# Patient Record
Sex: Male | Born: 1945 | Race: White | Hispanic: No | Marital: Married | Smoking: Former smoker
Health system: Southern US, Community
[De-identification: ages and names within clinical notes are randomized; demographics above are authoritative.]

## PROBLEM LIST (undated history)

## (undated) DIAGNOSIS — E119 Type 2 diabetes mellitus without complications: Secondary | ICD-10-CM

## (undated) DIAGNOSIS — I5022 Chronic systolic (congestive) heart failure: Secondary | ICD-10-CM

## (undated) DIAGNOSIS — Z95810 Presence of automatic (implantable) cardiac defibrillator: Secondary | ICD-10-CM

## (undated) DIAGNOSIS — Z789 Other specified health status: Secondary | ICD-10-CM

## (undated) DIAGNOSIS — K759 Inflammatory liver disease, unspecified: Secondary | ICD-10-CM

## (undated) DIAGNOSIS — J189 Pneumonia, unspecified organism: Secondary | ICD-10-CM

## (undated) DIAGNOSIS — R011 Cardiac murmur, unspecified: Secondary | ICD-10-CM

## (undated) DIAGNOSIS — H40009 Preglaucoma, unspecified, unspecified eye: Secondary | ICD-10-CM

## (undated) DIAGNOSIS — I10 Essential (primary) hypertension: Secondary | ICD-10-CM

## (undated) DIAGNOSIS — I509 Heart failure, unspecified: Secondary | ICD-10-CM

## (undated) DIAGNOSIS — I739 Peripheral vascular disease, unspecified: Secondary | ICD-10-CM

## (undated) DIAGNOSIS — M1990 Unspecified osteoarthritis, unspecified site: Secondary | ICD-10-CM

## (undated) DIAGNOSIS — I2510 Atherosclerotic heart disease of native coronary artery without angina pectoris: Secondary | ICD-10-CM

## (undated) DIAGNOSIS — C679 Malignant neoplasm of bladder, unspecified: Secondary | ICD-10-CM

---

## 2006-05-23 ENCOUNTER — Ambulatory Visit (HOSPITAL_COMMUNITY): Admission: RE | Admit: 2006-05-23 | Discharge: 2006-05-23 | Payer: Self-pay | Admitting: Gastroenterology

## 2010-07-26 ENCOUNTER — Other Ambulatory Visit: Payer: Self-pay

## 2010-07-26 ENCOUNTER — Other Ambulatory Visit (INDEPENDENT_AMBULATORY_CARE_PROVIDER_SITE_OTHER): Payer: Federal, State, Local not specified - PPO

## 2010-07-26 DIAGNOSIS — R0989 Other specified symptoms and signs involving the circulatory and respiratory systems: Secondary | ICD-10-CM

## 2010-07-26 HISTORY — PX: OTHER SURGICAL HISTORY: SHX169

## 2010-07-29 NOTE — Procedures (Unsigned)
CAROTID DUPLEX EXAM  INDICATION:  Left carotid bruit.  HISTORY: Diabetes:  Yes. Cardiac:  No. Hypertension:  Yes. Smoking:  Yes. Previous Surgery:  No. CV History:  Currently asymptomatic. Amaurosis Fugax No, Paresthesias No, Hemiparesis No. Other:  Hyperlipidemia.                                      RIGHT             LEFT Brachial systolic pressure:         115               112 Brachial Doppler waveforms:         Normal            Normal Vertebral direction of flow:        Antegrade         Antegrade DUPLEX VELOCITIES (cm/sec) CCA peak systolic                   87                91 ECA peak systolic                   257               281 ICA peak systolic                   92                87 ICA end diastolic                   30                32 PLAQUE MORPHOLOGY:                  Calcific          Calcific PLAQUE AMOUNT:                      Minimal           Minimal PLAQUE LOCATION:                    Bifurcation       ICA, ECA  IMPRESSION: 1. Bilateral internal carotid artery velocities suggest 1% to 39%     stenosis. 2. Bilateral external carotid artery stenosis. 3. Bilateral antegrade vertebral arteries.  ___________________________________________ Quita Skye Hart [place], M.D.  EM/MEDQ  D:  07/26/2010  T:  07/26/2010  Job:  [id number]

## 2011-05-16 ENCOUNTER — Other Ambulatory Visit: Payer: Self-pay | Admitting: Urology

## 2011-05-24 ENCOUNTER — Encounter (HOSPITAL_BASED_OUTPATIENT_CLINIC_OR_DEPARTMENT_OTHER): Payer: Self-pay | Admitting: *Deleted

## 2011-05-24 NOTE — Progress Notes (Signed)
NPO AFTER MN. ARRIVES AT 0945. NEEDS ISTAT AND EKG.  

## 2011-05-28 NOTE — H&P (Addendum)
History of Present Illness        New pt for gross hematuria. He first passed blood clots about a year ago. It usually clears in a few days. He had another episode of bleeding with clots and had right back pain in Nov 2012. He had an episode three days ago of gross hematuria with clots which cleared today. It lasted longer this last time. He has no pain today and is voiding well. No GU meds or surgery. No GU history. He smokes 1 ppd for about 40 yrs. No chemo or XRT exposure. No dye or solvent. Retired from [employer].  He voids with a good stream. He has no frequency or urgency. No dysuria.   UA today 21 - 50 rbc's per hpf, no infection   Nov 2012 PCP labs: PSA 0.28  C-Scope about 2 yrs ago.   Current Meds 1. Aspirin TABS; Therapy: (Recorded:15Jan2013) to 2. Lipitor 40 MG Oral Tablet; Therapy: (Recorded:15Jan2013) to 3. Lisinopril 10 MG Oral Tablet; Therapy: (Recorded:15Jan2013) to 4. Pioglitazone HCl TABS; Therapy: (Recorded:15Jan2013) to  Allergies Medication  1. No Known Drug Allergies  Review of Systems Genitourinary, constitutional, skin, eye, otolaryngeal, hematologic/lymphatic, cardiovascular, pulmonary, endocrine, musculoskeletal, gastrointestinal, neurological and psychiatric system(s) were reviewed and pertinent findings if present are noted.  ENT: sinus problems.    Vitals Vital Signs [Data Includes: Last 1 Day]  15Jan2013 12:36PM  BMI Calculated: 25.03 BSA Calculated: 2.15 Height: 6 ft 2 in Weight: 195 lb  Blood Pressure: 132 / 71 Temperature: 97 F Heart Rate: 76  Physical Exam Constitutional: Well nourished and well developed . No acute distress.  ENT:. The ears and nose are normal in appearance.  Neck: The appearance of the neck is normal and no neck mass is present.  Pulmonary: No respiratory distress and normal respiratory rhythm and effort.  Cardiovascular: Heart rate and rhythm are normal . No peripheral edema.  Abdomen: The abdomen is soft and nontender. No  masses are palpated. No CVA tenderness. No hernias are palpable. No hepatosplenomegaly noted.  Rectal: Rectal exam demonstrates normal sphincter tone, no tenderness and no masses. The prostate has no nodularity and is not tender. The left seminal vesicle is nonpalpable. The right seminal vesicle is nonpalpable. The perineum is normal on inspection.  Genitourinary: Examination of the penis demonstrates no discharge, no masses, no lesions and a normal meatus. The scrotum is without lesions. The right epididymis is palpably normal and non-tender. The left epididymis is palpably normal and non-tender. The right testis is non-tender and without masses. The left testis is non-tender and without masses.  Lymphatics: The femoral and inguinal nodes are not enlarged or tender.  Skin: Normal skin turgor, no visible rash and no visible skin lesions.  Neuro/Psych:. Mood and affect are appropriate.    Results/Data Urine [Data Includes: Last 1 Day]   15Jan2013  COLOR AMBER   APPEARANCE CLOUDY   SPECIFIC GRAVITY 1.010   pH 6.5   GLUCOSE NEG mg/dL  BILIRUBIN NEG   KETONE NEG mg/dL  BLOOD LARGE   PROTEIN 30 mg/dL  UROBILINOGEN 0.2 mg/dL  NITRITE NEG   LEUKOCYTE ESTERASE NEG   SQUAMOUS EPITHELIAL/HPF NONE SEEN   WBC 0-3 WBC/hpf  RBC 21-50 RBC/hpf  BACTERIA RARE   CRYSTALS NONE SEEN   CASTS NONE SEEN    Old records or history reviewed: 9 pages.    Procedure  Procedure: Cystoscopy  Indication: Hematuria.  Informed Consent: from the patient . Specific risks including, but not limited to bleeding, infection,  pain, allergic reaction etc. were explained.  Prep: The patient was prepped with betadine.  Antibiotic prophylaxis: Ciprofloxacin.  Procedure Note:  Urethral meatus:. No abnormalities.  Anterior urethra: No abnormalities.  Prostatic urethra: No abnormalities . The lateral and median prostatic lobes were enlarged.  Bladder: Visulization was clear. The ureteral orifices were in the normal  anatomic position bilaterally and had clear efflux of urine.  A papillary tumor was seen in the bladder measuring approximately 2 cm in size. This tumor was located on the left side, near the trigone of the bladder. Another papillary tumor was seen in the bladder measuring approximately 1 cm in size. This tumor was located on the left side, on the posterior aspect of the bladder. The patient tolerated the procedure well.  Complications: None.    Assessment Assessed  1. Health Maintenance V70.0 2. Gross Hematuria 599.71 3. 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 AU CT-HEMATURIA PROTOCOL  Requested for: 15Jan2013 2. COMPREHENSIVE METABOLIC PANEL  Done: 15Jan2013 3. Cysto  Done: 15Jan2013 4. Follow-up Schedule Surgery Office  Follow-up  Requested for: 15Jan2013 Health Maintenance (V70.0)  5. UA With REFLEX  Done: 15Jan2013 12:25PM  Discussion/Summary  Discussed cysto findings with patient and drew him a picture of the tumors/bladder. We discussed the nature, risks, benefits and alternatives to TURBT with mitomycin-C instillation. We also discussed the likelihood of achieving the goals of the procedure and potential problems that might occur during the procedure or recuperation. All questions answered. The patient elects to proceed as above. We also discussed the need for repeat TURBT at 6 weeks post-op if needed for staging.  H&P update: Pt seen and examined today. No change in H&P. CMP normal. Discussed CT - negative upper tracts and no mets, but tumor hovers over the left ureteral orifice (UO). I discussed again with the patient the nature, potential benefits, risks and alternatives to TURBT with post-op mitomycin-C, including side effects of the proposed treatment, the likelihood of the patient achieving the goals of the procedure, and any potential problems that might occur during the procedure or recuperation. We discussed I may have to resect the left UO and  there could be scar tissue formation and renal obstruction among other risks. We discussed he may need repeat TURBT/BBx for staging in the future. We discussed he may need to go home with foley. All questions answered. Patient elects to proceed as above.  I should add we also discussed he may need a left ureteral stent which would need to be removed in the future. Also, we discussed the nature of bladder cancer to recur and progress to higher stage.

## 2011-05-29 ENCOUNTER — Ambulatory Visit (HOSPITAL_BASED_OUTPATIENT_CLINIC_OR_DEPARTMENT_OTHER): Payer: Medicare Other | Admitting: Anesthesiology

## 2011-05-29 ENCOUNTER — Other Ambulatory Visit: Payer: Self-pay

## 2011-05-29 ENCOUNTER — Other Ambulatory Visit: Payer: Self-pay | Admitting: Urology

## 2011-05-29 ENCOUNTER — Encounter (HOSPITAL_BASED_OUTPATIENT_CLINIC_OR_DEPARTMENT_OTHER): Payer: Self-pay | Admitting: *Deleted

## 2011-05-29 ENCOUNTER — Ambulatory Visit (HOSPITAL_BASED_OUTPATIENT_CLINIC_OR_DEPARTMENT_OTHER)
Admission: RE | Admit: 2011-05-29 | Discharge: 2011-05-29 | Disposition: A | Discharge status: Home / Self Care | Payer: Medicare Other | Source: Ambulatory Visit | Attending: Urology | Admitting: Urology

## 2011-05-29 ENCOUNTER — Encounter (HOSPITAL_BASED_OUTPATIENT_CLINIC_OR_DEPARTMENT_OTHER)
Admission: RE | Disposition: A | Discharge status: Home / Self Care | Payer: Self-pay | Source: Ambulatory Visit | Attending: Urology

## 2011-05-29 ENCOUNTER — Encounter (HOSPITAL_BASED_OUTPATIENT_CLINIC_OR_DEPARTMENT_OTHER): Payer: Self-pay | Admitting: Anesthesiology

## 2011-05-29 DIAGNOSIS — Z79899 Other long term (current) drug therapy: Secondary | ICD-10-CM | POA: Insufficient documentation

## 2011-05-29 DIAGNOSIS — C679 Malignant neoplasm of bladder, unspecified: Secondary | ICD-10-CM | POA: Insufficient documentation

## 2011-05-29 DIAGNOSIS — R310 Gross hematuria: Secondary | ICD-10-CM | POA: Insufficient documentation

## 2011-05-29 DIAGNOSIS — Z7982 Long term (current) use of aspirin: Secondary | ICD-10-CM | POA: Insufficient documentation

## 2011-05-29 DIAGNOSIS — M549 Dorsalgia, unspecified: Secondary | ICD-10-CM | POA: Insufficient documentation

## 2011-05-29 HISTORY — DX: Preglaucoma, unspecified, unspecified eye: H40.009

## 2011-05-29 HISTORY — PX: TRANSURETHRAL RESECTION OF BLADDER TUMOR: SHX2575

## 2011-05-29 LAB — GLUCOSE, CAPILLARY: Glucose-Capillary: 80 mg/dL (ref 70–99)

## 2011-05-29 LAB — POCT I-STAT 4, (NA,K, GLUC, HGB,HCT): Sodium: 141 mEq/L (ref 135–145)

## 2011-05-29 SURGERY — TURBT (TRANSURETHRAL RESECTION OF BLADDER TUMOR)
Anesthesia: General | Site: Bladder | Wound class: Clean Contaminated

## 2011-05-29 MED ORDER — FENTANYL CITRATE 0.05 MG/ML IJ SOLN: 25.0000 ug | INTRAMUSCULAR | Status: DC | PRN

## 2011-05-29 MED ORDER — LACTATED RINGERS IV SOLN: INTRAVENOUS | Status: DC

## 2011-05-29 MED ORDER — PROMETHAZINE HCL 25 MG/ML IJ SOLN: 6.2500 mg | INTRAMUSCULAR | Status: DC | PRN

## 2011-05-29 MED ORDER — PHENAZOPYRIDINE HCL 200 MG PO TABS
200.0000 mg | ORAL_TABLET | Freq: Once | ORAL | Status: AC
  Administered 2011-05-29: 200 mg via ORAL

## 2011-05-29 MED ORDER — LACTATED RINGERS IV SOLN
INTRAVENOUS | Status: DC
  Administered 2011-05-29 (×2): via INTRAVENOUS

## 2011-05-29 MED ORDER — STERILE WATER FOR IRRIGATION IR SOLN
Status: DC | PRN
  Administered 2011-05-29: 3000 mL

## 2011-05-29 MED ORDER — PHENAZOPYRIDINE HCL 200 MG PO TABS: 200.0000 mg | ORAL_TABLET | Freq: Three times a day (TID) | ORAL | Status: AC | PRN

## 2011-05-29 MED ORDER — ONDANSETRON HCL 4 MG/2ML IJ SOLN
INTRAMUSCULAR | Status: DC | PRN
  Administered 2011-05-29: 4 mg via INTRAVENOUS

## 2011-05-29 MED ORDER — INDIGOTINDISULFONATE SODIUM 8 MG/ML IJ SOLN
INTRAMUSCULAR | Status: DC | PRN
  Administered 2011-05-29: 5 mL via INTRAVENOUS

## 2011-05-29 MED ORDER — SODIUM CHLORIDE 0.9 % IR SOLN
Status: DC | PRN
  Administered 2011-05-29: 6000 mL

## 2011-05-29 MED ORDER — MIDAZOLAM HCL 5 MG/5ML IJ SOLN
INTRAMUSCULAR | Status: DC | PRN
  Administered 2011-05-29: 2 mg via INTRAVENOUS

## 2011-05-29 MED ORDER — CIPROFLOXACIN HCL 500 MG PO TABS: 500.0000 mg | ORAL_TABLET | Freq: Two times a day (BID) | ORAL | Status: AC

## 2011-05-29 MED ORDER — MITOMYCIN CHEMO FOR BLADDER INSTILLATION 40 MG
40.0000 mg | Freq: Once | INTRAVENOUS | Status: AC
  Administered 2011-05-29: 40 mg via INTRAVESICAL
  Filled 2011-05-29: qty 40

## 2011-05-29 MED ORDER — FENTANYL CITRATE 0.05 MG/ML IJ SOLN
INTRAMUSCULAR | Status: DC | PRN
  Administered 2011-05-29 (×3): 50 ug via INTRAVENOUS

## 2011-05-29 MED ORDER — CEFAZOLIN SODIUM 1-5 GM-% IV SOLN
1.0000 g | INTRAVENOUS | Status: AC
  Administered 2011-05-29: 2 g via INTRAVENOUS

## 2011-05-29 MED ORDER — OXYCODONE-ACETAMINOPHEN 5-325 MG PO TABS: 1.0000 | ORAL_TABLET | ORAL | Status: AC | PRN

## 2011-05-29 MED ORDER — KETOROLAC TROMETHAMINE 30 MG/ML IJ SOLN
INTRAMUSCULAR | Status: DC | PRN
  Administered 2011-05-29: 30 mg via INTRAVENOUS

## 2011-05-29 MED ORDER — ASPIRIN 81 MG PO TABS: 160.0000 mg | ORAL_TABLET | Freq: Every day | ORAL | Status: DC

## 2011-05-29 MED ORDER — BELLADONNA ALKALOIDS-OPIUM 16.2-60 MG RE SUPP
RECTAL | Status: DC | PRN
  Administered 2011-05-29: 1 via RECTAL

## 2011-05-29 MED ORDER — LIDOCAINE HCL (CARDIAC) 20 MG/ML IV SOLN
INTRAVENOUS | Status: DC | PRN
  Administered 2011-05-29: 80 mg via INTRAVENOUS

## 2011-05-29 MED ORDER — PROPOFOL 10 MG/ML IV EMUL
INTRAVENOUS | Status: DC | PRN
  Administered 2011-05-29: 200 mg via INTRAVENOUS

## 2011-05-29 SURGICAL SUPPLY — 36 items
BAG DRAIN URO-CYSTO SKYTR STRL (DRAIN) ×2 IMPLANT
BAG DRN ANRFLXCHMBR STRAP LEK (BAG)
BAG DRN UROCATH (DRAIN) ×1
BAG URINE DRAINAGE (UROLOGICAL SUPPLIES) ×1 IMPLANT
BAG URINE LEG 19OZ MD ST LTX (BAG) IMPLANT
CANISTER SUCT LVC 12 LTR MEDI- (MISCELLANEOUS) IMPLANT
CATH FOLEY 2WAY SLVR  5CC 18FR (CATHETERS) ×1
CATH FOLEY 2WAY SLVR  5CC 20FR (CATHETERS)
CATH FOLEY 2WAY SLVR  5CC 22FR (CATHETERS)
CATH FOLEY 2WAY SLVR 5CC 18FR (CATHETERS) IMPLANT
CATH FOLEY 2WAY SLVR 5CC 20FR (CATHETERS) IMPLANT
CATH FOLEY 2WAY SLVR 5CC 22FR (CATHETERS) IMPLANT
CLOTH BEACON ORANGE TIMEOUT ST (SAFETY) ×2 IMPLANT
DRAPE CAMERA CLOSED 9X96 (DRAPES) ×2 IMPLANT
DRESSING TELFA 8X3 (GAUZE/BANDAGES/DRESSINGS) IMPLANT
ELECT BUTTON BIOP 24F 90D PLAS (MISCELLANEOUS) IMPLANT
ELECT LOOP HF 26F 30D .35MM (CUTTING LOOP) IMPLANT
ELECT LOOP MED HF 24F 12D CBL (CLIP) ×1 IMPLANT
ELECT REM PT RETURN 9FT ADLT (ELECTROSURGICAL) ×2
ELECTRODE REM PT RTRN 9FT ADLT (ELECTROSURGICAL) ×1 IMPLANT
EVACUATOR MICROVAS BLADDER (UROLOGICAL SUPPLIES) IMPLANT
GLOVE BIO SURGEON STRL SZ7.5 (GLOVE) ×2 IMPLANT
GLOVE BIOGEL PI IND STRL 6.5 (GLOVE) IMPLANT
GLOVE BIOGEL PI IND STRL 7.5 (GLOVE) IMPLANT
GLOVE BIOGEL PI INDICATOR 6.5 (GLOVE) ×1
GLOVE BIOGEL PI INDICATOR 7.5 (GLOVE) ×1
GLOVE ECLIPSE 6.5 STRL STRAW (GLOVE) ×1 IMPLANT
GLOVE SKINSENSE NS SZ7.5 (GLOVE) ×1
GLOVE SKINSENSE STRL SZ7.5 (GLOVE) IMPLANT
GOWN STRL REIN XL XLG (GOWN DISPOSABLE) ×2 IMPLANT
HOLDER FOLEY CATH W/STRAP (MISCELLANEOUS) IMPLANT
KIT ASPIRATION TUBING (SET/KITS/TRAYS/PACK) IMPLANT
LOOP CUTTING 24FR OLYMPUS (CUTTING LOOP) IMPLANT
PACK CYSTOSCOPY (CUSTOM PROCEDURE TRAY) ×2 IMPLANT
PLUG CATH AND CAP STER (CATHETERS) ×1 IMPLANT
WATER STERILE IRR 3000ML UROMA (IV SOLUTION) ×1 IMPLANT

## 2011-05-29 NOTE — Transfer of Care (Signed)
Immediate Anesthesia Transfer of Care Note  Patient: [name] [name]  Procedure(s) Performed:  TRANSURETHRAL RESECTION OF BLADDER TUMOR (TURBT)  Patient Location: PACU  Anesthesia Type: General  Level of Consciousness: sedated and responds to stimulation  Airway & Oxygen Therapy: Patient Spontanous Breathing and Patient connected to nasal cannula oxygen  Post-op Assessment: Report given to PACU RN  Post vital signs: Reviewed and stable  Complications: No apparent anesthesia complications

## 2011-05-29 NOTE — Anesthesia Procedure Notes (Signed)
Procedure Name: LMA Insertion Date/Time: 05/29/2011 11:40 AM Performed by: Maris Berger Pre-anesthesia Checklist: Patient identified, Emergency Drugs available, Suction available and Patient being monitored Patient Re-evaluated:Patient Re-evaluated prior to inductionOxygen Delivery Method: Circle System Utilized Preoxygenation: Pre-oxygenation with 100% oxygen Intubation Type: IV induction Ventilation: Mask ventilation without difficulty LMA: LMA with gastric port inserted LMA Size: 5.0 Number of attempts: 1 Placement Confirmation: positive ETCO2 Tube secured with: Tape Dental Injury: Teeth and Oropharynx as per pre-operative assessment

## 2011-05-29 NOTE — Anesthesia Preprocedure Evaluation (Addendum)
Anesthesia Evaluation  Patient identified by MRN, date of birth, ID band Patient awake    Reviewed: Allergy & Precautions, H&P , NPO status , Patient's Chart, lab work & pertinent test results  Airway Mallampati: II TM Distance: >3 FB Neck ROM: full    Dental No notable dental hx. (+) Teeth Intact and Dental Advisory Given   Pulmonary neg pulmonary ROS,  clear to auscultation  Pulmonary exam normal       Cardiovascular Exercise Tolerance: Good hypertension, On Medications neg cardio ROS regular Normal    Neuro/Psych Negative Neurological ROS  Negative Psych ROS   GI/Hepatic negative GI ROS, Neg liver ROS,   Endo/Other  Negative Endocrine ROSDiabetes mellitus-, Well Controlled, Type 2, Oral Hypoglycemic Agents  Renal/GU negative Renal ROS  Genitourinary negative   Musculoskeletal   Abdominal   Peds  Hematology negative hematology ROS (+)   Anesthesia Other Findings   Reproductive/Obstetrics negative OB ROS                          Anesthesia Physical Anesthesia Plan  ASA: III  Anesthesia Plan: General   Post-op Pain Management:    Induction: Intravenous  Airway Management Planned: Oral ETT  Additional Equipment:   Intra-op Plan:   Post-operative Plan: Extubation in OR  Informed Consent: I have reviewed the patients History and Physical, chart, labs and discussed the procedure including the risks, benefits and alternatives for the proposed anesthesia with the patient or authorized representative who has indicated his/her understanding and acceptance.   Dental Advisory Given  Plan Discussed with: CRNA and Surgeon  Anesthesia Plan Comments:        Anesthesia Quick Evaluation  

## 2011-05-29 NOTE — Brief Op Note (Signed)
05/29/2011  12:34 PM  PATIENT:  [name] [name]  66 y.o. male  PRE-OPERATIVE DIAGNOSIS:  bladder neoplasm  POST-OPERATIVE DIAGNOSIS:  bladder neoplasm  PROCEDURE:  Procedure(s): TRANSURETHRAL RESECTION OF BLADDER TUMOR (TURBT) - medium with post-op mitomycin-C instillation in PACU  SURGEON:  Surgeon(s): Antony Haste, MD   ANESTHESIA:   general  Findings - exam under anesthesia - normal, penis, testes and prostate without mass or nodule. No mass on bimanual exam.  Cysto - tumors along the left trigone and left floor of bladder: cluster of papillary tumor posterior (about 4 flat, early tumors), a larger papillary along lateral trigone, a largest papillary straddling the left UO.   EBL:  Minimal  BLOOD ADMINISTERED:none  DRAINS: 18 Fr foley   SPECIMEN: 1) Left posterior (left bladder floor) - cold cup x 2 and fulguration, 2) left lateral papillary tumor along trigone, 3) Left medial tumor - largest - straddled left UO  DISPOSITION OF SPECIMEN:  PATHOLOGY  COUNTS:  YES  TOURNIQUET:  * No tourniquets in log *  DICTATION: [id number]  PLAN OF CARE: Discharge to home after PACU  PATIENT DISPOSITION:  PACU - hemodynamically stable.   Delay start of Pharmacological VTE agent (>24hrs) due to surgical blood loss or risk of bleeding:  {YES/NO/NOT APPLICABLE:20182

## 2011-05-29 NOTE — Anesthesia Postprocedure Evaluation (Signed)
  Anesthesia Post-op Note  Patient: [name] [name]  Procedure(s) Performed:  TRANSURETHRAL RESECTION OF BLADDER TUMOR (TURBT)  Patient Location: PACU  Anesthesia Type: General  Level of Consciousness: awake and alert   Airway and Oxygen Therapy: Patient Spontanous Breathing  Post-op Pain: mild  Post-op Assessment: Post-op Vital signs reviewed, Patient's Cardiovascular Status Stable, Respiratory Function Stable, Patent Airway and No signs of Nausea or vomiting  Post-op Vital Signs: stable  Complications: No apparent anesthesia complications

## 2011-05-30 ENCOUNTER — Encounter (HOSPITAL_BASED_OUTPATIENT_CLINIC_OR_DEPARTMENT_OTHER): Payer: Self-pay | Admitting: Urology

## 2011-05-30 NOTE — Op Note (Signed)
NAME:  [name] [name], [name] [name] NO.:  [id number]  MEDICAL RECORD NO.:  [id number]  LOCATION:                               FACILITY:  [facility]  PHYSICIAN:  Jerilee Field, MD   DATE OF BIRTH:  11-01-1945  DATE OF PROCEDURE: DATE OF DISCHARGE:                              OPERATIVE REPORT   PREOPERATIVE DIAGNOSIS:  Bladder neoplasm.  POSTOPERATIVE DIAGNOSIS:  Bladder neoplasm.  PROCEDURE:  Exam under anesthesia with cystoscopy and transurethral resection of bladder tumor medium and postop mitomycin-c installation in PACU.  SURGEON:  Jerilee Field, M.D.  TYPE OF ANESTHESIA:  General.  INDICATION FOR PROCEDURE:  Mr. Ploch is a 66 year old, who presented with gross hematuria.  Cystoscopy in the office revealed papillary tumors among the left trigone and left side of the bladder.  CT scan revealed the large tumor straddled the left ureteral orifice.  He had a negative CT otherwise and a normal complete metabolic panel.  I discussed the findings with the patient.  We discussed the nature risks, benefits of transurethral resection of bladder tumor, and postop mitomycin-c instillation.  We also discussed possible resection of the left ureteral orifice and possible scar tissue formation with left kidney obstruction among other risks.  All questions were answered and the patient elects to proceed.  FINDINGS:  On exam under anesthesia, the patient had a normal penis, testicles without masses, and on rectal exam, the prostate was normal without masses or nodules in approximately 20 g.  He also had no abdominal masses on bimanual exam or palpable bladder masses.  On cystoscopy, utilizing a 12-degree and 70-degree lens, the bladder was examined in its entirety, and there were a cluster of tumors situated along the left trigone and left floor of the bladder.  There were a group of approximately for what appeared to be early flat papillary tumors along the left bladder  floor.  A larger more lateral papillary tumor along the left trigone and a more medial and the largest papillary tumor, which straddled the left ureteral orifice.  Despite looking with the scope and the loop the left ureteral orifice could never be located and the largest tumor was right on top of it.  There were no other tumors in the bladder.  The urethra was normal apart from a fairly tight fossa navicularis that would only accommodate 22-French and it was easily dilated to 28-French to allow passage of the 26-French resectoscope.  Remainder of the urethra and prostate were normal.  The bladder did have some mild trabeculation.  There were no stones or foreign bodies.  DESCRIPTION OF PROCEDURE:  After consent was obtained, the patient was brought to the operating room.  A time-out was performed to confirm the patient and the procedure.  After adequate anesthesia, he was placed in lithotomy position.  Preop antibiotics and SCDs were in place.  An exam under anesthesia was performed.  He was then prepped and draped in the usual fashion after a B and O suppository was inserted.  The 22-French cystoscope was used to carefully examine the bladder with previous findings.  Using the cystoscope, the flat papillary tumors on the left bladder floor were biopsied x2 with  cold cup biopsy forceps and sent to Pathology.  The bladder was then drained.  The scope was removed.  The 26-French resectoscope was then placed using the loose biopsy sites were fulgurated.  Next, the tumor along the left trigone that was more lateral was resected and sent separately.  Next, the largest tumor was resected and again inspection in front of and behind the tumor and confirm the ureteral orifice could not be located.  The tumor was resected and prior to coagulating for hemostasis, indigo was given. There was excellent efflux of blue from the left ureteral orifice, and once it was located, adequate hemostasis was  obtained with the coag. The area around the ureteral orifice had excellent hemostasis and was not coagulated.  The ureteral orifice was in the center of the resection site.  The patient's bladder was drained.  Again, excellent blue reflux was seen from the left and the right ureteral orifice.  There was excellent hemostasis.  The scope was then removed and an 18-French 2-way Foley was placed, left-to-gravity drainage, draining clear blue urine. The patient was then awakened and taken to the recovery room in stable condition.  ESTIMATED BLOOD LOSS:  Minimal.  COMPLICATIONS:  None.  DRAINS:  18-French Foley.  SPECIMENS: 1. Left posterior bladder biopsies (left bladder floor) - cold cup x2     and fulguration. 2. Left lateral papillary tumor along the trigone. 3. Left medial tumor, largest tumor was straddled the left ureteral     orifice.  DISPOSITION:  Specimens to Pathology.  The patient's disposition, the patient was taken to the recovery room in stable condition, and mitomycin will be instilled in the recovery room.          ______________________________ Jerilee Field, MD     ME/MEDQ  D:  05/29/2011  T:  05/30/2011  Job:  [id number]

## 2012-07-25 ENCOUNTER — Other Ambulatory Visit: Payer: Self-pay | Admitting: Urology

## 2012-07-25 MED ORDER — MITOMYCIN CHEMO FOR BLADDER INSTILLATION 40 MG: 40.0000 mg | Freq: Once | INTRAVENOUS | Status: DC

## 2012-07-28 ENCOUNTER — Encounter (HOSPITAL_BASED_OUTPATIENT_CLINIC_OR_DEPARTMENT_OTHER): Payer: Self-pay | Admitting: *Deleted

## 2012-07-28 NOTE — Progress Notes (Signed)
NPO AFTER MN. ARRIVES AT 0730. NEEDS ISTAT AND EKG.  

## 2012-07-31 NOTE — H&P (Signed)
History of Present Illness       F/u bladder cancer. Started with gross hematuria Nov 2012. No prior GU meds or surgery. He smokes 1 ppd for about 40 yrs. No chemo or XRT exposure. No dye or solvent. Retired from [employer]. Nov 2012 PCP labs: PSA 0.28  Bladder cancer prior dx and treatment  --Jan 2013 TURBT, post-op [facility] - HG Ta multifocal on left with muscle in one specimen and negative. Left UO resected. Renal US post-op normal - no hydro. CT Hematuria Jan 2013 with negative upper tracts.  --Apr 2013 completed induction BCG x 6 --Jun 2013 cystoscopy normal  -Sept 2013 cystoscopy normal -Sept 2013 completed BCG maintenance -Dec 2013 - normal cystoscopy, normal DRE   Interval Hx He returns for cystoscopy. He is due for BCG maintenance. He needs upper tract imaging.       Past Medical History Problems  1. History of  Diabetes Mellitus 250.00 2. History of  Glaucoma 365.9 3. History of  Hepatitis 573.3 4. History of  Hypercholesterolemia 272.0 5. History of  Hypertension 401.9  Surgical History Problems  1. History of  Cystoscopy With Biopsy 2. History of  Cystoscopy With Fulguration Medium Lesion (2-5cm) 3. History of  No Surgical Problems  Current Meds 1. Aspirin TABS; Therapy: (Recorded:15Jan2013) to 2. Atorvastatin Calcium 20 MG Oral Tablet; Therapy: 18Jun2013 to 3. Lisinopril 20 MG Oral Tablet; Therapy: 30Jan2013 to 4. Pioglitazone HCl TABS; Therapy: (Recorded:15Jan2013) to 5. Pioglitazone HCl-Metformin HCl 15-500 MG Oral Tablet; Therapy: 30Jan2013 to  Allergies Medication  1. No Known Drug Allergies  Family History Problems  1. Family history of  Death In The Family Father 2. Family history of  Family Health Status - Mother's Age age 3 3. Family history of  Family Health Status Number Of Children 2 sons 4. Paternal history of  Nonfunctioning Kidney  Social History Problems  1. Caffeine Use 8-10 per day 2. Current Smoker 305.1 smokes 1ppd for 34yrs 3. Marital  History - Currently Married 4. Retired From Work Denied  5. History of  Alcohol Use  Review of Systems Constitutional, cardiovascular and pulmonary system(s) were reviewed and pertinent findings if present are noted.  Constitutional: recent weight gain.    Vitals Vital Signs [Data Includes: Last 1 Day]  25Mar2014 03:24PM  Blood Pressure: 138 / 80 Temperature: 98.2 F Heart Rate: 69  Physical Exam Constitutional: Well nourished and well developed . No acute distress.  Pulmonary: No respiratory distress and normal respiratory rhythm and effort.  Cardiovascular: Heart rate and rhythm are normal . No peripheral edema.  Neuro/Psych:. Mood and affect are appropriate.    Results/Data Urine [Data Includes: Last 1 Day]   25Mar2014  COLOR YELLOW   APPEARANCE CLEAR   SPECIFIC GRAVITY 1.010   pH 6.0   GLUCOSE NEG mg/dL  BILIRUBIN NEG   KETONE NEG mg/dL  BLOOD NEG   PROTEIN NEG mg/dL  UROBILINOGEN 0.2 mg/dL  NITRITE NEG   LEUKOCYTE ESTERASE NEG    Procedure  Procedure: Cystoscopy   Indication: History of Urothelial Carcinoma.  Informed Consent: Risks, benefits, and potential adverse events were discussed and informed consent was obtained from the patient.  Prep: The patient was prepped with betadine.  Procedure Note:  Urethral meatus:. No abnormalities.  Anterior urethra: No abnormalities.  Prostatic urethra: No abnormalities.  Bladder: Visulization was clear. The ureteral orifices were in the normal anatomic position bilaterally and had clear efflux of urine. A papillary tumor was seen in the bladder. This tumor was  located on the right side, at the base of the bladder. The patient tolerated the procedure well.  Complications: None.    Assessment Assessed  1. Malignant Neoplasm Of The Bladder 188.9   Small papillary recurrence   Plan Malignant Neoplasm Of The Bladder (188.9)  1. AU CT-HEMATURIA PROTOCOL  Requested for: 25Mar2014 2. BUN & CREATININE  Requested for:  25Mar2014 3. Follow-up Schedule Surgery Office  Follow-up  Requested for: 25Mar2014  Discussion/Summary     Discussed cysto, bladder bx and [facility] instillation - nature, R/B/A. All questions answered. He elects to proceed. Will also send a BUN/Cr to assess bladder and upper tracts.      Signatures Electronically signed by : Jerilee Field, M.D.; Jul 22 2012  4:03PM

## 2012-08-01 ENCOUNTER — Encounter (HOSPITAL_BASED_OUTPATIENT_CLINIC_OR_DEPARTMENT_OTHER)
Admission: RE | Disposition: A | Discharge status: Home / Self Care | Payer: Self-pay | Source: Ambulatory Visit | Attending: Urology

## 2012-08-01 ENCOUNTER — Ambulatory Visit (HOSPITAL_BASED_OUTPATIENT_CLINIC_OR_DEPARTMENT_OTHER)
Admission: RE | Admit: 2012-08-01 | Discharge: 2012-08-01 | Disposition: A | Discharge status: Home / Self Care | Payer: Medicare Other | Source: Ambulatory Visit | Attending: Urology | Admitting: Urology

## 2012-08-01 ENCOUNTER — Encounter (HOSPITAL_BASED_OUTPATIENT_CLINIC_OR_DEPARTMENT_OTHER): Payer: Self-pay | Admitting: Anesthesiology

## 2012-08-01 ENCOUNTER — Ambulatory Visit (HOSPITAL_BASED_OUTPATIENT_CLINIC_OR_DEPARTMENT_OTHER): Payer: Medicare Other | Admitting: Anesthesiology

## 2012-08-01 ENCOUNTER — Other Ambulatory Visit: Payer: Self-pay

## 2012-08-01 DIAGNOSIS — F17200 Nicotine dependence, unspecified, uncomplicated: Secondary | ICD-10-CM | POA: Insufficient documentation

## 2012-08-01 DIAGNOSIS — E7800 Pure hypercholesterolemia, unspecified: Secondary | ICD-10-CM | POA: Insufficient documentation

## 2012-08-01 DIAGNOSIS — E119 Type 2 diabetes mellitus without complications: Secondary | ICD-10-CM | POA: Insufficient documentation

## 2012-08-01 DIAGNOSIS — Z79899 Other long term (current) drug therapy: Secondary | ICD-10-CM | POA: Insufficient documentation

## 2012-08-01 DIAGNOSIS — N400 Enlarged prostate without lower urinary tract symptoms: Secondary | ICD-10-CM | POA: Insufficient documentation

## 2012-08-01 DIAGNOSIS — I10 Essential (primary) hypertension: Secondary | ICD-10-CM | POA: Insufficient documentation

## 2012-08-01 DIAGNOSIS — Z7982 Long term (current) use of aspirin: Secondary | ICD-10-CM | POA: Insufficient documentation

## 2012-08-01 DIAGNOSIS — C679 Malignant neoplasm of bladder, unspecified: Secondary | ICD-10-CM | POA: Insufficient documentation

## 2012-08-01 HISTORY — PX: FULGURATION OF BLADDER TUMOR: SHX6261

## 2012-08-01 HISTORY — DX: Malignant neoplasm of bladder, unspecified: C67.9

## 2012-08-01 HISTORY — PX: CYSTOSCOPY WITH BIOPSY: SHX5122

## 2012-08-01 LAB — POCT I-STAT 4, (NA,K, GLUC, HGB,HCT)
Hemoglobin: 15.3 g/dL (ref 13.0–17.0)
Sodium: 143 mEq/L (ref 135–145)

## 2012-08-01 SURGERY — CYSTOSCOPY, WITH BIOPSY
Anesthesia: General | Site: Bladder | Wound class: Clean Contaminated

## 2012-08-01 MED ORDER — OXYCODONE HCL 5 MG PO TABS
5.0000 mg | ORAL_TABLET | Freq: Once | ORAL | Status: DC | PRN
  Filled 2012-08-01: qty 1

## 2012-08-01 MED ORDER — MEPERIDINE HCL 25 MG/ML IJ SOLN
6.2500 mg | INTRAMUSCULAR | Status: DC | PRN
  Filled 2012-08-01: qty 1

## 2012-08-01 MED ORDER — ONDANSETRON HCL 4 MG/2ML IJ SOLN
INTRAMUSCULAR | Status: DC | PRN
  Administered 2012-08-01: 4 mg via INTRAVENOUS

## 2012-08-01 MED ORDER — HYDROMORPHONE HCL PF 1 MG/ML IJ SOLN
0.2500 mg | INTRAMUSCULAR | Status: DC | PRN
  Filled 2012-08-01: qty 1

## 2012-08-01 MED ORDER — CEFAZOLIN SODIUM-DEXTROSE 2-3 GM-% IV SOLR
2.0000 g | INTRAVENOUS | Status: AC
  Administered 2012-08-01: 2 g via INTRAVENOUS
  Filled 2012-08-01: qty 50

## 2012-08-01 MED ORDER — CEFAZOLIN SODIUM 1-5 GM-% IV SOLN
1.0000 g | INTRAVENOUS | Status: DC
  Filled 2012-08-01: qty 50

## 2012-08-01 MED ORDER — MIDAZOLAM HCL 5 MG/5ML IJ SOLN
INTRAMUSCULAR | Status: DC | PRN
  Administered 2012-08-01: 2 mg via INTRAVENOUS

## 2012-08-01 MED ORDER — ACETAMINOPHEN 10 MG/ML IV SOLN
1000.0000 mg | Freq: Once | INTRAVENOUS | Status: DC | PRN
  Filled 2012-08-01: qty 100

## 2012-08-01 MED ORDER — LIDOCAINE HCL (CARDIAC) 20 MG/ML IV SOLN
INTRAVENOUS | Status: DC | PRN
  Administered 2012-08-01: 100 mg via INTRAVENOUS

## 2012-08-01 MED ORDER — PHENAZOPYRIDINE HCL 200 MG PO TABS: 200.0000 mg | ORAL_TABLET | Freq: Three times a day (TID) | ORAL | Status: DC | PRN

## 2012-08-01 MED ORDER — PROPOFOL 10 MG/ML IV BOLUS
INTRAVENOUS | Status: DC | PRN
  Administered 2012-08-01: 200 mg via INTRAVENOUS

## 2012-08-01 MED ORDER — DEXAMETHASONE SODIUM PHOSPHATE 4 MG/ML IJ SOLN
INTRAMUSCULAR | Status: DC | PRN
  Administered 2012-08-01: 8 mg via INTRAVENOUS

## 2012-08-01 MED ORDER — PROMETHAZINE HCL 25 MG/ML IJ SOLN
6.2500 mg | INTRAMUSCULAR | Status: DC | PRN
  Filled 2012-08-01: qty 1

## 2012-08-01 MED ORDER — ACETAMINOPHEN 10 MG/ML IV SOLN
INTRAVENOUS | Status: DC | PRN
  Administered 2012-08-01: 1000 mg via INTRAVENOUS

## 2012-08-01 MED ORDER — FENTANYL CITRATE 0.05 MG/ML IJ SOLN
INTRAMUSCULAR | Status: DC | PRN
  Administered 2012-08-01: 50 ug via INTRAVENOUS
  Administered 2012-08-01 (×2): 25 ug via INTRAVENOUS

## 2012-08-01 MED ORDER — LACTATED RINGERS IV SOLN
INTRAVENOUS | Status: DC
  Administered 2012-08-01 (×2): via INTRAVENOUS
  Filled 2012-08-01: qty 1000

## 2012-08-01 MED ORDER — STERILE WATER FOR IRRIGATION IR SOLN
Status: DC | PRN
  Administered 2012-08-01: 3000 mL

## 2012-08-01 MED ORDER — OXYCODONE HCL 5 MG/5ML PO SOLN
5.0000 mg | Freq: Once | ORAL | Status: DC | PRN
  Filled 2012-08-01: qty 5

## 2012-08-01 SURGICAL SUPPLY — 35 items
BAG DRAIN URO-CYSTO SKYTR STRL (DRAIN) ×3 IMPLANT
BAG DRN ANRFLXCHMBR STRAP LEK (BAG)
BAG DRN UROCATH (DRAIN) ×2
BAG URINE DRAINAGE (UROLOGICAL SUPPLIES) ×3 IMPLANT
BAG URINE LEG 19OZ MD ST LTX (BAG) IMPLANT
BLADE SURG 15 STRL LF DISP TIS (BLADE) IMPLANT
BLADE SURG 15 STRL SS (BLADE)
CANISTER SUCT LVC 12 LTR MEDI- (MISCELLANEOUS) ×1 IMPLANT
CATH FOLEY 3WAY 30CC 22F (CATHETERS) ×3 IMPLANT
CATH HEMA 3WAY 30CC 24FR COUDE (CATHETERS) IMPLANT
CATH HEMA 3WAY 30CC 24FR RND (CATHETERS) IMPLANT
CLOTH BEACON ORANGE TIMEOUT ST (SAFETY) ×3 IMPLANT
DRAPE CAMERA CLOSED 9X96 (DRAPES) ×3 IMPLANT
ELECT BUTTON BIOP 24F 90D PLAS (MISCELLANEOUS) IMPLANT
ELECT LOOP HF 26F 30D .35MM (CUTTING LOOP) IMPLANT
ELECT REM PT RETURN 9FT ADLT (ELECTROSURGICAL) ×3
ELECTRODE REM PT RTRN 9FT ADLT (ELECTROSURGICAL) ×2 IMPLANT
EVACUATOR MICROVAS BLADDER (UROLOGICAL SUPPLIES) IMPLANT
GLOVE BIO SURGEON STRL SZ 6.5 (GLOVE) ×1 IMPLANT
GLOVE BIO SURGEON STRL SZ7.5 (GLOVE) ×5 IMPLANT
GLOVE BIOGEL M STER SZ 6 (GLOVE) ×1 IMPLANT
GOWN STRL NON-REIN LRG LVL3 (GOWN DISPOSABLE) ×1 IMPLANT
GOWN STRL REIN XL XLG (GOWN DISPOSABLE) ×4 IMPLANT
HOLDER FOLEY CATH W/STRAP (MISCELLANEOUS) IMPLANT
KIT ASPIRATION TUBING (SET/KITS/TRAYS/PACK) IMPLANT
KIT SUPRAPUBIC CATH (MISCELLANEOUS) IMPLANT
LOOP CUTTING 24FR OLYMPUS (CUTTING LOOP) IMPLANT
NEEDLE HYPO 22GX1.5 SAFETY (NEEDLE) IMPLANT
NS IRRIG 500ML POUR BTL (IV SOLUTION) IMPLANT
PACK CYSTOSCOPY (CUSTOM PROCEDURE TRAY) ×3 IMPLANT
PLUG CATH AND CAP STER (CATHETERS) IMPLANT
SET ASPIRATION TUBING (TUBING) IMPLANT
SUT ETHILON 3 0 PS 1 (SUTURE) IMPLANT
SYR 30ML LL (SYRINGE) ×3 IMPLANT
WATER STERILE IRR 3000ML UROMA (IV SOLUTION) ×3 IMPLANT

## 2012-08-01 NOTE — Op Note (Signed)
Preoperative diagnosis: Bladder cancer Postoperative diagnosis: Bladder cancer  Procedure: Exam under anesthesia Cystoscopy bladder biopsy and fulguration  Surgeon: Mena Goes  Anesthesia: Gen.  Findings: On exam under anesthesia the penis was circumcised and without lesion. Testicles were descended bilaterally without mass. On digital rectal exam the prostate had mild BPH but was smooth without hard area or nodule.  On cystoscopy the urethra was normal. The prostatic urethra had trilobar hypertrophy with an elevated bladder neck but otherwise normal. The trigone and ureteral orifice these were in their normal orthotopic position. The bladder and moderate trabeculation. A few centimeters superior and lateral to the right ureteral orifice there was a small superficial papillary tumor. Inferior to this there was just some subtle changes to the mucosa possible tumor. Otherwise the bladder mucosa was completely normal. There was good clear reflux from both ureteral orifice these.  Description of procedure: After consent was obtained patient was brought to the operating room. A timeout was performed to confirm the patient and procedure. Patient was prepped and draped in the usual sterile fashion. An exam under anesthesia was performed. Gloves were changed and new gown put on. Cystoscope was passed per urethra and the bladder examined with a 12 and 70 lens. The abnormal mucosa was biopsied inferior to the tumor. Next the actual tumor was biopsied. The areas were fulgurated.  the right ureteral orifice was not encountered and continued to have good clear reflux.  The bladder was drained and the scope removed.  The papillary tumor was small and I felt the risk of urinary frequency urgency and bladder scarring with mitomycin outweighed its benefit in this situation therefore I did not give it.  Patient was awakened taken to recovery room in stable condition.  Complications: None  Blood loss:  Minimal  Drains: None  Specimens: #1 right posterior inferior biopsy - abnormal mucosa #2 right posterior superior biopsy - bladder tumor Specimens to pathology  Disposition of patient: Stable to PACU.

## 2012-08-01 NOTE — Interval H&P Note (Signed)
History and Physical Interval Note:  08/01/2012 8:22 AM  [name] [name]  has presented today for surgery, with the diagnosis of Bladder Cancer  The various methods of treatment have been discussed with the patient and family. After consideration of risks, benefits and other options for treatment, the patient has consented to  Procedure(s): CYSTOSCOPY WITH BIOPSY BLADDER BIOPSY INSTILLATION OF MITOMYCIN C   (N/A) FULGURATION OF BLADDER TUMOR (N/A) as a surgical intervention .  The patient's history has been reviewed, patient examined, no change in status, stable for surgery.  I have reviewed the patient's chart and labs.  Questions were answered to the patient's satisfaction.  CT images reviewed and stable. Discussed again with patient importance of stopping smoking. Istat stable. Discussed may or may not use [facility] depending on size and depth of lesion and the R/B of MMC.   Antony Haste

## 2012-08-01 NOTE — Anesthesia Procedure Notes (Signed)
Procedure Name: LMA Insertion Performed by: Briant Sites Pre-anesthesia Checklist: Patient identified, Emergency Drugs available, Suction available and Patient being monitored Patient Re-evaluated:Patient Re-evaluated prior to inductionOxygen Delivery Method: Circle System Utilized Preoxygenation: Pre-oxygenation with 100% oxygen Intubation Type: IV induction Ventilation: Mask ventilation without difficulty LMA: LMA inserted LMA Size: 4.0 Number of attempts: 1 Airway Equipment and Method: bite block Placement Confirmation: positive ETCO2 Dental Injury: Teeth and Oropharynx as per pre-operative assessment

## 2012-08-01 NOTE — Anesthesia Preprocedure Evaluation (Addendum)
Anesthesia Evaluation  Patient identified by MRN, date of birth, ID band Patient awake    Reviewed: Allergy & Precautions, H&P , NPO status , Patient's Chart, lab work & pertinent test results  Airway Mallampati: II TM Distance: >3 FB Neck ROM: full    Dental no notable dental hx. (+) Teeth Intact and Dental Advisory Given   Pulmonary neg pulmonary ROS,  breath sounds clear to auscultation  Pulmonary exam normal       Cardiovascular Exercise Tolerance: Good hypertension, On Medications negative cardio ROS  Rhythm:regular Rate:Normal     Neuro/Psych negative neurological ROS  negative psych ROS   GI/Hepatic negative GI ROS, Neg liver ROS,   Endo/Other  diabetes, Type 2, Oral Hypoglycemic Agents  Renal/GU negative Renal ROS     Musculoskeletal   Abdominal   Peds  Hematology negative hematology ROS (+)   Anesthesia Other Findings   Reproductive/Obstetrics                           Anesthesia Physical  Anesthesia Plan  ASA: III  Anesthesia Plan: General   Post-op Pain Management:    Induction: Intravenous  Airway Management Planned: LMA  Additional Equipment:   Intra-op Plan:   Post-operative Plan: Extubation in OR  Informed Consent: I have reviewed the patients History and Physical, chart, labs and discussed the procedure including the risks, benefits and alternatives for the proposed anesthesia with the patient or authorized representative who has indicated his/her understanding and acceptance.   Dental Advisory Given  Plan Discussed with: CRNA  Anesthesia Plan Comments:        Anesthesia Quick Evaluation

## 2012-08-01 NOTE — Anesthesia Postprocedure Evaluation (Signed)
Anesthesia Post Note  Patient: [name] [name]  Procedure(s) Performed: Procedure(s) (LRB): CYSTOSCOPY WITH BIOPSY BLADDER BIOPSY    (N/A) FULGURATION OF BLADDER TUMOR (N/A)  Anesthesia type: General  Patient location: PACU  Post pain: Pain level controlled  Post assessment: Post-op Vital signs reviewed  Last Vitals: BP 141/56  Pulse 61  Temp(Src) 36.4 C (Oral)  Resp 11  Ht 6\' 2"  (1.88 m)  Wt 205 lb (92.987 kg)  BMI 26.31 kg/m2  SpO2 98%  Post vital signs: Reviewed  Level of consciousness: sedated  Complications: No apparent anesthesia complications

## 2012-08-01 NOTE — Transfer of Care (Signed)
Immediate Anesthesia Transfer of Care Note  Patient: [name] [name]  Procedure(s) Performed: Procedure(s) (LRB): CYSTOSCOPY WITH BIOPSY BLADDER BIOPSY    (N/A) FULGURATION OF BLADDER TUMOR (N/A)  Patient Location: PACU  Anesthesia Type: General  Level of Consciousness: drowsy  Airway & Oxygen Therapy: Patient Spontanous Breathing and Patient connected to face mask oxygen  Post-op Assessment: Report given to PACU RN and Post -op Vital signs reviewed and stable  Post vital signs: Reviewed and stable  Complications: No apparent anesthesia complications

## 2012-08-04 ENCOUNTER — Encounter (HOSPITAL_BASED_OUTPATIENT_CLINIC_OR_DEPARTMENT_OTHER): Payer: Self-pay | Admitting: Urology

## 2017-03-31 ENCOUNTER — Inpatient Hospital Stay (HOSPITAL_COMMUNITY): Payer: Medicare Other

## 2017-03-31 ENCOUNTER — Encounter (HOSPITAL_COMMUNITY)
Admission: EM | Disposition: A | Discharge status: Home Health | Payer: Self-pay | Source: Home / Self Care | Attending: Surgery

## 2017-03-31 ENCOUNTER — Emergency Department (HOSPITAL_COMMUNITY): Payer: Medicare Other

## 2017-03-31 ENCOUNTER — Encounter (HOSPITAL_COMMUNITY): Payer: Self-pay

## 2017-03-31 ENCOUNTER — Inpatient Hospital Stay (HOSPITAL_COMMUNITY)
Admission: EM | Admit: 2017-03-31 | Discharge: 2017-04-18 | DRG: 215 | Disposition: A | Discharge status: Home Health | Payer: Medicare Other | Attending: Surgery | Admitting: Surgery

## 2017-03-31 ENCOUNTER — Other Ambulatory Visit: Payer: Self-pay

## 2017-03-31 DIAGNOSIS — I272 Pulmonary hypertension, unspecified: Secondary | ICD-10-CM | POA: Diagnosis present

## 2017-03-31 DIAGNOSIS — J9622 Acute and chronic respiratory failure with hypercapnia: Secondary | ICD-10-CM

## 2017-03-31 DIAGNOSIS — I5021 Acute systolic (congestive) heart failure: Secondary | ICD-10-CM | POA: Diagnosis present

## 2017-03-31 DIAGNOSIS — Z7982 Long term (current) use of aspirin: Secondary | ICD-10-CM

## 2017-03-31 DIAGNOSIS — D72829 Elevated white blood cell count, unspecified: Secondary | ICD-10-CM | POA: Diagnosis not present

## 2017-03-31 DIAGNOSIS — J9601 Acute respiratory failure with hypoxia: Secondary | ICD-10-CM | POA: Diagnosis not present

## 2017-03-31 DIAGNOSIS — I5041 Acute combined systolic (congestive) and diastolic (congestive) heart failure: Secondary | ICD-10-CM

## 2017-03-31 DIAGNOSIS — I252 Old myocardial infarction: Secondary | ICD-10-CM

## 2017-03-31 DIAGNOSIS — E871 Hypo-osmolality and hyponatremia: Secondary | ICD-10-CM | POA: Diagnosis present

## 2017-03-31 DIAGNOSIS — N390 Urinary tract infection, site not specified: Secondary | ICD-10-CM | POA: Diagnosis not present

## 2017-03-31 DIAGNOSIS — I2109 ST elevation (STEMI) myocardial infarction involving other coronary artery of anterior wall: Secondary | ICD-10-CM | POA: Diagnosis present

## 2017-03-31 DIAGNOSIS — A419 Sepsis, unspecified organism: Secondary | ICD-10-CM | POA: Diagnosis not present

## 2017-03-31 DIAGNOSIS — R609 Edema, unspecified: Secondary | ICD-10-CM

## 2017-03-31 DIAGNOSIS — J90 Pleural effusion, not elsewhere classified: Secondary | ICD-10-CM

## 2017-03-31 DIAGNOSIS — I110 Hypertensive heart disease with heart failure: Secondary | ICD-10-CM | POA: Diagnosis present

## 2017-03-31 DIAGNOSIS — Z951 Presence of aortocoronary bypass graft: Secondary | ICD-10-CM

## 2017-03-31 DIAGNOSIS — R6521 Severe sepsis with septic shock: Secondary | ICD-10-CM | POA: Diagnosis not present

## 2017-03-31 DIAGNOSIS — I480 Paroxysmal atrial fibrillation: Secondary | ICD-10-CM | POA: Diagnosis not present

## 2017-03-31 DIAGNOSIS — I25110 Atherosclerotic heart disease of native coronary artery with unstable angina pectoris: Secondary | ICD-10-CM | POA: Diagnosis not present

## 2017-03-31 DIAGNOSIS — J441 Chronic obstructive pulmonary disease with (acute) exacerbation: Secondary | ICD-10-CM | POA: Diagnosis not present

## 2017-03-31 DIAGNOSIS — E119 Type 2 diabetes mellitus without complications: Secondary | ICD-10-CM | POA: Diagnosis present

## 2017-03-31 DIAGNOSIS — N179 Acute kidney failure, unspecified: Secondary | ICD-10-CM | POA: Diagnosis not present

## 2017-03-31 DIAGNOSIS — D696 Thrombocytopenia, unspecified: Secondary | ICD-10-CM | POA: Diagnosis not present

## 2017-03-31 DIAGNOSIS — J440 Chronic obstructive pulmonary disease with acute lower respiratory infection: Secondary | ICD-10-CM | POA: Diagnosis not present

## 2017-03-31 DIAGNOSIS — E876 Hypokalemia: Secondary | ICD-10-CM | POA: Diagnosis not present

## 2017-03-31 DIAGNOSIS — R740 Nonspecific elevation of levels of transaminase and lactic acid dehydrogenase [LDH]: Secondary | ICD-10-CM | POA: Diagnosis not present

## 2017-03-31 DIAGNOSIS — R0600 Dyspnea, unspecified: Secondary | ICD-10-CM

## 2017-03-31 DIAGNOSIS — F419 Anxiety disorder, unspecified: Secondary | ICD-10-CM | POA: Diagnosis not present

## 2017-03-31 DIAGNOSIS — B965 Pseudomonas (aeruginosa) (mallei) (pseudomallei) as the cause of diseases classified elsewhere: Secondary | ICD-10-CM | POA: Diagnosis not present

## 2017-03-31 DIAGNOSIS — Z8551 Personal history of malignant neoplasm of bladder: Secondary | ICD-10-CM

## 2017-03-31 DIAGNOSIS — Z09 Encounter for follow-up examination after completed treatment for conditions other than malignant neoplasm: Secondary | ICD-10-CM

## 2017-03-31 DIAGNOSIS — J383 Other diseases of vocal cords: Secondary | ICD-10-CM | POA: Diagnosis present

## 2017-03-31 DIAGNOSIS — Z01810 Encounter for preprocedural cardiovascular examination: Secondary | ICD-10-CM | POA: Diagnosis not present

## 2017-03-31 DIAGNOSIS — I440 Atrioventricular block, first degree: Secondary | ICD-10-CM | POA: Diagnosis not present

## 2017-03-31 DIAGNOSIS — I10 Essential (primary) hypertension: Secondary | ICD-10-CM | POA: Diagnosis present

## 2017-03-31 DIAGNOSIS — R570 Cardiogenic shock: Secondary | ICD-10-CM | POA: Diagnosis not present

## 2017-03-31 DIAGNOSIS — K219 Gastro-esophageal reflux disease without esophagitis: Secondary | ICD-10-CM | POA: Diagnosis present

## 2017-03-31 DIAGNOSIS — Z79899 Other long term (current) drug therapy: Secondary | ICD-10-CM

## 2017-03-31 DIAGNOSIS — J189 Pneumonia, unspecified organism: Secondary | ICD-10-CM | POA: Diagnosis not present

## 2017-03-31 DIAGNOSIS — Y95 Nosocomial condition: Secondary | ICD-10-CM | POA: Diagnosis not present

## 2017-03-31 DIAGNOSIS — I2510 Atherosclerotic heart disease of native coronary artery without angina pectoris: Secondary | ICD-10-CM | POA: Diagnosis present

## 2017-03-31 DIAGNOSIS — F17210 Nicotine dependence, cigarettes, uncomplicated: Secondary | ICD-10-CM | POA: Diagnosis present

## 2017-03-31 DIAGNOSIS — R062 Wheezing: Secondary | ICD-10-CM | POA: Diagnosis not present

## 2017-03-31 DIAGNOSIS — Z8249 Family history of ischemic heart disease and other diseases of the circulatory system: Secondary | ICD-10-CM

## 2017-03-31 DIAGNOSIS — Z452 Encounter for adjustment and management of vascular access device: Secondary | ICD-10-CM

## 2017-03-31 DIAGNOSIS — I361 Nonrheumatic tricuspid (valve) insufficiency: Secondary | ICD-10-CM | POA: Diagnosis not present

## 2017-03-31 DIAGNOSIS — R319 Hematuria, unspecified: Secondary | ICD-10-CM | POA: Diagnosis not present

## 2017-03-31 DIAGNOSIS — Z95811 Presence of heart assist device: Secondary | ICD-10-CM | POA: Diagnosis not present

## 2017-03-31 DIAGNOSIS — Z716 Tobacco abuse counseling: Secondary | ICD-10-CM

## 2017-03-31 DIAGNOSIS — R509 Fever, unspecified: Secondary | ICD-10-CM | POA: Diagnosis not present

## 2017-03-31 DIAGNOSIS — J9690 Respiratory failure, unspecified, unspecified whether with hypoxia or hypercapnia: Secondary | ICD-10-CM

## 2017-03-31 DIAGNOSIS — D62 Acute posthemorrhagic anemia: Secondary | ICD-10-CM | POA: Diagnosis not present

## 2017-03-31 DIAGNOSIS — Z833 Family history of diabetes mellitus: Secondary | ICD-10-CM

## 2017-03-31 DIAGNOSIS — I213 ST elevation (STEMI) myocardial infarction of unspecified site: Secondary | ICD-10-CM | POA: Diagnosis present

## 2017-03-31 DIAGNOSIS — I255 Ischemic cardiomyopathy: Secondary | ICD-10-CM | POA: Diagnosis present

## 2017-03-31 DIAGNOSIS — N433 Hydrocele, unspecified: Secondary | ICD-10-CM | POA: Diagnosis present

## 2017-03-31 HISTORY — PX: LEFT HEART CATH AND CORONARY ANGIOGRAPHY: CATH118249

## 2017-03-31 HISTORY — PX: VENTRICULAR ASSIST DEVICE INSERTION: CATH118273

## 2017-03-31 HISTORY — PX: RIGHT HEART CATH: CATH118263

## 2017-03-31 HISTORY — DX: Type 2 diabetes mellitus without complications: E11.9

## 2017-03-31 HISTORY — DX: Essential (primary) hypertension: I10

## 2017-03-31 LAB — COMPREHENSIVE METABOLIC PANEL
ALT: 27 U/L (ref 17–63)
AST: 58 U/L — ABNORMAL HIGH (ref 15–41)
Albumin: 3.2 g/dL — ABNORMAL LOW (ref 3.5–5.0)
Alkaline Phosphatase: 86 U/L (ref 38–126)
Anion gap: 9 (ref 5–15)
BILIRUBIN TOTAL: 1.3 mg/dL — AB (ref 0.3–1.2)
BUN: 13 mg/dL (ref 6–20)
CHLORIDE: 101 mmol/L (ref 101–111)
CO2: 21 mmol/L — ABNORMAL LOW (ref 22–32)
CREATININE: 1.24 mg/dL (ref 0.61–1.24)
Calcium: 8.4 mg/dL — ABNORMAL LOW (ref 8.9–10.3)
GFR calc non Af Amer: 57 mL/min — ABNORMAL LOW (ref >60)
Glucose, Bld: 184 mg/dL — ABNORMAL HIGH (ref 65–99)
Potassium: 3.6 mmol/L (ref 3.5–5.1)
Sodium: 131 mmol/L — ABNORMAL LOW (ref 135–145)
TOTAL PROTEIN: 6.1 g/dL — AB (ref 6.5–8.1)

## 2017-03-31 LAB — POCT I-STAT 3, VENOUS BLOOD GAS (G3P V)
ACID-BASE DEFICIT: 2 mmol/L (ref 0.0–2.0)
ACID-BASE DEFICIT: 5 mmol/L — AB (ref 0.0–2.0)
BICARBONATE: 22.3 mmol/L (ref 20.0–28.0)
Bicarbonate: 19.8 mmol/L — ABNORMAL LOW (ref 20.0–28.0)
O2 Saturation: 52 %
O2 Saturation: 54 %
PH VEN: 7.404 (ref 7.250–7.430)
PO2 VEN: 28 mmHg — AB (ref 32.0–45.0)
TCO2: 21 mmol/L — AB (ref 22–32)
TCO2: 23 mmol/L (ref 22–32)
pCO2, Ven: 34 mmHg — ABNORMAL LOW (ref 44.0–60.0)
pCO2, Ven: 35.6 mmHg — ABNORMAL LOW (ref 44.0–60.0)
pH, Ven: 7.372 (ref 7.250–7.430)
pO2, Ven: 28 mmHg — CL (ref 32.0–45.0)

## 2017-03-31 LAB — POCT ACTIVATED CLOTTING TIME
Activated Clotting Time: 186 seconds
Activated Clotting Time: 241 seconds
Activated Clotting Time: 257 seconds

## 2017-03-31 LAB — POCT I-STAT 3, ART BLOOD GAS (G3+)
ACID-BASE DEFICIT: 3 mmol/L — AB (ref 0.0–2.0)
ACID-BASE DEFICIT: 5 mmol/L — AB (ref 0.0–2.0)
Bicarbonate: 17.7 mmol/L — ABNORMAL LOW (ref 20.0–28.0)
Bicarbonate: 20.2 mmol/L (ref 20.0–28.0)
O2 Saturation: 89 %
O2 Saturation: 97 %
PH ART: 7.411 (ref 7.350–7.450)
PO2 ART: 86 mmHg (ref 83.0–108.0)
TCO2: 19 mmol/L — AB (ref 22–32)
TCO2: 21 mmol/L — ABNORMAL LOW (ref 22–32)
pCO2 arterial: 27.9 mmHg — ABNORMAL LOW (ref 32.0–48.0)
pCO2 arterial: 31 mmHg — ABNORMAL LOW (ref 32.0–48.0)
pH, Arterial: 7.423 (ref 7.350–7.450)
pO2, Arterial: 55 mmHg — ABNORMAL LOW (ref 83.0–108.0)

## 2017-03-31 LAB — CBC WITH DIFFERENTIAL/PLATELET
BASOS ABS: 0 10*3/uL (ref 0.0–0.1)
BASOS PCT: 0 %
EOS PCT: 0 %
Eosinophils Absolute: 0 10*3/uL (ref 0.0–0.7)
HCT: 42.2 % (ref 39.0–52.0)
Hemoglobin: 14.2 g/dL (ref 13.0–17.0)
Lymphocytes Relative: 7 %
Lymphs Abs: 1.7 10*3/uL (ref 0.7–4.0)
MCH: 30.2 pg (ref 26.0–34.0)
MCHC: 33.6 g/dL (ref 30.0–36.0)
MCV: 89.8 fL (ref 78.0–100.0)
MONO ABS: 2.9 10*3/uL — AB (ref 0.1–1.0)
Monocytes Relative: 12 %
Neutro Abs: 19.7 10*3/uL — ABNORMAL HIGH (ref 1.7–7.7)
Neutrophils Relative %: 81 %
Platelets: 226 10*3/uL (ref 150–400)
RBC: 4.7 MIL/uL (ref 4.22–5.81)
RDW: 13.7 % (ref 11.5–15.5)
WBC: 24.4 10*3/uL — ABNORMAL HIGH (ref 4.0–10.5)

## 2017-03-31 LAB — I-STAT TROPONIN, ED: Troponin i, poc: 5.67 ng/mL (ref 0.00–0.08)

## 2017-03-31 LAB — TROPONIN I: Troponin I: 4.2 ng/mL (ref <0.03)

## 2017-03-31 LAB — LIPID PANEL
CHOL/HDL RATIO: 4.4 ratio
CHOLESTEROL: 149 mg/dL (ref 0–200)
HDL: 34 mg/dL — ABNORMAL LOW (ref >40)
LDL Cholesterol: 92 mg/dL (ref 0–99)
Triglycerides: 117 mg/dL (ref <150)
VLDL: 23 mg/dL (ref 0–40)

## 2017-03-31 LAB — APTT: APTT: 31 s (ref 24–36)

## 2017-03-31 LAB — PROTIME-INR
INR: 1.13
Prothrombin Time: 14.4 seconds (ref 11.4–15.2)

## 2017-03-31 LAB — CARDIAC CATHETERIZATION: Cath EF Quantitative: 25 %

## 2017-03-31 SURGERY — LEFT HEART CATH AND CORONARY ANGIOGRAPHY
Anesthesia: LOCAL | Laterality: Right

## 2017-03-31 MED ORDER — SODIUM CHLORIDE 0.9% FLUSH: 10.0000 mL | INTRAVENOUS | Status: DC | PRN

## 2017-03-31 MED ORDER — ATORVASTATIN CALCIUM 20 MG PO TABS: 20.0000 mg | ORAL_TABLET | Freq: Every day | ORAL | Status: DC

## 2017-03-31 MED ORDER — LIDOCAINE HCL (PF) 1 % IJ SOLN
INTRAMUSCULAR | Status: AC
  Filled 2017-03-31: qty 30

## 2017-03-31 MED ORDER — PIOGLITAZONE HCL-METFORMIN HCL 15-500 MG PO TABS: 1.0000 | ORAL_TABLET | Freq: Every day | ORAL | Status: DC

## 2017-03-31 MED ORDER — ACETAMINOPHEN 325 MG PO TABS
650.0000 mg | ORAL_TABLET | ORAL | Status: DC | PRN
  Administered 2017-04-01 – 2017-04-05 (×14): 650 mg via ORAL
  Filled 2017-03-31 (×14): qty 2

## 2017-03-31 MED ORDER — NITROGLYCERIN 0.4 MG SL SUBL: 0.4000 mg | SUBLINGUAL_TABLET | SUBLINGUAL | Status: DC | PRN

## 2017-03-31 MED ORDER — IOPAMIDOL (ISOVUE-370) INJECTION 76%
INTRAVENOUS | Status: AC
  Filled 2017-03-31: qty 125

## 2017-03-31 MED ORDER — VERAPAMIL HCL 2.5 MG/ML IV SOLN
INTRAVENOUS | Status: DC | PRN
  Administered 2017-03-31: 10 mL via INTRA_ARTERIAL

## 2017-03-31 MED ORDER — HEPARIN SODIUM (PORCINE) 5000 UNIT/ML IJ SOLN: 4000.0000 [IU] | Freq: Once | INTRAMUSCULAR | Status: AC

## 2017-03-31 MED ORDER — ONDANSETRON HCL 4 MG/2ML IJ SOLN: 4.0000 mg | Freq: Four times a day (QID) | INTRAMUSCULAR | Status: DC | PRN

## 2017-03-31 MED ORDER — FENTANYL CITRATE (PF) 100 MCG/2ML IJ SOLN
INTRAMUSCULAR | Status: AC
  Filled 2017-03-31: qty 2

## 2017-03-31 MED ORDER — DEXTROSE 5 % IV SOLN
50000.0000 [IU] | INTRAVENOUS | Status: DC
  Administered 2017-03-31 – 2017-04-05 (×3): 50000 [IU]
  Filled 2017-03-31 (×4): qty 10

## 2017-03-31 MED ORDER — HEPARIN SODIUM (PORCINE) 1000 UNIT/ML IJ SOLN
INTRAMUSCULAR | Status: AC
  Filled 2017-03-31: qty 1

## 2017-03-31 MED ORDER — SODIUM CHLORIDE 0.9 % IV SOLN
250.0000 mL | INTRAVENOUS | Status: DC | PRN
  Administered 2017-04-01: 250 mL via INTRAVENOUS
  Administered 2017-04-02: 250 mL/h via INTRAVENOUS
  Administered 2017-04-02 – 2017-04-05 (×2): 250 mL via INTRAVENOUS

## 2017-03-31 MED ORDER — ASPIRIN 81 MG PO CHEW
324.0000 mg | CHEWABLE_TABLET | Freq: Once | ORAL | Status: AC
  Administered 2017-03-31: 324 mg via ORAL
  Filled 2017-03-31: qty 4

## 2017-03-31 MED ORDER — SODIUM CHLORIDE 0.9 % IV SOLN
INTRAVENOUS | Status: AC
  Administered 2017-03-31: 22:00:00 via INTRAVENOUS

## 2017-03-31 MED ORDER — HEPARIN (PORCINE) IN NACL 2-0.9 UNIT/ML-% IJ SOLN
INTRAMUSCULAR | Status: AC | PRN
  Administered 2017-03-31: 1000 mL

## 2017-03-31 MED ORDER — VERAPAMIL HCL 2.5 MG/ML IV SOLN
INTRAVENOUS | Status: AC
  Filled 2017-03-31: qty 2

## 2017-03-31 MED ORDER — INSULIN ASPART 100 UNIT/ML ~~LOC~~ SOLN
0.0000 [IU] | Freq: Three times a day (TID) | SUBCUTANEOUS | Status: DC
  Administered 2017-04-01: 4 [IU] via SUBCUTANEOUS
  Administered 2017-04-01 (×2): 3 [IU] via SUBCUTANEOUS
  Administered 2017-04-02: 4 [IU] via SUBCUTANEOUS
  Administered 2017-04-02 (×2): 3 [IU] via SUBCUTANEOUS
  Administered 2017-04-03: 4 [IU] via SUBCUTANEOUS
  Administered 2017-04-03 – 2017-04-05 (×6): 3 [IU] via SUBCUTANEOUS
  Administered 2017-04-05: 4 [IU] via SUBCUTANEOUS
  Administered 2017-04-06: 7 [IU] via SUBCUTANEOUS
  Administered 2017-04-06: 11 [IU] via SUBCUTANEOUS
  Administered 2017-04-06 – 2017-04-07 (×2): 7 [IU] via SUBCUTANEOUS
  Administered 2017-04-07: 11 [IU] via SUBCUTANEOUS
  Administered 2017-04-07: 7 [IU] via SUBCUTANEOUS

## 2017-03-31 MED ORDER — ASPIRIN EC 81 MG PO TBEC
81.0000 mg | DELAYED_RELEASE_TABLET | Freq: Every day | ORAL | Status: DC
  Administered 2017-04-01 – 2017-04-07 (×7): 81 mg via ORAL
  Filled 2017-03-31 (×8): qty 1

## 2017-03-31 MED ORDER — FUROSEMIDE 10 MG/ML IJ SOLN
INTRAMUSCULAR | Status: AC
  Filled 2017-03-31: qty 4

## 2017-03-31 MED ORDER — FENTANYL CITRATE (PF) 100 MCG/2ML IJ SOLN
INTRAMUSCULAR | Status: DC | PRN
  Administered 2017-03-31: 50 ug via INTRAVENOUS
  Administered 2017-03-31 (×2): 25 ug via INTRAVENOUS

## 2017-03-31 MED ORDER — MORPHINE SULFATE (PF) 4 MG/ML IV SOLN
2.0000 mg | INTRAVENOUS | Status: DC | PRN
  Administered 2017-04-01 – 2017-04-05 (×17): 2 mg via INTRAVENOUS
  Filled 2017-03-31 (×18): qty 1

## 2017-03-31 MED ORDER — NITROGLYCERIN 1 MG/10 ML FOR IR/CATH LAB
INTRA_ARTERIAL | Status: AC
  Filled 2017-03-31: qty 10

## 2017-03-31 MED ORDER — SODIUM CHLORIDE 0.9% FLUSH: 3.0000 mL | INTRAVENOUS | Status: DC | PRN

## 2017-03-31 MED ORDER — SODIUM CHLORIDE 0.9% FLUSH
10.0000 mL | Freq: Two times a day (BID) | INTRAVENOUS | Status: DC
  Administered 2017-03-31 – 2017-04-06 (×8): 10 mL
  Administered 2017-04-07: 20 mL

## 2017-03-31 MED ORDER — HEPARIN BOLUS VIA INFUSION
4000.0000 [IU] | Freq: Once | INTRAVENOUS | Status: DC
  Administered 2017-03-31: 4000 [IU] via INTRAVENOUS

## 2017-03-31 MED ORDER — HEPARIN SODIUM (PORCINE) 5000 UNIT/ML IJ SOLN: 60.0000 [IU]/kg | Freq: Once | INTRAMUSCULAR | Status: DC

## 2017-03-31 MED ORDER — SODIUM CHLORIDE 0.9 % IV SOLN
Freq: Once | INTRAVENOUS | Status: AC
  Administered 2017-03-31: 18:00:00 via INTRAVENOUS

## 2017-03-31 MED ORDER — LISINOPRIL 20 MG PO TABS: 20.0000 mg | ORAL_TABLET | ORAL | Status: DC

## 2017-03-31 MED ORDER — HEPARIN SODIUM (PORCINE) 1000 UNIT/ML IJ SOLN
INTRAMUSCULAR | Status: DC | PRN
  Administered 2017-03-31: 5000 [IU] via INTRAVENOUS

## 2017-03-31 MED ORDER — FUROSEMIDE 10 MG/ML IJ SOLN
INTRAMUSCULAR | Status: DC | PRN
  Administered 2017-03-31: 40 mg via INTRAVENOUS

## 2017-03-31 MED ORDER — HEPARIN (PORCINE) IN NACL 100-0.45 UNIT/ML-% IJ SOLN
INTRAMUSCULAR | Status: AC
  Administered 2017-03-31
  Filled 2017-03-31: qty 250

## 2017-03-31 MED ORDER — HEPARIN (PORCINE) IN NACL 2-0.9 UNIT/ML-% IJ SOLN
INTRAMUSCULAR | Status: AC
  Filled 2017-03-31: qty 1000

## 2017-03-31 MED ORDER — HEPARIN (PORCINE) IN NACL 100-0.45 UNIT/ML-% IJ SOLN: 12.0000 [IU]/kg/h | INTRAMUSCULAR | Status: DC

## 2017-03-31 MED ORDER — LIDOCAINE HCL (PF) 1 % IJ SOLN
INTRAMUSCULAR | Status: DC | PRN
  Administered 2017-03-31: 15 mL via INTRADERMAL
  Administered 2017-03-31: 2 mL via INTRADERMAL

## 2017-03-31 MED ORDER — HEPARIN SODIUM (PORCINE) 5000 UNIT/ML IJ SOLN: 5000.0000 [IU] | Freq: Three times a day (TID) | INTRAMUSCULAR | Status: DC

## 2017-03-31 MED ORDER — METOPROLOL TARTRATE 25 MG PO TABS
25.0000 mg | ORAL_TABLET | Freq: Two times a day (BID) | ORAL | Status: DC
  Administered 2017-04-01 (×2): 25 mg via ORAL
  Filled 2017-03-31 (×2): qty 1

## 2017-03-31 MED ORDER — SODIUM CHLORIDE 0.9% FLUSH
3.0000 mL | Freq: Two times a day (BID) | INTRAVENOUS | Status: DC
  Administered 2017-04-01 – 2017-04-06 (×10): 3 mL via INTRAVENOUS

## 2017-03-31 MED ORDER — MIDAZOLAM HCL 2 MG/2ML IJ SOLN
INTRAMUSCULAR | Status: DC | PRN
  Administered 2017-03-31: 1 mg via INTRAVENOUS

## 2017-03-31 MED ORDER — FUROSEMIDE 10 MG/ML IJ SOLN
4.0000 mg/h | INTRAVENOUS | Status: DC
  Administered 2017-04-01: 4 mg/h via INTRAVENOUS
  Filled 2017-03-31: qty 25

## 2017-03-31 MED ORDER — AMLODIPINE BESYLATE 10 MG PO TABS: 10.0000 mg | ORAL_TABLET | Freq: Every day | ORAL | Status: DC

## 2017-03-31 MED ORDER — MIDAZOLAM HCL 2 MG/2ML IJ SOLN
INTRAMUSCULAR | Status: AC
  Filled 2017-03-31: qty 2

## 2017-03-31 SURGICAL SUPPLY — 19 items
CATH INFINITI 5 FR JL3.5 (CATHETERS) ×1 IMPLANT
CATH INFINITI 5FR ANG PIGTAIL (CATHETERS) ×1 IMPLANT
CATH OPTITORQUE TIG 4.0 5F (CATHETERS) ×1 IMPLANT
CATH SWAN GANZ 7F STRAIGHT (CATHETERS) ×1 IMPLANT
DEVICE RAD COMP TR BAND LRG (VASCULAR PRODUCTS) ×1 IMPLANT
GLIDESHEATH SLEND A-KIT 6F 22G (SHEATH) ×1 IMPLANT
GUIDEWIRE INQWIRE 1.5J.035X260 (WIRE) IMPLANT
INQWIRE 1.5J .035X260CM (WIRE) ×6
KIT ENCORE 26 ADVANTAGE (KITS) ×4 IMPLANT
KIT HEART LEFT (KITS) ×3 IMPLANT
PACK CARDIAC CATHETERIZATION (CUSTOM PROCEDURE TRAY) ×3 IMPLANT
SET IMPELLA CP PUMP (CATHETERS) ×1 IMPLANT
SHEATH PINNACLE 5F 10CM (SHEATH) ×1 IMPLANT
SHEATH PINNACLE 7F 10CM (SHEATH) ×1 IMPLANT
SLEEVE REPOSITIONING LENGTH 30 (MISCELLANEOUS) ×1 IMPLANT
SYR MEDRAD MARK V 150ML (SYRINGE) ×3 IMPLANT
TRANSDUCER W/STOPCOCK (MISCELLANEOUS) ×3 IMPLANT
TUBING CIL FLEX 10 FLL-RA (TUBING) ×3 IMPLANT
WIRE EMERALD 3MM-J .035X150CM (WIRE) ×1 IMPLANT

## 2017-03-31 NOTE — Interval H&P Note (Signed)
History and Physical Interval Note:  03/31/2017 6:57 PM  [name] [name]  has presented today for surgery, with the diagnosis of STEMI  The various methods of treatment have been discussed with the patient and family. After consideration of risks, benefits and other options for treatment, the patient has consented to  Procedure(s): Coronary/Graft Acute MI Revascularization (N/A) LEFT HEART CATH AND CORONARY ANGIOGRAPHY (N/A) With Possible Produced Coronary Intervention as a surgical intervention .  The patient's history has been reviewed, patient examined, no change in status, stable for surgery.  I have reviewed the patient's chart and labs.  Questions were answered to the patient's satisfaction.    Cath Lab Visit (complete for each Cath Lab visit)  Clinical Evaluation Leading to the Procedure:   ACS: Yes.    Non-ACS:    Anginal Classification: CCS IV  Anti-ischemic medical therapy: No Therapy  Non-Invasive Test Results: No non-invasive testing performed  Prior CABG: No previous CABG   Glenetta Hew

## 2017-03-31 NOTE — ED Triage Notes (Signed)
Pt from home by [facility] for shortness of breath increased today. Pt has needed O2 since EMS has been with pt. Pt has received 5mg  neb treatment

## 2017-03-31 NOTE — Progress Notes (Signed)
  Echocardiogram 2D Echocardiogram has been performed.  [name] [name] 03/31/2017, 9:36 PM

## 2017-03-31 NOTE — ED Provider Notes (Signed)
[facility]PART[facility] Provider Note   CSN: [id number] Arrival date & time: 03/31/17  1759     History   Chief Complaint No chief complaint on file.   HPI [name] [name] is a 71 y.o. male.  HPI  71 y.o. male with a hx of HTN, DM, presents to the Emergency Department today due to shortness of breath and chest discomfort. Notes cough/congestion with URI symptoms that he has been treating with theraflu with minimal relief. Noted worsening symptoms today and notified EMS. Diminished breath sounds bilaterally with wheeze noted. O2 saturations low 80s on RA. No home O2 usage. Placed on NRB and given Neb treatments with improvement. Denies chest pain currently. Did not chest discomfort earlier today with radiation into left arm, but subsided. No hx ACS. Known hx bladder cancer several years ago, but is in remission. Does not use blood thinners. No other symptoms noted    Past Medical History:  Diagnosis Date  . Bladder cancer   . Borderline glaucoma   . Diabetes mellitus ORAL MED  . Hypertension     There are no active problems to display for this patient.   Past Surgical History:  Procedure Laterality Date  . CAROTID DUPLEX SCAN  07-26-2010   BILATERAL ICA  STENOSIS 1% - 39%  . CYSTOSCOPY WITH BIOPSY N/A 08/01/2012   Procedure: CYSTOSCOPY WITH BIOPSY BLADDER BIOPSY   ;  Surgeon: Fredricka Bonine, MD;  Location: [facility];  Service: Urology;  Laterality: N/A;  . FULGURATION OF BLADDER TUMOR N/A 08/01/2012   Procedure: FULGURATION OF BLADDER TUMOR;  Surgeon: Fredricka Bonine, MD;  Location: [facility];  Service: Urology;  Laterality: N/A;  . TRANSURETHRAL RESECTION OF BLADDER TUMOR  05/29/2011   Procedure: TRANSURETHRAL RESECTION OF BLADDER TUMOR (TURBT);  Surgeon: Fredricka Bonine, MD;  Location: [facility];  Service: Urology;  Laterality: N/A;       Home Medications    Prior to  Admission medications   Medication Sig Start Date End Date Taking? Authorizing Provider  aspirin 81 MG tablet Take 2 tablets (162 mg total) by mouth daily. 06/01/11   Festus Aloe, MD  atorvastatin (LIPITOR) 40 MG tablet Take 20 mg by mouth every morning.     [provider]  lisinopril (PRINIVIL,ZESTRIL) 20 MG tablet Take 20 mg by mouth every morning.    [provider]  phenazopyridine (PYRIDIUM) 200 MG tablet Take 1 tablet (200 mg total) by mouth 3 (three) times daily as needed for pain. 08/01/12   Festus Aloe, MD  pioglitazone-metformin (ACTOPLUS MET) 15-500 MG per tablet Take 1 tablet by mouth daily.     [provider]    Family History No family history on file.  Social History Social History   Tobacco Use  . Smoking status: Current Every Day Smoker    Packs/day: 1.00    Years: 40.00    Pack years: 40.00    Types: Cigarettes  . Smokeless tobacco: Never Used  Substance Use Topics  . Alcohol use: No  . Drug use: No     Allergies   Patient has no known allergies.   Review of Systems Review of Systems ROS reviewed and all are negative for acute change except as noted in the HPI.  Physical Exam Updated Vital Signs BP 117/78   Pulse (!) 116   Resp 15   SpO2 93%   Physical Exam  Constitutional: He is oriented to person,  place, and time. He appears well-developed and well-nourished. No distress.  HENT:  Head: Normocephalic and atraumatic.  Right Ear: Tympanic membrane, external ear and ear canal normal.  Left Ear: Tympanic membrane, external ear and ear canal normal.  Nose: Nose normal.  Mouth/Throat: Uvula is midline, oropharynx is clear and moist and mucous membranes are normal. No trismus in the jaw. No oropharyngeal exudate, posterior oropharyngeal erythema or tonsillar abscesses.  Eyes: EOM are normal. Pupils are equal, round, and reactive to light.  Neck: Normal range of motion. Neck supple. No tracheal deviation present.    Cardiovascular: Regular rhythm, S1 normal, S2 normal, normal heart sounds, intact distal pulses and normal pulses. Tachycardia present.  Pulmonary/Chest: Effort normal. No respiratory distress. He has decreased breath sounds in the right lower field. He has wheezes in the right upper field, the right lower field, the left upper field and the left lower field. He has no rhonchi. He has no rales.  Abdominal: Normal appearance and bowel sounds are normal. There is no tenderness.  Musculoskeletal: Normal range of motion.  Neurological: He is alert and oriented to person, place, and time.  Skin: Skin is warm and dry.  Psychiatric: He has a normal mood and affect. His speech is normal and behavior is normal. Thought content normal.  Nursing note and vitals reviewed.  ED Treatments / Results  Labs (all labs ordered are listed, but only abnormal results are displayed) Labs Reviewed  CBC WITH DIFFERENTIAL/PLATELET - Abnormal; Notable for the following components:      Result Value   WBC 24.4 (*)    Neutro Abs 19.7 (*)    Monocytes Absolute 2.9 (*)    All other components within normal limits  I-STAT TROPONIN, ED - Abnormal; Notable for the following components:   Troponin i, poc 5.67 (*)    All other components within normal limits  PROTIME-INR  APTT  COMPREHENSIVE METABOLIC PANEL  TROPONIN I  LIPID PANEL    EKG  EKG Interpretation  Date/Time:  Sunday March 31 2017 18:03:49 EST Ventricular Rate:  122 PR Interval:    QRS Duration: 101 QT Interval:  284 QTC Calculation: 405 R Axis:   -70 Text Interpretation:  Age not entered, assumed to be  71 years old for purpose of ECG interpretation Sinus tachycardia LAE, consider biatrial enlargement Left axis deviation Anteroseptal infarct, possibly acute Lateral leads are also involved ** ** ACUTE MI / STEMI ** ** Confirmed by Antony Blackbird [phone]) on 03/31/2017 6:11:44 PM      Radiology No results found.  Procedures Procedures  (including critical care time) CRITICAL CARE Performed by: Ozella Rocks   Total critical care time: 40 minutes  Critical care time was exclusive of separately billable procedures and treating other patients.  Critical care was necessary to treat or prevent imminent or life-threatening deterioration.  Critical care was time spent personally by me on the following activities: development of treatment plan with patient and/or surrogate as well as nursing, discussions with consultants, evaluation of patient's response to treatment, examination of patient, obtaining history from patient or surrogate, ordering and performing treatments and interventions, ordering and review of laboratory studies, ordering and review of radiographic studies, pulse oximetry and re-evaluation of patient's condition.   Medications Ordered in ED Medications  heparin injection 60 Units/kg (not administered)  heparin ADULT infusion 100 units/mL (25000 units/239m sodium chloride 0.45%) (not administered)  aspirin chewable tablet 324 mg (324 mg Oral Given 03/31/17 1819)  0.9 %  sodium chloride  infusion ( Intravenous New Bag/Given 03/31/17 1824)     Initial Impression / Assessment and Plan / ED Course  I have reviewed the triage vital signs and the nursing notes.  Pertinent labs & imaging results that were available during my care of the patient were reviewed by me and considered in my medical decision making (see chart for details).  Final Clinical Impressions(s) / ED Diagnoses  {I have reviewed and evaluated the relevant laboratory values. {I have reviewed and evaluated the relevant imaging studies. {I have interpreted the relevant EKG. {I have reviewed the relevant previous healthcare records.  {I obtained HPI from historian. {Patient discussed with supervising physician.  ED Course:  Assessment: Pt is a 71 y.o. male with a hx of HTN, DM, presents to the Emergency Department today due to shortness of breath and  chest discomfort. Notes cough/congestion with URI symptoms that he has been treating with theraflu with minimal relief. Noted worsening symptoms today and notified EMS. Diminished breath sounds bilaterally with wheeze noted. O2 saturations low 80s on RA. No home O2 usage. Placed on NRB and given Neb treatments with improvement. Denies chest pain currently. Did not chest discomfort earlier today with radiation into left arm, but subsided. No hx ACS. Known hx bladder cancer several years ago, but is in remission. Does not use blood thinners.  On exam, pt in NAD. Nontoxic/nonseptic appearing. VS with tachycardia. Normotensive. Afebrile. Lungs bilateral wheeze with diminished on right. Heart RRR. Abdomen nontender soft. EKG with ST elevations noted antero-lateral leads. STEMI activated. Given ASA. [facility]. Heparin started. Will go to Cath lab. Trop 5.67. Labs pending. Plan is to [place].   Disposition/Plan:  Admit Pt acknowledges and agrees with plan  Supervising Physician Tegeler, Gwenyth Allegra, *  Final diagnoses:  ST elevation myocardial infarction (STEMI), unspecified artery [facility])    ED Discharge Orders    None       Shary Decamp, PA-C 03/31/17 1848    Tegeler, Gwenyth Allegra, MD 04/02/17 1321

## 2017-03-31 NOTE — ED Notes (Signed)
Activated code stemi  

## 2017-03-31 NOTE — Procedures (Signed)
    Peripheral Arterial Line Insertion Procedure Note BRYDON SPAHR [id number] 1946/03/10  Procedure: Insertion of Peripheral Arterial LineCatheter Indications: Frequent blood sampling and Arterial Pressure monitoring  Procedure Details Consent: Consent as part of Cardiac Cath.  this was delayed until pt came to room in order to get Impella set up. Time Out: Verified patient identification, verified procedure, site/side was marked, verified correct patient position, special equipment/implants available, medications/allergies/relevent history reviewed, required imaging and test results available.  Performed  Maximum sterile technique was used including antiseptics, cap, gloves, gown, hand hygiene, mask and sterile towels. Skin prep: Chlorhexidine; local anesthetic administered A  single lumen Arrow Arterial Line catheter was placed in the Left Radial Artery using the Seldinger technique. - Direct Ultrasound Guidance was used.   The line was attached to the A-line pressure tubing, then sutured in place. The site was then re-cleansed & dressed in a sterile manner with antimicrobial disc  Evaluation Blood flow good Complications: No apparent complications Patient did tolerate procedure well.   Glenetta Hew 03/31/2017, 11:33 PM

## 2017-03-31 NOTE — H&P (Signed)
    BRIEF H&P  See Full note from On-Call Fellow.  PCP: No primary care provider on file.   71 y.o. male with a hx of HTN, DM, presents to the Emergency Department today due to shortness of breath and chest discomfort. Notes cough/congestion with URI symptoms that he has been treating with theraflu with minimal relief. Noted worsening symptoms today and notified EMS. Diminished breath sounds bilaterally with wheeze noted. O2 saturations low 80s on RA. No home O2 usage. Placed on NRB and given Neb treatments with improvement. Denies chest pain currently. Did not chest discomfort earlier today with radiation into left arm, but subsided.  He has she noted he started having chest discomfort on Thursday evening November 29 -and he has had intermittent chest discomfort since then.  Not to the same significance that the initial episode was.  Currently having 2-3/10 chest discomfort and his respiratory status is roughly 4-5/10.  EKG showed sinus tachycardia (122) with subtle ST elevations remaining in V1 and V2 but 344 mm ST elevations in V3 and with diffuse ST depressions.  There is also significant Q waves noted in anteroseptal leads suggestive that this is a potentially subacute MI. --> When this was seen by the ER physician, code STEMI was called.  Physical Exam  Constitutional: He is oriented to person, place, and time. He appears well-developed and well-nourished. He appears distressed.  HENT:  Head: Normocephalic and atraumatic.  Eyes: Conjunctivae and EOM are normal. Pupils are equal, round, and reactive to light.  Neck: No hepatojugular reflux and no JVD present. Carotid bruit is not present.  Cardiovascular: Regular rhythm, normal heart sounds and normal pulses. Tachycardia present. PMI is not displaced. Exam reveals no gallop and no friction rub.  No murmur heard. Pulmonary/Chest: He has wheezes.  Diffuse bilateral wheezing with diminished breath sounds in the bases.  Currently stable with  no increased work of breathing, but was notably dyspneic with ambulation.  Abdominal: Soft. Bowel sounds are normal. He exhibits no distension. There is no tenderness. There is no rebound.  Musculoskeletal: Normal range of motion. He exhibits no edema.  Neurological: He is alert and oriented to person, place, and time. No cranial nerve deficit.  Skin: Skin is warm and dry. No rash noted. No erythema.  Psychiatric: He has a normal mood and affect. His behavior is normal. Judgment and thought content normal.  Nursing note and vitals reviewed.  No results found for: CKTOTAL, CKMB, CKMBINDEX, TROPONINI - POC lab - 5.  Assessment plan: Patient presents with what looks like subacute presentation of an anterior ST elevation MI with Q waves already formed.  He is still having chest discomfort however and is dyspneic.  There is some subtle ST elevation in aVR which could be suggestive of left main disease.  Based on his somewhat unstable presentation we will proceed with emergent cardiac catheterization.    Full H&P to follow.  Glenetta Hew, MD

## 2017-03-31 NOTE — ED Notes (Signed)
Heparin 5000 given per Cardiology MD

## 2017-03-31 NOTE — H&P (Signed)
CARDIOLOGY HISTORY AND PHYSICAL     Date: 03/31/2017 Admitting Physician: No admitting provider for patient encounter.  Chief Complaint:  Dyspnea ____________________________________________________________________ History of Present Illness:  [name] [name] is a 71 y.o. old male with medical history noted below presents to the ED with acute complaints of dyspnea.  The patient states he has been feeling poorly since Friday however though this was secondary to the flu.  He has been having some chest pain some of which moves into this shoulders.  He again attributed this to his flu symptoms, however, today he has had progressive dyspnea and was unable to walk across the room of his house.  He has no cardiac history.  Currently without chest pain, however, he continues to have dyspnea but is better that earlier.  ECG with ST segment elevation in V1-V4 and reciprical changes in the inferior and lateral leads.  Interventional cardiology will be taking to the cath lab.    Review of Systems:   Review of Systems:  GEN: +fever, chills, nausea, vomiting, weight change  HEENT: no vision or hearing changes  PULM: +coughing, +SOB  CV: +chest pain, palpitations, PND, orthopnea  GI: no abdominal pain  GU: no dysuria  EXT: no swelling  SKIN: no rashes  NEURO: no numbness or tingling  HEME: no bleeding or bruising  GYN: none  --12 point review systems- otherwise negative.  All other systems reviewed and are negative  Past Medical History:  Diagnosis Date  . Bladder cancer ([place])   . Borderline glaucoma   . Diabetes mellitus type 2, controlled ([place]) ORAL MED  . Essential hypertension     Past Surgical History:  Procedure Laterality Date  . CAROTID DUPLEX SCAN  07-26-2010   BILATERAL ICA  STENOSIS 1% - 39%  . CYSTOSCOPY WITH BIOPSY N/A 08/01/2012   Procedure: CYSTOSCOPY WITH BIOPSY BLADDER BIOPSY   ;  Surgeon: Fredricka Bonine, MD;  Location: [facility];  Service:  Urology;  Laterality: N/A;  . FULGURATION OF BLADDER TUMOR N/A 08/01/2012   Procedure: FULGURATION OF BLADDER TUMOR;  Surgeon: Fredricka Bonine, MD;  Location: [facility];  Service: Urology;  Laterality: N/A;  . TRANSURETHRAL RESECTION OF BLADDER TUMOR  05/29/2011   Procedure: TRANSURETHRAL RESECTION OF BLADDER TUMOR (TURBT);  Surgeon: Fredricka Bonine, MD;  Location: [facility];  Service: Urology;  Laterality: N/A;    Social History   Socioeconomic History  . Marital status: Married    Spouse name: Not on file  . Number of children: Not on file  . Years of education: Not on file  . Highest education level: Not on file  Social Needs  . Financial resource strain: Not on file  . Food insecurity - worry: Not on file  . Food insecurity - inability: Not on file  . Transportation needs - medical: Not on file  . Transportation needs - non-medical: Not on file  Occupational History  . Not on file  Tobacco Use  . Smoking status: Current Every Day Smoker    Packs/day: 1.00    Years: 40.00    Pack years: 40.00    Types: Cigarettes  . Smokeless tobacco: Never Used  Substance and Sexual Activity  . Alcohol use: No  . Drug use: No  . Sexual activity: Not on file  Other Topics Concern  . Not on file  Social History Narrative  . Not on file    History reviewed. No pertinent family history.  Past Cardiovascular History:  - No documented h/o CAD - No documented h/o MI - No documented h/o CHF - No documented h/o PVD - No documented h/o AAA - No documented h/o valvular heart disease - No documented h/o CVA - No documented h/o Arrhythmias - No documented h/o A-fib  - No documented h/o congenital heart disease - No documented h/o CABG - No documented h/o PCI - No documented h/o cardiac devices (Pacer/ICD/CRT) - No documented h/o cardiac surgery       Most recent stress test:  None  Most recent echocardiography:  None  Most recent  left heart catheterization:  None  CABG:  Date/ Physician: None  Device history:  None  Prior to Admission medications   Medication Sig Start Date End Date Taking? Authorizing Provider  amLODipine (NORVASC) 10 MG tablet Take 10 mg by mouth daily. 03/27/17  Yes [provider]  aspirin 81 MG tablet Take 2 tablets (162 mg total) by mouth daily. Patient taking differently: Take 325 mg by mouth daily.  06/01/11  Yes Festus Aloe, MD  atorvastatin (LIPITOR) 20 MG tablet Take 20 mg by mouth every morning.    Yes [provider]  lisinopril (PRINIVIL,ZESTRIL) 20 MG tablet Take 20 mg by mouth every morning.   Yes [provider]  mometasone (NASONEX) 50 MCG/ACT nasal spray Place 1-2 sprays into the nose daily as needed for allergies. 12/31/16  Yes [provider]  Phenylephrine-Pheniramine-DM [facility] COLD & COUGH PO) Take by mouth 3 (three) times daily as needed (flu sx).   Yes [provider]  pioglitazone-metformin (ACTOPLUS MET) 15-500 MG per tablet Take 1 tablet by mouth daily.    Yes [provider]    No Known Allergies  Social History:   Social History   Tobacco Use  . Smoking status: Current Every Day Smoker    Packs/day: 1.00    Years: 40.00    Pack years: 40.00    Types: Cigarettes  . Smokeless tobacco: Never Used  Substance Use Topics  . Alcohol use: No  . Drug use: No    History reviewed. No pertinent family history.  Physical Examination: Blood pressure 117/78, pulse (!) 116, resp. rate 15, SpO2 93 %. General:  AAOX 4.  NAD.  NRD.  HENT: Normocephalic. Atraumatic.  No acute abnom. EYES: PERRL EOMI  Neck: Supple.  No JVD.  No bruits. Cardiovascular:  Nl S1. Nl S2. No S3. No S4. Nl PMI. No m/r/c.  Pulmonary/Chest: CTA B. Rales bilateral. Mild wheezing.  Abdomen: Soft, NT, no masses, no organomegaly. Neuro: CN intact, no motor/sensory deficit.  Ext: Warm. No edema.  SKIN- intact  No intake or output data in  the 24 hours ending 03/31/17 1909  Troponin (Point of Care Test) Recent Labs    03/31/17 1819  TROPIPOC 5.67*   ____________________________________________________________________ Assessment/Plan  STEMI   Assessment:  Patient with ST segment elevation in V1-V4 with reciprical changes.  Currently in the cath lab.  Further recommendations to follow pending results.   Plan  -  Metoprolol 25 mg BID  -  Lipitor 80  -  Lisinopril   -  ECHO mg BID  -  Further recs pending cath results  Diabetes   Assessment:  Patient with controlled glucose per the patient's wife.  On pioglitzone and metformin at home.  Will cover with supplemental insulin for now.   Plan  -  Supplemental insulin  -  Hypoglycemic protocol  -  Will add insulin coverage  as needed   Thank you for consulting cardiology.    Electronically signed by Lowella Dandy 03/31/2017 Baruch Merl, MD, PhD Cardiology

## 2017-03-31 NOTE — Progress Notes (Signed)
Orthopedic Tech Progress Note Patient Details:  [name] [name] 01/16/1946 [id number]  Ortho Devices Type of Ortho Device: Knee Immobilizer Ortho Device/Splint Location: RN requested. Ortho Device/Splint Interventions: Ordered Dropped off with RN before pt arrived on unit.  Karolee Stamps 03/31/2017, 9:02 PM

## 2017-04-01 ENCOUNTER — Encounter (HOSPITAL_COMMUNITY): Payer: Self-pay | Admitting: Cardiology

## 2017-04-01 ENCOUNTER — Inpatient Hospital Stay (HOSPITAL_COMMUNITY): Payer: Medicare Other

## 2017-04-01 DIAGNOSIS — I2510 Atherosclerotic heart disease of native coronary artery without angina pectoris: Secondary | ICD-10-CM

## 2017-04-01 LAB — POCT ACTIVATED CLOTTING TIME
ACTIVATED CLOTTING TIME: 164 s
ACTIVATED CLOTTING TIME: 169 s
ACTIVATED CLOTTING TIME: 180 s
ACTIVATED CLOTTING TIME: 186 s
ACTIVATED CLOTTING TIME: 235 s
Activated Clotting Time: 202 seconds
Activated Clotting Time: 142 seconds
Activated Clotting Time: 180 seconds
Activated Clotting Time: 175 seconds
Activated Clotting Time: 169 seconds
Activated Clotting Time: 164 seconds
Activated Clotting Time: 169 seconds
Activated Clotting Time: 169 seconds
Activated Clotting Time: 219 seconds
Activated Clotting Time: 169 seconds
Activated Clotting Time: 175 seconds
Activated Clotting Time: 175 seconds
Activated Clotting Time: 191 seconds

## 2017-04-01 LAB — URINALYSIS, ROUTINE W REFLEX MICROSCOPIC

## 2017-04-01 LAB — CBC WITH DIFFERENTIAL/PLATELET
BASOS ABS: 0 10*3/uL (ref 0.0–0.1)
Basophils Relative: 0 %
EOS PCT: 0 %
Eosinophils Absolute: 0 10*3/uL (ref 0.0–0.7)
HCT: 38.5 % — ABNORMAL LOW (ref 39.0–52.0)
Hemoglobin: 13.2 g/dL (ref 13.0–17.0)
LYMPHS PCT: 5 %
Lymphs Abs: 1.1 10*3/uL (ref 0.7–4.0)
MCH: 30.2 pg (ref 26.0–34.0)
MCHC: 34.3 g/dL (ref 30.0–36.0)
MCV: 88.1 fL (ref 78.0–100.0)
MONOS PCT: 14 %
Monocytes Absolute: 3.1 10*3/uL — ABNORMAL HIGH (ref 0.1–1.0)
Neutro Abs: 18.1 10*3/uL — ABNORMAL HIGH (ref 1.7–7.7)
Neutrophils Relative %: 81 %
Platelets: 226 10*3/uL (ref 150–400)
RBC: 4.37 MIL/uL (ref 4.22–5.81)
RDW: 13.7 % (ref 11.5–15.5)
WBC: 22.3 10*3/uL — AB (ref 4.0–10.5)

## 2017-04-01 LAB — BASIC METABOLIC PANEL
Anion gap: 9 (ref 5–15)
BUN: 16 mg/dL (ref 6–20)
CALCIUM: 8 mg/dL — AB (ref 8.9–10.3)
CO2: 20 mmol/L — AB (ref 22–32)
CREATININE: 1.27 mg/dL — AB (ref 0.61–1.24)
Chloride: 103 mmol/L (ref 101–111)
GFR, EST NON AFRICAN AMERICAN: 55 mL/min — AB (ref >60)
Glucose, Bld: 156 mg/dL — ABNORMAL HIGH (ref 65–99)
Potassium: 4.1 mmol/L (ref 3.5–5.1)
SODIUM: 132 mmol/L — AB (ref 135–145)

## 2017-04-01 LAB — LIPID PANEL
CHOLESTEROL: 129 mg/dL (ref 0–200)
HDL: 28 mg/dL — ABNORMAL LOW (ref >40)
LDL CALC: 82 mg/dL (ref 0–99)
Total CHOL/HDL Ratio: 4.6 RATIO
Triglycerides: 93 mg/dL (ref <150)
VLDL: 19 mg/dL (ref 0–40)

## 2017-04-01 LAB — EXPECTORATED SPUTUM ASSESSMENT W REFEX TO RESP CULTURE

## 2017-04-01 LAB — RESPIRATORY PANEL BY PCR
Adenovirus: NOT DETECTED
BORDETELLA PERTUSSIS-RVPCR: NOT DETECTED
CORONAVIRUS 229E-RVPPCR: NOT DETECTED
Chlamydophila pneumoniae: NOT DETECTED
Coronavirus HKU1: NOT DETECTED
Coronavirus NL63: NOT DETECTED
Coronavirus OC43: NOT DETECTED
INFLUENZA B-RVPPCR: NOT DETECTED
Influenza A: NOT DETECTED
METAPNEUMOVIRUS-RVPPCR: NOT DETECTED
MYCOPLASMA PNEUMONIAE-RVPPCR: NOT DETECTED
PARAINFLUENZA VIRUS 2-RVPPCR: NOT DETECTED
Parainfluenza Virus 1: NOT DETECTED
Parainfluenza Virus 3: NOT DETECTED
Parainfluenza Virus 4: NOT DETECTED
RESPIRATORY SYNCYTIAL VIRUS-RVPPCR: NOT DETECTED
Rhinovirus / Enterovirus: NOT DETECTED

## 2017-04-01 LAB — BLOOD GAS, ARTERIAL
Acid-base deficit: 1.8 mmol/L (ref 0.0–2.0)
BICARBONATE: 21.1 mmol/L (ref 20.0–28.0)
Drawn by: 39898
O2 CONTENT: 6 L/min
O2 Saturation: 94 %
PATIENT TEMPERATURE: 100.4
PH ART: 7.472 — AB (ref 7.350–7.450)
pCO2 arterial: 29.6 mmHg — ABNORMAL LOW (ref 32.0–48.0)
pO2, Arterial: 70.6 mmHg — ABNORMAL LOW (ref 83.0–108.0)

## 2017-04-01 LAB — ECHOCARDIOGRAM COMPLETE
AVLVOTPG: 1 mmHg
CHL CUP MV DEC (S): 215
EERAT: 11.77
EWDT: 215 ms
FS: 16 % — AB (ref 28–44)
Height: 74 in
LA vol: 36.1 mL
LAVOLA4C: 42.7 mL
LAVOLIN: 16.3 mL/m2
LDCA: 3.14 cm2
LV TDI E'LATERAL: 5.77
LV TDI E'MEDIAL: 6.2
LV e' LATERAL: 5.77 cm/s
LVEEAVG: 11.77
LVEEMED: 11.77
LVOT SV: 38 mL
LVOT VTI: 12 cm
LVOT peak vel: 53.2 cm/s
LVOTD: 20 mm
MV pk A vel: 102 m/s
MV pk E vel: 67.9 m/s
RV LATERAL S' VELOCITY: 15.9 cm/s
TAPSE: 22 mm
Weight: 3379.21 oz

## 2017-04-01 LAB — POCT I-STAT 4, (NA,K, GLUC, HGB,HCT)
GLUCOSE: 157 mg/dL — AB (ref 65–99)
HCT: 38 % — ABNORMAL LOW (ref 39.0–52.0)
HEMOGLOBIN: 12.9 g/dL — AB (ref 13.0–17.0)
POTASSIUM: 4.1 mmol/L (ref 3.5–5.1)
Sodium: 134 mmol/L — ABNORMAL LOW (ref 135–145)

## 2017-04-01 LAB — URINALYSIS, MICROSCOPIC (REFLEX)

## 2017-04-01 LAB — LACTATE DEHYDROGENASE
LDH: 1276 U/L — ABNORMAL HIGH (ref 98–192)
LDH: 1293 U/L — ABNORMAL HIGH (ref 98–192)

## 2017-04-01 LAB — GLUCOSE, CAPILLARY
GLUCOSE-CAPILLARY: 186 mg/dL — AB (ref 65–99)
Glucose-Capillary: 145 mg/dL — ABNORMAL HIGH (ref 65–99)
Glucose-Capillary: 146 mg/dL — ABNORMAL HIGH (ref 65–99)
Glucose-Capillary: 181 mg/dL — ABNORMAL HIGH (ref 65–99)

## 2017-04-01 LAB — LACTIC ACID, PLASMA
LACTIC ACID, VENOUS: 1.4 mmol/L (ref 0.5–1.9)
Lactic Acid, Venous: 1.6 mmol/L (ref 0.5–1.9)

## 2017-04-01 LAB — COOXEMETRY PANEL
CARBOXYHEMOGLOBIN: 1.1 % (ref 0.5–1.5)
Methemoglobin: 1.5 % (ref 0.0–1.5)
O2 SAT: 72.3 %
Total hemoglobin: 10.8 g/dL — ABNORMAL LOW (ref 12.0–16.0)

## 2017-04-01 LAB — MRSA PCR SCREENING: MRSA by PCR: NEGATIVE

## 2017-04-01 LAB — EXPECTORATED SPUTUM ASSESSMENT W GRAM STAIN, RFLX TO RESP C

## 2017-04-01 LAB — BRAIN NATRIURETIC PEPTIDE: B Natriuretic Peptide: 786.9 pg/mL — ABNORMAL HIGH (ref 0.0–100.0)

## 2017-04-01 LAB — MAGNESIUM: MAGNESIUM: 1.8 mg/dL (ref 1.7–2.4)

## 2017-04-01 LAB — PROCALCITONIN: PROCALCITONIN: 0.42 ng/mL

## 2017-04-01 LAB — TROPONIN I: TROPONIN I: 53.99 ng/mL — AB (ref <0.03)

## 2017-04-01 MED ORDER — HYDRALAZINE HCL 20 MG/ML IJ SOLN
10.0000 mg | INTRAMUSCULAR | Status: DC | PRN
  Administered 2017-04-01 – 2017-04-02 (×2): 10 mg via INTRAVENOUS
  Filled 2017-04-01 (×3): qty 1

## 2017-04-01 MED ORDER — PIPERACILLIN-TAZOBACTAM 3.375 G IVPB
3.3750 g | Freq: Three times a day (TID) | INTRAVENOUS | Status: DC
  Administered 2017-04-01 – 2017-04-06 (×17): 3.375 g via INTRAVENOUS
  Filled 2017-04-01 (×18): qty 50

## 2017-04-01 MED ORDER — ENALAPRIL MALEATE 2.5 MG PO TABS
2.5000 mg | ORAL_TABLET | Freq: Two times a day (BID) | ORAL | Status: DC
  Administered 2017-04-01 (×2): 2.5 mg via ORAL
  Filled 2017-04-01 (×4): qty 1

## 2017-04-01 MED ORDER — HEPARIN (PORCINE) IN NACL 100-0.45 UNIT/ML-% IJ SOLN
400.0000 [IU]/h | INTRAMUSCULAR | Status: DC
  Administered 2017-04-01: 200 [IU]/h via INTRAVENOUS

## 2017-04-01 MED ORDER — ATORVASTATIN CALCIUM 80 MG PO TABS
80.0000 mg | ORAL_TABLET | Freq: Every day | ORAL | Status: DC
  Administered 2017-04-01 – 2017-04-17 (×17): 80 mg via ORAL
  Filled 2017-04-01 (×17): qty 1

## 2017-04-01 MED ORDER — VANCOMYCIN HCL 10 G IV SOLR
1250.0000 mg | Freq: Two times a day (BID) | INTRAVENOUS | Status: DC
  Administered 2017-04-01 – 2017-04-10 (×19): 1250 mg via INTRAVENOUS
  Filled 2017-04-01 (×22): qty 1250

## 2017-04-01 MED ORDER — SODIUM CHLORIDE 0.9 % IV SOLN
INTRAVENOUS | Status: DC
  Administered 2017-04-01: 10 mL/h via INTRAVENOUS
  Administered 2017-04-03 – 2017-04-05 (×2): via INTRAVENOUS

## 2017-04-01 MED ORDER — MAGNESIUM SULFATE 2 GM/50ML IV SOLN
2.0000 g | Freq: Once | INTRAVENOUS | Status: AC
  Administered 2017-04-01: 2 g via INTRAVENOUS
  Filled 2017-04-01: qty 50

## 2017-04-01 MED ORDER — CARVEDILOL 3.125 MG PO TABS: 3.1250 mg | ORAL_TABLET | Freq: Two times a day (BID) | ORAL | Status: DC

## 2017-04-01 MED ORDER — CHLORHEXIDINE GLUCONATE CLOTH 2 % EX PADS
6.0000 | MEDICATED_PAD | Freq: Every day | CUTANEOUS | Status: DC
  Administered 2017-04-02: 6 via TOPICAL

## 2017-04-01 NOTE — Progress Notes (Signed)
Case discussed with Dr. Ellyn Hack.   71 y/o with anterior STEMI who developed cardiogenic shock. 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 Impella placed for stabilization of shock.   HF team will assume care.   Consult TCTS this am.   Glori Bickers, MD  5:40 AM

## 2017-04-01 NOTE — Progress Notes (Signed)
Pharmacy Antibiotic Note  [name] [name] is a 71 y.o. male admitted on 03/31/2017 with fever, flu-like symptoms.  Pharmacy has been consulted for vancomycin and zosyn dosing.  Respiratory virus panel pending.  Plan: 1. Vancomycin 1250 mg IV q 12 hrs.  Will check trough at steady state as indicated. 2. Zosyn 3.375g IV q 8 hrs (extended-interval infusion). 3. F/u renal function, cultures and clinical course.  Height: 6\' 2"  (188 cm) Weight: 211 lb 3.2 oz (95.8 kg) IBW/kg (Calculated) : 82.2  Temp (24hrs), Avg:101.5 F (38.6 C), Min:100.2 F (37.9 C), Max:102.7 F (39.3 C)  Recent Labs  Lab 03/31/17 1810 03/31/17 2341 04/01/17 0235  WBC 24.4*  --  22.3*  CREATININE 1.24  --  1.27*  LATICACIDVEN  --  1.4 1.6    Estimated Creatinine Clearance: 62 mL/min (A) (by C-G formula based on SCr of 1.27 mg/dL (H)).    No Known Allergies  Antimicrobials this admission:  12/3 Vanc > 12/3 Zosyn >   Dose adjustments this admission:   Microbiology results:  12/3 BCx x 2:  12/3 UCx:  12/2 MRSA PCR:   Thank you for allowing pharmacy to be a part of this patient's care.  [name], BCPS  Clinical Pharmacist Pager [phone]  04/01/2017 12:45 PM

## 2017-04-01 NOTE — Progress Notes (Signed)
[place] for heparin Indication: Impella while awaiting CABG consult  No Known Allergies  Patient Measurements: Height: '6\' 2"'$  (188 cm) Weight: 211 lb 3.2 oz (95.8 kg) IBW/kg (Calculated) : 82.2  Vital Signs: Temp: 101.7 F (38.7 C) (12/03 0800) Temp Source: Core (12/03 0700) BP: 134/93 (12/03 0800) Pulse Rate: 85 (12/03 0800)  Labs: Recent Labs    03/31/17 1810 03/31/17 2341 04/01/17 0235  HGB 14.2  --  13.2  12.9*  HCT 42.2  --  38.5*  38.0*  PLT 226  --  226  APTT 31  --   --   LABPROT 14.4  --   --   INR 1.13  --   --   CREATININE 1.24  --  1.27*  TROPONINI 4.20* 53.99*  --      Estimated Creatinine Clearance: 62 mL/min (A) (by C-G formula based on SCr of 1.27 mg/dL (H)).   Medical History: Past Medical History:  Diagnosis Date  . Bladder cancer ([place])   . Borderline glaucoma   . Diabetes mellitus type 2, controlled ([place]) ORAL MED  . Essential hypertension     Medications:  Medications Prior to Admission  Medication Sig Dispense Refill Last Dose  . amLODipine (NORVASC) 10 MG tablet Take 10 mg by mouth daily.   03/30/2017 at Unknown time  . aspirin 81 MG tablet Take 2 tablets (162 mg total) by mouth daily. (Patient taking differently: Take 325 mg by mouth daily. ) 30 tablet  03/28/2017 at Unknown time  . atorvastatin (LIPITOR) 20 MG tablet Take 20 mg by mouth every morning.    03/30/2017 at Unknown time  . lisinopril (PRINIVIL,ZESTRIL) 20 MG tablet Take 20 mg by mouth every morning.   03/30/2017 at Unknown time  . mometasone (NASONEX) 50 MCG/ACT nasal spray Place 1-2 sprays into the nose daily as needed for allergies.   Past Week at Unknown time  . Phenylephrine-Pheniramine-DM (THERAFLU COLD & COUGH PO) Take by mouth 3 (three) times daily as needed (flu sx).   03/31/2017 at Unknown time  . pioglitazone-metformin (ACTOPLUS MET) 15-500 MG per tablet Take 1 tablet by mouth daily.    03/30/2017 at Unknown time   Scheduled:   . aspirin EC  81 mg Oral Daily  . atorvastatin  80 mg Oral QHS  . insulin aspart  0-20 Units Subcutaneous TID WC  . metoprolol tartrate  25 mg Oral BID  . sodium chloride flush  10-40 mL Intracatheter Q12H  . sodium chloride flush  3 mL Intravenous Q12H   Infusions:  . sodium chloride    . sodium chloride 10 mL/hr (04/01/17 0245)  . impella catheter heparin 50 unit/mL in dextrose 5%    . heparin 800 Units/hr (04/01/17 0515)  . magnesium sulfate 1 - 4 g bolus IVPB 2 g (04/01/17 0912)    Assessment: 71yo male c/o CP and SOB, called code STEMI and taken urgently to cath lab where high-risk PCI was deferred and Impella was placed.  ACTs currently at goal (169) on heparin 800 units/hr systemic + 745 units/hr from Impella purge solution.  Urine is dark amber, likely hemolysis from Impella.  Hgb fairly stable so far.  Goal of Therapy:  Heparin level 0.3-0.7 units/ml  ACT 170-190 sec Monitor platelets by anticoagulation protocol: Yes   Plan:  Will continue monitoring ACT w/ RN and adjust heparin gtt accordingly.  Continue to monitor hemolysis.  [name], BCPS  Clinical Pharmacist Pager [phone]  04/01/2017 9:17 AM

## 2017-04-01 NOTE — Progress Notes (Signed)
cvp now 6/7,  250 cc bolus adm

## 2017-04-01 NOTE — Progress Notes (Signed)
ANTICOAGULATION CONSULT NOTE - Initial Consult  Pharmacy Consult for heparin Indication: Impella while awaiting CABG consult  No Known Allergies  Patient Measurements: Height: _0  (188 cm) Weight: 211 lb 3.2 oz (95.8 kg) IBW/kg (Calculated) : 82.2  Vital Signs: Temp: 100.2 F (37.9 C) (12/03 0400) Temp Source: Core (12/03 0400) BP: 111/83 (12/03 0400) Pulse Rate: 80 (12/03 0400)  Labs: Recent Labs    03/31/17 1810 03/31/17 2341 04/01/17 0235  HGB 14.2  --  13.2  12.9*  HCT 42.2  --  38.5*  38.0*  PLT 226  --  226  APTT 31  --   --   LABPROT 14.4  --   --   INR 1.13  --   --   CREATININE 1.24  --  1.27*  TROPONINI 4.20* 53.99*  --     Estimated Creatinine Clearance: 62 mL/min (A) (by C-G formula based on SCr of 1.27 mg/dL (H)).   Medical History: Past Medical History:  Diagnosis Date  . Bladder cancer ([place])   . Borderline glaucoma   . Diabetes mellitus type 2, controlled ([place]) ORAL MED  . Essential hypertension     Medications:  Medications Prior to Admission  Medication Sig Dispense Refill Last Dose  . amLODipine (NORVASC) 10 MG tablet Take 10 mg by mouth daily.   03/30/2017 at Unknown time  . aspirin 81 MG tablet Take 2 tablets (162 mg total) by mouth daily. (Patient taking differently: Take 325 mg by mouth daily. ) 30 tablet  03/28/2017 at Unknown time  . atorvastatin (LIPITOR) 20 MG tablet Take 20 mg by mouth every morning.    03/30/2017 at Unknown time  . lisinopril (PRINIVIL,ZESTRIL) 20 MG tablet Take 20 mg by mouth every morning.   03/30/2017 at Unknown time  . mometasone (NASONEX) 50 MCG/ACT nasal spray Place 1-2 sprays into the nose daily as needed for allergies.   Past Week at Unknown time  . Phenylephrine-Pheniramine-DM (THERAFLU COLD & COUGH PO) Take by mouth 3 (three) times daily as needed (flu sx).   03/31/2017 at Unknown time  . pioglitazone-metformin (ACTOPLUS MET) 15-500 MG per tablet Take 1 tablet by mouth daily.    03/30/2017 at Unknown time    Scheduled:  . aspirin EC  81 mg Oral Daily  . atorvastatin  20 mg Oral QHS  . insulin aspart  0-20 Units Subcutaneous TID WC  . metoprolol tartrate  25 mg Oral BID  . sodium chloride flush  10-40 mL Intracatheter Q12H  . sodium chloride flush  3 mL Intravenous Q12H   Infusions:  . sodium chloride    . sodium chloride 10 mL/hr (04/01/17 0245)  . furosemide (LASIX) infusion 4 mg/hr (04/01/17 0012)  . impella catheter heparin 50 unit/mL in dextrose 5%    . heparin 800 Units/hr (04/01/17 0515)    Assessment: 71yo male c/o CP and SOB, called code STEMI and taken urgently to cath lab where high-risk PCI was deferred and Impella was placed; pt was started on heparin purge and Dr Ellyn Hack gave heparin 4000 units bolus after sheath was removed 12/2 2333 with plan to start low-dose heparin (goal ACT 170-190 per conversation with Dr Ellyn Hack) until 6hr after sheath removal; Rx has been in close contact w/ RN re: ACTs and systemic heparin infusion was started when ACT was <190; heparin has since been increased from 200 units/hr > 300 units/hr > 500 units/hr > 600 units/hr > 800 units/hr in addition to the 355 units/hr in purge solution (  current total heparin 12 units/kg/hr, which is the appropriate starting dose for regular systemic heparin for ACS).  Goal of Therapy:  Heparin level 0.3-0.7 units/ml  ACT 170-190 sec Monitor platelets by anticoagulation protocol: Yes   Plan:  Will continue monitoring ACT w/ RN and adjust heparin gtt accordingly.  May check heparin levels and monitor CBC.  Wynona Neat, PharmD, BCPS  04/01/2017,5:16 AM

## 2017-04-01 NOTE — Progress Notes (Signed)
  Echocardiogram 2D Echocardiogram has been performed.  [name] [name] 04/01/2017, 11:09 AM

## 2017-04-01 NOTE — Consult Note (Signed)
Advanced Heart Failure Team Consult Note  Reason for Consultation: Cardiogenic shock -> Impella placed overnight 03/31/17 into 04/01/17  HPI:    [name] [name] is seen today for evaluation of cardiogenic shock at the request of Dr. Ellyn Hack.   [name] [name] is a 71 y.o. male with PMH of HTN, DM, and previous hx of bladder cancer.  Pt presented to [facility] 03/31/17 pm with acute dyspnea. Had felt bad since 03/29/17. Pt initially though he had the flu, but also began to have CP and progressive dyspnea. ECG with STEMI in V1-V4 with reciprocal changes inferiorly and laterally. Taken emergently to cath lab.  Cath showed severe native CAD with occluded RCA and extensive calcified disease in the LAD including 2 severe lesions that are not favorable for PCI. Diffuse CAD also noted in Cx that appears 70%.   Pertinent labs on admission include Creatinine 1.24, K 3.6. Troponin peak 53.99, BNP 787.   Impella device placed for support and TCTS consulted for CABG consideration.    Breathing improved. States he had only felt bad for the past ~ week. Prior to that was at his USOH, and had no DOE walking on flat ground, or doing any ADLs. No personal cardiac history prior to admit. States his sister had an MI in her 18s. Current 1 ppd smoker with 40 year pack history. Having pink/reddish blood.   Tmax 102. Remains on droplet precautions. Viral panel pending.   Echo pending. Coox 72.3%  Impella P8, Flow 3.4  Swan numbers PAP 28/24 (poor waveform) CVP 5-6 CO 5.9 CI 2.6  [facility] 03/31/17  Prox RCA lesion is 100% stenosed. Dist RCA lesion is 100% stenosed (retrograde filling from collaterals does not go beyond the distal vessel) -->faint collateral filling to the distal PL and PDA from septal perforators and a diffusely diseased AV groove circumflex  LAV Groove lesion is 90% stenosed.  Prox LAD-1 lesion is 95% stenosed. Prox LAD-2 lesion is 85% stenosed. Heavily calcified, tandem lesions in very  tortuous segment of the vessel.  Prox Cx to Mid Cx lesion is 70% stenosed. Heavily calcified  There is severe left ventricular systolic dysfunction. The left ventricular ejection fraction is less than 25% by visual estimate. RHC Procedural Findings: PAP/mean: 45/27/37 mmHg PCWP: 32-35 mmHg Elevated LV EDP consistent with volume overload.  LVP/EDP: 117/21/37 mmHg  AO sat 89% PA sat 52%. Cardiac output/index (Fick): 4.09, 1.86.   Review of Systems: [y] = yes, '[ ]'$  = no   General: Weight gain '[ ]'$ ; Weight loss '[ ]'$ ; Anorexia '[ ]'$ ; Fatigue [y]; Fever '[ ]'$ ; Chills '[ ]'$ ; Weakness '[ ]'$   Cardiac: Chest pain/pressure [y]; Resting SOB [y]; Exertional SOB [y]; Orthopnea '[ ]'$ ; Pedal Edema [y]; Palpitations '[ ]'$ ; Syncope '[ ]'$ ; Presyncope '[ ]'$ ; Paroxysmal nocturnal dyspnea'[ ]'$   Pulmonary: Cough [y]; Wheezing'[ ]'$ ; Hemoptysis'[ ]'$ ; Sputum '[ ]'$ ; Snoring '[ ]'$   GI: Vomiting'[ ]'$ ; Dysphagia'[ ]'$ ; Melena'[ ]'$ ; Hematochezia '[ ]'$ ; Heartburn'[ ]'$ ; Abdominal pain '[ ]'$ ; Constipation '[ ]'$ ; Diarrhea '[ ]'$ ; BRBPR '[ ]'$   GU: Hematuria'[ ]'$ ; Dysuria '[ ]'$ ; Nocturia'[ ]'$   Vascular: Pain in legs with walking '[ ]'$ ; Pain in feet with lying flat '[ ]'$ ; Non-healing sores '[ ]'$ ; Stroke '[ ]'$ ; TIA '[ ]'$ ; Slurred speech '[ ]'$ ;  Neuro: Headaches'[ ]'$ ; Vertigo'[ ]'$ ; Seizures'[ ]'$ ; Paresthesias'[ ]'$ ;Blurred vision '[ ]'$ ; Diplopia '[ ]'$ ; Vision changes '[ ]'$   Ortho/Skin: Arthritis [y]; Joint pain [y]; Muscle pain '[ ]'$ ; Joint swelling '[ ]'$ ; Back Pain '[ ]'$ ;  Rash '[ ]'$   Psych: Depression'[ ]'$ ; Anxiety'[ ]'$   Heme: Bleeding problems '[ ]'$ ; Clotting disorders '[ ]'$ ; Anemia '[ ]'$   Endocrine: Diabetes [y]; Thyroid dysfunction'[ ]'$   Home Medications Prior to Admission medications   Medication Sig Start Date End Date Taking? Authorizing Provider  amLODipine (NORVASC) 10 MG tablet Take 10 mg by mouth daily. 03/27/17  Yes [provider]  aspirin 81 MG tablet Take 2 tablets (162 mg total) by mouth daily. Patient taking differently: Take 325 mg by mouth daily.  06/01/11  Yes Festus Aloe, MD  atorvastatin  (LIPITOR) 20 MG tablet Take 20 mg by mouth every morning.    Yes [provider]  lisinopril (PRINIVIL,ZESTRIL) 20 MG tablet Take 20 mg by mouth every morning.   Yes [provider]  mometasone (NASONEX) 50 MCG/ACT nasal spray Place 1-2 sprays into the nose daily as needed for allergies. 12/31/16  Yes [provider]  Phenylephrine-Pheniramine-DM [facility] COLD & COUGH PO) Take by mouth 3 (three) times daily as needed (flu sx).   Yes [provider]  pioglitazone-metformin (ACTOPLUS MET) 15-500 MG per tablet Take 1 tablet by mouth daily.    Yes [provider]   Past Medical History: Past Medical History:  Diagnosis Date  . Bladder cancer ([place])   . Borderline glaucoma   . Diabetes mellitus type 2, controlled ([place]) ORAL MED  . Essential hypertension    Past Surgical History: Past Surgical History:  Procedure Laterality Date  . CAROTID DUPLEX SCAN  07-26-2010   BILATERAL ICA  STENOSIS 1% - 39%  . CYSTOSCOPY WITH BIOPSY N/A 08/01/2012   Procedure: CYSTOSCOPY WITH BIOPSY BLADDER BIOPSY   ;  Surgeon: Fredricka Bonine, MD;  Location: [facility];  Service: Urology;  Laterality: N/A;  . FULGURATION OF BLADDER TUMOR N/A 08/01/2012   Procedure: FULGURATION OF BLADDER TUMOR;  Surgeon: Fredricka Bonine, MD;  Location: [facility];  Service: Urology;  Laterality: N/A;  . LEFT HEART CATH AND CORONARY ANGIOGRAPHY N/A 03/31/2017   Procedure: LEFT HEART CATH AND CORONARY ANGIOGRAPHY;  Surgeon: Leonie Man, MD;  Location: [place] [facility];  Service: Cardiovascular;  Laterality: N/A;  . RIGHT HEART CATH Right 03/31/2017   Procedure: RIGHT HEART CATH;  Surgeon: Leonie Man, MD;  Location: [place] [facility];  Service: Cardiovascular;  Laterality: Right;  . TRANSURETHRAL RESECTION OF BLADDER TUMOR  05/29/2011   Procedure: TRANSURETHRAL RESECTION OF BLADDER TUMOR (TURBT);  Surgeon: Fredricka Bonine, MD;   Location: [facility];  Service: Urology;  Laterality: N/A;  . VENTRICULAR ASSIST DEVICE INSERTION Right 03/31/2017   Procedure: VENTRICULAR ASSIST DEVICE INSERTION;  Surgeon: Leonie Man, MD;  Location: [place] [facility];  Service: Cardiovascular;  Laterality: Right;   Family History: Family History  Problem Relation Age of Onset  . Heart attack Sister   . Heart disease Sister    Social History: Social History   Socioeconomic History  . Marital status: Married    Spouse name: None  . Number of children: None  . Years of education: None  . Highest education level: None  Social Needs  . Financial resource strain: None  . Food insecurity - worry: None  . Food insecurity - inability: None  . Transportation needs - medical: None  . Transportation needs - non-medical: None  Occupational History  . None  Tobacco Use  . Smoking status: Current Every Day Smoker    Packs/day: 1.00  Years: 40.00    Pack years: 40.00    Types: Cigarettes  . Smokeless tobacco: Never Used  Substance and Sexual Activity  . Alcohol use: No  . Drug use: No  . Sexual activity: None  Other Topics Concern  . None  Social History Narrative  . None   Allergies:  No Known Allergies  Objective:    Vital Signs:   Temp:  [100.2 F (37.9 C)-102 F (38.9 C)] 101.7 F (38.7 C) (12/03 0800) Pulse Rate:  [76-120] 85 (12/03 0800) Resp:  [15-25] 20 (12/03 0800) BP: (111-134)/(67-96) 134/93 (12/03 0800) SpO2:  [80 %-100 %] 94 % (12/03 0800) Arterial Line BP: (112-159)/(67-77) 150/75 (12/03 0800) FiO2 (%):  [50 %-100 %] 50 % (12/02 2230) Weight:  [211 lb 3.2 oz (95.8 kg)] 211 lb 3.2 oz (95.8 kg) (12/02 2145)   Weight change: Filed Weights   03/31/17 2145  Weight: 211 lb 3.2 oz (95.8 kg)   Intake/Output:   Intake/Output Summary (Last 24 hours) at 04/01/2017 0915 Last data filed at 04/01/2017 0800 Gross per 24 hour  Intake 856.12 ml  Output 1030 ml  Net -173.88 ml      Physical Exam    General:  Well appearing. NAD. Lying in bed.  Neck: supple. RIJ swan. JVP 5-6. Carotids 2+ bilat; no bruits. No lymphadenopathy or thyromegaly appreciated. Cor: PMI nondisplaced. Regular rate & rhythm. No rubs, gallops or murmurs. Lungs: Diminished, clear anteriorly.  Abdomen: soft, nontender, nondistended. No hepatosplenomegaly. No bruits or masses. Good bowel sounds. Extremities: no cyanosis, clubbing, or rash. Trace edema. Groin site stable.  Neuro: alert & orientedx3, cranial nerves grossly intact. moves all 4 extremities w/o difficulty. Affect pleasant  Telemetry   NSR 80s, Personally reviewed.   EKG    NSR this am. Personally reviewed.   Labs   Basic Metabolic Panel: Recent Labs  Lab 03/31/17 1810 04/01/17 0235  NA 131* 132*  134*  K 3.6 4.1  4.1  CL 101 103  CO2 21* 20*  GLUCOSE 184* 156*  157*  BUN 13 16  CREATININE 1.24 1.27*  CALCIUM 8.4* 8.0*  MG  --  1.8   Liver Function Tests: Recent Labs  Lab 03/31/17 1810  AST 58*  ALT 27  ALKPHOS 86  BILITOT 1.3*  PROT 6.1*  ALBUMIN 3.2*   No results for input(s): LIPASE, AMYLASE in the last 168 hours. No results for input(s): AMMONIA in the last 168 hours.  CBC: Recent Labs  Lab 03/31/17 1810 04/01/17 0235  WBC 24.4* 22.3*  NEUTROABS 19.7* 18.1*  HGB 14.2 13.2  12.9*  HCT 42.2 38.5*  38.0*  MCV 89.8 88.1  PLT 226 226   Cardiac Enzymes: Recent Labs  Lab 03/31/17 1810 03/31/17 2341  TROPONINI 4.20* 53.99*   BNP: BNP (last 3 results) Recent Labs    03/31/17 2341  BNP 786.9*   ProBNP (last 3 results) No results for input(s): PROBNP in the last 8760 hours.  CBG: Recent Labs  Lab 04/01/17 0806  GLUCAP 145*   Coagulation Studies: Recent Labs    03/31/17 1810  LABPROT 14.4  INR 1.13   Imaging   Dg Chest Port 1 View  Result Date: 03/31/2017 CLINICAL DATA:  71 year old male with central line placement. EXAM: PORTABLE CHEST 1 VIEW COMPARISON:  Chest  radiograph dated 03/31/2017 FINDINGS: An inferior approach Swan-Ganz with tip in the region of the right pulmonary artery. An Impella device is seen with the pigtail tip over the left ventricle. There  is atherosclerotic calcification of the thoracic aorta. There is diffuse interstitial prominence primarily involving the left lung with Kerley B-lines consistent with interstitial edema. There is no focal consolidation, pleural effusion, or pneumothorax. No acute osseous pathology. IMPRESSION: 1. Interval placement of an inferior approach Swan-Ganz catheter and an Impella device. 2. Increased interstitial prominence consistent with edema. Electronically Signed   By: Anner Crete M.D.   On: 03/31/2017 22:41   Dg Chest Portable 1 View  Result Date: 03/31/2017 CLINICAL DATA:  Chest pain. EXAM: PORTABLE CHEST 1 VIEW COMPARISON:  None. FINDINGS: Cardiomediastinal silhouette is normal. Mediastinal contours appear intact. Heavy calcific atherosclerotic disease of the aorta. There is no evidence of focal airspace consolidation, pleural effusion or pneumothorax. Osseous structures are without acute abnormality. Soft tissues are grossly normal. IMPRESSION: Calcific atherosclerotic disease of the aorta. No evidence of pulmonary edema or airspace consolidation. Electronically Signed   By: Fidela Salisbury M.D.   On: 03/31/2017 18:57    Medications:     Current Medications: . aspirin EC  81 mg Oral Daily  . atorvastatin  80 mg Oral QHS  . insulin aspart  0-20 Units Subcutaneous TID WC  . metoprolol tartrate  25 mg Oral BID  . sodium chloride flush  10-40 mL Intracatheter Q12H  . sodium chloride flush  3 mL Intravenous Q12H    Infusions: . sodium chloride    . sodium chloride 10 mL/hr (04/01/17 0245)  . impella catheter heparin 50 unit/mL in dextrose 5%    . heparin 800 Units/hr (04/01/17 0515)  . magnesium sulfate 1 - 4 g bolus IVPB 2 g (04/01/17 0912)    Patient Profile   71 y/o with anterior  STEMI who developed cardiogenic shock. 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 Impella placed for stabilization of shock.   Assessment/Plan   1. Cardiogenic shock - Impella placed overnight 12/2 into 12/3. Output improved.  - Impella P8, Flow 3.4. Check LDH with blood in urine.  - BP 130-150s Art line and cuff.   2. Acute systolic CHF in setting of STEM - Ischemic CMP. Will need CABG if good candidate.  - Stop lopressor.  No BB for now.  - Consider digoxin and spiro.  - Will add enalapril 2.5 mg BID (short acting) for now. May eventually be good candidate for Entresto.   3. Severe CAD - Consult TCTS for CABG consideration - Hold BB with shock.  4. DM2 - Continue SSI.   5. Hypomagnesemia - 1.8 this am. Will give 2 g.   6. ID - Tmax 102.  - BCx pending. NGTD - Will send UA and Cx and start ABX.  - Repeat CXR  7. Tobacco abuse - Smoked 1 ppd PTA.  - Will continue to encourage complete cessation.   8. HTN - Meds as above. Will treat in the setting of managing his HF.   9. Hx of bladder CA - Urologist has told him he may have intermittent hematuria, but UOP amber on arrival. So ? Hemolysis.  - Pt states he had multiple rounds of Bacillus Calmette-Guerin (BCG) therapy.  Medication concerns reviewed with patient and pharmacy team. Barriers identified: None at this time. Will re-address as meds adjusted.   Length of Stay: 1  Annamaria Helling  04/01/2017, 9:15 AM  Advanced Heart Failure Team Pager [phone] (M-F; 7a - 4p)  Please contact [facility] for night-coverage after hours (4p -7a ) and  weekends on [email or web address]  Patient seen and examined with the above-signed Advanced Practice Provider and/or Housestaff. I personally reviewed laboratory data, imaging studies and relevant notes. I independently examined the patient and formulated the important aspects of the plan. I have edited the note to reflect  any of my changes or salient points. I have personally discussed the plan with the patient and/or family.  71 y/o former [employer] with no h/o known CAD. Recently developed viral illness. Admitted last night with LAD infarct and cardiogenic shock.  Now has Impella in place. Denies CP or SOB. Temp now 40 degrees celcius. Flu and viral panels are negative. Cx drawn. Now on broad spectrum abx  On exam Cor RRR Lungs rhonchi Ab obese NT/ND Ext R femoral impella and swan R DP 2+  Swan numbers done personally. They are improved. Urine dark and suggestive of hemolysis. I repositioned Impella under echo guidance and weaned to P-5. Will continue supportive Care. Case d/w TCTS and possible CABG on Friday if fever and other issues sorted out. Continue heparin. Echo EF 20% (viewed personally).   CRITICAL CARE Performed by: Glori Bickers  Total critical care time: 35 minutes  Critical care time was exclusive of separately billable procedures and treating other patients.  Critical care was necessary to treat or prevent imminent or life-threatening deterioration.  Critical care was time spent personally by me (independent of midlevel providers or residents) on the following activities: development of treatment plan with patient and/or surrogate as well as nursing, discussions with consultants, evaluation of patient's response to treatment, examination of patient, obtaining history from patient or surrogate, ordering and performing treatments and interventions, ordering and review of laboratory studies, ordering and review of radiographic studies, pulse oximetry and re-evaluation of patient's condition.  Glori Bickers, MD  3:36 PM

## 2017-04-01 NOTE — Progress Notes (Signed)
Decrease Impella to P4 and ordered LDH in AM per Oda Kilts PA

## 2017-04-02 ENCOUNTER — Inpatient Hospital Stay (HOSPITAL_COMMUNITY): Payer: Medicare Other

## 2017-04-02 ENCOUNTER — Encounter (HOSPITAL_COMMUNITY): Payer: Medicare Other

## 2017-04-02 DIAGNOSIS — I213 ST elevation (STEMI) myocardial infarction of unspecified site: Secondary | ICD-10-CM

## 2017-04-02 DIAGNOSIS — A419 Sepsis, unspecified organism: Secondary | ICD-10-CM

## 2017-04-02 DIAGNOSIS — R509 Fever, unspecified: Secondary | ICD-10-CM

## 2017-04-02 DIAGNOSIS — R6521 Severe sepsis with septic shock: Secondary | ICD-10-CM

## 2017-04-02 DIAGNOSIS — D72829 Elevated white blood cell count, unspecified: Secondary | ICD-10-CM

## 2017-04-02 DIAGNOSIS — I25110 Atherosclerotic heart disease of native coronary artery with unstable angina pectoris: Secondary | ICD-10-CM

## 2017-04-02 LAB — BLOOD GAS, ARTERIAL
Acid-base deficit: 5.1 mmol/L — ABNORMAL HIGH (ref 0.0–2.0)
Bicarbonate: 18.3 mmol/L — ABNORMAL LOW (ref 20.0–28.0)
O2 CONTENT: 6 L/min
O2 SAT: 93 %
PATIENT TEMPERATURE: 98.6
pCO2 arterial: 26.9 mmHg — ABNORMAL LOW (ref 32.0–48.0)
pH, Arterial: 7.446 (ref 7.350–7.450)
pO2, Arterial: 64.4 mmHg — ABNORMAL LOW (ref 83.0–108.0)

## 2017-04-02 LAB — BASIC METABOLIC PANEL
ANION GAP: 13 (ref 5–15)
BUN: 21 mg/dL — ABNORMAL HIGH (ref 6–20)
CALCIUM: 7.7 mg/dL — AB (ref 8.9–10.3)
CO2: 16 mmol/L — AB (ref 22–32)
CREATININE: 1.27 mg/dL — AB (ref 0.61–1.24)
Chloride: 99 mmol/L — ABNORMAL LOW (ref 101–111)
GFR calc non Af Amer: 55 mL/min — ABNORMAL LOW (ref >60)
Glucose, Bld: 133 mg/dL — ABNORMAL HIGH (ref 65–99)
Potassium: 4.1 mmol/L (ref 3.5–5.1)
SODIUM: 128 mmol/L — AB (ref 135–145)

## 2017-04-02 LAB — CBC WITH DIFFERENTIAL/PLATELET
BASOS ABS: 0 10*3/uL (ref 0.0–0.1)
BASOS PCT: 0 %
EOS ABS: 0 10*3/uL (ref 0.0–0.7)
Eosinophils Relative: 0 %
HCT: 36.8 % — ABNORMAL LOW (ref 39.0–52.0)
HEMOGLOBIN: 12.6 g/dL — AB (ref 13.0–17.0)
LYMPHS PCT: 4 %
Lymphs Abs: 1.6 10*3/uL (ref 0.7–4.0)
MCH: 29.8 pg (ref 26.0–34.0)
MCHC: 34.2 g/dL (ref 30.0–36.0)
MCV: 87 fL (ref 78.0–100.0)
MONO ABS: 4.3 10*3/uL — AB (ref 0.1–1.0)
Monocytes Relative: 11 %
NEUTROS PCT: 85 %
Neutro Abs: 32.9 10*3/uL — ABNORMAL HIGH (ref 1.7–7.7)
PLATELETS: 200 10*3/uL (ref 150–400)
RBC: 4.23 MIL/uL (ref 4.22–5.81)
RDW: 13.8 % (ref 11.5–15.5)
WBC: 38.8 10*3/uL — AB (ref 4.0–10.5)

## 2017-04-02 LAB — LACTIC ACID, PLASMA: Lactic Acid, Venous: 1.4 mmol/L (ref 0.5–1.9)

## 2017-04-02 LAB — POCT ACTIVATED CLOTTING TIME
ACTIVATED CLOTTING TIME: 169 s
ACTIVATED CLOTTING TIME: 169 s
ACTIVATED CLOTTING TIME: 164 s
Activated Clotting Time: 169 seconds
Activated Clotting Time: 169 seconds
Activated Clotting Time: 180 seconds
Activated Clotting Time: 191 seconds

## 2017-04-02 LAB — HEMOGLOBIN A1C
Hgb A1c MFr Bld: 6.3 % — ABNORMAL HIGH (ref 4.8–5.6)
Mean Plasma Glucose: 134 mg/dL

## 2017-04-02 LAB — CALCIUM, IONIZED: Calcium, Ionized, Serum: 4.4 mg/dL — ABNORMAL LOW (ref 4.5–5.6)

## 2017-04-02 LAB — GLUCOSE, CAPILLARY
GLUCOSE-CAPILLARY: 129 mg/dL — AB (ref 65–99)
GLUCOSE-CAPILLARY: 124 mg/dL — AB (ref 65–99)
Glucose-Capillary: 168 mg/dL — ABNORMAL HIGH (ref 65–99)

## 2017-04-02 LAB — MAGNESIUM: MAGNESIUM: 2 mg/dL (ref 1.7–2.4)

## 2017-04-02 LAB — LACTATE DEHYDROGENASE: LDH: 1161 U/L — ABNORMAL HIGH (ref 98–192)

## 2017-04-02 LAB — CORTISOL: CORTISOL PLASMA: 34.8 ug/dL

## 2017-04-02 LAB — PROCALCITONIN: Procalcitonin: 6.43 ng/mL

## 2017-04-02 MED ORDER — CHLORHEXIDINE GLUCONATE 0.12 % MT SOLN
15.0000 mL | Freq: Two times a day (BID) | OROMUCOSAL | Status: DC
  Administered 2017-04-02 – 2017-04-03 (×3): 15 mL via OROMUCOSAL
  Filled 2017-04-02 (×2): qty 15

## 2017-04-02 MED ORDER — PHENYLEPHRINE HCL 10 MG/ML IJ SOLN
0.0000 ug/min | INTRAMUSCULAR | Status: DC
  Filled 2017-04-02: qty 1

## 2017-04-02 MED ORDER — HEPARIN (PORCINE) IN NACL 100-0.45 UNIT/ML-% IJ SOLN
200.0000 [IU]/h | INTRAMUSCULAR | Status: DC
  Administered 2017-04-02: 350 [IU]/h via INTRAVENOUS
  Filled 2017-04-02: qty 250

## 2017-04-02 MED ORDER — ORAL CARE MOUTH RINSE
15.0000 mL | Freq: Two times a day (BID) | OROMUCOSAL | Status: DC
  Administered 2017-04-02 – 2017-04-03 (×3): 15 mL via OROMUCOSAL

## 2017-04-02 MED ORDER — CHLORHEXIDINE GLUCONATE CLOTH 2 % EX PADS
6.0000 | MEDICATED_PAD | Freq: Every morning | CUTANEOUS | Status: DC
  Administered 2017-04-03 – 2017-04-06 (×4): 6 via TOPICAL

## 2017-04-02 MED ORDER — SODIUM CHLORIDE 0.9 % IV BOLUS (SEPSIS)
500.0000 mL | Freq: Once | INTRAVENOUS | Status: AC
  Administered 2017-04-02: 500 mL via INTRAVENOUS

## 2017-04-02 MED ORDER — NOREPINEPHRINE BITARTRATE 1 MG/ML IV SOLN
0.0000 ug/min | INTRAVENOUS | Status: DC
  Filled 2017-04-02: qty 4

## 2017-04-02 MED ORDER — HYDROCORTISONE NA SUCCINATE PF 100 MG IJ SOLR
50.0000 mg | Freq: Four times a day (QID) | INTRAMUSCULAR | Status: DC
  Administered 2017-04-02 – 2017-04-03 (×5): 50 mg via INTRAVENOUS
  Filled 2017-04-02 (×5): qty 2

## 2017-04-02 NOTE — Progress Notes (Signed)
cvp 7/8, 250cc NS Bolus adm

## 2017-04-02 NOTE — Progress Notes (Signed)
Advanced Heart Failure Rounding Note  Subjective:    Admitted 03/31/17 with CP -> STEMI -> Cath with severe 3Vd. 04/01/17 developed fever up to 104. Cultures drawn  Impella turned down to P4 04/01/17 evening with elevated LDH. LDH downtrending today but still > 1000.     Had very difficult night with continued fevers +/- rigors. Very uncomfortable and minimal relief with morphine.   Feeling fatigued.  Denies SOB. No CP. More comfortable this am after morphine overnight.   Impella p4, Flow 2.6, no alarms.  LDH 1293 -> 1176  CVP 9.   WBC 38.8. Tmax 104.2. PCT 6.43  Swan numbers CVP 10 CO 8.57 CI 3.86 PAP 24/17 (20) SVR 541  Objective:   Weight Range: 215 lb 13.3 oz (97.9 kg) Body mass index is 27.71 kg/m.   Vital Signs:   Temp:  [98.8 F (37.1 C)-104.2 F (40.1 C)] 100.2 F (37.9 C) (12/04 0700) Pulse Rate:  [78-111] 103 (12/04 0700) Resp:  [13-30] 15 (12/04 0700) BP: (102-146)/(53-94) 112/53 (12/04 0700) SpO2:  [89 %-100 %] 100 % (12/04 0700) Arterial Line BP: (114-213)/(53-107) 114/53 (12/04 0700) Weight:  [215 lb 13.3 oz (97.9 kg)] 215 lb 13.3 oz (97.9 kg) (12/04 0400)   Weight change: Filed Weights   03/31/17 2145 04/02/17 0400  Weight: 211 lb 3.2 oz (95.8 kg) 215 lb 13.3 oz (97.9 kg)   Intake/Output:   Intake/Output Summary (Last 24 hours) at 04/02/2017 0717 Last data filed at 04/02/2017 0700 Gross per 24 hour  Intake 2723.95 ml  Output 980 ml  Net 1743.95 ml    Physical Exam    General:  Critically ill appearing, though NAD.  HEENT: Flushed Neck: Supple. JVP 9-10 cm +. Carotids 2+ bilat; no bruits. No lymphadenopathy or thyromegaly appreciated. Cor: PMI nondisplaced. Regular rate & rhythm. No rubs, gallops or murmurs. + impella noises Lungs: Diminished, ? Expiratory wheeze L>R Abdomen: Soft, nontender, nondistended. No hepatosplenomegaly. No bruits or masses. Good bowel sounds. Extremities: No cyanosis, clubbing or rash. Warm. R groin site  stable.  Neuro: Alert & orientedx3, cranial nerves grossly intact. moves all 4 extremities w/o difficulty. Affect pleasant  Telemetry   NSR- ST trending up from 80s to 100s, Occasional PVCs and PACs, Personally reviewed.   EKG   No new tracing  Labs    CBC Recent Labs    04/01/17 0235 04/02/17 0321  WBC 22.3* 38.8*  NEUTROABS 18.1* 32.9*  HGB 13.2  12.9* 12.6*  HCT 38.5*  38.0* 36.8*  MCV 88.1 87.0  PLT 226 379   Basic Metabolic Panel Recent Labs    04/01/17 0235 04/02/17 0321  NA 132*  134* 128*  K 4.1  4.1 4.1  CL 103 99*  CO2 20* 16*  GLUCOSE 156*  157* 133*  BUN 16 21*  CREATININE 1.27* 1.27*  CALCIUM 8.0* 7.7*  MG 1.8 2.0   Liver Function Tests Recent Labs    03/31/17 1810  AST 58*  ALT 27  ALKPHOS 86  BILITOT 1.3*  PROT 6.1*  ALBUMIN 3.2*   No results for input(s): LIPASE, AMYLASE in the last 72 hours. Cardiac Enzymes Recent Labs    03/31/17 1810 03/31/17 2341  TROPONINI 4.20* 53.99*    BNP: BNP (last 3 results) Recent Labs    03/31/17 2341  BNP 786.9*    ProBNP (last 3 results) No results for input(s): PROBNP in the last 8760 hours.   D-Dimer No results for input(s): DDIMER in the last 72  hours. Hemoglobin A1C Recent Labs    03/31/17 2347  HGBA1C 6.3*   Fasting Lipid Panel Recent Labs    04/01/17 0235  CHOL 129  HDL 28*  LDLCALC 82  TRIG 93  CHOLHDL 4.6   Thyroid Function Tests No results for input(s): TSH, T4TOTAL, T3FREE, THYROIDAB in the last 72 hours.  Invalid input(s): FREET3  Other results:   Imaging    Dg Chest Port 1 View  Result Date: 04/01/2017 CLINICAL DATA:  Fever to 102 degrees, bladder cancer, type II diabetes mellitus, hypertension EXAM: PORTABLE CHEST 1 VIEW COMPARISON:  Portable exam 1302 hours compared to 03/31/2017 FINDINGS: Impella device in LEFT ventricle and aorta. Enlargement of cardiac silhouette with pulmonary vascular congestion. Asymmetric pulmonary interstitial infiltrates  LEFT greater than RIGHT favoring asymmetric edema, unchanged. Interval removal of Swan-Ganz catheter. Atherosclerotic calcification aorta. No pleural effusion or pneumothorax. IMPRESSION: Persistent probable asymmetric pulmonary edema. Electronically Signed   By: Lavonia Dana M.D.   On: 04/01/2017 13:21      Medications:     Scheduled Medications: . aspirin EC  81 mg Oral Daily  . atorvastatin  80 mg Oral QHS  . Chlorhexidine Gluconate Cloth  6 each Topical Q0600  . enalapril  2.5 mg Oral BID  . insulin aspart  0-20 Units Subcutaneous TID WC  . sodium chloride flush  10-40 mL Intracatheter Q12H  . sodium chloride flush  3 mL Intravenous Q12H     Infusions: . sodium chloride 250 mL/hr (04/02/17 0622)  . sodium chloride 20 mL/hr at 04/01/17 2000  . impella catheter heparin 50 unit/mL in dextrose 5%    . heparin 350 Units/hr (04/02/17 0600)  . piperacillin-tazobactam (ZOSYN)  IV 3.375 g (04/02/17 0520)  . vancomycin Stopped (04/02/17 0208)     PRN Medications:  sodium chloride, acetaminophen, hydrALAZINE, morphine injection, nitroGLYCERIN, ondansetron (ZOFRAN) IV, sodium chloride flush, sodium chloride flush   Patient Profile   71 y/o with anterior STEMI who developed cardiogenic shock. 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 Impella placed for stabilization of shock.   Assessment/Plan   1. Cardiogenic shock - Impella placed overnight 12/2 into 12/3. Output improved.  - Impella P4, Flow 2.6. Waveform OK.  - LDH 1293 -> 1161 - BP trending down. Levophed in place for as needed.   2. ID - Tmax 104. Remains on Vanc/Zosyn - CCM consulted.  - Add levophed as needed - UCx and BCx pending - Sputum culture sent from very large sample this am.  - Bicarb low. Will give IVF and follow fluid status closely.   3. Acute systolic CHF in setting of STEMI: Echo with EF 15-20%.  - Ischemic CMP. Will need CABG if good candidate.  Awaiting TCTS consult. - Output improved this am, but worsened clinically with marked WBC and fever. CCM consult pending.  - No BB for now.  - Consider digoxin and spiro.  - Stop enalapril for now.   4. Severe CAD - Awaiting TCTS consult for CABG consideration.  Denies CP. - Hold BB with shock.  5. DM2 - Continue SSI. No change.   6. Hypomagnesemia - Stable after supp.   7. Tobacco abuse - Smoked 1 ppd PTA.  - Will continue to encourage complete cessation. No change.   8. HTN - Meds as above. Will treat in the setting of managing his HF. No change.   9. Hx of bladder CA - Urologist has told him  he may have intermittent hematuria, but UOP amber on arrival. So ? Hemolysis.  - Pt states he had multiple rounds of Bacillus Calmette-Guerin (BCG) therapy. No change.  Patient is critically ill and remains in imminent danger of multiple organ failure.   Medication concerns reviewed with patient and pharmacy team. Barriers identified: None at this time.   Length of Stay: 2  Annamaria Helling  04/02/2017, 7:17 AM  Advanced Heart Failure Team Pager [phone] (M-F; 7a - 4p)  Please contact [facility] for night-coverage after hours (4p -7a ) and weekends on [email or web address]  Patient seen with PA, agree with the above note.  I made adjustments to reflect my thoughts.   On exam, no JVP.  Impella sounds on chest exam.  Pedal pulses dopplerable.  No edema.   He had a rough night with fever to 104 and rigors, WBCs 38.8 and PCT 6.4.  Better this morning.  No dyspnea or chest pain.  Today, CI is 3.9 from swan. CVP is 9.  SVR very low at 541.  Baseline cardiogenic shock post-MI, now suspect component of septic shock with low SVR.  Possible RUL PNA as source.  - BP not low, no need for pressors. - Awaiting culture data.  - Continue vancomycin/Zosyn.  - Seen by CCM, hydrocortisone begun.   He remains Impella at P4, flow 2.6 L/min.  CI 3.9 from [place] (good).  No inotropes needed.   Impella repositioned and output decreased yesterday with elevated LDH.  LDH lower today (may be high due to hemolysis from Impella but inflammatory response with septic shock may also play a role).  Follow LDH.   When he recovers from infection standpoint, he will need CABG.  TCTS to see.   CRITICAL CARE Performed by: Loralie Champagne  Total critical care time: 35 minutes  Critical care time was exclusive of separately billable procedures and treating other patients.  Critical care was necessary to treat or prevent imminent or life-threatening deterioration.  Critical care was time spent personally by me on the following activities: development of treatment plan with patient and/or surrogate as well as nursing, discussions with consultants, evaluation of patient's response to treatment, examination of patient, obtaining history from patient or surrogate, ordering and performing treatments and interventions, ordering and review of laboratory studies, ordering and review of radiographic studies, pulse oximetry and re-evaluation of patient's condition.  Loralie Champagne 04/02/2017 9:53 AM

## 2017-04-02 NOTE — Progress Notes (Deleted)
Pharmacy Antibiotic Note  [name] [name] is a 71 y.o. male admitted on 03/31/2017 with fever, flu-like symptoms.  Pharmacy has been consulted for vancomycin and zosyn dosing.  Respiratory virus panel pending.  Plan: 1. Vancomycin 1250 mg IV q 12 hrs.  Will check trough at steady state as indicated. 2. Zosyn 3.375g IV q 8 hrs (extended-interval infusion). 3. F/u renal function, cultures and clinical course.  Height: 6\' 2"  (188 cm) Weight: 215 lb 13.3 oz (97.9 kg)(with cooling blanket/water on bed) IBW/kg (Calculated) : 82.2  Temp (24hrs), Avg:101.4 F (38.6 C), Min:98.8 F (37.1 C), Max:104.2 F (40.1 C)  Recent Labs  Lab 03/31/17 1810 03/31/17 2341 04/01/17 0235 04/02/17 0321  WBC 24.4*  --  22.3* 38.8*  CREATININE 1.24  --  1.27* 1.27*  LATICACIDVEN  --  1.4 1.6  --     Estimated Creatinine Clearance: 62 mL/min (A) (by C-G formula based on SCr of 1.27 mg/dL (H)).    No Known Allergies  Antimicrobials this admission:  12/3 Vanc > 12/3 Zosyn >   Dose adjustments this admission:   Microbiology results:  12/3 BCx x 2:  12/3 UCx:  12/2 MRSA PCR:   Thank you for allowing pharmacy to be a part of this patient's care.  [name], BCPS  Clinical Pharmacist Pager [phone]  04/02/2017 7:47 AM

## 2017-04-02 NOTE — Progress Notes (Signed)
Patients temp is back up with cooling blanket on, CVP transducer was held up to phlebostatic level with just as much discrepancy as on the card,will use CVP of 8/9 for cardaic output numbers. Patient is shaking due to coldness and is completely miserable, Morphine given 30 minutes ago due to being so uncomfortable with cooling blanket, warm blankets have been applied despite fever trying to stop shivering, respiratory sputum sent for culture, patient coughed up thick thick tan secretions, earlier sputum was rejected. Pharmacy was contracted as well due to Heparins low  range dose was 400 and according to ACT, Heparin due to at 350, pharmacist to make scale changes for Heparin.

## 2017-04-02 NOTE — Progress Notes (Signed)
[facility] for heparin Indication: Impella while awaiting CABG consult  No Known Allergies  Patient Measurements: Height: 6\' 2"  (188 cm) Weight: 215 lb 13.3 oz (97.9 kg)(with cooling blanket/water on bed) IBW/kg (Calculated) : 82.2  Vital Signs: Temp: 100.2 F (37.9 C) (12/04 0700) Temp Source: Core (12/04 0600) BP: 112/53 (12/04 0700) Pulse Rate: 103 (12/04 0700)  Labs: Recent Labs    03/31/17 1810 03/31/17 2341 04/01/17 0235 04/02/17 0321  HGB 14.2  --  13.2  12.9* 12.6*  HCT 42.2  --  38.5*  38.0* 36.8*  PLT 226  --  226 200  APTT 31  --   --   --   LABPROT 14.4  --   --   --   INR 1.13  --   --   --   CREATININE 1.24  --  1.27* 1.27*  TROPONINI 4.20* 53.99*  --   --      Estimated Creatinine Clearance: 62 mL/min (A) (by C-G formula based on SCr of 1.27 mg/dL (H)).   Medications:   Scheduled:  . aspirin EC  81 mg Oral Daily  . atorvastatin  80 mg Oral QHS  . chlorhexidine  15 mL Mouth Rinse BID  . Chlorhexidine Gluconate Cloth  6 each Topical Q0600  . insulin aspart  0-20 Units Subcutaneous TID WC  . mouth rinse  15 mL Mouth Rinse q12n4p  . sodium chloride flush  10-40 mL Intracatheter Q12H  . sodium chloride flush  3 mL Intravenous Q12H   Infusions:  . sodium chloride 250 mL/hr (04/02/17 0622)  . sodium chloride 20 mL/hr at 04/01/17 2000  . impella catheter heparin 50 unit/mL in dextrose 5%    . heparin 350 Units/hr (04/02/17 0600)  . norepinephrine (LEVOPHED) Adult infusion    . piperacillin-tazobactam (ZOSYN)  IV 3.375 g (04/02/17 0520)  . vancomycin Stopped (04/02/17 7989)    Assessment: 71yo male c/o CP and SOB, called code STEMI and taken urgently to cath lab where high-risk PCI was deferred and Impella was placed.  ACTs currently at goal (169) on heparin 350 units/hr systemic + 780 units/hr from Impella purge solution.  12/3 -Urine is dark amber, likely hemolysis from Impella.  Today's urine more clear.   Hgb fairly stable so far.  Goal of Therapy:   ACT 160-180 sec Monitor platelets by anticoagulation protocol: Yes   Plan:  Will continue monitoring ACT w/ RN and adjust heparin gtt accordingly.  Continue to monitor hemolysis.  [name] [name], BCPS  Clinical Pharmacist Pager [phone]  04/02/2017 7:48 AM

## 2017-04-02 NOTE — Consult Note (Signed)
[facility] / CRITICAL CARE MEDICINE   Name: COUNCIL MUNGUIA MRN: [id number] DOB: Sep 08, 1945    ADMISSION DATE:  03/31/2017 CONSULTATION DATE:  04/02/2017  REFERRING MD: Oda Kilts, PA  CHIEF COMPLAINT: Sepsis  HISTORY OF PRESENT ILLNESS:   JORDAN PARDINI is a 71 y.o. male with PMH of HTN, DM, and previous hx of bladder cancer.  Pt presented to [facility] 03/31/17 pm with acute dyspnea. Had felt bad since 03/29/17. Pt initially though he had the flu, but also began to have CP and progressive dyspnea. ECG with STEMI in V1-V4 with reciprocal changes inferiorly and laterally. Troponin Peak 53.99, BNP 787. Taken emergently to cath lab.  Cath showed severe native CAD with occluded RCA and extensive calcified disease in the LAD including 2 severe lesions that are not favorable for PCI. Diffuse CAD also noted in Cx that appears 70%. Impella device placed for support and TCTS consulted for CABG consideration.  P4, Flow 2.4  Breathing initially  improved. States he had only felt bad for the past ~ week. Prior to that was at his USOH, and had no DOE walking on flat ground, or doing any ADLs. No personal cardiac history prior to admit. States his sister had an MI in her 40s. Current 1 ppd smoker with 40 year pack history. Pt was having  pink/reddish blood tinged sputum. He started spiking temps 04/01/2017 with T max of 104.2, SVR fell from 1100 to 541 overnight. He was initially diuresed for cardiogenic shock 12/3. There is concern for septic shock 12/4, and patient is receiving 500 cc  fluid bolus, BP is soft. PCCM was asked to see patient for assistance in management of suspected septic shock, ? Pulmonary source.   PAST MEDICAL HISTORY :  He  has a past medical history of Bladder cancer ([place]), Borderline glaucoma, Diabetes mellitus type 2, controlled ([place]) (ORAL MED), and Essential hypertension.  PAST SURGICAL HISTORY: He  has a past surgical history that includes CAROTID DUPLEX SCAN (07-26-2010);  Transurethral resection of bladder tumor (05/29/2011); Cystoscopy with biopsy (N/A, 08/01/2012); Fulguration of bladder tumor (N/A, 08/01/2012); LEFT HEART CATH AND CORONARY ANGIOGRAPHY (N/A, 03/31/2017); RIGHT HEART CATH (Right, 03/31/2017); and VENTRICULAR ASSIST DEVICE INSERTION (Right, 03/31/2017).  No Known Allergies  No current facility-administered medications on file prior to encounter.    Current Outpatient Medications on File Prior to Encounter  Medication Sig  . amLODipine (NORVASC) 10 MG tablet Take 10 mg by mouth daily.  [name] aspirin 81 MG tablet Take 2 tablets (162 mg total) by mouth daily. (Patient taking differently: Take 325 mg by mouth daily. )  . atorvastatin (LIPITOR) 20 MG tablet Take 20 mg by mouth every morning.   [name] lisinopril (PRINIVIL,ZESTRIL) 20 MG tablet Take 20 mg by mouth every morning.  . mometasone (NASONEX) 50 MCG/ACT nasal spray Place 1-2 sprays into the nose daily as needed for allergies.  . Phenylephrine-Pheniramine-DM (THERAFLU COLD & COUGH PO) Take by mouth 3 (three) times daily as needed (flu sx).  . pioglitazone-metformin (ACTOPLUS MET) 15-500 MG per tablet Take 1 tablet by mouth daily.     FAMILY HISTORY:  His indicated that the status of his sister is unknown.   SOCIAL HISTORY: He  reports that he has been smoking cigarettes.  He has a 40.00 pack-year smoking history. he has never used smokeless tobacco. He reports that he does not drink alcohol or use drugs.  REVIEW OF SYSTEMS:   Gen: + fever,+ chills, no weight change,+ fatigue,no  night sweats HEENT:  Denies blurred vision, double vision, hearing loss, tinnitus, sinus congestion, rhinorrhea, sore throat, neck stiffness, dysphagia PULM:  + shortness of breath, + cough, +sputum production, no hemoptysis, some wheezing CV: Denies chest pain, edema, orthopnea, paroxysmal nocturnal dyspnea, palpitations GI: Occasional left  abdominal pain, no nausea, vomiting, diarrhea, hematochezia, melena, constipation,  change in bowel habits, decreased appetite GU: Denies dysuria, hematuria, polyuria, oliguria, urethral discharge Endocrine: Denies hot or cold intolerance, polyuria, polyphagia or appetite change Derm: Denies rash, dry skin, scaling or peeling skin change Heme: Denies easy bruising, bleeding, bleeding gums Neuro: Denies headache, numbness, weakness, slurred speech, loss of memory or consciousness  SUBJECTIVE:  Awake and alert on 6 L [place], with Impala device  in place, speaking in full sentences, in NAD at present. Protecting airway at present. Able to answer questions appropriately.  VITAL SIGNS: BP 104/64   Pulse 100   Temp (!) 100.4 F (38 C)   Resp 15   Ht '6\' 2"'$  (1.88 m)   Wt 215 lb 13.3 oz (97.9 kg) Comment: with cooling blanket/water on bed  SpO2 97%   BMI 27.71 kg/m   HEMODYNAMICS: PAP: (24-49)/(14-27) 28/22 CVP:  [4 mmHg-12 mmHg] 10 mmHg PCWP:  [17 mmHg-23 mmHg] 17 mmHg CO:  [5.8 L/min-8.7 L/min] 8.6 L/min CI:  [2.6 L/min/m2-3.9 L/min/m2] 3.9 L/min/m2   INTAKE / OUTPUT: I/O last 3 completed shifts: In: 3543.2 [P.O.:820; I.V.:1145.6; Other:677.6; IV Piggyback:900] Out: 1885 [Urine:1885]  PHYSICAL EXAMINATION: General:  Awake and alert, supine in bed, Impala device/Swan with Cordis  per Right Groin Neuro:  Awake and alert, MAE x 4, Alert and oriented x 3, appropriate affect HEENT:  Normocephalic, atraumatic, PERRLA, No LAD, No JVD Cardiovascular: Assist device sounds>>S1, S2, RRR, No RMG, pulses palpable, no mottling noted Lungs: Diminished per bases, faint expiratory wheeze noted RUL Abdomen: Slightly distended, BS diminished, non-tender to palpation Musculoskeletal:  No obvious deformities, no cyanosis, clubbing, Right Groin site stable Skin:  Warm dry and intact, no rash or lesions noted  LABS:  BMET Recent Labs  Lab 03/31/17 1810 04/01/17 0235 04/02/17 0321  NA 131* 132*  134* 128*  K 3.6 4.1  4.1 4.1  CL 101 103 99*  CO2 21* 20* 16*  BUN 13 16 21*   CREATININE 1.24 1.27* 1.27*  GLUCOSE 184* 156*  157* 133*    Electrolytes Recent Labs  Lab 03/31/17 1810 04/01/17 0235 04/02/17 0321  CALCIUM 8.4* 8.0* 7.7*  MG  --  1.8 2.0    CBC Recent Labs  Lab 03/31/17 1810 04/01/17 0235 04/02/17 0321  WBC 24.4* 22.3* 38.8*  HGB 14.2 13.2  12.9* 12.6*  HCT 42.2 38.5*  38.0* 36.8*  PLT 226 226 200    Coag's Recent Labs  Lab 03/31/17 1810  APTT 31  INR 1.13    Sepsis Markers Recent Labs  Lab 03/31/17 2341 04/01/17 0235 04/01/17 1206 04/02/17 0321  LATICACIDVEN 1.4 1.6  --   --   PROCALCITON  --   --  0.42 6.43    ABG Recent Labs  Lab 03/31/17 2014 04/01/17 0415 04/02/17 0500  PHART 7.423 7.472* 7.446  PCO2ART 31.0* 29.6* 26.9*  PO2ART 86.0 70.6* 64.4*    Liver Enzymes Recent Labs  Lab 03/31/17 1810  AST 58*  ALT 27  ALKPHOS 86  BILITOT 1.3*  ALBUMIN 3.2*    Cardiac Enzymes Recent Labs  Lab 03/31/17 1810 03/31/17 2341  TROPONINI 4.20* 53.99*    Glucose Recent Labs  Lab 04/01/17 0806 04/01/17 1136 04/01/17  1617 04/01/17 2124 04/02/17 0743  GLUCAP 145* 186* 146* 181* 168*    Imaging Dg Chest Port 1 View  Result Date: 04/02/2017 CLINICAL DATA:  Respiratory difficulty, fever EXAM: PORTABLE CHEST 1 VIEW COMPARISON:  04/01/2017 FINDINGS: Swan-Ganz catheter tip remains in the right lower lobe pulmonary artery. Mild cardiomegaly and vascular congestion. Diffuse interstitial prominence throughout the lungs, left greater than right again noted. This is increased in the right upper lobe since prior study. This could reflect asymmetric edema or infection. IMPRESSION: Bilateral interstitial disease, left greater than right, worsening in the right upper lobe. This could reflect worsening edema or infection. Electronically Signed   By: Rolm Baptise M.D.   On: 04/02/2017 08:10   Dg Chest Port 1 View  Result Date: 04/01/2017 CLINICAL DATA:  Fever to 102 degrees, bladder cancer, type II diabetes  mellitus, hypertension EXAM: PORTABLE CHEST 1 VIEW COMPARISON:  Portable exam 1302 hours compared to 03/31/2017 FINDINGS: Impella device in LEFT ventricle and aorta. Enlargement of cardiac silhouette with pulmonary vascular congestion. Asymmetric pulmonary interstitial infiltrates LEFT greater than RIGHT favoring asymmetric edema, unchanged. Interval removal of Swan-Ganz catheter. Atherosclerotic calcification aorta. No pleural effusion or pneumothorax. IMPRESSION: Persistent probable asymmetric pulmonary edema. Electronically Signed   By: Lavonia Dana M.D.   On: 04/01/2017 13:21     STUDIES:  04/02/2017 CXR:  Bilateral interstitial disease, left greater than right, worsening in the right upper lobe. This could reflect worsening edema or Infection.  12/3 Echo: EF 15-20%, with diffuse hypokinesis, Grade I diastolic Dysfunction, Aortic Valve moderate regurg   [facility] 03/31/17  Prox RCA lesion is 100% stenosed. Dist RCA lesion is 100% stenosed (retrograde filling from collaterals does not go beyond the distal vessel) -->faint collateral filling to the distal PL and PDA from septal perforators and a diffusely diseased AV groove circumflex  LAV Groove lesion is 90% stenosed.  Prox LAD-1 lesion is 95% stenosed. Prox LAD-2 lesion is 85% stenosed. Heavily calcified, tandem lesions in very tortuous segment of the vessel.  Prox Cx to Mid Cx lesion is 70% stenosed. Heavily calcified  There is severe left ventricular systolic dysfunction. The left ventricular ejection fraction is less than 25% by visual estimate.   [facility] Procedural Findings: PAP/mean: 45/27/37 mmHg PCWP: 32-35 mmHg Elevated LV EDP consistent with volume overload.  LVP/EDP: 117/21/37 mmHg  AO sat 89% PA sat 52%. Cardiac output/index (Fick): 4.09, 1.86.   CULTURES: 12/2>> Viral Panel>> Negative 12/3>> Blood>> 12/3>> Urine>> 12/4>> Respiratory Culture>>   ANTIBIOTICS: 04/01/2017>> Vanc 04/01/2017>> Zosyn  SIGNIFICANT  EVENTS: 12/2/201: Admit with STEMI>> cardiogenic shock 12/2 Cath/ Impella Placement  12/3 : Temp spike to 104.2>> ? Sepsis, PCT 6, WBC 38, LA 1.4  LINES/TUBES: 12/2: Right Femoral Swan   DISCUSSION: Pt presents with STEMI after what patient thought was viral illness. Cath with severe 3Vd.Impella placed for cardiogenic shock. Developed fever of 104  on 12/3 and WBC spike to 38.8 on 12/4with concern for sepsis after fever spikes. SVR dropped from 11 on 12/3 to 5 on 12/4. No obvious source, but suspect pneumonia. Lactic acid 1.4, PCT: 6. LDH 1161. Cardiac Index 3.86. Currently protecting airway. No pressors or inotropes needed.CCM have been asked to assist with management of suspected sepsis.    ASSESSMENT / PLAN:  PULMONARY A: Acute Respiratory failure >> 6 L [place] Current Smoker 1 ppd PTA Interstitial Lung  Disease per CXR Currently protecting airway CXR suspicious for developing infiltrate RUL  P:   Titrate FIO2 / Oxygen for saturations >  94% CXR 12/5, and prn ABG prn Consider Albuterol prn  Pulmonary toilet as able IS Q 1 while awake Mobilize per cards, limited with LVAD R groin  at present ABX as above  Continuous reassessment of airway protection Smoking cessation at discharge Pulmonary Follow up at discharge PFT at discharge  CARDIOVASCULAR A:  SP STEMI Acute Systolic HF Severe CAD>> TCTS evaluating for CABG once  fever resolved Cardiogenic Shock with Impella Device Assist>> no pressors or  inotropes at present CVP 9 CO 8.57 CI 3.9 P: LVAD per cards Telemetry monitoring  Hemodynamic monitoring per cards Maintain MAP > 65 Levophed as needed BNP per Cards 12 Lead EKG prn Cortisol level pending   RENAL A:  Elevated Creatinine Bloody urine 12/3>> resolved 12/4 with LVAD adjustments  P:   Trend BMET Trend Mag Trend Phos Replete Electrolytes as needed Avoid Nephrotoxic drugs Maintain renal perfusion Strict I&O    GASTROINTESTINAL A:   Pt. With CO  of intermittent abdominal pain P:   SUP: Consider pepcid Q 24 Consider CT abdomen if no other source isolated   HEMATOLOGIC A:   Anemia Anticoagulation Hematuria 12/3>> resolved P:  Trend CBC Transfuse for HGB <7 DVT prophylaxis:Heparin Gtt Monitor for obvious source of bleeding Trend LDH   INFECTIOUS A:  Suspected Sepsis ? Lung source  T Max of 104.2 PCT 6.43 WBC 38.8 Co Ox:  P:   Trend fever/ WBC Curve Trend PCT/Lactate Cultures as above ABX as above Follow micro for results Narrow ABX therapy once sensitivities available Trend LDH  ENDOCRINE A:   DM II   P:   CBG Q 4 SSI  NEUROLOGIC A:   No Acute Issues Sedation not required P:   Neuro Checks per unit routine    FAMILY  - Updates: Wife and son updated at bedside 12/4   - Inter-disciplinary family meet or Palliative Care meeting due by:  12/9/ 2018   Magdalen Spatz, ACAGNP-BC Pulmonary and [place] Pager: [phone]  04/02/2017, 8:55 AM  Attending Note:  71 year old male with recent STEMI who had an impella device.  On day 3 the patient developed fever, leukocytosis and SVR deteriorated on PAC.  PCCM was consulted for sepsis and hypotension.  On exam, extremities are warm with clear lungs.  I reviewed CXR myself, PAC is in good position and mild pulmonary edema noted.  SVR decreased from 1100 to 541.  Will hold further IVF resuscitation given cardiac condition.  Continue Impella.  Will add levophed (avoid neo) given cardiac function if hypotensive.  Check cortisol level.  Stress dose steroids started.  Continue abx.  F/u on cultures.  PCCM will continue to follow.  The patient is critically ill with multiple organ systems failure and requires high complexity decision making for assessment and support, frequent evaluation and titration of therapies, application of advanced monitoring technologies and extensive interpretation of multiple databases.   Critical  Care Time devoted to patient care services described in this note is  45  Minutes. This time reflects time of care of this signee Dr Jennet Maduro. This critical care time does not reflect procedure time, or teaching time or supervisory time of PA/NP/Med student/Med Resident etc but could involve care discussion time.  Rush Farmer, M.D. [facility] Pulmonary/Critical Care Medicine. Pager: [phone]. After hours pager: [phone].

## 2017-04-02 NOTE — Consult Note (Signed)
[address]       [place] [address]             [phone]      Cardiothoracic Surgery Consultation  Reason for Consult: Severe multi-vessel coronary artery disease with severe LV dysfunction Referring Physician: Dr. Mont Dutton is an 71 y.o. male.  HPI:   The patient is a 71 year old gentleman with a family history of premature CAD, active heavy smoking, DM and HTN who reports being in his usual state of health until last Thursday when he started feeling poorly with chills, fever and thought he had the flu. He started having some chest pain around the same time that was intermittent over the weekend and then yesterday presented to the ED with shortness of breath and persistent chest discomfort. He ruled in for STEMI with subtle ST elevation in V1 and V2 and marked ST elevation in V3 with diffuse ST depression. He had Q waves in the anteroseptal leads. His initial troponin poc was 5.67 and then the next troponin was 54. Cardiac cath showed an occluded proximal RCA with faint filling of the PDA and PL by collaterals. The LAD had 95% and 85% proximal stenoses within a heavily calcified and tortuous segment. The LAD is a large vessel that wraps the apex. The proximal to mid LCX has 70% stenosis. The LVEF was 25% with an LVEDP of 37 and PA sat of 52%, CI 1.86. He had an Impella CP inserted for hemodynamic support. Since arriving in the ICU he has had fever to 104, leukocytosis to 38.8, procalcitonin of 6.43, drop in SVR and sepsis is suspected. The only positive culture so far is 20K pseudomonas aeruginosa in his urine.  Past Medical History:  Diagnosis Date  . Bladder cancer ([place])   . Borderline glaucoma   . Diabetes mellitus type 2, controlled ([place]) ORAL MED  . Essential hypertension     Past Surgical History:  Procedure Laterality Date  . CAROTID DUPLEX SCAN  07-26-2010   BILATERAL ICA  STENOSIS 1% - 39%  . CYSTOSCOPY WITH BIOPSY N/A 08/01/2012   Procedure: CYSTOSCOPY WITH BIOPSY BLADDER BIOPSY   ;  Surgeon: Fredricka Bonine, MD;  Location: [facility];  Service: Urology;  Laterality: N/A;  . FULGURATION OF BLADDER TUMOR N/A 08/01/2012   Procedure: FULGURATION OF BLADDER TUMOR;  Surgeon: Fredricka Bonine, MD;  Location: [facility];  Service: Urology;  Laterality: N/A;  . LEFT HEART CATH AND CORONARY ANGIOGRAPHY N/A 03/31/2017   Procedure: LEFT HEART CATH AND CORONARY ANGIOGRAPHY;  Surgeon: Leonie Man, MD;  Location: [place] [facility];  Service: Cardiovascular;  Laterality: N/A;  . RIGHT HEART CATH Right 03/31/2017   Procedure: RIGHT HEART CATH;  Surgeon: Leonie Man, MD;  Location: [place] [facility];  Service: Cardiovascular;  Laterality: Right;  . TRANSURETHRAL RESECTION OF BLADDER TUMOR  05/29/2011   Procedure: TRANSURETHRAL RESECTION OF BLADDER TUMOR (TURBT);  Surgeon: Fredricka Bonine, MD;  Location: [facility];  Service: Urology;  Laterality: N/A;  . VENTRICULAR ASSIST DEVICE INSERTION Right 03/31/2017   Procedure: VENTRICULAR ASSIST DEVICE INSERTION;  Surgeon: Leonie Man, MD;  Location: [place] [facility];  Service: Cardiovascular;  Laterality: Right;    Family History  Problem Relation Age of Onset  . Heart attack Sister   . Heart disease Sister     Social History:  reports that he has  been smoking cigarettes.  He has a 40.00 pack-year smoking history. he has never used smokeless tobacco. He reports that he does not drink alcohol or use drugs.  Allergies: No Known Allergies  Medications:  I have reviewed the patient's current medications. Prior to Admission:  Medications Prior to Admission  Medication Sig Dispense Refill Last Dose  . amLODipine (NORVASC) 10 MG tablet Take 10 mg by mouth daily.   03/30/2017 at Unknown time  . aspirin 81 MG tablet Take 2 tablets (162 mg total) by mouth daily. (Patient taking differently: Take 325 mg by mouth  daily. ) 30 tablet  03/28/2017 at Unknown time  . atorvastatin (LIPITOR) 20 MG tablet Take 20 mg by mouth every morning.    03/30/2017 at Unknown time  . lisinopril (PRINIVIL,ZESTRIL) 20 MG tablet Take 20 mg by mouth every morning.   03/30/2017 at Unknown time  . mometasone (NASONEX) 50 MCG/ACT nasal spray Place 1-2 sprays into the nose daily as needed for allergies.   Past Week at Unknown time  . Phenylephrine-Pheniramine-DM (THERAFLU COLD & COUGH PO) Take by mouth 3 (three) times daily as needed (flu sx).   03/31/2017 at Unknown time  . pioglitazone-metformin (ACTOPLUS MET) 15-500 MG per tablet Take 1 tablet by mouth daily.    03/30/2017 at Unknown time   Scheduled: . aspirin EC  81 mg Oral Daily  . atorvastatin  80 mg Oral QHS  . chlorhexidine  15 mL Mouth Rinse BID  . Chlorhexidine Gluconate Cloth  6 each Topical Q0600  . hydrocortisone sodium succinate  50 mg Intravenous Q6H  . insulin aspart  0-20 Units Subcutaneous TID WC  . mouth rinse  15 mL Mouth Rinse q12n4p  . sodium chloride flush  10-40 mL Intracatheter Q12H  . sodium chloride flush  3 mL Intravenous Q12H   Continuous: . sodium chloride 250 mL (04/02/17 0900)  . sodium chloride 10 mL/hr at 04/02/17 0900  . impella catheter heparin 50 unit/mL in dextrose 5%    . heparin 350 Units/hr (04/02/17 1303)  . norepinephrine (LEVOPHED) Adult infusion    . piperacillin-tazobactam (ZOSYN)  IV 3.375 g (04/02/17 1410)  . vancomycin Stopped (04/02/17 1257)   LZJ:QBHALP chloride, acetaminophen, hydrALAZINE, morphine injection, nitroGLYCERIN, ondansetron (ZOFRAN) IV, sodium chloride flush, sodium chloride flush  Results for orders placed or performed during the hospital encounter of 03/31/17 (from the past 48 hour(s))  CBC with Differential/Platelet     Status: Abnormal   Collection Time: 03/31/17  6:10 PM  Result Value Ref Range   WBC 24.4 (H) 4.0 - 10.5 K/uL   RBC 4.70 4.22 - 5.81 MIL/uL   Hemoglobin 14.2 13.0 - 17.0 g/dL   HCT 42.2  39.0 - 52.0 %   MCV 89.8 78.0 - 100.0 fL   MCH 30.2 26.0 - 34.0 pg   MCHC 33.6 30.0 - 36.0 g/dL   RDW 13.7 11.5 - 15.5 %   Platelets 226 150 - 400 K/uL   Neutrophils Relative % 81 %   Neutro Abs 19.7 (H) 1.7 - 7.7 K/uL   Lymphocytes Relative 7 %   Lymphs Abs 1.7 0.7 - 4.0 K/uL   Monocytes Relative 12 %   Monocytes Absolute 2.9 (H) 0.1 - 1.0 K/uL   Eosinophils Relative 0 %   Eosinophils Absolute 0.0 0.0 - 0.7 K/uL   Basophils Relative 0 %   Basophils Absolute 0.0 0.0 - 0.1 K/uL  Protime-INR     Status: None   Collection Time: 03/31/17  6:10  PM  Result Value Ref Range   Prothrombin Time 14.4 11.4 - 15.2 seconds   INR 1.13   APTT     Status: None   Collection Time: 03/31/17  6:10 PM  Result Value Ref Range   aPTT 31 24 - 36 seconds  Comprehensive metabolic panel     Status: Abnormal   Collection Time: 03/31/17  6:10 PM  Result Value Ref Range   Sodium 131 (L) 135 - 145 mmol/L   Potassium 3.6 3.5 - 5.1 mmol/L   Chloride 101 101 - 111 mmol/L   CO2 21 (L) 22 - 32 mmol/L   Glucose, Bld 184 (H) 65 - 99 mg/dL   BUN 13 6 - 20 mg/dL   Creatinine, Ser 1.24 0.61 - 1.24 mg/dL   Calcium 8.4 (L) 8.9 - 10.3 mg/dL   Total Protein 6.1 (L) 6.5 - 8.1 g/dL   Albumin 3.2 (L) 3.5 - 5.0 g/dL   AST 58 (H) 15 - 41 U/L   ALT 27 17 - 63 U/L   Alkaline Phosphatase 86 38 - 126 U/L   Total Bilirubin 1.3 (H) 0.3 - 1.2 mg/dL   GFR calc non Af Amer 57 (L) >60 mL/min   GFR calc Af Amer >60 >60 mL/min    Comment: (NOTE) The eGFR has been calculated using the CKD EPI equation. This calculation has not been validated in all clinical situations. eGFR's persistently <60 mL/min signify possible Chronic Kidney Disease.    Anion gap 9 5 - 15  Troponin I     Status: Abnormal   Collection Time: 03/31/17  6:10 PM  Result Value Ref Range   Troponin I 4.20 (HH) <0.03 ng/mL    Comment: CRITICAL RESULT CALLED TO, READ BACK BY AND VERIFIED WITH: ZILLICHGRN [id number] MCCAULEG   Lipid panel     Status:  Abnormal   Collection Time: 03/31/17  6:10 PM  Result Value Ref Range   Cholesterol 149 0 - 200 mg/dL   Triglycerides 117 <150 mg/dL   HDL 34 (L) >40 mg/dL   Total CHOL/HDL Ratio 4.4 RATIO   VLDL 23 0 - 40 mg/dL   LDL Cholesterol 92 0 - 99 mg/dL    Comment:        Total Cholesterol/HDL:CHD Risk Coronary Heart Disease Risk Table                     Men   Women  1/2 Average Risk   3.4   3.3  Average Risk       5.0   4.4  2 X Average Risk   9.6   7.1  3 X Average Risk  23.4   11.0        Use the calculated Patient Ratio above and the CHD Risk Table to determine the patient's CHD Risk.        ATP III CLASSIFICATION (LDL):  <100     mg/dL   Optimal  100-129  mg/dL   Near or Above                    Optimal  130-159  mg/dL   Borderline  160-189  mg/dL   High  >190     mg/dL   Very High   I-Stat Troponin, ED (not at [facility])     Status: Abnormal   Collection Time: 03/31/17  6:19 PM  Result Value Ref Range   Troponin i, poc 5.67 (HH) 0.00 - 0.08  ng/mL   Comment NOTIFIED PHYSICIAN    Comment 3            Comment: Due to the release kinetics of cTnI, a negative result within the first hours of the onset of symptoms does not rule out myocardial infarction with certainty. If myocardial infarction is still suspected, repeat the test at appropriate intervals.   POCT Activated clotting time     Status: None   Collection Time: 03/31/17  7:23 PM  Result Value Ref Range   Activated Clotting Time 186 seconds  POCT Activated clotting time     Status: None   Collection Time: 03/31/17  7:39 PM  Result Value Ref Range   Activated Clotting Time 257 seconds  I-STAT 3, arterial blood gas (G3+)     Status: Abnormal   Collection Time: 03/31/17  7:40 PM  Result Value Ref Range   pH, Arterial 7.411 7.350 - 7.450   pCO2 arterial 27.9 (L) 32.0 - 48.0 mmHg   pO2, Arterial 55.0 (L) 83.0 - 108.0 mmHg   Bicarbonate 17.7 (L) 20.0 - 28.0 mmol/L   TCO2 19 (L) 22 - 32 mmol/L   O2 Saturation 89.0 %    Acid-base deficit 5.0 (H) 0.0 - 2.0 mmol/L   Patient temperature HIDE    Sample type ARTERIAL   I-STAT 3, venous blood gas (G3P V)     Status: Abnormal   Collection Time: 03/31/17  7:42 PM  Result Value Ref Range   pH, Ven 7.372 7.250 - 7.430   pCO2, Ven 34.0 (L) 44.0 - 60.0 mmHg   pO2, Ven 28.0 (LL) 32.0 - 45.0 mmHg   Bicarbonate 19.8 (L) 20.0 - 28.0 mmol/L   TCO2 21 (L) 22 - 32 mmol/L   O2 Saturation 52.0 %   Acid-base deficit 5.0 (H) 0.0 - 2.0 mmol/L   Patient temperature HIDE    Sample type VENOUS    Comment NOTIFIED PHYSICIAN   POCT Activated clotting time     Status: None   Collection Time: 03/31/17  8:06 PM  Result Value Ref Range   Activated Clotting Time 241 seconds  I-STAT 3, arterial blood gas (G3+)     Status: Abnormal   Collection Time: 03/31/17  8:14 PM  Result Value Ref Range   pH, Arterial 7.423 7.350 - 7.450   pCO2 arterial 31.0 (L) 32.0 - 48.0 mmHg   pO2, Arterial 86.0 83.0 - 108.0 mmHg   Bicarbonate 20.2 20.0 - 28.0 mmol/L   TCO2 21 (L) 22 - 32 mmol/L   O2 Saturation 97.0 %   Acid-base deficit 3.0 (H) 0.0 - 2.0 mmol/L   Patient temperature HIDE    Sample type ARTERIAL   I-STAT 3, venous blood gas (G3P V)     Status: Abnormal   Collection Time: 03/31/17  8:26 PM  Result Value Ref Range   pH, Ven 7.404 7.250 - 7.430   pCO2, Ven 35.6 (L) 44.0 - 60.0 mmHg   pO2, Ven 28.0 (LL) 32.0 - 45.0 mmHg   Bicarbonate 22.3 20.0 - 28.0 mmol/L   TCO2 23 22 - 32 mmol/L   O2 Saturation 54.0 %   Acid-base deficit 2.0 0.0 - 2.0 mmol/L   Patient temperature HIDE    Sample type VENOUS    Comment NOTIFIED PHYSICIAN   POCT Activated clotting time     Status: None   Collection Time: 03/31/17  9:00 PM  Result Value Ref Range   Activated Clotting Time 202 seconds  MRSA PCR  Screening     Status: None   Collection Time: 03/31/17  9:45 PM  Result Value Ref Range   MRSA by PCR NEGATIVE NEGATIVE    Comment:        The GeneXpert MRSA Assay (FDA approved for NASAL  specimens only), is one component of a comprehensive MRSA colonization surveillance program. It is not intended to diagnose MRSA infection nor to guide or monitor treatment for MRSA infections.   POCT Activated clotting time     Status: None   Collection Time: 03/31/17 11:04 PM  Result Value Ref Range   Activated Clotting Time 142 seconds  Respiratory Panel by PCR     Status: None   Collection Time: 03/31/17 11:30 PM  Result Value Ref Range   Adenovirus NOT DETECTED NOT DETECTED   Coronavirus 229E NOT DETECTED NOT DETECTED   Coronavirus HKU1 NOT DETECTED NOT DETECTED   Coronavirus NL63 NOT DETECTED NOT DETECTED   Coronavirus OC43 NOT DETECTED NOT DETECTED   Metapneumovirus NOT DETECTED NOT DETECTED   Rhinovirus / Enterovirus NOT DETECTED NOT DETECTED   Influenza A NOT DETECTED NOT DETECTED   Influenza B NOT DETECTED NOT DETECTED   Parainfluenza Virus 1 NOT DETECTED NOT DETECTED   Parainfluenza Virus 2 NOT DETECTED NOT DETECTED   Parainfluenza Virus 3 NOT DETECTED NOT DETECTED   Parainfluenza Virus 4 NOT DETECTED NOT DETECTED   Respiratory Syncytial Virus NOT DETECTED NOT DETECTED   Bordetella pertussis NOT DETECTED NOT DETECTED   Chlamydophila pneumoniae NOT DETECTED NOT DETECTED   Mycoplasma pneumoniae NOT DETECTED NOT DETECTED  Brain natriuretic peptide     Status: Abnormal   Collection Time: 03/31/17 11:41 PM  Result Value Ref Range   B Natriuretic Peptide 786.9 (H) 0.0 - 100.0 pg/mL  Troponin I     Status: Abnormal   Collection Time: 03/31/17 11:41 PM  Result Value Ref Range   Troponin I 53.99 (HH) <0.03 ng/mL    Comment: CRITICAL RESULT CALLED TO, READ BACK BY AND VERIFIED WITH: ROBERTS,J RN 04/01/2017 0041 JORDANS   Lactic acid, plasma     Status: None   Collection Time: 03/31/17 11:41 PM  Result Value Ref Range   Lactic Acid, Venous 1.4 0.5 - 1.9 mmol/L  Culture, blood (routine x 2)     Status: None (Preliminary result)   Collection Time: 03/31/17 11:41 PM   Result Value Ref Range   Specimen Description BLOOD LEFT RADIAL A-LINE    Special Requests      BOTTLES DRAWN AEROBIC AND ANAEROBIC Blood Culture adequate volume   Culture NO GROWTH 1 DAY    Report Status PENDING   Hemoglobin A1c     Status: Abnormal   Collection Time: 03/31/17 11:47 PM  Result Value Ref Range   Hgb A1c MFr Bld 6.3 (H) 4.8 - 5.6 %    Comment: (NOTE)         Prediabetes: 5.7 - 6.4         Diabetes: >6.4         Glycemic control for adults with diabetes: <7.0    Mean Plasma Glucose 134 mg/dL    Comment: (NOTE) Performed At: [facility] [address] [place], [place] [id number] Rush Farmer MD [id number]   POCT Activated clotting time     Status: None   Collection Time: 04/01/17 12:23 AM  Result Value Ref Range   Activated Clotting Time 235 seconds  POCT Activated clotting time     Status: None   Collection  Time: 04/01/17  1:15 AM  Result Value Ref Range   Activated Clotting Time 180 seconds  POCT Activated clotting time     Status: None   Collection Time: 04/01/17  2:03 AM  Result Value Ref Range   Activated Clotting Time 175 seconds  Culture, blood (routine x 2)     Status: None (Preliminary result)   Collection Time: 04/01/17  2:10 AM  Result Value Ref Range   Specimen Description BLOOD LEFT HAND    Special Requests      BOTTLES DRAWN AEROBIC ONLY Blood Culture adequate volume   Culture NO GROWTH 1 DAY    Report Status PENDING   Basic metabolic panel     Status: Abnormal   Collection Time: 04/01/17  2:35 AM  Result Value Ref Range   Sodium 132 (L) 135 - 145 mmol/L   Potassium 4.1 3.5 - 5.1 mmol/L   Chloride 103 101 - 111 mmol/L   CO2 20 (L) 22 - 32 mmol/L   Glucose, Bld 156 (H) 65 - 99 mg/dL   BUN 16 6 - 20 mg/dL   Creatinine, Ser 1.27 (H) 0.61 - 1.24 mg/dL   Calcium 8.0 (L) 8.9 - 10.3 mg/dL   GFR calc non Af Amer 55 (L) >60 mL/min   GFR calc Af Amer >60 >60 mL/min    Comment: (NOTE) The eGFR has been calculated using the CKD  EPI equation. This calculation has not been validated in all clinical situations. eGFR's persistently <60 mL/min signify possible Chronic Kidney Disease.    Anion gap 9 5 - 15  Lipid panel     Status: Abnormal   Collection Time: 04/01/17  2:35 AM  Result Value Ref Range   Cholesterol 129 0 - 200 mg/dL   Triglycerides 93 <150 mg/dL   HDL 28 (L) >40 mg/dL   Total CHOL/HDL Ratio 4.6 RATIO   VLDL 19 0 - 40 mg/dL   LDL Cholesterol 82 0 - 99 mg/dL    Comment:        Total Cholesterol/HDL:CHD Risk Coronary Heart Disease Risk Table                     Men   Women  1/2 Average Risk   3.4   3.3  Average Risk       5.0   4.4  2 X Average Risk   9.6   7.1  3 X Average Risk  23.4   11.0        Use the calculated Patient Ratio above and the CHD Risk Table to determine the patient's CHD Risk.        ATP III CLASSIFICATION (LDL):  <100     mg/dL   Optimal  100-129  mg/dL   Near or Above                    Optimal  130-159  mg/dL   Borderline  160-189  mg/dL   High  >190     mg/dL   Very High   CBC WITH DIFFERENTIAL     Status: Abnormal   Collection Time: 04/01/17  2:35 AM  Result Value Ref Range   WBC 22.3 (H) 4.0 - 10.5 K/uL   RBC 4.37 4.22 - 5.81 MIL/uL   Hemoglobin 13.2 13.0 - 17.0 g/dL   HCT 38.5 (L) 39.0 - 52.0 %   MCV 88.1 78.0 - 100.0 fL   MCH 30.2 26.0 - 34.0 pg  MCHC 34.3 30.0 - 36.0 g/dL   RDW 13.7 11.5 - 15.5 %   Platelets 226 150 - 400 K/uL   Neutrophils Relative % 81 %   Lymphocytes Relative 5 %   Monocytes Relative 14 %   Eosinophils Relative 0 %   Basophils Relative 0 %   Neutro Abs 18.1 (H) 1.7 - 7.7 K/uL   Lymphs Abs 1.1 0.7 - 4.0 K/uL   Monocytes Absolute 3.1 (H) 0.1 - 1.0 K/uL   Eosinophils Absolute 0.0 0.0 - 0.7 K/uL   Basophils Absolute 0.0 0.0 - 0.1 K/uL   WBC Morphology MILD LEFT SHIFT (1-5% METAS, OCC MYELO, OCC BANDS)   Magnesium     Status: None   Collection Time: 04/01/17  2:35 AM  Result Value Ref Range   Magnesium 1.8 1.7 - 2.4 mg/dL   Lactic acid, plasma     Status: None   Collection Time: 04/01/17  2:35 AM  Result Value Ref Range   Lactic Acid, Venous 1.6 0.5 - 1.9 mmol/L  I-STAT 4, (NA,K, GLUC, HGB,HCT)     Status: Abnormal   Collection Time: 04/01/17  2:35 AM  Result Value Ref Range   Sodium 134 (L) 135 - 145 mmol/L   Potassium 4.1 3.5 - 5.1 mmol/L   Glucose, Bld 157 (H) 65 - 99 mg/dL   HCT 38.0 (L) 39.0 - 52.0 %   Hemoglobin 12.9 (L) 13.0 - 17.0 g/dL  POCT Activated clotting time     Status: None   Collection Time: 04/01/17  3:12 AM  Result Value Ref Range   Activated Clotting Time 164 seconds  POCT Activated clotting time     Status: None   Collection Time: 04/01/17  4:07 AM  Result Value Ref Range   Activated Clotting Time 169 seconds  Blood gas, arterial     Status: Abnormal   Collection Time: 04/01/17  4:15 AM  Result Value Ref Range   O2 Content 6.0 L/min   Delivery systems NASAL CANNULA    pH, Arterial 7.472 (H) 7.350 - 7.450   pCO2 arterial 29.6 (L) 32.0 - 48.0 mmHg   pO2, Arterial 70.6 (L) 83.0 - 108.0 mmHg   Bicarbonate 21.1 20.0 - 28.0 mmol/L   Acid-base deficit 1.8 0.0 - 2.0 mmol/L   O2 Saturation 94.0 %   Patient temperature 100.4    Collection site A-LINE    Drawn by [phone]    Sample type ARTERIAL DRAW   POCT Activated clotting time     Status: None   Collection Time: 04/01/17  5:01 AM  Result Value Ref Range   Activated Clotting Time 164 seconds  POCT Activated clotting time     Status: None   Collection Time: 04/01/17  6:10 AM  Result Value Ref Range   Activated Clotting Time 169 seconds  .Cooxemetry Panel (carboxy, met, total hgb, O2 sat)     Status: None   Collection Time: 04/01/17  6:23 AM  Result Value Ref Range   Total hemoglobin TEST WILL BE CREDITED 12.0 - 16.0 g/dL    Comment: CORRECTED ON 12/03 AT 4174: PREVIOUSLY REPORTED AS 13.4   O2 Saturation TEST WILL BE CREDITED %    Comment: CORRECTED ON 12/03 AT 0814: PREVIOUSLY REPORTED AS 94.1   Carboxyhemoglobin TEST WILL BE  CREDITED 0.5 - 1.5 %    Comment: CORRECTED ON 12/03 AT 4818: PREVIOUSLY REPORTED AS 1.0   Methemoglobin TEST WILL BE CREDITED 0.0 - 1.5 %    Comment: CORRECTED  ON 12/03 AT 0742: PREVIOUSLY REPORTED AS 1.4  .Cooxemetry Panel (carboxy, met, total hgb, O2 sat)     Status: Abnormal   Collection Time: 04/01/17  6:40 AM  Result Value Ref Range   Total hemoglobin 10.8 (L) 12.0 - 16.0 g/dL   O2 Saturation 72.3 %   Carboxyhemoglobin 1.1 0.5 - 1.5 %   Methemoglobin 1.5 0.0 - 1.5 %  POCT Activated clotting time     Status: None   Collection Time: 04/01/17  8:05 AM  Result Value Ref Range   Activated Clotting Time 169 seconds  Glucose, capillary     Status: Abnormal   Collection Time: 04/01/17  8:06 AM  Result Value Ref Range   Glucose-Capillary 145 (H) 65 - 99 mg/dL   Comment 1 Arterial Specimen   POCT Activated clotting time     Status: None   Collection Time: 04/01/17  9:12 AM  Result Value Ref Range   Activated Clotting Time 219 seconds  Urinalysis, Routine w reflex microscopic     Status: Abnormal   Collection Time: 04/01/17  9:27 AM  Result Value Ref Range   Color, Urine RED (A) YELLOW    Comment: BIOCHEMICALS MAY BE AFFECTED BY COLOR   APPearance CLOUDY (A) CLEAR   Specific Gravity, Urine  1.005 - 1.030    TEST NOT REPORTED DUE TO COLOR INTERFERENCE OF URINE PIGMENT   pH  5.0 - 8.0    TEST NOT REPORTED DUE TO COLOR INTERFERENCE OF URINE PIGMENT   Glucose, UA (A) NEGATIVE mg/dL    TEST NOT REPORTED DUE TO COLOR INTERFERENCE OF URINE PIGMENT   Hgb urine dipstick (A) NEGATIVE    TEST NOT REPORTED DUE TO COLOR INTERFERENCE OF URINE PIGMENT   Bilirubin Urine (A) NEGATIVE    TEST NOT REPORTED DUE TO COLOR INTERFERENCE OF URINE PIGMENT   Ketones, ur (A) NEGATIVE mg/dL    TEST NOT REPORTED DUE TO COLOR INTERFERENCE OF URINE PIGMENT   Protein, ur (A) NEGATIVE mg/dL    TEST NOT REPORTED DUE TO COLOR INTERFERENCE OF URINE PIGMENT   Nitrite (A) NEGATIVE    TEST NOT REPORTED DUE TO COLOR  INTERFERENCE OF URINE PIGMENT   Leukocytes, UA (A) NEGATIVE    TEST NOT REPORTED DUE TO COLOR INTERFERENCE OF URINE PIGMENT  Culture, Urine     Status: Abnormal (Preliminary result)   Collection Time: 04/01/17  9:27 AM  Result Value Ref Range   Specimen Description URINE, RANDOM    Special Requests NONE    Culture 20,000 COLONIES/mL PSEUDOMONAS AERUGINOSA (A)    Report Status PENDING   Urinalysis, Microscopic (reflex)     Status: Abnormal   Collection Time: 04/01/17  9:27 AM  Result Value Ref Range   RBC / HPF 6-30 0 - 5 RBC/hpf   WBC, UA TOO NUMEROUS TO COUNT 0 - 5 WBC/hpf   Bacteria, UA FEW (A) NONE SEEN   Squamous Epithelial / LPF 0-5 (A) NONE SEEN   Mucus PRESENT    Hyaline Casts, UA PRESENT    Amorphous Crystal PRESENT   POCT Activated clotting time     Status: None   Collection Time: 04/01/17 10:30 AM  Result Value Ref Range   Activated Clotting Time 169 seconds  Glucose, capillary     Status: Abnormal   Collection Time: 04/01/17 11:36 AM  Result Value Ref Range   Glucose-Capillary 186 (H) 65 - 99 mg/dL  POCT Activated clotting time     Status: None  Collection Time: 04/01/17 11:38 AM  Result Value Ref Range   Activated Clotting Time 169 seconds  Lactate dehydrogenase     Status: Abnormal   Collection Time: 04/01/17 12:06 PM  Result Value Ref Range   LDH 1,276 (H) 98 - 192 U/L  Procalcitonin - Baseline     Status: None   Collection Time: 04/01/17 12:06 PM  Result Value Ref Range   Procalcitonin 0.42 ng/mL    Comment:        Interpretation: PCT (Procalcitonin) <= 0.5 ng/mL: Systemic infection (sepsis) is not likely. Local bacterial infection is possible. (NOTE)       Sepsis PCT Algorithm           Lower Respiratory Tract                                      Infection PCT Algorithm    ----------------------------     ----------------------------         PCT < 0.25 ng/mL                PCT < 0.10 ng/mL         Strongly encourage             Strongly discourage    discontinuation of antibiotics    initiation of antibiotics    ----------------------------     -----------------------------       PCT 0.25 - 0.50 ng/mL            PCT 0.10 - 0.25 ng/mL               OR       >80% decrease in PCT            Discourage initiation of                                            antibiotics      Encourage discontinuation           of antibiotics    ----------------------------     -----------------------------         PCT >= 0.50 ng/mL              PCT 0.26 - 0.50 ng/mL               AND        <80% decrease in PCT             Encourage initiation of                                             antibiotics       Encourage continuation           of antibiotics    ----------------------------     -----------------------------        PCT >= 0.50 ng/mL                  PCT > 0.50 ng/mL               AND         increase in PCT  Strongly encourage                                      initiation of antibiotics    Strongly encourage escalation           of antibiotics                                     -----------------------------                                           PCT <= 0.25 ng/mL                                                 OR                                        > 80% decrease in PCT                                     Discontinue / Do not initiate                                             antibiotics   POCT Activated clotting time     Status: None   Collection Time: 04/01/17 12:39 PM  Result Value Ref Range   Activated Clotting Time 175 seconds  Culture, expectorated sputum-assessment     Status: None   Collection Time: 04/01/17  1:15 PM  Result Value Ref Range   Specimen Description EXPECTORATED SPUTUM    Special Requests NONE    Sputum evaluation      Sputum specimen not acceptable for testing.  Please recollect.   Gram Stain Report Called to,Read Back By and Verified With: T NICHOLS,RN AT [id number] 04/01/17 BY L  BENFIELD    Report Status 04/01/2017 FINAL   Glucose, capillary     Status: Abnormal   Collection Time: 04/01/17  4:17 PM  Result Value Ref Range   Glucose-Capillary 146 (H) 65 - 99 mg/dL   Comment 1 Arterial Specimen   POCT Activated clotting time     Status: None   Collection Time: 04/01/17  4:17 PM  Result Value Ref Range   Activated Clotting Time 175 seconds  Calcium, ionized     Status: Abnormal   Collection Time: 04/01/17  4:18 PM  Result Value Ref Range   Calcium, Ionized, Serum 4.4 (L) 4.5 - 5.6 mg/dL    Comment: (NOTE) Performed At: [facility] [place], [place] [id number] Rush Farmer MD YY:[id number]   Lactate dehydrogenase     Status: Abnormal   Collection Time: 04/01/17  5:15 PM  Result Value Ref Range   LDH 1,293 (H) 98 - 192 U/L  POCT Activated clotting time     Status:  None   Collection Time: 04/01/17  8:04 PM  Result Value Ref Range   Activated Clotting Time 186 seconds  Glucose, capillary     Status: Abnormal   Collection Time: 04/01/17  9:24 PM  Result Value Ref Range   Glucose-Capillary 181 (H) 65 - 99 mg/dL   Comment 1 Arterial Specimen   POCT Activated clotting time     Status: None   Collection Time: 04/01/17  9:29 PM  Result Value Ref Range   Activated Clotting Time 180 seconds  POCT Activated clotting time     Status: None   Collection Time: 04/01/17 10:30 PM  Result Value Ref Range   Activated Clotting Time 191 seconds  POCT Activated clotting time     Status: None   Collection Time: 04/02/17 12:14 AM  Result Value Ref Range   Activated Clotting Time 180 seconds  POCT Activated clotting time     Status: None   Collection Time: 04/02/17  2:05 AM  Result Value Ref Range   Activated Clotting Time 191 seconds  Culture, expectorated sputum-assessment     Status: None (Preliminary result)   Collection Time: 04/02/17  2:38 AM  Result Value Ref Range   Specimen Description EXPECTORATED SPUTUM    Special Requests NONE     Sputum evaluation      Sputum specimen not acceptable for testing.  Please recollect.   RESULT CALLED TO, READ BACK BY AND VERIFIED WITH: ARMARI FUSSELL RN [id number] 04/02/17 A BROWNING    Report Status PENDING   Basic metabolic panel     Status: Abnormal   Collection Time: 04/02/17  3:21 AM  Result Value Ref Range   Sodium 128 (L) 135 - 145 mmol/L   Potassium 4.1 3.5 - 5.1 mmol/L   Chloride 99 (L) 101 - 111 mmol/L   CO2 16 (L) 22 - 32 mmol/L   Glucose, Bld 133 (H) 65 - 99 mg/dL   BUN 21 (H) 6 - 20 mg/dL   Creatinine, Ser 1.27 (H) 0.61 - 1.24 mg/dL   Calcium 7.7 (L) 8.9 - 10.3 mg/dL   GFR calc non Af Amer 55 (L) >60 mL/min   GFR calc Af Amer >60 >60 mL/min    Comment: (NOTE) The eGFR has been calculated using the CKD EPI equation. This calculation has not been validated in all clinical situations. eGFR's persistently <60 mL/min signify possible Chronic Kidney Disease.    Anion gap 13 5 - 15  CBC WITH DIFFERENTIAL     Status: Abnormal   Collection Time: 04/02/17  3:21 AM  Result Value Ref Range   WBC 38.8 (H) 4.0 - 10.5 K/uL   RBC 4.23 4.22 - 5.81 MIL/uL   Hemoglobin 12.6 (L) 13.0 - 17.0 g/dL   HCT 36.8 (L) 39.0 - 52.0 %   MCV 87.0 78.0 - 100.0 fL   MCH 29.8 26.0 - 34.0 pg   MCHC 34.2 30.0 - 36.0 g/dL   RDW 13.8 11.5 - 15.5 %   Platelets 200 150 - 400 K/uL   Neutrophils Relative % 85 %   Lymphocytes Relative 4 %   Monocytes Relative 11 %   Eosinophils Relative 0 %   Basophils Relative 0 %   Neutro Abs 32.9 (H) 1.7 - 7.7 K/uL   Lymphs Abs 1.6 0.7 - 4.0 K/uL   Monocytes Absolute 4.3 (H) 0.1 - 1.0 K/uL   Eosinophils Absolute 0.0 0.0 - 0.7 K/uL   Basophils Absolute 0.0 0.0 - 0.1 K/uL  WBC Morphology MILD LEFT SHIFT (1-5% METAS, OCC MYELO, OCC BANDS)   Magnesium     Status: None   Collection Time: 04/02/17  3:21 AM  Result Value Ref Range   Magnesium 2.0 1.7 - 2.4 mg/dL  Lactate dehydrogenase     Status: Abnormal   Collection Time: 04/02/17  3:21 AM  Result Value Ref Range    LDH 1,161 (H) 98 - 192 U/L  Procalcitonin     Status: None   Collection Time: 04/02/17  3:21 AM  Result Value Ref Range   Procalcitonin 6.43 ng/mL    Comment:        Interpretation: PCT > 2 ng/mL: Systemic infection (sepsis) is likely, unless other causes are known. (NOTE)       Sepsis PCT Algorithm           Lower Respiratory Tract                                      Infection PCT Algorithm    ----------------------------     ----------------------------         PCT < 0.25 ng/mL                PCT < 0.10 ng/mL         Strongly encourage             Strongly discourage   discontinuation of antibiotics    initiation of antibiotics    ----------------------------     -----------------------------       PCT 0.25 - 0.50 ng/mL            PCT 0.10 - 0.25 ng/mL               OR       >80% decrease in PCT            Discourage initiation of                                            antibiotics      Encourage discontinuation           of antibiotics    ----------------------------     -----------------------------         PCT >= 0.50 ng/mL              PCT 0.26 - 0.50 ng/mL               AND       <80% decrease in PCT              Encourage initiation of                                             antibiotics       Encourage continuation           of antibiotics    ----------------------------     -----------------------------        PCT >= 0.50 ng/mL                  PCT > 0.50 ng/mL  AND         increase in PCT                  Strongly encourage                                      initiation of antibiotics    Strongly encourage escalation           of antibiotics                                     -----------------------------                                           PCT <= 0.25 ng/mL                                                 OR                                        > 80% decrease in PCT                                     Discontinue / Do not initiate                                              antibiotics   POCT Activated clotting time     Status: None   Collection Time: 04/02/17  3:42 AM  Result Value Ref Range   Activated Clotting Time 169 seconds  .Cooxemetry Panel (carboxy, met, total hgb, O2 sat)     Status: None   Collection Time: 04/02/17  3:46 AM  Result Value Ref Range   Total hemoglobin 12.9 12.0 - 16.0 g/dL   O2 Saturation 93.0 %   Carboxyhemoglobin 1.4 0.5 - 1.5 %   Methemoglobin 1.0 0.0 - 1.5 %  Blood gas, arterial     Status: Abnormal   Collection Time: 04/02/17  5:00 AM  Result Value Ref Range   O2 Content 6.0 L/min   pH, Arterial 7.446 7.350 - 7.450   pCO2 arterial 26.9 (L) 32.0 - 48.0 mmHg   pO2, Arterial 64.4 (L) 83.0 - 108.0 mmHg   Bicarbonate 18.3 (L) 20.0 - 28.0 mmol/L   Acid-base deficit 5.1 (H) 0.0 - 2.0 mmol/L   O2 Saturation 93.0 %   Patient temperature 98.6    Collection site A-LINE    Drawn by DRAWN BY RN    Sample type ARTERIAL DRAW   POCT Activated clotting time     Status: None   Collection Time: 04/02/17  6:15 AM  Result Value Ref Range   Activated Clotting Time 169 seconds  Glucose, capillary     Status: Abnormal   Collection Time: 04/02/17  7:43 AM  Result Value Ref Range   Glucose-Capillary 168 (H) 65 - 99 mg/dL   Comment 1 Arterial Specimen   Lactic acid, plasma     Status: None   Collection Time: 04/02/17  8:27 AM  Result Value Ref Range   Lactic Acid, Venous 1.4 0.5 - 1.9 mmol/L  Glucose, capillary     Status: Abnormal   Collection Time: 04/02/17 11:22 AM  Result Value Ref Range   Glucose-Capillary 129 (H) 65 - 99 mg/dL   Comment 1 Arterial Specimen   POCT Activated clotting time     Status: None   Collection Time: 04/02/17 11:23 AM  Result Value Ref Range   Activated Clotting Time 169 seconds    Dg Chest Port 1 View  Result Date: 04/02/2017 CLINICAL DATA:  Respiratory difficulty, fever EXAM: PORTABLE CHEST 1 VIEW COMPARISON:  04/01/2017 FINDINGS: Swan-Ganz catheter tip  remains in the right lower lobe pulmonary artery. Mild cardiomegaly and vascular congestion. Diffuse interstitial prominence throughout the lungs, left greater than right again noted. This is increased in the right upper lobe since prior study. This could reflect asymmetric edema or infection. IMPRESSION: Bilateral interstitial disease, left greater than right, worsening in the right upper lobe. This could reflect worsening edema or infection. Electronically Signed   By: Rolm Baptise M.D.   On: 04/02/2017 08:10   Dg Chest Port 1 View  Result Date: 04/01/2017 CLINICAL DATA:  Fever to 102 degrees, bladder cancer, type II diabetes mellitus, hypertension EXAM: PORTABLE CHEST 1 VIEW COMPARISON:  Portable exam 1302 hours compared to 03/31/2017 FINDINGS: Impella device in LEFT ventricle and aorta. Enlargement of cardiac silhouette with pulmonary vascular congestion. Asymmetric pulmonary interstitial infiltrates LEFT greater than RIGHT favoring asymmetric edema, unchanged. Interval removal of Swan-Ganz catheter. Atherosclerotic calcification aorta. No pleural effusion or pneumothorax. IMPRESSION: Persistent probable asymmetric pulmonary edema. Electronically Signed   By: Lavonia Dana M.D.   On: 04/01/2017 13:21   Dg Chest Port 1 View  Result Date: 03/31/2017 CLINICAL DATA:  72 year old male with central line placement. EXAM: PORTABLE CHEST 1 VIEW COMPARISON:  Chest radiograph dated 03/31/2017 FINDINGS: An inferior approach Swan-Ganz with tip in the region of the right pulmonary artery. An Impella device is seen with the pigtail tip over the left ventricle. There is atherosclerotic calcification of the thoracic aorta. There is diffuse interstitial prominence primarily involving the left lung with Kerley B-lines consistent with interstitial edema. There is no focal consolidation, pleural effusion, or pneumothorax. No acute osseous pathology. IMPRESSION: 1. Interval placement of an inferior approach Swan-Ganz catheter  and an Impella device. 2. Increased interstitial prominence consistent with edema. Electronically Signed   By: Anner Crete M.D.   On: 03/31/2017 22:41   Dg Chest Portable 1 View  Result Date: 03/31/2017 CLINICAL DATA:  Chest pain. EXAM: PORTABLE CHEST 1 VIEW COMPARISON:  None. FINDINGS: Cardiomediastinal silhouette is normal. Mediastinal contours appear intact. Heavy calcific atherosclerotic disease of the aorta. There is no evidence of focal airspace consolidation, pleural effusion or pneumothorax. Osseous structures are without acute abnormality. Soft tissues are grossly normal. IMPRESSION: Calcific atherosclerotic disease of the aorta. No evidence of pulmonary edema or airspace consolidation. Electronically Signed   By: Fidela Salisbury M.D.   On: 03/31/2017 18:57    Review of Systems  Constitutional: Positive for chills, diaphoresis, fever and malaise/fatigue.  HENT: Negative.   Eyes: Negative.   Respiratory: Positive for cough, sputum production and shortness of breath. Negative for hemoptysis.   Cardiovascular: Positive for chest pain and orthopnea.  Gastrointestinal: Positive for abdominal pain. Negative for constipation, diarrhea, nausea and vomiting.  Genitourinary: Negative.   Musculoskeletal: Negative.   Skin: Negative.   Neurological: Negative.   Endo/Heme/Allergies: Negative.   Psychiatric/Behavioral: Negative.    Blood pressure 124/70, pulse 97, temperature 100.2 F (37.9 C), resp. rate 20, height _0  (1.88 m), weight 97.9 kg (215 lb 13.3 oz), SpO2 94 %. Physical Exam  Constitutional: He is oriented to person, place, and time. He appears well-developed and well-nourished. No distress.  HENT:  Head: Normocephalic and atraumatic.  Mouth/Throat: Oropharynx is clear and moist.  Eyes: Conjunctivae and EOM are normal. Pupils are equal, round, and reactive to light.  Neck: Normal range of motion. Neck supple. No JVD present. No thyromegaly present.  Cardiovascular:  Normal rate, regular rhythm, normal heart sounds and intact distal pulses.  No murmur heard. Respiratory: Effort normal. No respiratory distress. He has no wheezes. He has rales.  GI: Soft. Bowel sounds are normal. He exhibits no distension and no mass. There is no tenderness.  Musculoskeletal: Normal range of motion. He exhibits no edema or tenderness.  Lymphadenopathy:    He has no cervical adenopathy.  Neurological: He is alert and oriented to person, place, and time.  Skin: Skin is warm and dry.  Psychiatric: He has a normal mood and affect.   Physicians   Panel Physicians Referring Physician Case Authorizing Physician  Leonie Man, MD (Primary)    Procedures   LEFT HEART CATH AND CORONARY ANGIOGRAPHY  RIGHT HEART CATH  VENTRICULAR ASSIST DEVICE INSERTION  Conclusion     Prox RCA lesion is 100% stenosed. Dist RCA lesion is 100% stenosed (retrograde filling from collaterals does not go beyond the distal vessel) -->faint collateral filling to the distal PL and PDA from septal perforators and a diffusely diseased AV groove circumflex  LAV Groove lesion is 90% stenosed.  Prox LAD-1 lesion is 95% stenosed. Prox LAD-2 lesion is 85% stenosed. Heavily calcified, tandem lesions in very tortuous segment of the vessel.  Prox Cx to Mid Cx lesion is 70% stenosed. Heavily calcified  There is severe left ventricular systolic dysfunction. The left ventricular ejection fraction is less than 25% by visual estimate.  LV end diastolic pressure is severely elevated -prior to Impella insertion  Hemodynamic findings consistent with moderate pulmonary hypertension.  Successful Impella insertion with 3.5-3.6 LPM flow's   The patient has severe native coronary disease with an occluded RCA and extensive calcified disease in the LAD including 2 severe lesions that are not favorable for PCI based on the tortuosity, heavily calcified segment before and after the lesions.  Likely not to get a good  outcome without atherectomy which is dangerous given the tortuosity. There is also diffuse calcified disease in the circumflex that appears to be at least 70%.  The patient has severe ischemic cardiomyopathy and presented in acute hypoxic respiratory failure with acute systolic and diastolic heart failure now stabilized on nasal cannula oxygen with Impella in place.  Relatively hemodynamically stable.  Bedside limited stat echocardiogram was performed in the Cath Lab to ensure adequate Impella placement.  Assistance with Impella device representative was appreciated.  Plan: After discussion with Dr. Haroldine Laws and reviewing the images, we both agreed that attempted PCI on a Sunday evening in this critically ill patient is potentially dangerous.  Admit to ICU/CCU with standard post Impella care and monitoring.  Diuresis Via Lasix drip.  Okay to insert Foley catheter  Cycle cardiac enzymes.  Plan will be to "tune  up" with diuresis and Impella and contact CT surgery in the morning to consider CABG.  We will hold amlodipine and ACE inhibitor, but continue low-dose beta-blocker given his sinus tachycardia until his blood pressure stabilized.    Glenetta Hew, M.D., M.S. Interventional Cardiologist   Pager # [phone] Phone # [phone] [address] [place], [place] [address]    Indications   Acute ST elevation myocardial infarction (STEMI) of anterior wall (HCC) [I21.09 (ICD-10-CM)]  Acute respiratory failure with hypoxemia (HCC) [J96.01 (ICD-10-CM)]  Acute combined systolic and diastolic heart failure (HCC) [I50.41 (ICD-10-CM)]  Cardiogenic shock (HCC) [R57.0 (ICD-10-CM)]  Procedural Details/Technique   Technical Details PCP: No primary care provider on file. CARDIOLOGIST: New  SAMYAK SACKMANN is a 71 y.o. old male with medical history noted below presents to the ED with acute complaints of dyspnea. The patient states he has been feeling poorly since  Friday however though this was secondary to the flu. He has been having some chest pain some of which moves into this shoulders. He again attributed this to his flu symptoms, however, today he has had progressive dyspnea and was unable to walk across the room of his house. He has no cardiac history. Currently without chest pain, however, he continues to have dyspnea but is better that earlier. ECG with ST segment elevation in V1-V4 and reciprical changes in the inferior and lateral leads. These findings are concerning for subacute anterior MI with Q waves, however he is in sinus tachycardia with acute hypoxic respiratory failure likely related to CHF. He was borderline hemodynamically stable from a blood pressure standpoint but tachycardic in the 110s. I felt it prudent to take him emergently to the catheterization lab for angiography and possible PCI, with the thought that he may require hemodynamic support.  Time Out: Verified patient identification, verified procedure, site/side was marked, verified correct patient position, special equipment/implants available, medications/allergies/relevent history reviewed, required imaging and test results available. Performed. Consent Signed.   Access:  * RIGHT Radial Artery: 6 Fr sheath -- Seldinger technique using Angiocath Micropuncture Kit * 10 mL radial cocktail IA; 5000 Units IV Heparin * RIGHT Common Femoral Artery: 5 Fr Sheath - fluoroscopically guided modified Seldinger Technique - for Impella (see Impella Segment) * RIGHT Common Femoral Vein: 7 Fr Sheath - Seldinger Technique - for Swan (See RHC section)  Left Heart Catheterization: 5 Fr Catheters advanced or exchanged over a J-wire under direct fluoroscopic guidance into the ascending aorta; TIG 4.0 catheter advanced first.  * Left Coronary Artery Cineangiography: TIG 4.0 Catheter  * Right Coronary Artery Cineangiography: TIG 4.0 Catheter  * LV Hemodynamics (LV Gram): TIG 4.0 Catheter  Initially  angiography revealed severe multivessel disease. His EDP was 58mHg. The LAD lesions did not appear to be favorable for a PTCA approach and therefore I decided to stabilize the patient with an Impala support device given his elevated LVEDP, tachycardia, and borderline blood pressures. He was given IV Lasix, and right femoral access was used for Impella insertion and Right Heart Catheterization.  - After completion of angiography, the catheter was removed completely out of the body over wire without complication.  Radial sheath will be removed in the CCU with TR Band placed for hemostasis.   MEDICATIONS * SQ Lidocaine 338m* Radial Cocktail: 3 mg Verapamil in 10 mL NS * Isovue Contrast: 185 mL where I mean I done * Heparin: 5000 units * IV furosemide 40 mg x 1   Estimated blood loss <50 mL.  During this procedure the patient was administered the following to achieve and maintain moderate conscious sedation: Versed 1 mg, Fentanyl 75 mcg, while the patient's heart rate, blood pressure, and oxygen saturation were continuously monitored. The period of conscious sedation was 102 minutes, of which I was present face-to-face 100% of this time.  Complications   Complications documented before study signed (03/31/2017 10:13 PM EST)    No complications were associated with this study.  Documented by Leonie Man, MD - 03/31/2017 9:49 PM EST    Coronary Findings   Diagnostic  Dominance: Right  Left Main  Vessel is large.  Left Anterior Descending  There is mild diffuse disease throughout the vessel. The vessel is severely calcified. The vessel is severely tortuous. From proximal to distal. Most notable in the area of the significant lesions.  Prox LAD-1 lesion 95% stenosed  Prox LAD-1 lesion is 95% stenosed.  Prox LAD-2 lesion 85% stenosed  Prox LAD-2 lesion is 85% stenosed. The lesion is located proximal to the major branch, irregular, thrombotic and ulcerative.  First Diagonal Branch   Vessel is small in size.  Second Diagonal Branch  Vessel is moderate in size.  Left Circumflex  Vessel is large.  Prox Cx to Mid Cx lesion 70% stenosed  Prox Cx to Mid Cx lesion is 70% stenosed. The lesion is eccentric. The lesion is calcified.  First Obtuse Marginal Branch  Vessel is small in size.  Second Obtuse Marginal Branch  Third Obtuse Marginal Branch  Vessel is large in size.  Left Atrioventricular Groove Continuation  Vessel is small in size.  LAV Groove lesion 90% stenosed  LAV Groove lesion is 90% stenosed. The lesion is segmental and eccentric.  Right Coronary Artery  Vessel is moderate in size.  Prox RCA lesion 100% stenosed  Prox RCA lesion is 100% stenosed. The lesion is located proximal to the major branch. Potentially CTO. Cannot be sure  Dist RCA lesion 100% stenosed  Dist RCA lesion is 100% stenosed.  Right Posterior Descending Artery  Vessel is moderate in size. There is moderate disease in the vessel.  Collaterals  RPDA filled by collaterals from 1st Sept.    Right Posterior Atrioventricular Branch  There is moderate disease in the vessel.  Collaterals  Post Atrio filled by collaterals from LAV Groove.    Intervention   No interventions have been documented.  Right Heart   Right Heart Pressures Hemodynamic findings consistent with moderate pulmonary hypertension. PAP/mean: 45/27/37 mmHg PCWP: 32-35 mmHg Elevated LV EDP consistent with volume overload. LVP/EDP: 117/21/37 mmHg AO sat 89%. PA sat 52%. Cardiac output/index (Fick): 4.09, 1.86.  Right Atrium Right atrial pressure is elevated. RAP mean: Not recorded. Was measured at 14-16 mmHg  Access:  * RIGHT Common Femoral Vein: 7 Fr Sheath - Seldinger Technique.  Right Heart Catheterization: 7 Fr Gordy Councilman catheter advanced under fluoroscopy with balloon inflated to the RA, RV, then PCWP-PA for hemodynamic measurement.  * Simultaneous FA & PA blood gases checked for SaO2% to calculate FICK CO/CI  *  Catheter cover advanced and secured on sheath. -Inserted to 75 cm  Right Ventricle RVP/EDP: 44/14/15 mmHg  Impella/IABP   Hemodynamic Support An Impella CP was inserted for hemodynamic support in the setting of cardiogenic shock. Access site: right femoral artery.  Access:  * Common Femoral Artery: 5 Fr Sheath - fluoroscopically guided modified Seldinger Technique   -Brief femoral angiogram was performed showing adequate insertion site, then the sheath was exchanged with sequential dilation  to 33 [place] long Impella sheath. -After ACT was ensured to be greater than 250 Sec, Impella was inserted under fluoroscopic guidance into the left ventricle over the standard Impella wire. -Position was evaluated both fluoroscopically and using echocardiography -The insertion sheath was then removed with peel-away technique and the holding sheath inserted. The catheter cover was then locked in place on the [place]. -Flow level on P-8 = 3.5-3.6  Wall Motion      The basilar anterior and basilar inferior segments are normal. The mid anterior segment is hypokinetic. The mid inferior, apical anterior and apical inferior segments are akinetic.           Left Heart   Left Ventricle The left ventricle is mildly dilated. There is severe left ventricular systolic dysfunction. LV end diastolic pressure is severely elevated. Calculated EF is 25%. The left ventricular ejection fraction is less than 25% by visual estimate. There are LV function abnormalities due to segmental dysfunction and global hypokinesis. There is mild to moderate mitral regurgitation. Estimated EF 20-25%  Coronary Diagrams   Diagnostic Diagram              *[facility]*                         [address], [place] [address]                            [phone]  ------------------------------------------------------------------- Transthoracic  Echocardiography  (Report amended )  Patient:    Tracey, Stewart MR #:       [id number] Study Date: 04/01/2017 Gender:     M Age:        32 Height:     188 cm Weight:     95.8 kg BSA:        2.25 m^2 Pt. Status: Room:       2H15C   ADMITTING    Glenetta Hew, MD  ATTENDING    Glenetta Hew, MD  SONOGRAPHER  Johny Chess, RDCS, CCT  PERFORMING   [facility], Inpatient  ORDERING     Shirley Friar  cc:  ------------------------------------------------------------------- LV EF: 15% -   20%  ------------------------------------------------------------------- Indications:      CAD of native vessels 414.01.  ------------------------------------------------------------------- History:   Risk factors:  Hypertension. Diabetes mellitus.  ------------------------------------------------------------------- Study Conclusions  - Left ventricle: The cavity size was normal. Systolic function was   severely reduced. The estimated ejection fraction was in the   range of 15% to 20%. Diffuse hypokinesis worse in the   inferolateral, mid-apical inferior, mid-apical inferoseptal,   anteroseptal, and apical myocardium. Doppler parameters are   consistent with abnormal left ventricular relaxation (grade 1   diastolic dysfunction). Doppler parameters are consistent with   indeterminate ventricular filling pressure. - Aortic valve: Transvalvular velocity was within the normal range.   There was no stenosis. There was moderate regurgitation. - Mitral valve: Transvalvular velocity was within the normal range.   There was no evidence for stenosis. There was no regurgitation. - Right ventricle: The cavity size was normal. Wall thickness was   normal. Systolic function was normal. - Right atrium: The  atrium was normal in size. - Atrial septum: No defect or patent foramen ovale was identified   by color flow Doppler. - Tricuspid valve: There was no  regurgitation.  ------------------------------------------------------------------- Study data:  Comparison was made to the study of 03/31/2017.  Study status:  Routine.  Procedure:  Transthoracic echocardiography. Image quality was adequate.  Study completion:  There were no complications.          Transthoracic echocardiography.  M-mode, complete 2D, spectral Doppler, and color Doppler.  Birthdate: Patient birthdate: 01-01-1946.  Age:  Patient is 71 yr old.  Sex: Gender: male.    BMI: 27.1 kg/m^2.  Patient status:  Inpatient. Study date:  Study date: 04/01/2017. Study time: 10:20 AM. Location:  ICU/CCU  -------------------------------------------------------------------  ------------------------------------------------------------------- Left ventricle:  The cavity size was normal. Systolic function was severely reduced. The estimated ejection fraction was in the range of 15% to 20%. Diffuse hypokinesis worse in the inferolateral, mid-apical inferior, mid-apical inferoseptal, anteroseptal, and apical myocardium. Doppler parameters are consistent with abnormal left ventricular relaxation (grade 1 diastolic dysfunction). Doppler parameters are consistent with indeterminate ventricular filling pressure.  ------------------------------------------------------------------- Aortic valve:  Impella device in place across the aortic valve. Trileaflet; normal thickness, mildly calcified leaflets. Mobility was not restricted.  Doppler:  Transvalvular velocity was within the normal range. There was no stenosis. There was moderate regurgitation.  ------------------------------------------------------------------- Aorta:  Aortic root: The aortic root was normal in size.  ------------------------------------------------------------------- Mitral valve:   Structurally normal valve.   Mobility was not restricted.  Doppler:  Transvalvular velocity was within the normal range. There was no  evidence for stenosis. There was no regurgitation.  ------------------------------------------------------------------- Left atrium:  The atrium was normal in size.  ------------------------------------------------------------------- Atrial septum:  No defect or patent foramen ovale was identified by color flow Doppler.  ------------------------------------------------------------------- Right ventricle:  The cavity size was normal. Wall thickness was normal. Systolic function was normal.  ------------------------------------------------------------------- Pulmonic valve:    Structurally normal valve.   Cusp separation was normal.  Doppler:  Transvalvular velocity was within the normal range. There was no evidence for stenosis. There was no regurgitation.  ------------------------------------------------------------------- Tricuspid valve:   Structurally normal valve.    Doppler: Transvalvular velocity was within the normal range. There was no regurgitation.  ------------------------------------------------------------------- Pulmonary artery:   The main pulmonary artery was normal-sized. Systolic pressure could not be accurately estimated.  ------------------------------------------------------------------- Right atrium:  The atrium was normal in size.  ------------------------------------------------------------------- Pericardium:  There was no pericardial effusion.  ------------------------------------------------------------------- Systemic veins: Inferior vena cava: The vessel was normal in size. The respirophasic diameter changes were in the normal range (= 50%), consistent with normal central venous pressure.  ------------------------------------------------------------------- Measurements   Left ventricle                      Value        Reference  LV ID, ED, PLAX chordal             45    mm     43 - 52  LV ID, ES, PLAX chordal             38    mm      23 - 38  LV fx shortening, PLAX chordal (L)  16    %      >=29  Stroke volume, 2D                   38    ml     ----------  Stroke volume/bsa, 2D               17    ml/m^2 ----------  LV ejection fraction, 1-p A4C       23    %      ----------  LV e&', lateral                      5.77  cm/s   ----------  LV E/e&', lateral                    11.77        ----------  LV e&', medial                       6.2   cm/s   ----------  LV E/e&', medial                     10.95        ----------  LV e&', average                      5.99  cm/s   ----------  LV E/e&', average                    11.35        ----------    LVOT                                Value        Reference  LVOT ID, S                          20    mm     ----------  LVOT area                           3.14  cm^2   ----------  LVOT peak velocity, S               53.2  cm/s   ----------  LVOT mean velocity, S               35.7  cm/s   ----------  LVOT VTI, S                         12    cm     ----------  LVOT peak gradient, S               1     mm Hg  ----------    Aorta                               Value        Reference  Aortic root ID, ED                  36    mm     ----------    Left atrium                         Value        Reference  LA volume, S  36.1  ml     ----------  LA volume/bsa, S                    16    ml/m^2 ----------  LA volume, ES, 1-p A4C              42.7  ml     ----------  LA volume/bsa, ES, 1-p A4C          19    ml/m^2 ----------  LA volume, ES, 1-p A2C              27.1  ml     ----------  LA volume/bsa, ES, 1-p A2C          12    ml/m^2 ----------    Mitral valve                        Value        Reference  Mitral E-wave peak velocity         67.9  cm/s   ----------  Mitral A-wave peak velocity         102   cm/s   ----------  Mitral deceleration time            215   ms     150 - 230  Mitral E/A ratio, peak              0.7          ----------     Right atrium                        Value        Reference  RA ID, S-I, ES, A4C                 41.2  mm     34 - 49  RA area, ES, A4C                    12.4  cm^2   8.3 - 19.5  RA volume, ES, A/L                  31.1  ml     ----------  RA volume/bsa, ES, A/L              13.8  ml/m^2 ----------    Systemic veins                      Value        Reference  Estimated CVP                       3     mm Hg  ----------    Right ventricle                     Value        Reference  TAPSE                               22    mm     ----------  RV s&', lateral, S                   15.9  cm/s   ----------  Legend: (L)  and  (H)  mark values outside specified reference range.  ------------------------------------------------------------------- Neena Rhymes, MD 2018-12-03T13:20:54 Assessment/Plan:  This 71 year old gentleman has severe mult-vessel CAD and severe LV systolic dysfunction with cardiogenic and probably septic shock presenting with acute/subacute STEMI. The time course sounds like he had some infection that started and then has an MI related to the stress of his coronary ischemia and MI. He has an Impella inserted for hemodynamic support with low CI and high LVEDP. I agree that his CAD is most amenable to CABG. He had had persistent fever as high as 104 yesterday with rising leukocytosis to 38K with low SVR and I don't think he is an operative candidate until this sepsis issue is resolved. It is not clear where this is coming from and his only positive culture so far is 20K pseudomonas in his urine. He could have fever and leukocytosis related to an MI but I would not expect it to this degree. CCM has been consulted. He had an echo yesterday that showed moderate AI probably due to the Impella but no evidence of vegetation. I would treat his sepsis and when he improves we could perform CABG but I would not do it unless his fever resolved and leukocytosis improves. I  discussed the results of his cath and echo with him and his wife and my recommendation. All of their questions have been answered.   I spent 60 minutes performing this consultation and > 50% of this time was spent face to face counseling and coordinating the care of this patient's severe multivessel CAD and cardiogenic/septic shock.  [name] 04/02/2017

## 2017-04-03 ENCOUNTER — Encounter (HOSPITAL_COMMUNITY): Payer: Medicare Other

## 2017-04-03 ENCOUNTER — Inpatient Hospital Stay (HOSPITAL_COMMUNITY): Payer: Medicare Other

## 2017-04-03 LAB — BLOOD GAS, ARTERIAL
Acid-base deficit: 3.3 mmol/L — ABNORMAL HIGH (ref 0.0–2.0)
Bicarbonate: 20.1 mmol/L (ref 20.0–28.0)
O2 CONTENT: 6 L/min
O2 SAT: 98 %
PATIENT TEMPERATURE: 98.6
pCO2 arterial: 29.7 mmHg — ABNORMAL LOW (ref 32.0–48.0)
pH, Arterial: 7.445 (ref 7.350–7.450)
pO2, Arterial: 108 mmHg (ref 83.0–108.0)

## 2017-04-03 LAB — CBC WITH DIFFERENTIAL/PLATELET
Band Neutrophils: 0 %
Basophils Absolute: 0 10*3/uL (ref 0.0–0.1)
Basophils Relative: 0 %
Blasts: 0 %
Eosinophils Absolute: 0 10*3/uL (ref 0.0–0.7)
Eosinophils Relative: 0 %
HCT: 31.1 % — ABNORMAL LOW (ref 39.0–52.0)
Hemoglobin: 10.4 g/dL — ABNORMAL LOW (ref 13.0–17.0)
LYMPHS ABS: 2.8 10*3/uL (ref 0.7–4.0)
LYMPHS PCT: 8 %
MCH: 29.3 pg (ref 26.0–34.0)
MCHC: 33.4 g/dL (ref 30.0–36.0)
MCV: 87.6 fL (ref 78.0–100.0)
MONO ABS: 2.1 10*3/uL — AB (ref 0.1–1.0)
Metamyelocytes Relative: 0 %
Monocytes Relative: 6 %
Myelocytes: 0 %
NEUTROS PCT: 86 %
NRBC: 0 /100{WBCs}
Neutro Abs: 30.4 10*3/uL — ABNORMAL HIGH (ref 1.7–7.7)
OTHER: 0 %
PLATELETS: 175 10*3/uL (ref 150–400)
PROMYELOCYTES ABS: 0 %
RBC: 3.55 MIL/uL — ABNORMAL LOW (ref 4.22–5.81)
RDW: 14.3 % (ref 11.5–15.5)
WBC: 35.3 10*3/uL — ABNORMAL HIGH (ref 4.0–10.5)

## 2017-04-03 LAB — GLUCOSE, CAPILLARY
GLUCOSE-CAPILLARY: 194 mg/dL — AB (ref 65–99)
GLUCOSE-CAPILLARY: 103 mg/dL — AB (ref 65–99)
Glucose-Capillary: 148 mg/dL — ABNORMAL HIGH (ref 65–99)
Glucose-Capillary: 145 mg/dL — ABNORMAL HIGH (ref 65–99)
Glucose-Capillary: 145 mg/dL — ABNORMAL HIGH (ref 65–99)

## 2017-04-03 LAB — BASIC METABOLIC PANEL
Anion gap: 11 (ref 5–15)
BUN: 22 mg/dL — ABNORMAL HIGH (ref 6–20)
CHLORIDE: 99 mmol/L — AB (ref 101–111)
CO2: 19 mmol/L — ABNORMAL LOW (ref 22–32)
Calcium: 7.5 mg/dL — ABNORMAL LOW (ref 8.9–10.3)
Creatinine, Ser: 1.05 mg/dL (ref 0.61–1.24)
GFR calc non Af Amer: >60 mL/min (ref >60)
Glucose, Bld: 123 mg/dL — ABNORMAL HIGH (ref 65–99)
POTASSIUM: 3.5 mmol/L (ref 3.5–5.1)
SODIUM: 129 mmol/L — AB (ref 135–145)

## 2017-04-03 LAB — LACTATE DEHYDROGENASE: LDH: 904 U/L — ABNORMAL HIGH (ref 98–192)

## 2017-04-03 LAB — POCT ACTIVATED CLOTTING TIME
ACTIVATED CLOTTING TIME: 164 s
ACTIVATED CLOTTING TIME: 164 s
ACTIVATED CLOTTING TIME: 169 s
ACTIVATED CLOTTING TIME: 180 s
Activated Clotting Time: 164 seconds
Activated Clotting Time: 169 seconds
Activated Clotting Time: 175 seconds

## 2017-04-03 LAB — COOXEMETRY PANEL
CARBOXYHEMOGLOBIN: 1.4 % (ref 0.5–1.5)
Carboxyhemoglobin: 0.7 % (ref 0.5–1.5)
METHEMOGLOBIN: 1.5 % (ref 0.0–1.5)
Methemoglobin: 1.3 % (ref 0.0–1.5)
O2 Saturation: 64.4 %
O2 Saturation: 64.2 %
TOTAL HEMOGLOBIN: 12.2 g/dL (ref 12.0–16.0)
Total hemoglobin: 10 g/dL — ABNORMAL LOW (ref 12.0–16.0)

## 2017-04-03 LAB — URINE CULTURE: Culture: 20000 — AB

## 2017-04-03 LAB — HEMOGLOBIN FREE, PLASMA
Hgb, Plasma: 140.4 mg/dL — ABNORMAL HIGH (ref 0.0–4.9)
Hgb, Plasma: 14.4 mg/dL — ABNORMAL HIGH (ref 0.0–4.9)

## 2017-04-03 LAB — EXPECTORATED SPUTUM ASSESSMENT W REFEX TO RESP CULTURE

## 2017-04-03 LAB — MAGNESIUM: MAGNESIUM: 2.2 mg/dL (ref 1.7–2.4)

## 2017-04-03 LAB — PROCALCITONIN: PROCALCITONIN: 5.15 ng/mL

## 2017-04-03 LAB — CALCIUM, IONIZED: CALCIUM, IONIZED, SERUM: 4.4 mg/dL — AB (ref 4.5–5.6)

## 2017-04-03 LAB — EXPECTORATED SPUTUM ASSESSMENT W GRAM STAIN, RFLX TO RESP C

## 2017-04-03 LAB — VANCOMYCIN, TROUGH: Vancomycin Tr: 16 ug/mL (ref 15–20)

## 2017-04-03 MED ORDER — OXYCODONE HCL 5 MG PO TABS
5.0000 mg | ORAL_TABLET | ORAL | Status: DC | PRN
  Administered 2017-04-03 – 2017-04-06 (×7): 5 mg via ORAL
  Filled 2017-04-03 (×7): qty 1

## 2017-04-03 MED ORDER — SPIRONOLACTONE 12.5 MG HALF TABLET
12.5000 mg | ORAL_TABLET | Freq: Every day | ORAL | Status: DC
  Administered 2017-04-03: 12.5 mg via ORAL
  Filled 2017-04-03 (×2): qty 1

## 2017-04-03 MED ORDER — ORAL CARE MOUTH RINSE
15.0000 mL | Freq: Two times a day (BID) | OROMUCOSAL | Status: DC
  Administered 2017-04-03 – 2017-04-07 (×7): 15 mL via OROMUCOSAL

## 2017-04-03 MED ORDER — DIGOXIN 125 MCG PO TABS
0.1250 mg | ORAL_TABLET | Freq: Every day | ORAL | Status: DC
  Administered 2017-04-03 – 2017-04-07 (×5): 0.125 mg via ORAL
  Filled 2017-04-03 (×5): qty 1

## 2017-04-03 MED ORDER — FUROSEMIDE 10 MG/ML IJ SOLN
40.0000 mg | Freq: Once | INTRAMUSCULAR | Status: AC
  Administered 2017-04-03: 40 mg via INTRAVENOUS
  Filled 2017-04-03: qty 4

## 2017-04-03 MED ORDER — POTASSIUM CHLORIDE CRYS ER 20 MEQ PO TBCR
20.0000 meq | EXTENDED_RELEASE_TABLET | Freq: Once | ORAL | Status: AC
Start: 2017-04-03 — End: 2017-04-03
  Administered 2017-04-03: 20 meq via ORAL
  Filled 2017-04-03: qty 1

## 2017-04-03 MED ORDER — POTASSIUM CHLORIDE CRYS ER 20 MEQ PO TBCR
40.0000 meq | EXTENDED_RELEASE_TABLET | Freq: Once | ORAL | Status: AC
  Administered 2017-04-03: 40 meq via ORAL
  Filled 2017-04-03: qty 2

## 2017-04-03 MED ORDER — ENALAPRIL MALEATE 2.5 MG PO TABS
2.5000 mg | ORAL_TABLET | Freq: Two times a day (BID) | ORAL | Status: DC
  Administered 2017-04-03 – 2017-04-05 (×6): 2.5 mg via ORAL
  Filled 2017-04-03 (×7): qty 1

## 2017-04-03 NOTE — Significant Event (Signed)
Foley catheter removed this afternoon after the effects of lasix. Placed pouch to collect urine for when patient voids, to protect lines on groin against urine contamination.   Rasul Decola

## 2017-04-03 NOTE — Progress Notes (Signed)
Advanced Heart Failure Rounding Note  Subjective:    Admitted 03/31/17 with CP -> STEMI -> Cath with severe 3Vd. 04/01/17 developed fever up to 104. Cultures drawn  Impella turned down to P4 04/01/17 evening with elevated LDH. LDH trending down since. PCCM consulted 04/02/17   Resp panel 04/01/17 negative UCx 04/01/17 with Psuedomonas aeruginosa BCx 04/01/17 NGTD Sputum Cx 04/02/17 with "too many epithelial cells to run" per RN  Feeling somewhat better this am. Still coughing and using pulmonary toilet for production. Fever improved. Malaise slowly improving. Not on any pressor support. Denies CP.   Impella p4, Flow 2.5, no alarms.  LDH 1293 -> 1176 -> 904  CVP 9-10 cm  WBC 35.3. Tmax 101.8. PCT 5.15. Lactic Acid 1.4 on 04/02/17.  CXR this am with official read pending. Personally reviewed. Edema with ? URL infiltrate.   Swan numbers CVP 10 CO 8.57 CI 3.86 PAP 24/17 (20) SVR 541  Objective:   Weight Range: 211 lb 13.8 oz (96.1 kg) Body mass index is 27.2 kg/m.   Vital Signs:   Temp:  [99.5 F (37.5 C)-101.8 F (38.8 C)] 99.5 F (37.5 C) (12/05 0700) Pulse Rate:  [90-110] 106 (12/05 0700) Resp:  [13-25] 18 (12/05 0700) BP: (73-137)/(42-95) 115/72 (12/05 0700) SpO2:  [93 %-100 %] 93 % (12/05 0700) Arterial Line BP: (110-178)/(53-80) 158/69 (12/05 0700) Weight:  [211 lb 13.8 oz (96.1 kg)] 211 lb 13.8 oz (96.1 kg) (12/05 0400) Last BM Date: 03/01/17 Weight change: Filed Weights   03/31/17 2145 04/02/17 0400 04/03/17 0400  Weight: 211 lb 3.2 oz (95.8 kg) 215 lb 13.3 oz (97.9 kg) 211 lb 13.8 oz (96.1 kg)   Intake/Output:   Intake/Output Summary (Last 24 hours) at 04/03/2017 0736 Last data filed at 04/03/2017 0700 Gross per 24 hour  Intake 2083.8 ml  Output 1145 ml  Net 938.8 ml    Physical Exam    General: Critically ill appearing.  HEENT: Normal Neck: Supple. JVP 9-10 cm. Carotids 2+ bilat; no bruits. No thyromegaly or nodule noted. Cor: PMI nondisplaced.  RRR, No M/G/R noted Lungs: Diminished throughout. + crackles Abdomen: Soft, non-tender, non-distended, no HSM. No bruits or masses. +BS  Extremities: No cyanosis, clubbing, or rash. R groin site stable with Impella and swan. R DP 1= Neuro: Alert & orientedx3, cranial nerves grossly intact. moves all 4 extremities w/o difficulty. Affect flat but appropriate.   Telemetry   NSR -> ST 90-100s, Personally reviewed.   EKG   No new tracing.   Labs    CBC Recent Labs    04/02/17 0321 04/03/17 0358  WBC 38.8* 35.3*  NEUTROABS 32.9* 30.4*  HGB 12.6* 10.4*  HCT 36.8* 31.1*  MCV 87.0 87.6  PLT 200 546   Basic Metabolic Panel Recent Labs    04/02/17 0321 04/03/17 0358  NA 128* 129*  K 4.1 3.5  CL 99* 99*  CO2 16* 19*  GLUCOSE 133* 123*  BUN 21* 22*  CREATININE 1.27* 1.05  CALCIUM 7.7* 7.5*  MG 2.0 2.2   Liver Function Tests Recent Labs    03/31/17 1810  AST 58*  ALT 27  ALKPHOS 86  BILITOT 1.3*  PROT 6.1*  ALBUMIN 3.2*   No results for input(s): LIPASE, AMYLASE in the last 72 hours. Cardiac Enzymes Recent Labs    03/31/17 1810 03/31/17 2341  TROPONINI 4.20* 53.99*    BNP: BNP (last 3 results) Recent Labs    03/31/17 2341  BNP 786.9*  ProBNP (last 3 results) No results for input(s): PROBNP in the last 8760 hours.   D-Dimer No results for input(s): DDIMER in the last 72 hours. Hemoglobin A1C Recent Labs    03/31/17 2347  HGBA1C 6.3*   Fasting Lipid Panel Recent Labs    04/01/17 0235  CHOL 129  HDL 28*  LDLCALC 82  TRIG 93  CHOLHDL 4.6   Thyroid Function Tests No results for input(s): TSH, T4TOTAL, T3FREE, THYROIDAB in the last 72 hours.  Invalid input(s): FREET3  Other results:   Imaging    Dg Chest Port 1 View  Result Date: 04/02/2017 CLINICAL DATA:  Respiratory difficulty, fever EXAM: PORTABLE CHEST 1 VIEW COMPARISON:  04/01/2017 FINDINGS: Swan-Ganz catheter tip remains in the right lower lobe pulmonary artery. Mild  cardiomegaly and vascular congestion. Diffuse interstitial prominence throughout the lungs, left greater than right again noted. This is increased in the right upper lobe since prior study. This could reflect asymmetric edema or infection. IMPRESSION: Bilateral interstitial disease, left greater than right, worsening in the right upper lobe. This could reflect worsening edema or infection. Electronically Signed   By: Rolm Baptise M.D.   On: 04/02/2017 08:10     Medications:     Scheduled Medications: . aspirin EC  81 mg Oral Daily  . atorvastatin  80 mg Oral QHS  . chlorhexidine  15 mL Mouth Rinse BID  . Chlorhexidine Gluconate Cloth  6 each Topical q morning - 10a  . hydrocortisone sodium succinate  50 mg Intravenous Q6H  . insulin aspart  0-20 Units Subcutaneous TID WC  . mouth rinse  15 mL Mouth Rinse q12n4p  . sodium chloride flush  10-40 mL Intracatheter Q12H  . sodium chloride flush  3 mL Intravenous Q12H    Infusions: . sodium chloride 250 mL (04/03/17 0700)  . sodium chloride 10 mL/hr at 04/03/17 0700  . impella catheter heparin 50 unit/mL in dextrose 5%    . heparin 350 Units/hr (04/03/17 0700)  . norepinephrine (LEVOPHED) Adult infusion Stopped (04/02/17 0845)  . piperacillin-tazobactam (ZOSYN)  IV 3.375 g (04/03/17 0600)  . vancomycin Stopped (04/03/17 0130)    PRN Medications: sodium chloride, acetaminophen, hydrALAZINE, morphine injection, nitroGLYCERIN, ondansetron (ZOFRAN) IV, sodium chloride flush, sodium chloride flush   Patient Profile   71 y/o with anterior STEMI who developed cardiogenic shock. 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 Impella placed for stabilization of shock.   Assessment/Plan   1. Cardiogenic shock - Impella placed overnight 12/2 into 12/3. Output improved.  - Impella P4, Flow 2.5. Waveform OK.  - LDH 1293 -> 1161 -> 904 - BP labile.    2. ID -> Sepsis unclear source.  - Tmax  101.8. Remains on Vanc/Zosyn. Afebrile this am.  - CCM consulted.  - levophed as needed - UCx with pseudomonas.  - BCx NGTD.  - Sputum culture sent but "unfit for testing" with too many epithelial cells per lab.  - Bicarb normalized this am after gentle IVF yesterday.   3. Acute systolic CHF in setting of STEMI: Echo with EF 15-20%.  - Ischemic CMP. Will need CABG once recovered from sepsis.  - Volume status trending up. Give IV lasix 40 mg this am and follow.  - No BB for now.  - Start digoxin 0.125 mg daily - Start spiro 12.5 mg daily.  - BP very labile. Restart enalapril 2.5 mg BID  4. Severe CAD - Dr  Bartle has seen. Plan CABG pending resolution of fever and leukocytosis.  - No CP at this time.  - Hold BB with shock.  5. DM2 - Continue SSI. No change.   6. Hypomagnesemia - Stable today.   7. Tobacco abuse - Smoked 1 ppd PTA.  - Will continue to encourage complete cessation. No change.   8. HTN - Meds as above. Will treat in the setting of managing his HF. No change.   9. Hx of bladder CA - Urologist has told him he may have intermittent hematuria, but UOP amber on arrival. So ? Hemolysis.  - Pt states he had multiple rounds of Bacillus Calmette-Guerin (BCG) therapy. No change.   10. Hypokalemia - K 3.5 this am. Supp ordered - Consider spiro now that pressures trending up.   Patient is critically ill and remains in imminent danger of multiple organ failure.    Medication concerns reviewed with patient and pharmacy team. Barriers identified: None at this time.   Length of Stay: 3  Annamaria Helling  04/03/2017, 7:36 AM  Advanced Heart Failure Team Pager [phone] (M-F; 7a - 4p)  Please contact [facility] for night-coverage after hours (4p -7a ) and weekends on [email or web address]  Agree.  Remains on Impella at P-4. Flows and waveforms look good. Output good on swan. Fever curve improving now off cooling blanket. Volume status elevated.   LDH  dropping slowly. Creatinine stable. R DP pulse is palpable but not as strong as previous. Foot is warm. PCT up.   On exam More alert. Mildly dyspneic Cor RRR + impella hum Lungs +crackles Ab soft NT Ext  R femoral Impella and swan R DP 1+  Neuro intact  He is improving slowly but still very tenuous. Infectious issues seem to be resolving. Continue broad spectrum abx. Follow culture data. Will wean Impella to P-3.Diurese today.  I discussed case with Dr. Cyndia Bent at bedside. If infectious issues continue to improve consider CABG Friday afternoon. Otherwise early next week. If have to wait until next week may consider removing Impella prior to surgery. Watch R foot closely.   CRITICAL CARE Performed by: Glori Bickers  Total critical care time: 35 minutes  Critical care time was exclusive of separately billable procedures and treating other patients.  Critical care was necessary to treat or prevent imminent or life-threatening deterioration.  Critical care was time spent personally by me (independent of midlevel providers or residents) on the following activities: development of treatment plan with patient and/or surrogate as well as nursing, discussions with consultants, evaluation of patient's response to treatment, examination of patient, obtaining history from patient or surrogate, ordering and performing treatments and interventions, ordering and review of laboratory studies, ordering and review of radiographic studies, pulse oximetry and re-evaluation of patient's condition.  Glori Bickers, MD  9:46 AM

## 2017-04-03 NOTE — Progress Notes (Signed)
Pharmacy Antibiotic Note  [name] [name] is a 71 y.o. male admitted on 03/31/2017 with fever, flu-like symptoms.  Pharmacy has been consulted for vancomycin and zosyn dosing.  Respiratory virus panel negative.  Likely RUL PNA.  Fever lower today, Tm 100.2.  WBC still elevated but trending down.  Vancomycin trough level today = 16, at goal of 15-20.  20K/ml pseudomonas in urine.  Pan-sensitive, should be adequately covered by Zosyn.  Plan: 1. Continue vancomycin 1250 mg IV q 12 hrs.  Can likely d/c soon if cultures remain negative. 2. Zosyn 3.375g IV q 8 hrs (extended-interval infusion). 3. F/u renal function, cultures and clinical course.  Height: 6\' 2"  (188 cm) Weight: 211 lb 13.8 oz (96.1 kg) IBW/kg (Calculated) : 82.2  Temp (24hrs), Avg:100.3 F (37.9 C), Min:99.5 F (37.5 C), Max:101.8 F (38.8 C)  Recent Labs  Lab 03/31/17 1810 03/31/17 2341 04/01/17 0235 04/02/17 0321 04/02/17 0827 04/03/17 0358 04/03/17 1157  WBC 24.4*  --  22.3* 38.8*  --  35.3*  --   CREATININE 1.24  --  1.27* 1.27*  --  1.05  --   LATICACIDVEN  --  1.4 1.6  --  1.4  --   --   VANCOTROUGH  --   --   --   --   --   --  16    Estimated Creatinine Clearance: 75 mL/min (by C-G formula based on SCr of 1.05 mg/dL).    No Known Allergies  Antimicrobials this admission:  12/3 Vanc > 12/3 Zosyn >   Dose adjustments this admission:  12/5 VT = 16 on 1250 q 12 hrs - con't same  Microbiology results:  12/4 Resp Cx: 12/3 BCx x 2:  12/3 UCx: 20,000/ml pseudomonas - pan-S 12/2 MRSA PCR:  12/2 Resp virus panel - neg Thank you for allowing pharmacy to be a part of this patient's care.  [name], BCPS  Clinical Pharmacist Pager [phone]  04/03/2017 1:13 PM

## 2017-04-03 NOTE — Consult Note (Addendum)
[facility] / CRITICAL CARE MEDICINE   Name: [name] [name] MRN: [id number] DOB: [birth year]    ADMISSION DATE:  03/31/2017 CONSULTATION DATE:  04/02/2017  REFERRING MD: Oda Kilts, PA  CHIEF COMPLAINT: Sepsis  HISTORY OF PRESENT ILLNESS:   [name] [name] is a 71 y.o. male with PMH of HTN, DM, and previous hx of bladder cancer.  Pt presented to [facility] 03/31/17 pm with acute dyspnea. Had felt bad since 03/29/17. Pt initially though he had the flu, but also began to have CP and progressive dyspnea. ECG with STEMI in V1-V4 with reciprocal changes inferiorly and laterally. Troponin Peak 53.99, BNP 787. Taken emergently to cath lab.  Cath showed severe native CAD with occluded RCA and extensive calcified disease in the LAD including 2 severe lesions that are not favorable for PCI. Diffuse CAD also noted in Cx that appears 70%. Impella device placed for support and TCTS consulted for CABG consideration.  P4, Flow 2.4  Breathing initially  improved. States he had only felt bad for the past ~ week. Prior to that was at his USOH, and had no DOE walking on flat ground, or doing any ADLs. No personal cardiac history prior to admit. States his sister had an MI in her 21s. Current 1 ppd smoker with 40 year pack history. Pt was having  pink/reddish blood tinged sputum. He started spiking temps 04/01/2017 with T max of 104.2, SVR fell from 1100 to 541 overnight. He was initially diuresed for cardiogenic shock 12/3. There is concern for septic shock 12/4, and patient is receiving 500 cc  fluid bolus, BP is soft. PCCM was asked to see patient for assistance in management of suspected septic shock, ? Pulmonary source.  SUBJECTIVE:  impella remains Fever 101.3 overnight Not on pressors  VITAL SIGNS: BP 130/77   Pulse 96   Temp 99.9 F (37.7 C) (Core (Comment))   Resp 18   Ht 6\' 2"  (1.88 m)   Wt 96.1 kg (211 lb 13.8 oz)   SpO2 90%   BMI 27.20 kg/m   HEMODYNAMICS: PAP: (26-44)/(14-31) 34/21 CVP:  [4  mmHg-14 mmHg] 7 mmHg PCWP:  [16 mmHg-21 mmHg] 16 mmHg CO:  [7.7 L/min-8.7 L/min] 8.7 L/min CI:  [3.5 L/min/m2-3.9 L/min/m2] 3.9 L/min/m2   INTAKE / OUTPUT: I/O last 3 completed shifts: In: 3250.3 [P.O.:460; I.V.:1226.1; Other:564.2; IV Piggyback:1000] Out: 1510 [Urine:1510]  PHYSICAL EXAMINATION: General: awake, alert Neuro: nonfocal exam, perrl HEENT: jvd  PULM: ronchi CV:  s1 s2 rrt impella GI: soft, BS  No r/g, not tender Extremities: no rash, mild edema   LABS:  BMET Recent Labs  Lab 04/01/17 0235 04/02/17 0321 04/03/17 0358  NA 132*  134* 128* 129*  K 4.1  4.1 4.1 3.5  CL 103 99* 99*  CO2 20* 16* 19*  BUN 16 21* 22*  CREATININE 1.27* 1.27* 1.05  GLUCOSE 156*  157* 133* 123*    Electrolytes Recent Labs  Lab 04/01/17 0235 04/02/17 0321 04/03/17 0358  CALCIUM 8.0* 7.7* 7.5*  MG 1.8 2.0 2.2    CBC Recent Labs  Lab 04/01/17 0235 04/02/17 0321 04/03/17 0358  WBC 22.3* 38.8* 35.3*  HGB 13.2  12.9* 12.6* 10.4*  HCT 38.5*  38.0* 36.8* 31.1*  PLT 226 200 175    Coag's Recent Labs  Lab 03/31/17 1810  APTT 31  INR 1.13    Sepsis Markers Recent Labs  Lab 03/31/17 2341 04/01/17 0235 04/01/17 1206 04/02/17 0321 04/02/17 0827 04/03/17 0358  LATICACIDVEN 1.4 1.6  --   --  1.4  --   PROCALCITON  --   --  0.42 6.43  --  5.15    ABG Recent Labs  Lab 04/01/17 0415 04/02/17 0500 04/03/17 0410  PHART 7.472* 7.446 7.445  PCO2ART 29.6* 26.9* 29.7*  PO2ART 70.6* 64.4* 108    Liver Enzymes Recent Labs  Lab 03/31/17 1810  AST 58*  ALT 27  ALKPHOS 86  BILITOT 1.3*  ALBUMIN 3.2*    Cardiac Enzymes Recent Labs  Lab 03/31/17 1810 03/31/17 2341  TROPONINI 4.20* 53.99*    Glucose Recent Labs  Lab 04/01/17 1617 04/01/17 2124 04/02/17 0743 04/02/17 1122 04/02/17 2114 04/03/17 0806  GLUCAP 146* 181* 168* 129* 124* 194*    Imaging Dg Chest Port 1 View  Result Date: 04/03/2017 CLINICAL DATA:  Respiratory failure EXAM:  PORTABLE CHEST 1 VIEW COMPARISON:  04/02/2017 FINDINGS: Cardiac shadow is mildly enlarged but stable. And Impella catheter is again identified and stable. Swan-Ganz catheter from a femoral approach is also noted. Diffuse interstitial changes are again identified throughout the left lung and the right upper lobe. These are stable in appearance from the prior study. No new focal abnormality is seen. No bony abnormality is noted. IMPRESSION: Stable interstitial changes bilaterally. Tubes and lines as described stable in appearance. Electronically Signed   By: Inez Catalina M.D.   On: 04/03/2017 09:13     STUDIES:  04/02/2017 CXR:  Bilateral interstitial disease, left greater than right, worsening in the right upper lobe. This could reflect worsening edema or Infection.  12/3 Echo: EF 15-20%, with diffuse hypokinesis, Grade I diastolic Dysfunction, Aortic Valve moderate regurg   [facility] 03/31/17  Prox RCA lesion is 100% stenosed. Dist RCA lesion is 100% stenosed (retrograde filling from collaterals does not go beyond the distal vessel) -->faint collateral filling to the distal PL and PDA from septal perforators and a diffusely diseased AV groove circumflex  LAV Groove lesion is 90% stenosed.  Prox LAD-1 lesion is 95% stenosed. Prox LAD-2 lesion is 85% stenosed. Heavily calcified, tandem lesions in very tortuous segment of the vessel.  Prox Cx to Mid Cx lesion is 70% stenosed. Heavily calcified  There is severe left ventricular systolic dysfunction. The left ventricular ejection fraction is less than 25% by visual estimate.   RHC Procedural Findings: PAP/mean: 45/27/37 mmHg PCWP: 32-35 mmHg Elevated LV EDP consistent with volume overload.  LVP/EDP: 117/21/37 mmHg  AO sat 89% PA sat 52%. Cardiac output/index (Fick): 4.09, 1.86.   CULTURES: 12/2>> Viral Panel>> Negative 12/3>> Blood>> 12/3>> Urine>>pseudomonas 12/4>> Respiratory Culture>>   ANTIBIOTICS: 04/01/2017>> Vanc 04/01/2017>>  Zosyn  SIGNIFICANT EVENTS: 12/2/201: Admit with STEMI>> cardiogenic shock 12/2 Cath/ Impella Placement  12/3 : Temp spike to 104.2>> ? Sepsis, PCT 6, WBC 38, LA 1.4  LINES/TUBES: 12/2: Right Femoral Swan   DISCUSSION: Pt presents with STEMI after what patient thought was viral illness. Cath with severe 3Vd.Impella placed for cardiogenic shock. Developed fever of 104  on 12/3 and WBC spike to 38.8 on 12/4with concern for sepsis after fever spikes. SVR dropped from 11 on 12/3 to 5 on 12/4. No obvious source, but suspect pneumonia. Lactic acid 1.4, PCT: 6. LDH 1161. Cardiac Index 3.86. Currently protecting airway. No pressors or inotropes needed.CCM have been asked to assist with management of suspected sepsis.   ASSESSMENT / PLAN:  PULMONARY A: Acute Respiratory failure >> 6 L [place] Current Smoker 1 ppd PTA Interstitial Lung  Disease per CXR c/w edema RUL infiltrate concerning - PNA Currently protecting airway  P:   pcxr follow up for RUL I think the rul is PNA a contributor to initial big temp Requires lasix to neg balance for pulm edema evident  CARDIOVASCULAR A:  SP STEMI Acute Systolic HF Severe CAD>> TCTS evaluating for CABG once  fever resolved Cardiogenic Shock with Impella Device Assist>> no pressors or  inotropes at present CVP 9 CO 8.57 CI 3.9 P: Levophed is off impella needs per chf Reviewed cvts note, treating infection, cabg in future Cortisol adequate, dc roids  RENAL A:  Elevated Creatinine Bloody urine 12/3>> resolved 12/4 with LVAD adjustments UTI, see ID hyponatremia P:   Lasix bmet  kvo Follow Na trend  GASTROINTESTINAL A:   Pt. No pain reported P:   SUP: Consider pepcid Q 24  HEMATOLOGIC A:   Anemia Anticoagulation Hematuria 12/3>> resolved impella P:  Trend CBC Transfuse for HGB <7 DVT prophylaxis:Heparin Gtt Monitor for obvious source of bleeding Trend LDH- down   INFECTIOUS A:  Suspected Sepsis ? Lung source aND has UTI  pseudomonas t max overall down leukocytosis P:   Pct down Have 2 sources that are infected, know Urine source, zosyn covering But would not change to cipro until we have longer observation lung organims Also keep vanc for now Would change foley or dc this especially as dec 2 placed, infection urine dec 3 and with pseudo will be hard to resolve this without foley removed Follow wbc Repeat sputum  ENDOCRINE A:   DM II   P:   CBG Q 4 SSI  NEUROLOGIC A:   No Acute Issues Sedation not required P:   Mobility when able   FAMILY  - Updates: Wife and son updated at bedside 12/5 by me  - Inter-disciplinary family meet or Palliative Care meeting due by:  12/9/ 2018  Lavon Paganini. Titus Mould, MD, [place] Pgr: [place] [facility]

## 2017-04-03 NOTE — Progress Notes (Signed)
ACTs have been therapeutic all shift; no change in heparin gtt rate per protocol. Will continue to monitor pt.  Sherlie Ban, RN

## 2017-04-03 NOTE — Progress Notes (Signed)
Pt and family requesting no more cooling blanket this evening; temperature elevated - given PRN tylenol and Zosyn currently infusing. Will continue to closely monitor.  Sherlie Ban, RN

## 2017-04-03 NOTE — Plan of Care (Signed)
  Elimination: Will not experience complications related to urinary retention 04/03/2017 2029 - Completed/Met by Sherlie Ban, RN Note Pt has voided post foley removal.

## 2017-04-03 NOTE — Progress Notes (Signed)
[place] for heparin Indication: Impella while awaiting CABG  No Known Allergies  Patient Measurements: Height: 6\' 2"  (188 cm) Weight: 211 lb 13.8 oz (96.1 kg) IBW/kg (Calculated) : 82.2  Vital Signs: Temp: 99.5 F (37.5 C) (12/05 0700) Temp Source: Core (Comment) (12/05 0700) BP: 115/72 (12/05 0700) Pulse Rate: 106 (12/05 0700)  Labs: Recent Labs    03/31/17 1810 03/31/17 2341 04/01/17 0235 04/02/17 0321 04/03/17 0358  HGB 14.2  --  13.2  12.9* 12.6* 10.4*  HCT 42.2  --  38.5*  38.0* 36.8* 31.1*  PLT 226  --  226 200 175  APTT 31  --   --   --   --   LABPROT 14.4  --   --   --   --   INR 1.13  --   --   --   --   CREATININE 1.24  --  1.27* 1.27* 1.05  TROPONINI 4.20* 53.99*  --   --   --      Estimated Creatinine Clearance: 75 mL/min (by C-G formula based on SCr of 1.05 mg/dL).   Medications:  Infusions:  . sodium chloride 250 mL (04/03/17 0700)  . sodium chloride 10 mL/hr at 04/03/17 0700  . impella catheter heparin 50 unit/mL in dextrose 5%    . heparin 350 Units/hr (04/03/17 0700)  . norepinephrine (LEVOPHED) Adult infusion Stopped (04/02/17 0845)  . piperacillin-tazobactam (ZOSYN)  IV 3.375 g (04/03/17 0600)  . vancomycin Stopped (04/03/17 0130)    Assessment: 71yo male c/o CP and SOB, called code STEMI and taken urgently to cath lab where high-risk PCI was deferred and Impella was placed.  ACTs currently at goal (164) on heparin 350 units/hr systemic + 765 units/hr from Impella purge solution.  Today's urine more clear.  Hgb trending down, no overt bleeding noted.  Goal of Therapy:   ACT 160-180 sec Monitor platelets by anticoagulation protocol: Yes   Plan:  Will continue monitoring ACT w/ RN and adjust heparin gtt accordingly.  Continue to monitor hemolysis.  [name], BCPS  Clinical Pharmacist Pager [phone]  04/03/2017 8:02 AM

## 2017-04-04 ENCOUNTER — Inpatient Hospital Stay (HOSPITAL_COMMUNITY): Payer: Medicare Other

## 2017-04-04 DIAGNOSIS — I213 ST elevation (STEMI) myocardial infarction of unspecified site: Secondary | ICD-10-CM

## 2017-04-04 DIAGNOSIS — Z01810 Encounter for preprocedural cardiovascular examination: Secondary | ICD-10-CM

## 2017-04-04 DIAGNOSIS — J9601 Acute respiratory failure with hypoxia: Secondary | ICD-10-CM

## 2017-04-04 LAB — PULMONARY FUNCTION TEST
FEF 25-75 Pre: 0.6 L/sec
FEF2575-%PRED-PRE: 21 %
FEV1-%PRED-PRE: 36 %
FEV1-Pre: 1.37 L
FEV1FVC-%PRED-PRE: 81 %
FEV6-%PRED-PRE: 45 %
FEV6-PRE: 2.17 L
FEV6FVC-%Pred-Pre: 99 %
FVC-%PRED-PRE: 45 %
FVC-PRE: 2.3 L
Pre FEV1/FVC ratio: 60 %
Pre FEV6/FVC Ratio: 95 %

## 2017-04-04 LAB — COOXEMETRY PANEL
Carboxyhemoglobin: 1.2 % (ref 0.5–1.5)
Carboxyhemoglobin: 0.8 % (ref 0.5–1.5)
METHEMOGLOBIN: 1.1 % (ref 0.0–1.5)
Methemoglobin: 0.9 % (ref 0.0–1.5)
O2 SAT: 58.6 %
O2 Saturation: 62.3 %
TOTAL HEMOGLOBIN: 14.8 g/dL (ref 12.0–16.0)
Total hemoglobin: 11.5 g/dL — ABNORMAL LOW (ref 12.0–16.0)

## 2017-04-04 LAB — BLOOD GAS, ARTERIAL
ACID-BASE DEFICIT: 1.6 mmol/L (ref 0.0–2.0)
Bicarbonate: 21.5 mmol/L (ref 20.0–28.0)
O2 Content: 3 L/min
O2 Saturation: 91.5 %
PCO2 ART: 29.5 mmHg — AB (ref 32.0–48.0)
PH ART: 7.476 — AB (ref 7.350–7.450)
Patient temperature: 98.6
pO2, Arterial: 59.2 mmHg — ABNORMAL LOW (ref 83.0–108.0)

## 2017-04-04 LAB — GLUCOSE, CAPILLARY
GLUCOSE-CAPILLARY: 118 mg/dL — AB (ref 65–99)
GLUCOSE-CAPILLARY: 139 mg/dL — AB (ref 65–99)
Glucose-Capillary: 128 mg/dL — ABNORMAL HIGH (ref 65–99)
Glucose-Capillary: 111 mg/dL — ABNORMAL HIGH (ref 65–99)

## 2017-04-04 LAB — CALCIUM, IONIZED: CALCIUM, IONIZED, SERUM: 4.3 mg/dL — AB (ref 4.5–5.6)

## 2017-04-04 LAB — CBC WITH DIFFERENTIAL/PLATELET
Basophils Absolute: 0 10*3/uL (ref 0.0–0.1)
Basophils Relative: 0 %
EOS PCT: 0 %
Eosinophils Absolute: 0 10*3/uL (ref 0.0–0.7)
HEMATOCRIT: 31.1 % — AB (ref 39.0–52.0)
Hemoglobin: 10.4 g/dL — ABNORMAL LOW (ref 13.0–17.0)
LYMPHS PCT: 6 %
Lymphs Abs: 2 10*3/uL (ref 0.7–4.0)
MCH: 28.9 pg (ref 26.0–34.0)
MCHC: 33.4 g/dL (ref 30.0–36.0)
MCV: 86.4 fL (ref 78.0–100.0)
MONO ABS: 3.7 10*3/uL — AB (ref 0.1–1.0)
MONOS PCT: 11 %
NEUTROS PCT: 83 %
Neutro Abs: 27.5 10*3/uL — ABNORMAL HIGH (ref 1.7–7.7)
Platelets: 183 10*3/uL (ref 150–400)
RBC: 3.6 MIL/uL — AB (ref 4.22–5.81)
RDW: 14 % (ref 11.5–15.5)
WBC: 33.2 10*3/uL — AB (ref 4.0–10.5)

## 2017-04-04 LAB — POCT ACTIVATED CLOTTING TIME
ACTIVATED CLOTTING TIME: 164 s
ACTIVATED CLOTTING TIME: 169 s
ACTIVATED CLOTTING TIME: 169 s
ACTIVATED CLOTTING TIME: 169 s
Activated Clotting Time: 169 seconds
Activated Clotting Time: 169 seconds
Activated Clotting Time: 147 seconds
Activated Clotting Time: 191 seconds

## 2017-04-04 LAB — EXPECTORATED SPUTUM ASSESSMENT W GRAM STAIN, RFLX TO RESP C

## 2017-04-04 LAB — BASIC METABOLIC PANEL
ANION GAP: 11 (ref 5–15)
BUN: 22 mg/dL — AB (ref 6–20)
CO2: 20 mmol/L — AB (ref 22–32)
Calcium: 7.5 mg/dL — ABNORMAL LOW (ref 8.9–10.3)
Chloride: 97 mmol/L — ABNORMAL LOW (ref 101–111)
Creatinine, Ser: 1.16 mg/dL (ref 0.61–1.24)
GFR calc Af Amer: >60 mL/min (ref >60)
GFR calc non Af Amer: >60 mL/min (ref >60)
GLUCOSE: 98 mg/dL (ref 65–99)
POTASSIUM: 3.4 mmol/L — AB (ref 3.5–5.1)
Sodium: 128 mmol/L — ABNORMAL LOW (ref 135–145)

## 2017-04-04 LAB — MAGNESIUM: Magnesium: 2.1 mg/dL (ref 1.7–2.4)

## 2017-04-04 LAB — LACTATE DEHYDROGENASE: LDH: 768 U/L — AB (ref 98–192)

## 2017-04-04 MED ORDER — POTASSIUM CHLORIDE CRYS ER 20 MEQ PO TBCR
40.0000 meq | EXTENDED_RELEASE_TABLET | Freq: Once | ORAL | Status: AC
  Administered 2017-04-04: 40 meq via ORAL
  Filled 2017-04-04: qty 2

## 2017-04-04 MED ORDER — SPIRONOLACTONE 25 MG PO TABS
12.5000 mg | ORAL_TABLET | Freq: Every day | ORAL | Status: DC
  Administered 2017-04-04 – 2017-04-07 (×4): 12.5 mg via ORAL
  Filled 2017-04-04 (×2): qty 0.5
  Filled 2017-04-04: qty 1
  Filled 2017-04-04 (×2): qty 0.5
  Filled 2017-04-04: qty 1
  Filled 2017-04-04: qty 0.5
  Filled 2017-04-04 (×2): qty 1

## 2017-04-04 NOTE — Care Management Note (Addendum)
Case Management Note  Patient Details  Name: [name] MRN: [id number] Date of Birth: 1945/10/31  Subjective/Objective:   Presents with STEMI,  From home with wife, who can asst 24/7,  cath with 3 vessel dz, impella placed,  developed fever 104 and wbc spike to 38.8, with suspected pna and susppected sepsis.    12/12 [place], BSN - POD 2 CABG, conts on epi, milrinone and levophed, cxr looks wet per MD note will start lasix, wbc elevated conts on zosyn.                Action/Plan: NCM will follow for dc needs.   Expected Discharge Date:                  Expected Discharge Plan:     In-House Referral:     Discharge planning Services  CM Consult  Post Acute Care Choice:    Choice offered to:     DME Arranged:    DME Agency:     HH Arranged:    HH Agency:     Status of Service:  In process, will continue to follow  If discussed at Long Length of Stay Meetings, dates discussed:    Additional Comments:  Zenon Mayo, RN 04/04/2017, 4:00 PM

## 2017-04-04 NOTE — Progress Notes (Signed)
[facility] / CRITICAL CARE MEDICINE   Name: [name] [name] MRN: [id number] DOB: 08-19-1945    ADMISSION DATE:  03/31/2017 CONSULTATION DATE:  04/02/2017  REFERRING MD: Oda Kilts, PA  CHIEF COMPLAINT: Sepsis  HISTORY OF PRESENT ILLNESS:   [name] [name] is a 71 y.o. male with PMH of HTN, DM, and previous hx of bladder cancer.  Pt presented to [facility] 03/31/17 pm with acute dyspnea. Had felt bad since 03/29/17. Pt initially though he had the flu, but also began to have CP and progressive dyspnea. ECG with STEMI in V1-V4 with reciprocal changes inferiorly and laterally. Troponin Peak 53.99, BNP 787. Taken emergently to cath lab.  Cath showed severe native CAD with occluded RCA and extensive calcified disease in the LAD including 2 severe lesions that are not favorable for PCI. Diffuse CAD also noted in Cx that appears 70%. Impella device placed for support and TCTS consulted for CABG consideration.  P4, Flow 2.4  Breathing initially  improved. States he had only felt bad for the past ~ week. Prior to that was at his USOH, and had no DOE walking on flat ground, or doing any ADLs. No personal cardiac history prior to admit. States his sister had an MI in her 46s. Current 1 ppd smoker with 40 year pack history. Pt was having  pink/reddish blood tinged sputum. He started spiking temps 04/01/2017 with T max of 104.2, SVR fell from 1100 to 541 overnight. He was initially diuresed for cardiogenic shock 12/3. There is concern for septic shock 12/4, and patient is receiving 500 cc  fluid bolus, BP is soft. PCCM was asked to see patient for assistance in management of suspected septic shock, ? Pulmonary source.  SUBJECTIVE:  Remains febrile to 101.3.  Impella remains, may take out today. Off pressors Per RN has scrotal pain and swelling, right scrotum firm  VITAL SIGNS: BP 114/73   Pulse 93   Temp 99.3 F (37.4 C) (Core (Comment))   Resp 16   Ht 6\' 2"  (1.88 m)   Wt 99.8 kg (220 lb 0.3 oz)   SpO2  93%   BMI 28.25 kg/m   HEMODYNAMICS: PAP: (29-48)/(15-31) 36/21 CVP:  [4 mmHg-16 mmHg] 11 mmHg PCWP:  [13 mmHg-16 mmHg] 13 mmHg CO:  [7.2 L/min-9.3 L/min] 8.2 L/min CI:  [3.2 L/min/m2-4.2 L/min/m2] 3.7 L/min/m2   INTAKE / OUTPUT: I/O last 3 completed shifts: In: 2866.9 [P.O.:480; I.V.:846; Other:553.4; IV Piggyback:987.5] Out: 3095 [Urine:3095]  PHYSICAL EXAMINATION: General: awake, alert, appropriate, no acute distress Neuro: nonfocal exam, perrl HEENT: Mucous membranes moist, mild Jvd  PULM: Respirations even and nonlabored on nasal cannula, coarse ronchi CV:  s1 s2 rrt impella GI: soft, BS  No r/g, not tender, right scrotum firm, mildly tender Extremities: no rash, mild edema, right groin impella site clean and dry, no swelling, no hematoma   LABS:  BMET Recent Labs  Lab 04/02/17 0321 04/03/17 0358 04/04/17 0400  NA 128* 129* 128*  K 4.1 3.5 3.4*  CL 99* 99* 97*  CO2 16* 19* 20*  BUN 21* 22* 22*  CREATININE 1.27* 1.05 1.16  GLUCOSE 133* 123* 98    Electrolytes Recent Labs  Lab 04/02/17 0321 04/03/17 0358 04/04/17 0400  CALCIUM 7.7* 7.5* 7.5*  MG 2.0 2.2 2.1    CBC Recent Labs  Lab 04/02/17 0321 04/03/17 0358 04/04/17 0400  WBC 38.8* 35.3* 33.2*  HGB 12.6* 10.4* 10.4*  HCT 36.8* 31.1* 31.1*  PLT 200 175 183    Coag's Recent  Labs  Lab 03/31/17 1810  APTT 31  INR 1.13    Sepsis Markers Recent Labs  Lab 03/31/17 2341 04/01/17 0235 04/01/17 1206 04/02/17 0321 04/02/17 0827 04/03/17 0358  LATICACIDVEN 1.4 1.6  --   --  1.4  --   PROCALCITON  --   --  0.42 6.43  --  5.15    ABG Recent Labs  Lab 04/02/17 0500 04/03/17 0410 04/04/17 0414  PHART 7.446 7.445 7.476*  PCO2ART 26.9* 29.7* 29.5*  PO2ART 64.4* 108 59.2*    Liver Enzymes Recent Labs  Lab 03/31/17 1810  AST 58*  ALT 27  ALKPHOS 86  BILITOT 1.3*  ALBUMIN 3.2*    Cardiac Enzymes Recent Labs  Lab 03/31/17 1810 03/31/17 2341  TROPONINI 4.20* 53.99*     Glucose Recent Labs  Lab 04/02/17 2114 04/03/17 0806 04/03/17 1208 04/03/17 1606 04/03/17 2136 04/04/17 0825  GLUCAP 124* 194* 145* 145* 103* 128*    Imaging Dg Chest Port 1 View  Result Date: 04/04/2017 CLINICAL DATA:  Fever.  Pneumonia.  Impella. EXAM: PORTABLE CHEST 1 VIEW COMPARISON:  04/03/2017 and previous FINDINGS: Chronic cardiomegaly. Impella appears unchanged. Venous catheter inserted from a femoral approach has its tip in the right middle or lower lobe pulmonary artery. Mild interstitial edema is similar to yesterday. No worsening or new finding. IMPRESSION: No change. Mild interstitial edema. Impella and pulmonary arterial catheter appear unchanged. Electronically Signed   By: Nelson Chimes M.D.   On: 04/04/2017 08:55     STUDIES:  04/02/2017 CXR:  Bilateral interstitial disease, left greater than right, worsening in the right upper lobe. This could reflect worsening edema or Infection.  12/3 Echo: EF 15-20%, with diffuse hypokinesis, Grade I diastolic Dysfunction, Aortic Valve moderate regurg   [facility] 03/31/17  Prox RCA lesion is 100% stenosed. Dist RCA lesion is 100% stenosed (retrograde filling from collaterals does not go beyond the distal vessel) -->faint collateral filling to the distal PL and PDA from septal perforators and a diffusely diseased AV groove circumflex  LAV Groove lesion is 90% stenosed.  Prox LAD-1 lesion is 95% stenosed. Prox LAD-2 lesion is 85% stenosed. Heavily calcified, tandem lesions in very tortuous segment of the vessel.  Prox Cx to Mid Cx lesion is 70% stenosed. Heavily calcified  There is severe left ventricular systolic dysfunction. The left ventricular ejection fraction is less than 25% by visual estimate.   RHC Procedural Findings: PAP/mean: 45/27/37 mmHg PCWP: 32-35 mmHg Elevated LV EDP consistent with volume overload.  LVP/EDP: 117/21/37 mmHg  AO sat 89% PA sat 52%. Cardiac output/index (Fick): 4.09,  1.86.   CULTURES: 12/2>> Viral Panel>> Negative 12/3>> Blood>> 12/3>> Urine>>pseudomonas>> pan sens  12/4>> Respiratory Culture>> 12/5 sputum >>>    ANTIBIOTICS: 04/01/2017>> Vanc 04/01/2017>> Zosyn  SIGNIFICANT EVENTS: 12/2/201: Admit with STEMI>> cardiogenic shock 12/2 Cath/ Impella Placement  12/3 : Temp spike to 104.2>> ? Sepsis, PCT 6, WBC 38, LA 1.4  LINES/TUBES: 12/2: Right Femoral Swan   DISCUSSION: Pt presents with STEMI after what patient thought was viral illness. Cath with severe 3Vd.Impella placed for cardiogenic shock. Developed fever of 104  on 12/3 and WBC spike to 38.8 on 12/4with concern for sepsis after fever spikes. SVR dropped from 11 on 12/3 to 5 on 12/4. No obvious source, but suspect pneumonia. Lactic acid 1.4, PCT: 6. LDH 1161. Cardiac Index 3.86. Currently protecting airway. No pressors or inotropes needed.CCM have been asked to assist with management of suspected sepsis.   ASSESSMENT / PLAN:  PULMONARY A: Acute Respiratory failure >> 6 L [place] Current Smoker 1 ppd PTA Interstitial Lung  Disease per CXR c/w edema RUL infiltrate concerning - PNA Currently protecting airway  P:   Continue antibiotics as above Follow-up chest x-ray Pulmonary hygiene Follow sputum culture Diuresis per cards as renal function and BP will allow   CARDIOVASCULAR A:  SP STEMI Acute Systolic HF Severe CAD>> TCTS evaluating for CABG once  fever resolved Cardiogenic Shock with Impella Device Assist>> no pressors or  inotropes at present CVP 9 CO 8.57 CI 3.9 P: CHF team managing Off stress steroids Off pressors   RENAL A:  Elevated Creatinine Bloody urine 12/3>> resolved 12/4 with LVAD adjustments UTI, see ID hyponatremia P:   Diuresis as above Follow-up chemistry Will order scrotal ultrasound See ID    GASTROINTESTINAL A:   No active issue P:   SUP: Consider pepcid Q 24  HEMATOLOGIC A:   Anemia Anticoagulation Hematuria 12/3>>  resolved impella P:  Trend CBC Transfuse for HGB <7 DVT prophylaxis:Heparin Gtt Monitor for obvious source of bleeding Trend LDH- down   INFECTIOUS A:  Suspected Sepsis ? Lung source aND has UTI pseudomonas t max overall down leukocytosis P:   Follow culture data Scrotal ultrasound as above Sputum culture resent 12/5 Keep Foley out as able Continue Vanco and Zosyn as above for now Narrow as able Trend WBC, trend pro-calcitonin   ENDOCRINE A:   DM II   P:   CBG Q 4 SSI  NEUROLOGIC A:   No Acute Issues Sedation not required P:   Mobilize when able   FAMILY  - Updates: Patient and wife updated at bedside 12/6  - Inter-disciplinary family meet or Palliative Care meeting due by:  12/9/ 2018    Nickolas Madrid, NP 04/04/2017  9:02 AM Pager: [phone] or [phone]  Attending Note:  71 year old male with CHF and cardiogenic shock that developed sepsis and required pressors.  Patient has improved some and stress dose steroids were discontinued.  He is awake and interactive on exam with bibasilar crackles.  I reviewed CXR myself, edema noted.  Discussed with PCCM-NP.  Will continue pressor support.  Continue abx for now.  F/U on cultures.  Diureses as renal function allows.  Continue heparin for now.  PCCM will follow.  The patient is critically ill with multiple organ systems failure and requires high complexity decision making for assessment and support, frequent evaluation and titration of therapies, application of advanced monitoring technologies and extensive interpretation of multiple databases.   Critical Care Time devoted to patient care services described in this note is  35  Minutes. This time reflects time of care of this signee Dr Jennet Maduro. This critical care time does not reflect procedure time, or teaching time or supervisory time of PA/NP/Med student/Med Resident etc but could involve care discussion time.  Rush Farmer, M.D. [facility]  Pulmonary/Critical Care Medicine. Pager: [phone]. After hours pager: [phone].

## 2017-04-04 NOTE — Progress Notes (Signed)
Pre-op Cardiac Surgery  Carotid Findings:  Findings suggest 1-39% internal carotid artery stenosis bilaterally. Bilateral external carotid arteries exhibit elevated velocities suggestive of >50% stenosis.  Upper Extremity Right Left  Brachial Pressures 112-Patent Patent-Unable to obtain pressure due to line location.  Radial Waveforms Patent Patent  Ulnar Waveforms Patent Patent  Palmar Arch (Allen's Test) Signal decreases <50% with both radial and ulnar compression. Unable to evaluate due to line location.   Note:patient has Impella device in place.   Lower  Extremity Right Left  Dorsalis Pedis 52-patent 88-patent  Anterior Tibial    Posterior Tibial 72-patent 87-patent  Ankle/Brachial Indices 0.64 0.79    Findings:   The right ABI is suggestive of moderate arterial insufficiency at rest, the left ABI is suggestive of moderate, borderline mild, arterial insufficiency at rest.  04/04/2017 1:52 PM Maudry Mayhew, BS, RVT, RDCS, RDMS

## 2017-04-04 NOTE — Progress Notes (Signed)
ANTICOAGULATION CONSULT NOTE - Follow Up Consult  Pharmacy Consult for heparin Indication: Impella while awaiting CABG  No Known Allergies  Patient Measurements: Height: 6\' 2"  (188 cm) Weight: 220 lb 0.3 oz (99.8 kg) IBW/kg (Calculated) : 82.2  Vital Signs: Temp: 99.9 F (37.7 C) (12/06 1300) Temp Source: Core (12/06 1300) BP: 112/63 (12/06 1300) Pulse Rate: 94 (12/06 1300)  Labs: Recent Labs    04/02/17 0321 04/03/17 0358 04/04/17 0400  HGB 12.6* 10.4* 10.4*  HCT 36.8* 31.1* 31.1*  PLT 200 175 183  CREATININE 1.27* 1.05 1.16     Estimated Creatinine Clearance: 73.7 mL/min (by C-G formula based on SCr of 1.16 mg/dL).   Assessment: 71yo male c/o CP and SOB, called code STEMI and taken urgently to cath lab where high-risk PCI was deferred and Impella was placed.  ACTs currently at goal (160s) on heparin 350 units/hr systemic + 775 units/hr from Impella purge solution.  Urine clearing up.  LDH trending down. CBC stable. No overt bleeding noted.  Noted plan to possibly pull the Impella later today. Will discuss with Dr. Haroldine Laws if heparin will need to be restarted for his multivessel CAD  Goal of Therapy:   ACT 160-180 sec Monitor platelets by anticoagulation protocol: Yes   Plan:  1) Will continue to monitor ACTs with RN and adjust heparin accordingly 2) Follow up plans to pull impella +/- restart heparin  Nena Jordan, Pharm D, BCPS   04/04/2017 1:36 PM

## 2017-04-04 NOTE — Progress Notes (Signed)
Heparin stopped at 1515 per order from Dr. Haroldine Laws. Decision made to keep Impella in overnight. Heparin restarted per order from Dr. Haroldine Laws at previous rate. Heparin restarted at this time.

## 2017-04-04 NOTE — Progress Notes (Addendum)
Advanced Heart Failure Rounding Note  Subjective:    Admitted 03/31/17 with CP -> STEMI -> Cath with severe 3Vd. 04/01/17 developed fever up to 104. Cultures drawn  Impella turned down to P4 04/01/17 evening with elevated LDH. LDH trending down since. PCCM consulted 04/02/17   Resp panel 04/01/17 negative UCx 04/01/17 with Psuedomonas aeruginosa BCx 04/01/17 NGTD Sputum Cx 04/02/17 with "rare gram + cocci" in sputum.   Feeling somewhat better this am.  Still very fatigued, and malaise much worse when fever spikes. Denies SOB or CP this am.   Impella p3, Flow 2.3, no alarms. LDH 1293 -> 1176 -> 904 -> 768  CVP 8  WBC 33.2. Tmax 101.3. PCT 5.15 and 04/03/17. Lactic Acid 1.4 on 04/02/17.  CXR 04/03/17 bilateral interstitial changes. ? RUL infiltrate on personal review.    Swan numbers CVP 10 CO 8.57 CI 3.86 PAP 24/17 (20) SVR 541  Objective:   Weight Range: 220 lb 0.3 oz (99.8 kg) Body mass index is 28.25 kg/m.   Vital Signs:   Temp:  [99.3 F (37.4 C)-101.3 F (38.5 C)] 99.3 F (37.4 C) (12/06 0700) Pulse Rate:  [87-112] 93 (12/06 0700) Resp:  [13-24] 16 (12/06 0700) BP: (92-140)/(48-102) 114/73 (12/06 0700) SpO2:  [89 %-96 %] 93 % (12/06 0700) Arterial Line BP: (99-187)/(49-77) 134/54 (12/06 0700) Weight:  [220 lb 0.3 oz (99.8 kg)] 220 lb 0.3 oz (99.8 kg) (12/06 0400) Last BM Date: 03/01/17 Weight change: Filed Weights   04/02/17 0400 04/03/17 0400 04/04/17 0400  Weight: 215 lb 13.3 oz (97.9 kg) 211 lb 13.8 oz (96.1 kg) 220 lb 0.3 oz (99.8 kg)   Intake/Output:   Intake/Output Summary (Last 24 hours) at 04/04/2017 0735 Last data filed at 04/04/2017 0700 Gross per 24 hour  Intake 2051.3 ml  Output 2485 ml  Net -433.7 ml    Physical Exam    General: Critically ill appearing.  HEENT: Normal Neck: Supple. JVP 8-9 cm. Carotids 2+ bilat; no bruits. No thyromegaly or nodule noted. Cor: PMI nondisplaced. RRR, No M/G/R noted Lungs: Diminished throughout, +  crackles. Abdomen: Soft, non-tender, non-distended, no HSM. No bruits or masses. +BS  Extremities: No cyanosis, clubbing, or rash. R groin site stable with Impella and swan. R DP faint to 1+. Warm to the touch this am.  Neuro: Alert & orientedx3, cranial nerves grossly intact. moves all 4 extremities w/o difficulty. Affect flat but appropriate.   Telemetry   NSR -> ST 90-100s, personally reviewed.   EKG   No new tracing.   Labs    CBC Recent Labs    04/03/17 0358 04/04/17 0400  WBC 35.3* 33.2*  NEUTROABS 30.4* 27.5*  HGB 10.4* 10.4*  HCT 31.1* 31.1*  MCV 87.6 86.4  PLT 175 657   Basic Metabolic Panel Recent Labs    04/03/17 0358 04/04/17 0400  NA 129* 128*  K 3.5 3.4*  CL 99* 97*  CO2 19* 20*  GLUCOSE 123* 98  BUN 22* 22*  CREATININE 1.05 1.16  CALCIUM 7.5* 7.5*  MG 2.2 2.1   Liver Function Tests No results for input(s): AST, ALT, ALKPHOS, BILITOT, PROT, ALBUMIN in the last 72 hours. No results for input(s): LIPASE, AMYLASE in the last 72 hours. Cardiac Enzymes No results for input(s): CKTOTAL, CKMB, CKMBINDEX, TROPONINI in the last 72 hours.  BNP: BNP (last 3 results) Recent Labs    03/31/17 2341  BNP 786.9*    ProBNP (last 3 results) No results for input(s): PROBNP  in the last 8760 hours.   D-Dimer No results for input(s): DDIMER in the last 72 hours. Hemoglobin A1C No results for input(s): HGBA1C in the last 72 hours. Fasting Lipid Panel No results for input(s): CHOL, HDL, LDLCALC, TRIG, CHOLHDL, LDLDIRECT in the last 72 hours. Thyroid Function Tests No results for input(s): TSH, T4TOTAL, T3FREE, THYROIDAB in the last 72 hours.  Invalid input(s): FREET3  Other results:   Imaging    No results found.   Medications:     Scheduled Medications: . aspirin EC  81 mg Oral Daily  . atorvastatin  80 mg Oral QHS  . Chlorhexidine Gluconate Cloth  6 each Topical q morning - 10a  . digoxin  0.125 mg Oral Daily  . enalapril  2.5 mg Oral  BID  . insulin aspart  0-20 Units Subcutaneous TID WC  . mouth rinse  15 mL Mouth Rinse BID  . sodium chloride flush  10-40 mL Intracatheter Q12H  . sodium chloride flush  3 mL Intravenous Q12H  . spironolactone  12.5 mg Oral Daily    Infusions: . sodium chloride 250 mL (04/04/17 0700)  . sodium chloride 10 mL/hr at 04/04/17 0700  . impella catheter heparin 50 unit/mL in dextrose 5%    . heparin 350 Units/hr (04/04/17 0700)  . norepinephrine (LEVOPHED) Adult infusion Stopped (04/02/17 0845)  . piperacillin-tazobactam (ZOSYN)  IV 3.375 g (04/04/17 0600)  . vancomycin Stopped (04/04/17 0130)    PRN Medications: sodium chloride, acetaminophen, hydrALAZINE, morphine injection, nitroGLYCERIN, ondansetron (ZOFRAN) IV, oxyCODONE, sodium chloride flush, sodium chloride flush   Patient Profile   71 y/o with anterior STEMI who developed cardiogenic shock. 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 Impella placed for stabilization of shock.   Assessment/Plan   1. Cardiogenic shock - Impella placed overnight 12/2 into 12/3. Output improved.  - Impella P3, Flow 2.3. Waveform OK. Some concerns over his RLE circulation. Discussing timeline for removal. - LDH 1293 -> 1161 -> 904 -> 768 - BP labile.  2. ID -> Sepsis unclear source.  - Tmax 101.3. Remains on Vanc/Zosyn.  In 99-100 range this am.  - CCM following.  - levophed as needed - UCx with pseudomonas.  - BCx NGDT.  - Sputum culture 04/03/17 with rare gram+ cocci.   3. Acute systolic CHF in setting of STEMI: Echo with EF 15-20%.  - Ischemic CMP. Will need CABG once recovered from sepsis.  - Volume status mildly elevated. Received IV lasix 40 mg BID yesterday with 2.4 L UOP. CVP 7-8 this am.  - No BB for now.  - Continue digoxin 0.125 mg daily - Continue spiro 12.5 mg daily.  - Continue enalapril 2.5 mg BID  4. Severe CAD - Dr Cyndia Bent has seen. Plan CABG pending resolution of  fever and leukocytosis.  - Denies CP at this time.  - Hold BB with shock.  5. DM2 - Continue SSI. No change.   6. Hypomagnesemia - Stable today.   7. Tobacco abuse - Smoked 1 ppd PTA.  - Will continue to encourage complete cessation. No change.   8. HTN - Meds as above. Will treat in the setting of managing his HF. No change  9. Hx of bladder CA - Urologist has told him he may have intermittent hematuria, but UOP amber on arrival. So ? Hemolysis.  - Pt states he had multiple rounds of Bacillus Calmette-Guerin (BCG) therapy. No change.   10.  Hypokalemia - K 3.4 this am. Supp ordered.  - Continue spiro.   Patient is critically ill and remains in imminent danger of multiple organ failure.    Medication concerns reviewed with patient and pharmacy team. Barriers identified: None at this time.   Length of Stay: 4  Annamaria Helling  04/04/2017, 7:35 AM  Advanced Heart Failure Team Pager [phone] (M-F; 7a - 4p)  Please contact [facility] for night-coverage after hours (4p -7a ) and weekends on [email or web address]  Agree with above.   He remains critically ill. Persistently febrile. On Impella. Swan numbers as above. Mild volume overload. Good cardiac output. CXR still with mild pulmonary edema (Personally reviewed). Remains on broad spectrum abx. WBC 33k.   On exam Weak appearing Cor RRR + impella hum Lungs diffuse rhonchi Ab soft NT/ND Ext trace edema. R groin impella + swan. R foot warm DP 1+  Critically ill with mixture of septic and cardiogenic shock. Remains febrile but improving. Continue broad spectrum abx. Doubt he will be ready for CABG tomorrow. Most likely next week. Given hemodynamics probably can get Impella out today my only reluctance is increasing risk of cardiac ischemia. Will seehow he progresses throughout the day.   Impella repositioned personally under echo guidance at bedside. Flow 2.1L Good waveforms.   CRITICAL CARE Performed by:  Glori Bickers  Total critical care time: 45 minutes  Critical care time was exclusive of separately billable procedures and treating other patients.  Critical care was necessary to treat or prevent imminent or life-threatening deterioration.  Critical care was time spent personally by me (independent of midlevel providers or residents) on the following activities: development of treatment plan with patient and/or surrogate as well as nursing, discussions with consultants, evaluation of patient's response to treatment, examination of patient, obtaining history from patient or surrogate, ordering and performing treatments and interventions, ordering and review of laboratory studies, ordering and review of radiographic studies, pulse oximetry and re-evaluation of patient's condition.    Glori Bickers, MD  11:56 AM

## 2017-04-04 NOTE — Progress Notes (Signed)
ACTs have been therapeutic all shift; no change in heparin gtt rate per protocol. Will continue to monitor pt.  Sherlie Ban, RN

## 2017-04-05 ENCOUNTER — Inpatient Hospital Stay (HOSPITAL_COMMUNITY): Payer: Medicare Other

## 2017-04-05 DIAGNOSIS — R062 Wheezing: Secondary | ICD-10-CM

## 2017-04-05 DIAGNOSIS — J441 Chronic obstructive pulmonary disease with (acute) exacerbation: Secondary | ICD-10-CM

## 2017-04-05 DIAGNOSIS — Z95811 Presence of heart assist device: Secondary | ICD-10-CM

## 2017-04-05 DIAGNOSIS — F419 Anxiety disorder, unspecified: Secondary | ICD-10-CM

## 2017-04-05 LAB — BASIC METABOLIC PANEL
ANION GAP: 11 (ref 5–15)
BUN: 17 mg/dL (ref 6–20)
CALCIUM: 7.6 mg/dL — AB (ref 8.9–10.3)
CO2: 21 mmol/L — AB (ref 22–32)
CREATININE: 1.11 mg/dL (ref 0.61–1.24)
Chloride: 97 mmol/L — ABNORMAL LOW (ref 101–111)
GFR calc Af Amer: >60 mL/min (ref >60)
GFR calc non Af Amer: >60 mL/min (ref >60)
GLUCOSE: 125 mg/dL — AB (ref 65–99)
Potassium: 3.6 mmol/L (ref 3.5–5.1)
Sodium: 129 mmol/L — ABNORMAL LOW (ref 135–145)

## 2017-04-05 LAB — POCT ACTIVATED CLOTTING TIME
ACTIVATED CLOTTING TIME: 142 s
ACTIVATED CLOTTING TIME: 169 s
ACTIVATED CLOTTING TIME: 158 s
Activated Clotting Time: 164 seconds
Activated Clotting Time: 164 seconds
Activated Clotting Time: 164 seconds
Activated Clotting Time: 153 seconds
Activated Clotting Time: 153 seconds
Activated Clotting Time: 164 seconds

## 2017-04-05 LAB — CBC WITH DIFFERENTIAL/PLATELET
BASOS PCT: 0 %
Basophils Absolute: 0 10*3/uL (ref 0.0–0.1)
EOS PCT: 1 %
Eosinophils Absolute: 0.3 10*3/uL (ref 0.0–0.7)
HEMATOCRIT: 31.6 % — AB (ref 39.0–52.0)
Hemoglobin: 10.5 g/dL — ABNORMAL LOW (ref 13.0–17.0)
LYMPHS ABS: 1.7 10*3/uL (ref 0.7–4.0)
Lymphocytes Relative: 6 %
MCH: 29.2 pg (ref 26.0–34.0)
MCHC: 33.2 g/dL (ref 30.0–36.0)
MCV: 88 fL (ref 78.0–100.0)
MONOS PCT: 9 %
Monocytes Absolute: 2.6 10*3/uL — ABNORMAL HIGH (ref 0.1–1.0)
NEUTROS PCT: 84 %
Neutro Abs: 24 10*3/uL — ABNORMAL HIGH (ref 1.7–7.7)
Platelets: 199 10*3/uL (ref 150–400)
RBC: 3.59 MIL/uL — AB (ref 4.22–5.81)
RDW: 14.4 % (ref 11.5–15.5)
WBC: 28.6 10*3/uL — AB (ref 4.0–10.5)

## 2017-04-05 LAB — COOXEMETRY PANEL
CARBOXYHEMOGLOBIN: 0.7 % (ref 0.5–1.5)
Methemoglobin: 1.2 % (ref 0.0–1.5)
O2 SAT: 62.7 %
Total hemoglobin: 12.8 g/dL (ref 12.0–16.0)

## 2017-04-05 LAB — BLOOD GAS, ARTERIAL
Acid-base deficit: 2.4 mmol/L — ABNORMAL HIGH (ref 0.0–2.0)
BICARBONATE: 21 mmol/L (ref 20.0–28.0)
O2 Content: 6 L/min
O2 Saturation: 93.4 %
PCO2 ART: 31 mmHg — AB (ref 32.0–48.0)
PH ART: 7.445 (ref 7.350–7.450)
Patient temperature: 98.6
pO2, Arterial: 66.8 mmHg — ABNORMAL LOW (ref 83.0–108.0)

## 2017-04-05 LAB — LACTATE DEHYDROGENASE: LDH: 749 U/L — AB (ref 98–192)

## 2017-04-05 LAB — GLUCOSE, CAPILLARY
GLUCOSE-CAPILLARY: 132 mg/dL — AB (ref 65–99)
GLUCOSE-CAPILLARY: 194 mg/dL — AB (ref 65–99)
Glucose-Capillary: 123 mg/dL — ABNORMAL HIGH (ref 65–99)

## 2017-04-05 LAB — CALCIUM, IONIZED: Calcium, Ionized, Serum: 4.4 mg/dL — ABNORMAL LOW (ref 4.5–5.6)

## 2017-04-05 LAB — HEMOGLOBIN FREE, PLASMA
HGB PLASMA: 3.1 mg/dL (ref 0.0–4.9)
Hgb, Plasma: 0.9 mg/dL (ref 0.0–4.9)

## 2017-04-05 LAB — MAGNESIUM: Magnesium: 2.2 mg/dL (ref 1.7–2.4)

## 2017-04-05 MED ORDER — FUROSEMIDE 10 MG/ML IJ SOLN
80.0000 mg | Freq: Once | INTRAMUSCULAR | Status: AC
  Administered 2017-04-05: 80 mg via INTRAVENOUS

## 2017-04-05 MED ORDER — FENTANYL CITRATE (PF) 100 MCG/2ML IJ SOLN
25.0000 ug | Freq: Once | INTRAMUSCULAR | Status: AC
  Administered 2017-04-05: 25 ug via INTRAVENOUS

## 2017-04-05 MED ORDER — HYDRALAZINE HCL 20 MG/ML IJ SOLN
10.0000 mg | Freq: Once | INTRAMUSCULAR | Status: AC
  Administered 2017-04-05: 10 mg via INTRAVENOUS

## 2017-04-05 MED ORDER — FENTANYL CITRATE (PF) 100 MCG/2ML IJ SOLN
INTRAMUSCULAR | Status: AC
  Filled 2017-04-05: qty 2

## 2017-04-05 MED ORDER — HEPARIN SODIUM (PORCINE) 5000 UNIT/ML IJ SOLN
5000.0000 [IU] | Freq: Three times a day (TID) | INTRAMUSCULAR | Status: AC
  Administered 2017-04-06 – 2017-04-07 (×6): 5000 [IU] via SUBCUTANEOUS
  Filled 2017-04-05 (×6): qty 1

## 2017-04-05 MED ORDER — SALINE SPRAY 0.65 % NA SOLN
1.0000 | NASAL | Status: DC | PRN
Start: 2017-04-05 — End: 2017-04-08
  Filled 2017-04-05: qty 44

## 2017-04-05 MED ORDER — IPRATROPIUM-ALBUTEROL 0.5-2.5 (3) MG/3ML IN SOLN
3.0000 mL | Freq: Four times a day (QID) | RESPIRATORY_TRACT | Status: DC | PRN
  Administered 2017-04-05: 3 mL via RESPIRATORY_TRACT
  Filled 2017-04-05: qty 3

## 2017-04-05 MED ORDER — FUROSEMIDE 10 MG/ML IJ SOLN
INTRAMUSCULAR | Status: AC
  Filled 2017-04-05: qty 8

## 2017-04-05 MED ORDER — METHYLPREDNISOLONE SODIUM SUCC 40 MG IJ SOLR
40.0000 mg | Freq: Two times a day (BID) | INTRAMUSCULAR | Status: DC
  Administered 2017-04-05 – 2017-04-07 (×6): 40 mg via INTRAVENOUS
  Filled 2017-04-05 (×6): qty 1

## 2017-04-05 MED ORDER — ARFORMOTEROL TARTRATE 15 MCG/2ML IN NEBU
15.0000 ug | INHALATION_SOLUTION | Freq: Two times a day (BID) | RESPIRATORY_TRACT | Status: DC
  Administered 2017-04-05 – 2017-04-07 (×6): 15 ug via RESPIRATORY_TRACT
  Filled 2017-04-05 (×6): qty 2

## 2017-04-05 MED ORDER — BUDESONIDE 0.5 MG/2ML IN SUSP
0.5000 mg | Freq: Two times a day (BID) | RESPIRATORY_TRACT | Status: DC
  Administered 2017-04-05 – 2017-04-07 (×6): 0.5 mg via RESPIRATORY_TRACT
  Filled 2017-04-05 (×6): qty 2

## 2017-04-05 MED ORDER — PANTOPRAZOLE SODIUM 40 MG PO PACK
40.0000 mg | PACK | Freq: Every day | ORAL | Status: DC
  Administered 2017-04-05: 40 mg via ORAL
  Filled 2017-04-05 (×2): qty 20

## 2017-04-05 MED ORDER — OXYMETAZOLINE HCL 0.05 % NA SOLN
1.0000 | Freq: Two times a day (BID) | NASAL | Status: DC | PRN
  Administered 2017-04-05: 1 via NASAL
  Filled 2017-04-05: qty 15

## 2017-04-05 MED ORDER — FUROSEMIDE 10 MG/ML IJ SOLN
INTRAMUSCULAR | Status: AC
  Filled 2017-04-05: qty 4

## 2017-04-05 MED ORDER — LORAZEPAM 2 MG/ML IJ SOLN
0.5000 mg | INTRAMUSCULAR | Status: DC | PRN
  Administered 2017-04-05 – 2017-04-06 (×2): 0.5 mg via INTRAVENOUS
  Filled 2017-04-05 (×2): qty 1

## 2017-04-05 MED ORDER — FUROSEMIDE 10 MG/ML IJ SOLN
40.0000 mg | Freq: Once | INTRAMUSCULAR | Status: AC
  Administered 2017-04-05: 40 mg via INTRAVENOUS

## 2017-04-05 NOTE — Progress Notes (Signed)
Patient complaining of shortness of breath. Upon listening to lung sounds, patient sounds tight on left side. Oxygen increased from 4L to 6L for patient comfort. Cardiac Output 9.98. Hochrein, MD paged and notified. No new orders given at this time.

## 2017-04-05 NOTE — Progress Notes (Signed)
Tech talked to RN about getting the patient a bath and the nurse stated it would be best if we wait until some of the machines would be moved out to make it easier. Nurse unaware when the machine will be taken out. Tech will check again at a later time.

## 2017-04-05 NOTE — Progress Notes (Signed)
  Heparin held for 1 hour. ACT < 160.   Impella pump and sheath pulled. Manual pressure held personally for 52 mins. Good hemostasis.   Fem stop applied. R DP pulse confirmed with Doppler.   Will need 6 hour bedrest.  Glori Bickers, MD  7:54 PM

## 2017-04-05 NOTE — Progress Notes (Signed)
ANTICOAGULATION CONSULT NOTE - Follow Up Consult  Pharmacy Consult for heparin Indication: Impella while awaiting CABG  No Known Allergies  Patient Measurements: Height: 6\' 2"  (188 cm) Weight: 223 lb 12.3 oz (101.5 kg) IBW/kg (Calculated) : 82.2  Vital Signs: Temp: 99.5 F (37.5 C) (12/07 1100) Temp Source: Core (12/07 0400) BP: 127/69 (12/07 1100) Pulse Rate: 104 (12/07 1100)  Labs: Recent Labs    04/03/17 0358 04/04/17 0400 04/05/17 0352  HGB 10.4* 10.4* 10.5*  HCT 31.1* 31.1* 31.6*  PLT 175 183 199  CREATININE 1.05 1.16 1.11     Estimated Creatinine Clearance: 77.6 mL/min (by C-G formula based on SCr of 1.11 mg/dL).   Assessment: 71yo male c/o CP and SOB, called code STEMI and taken urgently to cath lab where high-risk PCI was deferred and Impella was placed.  ACTs currently at goal (160s) on heparin 350 units/hr systemic + 755 units/hr from Impella purge solution.  Urine clearing up.  LDH trending down. CBC stable. No overt bleeding noted.  Plan was to pull Impella yesterday but postponed until today. Will discuss with Dr. Haroldine Laws if heparin will need to be restarted for his multivessel CAD  Goal of Therapy:   ACT 160-180 sec Monitor platelets by anticoagulation protocol: Yes   Plan:  1) Will continue to monitor ACTs with RN and adjust heparin accordingly 2) Follow up plans to pull impella +/- restart heparin  Nena Jordan, Pharm D, BCPS   04/05/2017 11:59 AM

## 2017-04-05 NOTE — Progress Notes (Signed)
Advanced Heart Failure Rounding Note  Subjective:    Admitted 03/31/17 with CP -> STEMI -> Cath with severe 3Vd. 04/01/17 developed fever up to 104. Cultures drawn  Impella turned down to P4 04/01/17 evening with elevated LDH. LDH trending down since. PCCM consulted 04/02/17   Resp panel 04/01/17 negative UCx 04/01/17 with Psuedomonas aeruginosa BCx 04/01/17 NGTD Sputum Cx 04/02/17 with "rare gram + cocci" in sputum.   Feeling a little more SOB this am. Expiratory wheezing noted. Worried that his breathing will "get worse" when we take out impella.   Impella p2, Flow 2.0, no alarms LDH 1293 -> 1176 -> 904 -> 768 -> 749  CVP 9-10 this am. Got 40 mg IV lasix several hours ago. 1.7 L UOP yesterday.  WBC 28.6. Tmax 101.5. Low grade temp in 99-100.4 this am.   Scrotal US with swelling 04/04/17 - Possible acute R epididymitis.  - Moderate R hydrocele - Diffuse scrotal edema  CXR 04/03/17 bilateral interstitial changes. ? RUL infiltrate on personal review.    Swan numbers Done personally by Dr. Haroldine Laws at bedside.  CVP 12 CO 8.06 CI 3.63 PAP 45/26 (34)  Objective:   Weight Range: 223 lb 12.3 oz (101.5 kg) Body mass index is 28.73 kg/m.   Vital Signs:   Temp:  [98.5 F (36.9 C)-101.5 F (38.6 C)] 98.5 F (36.9 C) (12/07 0731) Pulse Rate:  [80-108] 102 (12/07 0731) Resp:  [13-26] 19 (12/07 0731) BP: (106-133)/(63-78) 123/71 (12/07 0731) SpO2:  [90 %-95 %] 95 % (12/07 0700) Arterial Line BP: (134-181)/(58-75) 156/68 (12/07 0700) Weight:  [223 lb 12.3 oz (101.5 kg)] 223 lb 12.3 oz (101.5 kg) (12/07 0500) Last BM Date: 03/31/17 Weight change: Filed Weights   04/03/17 0400 04/04/17 0400 04/05/17 0500  Weight: 211 lb 13.8 oz (96.1 kg) 220 lb 0.3 oz (99.8 kg) 223 lb 12.3 oz (101.5 kg)   Intake/Output:   Intake/Output Summary (Last 24 hours) at 04/05/2017 0814 Last data filed at 04/05/2017 0700 Gross per 24 hour  Intake 2083.35 ml  Output 1725 ml  Net 358.35 ml     Physical Exam    General:Ill appearing. NAD.  HEENT: Normal Neck: Supple. JVP 9-10 cm. Carotids 2+ bilat; no bruits. No thyromegaly or nodule noted. Cor: PMI nondisplaced. RRR, No M/G/R noted Lungs: Diminished throughout, Mild basilar crackles. Expiratory wheeze R>L Abdomen: Soft, non-tender, non-distended, no HSM. No bruits or masses. +BS  Extremities: No cyanosis, clubbing, or rash. R groin site stable with Impella and swan. R DP faint to 1+. Warm. Neuro: Alert & orientedx3, cranial nerves grossly intact. moves all 4 extremities w/o difficulty. Affect flat but appropriate.   Telemetry   NSR - ST 90-100s, Personally reviewed.   EKG   No new tracing.   Labs    CBC Recent Labs    04/04/17 0400 04/05/17 0352  WBC 33.2* 28.6*  NEUTROABS 27.5* 24.0*  HGB 10.4* 10.5*  HCT 31.1* 31.6*  MCV 86.4 88.0  PLT 183 268   Basic Metabolic Panel Recent Labs    04/04/17 0400 04/05/17 0352  NA 128* 129*  K 3.4* 3.6  CL 97* 97*  CO2 20* 21*  GLUCOSE 98 125*  BUN 22* 17  CREATININE 1.16 1.11  CALCIUM 7.5* 7.6*  MG 2.1 2.2   Liver Function Tests No results for input(s): AST, ALT, ALKPHOS, BILITOT, PROT, ALBUMIN in the last 72 hours. No results for input(s): LIPASE, AMYLASE in the last 72 hours. Cardiac Enzymes No results for  input(s): CKTOTAL, CKMB, CKMBINDEX, TROPONINI in the last 72 hours.  BNP: BNP (last 3 results) Recent Labs    03/31/17 2341  BNP 786.9*    ProBNP (last 3 results) No results for input(s): PROBNP in the last 8760 hours.   D-Dimer No results for input(s): DDIMER in the last 72 hours. Hemoglobin A1C No results for input(s): HGBA1C in the last 72 hours. Fasting Lipid Panel No results for input(s): CHOL, HDL, LDLCALC, TRIG, CHOLHDL, LDLDIRECT in the last 72 hours. Thyroid Function Tests No results for input(s): TSH, T4TOTAL, T3FREE, THYROIDAB in the last 72 hours.  Invalid input(s): FREET3  Other results:   Imaging    US  Scrotum  Result Date: 04/05/2017 CLINICAL DATA:  Initial evaluation for acute swelling for 4 days. EXAM: SCROTAL ULTRASOUND DOPPLER ULTRASOUND OF THE TESTICLES TECHNIQUE: Complete ultrasound examination of the testicles, epididymis, and other scrotal structures was performed. Color and spectral Doppler ultrasound were also utilized to evaluate blood flow to the testicles. COMPARISON:  Prior CT from 11/23/2014. FINDINGS: Right testicle Measurements: 4.0 x 2.7 x 2.7 cm. 4 mm complex cystic lesion at the level of the rete testes, likely focal tubular ectasia of or possibly a small spermatocele. No associated vascularity or other concerning features. This is of doubtful significance. No other mass lesion. No microlithiasis. Left testicle Measurements: 4.3 x 3.5 x 3.3 cm. No mass or microlithiasis visualized. Right epididymis: Right epididymis is somewhat enlarged and hypervascular as compared to the left, which may reflect sequelae of acute epididymitis. Left epididymis:  Normal in size and appearance. Hydrocele:  Moderate right-sided hydrocele. Varicocele:  Small right-sided varicocele. Diffuse scrotal edema noted. Pulsed Doppler interrogation of both testes demonstrates normal low resistance arterial and venous waveforms bilaterally. IMPRESSION: 1. Enlarged and hypervascular right epididymis, suggesting possible acute epididymitis. 2. Moderate right-sided hydrocele, likely reactive. 3. Diffuse scrotal edema. 4. Small right-sided varicocele. 5. No evidence for testicular torsion. Electronically Signed   By: Jeannine Boga M.D.   On: 04/05/2017 01:27   Dg Chest Port 1 View  Result Date: 04/04/2017 CLINICAL DATA:  Fever.  Pneumonia.  Impella. EXAM: PORTABLE CHEST 1 VIEW COMPARISON:  04/03/2017 and previous FINDINGS: Chronic cardiomegaly. Impella appears unchanged. Venous catheter inserted from a femoral approach has its tip in the right middle or lower lobe pulmonary artery. Mild interstitial edema is similar  to yesterday. No worsening or new finding. IMPRESSION: No change. Mild interstitial edema. Impella and pulmonary arterial catheter appear unchanged. Electronically Signed   By: Nelson Chimes M.D.   On: 04/04/2017 08:55   [place] Scrotom Doppler  Result Date: 04/05/2017 CLINICAL DATA:  Initial evaluation for acute swelling for 4 days. EXAM: SCROTAL ULTRASOUND DOPPLER ULTRASOUND OF THE TESTICLES TECHNIQUE: Complete ultrasound examination of the testicles, epididymis, and other scrotal structures was performed. Color and spectral Doppler ultrasound were also utilized to evaluate blood flow to the testicles. COMPARISON:  Prior CT from 11/23/2014. FINDINGS: Right testicle Measurements: 4.0 x 2.7 x 2.7 cm. 4 mm complex cystic lesion at the level of the rete testes, likely focal tubular ectasia of or possibly a small spermatocele. No associated vascularity or other concerning features. This is of doubtful significance. No other mass lesion. No microlithiasis. Left testicle Measurements: 4.3 x 3.5 x 3.3 cm. No mass or microlithiasis visualized. Right epididymis: Right epididymis is somewhat enlarged and hypervascular as compared to the left, which may reflect sequelae of acute epididymitis. Left epididymis:  Normal in size and appearance. Hydrocele:  Moderate right-sided hydrocele. Varicocele:  Small right-sided varicocele. Diffuse scrotal edema noted. Pulsed Doppler interrogation of both testes demonstrates normal low resistance arterial and venous waveforms bilaterally. IMPRESSION: 1. Enlarged and hypervascular right epididymis, suggesting possible acute epididymitis. 2. Moderate right-sided hydrocele, likely reactive. 3. Diffuse scrotal edema. 4. Small right-sided varicocele. 5. No evidence for testicular torsion. Electronically Signed   By: Jeannine Boga M.D.   On: 04/05/2017 01:27     Medications:     Scheduled Medications: . aspirin EC  81 mg Oral Daily  . atorvastatin  80 mg Oral QHS  . Chlorhexidine  Gluconate Cloth  6 each Topical q morning - 10a  . digoxin  0.125 mg Oral Daily  . enalapril  2.5 mg Oral BID  . insulin aspart  0-20 Units Subcutaneous TID WC  . mouth rinse  15 mL Mouth Rinse BID  . sodium chloride flush  10-40 mL Intracatheter Q12H  . sodium chloride flush  3 mL Intravenous Q12H  . spironolactone  12.5 mg Oral Daily    Infusions: . sodium chloride 250 mL (04/05/17 0428)  . sodium chloride 10 mL/hr at 04/05/17 0428  . impella catheter heparin 50 unit/mL in dextrose 5%    . heparin 350 Units/hr (04/04/17 1953)  . norepinephrine (LEVOPHED) Adult infusion Stopped (04/02/17 0845)  . piperacillin-tazobactam (ZOSYN)  IV 3.375 g (04/05/17 0512)  . vancomycin Stopped (04/05/17 0128)    PRN Medications: sodium chloride, acetaminophen, hydrALAZINE, morphine injection, nitroGLYCERIN, ondansetron (ZOFRAN) IV, oxyCODONE, sodium chloride flush, sodium chloride flush   Patient Profile   71 y/o with anterior STEMI who developed cardiogenic shock. 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 Impella placed for stabilization of shock.   Assessment/Plan   1. Cardiogenic shock - Impella placed overnight 12/2 into 12/3. Output improved.  - Impella P2, Flow 2.0. Waveform OK. Had planned on removing 04/04/17 but postponed. Likely will remove today.  - LDH 1293 -> 1161 -> 904 -> 768 - BP stable.   2. ID -> Sepsis unclear source.  - Tmax 101.5. Remains on Vanc/Zosyn. In 99-100 range this am.  - CCM following. Appreciate their support.  - levophed as needed - UCx with pseudomonas.  - BCx NGDT.  - Sputum culture 04/03/17 with rare gram+ cocci.  - With expiratory wheeze will add duonebs prn. Likely component of COPD. Further per CCM  3. Acute systolic CHF in setting of STEMI: Echo with EF 15-20%.  - Ischemic CMP. Will need CABG once recovered from sepsis. Likely early week of 04/08/17. - Volume status mildly elevated. Received  IV lasix this am. Follow response.  - Leaving CVP in 7-9 range with impella in place.  - No BB for now.  - Continue digoxin 0.125 mg daily - Continue spiro 12.5 mg daily.  - Continue enalapril 2.5 mg BID  4. Severe CAD - Dr Cyndia Bent has seen. Plan CABG pending resolution of fever and leukocytosis. Likely early next week.  - No CP at this time.  - Hold BB with shock.  5. DM2 - Continue SSI. No change.   6. Hypomagnesemia - Stable today.    7. Tobacco abuse - Smoked 1 ppd PTA for > 50 years.  - Will continue to encourage complete cessation. No change.   8. HTN - Meds as above. Will treat in the setting of managing his HF. No change.   9. Hx of bladder CA - Urologist has told him he may have intermittent hematuria,  but UOP amber on arrival. So ? Hemolysis.  - Pt states he had multiple rounds of Bacillus Calmette-Guerin (BCG) therapy. No change.   10. Hypokalemia - K 3.6 this am.   - Continue spiro.   11. Scrotal swelling - [place] 04/04/17 with R hydrocele and ? Epididymitis. - Continue to follow fluid status.  On broad spectrum ABX already.   Patient is critically ill and remains in imminent danger of multiple organ failure.    Medication concerns reviewed with patient and pharmacy team. Barriers identified: None at this time.   Length of Stay: [place], [place]  04/05/2017, 8:14 AM  Advanced Heart Failure Team Pager [phone] (M-F; 7a - 4p)  Please contact [facility] for night-coverage after hours (4p -7a ) and weekends on [email or web address]  Agree with above.  Remains on Impella at P-2 flow 2.0. He is more SOB today. Wheezing on exam. Luiz Blare numbers (done personally). Cardiac output good but filling pressures are high. Fevers are improving but still febrile at times. Getting nebs and steroids from CCM. No CP. Having hard time lying flat. No RLE pain.   On exam JVP up Cor tachy reg Impella hum Lungs diffuse wheezing worse on R Ab soft NT/ND Ext warm RF  impella and swan site ok. R DP trace-1+ Neuro: alert & oriented x 3, cranial nerves grossly intact. moves all 4 extremities w/o difficulty. Affect pleasant  Remains very tenuous. Respiratory status worse today. Impella parameters ok on P-2. Swan numbers with good output but elevated filling pressures. Will diurese today and try to pull Impella later today. CCM treating for COPD flare. (D/w them at bedside). Plan CABG possibly Monday if fevers improved.   CRITICAL CARE Performed by: Glori Bickers  Total critical care time: 35 minutes  Critical care time was exclusive of separately billable procedures and treating other patients.  Critical care was necessary to treat or prevent imminent or life-threatening deterioration.  Critical care was time spent personally by me (independent of midlevel providers or residents) on the following activities: development of treatment plan with patient and/or surrogate as well as nursing, discussions with consultants, evaluation of patient's response to treatment, examination of patient, obtaining history from patient or surrogate, ordering and performing treatments and interventions, ordering and review of laboratory studies, ordering and review of radiographic studies, pulse oximetry and re-evaluation of patient's condition.  Glori Bickers, MD  6:06 PM

## 2017-04-05 NOTE — Progress Notes (Signed)
Hochrein, MD paged again due to patient continued complaint of shortness of breath. Hochrein, MD came to bedside to assess patient. Verbal order to give patient 40 of Lasix was given. Will initiate orders and continue to monitor patient.

## 2017-04-05 NOTE — Progress Notes (Addendum)
PULMONARY / CRITICAL CARE MEDICINE   Name: [name] [name] MRN: [id number] DOB: January 05, 1946    ADMISSION DATE:  03/31/2017 CONSULTATION DATE:  04/02/2017  REFERRING MD: Oda Kilts, PA  CHIEF COMPLAINT: Sepsis  HISTORY OF PRESENT ILLNESS:   71 y/o male with CAD.  He was admitted on 12/2 with dyspnea, STEMI now has cardiogenic shock.  Smoked 1 ppd for 55 years.  PCCM consulted for fever, sepsis on 12/4, possible HCAP.   SUBJECTIVE:  Feels more short of breath today Wheezing overnight Albuterol-ipratropium helps  VITAL SIGNS: BP 127/80   Pulse 100   Temp 99.9 F (37.7 C)   Resp 18   Ht 6\' 2"  (1.88 m)   Wt 223 lb 12.3 oz (101.5 kg)   SpO2 96%   BMI 28.73 kg/m   HEMODYNAMICS: PAP: (34-44)/(14-29) 40/23 CVP:  [4 mmHg-12 mmHg] 9 mmHg PCWP:  [9 mmHg-13 mmHg] 9 mmHg CO:  [7.1 L/min-9.9 L/min] 7.1 L/min CI:  [3.2 L/min/m2-4.5 L/min/m2] 3.2 L/min/m2   INTAKE / OUTPUT: I/O last 3 completed shifts: In: 2940.4 [P.O.:600; I.V.:796.4; YBOFB:510; IV Piggyback:1000] Out: 2400 [Urine:2400]  PHYSICAL EXAMINATION:  General:  Resting comfortably in bed HENT: NCAT OP clear PULM: Wheezing, poor air movement, some upper airway wheeze, normal effort CV: RRR, no mgr GI: BS+, soft, nontender MSK: normal bulk and tone, impella in place R leg Neuro: awake, alert, no distress, MAEW   LABS:  BMET Recent Labs  Lab 04/03/17 0358 04/04/17 0400 04/05/17 0352  NA 129* 128* 129*  K 3.5 3.4* 3.6  CL 99* 97* 97*  CO2 19* 20* 21*  BUN 22* 22* 17  CREATININE 1.05 1.16 1.11  GLUCOSE 123* 98 125*    Electrolytes Recent Labs  Lab 04/03/17 0358 04/04/17 0400 04/05/17 0352  CALCIUM 7.5* 7.5* 7.6*  MG 2.2 2.1 2.2    CBC Recent Labs  Lab 04/03/17 0358 04/04/17 0400 04/05/17 0352  WBC 35.3* 33.2* 28.6*  HGB 10.4* 10.4* 10.5*  HCT 31.1* 31.1* 31.6*  PLT 175 183 199    Coag's Recent Labs  Lab 03/31/17 1810  APTT 31  INR 1.13    Sepsis Markers Recent Labs  Lab  03/31/17 2341 04/01/17 0235 04/01/17 1206 04/02/17 0321 04/02/17 0827 04/03/17 0358  LATICACIDVEN 1.4 1.6  --   --  1.4  --   PROCALCITON  --   --  0.42 6.43  --  5.15    ABG Recent Labs  Lab 04/03/17 0410 04/04/17 0414 04/05/17 0428  PHART 7.445 7.476* 7.445  PCO2ART 29.7* 29.5* 31.0*  PO2ART 108 59.2* 66.8*    Liver Enzymes Recent Labs  Lab 03/31/17 1810  AST 58*  ALT 27  ALKPHOS 86  BILITOT 1.3*  ALBUMIN 3.2*    Cardiac Enzymes Recent Labs  Lab 03/31/17 1810 03/31/17 2341  TROPONINI 4.20* 53.99*    Glucose Recent Labs  Lab 04/03/17 2136 04/04/17 0825 04/04/17 1159 04/04/17 1555 04/04/17 2155 04/05/17 0729  GLUCAP 103* 128* 118* 139* 111* 123*    Imaging US Scrotum  Result Date: 04/05/2017 CLINICAL DATA:  Initial evaluation for acute swelling for 4 days. EXAM: SCROTAL ULTRASOUND DOPPLER ULTRASOUND OF THE TESTICLES TECHNIQUE: Complete ultrasound examination of the testicles, epididymis, and other scrotal structures was performed. Color and spectral Doppler ultrasound were also utilized to evaluate blood flow to the testicles. COMPARISON:  Prior CT from 11/23/2014. FINDINGS: Right testicle Measurements: 4.0 x 2.7 x 2.7 cm. 4 mm complex cystic lesion at the level of the rete testes,  likely focal tubular ectasia of or possibly a small spermatocele. No associated vascularity or other concerning features. This is of doubtful significance. No other mass lesion. No microlithiasis. Left testicle Measurements: 4.3 x 3.5 x 3.3 cm. No mass or microlithiasis visualized. Right epididymis: Right epididymis is somewhat enlarged and hypervascular as compared to the left, which may reflect sequelae of acute epididymitis. Left epididymis:  Normal in size and appearance. Hydrocele:  Moderate right-sided hydrocele. Varicocele:  Small right-sided varicocele. Diffuse scrotal edema noted. Pulsed Doppler interrogation of both testes demonstrates normal low resistance arterial and  venous waveforms bilaterally. IMPRESSION: 1. Enlarged and hypervascular right epididymis, suggesting possible acute epididymitis. 2. Moderate right-sided hydrocele, likely reactive. 3. Diffuse scrotal edema. 4. Small right-sided varicocele. 5. No evidence for testicular torsion. Electronically Signed   By: Jeannine Boga M.D.   On: 04/05/2017 01:27   Dg Chest Port 1 View  Result Date: 04/04/2017 CLINICAL DATA:  Fever.  Pneumonia.  Impella. EXAM: PORTABLE CHEST 1 VIEW COMPARISON:  04/03/2017 and previous FINDINGS: Chronic cardiomegaly. Impella appears unchanged. Venous catheter inserted from a femoral approach has its tip in the right middle or lower lobe pulmonary artery. Mild interstitial edema is similar to yesterday. No worsening or new finding. IMPRESSION: No change. Mild interstitial edema. Impella and pulmonary arterial catheter appear unchanged. Electronically Signed   By: Nelson Chimes M.D.   On: 04/04/2017 08:55   [place] Scrotom Doppler  Result Date: 04/05/2017 CLINICAL DATA:  Initial evaluation for acute swelling for 4 days. EXAM: SCROTAL ULTRASOUND DOPPLER ULTRASOUND OF THE TESTICLES TECHNIQUE: Complete ultrasound examination of the testicles, epididymis, and other scrotal structures was performed. Color and spectral Doppler ultrasound were also utilized to evaluate blood flow to the testicles. COMPARISON:  Prior CT from 11/23/2014. FINDINGS: Right testicle Measurements: 4.0 x 2.7 x 2.7 cm. 4 mm complex cystic lesion at the level of the rete testes, likely focal tubular ectasia of or possibly a small spermatocele. No associated vascularity or other concerning features. This is of doubtful significance. No other mass lesion. No microlithiasis. Left testicle Measurements: 4.3 x 3.5 x 3.3 cm. No mass or microlithiasis visualized. Right epididymis: Right epididymis is somewhat enlarged and hypervascular as compared to the left, which may reflect sequelae of acute epididymitis. Left epididymis:   Normal in size and appearance. Hydrocele:  Moderate right-sided hydrocele. Varicocele:  Small right-sided varicocele. Diffuse scrotal edema noted. Pulsed Doppler interrogation of both testes demonstrates normal low resistance arterial and venous waveforms bilaterally. IMPRESSION: 1. Enlarged and hypervascular right epididymis, suggesting possible acute epididymitis. 2. Moderate right-sided hydrocele, likely reactive. 3. Diffuse scrotal edema. 4. Small right-sided varicocele. 5. No evidence for testicular torsion. Electronically Signed   By: Jeannine Boga M.D.   On: 04/05/2017 01:27     STUDIES:  04/02/2017 CXR:  Bilateral interstitial disease, left greater than right, worsening in the right upper lobe. This could reflect worsening edema or Infection.  12/3 Echo: EF 15-20%, with diffuse hypokinesis, Grade I diastolic Dysfunction, Aortic Valve moderate regurg   [facility] 03/31/17  Prox RCA lesion is 100% stenosed. Dist RCA lesion is 100% stenosed (retrograde filling from collaterals does not go beyond the distal vessel) -->faint collateral filling to the distal PL and PDA from septal perforators and a diffusely diseased AV groove circumflex  LAV Groove lesion is 90% stenosed.  Prox LAD-1 lesion is 95% stenosed. Prox LAD-2 lesion is 85% stenosed. Heavily calcified, tandem lesions in very tortuous segment of the vessel.  Prox Cx to Mid Cx lesion  is 70% stenosed. Heavily calcified  There is severe left ventricular systolic dysfunction. The left ventricular ejection fraction is less than 25% by visual estimate.   RHC Procedural Findings: PAP/mean: 45/27/37 mmHg PCWP: 32-35 mmHg Elevated LV EDP consistent with volume overload.  LVP/EDP: 117/21/37 mmHg  AO sat 89% PA sat 52%. Cardiac output/index (Fick): 4.09, 1.86. 12/6 Scrotal ultrasound> possible R epididymitis 12/4 bedside spirometry> severe airflow obstruction  CULTURES: 12/2>> Viral Panel>> Negative 12/3>> Blood>> 12/3>>  Urine>>pseudomonas>> pan sens  12/4>> Respiratory Culture>> 12/5 sputum >>> mod GPC, many different species seen on gram stain   ANTIBIOTICS: 04/01/2017 Vanc >> 04/01/2017 Zosyn >>  SIGNIFICANT EVENTS: 12/2/201: Admit with STEMI>> cardiogenic shock 12/2 Cath/ Impella Placement  12/3 : Temp spike to 104.2>> ? Sepsis, PCT 6, WBC 38, LA 1.4  LINES/TUBES: 12/2: Right Femoral Swan   DISCUSSION: Pt presents with STEMI after what patient thought was viral illness. Cath with severe 3Vd.Impella placed for cardiogenic shock. Developed fever of 104  on 12/3 and WBC spike to 38.8 on 12/4with concern for sepsis after fever spikes. SVR dropped from 11 on 12/3 to 5 on 12/4. No obvious source, but suspect pneumonia. Lactic acid 1.4, PCT: 6. LDH 1161. Cardiac Index 3.86. Currently protecting airway. No pressors or inotropes needed.CCM have been asked to assist with management of suspected sepsis.   ASSESSMENT / PLAN:  PULMONARY A: Acute Respiratory failure with hypoxemia >> 6 L [place] Severe COPD by spirometry (full PFT will be needed at somepoint) Current Smoker 1 ppd PTA RUL infiltrate> HCAP COPD exacerbation Vocal cord dysfunction Acute pulmonary edema P:   Add solumedrol Add brovana Add pulmicort CXR now Lasix now Treat GERD, post nasal drip Counsel to quit smoking   CARDIOVASCULAR A:  S/P STEMI Acute Systolic HF Severe CAD>> TCTS evaluating for CABG once  fever resolved Cardiogenic Shock with Impella Device Assist>> no pressors or  inotropes at present  P: Tele Care per TCTS/Cardiology   RENAL A:  Hyponatremia P:   Monitor BMET and UOP Replace electrolytes as needed   GASTROINTESTINAL A:   Likely GERD P:   Start PPI Cardiac diet  HEMATOLOGIC A:   Anemia with out bleeding Impella P:  Monitor for bleeding Heparin per Cardiology   INFECTIOUS A:  HCAP UTI Epidydimitis P:   Continue vanc/zosyn for now F/u cultures   ENDOCRINE A:   DM II   P:    Monitor glucose  NEUROLOGIC A:   Anxiety P:   Add ativan prn   FAMILY  - Updates: Patient and wife updated on 12/6  - Inter-disciplinary family meet or Palliative Care meeting due by:  12/9/ 2018  My cc time 35 minutes  Roselie Awkward, MD [place] P[facility]M Pager: [phone] Cell: [phone] After 3pm or if no response, call [phone]

## 2017-04-06 ENCOUNTER — Inpatient Hospital Stay (HOSPITAL_COMMUNITY): Payer: Medicare Other

## 2017-04-06 LAB — CULTURE, BLOOD (ROUTINE X 2)
Culture: NO GROWTH
Culture: NO GROWTH
SPECIAL REQUESTS: ADEQUATE
Special Requests: ADEQUATE

## 2017-04-06 LAB — CBC WITH DIFFERENTIAL/PLATELET
BASOS ABS: 0 10*3/uL (ref 0.0–0.1)
Basophils Relative: 0 %
EOS ABS: 0 10*3/uL (ref 0.0–0.7)
Eosinophils Relative: 0 %
HCT: 31.4 % — ABNORMAL LOW (ref 39.0–52.0)
Hemoglobin: 10.7 g/dL — ABNORMAL LOW (ref 13.0–17.0)
LYMPHS ABS: 1.1 10*3/uL (ref 0.7–4.0)
Lymphocytes Relative: 4 %
MCH: 29 pg (ref 26.0–34.0)
MCHC: 34.1 g/dL (ref 30.0–36.0)
MCV: 85.1 fL (ref 78.0–100.0)
MONO ABS: 0.8 10*3/uL (ref 0.1–1.0)
Monocytes Relative: 3 %
NEUTROS ABS: 25 10*3/uL — AB (ref 1.7–7.7)
Neutrophils Relative %: 93 %
PLATELETS: 231 10*3/uL (ref 150–400)
RBC: 3.69 MIL/uL — ABNORMAL LOW (ref 4.22–5.81)
RDW: 14.1 % (ref 11.5–15.5)
WBC: 26.9 10*3/uL — ABNORMAL HIGH (ref 4.0–10.5)

## 2017-04-06 LAB — LACTATE DEHYDROGENASE: LDH: 695 U/L — ABNORMAL HIGH (ref 98–192)

## 2017-04-06 LAB — POCT I-STAT 3, ART BLOOD GAS (G3+)
Acid-base deficit: 1 mmol/L (ref 0.0–2.0)
Bicarbonate: 22 mmol/L (ref 20.0–28.0)
O2 Saturation: 94 %
PCO2 ART: 31.7 mmHg — AB (ref 32.0–48.0)
Patient temperature: 36.6
TCO2: 23 mmol/L (ref 22–32)
pH, Arterial: 7.448 (ref 7.350–7.450)
pO2, Arterial: 66 mmHg — ABNORMAL LOW (ref 83.0–108.0)

## 2017-04-06 LAB — CULTURE, RESPIRATORY W GRAM STAIN: Culture: NORMAL

## 2017-04-06 LAB — GLUCOSE, CAPILLARY
GLUCOSE-CAPILLARY: 231 mg/dL — AB (ref 65–99)
Glucose-Capillary: 202 mg/dL — ABNORMAL HIGH (ref 65–99)
Glucose-Capillary: 270 mg/dL — ABNORMAL HIGH (ref 65–99)
Glucose-Capillary: 247 mg/dL — ABNORMAL HIGH (ref 65–99)

## 2017-04-06 LAB — BASIC METABOLIC PANEL
Anion gap: 12 (ref 5–15)
BUN: 21 mg/dL — ABNORMAL HIGH (ref 6–20)
CALCIUM: 7.8 mg/dL — AB (ref 8.9–10.3)
CHLORIDE: 95 mmol/L — AB (ref 101–111)
CO2: 23 mmol/L (ref 22–32)
CREATININE: 1.13 mg/dL (ref 0.61–1.24)
GFR calc Af Amer: >60 mL/min (ref >60)
GFR calc non Af Amer: >60 mL/min (ref >60)
GLUCOSE: 204 mg/dL — AB (ref 65–99)
Potassium: 3.8 mmol/L (ref 3.5–5.1)
Sodium: 130 mmol/L — ABNORMAL LOW (ref 135–145)

## 2017-04-06 LAB — CALCIUM, IONIZED: CALCIUM, IONIZED, SERUM: 4.2 mg/dL — AB (ref 4.5–5.6)

## 2017-04-06 LAB — COOXEMETRY PANEL
Carboxyhemoglobin: 1.1 % (ref 0.5–1.5)
METHEMOGLOBIN: 1 % (ref 0.0–1.5)
O2 Saturation: 81.8 %
Total hemoglobin: 10.8 g/dL — ABNORMAL LOW (ref 12.0–16.0)

## 2017-04-06 LAB — CULTURE, RESPIRATORY: CULTURE: NORMAL

## 2017-04-06 LAB — MAGNESIUM: MAGNESIUM: 2.5 mg/dL — AB (ref 1.7–2.4)

## 2017-04-06 MED ORDER — PANTOPRAZOLE SODIUM 40 MG PO TBEC
40.0000 mg | DELAYED_RELEASE_TABLET | Freq: Every day | ORAL | Status: DC
  Administered 2017-04-06 – 2017-04-07 (×2): 40 mg via ORAL
  Filled 2017-04-06 (×2): qty 1

## 2017-04-06 MED ORDER — CHLORHEXIDINE GLUCONATE CLOTH 2 % EX PADS
6.0000 | MEDICATED_PAD | Freq: Every day | CUTANEOUS | Status: DC
  Administered 2017-04-06 – 2017-04-07 (×2): 6 via TOPICAL

## 2017-04-06 MED ORDER — PIPERACILLIN-TAZOBACTAM 3.375 G IVPB
3.3750 g | Freq: Three times a day (TID) | INTRAVENOUS | Status: DC
  Administered 2017-04-07 – 2017-04-16 (×27): 3.375 g via INTRAVENOUS
  Filled 2017-04-06 (×28): qty 50

## 2017-04-06 MED ORDER — SODIUM CHLORIDE 0.9% FLUSH: 10.0000 mL | INTRAVENOUS | Status: DC | PRN

## 2017-04-06 MED ORDER — SODIUM CHLORIDE 0.9% FLUSH
10.0000 mL | Freq: Two times a day (BID) | INTRAVENOUS | Status: DC
  Administered 2017-04-06: 10 mL

## 2017-04-06 MED ORDER — POTASSIUM CHLORIDE CRYS ER 20 MEQ PO TBCR
40.0000 meq | EXTENDED_RELEASE_TABLET | Freq: Once | ORAL | Status: AC
  Administered 2017-04-06: 40 meq via ORAL
  Filled 2017-04-06: qty 2

## 2017-04-06 MED ORDER — FUROSEMIDE 10 MG/ML IJ SOLN
80.0000 mg | Freq: Two times a day (BID) | INTRAMUSCULAR | Status: DC
  Administered 2017-04-06 – 2017-04-07 (×4): 80 mg via INTRAVENOUS
  Filled 2017-04-06 (×4): qty 8

## 2017-04-06 MED ORDER — ENALAPRIL MALEATE 5 MG PO TABS
5.0000 mg | ORAL_TABLET | Freq: Two times a day (BID) | ORAL | Status: DC
  Administered 2017-04-06 – 2017-04-07 (×4): 5 mg via ORAL
  Filled 2017-04-06 (×6): qty 1

## 2017-04-06 NOTE — Progress Notes (Signed)
Pharmacy Antibiotic Note  [name] [name] is a 71 y.o. male admitted on 03/31/2017 with fever, flu-like symptoms.  Pharmacy has been consulted for vancomycin and zosyn dosing.  Respiratory virus panel negative.  Likely RUL PNA.  Afebrile today.  WBC still elevated but trending down slowly.  20K/ml pseudomonas in urine.  Other cultures remain negative.  Pan-sensitive, should be adequately treated by Zosyn.  Plan: 1. Continue vancomycin 1250 mg IV q 12 hrs.  Can likely d/c soon? 2. Zosyn 3.375g IV q 8 hrs (extended-interval infusion). 3. F/u renal function, cultures and clinical course.  Height: 6\' 2"  (188 cm) Weight: 209 lb 14.1 oz (95.2 kg) IBW/kg (Calculated) : 82.2  Temp (24hrs), Avg:98.6 F (37 C), Min:97.7 F (36.5 C), Max:99.7 F (37.6 C)  Recent Labs  Lab 03/31/17 2341 04/01/17 0235 04/02/17 0321 04/02/17 0827 04/03/17 0358 04/03/17 1157 04/04/17 0400 04/05/17 0352 04/06/17 0323  WBC  --  22.3* 38.8*  --  35.3*  --  33.2* 28.6* 26.9*  CREATININE  --  1.27* 1.27*  --  1.05  --  1.16 1.11 1.13  LATICACIDVEN 1.4 1.6  --  1.4  --   --   --   --   --   VANCOTROUGH  --   --   --   --   --  16  --   --   --     Estimated Creatinine Clearance: 69.7 mL/min (by C-G formula based on SCr of 1.13 mg/dL).    No Known Allergies  Antimicrobials this admission:  12/3 Vanc > 12/3 Zosyn >   Dose adjustments this admission:  12/5 VT = 16 on 1250 q 12 hrs - con't same  Microbiology results:  12/4 Resp Cx: reincubated 12/3 BCx x 2: ngtd 12/3 UCx: 20,000/ml pseudomonas - pan-S 12/2 MRSA PCR: negative 12/2 Resp virus panel - neg Thank you for allowing pharmacy to be a part of this patient's care.  [name], BCPS  Clinical Pharmacist Pager [phone]  04/06/2017 9:42 AM

## 2017-04-06 NOTE — Progress Notes (Signed)
Peripherally Inserted Central Catheter/Midline Placement  The IV Nurse has discussed with the patient and/or persons authorized to consent for the patient, the purpose of this procedure and the potential benefits and risks involved with this procedure.  The benefits include less needle sticks, lab draws from the catheter, and the patient may be discharged home with the catheter. Risks include, but not limited to, infection, bleeding, blood clot (thrombus formation), and puncture of an artery; nerve damage and irregular heartbeat and possibility to perform a PICC exchange if needed/ordered by physician.  Alternatives to this procedure were also discussed.  Bard Power PICC patient education guide, fact sheet on infection prevention and patient information card has been provided to patient /or left at bedside.    PICC/Midline Placement Documentation  PICC Triple Lumen 04/06/17 PICC Right Brachial 41 cm 0 cm (Active)  Indication for Insertion or Continuance of Line Chronic illness with exacerbations (CF, Sickle Cell, etc.);Prolonged intravenous therapies;Vasoactive infusions 04/06/2017  1:46 PM  Exposed Catheter (cm) 0 cm 04/06/2017  1:46 PM  Site Assessment Clean;Dry;Intact 04/06/2017  1:46 PM  Lumen #1 Status Flushed;Saline locked;Blood return noted 04/06/2017  1:46 PM  Lumen #2 Status Flushed;Saline locked;Blood return noted 04/06/2017  1:46 PM  Lumen #3 Status Flushed;Saline locked;Blood return noted 04/06/2017  1:46 PM  Dressing Type Transparent 04/06/2017  1:46 PM  Dressing Status Clean;Dry;Intact;Antimicrobial disc in place 04/06/2017  1:46 PM  Line Care Connections checked and tightened 04/06/2017  1:46 PM  Line Adjustment (NICU/IV Team Only) No 04/06/2017  1:46 PM  Dressing Intervention New dressing 04/06/2017  1:46 PM  Dressing Change Due 04/13/17 04/06/2017  1:46 PM       Rolena Infante 04/06/2017, 1:47 PM

## 2017-04-06 NOTE — Progress Notes (Signed)
PULMONARY / CRITICAL CARE MEDICINE   Name: [name] [name] MRN: [id number] DOB: 1945/12/18    ADMISSION DATE:  03/31/2017 CONSULTATION DATE:  04/02/2017  REFERRING MD: Oda Kilts, PA  CHIEF COMPLAINT: Sepsis  HISTORY OF PRESENT ILLNESS:   71 y/o male with CAD.  He was admitted on 12/2 with dyspnea, STEMI now has cardiogenic shock.  Smoked 1 ppd for 55 years.  PCCM consulted for fever, sepsis on 12/4, possible HCAP.   SUBJECTIVE:  No acute distress reports breathing better.    VITAL SIGNS: BP 122/71   Pulse 100   Temp 98.2 F (36.8 C)   Resp 13   Ht 6\' 2"  (1.88 m)   Wt 209 lb 14.1 oz (95.2 kg)   SpO2 96%   BMI 26.95 kg/m   HEMODYNAMICS: PAP: (36-66)/(19-49) 39/23 CVP:  [2 mmHg-13 mmHg] 6 mmHg PCWP:  [29 mmHg] 29 mmHg CO:  [6.6 L/min] 6.6 L/min CI:  [3 L/min/m2] 3 L/min/m2   INTAKE / OUTPUT: I/O last 3 completed shifts: In: 2763.8 [P.O.:360; I.V.:798; Other:345.8; IV Piggyback:1260] Out: 6250 [Urine:6250]  PHYSICAL EXAMINATION:  General: Well-nourished well-developed male no acute distress on room air HEENT: No JVD appreciated PSY: Normal effect Neuro: Intact CV: s1s2 rrr, no m/r/g PULM: even/non-labored, lungs bilaterally decreased breath sounds in the bases SW:NIOE, non-tender, bsx4 active  Extremities: warm/dry, 1+ edema  Skin: no rashes or lesions    LABS:  BMET Recent Labs  Lab 04/04/17 0400 04/05/17 0352 04/06/17 0323  NA 128* 129* 130*  K 3.4* 3.6 3.8  CL 97* 97* 95*  CO2 20* 21* 23  BUN 22* 17 21*  CREATININE 1.16 1.11 1.13  GLUCOSE 98 125* 204*    Electrolytes Recent Labs  Lab 04/04/17 0400 04/05/17 0352 04/06/17 0323  CALCIUM 7.5* 7.6* 7.8*  MG 2.1 2.2 2.5*    CBC Recent Labs  Lab 04/04/17 0400 04/05/17 0352 04/06/17 0323  WBC 33.2* 28.6* 26.9*  HGB 10.4* 10.5* 10.7*  HCT 31.1* 31.6* 31.4*  PLT 183 199 231    Coag's Recent Labs  Lab 03/31/17 1810  APTT 31  INR 1.13    Sepsis Markers Recent Labs  Lab  03/31/17 2341 04/01/17 0235 04/01/17 1206 04/02/17 0321 04/02/17 0827 04/03/17 0358  LATICACIDVEN 1.4 1.6  --   --  1.4  --   PROCALCITON  --   --  0.42 6.43  --  5.15    ABG Recent Labs  Lab 04/04/17 0414 04/05/17 0428 04/06/17 0355  PHART 7.476* 7.445 7.448  PCO2ART 29.5* 31.0* 31.7*  PO2ART 59.2* 66.8* 66.0*    Liver Enzymes Recent Labs  Lab 03/31/17 1810  AST 58*  ALT 27  ALKPHOS 86  BILITOT 1.3*  ALBUMIN 3.2*    Cardiac Enzymes Recent Labs  Lab 03/31/17 1810 03/31/17 2341  TROPONINI 4.20* 53.99*    Glucose Recent Labs  Lab 04/04/17 1555 04/04/17 2155 04/05/17 0729 04/05/17 1208 04/05/17 1613 04/06/17 0807  GLUCAP 139* 111* 123* 132* 194* 202*    Imaging No results found.   STUDIES:  04/02/2017 CXR:  Bilateral interstitial disease, left greater than right, worsening in the right upper lobe. This could reflect worsening edema or Infection.  12/3 Echo: EF 15-20%, with diffuse hypokinesis, Grade I diastolic Dysfunction, Aortic Valve moderate regurg   [facility] 03/31/17  Prox RCA lesion is 100% stenosed. Dist RCA lesion is 100% stenosed (retrograde filling from collaterals does not go beyond the distal vessel) -->faint collateral filling to the distal PL and  PDA from septal perforators and a diffusely diseased AV groove circumflex  LAV Groove lesion is 90% stenosed.  Prox LAD-1 lesion is 95% stenosed. Prox LAD-2 lesion is 85% stenosed. Heavily calcified, tandem lesions in very tortuous segment of the vessel.  Prox Cx to Mid Cx lesion is 70% stenosed. Heavily calcified  There is severe left ventricular systolic dysfunction. The left ventricular ejection fraction is less than 25% by visual estimate.   RHC Procedural Findings: PAP/mean: 45/27/37 mmHg PCWP: 32-35 mmHg Elevated LV EDP consistent with volume overload.  LVP/EDP: 117/21/37 mmHg  AO sat 89% PA sat 52%. Cardiac output/index (Fick): 4.09, 1.86. 12/6 Scrotal ultrasound> possible R  epididymitis 12/4 bedside spirometry> severe airflow obstruction  CULTURES: 12/2>> Viral Panel>> Negative 12/3>> Blood>> 12/3>> Urine>>pseudomonas>> pan sens  12/4>> Respiratory Culture>> 12/5 sputum >>> mod GPC, many different species seen on gram stain   ANTIBIOTICS: 04/01/2017 Vanc >> 04/01/2017 Zosyn >>  SIGNIFICANT EVENTS: 12/2/201: Admit with STEMI>> cardiogenic shock 12/2 Cath/ Impella Placement  12/3 : Temp spike to 104.2>> ? Sepsis, PCT 6, WBC 38, LA 1.4  LINES/TUBES: 12/2: Right Femoral Swan >>  DISCUSSION: Pt presents with STEMI after what patient thought was viral illness. Cath with severe 3Vd.Impella placed for cardiogenic shock. Developed fever of 104  on 12/3 and WBC spike to 38.8 on 12/4with concern for sepsis after fever spikes. SVR dropped from 11 on 12/3 to 5 on 12/4. No obvious source, but suspect pneumonia. Lactic acid 1.4, PCT: 6. LDH 1161. Cardiac Index 3.86. Currently protecting airway. No pressors or inotropes needed.CCM have been asked to assist with management of suspected sepsis. 04/06/2017 awake alert eating.  No acute distress on room air  ASSESSMENT / PLAN:  PULMONARY A: Acute Respiratory failure with hypoxemia >> 6 L [place] Severe COPD by spirometry (full PFT will be needed at somepoint) Current Smoker 1 ppd PTA RUL infiltrate> HCAP COPD exacerbation Vocal cord dysfunction Acute pulmonary edema P:   Added solumedrol on 04/05/2017 Added brovana Added pulmicort CXR repeat Lasix now Treat GERD, post nasal drip Counsel to quit smoking   CARDIOVASCULAR A:  S/P STEMI Acute Systolic HF Severe CAD>> TCTS evaluating for CABG once  fever resolved Cardiogenic Shock with Impella Device Assist>> no pressors or  inotropes at present  P: Tele Care per TCTS/Cardiology   RENAL Lab Results  Component Value Date   CREATININE 1.13 04/06/2017   CREATININE 1.11 04/05/2017   CREATININE 1.16 04/04/2017   Recent Labs  Lab 04/04/17 0400  04/05/17 0352 04/06/17 0323  NA 128* 129* 130*   Recent Labs  Lab 04/04/17 0400 04/05/17 0352 04/06/17 0323  K 3.4* 3.6 3.8     A:  Hyponatremia slowly resolving P:   Monitor BMET and UOP Replace electrolytes as needed   GASTROINTESTINAL A:   Likely GERD P:   Start PPI Cardiac diet  HEMATOLOGIC Recent Labs    04/05/17 0352 04/06/17 0323  HGB 10.5* 10.7*   Lab Results  Component Value Date   INR 1.13 03/31/2017    A:   Anemia with out bleeding Impella P:  Monitor for bleeding Heparin per Cardiology   INFECTIOUS A:  HCAP UTI with Pseudomonas that is pansensitive Epidydimitis P:   Continue vanc/zosyn for now.  Consider narrowing antibiotics.  04/06/2017 F/u cultures   ENDOCRINE CBG (last 3)  Recent Labs    04/05/17 1208 04/05/17 1613 04/06/17 0807  GLUCAP 132* 194* 202*    A:   DM II   P:  Monitor glucose Sliding scale insulin Wean steroids as tolerated  NEUROLOGIC A:   Anxiety but better controlled 1218 P:   Add ativan prn   FAMILY  - Updates: Patient and wife updated on 12/6  - Inter-disciplinary family meet or Palliative Care meeting due by:  12/9/ 2018  My cc time 62 minutes  Richardson Landry Minor ACNP [facility] PCCM Pager [phone] till 1 pm If no answer page [phone] 04/06/2017, 10:08 AM  Attending Note:  71 year old male with STEMI and cardiogenic shock that developed septic shock and required pressors.  Abx were started and patient improved.  Now off pressors.  On exam, lungs with bibasilar crackles.  I reviewed CXR myself, pulmonary edema noted.  Discussed with PCCM-NP.  Respiratory failure:   COPD:  - BD as ordered  Cardiogenic shock:  - Impella  - D/C pressors  Hypoxemia:  - Titrate O2 for sat of 88-92%  Pulmonary edema:  - Diureses as able  UTI: pseudomonas  - Vanc/zosyn - finish an 8 day course  - F/u on cultures  DM:  - SSI  - CBG  PCCM will sign off, please call back if needed.  Patient  seen and examined, agree with above note.  I dictated the care and orders written for this patient under my direction.  Rush Farmer, [place]

## 2017-04-06 NOTE — Progress Notes (Signed)
Advanced Heart Failure Rounding Note  Subjective:    Admitted 03/31/17 with CP -> STEMI -> Cath with severe 3Vd. 04/01/17 developed fever up to 104. Cultures drawn  Resp panel 04/01/17 negative UCx 04/01/17 with Psuedomonas aeruginosa BCx 04/01/17 NGTD Sputum Cx 04/02/17 with "rare gram + cocci" in sputum.   Impella removed 12/7  Diuresed well. Weight down 14 pounds over night. Breathing better. Wants to get up.     Swan numbers Done personally CVP 9 PA 43/25 (34) PCWP 25 CO 6.3 CI 2.9  FEV1 1.37 (36%) FVC 2.30 (45%)   Objective:   Weight Range: 95.2 kg (209 lb 14.1 oz) Body mass index is 26.95 kg/m.   Vital Signs:   Temp:  [97.7 F (36.5 C)-99.7 F (37.6 C)] 98.2 F (36.8 C) (12/08 0850) Pulse Rate:  [92-113] 100 (12/08 0904) Resp:  [13-24] 13 (12/08 0904) BP: (94-180)/(49-81) 122/71 (12/08 0904) SpO2:  [86 %-100 %] 96 % (12/08 0904) Arterial Line BP: (115-167)/(45-73) 148/62 (12/08 0850) FiO2 (%):  [100 %] 100 % (12/07 2022) Weight:  [95.2 kg (209 lb 14.1 oz)] 95.2 kg (209 lb 14.1 oz) (12/08 0500) Last BM Date: 04/05/17 Weight change: Filed Weights   04/04/17 0400 04/05/17 0500 04/06/17 0500  Weight: 99.8 kg (220 lb 0.3 oz) 101.5 kg (223 lb 12.3 oz) 95.2 kg (209 lb 14.1 oz)   Intake/Output:   Intake/Output Summary (Last 24 hours) at 04/06/2017 0908 Last data filed at 04/06/2017 0800 Gross per 24 hour  Intake 1540.15 ml  Output 5025 ml  Net -3484.85 ml    Physical Exam    General:  Lying in bed.  No resp difficulty HEENT: normal Neck: supple. no JVD. Carotids 2+ bilat; no bruits. No lymphadenopathy or thryomegaly appreciated. Cor: PMI nondisplaced. Regular rate & rhythm. No rubs, gallops or murmurs. Lungs: diffuse rhonchi  Abdomen: soft, nontender, nondistended. No hepatosplenomegaly. No bruits or masses. Good bowel sounds. R impella removal site ok. RFV swan  R DP 1+ Extremities: no cyanosis, clubbing, rash, edema Neuro: alert & orientedx3,  cranial nerves grossly intact. moves all 4 extremities w/o difficulty. Affect pleasant   Telemetry   NSR - ST 90-100s,Personally reviewed   EKG   No new tracing.   Labs    CBC Recent Labs    04/05/17 0352 04/06/17 0323  WBC 28.6* 26.9*  NEUTROABS 24.0* 25.0*  HGB 10.5* 10.7*  HCT 31.6* 31.4*  MCV 88.0 85.1  PLT 199 644   Basic Metabolic Panel Recent Labs    04/05/17 0352 04/06/17 0323  NA 129* 130*  K 3.6 3.8  CL 97* 95*  CO2 21* 23  GLUCOSE 125* 204*  BUN 17 21*  CREATININE 1.11 1.13  CALCIUM 7.6* 7.8*  MG 2.2 2.5*   Liver Function Tests No results for input(s): AST, ALT, ALKPHOS, BILITOT, PROT, ALBUMIN in the last 72 hours. No results for input(s): LIPASE, AMYLASE in the last 72 hours. Cardiac Enzymes No results for input(s): CKTOTAL, CKMB, CKMBINDEX, TROPONINI in the last 72 hours.  BNP: BNP (last 3 results) Recent Labs    03/31/17 2341  BNP 786.9*    ProBNP (last 3 results) No results for input(s): PROBNP in the last 8760 hours.   D-Dimer No results for input(s): DDIMER in the last 72 hours. Hemoglobin A1C No results for input(s): HGBA1C in the last 72 hours. Fasting Lipid Panel No results for input(s): CHOL, HDL, LDLCALC, TRIG, CHOLHDL, LDLDIRECT in the last 72 hours. Thyroid Function Tests No  results for input(s): TSH, T4TOTAL, T3FREE, THYROIDAB in the last 72 hours.  Invalid input(s): FREET3  Other results:   Imaging    Dg Chest Port 1 View  Result Date: 04/05/2017 CLINICAL DATA:  Acute on chronic respiratory failure with hypercapnia EXAM: PORTABLE CHEST 1 VIEW COMPARISON:  04/04/2017 FINDINGS: Progression of bilateral pulmonary edema. Minimal pleural effusion bilaterally. Impella device in the left ventricle unchanged. Swan-Ganz catheter right lower lobe pulmonary artery unchanged. Mild bibasilar atelectasis unchanged IMPRESSION: Interval progression of bilateral pulmonary edema. Electronically Signed   By: Franchot Gallo M.D.    On: 04/05/2017 10:08     Medications:     Scheduled Medications: . arformoterol  15 mcg Nebulization BID  . aspirin EC  81 mg Oral Daily  . atorvastatin  80 mg Oral QHS  . budesonide (PULMICORT) nebulizer solution  0.5 mg Nebulization BID  . Chlorhexidine Gluconate Cloth  6 each Topical q morning - 10a  . digoxin  0.125 mg Oral Daily  . enalapril  2.5 mg Oral BID  . heparin injection (subcutaneous)  5,000 Units Subcutaneous Q8H  . insulin aspart  0-20 Units Subcutaneous TID WC  . mouth rinse  15 mL Mouth Rinse BID  . methylPREDNISolone (SOLU-MEDROL) injection  40 mg Intravenous Q12H  . pantoprazole sodium  40 mg Oral Daily  . sodium chloride flush  10-40 mL Intracatheter Q12H  . sodium chloride flush  3 mL Intravenous Q12H  . spironolactone  12.5 mg Oral Daily    Infusions: . sodium chloride 250 mL (04/06/17 0500)  . sodium chloride 10 mL/hr at 04/06/17 0500  . norepinephrine (LEVOPHED) Adult infusion Stopped (04/02/17 0845)  . piperacillin-tazobactam (ZOSYN)  IV 3.375 g (04/06/17 1610)  . vancomycin Stopped (04/06/17 0219)    PRN Medications: sodium chloride, acetaminophen, hydrALAZINE, ipratropium-albuterol, LORazepam, morphine injection, nitroGLYCERIN, ondansetron (ZOFRAN) IV, oxyCODONE, oxymetazoline, sodium chloride, sodium chloride flush, sodium chloride flush   Patient Profile   71 y/o with anterior STEMI who developed cardiogenic shock. 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 Impella placed for stabilization of shock.   Assessment/Plan   1. Cardiogenic shock - Impella placed overnight 12/2 into 12/3. Output improved.  - Impella removed 12/7 - Hemodynamics and BP stable. Large discrepancy in BP with art line vs BP cuff - Continue diuresis  - Continue dig, spiro and enalapril.  - Will titrate enalapril.  - Pull swan. Place PICC - Scheduled for CABG with Dr. Cyndia Bent on Monday if he is ready  2. ID ->  Sepsis unclear source.  - Fevers improving Tmax 99.  - UCx with pseudomonas.  - BCx NGTD - Sputum culture negative - CCM treating for AECOPD  3. Acute systolic CHF in setting of STEMI: Echo with EF 15-20%.  - Ischemic CMP.  - See above for plan - Diurese. Titrate HF meds. Remove swan. CABG potentially Monday  4. Severe CAD - Dr Cyndia Bent has seen. Plan CABG pending resolution of fever and leukocytosis. Likely Monday - No CP at this time. Off heparin  - Hold BB with shock. - Continue ASA and statin   5. DM2 - Continue SSI. No change.   6. Hypomagnesemia - Stable today.    7. Tobacco abuse - Smoked 1 ppd PTA for > 50 years.  - Will continue to encourage complete cessation. No change.   8. HTN - Meds as above. Will treat in the setting of managing his HF. No change.  9. Hx of bladder CA - Pt states he had multiple rounds of Bacillus Calmette-Guerin (BCG) therapy. No change.   10. Hypokalemia - supp as needed.   11. Scrotal swelling - [place] 04/04/17 with R hydrocele and ? Epididymitis. - Continue to follow fluid status.  On broad spectrum ABX already.    CRITICAL CARE Performed by: Glori Bickers  Total critical care time: 35 minutes  Critical care time was exclusive of separately billable procedures and treating other patients.  Critical care was necessary to treat or prevent imminent or life-threatening deterioration.  Critical care was time spent personally by me (independent of midlevel providers or residents) on the following activities: development of treatment plan with patient and/or surrogate as well as nursing, discussions with consultants, evaluation of patient's response to treatment, examination of patient, obtaining history from patient or surrogate, ordering and performing treatments and interventions, ordering and review of laboratory studies, ordering and review of radiographic studies, pulse oximetry and re-evaluation of patient's  condition.    Length of Stay: [place], MD  04/06/2017, 9:08 AM  Advanced Heart Failure Team Pager [phone] (M-F; 7a - 4p)  Please contact [facility] for night-coverage after hours (4p -7a ) and weekends on [email or web address]

## 2017-04-07 ENCOUNTER — Inpatient Hospital Stay (HOSPITAL_COMMUNITY): Payer: Medicare Other

## 2017-04-07 DIAGNOSIS — I2510 Atherosclerotic heart disease of native coronary artery without angina pectoris: Secondary | ICD-10-CM

## 2017-04-07 LAB — BASIC METABOLIC PANEL
Anion gap: 9 (ref 5–15)
BUN: 30 mg/dL — AB (ref 6–20)
CO2: 24 mmol/L (ref 22–32)
Calcium: 7.8 mg/dL — ABNORMAL LOW (ref 8.9–10.3)
Chloride: 96 mmol/L — ABNORMAL LOW (ref 101–111)
Creatinine, Ser: 1.11 mg/dL (ref 0.61–1.24)
GFR calc non Af Amer: >60 mL/min (ref >60)
Glucose, Bld: 218 mg/dL — ABNORMAL HIGH (ref 65–99)
Potassium: 4 mmol/L (ref 3.5–5.1)
SODIUM: 129 mmol/L — AB (ref 135–145)

## 2017-04-07 LAB — CBC WITH DIFFERENTIAL/PLATELET
BASOS PCT: 0 %
Basophils Absolute: 0 10*3/uL (ref 0.0–0.1)
EOS PCT: 0 %
Eosinophils Absolute: 0 10*3/uL (ref 0.0–0.7)
HCT: 30.7 % — ABNORMAL LOW (ref 39.0–52.0)
Hemoglobin: 10.2 g/dL — ABNORMAL LOW (ref 13.0–17.0)
Lymphocytes Relative: 6 %
Lymphs Abs: 1.5 10*3/uL (ref 0.7–4.0)
MCH: 28.7 pg (ref 26.0–34.0)
MCHC: 33.2 g/dL (ref 30.0–36.0)
MCV: 86.5 fL (ref 78.0–100.0)
MONO ABS: 1 10*3/uL (ref 0.1–1.0)
MONOS PCT: 4 %
NEUTROS PCT: 90 %
Neutro Abs: 22.6 10*3/uL — ABNORMAL HIGH (ref 1.7–7.7)
PLATELETS: 310 10*3/uL (ref 150–400)
RBC: 3.55 MIL/uL — AB (ref 4.22–5.81)
RDW: 14.2 % (ref 11.5–15.5)
WBC: 25.1 10*3/uL — ABNORMAL HIGH (ref 4.0–10.5)

## 2017-04-07 LAB — BLOOD GAS, ARTERIAL
ACID-BASE EXCESS: 0.6 mmol/L (ref 0.0–2.0)
Bicarbonate: 24.1 mmol/L (ref 20.0–28.0)
O2 Content: 4.5 L/min
O2 SAT: 92.8 %
PCO2 ART: 34.8 mmHg (ref 32.0–48.0)
PH ART: 7.455 — AB (ref 7.350–7.450)
PO2 ART: 66 mmHg — AB (ref 83.0–108.0)
Patient temperature: 98.6

## 2017-04-07 LAB — GLUCOSE, CAPILLARY
Glucose-Capillary: 212 mg/dL — ABNORMAL HIGH (ref 65–99)
Glucose-Capillary: 209 mg/dL — ABNORMAL HIGH (ref 65–99)
Glucose-Capillary: 253 mg/dL — ABNORMAL HIGH (ref 65–99)

## 2017-04-07 LAB — PHOSPHORUS: PHOSPHORUS: 3.3 mg/dL (ref 2.5–4.6)

## 2017-04-07 LAB — LACTATE DEHYDROGENASE: LDH: 683 U/L — AB (ref 98–192)

## 2017-04-07 LAB — MAGNESIUM: MAGNESIUM: 2.4 mg/dL (ref 1.7–2.4)

## 2017-04-07 LAB — CALCIUM, IONIZED: CALCIUM, IONIZED, SERUM: 4.6 mg/dL (ref 4.5–5.6)

## 2017-04-07 MED ORDER — NITROGLYCERIN IN D5W 200-5 MCG/ML-% IV SOLN
2.0000 ug/min | INTRAVENOUS | Status: AC
  Administered 2017-04-08: 16.6 ug/min via INTRAVENOUS
  Filled 2017-04-07: qty 250

## 2017-04-07 MED ORDER — TRANEXAMIC ACID (OHS) PUMP PRIME SOLUTION
2.0000 mg/kg | INTRAVENOUS | Status: DC
  Filled 2017-04-07: qty 2.03

## 2017-04-07 MED ORDER — TEMAZEPAM 15 MG PO CAPS: 15.0000 mg | ORAL_CAPSULE | Freq: Once | ORAL | Status: DC | PRN

## 2017-04-07 MED ORDER — SODIUM CHLORIDE 0.9 % IV SOLN
1.5000 mg/kg/h | INTRAVENOUS | Status: AC
  Administered 2017-04-08: 1.5 mg/kg/h via INTRAVENOUS
  Filled 2017-04-07: qty 25

## 2017-04-07 MED ORDER — CHLORHEXIDINE GLUCONATE 0.12 % MT SOLN
15.0000 mL | Freq: Once | OROMUCOSAL | Status: AC
  Administered 2017-04-08: 15 mL via OROMUCOSAL
  Filled 2017-04-07: qty 15

## 2017-04-07 MED ORDER — PLASMA-LYTE 148 IV SOLN
INTRAVENOUS | Status: AC
  Administered 2017-04-08: 500 mL
  Filled 2017-04-07: qty 2.5

## 2017-04-07 MED ORDER — EPINEPHRINE PF 1 MG/ML IJ SOLN
0.0000 ug/min | INTRAVENOUS | Status: AC
  Administered 2017-04-08: 2 ug/min via INTRAVENOUS
  Filled 2017-04-07: qty 4

## 2017-04-07 MED ORDER — PHENYLEPHRINE HCL 10 MG/ML IJ SOLN
30.0000 ug/min | INTRAMUSCULAR | Status: AC
  Administered 2017-04-08: 15 ug/min via INTRAVENOUS
  Filled 2017-04-07: qty 2

## 2017-04-07 MED ORDER — TRANEXAMIC ACID (OHS) BOLUS VIA INFUSION
15.0000 mg/kg | INTRAVENOUS | Status: AC
  Administered 2017-04-08: 1521 mg via INTRAVENOUS
  Filled 2017-04-07: qty 1521

## 2017-04-07 MED ORDER — MILRINONE LACTATE IN DEXTROSE 20-5 MG/100ML-% IV SOLN
0.1250 ug/kg/min | INTRAVENOUS | Status: AC
  Administered 2017-04-08: .3 ug/kg/min via INTRAVENOUS
  Filled 2017-04-07: qty 100

## 2017-04-07 MED ORDER — MAGNESIUM SULFATE 50 % IJ SOLN
40.0000 meq | INTRAMUSCULAR | Status: DC
  Filled 2017-04-07: qty 9.85

## 2017-04-07 MED ORDER — CHLORHEXIDINE GLUCONATE CLOTH 2 % EX PADS
6.0000 | MEDICATED_PAD | Freq: Once | CUTANEOUS | Status: AC
  Administered 2017-04-08: 6 via TOPICAL

## 2017-04-07 MED ORDER — POTASSIUM CHLORIDE 2 MEQ/ML IV SOLN
80.0000 meq | INTRAVENOUS | Status: DC
  Filled 2017-04-07: qty 40

## 2017-04-07 MED ORDER — DEXTROSE 5 % IV SOLN
1.5000 g | INTRAVENOUS | Status: AC
  Administered 2017-04-08: 1.5 g via INTRAVENOUS
  Administered 2017-04-08: .75 g via INTRAVENOUS
  Filled 2017-04-07: qty 1.5

## 2017-04-07 MED ORDER — BISACODYL 5 MG PO TBEC
5.0000 mg | DELAYED_RELEASE_TABLET | Freq: Once | ORAL | Status: AC
  Administered 2017-04-07: 5 mg via ORAL
  Filled 2017-04-07: qty 1

## 2017-04-07 MED ORDER — DIAZEPAM 5 MG PO TABS
5.0000 mg | ORAL_TABLET | Freq: Once | ORAL | Status: AC
  Administered 2017-04-08: 5 mg via ORAL
  Filled 2017-04-07: qty 1

## 2017-04-07 MED ORDER — HEPARIN SODIUM (PORCINE) 1000 UNIT/ML IJ SOLN
INTRAMUSCULAR | Status: DC
  Filled 2017-04-07: qty 30

## 2017-04-07 MED ORDER — SODIUM CHLORIDE 0.9 % IV SOLN
INTRAVENOUS | Status: AC
  Administered 2017-04-08: 3.3 [IU]/h via INTRAVENOUS
  Filled 2017-04-07: qty 1

## 2017-04-07 MED ORDER — METOPROLOL TARTRATE 12.5 MG HALF TABLET
12.5000 mg | ORAL_TABLET | Freq: Once | ORAL | Status: AC
  Administered 2017-04-08: 12.5 mg via ORAL
  Filled 2017-04-07: qty 1

## 2017-04-07 MED ORDER — CEFUROXIME SODIUM 750 MG IJ SOLR
750.0000 mg | INTRAMUSCULAR | Status: DC
  Filled 2017-04-07: qty 750

## 2017-04-07 MED ORDER — VANCOMYCIN HCL 10 G IV SOLR
1500.0000 mg | INTRAVENOUS | Status: DC
  Administered 2017-04-08: 1500 mg via INTRAVENOUS
  Filled 2017-04-07: qty 1500

## 2017-04-07 MED ORDER — DEXMEDETOMIDINE HCL IN NACL 400 MCG/100ML IV SOLN
0.1000 ug/kg/h | INTRAVENOUS | Status: AC
  Administered 2017-04-08: 0.7 ug/kg/h via INTRAVENOUS
  Filled 2017-04-07: qty 100

## 2017-04-07 MED ORDER — DOPAMINE-DEXTROSE 3.2-5 MG/ML-% IV SOLN
0.0000 ug/kg/min | INTRAVENOUS | Status: DC
  Filled 2017-04-07: qty 250

## 2017-04-07 NOTE — Progress Notes (Signed)
7 Days Post-Op Procedure(s) (LRB): LEFT HEART CATH AND CORONARY ANGIOGRAPHY (N/A) RIGHT HEART CATH (Right) VENTRICULAR ASSIST DEVICE INSERTION (Right) Subjective: Some cough and shortness of breath but feels better overall. No chest pain  Objective: Vital signs in last 24 hours: Temp:  [98.1 F (36.7 C)-98.9 F (37.2 C)] 98.1 F (36.7 C) (12/09 1140) Pulse Rate:  [89-107] 95 (12/09 1200) Cardiac Rhythm: Sinus tachycardia (12/08 2000) Resp:  [13-23] 16 (12/09 1200) BP: (106-138)/(58-93) 112/64 (12/09 1200) SpO2:  [87 %-97 %] 91 % (12/09 1200) Arterial Line BP: (127-184)/(50-82) 145/66 (12/09 1200) Weight:  [101.4 kg (223 lb 8.7 oz)] 101.4 kg (223 lb 8.7 oz) (12/09 0444)  Hemodynamic parameters for last 24 hours: PAP: (44-45)/(23-27) 45/27 CVP:  [7 mmHg] 7 mmHg  Intake/Output from previous day: 12/08 0701 - 12/09 0700 In: 1400 [P.O.:240; I.V.:510; IV Piggyback:650] Out: 0998 [Urine:1775] Intake/Output this shift: Total I/O In: 20 [I.V.:20] Out: -   General appearance: alert and cooperative Neurologic: intact Heart: regular rate and rhythm, S1, S2 normal, no murmur, click, rub or gallop Lungs: clear to auscultation bilaterally Abdomen: soft, non-tender; bowel sounds normal; no masses,  no organomegaly Extremities: edema mild  Lab Results: Recent Labs    04/06/17 0323 04/07/17 0500  WBC 26.9* 25.1*  HGB 10.7* 10.2*  HCT 31.4* 30.7*  PLT 231 310   BMET:  Recent Labs    04/06/17 0323 04/07/17 0500  NA 130* 129*  K 3.8 4.0  CL 95* 96*  CO2 23 24  GLUCOSE 204* 218*  BUN 21* 30*  CREATININE 1.13 1.11  CALCIUM 7.8* 7.8*    PT/INR: No results for input(s): LABPROT, INR in the last 72 hours. ABG    Component Value Date/Time   PHART 7.455 (H) 04/07/2017 0450   HCO3 24.1 04/07/2017 0450   TCO2 23 04/06/2017 0355   ACIDBASEDEF 1.0 04/06/2017 0355   O2SAT 92.8 04/07/2017 0450   CBG (last 3)  Recent Labs    04/06/17 2110 04/07/17 0751 04/07/17 1139   GLUCAP 247* 212* 209*    Assessment/Plan: S/P Procedure(s) (LRB): LEFT HEART CATH AND CORONARY ANGIOGRAPHY (N/A) RIGHT HEART CATH (Right) VENTRICULAR ASSIST DEVICE INSERTION (Right)  Severe 3-vessel CAD and severe LV dysfunction, EF 15-20%, after massive MI with peak troponin of 54. Initial presentation with sepsis of unclear etiology, I suspect viral, with high fever and leukocytosis.  Heavy smoker with severe COPD by current PFT's  He seems improved overall. He has been afebrile for the past 48 hrs and leukocytosis has been improving. He remains on vanc and Zosyn. Hemodynamics have been stable without the Impella. I think he is improved enough to have CABG. Ideally it would be great to wait longer for his lungs to completely clear up but I am concerned about the tight coronary stenoses and the risk of further occlusion and death. I discussed the operative procedure with the patient and his including alternatives, benefits and risks; including but not limited to bleeding, blood transfusion, infection, stroke, myocardial infarction, graft failure, heart block requiring a permanent pacemaker, possible need for a temporary or permanent LVAD, prolonged mechanical ventilation, organ dysfunction, and death.  [name] [name] understands and agrees to proceed.  We will schedule surgery for tomorrow.   LOS: 7 days    [name] 04/07/2017

## 2017-04-07 NOTE — Anesthesia Preprocedure Evaluation (Addendum)
Anesthesia Evaluation  Patient identified by MRN, date of birth, ID band Patient awake    Reviewed: Allergy & Precautions, NPO status , Patient's Chart, lab work & pertinent test results  Airway Mallampati: I  TM Distance: >3 FB Neck ROM: Full    Dental   Pulmonary neg pulmonary ROS, Current Smoker,    Pulmonary exam normal        Cardiovascular hypertension, Pt. on medications + CAD and + Past MI  Normal cardiovascular exam  ECHO 04/01/17: Study Conclusions  - Left ventricle: The cavity size was normal. Systolic function was severely reduced. The estimated ejection fraction was in the range of 15% to 20%. Diffuse hypokinesis worse in the inferolateral, mid-apical inferior, mid-apical inferoseptal, anteroseptal, and apical myocardium. Doppler parameters are consistent with abnormal left ventricular relaxation (grade 1 diastolic dysfunction). Doppler parameters are consistent with   indeterminate ventricular filling pressure. - Aortic valve: Transvalvular velocity was within the normal range. There was no stenosis. There was moderate regurgitation. - Mitral valve: Transvalvular velocity was within the normal range. There was no evidence for stenosis. There was no regurgitation. - Right ventricle: The cavity size was normal. Wall thickness was normal. Systolic function was normal. - Right atrium: The atrium was normal in size. - Atrial septum: No defect or patent foramen ovale was identified by color flow Doppler. - Tricuspid valve: There was no regurgitation.   Neuro/Psych negative neurological ROS     GI/Hepatic negative GI ROS, Neg liver ROS,   Endo/Other  diabetes, Type 2, Oral Hypoglycemic Agents  Renal/GU negative Renal ROS   Bladder cancer    Musculoskeletal negative musculoskeletal ROS (+)   Abdominal   Peds  Hematology  (+) Blood dyscrasia, anemia ,   Anesthesia Other Findings Day of surgery medications  reviewed with the patient.  Reproductive/Obstetrics                            Anesthesia Physical Anesthesia Plan  ASA: IV  Anesthesia Plan: General   Post-op Pain Management:    Induction: Intravenous  PONV Risk Score and Plan: 1 and Treatment may vary due to age or medical condition and Midazolam  Airway Management Planned: Oral ETT  Additional Equipment: Arterial line, CVP, PA Cath, TEE and Ultrasound Guidance Line Placement  Intra-op Plan:   Post-operative Plan: Post-operative intubation/ventilation  Informed Consent: I have reviewed the patients History and Physical, chart, labs and discussed the procedure including the risks, benefits and alternatives for the proposed anesthesia with the patient or authorized representative who has indicated his/her understanding and acceptance.     Plan Discussed with: CRNA and Surgeon  Anesthesia Plan Comments:        Anesthesia Quick Evaluation

## 2017-04-07 NOTE — Progress Notes (Signed)
Advanced Heart Failure Rounding Note  Subjective:    Admitted 03/31/17 with CP -> STEMI -> Cath with severe 3Vd. 04/01/17 developed fever up to 104. Cultures drawn  Resp panel 04/01/17 negative UCx 04/01/17 with Psuedomonas aeruginosa BCx 04/01/17 NGTD Sputum Cx 04/02/17 with "rare gram + cocci" in sputum.   Impella removed 12/7 Swan out 12/8   Sat up in chair last night. Feeling better. Still with some SOB. No CP. Afebrile   CXR still with edema. WBC coming down slowly.    PFTs FEV1 1.37 (36%) FVC 2.30 (45%)   Objective:   Weight Range: 101.4 kg (223 lb 8.7 oz) Body mass index is 28.7 kg/m.   Vital Signs:   Temp:  [98.1 F (36.7 C)-98.9 F (37.2 C)] 98.1 F (36.7 C) (12/09 0752) Pulse Rate:  [89-107] 99 (12/09 0821) Resp:  [13-23] 15 (12/09 0821) BP: (106-138)/(58-93) 112/69 (12/09 0821) SpO2:  [90 %-97 %] 93 % (12/09 0821) Arterial Line BP: (121-168)/(50-82) 121/53 (12/09 0600) Weight:  [101.4 kg (223 lb 8.7 oz)] 101.4 kg (223 lb 8.7 oz) (12/09 0444) Last BM Date: 04/06/17 Weight change: Filed Weights   04/05/17 0500 04/06/17 0500 04/07/17 0444  Weight: 101.5 kg (223 lb 12.3 oz) 95.2 kg (209 lb 14.1 oz) 101.4 kg (223 lb 8.7 oz)   Intake/Output:   Intake/Output Summary (Last 24 hours) at 04/07/2017 1051 Last data filed at 04/07/2017 0946 Gross per 24 hour  Intake 1285 ml  Output 1775 ml  Net -490 ml    Physical Exam    General: Lying in bed No resp difficulty HEENT: normal Neck: supple. JVP 8-9 Carotids 2+ bilat; no bruits. No lymphadenopathy or thryomegaly appreciated. Cor: PMI nondisplaced. Regular rate & rhythm. No rubs, gallops or murmurs. Lungs: decreased BS throughout mild EE wheeze Abdomen: soft, nontender, nondistended. No hepatosplenomegaly. No bruits or masses. Good bowel sounds. Extremities: no cyanosis, clubbing, rash, edema R groin site ok. RUE PICC Neuro: alert & orientedx3, cranial nerves grossly intact. moves all 4 extremities w/o  difficulty. Affect pleasant   Telemetry   NSR 90-100 Personally reviewed   Labs    CBC Recent Labs    04/06/17 0323 04/07/17 0500  WBC 26.9* 25.1*  NEUTROABS 25.0* 22.6*  HGB 10.7* 10.2*  HCT 31.4* 30.7*  MCV 85.1 86.5  PLT 231 382   Basic Metabolic Panel Recent Labs    04/06/17 0323 04/07/17 0500  NA 130* 129*  K 3.8 4.0  CL 95* 96*  CO2 23 24  GLUCOSE 204* 218*  BUN 21* 30*  CREATININE 1.13 1.11  CALCIUM 7.8* 7.8*  MG 2.5* 2.4  PHOS  --  3.3   Liver Function Tests No results for input(s): AST, ALT, ALKPHOS, BILITOT, PROT, ALBUMIN in the last 72 hours. No results for input(s): LIPASE, AMYLASE in the last 72 hours. Cardiac Enzymes No results for input(s): CKTOTAL, CKMB, CKMBINDEX, TROPONINI in the last 72 hours.  BNP: BNP (last 3 results) Recent Labs    03/31/17 2341  BNP 786.9*    ProBNP (last 3 results) No results for input(s): PROBNP in the last 8760 hours.   D-Dimer No results for input(s): DDIMER in the last 72 hours. Hemoglobin A1C No results for input(s): HGBA1C in the last 72 hours. Fasting Lipid Panel No results for input(s): CHOL, HDL, LDLCALC, TRIG, CHOLHDL, LDLDIRECT in the last 72 hours. Thyroid Function Tests No results for input(s): TSH, T4TOTAL, T3FREE, THYROIDAB in the last 72 hours.  Invalid input(s): FREET3  Other results:   Imaging    Dg Chest Port 1 View  Result Date: 04/07/2017 CLINICAL DATA:  Followup respiratory failure. EXAM: PORTABLE CHEST 1 VIEW COMPARISON:  04/06/2017 FINDINGS: Right arm PICC tip is in the SVC 3 cm above the right atrium. Persistent edema pattern without evidence of worsening edema. Some worsening of left lower lobe atelectasis. IMPRESSION: Persistent edema pattern.  Worsening of left lower lobe atelectasis. Electronically Signed   By: Nelson Chimes M.D.   On: 04/07/2017 07:02     Medications:     Scheduled Medications: . arformoterol  15 mcg Nebulization BID  . aspirin EC  81 mg Oral Daily   . atorvastatin  80 mg Oral QHS  . budesonide (PULMICORT) nebulizer solution  0.5 mg Nebulization BID  . Chlorhexidine Gluconate Cloth  6 each Topical q morning - 10a  . Chlorhexidine Gluconate Cloth  6 each Topical Daily  . digoxin  0.125 mg Oral Daily  . enalapril  5 mg Oral BID  . furosemide  80 mg Intravenous BID  . heparin injection (subcutaneous)  5,000 Units Subcutaneous Q8H  . insulin aspart  0-20 Units Subcutaneous TID WC  . mouth rinse  15 mL Mouth Rinse BID  . methylPREDNISolone (SOLU-MEDROL) injection  40 mg Intravenous Q12H  . pantoprazole  40 mg Oral Daily  . sodium chloride flush  10-40 mL Intracatheter Q12H  . sodium chloride flush  3 mL Intravenous Q12H  . spironolactone  12.5 mg Oral Daily    Infusions: . sodium chloride 250 mL (04/06/17 0500)  . sodium chloride 10 mL/hr at 04/06/17 0500  . norepinephrine (LEVOPHED) Adult infusion Stopped (04/02/17 0845)  . piperacillin-tazobactam (ZOSYN)  IV 3.375 g (04/07/17 0944)  . vancomycin Stopped (04/07/17 0241)    PRN Medications: sodium chloride, acetaminophen, hydrALAZINE, ipratropium-albuterol, LORazepam, morphine injection, nitroGLYCERIN, ondansetron (ZOFRAN) IV, oxyCODONE, oxymetazoline, sodium chloride, sodium chloride flush, sodium chloride flush   Patient Profile   71 y/o with anterior STEMI who developed cardiogenic shock. 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 Impella placed for stabilization of shock.   Assessment/Plan   1. Cardiogenic shock - Impella removed 12/7. Swan out 12/8  - Hemodynamics and BP improved Large discrepancy in BP with art line vs BP cuff  - Continue dig, spiro and enalapril.  - Will continue IV lasix today - No bblocker yet - Scheduled for CABG with Dr. Cyndia Bent likely tomorrow  2. ID -> Sepsis unclear source.  - Remains afebrile  - UCx with pseudomonas.  - BCx NGTD - Sputum culture negative - CCM treating for AECOPD -  Will drop steroids back to prednisone 40 daily  3. Acute systolic CHF in setting of STEMI: Echo with EF 15-20%.  - Ischemic CMP.  - See above for plan - Continue diuresis.Ambulate gently. Probable CABG tomorrow am  4. Severe CAD - Dr Cyndia Bent has seen. Plan CABG likely tomorrow. Discussed at bedside - No CP at this time. Off heparin  - Hold BB with shock. - Continue ASA and statin   5. DM2 - Continue SSI. No change.   6. Hypomagnesemia - Stable today.    7. Tobacco abuse - Smoked 1 ppd PTA for > 50 years.  - Will continue to encourage complete cessation. No change.   8. HTN - Meds as above. Will treat in the setting of managing his HF. No change.   9. Hx of bladder CA - Pt states  he had multiple rounds of Bacillus Calmette-Guerin (BCG) therapy. No change.   10. Hypokalemia - supp as needed.   11. Scrotal swelling - [place] 04/04/17 with R hydrocele and ? Epididymitis. - Continue to follow fluid status.  On broad spectrum ABX already.     Length of Stay: [place], MD  04/07/2017, 10:51 AM  Advanced Heart Failure Team Pager [phone] (M-F; 7a - 4p)  Please contact [facility] for night-coverage after hours (4p -7a ) and weekends on [email or web address]

## 2017-04-08 ENCOUNTER — Inpatient Hospital Stay (HOSPITAL_COMMUNITY)
Admission: EM | Disposition: A | Discharge status: Home Health | Payer: Self-pay | Source: Home / Self Care | Attending: Surgery

## 2017-04-08 ENCOUNTER — Inpatient Hospital Stay (HOSPITAL_COMMUNITY): Payer: Medicare Other

## 2017-04-08 ENCOUNTER — Encounter (HOSPITAL_COMMUNITY): Payer: Self-pay | Admitting: Certified Registered Nurse Anesthetist

## 2017-04-08 ENCOUNTER — Inpatient Hospital Stay (HOSPITAL_COMMUNITY): Payer: Medicare Other | Admitting: Certified Registered Nurse Anesthetist

## 2017-04-08 DIAGNOSIS — Z951 Presence of aortocoronary bypass graft: Secondary | ICD-10-CM

## 2017-04-08 HISTORY — PX: TEE WITHOUT CARDIOVERSION: SHX5443

## 2017-04-08 HISTORY — PX: CORONARY ARTERY BYPASS GRAFT: SHX141

## 2017-04-08 LAB — POCT I-STAT, CHEM 8
BUN: 29 mg/dL — AB (ref 6–20)
BUN: 28 mg/dL — AB (ref 6–20)
BUN: 31 mg/dL — AB (ref 6–20)
BUN: 31 mg/dL — AB (ref 6–20)
BUN: 32 mg/dL — AB (ref 6–20)
BUN: 30 mg/dL — AB (ref 6–20)
CALCIUM ION: 1.11 mmol/L — AB (ref 1.15–1.40)
CALCIUM ION: 0.99 mmol/L — AB (ref 1.15–1.40)
CALCIUM ION: 1.03 mmol/L — AB (ref 1.15–1.40)
CHLORIDE: 93 mmol/L — AB (ref 101–111)
CHLORIDE: 95 mmol/L — AB (ref 101–111)
CHLORIDE: 95 mmol/L — AB (ref 101–111)
CHLORIDE: 100 mmol/L — AB (ref 101–111)
CREATININE: 1 mg/dL (ref 0.61–1.24)
CREATININE: 1.1 mg/dL (ref 0.61–1.24)
CREATININE: 1.2 mg/dL (ref 0.61–1.24)
Calcium, Ion: 0.96 mmol/L — ABNORMAL LOW (ref 1.15–1.40)
Calcium, Ion: 1.12 mmol/L — ABNORMAL LOW (ref 1.15–1.40)
Calcium, Ion: 1.08 mmol/L — ABNORMAL LOW (ref 1.15–1.40)
Chloride: 93 mmol/L — ABNORMAL LOW (ref 101–111)
Chloride: 95 mmol/L — ABNORMAL LOW (ref 101–111)
Creatinine, Ser: 1.1 mg/dL (ref 0.61–1.24)
Creatinine, Ser: 0.9 mg/dL (ref 0.61–1.24)
Creatinine, Ser: 1.2 mg/dL (ref 0.61–1.24)
GLUCOSE: 195 mg/dL — AB (ref 65–99)
GLUCOSE: 128 mg/dL — AB (ref 65–99)
Glucose, Bld: 226 mg/dL — ABNORMAL HIGH (ref 65–99)
Glucose, Bld: 185 mg/dL — ABNORMAL HIGH (ref 65–99)
Glucose, Bld: 207 mg/dL — ABNORMAL HIGH (ref 65–99)
Glucose, Bld: 229 mg/dL — ABNORMAL HIGH (ref 65–99)
HCT: 30 % — ABNORMAL LOW (ref 39.0–52.0)
HCT: 25 % — ABNORMAL LOW (ref 39.0–52.0)
HCT: 27 % — ABNORMAL LOW (ref 39.0–52.0)
HEMATOCRIT: 31 % — AB (ref 39.0–52.0)
HEMATOCRIT: 26 % — AB (ref 39.0–52.0)
HEMATOCRIT: 29 % — AB (ref 39.0–52.0)
HEMOGLOBIN: 10.5 g/dL — AB (ref 13.0–17.0)
Hemoglobin: 10.2 g/dL — ABNORMAL LOW (ref 13.0–17.0)
Hemoglobin: 8.8 g/dL — ABNORMAL LOW (ref 13.0–17.0)
Hemoglobin: 8.5 g/dL — ABNORMAL LOW (ref 13.0–17.0)
Hemoglobin: 9.9 g/dL — ABNORMAL LOW (ref 13.0–17.0)
Hemoglobin: 9.2 g/dL — ABNORMAL LOW (ref 13.0–17.0)
POTASSIUM: 4.1 mmol/L (ref 3.5–5.1)
POTASSIUM: 3.7 mmol/L (ref 3.5–5.1)
Potassium: 3.8 mmol/L (ref 3.5–5.1)
Potassium: 4 mmol/L (ref 3.5–5.1)
Potassium: 5 mmol/L (ref 3.5–5.1)
Potassium: 4.3 mmol/L (ref 3.5–5.1)
SODIUM: 134 mmol/L — AB (ref 135–145)
SODIUM: 133 mmol/L — AB (ref 135–145)
SODIUM: 135 mmol/L (ref 135–145)
Sodium: 133 mmol/L — ABNORMAL LOW (ref 135–145)
Sodium: 134 mmol/L — ABNORMAL LOW (ref 135–145)
Sodium: 135 mmol/L (ref 135–145)
TCO2: 29 mmol/L (ref 22–32)
TCO2: 29 mmol/L (ref 22–32)
TCO2: 29 mmol/L (ref 22–32)
TCO2: 30 mmol/L (ref 22–32)
TCO2: 26 mmol/L (ref 22–32)
TCO2: 24 mmol/L (ref 22–32)

## 2017-04-08 LAB — POCT I-STAT 3, ART BLOOD GAS (G3+)
ACID-BASE DEFICIT: 2 mmol/L (ref 0.0–2.0)
ACID-BASE DEFICIT: 1 mmol/L (ref 0.0–2.0)
ACID-BASE EXCESS: 2 mmol/L (ref 0.0–2.0)
ACID-BASE EXCESS: 1 mmol/L (ref 0.0–2.0)
Acid-Base Excess: 3 mmol/L — ABNORMAL HIGH (ref 0.0–2.0)
Acid-base deficit: 2 mmol/L (ref 0.0–2.0)
BICARBONATE: 25.4 mmol/L (ref 20.0–28.0)
BICARBONATE: 29.5 mmol/L — AB (ref 20.0–28.0)
BICARBONATE: 23.2 mmol/L (ref 20.0–28.0)
Bicarbonate: 26.5 mmol/L (ref 20.0–28.0)
Bicarbonate: 23.9 mmol/L (ref 20.0–28.0)
Bicarbonate: 23.4 mmol/L (ref 20.0–28.0)
O2 SAT: 100 %
O2 SAT: 99 %
O2 SAT: 97 %
O2 SAT: 98 %
O2 Saturation: 95 %
O2 Saturation: 96 %
PCO2 ART: 54.2 mmHg — AB (ref 32.0–48.0)
PCO2 ART: 37.5 mmHg (ref 32.0–48.0)
PH ART: 7.465 — AB (ref 7.350–7.450)
PH ART: 7.332 — AB (ref 7.350–7.450)
PH ART: 7.398 (ref 7.350–7.450)
PO2 ART: 407 mmHg — AB (ref 83.0–108.0)
PO2 ART: 160 mmHg — AB (ref 83.0–108.0)
PO2 ART: 95 mmHg (ref 83.0–108.0)
PO2 ART: 101 mmHg (ref 83.0–108.0)
Patient temperature: 36.5
TCO2: 26 mmol/L (ref 22–32)
TCO2: 31 mmol/L (ref 22–32)
TCO2: 28 mmol/L (ref 22–32)
TCO2: 25 mmol/L (ref 22–32)
TCO2: 25 mmol/L (ref 22–32)
TCO2: 24 mmol/L (ref 22–32)
pCO2 arterial: 35.1 mmHg (ref 32.0–48.0)
pCO2 arterial: 48.2 mmHg — ABNORMAL HIGH (ref 32.0–48.0)
pCO2 arterial: 44.6 mmHg (ref 32.0–48.0)
pCO2 arterial: 37.7 mmHg (ref 32.0–48.0)
pH, Arterial: 7.344 — ABNORMAL LOW (ref 7.350–7.450)
pH, Arterial: 7.348 — ABNORMAL LOW (ref 7.350–7.450)
pH, Arterial: 7.398 (ref 7.350–7.450)
pO2, Arterial: 71 mmHg — ABNORMAL LOW (ref 83.0–108.0)
pO2, Arterial: 78 mmHg — ABNORMAL LOW (ref 83.0–108.0)

## 2017-04-08 LAB — GLUCOSE, CAPILLARY
GLUCOSE-CAPILLARY: 293 mg/dL — AB (ref 65–99)
GLUCOSE-CAPILLARY: 188 mg/dL — AB (ref 65–99)
GLUCOSE-CAPILLARY: 144 mg/dL — AB (ref 65–99)
GLUCOSE-CAPILLARY: 114 mg/dL — AB (ref 65–99)
GLUCOSE-CAPILLARY: 105 mg/dL — AB (ref 65–99)
Glucose-Capillary: 195 mg/dL — ABNORMAL HIGH (ref 65–99)
Glucose-Capillary: 166 mg/dL — ABNORMAL HIGH (ref 65–99)
Glucose-Capillary: 135 mg/dL — ABNORMAL HIGH (ref 65–99)
Glucose-Capillary: 120 mg/dL — ABNORMAL HIGH (ref 65–99)
Glucose-Capillary: 117 mg/dL — ABNORMAL HIGH (ref 65–99)

## 2017-04-08 LAB — CBC
HCT: 28.6 % — ABNORMAL LOW (ref 39.0–52.0)
HEMATOCRIT: 25 % — AB (ref 39.0–52.0)
Hemoglobin: 9.4 g/dL — ABNORMAL LOW (ref 13.0–17.0)
Hemoglobin: 8.6 g/dL — ABNORMAL LOW (ref 13.0–17.0)
MCH: 29.2 pg (ref 26.0–34.0)
MCH: 30.3 pg (ref 26.0–34.0)
MCHC: 32.9 g/dL (ref 30.0–36.0)
MCHC: 34.4 g/dL (ref 30.0–36.0)
MCV: 88.8 fL (ref 78.0–100.0)
MCV: 88 fL (ref 78.0–100.0)
PLATELETS: 389 10*3/uL (ref 150–400)
Platelets: 396 10*3/uL (ref 150–400)
RBC: 3.22 MIL/uL — ABNORMAL LOW (ref 4.22–5.81)
RBC: 2.84 MIL/uL — ABNORMAL LOW (ref 4.22–5.81)
RDW: 14.4 % (ref 11.5–15.5)
RDW: 14.3 % (ref 11.5–15.5)
WBC: 54.2 10*3/uL — AB (ref 4.0–10.5)
WBC: 49 10*3/uL — AB (ref 4.0–10.5)

## 2017-04-08 LAB — MAGNESIUM
MAGNESIUM: 2.2 mg/dL (ref 1.7–2.4)
MAGNESIUM: 3.4 mg/dL — AB (ref 1.7–2.4)

## 2017-04-08 LAB — CBC WITH DIFFERENTIAL/PLATELET
BASOS ABS: 0 10*3/uL (ref 0.0–0.1)
Basophils Relative: 0 %
EOS ABS: 0 10*3/uL (ref 0.0–0.7)
Eosinophils Relative: 0 %
HCT: 30.9 % — ABNORMAL LOW (ref 39.0–52.0)
Hemoglobin: 10.8 g/dL — ABNORMAL LOW (ref 13.0–17.0)
LYMPHS PCT: 7 %
Lymphs Abs: 1.7 10*3/uL (ref 0.7–4.0)
MCH: 30.5 pg (ref 26.0–34.0)
MCHC: 35 g/dL (ref 30.0–36.0)
MCV: 87.3 fL (ref 78.0–100.0)
Monocytes Absolute: 1.7 10*3/uL — ABNORMAL HIGH (ref 0.1–1.0)
Monocytes Relative: 7 %
NEUTROS PCT: 86 %
Neutro Abs: 20.6 10*3/uL — ABNORMAL HIGH (ref 1.7–7.7)
PLATELETS: 407 10*3/uL — AB (ref 150–400)
RBC: 3.54 MIL/uL — AB (ref 4.22–5.81)
RDW: 14.3 % (ref 11.5–15.5)
WBC: 24 10*3/uL — AB (ref 4.0–10.5)

## 2017-04-08 LAB — BASIC METABOLIC PANEL
Anion gap: 11 (ref 5–15)
BUN: 32 mg/dL — AB (ref 6–20)
CALCIUM: 7.8 mg/dL — AB (ref 8.9–10.3)
CO2: 24 mmol/L (ref 22–32)
CREATININE: 1.29 mg/dL — AB (ref 0.61–1.24)
Chloride: 95 mmol/L — ABNORMAL LOW (ref 101–111)
GFR calc non Af Amer: 54 mL/min — ABNORMAL LOW (ref >60)
Glucose, Bld: 240 mg/dL — ABNORMAL HIGH (ref 65–99)
Potassium: 3.8 mmol/L (ref 3.5–5.1)
SODIUM: 130 mmol/L — AB (ref 135–145)

## 2017-04-08 LAB — POCT I-STAT 4, (NA,K, GLUC, HGB,HCT)
GLUCOSE: 202 mg/dL — AB (ref 65–99)
HEMATOCRIT: 29 % — AB (ref 39.0–52.0)
Hemoglobin: 9.9 g/dL — ABNORMAL LOW (ref 13.0–17.0)
POTASSIUM: 3.8 mmol/L (ref 3.5–5.1)
Sodium: 136 mmol/L (ref 135–145)

## 2017-04-08 LAB — CREATININE, SERUM
CREATININE: 1.31 mg/dL — AB (ref 0.61–1.24)
GFR calc Af Amer: >60 mL/min (ref >60)
GFR, EST NON AFRICAN AMERICAN: 53 mL/min — AB (ref >60)

## 2017-04-08 LAB — PLATELET COUNT: PLATELETS: 350 10*3/uL (ref 150–400)

## 2017-04-08 LAB — HEMOGLOBIN AND HEMATOCRIT, BLOOD
HEMATOCRIT: 24.2 % — AB (ref 39.0–52.0)
HEMOGLOBIN: 8.1 g/dL — AB (ref 13.0–17.0)

## 2017-04-08 LAB — LACTATE DEHYDROGENASE: LDH: 621 U/L — ABNORMAL HIGH (ref 98–192)

## 2017-04-08 LAB — ABO/RH: ABO/RH(D): A POS

## 2017-04-08 LAB — APTT: APTT: 53 s — AB (ref 24–36)

## 2017-04-08 LAB — PREPARE RBC (CROSSMATCH)

## 2017-04-08 LAB — SURGICAL PCR SCREEN
MRSA, PCR: NEGATIVE
STAPHYLOCOCCUS AUREUS: POSITIVE — AB

## 2017-04-08 LAB — PROTIME-INR
INR: 1.35
Prothrombin Time: 16.5 seconds — ABNORMAL HIGH (ref 11.4–15.2)

## 2017-04-08 SURGERY — CORONARY ARTERY BYPASS GRAFTING (CABG)
Anesthesia: General | Site: Chest

## 2017-04-08 MED ORDER — TRAMADOL HCL 50 MG PO TABS
50.0000 mg | ORAL_TABLET | ORAL | Status: DC | PRN
  Administered 2017-04-08 – 2017-04-09 (×2): 100 mg via ORAL
  Filled 2017-04-08 (×2): qty 2

## 2017-04-08 MED ORDER — CHLORHEXIDINE GLUCONATE CLOTH 2 % EX PADS
6.0000 | MEDICATED_PAD | Freq: Every day | CUTANEOUS | Status: DC
  Administered 2017-04-09 – 2017-04-16 (×8): 6 via TOPICAL

## 2017-04-08 MED ORDER — SODIUM CHLORIDE 0.9% FLUSH
10.0000 mL | Freq: Two times a day (BID) | INTRAVENOUS | Status: DC
  Administered 2017-04-08: 10 mL
  Administered 2017-04-09: 20 mL
  Administered 2017-04-09 – 2017-04-10 (×3): 10 mL
  Administered 2017-04-12: 20 mL
  Administered 2017-04-12: 10 mL

## 2017-04-08 MED ORDER — SODIUM CHLORIDE 0.9 % IV SOLN
0.0000 ug/kg/h | INTRAVENOUS | Status: DC
  Administered 2017-04-08: 0.4 ug/kg/h via INTRAVENOUS

## 2017-04-08 MED ORDER — ONDANSETRON HCL 4 MG/2ML IJ SOLN: 4.0000 mg | Freq: Four times a day (QID) | INTRAMUSCULAR | Status: DC | PRN

## 2017-04-08 MED ORDER — MIDAZOLAM HCL 5 MG/5ML IJ SOLN
INTRAMUSCULAR | Status: DC | PRN
Start: 2017-04-08 — End: 2017-04-08
  Administered 2017-04-08 (×2): 2 mg via INTRAVENOUS
  Administered 2017-04-08 (×2): 1 mg via INTRAVENOUS
  Administered 2017-04-08 (×2): 2 mg via INTRAVENOUS

## 2017-04-08 MED ORDER — FENTANYL CITRATE (PF) 250 MCG/5ML IJ SOLN
INTRAMUSCULAR | Status: AC
  Filled 2017-04-08: qty 5

## 2017-04-08 MED ORDER — THROMBIN (RECOMBINANT) 5000 UNITS EX SOLR
CUTANEOUS | Status: AC
  Filled 2017-04-08: qty 15000

## 2017-04-08 MED ORDER — LACTATED RINGERS IV SOLN
INTRAVENOUS | Status: DC | PRN
  Administered 2017-04-08: 07:00:00 via INTRAVENOUS

## 2017-04-08 MED ORDER — HEPARIN SODIUM (PORCINE) 1000 UNIT/ML IJ SOLN
INTRAMUSCULAR | Status: AC
  Filled 2017-04-08: qty 1

## 2017-04-08 MED ORDER — DOCUSATE SODIUM 100 MG PO CAPS
200.0000 mg | ORAL_CAPSULE | Freq: Every day | ORAL | Status: DC
  Administered 2017-04-09 – 2017-04-11 (×3): 200 mg via ORAL
  Filled 2017-04-08 (×2): qty 2

## 2017-04-08 MED ORDER — BISACODYL 5 MG PO TBEC
10.0000 mg | DELAYED_RELEASE_TABLET | Freq: Every day | ORAL | Status: DC
  Administered 2017-04-09 – 2017-04-11 (×3): 10 mg via ORAL
  Filled 2017-04-08 (×2): qty 2

## 2017-04-08 MED ORDER — PROPOFOL 10 MG/ML IV BOLUS
INTRAVENOUS | Status: AC
  Filled 2017-04-08: qty 20

## 2017-04-08 MED ORDER — MORPHINE SULFATE (PF) 4 MG/ML IV SOLN: 1.0000 mg | INTRAVENOUS | Status: DC | PRN

## 2017-04-08 MED ORDER — ACETAMINOPHEN 500 MG PO TABS
1000.0000 mg | ORAL_TABLET | Freq: Four times a day (QID) | ORAL | Status: AC
  Administered 2017-04-09 – 2017-04-13 (×20): 1000 mg via ORAL
  Filled 2017-04-08 (×20): qty 2

## 2017-04-08 MED ORDER — OXYCODONE HCL 5 MG PO TABS
5.0000 mg | ORAL_TABLET | ORAL | Status: DC | PRN
  Administered 2017-04-08 – 2017-04-09 (×5): 10 mg via ORAL
  Administered 2017-04-10 – 2017-04-15 (×2): 5 mg via ORAL
  Administered 2017-04-15: 10 mg via ORAL
  Filled 2017-04-08: qty 2
  Filled 2017-04-08: qty 1
  Filled 2017-04-08 (×5): qty 2

## 2017-04-08 MED ORDER — ROCURONIUM BROMIDE 10 MG/ML (PF) SYRINGE
PREFILLED_SYRINGE | INTRAVENOUS | Status: AC
  Filled 2017-04-08: qty 5

## 2017-04-08 MED ORDER — METOPROLOL TARTRATE 25 MG/10 ML ORAL SUSPENSION: 12.5000 mg | Freq: Two times a day (BID) | ORAL | Status: DC

## 2017-04-08 MED ORDER — NOREPINEPHRINE BITARTRATE 1 MG/ML IV SOLN
0.0000 ug/min | INTRAVENOUS | Status: DC
  Administered 2017-04-08: 9 ug/min via INTRAVENOUS
  Administered 2017-04-08 – 2017-04-09 (×2): 3 ug/min via INTRAVENOUS
  Filled 2017-04-08 (×5): qty 4

## 2017-04-08 MED ORDER — SODIUM CHLORIDE 0.9 % IV SOLN: 250.0000 mL | INTRAVENOUS | Status: DC

## 2017-04-08 MED ORDER — LACTATED RINGERS IV SOLN: 500.0000 mL | Freq: Once | INTRAVENOUS | Status: DC | PRN

## 2017-04-08 MED ORDER — CHLORHEXIDINE GLUCONATE 0.12% ORAL RINSE (MEDLINE KIT)
15.0000 mL | Freq: Two times a day (BID) | OROMUCOSAL | Status: DC
  Administered 2017-04-08: 15 mL via OROMUCOSAL

## 2017-04-08 MED ORDER — SODIUM CHLORIDE 0.45 % IV SOLN
INTRAVENOUS | Status: DC | PRN
  Administered 2017-04-08: 14:00:00 via INTRAVENOUS

## 2017-04-08 MED ORDER — MUPIROCIN 2 % EX OINT: 1.0000 "application " | TOPICAL_OINTMENT | Freq: Two times a day (BID) | CUTANEOUS | Status: DC

## 2017-04-08 MED ORDER — ACETAMINOPHEN 160 MG/5ML PO SOLN: 650.0000 mg | Freq: Once | ORAL | Status: AC

## 2017-04-08 MED ORDER — EPINEPHRINE PF 1 MG/ML IJ SOLN
0.0000 ug/min | INTRAVENOUS | Status: DC
  Filled 2017-04-08 (×2): qty 4

## 2017-04-08 MED ORDER — PANTOPRAZOLE SODIUM 40 MG PO TBEC
40.0000 mg | DELAYED_RELEASE_TABLET | Freq: Every day | ORAL | Status: DC
  Administered 2017-04-10 – 2017-04-18 (×9): 40 mg via ORAL
  Filled 2017-04-08 (×9): qty 1

## 2017-04-08 MED ORDER — POTASSIUM CHLORIDE 10 MEQ/50ML IV SOLN
10.0000 meq | INTRAVENOUS | Status: AC
Start: 2017-04-08 — End: 2017-04-09
  Administered 2017-04-08 (×3): 10 meq via INTRAVENOUS
  Filled 2017-04-08 (×3): qty 50

## 2017-04-08 MED ORDER — SODIUM CHLORIDE 0.9 % IV SOLN
INTRAVENOUS | Status: DC
  Administered 2017-04-08: 7.5 [IU]/h via INTRAVENOUS
  Filled 2017-04-08: qty 1

## 2017-04-08 MED ORDER — CHLORHEXIDINE GLUCONATE CLOTH 2 % EX PADS: 6.0000 | MEDICATED_PAD | Freq: Every day | CUTANEOUS | Status: DC

## 2017-04-08 MED ORDER — MILRINONE LACTATE IN DEXTROSE 20-5 MG/100ML-% IV SOLN
0.3000 ug/kg/min | INTRAVENOUS | Status: DC
  Administered 2017-04-08 – 2017-04-09 (×2): 0.3 ug/kg/min via INTRAVENOUS
  Administered 2017-04-09: 0.175 ug/kg/min via INTRAVENOUS
  Filled 2017-04-08 (×3): qty 100

## 2017-04-08 MED ORDER — PROTAMINE SULFATE 10 MG/ML IV SOLN
INTRAVENOUS | Status: DC | PRN
  Administered 2017-04-08: 300 mg via INTRAVENOUS

## 2017-04-08 MED ORDER — 0.9 % SODIUM CHLORIDE (POUR BTL) OPTIME
TOPICAL | Status: DC | PRN
  Administered 2017-04-08: 6000 mL

## 2017-04-08 MED ORDER — NITROGLYCERIN 0.2 MG/ML ON CALL CATH LAB
INTRAVENOUS | Status: DC | PRN
  Administered 2017-04-08 (×3): 20 ug via INTRAVENOUS

## 2017-04-08 MED ORDER — NOREPINEPHRINE BITARTRATE 1 MG/ML IV SOLN
0.0000 ug/min | INTRAVENOUS | Status: DC
  Filled 2017-04-08: qty 4

## 2017-04-08 MED ORDER — ARTIFICIAL TEARS OPHTHALMIC OINT
TOPICAL_OINTMENT | OPHTHALMIC | Status: DC | PRN
  Administered 2017-04-08: 1 via OPHTHALMIC

## 2017-04-08 MED ORDER — ROCURONIUM BROMIDE 10 MG/ML (PF) SYRINGE
PREFILLED_SYRINGE | INTRAVENOUS | Status: DC | PRN
  Administered 2017-04-08 (×3): 50 mg via INTRAVENOUS

## 2017-04-08 MED ORDER — SODIUM CHLORIDE 0.9 % IV SOLN: INTRAVENOUS | Status: DC

## 2017-04-08 MED ORDER — MAGNESIUM SULFATE 4 GM/100ML IV SOLN
4.0000 g | Freq: Once | INTRAVENOUS | Status: AC
  Administered 2017-04-08: 4 g via INTRAVENOUS
  Filled 2017-04-08: qty 100

## 2017-04-08 MED ORDER — MORPHINE SULFATE (PF) 2 MG/ML IV SOLN: 2.0000 mg | INTRAVENOUS | Status: DC | PRN

## 2017-04-08 MED ORDER — LACTATED RINGERS IV SOLN: INTRAVENOUS | Status: DC

## 2017-04-08 MED ORDER — HEMOSTATIC AGENTS (NO CHARGE) OPTIME
TOPICAL | Status: DC | PRN
  Administered 2017-04-08: 1 via TOPICAL

## 2017-04-08 MED ORDER — MIDAZOLAM HCL 10 MG/2ML IJ SOLN
INTRAMUSCULAR | Status: AC
  Filled 2017-04-08: qty 2

## 2017-04-08 MED ORDER — ALBUMIN HUMAN 5 % IV SOLN
250.0000 mL | INTRAVENOUS | Status: DC | PRN
  Administered 2017-04-08 (×2): 250 mL via INTRAVENOUS

## 2017-04-08 MED ORDER — CHLORHEXIDINE GLUCONATE 0.12 % MT SOLN
15.0000 mL | OROMUCOSAL | Status: AC
  Administered 2017-04-08: 15 mL via OROMUCOSAL

## 2017-04-08 MED ORDER — MORPHINE SULFATE (PF) 4 MG/ML IV SOLN
2.0000 mg | INTRAVENOUS | Status: DC | PRN
  Administered 2017-04-08 (×2): 2 mg via INTRAVENOUS
  Administered 2017-04-09 – 2017-04-11 (×7): 4 mg via INTRAVENOUS
  Filled 2017-04-08 (×8): qty 1

## 2017-04-08 MED ORDER — SODIUM CHLORIDE 0.9% FLUSH: 3.0000 mL | INTRAVENOUS | Status: DC | PRN

## 2017-04-08 MED ORDER — THROMBIN (RECOMBINANT) 5000 UNITS EX SOLR
CUTANEOUS | Status: DC | PRN
  Administered 2017-04-08 (×3): 5000 [IU] via TOPICAL

## 2017-04-08 MED ORDER — MIDAZOLAM HCL 2 MG/2ML IJ SOLN: 2.0000 mg | INTRAMUSCULAR | Status: DC | PRN

## 2017-04-08 MED ORDER — SODIUM CHLORIDE 0.9% FLUSH
3.0000 mL | Freq: Two times a day (BID) | INTRAVENOUS | Status: DC
  Administered 2017-04-09 – 2017-04-10 (×3): 3 mL via INTRAVENOUS

## 2017-04-08 MED ORDER — ASPIRIN 81 MG PO CHEW
324.0000 mg | CHEWABLE_TABLET | Freq: Every day | ORAL | Status: DC
  Administered 2017-04-09: 324 mg

## 2017-04-08 MED ORDER — SODIUM CHLORIDE 0.9 % IJ SOLN
OROMUCOSAL | Status: DC | PRN
  Administered 2017-04-08 (×3): via TOPICAL

## 2017-04-08 MED ORDER — NITROGLYCERIN IN D5W 200-5 MCG/ML-% IV SOLN: 0.0000 ug/min | INTRAVENOUS | Status: DC

## 2017-04-08 MED ORDER — ETOMIDATE 2 MG/ML IV SOLN
INTRAVENOUS | Status: AC
  Filled 2017-04-08: qty 10

## 2017-04-08 MED ORDER — SODIUM CHLORIDE 0.9 % IV SOLN
INTRAVENOUS | Status: DC | PRN
  Administered 2017-04-08: 12:00:00 via INTRAVENOUS

## 2017-04-08 MED ORDER — FENTANYL CITRATE (PF) 250 MCG/5ML IJ SOLN
INTRAMUSCULAR | Status: AC
  Filled 2017-04-08: qty 25

## 2017-04-08 MED ORDER — INSULIN REGULAR BOLUS VIA INFUSION
0.0000 [IU] | Freq: Three times a day (TID) | INTRAVENOUS | Status: DC
  Filled 2017-04-08: qty 10

## 2017-04-08 MED ORDER — SODIUM CHLORIDE 0.9 % IV SOLN
0.0000 ug/min | INTRAVENOUS | Status: DC
  Administered 2017-04-08: 70 ug/min via INTRAVENOUS
  Administered 2017-04-08 – 2017-04-09 (×2): 80 ug/min via INTRAVENOUS
  Administered 2017-04-09: 60 ug/min via INTRAVENOUS
  Filled 2017-04-08 (×6): qty 2

## 2017-04-08 MED ORDER — POTASSIUM CHLORIDE 10 MEQ/50ML IV SOLN
10.0000 meq | INTRAVENOUS | Status: AC
  Administered 2017-04-08 (×3): 10 meq via INTRAVENOUS

## 2017-04-08 MED ORDER — MORPHINE SULFATE (PF) 2 MG/ML IV SOLN: 1.0000 mg | INTRAVENOUS | Status: DC | PRN

## 2017-04-08 MED ORDER — ACETAMINOPHEN 650 MG RE SUPP
650.0000 mg | Freq: Once | RECTAL | Status: AC
  Administered 2017-04-08: 650 mg via RECTAL

## 2017-04-08 MED ORDER — FAMOTIDINE IN NACL 20-0.9 MG/50ML-% IV SOLN
20.0000 mg | Freq: Two times a day (BID) | INTRAVENOUS | Status: DC
  Administered 2017-04-08: 20 mg via INTRAVENOUS
  Filled 2017-04-08: qty 50

## 2017-04-08 MED ORDER — FENTANYL CITRATE (PF) 250 MCG/5ML IJ SOLN
INTRAMUSCULAR | Status: DC | PRN
  Administered 2017-04-08: 100 ug via INTRAVENOUS
  Administered 2017-04-08: 200 ug via INTRAVENOUS
  Administered 2017-04-08 (×2): 100 ug via INTRAVENOUS
  Administered 2017-04-08 (×3): 50 ug via INTRAVENOUS
  Administered 2017-04-08: 1000 ug via INTRAVENOUS
  Administered 2017-04-08: 100 ug via INTRAVENOUS

## 2017-04-08 MED ORDER — BISACODYL 10 MG RE SUPP: 10.0000 mg | Freq: Every day | RECTAL | Status: DC

## 2017-04-08 MED ORDER — ASPIRIN EC 325 MG PO TBEC
325.0000 mg | DELAYED_RELEASE_TABLET | Freq: Every day | ORAL | Status: DC
  Administered 2017-04-10 – 2017-04-14 (×5): 325 mg via ORAL
  Filled 2017-04-08 (×6): qty 1

## 2017-04-08 MED ORDER — PROPOFOL 10 MG/ML IV BOLUS
INTRAVENOUS | Status: DC | PRN
  Administered 2017-04-08: 10 mg via INTRAVENOUS

## 2017-04-08 MED ORDER — ACETAMINOPHEN 160 MG/5ML PO SOLN: 1000.0000 mg | Freq: Four times a day (QID) | ORAL | Status: AC

## 2017-04-08 MED ORDER — HEPARIN SODIUM (PORCINE) 1000 UNIT/ML IJ SOLN
INTRAMUSCULAR | Status: DC | PRN
  Administered 2017-04-08: 33000 [IU] via INTRAVENOUS

## 2017-04-08 MED ORDER — METOPROLOL TARTRATE 12.5 MG HALF TABLET: 12.5000 mg | ORAL_TABLET | Freq: Two times a day (BID) | ORAL | Status: DC

## 2017-04-08 MED ORDER — METOPROLOL TARTRATE 5 MG/5ML IV SOLN: 2.5000 mg | INTRAVENOUS | Status: DC | PRN

## 2017-04-08 MED ORDER — ORAL CARE MOUTH RINSE
15.0000 mL | Freq: Four times a day (QID) | OROMUCOSAL | Status: DC
  Administered 2017-04-09: 15 mL via OROMUCOSAL

## 2017-04-08 SURGICAL SUPPLY — 108 items
BAG DECANTER FOR FLEXI CONT (MISCELLANEOUS) ×4 IMPLANT
BANDAGE ACE 4X5 VEL STRL LF (GAUZE/BANDAGES/DRESSINGS) ×6 IMPLANT
BANDAGE ACE 6X5 VEL STRL LF (GAUZE/BANDAGES/DRESSINGS) ×6 IMPLANT
BANDAGE ELASTIC 4 VELCRO ST LF (GAUZE/BANDAGES/DRESSINGS) ×4 IMPLANT
BANDAGE ELASTIC 6 VELCRO ST LF (GAUZE/BANDAGES/DRESSINGS) ×4 IMPLANT
BASKET HEART  (ORDER IN 25'S) (MISCELLANEOUS) ×1
BASKET HEART (ORDER IN 25'S) (MISCELLANEOUS) ×1
BASKET HEART (ORDER IN 25S) (MISCELLANEOUS) ×2 IMPLANT
BLADE NDL 3 SS STRL (BLADE) IMPLANT
BLADE NEEDLE 3 SS STRL (BLADE) ×3 IMPLANT
BLADE NEEDLE 3MM SS STRL (BLADE) ×1
BLADE STERNUM SYSTEM 6 (BLADE) ×4 IMPLANT
BNDG GAUZE ELAST 4 BULKY (GAUZE/BANDAGES/DRESSINGS) ×6 IMPLANT
CANISTER SUCT 3000ML PPV (MISCELLANEOUS) ×4 IMPLANT
CATH ROBINSON RED A/P 18FR (CATHETERS) ×8 IMPLANT
CATH THORACIC 28FR (CATHETERS) ×6 IMPLANT
CATH THORACIC 36FR (CATHETERS) ×4 IMPLANT
CATH THORACIC 36FR RT ANG (CATHETERS) ×4 IMPLANT
CLIP VESOCCLUDE MED 24/CT (CLIP) IMPLANT
CLIP VESOCCLUDE SM WIDE 24/CT (CLIP) ×2 IMPLANT
CRADLE DONUT ADULT HEAD (MISCELLANEOUS) ×4 IMPLANT
DRAPE CARDIOVASCULAR INCISE (DRAPES) ×4
DRAPE SLUSH/WARMER DISC (DRAPES) ×4 IMPLANT
DRAPE SRG 135X102X78XABS (DRAPES) ×2 IMPLANT
DRSG COVADERM 4X14 (GAUZE/BANDAGES/DRESSINGS) ×4 IMPLANT
ELECT CAUTERY BLADE 6.4 (BLADE) ×4 IMPLANT
ELECT REM PT RETURN 9FT ADLT (ELECTROSURGICAL) ×8
ELECTRODE REM PT RTRN 9FT ADLT (ELECTROSURGICAL) ×4 IMPLANT
FELT TEFLON 1X6 (MISCELLANEOUS) ×6 IMPLANT
GAUZE SPONGE 4X4 12PLY STRL (GAUZE/BANDAGES/DRESSINGS) ×8 IMPLANT
GAUZE SPONGE 4X4 12PLY STRL LF (GAUZE/BANDAGES/DRESSINGS) ×6 IMPLANT
GLOVE BIO SURGEON STRL SZ 6 (GLOVE) IMPLANT
GLOVE BIO SURGEON STRL SZ 6.5 (GLOVE) ×2 IMPLANT
GLOVE BIO SURGEON STRL SZ7 (GLOVE) IMPLANT
GLOVE BIO SURGEON STRL SZ7.5 (GLOVE) ×4 IMPLANT
GLOVE BIO SURGEONS STRL SZ 6.5 (GLOVE) ×2
GLOVE BIOGEL PI IND STRL 6 (GLOVE) IMPLANT
GLOVE BIOGEL PI IND STRL 6.5 (GLOVE) IMPLANT
GLOVE BIOGEL PI IND STRL 7.0 (GLOVE) IMPLANT
GLOVE BIOGEL PI INDICATOR 6 (GLOVE) ×8
GLOVE BIOGEL PI INDICATOR 6.5 (GLOVE) ×2
GLOVE BIOGEL PI INDICATOR 7.0 (GLOVE)
GLOVE EUDERMIC 7 POWDERFREE (GLOVE) ×8 IMPLANT
GLOVE ORTHO TXT STRL SZ7.5 (GLOVE) IMPLANT
GOWN STRL REUS W/ TWL LRG LVL3 (GOWN DISPOSABLE) ×8 IMPLANT
GOWN STRL REUS W/ TWL XL LVL3 (GOWN DISPOSABLE) ×2 IMPLANT
GOWN STRL REUS W/TWL LRG LVL3 (GOWN DISPOSABLE) ×24
GOWN STRL REUS W/TWL XL LVL3 (GOWN DISPOSABLE) ×4
HEMOSTAT POWDER SURGIFOAM 1G (HEMOSTASIS) ×12 IMPLANT
HEMOSTAT SURGICEL 2X14 (HEMOSTASIS) ×4 IMPLANT
INSERT FOGARTY 61MM (MISCELLANEOUS) IMPLANT
INSERT FOGARTY XLG (MISCELLANEOUS) ×2 IMPLANT
KIT BASIN OR (CUSTOM PROCEDURE TRAY) ×4 IMPLANT
KIT CATH CPB BARTLE (MISCELLANEOUS) ×4 IMPLANT
KIT ROOM TURNOVER OR (KITS) ×4 IMPLANT
KIT SUCTION CATH 14FR (SUCTIONS) ×4 IMPLANT
KIT VASOVIEW HEMOPRO VH 3000 (KITS) ×4 IMPLANT
NS IRRIG 1000ML POUR BTL (IV SOLUTION) ×22 IMPLANT
PACK E OPEN HEART (SUTURE) ×4 IMPLANT
PACK OPEN HEART (CUSTOM PROCEDURE TRAY) ×4 IMPLANT
PAD ARMBOARD 7.5X6 YLW CONV (MISCELLANEOUS) ×8 IMPLANT
PAD CARDIAC INSULATION (MISCELLANEOUS) ×2 IMPLANT
PAD ELECT DEFIB RADIOL ZOLL (MISCELLANEOUS) ×4 IMPLANT
PENCIL BUTTON HOLSTER BLD 10FT (ELECTRODE) ×4 IMPLANT
PUNCH AORTIC ROTATE 4.0MM (MISCELLANEOUS) IMPLANT
PUNCH AORTIC ROTATE 4.5MM 8IN (MISCELLANEOUS) ×4 IMPLANT
PUNCH AORTIC ROTATE 5MM 8IN (MISCELLANEOUS) IMPLANT
SET CARDIOPLEGIA MPS 5001102 (MISCELLANEOUS) ×2 IMPLANT
SPONGE INTESTINAL PEANUT (DISPOSABLE) IMPLANT
SPONGE LAP 18X18 X RAY DECT (DISPOSABLE) ×2 IMPLANT
SPONGE LAP 4X18 X RAY DECT (DISPOSABLE) ×4 IMPLANT
SUT BONE WAX W31G (SUTURE) ×4 IMPLANT
SUT MNCRL AB 4-0 PS2 18 (SUTURE) IMPLANT
SUT PROLENE 3 0 SH DA (SUTURE) IMPLANT
SUT PROLENE 3 0 SH1 36 (SUTURE) ×4 IMPLANT
SUT PROLENE 4 0 RB 1 (SUTURE)
SUT PROLENE 4 0 SH DA (SUTURE) IMPLANT
SUT PROLENE 4-0 RB1 .5 CRCL 36 (SUTURE) IMPLANT
SUT PROLENE 5 0 C 1 36 (SUTURE) IMPLANT
SUT PROLENE 6 0 C 1 30 (SUTURE) ×4 IMPLANT
SUT PROLENE 7 0 BV 1 (SUTURE) IMPLANT
SUT PROLENE 7 0 BV1 MDA (SUTURE) ×4 IMPLANT
SUT PROLENE 8 0 BV175 6 (SUTURE) IMPLANT
SUT SILK  1 MH (SUTURE) ×2
SUT SILK 1 MH (SUTURE) IMPLANT
SUT SILK 2 0 SH CR/8 (SUTURE) ×2 IMPLANT
SUT STEEL STERNAL CCS#1 18IN (SUTURE) IMPLANT
SUT STEEL SZ 6 DBL 3X14 BALL (SUTURE) ×6 IMPLANT
SUT VIC AB 1 CTX 36 (SUTURE) ×8
SUT VIC AB 1 CTX36XBRD ANBCTR (SUTURE) ×4 IMPLANT
SUT VIC AB 2-0 CT1 27 (SUTURE) ×8
SUT VIC AB 2-0 CT1 TAPERPNT 27 (SUTURE) IMPLANT
SUT VIC AB 2-0 CTX 27 (SUTURE) IMPLANT
SUT VIC AB 3-0 SH 27 (SUTURE)
SUT VIC AB 3-0 SH 27X BRD (SUTURE) IMPLANT
SUT VIC AB 3-0 X1 27 (SUTURE) ×4 IMPLANT
SUT VICRYL 4-0 PS2 18IN ABS (SUTURE) IMPLANT
SYSTEM SAHARA CHEST DRAIN ATS (WOUND CARE) ×6 IMPLANT
TAPE CLOTH SURG 4X10 WHT LF (GAUZE/BANDAGES/DRESSINGS) ×6 IMPLANT
TAPE PAPER 2X10 WHT MICROPORE (GAUZE/BANDAGES/DRESSINGS) ×2 IMPLANT
TOWEL GREEN STERILE (TOWEL DISPOSABLE) ×10 IMPLANT
TOWEL GREEN STERILE FF (TOWEL DISPOSABLE) ×6 IMPLANT
TOWEL OR 17X24 6PK STRL BLUE (TOWEL DISPOSABLE) ×2 IMPLANT
TOWEL OR 17X26 10 PK STRL BLUE (TOWEL DISPOSABLE) ×2 IMPLANT
TRAY FOLEY SILVER 16FR TEMP (SET/KITS/TRAYS/PACK) ×4 IMPLANT
TUBING INSUFFLATION (TUBING) ×4 IMPLANT
UNDERPAD 30X30 (UNDERPADS AND DIAPERS) ×4 IMPLANT
WATER STERILE IRR 1000ML POUR (IV SOLUTION) ×8 IMPLANT

## 2017-04-08 NOTE — Anesthesia Procedure Notes (Signed)
Procedure Name: Intubation Date/Time: 04/08/2017 7:48 AM Performed by: Candis Shine, CRNA Pre-anesthesia Checklist: Patient identified, Emergency Drugs available, Suction available and Patient being monitored Patient Re-evaluated:Patient Re-evaluated prior to induction Oxygen Delivery Method: Circle System Utilized Preoxygenation: Pre-oxygenation with 100% oxygen Induction Type: IV induction Ventilation: Mask ventilation without difficulty and Oral airway inserted - appropriate to patient size Laryngoscope Size: Mac and 4 Grade View: Grade I Tube type: Subglottic suction tube Tube size: 8.0 mm Number of attempts: 1 Airway Equipment and Method: Stylet and Oral airway Placement Confirmation: ETT inserted through vocal cords under direct vision,  positive ETCO2 and breath sounds checked- equal and bilateral Secured at: 22 cm Tube secured with: Tape Dental Injury: Teeth and Oropharynx as per pre-operative assessment

## 2017-04-08 NOTE — Anesthesia Procedure Notes (Signed)
Procedures

## 2017-04-08 NOTE — Progress Notes (Signed)
Critical WBC called to Tamera Punt RN on 2H.

## 2017-04-08 NOTE — Op Note (Signed)
CARDIOVASCULAR SURGERY OPERATIVE NOTE  04/08/2017  Surgeon:  [name], MD  First Assistant: Jadene Pierini,  PA-C   Preoperative Diagnosis:  Severe multi-vessel coronary artery disease   Postoperative Diagnosis:  Same   Procedure:  1. Median Sternotomy 2. Extracorporeal circulation 3.   Coronary artery bypass grafting x 2   Left internal mammary graft to the LAD  SVG to OM   4.   Endoscopic vein harvest from both legs   Anesthesia:  General Endotracheal   Clinical History/Surgical Indication:  The patient is a 71 year old gentleman with a family history of premature CAD, active heavy smoking, DM and HTN who reports being in his usual state of health until last Thursday when he started feeling poorly with chills, fever and thought he had the flu. He started having some chest pain around the same time that was intermittent over the weekend and then yesterday presented to the ED with shortness of breath and persistent chest discomfort. He ruled in for STEMI with subtle ST elevation in V1 and V2 and marked ST elevation in V3 with diffuse ST depression. He had Q waves in the anteroseptal leads. His initial troponin poc was 5.67 and then the next troponin was 54. Cardiac cath showed an occluded proximal RCA with faint filling of the PDA and PL by collaterals. The LAD had 95% and 85% proximal stenoses within a heavily calcified and tortuous segment. The LAD is a large vessel that wraps the apex. The proximal to mid LCX has 70% stenosis. The LVEF was 25% with an LVEDP of 37 and PA sat of 52%, CI 1.86. He had an Impella CP inserted for hemodynamic support. After arriving in the ICU he had fever to 104, leukocytosis to 38.8, procalcitonin of 6.43, drop in SVR and sepsis was suspected. The only positive culture was 20K pseudomonas aeruginosa in his urine. He had some edema on CXR. It was felt that  possibly this all started with a viral infection that led to MI and heart failure in the setting of severe coronary artery disease. Over the past week his fever has resolved and WBC count has started decreasing. His Impella was removed and he has remained hemodynamically stable with some shortness of breath. It was felt that he was now stable enough for CABG. I discussed the operative procedure with the patient and his wife including alternatives, benefits and risks; including but not limited to bleeding, blood transfusion, infection, stroke, myocardial infarction, graft failure, heart block requiring a permanent pacemaker, organ dysfunction, and death.  [name] understands and agrees to proceed.      Preparation:  The patient was seen in the preoperative holding area and the correct patient, correct operation were confirmed with the patient after reviewing the medical record and catheterization. The consent was signed by me. Preoperative antibiotics were given. A pulmonary arterial line and radial arterial line were placed by the anesthesia team. The patient was taken back to the operating room and positioned supine on the operating room table. After being placed under general endotracheal anesthesia by the anesthesia team a foley catheter was placed. The neck, chest, abdomen, and both legs were prepped with betadine soap and solution and draped in the usual sterile manner. A surgical time-out was taken and the correct patient and operative procedure were confirmed with the nursing and anesthesia staff.  TEE performed by Dr. Lillia Abed:  This showed an LVEF of 25% with mild MR. RV function was normal. There was  no AI.  Cardiopulmonary Bypass:  A median sternotomy was performed. The pericardium was opened in the midline. Right ventricular function appeared normal. The ascending aorta was of normal size and had no palpable plaque. There were no contraindications to aortic cannulation or  cross-clamping. The patient was fully systemically heparinized and the ACT was maintained > 400 sec. The proximal aortic arch was cannulated with a 20 F aortic cannula for arterial inflow. Venous cannulation was performed via the right atrial appendage using a two-staged venous cannula. An antegrade cardioplegia/vent cannula was inserted into the mid-ascending aorta. Aortic occlusion was performed with a single cross-clamp. Systemic cooling to 32 degrees Centigrade and topical cooling of the heart with iced saline were used. Hyperkalemic antegrade cold blood cardioplegia was used to induce diastolic arrest and was then given at about 20 minute intervals throughout the period of arrest to maintain myocardial temperature at or below 10 degrees centigrade. A temperature probe was inserted into the interventricular septum and an insulating pad was placed in the pericardium.   Left internal mammary harvest:  The left side of the sternum was retracted using the Rultract retractor. The left internal mammary artery was harvested as a pedicle graft. All side branches were clipped. It was a medium-sized vessel of good quality with excellent blood flow. It was ligated distally and divided. It was sprayed with topical papaverine solution to prevent vasospasm.   Endoscopic vein harvest:  The right greater saphenous vein was harvested endoscopically through a 2 cm incision medial to the right knee. It was harvested from the upper thigh to below the knee. It was a small-sized vein of fair quality and not felt to be ideal. Therefore a second segment of saphenous vein was harvested from the left thigh and this vein was of medium size and good quality. The side branches were all ligated with 4-0 silk ties.    Coronary arteries:  The coronary arteries were examined. There was evidence of old anterior MI with scar present and thinning of the anterior wall.   LAD:  Severely and diffusely diseased with calcific plaque  that extended to the apex. There was only one focal area in the mid portion of the vessel that was suitable to open.  LCX:  Large OM with mild distal disease. The LCX terminated as a small PL branch.  RCA:  Occluded and not seen on cath and is a small non-dominant vessel that only gave off two small acute marginal branches.   Grafts:  1. LIMA to the LAD: 2.5 mm. It was sewn end to side using 8-0 prolene continuous suture. 2. SVG to OM:  2.0 mm. It was sewn end to side using 7-0 prolene continuous suture.   The proximal vein graft anastomosis was performed to the mid-ascending aorta using continuous 6-0 prolene suture. A graft marker was placed around the proximal anastomosis.   Completion:  The patient was rewarmed to 37 degrees Centigrade. The clamp was removed from the LIMA pedicle and there was rapid warming of the septum and return of ventricular fibrillation. The crossclamp was removed with a time of 50 minutes. There was spontaneous return of sinus rhythm. The distal and proximal anastomoses were checked for hemostasis. The position of the grafts was satisfactory. Two temporary epicardial pacing wires were placed on the right atrium and two on the right ventricle. The patient was weaned from CPB without difficulty on milrinone 0.3 and epi 3 mcg.  CPB time was 75 minutes. Cardiac output was 5 LPM.  TEE showed improved LV function. Heparin was fully reversed with protamine and the aortic and venous cannulas removed. Hemostasis was achieved. Mediastinal and left pleural drainage tubes were placed. The sternum was closed with double #6 stainless steel wires. The fascia was closed with continuous # 1 vicryl suture. The subcutaneous tissue was closed with 2-0 vicryl continuous suture. The skin was closed with 3-0 vicryl subcuticular suture. All sponge, needle, and instrument counts were reported correct at the end of the case. Dry sterile dressings were placed over the incisions and around the chest  tubes which were connected to pleurevac suction. The patient was then transported to the surgical intensive care unit in critical but stable condition.

## 2017-04-08 NOTE — Anesthesia Procedure Notes (Signed)
Central Venous Catheter Insertion Performed by: Catalina Gravel, MD, anesthesiologist Start/End12/01/2017 6:55 AM, 04/08/2017 7:00 AM Patient location: Pre-op. Preanesthetic checklist: patient identified, IV checked, site marked, risks and benefits discussed, surgical consent, monitors and equipment checked, pre-op evaluation, timeout performed and anesthesia consent Hand hygiene performed  and maximum sterile barriers used  Total catheter length 55. PA cath was placed.Swan type:thermodilution PA Cath depth:100 Procedure performed without using ultrasound guided technique. Attempts: 1 Patient tolerated the procedure well with no immediate complications.

## 2017-04-08 NOTE — Procedures (Signed)
Extubation Procedure Note  Patient Details:   Name: [name] [name] DOB: 04/05/1946 MRN: [id number]   Airway Documentation:     Evaluation  O2 sats: stable throughout Complications: No apparent complications Patient did tolerate procedure well. Bilateral Breath Sounds: Clear, Diminished   Yes   Positive cuff leak noted, NIF - 26, VC 1300 mL  Pt placed on [place] 4 L with humidity, no stridor noted, Pt able to reach 750 using incentive spirometer.  [place] Amber Mitch Arquette 04/08/2017, 6:12 PM

## 2017-04-08 NOTE — Progress Notes (Signed)
Advanced Heart Failure Rounding Note  Subjective:    Underwent CABG x 2 today with LIMA to LAD and SVG to OM.   Tolerated well. Now extubated. Awake but just received pain meds.   On Milrinone 0.3, NE 9, Epi 3 and Neo 80.   SBP 105  PAP 28/13 (20)  CO 5.2 L/min  Objective:   Weight Range: 95.2 kg (209 lb 14.1 oz) Body mass index is 26.95 kg/m.   Vital Signs:   Temp:  [96.4 F (35.8 C)-98.1 F (36.7 C)] 98.1 F (36.7 C) (12/10 1915) Pulse Rate:  [56-110] 110 (12/10 1915) Resp:  [12-27] 24 (12/10 1915) BP: (91-127)/(42-74) 108/56 (12/10 1900) SpO2:  [75 %-100 %] 91 % (12/10 1915) Arterial Line BP: (88-168)/(31-65) 118/51 (12/10 1915) FiO2 (%):  [40 %-50 %] 40 % (12/10 1647) Weight:  [95.2 kg (209 lb 14.1 oz)] 95.2 kg (209 lb 14.1 oz) (12/10 1300) Last BM Date: 04/06/17 Weight change: Filed Weights   04/07/17 0444 04/08/17 0600 04/08/17 1300  Weight: 101.4 kg (223 lb 8.7 oz) 95.2 kg (209 lb 14.1 oz) 95.2 kg (209 lb 14.1 oz)   Intake/Output:   Intake/Output Summary (Last 24 hours) at 04/08/2017 2039 Last data filed at 04/08/2017 1900 Gross per 24 hour  Intake 4194.67 ml  Output 2645 ml  Net 1549.67 ml    Physical Exam    General:  Lying in bed. Awake No resp difficulty HEENT: normal Neck: supple. RIJ swan  Carotids 2+ bilat; no bruits. No lymphadenopathy or thryomegaly appreciated. Cor: Sternal dressing intact  Tachy regular CTs in place  Lungs: coarse Abdomen: soft, nontender, + distended. No hepatosplenomegaly. No bruits or masses. Quiet Extremities: no cyanosis, clubbing, rash, wrapped  Neuro: awake and arousable moves all 4s without problem   Telemetry   ST 100-110 Personally reviewed   Labs    CBC Recent Labs    04/07/17 0500 04/08/17 0245  04/08/17 1242  04/08/17 1830 04/08/17 1847  WBC 25.1* 24.0*  --  54.2*  --  49.0*  --   NEUTROABS 22.6* 20.6*  --   --   --   --   --   HGB 10.2* 10.8*   < > 9.4*   < > 8.6* 9.2*  HCT 30.7* 30.9*    < > 28.6*   < > 25.0* 27.0*  MCV 86.5 87.3  --  88.8  --  88.0  --   PLT 310 407*   < > 396  --  389  --    < > = values in this interval not displayed.   Basic Metabolic Panel Recent Labs    04/07/17 0500 04/08/17 0245  04/08/17 1130 04/08/17 1250 04/08/17 1830 04/08/17 1847  NA 129* 130*   < > 134* 136  --  135  K 4.0 3.8   < > 4.3 3.8  --  3.7  CL 96* 95*   < > 95*  --   --  100*  CO2 24 24  --   --   --   --   --   GLUCOSE 218* 240*   < > 229* 202*  --  128*  BUN 30* 32*   < > 32*  --   --  30*  CREATININE 1.11 1.29*   < > 1.10  --  1.31* 1.20  CALCIUM 7.8* 7.8*  --   --   --   --   --   MG 2.4 2.2  --   --   --  3.4*  --   PHOS 3.3  --   --   --   --   --   --    < > = values in this interval not displayed.   Liver Function Tests No results for input(s): AST, ALT, ALKPHOS, BILITOT, PROT, ALBUMIN in the last 72 hours. No results for input(s): LIPASE, AMYLASE in the last 72 hours. Cardiac Enzymes No results for input(s): CKTOTAL, CKMB, CKMBINDEX, TROPONINI in the last 72 hours.  BNP: BNP (last 3 results) Recent Labs    03/31/17 2341  BNP 786.9*    ProBNP (last 3 results) No results for input(s): PROBNP in the last 8760 hours.   D-Dimer No results for input(s): DDIMER in the last 72 hours. Hemoglobin A1C No results for input(s): HGBA1C in the last 72 hours. Fasting Lipid Panel No results for input(s): CHOL, HDL, LDLCALC, TRIG, CHOLHDL, LDLDIRECT in the last 72 hours. Thyroid Function Tests No results for input(s): TSH, T4TOTAL, T3FREE, THYROIDAB in the last 72 hours.  Invalid input(s): FREET3  Other results:   Imaging    Dg Chest Port 1 View  Result Date: 04/08/2017 CLINICAL DATA:  Status post intubation EXAM: PORTABLE CHEST 1 VIEW COMPARISON:  04/07/2017 FINDINGS: Endotracheal tube with the tip 4.4 cm above the carina. Nasogastric tube coursing below the diaphragm. Right jugular Swan-Ganz catheter with its tip projecting over the right main  pulmonary artery. Right-sided PICC line with the tip projecting over the cavoatrial junction. Bilateral chest tubes. No pneumothorax. No pleural effusion or pneumothorax. Mild bilateral interstitial prominence. Stable cardiomediastinal silhouette. Interval CABG. No acute osseous abnormality. IMPRESSION: 1. Support lines and tubing in satisfactory position. 2. Bilateral chest tubes without a pneumothorax. 3. Interval CABG. Electronically Signed   By: Kathreen Devoid   On: 04/08/2017 13:08     Medications:     Scheduled Medications: . [START ON 04/09/2017] acetaminophen  1,000 mg Oral Q6H   Or  . [START ON 04/09/2017] acetaminophen (TYLENOL) oral liquid 160 mg/5 mL  1,000 mg Per Tube Q6H  . [START ON 04/09/2017] aspirin EC  325 mg Oral Daily   Or  . [START ON 04/09/2017] aspirin  324 mg Per Tube Daily  . atorvastatin  80 mg Oral QHS  . [START ON 04/09/2017] bisacodyl  10 mg Oral Daily   Or  . [START ON 04/09/2017] bisacodyl  10 mg Rectal Daily  . chlorhexidine gluconate (MEDLINE KIT)  15 mL Mouth Rinse BID  . Chlorhexidine Gluconate Cloth  6 each Topical Daily  . [START ON 04/09/2017] docusate sodium  200 mg Oral Daily  . insulin regular  0-10 Units Intravenous TID WC  . [START ON 04/09/2017] mouth rinse  15 mL Mouth Rinse QID  . [START ON 04/10/2017] pantoprazole  40 mg Oral Daily  . sodium chloride flush  10-40 mL Intracatheter Q12H  . [START ON 04/09/2017] sodium chloride flush  3 mL Intravenous Q12H    Infusions: . sodium chloride 20 mL/hr at 04/08/17 1900  . [START ON 04/09/2017] sodium chloride    . sodium chloride 10 mL/hr at 04/08/17 1900  . albumin human    . dexmedetomidine (PRECEDEX) IV infusion Stopped (04/08/17 1706)  . EPINEPHrine 4 mg in dextrose 5% 250 mL infusion (16 mcg/mL) 3 mcg/min (04/08/17 1900)  . famotidine (PEPCID) IV Stopped (04/08/17 1330)  . insulin (NOVOLIN-R) infusion 6 Units/hr (04/08/17 1957)  . lactated ringers    . lactated ringers    . lactated  ringers 20 mL/hr at  04/08/17 1900  . milrinone 0.3 mcg/kg/min (04/08/17 2019)  . nitroGLYCERIN Stopped (04/08/17 1333)  . norepinephrine (LEVOPHED) Adult infusion 9 mcg/min (04/08/17 1900)  . phenylephrine (NEO-SYNEPHRINE) Adult infusion 80 mcg/min (04/08/17 1900)  . piperacillin-tazobactam (ZOSYN)  IV 3.375 g (04/08/17 1649)  . potassium chloride    . vancomycin Stopped (04/08/17 0245)    PRN Medications: sodium chloride, albumin human, lactated ringers, midazolam, morphine injection, ondansetron (ZOFRAN) IV, oxyCODONE, [START ON 04/09/2017] sodium chloride flush, traMADol   Patient Profile   71 y/o with anterior STEMI who developed cardiogenic shock. 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 Impella placed for stabilization of shock.   S/p CABG with LIMA to LAD and SVG to OM on 04/08/17  Assessment/Plan   1. Cardiogenic shock - Impella removed 12/7. Swan out 12/8  - s/p CABG 12/10 - wean drips as tolerated - Swan numbers look good  2. Severe CAD - s/p CABG 12/10. Stable post-op  - Continue ASA and statin   3. Acute systolic CHF in setting of STEMI: Echo with EF 15-20%.  - Ischemic CMP.  - s/p CABG - Wean pressors as tolerated - Start diuresis in am  4. ID -> Sepsis unclear source.  - UCx with pseudomonas.  - BCx NGTD - Sputum culture negative - CCM treating for AECOPD - Wean steroids   5. DM2 - Continue SSI. No change.   6. Hypomagnesemia - Stable today.    7. Tobacco abuse - Smoked 1 ppd PTA for > 50 years.  - Will continue to encourage complete cessation. No change.   8. Hx of bladder CA - Pt states he had multiple rounds of Bacillus Calmette-Guerin (BCG) therapy. No change.   0. Scrotal swelling - [place] 04/04/17 with R hydrocele and ? Epididymitis. - Continue to follow fluid status.  On broad spectrum ABX already.     CRITICAL CARE Performed by: Glori Bickers  Total critical care time: 35  minutes  Critical care time was exclusive of separately billable procedures and treating other patients.  Critical care was necessary to treat or prevent imminent or life-threatening deterioration.  Critical care was time spent personally by me (independent of midlevel providers or residents) on the following activities: development of treatment plan with patient and/or surrogate as well as nursing, discussions with consultants, evaluation of patient's response to treatment, examination of patient, obtaining history from patient or surrogate, ordering and performing treatments and interventions, ordering and review of laboratory studies, ordering and review of radiographic studies, pulse oximetry and re-evaluation of patient's condition.   Length of Stay: [place], MD  04/08/2017, 8:39 PM  Advanced Heart Failure Team Pager [phone] (M-F; 7a - 4p)  Please contact [facility] for night-coverage after hours (4p -7a ) and weekends on [email or web address]

## 2017-04-08 NOTE — Anesthesia Procedure Notes (Signed)
Central Venous Catheter Insertion Performed by: Catalina Gravel, MD, anesthesiologist Start/End12/01/2017 6:45 AM, 04/08/2017 6:55 AM Patient location: Pre-op. Preanesthetic checklist: patient identified, IV checked, site marked, risks and benefits discussed, surgical consent, monitors and equipment checked, pre-op evaluation, timeout performed and anesthesia consent Position: Trendelenburg Lidocaine 1% used for infiltration and patient sedated Hand hygiene performed , maximum sterile barriers used  and Seldinger technique used Catheter size: 9 Fr Total catheter length 10. Central line was placed.MAC introducer Swan type:thermodilution PA Cath depth:50 Procedure performed using ultrasound guided technique. Ultrasound Notes:anatomy identified, needle tip was noted to be adjacent to the nerve/plexus identified, no ultrasound evidence of intravascular and/or intraneural injection and image(s) printed for medical record Attempts: 1 Following insertion, line sutured and dressing applied. Post procedure assessment: blood return through all ports, free fluid flow and no air  Patient tolerated the procedure well with no immediate complications.

## 2017-04-08 NOTE — Transfer of Care (Signed)
Immediate Anesthesia Transfer of Care Note  Patient: [name] [name]  Procedure(s) Performed: CORONARY ARTERY BYPASS GRAFTING (CABG) TIMES TWO USING LEFT INTERNAL MAMMARY ARTERY AND LEFT SAPHENOUS LEG VEIN HARVESTED ENDOSCOPICALLY.  LEG VEIN ALSO HARVESTED FROM THE RIGHT LEG (N/A Chest) TRANSESOPHAGEAL ECHOCARDIOGRAM (TEE) (N/A )  Patient Location: ICU  Anesthesia Type:General  Level of Consciousness: sedated and Patient remains intubated per anesthesia plan  Airway & Oxygen Therapy: Patient remains intubated per anesthesia plan and Patient placed on Ventilator (see vital sign flow sheet for setting)  Post-op Assessment: Report given to RN and Post -op Vital signs reviewed and stable  Post vital signs: Reviewed and stable  Last Vitals:  Vitals:   04/08/17 0500 04/08/17 0600  BP: 121/74 112/68  Pulse: 85   Resp: 14 (!) 23  Temp:    SpO2: 95%     Last Pain:  Vitals:   04/08/17 0400  TempSrc: Oral  PainSc:       Patients Stated Pain Goal: 2 (12[birth year] 1856)  Complications: No apparent anesthesia complications

## 2017-04-08 NOTE — Progress Notes (Signed)
  Echocardiogram Echocardiogram Transesophageal has been performed.  [name] [name] R 04/08/2017, 11:02 AM

## 2017-04-08 NOTE — OR Nursing (Signed)
1121 first call to sicu, 1150 second call to sicu

## 2017-04-09 ENCOUNTER — Inpatient Hospital Stay (HOSPITAL_COMMUNITY): Payer: Medicare Other

## 2017-04-09 ENCOUNTER — Encounter (HOSPITAL_COMMUNITY): Payer: Self-pay | Admitting: Surgery

## 2017-04-09 LAB — GLUCOSE, CAPILLARY
GLUCOSE-CAPILLARY: 114 mg/dL — AB (ref 65–99)
GLUCOSE-CAPILLARY: 121 mg/dL — AB (ref 65–99)
GLUCOSE-CAPILLARY: 122 mg/dL — AB (ref 65–99)
GLUCOSE-CAPILLARY: 235 mg/dL — AB (ref 65–99)
GLUCOSE-CAPILLARY: 258 mg/dL — AB (ref 65–99)
GLUCOSE-CAPILLARY: 94 mg/dL (ref 65–99)
Glucose-Capillary: 125 mg/dL — ABNORMAL HIGH (ref 65–99)
Glucose-Capillary: 112 mg/dL — ABNORMAL HIGH (ref 65–99)
Glucose-Capillary: 115 mg/dL — ABNORMAL HIGH (ref 65–99)
Glucose-Capillary: 115 mg/dL — ABNORMAL HIGH (ref 65–99)
Glucose-Capillary: 112 mg/dL — ABNORMAL HIGH (ref 65–99)
Glucose-Capillary: 121 mg/dL — ABNORMAL HIGH (ref 65–99)
Glucose-Capillary: 136 mg/dL — ABNORMAL HIGH (ref 65–99)

## 2017-04-09 LAB — HEMOGLOBIN FREE, PLASMA
HGB PLASMA: 0.2 mg/dL (ref 0.0–4.9)
Hgb, Plasma: 15.3 mg/dL — ABNORMAL HIGH (ref 0.0–4.9)
Hgb, Plasma: 3.1 mg/dL (ref 0.0–4.9)

## 2017-04-09 LAB — POCT I-STAT, CHEM 8
BUN: 38 mg/dL — ABNORMAL HIGH (ref 6–20)
CALCIUM ION: 0.99 mmol/L — AB (ref 1.15–1.40)
Chloride: 95 mmol/L — ABNORMAL LOW (ref 101–111)
Creatinine, Ser: 1.6 mg/dL — ABNORMAL HIGH (ref 0.61–1.24)
Glucose, Bld: 269 mg/dL — ABNORMAL HIGH (ref 65–99)
HEMATOCRIT: 25 % — AB (ref 39.0–52.0)
HEMOGLOBIN: 8.5 g/dL — AB (ref 13.0–17.0)
Potassium: 4.8 mmol/L (ref 3.5–5.1)
SODIUM: 129 mmol/L — AB (ref 135–145)
TCO2: 22 mmol/L (ref 22–32)

## 2017-04-09 LAB — CBC
HCT: 24.4 % — ABNORMAL LOW (ref 39.0–52.0)
HCT: 23.5 % — ABNORMAL LOW (ref 39.0–52.0)
HCT: 24.9 % — ABNORMAL LOW (ref 39.0–52.0)
HEMOGLOBIN: 8.2 g/dL — AB (ref 13.0–17.0)
HEMOGLOBIN: 7.9 g/dL — AB (ref 13.0–17.0)
HEMOGLOBIN: 8.6 g/dL — AB (ref 13.0–17.0)
MCH: 29.1 pg (ref 26.0–34.0)
MCH: 29.3 pg (ref 26.0–34.0)
MCH: 29.7 pg (ref 26.0–34.0)
MCHC: 33.6 g/dL (ref 30.0–36.0)
MCHC: 33.6 g/dL (ref 30.0–36.0)
MCHC: 34.5 g/dL (ref 30.0–36.0)
MCV: 86.5 fL (ref 78.0–100.0)
MCV: 87 fL (ref 78.0–100.0)
MCV: 85.9 fL (ref 78.0–100.0)
PLATELETS: 323 10*3/uL (ref 150–400)
Platelets: 386 10*3/uL (ref 150–400)
Platelets: 403 10*3/uL — ABNORMAL HIGH (ref 150–400)
RBC: 2.82 MIL/uL — AB (ref 4.22–5.81)
RBC: 2.7 MIL/uL — AB (ref 4.22–5.81)
RBC: 2.9 MIL/uL — AB (ref 4.22–5.81)
RDW: 14 % (ref 11.5–15.5)
RDW: 14.3 % (ref 11.5–15.5)
RDW: 14.1 % (ref 11.5–15.5)
WBC: 41 10*3/uL — ABNORMAL HIGH (ref 4.0–10.5)
WBC: 44.8 10*3/uL — ABNORMAL HIGH (ref 4.0–10.5)
WBC: 44.1 10*3/uL — AB (ref 4.0–10.5)

## 2017-04-09 LAB — BASIC METABOLIC PANEL
ANION GAP: 8 (ref 5–15)
BUN: 34 mg/dL — ABNORMAL HIGH (ref 6–20)
CO2: 22 mmol/L (ref 22–32)
Calcium: 7 mg/dL — ABNORMAL LOW (ref 8.9–10.3)
Chloride: 101 mmol/L (ref 101–111)
Creatinine, Ser: 1.34 mg/dL — ABNORMAL HIGH (ref 0.61–1.24)
GFR calc non Af Amer: 52 mL/min — ABNORMAL LOW (ref >60)
GFR, EST AFRICAN AMERICAN: 60 mL/min — AB (ref >60)
Glucose, Bld: 114 mg/dL — ABNORMAL HIGH (ref 65–99)
POTASSIUM: 4.3 mmol/L (ref 3.5–5.1)
SODIUM: 131 mmol/L — AB (ref 135–145)

## 2017-04-09 LAB — CREATININE, SERUM
CREATININE: 1.49 mg/dL — AB (ref 0.61–1.24)
CREATININE: 1.73 mg/dL — AB (ref 0.61–1.24)
GFR calc Af Amer: 53 mL/min — ABNORMAL LOW (ref >60)
GFR calc non Af Amer: 45 mL/min — ABNORMAL LOW (ref >60)
GFR, EST AFRICAN AMERICAN: 44 mL/min — AB (ref >60)
GFR, EST NON AFRICAN AMERICAN: 38 mL/min — AB (ref >60)

## 2017-04-09 LAB — CALCIUM, IONIZED
Calcium, Ionized, Serum: 4.3 mg/dL — ABNORMAL LOW (ref 4.5–5.6)
Calcium, Ionized, Serum: 4.3 mg/dL — ABNORMAL LOW (ref 4.5–5.6)

## 2017-04-09 LAB — MAGNESIUM
MAGNESIUM: 3.1 mg/dL — AB (ref 1.7–2.4)
Magnesium: 2.9 mg/dL — ABNORMAL HIGH (ref 1.7–2.4)

## 2017-04-09 LAB — PREPARE RBC (CROSSMATCH)

## 2017-04-09 MED ORDER — INSULIN ASPART 100 UNIT/ML ~~LOC~~ SOLN
0.0000 [IU] | SUBCUTANEOUS | Status: DC
  Administered 2017-04-09: 2 [IU] via SUBCUTANEOUS
  Administered 2017-04-09: 12 [IU] via SUBCUTANEOUS
  Administered 2017-04-09: 8 [IU] via SUBCUTANEOUS
  Administered 2017-04-09 – 2017-04-10 (×5): 2 [IU] via SUBCUTANEOUS
  Administered 2017-04-10: 4 [IU] via SUBCUTANEOUS
  Administered 2017-04-10 – 2017-04-11 (×2): 2 [IU] via SUBCUTANEOUS

## 2017-04-09 MED ORDER — SODIUM CHLORIDE 0.9 % IV SOLN
Freq: Once | INTRAVENOUS | Status: AC
  Administered 2017-04-09: 10:00:00 via INTRAVENOUS

## 2017-04-09 MED ORDER — INSULIN DETEMIR 100 UNIT/ML ~~LOC~~ SOLN
20.0000 [IU] | Freq: Every day | SUBCUTANEOUS | Status: DC
  Administered 2017-04-10 – 2017-04-17 (×8): 20 [IU] via SUBCUTANEOUS
  Filled 2017-04-09 (×9): qty 0.2

## 2017-04-09 MED ORDER — ORAL CARE MOUTH RINSE
15.0000 mL | Freq: Two times a day (BID) | OROMUCOSAL | Status: DC
  Administered 2017-04-09 – 2017-04-18 (×12): 15 mL via OROMUCOSAL

## 2017-04-09 MED ORDER — AMIODARONE HCL IN DEXTROSE 360-4.14 MG/200ML-% IV SOLN
INTRAVENOUS | Status: AC
  Administered 2017-04-09: 60 mg/h via INTRAVENOUS
  Filled 2017-04-09: qty 200

## 2017-04-09 MED ORDER — AMIODARONE HCL IN DEXTROSE 360-4.14 MG/200ML-% IV SOLN
30.0000 mg/h | INTRAVENOUS | Status: DC
  Administered 2017-04-09 – 2017-04-13 (×8): 30 mg/h via INTRAVENOUS
  Filled 2017-04-09 (×10): qty 200

## 2017-04-09 MED ORDER — ENOXAPARIN SODIUM 40 MG/0.4ML ~~LOC~~ SOLN
40.0000 mg | Freq: Every day | SUBCUTANEOUS | Status: DC
  Administered 2017-04-09 – 2017-04-14 (×6): 40 mg via SUBCUTANEOUS
  Filled 2017-04-09 (×6): qty 0.4

## 2017-04-09 MED ORDER — AMIODARONE HCL IN DEXTROSE 360-4.14 MG/200ML-% IV SOLN
60.0000 mg/h | INTRAVENOUS | Status: AC
  Administered 2017-04-09: 60 mg/h via INTRAVENOUS
  Filled 2017-04-09: qty 200

## 2017-04-09 MED ORDER — INSULIN DETEMIR 100 UNIT/ML ~~LOC~~ SOLN
30.0000 [IU] | Freq: Once | SUBCUTANEOUS | Status: AC
  Administered 2017-04-09: 30 [IU] via SUBCUTANEOUS
  Filled 2017-04-09 (×2): qty 0.3

## 2017-04-09 MED ORDER — AMIODARONE LOAD VIA INFUSION
150.0000 mg | Freq: Once | INTRAVENOUS | Status: AC
  Administered 2017-04-09: 150 mg via INTRAVENOUS
  Filled 2017-04-09: qty 83.34

## 2017-04-09 NOTE — Progress Notes (Signed)
Advanced Heart Failure Rounding Note  Subjective:    POD #1 CABG with LIMA to LAD and SVG to OM (12/10)  On epi 3, NE 3, milrinone 0.3  Feels ok. Chest sore. Urine output sluggish.   SBP 110  PAP 27/14   CO 5.8 L/min  Objective:   Weight Range: 96.4 kg (212 lb 8.4 oz) Body mass index is 27.29 kg/m.   Vital Signs:   Temp:  [96.4 F (35.8 C)-98.4 F (36.9 C)] 97.9 F (36.6 C) (12/11 0500) Pulse Rate:  [97-119] 112 (12/11 0500) Resp:  [12-29] 15 (12/11 0700) BP: (88-119)/(42-58) 101/49 (12/11 0700) SpO2:  [89 %-100 %] 90 % (12/11 0500) Arterial Line BP: (55-140)/(31-67) 77/47 (12/11 0700) FiO2 (%):  [40 %-50 %] 40 % (12/10 1647) Weight:  [95.2 kg (209 lb 14.1 oz)-96.4 kg (212 lb 8.4 oz)] 96.4 kg (212 lb 8.4 oz) (12/11 0500) Last BM Date: 04/06/17 Weight change: Filed Weights   04/08/17 0600 04/08/17 1300 04/09/17 0500  Weight: 95.2 kg (209 lb 14.1 oz) 95.2 kg (209 lb 14.1 oz) 96.4 kg (212 lb 8.4 oz)   Intake/Output:   Intake/Output Summary (Last 24 hours) at 04/09/2017 0757 Last data filed at 04/09/2017 0700 Gross per 24 hour  Intake 6662.23 ml  Output 2238 ml  Net 4424.23 ml    Physical Exam    General:  Lying in bed. No resp difficulty HEENT: normal Neck: supple. no JVD. Carotids 2+ bilat; no bruits. No lymphadenopathy or thryomegaly appreciated. Cor: Sternal dressing intact PMI nondisplaced. Regular rate & rhythm. No rubs, gallops or murmurs. Lungs: clear with decreased BS Abdomen: soft, nontender, mildly distended. No hepatosplenomegaly. No bruits or masses. Hypoactive bowel sounds. Extremities: no cyanosis, clubbing, rash, mild edema  SVG sites ok  Neuro: alert & orientedx3, cranial nerves grossly intact. moves all 4 extremities w/o difficulty. Affect pleasant   Telemetry   ST 100-115 Personally reviewed   Labs    CBC Recent Labs    04/07/17 0500 04/08/17 0245  04/08/17 1830 04/08/17 1847 04/09/17 0230  WBC 25.1* 24.0*   < > 49.0*  --   41.0*  NEUTROABS 22.6* 20.6*  --   --   --   --   HGB 10.2* 10.8*   < > 8.6* 9.2* 8.2*  HCT 30.7* 30.9*   < > 25.0* 27.0* 24.4*  MCV 86.5 87.3   < > 88.0  --  86.5  PLT 310 407*   < > 389  --  386   < > = values in this interval not displayed.   Basic Metabolic Panel Recent Labs    04/07/17 0500 04/08/17 0245  04/08/17 1830 04/08/17 1847 04/09/17 0230  NA 129* 130*   < >  --  135 131*  K 4.0 3.8   < >  --  3.7 4.3  CL 96* 95*   < >  --  100* 101  CO2 24 24  --   --   --  22  GLUCOSE 218* 240*   < >  --  128* 114*  BUN 30* 32*   < >  --  30* 34*  CREATININE 1.11 1.29*   < > 1.31* 1.20 1.34*  CALCIUM 7.8* 7.8*  --   --   --  7.0*  MG 2.4 2.2  --  3.4*  --  3.1*  PHOS 3.3  --   --   --   --   --    < > =  values in this interval not displayed.   Liver Function Tests No results for input(s): AST, ALT, ALKPHOS, BILITOT, PROT, ALBUMIN in the last 72 hours. No results for input(s): LIPASE, AMYLASE in the last 72 hours. Cardiac Enzymes No results for input(s): CKTOTAL, CKMB, CKMBINDEX, TROPONINI in the last 72 hours.  BNP: BNP (last 3 results) Recent Labs    03/31/17 2341  BNP 786.9*    ProBNP (last 3 results) No results for input(s): PROBNP in the last 8760 hours.   D-Dimer No results for input(s): DDIMER in the last 72 hours. Hemoglobin A1C No results for input(s): HGBA1C in the last 72 hours. Fasting Lipid Panel No results for input(s): CHOL, HDL, LDLCALC, TRIG, CHOLHDL, LDLDIRECT in the last 72 hours. Thyroid Function Tests No results for input(s): TSH, T4TOTAL, T3FREE, THYROIDAB in the last 72 hours.  Invalid input(s): FREET3  Other results:   Imaging    Dg Chest Port 1 View  Result Date: 04/09/2017 CLINICAL DATA:  Status post extubation EXAM: PORTABLE CHEST 1 VIEW COMPARISON:  April 08, 2017 FINDINGS: Endotracheal tube has been removed. Swan-Ganz catheter tip is in the proximal right main pulmonary artery. There is a chest tube on each side as well as  a mediastinal drain. Nasogastric tube is been removed. Temporary pacemaker wires are attached to the right heart. No pneumothorax. Interstitium remains diffusely prominent without change. There is atelectasis in the bases. No new opacity. There is cardiomegaly with pulmonary vascularity within normal limits. There is aortic atherosclerosis. No adenopathy. No evident bone lesions. IMPRESSION: Tube and catheter positions as described without pneumothorax. Diffuse interstitial prominence remains stable with stable bibasilar atelectasis. No new opacity. Stable cardiac prominence. There is aortic atherosclerosis. Aortic Atherosclerosis (ICD10-I70.0). Electronically Signed   By: Lowella Grip III M.D.   On: 04/09/2017 07:26   Dg Chest Port 1 View  Result Date: 04/08/2017 CLINICAL DATA:  Status post intubation EXAM: PORTABLE CHEST 1 VIEW COMPARISON:  04/07/2017 FINDINGS: Endotracheal tube with the tip 4.4 cm above the carina. Nasogastric tube coursing below the diaphragm. Right jugular Swan-Ganz catheter with its tip projecting over the right main pulmonary artery. Right-sided PICC line with the tip projecting over the cavoatrial junction. Bilateral chest tubes. No pneumothorax. No pleural effusion or pneumothorax. Mild bilateral interstitial prominence. Stable cardiomediastinal silhouette. Interval CABG. No acute osseous abnormality. IMPRESSION: 1. Support lines and tubing in satisfactory position. 2. Bilateral chest tubes without a pneumothorax. 3. Interval CABG. Electronically Signed   By: Kathreen Devoid   On: 04/08/2017 13:08     Medications:     Scheduled Medications: . acetaminophen  1,000 mg Oral Q6H   Or  . acetaminophen (TYLENOL) oral liquid 160 mg/5 mL  1,000 mg Per Tube Q6H  . aspirin EC  325 mg Oral Daily   Or  . aspirin  324 mg Per Tube Daily  . atorvastatin  80 mg Oral QHS  . bisacodyl  10 mg Oral Daily   Or  . bisacodyl  10 mg Rectal Daily  . Chlorhexidine Gluconate Cloth  6 each  Topical Daily  . docusate sodium  200 mg Oral Daily  . insulin regular  0-10 Units Intravenous TID WC  . mouth rinse  15 mL Mouth Rinse BID  . [START ON 04/10/2017] pantoprazole  40 mg Oral Daily  . sodium chloride flush  10-40 mL Intracatheter Q12H  . sodium chloride flush  3 mL Intravenous Q12H    Infusions: . sodium chloride 20 mL/hr at 04/08/17 2000  .  sodium chloride    . sodium chloride 10 mL/hr at 04/08/17 2000  . albumin human    . dexmedetomidine (PRECEDEX) IV infusion Stopped (04/08/17 1706)  . EPINEPHrine 4 mg in dextrose 5% 250 mL infusion (16 mcg/mL) 3 mcg/min (04/08/17 2000)  . insulin (NOVOLIN-R) infusion 4.2 Units/hr (04/09/17 0658)  . lactated ringers    . lactated ringers    . lactated ringers 20 mL/hr at 04/08/17 2000  . milrinone 0.3 mcg/kg/min (04/08/17 2019)  . nitroGLYCERIN Stopped (04/08/17 1333)  . norepinephrine (LEVOPHED) Adult infusion 3 mcg/min (04/09/17 3419)  . phenylephrine (NEO-SYNEPHRINE) Adult infusion 70 mcg/min (04/09/17 0234)  . piperacillin-tazobactam (ZOSYN)  IV 3.375 g (04/09/17 0224)  . vancomycin Stopped (04/09/17 0230)    PRN Medications: sodium chloride, albumin human, lactated ringers, midazolam, morphine injection, ondansetron (ZOFRAN) IV, oxyCODONE, sodium chloride flush, traMADol   Patient Profile   71 y/o with anterior STEMI who developed cardiogenic shock. 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 Impella placed for stabilization of shock.   S/p CABG with LIMA to LAD and SVG to OM on 04/08/17  Assessment/Plan   1. Cardiogenic shock - Impella removed 12/7. Swan out 12/8  - s/p CABG 12/10 - stable POD #1. CO stable on swan. Wean drips as tolerated starting with neo  2. Severe CAD - s/p CABG 12/10. Stable post-op. No ischemic symptoms  - Continue ASA and statin. Add b-blocker soon (when off drips)  3. Acute systolic CHF in setting of STEMI: Echo with EF 15-20%.  -  Ischemic CMP.  - s/p CABG - Wean pressors as tolerated. Start with neo  - Diurese gently as tolerated   4. ID -> Sepsis unclear source.  - UCx with pseudomonas.  - BCx NGTD - Sputum culture negative - CCM treating for AECOPD - Off steroids   5. DM2 - Continue SSI. No change.   6. Hypomagnesemia - Stable today.    7. Tobacco abuse - Smoked 1 ppd PTA for > 50 years.  - Understands need for smoking cessation   8. Hx of bladder CA - Pt states he had multiple rounds of Bacillus Calmette-Guerin (BCG) therapy. No change.   9. Scrotal swelling - [place] 04/04/17 with R hydrocele and ? Epididymitis. - Continue to follow fluid status.  On broad spectrum ABX already.     CRITICAL CARE Performed by: Glori Bickers  Total critical care time: 35 minutes  Critical care time was exclusive of separately billable procedures and treating other patients.  Critical care was necessary to treat or prevent imminent or life-threatening deterioration.  Critical care was time spent personally by me (independent of midlevel providers or residents) on the following activities: development of treatment plan with patient and/or surrogate as well as nursing, discussions with consultants, evaluation of patient's response to treatment, examination of patient, obtaining history from patient or surrogate, ordering and performing treatments and interventions, ordering and review of laboratory studies, ordering and review of radiographic studies, pulse oximetry and re-evaluation of patient's condition.    Length of Stay: [place], MD  04/09/2017, 7:57 AM  Advanced Heart Failure Team Pager [phone] (M-F; 7a - 4p)  Please contact [facility] for night-coverage after hours (4p -7a ) and weekends on [email or web address]

## 2017-04-09 NOTE — Anesthesia Postprocedure Evaluation (Signed)
Anesthesia Post Note  Patient: [name] [name]  Procedure(s) Performed: CORONARY ARTERY BYPASS GRAFTING (CABG) TIMES TWO USING LEFT INTERNAL MAMMARY ARTERY AND LEFT SAPHENOUS LEG VEIN HARVESTED ENDOSCOPICALLY.  LEG VEIN ALSO HARVESTED FROM THE RIGHT LEG (N/A Chest) TRANSESOPHAGEAL ECHOCARDIOGRAM (TEE) (N/A )     Patient location during evaluation: SICU Anesthesia Type: General Level of consciousness: sedated Pain management: pain level controlled Vital Signs Assessment: post-procedure vital signs reviewed and stable Respiratory status: patient remains intubated per anesthesia plan Cardiovascular status: stable Postop Assessment: no apparent nausea or vomiting Anesthetic complications: no    Last Vitals:  Vitals:   04/09/17 0653 04/09/17 0700  BP:  (!) 101/49  Pulse:    Resp: (!) 22 15  Temp:    SpO2:      Last Pain:  Vitals:   04/09/17 0653  TempSrc:   PainSc: 8                  [name] [name]

## 2017-04-09 NOTE — Progress Notes (Signed)
[facility] BRIEF SICU PROGRESS NOTE  1 Day Post-Op  S/P Procedure(s) (LRB): CORONARY ARTERY BYPASS GRAFTING (CABG) TIMES TWO USING LEFT INTERNAL MAMMARY ARTERY AND LEFT SAPHENOUS LEG VEIN HARVESTED ENDOSCOPICALLY.  LEG VEIN ALSO HARVESTED FROM THE RIGHT LEG (N/A) TRANSESOPHAGEAL ECHOCARDIOGRAM (TEE) (N/A)   Stable day NSR w/ stable hemodynamics, Neo off and Epi weaned to 1.0 mcg/min but still on levophed 5 and milrinone 0.3 Breathing comfortably w/ O2 sats 91-93% UOP 20-40 mL/hr Labs okay w/ Hgb up 8.6  Plan: Continue current plan  Rexene Alberts, MD 04/09/2017 8:44 PM

## 2017-04-09 NOTE — Progress Notes (Signed)
Pt went into Fast A. Fib. Rate of 175. VVI pacing turned back on. Dr. Cyndia Bent informed and Iv amiodarone started. EKG done

## 2017-04-09 NOTE — Progress Notes (Signed)
Pharmacy Antibiotic Note  [name] [name] is a 71 y.o. male admitted on 03/31/2017 with fever, flu-like symptoms.  Pharmacy had been consulted for vancomycin and zosyn dosing.  Respiratory virus panel negative.  Likely RUL PNA.  Afebrile post op.  WBC trended down pre op then elevated 54>40 post op.  20K/ml pseudomonas in urine.  Other cultures remain negative.   Last vancomycin trough 16 at goal (15-20), Cr 1.5 slight trend up Spoke to [facility] given elevated WBC and previous infection continue current abx for now - de-escalate/ stop when improved.  Plan: 1. Continue vancomycin 1250 mg IV q 12 hrs. 2. Zosyn 3.375g IV q 8 hrs (extended-interval infusion). 3. F/u renal function, cultures and clinical course.  Height: 6\' 2"  (188 cm) Weight: 212 lb 8.4 oz (96.4 kg) IBW/kg (Calculated) : 82.2  Temp (24hrs), Avg:97.8 F (36.6 C), Min:97.2 F (36.2 C), Max:98.4 F (36.9 C)  Recent Labs  Lab 04/03/17 1157  04/08/17 0245  04/08/17 1130 04/08/17 1242 04/08/17 1830 04/08/17 1847 04/09/17 0230 04/09/17 1020  WBC  --    < > 24.0*  --   --  54.2* 49.0*  --  41.0* 44.8*  CREATININE  --    < > 1.29*   < > 1.10  --  1.31* 1.20 1.34* 1.49*  VANCOTROUGH 16  --   --   --   --   --   --   --   --   --    < > = values in this interval not displayed.    Estimated Creatinine Clearance: 52.9 mL/min (A) (by C-G formula based on SCr of 1.49 mg/dL (H)).    No Known Allergies  Antimicrobials this admission:  12/3 Vanc > 12/3 Zosyn >   Dose adjustments this admission:  12/5 VT = 16 on 1250 q 12 hrs - con't same  Microbiology results:  12/4 Resp Cx: reincubated 12/3 BCx x 2: ngtd 12/3 UCx: 20,000/ml pseudomonas - pan-S 12/2 MRSA PCR: negative 12/2 Resp virus panel - neg Thank you for allowing pharmacy to be a part of this patient's care.  Bonnita Nasuti Pharm.D. CPP, BCPS Clinical Pharmacist [phone] 04/09/2017 3:05 PM

## 2017-04-09 NOTE — Progress Notes (Signed)
Pt. Went into AFib RVR in the 150s at 2200.  Dr. Cyndia Bent paged and ordered an Amio bolus as well as a fluid bolus.  Pt. Had similar episode earlier today and was started on IV Amio per protocol.  Pt. Blood pressure dropped to a MAP of 55 and O2 sats dropped as well.  Levo was titrated up and patient was placed on a non rebreather.   Amio bolus given and 500 mL LR given as well.  Pt. Is now ST and pressures have a MAP of 65.  Will continue to monitor closely.

## 2017-04-09 NOTE — Progress Notes (Signed)
1 Day Post-Op Procedure(s) (LRB): CORONARY ARTERY BYPASS GRAFTING (CABG) TIMES TWO USING LEFT INTERNAL MAMMARY ARTERY AND LEFT SAPHENOUS LEG VEIN HARVESTED ENDOSCOPICALLY.  LEG VEIN ALSO HARVESTED FROM THE RIGHT LEG (N/A) TRANSESOPHAGEAL ECHOCARDIOGRAM (TEE) (N/A) Subjective: Sore  Objective: Vital signs in last 24 hours: Temp:  [96.4 F (35.8 C)-98.4 F (36.9 C)] 97.9 F (36.6 C) (12/11 0500) Pulse Rate:  [97-119] 112 (12/11 0500) Cardiac Rhythm: Sinus tachycardia (12/11 0400) Resp:  [12-29] 15 (12/11 0700) BP: (88-119)/(42-58) 101/49 (12/11 0700) SpO2:  [89 %-100 %] 90 % (12/11 0500) Arterial Line BP: (55-140)/(31-67) 77/47 (12/11 0700) FiO2 (%):  [40 %-50 %] 40 % (12/10 1647) Weight:  [95.2 kg (209 lb 14.1 oz)-96.4 kg (212 lb 8.4 oz)] 96.4 kg (212 lb 8.4 oz) (12/11 0500)  Hemodynamic parameters for last 24 hours: PAP: (21-33)/(7-20) 27/13 CO:  [4.2 L/min-6.3 L/min] 5.9 L/min CI:  [1.9 L/min/m2-2.9 L/min/m2] 2.7 L/min/m2  Intake/Output from previous day: 12/10 0701 - 12/11 0700 In: 1914.7 [P.O.:720; I.V.:4500.2; Blood:192; IV Piggyback:1250] Out: 2238 [Urine:1290; Blood:450; Chest Tube:498] Intake/Output this shift: No intake/output data recorded.  General appearance: alert and cooperative Neurologic: intact Heart: regular rate and rhythm, S1, S2 normal, no murmur, click, rub or gallop Lungs: clear to auscultation bilaterally Extremities: edema moderate Wound: dressings dry  Lab Results: Recent Labs    04/08/17 1830 04/08/17 1847 04/09/17 0230  WBC 49.0*  --  41.0*  HGB 8.6* 9.2* 8.2*  HCT 25.0* 27.0* 24.4*  PLT 389  --  386   BMET:  Recent Labs    04/08/17 0245  04/08/17 1847 04/09/17 0230  NA 130*   < > 135 131*  K 3.8   < > 3.7 4.3  CL 95*   < > 100* 101  CO2 24  --   --  22  GLUCOSE 240*   < > 128* 114*  BUN 32*   < > 30* 34*  CREATININE 1.29*   < > 1.20 1.34*  CALCIUM 7.8*  --   --  7.0*   < > = values in this interval not displayed.     PT/INR:  Recent Labs    04/08/17 1242  LABPROT 16.5*  INR 1.35   ABG    Component Value Date/Time   PHART 7.398 04/08/2017 1843   HCO3 23.2 04/08/2017 1843   TCO2 24 04/08/2017 1847   ACIDBASEDEF 2.0 04/08/2017 1843   O2SAT 96.0 04/08/2017 1843   CBG (last 3)  Recent Labs    04/09/17 0608 04/09/17 0657 04/09/17 0804  GLUCAP 115* 112* 121*   CLINICAL DATA:  Status post extubation  EXAM: PORTABLE CHEST 1 VIEW  COMPARISON:  April 08, 2017  FINDINGS: Endotracheal tube has been removed. Swan-Ganz catheter tip is in the proximal right main pulmonary artery. There is a chest tube on each side as well as a mediastinal drain. Nasogastric tube is been removed. Temporary pacemaker wires are attached to the right heart. No pneumothorax.  Interstitium remains diffusely prominent without change. There is atelectasis in the bases. No new opacity. There is cardiomegaly with pulmonary vascularity within normal limits. There is aortic atherosclerosis. No adenopathy. No evident bone lesions.  IMPRESSION: Tube and catheter positions as described without pneumothorax. Diffuse interstitial prominence remains stable with stable bibasilar atelectasis. No new opacity. Stable cardiac prominence. There is aortic atherosclerosis.  Aortic Atherosclerosis (ICD10-I70.0).   Electronically Signed   By: Lowella Grip III M.D.   On: 04/09/2017 07:26   Assessment/Plan: S/P Procedure(s) (LRB): CORONARY  ARTERY BYPASS GRAFTING (CABG) TIMES TWO USING LEFT INTERNAL MAMMARY ARTERY AND LEFT SAPHENOUS LEG VEIN HARVESTED ENDOSCOPICALLY.  LEG VEIN ALSO HARVESTED FROM THE RIGHT LEG (N/A) TRANSESOPHAGEAL ECHOCARDIOGRAM (TEE) (N/A)  POD 1 He has been hemodynamically stable but requiring levophed and neo to maintain BP. CI 2.8 on epi and milrinone 0.3. Will wean epi and levophed as tolerated.  Expected acute postop blood loss anemia: Will transfuse a unit of PRBC's this am since he is  requiring pressors and PA pressures fairly low.  DC MT's and continue PT's  Volume excess: will have to wait until off pressors to diurese.  Leukocytosis: present preop and decreased to 24 but back up to 54K immediately postop. This must be a stress response. He did receive some steroids last week. Will continue vanc and Zosyn for now. I suspect he had a viral illness on admission with high fever and leukocytosis.   LOS: 9 days    [name] 04/09/2017

## 2017-04-10 ENCOUNTER — Inpatient Hospital Stay (HOSPITAL_COMMUNITY): Payer: Medicare Other

## 2017-04-10 LAB — CBC
HCT: 23.6 % — ABNORMAL LOW (ref 39.0–52.0)
HEMOGLOBIN: 8.2 g/dL — AB (ref 13.0–17.0)
MCH: 29.6 pg (ref 26.0–34.0)
MCHC: 34.7 g/dL (ref 30.0–36.0)
MCV: 85.2 fL (ref 78.0–100.0)
Platelets: 347 10*3/uL (ref 150–400)
RBC: 2.77 MIL/uL — AB (ref 4.22–5.81)
RDW: 14.1 % (ref 11.5–15.5)
WBC: 51.1 10*3/uL (ref 4.0–10.5)

## 2017-04-10 LAB — GLUCOSE, CAPILLARY
GLUCOSE-CAPILLARY: 129 mg/dL — AB (ref 65–99)
GLUCOSE-CAPILLARY: 135 mg/dL — AB (ref 65–99)
GLUCOSE-CAPILLARY: 155 mg/dL — AB (ref 65–99)
GLUCOSE-CAPILLARY: 166 mg/dL — AB (ref 65–99)
Glucose-Capillary: 146 mg/dL — ABNORMAL HIGH (ref 65–99)
Glucose-Capillary: 155 mg/dL — ABNORMAL HIGH (ref 65–99)

## 2017-04-10 LAB — BASIC METABOLIC PANEL
ANION GAP: 9 (ref 5–15)
BUN: 43 mg/dL — ABNORMAL HIGH (ref 6–20)
CHLORIDE: 96 mmol/L — AB (ref 101–111)
CO2: 21 mmol/L — AB (ref 22–32)
CREATININE: 1.73 mg/dL — AB (ref 0.61–1.24)
Calcium: 6.9 mg/dL — ABNORMAL LOW (ref 8.9–10.3)
GFR calc non Af Amer: 38 mL/min — ABNORMAL LOW (ref >60)
GFR, EST AFRICAN AMERICAN: 44 mL/min — AB (ref >60)
Glucose, Bld: 148 mg/dL — ABNORMAL HIGH (ref 65–99)
Potassium: 4.2 mmol/L (ref 3.5–5.1)
Sodium: 126 mmol/L — ABNORMAL LOW (ref 135–145)

## 2017-04-10 LAB — SAVE SMEAR

## 2017-04-10 MED ORDER — AMIODARONE LOAD VIA INFUSION
150.0000 mg | Freq: Once | INTRAVENOUS | Status: DC
  Filled 2017-04-10: qty 83.34

## 2017-04-10 MED ORDER — AMIODARONE IV BOLUS ONLY 150 MG/100ML: 150.0000 mg | Freq: Once | INTRAVENOUS | Status: DC

## 2017-04-10 MED ORDER — MILRINONE LACTATE IN DEXTROSE 20-5 MG/100ML-% IV SOLN
0.1250 ug/kg/min | INTRAVENOUS | Status: DC
  Administered 2017-04-10 – 2017-04-12 (×2): 0.125 ug/kg/min via INTRAVENOUS
  Filled 2017-04-10 (×2): qty 100

## 2017-04-10 MED ORDER — POTASSIUM CHLORIDE CRYS ER 20 MEQ PO TBCR
20.0000 meq | EXTENDED_RELEASE_TABLET | Freq: Every day | ORAL | Status: DC
  Administered 2017-04-10: 20 meq via ORAL
  Filled 2017-04-10 (×2): qty 1

## 2017-04-10 MED ORDER — NOREPINEPHRINE BITARTRATE 1 MG/ML IV SOLN
0.0000 ug/min | INTRAVENOUS | Status: DC
  Administered 2017-04-10: 7 ug/min via INTRAVENOUS
  Filled 2017-04-10: qty 16

## 2017-04-10 MED ORDER — FUROSEMIDE 10 MG/ML IJ SOLN
80.0000 mg | Freq: Two times a day (BID) | INTRAMUSCULAR | Status: DC
  Administered 2017-04-10 – 2017-04-11 (×3): 80 mg via INTRAVENOUS
  Filled 2017-04-10 (×3): qty 8

## 2017-04-10 MED ORDER — TRAMADOL HCL 50 MG PO TABS
50.0000 mg | ORAL_TABLET | Freq: Four times a day (QID) | ORAL | Status: DC | PRN
  Administered 2017-04-10 – 2017-04-17 (×7): 50 mg via ORAL
  Filled 2017-04-10 (×8): qty 1

## 2017-04-10 NOTE — Progress Notes (Deleted)
CRITICAL VALUE ALERT  Critical Value:  Troponin 0.69 Date & Time Notied: 04/10/17 2250  Provider Notified: MD Gilford Raid  MD reported that elevated troponin likely due to recent cardiac surgery; no orders received.

## 2017-04-10 NOTE — Progress Notes (Signed)
Advanced Heart Failure Rounding Note  Subjective:    POD #2 CABG with LIMA to LAD and SVG to OM (12/10)  Remains on Epi 1. Milrinone 0.25 and levophed 9  WBC 51k.   Feels sluggish. On high-flow O2. Mildly SOB but better than yesterday. Creatinine stable at 1.7   Objective:   Weight Range: 105.6 kg (232 lb 12.9 oz) Body mass index is 29.89 kg/m.   Vital Signs:   Temp:  [97 F (36.1 C)-98.2 F (36.8 C)] 97 F (36.1 C) (12/12 0600) Pulse Rate:  [59-155] 97 (12/12 0700) Resp:  [8-25] 9 (12/12 0700) BP: (75-133)/(40-84) 112/55 (12/12 0700) SpO2:  [79 %-95 %] 95 % (12/12 0700) Arterial Line BP: (83-148)/(33-57) 128/41 (12/12 0700) Weight:  [105.6 kg (232 lb 12.9 oz)] 105.6 kg (232 lb 12.9 oz) (12/12 0500) Last BM Date: 04/06/17 Weight change: Filed Weights   04/08/17 1300 04/09/17 0500 04/10/17 0500  Weight: 95.2 kg (209 lb 14.1 oz) 96.4 kg (212 lb 8.4 oz) 105.6 kg (232 lb 12.9 oz)   Intake/Output:   Intake/Output Summary (Last 24 hours) at 04/10/2017 0906 Last data filed at 04/10/2017 0700 Gross per 24 hour  Intake 4736.12 ml  Output 945 ml  Net 3791.12 ml    Physical Exam    General:  Lying in bed. Fatigued appearing  No resp difficulty HEENT: normal Neck: supple. RIJ introducer. JVP elevated . Carotids 2+ bilat; no bruits. No lymphadenopathy or thryomegaly appreciated. Cor: PMI nondisplaced. Regular rate & rhythm. No rubs, gallops or murmurs. Lungs: coarse. Decreased throughout  Abdomen: soft, nontender, nondistended. No hepatosplenomegaly. No bruits or masses. Good bowel sounds. Extremities: no cyanosis, clubbing, rash, 2+ edema  RUE PICC Neuro: alert & orientedx3, cranial nerves grossly intact. moves all 4 extremities w/o difficulty. Affect pleasant   Telemetry   NSR 90-100. Personally reviewed  Labs    CBC Recent Labs    04/08/17 0245  04/09/17 1723 04/09/17 1730 04/10/17 0353  WBC 24.0*   < > 44.1*  --  51.1*  NEUTROABS 20.6*  --   --   --    --   HGB 10.8*   < > 8.6* 8.5* 8.2*  HCT 30.9*   < > 24.9* 25.0* 23.6*  MCV 87.3   < > 85.9  --  85.2  PLT 407*   < > 323  --  347   < > = values in this interval not displayed.   Basic Metabolic Panel Recent Labs    04/09/17 0230  04/09/17 1723 04/09/17 1730 04/10/17 0353  NA 131*  --   --  129* 126*  K 4.3  --   --  4.8 4.2  CL 101  --   --  95* 96*  CO2 22  --   --   --  21*  GLUCOSE 114*  --   --  269* 148*  BUN 34*  --   --  38* 43*  CREATININE 1.34*   < > 1.73* 1.60* 1.73*  CALCIUM 7.0*  --   --   --  6.9*  MG 3.1*  --  2.9*  --   --    < > = values in this interval not displayed.   Liver Function Tests No results for input(s): AST, ALT, ALKPHOS, BILITOT, PROT, ALBUMIN in the last 72 hours. No results for input(s): LIPASE, AMYLASE in the last 72 hours. Cardiac Enzymes No results for input(s): CKTOTAL, CKMB, CKMBINDEX, TROPONINI in the last 72  hours.  BNP: BNP (last 3 results) Recent Labs    03/31/17 2341  BNP 786.9*    ProBNP (last 3 results) No results for input(s): PROBNP in the last 8760 hours.   D-Dimer No results for input(s): DDIMER in the last 72 hours. Hemoglobin A1C No results for input(s): HGBA1C in the last 72 hours. Fasting Lipid Panel No results for input(s): CHOL, HDL, LDLCALC, TRIG, CHOLHDL, LDLDIRECT in the last 72 hours. Thyroid Function Tests No results for input(s): TSH, T4TOTAL, T3FREE, THYROIDAB in the last 72 hours.  Invalid input(s): FREET3  Other results:   Imaging    Dg Chest Port 1 View  Result Date: 04/10/2017 CLINICAL DATA:  71 year old male postoperative day 2 status post CABG. EXAM: PORTABLE CHEST 1 VIEW COMPARISON:  04/09/2017 and earlier. FINDINGS: AP view of the chest 0605 hours. Stable right IJ Swan-Ganz catheter, tip at the right main pulmonary artery level. Two bilateral chest tubes remain in place. A mediastinal tube and left basilar chest or mediastinal tube are no longer identified. Stable right PICC line.  Stable cardiac size and mediastinal contours. Visualized tracheal air column is within normal limits. No pneumothorax. Bilateral upper lobe predominant increased interstitial markings are stable from preoperative exams. No pleural effusion or confluent pulmonary opacity identified. IMPRESSION: 1. Bilateral chest tubes remain in place. No pneumothorax. Stable right IJ approach Swan-Ganz catheter. 2. Suspected chronic pulmonary interstitial changes. No acute cardiopulmonary abnormality identified. Electronically Signed   By: Genevie Ann M.D.   On: 04/10/2017 07:57     Medications:     Scheduled Medications: . acetaminophen  1,000 mg Oral Q6H   Or  . acetaminophen (TYLENOL) oral liquid 160 mg/5 mL  1,000 mg Per Tube Q6H  . amiodarone  150 mg Intravenous Once  . aspirin EC  325 mg Oral Daily   Or  . aspirin  324 mg Per Tube Daily  . atorvastatin  80 mg Oral QHS  . bisacodyl  10 mg Oral Daily   Or  . bisacodyl  10 mg Rectal Daily  . Chlorhexidine Gluconate Cloth  6 each Topical Daily  . docusate sodium  200 mg Oral Daily  . enoxaparin (LOVENOX) injection  40 mg Subcutaneous QHS  . insulin aspart  0-24 Units Subcutaneous Q4H  . insulin detemir  20 Units Subcutaneous Daily  . mouth rinse  15 mL Mouth Rinse BID  . pantoprazole  40 mg Oral Daily  . sodium chloride flush  10-40 mL Intracatheter Q12H  . sodium chloride flush  3 mL Intravenous Q12H    Infusions: . sodium chloride 20 mL/hr at 04/09/17 1900  . sodium chloride    . sodium chloride Stopped (04/09/17 2100)  . amiodarone 30 mg/hr (04/10/17 0100)  . EPINEPHrine 4 mg in dextrose 5% 250 mL infusion (16 mcg/mL) 1.013 mcg/min (04/09/17 1900)  . lactated ringers    . lactated ringers 20 mL/hr at 04/09/17 1900  . milrinone    . nitroGLYCERIN Stopped (04/08/17 1333)  . norepinephrine (LEVOPHED) Adult infusion    . piperacillin-tazobactam (ZOSYN)  IV Stopped (04/10/17 0451)  . vancomycin Stopped (04/10/17 0057)    PRN  Medications: sodium chloride, morphine injection, ondansetron (ZOFRAN) IV, oxyCODONE, sodium chloride flush, traMADol   Patient Profile   71 y/o with anterior STEMI who developed cardiogenic shock. 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 Impella placed for stabilization of shock.   S/p CABG with LIMA  to LAD and SVG to OM on 04/08/17  Assessment/Plan   1. Cardiogenic shock - Impella removed 12/7. Swan out 12/8  - s/p CABG 12/10 - POD#2. Tenuous but stable. Remains on triple pressors. Wean epi as tolerated.   2. Severe CAD - s/p CABG 12/10. Stable post-op. No ischemic symptoms  - Continue ASA and statin. Add b-blocker when off drips   3. Acute systolic CHF in setting of STEMI: Echo with EF 15-20%.  - Ischemic CMP.  - s/p CABG - Wean pressors as tolerated. - CXR looks wet. Will add lasix 80 IV bid  4. ID -> Sepsis unclear source.  - UCx with pseudomonas.  - BCx NGTD - Sputum culture negative - CCM treating for AECOPD - WBC remains very high but no fevers. I have d/w Dr. Beryle Beams who will look at slide - Continue [email or web address] an expand as needed  5. Acute hypoxic respiratory failure - CXR with diffuse interstitial changes +/- edema (Personally reviewed) - Will diurese.  - Continue abx - ? Component of SIRS - Review smear with Dr. Darnell Level.  - Consider chest CT  6. Hyponatremia - Free H2O restrict. Can consider dose of tolvaptan if worsening.   7. Tobacco abuse - Smoked 1 ppd PTA for > 50 years.  - Understands need for smoking cessation   8. Hx of bladder CA - Pt states he had multiple rounds of Bacillus Calmette-Guerin (BCG) therapy. No change.   9. Scrotal swelling - [place] 04/04/17 with R hydrocele and ? Epididymitis. - Continue to follow fluid status.  On broad spectrum ABX already.     CRITICAL CARE Performed by: Glori Bickers  Total critical care time: 35 minutes  Critical care time was exclusive of  separately billable procedures and treating other patients.  Critical care was necessary to treat or prevent imminent or life-threatening deterioration.  Critical care was time spent personally by me (independent of midlevel providers or residents) on the following activities: development of treatment plan with patient and/or surrogate as well as nursing, discussions with consultants, evaluation of patient's response to treatment, examination of patient, obtaining history from patient or surrogate, ordering and performing treatments and interventions, ordering and review of laboratory studies, ordering and review of radiographic studies, pulse oximetry and re-evaluation of patient's condition.     Length of Stay: [place], MD  04/10/2017, 9:06 AM  Advanced Heart Failure Team Pager [phone] (M-F; [place])  Please contact [facility] for night-coverage after hours (4p -7a ) and weekends on [email or web address]

## 2017-04-10 NOTE — Progress Notes (Signed)
2 Days Post-Op Procedure(s) (LRB): CORONARY ARTERY BYPASS GRAFTING (CABG) TIMES TWO USING LEFT INTERNAL MAMMARY ARTERY AND LEFT SAPHENOUS LEG VEIN HARVESTED ENDOSCOPICALLY.  LEG VEIN ALSO HARVESTED FROM THE RIGHT LEG (N/A) TRANSESOPHAGEAL ECHOCARDIOGRAM (TEE) (N/A) Subjective: No specific complaints. Pain well-controlled. Still has cough.  Oxygen turned up overnight to 10 L HFNC for decreased sats. 94% this am.  Had recurrent atrial fib with RVR overnight and given a bolus of amio with return to sinus.   Objective: Vital signs in last 24 hours: Temp:  [97 F (36.1 C)-98.2 F (36.8 C)] 97 F (36.1 C) (12/12 0600) Pulse Rate:  [59-155] 97 (12/12 0700) Cardiac Rhythm: Normal sinus rhythm (12/12 0400) Resp:  [8-25] 9 (12/12 0700) BP: (75-133)/(40-84) 112/55 (12/12 0700) SpO2:  [79 %-95 %] 95 % (12/12 0700) Arterial Line BP: (76-148)/(33-57) 128/41 (12/12 0700) Weight:  [105.6 kg (232 lb 12.9 oz)] 105.6 kg (232 lb 12.9 oz) (12/12 0500)  Hemodynamic parameters for last 24 hours: PAP: (17-54)/(7-21) 23/9 CO:  [6.3 L/min-7.5 L/min] 6.3 L/min CI:  [2.8 L/min/m2-3.4 L/min/m2] 2.8 L/min/m2  Intake/Output from previous day: 12/11 0701 - 12/12 0700 In: 5003.5 [P.O.:600; I.V.:3381.5; Blood:322; IV [id number]:678] Out: 9381 [Urine:725; Chest Tube:380] Intake/Output this shift: No intake/output data recorded.  General appearance: alert and cooperative Neurologic: intact Heart: regular rate and rhythm, S1, S2 normal, no murmur, click, rub or gallop Lungs: clear to auscultation bilaterally Abdomen: soft, non-tender; bowel sounds normal; no masses,  no organomegaly Extremities: edema moderate Wound: incisions ok  Lab Results: Recent Labs    04/09/17 1723 04/09/17 1730 04/10/17 0353  WBC 44.1*  --  51.1*  HGB 8.6* 8.5* 8.2*  HCT 24.9* 25.0* 23.6*  PLT 323  --  347   BMET:  Recent Labs    04/09/17 0230  04/09/17 1730 04/10/17 0353  NA 131*  --  129* 126*  K 4.3  --  4.8 4.2   CL 101  --  95* 96*  CO2 22  --   --  21*  GLUCOSE 114*  --  269* 148*  BUN 34*  --  38* 43*  CREATININE 1.34*   < > 1.60* 1.73*  CALCIUM 7.0*  --   --  6.9*   < > = values in this interval not displayed.    PT/INR:  Recent Labs    04/08/17 1242  LABPROT 16.5*  INR 1.35   ABG    Component Value Date/Time   PHART 7.398 04/08/2017 1843   HCO3 23.2 04/08/2017 1843   TCO2 22 04/09/2017 1730   ACIDBASEDEF 2.0 04/08/2017 1843   O2SAT 96.0 04/08/2017 1843   CBG (last 3)  Recent Labs    04/09/17 1657 04/09/17 1959 04/10/17 0408  GLUCAP 258* 235* 146*   CXR: stable interstitial edema  Assessment/Plan: S/P Procedure(s) (LRB): CORONARY ARTERY BYPASS GRAFTING (CABG) TIMES TWO USING LEFT INTERNAL MAMMARY ARTERY AND LEFT SAPHENOUS LEG VEIN HARVESTED ENDOSCOPICALLY.  LEG VEIN ALSO HARVESTED FROM THE RIGHT LEG (N/A) TRANSESOPHAGEAL ECHOCARDIOGRAM (TEE) (N/A)  POD 2  He is hemodynamically stable but still requiring levophed to maintain BP. CI is 2.8 on Milrinone 0.175 and epi 1 mcg. Will continue low dose milrinone and epi today to maximize CO with AKI postop. Wean levophed as tolerated.  DC swan so he can get OOB to chair.  AKI: creat was normal on admission and preop but has gone up to 1.73. UO acceptable. His PA pressures are normal and since still requiring levophed I have avoided diuresis  so far. Expect creat to start coming back down in the next day or so.   Leukocytosis: still rising. Unclear if this is sepsis, preop steroids, inflammatory. He is not having any fever. Only positive culture was urine. Will DC foley. Continue vanc and Zosyn for now although concerned about effect of vanc on kidneys.  Chest tube output decreasing but will leave in for another day. He had over 1L of effusion on each side at the time of surgery.   DM: glucose under adequate control.   LOS: 10 days    [name] 04/10/2017

## 2017-04-10 NOTE — Progress Notes (Signed)
Patient ID: [name] [name], male   DOB: [birth year], 71 y.o.   MRN: [id number] [facility] Evening Rounds  Remains hemodynamically vasodilated requiring levophed 6 mcg. Epi is off. Milrinone at 0.125  Received lasix 80 mg twice today. Diuresed some. Repeat labs in am.  sats 95%

## 2017-04-10 NOTE — Progress Notes (Signed)
MD on call notified about A. Line not reading accurately. RN and RT tried repositioning, and redressing line. Pressure bag also changed. A. Line still positional, also hard to pull back blood.   No orders from MD to remove a. Line at this time while patient is still on levo.   RN will continue to monitor.  Dominiqua Cooner E Reola Mosher, [place]

## 2017-04-11 ENCOUNTER — Inpatient Hospital Stay (HOSPITAL_COMMUNITY): Payer: Medicare Other

## 2017-04-11 LAB — TYPE AND SCREEN
ABO/RH(D): A POS
Antibody Screen: NEGATIVE
UNIT DIVISION: 0
UNIT DIVISION: 0
Unit division: 0
Unit division: 0
Unit division: 0
Unit division: 0

## 2017-04-11 LAB — BPAM RBC
BLOOD PRODUCT EXPIRATION DATE: 201812272359
BLOOD PRODUCT EXPIRATION DATE: 201812272359
BLOOD PRODUCT EXPIRATION DATE: 201812272359
Blood Product Expiration Date: 201812262359
Blood Product Expiration Date: 201812262359
Blood Product Expiration Date: 201812272359
ISSUE DATE / TIME: 201812062205
ISSUE DATE / TIME: 201812101702
ISSUE DATE / TIME: 201812101702
ISSUE DATE / TIME: 201812111041
UNIT TYPE AND RH: 6200
UNIT TYPE AND RH: 6200
UNIT TYPE AND RH: 6200
UNIT TYPE AND RH: 6200
Unit Type and Rh: 6200
Unit Type and Rh: 6200

## 2017-04-11 LAB — COOXEMETRY PANEL
CARBOXYHEMOGLOBIN: 1.3 % (ref 0.5–1.5)
METHEMOGLOBIN: 1.3 % (ref 0.0–1.5)
O2 SAT: 55.1 %
TOTAL HEMOGLOBIN: 7.7 g/dL — AB (ref 12.0–16.0)

## 2017-04-11 LAB — BASIC METABOLIC PANEL
ANION GAP: 10 (ref 5–15)
BUN: 42 mg/dL — AB (ref 6–20)
CO2: 23 mmol/L (ref 22–32)
Calcium: 7.1 mg/dL — ABNORMAL LOW (ref 8.9–10.3)
Chloride: 94 mmol/L — ABNORMAL LOW (ref 101–111)
Creatinine, Ser: 1.68 mg/dL — ABNORMAL HIGH (ref 0.61–1.24)
GFR, EST AFRICAN AMERICAN: 46 mL/min — AB (ref >60)
GFR, EST NON AFRICAN AMERICAN: 39 mL/min — AB (ref >60)
Glucose, Bld: 155 mg/dL — ABNORMAL HIGH (ref 65–99)
POTASSIUM: 3.8 mmol/L (ref 3.5–5.1)
SODIUM: 127 mmol/L — AB (ref 135–145)

## 2017-04-11 LAB — GLUCOSE, CAPILLARY
GLUCOSE-CAPILLARY: 114 mg/dL — AB (ref 65–99)
GLUCOSE-CAPILLARY: 118 mg/dL — AB (ref 65–99)
Glucose-Capillary: 93 mg/dL (ref 65–99)
Glucose-Capillary: 135 mg/dL — ABNORMAL HIGH (ref 65–99)
Glucose-Capillary: 110 mg/dL — ABNORMAL HIGH (ref 65–99)

## 2017-04-11 LAB — CBC
HCT: 22.8 % — ABNORMAL LOW (ref 39.0–52.0)
Hemoglobin: 8 g/dL — ABNORMAL LOW (ref 13.0–17.0)
MCH: 30 pg (ref 26.0–34.0)
MCHC: 35.1 g/dL (ref 30.0–36.0)
MCV: 85.4 fL (ref 78.0–100.0)
PLATELETS: 317 10*3/uL (ref 150–400)
RBC: 2.67 MIL/uL — AB (ref 4.22–5.81)
RDW: 14.4 % (ref 11.5–15.5)
WBC: 34.5 10*3/uL — AB (ref 4.0–10.5)

## 2017-04-11 LAB — VANCOMYCIN, TROUGH: VANCOMYCIN TR: 40 ug/mL — AB (ref 15–20)

## 2017-04-11 LAB — PREPARE RBC (CROSSMATCH)

## 2017-04-11 MED ORDER — AMIODARONE IV BOLUS ONLY 150 MG/100ML
150.0000 mg | Freq: Once | INTRAVENOUS | Status: AC
  Administered 2017-04-11: 150 mg via INTRAVENOUS

## 2017-04-11 MED ORDER — AMIODARONE IV BOLUS ONLY 150 MG/100ML: 150.0000 mg | Freq: Once | INTRAVENOUS | Status: DC

## 2017-04-11 MED ORDER — SODIUM CHLORIDE 0.9 % IV SOLN
Freq: Once | INTRAVENOUS | Status: AC
  Administered 2017-04-11: 10 mL/h via INTRAVENOUS

## 2017-04-11 MED ORDER — AMIODARONE LOAD VIA INFUSION
150.0000 mg | Freq: Once | INTRAVENOUS | Status: AC
  Administered 2017-04-11: 150 mg via INTRAVENOUS
  Filled 2017-04-11: qty 83.34

## 2017-04-11 MED ORDER — FUROSEMIDE 10 MG/ML IJ SOLN
10.0000 mg/h | INTRAVENOUS | Status: DC
  Administered 2017-04-11 – 2017-04-14 (×4): 10 mg/h via INTRAVENOUS
  Filled 2017-04-11 (×3): qty 25
  Filled 2017-04-11: qty 21
  Filled 2017-04-11 (×2): qty 25

## 2017-04-11 MED ORDER — POTASSIUM CHLORIDE CRYS ER 20 MEQ PO TBCR
20.0000 meq | EXTENDED_RELEASE_TABLET | Freq: Three times a day (TID) | ORAL | Status: DC
  Administered 2017-04-11 (×3): 20 meq via ORAL
  Filled 2017-04-11 (×3): qty 1

## 2017-04-11 MED ORDER — METOLAZONE 2.5 MG PO TABS: 2.5000 mg | ORAL_TABLET | Freq: Once | ORAL | Status: AC

## 2017-04-11 MED ORDER — METOLAZONE 5 MG PO TABS
5.0000 mg | ORAL_TABLET | Freq: Once | ORAL | Status: AC
  Administered 2017-04-11: 5 mg via ORAL
  Filled 2017-04-11: qty 1

## 2017-04-11 NOTE — Progress Notes (Signed)
Pharmacy Antibiotic Note  [name] [name] is a 71 y.o. male admitted on 03/31/2017 with fever, flu-like symptoms and he was started on vancomycin and zosyn.  Respiratory virus panel negative.  Likely RUL PNA. S/P CABG 12/10.  Afebrile post op.  WBC trended down pre op then elevated post op so antibiotics continued.  20K/ml pseudomonas in urine.  Other cultures remain negative.    Vancomycin trough drawn today is elevated at 40 (goal 15-20). SCr had been trending up 1.34>1.49>1.73 but is actually improving today.   Plan: 1) Hold vancomycin for now, recheck level tomorrow morning 2) Continue zosyn 3.375g IV q8 (4 hour infusion) 3) Follow up LOT - per TCTS to continue for now  Height: 6\' 2"  (188 cm) Weight: 232 lb 5.8 oz (105.4 kg) IBW/kg (Calculated) : 82.2  Temp (24hrs), Avg:98 F (36.7 C), Min:97.5 F (36.4 C), Max:98.3 F (36.8 C)  Recent Labs  Lab 04/09/17 0230 04/09/17 1020 04/09/17 1723 04/09/17 1730 04/10/17 0353 04/11/17 0415 04/11/17 1230  WBC 41.0* 44.8* 44.1*  --  51.1* 34.5*  --   CREATININE 1.34* 1.49* 1.73* 1.60* 1.73* 1.68*  --   VANCOTROUGH  --   --   --   --   --   --  40*    Estimated Creatinine Clearance: 52.2 mL/min (A) (by C-G formula based on SCr of 1.68 mg/dL (H)).    No Known Allergies  Antimicrobials this admission:  12/3 Vancomycin > 12/3 Zosyn >   Dose adjustments this admission:  12/5 VT = 16 on 1250 q 12 hrs - con't same 12/13 VT = 40 on 1250 q 12 hrs - hold  Microbiology results:  12/5 Resp Cx: normal flora 12/4 Resp Cx: normal flora 12/3 BCx x 2: neg 12/3 UCx: 20,000/ml pseudomonas - pan-S 12/2 MRSA PCR: negative 12/2 Resp virus panel - negative   Thank you for allowing pharmacy to be a part of this patient's care.  Nena Jordan Pharm.D, BCPS 04/11/2017 2:32 PM

## 2017-04-11 NOTE — Progress Notes (Signed)
3 Days Post-Op Procedure(s) (LRB): CORONARY ARTERY BYPASS GRAFTING (CABG) TIMES TWO USING LEFT INTERNAL MAMMARY ARTERY AND LEFT SAPHENOUS LEG VEIN HARVESTED ENDOSCOPICALLY.  LEG VEIN ALSO HARVESTED FROM THE RIGHT LEG (N/A) TRANSESOPHAGEAL ECHOCARDIOGRAM (TEE) (N/A) Subjective: No complaints  Some recurrent atrial fib with RVR overnight and got another bolus of amio.  Co-ox 55.1 this am on milrinone 0.125 and levophed 11mcg.  Objective:  Vital signs in last 24 hours: Temp:  [97.5 F (36.4 C)-98.3 F (36.8 C)] 98.3 F (36.8 C) (12/13 0300) Pulse Rate:  [96-132] 131 (12/13 0500) Cardiac Rhythm: Normal sinus rhythm (12/13 0400) Resp:  [8-22] 15 (12/13 0615) BP: (88-121)/(51-71) 92/55 (12/13 0615) SpO2:  [85 %-98 %] 94 % (12/13 0500) Arterial Line BP: (61-125)/(44-114) 64/56 (12/13 0500) Weight:  [105.4 kg (232 lb 5.8 oz)] 105.4 kg (232 lb 5.8 oz) (12/13 0500)  Hemodynamic parameters for last 24 hours: PAP: (26)/(12) 26/12 CVP:  [13 mmHg] 13 mmHg  Intake/Output from previous day: 12/12 0701 - 12/13 0700 In: 1442.7 [P.O.:170; I.V.:872.7; IV Piggyback:400] Out: 1980 [Urine:1850; Chest Tube:130] Intake/Output this shift: No intake/output data recorded.  General appearance: alert and cooperative Neurologic: intact Heart: regular rate and rhythm, S1, S2 normal, no murmur, click, rub or gallop Lungs: clear to auscultation bilaterally Extremities: edema moderate Wound: incisions ok  Lab Results: Recent Labs    04/10/17 0353 04/11/17 0415  WBC 51.1* 34.5*  HGB 8.2* 8.0*  HCT 23.6* 22.8*  PLT 347 317   BMET:  Recent Labs    04/10/17 0353 04/11/17 0415  NA 126* 127*  K 4.2 3.8  CL 96* 94*  CO2 21* 23  GLUCOSE 148* 155*  BUN 43* 42*  CREATININE 1.73* 1.68*  CALCIUM 6.9* 7.1*    PT/INR:  Recent Labs    04/08/17 1242  LABPROT 16.5*  INR 1.35   ABG    Component Value Date/Time   PHART 7.398 04/08/2017 1843   HCO3 23.2 04/08/2017 1843   TCO2 22 04/09/2017  1730   ACIDBASEDEF 2.0 04/08/2017 1843   O2SAT 55.1 04/11/2017 0423   CBG (last 3)  Recent Labs    04/10/17 2336 04/11/17 0357 04/11/17 0735  GLUCAP 129* 93 114*   CXR: stable. Lungs fairly clear  Assessment/Plan: S/P Procedure(s) (LRB): CORONARY ARTERY BYPASS GRAFTING (CABG) TIMES TWO USING LEFT INTERNAL MAMMARY ARTERY AND LEFT SAPHENOUS LEG VEIN HARVESTED ENDOSCOPICALLY.  LEG VEIN ALSO HARVESTED FROM THE RIGHT LEG (N/A) TRANSESOPHAGEAL ECHOCARDIOGRAM (TEE) (N/A)  POD 3  He is hemodynamically stable but still requiring low dose levophed. Co-ox 55.1 and Hgb 8.0 so will transfuse a unit of cells. Continue Milrinone at current level. Wean levophed as tolerated.  AKI: creat slightly improved. Continue diuresis and replace K+.  Leukocytosis: improving. Continue antibiotics for now.  DC chest tubes  Mobilize. Will consult PT.   LOS: 11 days    [name] 04/11/2017

## 2017-04-11 NOTE — Progress Notes (Signed)
Advanced Heart Failure Rounding Note  Subjective:    POD #3 CABG with LIMA to LAD and SVG to OM (12/10)  Off epi. On milrinone 0.125 mcg + 2 mcg norepi. Yesterday diuresed with IV lasix. Had some AF overnight and treated with IV amio. Now back in NSR.   Co-ox 55% Appears more SOB   WBC coming down 51>34  Complaining of fatigue.    Objective:   Weight Range: 232 lb 5.8 oz (105.4 kg) Body mass index is 29.83 kg/m.   Vital Signs:   Temp:  [97.9 F (36.6 C)-98.3 F (36.8 C)] 97.9 F (36.6 C) (12/13 0724) Pulse Rate:  [95-132] 103 (12/13 0845) Resp:  [8-22] 18 (12/13 0845) BP: (88-121)/(47-93) 117/93 (12/13 0845) SpO2:  [90 %-100 %] 98 % (12/13 0845) Arterial Line BP: (61-117)/(54-114) 64/56 (12/13 0500) Weight:  [232 lb 5.8 oz (105.4 kg)] 232 lb 5.8 oz (105.4 kg) (12/13 0500) Last BM Date: 04/06/17 Weight change: Filed Weights   04/09/17 0500 04/10/17 0500 04/11/17 0500  Weight: 212 lb 8.4 oz (96.4 kg) 232 lb 12.9 oz (105.6 kg) 232 lb 5.8 oz (105.4 kg)   Intake/Output:   Intake/Output Summary (Last 24 hours) at 04/11/2017 0921 Last data filed at 04/11/2017 0800 Gross per 24 hour  Intake 1273.12 ml  Output 1755 ml  Net -481.88 ml    Physical Exam    General:  Pale Mildly dyspneic HEENT: normal Neck: supple. JVP to jaw.  . Carotids 2+ bilat; no bruits. No lymphadenopathy or thryomegaly appreciated. Cor: PMI nondisplaced. Sternal dressing ok. +CTs Regular rate & rhythm. No rubs, gallops or murmurs. Lungs: coarse on 4 liters.  Abdomen: soft, nontender, nondistended. No hepatosplenomegaly. No bruits or masses. Good bowel sounds. Extremities: no cyanosis, clubbing, rash, R and LLE 2-3+ edema. RUE PICC  Neuro: alert & orientedx3, cranial nerves grossly intact. moves all 4 extremities w/o difficulty. Affect pleasant   Telemetry   NSR 90-100. Personally reviewed  Labs    CBC Recent Labs    04/10/17 0353 04/11/17 0415  WBC 51.1* 34.5*  HGB 8.2* 8.0*    HCT 23.6* 22.8*  MCV 85.2 85.4  PLT 347 622   Basic Metabolic Panel Recent Labs    04/09/17 0230  04/09/17 1723  04/10/17 0353 04/11/17 0415  NA 131*  --   --    < > 126* 127*  K 4.3  --   --    < > 4.2 3.8  CL 101  --   --    < > 96* 94*  CO2 22  --   --   --  21* 23  GLUCOSE 114*  --   --    < > 148* 155*  BUN 34*  --   --    < > 43* 42*  CREATININE 1.34*   < > 1.73*   < > 1.73* 1.68*  CALCIUM 7.0*  --   --   --  6.9* 7.1*  MG 3.1*  --  2.9*  --   --   --    < > = values in this interval not displayed.   Liver Function Tests No results for input(s): AST, ALT, ALKPHOS, BILITOT, PROT, ALBUMIN in the last 72 hours. No results for input(s): LIPASE, AMYLASE in the last 72 hours. Cardiac Enzymes No results for input(s): CKTOTAL, CKMB, CKMBINDEX, TROPONINI in the last 72 hours.  BNP: BNP (last 3 results) Recent Labs    03/31/17 2341  BNP 786.9*  ProBNP (last 3 results) No results for input(s): PROBNP in the last 8760 hours.   D-Dimer No results for input(s): DDIMER in the last 72 hours. Hemoglobin A1C No results for input(s): HGBA1C in the last 72 hours. Fasting Lipid Panel No results for input(s): CHOL, HDL, LDLCALC, TRIG, CHOLHDL, LDLDIRECT in the last 72 hours. Thyroid Function Tests No results for input(s): TSH, T4TOTAL, T3FREE, THYROIDAB in the last 72 hours.  Invalid input(s): FREET3  Other results:   Imaging    Dg Chest Port 1 View  Result Date: 04/11/2017 CLINICAL DATA:  Shortness of breath.  Left chest tube.  Prior CABG. EXAM: PORTABLE CHEST 1 VIEW COMPARISON:  04/10/2017 . FINDINGS: Interim removal Swan-Ganz catheter. Right PICC line and bilateral chest tubes in stable position. A miniscule right apical pneumothorax cannot be excluded on today's exam. Prior CABG. Stable cardiomegaly. No pulmonary venous congestion. Low lung volumes with basilar atelectasis. Interstitial prominence unchanged. No focal alveolar infiltrate. Tiny bilateral pleural  effusions. IMPRESSION: 1. Interim removal Swan-Ganz catheter. Right PICC line and bilateral chest tubes in stable position. Miniscule right apical pneumothorax cannot be excluded. 2.  Prior CABG.  Stable cardiomegaly. 3. Mild interstitial prominence noted bilaterally is stable possibly chronic . No pulmonary alveolar infiltrate. Tiny bilateral pleural effusions. Critical Value/emergent results were called by telephone at the time of interpretation on 04/11/2017 at 8:02 am to nurse Audelia Acton , who verbally acknowledged these results. Electronically Signed   By: Marcello Moores  Register   On: 04/11/2017 08:04     Medications:     Scheduled Medications: . acetaminophen  1,000 mg Oral Q6H   Or  . acetaminophen (TYLENOL) oral liquid 160 mg/5 mL  1,000 mg Per Tube Q6H  . amiodarone  150 mg Intravenous Once  . aspirin EC  325 mg Oral Daily   Or  . aspirin  324 mg Per Tube Daily  . atorvastatin  80 mg Oral QHS  . bisacodyl  10 mg Oral Daily   Or  . bisacodyl  10 mg Rectal Daily  . Chlorhexidine Gluconate Cloth  6 each Topical Daily  . docusate sodium  200 mg Oral Daily  . enoxaparin (LOVENOX) injection  40 mg Subcutaneous QHS  . furosemide  80 mg Intravenous BID  . insulin aspart  0-24 Units Subcutaneous Q4H  . insulin detemir  20 Units Subcutaneous Daily  . mouth rinse  15 mL Mouth Rinse BID  . pantoprazole  40 mg Oral Daily  . potassium chloride  20 mEq Oral TID  . sodium chloride flush  10-40 mL Intracatheter Q12H  . sodium chloride flush  3 mL Intravenous Q12H    Infusions: . sodium chloride Stopped (04/10/17 0800)  . sodium chloride    . sodium chloride Stopped (04/09/17 2100)  . sodium chloride    . amiodarone 30 mg/hr (04/11/17 0100)  . lactated ringers    . lactated ringers 10 mL/hr at 04/11/17 0600  . milrinone 0.125 mcg/kg/min (04/11/17 0600)  . norepinephrine (LEVOPHED) Adult infusion 2 mcg/min (04/11/17 0830)  . piperacillin-tazobactam (ZOSYN)  IV Stopped (04/11/17 0604)  .  vancomycin Stopped (04/11/17 0132)    PRN Medications: sodium chloride, morphine injection, ondansetron (ZOFRAN) IV, oxyCODONE, sodium chloride flush, traMADol   Patient Profile   71 y/o with anterior STEMI who developed cardiogenic shock. 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 Impella placed for stabilization of shock.   S/p CABG with LIMA to LAD and  SVG to OM on 04/08/17  Assessment/Plan   1. Cardiogenic shock - Impella removed 12/7. Swan out 12/8  - s/p CABG 12/10 - POD#3 Remain on milrinone 0.125 mcg + norepi at 2 mcg. Todays Co-ox is 55%. Wean norepi as tolerated.   2. Severe CAD - s/p CABG 12/10. Stable post-op. No ischemic symptoms  - Continue ASA and statin. Add b-blocker when off drips   3. Acute systolic CHF in setting of STEMI: Echo with EF 15-20%.  - Ischemic CMP.  - s/p CABG - Wean pressors as tolerated. - Continue lasix 80 IV bid and 5 mg metolazone x1 . Weight up >15 pounds.  - BMET in am.   4. ID -> Sepsis unclear source.  - UCx with pseudomonas.  - BCx NGTD  - Sputum culture negative - CCM treating for AECOPD - WBC trending down.  - Continue vanc and zosyn.  5. Acute hypoxic respiratory failure - CXR with diffuse interstitial changes +/- edema (Personally reviewed) - Will diurese.  - Continue abx - ? Component of SIRS - Review smear with Dr. Darnell Level.  - Consider chest CT  6. Hyponatremia - Free H2O restrict. Can consider dose of tolvaptan if worsening.  -Sodium 127  7. Tobacco abuse - Smoked 1 ppd PTA for > 50 years.  - Understands need for smoking cessation   8. Hx of bladder CA - Pt states he had multiple rounds of Bacillus Calmette-Guerin (BCG) therapy. No change.   9. Scrotal swelling - [place] 04/04/17 with R hydrocele and ? Epididymitis. - On broad spectrum ABX already.    Length of Stay: [place], NP  04/11/2017, 9:21 AM  Advanced Heart Failure Team Pager [phone] (M-F;  7a - 4p)  Please contact [facility] for night-coverage after hours (4p -7a ) and weekends on [email or web address]  Patient seen and examined with Darrick Grinder, NP. We discussed all aspects of the encounter. I agree with the assessment and plan as stated above.   Remains tenuous. Co-ox marginal on low-dose NE and milrinone. Will continue. Respiratory status seems a bit worse today in setting of volume overload. Will start lasix gtt. Had recurrent PAF last night now back in NSR with IV amio. Will continue for now. WBC improving now. Continue to mobilize. Will need SNF or CIR. Would consult CIR.   Glori Bickers, MD  2:46 PM

## 2017-04-11 NOTE — Evaluation (Signed)
Physical Therapy Evaluation Patient Details Name: [name] [name] MRN: [id number] DOB: [birth year] Today's Date: 04/11/2017   History of Present Illness  pt is a 71 y/o male with pmh significant for HTN, DM, Bladder CA, but no cardiac hx, presents to the ED with acute complaints of dyspnea, thought to be due to flu symptoms, but found to have STEMI by ECG, ARF with hypoxia on 6L and acute systolic HF.   S/P Cath.  S/P CABGx2 on 12/11.  Clinical Impression  Pt admitted with/for the above complications.  Pt s/p CABG.  At this time he needs at least min assist form basic mobility to move following sternal precautions.  Pt currently limited functionally due to the problems listed below.  (see problems list.)  Pt will benefit from PT to maximize function and safety to be able to get home safely with available assist.     Follow Up Recommendations Home health PT;Supervision/Assistance - 24 hour    Equipment Recommendations  3in1 (PT);Rolling walker with 5" wheels(tbd)    Recommendations for Other Services       Precautions / Restrictions Precautions Precautions: Fall Precaution Comments: Watch HR--Labile on eval, sternal precautions Restrictions Weight Bearing Restrictions: Yes      Mobility  Bed Mobility Overal bed mobility: Needs Assistance Bed Mobility: Supine to Sit;Sit to Supine     Supine to sit: Min assist Sit to supine: Min assist   General bed mobility comments: cued for and assisted to build momentum so as not to use UE's.  needed minimal truncal assist to come up to sit and LE assist to get legs into bed.  Transfers Overall transfer level: Needs assistance   Transfers: Sit to/from Stand Sit to Stand: Min assist         General transfer comment: cues for sternal precaution  Ambulation/Gait Ambulation/Gait assistance: Min assist Ambulation Distance (Feet): 15 Feet(to from the bathroom with EVA walker) Assistive device: (EVA walker) Gait Pattern/deviations:  Step-through pattern   Gait velocity interpretation: Below normal speed for age/gender General Gait Details: mildly staggered gait with wide BOS.  pt fatigue in a short distance  Financial trader Rankin (Stroke Patients Only)       Balance Overall balance assessment: Needs assistance Sitting-balance support: No upper extremity supported Sitting balance-Leahy Scale: Good     Standing balance support: Bilateral upper extremity supported Standing balance-Leahy Scale: Poor Standing balance comment: reliant on external support                             Pertinent Vitals/Pain Pain Assessment: Faces Faces Pain Scale: Hurts even more Pain Location: sternal Pain Descriptors / Indicators: Aching Pain Intervention(s): Monitored during session    Home Living Family/patient expects to be discharged to:: Private residence Living Arrangements: Spouse/significant other Available Help at Discharge: Family;Available 24 hours/day Type of Home: House Home Access: Stairs to enter   [employer] of Steps: 1 Home Layout: Two level;Bed/bath upstairs Home Equipment: None      Prior Function Level of Independence: Independent               Hand Dominance        Extremity/Trunk Assessment   Upper Extremity Assessment Upper Extremity Assessment: Overall WFL for tasks assessed    Lower Extremity Assessment Lower Extremity Assessment: Overall WFL for tasks assessed;Generalized weakness  Communication   Communication: No difficulties  Cognition Arousal/Alertness: Awake/alert Behavior During Therapy: WFL for tasks assessed/performed Overall Cognitive Status: Within Functional Limits for tasks assessed                                        General Comments General comments (skin integrity, edema, etc.): HR mildly labile throughout between low 100's and 130.  Pt mildly dyspneic     Exercises     Assessment/Plan    PT Assessment Patient needs continued PT services  PT Problem List Decreased strength;Decreased activity tolerance;Decreased balance;Decreased mobility;Cardiopulmonary status limiting activity;Decreased knowledge of use of DME;Pain       PT Treatment Interventions DME instruction;Gait training;Stair training;Functional mobility training;Therapeutic activities;Patient/family education    PT Goals (Current goals can be found in the Care Plan section)  Acute Rehab PT Goals Patient Stated Goal: independent PT Goal Formulation: With patient Time For Goal Achievement: 04/25/17 Potential to Achieve Goals: Good    Frequency Min 3X/week   Barriers to discharge        Co-evaluation               AM-PAC PT "6 Clicks" Daily Activity  Outcome Measure Difficulty turning over in bed (including adjusting bedclothes, sheets and blankets)?: Unable Difficulty moving from lying on back to sitting on the side of the bed? : Unable Difficulty sitting down on and standing up from a chair with arms (e.g., wheelchair, bedside commode, etc,.)?: Unable Help needed moving to and from a bed to chair (including a wheelchair)?: A Little Help needed walking in hospital room?: A Little Help needed climbing 3-5 steps with a railing? : A Lot 6 Click Score: 11    End of Session Equipment Utilized During Treatment: Oxygen Activity Tolerance: Patient tolerated treatment well Patient left: in bed;with call bell/phone within reach;with bed alarm set Nurse Communication: Mobility status PT Visit Diagnosis: Unsteadiness on feet (R26.81);Difficulty in walking, not elsewhere classified (R26.2);Pain Pain - Right/Left: (midline) Pain - part of body: (sternL)    Time: 3546-5681 PT Time Calculation (min) (ACUTE ONLY): 38 min   Charges:   PT Evaluation $PT Eval Moderate Complexity: 1 Mod PT Treatments $Gait Training: 8-22 mins $Therapeutic Activity: 8-22 mins   PT G  Codes:        April 28, 2017  Donnella Sham, PT [phone]  (pager)  [name] 2017/04/28, 3:35 PM

## 2017-04-11 NOTE — Progress Notes (Signed)
TCTS BRIEF SICU PROGRESS NOTE  3 Days Post-Op  S/P Procedure(s) (LRB): CORONARY ARTERY BYPASS GRAFTING (CABG) TIMES TWO USING LEFT INTERNAL MAMMARY ARTERY AND LEFT SAPHENOUS LEG VEIN HARVESTED ENDOSCOPICALLY.  LEG VEIN ALSO HARVESTED FROM THE RIGHT LEG (N/A) TRANSESOPHAGEAL ECHOCARDIOGRAM (TEE) (N/A)   Afib w/ variable but stable HR, BP stable Breathing comfortably Diuresing well  Plan: Continue current plan  Rexene Alberts, MD 04/11/2017 7:58 PM

## 2017-04-11 NOTE — Progress Notes (Signed)
Patient went into a.fib rvr HR in the 120's-150's sustained. Dr. Cyndia Bent made aware. Verbal order for amio bolus received.

## 2017-04-12 ENCOUNTER — Inpatient Hospital Stay (HOSPITAL_COMMUNITY): Payer: Medicare Other

## 2017-04-12 LAB — TYPE AND SCREEN
ABO/RH(D): A POS
ANTIBODY SCREEN: NEGATIVE
Unit division: 0

## 2017-04-12 LAB — BASIC METABOLIC PANEL
Anion gap: 11 (ref 5–15)
BUN: 43 mg/dL — AB (ref 6–20)
CO2: 23 mmol/L (ref 22–32)
CREATININE: 1.84 mg/dL — AB (ref 0.61–1.24)
Calcium: 7.1 mg/dL — ABNORMAL LOW (ref 8.9–10.3)
Chloride: 93 mmol/L — ABNORMAL LOW (ref 101–111)
GFR calc Af Amer: 41 mL/min — ABNORMAL LOW (ref >60)
GFR, EST NON AFRICAN AMERICAN: 35 mL/min — AB (ref >60)
GLUCOSE: 140 mg/dL — AB (ref 65–99)
Potassium: 3.4 mmol/L — ABNORMAL LOW (ref 3.5–5.1)
SODIUM: 127 mmol/L — AB (ref 135–145)

## 2017-04-12 LAB — GLUCOSE, CAPILLARY
GLUCOSE-CAPILLARY: 81 mg/dL (ref 65–99)
GLUCOSE-CAPILLARY: 160 mg/dL — AB (ref 65–99)
GLUCOSE-CAPILLARY: 137 mg/dL — AB (ref 65–99)
Glucose-Capillary: 133 mg/dL — ABNORMAL HIGH (ref 65–99)
Glucose-Capillary: 84 mg/dL (ref 65–99)
Glucose-Capillary: 88 mg/dL (ref 65–99)

## 2017-04-12 LAB — CBC
HCT: 25.7 % — ABNORMAL LOW (ref 39.0–52.0)
Hemoglobin: 8.8 g/dL — ABNORMAL LOW (ref 13.0–17.0)
MCH: 28.9 pg (ref 26.0–34.0)
MCHC: 34.2 g/dL (ref 30.0–36.0)
MCV: 84.3 fL (ref 78.0–100.0)
PLATELETS: 338 10*3/uL (ref 150–400)
RBC: 3.05 MIL/uL — AB (ref 4.22–5.81)
RDW: 14.8 % (ref 11.5–15.5)
WBC: 23.8 10*3/uL — ABNORMAL HIGH (ref 4.0–10.5)

## 2017-04-12 LAB — COOXEMETRY PANEL
CARBOXYHEMOGLOBIN: 1.6 % — AB (ref 0.5–1.5)
Methemoglobin: 1.4 % (ref 0.0–1.5)
O2 SAT: 70.2 %
Total hemoglobin: 8.2 g/dL — ABNORMAL LOW (ref 12.0–16.0)

## 2017-04-12 LAB — BPAM RBC
BLOOD PRODUCT EXPIRATION DATE: 201812182359
ISSUE DATE / TIME: 201812131030
UNIT TYPE AND RH: 5100

## 2017-04-12 LAB — POCT ACTIVATED CLOTTING TIME: Activated Clotting Time: 158 seconds

## 2017-04-12 LAB — VANCOMYCIN, RANDOM: VANCOMYCIN RM: 31

## 2017-04-12 MED ORDER — POTASSIUM CHLORIDE CRYS ER 20 MEQ PO TBCR: 20.0000 meq | EXTENDED_RELEASE_TABLET | ORAL | Status: DC | PRN

## 2017-04-12 MED ORDER — INSULIN ASPART 100 UNIT/ML ~~LOC~~ SOLN
0.0000 [IU] | Freq: Three times a day (TID) | SUBCUTANEOUS | Status: DC
  Administered 2017-04-12 – 2017-04-14 (×8): 2 [IU] via SUBCUTANEOUS
  Administered 2017-04-15: 4 [IU] via SUBCUTANEOUS
  Administered 2017-04-16 (×3): 2 [IU] via SUBCUTANEOUS

## 2017-04-12 MED ORDER — SODIUM CHLORIDE 0.9% FLUSH: 10.0000 mL | INTRAVENOUS | Status: DC | PRN

## 2017-04-12 MED ORDER — SPIRONOLACTONE 12.5 MG HALF TABLET
12.5000 mg | ORAL_TABLET | Freq: Every day | ORAL | Status: DC
  Administered 2017-04-12 – 2017-04-14 (×3): 12.5 mg via ORAL
  Filled 2017-04-12 (×3): qty 1

## 2017-04-12 MED ORDER — POTASSIUM CHLORIDE CRYS ER 20 MEQ PO TBCR
30.0000 meq | EXTENDED_RELEASE_TABLET | Freq: Three times a day (TID) | ORAL | Status: DC
  Administered 2017-04-12 – 2017-04-13 (×4): 30 meq via ORAL
  Filled 2017-04-12 (×4): qty 1

## 2017-04-12 MED ORDER — AMIODARONE IV BOLUS ONLY 150 MG/100ML: 150.0000 mg | Freq: Once | INTRAVENOUS | Status: DC

## 2017-04-12 MED ORDER — AMIODARONE LOAD VIA INFUSION
150.0000 mg | Freq: Once | INTRAVENOUS | Status: AC
  Administered 2017-04-12: 150 mg via INTRAVENOUS
  Filled 2017-04-12: qty 83.34

## 2017-04-12 MED FILL — Potassium Chloride Inj 2 mEq/ML: INTRAVENOUS | Qty: 40 | Status: AC

## 2017-04-12 MED FILL — Heparin Sodium (Porcine) Inj 1000 Unit/ML: INTRAMUSCULAR | Qty: 30 | Status: AC

## 2017-04-12 MED FILL — Magnesium Sulfate Inj 50%: INTRAMUSCULAR | Qty: 10 | Status: AC

## 2017-04-12 NOTE — Progress Notes (Signed)
Pharmacy Antibiotic Note  [name] [name] is a 71 y.o. male admitted on 03/31/2017 with fever, flu-like symptoms and he was started on vancomycin and zosyn.    Respiratory virus panel negative. Likely RUL PNA. S/P CABG 12/10.  Afebrile post op.  WBC trended down pre op then elevated post op so antibiotics continued. 20K/ml pseudomonas in urine.  Other cultures remain negative.    Vancomycin trough drawn this morning is trended down slightly but is still elevated at 31 (goal 15-20). SCr continues to trend up to 1.84 today. Good uop overnight.  Plan: 1) Continue to hold vancomycin for now, recheck random level tomorrow morning to follow trend and redose when down within goal. 2) Continue zosyn 3.375g IV q8 (4 hour infusion) 3) Follow up LOT - per TCTS to continue for now  Height: 6\' 2"  (188 cm) Weight: 212 lb 15.4 oz (96.6 kg) IBW/kg (Calculated) : 82.2  Temp (24hrs), Avg:97.9 F (36.6 C), Min:97.5 F (36.4 C), Max:98.7 F (37.1 C)  Recent Labs  Lab 04/09/17 1020 04/09/17 1723 04/09/17 1730 04/10/17 0353 04/11/17 0415 04/11/17 1230 04/12/17 0402  WBC 44.8* 44.1*  --  51.1* 34.5*  --  23.8*  CREATININE 1.49* 1.73* 1.60* 1.73* 1.68*  --  1.84*  VANCOTROUGH  --   --   --   --   --  40*  --   VANCORANDOM  --   --   --   --   --   --  31    Estimated Creatinine Clearance: 42.8 mL/min (A) (by C-G formula based on SCr of 1.84 mg/dL (H)).    No Known Allergies  Antimicrobials this admission:  12/3 Vancomycin > 12/3 Zosyn >   Dose adjustments this admission:  12/5 VT = 16 on 1250 q 12 hrs - con't same 12/13 VT = 40 on 1250 q 12 hrs - hold 12/14 Random vanc = 31 - hold and recheck in am  Microbiology results:  12/5 Resp Cx: normal flora 12/4 Resp Cx: normal flora 12/3 BCx x 2: neg 12/3 UCx: 20,000/ml pseudomonas - pan-S 12/2 MRSA PCR: negative 12/2 Resp virus panel - negative   Thank you for allowing pharmacy to be a part of this patient's care.  Erin Hearing PharmD.,  BCPS Clinical Pharmacist Pager [phone] 04/12/2017 8:50 AM

## 2017-04-12 NOTE — Progress Notes (Addendum)
4 Days Post-Op Procedure(s) (LRB): CORONARY ARTERY BYPASS GRAFTING (CABG) TIMES TWO USING LEFT INTERNAL MAMMARY ARTERY AND LEFT SAPHENOUS LEG VEIN HARVESTED ENDOSCOPICALLY.  LEG VEIN ALSO HARVESTED FROM THE RIGHT LEG (N/A) TRANSESOPHAGEAL ECHOCARDIOGRAM (TEE) (N/A) Subjective: No specific complaints  Back in atrial fib this am on IV amio.   Objective: Vital signs in last 24 hours: Temp:  [97.5 F (36.4 C)-98.7 F (37.1 C)] 97.9 F (36.6 C) (12/14 0725) Pulse Rate:  [45-154] 115 (12/14 0915) Cardiac Rhythm: Atrial fibrillation (12/14 0000) Resp:  [8-27] 17 (12/14 0900) BP: (76-124)/(44-82) 100/82 (12/14 0730) SpO2:  [74 %-100 %] 94 % (12/14 0915) Weight:  [96.6 kg (212 lb 15.4 oz)] 96.6 kg (212 lb 15.4 oz) (12/14 0500)  Hemodynamic parameters for last 24 hours:    Intake/Output from previous day: 12/13 0701 - 12/14 0700 In: 888 [I.V.:518; Blood:270; IV Piggyback:100] Out: 2610 [Urine:2610] Intake/Output this shift: No intake/output data recorded.  General appearance: alert and cooperative Neurologic: intact Heart: irregular rate and rhythm, S1, S2 normal, no murmur, click, rub or gallop Lungs: clear to auscultation bilaterally Extremities:  moderate edema in lower extremies to thigh. Wound: chest incision healing well. leg incisions are weeping serous fluid.  Lab Results: Recent Labs    04/11/17 0415 04/12/17 0402  WBC 34.5* 23.8*  HGB 8.0* 8.8*  HCT 22.8* 25.7*  PLT 317 338   BMET:  Recent Labs    04/11/17 0415 04/12/17 0402  NA 127* 127*  K 3.8 3.4*  CL 94* 93*  CO2 23 23  GLUCOSE 155* 140*  BUN 42* 43*  CREATININE 1.68* 1.84*  CALCIUM 7.1* 7.1*    PT/INR: No results for input(s): LABPROT, INR in the last 72 hours. ABG    Component Value Date/Time   PHART 7.398 04/08/2017 1843   HCO3 23.2 04/08/2017 1843   TCO2 22 04/09/2017 1730   ACIDBASEDEF 2.0 04/08/2017 1843   O2SAT 70.2 04/12/2017 0425   CBG (last 3)  Recent Labs    04/12/17 0030  04/12/17 0403 04/12/17 0730  GLUCAP 84 81 88    Assessment/Plan: S/P Procedure(s) (LRB): CORONARY ARTERY BYPASS GRAFTING (CABG) TIMES TWO USING LEFT INTERNAL MAMMARY ARTERY AND LEFT SAPHENOUS LEG VEIN HARVESTED ENDOSCOPICALLY.  LEG VEIN ALSO HARVESTED FROM THE RIGHT LEG (N/A) TRANSESOPHAGEAL ECHOCARDIOGRAM (TEE) (N/A)  POD 4  He is hemodynamically stable on milrinone 0.125. Co-ox 70.2. Will DC milrinone.  AKI: creat slightly higher with diuresis and vanc trough was 40 yesterday.  He was started on lasix drip yesterday by Dr. Haroldine Laws.  Continue diuresis and replace K+. Stop vancomycin. Follow up on renal function tomorrow.   Leukocytosis: improving. Continue Zosyn and stop vanc. Vanc will be around for a few more days anyway.   Postop atrial fibrillation: Recurrent on IV amio. Will give a bolus this am. He will likely require anticoagulation at some point but would like to get pacing wires out first unless Coumadin is used.  Mobilize. Will consult PT.  DM: glucose under good control  LOS: 12 days    [name] 04/12/2017

## 2017-04-12 NOTE — Progress Notes (Addendum)
Advanced Heart Failure Rounding Note  Subjective:    POD #4 s/p CABG with LIMA to LAD and SVG to OM on 12/10  Off norepi. sBP 90s-110s. Remains on milrinone 0.125 mcg.   Lasix gtt overnight. Cr 1.8, uptrending.   Co-ox 70% today.    WBC 34--> 23.8. Remains on vanc + zosyn   Some AF overnight. On amio.   No complaints this AM. Able to ambulate with PT yesterday.    Objective:   Weight Range: 212 lb 15.4 oz (96.6 kg) Body mass index is 27.34 kg/m.   Vital Signs:   Temp:  [97.5 F (36.4 C)-98.7 F (37.1 C)] 97.9 F (36.6 C) (12/14 0725) Pulse Rate:  [45-154] 115 (12/14 0915) Resp:  [8-27] 17 (12/14 0900) BP: (76-124)/(44-82) 100/82 (12/14 0730) SpO2:  [74 %-100 %] 94 % (12/14 0915) Weight:  [212 lb 15.4 oz (96.6 kg)] 212 lb 15.4 oz (96.6 kg) (12/14 0500) Last BM Date: 04/11/17 Weight change: Filed Weights   04/10/17 0500 04/11/17 0500 04/12/17 0500  Weight: 232 lb 12.9 oz (105.6 kg) 232 lb 5.8 oz (105.4 kg) 212 lb 15.4 oz (96.6 kg)   Intake/Output:   Intake/Output Summary (Last 24 hours) at 04/12/2017 0930 Last data filed at 04/12/2017 0600 Gross per 24 hour  Intake 887.97 ml  Output 2610 ml  Net -1722.03 ml    Physical Exam  CVP 5-6  General: appears ill but much improved than yesterday. Sitting up in chair.  HEENT: normal  Neck: supple, no JVD. Carotids 2+ bilat; no bruits. No LAD or thyromegaly noted.  Cor: PMI nondisplaced. Irregular rate. No rubs, gallops or murmur. Sternal wound healing well.  Lungs: coarse on 2L  Abdomen: soft, NTND. No HSM. No bruits or masses. Good bowel sounds.  Extremities: 1+ pitting edema bilaterally. No cyanosis, clubbing or rash  Neuro: alert & orientedx3, cranial nerves grossly intact. moves all 4 extremities w/o difficulty. Affect pleasant   Telemetry   Episodes of Afib overnight. Personally reviewed.   Labs    CBC Recent Labs    04/11/17 0415 04/12/17 0402  WBC 34.5* 23.8*  HGB 8.0* 8.8*  HCT 22.8* 25.7*   MCV 85.4 84.3  PLT 317 350   Basic Metabolic Panel Recent Labs    04/09/17 1723  04/11/17 0415 04/12/17 0402  NA  --    < > 127* 127*  K  --    < > 3.8 3.4*  CL  --    < > 94* 93*  CO2  --    < > 23 23  GLUCOSE  --    < > 155* 140*  BUN  --    < > 42* 43*  CREATININE 1.73*   < > 1.68* 1.84*  CALCIUM  --    < > 7.1* 7.1*  MG 2.9*  --   --   --    < > = values in this interval not displayed.   Liver Function Tests No results for input(s): AST, ALT, ALKPHOS, BILITOT, PROT, ALBUMIN in the last 72 hours. No results for input(s): LIPASE, AMYLASE in the last 72 hours. Cardiac Enzymes No results for input(s): CKTOTAL, CKMB, CKMBINDEX, TROPONINI in the last 72 hours.  BNP: BNP (last 3 results) Recent Labs    03/31/17 2341  BNP 786.9*    ProBNP (last 3 results) No results for input(s): PROBNP in the last 8760 hours.   D-Dimer No results for input(s): DDIMER in the last 72 hours.  Hemoglobin A1C No results for input(s): HGBA1C in the last 72 hours. Fasting Lipid Panel No results for input(s): CHOL, HDL, LDLCALC, TRIG, CHOLHDL, LDLDIRECT in the last 72 hours. Thyroid Function Tests No results for input(s): TSH, T4TOTAL, T3FREE, THYROIDAB in the last 72 hours.  Invalid input(s): FREET3  Other results:   Imaging    Dg Chest Port 1 View  Result Date: 04/12/2017 CLINICAL DATA:  CABG. EXAM: PORTABLE CHEST 1 VIEW COMPARISON:  04/11/2017.  03/31/2017. FINDINGS: Interim removal of bilateral chest tubes. Right PICC line stable position. No pneumothorax. Prior CABG. Stable cardiomegaly. Slight increase interstitial prominence noted in the upper lobes. This could be secondary to mild interstitial edema and/or pneumonitis. Improved aeration lung bases with mild residual left base atelectasis. Density in the right upper lobe at site of prior chest tube consist with mild atelectasis . Small bilateral pleural effusions. IMPRESSION: 1. Interim removal of bilateral chest tubes. No  pneumothorax . right PICC line stable position. 2. Prior CABG.  Stable cardiomegaly. 3. Slight increase interstitial prominence in the upper lobes, possibly related to interstitial edema and/or pneumonitis. Improved aeration in the lung bases with mild residual left base atelectasis. Electronically Signed   By: Marcello Moores  Register   On: 04/12/2017 07:22     Medications:     Scheduled Medications: . acetaminophen  1,000 mg Oral Q6H   Or  . acetaminophen (TYLENOL) oral liquid 160 mg/5 mL  1,000 mg Per Tube Q6H  . amiodarone  150 mg Intravenous Once  . aspirin EC  325 mg Oral Daily   Or  . aspirin  324 mg Per Tube Daily  . atorvastatin  80 mg Oral QHS  . bisacodyl  10 mg Oral Daily   Or  . bisacodyl  10 mg Rectal Daily  . Chlorhexidine Gluconate Cloth  6 each Topical Daily  . docusate sodium  200 mg Oral Daily  . enoxaparin (LOVENOX) injection  40 mg Subcutaneous QHS  . insulin aspart  0-24 Units Subcutaneous Q4H  . insulin detemir  20 Units Subcutaneous Daily  . mouth rinse  15 mL Mouth Rinse BID  . pantoprazole  40 mg Oral Daily  . potassium chloride  20 mEq Oral TID  . sodium chloride flush  10-40 mL Intracatheter Q12H  . sodium chloride flush  3 mL Intravenous Q12H    Infusions: . sodium chloride Stopped (04/10/17 0800)  . sodium chloride    . sodium chloride Stopped (04/09/17 2100)  . amiodarone 30 mg/hr (04/12/17 0009)  . furosemide (LASIX) infusion 10 mg/hr (04/11/17 1522)  . lactated ringers    . lactated ringers 10 mL/hr at 04/11/17 0600  . milrinone 0.125 mcg/kg/min (04/12/17 0549)  . norepinephrine (LEVOPHED) Adult infusion Stopped (04/11/17 1100)  . piperacillin-tazobactam (ZOSYN)  IV 3.375 g (04/12/17 0232)  . vancomycin Stopped (04/11/17 0132)    PRN Medications: sodium chloride, morphine injection, ondansetron (ZOFRAN) IV, oxyCODONE, sodium chloride flush, traMADol   Patient Profile   71 y/o with anterior STEMI who developed cardiogenic shock. Cath with  severe 3v CAD with long high-grade lesion in LAD (unfavorable for PCI) and CTO of RCA. Impella placed for stabilization of shock.   S/p CABG with LIMA to LAD and SVG to OM on 04/08/17  Assessment/Plan   1. Cardiogenic shock - Impella removed 12/7. Swan out 12/8  - POD #4 s/p CABG 12/10 - Off norepi. Remains on milrinone 0.125 mcg.  Co-ox is 70%.   2. Severe CAD - s/p CABG 12/10. Stable  post-op. No ischemic symptoms  - Continue ASA and statin. Add b-blocker when off drips   3. Acute systolic CHF in setting of STEMI: Echo with EF 15-20%.  - Ischemic CMP.  - s/p CABG - Wean pressors as tolerated. - On Lasix gtt   4. ID -> Sepsis unclear source.  - UCx with pseudomonas.  - BCx NGTD  - Sputum culture negative - CCM treating for AECOPD - WBC trending down.  - Continue vanc and zosyn   5. Acute hypoxic respiratory failure - CXR with diffuse interstitial changes +/- edema, unchanged from yesterday (personally reviewed) - Diuresis as above  - Continue abx - ? Component of SIRS - Consider chest CT  6. Hyponatremia: NA 127 today  - Free H2O restrict. Can consider dose of tolvaptan if worsening.   7. Tobacco abuse - Smoked 1 ppd PTA for > 50 years.  - Understands need for smoking cessation   8. Hx of bladder CA - Pt states he had multiple rounds of Bacillus Calmette-Guerin (BCG) therapy. No change.   9. Scrotal swelling - [place] 04/04/17 with R hydrocele and ? Epididymitis. - On broad spectrum ABX already.    Length of Stay: 12  Welford Roche, MD  04/12/2017, 9:30 AM  Advanced Heart Failure Team Pager [phone] (M-F; [place])  Please contact [facility] for night-coverage after hours (4p -7a ) and weekends on [email or web address]  Welford Roche, MD  9:30 AM  Patient seen and examined with the above-signed Advanced Practice Provider and/or Housestaff. I personally reviewed laboratory data, imaging studies and relevant notes. I independently examined the patient  and formulated the important aspects of the plan. I have edited the note to reflect any of my changes or salient points. I have personally discussed the plan with the patient and/or family.  Was back in AF last night. Now back in NSR on amio. Continues to diurese well. Weight nearing baseline. Creatinine up slightly. Will continue lasix gtt one more day. Co-ox 70% on milrinone 0.125.   Hopefull can stop milrinone and lasix tomorrow. Ad low dose spiro. BP too soft for other agents at this time.   WBC falling. Vanc stopped. Remains on zosyn. CXR still with prominent interstitial infiltrates (Personally reviewed). Would like to stop amio as soon as we can. Can start Eliquis once pacing wires out.   Continue to mobilize. Overall improving steadily. Will consult CIR to get their input on his disposition.   Glori Bickers, MD  4:58 PM

## 2017-04-13 LAB — BASIC METABOLIC PANEL
Anion gap: 14 (ref 5–15)
BUN: 40 mg/dL — ABNORMAL HIGH (ref 6–20)
CO2: 24 mmol/L (ref 22–32)
Calcium: 7.1 mg/dL — ABNORMAL LOW (ref 8.9–10.3)
Chloride: 89 mmol/L — ABNORMAL LOW (ref 101–111)
Creatinine, Ser: 1.95 mg/dL — ABNORMAL HIGH (ref 0.61–1.24)
GFR calc Af Amer: 38 mL/min — ABNORMAL LOW (ref >60)
GFR calc non Af Amer: 33 mL/min — ABNORMAL LOW (ref >60)
Glucose, Bld: 168 mg/dL — ABNORMAL HIGH (ref 65–99)
Potassium: 3.3 mmol/L — ABNORMAL LOW (ref 3.5–5.1)
Sodium: 127 mmol/L — ABNORMAL LOW (ref 135–145)

## 2017-04-13 LAB — GLUCOSE, CAPILLARY
GLUCOSE-CAPILLARY: 157 mg/dL — AB (ref 65–99)
Glucose-Capillary: 123 mg/dL — ABNORMAL HIGH (ref 65–99)
Glucose-Capillary: 137 mg/dL — ABNORMAL HIGH (ref 65–99)
Glucose-Capillary: 119 mg/dL — ABNORMAL HIGH (ref 65–99)

## 2017-04-13 LAB — COOXEMETRY PANEL
Carboxyhemoglobin: 1.2 % (ref 0.5–1.5)
METHEMOGLOBIN: 1.3 % (ref 0.0–1.5)
O2 Saturation: 53.5 %
TOTAL HEMOGLOBIN: 9.9 g/dL — AB (ref 12.0–16.0)

## 2017-04-13 LAB — CBC
HCT: 26.8 % — ABNORMAL LOW (ref 39.0–52.0)
Hemoglobin: 9.2 g/dL — ABNORMAL LOW (ref 13.0–17.0)
MCH: 29 pg (ref 26.0–34.0)
MCHC: 34.3 g/dL (ref 30.0–36.0)
MCV: 84.5 fL (ref 78.0–100.0)
Platelets: 355 10*3/uL (ref 150–400)
RBC: 3.17 MIL/uL — ABNORMAL LOW (ref 4.22–5.81)
RDW: 14.7 % (ref 11.5–15.5)
WBC: 21.3 10*3/uL — ABNORMAL HIGH (ref 4.0–10.5)

## 2017-04-13 LAB — MAGNESIUM: Magnesium: 2 mg/dL (ref 1.7–2.4)

## 2017-04-13 MED ORDER — DIGOXIN 125 MCG PO TABS
0.1250 mg | ORAL_TABLET | Freq: Every day | ORAL | Status: DC
  Administered 2017-04-13 – 2017-04-18 (×6): 0.125 mg via ORAL
  Filled 2017-04-13 (×6): qty 1

## 2017-04-13 MED ORDER — POTASSIUM CHLORIDE CRYS ER 20 MEQ PO TBCR
40.0000 meq | EXTENDED_RELEASE_TABLET | Freq: Three times a day (TID) | ORAL | Status: DC
  Administered 2017-04-13 – 2017-04-14 (×5): 40 meq via ORAL
  Filled 2017-04-13 (×5): qty 2

## 2017-04-13 MED ORDER — AMIODARONE HCL 200 MG PO TABS
400.0000 mg | ORAL_TABLET | Freq: Two times a day (BID) | ORAL | Status: DC
  Administered 2017-04-13 – 2017-04-18 (×11): 400 mg via ORAL
  Filled 2017-04-13 (×11): qty 2

## 2017-04-13 NOTE — Progress Notes (Signed)
[facility]  CONSULT SERVICE NOTE   Chart reviewed. Pt is at min assist level already with PT. By the time he's medically ready for inpatient rehab, he will likely be too high level from a functional standpoint. At this time I recommend [facility] PT when medically appropriate to for discharge home. We will follow at a distance in case things should change.   Thanks  Meredith Staggers, MD, [facility] 04/13/2017

## 2017-04-13 NOTE — Plan of Care (Signed)
Pt progressively increasing and tolerating activity. Able ambulate 370 feet without difficulty.  Pt hemodynamics stable on Amiodarone drip, converted to PO. Pt diuresing well on ordered Aldactone.  Pt with improved mood and outlook, less withdrawn and interactive.

## 2017-04-13 NOTE — Progress Notes (Addendum)
[facility] DAILY ICU PROGRESS NOTE                   [address]            [place], [address]          [phone]   5 Days Post-Op Procedure(s) (LRB): CORONARY ARTERY BYPASS GRAFTING (CABG) TIMES TWO USING LEFT INTERNAL MAMMARY ARTERY AND LEFT SAPHENOUS LEG VEIN HARVESTED ENDOSCOPICALLY.  LEG VEIN ALSO HARVESTED FROM THE RIGHT LEG (N/A) TRANSESOPHAGEAL ECHOCARDIOGRAM (TEE) (N/A)  Total Length of Stay:  LOS: 13 days   Subjective: Patient with loose stools  Objective: Vital signs in last 24 hours: Temp:  [97.8 F (36.6 C)-99.1 F (37.3 C)] 98.3 F (36.8 C) (12/15 1602) Pulse Rate:  [79-90] 79 (12/15 1700) Cardiac Rhythm: Normal sinus rhythm;Heart block (12/15 1600) Resp:  [10-20] 18 (12/15 1800) BP: (104-125)/(51-79) 114/61 (12/15 1700) SpO2:  [91 %-100 %] 97 % (12/15 1700) Weight:  [218 lb 4.1 oz (99 kg)] 218 lb 4.1 oz (99 kg) (12/15 0700)  Filed Weights   04/11/17 0500 04/12/17 0500 04/13/17 0700  Weight: 232 lb 5.8 oz (105.4 kg) 212 lb 15.4 oz (96.6 kg) 218 lb 4.1 oz (99 kg)    Weight change: 5 lb 4.7 oz (2.4 kg)   Hemodynamic parameters for last 24 hours: CVP:  [12 mmHg-16 mmHg] 12 mmHg  Intake/Output from previous day: 12/14 0701 - 12/15 0700 In: 2145.1 [P.O.:1200; I.V.:845.1; IV Piggyback:100] Out: 3570 [Urine:3125; Stool:1]  Intake/Output this shift: No intake/output data recorded.  Current Meds: Scheduled Meds: . amiodarone  400 mg Oral BID  . aspirin EC  325 mg Oral Daily   Or  . aspirin  324 mg Per Tube Daily  . atorvastatin  80 mg Oral QHS  . bisacodyl  10 mg Oral Daily   Or  . bisacodyl  10 mg Rectal Daily  . Chlorhexidine Gluconate Cloth  6 each Topical Daily  . digoxin  0.125 mg Oral Daily  . docusate sodium  200 mg Oral Daily  . enoxaparin (LOVENOX) injection  40 mg Subcutaneous QHS  . insulin aspart  0-24 Units Subcutaneous TID WC  . insulin detemir  20 Units Subcutaneous Daily  . mouth rinse  15 mL Mouth Rinse BID  .  pantoprazole  40 mg Oral Daily  . potassium chloride  40 mEq Oral TID  . spironolactone  12.5 mg Oral Daily   Continuous Infusions: . furosemide (LASIX) infusion 10 mg/hr (04/13/17 1645)  . piperacillin-tazobactam (ZOSYN)  IV 3.375 g (04/13/17 1835)   PRN Meds:.morphine injection, ondansetron (ZOFRAN) IV, oxyCODONE, sodium chloride flush, traMADol  General appearance: alert, cooperative and no distress Neurologic: intact Heart: RRR Lungs: Slightly diminished at bases Abdomen: Soft, non tender, bowel sounds present Extremities: Mild LE edema Wound: Sternal wound is clean and dry;bilater LE dressings clean and dry  Lab Results: CBC: Recent Labs    04/12/17 0402 04/13/17 0328  WBC 23.8* 21.3*  HGB 8.8* 9.2*  HCT 25.7* 26.8*  PLT 338 355   BMET:  Recent Labs    04/12/17 0402 04/13/17 0328  NA 127* 127*  K 3.4* 3.3*  CL 93* 89*  CO2 23 24  GLUCOSE 140* 168*  BUN 43* 40*  CREATININE 1.84* 1.95*  CALCIUM 7.1* 7.1*    CMET: Lab Results  Component Value Date   WBC 21.3 (H) 04/13/2017   HGB 9.2 (L) 04/13/2017   HCT 26.8 (L) 04/13/2017   PLT 355 04/13/2017  GLUCOSE 168 (H) 04/13/2017   CHOL 129 04/01/2017   TRIG 93 04/01/2017   HDL 28 (L) 04/01/2017   LDLCALC 82 04/01/2017   ALT 27 03/31/2017   AST 58 (H) 03/31/2017   NA 127 (L) 04/13/2017   K 3.3 (L) 04/13/2017   CL 89 (L) 04/13/2017   CREATININE 1.95 (H) 04/13/2017   BUN 40 (H) 04/13/2017   CO2 24 04/13/2017   INR 1.35 04/08/2017   HGBA1C 6.3 (H) 03/31/2017    PT/INR: No results for input(s): LABPROT, INR in the last 72 hours. Radiology: No results found.   Assessment/Plan: S/P Procedure(s) (LRB): CORONARY ARTERY BYPASS GRAFTING (CABG) TIMES TWO USING LEFT INTERNAL MAMMARY ARTERY AND LEFT SAPHENOUS LEG VEIN HARVESTED ENDOSCOPICALLY.  LEG VEIN ALSO HARVESTED FROM THE RIGHT LEG (N/A) TRANSESOPHAGEAL ECHOCARDIOGRAM (TEE) (N/A)  1. CV-s/p cardiogenic shock, ischemic cardiomyopathy. Previous PAF.  Maintaining SR, first degree heart block.  Milrinone stopped 04/12/2017. Co ox decreased to 53.5 earlier today. On Amiodarone 400 mg bid, Spironolactone 12.5 mg daily, Digoxin 0.125 mg daily. 2. Pulmonary-On 2 liters of oxygen via [place]. Encourage incentive spirometer and flutter valve. 3. ABL anemia-H and H increased to 9.2 and 26.8 4. Acute systolic heart failure-on Lasix drip. Heart failure following 5. ID-on Zosyn for possible PNA. Remains afebrile and WBC slightly decreased to 21,300. 6. Mild thrombocytopenia-platelets 127,000 7. AKI-creatinine slightly increased to 1.95. 8. Potassium supplemented 9. CBGs 123/157/137. Hope to restart Actoplus Met if creatinine normalizes. Continue Insulin for now. Pre op HGA1C 6.3. 10. Stool softeners have been held but will officially stop.  Donielle Liston Alba PA-C 04/13/2017 7:47 PM   patient examined and medical record reviewed,agree with above note. Tharon Aquas Trigt III 04/14/2017

## 2017-04-13 NOTE — Progress Notes (Addendum)
Patient ID: [name] [name], male   DOB: 01-06-1946, 71 y.o.   MRN: [id number]     Advanced Heart Failure Rounding Note  Subjective:    POD #4 s/p CABG with LIMA to LAD and SVG to OM on 12/10  Milrinone off yesterday, no co-ox today.  CVP not set up.   He continues on Lasix gtt with good diuresis though creatinine up to 1.97.  Weights to not appear accurate.    WBC 34 -> 23.8 -> 21. Remains on vanco/Zosyn (not getting vanco today with high trough).    He is in NSR on amiodarone gtt.    Walked in hall today.     Objective:   Weight Range: 218 lb 4.1 oz (99 kg) Body mass index is 28.02 kg/m.   Vital Signs:   Temp:  [97.8 F (36.6 C)-99.1 F (37.3 C)] 98.3 F (36.8 C) (12/15 0749) Pulse Rate:  [80-108] 82 (12/15 0800) Resp:  [10-20] 13 (12/15 0800) BP: (104-142)/(52-110) 119/56 (12/15 0800) SpO2:  [91 %-100 %] 93 % (12/15 0800) Weight:  [218 lb 4.1 oz (99 kg)] 218 lb 4.1 oz (99 kg) (12/15 0700) Last BM Date: 04/12/17 Weight change: Filed Weights   04/11/17 0500 04/12/17 0500 04/13/17 0700  Weight: 232 lb 5.8 oz (105.4 kg) 212 lb 15.4 oz (96.6 kg) 218 lb 4.1 oz (99 kg)   Intake/Output:   Intake/Output Summary (Last 24 hours) at 04/13/2017 1102 Last data filed at 04/13/2017 1026 Gross per 24 hour  Intake 1914 ml  Output 3426 ml  Net -1512 ml    Physical Exam   General: NAD Neck: JVP 14+ cm, no thyromegaly or thyroid nodule.  Lungs: Mild crackles at bases bilaterally. CV: Nondisplaced PMI.  Heart regular S1/S2, no S3/S4, no murmur.  2+ edema to knees bilaterally.   Abdomen: Soft, nontender, no hepatosplenomegaly, no distention.  Skin: Intact without lesions or rashes.  Neurologic: Alert and oriented x 3.  Psych: Normal affect. Extremities: No clubbing or cyanosis.  HEENT: Normal.    Telemetry   NSR. Personally reviewed.   Labs    CBC Recent Labs    04/12/17 0402 04/13/17 0328  WBC 23.8* 21.3*  HGB 8.8* 9.2*  HCT 25.7* 26.8*  MCV 84.3 84.5  PLT  338 102   Basic Metabolic Panel Recent Labs    04/12/17 0402 04/13/17 0328  NA 127* 127*  K 3.4* 3.3*  CL 93* 89*  CO2 23 24  GLUCOSE 140* 168*  BUN 43* 40*  CREATININE 1.84* 1.95*  CALCIUM 7.1* 7.1*  MG  --  2.0   Liver Function Tests No results for input(s): AST, ALT, ALKPHOS, BILITOT, PROT, ALBUMIN in the last 72 hours. No results for input(s): LIPASE, AMYLASE in the last 72 hours. Cardiac Enzymes No results for input(s): CKTOTAL, CKMB, CKMBINDEX, TROPONINI in the last 72 hours.  BNP: BNP (last 3 results) Recent Labs    03/31/17 2341  BNP 786.9*    ProBNP (last 3 results) No results for input(s): PROBNP in the last 8760 hours.   D-Dimer No results for input(s): DDIMER in the last 72 hours. Hemoglobin A1C No results for input(s): HGBA1C in the last 72 hours. Fasting Lipid Panel No results for input(s): CHOL, HDL, LDLCALC, TRIG, CHOLHDL, LDLDIRECT in the last 72 hours. Thyroid Function Tests No results for input(s): TSH, T4TOTAL, T3FREE, THYROIDAB in the last 72 hours.  Invalid input(s): FREET3  Other results:   Imaging    No results found.  Medications:     Scheduled Medications: . acetaminophen  1,000 mg Oral Q6H   Or  . acetaminophen (TYLENOL) oral liquid 160 mg/5 mL  1,000 mg Per Tube Q6H  . aspirin EC  325 mg Oral Daily   Or  . aspirin  324 mg Per Tube Daily  . atorvastatin  80 mg Oral QHS  . bisacodyl  10 mg Oral Daily   Or  . bisacodyl  10 mg Rectal Daily  . Chlorhexidine Gluconate Cloth  6 each Topical Daily  . digoxin  0.125 mg Oral Daily  . docusate sodium  200 mg Oral Daily  . enoxaparin (LOVENOX) injection  40 mg Subcutaneous QHS  . insulin aspart  0-24 Units Subcutaneous TID WC  . insulin detemir  20 Units Subcutaneous Daily  . mouth rinse  15 mL Mouth Rinse BID  . pantoprazole  40 mg Oral Daily  . potassium chloride  30 mEq Oral TID  . spironolactone  12.5 mg Oral Daily    Infusions: . amiodarone 30 mg/hr (04/13/17  0952)  . furosemide (LASIX) infusion 10 mg/hr (04/13/17 0700)  . piperacillin-tazobactam (ZOSYN)  IV 3.375 g (04/13/17 0956)    PRN Medications: morphine injection, ondansetron (ZOFRAN) IV, oxyCODONE, sodium chloride flush, traMADol   Patient Profile   71 y/o with anterior STEMI who developed cardiogenic shock. 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 Impella placed for stabilization of shock.   S/p CABG with LIMA to LAD and SVG to OM on 04/08/17  Assessment/Plan   1. Cardiogenic shock/acute systolic CHF: Ischemic cardiomyopathy, EF with EF 15-20%.  He is now off milrinone.  No co-ox or CVP this morning but he appears volume overloaded still on exam with JVD and peripheral edema.  Creatinine up mildly and BUN down mildly.  - Set up CVP to follow.  - Will send co-ox, stay off milrinone for now.  - Continue Lasix gtt 10 mg/hr for today with volume overload, good UOP yesterday.  - Add digoxin 0.125.  - Continue spironolactone 12.5 daily.  2. CAD: s/p CABG 12/10. Stable post-op. No ischemic symptoms  - Continue ASA and statin.  3.  ID -> Sepsis unclear source. UCx with pseudomonas. BCx NGTD. Sputum culture negative. CCM treating for AECOPD. WBC trending down.  - Continue vanc and zosyn  4. Hyponatremia: NA 127 today, stable.  - Free H2O restrict. Can consider dose of tolvaptan if worsening.  5. Tobacco abuse: Smoked 1 ppd PTA for > 50 years.  - Understands need for smoking cessation  6. Hx of bladder CA: Pt states he had multiple rounds of Bacillus Calmette-Guerin (BCG) therapy. No change.  7. Scrotal swelling: [place] 04/04/17 with R hydrocele and ? Epididymitis. - On broad spectrum ABX already.  8. AKI: Creatinine fairly stable today.  Watch closely with diuresis.  9. Atrial fibrillation: Paroxysmal, currently NSR on amiodarone.   - Will transition to po amiodarone now that off milrinone.  Would probably continue x 1 month post-op.    - When pacing wires are out, plan for Eliquis.   Length of Stay: 39  Loralie Champagne, MD  04/13/2017, 11:02 AM  Advanced Heart Failure Team Pager [phone] (M-F; 7a - 4p)  Please contact [facility] for night-coverage after hours (4p -7a ) and weekends on [email or web address]  Loralie Champagne, MD  11:02 AM 04/13/2017

## 2017-04-14 ENCOUNTER — Inpatient Hospital Stay (HOSPITAL_COMMUNITY): Payer: Medicare Other

## 2017-04-14 LAB — GLUCOSE, CAPILLARY
GLUCOSE-CAPILLARY: 141 mg/dL — AB (ref 65–99)
Glucose-Capillary: 141 mg/dL — ABNORMAL HIGH (ref 65–99)
Glucose-Capillary: 122 mg/dL — ABNORMAL HIGH (ref 65–99)
Glucose-Capillary: 151 mg/dL — ABNORMAL HIGH (ref 65–99)

## 2017-04-14 LAB — BASIC METABOLIC PANEL
ANION GAP: 11 (ref 5–15)
BUN: 36 mg/dL — ABNORMAL HIGH (ref 6–20)
CO2: 29 mmol/L (ref 22–32)
Calcium: 7.9 mg/dL — ABNORMAL LOW (ref 8.9–10.3)
Chloride: 91 mmol/L — ABNORMAL LOW (ref 101–111)
Creatinine, Ser: 1.94 mg/dL — ABNORMAL HIGH (ref 0.61–1.24)
GFR calc Af Amer: 38 mL/min — ABNORMAL LOW (ref >60)
GFR, EST NON AFRICAN AMERICAN: 33 mL/min — AB (ref >60)
GLUCOSE: 92 mg/dL (ref 65–99)
POTASSIUM: 3.4 mmol/L — AB (ref 3.5–5.1)
Sodium: 131 mmol/L — ABNORMAL LOW (ref 135–145)

## 2017-04-14 LAB — CBC WITH DIFFERENTIAL/PLATELET
Basophils Absolute: 0 10*3/uL (ref 0.0–0.1)
Basophils Relative: 0 %
EOS PCT: 0 %
Eosinophils Absolute: 0 10*3/uL (ref 0.0–0.7)
HEMATOCRIT: 26.6 % — AB (ref 39.0–52.0)
Hemoglobin: 9.1 g/dL — ABNORMAL LOW (ref 13.0–17.0)
LYMPHS ABS: 2 10*3/uL (ref 0.7–4.0)
Lymphocytes Relative: 11 %
MCH: 29.8 pg (ref 26.0–34.0)
MCHC: 34.2 g/dL (ref 30.0–36.0)
MCV: 87.2 fL (ref 78.0–100.0)
MONO ABS: 1.3 10*3/uL — AB (ref 0.1–1.0)
Monocytes Relative: 7 %
Neutro Abs: 15.1 10*3/uL — ABNORMAL HIGH (ref 1.7–7.7)
Neutrophils Relative %: 82 %
Platelets: 434 10*3/uL — ABNORMAL HIGH (ref 150–400)
RBC: 3.05 MIL/uL — AB (ref 4.22–5.81)
RDW: 15.1 % (ref 11.5–15.5)
WBC: 18.4 10*3/uL — AB (ref 4.0–10.5)

## 2017-04-14 LAB — COOXEMETRY PANEL
Carboxyhemoglobin: 2 % — ABNORMAL HIGH (ref 0.5–1.5)
Methemoglobin: 0.7 % (ref 0.0–1.5)
O2 Saturation: 91.5 %
Total hemoglobin: 9.5 g/dL — ABNORMAL LOW (ref 12.0–16.0)

## 2017-04-14 MED ORDER — SPIRONOLACTONE 25 MG PO TABS
25.0000 mg | ORAL_TABLET | Freq: Every day | ORAL | Status: DC
  Administered 2017-04-15 – 2017-04-18 (×4): 25 mg via ORAL
  Filled 2017-04-14 (×5): qty 1

## 2017-04-14 MED ORDER — SPIRONOLACTONE 12.5 MG HALF TABLET
12.5000 mg | ORAL_TABLET | Freq: Once | ORAL | Status: AC
  Administered 2017-04-14: 12.5 mg via ORAL
  Filled 2017-04-14: qty 1

## 2017-04-14 NOTE — Progress Notes (Signed)
6 Days Post-Op Procedure(s) (LRB): CORONARY ARTERY BYPASS GRAFTING (CABG) TIMES TWO USING LEFT INTERNAL MAMMARY ARTERY AND LEFT SAPHENOUS LEG VEIN HARVESTED ENDOSCOPICALLY.  LEG VEIN ALSO HARVESTED FROM THE RIGHT LEG (N/A) TRANSESOPHAGEAL ECHOCARDIOGRAM (TEE) (N/A) Subjective: Creat stable on lasix drip ready for stepdown soon incision clean , dry  Objective: Vital signs in last 24 hours: Temp:  [98 F (36.7 C)-98.9 F (37.2 C)] 98.2 F (36.8 C) (12/16 1116) Pulse Rate:  [78-96] 78 (12/16 1100) Cardiac Rhythm: Normal sinus rhythm (12/16 1100) Resp:  [10-18] 10 (12/16 1100) BP: (88-125)/(55-79) 112/63 (12/16 1100) SpO2:  [91 %-100 %] 98 % (12/16 1100) Weight:  [211 lb 13.8 oz (96.1 kg)] 211 lb 13.8 oz (96.1 kg) (12/16 0600)  Hemodynamic parameters for last 24 hours: CVP:  [9 mmHg-16 mmHg] 13 mmHg  Intake/Output from previous day: 12/15 0701 - 12/16 0700 In: 786.9 [P.O.:360; I.V.:326.9; IV Piggyback:100] Out: 8309 [Urine:3925] Intake/Output this shift: Total I/O In: 290 [P.O.:240; I.V.:50] Out: 975 [Urine:975]       Exam    General- alert and comfortable   Lungs- clear without rales, wheezes   Cor- regular rate and rhythm, no murmur , gallop   Abdomen- soft, non-tender   Extremities - warm, non-tender, minimal edema   Neuro- oriented, appropriate, no focal weakness   Lab Results: Recent Labs    04/12/17 0402 04/13/17 0328  WBC 23.8* 21.3*  HGB 8.8* 9.2*  HCT 25.7* 26.8*  PLT 338 355   BMET:  Recent Labs    04/13/17 0328 04/14/17 0535  NA 127* 131*  K 3.3* 3.4*  CL 89* 91*  CO2 24 29  GLUCOSE 168* 92  BUN 40* 36*  CREATININE 1.95* 1.94*  CALCIUM 7.1* 7.9*    PT/INR: No results for input(s): LABPROT, INR in the last 72 hours. ABG    Component Value Date/Time   PHART 7.398 04/08/2017 1843   HCO3 23.2 04/08/2017 1843   TCO2 22 04/09/2017 1730   ACIDBASEDEF 2.0 04/08/2017 1843   O2SAT 91.5 04/14/2017 0530   CBG (last 3)  Recent Labs     04/13/17 2123 04/14/17 0719 04/14/17 1114  GLUCAP 119* 141* 122*    Assessment/Plan: S/P Procedure(s) (LRB): CORONARY ARTERY BYPASS GRAFTING (CABG) TIMES TWO USING LEFT INTERNAL MAMMARY ARTERY AND LEFT SAPHENOUS LEG VEIN HARVESTED ENDOSCOPICALLY.  LEG VEIN ALSO HARVESTED FROM THE RIGHT LEG (N/A) TRANSESOPHAGEAL ECHOCARDIOGRAM (TEE) (N/A) Cont current care Wbc 21k on Zosyn   LOS: 14 days    [name] [name] 04/14/2017

## 2017-04-14 NOTE — Progress Notes (Signed)
Patient ID: [name] [name], male   DOB: 07-06-1945, 71 y.o.   MRN: [id number]     Advanced Heart Failure Rounding Note  Subjective:    POD #4 s/p CABG with LIMA to LAD and SVG to OM on 12/10  Milrinone off 12/14, co-ox not accurate this morning (91%).  CVP 12-13.    He continues on Lasix gtt with good diuresis, weight down 7 lbs.  Creatinine stable at 1.94.     Remains on Zosyn.    He is in NSR on amiodarone.    Walking in hall.    Objective:   Weight Range: 211 lb 13.8 oz (96.1 kg) Body mass index is 27.2 kg/m.   Vital Signs:   Temp:  [98 F (36.7 C)-98.9 F (37.2 C)] 98.2 F (36.8 C) (12/16 0722) Pulse Rate:  [79-96] 80 (12/16 0800) Resp:  [10-18] 11 (12/16 0800) BP: (97-125)/(55-79) 124/58 (12/16 0800) SpO2:  [91 %-100 %] 100 % (12/16 0800) Weight:  [211 lb 13.8 oz (96.1 kg)] 211 lb 13.8 oz (96.1 kg) (12/16 0600) Last BM Date: 04/14/17 Weight change: Filed Weights   04/12/17 0500 04/13/17 0700 04/14/17 0600  Weight: 212 lb 15.4 oz (96.6 kg) 218 lb 4.1 oz (99 kg) 211 lb 13.8 oz (96.1 kg)   Intake/Output:   Intake/Output Summary (Last 24 hours) at 04/14/2017 0935 Last data filed at 04/14/2017 0800 Gross per 24 hour  Intake 753.5 ml  Output 4175 ml  Net -3421.5 ml    Physical Exam   General: NAD Neck: JVP 10-12, no thyromegaly or thyroid nodule.  Lungs: Clear to auscultation bilaterally with normal respiratory effort. CV: Nondisplaced PMI.  Heart regular S1/S2, no S3/S4, no murmur.  2+ edema to knees bilaterally.   Abdomen: Soft, nontender, no hepatosplenomegaly, no distention.  Skin: Intact without lesions or rashes.  Neurologic: Alert and oriented x 3.  Psych: Normal affect. Extremities: No clubbing or cyanosis.  HEENT: Normal.    Telemetry   NSR. Personally reviewed.   Labs    CBC Recent Labs    04/12/17 0402 04/13/17 0328  WBC 23.8* 21.3*  HGB 8.8* 9.2*  HCT 25.7* 26.8*  MCV 84.3 84.5  PLT 338 237   Basic Metabolic Panel Recent  Labs    04/13/17 0328 04/14/17 0535  NA 127* 131*  K 3.3* 3.4*  CL 89* 91*  CO2 24 29  GLUCOSE 168* 92  BUN 40* 36*  CREATININE 1.95* 1.94*  CALCIUM 7.1* 7.9*  MG 2.0  --    Liver Function Tests No results for input(s): AST, ALT, ALKPHOS, BILITOT, PROT, ALBUMIN in the last 72 hours. No results for input(s): LIPASE, AMYLASE in the last 72 hours. Cardiac Enzymes No results for input(s): CKTOTAL, CKMB, CKMBINDEX, TROPONINI in the last 72 hours.  BNP: BNP (last 3 results) Recent Labs    03/31/17 2341  BNP 786.9*    ProBNP (last 3 results) No results for input(s): PROBNP in the last 8760 hours.   D-Dimer No results for input(s): DDIMER in the last 72 hours. Hemoglobin A1C No results for input(s): HGBA1C in the last 72 hours. Fasting Lipid Panel No results for input(s): CHOL, HDL, LDLCALC, TRIG, CHOLHDL, LDLDIRECT in the last 72 hours. Thyroid Function Tests No results for input(s): TSH, T4TOTAL, T3FREE, THYROIDAB in the last 72 hours.  Invalid input(s): FREET3  Other results:   Imaging    Dg Chest Port 1 View  Result Date: 04/14/2017 CLINICAL DATA:  71 year old male postoperative day 6 status  post CABG. EXAM: PORTABLE CHEST 1 VIEW COMPARISON:  04/12/2017 and earlier. FINDINGS: Portable AP semi upright view at 0425 hours. Right PICC line remains. Stable cardiac size and mediastinal contours. Sequelae of CABG. Stable lung volumes. No pneumothorax. Small left pleural effusion is stable. No consolidation. Stable pulmonary vascularity with superimposed suspected chronic increased interstitial opacity. No areas of worsening ventilation. IMPRESSION: Stable ventilation. Small left pleural effusion. Suspected chronic bilateral pulmonary interstitial changes with no overt edema. Electronically Signed   By: Genevie Ann M.D.   On: 04/14/2017 07:12     Medications:     Scheduled Medications: . amiodarone  400 mg Oral BID  . aspirin EC  325 mg Oral Daily   Or  . aspirin  324  mg Per Tube Daily  . atorvastatin  80 mg Oral QHS  . Chlorhexidine Gluconate Cloth  6 each Topical Daily  . digoxin  0.125 mg Oral Daily  . enoxaparin (LOVENOX) injection  40 mg Subcutaneous QHS  . insulin aspart  0-24 Units Subcutaneous TID WC  . insulin detemir  20 Units Subcutaneous Daily  . mouth rinse  15 mL Mouth Rinse BID  . pantoprazole  40 mg Oral Daily  . potassium chloride  40 mEq Oral TID  . spironolactone  12.5 mg Oral Daily    Infusions: . furosemide (LASIX) infusion 10 mg/hr (04/14/17 0800)  . piperacillin-tazobactam (ZOSYN)  IV Stopped (04/14/17 0600)    PRN Medications: morphine injection, ondansetron (ZOFRAN) IV, oxyCODONE, sodium chloride flush, traMADol   Patient Profile   71 y/o with anterior STEMI who developed cardiogenic shock. 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 Impella placed for stabilization of shock.   S/p CABG with LIMA to LAD and SVG to OM on 04/08/17  Assessment/Plan   1. Cardiogenic shock/acute systolic CHF: Ischemic cardiomyopathy, EF with EF 15-20%.  He is now off milrinone.  Co-ox not accurate today, need to re-send.  CVP 12-13.  He diuresed well yesterday, weight down and creatinine stable. - Re-send co-ox.   - Continue Lasix gtt 10 mg/hr for today with volume overload, good UOP yesterday. Suspect can transition to po tomorrow. - Continue digoxin 0.125.  - Increase spironolactone to 25 mg daily.  - No ARB/ARNI yet with elevated creatinine.  Possible low dose Coreg tomorrow if co-ox ok.   2. CAD: s/p CABG 12/10. Stable post-op. No ischemic symptoms  - Continue ASA and statin.  3.  ID -> Sepsis unclear source. UCx with pseudomonas. BCx NGTD. Sputum culture negative. CCM treating for AECOPD. WBC trending down.  - Continue zosyn  4. Hyponatremia: Na up to 131 today.  - Free H2O restrict.  5. Tobacco abuse: Smoked 1 ppd PTA for > 50 years.  - Understands need for smoking cessation    6. Hx of bladder CA: Pt states he had multiple rounds of Bacillus Calmette-Guerin (BCG) therapy. No change.  7. Scrotal swelling: [place] 04/04/17 with R hydrocele and ? Epididymitis. - On broad spectrum ABX already.  8. AKI: Creatinine stable.  Watch closely with diuresis.  9. Atrial fibrillation: Paroxysmal, currently NSR on amiodarone.   - Continue po amiodarone.  - When pacing wires are out, plan for Eliquis.   Length of Stay: [place], MD  04/14/2017, 9:35 AM  Advanced Heart Failure Team Pager [phone] (M-F; 7a - 4p)  Please contact [facility] for night-coverage after hours (4p -7a ) and weekends on [email or web address]  Loralie Champagne, MD  9:35 AM 04/14/2017

## 2017-04-15 ENCOUNTER — Other Ambulatory Visit (HOSPITAL_COMMUNITY): Payer: Medicare Other

## 2017-04-15 LAB — CBC WITH DIFFERENTIAL/PLATELET
BASOS ABS: 0 10*3/uL (ref 0.0–0.1)
Basophils Relative: 0 %
Eosinophils Absolute: 0.2 10*3/uL (ref 0.0–0.7)
Eosinophils Relative: 1 %
HCT: 27.3 % — ABNORMAL LOW (ref 39.0–52.0)
Hemoglobin: 9.3 g/dL — ABNORMAL LOW (ref 13.0–17.0)
LYMPHS ABS: 1.7 10*3/uL (ref 0.7–4.0)
Lymphocytes Relative: 10 %
MCH: 29.8 pg (ref 26.0–34.0)
MCHC: 34.1 g/dL (ref 30.0–36.0)
MCV: 87.5 fL (ref 78.0–100.0)
MONO ABS: 1.7 10*3/uL — AB (ref 0.1–1.0)
Monocytes Relative: 10 %
NEUTROS ABS: 13.5 10*3/uL — AB (ref 1.7–7.7)
Neutrophils Relative %: 79 %
Platelets: 442 10*3/uL — ABNORMAL HIGH (ref 150–400)
RBC: 3.12 MIL/uL — AB (ref 4.22–5.81)
RDW: 15.2 % (ref 11.5–15.5)
WBC: 17.1 10*3/uL — AB (ref 4.0–10.5)

## 2017-04-15 LAB — BASIC METABOLIC PANEL
ANION GAP: 12 (ref 5–15)
BUN: 29 mg/dL — ABNORMAL HIGH (ref 6–20)
CHLORIDE: 89 mmol/L — AB (ref 101–111)
CO2: 29 mmol/L (ref 22–32)
Calcium: 8 mg/dL — ABNORMAL LOW (ref 8.9–10.3)
Creatinine, Ser: 1.86 mg/dL — ABNORMAL HIGH (ref 0.61–1.24)
GFR calc non Af Amer: 35 mL/min — ABNORMAL LOW (ref >60)
GFR, EST AFRICAN AMERICAN: 40 mL/min — AB (ref >60)
Glucose, Bld: 169 mg/dL — ABNORMAL HIGH (ref 65–99)
POTASSIUM: 4 mmol/L (ref 3.5–5.1)
Sodium: 130 mmol/L — ABNORMAL LOW (ref 135–145)

## 2017-04-15 LAB — GLUCOSE, CAPILLARY
GLUCOSE-CAPILLARY: 169 mg/dL — AB (ref 65–99)
GLUCOSE-CAPILLARY: 103 mg/dL — AB (ref 65–99)
GLUCOSE-CAPILLARY: 132 mg/dL — AB (ref 65–99)
Glucose-Capillary: 121 mg/dL — ABNORMAL HIGH (ref 65–99)

## 2017-04-15 LAB — COOXEMETRY PANEL
Carboxyhemoglobin: 1.4 % (ref 0.5–1.5)
Methemoglobin: 1.3 % (ref 0.0–1.5)
O2 Saturation: 62 %
Total hemoglobin: 9.5 g/dL — ABNORMAL LOW (ref 12.0–16.0)

## 2017-04-15 LAB — MAGNESIUM: MAGNESIUM: 1.8 mg/dL (ref 1.7–2.4)

## 2017-04-15 MED ORDER — POTASSIUM CHLORIDE CRYS ER 20 MEQ PO TBCR
40.0000 meq | EXTENDED_RELEASE_TABLET | Freq: Every day | ORAL | Status: DC
  Administered 2017-04-15 – 2017-04-17 (×3): 40 meq via ORAL
  Filled 2017-04-15 (×3): qty 2

## 2017-04-15 MED ORDER — SODIUM CHLORIDE 0.9% FLUSH: 3.0000 mL | INTRAVENOUS | Status: DC | PRN

## 2017-04-15 MED ORDER — ONDANSETRON HCL 4 MG PO TABS: 4.0000 mg | ORAL_TABLET | Freq: Four times a day (QID) | ORAL | Status: DC | PRN

## 2017-04-15 MED ORDER — ASPIRIN EC 325 MG PO TBEC
325.0000 mg | DELAYED_RELEASE_TABLET | Freq: Every day | ORAL | Status: DC
  Administered 2017-04-15 – 2017-04-18 (×4): 325 mg via ORAL
  Filled 2017-04-15 (×3): qty 1

## 2017-04-15 MED ORDER — MAGNESIUM SULFATE 2 GM/50ML IV SOLN
2.0000 g | Freq: Once | INTRAVENOUS | Status: AC
  Administered 2017-04-15: 2 g via INTRAVENOUS
  Filled 2017-04-15: qty 50

## 2017-04-15 MED ORDER — ENOXAPARIN SODIUM 30 MG/0.3ML ~~LOC~~ SOLN
30.0000 mg | Freq: Every day | SUBCUTANEOUS | Status: DC
  Administered 2017-04-15 – 2017-04-17 (×3): 30 mg via SUBCUTANEOUS
  Filled 2017-04-15 (×3): qty 0.3

## 2017-04-15 MED ORDER — INSULIN ASPART 100 UNIT/ML ~~LOC~~ SOLN
0.0000 [IU] | Freq: Three times a day (TID) | SUBCUTANEOUS | Status: DC
  Administered 2017-04-15 – 2017-04-17 (×5): 2 [IU] via SUBCUTANEOUS

## 2017-04-15 MED ORDER — MOVING RIGHT ALONG BOOK
Freq: Once | Status: AC
  Administered 2017-04-15: 11:00:00
  Filled 2017-04-15: qty 1

## 2017-04-15 MED ORDER — SODIUM CHLORIDE 0.9 % IV SOLN: 250.0000 mL | INTRAVENOUS | Status: DC | PRN

## 2017-04-15 MED ORDER — BISACODYL 5 MG PO TBEC: 10.0000 mg | DELAYED_RELEASE_TABLET | Freq: Every day | ORAL | Status: DC | PRN

## 2017-04-15 MED ORDER — OXYCODONE HCL 5 MG PO TABS
5.0000 mg | ORAL_TABLET | ORAL | Status: DC | PRN
  Administered 2017-04-15 – 2017-04-18 (×5): 5 mg via ORAL
  Filled 2017-04-15 (×5): qty 1

## 2017-04-15 MED ORDER — FUROSEMIDE 80 MG PO TABS
80.0000 mg | ORAL_TABLET | Freq: Every day | ORAL | Status: DC
  Administered 2017-04-15 – 2017-04-18 (×4): 80 mg via ORAL
  Filled 2017-04-15 (×4): qty 1

## 2017-04-15 MED ORDER — BISACODYL 10 MG RE SUPP: 10.0000 mg | Freq: Every day | RECTAL | Status: DC | PRN

## 2017-04-15 MED ORDER — SODIUM CHLORIDE 0.9% FLUSH
3.0000 mL | Freq: Two times a day (BID) | INTRAVENOUS | Status: DC
  Administered 2017-04-15 – 2017-04-16 (×3): 3 mL via INTRAVENOUS
  Administered 2017-04-16: 10 mL via INTRAVENOUS

## 2017-04-15 MED ORDER — ONDANSETRON HCL 4 MG/2ML IJ SOLN: 4.0000 mg | Freq: Four times a day (QID) | INTRAMUSCULAR | Status: DC | PRN

## 2017-04-15 NOTE — Care Management Note (Addendum)
Case Management Note  Patient Details  Name: [name] [name] MRN: [id number] Date of Birth: 1946/03/15  Subjective/Objective:  Presents with STEMI,  From home with wife, who can asst 24/7,  cath with 3 vessel dz, impella placed,  developed fever 104 and wbc spike to 38.8, with suspected pna and susppected sepsis.    12/12 [place], BSN - POD 2 CABG, conts on epi, milrinone and levophed, cxr looks wet per MD note will start lasix, wbc elevated conts on zosyn.    12/17 [place], BSN -   POD 7 CABG,  off lasix drip, wbc elevated, no evidence of infection.  Per pt eval rec HHPT, rolling walker and 3 n 1.  Wife chose [facility], referral made to Butch Penny with [facility] . His PCP is Dr. Kathryne Eriksson.                                 Action/Plan: DC home with HHPT , 3 n 1, rolling walker when medically ready.  Need order for HHPT with face to face.  Expected Discharge Date:                  Expected Discharge Plan:  [place]  In-House Referral:     Discharge planning Services  CM Consult  Post Acute Care Choice:  Home Health Choice offered to:  Spouse  DME Arranged:  3-N-1, Walker rolling DME Agency:     HH Arranged:  PT [place]:  [place]  Status of Service:  Completed, signed off  If discussed at [place] of Stay Meetings, dates discussed:    Additional Comments:  Zenon Mayo, RN 04/15/2017, 3:02 PM

## 2017-04-15 NOTE — Progress Notes (Signed)
      [address]       [place] [address]             [phone]      No complaints  BP 119/62   Pulse 83   Temp 97.9 F (36.6 C) (Oral)   Resp 16   Ht 6\' 2"  (1.88 m)   Wt 204 lb 1.6 oz (92.6 kg)   SpO2 95%   BMI 26.20 kg/m    Intake/Output Summary (Last 24 hours) at 04/15/2017 1852 Last data filed at 04/15/2017 1813 Gross per 24 hour  Intake 1070 ml  Output 3300 ml  Net -2230 ml   Awaiting step down bed Remo Lipps C. Roxan Hockey, MD [facility] [phone]

## 2017-04-15 NOTE — Progress Notes (Signed)
Patient ID: [name] [name], male   DOB: 1945-09-03, 71 y.o.   MRN: [id number]     Advanced Heart Failure Rounding Note  Subjective:    POD #7 s/p CABG with LIMA to LAD and SVG to OM on 12/10  Off vasopressors and inotropes. SBP 102-125. Co-ox 62% this morning.    CVP 8. On Lasix gtt. Cr stable at 1.86.  Weight 211-> 204   Remains on Zosyn. WBC trending down, 17.1. No further fevers.   Remains in NSR on PO amiodarone.    Having trouble sleeping, but feeling well and stronger. Denies SOB. No CP.   Objective:   Weight Range: 204 lb 1.6 oz (92.6 kg) Body mass index is 26.2 kg/m.   Vital Signs:   Temp:  [98.1 F (36.7 C)-99.2 F (37.3 C)] 98.9 F (37.2 C) (12/17 0400) Pulse Rate:  [44-87] 81 (12/17 0700) Resp:  [10-26] 14 (12/17 0700) BP: (88-126)/(53-66) 119/63 (12/17 0700) SpO2:  [90 %-100 %] 99 % (12/17 0700) Weight:  [204 lb 1.6 oz (92.6 kg)] 204 lb 1.6 oz (92.6 kg) (12/17 0600) Last BM Date: 04/14/17 Weight change: Filed Weights   04/13/17 0700 04/14/17 0600 04/15/17 0600  Weight: 218 lb 4.1 oz (99 kg) 211 lb 13.8 oz (96.1 kg) 204 lb 1.6 oz (92.6 kg)   Intake/Output:   Intake/Output Summary (Last 24 hours) at 04/15/2017 0811 Last data filed at 04/15/2017 0700 Gross per 24 hour  Intake 980 ml  Output 4075 ml  Net -3095 ml    Physical Exam  CVP 8  General: appears well, sitting up in chair in no acute distress  HENT: Normal  Cardiac: PMI nondisplaced. Regular rate and rhythm, nl S1/S2, no murmurs, rubs or gallops. JVP ~7 Pulm: CTAB, no wheezes or crackles, no increased work of breathing. Chest wall incision healing well. No drainage noted.  Abd: soft, NTND, bowel sounds present  Neuro: A&Ox3, no gross deficits noted  Ext: warm and well perfused, no clubbing or cyanosis. Trivial edema. SVG harvest site improved   Telemetry   NSR. Personally reviewed.   Labs    CBC Recent Labs    04/14/17 1115 04/15/17 0256  WBC 18.4* 17.1*  NEUTROABS 15.1* 13.5*    HGB 9.1* 9.3*  HCT 26.6* 27.3*  MCV 87.2 87.5  PLT 434* 409*   Basic Metabolic Panel Recent Labs    04/13/17 0328 04/14/17 0535 04/15/17 0256  NA 127* 131* 130*  K 3.3* 3.4* 4.0  CL 89* 91* 89*  CO2 24 29 29   GLUCOSE 168* 92 169*  BUN 40* 36* 29*  CREATININE 1.95* 1.94* 1.86*  CALCIUM 7.1* 7.9* 8.0*  MG 2.0  --  1.8   Liver Function Tests No results for input(s): AST, ALT, ALKPHOS, BILITOT, PROT, ALBUMIN in the last 72 hours. No results for input(s): LIPASE, AMYLASE in the last 72 hours. Cardiac Enzymes No results for input(s): CKTOTAL, CKMB, CKMBINDEX, TROPONINI in the last 72 hours.  BNP: BNP (last 3 results) Recent Labs    03/31/17 2341  BNP 786.9*    ProBNP (last 3 results) No results for input(s): PROBNP in the last 8760 hours.   D-Dimer No results for input(s): DDIMER in the last 72 hours. Hemoglobin A1C No results for input(s): HGBA1C in the last 72 hours. Fasting Lipid Panel No results for input(s): CHOL, HDL, LDLCALC, TRIG, CHOLHDL, LDLDIRECT in the last 72 hours. Thyroid Function Tests No results for input(s): TSH, T4TOTAL, T3FREE, THYROIDAB in the last 72 hours.  Invalid input(s): FREET3  Other results:   Imaging    No results found.   Medications:     Scheduled Medications: . amiodarone  400 mg Oral BID  . aspirin EC  325 mg Oral Daily   Or  . aspirin  324 mg Per Tube Daily  . atorvastatin  80 mg Oral QHS  . Chlorhexidine Gluconate Cloth  6 each Topical Daily  . digoxin  0.125 mg Oral Daily  . enoxaparin (LOVENOX) injection  40 mg Subcutaneous QHS  . insulin aspart  0-24 Units Subcutaneous TID WC  . insulin detemir  20 Units Subcutaneous Daily  . mouth rinse  15 mL Mouth Rinse BID  . pantoprazole  40 mg Oral Daily  . potassium chloride  40 mEq Oral TID  . spironolactone  25 mg Oral Daily    Infusions: . furosemide (LASIX) infusion 10 mg/hr (04/14/17 1900)  . piperacillin-tazobactam (ZOSYN)  IV Stopped (04/15/17 0457)     PRN Medications: morphine injection, ondansetron (ZOFRAN) IV, oxyCODONE, sodium chloride flush, traMADol   Patient Profile   71 y/o with anterior STEMI who developed cardiogenic shock. 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 Impella placed for stabilization of shock.   S/p CABG with LIMA to LAD and SVG to OM on 04/08/17  Assessment/Plan   1. Cardiogenic shock/acute systolic CHF: Ischemic cardiomyopathy, EF with EF 15-20%. Now off vasopressors and inotropes. BP stable and Co-ox 62%. On Lasix gtt and diuresing well, weight down 7 pounds. CVP 8 - Consider stopping Lasix gtt and start PO Lasix today.  - Continue digoxin 0.125.  - Start spironolactone to 25 mg daily.  - No ARB/ARNI yet with elevated creatinine.   - Transfer to floor today if off Lasix gtt   2. CAD: s/p CABG 12/10. Stable post-op. No ischemic symptoms  - Continue ASA and statin.   3.  ID -> Sepsis unclear source. UCx with pseudomonas. BCx NGTD. Sputum culture negative. CCM treating for AECOPD. WBC trending down.  - Continue zosyn   4. Hyponatremia: Na up to 130 today.  - Free H2O restrict.   5. Tobacco abuse: Smoked 1 ppd PTA for > 50 years.  - Understands need for smoking cessation   6. Hx of bladder CA: Pt states he had multiple rounds of Bacillus Calmette-Guerin (BCG) therapy. No change.   7. Scrotal swelling: [place] 04/04/17 with R hydrocele and ? Epididymitis. - On broad spectrum ABX already.   8. AKI: Creatinine stable.  Watch closely with diuresis.   9. Atrial fibrillation: Paroxysmal, currently NSR on amiodarone.   - Continue po amiodarone.  - When pacing wires are out, plan for Eliquis.   Length of Stay: [place], MD  04/15/2017, 8:11 AM  Advanced Heart Failure Team Pager [phone] (M-F; [place])  Please contact [facility] for night-coverage after hours (4p -7a ) and weekends on [email or web address]  Welford Roche, MD   8:11 AM 04/15/2017  Patient seen and examined with the above-signed Advanced Practice Provider and/or Housestaff. I personally reviewed laboratory data, imaging studies and relevant notes. I independently examined the patient and formulated the important aspects of the plan. I have edited the note to reflect any of my changes or salient points. I have personally discussed the plan with the patient and/or family.  Continues to improve post CABG. Volume status elevated but improved with lasix gtt. Will switch to po diuretics. Maintaining NSR on  po amio. Will continue but aim to stop amio as soon as possible post-op with underlying lung disease. WBC coming down. Can stop Zosyn tomorrow. D/w PharmD. PT has seen. Not candidate for CIR. Continue PT. Will likely need HHPT and CR. Can go to floor.   Glori Bickers, MD  10:01 AM

## 2017-04-15 NOTE — Progress Notes (Signed)
Physical Therapy Treatment Patient Details Name: [name] [name] MRN: [id number] DOB: [birth year] Today's Date: 04/15/2017    History of Present Illness pt is a 71 y/o male with pmh significant for HTN, DM, Bladder CA, but no cardiac hx, presents to the ED with acute complaints of dyspnea, thought to be due to flu symptoms, but found to have STEMI by ECG, ARF with hypoxia on 6L and acute systolic HF.   S/P Cath.  S/P CABGx2 on 12/11.    PT Comments    Pt performed gait training during session but limited to gait as he was wanting to bathe with his wife as PTA entered the room.  Pt remains to require cues for safety and recalling/maintaining sternal precautions.  Pt adamant to use EVA walker despite encouragement to perform with RW as he would be using a RW at d/c.  Plan next session to encourage RW use during gait training and continuation of education in regards to sternal precautions and compliance.  Plan to return home with HHPT and support from spouse remains appropriate.   BP pre session:114/82 BP post session:81/62  Mild complaints of dizziness.    Follow Up Recommendations  Home health PT;Supervision/Assistance - 24 hour     Equipment Recommendations  3in1 (PT);Rolling walker with 5" wheels(TBD)    Recommendations for Other Services       Precautions / Restrictions Precautions Precautions: Fall Precaution Comments: Watch HR--Labile on eval, sternal precautions Restrictions Weight Bearing Restrictions: Yes(sternal precautions)    Mobility  Bed Mobility Overal bed mobility: Needs Assistance Bed Mobility: Supine to Sit     Supine to sit: Mod assist     General bed mobility comments: Pt unable to elevate trunk without support and required mod assist to move into sitting.  Educated patient that rolling woung improve ease but patient adamant to remain in supine with legs off side of bed.    Transfers Overall transfer level: Needs assistance   Transfers: Sit to/from  Stand Sit to Stand: Min assist         General transfer comment: cues for sternal precaution, patient attempting to pull on EVA walker to ascend into standing cues for hand placement on knees.    Ambulation/Gait Ambulation/Gait assistance: Min assist Ambulation Distance (Feet): 240 Feet Assistive device: (EVA walker) Gait Pattern/deviations: Step-through pattern;Trunk flexed;Narrow base of support   Gait velocity interpretation: Below normal speed for age/gender General Gait Details: mildly staggered gait with wide BOS.  Pt desaturated with activity and mild DOE observed with pursed lip breathing patient improved to greater than 90%.  Unclear if reading on monitor was acurate as it reports desat to 76% and quickly returned to 98%.  Pt on RA throughout.     Stairs            Wheelchair Mobility    Modified Rankin (Stroke Patients Only)       Balance Overall balance assessment: Needs assistance   Sitting balance-Leahy Scale: Good       Standing balance-Leahy Scale: Poor Standing balance comment: reliant on external support (EVA walker)                            Cognition Arousal/Alertness: Awake/alert Behavior During Therapy: WFL for tasks assessed/performed Overall Cognitive Status: Within Functional Limits for tasks assessed  Exercises      General Comments        Pertinent Vitals/Pain Pain Assessment: 0-10 Pain Score: 4  Pain Location: sternal Pain Descriptors / Indicators: Aching Pain Intervention(s): Monitored during session;Repositioned    Home Living                      Prior Function            PT Goals (current goals can now be found in the care plan section) Acute Rehab PT Goals Patient Stated Goal: independent Potential to Achieve Goals: Good Progress towards PT goals: Progressing toward goals    Frequency    Min 3X/week      PT Plan Current plan  remains appropriate    Co-evaluation              AM-PAC PT "6 Clicks" Daily Activity  Outcome Measure  Difficulty turning over in bed (including adjusting bedclothes, sheets and blankets)?: Unable Difficulty moving from lying on back to sitting on the side of the bed? : Unable Difficulty sitting down on and standing up from a chair with arms (e.g., wheelchair, bedside commode, etc,.)?: Unable Help needed moving to and from a bed to chair (including a wheelchair)?: A Little Help needed walking in hospital room?: A Little Help needed climbing 3-5 steps with a railing? : A Lot 6 Click Score: 11    End of Session Equipment Utilized During Treatment: Gait belt Activity Tolerance: Patient tolerated treatment well Patient left: in bed;with call bell/phone within reach;with bed alarm set Nurse Communication: Mobility status PT Visit Diagnosis: Unsteadiness on feet (R26.81);Difficulty in walking, not elsewhere classified (R26.2);Pain Pain - Right/Left: (midline) Pain - part of body: (sternum)     Time: 8250-5397 PT Time Calculation (min) (ACUTE ONLY): 11 min  Charges:  $Gait Training: 8-22 mins                    G Codes:       Governor Rooks, PTA pager [phone]    Cristela Blue 04/15/2017, 4:06 PM

## 2017-04-15 NOTE — Progress Notes (Addendum)
TCTS DAILY ICU PROGRESS NOTE                   [address]            [place] [address]          [phone]   7 Days Post-Op Procedure(s) (LRB): CORONARY ARTERY BYPASS GRAFTING (CABG) TIMES TWO USING LEFT INTERNAL MAMMARY ARTERY AND LEFT SAPHENOUS LEG VEIN HARVESTED ENDOSCOPICALLY.  LEG VEIN ALSO HARVESTED FROM THE RIGHT LEG (N/A) TRANSESOPHAGEAL ECHOCARDIOGRAM (TEE) (N/A)  Total Length of Stay:  LOS: 15 days   Subjective: Feels like he is progressing well  Objective: Vital signs in last 24 hours: Temp:  [98.1 F (36.7 C)-99.2 F (37.3 C)] 98.9 F (37.2 C) (12/17 0400) Pulse Rate:  [44-87] 77 (12/17 0800) Cardiac Rhythm: Normal sinus rhythm (12/17 0743) Resp:  [10-26] 14 (12/17 0800) BP: (88-126)/(53-66) 119/63 (12/17 0700) SpO2:  [90 %-100 %] 100 % (12/17 0800) Weight:  [204 lb 1.6 oz (92.6 kg)] 204 lb 1.6 oz (92.6 kg) (12/17 0600)  Filed Weights   04/13/17 0700 04/14/17 0600 04/15/17 0600  Weight: 218 lb 4.1 oz (99 kg) 211 lb 13.8 oz (96.1 kg) 204 lb 1.6 oz (92.6 kg)    Weight change: -12.2 oz (-3.521 kg)   Hemodynamic parameters for last 24 hours: CVP:  [8 mmHg-12 mmHg] 8 mmHg  Intake/Output from previous day: 12/16 0701 - 12/17 0700 In: 1000 [P.O.:600; I.V.:250; IV Piggyback:150] Out: 1540 [Urine:4325]  Intake/Output this shift: No intake/output data recorded.  Current Meds: Scheduled Meds: . amiodarone  400 mg Oral BID  . aspirin EC  325 mg Oral Daily   Or  . aspirin  324 mg Per Tube Daily  . atorvastatin  80 mg Oral QHS  . Chlorhexidine Gluconate Cloth  6 each Topical Daily  . digoxin  0.125 mg Oral Daily  . enoxaparin (LOVENOX) injection  40 mg Subcutaneous QHS  . insulin aspart  0-24 Units Subcutaneous TID WC  . insulin detemir  20 Units Subcutaneous Daily  . mouth rinse  15 mL Mouth Rinse BID  . pantoprazole  40 mg Oral Daily  . potassium chloride  40 mEq Oral TID  . spironolactone  25 mg Oral Daily   Continuous  Infusions: . furosemide (LASIX) infusion 10 mg/hr (04/14/17 1900)  . piperacillin-tazobactam (ZOSYN)  IV Stopped (04/15/17 0457)   PRN Meds:.morphine injection, ondansetron (ZOFRAN) IV, oxyCODONE, sodium chloride flush, traMADol  General appearance: alert, cooperative and no distress Heart: regular rate and rhythm Lungs: mildly dim in bases Abdomen: benign Extremities: + LE pitting edema Wound: incis healing well  Lab Results: CBC: Recent Labs    04/14/17 1115 04/15/17 0256  WBC 18.4* 17.1*  HGB 9.1* 9.3*  HCT 26.6* 27.3*  PLT 434* 442*   BMET:  Recent Labs    04/14/17 0535 04/15/17 0256  NA 131* 130*  K 3.4* 4.0  CL 91* 89*  CO2 29 29  GLUCOSE 92 169*  BUN 36* 29*  CREATININE 1.94* 1.86*  CALCIUM 7.9* 8.0*    CMET: Lab Results  Component Value Date   WBC 17.1 (H) 04/15/2017   HGB 9.3 (L) 04/15/2017   HCT 27.3 (L) 04/15/2017   PLT 442 (H) 04/15/2017   GLUCOSE 169 (H) 04/15/2017   CHOL 129 04/01/2017   TRIG 93 04/01/2017   HDL 28 (L) 04/01/2017   LDLCALC 82 04/01/2017   ALT 27 03/31/2017   AST 58 (H) 03/31/2017   NA  130 (L) 04/15/2017   K 4.0 04/15/2017   CL 89 (L) 04/15/2017   CREATININE 1.86 (H) 04/15/2017   BUN 29 (H) 04/15/2017   CO2 29 04/15/2017   INR 1.35 04/08/2017   HGBA1C 6.3 (H) 03/31/2017      PT/INR: No results for input(s): LABPROT, INR in the last 72 hours. Radiology: No results found.   Assessment/Plan: S/P Procedure(s) (LRB): CORONARY ARTERY BYPASS GRAFTING (CABG) TIMES TWO USING LEFT INTERNAL MAMMARY ARTERY AND LEFT SAPHENOUS LEG VEIN HARVESTED ENDOSCOPICALLY.  LEG VEIN ALSO HARVESTED FROM THE RIGHT LEG (N/A) TRANSESOPHAGEAL ECHOCARDIOGRAM (TEE) (N/A)  1 steady overall progress 2 sinus rhythm, hemodyn stable 3 conts lasix gtt, BUN and creat slightly improved. Good UO 4 anemia is stable 5 thrombocytopenia is resolved 6 sugar control adeq for ICU setting 7 poss tx out of ICU soon   [name] 04/15/2017 8:31 AM    Wbc elevated, no fever or chills, incisions intact without evidence of infection  Now off lasix per heart failure team Plan transfer to stepdown I have seen and examined [name] [name] and agree with the above assessment  and plan.  [name] MD [place] [phone] Office [phone] 04/15/2017 9:39 AM

## 2017-04-16 ENCOUNTER — Inpatient Hospital Stay (HOSPITAL_COMMUNITY): Payer: Medicare Other

## 2017-04-16 DIAGNOSIS — I361 Nonrheumatic tricuspid (valve) insufficiency: Secondary | ICD-10-CM

## 2017-04-16 LAB — CBC
HCT: 27.8 % — ABNORMAL LOW (ref 39.0–52.0)
Hemoglobin: 9 g/dL — ABNORMAL LOW (ref 13.0–17.0)
MCH: 28.6 pg (ref 26.0–34.0)
MCHC: 32.4 g/dL (ref 30.0–36.0)
MCV: 88.3 fL (ref 78.0–100.0)
Platelets: 405 10*3/uL — ABNORMAL HIGH (ref 150–400)
RBC: 3.15 MIL/uL — ABNORMAL LOW (ref 4.22–5.81)
RDW: 15.6 % — ABNORMAL HIGH (ref 11.5–15.5)
WBC: 15.6 10*3/uL — ABNORMAL HIGH (ref 4.0–10.5)

## 2017-04-16 LAB — COOXEMETRY PANEL
CARBOXYHEMOGLOBIN: 1.7 % — AB (ref 0.5–1.5)
Carboxyhemoglobin: 1.5 % (ref 0.5–1.5)
Methemoglobin: 1.1 % (ref 0.0–1.5)
Methemoglobin: 0.7 % (ref 0.0–1.5)
O2 Saturation: 47.7 %
O2 Saturation: 84.2 %
Total hemoglobin: 9.6 g/dL — ABNORMAL LOW (ref 12.0–16.0)
Total hemoglobin: 10.1 g/dL — ABNORMAL LOW (ref 12.0–16.0)

## 2017-04-16 LAB — ECHOCARDIOGRAM COMPLETE
AVLVOTPG: 3 mmHg
CHL CUP DOP CALC LVOT VTI: 14 cm
CHL CUP STROKE VOLUME: 29 mL
EERAT: 15.24
FS: 18 % — AB (ref 28–44)
Height: 74 in
IVS/LV PW RATIO, ED: 1.18
LA diam index: 1.87 cm/m2
LA vol index: 33.2 mL/m2
LASIZE: 41 mm
LAVOL: 72.8 mL
LAVOLA4C: 60.2 mL
LDCA: 3.8 cm2
LEFT ATRIUM END SYS DIAM: 41 mm
LV PW d: 11 mm — AB (ref 0.6–1.1)
LV SIMPSON'S DISK: 26
LV TDI E'MEDIAL: 4.61
LV sys vol index: 38 mL/m2
LVDIAVOL: 113 mL (ref 62–150)
LVDIAVOLIN: 52 mL/m2
LVEEAVG: 15.24
LVEEMED: 15.24
LVELAT: 7.48 cm/s
LVOT SV: 53 mL
LVOT diameter: 22 mm
LVOTPV: 90 cm/s
LVSYSVOL: 84 mL — AB (ref 21–61)
Lateral S' vel: 9.51 cm/s
MVPG: 5 mmHg
MVPKAVEL: 44.3 m/s
MVPKEVEL: 114 m/s
RV sys press: 47 mmHg
Reg peak vel: 331 cm/s
TAPSE: 12.2 mm
TDI e' lateral: 7.48
TR max vel: 331 cm/s
Weight: 3241.6 oz

## 2017-04-16 LAB — GLUCOSE, CAPILLARY
Glucose-Capillary: 122 mg/dL — ABNORMAL HIGH (ref 65–99)
Glucose-Capillary: 130 mg/dL — ABNORMAL HIGH (ref 65–99)
Glucose-Capillary: 134 mg/dL — ABNORMAL HIGH (ref 65–99)
Glucose-Capillary: 150 mg/dL — ABNORMAL HIGH (ref 65–99)

## 2017-04-16 LAB — BASIC METABOLIC PANEL
Anion gap: 10 (ref 5–15)
BUN: 26 mg/dL — ABNORMAL HIGH (ref 6–20)
CO2: 28 mmol/L (ref 22–32)
Calcium: 8.2 mg/dL — ABNORMAL LOW (ref 8.9–10.3)
Chloride: 92 mmol/L — ABNORMAL LOW (ref 101–111)
Creatinine, Ser: 1.87 mg/dL — ABNORMAL HIGH (ref 0.61–1.24)
GFR calc Af Amer: 40 mL/min — ABNORMAL LOW (ref >60)
GFR calc non Af Amer: 35 mL/min — ABNORMAL LOW (ref >60)
Glucose, Bld: 104 mg/dL — ABNORMAL HIGH (ref 65–99)
Potassium: 4.3 mmol/L (ref 3.5–5.1)
Sodium: 130 mmol/L — ABNORMAL LOW (ref 135–145)

## 2017-04-16 MED ORDER — LOSARTAN POTASSIUM 25 MG PO TABS
12.5000 mg | ORAL_TABLET | Freq: Two times a day (BID) | ORAL | Status: DC
  Administered 2017-04-16 – 2017-04-18 (×5): 12.5 mg via ORAL
  Filled 2017-04-16 (×5): qty 1

## 2017-04-16 NOTE — Progress Notes (Signed)
Patient ID: [name] [name], male   DOB: 08-20-1945, 71 y.o.   MRN: [id number] TCTS DAILY ICU PROGRESS NOTE                   [address]            [place] [address]          [phone]   8 Days Post-Op Procedure(s) (LRB): CORONARY ARTERY BYPASS GRAFTING (CABG) TIMES TWO USING LEFT INTERNAL MAMMARY ARTERY AND LEFT SAPHENOUS LEG VEIN HARVESTED ENDOSCOPICALLY.  LEG VEIN ALSO HARVESTED FROM THE RIGHT LEG (N/A) TRANSESOPHAGEAL ECHOCARDIOGRAM (TEE) (N/A)  Total Length of Stay:  LOS: 16 days   Subjective: Patient awake alert neurologically intact without symptoms of CHF, walked around the unit yesterday several times without difficulty, waiting for stepdown bed  Objective: Vital signs in last 24 hours: Temp:  [97.9 F (36.6 C)-98.4 F (36.9 C)] 98.4 F (36.9 C) (12/18 0300) Pulse Rate:  [75-88] 80 (12/18 0500) Cardiac Rhythm: Normal sinus rhythm (12/18 0400) Resp:  [9-24] 24 (12/18 0700) BP: (84-138)/(55-82) 110/63 (12/18 0700) SpO2:  [92 %-100 %] 95 % (12/18 0500) Weight:  [202 lb 9.6 oz (91.9 kg)] 202 lb 9.6 oz (91.9 kg) (12/18 0600)  Filed Weights   04/14/17 0600 04/15/17 0600 04/16/17 0600  Weight: 211 lb 13.8 oz (96.1 kg) 204 lb 1.6 oz (92.6 kg) 202 lb 9.6 oz (91.9 kg)    Weight change: -8 oz (-0.68 kg)   Hemodynamic parameters for last 24 hours: CVP:  [8 mmHg] 8 mmHg  Intake/Output from previous day: 12/17 0701 - 12/18 0700 In: 750 [P.O.:600; IV Piggyback:150] Out: 850 [Urine:850]  Intake/Output this shift: No intake/output data recorded.  Current Meds: Scheduled Meds: . amiodarone  400 mg Oral BID  . aspirin EC  325 mg Oral Daily  . atorvastatin  80 mg Oral QHS  . Chlorhexidine Gluconate Cloth  6 each Topical Daily  . digoxin  0.125 mg Oral Daily  . enoxaparin (LOVENOX) injection  30 mg Subcutaneous QHS  . furosemide  80 mg Oral Daily  . insulin aspart  0-24 Units Subcutaneous TID WC  . insulin aspart  0-24 Units Subcutaneous TID AC & HS   . insulin detemir  20 Units Subcutaneous Daily  . mouth rinse  15 mL Mouth Rinse BID  . pantoprazole  40 mg Oral Daily  . potassium chloride  40 mEq Oral Daily  . sodium chloride flush  3 mL Intravenous Q12H  . spironolactone  25 mg Oral Daily   Continuous Infusions: . sodium chloride    . piperacillin-tazobactam (ZOSYN)  IV Stopped (04/16/17 0500)   PRN Meds:.sodium chloride, bisacodyl **OR** bisacodyl, ondansetron **OR** ondansetron (ZOFRAN) IV, oxyCODONE, sodium chloride flush, traMADol  General appearance: alert and cooperative Neurologic: intact Heart: regular rate and rhythm, S1, S2 normal, no murmur, click, rub or gallop Lungs: clear to auscultation bilaterally Abdomen: soft, non-tender; bowel sounds normal; no masses,  no organomegaly Extremities: extremities normal, atraumatic, no cyanosis or edema and Homans sign is negative, no sign of DVT Wound: Sternum is stable and well-healed  Lab Results: CBC: Recent Labs    04/15/17 0256 04/16/17 0500  WBC 17.1* 15.6*  HGB 9.3* 9.0*  HCT 27.3* 27.8*  PLT 442* 405*   BMET:  Recent Labs    04/15/17 0256 04/16/17 0500  NA 130* 130*  K 4.0 4.3  CL 89* 92*  CO2 29 28  GLUCOSE 169* 104*  BUN 29* 26*  CREATININE 1.86* 1.87*  CALCIUM 8.0* 8.2*    CMET: Lab Results  Component Value Date   WBC 15.6 (H) 04/16/2017   HGB 9.0 (L) 04/16/2017   HCT 27.8 (L) 04/16/2017   PLT 405 (H) 04/16/2017   GLUCOSE 104 (H) 04/16/2017   CHOL 129 04/01/2017   TRIG 93 04/01/2017   HDL 28 (L) 04/01/2017   LDLCALC 82 04/01/2017   ALT 27 03/31/2017   AST 58 (H) 03/31/2017   NA 130 (L) 04/16/2017   K 4.3 04/16/2017   CL 92 (L) 04/16/2017   CREATININE 1.87 (H) 04/16/2017   BUN 26 (H) 04/16/2017   CO2 28 04/16/2017   INR 1.35 04/08/2017   HGBA1C 6.3 (H) 03/31/2017   Chronic Kidney Disease   Stage I     GFR >90  Stage II    GFR 60-89  Stage IIIA GFR 45-59  Stage IIIB GFR 30-44  Stage IV   GFR 15-29  Stage V    GFR  <15  Lab  Results  Component Value Date   CREATININE 1.87 (H) 04/16/2017   Estimated Creatinine Clearance: 42.1 mL/min (A) (by C-G formula based on SCr of 1.87 mg/dL (H)).   PT/INR: No results for input(s): LABPROT, INR in the last 72 hours. Radiology: No results found.   Assessment/Plan: S/P Procedure(s) (LRB): CORONARY ARTERY BYPASS GRAFTING (CABG) TIMES TWO USING LEFT INTERNAL MAMMARY ARTERY AND LEFT SAPHENOUS LEG VEIN HARVESTED ENDOSCOPICALLY.  LEG VEIN ALSO HARVESTED FROM THE RIGHT LEG (N/A) TRANSESOPHAGEAL ECHOCARDIOGRAM (TEE) (N/A) Mobilize Has been on IV antibiotics since before surgery, d/c today  To tele bed 4e today  Renal function stable at Stage IIIB    [name] [name] 04/16/2017 7:36 AM

## 2017-04-16 NOTE — Progress Notes (Signed)
  Echocardiogram 2D Echocardiogram has been performed.  [name] [name] 04/16/2017, 11:11 AM

## 2017-04-16 NOTE — Progress Notes (Signed)
Patient ID: [name] [name], male   DOB: 1945/07/22, 71 y.o.   MRN: [id number]     Advanced Heart Failure Rounding Note  Subjective:    POD #8 s/p CABG with LIMA to LAD and SVG to OM on 12/10   Co-ox 47% this AM.   Switched to PO Lasix 80 yesterday with poor urine output. CVP not set up. Weight 204->202 lbs.    WBC continues to trend down, 15.1. Last day of Zosyn today. Remains afebrile.    Doing well this AM. Continues to ambulate without difficulty. Denies chest pain and SOB.    Objective:   Weight Range: 202 lb 9.6 oz (91.9 kg) Body mass index is 26.01 kg/m.   Vital Signs:   Temp:  [97.9 F (36.6 C)-98.4 F (36.9 C)] 98.4 F (36.9 C) (12/18 0300) Pulse Rate:  [75-88] 80 (12/18 0500) Resp:  [9-24] 24 (12/18 0700) BP: (84-138)/(55-82) 110/63 (12/18 0700) SpO2:  [92 %-99 %] 95 % (12/18 0500) Weight:  [202 lb 9.6 oz (91.9 kg)] 202 lb 9.6 oz (91.9 kg) (12/18 0600) Last BM Date: 04/14/17 Weight change: Filed Weights   04/14/17 0600 04/15/17 0600 04/16/17 0600  Weight: 211 lb 13.8 oz (96.1 kg) 204 lb 1.6 oz (92.6 kg) 202 lb 9.6 oz (91.9 kg)   Intake/Output:   Intake/Output Summary (Last 24 hours) at 04/16/2017 0801 Last data filed at 04/16/2017 0100 Gross per 24 hour  Intake 750 ml  Output 850 ml  Net -100 ml    Physical Exam   General: appears well, sitting up in chair drinking coffee in no acute distress  HEENT: normal  Cardiac: PMI nondisplaced. Regular rate and rhythm, nl S1/S2, no murmurs, rubs or gallops. JVP ~7 Pulm: CTAB, no wheezes or crackles, no increased WOB. Chest wall incision healing well. No drainage or signs of infection noted.  Abd: soft, NTND, no HSM, normal bowel sounds  Neuro: A&Ox3, no focal deficits noted  Ext: warm and well perfused. Mild LE edema bilaterally. SVG harvest site healing well.    Telemetry   NSR. Personally reviewed.   Labs    CBC Recent Labs    04/14/17 1115 04/15/17 0256 04/16/17 0500  WBC 18.4* 17.1* 15.6*    NEUTROABS 15.1* 13.5*  --   HGB 9.1* 9.3* 9.0*  HCT 26.6* 27.3* 27.8*  MCV 87.2 87.5 88.3  PLT 434* 442* 509*   Basic Metabolic Panel Recent Labs    04/15/17 0256 04/16/17 0500  NA 130* 130*  K 4.0 4.3  CL 89* 92*  CO2 29 28  GLUCOSE 169* 104*  BUN 29* 26*  CREATININE 1.86* 1.87*  CALCIUM 8.0* 8.2*  MG 1.8  --    Liver Function Tests No results for input(s): AST, ALT, ALKPHOS, BILITOT, PROT, ALBUMIN in the last 72 hours. No results for input(s): LIPASE, AMYLASE in the last 72 hours. Cardiac Enzymes No results for input(s): CKTOTAL, CKMB, CKMBINDEX, TROPONINI in the last 72 hours.  BNP: BNP (last 3 results) Recent Labs    03/31/17 2341  BNP 786.9*    ProBNP (last 3 results) No results for input(s): PROBNP in the last 8760 hours.   D-Dimer No results for input(s): DDIMER in the last 72 hours. Hemoglobin A1C No results for input(s): HGBA1C in the last 72 hours. Fasting Lipid Panel No results for input(s): CHOL, HDL, LDLCALC, TRIG, CHOLHDL, LDLDIRECT in the last 72 hours. Thyroid Function Tests No results for input(s): TSH, T4TOTAL, T3FREE, THYROIDAB in the last 72 hours.  Invalid input(s): FREET3  Other results:   Imaging    No results found.   Medications:     Scheduled Medications: . amiodarone  400 mg Oral BID  . aspirin EC  325 mg Oral Daily  . atorvastatin  80 mg Oral QHS  . Chlorhexidine Gluconate Cloth  6 each Topical Daily  . digoxin  0.125 mg Oral Daily  . enoxaparin (LOVENOX) injection  30 mg Subcutaneous QHS  . furosemide  80 mg Oral Daily  . insulin aspart  0-24 Units Subcutaneous TID WC  . insulin aspart  0-24 Units Subcutaneous TID AC & HS  . insulin detemir  20 Units Subcutaneous Daily  . mouth rinse  15 mL Mouth Rinse BID  . pantoprazole  40 mg Oral Daily  . potassium chloride  40 mEq Oral Daily  . sodium chloride flush  3 mL Intravenous Q12H  . spironolactone  25 mg Oral Daily    Infusions: . sodium chloride    .  piperacillin-tazobactam (ZOSYN)  IV Stopped (04/16/17 0500)    PRN Medications: sodium chloride, bisacodyl **OR** bisacodyl, ondansetron **OR** ondansetron (ZOFRAN) IV, oxyCODONE, sodium chloride flush, traMADol   Patient Profile   71 y/o with anterior STEMI who developed cardiogenic shock. 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 Impella placed for stabilization of shock.   S/p CABG with LIMA to LAD and SVG to OM on 04/08/17  Assessment/Plan   1. Cardiogenic shock/acute systolic CHF: Ischemic cardiomyopathy, EF with EF 15-20%. Overall stable and doing well. Co-ox 47% this AM. Off inotropes since 12/14. BP remains stable. He was transitioned from Lasix gtt to PO Lasix 80 daily yesterday. Weight is down 2 pounds. Renal function remains stable with Cr at 1.8 - Continue PO Lasix 80 mg QD  - Continue digoxin 0.125.  - Continue spironolactone to 25 mg daily.  - No ARB/ARNI yet with elevated creatinine.   - Plan to transfer to floor today  - HHPT on discharge. Not a candidate for CIR.   2. CAD: s/p CABG 12/10. Stable post-op. No ischemic symptoms  - Continue ASA and statin.   3.  ID -> Sepsis unclear source. UCx with pseudomonas. BCx NGTD. Sputum culture negative. CCM treating for AECOPD. WBC trending down. Afebrile  -  Continue Zosyn, will stop today     4. Hyponatremia: stable. Na 130 this AM.  - Free H2O restrict.   5. Tobacco abuse: Smoked 1 ppd PTA for > 50 years.  - Understands need for smoking cessation   6. Hx of bladder CA: Pt states he had multiple rounds of Bacillus Calmette-Guerin (BCG) therapy. No change.   7. Scrotal swelling: [place] 04/04/17 with R hydrocele and ? Epididymitis. - On broad spectrum ABX already.   8. AKI: Creatinine stable.  Watch closely with diuresis.   9. Atrial fibrillation: Paroxysmal, currently NSR on amiodarone.   - Continue po amiodarone. Will aim to stop amiodarone as soon as possible  post-op in the setting of underlying lung disease.  - When pacing wires are out, plan for Eliquis.   Length of Stay: [place], MD  04/16/2017, 8:01 AM  Advanced Heart Failure Team Pager [phone] (M-F; [address])  Please contact [facility] for night-coverage after hours (4p -7a ) and weekends on [email or web address]  Welford Roche, MD  8:01 AM 04/16/2017  Patient seen and examined with the above-signed Advanced Practice Provider and/or Housestaff. I personally  reviewed laboratory data, imaging studies and relevant notes. I independently examined the patient and formulated the important aspects of the plan. I have edited the note to reflect any of my changes or salient points. I have personally discussed the plan with the patient and/or family.  Co-ox low but remains asx. Will not restart inotropes at this time. Repeat co-ox this afternoon. Start losartan 12.5 bid for afterload reduction. Await echo.Volume status looks good on po diuretics. WBC coming down. Will stop abx today. Is now ambulating unit. Can go to SDU. Rhythms stable. Continue po amio.   Glori Bickers, MD  9:17 AM

## 2017-04-16 NOTE — Progress Notes (Signed)
Physical Therapy Treatment Patient Details Name: [name] [name] MRN: [id number] DOB: [birth year] Today's Date: 04/16/2017    History of Present Illness Pt is a 71 y/o male with PMH significant for HTN, DM, Bladder CA, but no cardiac hx, who presented to ED on 03/31/17 with acute c/o dyspnea, thought to be due to flu symptoms, but found to have STEMI by ECG, ARF with hypoxia on 6L and acute systolic HF.   S/P Cath.  S/P CABGx2 on 12/11.   PT Comments    Pt progressing well with mobility. Continues to require intermittent cues for maintaining sternal precautions. Able to amb around room and use bathroom with no AD and supervision for safety. Pt declining ambulation in hallway; educ on stair training. From a mobility perspective, feel pt is safe to return home with supervision for wife, intermittent use of RW, and HHPT. Will continue to follow acutely.   Follow Up Recommendations  Home health PT;Supervision/Assistance - 24 hour     Equipment Recommendations  Rolling walker with 5" wheels;3in1 (PT)    Recommendations for Other Services       Precautions / Restrictions Precautions Precautions: Fall;Sternal Restrictions Other Position/Activity Restrictions: Sternal    Mobility  Bed Mobility Overal bed mobility: Needs Assistance Bed Mobility: Sit to Supine       Sit to supine: Supervision   General bed mobility comments: Supervision to ensure pt maintaining sternal precautions when returning to supine. Able to do that and scoot up in bed with no physical assist  Transfers Overall transfer level: Needs assistance Equipment used: None Transfers: Sit to/from Stand Sit to Stand: Supervision         General transfer comment: Cues for sternal precautions, as pt pushing for chair arm rests to stand. Able to stand from chair and toilet with supervision for safety  Ambulation/Gait Ambulation/Gait assistance: Supervision Ambulation Distance (Feet): 20 Feet Assistive device:  None Gait Pattern/deviations: Step-through pattern;Trunk flexed;Narrow base of support Gait velocity: Decreased   General Gait Details: Pt declining amb in hallway secondary to having already walked this morning. Able to amb to/from bathroom and return to bed with no AD and supervision for safety. SpO2 >90% on RA   Stairs            Wheelchair Mobility    Modified Rankin (Stroke Patients Only)       Balance Overall balance assessment: Needs assistance Sitting-balance support: No upper extremity supported Sitting balance-Leahy Scale: Good       Standing balance-Leahy Scale: Fair Standing balance comment: Able to amb with no AD and supervision for safety                            Cognition Arousal/Alertness: Awake/alert Behavior During Therapy: WFL for tasks assessed/performed Overall Cognitive Status: Within Functional Limits for tasks assessed                                        Exercises      General Comments General comments (skin integrity, edema, etc.): Wife present during session      Pertinent Vitals/Pain Pain Assessment: Faces Faces Pain Scale: Hurts a little bit Pain Location: Sternal Pain Descriptors / Indicators: Sore Pain Intervention(s): Monitored during session    Home Living  Prior Function            PT Goals (current goals can now be found in the care plan section) Acute Rehab PT Goals Patient Stated Goal: Return home tomorrow PT Goal Formulation: With patient Time For Goal Achievement: 04/25/17 Potential to Achieve Goals: Good Progress towards PT goals: Progressing toward goals    Frequency    Min 3X/week      PT Plan Current plan remains appropriate    Co-evaluation              AM-PAC PT "6 Clicks" Daily Activity  Outcome Measure  Difficulty turning over in bed (including adjusting bedclothes, sheets and blankets)?: None Difficulty moving from lying  on back to sitting on the side of the bed? : None Difficulty sitting down on and standing up from a chair with arms (e.g., wheelchair, bedside commode, etc,.)?: A Little Help needed moving to and from a bed to chair (including a wheelchair)?: A Little Help needed walking in hospital room?: A Little Help needed climbing 3-5 steps with a railing? : A Little 6 Click Score: 20    End of Session Equipment Utilized During Treatment: Gait belt Activity Tolerance: Patient tolerated treatment well Patient left: in bed;with call bell/phone within reach;with family/visitor present Nurse Communication: Mobility status PT Visit Diagnosis: Unsteadiness on feet (R26.81);Difficulty in walking, not elsewhere classified (R26.2);Pain Pain - part of body: (Sternum)     Time: 0092-3300 PT Time Calculation (min) (ACUTE ONLY): 19 min  Charges:  $Therapeutic Activity: 8-22 mins                    G Codes:      Mabeline Caras, PT, DPT Acute Rehab Services  Pager: [place] 04/16/2017, 9:15 AM

## 2017-04-17 LAB — BASIC METABOLIC PANEL
Anion gap: 9 (ref 5–15)
BUN: 24 mg/dL — AB (ref 6–20)
CHLORIDE: 94 mmol/L — AB (ref 101–111)
CO2: 26 mmol/L (ref 22–32)
CREATININE: 1.67 mg/dL — AB (ref 0.61–1.24)
Calcium: 7.9 mg/dL — ABNORMAL LOW (ref 8.9–10.3)
GFR calc Af Amer: 46 mL/min — ABNORMAL LOW (ref >60)
GFR calc non Af Amer: 40 mL/min — ABNORMAL LOW (ref >60)
GLUCOSE: 98 mg/dL (ref 65–99)
Potassium: 4.3 mmol/L (ref 3.5–5.1)
Sodium: 129 mmol/L — ABNORMAL LOW (ref 135–145)

## 2017-04-17 LAB — CBC
HCT: 28.9 % — ABNORMAL LOW (ref 39.0–52.0)
Hemoglobin: 9.3 g/dL — ABNORMAL LOW (ref 13.0–17.0)
MCH: 28.5 pg (ref 26.0–34.0)
MCHC: 32.2 g/dL (ref 30.0–36.0)
MCV: 88.7 fL (ref 78.0–100.0)
Platelets: 409 10*3/uL — ABNORMAL HIGH (ref 150–400)
RBC: 3.26 MIL/uL — ABNORMAL LOW (ref 4.22–5.81)
RDW: 15.6 % — ABNORMAL HIGH (ref 11.5–15.5)
WBC: 13 10*3/uL — ABNORMAL HIGH (ref 4.0–10.5)

## 2017-04-17 LAB — GLUCOSE, CAPILLARY
Glucose-Capillary: 100 mg/dL — ABNORMAL HIGH (ref 65–99)
Glucose-Capillary: 128 mg/dL — ABNORMAL HIGH (ref 65–99)
Glucose-Capillary: 123 mg/dL — ABNORMAL HIGH (ref 65–99)
Glucose-Capillary: 136 mg/dL — ABNORMAL HIGH (ref 65–99)

## 2017-04-17 LAB — MAGNESIUM: Magnesium: 2 mg/dL (ref 1.7–2.4)

## 2017-04-17 NOTE — Progress Notes (Signed)
Pt in bed, wires just pulled. Has been walking on 2H. Will need RW for open spaces from our discussion. Ed completed with pt and wife. Voiced understanding but pt easily frustrated at times. Did not want to discuss smoking cessation for long. Declined fake cigarette. Reviewed HF, low sodium, ex, and CRPII. Will refer to [facility] CES, ACSM 10:52 AM /04/17/2017

## 2017-04-17 NOTE — Progress Notes (Addendum)
      [address]       [place] [address]             [phone]      9 Days Post-Op Procedure(s) (LRB): CORONARY ARTERY BYPASS GRAFTING (CABG) TIMES TWO USING LEFT INTERNAL MAMMARY ARTERY AND LEFT SAPHENOUS LEG VEIN HARVESTED ENDOSCOPICALLY.  LEG VEIN ALSO HARVESTED FROM THE RIGHT LEG (N/A) TRANSESOPHAGEAL ECHOCARDIOGRAM (TEE) (N/A)   Subjective:  No new complaints.  Feels great, really wants to go home today.  Objective: Vital signs in last 24 hours: Temp:  [97.6 F (36.4 C)-98.6 F (37 C)] 98.5 F (36.9 C) (12/19 0333) Pulse Rate:  [71-81] 71 (12/19 0333) Cardiac Rhythm: Heart block (12/19 0704) Resp:  [15-20] 15 (12/19 0333) BP: (103-130)/(54-79) 108/58 (12/19 0333) SpO2:  [94 %-100 %] 100 % (12/19 0333)  Intake/Output from previous day: 12/18 0701 - 12/19 0700 In: 60 [P.O.:60] Out: 850 [Urine:850]  General appearance: alert, cooperative and no distress Heart: regular rate and rhythm Lungs: clear to auscultation bilaterally Abdomen: soft, non-tender; bowel sounds normal; no masses,  no organomegaly Extremities: edema trace Wound: clean and dry  Lab Results: Recent Labs    04/16/17 0500 04/17/17 0214  WBC 15.6* 13.0*  HGB 9.0* 9.3*  HCT 27.8* 28.9*  PLT 405* 409*   BMET:  Recent Labs    04/16/17 0500 04/17/17 0214  NA 130* 129*  K 4.3 4.3  CL 92* 94*  CO2 28 26  GLUCOSE 104* 98  BUN 26* 24*  CREATININE 1.87* 1.67*  CALCIUM 8.2* 7.9*    PT/INR: No results for input(s): LABPROT, INR in the last 72 hours. ABG    Component Value Date/Time   PHART 7.398 04/08/2017 1843   HCO3 23.2 04/08/2017 1843   TCO2 22 04/09/2017 1730   ACIDBASEDEF 2.0 04/08/2017 1843   O2SAT 84.2 04/16/2017 1230   CBG (last 3)  Recent Labs    04/16/17 1620 04/16/17 2125 04/17/17 0619  GLUCAP 134* 150* 100*    Assessment/Plan: S/P Procedure(s) (LRB): CORONARY ARTERY BYPASS GRAFTING (CABG) TIMES TWO USING LEFT INTERNAL MAMMARY ARTERY AND LEFT  SAPHENOUS LEG VEIN HARVESTED ENDOSCOPICALLY.  LEG VEIN ALSO HARVESTED FROM THE RIGHT LEG (N/A) TRANSESOPHAGEAL ECHOCARDIOGRAM (TEE) (N/A)  1. CV- NSR- continue Amiodarone, digoxin, cozaar... D/c EPW today 2. Pulm- no acute issues, continue IS 3. Renal- creatinine improved, down to 1.67, continue diuretics per AHF 4. DM- sugars controlled, resume home meds at discharge 5. Dispo- patient looks great, d/c EPW today, will plan to d/c home this afternoon if okay with staff   LOS: 17 days    Ellwood Handler 04/17/2017  Check cr in am , if stable home in am Wires out today  I have seen and examined [name] and agree with the above assessment  and plan.  [name] MD [facility] [phone] Office [phone] 04/17/2017 5:48 PM

## 2017-04-17 NOTE — Progress Notes (Signed)
Patient ID: [name] [name], male   DOB: 08-Nov-1945, 71 y.o.   MRN: [id number]     Advanced Heart Failure Rounding Note  Subjective:    POD #9 s/p CABG with LIMA to LAD and SVG to OM on 12/10   Repeat co-ox yesterday 84%.   On PO Lasix 80. No weight documented today. Cr trending down, 1.8--> 1.6.  Afebrile. WBC 13.  Continues to do well. Complains of SOB at night. Denies chest pain.    Objective:   Weight Range: 202 lb 9.6 oz (91.9 kg) Body mass index is 26.01 kg/m.   Vital Signs:   Temp:  [97.6 F (36.4 C)-98.6 F (37 C)] 98.5 F (36.9 C) (12/19 0333) Pulse Rate:  [71-86] 71 (12/19 0333) Resp:  [13-20] 15 (12/19 0333) BP: (103-130)/(45-79) 108/58 (12/19 0333) SpO2:  [94 %-100 %] 100 % (12/19 0333) Last BM Date: 04/16/17(x2) Weight change: Filed Weights   04/14/17 0600 04/15/17 0600 04/16/17 0600  Weight: 211 lb 13.8 oz (96.1 kg) 204 lb 1.6 oz (92.6 kg) 202 lb 9.6 oz (91.9 kg)   Intake/Output:   Intake/Output Summary (Last 24 hours) at 04/17/2017 0809 Last data filed at 04/16/2017 2328 Gross per 24 hour  Intake 60 ml  Output 850 ml  Net -790 ml    Physical Exam    General: appears well, in no acute distress  HENT: normal  Cor: PMI nondisplaced. Regular rate and rhythm, nl S1/S2, no murmurs, rubs or gallops. JVP ~6 Pulm: CTAB, no wheezes or crackles, no increased work of breathing. Chest wall incision healing well.  Abd: soft, NTND, normal bowel sounds Neuro: A&Ox3, no focal deficits noted  Ext: warm and well perfused. Mild LE edema bilaterally improving. SVG harvest site healing well.  Affect pleasant    Telemetry   NSR. Personally reviewed.   Labs    CBC Recent Labs    04/14/17 1115 04/15/17 0256 04/16/17 0500 04/17/17 0214  WBC 18.4* 17.1* 15.6* 13.0*  NEUTROABS 15.1* 13.5*  --   --   HGB 9.1* 9.3* 9.0* 9.3*  HCT 26.6* 27.3* 27.8* 28.9*  MCV 87.2 87.5 88.3 88.7  PLT 434* 442* 405* 497*   Basic Metabolic Panel Recent Labs     04/15/17 0256 04/16/17 0500 04/17/17 0214  NA 130* 130* 129*  K 4.0 4.3 4.3  CL 89* 92* 94*  CO2 29 28 26   GLUCOSE 169* 104* 98  BUN 29* 26* 24*  CREATININE 1.86* 1.87* 1.67*  CALCIUM 8.0* 8.2* 7.9*  MG 1.8  --  2.0   Liver Function Tests No results for input(s): AST, ALT, ALKPHOS, BILITOT, PROT, ALBUMIN in the last 72 hours. No results for input(s): LIPASE, AMYLASE in the last 72 hours. Cardiac Enzymes No results for input(s): CKTOTAL, CKMB, CKMBINDEX, TROPONINI in the last 72 hours.  BNP: BNP (last 3 results) Recent Labs    03/31/17 2341  BNP 786.9*    ProBNP (last 3 results) No results for input(s): PROBNP in the last 8760 hours.   D-Dimer No results for input(s): DDIMER in the last 72 hours. Hemoglobin A1C No results for input(s): HGBA1C in the last 72 hours. Fasting Lipid Panel No results for input(s): CHOL, HDL, LDLCALC, TRIG, CHOLHDL, LDLDIRECT in the last 72 hours. Thyroid Function Tests No results for input(s): TSH, T4TOTAL, T3FREE, THYROIDAB in the last 72 hours.  Invalid input(s): FREET3  Other results:   Imaging    No results found.   Medications:     Scheduled Medications: .  amiodarone  400 mg Oral BID  . aspirin EC  325 mg Oral Daily  . atorvastatin  80 mg Oral QHS  . Chlorhexidine Gluconate Cloth  6 each Topical Daily  . digoxin  0.125 mg Oral Daily  . enoxaparin (LOVENOX) injection  30 mg Subcutaneous QHS  . furosemide  80 mg Oral Daily  . insulin aspart  0-24 Units Subcutaneous TID WC  . insulin aspart  0-24 Units Subcutaneous TID AC & HS  . insulin detemir  20 Units Subcutaneous Daily  . losartan  12.5 mg Oral BID  . mouth rinse  15 mL Mouth Rinse BID  . pantoprazole  40 mg Oral Daily  . potassium chloride  40 mEq Oral Daily  . sodium chloride flush  3 mL Intravenous Q12H  . spironolactone  25 mg Oral Daily    Infusions: . sodium chloride      PRN Medications: sodium chloride, bisacodyl **OR** bisacodyl, ondansetron  **OR** ondansetron (ZOFRAN) IV, oxyCODONE, sodium chloride flush, traMADol   Patient Profile   71 y/o with anterior STEMI who developed cardiogenic shock. 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 Impella placed for stabilization of shock.   S/p CABG with LIMA to LAD and SVG to OM on 04/08/17  Assessment/Plan   1. Cardiogenic shock/acute systolic CHF: Ischemic cardiomyopathy, EF with EF 15-20%. POD #9 s/p CABG 12/10. Overall stable and doing well. Repeat co-ox yesterday 84%. Off inotropes since 12/14. BP stable. On PO Lasix 80.  Renal function stable and mildly improved. Cr 1.6.  - Continue PO Lasix 80 mg QD  - Continue digoxin 0.125.  - Continue spironolactone to 25 mg daily.  - Continue losartan 12.5 bid - Possible discharge today   - HHPT on discharge. Not a candidate for CIR.   2. CAD: s/p CABG 12/10. Stable post-op. No ischemic symptoms  - Continue ASA and statin.   3.  ID -> Sepsis unclear source. UCx with pseudomonas. BCx NGTD. Sputum culture negative. CCM treating for AECOPD. Remains afebrile and WBC 13. Completed antibiotic therapy.   4. Hyponatremia: stable. Na 129this AM.  - Free H2O restrict.   5. Tobacco abuse: Smoked 1 ppd PTA for > 50 years.  - Understands need for smoking cessation   6. Hx of bladder CA: Pt states he had multiple rounds of Bacillus Calmette-Guerin (BCG) therapy. No change.   7. Scrotal swelling: [place] 04/04/17 with R hydrocele and ? Epididymitis. Improved.   8. AKI: Creatinine stable.   9. Atrial fibrillation: Paroxysmal, currently NSR on PO amiodarone.   - Continue po amiodarone. Will aim to stop amiodarone as soon as possible post-op in the setting of underlying lung disease.    Length of Stay: [place], [place]  04/17/2017, 8:09 AM  Advanced Heart Failure Team Pager [phone] (M-F; [place])  Please contact [facility] for night-coverage after hours (4p -7a ) and  weekends on [email or web address]  Welford Roche, MD  8:09 AM 04/17/2017  Patient seen and examined with the above-signed Advanced Practice Provider and/or Housestaff. I personally reviewed laboratory data, imaging studies and relevant notes. I independently examined the patient and formulated the important aspects of the plan. I have edited the note to reflect any of my changes or salient points. I have personally discussed the plan with the patient and/or family.  Doing well today. Stable for d/c. No CP or SOB. Ambulating. Renal function improved.   Ok  for d/c today on:   Lasix 80 daily Losartan 25 daily Digoxin 0.125 daily  Spiro 25 daily Amiodarone 200 bid Atorvastatin 80  Kcl  20 daily  ASA 81 mg daily.   No b-blocker yet.   Given no AF for over 1 week would not use Eliquis at d/c.  Will follow closely in [facility] Clinic. Refer to CR.   Glori Bickers, MD  2:58 PM

## 2017-04-17 NOTE — Discharge Instructions (Signed)
Coronary Artery Bypass Grafting, Care After This sheet gives you information about how to care for yourself after your procedure. Your health care provider may also give you more specific instructions. If you have problems or questions, contact your health care provider. What can I expect after the procedure? After the procedure, it is common to have:  Nausea and a lack of appetite.  Constipation.  Weakness and fatigue.  Depression or irritability.  Pain or discomfort in your incision areas.  Follow these instructions at home: Medicines  Take over-the-counter and prescription medicines only as told by your health care provider. Do not stop taking medicines or start any new medicines without approval from your health care provider.  If you were prescribed an antibiotic medicine, take it as told by your health care provider. Do not stop taking the antibiotic even if you start to feel better.  Do not drive or use heavy machinery while taking prescription pain medicine. Incision care  Follow instructions from your health care provider about how to take care of your incisions. Make sure you: ? Wash your hands with soap and water before you change your bandage (dressing). If soap and water are not available, use hand sanitizer. ? Change your dressing as told by your health care provider. ? Leave stitches (sutures), skin glue, or adhesive strips in place. These skin closures may need to stay in place for 2 weeks or longer. If adhesive strip edges start to loosen and curl up, you may trim the loose edges. Do not remove adhesive strips completely unless your health care provider tells you to do that.  Keep incision areas clean, dry, and protected.  Check your incision areas every day for signs of infection. Check for: ? More redness, swelling, or pain. ? More fluid or blood. ? Warmth. ? Pus or a bad smell.  If incisions were made in your legs: ? Avoid crossing your legs. ? Avoid  sitting for long periods of time. Change positions every 30 minutes. ? Raise (elevate) your legs when you are sitting. Bathing  Do not take baths, swim, or use a hot tub until your health care provider approves.  Only take sponge baths. Pat the incisions dry. Do not rub incisions with a washcloth or towel.  Ask your health care provider when you can shower. Eating and drinking  Eat foods that are high in fiber, such as raw fruits and vegetables, whole grains, beans, and nuts. Meats should be lean cut. Avoid canned, processed, and fried foods. This can help prevent constipation and is a recommended part of a heart-healthy diet.  Drink enough fluid to keep your urine clear or pale yellow.  Limit alcohol intake to no more than 1 drink a day for nonpregnant women and 2 drinks a day for men. One drink equals 12 oz of beer, 5 oz of wine, or 1 oz of hard liquor. Activity  Rest and limit your activity as told by your health care provider. You may be instructed to: ? Stop any activity right away if you have chest pain, shortness of breath, irregular heartbeats, or dizziness. Get help right away if you have any of these symptoms. ? Move around frequently for short periods or take short walks as directed by your health care provider. Gradually increase your activities. You may need physical therapy or cardiac rehabilitation to help strengthen your muscles and build your endurance. ? Avoid lifting, pushing, or pulling anything that is heavier than 10 lb (4.5 kg) for at  least 6 weeks or as told by your health care provider. °· Do not drive until your health care provider approves. °· Ask your health care provider when you may return to work. °· Ask your health care provider when you may resume sexual activity. °General instructions °· Do not use any products that contain nicotine or tobacco, such as cigarettes and e-cigarettes. If you need help quitting, ask your health care provider. °· Take 2-3 deep  breaths every few hours during the day, while you recover. This helps expand your lungs and prevent complications like pneumonia after surgery. °· If you were given a device called an incentive spirometer, use it several times a day to practice deep breathing. Support your chest with a pillow or your arms when you take deep breaths or cough. °· Wear compression stockings as told by your health care provider. These stockings help to prevent blood clots and reduce swelling in your legs. °· Weigh yourself every day. This helps identify if your body is holding (retaining) fluid that may make your heart and lungs work harder. °· Keep all follow-up visits as told by your health care provider. This is important. °Contact a health care provider if: °· You have more redness, swelling, or pain around any incision. °· You have more fluid or blood coming from any incision. °· Any incision feels warm to the touch. °· You have pus or a bad smell coming from any incision °· You have a fever. °· You have swelling in your ankles or legs. °· You have pain in your legs. °· You gain 2 lb (0.9 kg) or more a day. °· You are nauseous or you vomit. °· You have diarrhea. °Get help right away if: °· You have chest pain that spreads to your jaw or arms. °· You are short of breath. °· You have a fast or irregular heartbeat. °· You notice a "clicking" in your breastbone (sternum) when you move. °· You have numbness or weakness in your arms or legs. °· You feel dizzy or light-headed. °Summary °· After the procedure, it is common to have pain or discomfort in the incision areas. °· Do not take baths, swim, or use a hot tub until your health care provider approves. °· Gradually increase your activities. You may need physical therapy or cardiac rehabilitation to help strengthen your muscles and build your endurance. °· Weigh yourself every day. This helps identify if your body is holding (retaining) fluid that may make your heart and lungs work  harder. °This information is not intended to replace advice given to you by your health care provider. Make sure you discuss any questions you have with your health care provider. °Document Released: 11/03/2004 Document Revised: 03/05/2016 Document Reviewed: 03/05/2016 °Elsevier Interactive Patient Education © 2018 [employer] ° ° °Endoscopic Saphenous Vein Harvesting, Care After °Refer to this sheet in the next few weeks. These instructions provide you with information about caring for yourself after your procedure. Your health care provider may also give you more specific instructions. Your treatment has been planned according to current medical practices, but problems sometimes occur. Call your health care provider if you have any problems or questions after your procedure. °What can I expect after the procedure? °After the procedure, it is common to have: °· Pain. °· Bruising. °· Swelling. °· Numbness. ° °Follow these instructions at home: °Medicine °· Take over-the-counter and prescription medicines only as told by your health care provider. °· Do not drive or operate heavy machinery   while taking prescription pain medicine. °Incision care ° °· Follow instructions from your health care provider about how to take care of the cut made during surgery (incision). Make sure you: °? Wash your hands with soap and water before you change your bandage (dressing). If soap and water are not available, use hand sanitizer. °? Change your dressing as told by your health care provider. °? Leave stitches (sutures), skin glue, or adhesive strips in place. These skin closures may need to be in place for 2 weeks or longer. If adhesive strip edges start to loosen and curl up, you may trim the loose edges. Do not remove adhesive strips completely unless your health care provider tells you to do that. °· Check your incision area every day for signs of infection. Check for: °? More redness, swelling, or pain. °? More fluid or  blood. °? Warmth. °? Pus or a bad smell. °General instructions °· Raise (elevate) your legs above the level of your heart while you are sitting or lying down. °· Do any exercises your health care providers have given you. These may include deep breathing, coughing, and walking exercises. °· Do not shower, take baths, swim, or use a hot tub unless told by your health care provider. °· Wear your elastic stocking if told by your health care provider. °· Keep all follow-up visits as told by your health care provider. This is important. °Contact a health care provider if: °· Medicine does not help your pain. °· Your pain gets worse. °· You have new leg bruises or your leg bruises get bigger. °· You have a fever. °· Your leg feels numb. °· You have more redness, swelling, or pain around your incision. °· You have more fluid or blood coming from your incision. °· Your incision feels warm to the touch. °· You have pus or a bad smell coming from your incision. °Get help right away if: °· Your pain is severe. °· You develop pain, tenderness, warmth, redness, or swelling in any part of your leg. °· You have chest pain. °· You have trouble breathing. °This information is not intended to replace advice given to you by your health care provider. Make sure you discuss any questions you have with your health care provider. °Document Released: 12/27/2010 Document Revised: 09/22/2015 Document Reviewed: 02/28/2015 °Elsevier Interactive Patient Education © 2018 [employer] ° ° °

## 2017-04-17 NOTE — Discharge Summary (Signed)
Physician Discharge Summary  Patient ID: DETRAVION TESTER MRN: [id number] DOB/AGE: 1946-04-28 71 y.o.  Admit date: 03/31/2017 Discharge date: 04/18/2017  Admission Diagnoses:  Patient Active Problem List   Diagnosis Date Noted  . ST elevation myocardial infarction (STEMI) of anterior wall ([place]) 03/31/2017  . Acute respiratory failure with hypoxemia ([place]) 03/31/2017  . STEMI (ST elevation myocardial infarction) ([place]) 03/31/2017  . Acute ST elevation myocardial infarction (STEMI) of anterior wall ([place]) 03/31/2017  . Essential hypertension   . Diabetes mellitus type 2, controlled ([place])   . Acute combined systolic and diastolic heart failure [facility])    Discharge Diagnoses:   Patient Active Problem List   Diagnosis Date Noted  . 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 ST elevation myocardial infarction (STEMI) of anterior wall ([place]) 03/31/2017  . Acute respiratory failure with hypoxemia ([place]) 03/31/2017  . STEMI (ST elevation myocardial infarction) ([place]) 03/31/2017  . Acute ST elevation myocardial infarction (STEMI) of anterior wall ([place]) 03/31/2017  . Essential hypertension   . Diabetes mellitus type 2, controlled ([place])   . Acute combined systolic and diastolic heart failure (H[facility])    Discharged Condition: good  History of Present Illness:  [name] is a 71 yo white male with history of heavy nicotine abuse, DM and HTN.  He also notes a strong family history of CAD.  The patient was in his usual state of health until 11/29 when he started feeling poorly with fever and chills, and thought he had the flu.  He also developed intermittent chest pain around the same time which persisted over the weekend.  This prompted the patient to ultimately present for evaluation on 12/3 in the ED at which time he was ruled in for STEMI with changes in leads V1 and V2 and marked changes in V3.  He was also noted to have Q waves in the anteroseptal leads.  Troponin levels were positive and he was taken emergently for  cardiac catheterization.  This showed severe 2 vessel CAD.  He had a reduced EF of 25% and he required placement on an Impella for additional hemodynamic support.  It was felt coronary bypass grafting would be indicated and he was admitted to the ICU for further care.     Hospital Course:   During admission he was febrile temp as high as 104 with associated leukocytosis.  He was suspected to be septic, but the only identifiable cause with positive urine culture with pseudomonas.  Critical care consult was obtained and they felt a PNA was likley.  He was treated with broad spectrum antibiotics for this.  He was evaluated by Dr. Cyndia Bent who was in agreement coronary bypass grafting, however this could not be performed until patient had recovered from his underlying infection.  The risks and benefits of the procedure were explained to the patient and he was agreeable to proceed.  He was aggressively managed by AHF team.  He was treated with broad spectrum antibiotics by CCM.  His fever and leukocytosis resolved.  He was felt medically stable to proceed with coronary bypass grafting on 04/08/2017.  He was taken to the operating room and underwent CABG x 2 utilizing LIMA to LAD and SVG to OM.  He also underwent endoscopic harvest from his right and left legs.  He tolerated the procedure without difficulty and was taken to the SICU in stable condition.  He was extubated the evening of surgery.  During his stay in the SICU the patient was weaned off Levophed,  Neo-synephrine, Epinephrine, and Milrinone as tolerated.  He was followed closely by the AHF for weaning of these drips.  He was transfused several units of packed cells post operatively for expected blood loss anemia.  He developed rapid atrial fibrillation and was treated with Amiodarone with successful conversion to NSR.  His mediastinal chest tubes were removed on POD #1.  He again developed a leukocytosis without a clear source.  It was expected to be a  stress response.  He remained on broad spectrum antibiotics post operatively.  He was aggressively diuresed for hypervolemia and responded well to Lasix.  His pleural chest tubes were removed on POD #3.  Follow up CXR was free from pneumothorax.  He developed an elevation in his creatinine.  His vancomycin was discontinued and this was monitored closely.  He continued to have recurrent episodes of atrial fibrillation responsive to IV amiodarone boluses.  He continued to make good progress.  He was ambulating independently and was felt stable for transfer to the stepdown unit on 04/16/2017.  He continues to make progress.  He is maintaining NSR.  His creatinine has stabilized with most recent level coming down to 1.67.  He has been weaned off all drips.  His pacing wires have been removed.  He continues to ambulate independently.  He is medically stable for discharge home today.                  Consults: cardiology and pulmonary/intensive care  Significant Diagnostic Studies: angiography:    Prox RCA lesion is 100% stenosed. Dist RCA lesion is 100% stenosed (retrograde filling from collaterals does not go beyond the distal vessel) -->faint collateral filling to the distal PL and PDA from septal perforators and a diffusely diseased AV groove circumflex  LAV Groove lesion is 90% stenosed.  Prox LAD-1 lesion is 95% stenosed. Prox LAD-2 lesion is 85% stenosed. Heavily calcified, tandem lesions in very tortuous segment of the vessel.  Prox Cx to Mid Cx lesion is 70% stenosed. Heavily calcified  There is severe left ventricular systolic dysfunction. The left ventricular ejection fraction is less than 25% by visual estimate.  LV end diastolic pressure is severely elevated -prior to Impella insertion  Hemodynamic findings consistent with moderate pulmonary hypertension.  Successful Impella insertion with 3.5-3.6 LPM flow's   The patient has severe native coronary disease with an occluded RCA and  extensive calcified disease in the LAD including 2 severe lesions that are not favorable for PCI based on the tortuosity, heavily calcified segment before and after the lesions.  Likely not to get a good outcome without atherectomy which is dangerous given the tortuosity. There is also diffuse calcified disease in the circumflex that appears to be at least 70%.  Treatments: surgery:   1. Median Sternotomy 2. Extracorporeal circulation 3.   Coronary artery bypass grafting x 2   Left internal mammary graft to the LAD  SVG to OM   4.   Endoscopic vein harvest from both legs  Disposition: 01-Home or Self Care   Discharge Medications:  The patient has been discharged on:   1.Beta Blocker:  Yes [   ]                              No   [ x  ]  If No, reason: low EF, labile BP  2.Ace Inhibitor/ARB: Yes [ x  ]                                     No  [    ]                                     If No, reason:  3.Statin:   Yes [x   ]                  No  [   ]                  If No, reason:  4.Ecasa:  Yes  [ x  ]                  No   [   ]                  If No, reason:     Discharge Instructions    Amb Referral to Cardiac Rehabilitation   Complete by:  As directed    Diagnosis:   CABG STEMI     CABG X ___:  2   Discharge patient   Complete by:  As directed    Discharge disposition:  01-Home or Self Care   Discharge patient date:  04/18/2017     Allergies as of 04/18/2017   No Known Allergies     Medication List    STOP taking these medications   amLODipine 10 MG tablet Commonly known as:  NORVASC   aspirin 81 MG tablet Replaced by:  aspirin 325 MG EC tablet   lisinopril 20 MG tablet Commonly known as:  PRINIVIL,ZESTRIL   THERAFLU COLD & COUGH PO     TAKE these medications   amiodarone 200 MG tablet Commonly known as:  PACERONE Take 1 tablet (200 mg total) by mouth 2 (two) times daily.   aspirin 325 MG EC tablet Take  1 tablet (325 mg total) by mouth daily. Replaces:  aspirin 81 MG tablet   atorvastatin 80 MG tablet Commonly known as:  LIPITOR Take 1 tablet (80 mg total) by mouth at bedtime. What changed:    medication strength  how much to take  when to take this   digoxin 0.125 MG tablet Commonly known as:  LANOXIN Take 1 tablet (0.125 mg total) by mouth daily.   furosemide 80 MG tablet Commonly known as:  LASIX Take 1 tablet (80 mg total) by mouth daily.   losartan 25 MG tablet Commonly known as:  COZAAR Take 0.5 tablets (12.5 mg total) by mouth 2 (two) times daily.   mometasone 50 MCG/ACT nasal spray Commonly known as:  NASONEX Place 1-2 sprays into the nose daily as needed for allergies.   oxyCODONE 5 MG immediate release tablet Commonly known as:  Oxy IR/ROXICODONE Take 1 tablet (5 mg total) by mouth every 6 (six) hours as needed for severe pain.   pioglitazone-metformin 15-500 MG tablet Commonly known as:  ACTOPLUS MET Take 1 tablet by mouth daily.   Potassium Chloride ER 20 MEQ Tbcr Take 10 mEq by mouth daily.   spironolactone 25 MG tablet Commonly known as:  ALDACTONE Take 1 tablet (25 mg total) by mouth daily.  Durable Medical Equipment  (From admission, onward)        Start     Ordered   04/15/17 1502  For home use only DME Walker rolling  Once    Question:  Patient needs a walker to treat with the following condition  Answer:  Weakness   04/15/17 1502     Follow-up Information    [facility] [place] Follow up in 1 week(s).   Specialty:  Cardiothoracic Surgery Why:  for suture removal with nurse  Contact information: [place], [place]       [name], MD Follow up in 4 week(s).   Specialty:  Cardiothoracic Surgery Why:  Please get CXR 30 min prior to your appointment at [name] located on the first floor of our office  building Contact information: [place] [address] [phone]        [facility]. Go on 05/08/2017.   Specialty:  Cardiology Why:  2:30 PM, Advanced Heart Failure Clinic, parking code [place] information: [address] [id number] [place] [phone]          Signed: [name] 04/18/2017, 7:45 AM

## 2017-04-17 NOTE — Care Management Important Message (Signed)
Important Message  Patient Details  Name: [name] [name] MRN: [id number] Date of Birth: [birth year]   Medicare Important Message Given:  Yes    Nathen May 04/17/2017, 9:15 AM

## 2017-04-18 ENCOUNTER — Telehealth (HOSPITAL_COMMUNITY): Payer: Self-pay

## 2017-04-18 LAB — BASIC METABOLIC PANEL
ANION GAP: 7 (ref 5–15)
BUN: 21 mg/dL — AB (ref 6–20)
CHLORIDE: 97 mmol/L — AB (ref 101–111)
CO2: 27 mmol/L (ref 22–32)
Calcium: 8.4 mg/dL — ABNORMAL LOW (ref 8.9–10.3)
Creatinine, Ser: 1.57 mg/dL — ABNORMAL HIGH (ref 0.61–1.24)
GFR calc Af Amer: 49 mL/min — ABNORMAL LOW (ref >60)
GFR, EST NON AFRICAN AMERICAN: 43 mL/min — AB (ref >60)
GLUCOSE: 104 mg/dL — AB (ref 65–99)
POTASSIUM: 4.8 mmol/L (ref 3.5–5.1)
Sodium: 131 mmol/L — ABNORMAL LOW (ref 135–145)

## 2017-04-18 LAB — DIGOXIN LEVEL: Digoxin Level: 0.6 ng/mL — ABNORMAL LOW (ref 0.8–2.0)

## 2017-04-18 LAB — GLUCOSE, CAPILLARY: Glucose-Capillary: 114 mg/dL — ABNORMAL HIGH (ref 65–99)

## 2017-04-18 LAB — ECHOCARDIOGRAM LIMITED

## 2017-04-18 MED ORDER — AMIODARONE HCL 200 MG PO TABS: 200.0000 mg | ORAL_TABLET | Freq: Two times a day (BID) | ORAL | 1 refills | Status: DC

## 2017-04-18 MED ORDER — OXYCODONE HCL 5 MG PO TABS: 5.0000 mg | ORAL_TABLET | Freq: Four times a day (QID) | ORAL | 0 refills | Status: DC | PRN

## 2017-04-18 MED ORDER — LOSARTAN POTASSIUM 25 MG PO TABS: 12.5000 mg | ORAL_TABLET | Freq: Two times a day (BID) | ORAL | 1 refills | Status: DC

## 2017-04-18 MED ORDER — DIGOXIN 125 MCG PO TABS: 0.1250 mg | ORAL_TABLET | Freq: Every day | ORAL | 1 refills | Status: DC

## 2017-04-18 MED ORDER — FUROSEMIDE 80 MG PO TABS: 80.0000 mg | ORAL_TABLET | Freq: Every day | ORAL | 1 refills | Status: DC

## 2017-04-18 MED ORDER — ASPIRIN 325 MG PO TBEC: 325.0000 mg | DELAYED_RELEASE_TABLET | Freq: Every day | ORAL | Status: DC

## 2017-04-18 MED ORDER — ATORVASTATIN CALCIUM 80 MG PO TABS: 80.0000 mg | ORAL_TABLET | Freq: Every day | ORAL | 1 refills | Status: DC

## 2017-04-18 MED ORDER — POTASSIUM CHLORIDE ER 20 MEQ PO TBCR: 10.0000 meq | EXTENDED_RELEASE_TABLET | Freq: Every day | ORAL | 1 refills | Status: DC

## 2017-04-18 MED ORDER — SPIRONOLACTONE 25 MG PO TABS: 25.0000 mg | ORAL_TABLET | Freq: Every day | ORAL | 1 refills | Status: DC

## 2017-04-18 NOTE — Telephone Encounter (Signed)
LVMTCB advising patient to return call to go over medication instructions.  Renee Pain, RN

## 2017-04-18 NOTE — Care Management Note (Deleted)
Case Management Note  Patient Details  Name: [name] [name] MRN: [id number] Date of Birth: [birth year]  Subjective/Objective:                    Action/Plan:   Expected Discharge Date:  04/18/17               Expected Discharge Plan:  [place]  In-House Referral:     Discharge planning Services  CM Consult  Post Acute Care Choice:  Home Health Choice offered to:  Spouse  DME Arranged:  3-N-1, Walker rolling DME Agency:     HH Arranged:  PT [place]:  [place]  Status of Service:  Completed, signed off  If discussed at [place] of Stay Meetings, dates discussed:    Additional Comments:  Sharin Mons, RN 04/18/2017, 12:20 PM

## 2017-04-18 NOTE — Progress Notes (Signed)
Discussed with the patient and all questioned fully answered. He will call me if any problems arise.  PICC removed by IV team. Telemetry removed, CCMD notified. Pt given paper Rx including oxycodone. Pt and wife given copy of discharge paperwork. Education regarding sternal precautions, incision care, s/s of infection, medications, and follow up.  Fritz Pickerel, RN

## 2017-04-18 NOTE — Progress Notes (Signed)
Physical Therapy Treatment Patient Details Name: [name] [name] MRN: [id number] DOB: 18-Nov-1945 Today's Date: 04/18/2017    History of Present Illness Pt is a 71 y/o male with PMH significant for HTN, DM, Bladder CA, but no cardiac hx, who presented to ED on 03/31/17 with acute c/o dyspnea, thought to be due to flu symptoms, but found to have STEMI by ECG, ARF with hypoxia on 6L and acute systolic HF.   S/P Cath.  S/P CABGx2 on 12/11.    PT Comments    Pt performed increased gait and performed stair training in prep for d/c home.  Pt impulsive and required VCs to maintain sternal precautions. Wife and patient educated on stair training, transfer and bed mobility techniques to ensure safety at home.  Plan to return home with assistance from wife remains appropriate.     Follow Up Recommendations  Home health PT;Supervision/Assistance - 24 hour     Equipment Recommendations  Rolling walker with 5" wheels;3in1 (PT)    Recommendations for Other Services       Precautions / Restrictions Precautions Precautions: Fall;Sternal Precaution Comments: Watch HR--Labile on eval, sternal precautions Restrictions Weight Bearing Restrictions: Yes(sternal precautions)    Mobility  Bed Mobility Overal bed mobility: Needs Assistance Bed Mobility: Rolling;Sidelying to Sit;Sit to Sidelying Rolling: Min guard(cues for hooklying position to push with B LEs.  ) Sidelying to sit: Mod assist     Sit to sidelying: Supervision General bed mobility comments: Pt required mod assist for trunk elevation into sitting. Pt able to follow commands for rolling and positioning into sidelying.    Transfers Overall transfer level: Needs assistance Equipment used: Rolling walker (2 wheeled) Transfers: Sit to/from Stand Sit to Stand: Supervision         General transfer comment: Cues for sternal precautions, as pt pushing for chair arm rests to stand. Cues to place hands on knees to elevate into standing to  comply with sternal precautions.   Ambulation/Gait Ambulation/Gait assistance: Supervision Ambulation Distance (Feet): 115 Feet(x2 trials with seated break midway due to fatigue and increased work of breathing. ) Assistive device: Rolling walker (2 wheeled) Gait Pattern/deviations: Step-through pattern;Trunk flexed;Narrow base of support Gait velocity: Decreased Gait velocity interpretation: Below normal speed for age/gender General Gait Details: Pt agreeable to ambulate cues for safety for positioning in RW.     Stairs Stairs: Yes   Stair Management: One rail Left Number of Stairs: 10 General stair comments: Pt required min guard.  pt performing to quickly which decreases safety.  Wife reports she will be present with patient at d.c and wife educated on assisting patient and managing DME at home.  Wheelchair Mobility    Modified Rankin (Stroke Patients Only)       Balance Overall balance assessment: Needs assistance   Sitting balance-Leahy Scale: Good       Standing balance-Leahy Scale: Fair Standing balance comment: Able to amb with no AD and supervision for safety in room.                              Cognition Arousal/Alertness: Awake/alert Behavior During Therapy: WFL for tasks assessed/performed Overall Cognitive Status: Within Functional Limits for tasks assessed                                        Exercises  General Comments        Pertinent Vitals/Pain Pain Assessment: 0-10 Pain Score: 2  Pain Location: Sternal Pain Descriptors / Indicators: Sore Pain Intervention(s): Monitored during session;Repositioned    Home Living                      Prior Function            PT Goals (current goals can now be found in the care plan section) Acute Rehab PT Goals Patient Stated Goal: Return home today Potential to Achieve Goals: Good Progress towards PT goals: Progressing toward goals    Frequency     Min 3X/week      PT Plan Current plan remains appropriate    Co-evaluation              AM-PAC PT "6 Clicks" Daily Activity  Outcome Measure  Difficulty turning over in bed (including adjusting bedclothes, sheets and blankets)?: A Little Difficulty moving from lying on back to sitting on the side of the bed? : Unable Difficulty sitting down on and standing up from a chair with arms (e.g., wheelchair, bedside commode, etc,.)?: Unable Help needed moving to and from a bed to chair (including a wheelchair)?: A Little Help needed walking in hospital room?: A Little Help needed climbing 3-5 steps with a railing? : A Little 6 Click Score: 14    End of Session Equipment Utilized During Treatment: Gait belt Activity Tolerance: Patient tolerated treatment well Patient left: in bed;with call bell/phone within reach;with family/visitor present Nurse Communication: Mobility status PT Visit Diagnosis: Unsteadiness on feet (R26.81);Difficulty in walking, not elsewhere classified (R26.2);Pain Pain - Right/Left: (midline) Pain - part of body: (sternum)     Time: 6168-3729 PT Time Calculation (min) (ACUTE ONLY): 13 min  Charges:  $Gait Training: 8-22 mins                    G Codes:       Governor Rooks, PTA pager [phone]    Cristela Blue 04/18/2017, 9:43 AM

## 2017-04-18 NOTE — Telephone Encounter (Signed)
Patients insurance is active and benefits verified through Medicare Part A & B - No co-pay, deductible amount of $183.00/$183.00 has been met, no out of pocket, 20% co-insurance, and no pre-authorization is required. Passport/reference [id number]  Patients insurance is active and benefits verified through [facility] - No co-pay, deductible amount of $350.00/$0.00 has been met, out of pocket amount of $5,000/$70.82 has been met, 15% co-insurance, and no pre-authorization is required. Passport/reference [phone]  Patient will be contacted and scheduled after their follow up appt with the Cardiologist office upon review by the RN Navigator.

## 2017-04-18 NOTE — Telephone Encounter (Signed)
-----   Message from Shirley Friar, PA-C sent at 04/18/2017 11:07 AM EST -----  Could you all please call him and tell him NOT to take potassium? He was discharged on but was stable today so was supposed to be discontinued by TCTS.     He may end up back on it with diuretics, but doesn't need for now.    Thanks!   Legrand Como [address]" [place], PA-C 04/18/2017 11:08 AM

## 2017-04-18 NOTE — Progress Notes (Signed)
      [address] CitySuite [address]       [place] [address]             [phone]      10 Days Post-Op Procedure(s) (LRB): CORONARY ARTERY BYPASS GRAFTING (CABG) TIMES TWO USING LEFT INTERNAL MAMMARY ARTERY AND LEFT SAPHENOUS LEG VEIN HARVESTED ENDOSCOPICALLY.  LEG VEIN ALSO HARVESTED FROM THE RIGHT LEG (N/A) TRANSESOPHAGEAL ECHOCARDIOGRAM (TEE) (N/A) Subjective: conts to feel well  Objective: Vital signs in last 24 hours: Temp:  [97.8 F (36.6 C)-98.4 F (36.9 C)] 97.8 F (36.6 C) (12/20 0640) Pulse Rate:  [66-73] 66 (12/20 0640) Cardiac Rhythm: Heart block (12/19 2000) Resp:  [15-18] 16 (12/20 0640) BP: (117-126)/(58-63) 118/58 (12/20 0640) SpO2:  [96 %-97 %] 96 % (12/20 0640) Weight:  [198 lb 3.2 oz (89.9 kg)] 198 lb 3.2 oz (89.9 kg) (12/20 0640)  Hemodynamic parameters for last 24 hours:    Intake/Output from previous day: 12/19 0701 - 12/20 0700 In: 120 [P.O.:120] Out: 300 [Urine:300] Intake/Output this shift: No intake/output data recorded.  General appearance: alert, cooperative and no distress Heart: regular rate and rhythm Lungs: min dim in bases Abdomen: benign Extremities: no edema Wound: incis healing well  Lab Results: Recent Labs    04/16/17 0500 04/17/17 0214  WBC 15.6* 13.0*  HGB 9.0* 9.3*  HCT 27.8* 28.9*  PLT 405* 409*   BMET:  Recent Labs    04/17/17 0214 04/18/17 0337  NA 129* 131*  K 4.3 4.8  CL 94* 97*  CO2 26 27  GLUCOSE 98 104*  BUN 24* 21*  CREATININE 1.67* 1.57*  CALCIUM 7.9* 8.4*    PT/INR: No results for input(s): LABPROT, INR in the last 72 hours. ABG    Component Value Date/Time   PHART 7.398 04/08/2017 1843   HCO3 23.2 04/08/2017 1843   TCO2 22 04/09/2017 1730   ACIDBASEDEF 2.0 04/08/2017 1843   O2SAT 84.2 04/16/2017 1230   CBG (last 3)  Recent Labs    04/17/17 1637 04/17/17 2200 04/18/17 0637  GLUCAP 123* 136* 114*    Meds Scheduled Meds: . amiodarone  400 mg Oral BID  . aspirin EC  325 mg  Oral Daily  . atorvastatin  80 mg Oral QHS  . Chlorhexidine Gluconate Cloth  6 each Topical Daily  . digoxin  0.125 mg Oral Daily  . enoxaparin (LOVENOX) injection  30 mg Subcutaneous QHS  . furosemide  80 mg Oral Daily  . insulin aspart  0-24 Units Subcutaneous TID AC & HS  . insulin detemir  20 Units Subcutaneous Daily  . losartan  12.5 mg Oral BID  . mouth rinse  15 mL Mouth Rinse BID  . pantoprazole  40 mg Oral Daily  . potassium chloride  40 mEq Oral Daily  . sodium chloride flush  3 mL Intravenous Q12H  . spironolactone  25 mg Oral Daily   Continuous Infusions: . sodium chloride     PRN Meds:.sodium chloride, bisacodyl **OR** bisacodyl, ondansetron **OR** ondansetron (ZOFRAN) IV, oxyCODONE, sodium chloride flush, traMADol  Xrays No results found.  Assessment/Plan: S/P Procedure(s) (LRB): CORONARY ARTERY BYPASS GRAFTING (CABG) TIMES TWO USING LEFT INTERNAL MAMMARY ARTERY AND LEFT SAPHENOUS LEG VEIN HARVESTED ENDOSCOPICALLY.  LEG VEIN ALSO HARVESTED FROM THE RIGHT LEG (N/A) TRANSESOPHAGEAL ECHOCARDIOGRAM (TEE) (N/A)  1 doing well, meds as per AHF team outlined, stable for d/c home     LOS: 18 days    [name] [name] 04/18/2017

## 2017-04-24 ENCOUNTER — Encounter (INDEPENDENT_AMBULATORY_CARE_PROVIDER_SITE_OTHER): Payer: Self-pay

## 2017-04-24 ENCOUNTER — Telehealth (HOSPITAL_COMMUNITY): Payer: Self-pay

## 2017-04-24 DIAGNOSIS — Z951 Presence of aortocoronary bypass graft: Secondary | ICD-10-CM

## 2017-04-24 DIAGNOSIS — Z4802 Encounter for removal of sutures: Secondary | ICD-10-CM

## 2017-04-24 NOTE — Telephone Encounter (Signed)
Left VM to call back to go over instructions. Also reminded of upcoming app ton 1/9  Renee Pain, RN

## 2017-04-24 NOTE — Telephone Encounter (Signed)
-----   Message from Shirley Friar, PA-C sent at 04/18/2017 11:07 AM EST -----  Could you all please call him and tell him NOT to take potassium? He was discharged on but was stable today so was supposed to be discontinued by TCTS.     He may end up back on it with diuretics, but doesn't need for now.    Thanks!   Legrand Como [address]" [place], PA-C 04/18/2017 11:08 AM

## 2017-04-26 ENCOUNTER — Telehealth (HOSPITAL_COMMUNITY): Payer: Self-pay | Admitting: *Deleted

## 2017-04-26 MED ORDER — FUROSEMIDE 80 MG PO TABS: ORAL_TABLET | ORAL | 1 refills | Status: DC

## 2017-04-26 NOTE — Telephone Encounter (Signed)
Patient aware and voiced understanding  Patient will decrease FUROSEMIDE to 40 mg with an additional 40 as needed

## 2017-04-26 NOTE — Telephone Encounter (Signed)
Advanced Heart Failure Triage Encounter  Patient Name: [name] [name]  Date of Call: 04/26/17  Problem: Request for medication changes  Patient's wife called asking if his diuretics can be decreased as his weight is down to 188 lbs.  Last weight documented was on 12/20 198 lbs.  Patient takes 80 mg of torsemide daily and spiro 25 mg daily.   Plan:  Will send to Barrington Ellison, PA to review and will call back with his response.     Darron Doom, RN

## 2017-04-26 NOTE — Telephone Encounter (Signed)
Hold for 2 days   Then go to 40 mg daily, with extra 40 mg as needed.     Legrand Como [address]" [place], PA-C 04/26/2017 10:38 AM

## 2017-05-03 ENCOUNTER — Other Ambulatory Visit (HOSPITAL_COMMUNITY): Payer: Self-pay | Admitting: *Deleted

## 2017-05-03 MED ORDER — LOSARTAN POTASSIUM 25 MG PO TABS: 12.5000 mg | ORAL_TABLET | Freq: Two times a day (BID) | ORAL | 3 refills | Status: DC

## 2017-05-03 MED FILL — Sodium Bicarbonate IV Soln 8.4%: INTRAVENOUS | Qty: 50 | Status: AC

## 2017-05-03 MED FILL — Mannitol IV Soln 20%: INTRAVENOUS | Qty: 500 | Status: AC

## 2017-05-03 MED FILL — Electrolyte-R (PH 7.4) Solution: INTRAVENOUS | Qty: 3000 | Status: AC

## 2017-05-03 MED FILL — Sodium Chloride IV Soln 0.9%: INTRAVENOUS | Qty: 2000 | Status: AC

## 2017-05-03 MED FILL — Heparin Sodium (Porcine) Inj 1000 Unit/ML: INTRAMUSCULAR | Qty: 30 | Status: AC

## 2017-05-03 MED FILL — Lidocaine HCl IV Inj 20 MG/ML: INTRAVENOUS | Qty: 5 | Status: AC

## 2017-05-08 ENCOUNTER — Encounter (HOSPITAL_COMMUNITY): Payer: Self-pay

## 2017-05-08 ENCOUNTER — Ambulatory Visit (HOSPITAL_BASED_OUTPATIENT_CLINIC_OR_DEPARTMENT_OTHER)
Admission: RE | Admit: 2017-05-08 | Discharge: 2017-05-08 | Disposition: A | Discharge status: Home / Self Care | Payer: Medicare Other | Source: Ambulatory Visit | Attending: Internal Medicine | Admitting: Internal Medicine

## 2017-05-08 ENCOUNTER — Telehealth (HOSPITAL_COMMUNITY): Payer: Self-pay

## 2017-05-08 VITALS — BP 90/58 | HR 74 | Wt 185.4 lb

## 2017-05-08 DIAGNOSIS — N453 Epididymo-orchitis: Secondary | ICD-10-CM | POA: Diagnosis not present

## 2017-05-08 DIAGNOSIS — Z7982 Long term (current) use of aspirin: Secondary | ICD-10-CM

## 2017-05-08 DIAGNOSIS — Z7984 Long term (current) use of oral hypoglycemic drugs: Secondary | ICD-10-CM | POA: Insufficient documentation

## 2017-05-08 DIAGNOSIS — Z87891 Personal history of nicotine dependence: Secondary | ICD-10-CM | POA: Insufficient documentation

## 2017-05-08 DIAGNOSIS — Z9889 Other specified postprocedural states: Secondary | ICD-10-CM | POA: Insufficient documentation

## 2017-05-08 DIAGNOSIS — I110 Hypertensive heart disease with heart failure: Secondary | ICD-10-CM | POA: Insufficient documentation

## 2017-05-08 DIAGNOSIS — Z8551 Personal history of malignant neoplasm of bladder: Secondary | ICD-10-CM | POA: Insufficient documentation

## 2017-05-08 DIAGNOSIS — I951 Orthostatic hypotension: Secondary | ICD-10-CM

## 2017-05-08 DIAGNOSIS — E118 Type 2 diabetes mellitus with unspecified complications: Secondary | ICD-10-CM

## 2017-05-08 DIAGNOSIS — Z951 Presence of aortocoronary bypass graft: Secondary | ICD-10-CM

## 2017-05-08 DIAGNOSIS — Z720 Tobacco use: Secondary | ICD-10-CM

## 2017-05-08 DIAGNOSIS — I2510 Atherosclerotic heart disease of native coronary artery without angina pectoris: Secondary | ICD-10-CM

## 2017-05-08 DIAGNOSIS — I5042 Chronic combined systolic (congestive) and diastolic (congestive) heart failure: Secondary | ICD-10-CM

## 2017-05-08 DIAGNOSIS — Z79899 Other long term (current) drug therapy: Secondary | ICD-10-CM

## 2017-05-08 DIAGNOSIS — I5022 Chronic systolic (congestive) heart failure: Secondary | ICD-10-CM

## 2017-05-08 DIAGNOSIS — N452 Orchitis: Secondary | ICD-10-CM | POA: Diagnosis not present

## 2017-05-08 DIAGNOSIS — E119 Type 2 diabetes mellitus without complications: Secondary | ICD-10-CM

## 2017-05-08 LAB — BASIC METABOLIC PANEL
ANION GAP: 11 (ref 5–15)
BUN: 27 mg/dL — ABNORMAL HIGH (ref 6–20)
CALCIUM: 9 mg/dL (ref 8.9–10.3)
CO2: 25 mmol/L (ref 22–32)
CREATININE: 1.62 mg/dL — AB (ref 0.61–1.24)
Chloride: 96 mmol/L — ABNORMAL LOW (ref 101–111)
GFR, EST AFRICAN AMERICAN: 48 mL/min — AB (ref >60)
GFR, EST NON AFRICAN AMERICAN: 41 mL/min — AB (ref >60)
Glucose, Bld: 101 mg/dL — ABNORMAL HIGH (ref 65–99)
Potassium: 4.4 mmol/L (ref 3.5–5.1)
Sodium: 132 mmol/L — ABNORMAL LOW (ref 135–145)

## 2017-05-08 LAB — CBC
HCT: 42.4 % (ref 39.0–52.0)
HEMOGLOBIN: 13.5 g/dL (ref 13.0–17.0)
MCH: 28.8 pg (ref 26.0–34.0)
MCHC: 31.8 g/dL (ref 30.0–36.0)
MCV: 90.6 fL (ref 78.0–100.0)
PLATELETS: 390 10*3/uL (ref 150–400)
RBC: 4.68 MIL/uL (ref 4.22–5.81)
RDW: 15.4 % (ref 11.5–15.5)
WBC: 10.3 10*3/uL (ref 4.0–10.5)

## 2017-05-08 LAB — DIGOXIN LEVEL: Digoxin Level: 1.1 ng/mL (ref 0.8–2.0)

## 2017-05-08 NOTE — Progress Notes (Signed)
Advanced Heart Failure Clinic Note   Primary Cardiologist: Dr. Haroldine Laws   HPI:  [name] [name] is a 72 y.o. male with h/o DM2, COPD (quit smoking 12/18), severe 3v CAD s/p CABG 87/68/1157, chronic systolic HF due to ICM, EF 25-30% (echo 12/18),  HTN, DM2, h/o bladder CA s/p multiple rounds of BCG therapy, and paroxysmal AF.   Pt admitted in 12/18 with anterior STEMI and developed cardiogenic shock requiring Impella support. Urgent cath showed severe 3v CAD as below. While on impella, pt developed sepsis of unclear source with WBC ~ 40K, though did have pseudomonas on his UCx. Treated for AECOPD and completed ABX therapy. BCx negative. TCTS followed while on pump for CABG.  Pt stabilized and underwent CABG x 2 with LIMA to LAD and SVG to OM on 04/08/2017. Pt transiently required milrinone but weaned prior to discharge. Required amiodarone for brief post op afib, but had no recurrence.   Pt presents today for post hospital/post CABG follow up. He had been doing well overall, but called on 04/26/18 with continued weight loss. Lasix cut back.  Per wife, pt has continued to lose weight and has felt increasing lightheadedness over the past 5 days. He has been mostly sedentary and had occasional near syncopal events, but quickly sits back down. He denies SOB or CP, but as above has not been very active. He has mild SOB at times when he lies flat, usually when he first lies down at night, but then quickly resolves.  He has been taking all medication as directed.No longer smoking. No palpitations.   Review of systems complete and found to be negative unless listed in HPI.    [facility] 03/31/2017  Prox RCA lesion is 100% stenosed. Dist RCA lesion is 100% stenosed (retrograde filling from collaterals does not go beyond the distal vessel) -->faint collateral filling to the distal PL and PDA from septal perforators and a diffusely diseased AV groove circumflex  LAV Groove lesion is 90% stenosed.  Prox LAD-1  lesion is 95% stenosed. Prox LAD-2 lesion is 85% stenosed. Heavily calcified, tandem lesions in very tortuous segment of the vessel.  Prox Cx to Mid Cx lesion is 70% stenosed. Heavily calcified  There is severe left ventricular systolic dysfunction. The left ventricular ejection fraction is less than 25% by visual estimate.  LV end diastolic pressure is severely elevated -prior to Impella insertion  Hemodynamic findings consistent with moderate pulmonary hypertension.  Successful Impella insertion with 3.5-3.6 LPM flow's  Echo 04/01/2017 EF 15-20%  Echo 04/16/17 (post op) Improved to 25-30% on 04/16/17  Past Medical History:  Diagnosis Date  . Bladder cancer ([place])   . Borderline glaucoma   . Diabetes mellitus type 2, controlled ([place]) ORAL MED  . Essential hypertension     Current Outpatient Medications  Medication Sig Dispense Refill  . amiodarone (PACERONE) 200 MG tablet Take 1 tablet (200 mg total) by mouth 2 (two) times daily. 60 tablet 1  . aspirin EC 325 MG EC tablet Take 1 tablet (325 mg total) by mouth daily.    [name] atorvastatin (LIPITOR) 80 MG tablet Take 1 tablet (80 mg total) by mouth at bedtime. 30 tablet 1  . digoxin (LANOXIN) 0.125 MG tablet Take 1 tablet (0.125 mg total) by mouth daily. 30 tablet 1  . furosemide (LASIX) 80 MG tablet Take 0.5 tablets (40 mg total) by mouth daily. May also take 0.5 tablets (40 mg total) at bedtime as needed. 30 tablet 1  . losartan (COZAAR) 25  MG tablet Take 0.5 tablets (12.5 mg total) by mouth 2 (two) times daily. 30 tablet 3  . mometasone (NASONEX) 50 MCG/ACT nasal spray Place 1-2 sprays into the nose daily as needed for allergies.    [name] oxyCODONE (OXY IR/ROXICODONE) 5 MG immediate release tablet Take 1 tablet (5 mg total) by mouth every 6 (six) hours as needed for severe pain. 30 tablet 0  . pioglitazone-metformin (ACTOPLUS MET) 15-500 MG per tablet Take 1 tablet by mouth daily.     [name] spironolactone (ALDACTONE) 25 MG tablet Take 1 tablet  (25 mg total) by mouth daily. 30 tablet 1   No current facility-administered medications for this encounter.     No Known Allergies    Social History   Socioeconomic History  . Marital status: Married    Spouse name: Not on file  . Number of children: Not on file  . Years of education: Not on file  . Highest education level: Not on file  Social Needs  . Financial resource strain: Not on file  . Food insecurity - worry: Not on file  . Food insecurity - inability: Not on file  . Transportation needs - medical: Not on file  . Transportation needs - non-medical: Not on file  Occupational History  . Not on file  Tobacco Use  . Smoking status: Former Smoker    Packs/day: 1.00    Years: 40.00    Pack years: 40.00    Types: Cigarettes    Last attempt to quit: 03/31/2017    Years since quitting: 0.1  . Smokeless tobacco: Never Used  Substance and Sexual Activity  . Alcohol use: No  . Drug use: No  . Sexual activity: Not on file  Other Topics Concern  . Not on file  Social History Narrative  . Not on file      Family History  Problem Relation Age of Onset  . Heart attack Sister   . Heart disease Sister     Vitals:   05/08/17 1434 05/08/17 1447  BP: (!) 110/58 (!) 160/64  Pulse: 60 (!) 55  SpO2: 99% 98%  Weight: 185 lb 6.4 oz (84.1 kg)    Wt Readings from Last 3 Encounters:  05/08/17 185 lb 6.4 oz (84.1 kg)  04/18/17 198 lb 3.2 oz (89.9 kg)  08/01/12 205 lb (93 kg)     PHYSICAL EXAM: General:  Pale. NAD HEENT: Dry mucous membranes. Neck: supple. no JVD. Carotids 2+ bilat; no bruits. No lymphadenopathy or thyromegaly appreciated. Cor: Sternotomy scar well-healed. PMI nondisplaced. Regular. No rubs, gallops or murmurs. Lungs: clear with decreased BS throughout  Abdomen: soft, nontender, nondistended. No hepatosplenomegaly. No bruits or masses. Good bowel sounds. Extremities: no cyanosis, clubbing, rash, or edema Neuro: alert & oriented x 3, cranial nerves  grossly intact. moves all 4 extremities w/o difficulty. Affect pleasant.  ASSESSMENT & PLAN:  1. Chronic systolic CHF due to ICM  - EF 04/01/17 EF 15-20% -> Improved to 25-30% on 04/16/17 Post op CABG as below. Bedside echo by Dr. Haroldine Laws appears ~30% with HK apex. - NYHA III, but limited currently by hypotension.  - Volume status depleted. STOP lasix.  Will need to gain 5-7 lbs back, then can use as needed in the future. BMET today.  - Continue digoxin 0.125. Check level today.  - Hold spironolactone 25 mg daily until 05/11/17. - Continue losartan 12.5 BID (but none tonight or tomorrow am) - Discussed fluid restriction to < 2 L daily, sodium restriction  to less than 2000 mg daily, and the importance of daily weights.  Cleared to liberalize salt and fluid intake into the weekend.  - Will refer to cardiac rehab for him to start once he has improved.  2. CAD - s/p CABG with LIMA to LAD and SVG to OM on 04/08/2017 - No s/s of ischemia. - Continue ASA and statin.   3. Tobacco abuse: Smoked 1 ppd PTA for > 50 years.  - Has stopped smoking. Congratulated on success thus far.   4. Hx of bladder CA:  - s/p Multiple rounds of BCG therapy. Has been stable.   5. Post op Atrial fibrillation - Remains NSR on exam.  - Decrease amiodarone to 200 mg daily. Will stop at next visit if remains in NSR.   6. Orthostatic hypotension - Profound on exam with BP as high as 160 and dropping to 90-100 with standing - Hold lasix. Use only as needed for now.  Refused IVF in clinic. Prefers to go home and liberalize diet.  Given po fluids in clinic as well.  - ReDS vest 19.  - Liberalize salt and fluid intake today.   Discussed all above with Dr. Haroldine Laws. Volume depleted with marked orthostatic hypotension. Stopping scheduled lasix as above.   [name] Friar, PA-C 05/08/17   Patient seen and examined with the above-signed Advanced Practice Provider and/or Housestaff. I personally reviewed  laboratory data, imaging studies and relevant notes. I independently examined the patient and formulated the important aspects of the plan. I have edited the note to reflect any of my changes or salient points. I have personally discussed the plan with the patient and/or family.   Overall doing fairly well since prolonged admission for anterior infarct, shock, sepsis and CABG. Currently he has been overdiuresed and is quite orthostatic. We suggested IVF but he refused. Echo done personally at bedside with EF 30-35%. We will stop all diuretics and liberalize fluid intake. He will call immediately if not improving. Congratulated on smoking cessation. Refer to CR. Will reepat echo in 2-3 months to assess need for ICD.  Total time spent 35 minutes. Over half that time spent discussing above.   Glori Bickers, MD  10:52 PM

## 2017-05-08 NOTE — Telephone Encounter (Signed)
Advised per Oda Kilts PA-C VO to reduce amiodarone to 200 mg once daily.  Renee Pain, RN

## 2017-05-08 NOTE — Patient Instructions (Addendum)
HOLD Spironolactone until Saturday.  HOLD Losartan today only. Start tomorrow and take at bedtime every evening.  Routine lab work today. Will notify you of abnormal results, otherwise no news is good news!  Have a salty snack and drink extra fluid today and tomorrow. (Enjoy!)  Follow up 2 weeks with Oda Kilts PA-C.  Take all medication as prescribed the day of your appointment. Bring all medications with you to your appointment.  Do the following things EVERYDAY: 1) Weigh yourself in the morning before breakfast. Write it down and keep it in a log. 2) Take your medicines as prescribed 3) Eat low salt foods-Limit salt (sodium) to 2000 mg per day.  4) Stay as active as you can everyday 5) Limit all fluids for the day to less than 2 liters

## 2017-05-10 ENCOUNTER — Emergency Department (HOSPITAL_COMMUNITY): Payer: Medicare Other

## 2017-05-10 ENCOUNTER — Other Ambulatory Visit: Payer: Self-pay

## 2017-05-10 ENCOUNTER — Inpatient Hospital Stay (HOSPITAL_COMMUNITY)
Admission: EM | Admit: 2017-05-10 | Discharge: 2017-05-13 | DRG: 728 | Disposition: A | Discharge status: Home / Self Care | Payer: Medicare Other | Attending: Internal Medicine | Admitting: Internal Medicine

## 2017-05-10 ENCOUNTER — Encounter (HOSPITAL_COMMUNITY): Payer: Self-pay | Admitting: *Deleted

## 2017-05-10 ENCOUNTER — Telehealth (HOSPITAL_COMMUNITY): Payer: Self-pay | Admitting: Cardiology

## 2017-05-10 DIAGNOSIS — N452 Orchitis: Secondary | ICD-10-CM

## 2017-05-10 DIAGNOSIS — I5042 Chronic combined systolic (congestive) and diastolic (congestive) heart failure: Secondary | ICD-10-CM | POA: Diagnosis not present

## 2017-05-10 DIAGNOSIS — N183 Chronic kidney disease, stage 3 unspecified: Secondary | ICD-10-CM | POA: Diagnosis present

## 2017-05-10 DIAGNOSIS — I2510 Atherosclerotic heart disease of native coronary artery without angina pectoris: Secondary | ICD-10-CM | POA: Diagnosis not present

## 2017-05-10 DIAGNOSIS — E118 Type 2 diabetes mellitus with unspecified complications: Secondary | ICD-10-CM | POA: Diagnosis not present

## 2017-05-10 DIAGNOSIS — Z951 Presence of aortocoronary bypass graft: Secondary | ICD-10-CM

## 2017-05-10 DIAGNOSIS — I252 Old myocardial infarction: Secondary | ICD-10-CM

## 2017-05-10 DIAGNOSIS — Z79899 Other long term (current) drug therapy: Secondary | ICD-10-CM

## 2017-05-10 DIAGNOSIS — Z87891 Personal history of nicotine dependence: Secondary | ICD-10-CM

## 2017-05-10 DIAGNOSIS — E119 Type 2 diabetes mellitus without complications: Secondary | ICD-10-CM

## 2017-05-10 DIAGNOSIS — I130 Hypertensive heart and chronic kidney disease with heart failure and stage 1 through stage 4 chronic kidney disease, or unspecified chronic kidney disease: Secondary | ICD-10-CM | POA: Diagnosis present

## 2017-05-10 DIAGNOSIS — N451 Epididymitis: Secondary | ICD-10-CM | POA: Diagnosis not present

## 2017-05-10 DIAGNOSIS — I480 Paroxysmal atrial fibrillation: Secondary | ICD-10-CM | POA: Diagnosis present

## 2017-05-10 DIAGNOSIS — R52 Pain, unspecified: Secondary | ICD-10-CM

## 2017-05-10 DIAGNOSIS — Z7982 Long term (current) use of aspirin: Secondary | ICD-10-CM

## 2017-05-10 DIAGNOSIS — I2583 Coronary atherosclerosis due to lipid rich plaque: Secondary | ICD-10-CM

## 2017-05-10 DIAGNOSIS — E1122 Type 2 diabetes mellitus with diabetic chronic kidney disease: Secondary | ICD-10-CM | POA: Diagnosis present

## 2017-05-10 DIAGNOSIS — R778 Other specified abnormalities of plasma proteins: Secondary | ICD-10-CM | POA: Diagnosis present

## 2017-05-10 DIAGNOSIS — E871 Hypo-osmolality and hyponatremia: Secondary | ICD-10-CM | POA: Diagnosis present

## 2017-05-10 DIAGNOSIS — I10 Essential (primary) hypertension: Secondary | ICD-10-CM | POA: Diagnosis present

## 2017-05-10 DIAGNOSIS — N453 Epididymo-orchitis: Principal | ICD-10-CM | POA: Diagnosis present

## 2017-05-10 DIAGNOSIS — R7989 Other specified abnormal findings of blood chemistry: Secondary | ICD-10-CM | POA: Diagnosis present

## 2017-05-10 DIAGNOSIS — N1832 Chronic kidney disease, stage 3b: Secondary | ICD-10-CM | POA: Diagnosis present

## 2017-05-10 DIAGNOSIS — Z8249 Family history of ischemic heart disease and other diseases of the circulatory system: Secondary | ICD-10-CM

## 2017-05-10 LAB — COMPREHENSIVE METABOLIC PANEL
ALT: 22 U/L (ref 17–63)
AST: 32 U/L (ref 15–41)
Albumin: 3.6 g/dL (ref 3.5–5.0)
Alkaline Phosphatase: 127 U/L — ABNORMAL HIGH (ref 38–126)
Anion gap: 14 (ref 5–15)
BILIRUBIN TOTAL: 0.8 mg/dL (ref 0.3–1.2)
BUN: 21 mg/dL — ABNORMAL HIGH (ref 6–20)
CALCIUM: 9.3 mg/dL (ref 8.9–10.3)
CO2: 21 mmol/L — ABNORMAL LOW (ref 22–32)
CREATININE: 1.52 mg/dL — AB (ref 0.61–1.24)
Chloride: 97 mmol/L — ABNORMAL LOW (ref 101–111)
GFR, EST AFRICAN AMERICAN: 51 mL/min — AB (ref >60)
GFR, EST NON AFRICAN AMERICAN: 44 mL/min — AB (ref >60)
Glucose, Bld: 109 mg/dL — ABNORMAL HIGH (ref 65–99)
Potassium: 4.4 mmol/L (ref 3.5–5.1)
Sodium: 132 mmol/L — ABNORMAL LOW (ref 135–145)
TOTAL PROTEIN: 7 g/dL (ref 6.5–8.1)

## 2017-05-10 LAB — CBC WITH DIFFERENTIAL/PLATELET
Basophils Absolute: 0.1 10*3/uL (ref 0.0–0.1)
Basophils Relative: 0 %
Eosinophils Absolute: 0.1 10*3/uL (ref 0.0–0.7)
Eosinophils Relative: 1 %
HCT: 42.4 % (ref 39.0–52.0)
Hemoglobin: 13.6 g/dL (ref 13.0–17.0)
LYMPHS ABS: 2.5 10*3/uL (ref 0.7–4.0)
LYMPHS PCT: 11 %
MCH: 29.1 pg (ref 26.0–34.0)
MCHC: 32.1 g/dL (ref 30.0–36.0)
MCV: 90.6 fL (ref 78.0–100.0)
MONO ABS: 1.4 10*3/uL — AB (ref 0.1–1.0)
Monocytes Relative: 6 %
Neutro Abs: 18.6 10*3/uL — ABNORMAL HIGH (ref 1.7–7.7)
Neutrophils Relative %: 82 %
Platelets: 382 10*3/uL (ref 150–400)
RBC: 4.68 MIL/uL (ref 4.22–5.81)
RDW: 15.5 % (ref 11.5–15.5)
WBC: 22.7 10*3/uL — AB (ref 4.0–10.5)

## 2017-05-10 LAB — GLUCOSE, CAPILLARY: Glucose-Capillary: 129 mg/dL — ABNORMAL HIGH (ref 65–99)

## 2017-05-10 MED ORDER — DIGOXIN 0.0625 MG HALF TABLET
0.0625 mg | ORAL_TABLET | Freq: Every day | ORAL | Status: DC
  Administered 2017-05-11 – 2017-05-13 (×3): 0.0625 mg via ORAL
  Filled 2017-05-10 (×3): qty 1

## 2017-05-10 MED ORDER — LEVOFLOXACIN IN D5W 750 MG/150ML IV SOLN
750.0000 mg | Freq: Once | INTRAVENOUS | Status: AC
Start: 2017-05-10 — End: 2017-05-10
  Administered 2017-05-10: 750 mg via INTRAVENOUS
  Filled 2017-05-10: qty 150

## 2017-05-10 MED ORDER — LOSARTAN POTASSIUM 25 MG PO TABS
12.5000 mg | ORAL_TABLET | Freq: Two times a day (BID) | ORAL | Status: DC
  Administered 2017-05-11 – 2017-05-13 (×4): 12.5 mg via ORAL
  Filled 2017-05-10 (×5): qty 0.5

## 2017-05-10 MED ORDER — SODIUM CHLORIDE 0.9 % IV SOLN
Freq: Once | INTRAVENOUS | Status: AC
  Administered 2017-05-10: 19:00:00 via INTRAVENOUS

## 2017-05-10 MED ORDER — ONDANSETRON HCL 4 MG/2ML IJ SOLN: 4.0000 mg | Freq: Four times a day (QID) | INTRAMUSCULAR | Status: DC | PRN

## 2017-05-10 MED ORDER — ATORVASTATIN CALCIUM 80 MG PO TABS
80.0000 mg | ORAL_TABLET | Freq: Every day | ORAL | Status: DC
  Administered 2017-05-11 – 2017-05-12 (×2): 80 mg via ORAL
  Filled 2017-05-10 (×3): qty 1

## 2017-05-10 MED ORDER — ONDANSETRON HCL 4 MG/2ML IJ SOLN
4.0000 mg | Freq: Once | INTRAMUSCULAR | Status: AC
  Administered 2017-05-10: 4 mg via INTRAVENOUS
  Filled 2017-05-10: qty 2

## 2017-05-10 MED ORDER — LEVOFLOXACIN IN D5W 750 MG/150ML IV SOLN: 750.0000 mg | INTRAVENOUS | Status: DC

## 2017-05-10 MED ORDER — LEVOFLOXACIN IN D5W 500 MG/100ML IV SOLN: 500.0000 mg | INTRAVENOUS | Status: DC

## 2017-05-10 MED ORDER — AMIODARONE HCL 200 MG PO TABS
200.0000 mg | ORAL_TABLET | Freq: Every day | ORAL | Status: DC
Start: 2017-05-11 — End: 2017-05-13
  Administered 2017-05-11 – 2017-05-13 (×3): 200 mg via ORAL
  Filled 2017-05-10 (×3): qty 1

## 2017-05-10 MED ORDER — ONDANSETRON HCL 4 MG PO TABS: 4.0000 mg | ORAL_TABLET | Freq: Four times a day (QID) | ORAL | Status: DC | PRN

## 2017-05-10 MED ORDER — SENNOSIDES-DOCUSATE SODIUM 8.6-50 MG PO TABS: 1.0000 | ORAL_TABLET | Freq: Every evening | ORAL | Status: DC | PRN

## 2017-05-10 MED ORDER — BISACODYL 5 MG PO TBEC: 5.0000 mg | DELAYED_RELEASE_TABLET | Freq: Every day | ORAL | Status: DC | PRN

## 2017-05-10 MED ORDER — ASPIRIN EC 325 MG PO TBEC
325.0000 mg | DELAYED_RELEASE_TABLET | Freq: Every day | ORAL | Status: DC
  Administered 2017-05-11 – 2017-05-13 (×3): 325 mg via ORAL
  Filled 2017-05-10 (×3): qty 1

## 2017-05-10 MED ORDER — MORPHINE SULFATE (PF) 4 MG/ML IV SOLN
4.0000 mg | Freq: Once | INTRAVENOUS | Status: AC
  Administered 2017-05-10: 4 mg via INTRAVENOUS
  Filled 2017-05-10: qty 1

## 2017-05-10 MED ORDER — DIGOXIN 125 MCG PO TABS: 0.0625 mg | ORAL_TABLET | Freq: Every day | ORAL | 1 refills | Status: DC

## 2017-05-10 MED ORDER — HYDROMORPHONE HCL 1 MG/ML IJ SOLN
0.5000 mg | INTRAMUSCULAR | Status: DC | PRN
  Administered 2017-05-10 – 2017-05-11 (×2): 1 mg via INTRAVENOUS
  Filled 2017-05-10 (×2): qty 1

## 2017-05-10 MED ORDER — SODIUM CHLORIDE 0.9 % IV SOLN
Freq: Once | INTRAVENOUS | Status: AC
  Administered 2017-05-10: 21:00:00 via INTRAVENOUS

## 2017-05-10 MED ORDER — INSULIN ASPART 100 UNIT/ML ~~LOC~~ SOLN
0.0000 [IU] | Freq: Three times a day (TID) | SUBCUTANEOUS | Status: DC
  Administered 2017-05-11: 1 [IU] via SUBCUTANEOUS
  Administered 2017-05-11: 2 [IU] via SUBCUTANEOUS
  Administered 2017-05-12 (×2): 1 [IU] via SUBCUTANEOUS

## 2017-05-10 MED ORDER — INSULIN ASPART 100 UNIT/ML ~~LOC~~ SOLN: 0.0000 [IU] | Freq: Every day | SUBCUTANEOUS | Status: DC

## 2017-05-10 MED ORDER — OXYCODONE-ACETAMINOPHEN 5-325 MG PO TABS
1.0000 | ORAL_TABLET | Freq: Once | ORAL | Status: AC
  Administered 2017-05-10: 1 via ORAL
  Filled 2017-05-10: qty 1

## 2017-05-10 MED ORDER — ACETAMINOPHEN 325 MG PO TABS
650.0000 mg | ORAL_TABLET | Freq: Four times a day (QID) | ORAL | Status: DC | PRN
  Administered 2017-05-11: 650 mg via ORAL
  Filled 2017-05-10: qty 2

## 2017-05-10 MED ORDER — OXYCODONE HCL 5 MG PO TABS
5.0000 mg | ORAL_TABLET | ORAL | Status: DC | PRN
  Administered 2017-05-11 – 2017-05-12 (×3): 5 mg via ORAL
  Filled 2017-05-10 (×3): qty 1

## 2017-05-10 MED ORDER — ACETAMINOPHEN 650 MG RE SUPP: 650.0000 mg | Freq: Four times a day (QID) | RECTAL | Status: DC | PRN

## 2017-05-10 MED ORDER — HEPARIN SODIUM (PORCINE) 5000 UNIT/ML IJ SOLN
5000.0000 [IU] | Freq: Three times a day (TID) | INTRAMUSCULAR | Status: DC
  Administered 2017-05-10 – 2017-05-13 (×7): 5000 [IU] via SUBCUTANEOUS
  Filled 2017-05-10 (×7): qty 1

## 2017-05-10 NOTE — H&P (Signed)
History and Physical    [name] [name] VEH:[id number] DOB: 1945-05-12 DOA: 05/10/2017  PCP: Christain Sacramento, MD   Patient coming from: Home  Chief Complaint: Testicular pain   HPI: [name] [name] is a 72 y.o. male with medical history significant for type 2 diabetes mellitus, hypertension, and coronary artery disease status post CABG last month, now presenting to the emergency department for evaluation of testicular pain.  Patient suffered a STEMI last month, underwent CABG, and has been convalescing unremarkably until the development of acute bilateral testicular pain.  Patient reports that the pain became severe yesterday, but denies any fevers or purulent discharge.  He has never experienced similar symptoms previously.  Denies any recent chest pain or dyspnea.  ED Course: Upon arrival to the ED, patient is found to be afebrile, saturating well on room air, bradycardic in the low 50s, and with vitals otherwise stable.  EKG features a sinus bradycardia with rate 53 and nonspecific ST-T abnormalities.  Chemistry panel is notable for sodium of 132 and creatinine of 1.52, consistent with his apparent baseline.  CBC is notable for a leukocytosis to 22,700.  Ultrasound of the testes and scrotum is consistent with bilateral epididymitis.  Patient was given 750 cc normal saline, morphine, Percocet, and treated with empiric Levaquin in the ED.  He remained hemodynamically stable, in no apparent respiratory distress, and will be observed on the medical-surgical unit for ongoing evaluation and management of acute epididymitis, likely infectious.  Review of Systems:  All other systems reviewed and apart from HPI, are negative.  Past Medical History:  Diagnosis Date  . Bladder cancer ([place])   . Borderline glaucoma   . Diabetes mellitus type 2, controlled ([place]) ORAL MED  . Essential hypertension     Past Surgical History:  Procedure Laterality Date  . CAROTID DUPLEX SCAN  07-26-2010   BILATERAL  ICA  STENOSIS 1% - 39%  . CORONARY ARTERY BYPASS GRAFT N/A 04/08/2017   Procedure: CORONARY ARTERY BYPASS GRAFTING (CABG) TIMES TWO USING LEFT INTERNAL MAMMARY ARTERY AND LEFT SAPHENOUS LEG VEIN HARVESTED ENDOSCOPICALLY.  LEG VEIN ALSO HARVESTED FROM THE RIGHT LEG;  Surgeon: [name], MD;  Location: [facility];  Service: Open Heart Surgery;  Laterality: N/A;  . CYSTOSCOPY WITH BIOPSY N/A 08/01/2012   Procedure: CYSTOSCOPY WITH BIOPSY BLADDER BIOPSY   ;  Surgeon: Fredricka Bonine, MD;  Location: [facility];  Service: Urology;  Laterality: N/A;  . FULGURATION OF BLADDER TUMOR N/A 08/01/2012   Procedure: FULGURATION OF BLADDER TUMOR;  Surgeon: Fredricka Bonine, MD;  Location: [facility];  Service: Urology;  Laterality: N/A;  . LEFT HEART CATH AND CORONARY ANGIOGRAPHY N/A 03/31/2017   Procedure: LEFT HEART CATH AND CORONARY ANGIOGRAPHY;  Surgeon: Leonie Man, MD;  Location: [facility];  Service: Cardiovascular;  Laterality: N/A;  . RIGHT HEART CATH Right 03/31/2017   Procedure: RIGHT HEART CATH;  Surgeon: Leonie Man, MD;  Location: [place] [facility];  Service: Cardiovascular;  Laterality: Right;  . TEE WITHOUT CARDIOVERSION N/A 04/08/2017   Procedure: TRANSESOPHAGEAL ECHOCARDIOGRAM (TEE);  Surgeon: [name], MD;  Location: [facility];  Service: Open Heart Surgery;  Laterality: N/A;  . TRANSURETHRAL RESECTION OF BLADDER TUMOR  05/29/2011   Procedure: TRANSURETHRAL RESECTION OF BLADDER TUMOR (TURBT);  Surgeon: Fredricka Bonine, MD;  Location: [facility];  Service: Urology;  Laterality: N/A;  . VENTRICULAR ASSIST DEVICE INSERTION Right 03/31/2017   Procedure:  VENTRICULAR ASSIST DEVICE INSERTION;  Surgeon: Leonie Man, MD;  Location: [facility];  Service: Cardiovascular;  Laterality: Right;     reports that he quit smoking about 5 weeks ago. His smoking use included cigarettes. He has a 40.00 pack-year  smoking history. he has never used smokeless tobacco. He reports that he does not drink alcohol or use drugs.  No Known Allergies  Family History  Problem Relation Age of Onset  . Heart attack Sister   . Heart disease Sister      Prior to Admission medications   Medication Sig Start Date End Date Taking? Authorizing Provider  amiodarone (PACERONE) 200 MG tablet Take 1 tablet (200 mg total) by mouth 2 (two) times daily. Patient taking differently: Take 200 mg by mouth daily.  04/18/17  Yes Gold, Wilder Glade, PA-C  aspirin EC 325 MG EC tablet Take 1 tablet (325 mg total) by mouth daily. 04/18/17  Yes Gold, Wayne E, PA-C  atorvastatin (LIPITOR) 80 MG tablet Take 1 tablet (80 mg total) by mouth at bedtime. 04/18/17  Yes Gold, Patrick Jupiter E, PA-C  digoxin (LANOXIN) 0.125 MG tablet Take 0.5 tablets (0.0625 mg total) by mouth daily. 05/10/17  Yes Shirley Friar, PA-C  losartan (COZAAR) 25 MG tablet Take 0.5 tablets (12.5 mg total) by mouth 2 (two) times daily. 05/03/17  Yes Shirley Friar, PA-C  mometasone (NASONEX) 50 MCG/ACT nasal spray Place 1-2 sprays into the nose daily as needed for allergies. 12/31/16  Yes [provider]  oxyCODONE (OXY IR/ROXICODONE) 5 MG immediate release tablet Take 1 tablet (5 mg total) by mouth every 6 (six) hours as needed for severe pain. Patient taking differently: Take 5 mg by mouth at bedtime.  04/18/17  Yes Gold, Wayne E, PA-C  pioglitazone-metformin (ACTOPLUS MET) 15-500 MG per tablet Take 1 tablet by mouth daily.    Yes [provider]  spironolactone (ALDACTONE) 25 MG tablet Take 1 tablet (25 mg total) by mouth daily. 04/18/17  Yes Gold, Wayne E, PA-C  furosemide (LASIX) 80 MG tablet Take 0.5 tablets (40 mg total) by mouth daily. May also take 0.5 tablets (40 mg total) at bedtime as needed. Patient not taking: No sig reported 04/26/17   Shirley Friar, PA-C    Physical Exam: Vitals:   05/10/17 1815 05/10/17 1830 05/10/17 1915  05/10/17 2015  BP: 128/62 128/61 (!) 130/49 126/63  Pulse: 76 79 70 72  Resp:   18   Temp:      TempSrc:      SpO2: 96% 97% 95% 97%  Weight:      Height:          Constitutional: NAD, calm, in obvious discomfort Eyes: PERTLA, lids and conjunctivae normal ENMT: Mucous membranes are moist. Posterior pharynx clear of any exudate or lesions.   Neck: normal, supple, no masses, no thyromegaly Respiratory: clear to auscultation bilaterally, no wheezing, no crackles. Normal respiratory effort.   Cardiovascular: Rate ~50 and regular. No extremity edema. No significant JVD. Abdomen: No distension, no tenderness, no masses palpated. Bowel sounds normal.  Musculoskeletal: no clubbing / cyanosis. No joint deformity upper and lower extremities.    Skin: no significant rashes, lesions, ulcers. Warm, dry, well-perfused. Neurologic: CN 2-12 grossly intact. Sensation intact. Strength 5/5 in all 4 limbs.  Psychiatric: Alert and oriented x 3. Pleasant and cooperative.     Labs on Admission: I have personally reviewed following labs and imaging studies  CBC: Recent Labs  Lab 05/08/17  1541 05/10/17 1542  WBC 10.3 22.7*  NEUTROABS  --  18.6*  HGB 13.5 13.6  HCT 42.4 42.4  MCV 90.6 90.6  PLT 390 158   Basic Metabolic Panel: Recent Labs  Lab 05/08/17 1541 05/10/17 1542  NA 132* 132*  K 4.4 4.4  CL 96* 97*  CO2 25 21*  GLUCOSE 101* 109*  BUN 27* 21*  CREATININE 1.62* 1.52*  CALCIUM 9.0 9.3   GFR: Estimated Creatinine Clearance: 51.8 mL/min (A) (by C-G formula based on SCr of 1.52 mg/dL (H)). Liver Function Tests: Recent Labs  Lab 05/10/17 1542  AST 32  ALT 22  ALKPHOS 127*  BILITOT 0.8  PROT 7.0  ALBUMIN 3.6   No results for input(s): LIPASE, AMYLASE in the last 168 hours. No results for input(s): AMMONIA in the last 168 hours. Coagulation Profile: No results for input(s): INR, PROTIME in the last 168 hours. Cardiac Enzymes: No results for input(s): CKTOTAL, CKMB,  CKMBINDEX, TROPONINI in the last 168 hours. BNP (last 3 results) No results for input(s): PROBNP in the last 8760 hours. HbA1C: No results for input(s): HGBA1C in the last 72 hours. CBG: No results for input(s): GLUCAP in the last 168 hours. Lipid Profile: No results for input(s): CHOL, HDL, LDLCALC, TRIG, CHOLHDL, LDLDIRECT in the last 72 hours. Thyroid Function Tests: No results for input(s): TSH, T4TOTAL, FREET4, T3FREE, THYROIDAB in the last 72 hours. Anemia Panel: No results for input(s): VITAMINB12, FOLATE, FERRITIN, TIBC, IRON, RETICCTPCT in the last 72 hours. Urine analysis:    Component Value Date/Time   COLORURINE RED (A) 04/01/2017 0927   APPEARANCEUR CLOUDY (A) 04/01/2017 0927   LABSPEC  04/01/2017 0927    TEST NOT REPORTED DUE TO COLOR INTERFERENCE OF URINE PIGMENT   PHURINE  04/01/2017 0927    TEST NOT REPORTED DUE TO COLOR INTERFERENCE OF URINE PIGMENT   GLUCOSEU (A) 04/01/2017 0927    TEST NOT REPORTED DUE TO COLOR INTERFERENCE OF URINE PIGMENT   HGBUR (A) 04/01/2017 0927    TEST NOT REPORTED DUE TO COLOR INTERFERENCE OF URINE PIGMENT   BILIRUBINUR (A) 04/01/2017 0927    TEST NOT REPORTED DUE TO COLOR INTERFERENCE OF URINE PIGMENT   KETONESUR (A) 04/01/2017 0927    TEST NOT REPORTED DUE TO COLOR INTERFERENCE OF URINE PIGMENT   PROTEINUR (A) 04/01/2017 0927    TEST NOT REPORTED DUE TO COLOR INTERFERENCE OF URINE PIGMENT   NITRITE (A) 04/01/2017 0927    TEST NOT REPORTED DUE TO COLOR INTERFERENCE OF URINE PIGMENT   LEUKOCYTESUR (A) 04/01/2017 0927    TEST NOT REPORTED DUE TO COLOR INTERFERENCE OF URINE PIGMENT   Sepsis Labs: _0 (procalcitonin:4,lacticidven:4) )No results found for this or any previous visit (from the past 240 hour(s)).   Radiological Exams on Admission: US Scrotum  Result Date: 05/10/2017 CLINICAL DATA:  Bilateral testicular pain since last night. EXAM: SCROTAL ULTRASOUND DOPPLER ULTRASOUND OF THE TESTICLES TECHNIQUE: Complete  ultrasound examination of the testicles, epididymis, and other scrotal structures was performed. Color and spectral Doppler ultrasound were also utilized to evaluate blood flow to the testicles. COMPARISON:  None. FINDINGS: Right testicle Measurements: 4 x 3.1 x 3 cm. No microlithiasis visualized. There is increased vascularity of the right testicle. Left testicle Measurements: 2.9 x 2.2 x 1.8 cm. There is heterogeneous echotexture of the left testicle. Right epididymis: Heterogeneous echotexture with increased vascularity. There are right epididymal cysts. Left epididymis: Heterogeneous echotexture with increased vascularity. Hydrocele:  A right hydrocele is identified. Varicocele:  None  visualized. Pulsed Doppler interrogation of both testes demonstrates normal low resistance arterial and venous waveforms bilaterally. IMPRESSION: Right epididymal orchitis. Left epididymitis. The left testicle is small without increased vascularity. Electronically Signed   By: Abelardo Diesel M.D.   On: 05/10/2017 17:11   [place] Scrotom Doppler  Result Date: 05/10/2017 CLINICAL DATA:  Bilateral testicular pain since last night. EXAM: SCROTAL ULTRASOUND DOPPLER ULTRASOUND OF THE TESTICLES TECHNIQUE: Complete ultrasound examination of the testicles, epididymis, and other scrotal structures was performed. Color and spectral Doppler ultrasound were also utilized to evaluate blood flow to the testicles. COMPARISON:  None. FINDINGS: Right testicle Measurements: 4 x 3.1 x 3 cm. No microlithiasis visualized. There is increased vascularity of the right testicle. Left testicle Measurements: 2.9 x 2.2 x 1.8 cm. There is heterogeneous echotexture of the left testicle. Right epididymis: Heterogeneous echotexture with increased vascularity. There are right epididymal cysts. Left epididymis: Heterogeneous echotexture with increased vascularity. Hydrocele:  A right hydrocele is identified. Varicocele:  None visualized. Pulsed Doppler interrogation  of both testes demonstrates normal low resistance arterial and venous waveforms bilaterally. IMPRESSION: Right epididymal orchitis. Left epididymitis. The left testicle is small without increased vascularity. Electronically Signed   By: Abelardo Diesel M.D.   On: 05/10/2017 17:11    EKG: Independently reviewed. Sinus bradycardia (rate 53), non-specific ST-T abnormalities.   Assessment/Plan  1. Acute epididymitis  - Presents with acute bilateral testicular pain, found to have marked leukocytosis and ultrasound-findings consistent with epididymitis  - Likely infectious  - Treated with Levaquin in ED  - Check UA, culture, check chlamydia and gonorrhea, continue current abx, continue supportive care with prn analgesia   2. CAD  - No anginal complaints  - Suffered STEMI last month and underwent CABG  - Continue ASA, Lipitor, losartan    3. Chronic combined systolic/diastolic CHF  - Appears dry on admission  - Echo last month, post-CABG, with EF 62-22%, grade 3 diastolic dysfunction, akinesis in LAD distribution  - Treated with 750 cc NS in ED  - SLIV, follow daily wts and I/O's, hold diuretics initially, continue ARB and digoxin, beta-blocker precluded by bradycardia   4. Type II DM  - A1c was 6.3% last month  - Managed at home with pioglitazone and metformin, held on admission   - Follow CBG's and start SSI with Novolog     DVT prophylaxis: sq heparin Code Status: Full  Family Communication: Wife updated at bedside Disposition Plan: Observe on med-surg Consults called: None Admission status: Observation    Vianne Bulls, MD [facility] Pager [phone]  If 7PM-7AM, please contact night-coverage [email or web address] Password [facility]  05/10/2017, 9:54 PM

## 2017-05-10 NOTE — ED Triage Notes (Signed)
Pt c/o bil testicular pain onset last night, pt c/o bil lower abd pain, pt denies n/v/d, pt hx of CABG 12/18, pt pale  In triage, pt appears weak, A&O x4

## 2017-05-10 NOTE — ED Notes (Signed)
Gave paint pain medication to then get patient comfortable enough to be able to sit at side of bed to give urine sample.  Patient agreeable to plan of care

## 2017-05-10 NOTE — ED Provider Notes (Addendum)
[facility] DEPART[facility] Provider Note   CSN: [id number] Arrival date & time: 05/10/17  1523     History   Chief Complaint Chief Complaint  Patient presents with  . Testicle Pain    HPI [name] [name] is a 72 y.o. male.  Level 5 caveat for urgent need for intervention.  Bilateral testicular pain for one month, getting worse today.  Status post CABG on 04/08/17.  Questionable low-grade fever.  He feels weak with low energy.  He was seen by his cardiologist on Wednesday and was "dehydrated".   No dysuria, hematuria, chest pain, dyspnea.  Severity of symptoms is moderate.  Nothing makes symptoms better or worse.      Past Medical History:  Diagnosis Date  . Bladder cancer ([place])   . Borderline glaucoma   . Diabetes mellitus type 2, controlled ([place]) [facility]  . Essential hypertension     Patient Active Problem List   Diagnosis Date Noted  . 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 ST elevation myocardial infarction (STEMI) of anterior wall ([place]) 03/31/2017  . STEMI (ST elevation myocardial infarction) ([place]) 03/31/2017  . Essential hypertension   . Diabetes mellitus type 2, controlled ([place])     Past Surgical History:  Procedure Laterality Date  . CAROTID DUPLEX SCAN  07-26-2010   BILATERAL ICA  STENOSIS 1% - 39%  . CORONARY ARTERY BYPASS GRAFT N/A 04/08/2017   Procedure: CORONARY ARTERY BYPASS GRAFTING (CABG) TIMES TWO USING LEFT INTERNAL MAMMARY ARTERY AND LEFT SAPHENOUS LEG VEIN HARVESTED ENDOSCOPICALLY.  LEG VEIN ALSO HARVESTED FROM THE RIGHT LEG;  Surgeon: [name], MD;  Location: [facility];  Service: Open Heart Surgery;  Laterality: N/A;  . CYSTOSCOPY WITH BIOPSY N/A 08/01/2012   Procedure: CYSTOSCOPY WITH BIOPSY BLADDER BIOPSY   ;  Surgeon: Fredricka Bonine, MD;  [facility];  Service: Urology;  Laterality: N/A;  . FULGURATION OF BLADDER TUMOR N/A 08/01/2012   Procedure: FULGURATION OF BLADDER TUMOR;  Surgeon: Fredricka Bonine, MD;  Location: [facility];  Service: Urology;  Laterality: N/A;  . LEFT HEART CATH AND CORONARY ANGIOGRAPHY N/A 03/31/2017   Procedure: LEFT HEART CATH AND CORONARY ANGIOGRAPHY;  Surgeon: Leonie Man, MD;  Location: [facility];  Service: Cardiovascular;  Laterality: N/A;  . RIGHT HEART CATH Right 03/31/2017   Procedure: RIGHT HEART CATH;  Surgeon: Leonie Man, MD;  Location: [facility];  Service: Cardiovascular;  Laterality: Right;  . TEE WITHOUT CARDIOVERSION N/A 04/08/2017   Procedure: TRANSESOPHAGEAL ECHOCARDIOGRAM (TEE);  Surgeon: [name], MD;  Location: [facility];  Service: Open Heart Surgery;  Laterality: N/A;  . TRANSURETHRAL RESECTION OF BLADDER TUMOR  05/29/2011   Procedure: TRANSURETHRAL RESECTION OF BLADDER TUMOR (TURBT);  Surgeon: Fredricka Bonine, MD;  Location: [facility];  Service: Urology;  Laterality: N/A;  . VENTRICULAR ASSIST DEVICE INSERTION Right 03/31/2017   Procedure: VENTRICULAR ASSIST DEVICE INSERTION;  Surgeon: Leonie Man, MD;  Location: [facility];  Service: Cardiovascular;  Laterality: Right;       Home Medications    Prior to Admission medications   Medication Sig Start Date End Date Taking? Authorizing Provider  amiodarone (PACERONE) 200 MG tablet Take 1 tablet (200 mg total) by mouth 2 (two) times daily. Patient taking differently: Take 200 mg by mouth daily.  04/18/17  Yes Gold, Wilder Glade, PA-C  aspirin EC 325 MG EC tablet Take  1 tablet (325 mg total) by mouth daily. 04/18/17  Yes Gold, Wayne E, PA-C  atorvastatin (LIPITOR) 80 MG tablet Take 1 tablet (80 mg total) by mouth at bedtime. 04/18/17  Yes Gold, Patrick Jupiter E, PA-C  digoxin (LANOXIN) 0.125 MG tablet Take 0.5 tablets (0.0625 mg total) by mouth daily. 05/10/17  Yes Shirley Friar, PA-C  losartan (COZAAR) 25 MG tablet Take 0.5 tablets (12.5 mg total) by mouth 2 (two) times daily. 05/03/17  Yes Shirley Friar, PA-C  mometasone (NASONEX) 50 MCG/ACT nasal spray Place 1-2 sprays into the nose daily as needed for allergies. 12/31/16  Yes [provider]  oxyCODONE (OXY IR/ROXICODONE) 5 MG immediate release tablet Take 1 tablet (5 mg total) by mouth every 6 (six) hours as needed for severe pain. Patient taking differently: Take 5 mg by mouth at bedtime.  04/18/17  Yes Gold, Wayne E, PA-C  pioglitazone-metformin (ACTOPLUS MET) 15-500 MG per tablet Take 1 tablet by mouth daily.    Yes [provider]  spironolactone (ALDACTONE) 25 MG tablet Take 1 tablet (25 mg total) by mouth daily. 04/18/17  Yes Gold, Wayne E, PA-C  furosemide (LASIX) 80 MG tablet Take 0.5 tablets (40 mg total) by mouth daily. May also take 0.5 tablets (40 mg total) at bedtime as needed. Patient not taking: No sig reported 04/26/17   Shirley Friar, PA-C    Family History Family History  Problem Relation Age of Onset  . Heart attack Sister   . Heart disease Sister     Social History Social History   Tobacco Use  . Smoking status: Former Smoker    Packs/day: 1.00    Years: 40.00    Pack years: 40.00    Types: Cigarettes    Last attempt to quit: 03/31/2017    Years since quitting: 0.1  . Smokeless tobacco: Never Used  Substance Use Topics  . Alcohol use: No  . Drug use: No     Allergies   Patient has no known allergies.   Review of Systems Review of Systems  All other systems reviewed and are negative.    Physical Exam Updated Vital Signs BP 126/63   Pulse 72   Temp (!) 97.5 F (36.4 C) (Oral)   Resp 18   Ht _0  (1.88 m)   Wt 82.6 kg (182 lb)   SpO2 97%   BMI 23.37 kg/m   Physical Exam  Constitutional: He is oriented to person, place, and time. He appears well-developed and well-nourished.  HENT:  Head: Normocephalic and atraumatic.  Eyes: Conjunctivae are normal.  Neck: Neck supple.  Cardiovascular: Normal rate and regular rhythm.  Pulmonary/Chest: Effort normal  and breath sounds normal.  Abdominal: Soft. Bowel sounds are normal.  Genitourinary:  Genitourinary Comments: Genitourinary exam: Penis appears normal.  Bilateral testicles are tender to palpation.   Musculoskeletal: Normal range of motion.  Neurological: He is alert and oriented to person, place, and time.  Skin: Skin is warm and dry.  Psychiatric: He has a normal mood and affect. His behavior is normal.  Nursing note and vitals reviewed.    ED Treatments / Results  Labs (all labs ordered are listed, but only abnormal results are displayed) Labs Reviewed  CBC WITH DIFFERENTIAL/PLATELET - Abnormal; Notable for the following components:      Result Value   WBC 22.7 (*)    Neutro Abs 18.6 (*)    Monocytes Absolute 1.4 (*)    All other components within normal  limits  COMPREHENSIVE METABOLIC PANEL - Abnormal; Notable for the following components:   Sodium 132 (*)    Chloride 97 (*)    CO2 21 (*)    Glucose, Bld 109 (*)    BUN 21 (*)    Creatinine, Ser 1.52 (*)    Alkaline Phosphatase 127 (*)    GFR calc non Af Amer 44 (*)    GFR calc Af Amer 51 (*)    All other components within normal limits  URINALYSIS, ROUTINE W REFLEX MICROSCOPIC    EKG  EKG Interpretation  Date/Time:  Friday May 10 2017 15:28:08 EST Ventricular Rate:  53 PR Interval:  170 QRS Duration: 104 QT Interval:  478 QTC Calculation: 448 R Axis:   115 Text Interpretation:  Sinus bradycardia Possible Left atrial enlargement Septal infarct , age undetermined Lateral infarct , age undetermined ST & T wave abnormality, consider inferior ischemia ST & T wave abnormality, consider anterior ischemia Abnormal ECG Confirmed by Nat Christen [phone]) on 05/10/2017 6:44:30 PM       Radiology US Scrotum  Result Date: 05/10/2017 CLINICAL DATA:  Bilateral testicular pain since last night. EXAM: SCROTAL ULTRASOUND DOPPLER ULTRASOUND OF THE TESTICLES TECHNIQUE: Complete ultrasound examination of the testicles,  epididymis, and other scrotal structures was performed. Color and spectral Doppler ultrasound were also utilized to evaluate blood flow to the testicles. COMPARISON:  None. FINDINGS: Right testicle Measurements: 4 x 3.1 x 3 cm. No microlithiasis visualized. There is increased vascularity of the right testicle. Left testicle Measurements: 2.9 x 2.2 x 1.8 cm. There is heterogeneous echotexture of the left testicle. Right epididymis: Heterogeneous echotexture with increased vascularity. There are right epididymal cysts. Left epididymis: Heterogeneous echotexture with increased vascularity. Hydrocele:  A right hydrocele is identified. Varicocele:  None visualized. Pulsed Doppler interrogation of both testes demonstrates normal low resistance arterial and venous waveforms bilaterally. IMPRESSION: Right epididymal orchitis. Left epididymitis. The left testicle is small without increased vascularity. Electronically Signed   By: Abelardo Diesel M.D.   On: 05/10/2017 17:11   [place] Scrotom Doppler  Result Date: 05/10/2017 CLINICAL DATA:  Bilateral testicular pain since last night. EXAM: SCROTAL ULTRASOUND DOPPLER ULTRASOUND OF THE TESTICLES TECHNIQUE: Complete ultrasound examination of the testicles, epididymis, and other scrotal structures was performed. Color and spectral Doppler ultrasound were also utilized to evaluate blood flow to the testicles. COMPARISON:  None. FINDINGS: Right testicle Measurements: 4 x 3.1 x 3 cm. No microlithiasis visualized. There is increased vascularity of the right testicle. Left testicle Measurements: 2.9 x 2.2 x 1.8 cm. There is heterogeneous echotexture of the left testicle. Right epididymis: Heterogeneous echotexture with increased vascularity. There are right epididymal cysts. Left epididymis: Heterogeneous echotexture with increased vascularity. Hydrocele:  A right hydrocele is identified. Varicocele:  None visualized. Pulsed Doppler interrogation of both testes demonstrates normal low  resistance arterial and venous waveforms bilaterally. IMPRESSION: Right epididymal orchitis. Left epididymitis. The left testicle is small without increased vascularity. Electronically Signed   By: Abelardo Diesel M.D.   On: 05/10/2017 17:11    Procedures Procedures (including critical care time)  Medications Ordered in ED Medications  0.9 %  sodium chloride infusion (not administered)  oxyCODONE-acetaminophen (PERCOCET/ROXICET) 5-325 MG per tablet 1 tablet (1 tablet Oral Given 05/10/17 1553)  0.9 %  sodium chloride infusion ( Intravenous Stopped 05/10/17 2027)  levofloxacin (LEVAQUIN) IVPB 750 mg (750 mg Intravenous New Bag/Given 05/10/17 1856)  ondansetron (ZOFRAN) injection 4 mg (4 mg Intravenous Given 05/10/17 1856)  morphine 4 MG/ML injection  4 mg (4 mg Intravenous Given 05/10/17 1856)  morphine 4 MG/ML injection 4 mg (4 mg Intravenous Given 05/10/17 2031)     Initial Impression / Assessment and Plan / ED Course  I have reviewed the triage vital signs and the nursing notes.  Pertinent labs & imaging results that were available during my care of the patient were reviewed by me and considered in my medical decision making (see chart for details).     Patient presents with bilateral testicular tenderness.  White count 22,000.  Ultrasound reveals right sided orchitis and left-sided epididymitis.  Will Rx IV Levaquin 750 mg.  Urinalysis pending.  Admit to general medicine.  Final Clinical Impressions(s) / ED Diagnoses   Final diagnoses:  Orchitis  Orchitis, right  Left epididymitis    ED Discharge Orders    None       Nat Christen, MD 05/10/17 2133    Nat Christen, MD 05/11/17 [phone]

## 2017-05-10 NOTE — ED Notes (Signed)
Per wife pt saw Bensimon, Md was x 2 days ago and the pt was told he was dehydrated to increase sodium intake

## 2017-05-10 NOTE — Telephone Encounter (Signed)
-----   Message from Shirley Friar, PA-C sent at 05/09/2017  9:23 AM EST ----- Meds adjusted at visit yesterday.   Hold digoxin 2 days and then decreased to 0.0625 mg daily.  Will repeat level at follow up.     Legrand Como [address] [place], PA-C 05/09/2017 9:22 AM

## 2017-05-10 NOTE — ED Notes (Signed)
Pt reminded that a urine sample is needed; pt verbalized understanding 

## 2017-05-10 NOTE — ED Notes (Signed)
Pt given water to drink per Dr. Lacinda Axon

## 2017-05-11 ENCOUNTER — Observation Stay (HOSPITAL_COMMUNITY): Payer: Medicare Other

## 2017-05-11 ENCOUNTER — Other Ambulatory Visit: Payer: Self-pay

## 2017-05-11 ENCOUNTER — Encounter (HOSPITAL_COMMUNITY): Payer: Self-pay | Admitting: Radiology

## 2017-05-11 DIAGNOSIS — N183 Chronic kidney disease, stage 3 (moderate): Secondary | ICD-10-CM | POA: Diagnosis present

## 2017-05-11 DIAGNOSIS — I252 Old myocardial infarction: Secondary | ICD-10-CM | POA: Diagnosis not present

## 2017-05-11 DIAGNOSIS — I5042 Chronic combined systolic (congestive) and diastolic (congestive) heart failure: Secondary | ICD-10-CM | POA: Diagnosis present

## 2017-05-11 DIAGNOSIS — Z79899 Other long term (current) drug therapy: Secondary | ICD-10-CM | POA: Diagnosis not present

## 2017-05-11 DIAGNOSIS — Z7982 Long term (current) use of aspirin: Secondary | ICD-10-CM | POA: Diagnosis not present

## 2017-05-11 DIAGNOSIS — Z8249 Family history of ischemic heart disease and other diseases of the circulatory system: Secondary | ICD-10-CM | POA: Diagnosis not present

## 2017-05-11 DIAGNOSIS — N451 Epididymitis: Secondary | ICD-10-CM | POA: Diagnosis not present

## 2017-05-11 DIAGNOSIS — E1122 Type 2 diabetes mellitus with diabetic chronic kidney disease: Secondary | ICD-10-CM | POA: Diagnosis present

## 2017-05-11 DIAGNOSIS — Z87891 Personal history of nicotine dependence: Secondary | ICD-10-CM | POA: Diagnosis not present

## 2017-05-11 DIAGNOSIS — N452 Orchitis: Secondary | ICD-10-CM | POA: Diagnosis present

## 2017-05-11 DIAGNOSIS — I130 Hypertensive heart and chronic kidney disease with heart failure and stage 1 through stage 4 chronic kidney disease, or unspecified chronic kidney disease: Secondary | ICD-10-CM | POA: Diagnosis present

## 2017-05-11 DIAGNOSIS — Z951 Presence of aortocoronary bypass graft: Secondary | ICD-10-CM | POA: Diagnosis not present

## 2017-05-11 DIAGNOSIS — I480 Paroxysmal atrial fibrillation: Secondary | ICD-10-CM | POA: Diagnosis present

## 2017-05-11 DIAGNOSIS — N453 Epididymo-orchitis: Secondary | ICD-10-CM | POA: Diagnosis present

## 2017-05-11 DIAGNOSIS — E871 Hypo-osmolality and hyponatremia: Secondary | ICD-10-CM | POA: Diagnosis present

## 2017-05-11 DIAGNOSIS — I2510 Atherosclerotic heart disease of native coronary artery without angina pectoris: Secondary | ICD-10-CM | POA: Diagnosis present

## 2017-05-11 LAB — URINALYSIS, ROUTINE W REFLEX MICROSCOPIC
BILIRUBIN URINE: NEGATIVE
GLUCOSE, UA: NEGATIVE mg/dL
Ketones, ur: NEGATIVE mg/dL
NITRITE: NEGATIVE
PROTEIN: 30 mg/dL — AB
Specific Gravity, Urine: 1.019 (ref 1.005–1.030)
pH: 5 (ref 5.0–8.0)

## 2017-05-11 LAB — BASIC METABOLIC PANEL
Anion gap: 10 (ref 5–15)
BUN: 20 mg/dL (ref 6–20)
CALCIUM: 8.2 mg/dL — AB (ref 8.9–10.3)
CO2: 20 mmol/L — ABNORMAL LOW (ref 22–32)
CREATININE: 1.51 mg/dL — AB (ref 0.61–1.24)
Chloride: 97 mmol/L — ABNORMAL LOW (ref 101–111)
GFR calc Af Amer: 52 mL/min — ABNORMAL LOW (ref >60)
GFR calc non Af Amer: 45 mL/min — ABNORMAL LOW (ref >60)
GLUCOSE: 109 mg/dL — AB (ref 65–99)
Potassium: 4.5 mmol/L (ref 3.5–5.1)
SODIUM: 127 mmol/L — AB (ref 135–145)

## 2017-05-11 LAB — GLUCOSE, CAPILLARY
GLUCOSE-CAPILLARY: 118 mg/dL — AB (ref 65–99)
GLUCOSE-CAPILLARY: 155 mg/dL — AB (ref 65–99)
Glucose-Capillary: 144 mg/dL — ABNORMAL HIGH (ref 65–99)
Glucose-Capillary: 113 mg/dL — ABNORMAL HIGH (ref 65–99)

## 2017-05-11 LAB — CBC
HCT: 38.4 % — ABNORMAL LOW (ref 39.0–52.0)
Hemoglobin: 12.3 g/dL — ABNORMAL LOW (ref 13.0–17.0)
MCH: 28.6 pg (ref 26.0–34.0)
MCHC: 32 g/dL (ref 30.0–36.0)
MCV: 89.3 fL (ref 78.0–100.0)
PLATELETS: 354 10*3/uL (ref 150–400)
RBC: 4.3 MIL/uL (ref 4.22–5.81)
RDW: 15.2 % (ref 11.5–15.5)
WBC: 41.6 10*3/uL — AB (ref 4.0–10.5)

## 2017-05-11 MED ORDER — IOPAMIDOL (ISOVUE-300) INJECTION 61%
INTRAVENOUS | Status: AC
  Administered 2017-05-11: 100 mL
  Filled 2017-05-11: qty 100

## 2017-05-11 MED ORDER — PIPERACILLIN-TAZOBACTAM 3.375 G IVPB
3.3750 g | Freq: Three times a day (TID) | INTRAVENOUS | Status: DC
  Administered 2017-05-11 – 2017-05-13 (×6): 3.375 g via INTRAVENOUS
  Filled 2017-05-11 (×8): qty 50

## 2017-05-11 NOTE — Progress Notes (Signed)
Back from Radiology via wheelchair. Alert, oriented, no  Signs of distress

## 2017-05-11 NOTE — Progress Notes (Signed)
Received call from radiology for Ct scan abdomen and pelvis today.

## 2017-05-11 NOTE — Progress Notes (Signed)
PROGRESS NOTE    [name] [name]  EYC:[id number] DOB: [birth year] DOA: 05/10/2017 PCP: Christain Sacramento, MD     Brief Narrative:  [name] [name] is a 72 y.o. male with medical history significant for type 2 diabetes mellitus, hypertension, and coronary artery disease status post CABG last month, now presenting to the emergency department for evaluation of testicular pain.  Patient suffered a STEMI last month, underwent CABG, and has been recovering until the development of acute bilateral testicular pain that started morning of admission. He admitted to swelling and pain, but no penile discharge or fevers. In the ED, [place] was completed which revealed bilateral epididymitis. He was started on empiric levaquin.   Assessment & Plan:   Principal Problem:   Acute epididymitis Active Problems:   Essential hypertension   Diabetes mellitus type 2, controlled (H[facility])   CAD (coronary artery disease)   Chronic combined systolic and diastolic CHF (congestive heart failure) ([facility])   CKD (chronic kidney disease), stage III ([facility])  Acute bilateral epididymitis  - Presents with acute bilateral testicular pain, found to have marked leukocytosis and ultrasound-findings consistent with bilateral epididymitis  - Check chlamydia and gonorrhea, urine culture pending  - WBC worsening today. CT pelvis obtained which showed no significant findings. Change to IV zosyn today until WBC improves   CAD  - Suffered STEMI last month and underwent CABG  - Continue ASA, Lipitor, losartan - Stable, no CP   Chronic combined systolic/diastolic CHF  - Echo last month, post-CABG, with EF 14-97%, grade 3 diastolic dysfunction, akinesis in LAD distribution  - Continue ARB and digoxin, beta-blocker precluded by bradycardia - Stable volume status   Paroxysmal A Fib - Continue amiodarone. NSR today   Type II DM  - A1c was 6.3% last month  - Managed at home with pioglitazone and metformin, held on admission   - SSI with  Novolog - Blood sugar stable   Chronic hyponatremia - Appears to have had Na 127-132 in the past month - Stable      CKD stage 3 - Baseline Cr 1.5 last admission - Stable     DVT prophylaxis: subq hep Code Status: full Family Communication: no family at bedside Disposition Plan: pending improvement   Consultants:   none  Procedures:   none  Antimicrobials:  Anti-infectives (From admission, onward)   Start     Dose/Rate Route Frequency Ordered Stop   05/11/17 1830  levofloxacin (LEVAQUIN) IVPB 750 mg  Status:  Discontinued     750 mg 100 mL/hr over 90 Minutes Intravenous Every 24 hours 05/10/17 2153 05/10/17 2153   05/11/17 1800  levofloxacin (LEVAQUIN) IVPB 500 mg  Status:  Discontinued     500 mg 100 mL/hr over 60 Minutes Intravenous Every 24 hours 05/10/17 2153 05/11/17 0823   05/11/17 0900  piperacillin-tazobactam (ZOSYN) IVPB 3.375 g     3.375 g 12.5 mL/hr over 240 Minutes Intravenous Every 8 hours 05/11/17 0823     05/10/17 1845  levofloxacin (LEVAQUIN) IVPB 750 mg     750 mg 100 mL/hr over 90 Minutes Intravenous  Once 05/10/17 1842 05/10/17 2026       Subjective: Doing well this morning. Continues to have pain in his testicles, R>L, swelling is about the same. No CP SOB N/V. Had trouble urinating yesterday and needed in and out cath.    Objective: Vitals:   05/10/17 2314 05/11/17 0459 05/11/17 0518 05/11/17 1258  BP: (!) 150/58 (!) 135/52  (!) 117/49  Pulse:  76 81  75  Resp: 18 18  16   Temp: 98.7 F (37.1 C) 100 F (37.8 C) 98.1 F (36.7 C) 98.4 F (36.9 C)  TempSrc: Oral Oral  Oral  SpO2: 98% 94%  95%  Weight: 81.3 kg (179 lb 3.7 oz) 81.3 kg (179 lb 3.7 oz)    Height: 6\' 2"  (1.88 m)       Intake/Output Summary (Last 24 hours) at 05/11/2017 1430 Last data filed at 05/11/2017 1300 Gross per 24 hour  Intake 1382 ml  Output 800 ml  Net 582 ml   Filed Weights   05/10/17 1530 05/10/17 2314 05/11/17 0459  Weight: 82.6 kg (182 lb) 81.3 kg (179  lb 3.7 oz) 81.3 kg (179 lb 3.7 oz)    Examination:  General exam: Appears calm and comfortable  Respiratory system: Clear to auscultation. Respiratory effort normal. Cardiovascular system: S1 & S2 heard, RRR. No JVD, murmurs, rubs, gallops or clicks. No pedal edema. Gastrointestinal system: Abdomen is nondistended, soft and nontender. No organomegaly or masses felt. Normal bowel sounds heard. GU: bilateral scrotum erythematous, right testicle is more swollen than left, pain with palpation, worse on the right posterior-laterally, no crepitus, no penile drainage  Central nervous system: Alert and oriented. No focal neurological deficits. Extremities: Symmetric 5 x 5 power. Psychiatry: Judgement and insight appear normal. Mood & affect appropriate.   Data Reviewed: I have personally reviewed following labs and imaging studies  CBC: Recent Labs  Lab 05/08/17 1541 05/10/17 1542 05/11/17 0440  WBC 10.3 22.7* 41.6*  NEUTROABS  --  18.6*  --   HGB 13.5 13.6 12.3*  HCT 42.4 42.4 38.4*  MCV 90.6 90.6 89.3  PLT 390 382 979   Basic Metabolic Panel: Recent Labs  Lab 05/08/17 1541 05/10/17 1542 05/11/17 0440  NA 132* 132* 127*  K 4.4 4.4 4.5  CL 96* 97* 97*  CO2 25 21* 20*  GLUCOSE 101* 109* 109*  BUN 27* 21* 20  CREATININE 1.62* 1.52* 1.51*  CALCIUM 9.0 9.3 8.2*   GFR: Estimated Creatinine Clearance: 51.6 mL/min (A) (by C-G formula based on SCr of 1.51 mg/dL (H)). Liver Function Tests: Recent Labs  Lab 05/10/17 1542  AST 32  ALT 22  ALKPHOS 127*  BILITOT 0.8  PROT 7.0  ALBUMIN 3.6   No results for input(s): LIPASE, AMYLASE in the last 168 hours. No results for input(s): AMMONIA in the last 168 hours. Coagulation Profile: No results for input(s): INR, PROTIME in the last 168 hours. Cardiac Enzymes: No results for input(s): CKTOTAL, CKMB, CKMBINDEX, TROPONINI in the last 168 hours. BNP (last 3 results) No results for input(s): PROBNP in the last 8760 hours. HbA1C: No  results for input(s): HGBA1C in the last 72 hours. CBG: Recent Labs  Lab 05/10/17 2318 05/11/17 0759 05/11/17 1232  GLUCAP 129* 118* 144*   Lipid Profile: No results for input(s): CHOL, HDL, LDLCALC, TRIG, CHOLHDL, LDLDIRECT in the last 72 hours. Thyroid Function Tests: No results for input(s): TSH, T4TOTAL, FREET4, T3FREE, THYROIDAB in the last 72 hours. Anemia Panel: No results for input(s): VITAMINB12, FOLATE, FERRITIN, TIBC, IRON, RETICCTPCT in the last 72 hours. Sepsis Labs: No results for input(s): PROCALCITON, LATICACIDVEN in the last 168 hours.  No results found for this or any previous visit (from the past 240 hour(s)).     Radiology Studies: Ct Pelvis W Contrast  Result Date: 05/11/2017 CLINICAL DATA:  Epididymo-orchitis. EXAM: CT PELVIS WITH CONTRAST TECHNIQUE: Multidetector CT imaging of the pelvis was performed  using the standard protocol following the bolus administration of intravenous contrast. CONTRAST:  118mL ISOVUE-300 IOPAMIDOL (ISOVUE-300) INJECTION 61% COMPARISON:  Scrotal ultrasound on 05/10/2017 FINDINGS: Urinary Tract: The bladder is mildly distended and unremarkable in appearance. Bowel: Pelvic bowel loops are unremarkable and show no evidence of obstruction or inflammation. There is diverticulosis of the sigmoid colon without evidence of diverticulitis. Vascular/Lymphatic: No vascular abnormalities in the pelvis other than atherosclerosis of the iliac arteries without aneurysm. No lymphadenopathy identified. Reproductive: The prostate gland appears unremarkable. No focal pelvic abscess or scrotal abscess identified by CT. Other:  None. Musculoskeletal: No suspicious bone lesions identified. IMPRESSION: No significant findings by CT. See prior scrotal ultrasound findings. No focal abscess identified. Electronically Signed   By: Aletta Edouard M.D.   On: 05/11/2017 10:32   US Scrotum  Result Date: 05/10/2017 CLINICAL DATA:  Bilateral testicular pain since last  night. EXAM: SCROTAL ULTRASOUND DOPPLER ULTRASOUND OF THE TESTICLES TECHNIQUE: Complete ultrasound examination of the testicles, epididymis, and other scrotal structures was performed. Color and spectral Doppler ultrasound were also utilized to evaluate blood flow to the testicles. COMPARISON:  None. FINDINGS: Right testicle Measurements: 4 x 3.1 x 3 cm. No microlithiasis visualized. There is increased vascularity of the right testicle. Left testicle Measurements: 2.9 x 2.2 x 1.8 cm. There is heterogeneous echotexture of the left testicle. Right epididymis: Heterogeneous echotexture with increased vascularity. There are right epididymal cysts. Left epididymis: Heterogeneous echotexture with increased vascularity. Hydrocele:  A right hydrocele is identified. Varicocele:  None visualized. Pulsed Doppler interrogation of both testes demonstrates normal low resistance arterial and venous waveforms bilaterally. IMPRESSION: Right epididymal orchitis. Left epididymitis. The left testicle is small without increased vascularity. Electronically Signed   By: Abelardo Diesel M.D.   On: 05/10/2017 17:11   [place] Scrotom Doppler  Result Date: 05/10/2017 CLINICAL DATA:  Bilateral testicular pain since last night. EXAM: SCROTAL ULTRASOUND DOPPLER ULTRASOUND OF THE TESTICLES TECHNIQUE: Complete ultrasound examination of the testicles, epididymis, and other scrotal structures was performed. Color and spectral Doppler ultrasound were also utilized to evaluate blood flow to the testicles. COMPARISON:  None. FINDINGS: Right testicle Measurements: 4 x 3.1 x 3 cm. No microlithiasis visualized. There is increased vascularity of the right testicle. Left testicle Measurements: 2.9 x 2.2 x 1.8 cm. There is heterogeneous echotexture of the left testicle. Right epididymis: Heterogeneous echotexture with increased vascularity. There are right epididymal cysts. Left epididymis: Heterogeneous echotexture with increased vascularity. Hydrocele:  A  right hydrocele is identified. Varicocele:  None visualized. Pulsed Doppler interrogation of both testes demonstrates normal low resistance arterial and venous waveforms bilaterally. IMPRESSION: Right epididymal orchitis. Left epididymitis. The left testicle is small without increased vascularity. Electronically Signed   By: Abelardo Diesel M.D.   On: 05/10/2017 17:11      Scheduled Meds: . amiodarone  200 mg Oral Daily  . aspirin  325 mg Oral Daily  . atorvastatin  80 mg Oral q1800  . digoxin  0.0625 mg Oral Daily  . heparin  5,000 Units Subcutaneous Q8H  . insulin aspart  0-5 Units Subcutaneous QHS  . insulin aspart  0-9 Units Subcutaneous TID WC  . losartan  12.5 mg Oral BID   Continuous Infusions: . piperacillin-tazobactam (ZOSYN)  IV 3.375 g (05/11/17 1130)     LOS: 0 days    Time spent: 40 minutes   Dessa Phi, DO [facility] [email or web address] Password [email or web address] 05/11/2017, 2:30 PM

## 2017-05-12 LAB — CBC
HEMATOCRIT: 34 % — AB (ref 39.0–52.0)
Hemoglobin: 10.7 g/dL — ABNORMAL LOW (ref 13.0–17.0)
MCH: 28.4 pg (ref 26.0–34.0)
MCHC: 31.5 g/dL (ref 30.0–36.0)
MCV: 90.2 fL (ref 78.0–100.0)
PLATELETS: 301 10*3/uL (ref 150–400)
RBC: 3.77 MIL/uL — ABNORMAL LOW (ref 4.22–5.81)
RDW: 15.6 % — AB (ref 11.5–15.5)
WBC: 29.7 10*3/uL — AB (ref 4.0–10.5)

## 2017-05-12 LAB — BASIC METABOLIC PANEL
ANION GAP: 12 (ref 5–15)
BUN: 25 mg/dL — ABNORMAL HIGH (ref 6–20)
CALCIUM: 8.3 mg/dL — AB (ref 8.9–10.3)
CO2: 22 mmol/L (ref 22–32)
CREATININE: 1.71 mg/dL — AB (ref 0.61–1.24)
Chloride: 98 mmol/L — ABNORMAL LOW (ref 101–111)
GFR, EST AFRICAN AMERICAN: 45 mL/min — AB (ref >60)
GFR, EST NON AFRICAN AMERICAN: 38 mL/min — AB (ref >60)
Glucose, Bld: 95 mg/dL (ref 65–99)
Potassium: 3.8 mmol/L (ref 3.5–5.1)
SODIUM: 132 mmol/L — AB (ref 135–145)

## 2017-05-12 LAB — DIGOXIN LEVEL: DIGOXIN LVL: 1.1 ng/mL (ref 0.8–2.0)

## 2017-05-12 LAB — GLUCOSE, CAPILLARY
GLUCOSE-CAPILLARY: 124 mg/dL — AB (ref 65–99)
GLUCOSE-CAPILLARY: 132 mg/dL — AB (ref 65–99)
Glucose-Capillary: 102 mg/dL — ABNORMAL HIGH (ref 65–99)
Glucose-Capillary: 141 mg/dL — ABNORMAL HIGH (ref 65–99)

## 2017-05-12 LAB — URINE CULTURE: Culture: NO GROWTH

## 2017-05-12 NOTE — Progress Notes (Signed)
PROGRESS NOTE    [name] REIERSON  UXL:[id number] DOB: June 17, 1945 DOA: 05/10/2017 PCP: Christain Sacramento, MD     Brief Narrative:  [name] [name] is a 72 y.o. male with medical history significant for type 2 diabetes mellitus, hypertension, and coronary artery disease status post CABG last month, now presenting to the emergency department for evaluation of testicular pain.  Patient suffered a STEMI last month, underwent CABG, and has been recovering until the development of acute bilateral testicular pain that started morning of admission. He admitted to swelling and pain, but no penile discharge or fevers. In the ED, [place] was completed which revealed bilateral epididymitis. He was started on empiric levaquin.   Assessment & Plan:   Principal Problem:   Acute epididymitis Active Problems:   Essential hypertension   Diabetes mellitus type 2, controlled (HCC)   CAD (coronary artery disease)   Chronic combined systolic and diastolic CHF (congestive heart failure) (HCC)   CKD (chronic kidney disease), stage III (HCC)  Acute bilateral epididymitis  - Presents with acute bilateral testicular pain, found to have marked leukocytosis and ultrasound-findings consistent with bilateral epididymitis  - CT pelvis obtained which showed no significant findings - Check chlamydia and gonorrhea -not collected yet - Urine culture negative  - WBC improving. Continue zosyn. As long as continues to improve, will plan to deescalate antibiotics in AM and DC home   CAD  - Suffered STEMI last month and underwent CABG  - Continue ASA, Lipitor, losartan - Stable, no CP   Chronic combined systolic/diastolic CHF  - Echo last month, post-CABG, with EF 53-66%, grade 3 diastolic dysfunction, akinesis in LAD distribution  - Continue ARB and digoxin, beta-blocker precluded by bradycardia - Stable volume status  - Check digoxin level. His dose was decreased by cardiology recently.   Paroxysmal A Fib - Continue  amiodarone. NSR today   Type II DM  - A1c was 6.3% last month  - Managed at home with pioglitazone and metformin, held on admission   - SSI with Novolog - Blood sugar stable   Chronic hyponatremia - Appears to have had Na 127-132 in the past month - Stable      CKD stage 3 - Baseline Cr 1.5 last admission - Stable     DVT prophylaxis: subq hep Code Status: full Family Communication: wife at bedside Disposition Plan: pending improvement   Consultants:   none  Procedures:   none  Antimicrobials:  Anti-infectives (From admission, onward)   Start     Dose/Rate Route Frequency Ordered Stop   05/11/17 1830  levofloxacin (LEVAQUIN) IVPB 750 mg  Status:  Discontinued     750 mg 100 mL/hr over 90 Minutes Intravenous Every 24 hours 05/10/17 2153 05/10/17 2153   05/11/17 1800  levofloxacin (LEVAQUIN) IVPB 500 mg  Status:  Discontinued     500 mg 100 mL/hr over 60 Minutes Intravenous Every 24 hours 05/10/17 2153 05/11/17 0823   05/11/17 0900  piperacillin-tazobactam (ZOSYN) IVPB 3.375 g     3.375 g 12.5 mL/hr over 240 Minutes Intravenous Every 8 hours 05/11/17 0823     05/10/17 1845  levofloxacin (LEVAQUIN) IVPB 750 mg     750 mg 100 mL/hr over 90 Minutes Intravenous  Once 05/10/17 1842 05/10/17 2026       Subjective: Doing well this morning. Continues to have sensitivity in his testicles, R>L, swelling has improved. No CP SOB N/V. No trouble with urination today. No fevers.   Objective: Vitals:  05/11/17 0518 05/11/17 1258 05/11/17 2055 05/12/17 0443  BP:  (!) 117/49 (!) 101/47 (!) 108/48  Pulse:  75 82 73  Resp:  16 16 16   Temp: 98.1 F (36.7 C) 98.4 F (36.9 C) 98.2 F (36.8 C) 98 F (36.7 C)  TempSrc:  Oral Oral Oral  SpO2:  95% 95% 94%  Weight:    80.4 kg (177 lb 4 oz)  Height:        Intake/Output Summary (Last 24 hours) at 05/12/2017 1037 Last data filed at 05/12/2017 0835 Gross per 24 hour  Intake 600 ml  Output 650 ml  Net -50 ml   Filed  Weights   05/10/17 2314 05/11/17 0459 05/12/17 0443  Weight: 81.3 kg (179 lb 3.7 oz) 81.3 kg (179 lb 3.7 oz) 80.4 kg (177 lb 4 oz)    Examination:  General exam: Appears calm and comfortable  Respiratory system: Clear to auscultation. Respiratory effort normal. Cardiovascular system: S1 & S2 heard, RRR. No JVD, murmurs, rubs, gallops or clicks. No pedal edema. Gastrointestinal system: Abdomen is nondistended, soft and nontender. No organomegaly or masses felt. Normal bowel sounds heard. GU: bilateral scrotum erythematous but less so than yesterday, right testicle is more swollen than left, pain with palpation, worse on the right posterior-laterally, no crepitus, no penile drainage  Central nervous system: Alert and oriented. No focal neurological deficits. Extremities: Symmetric 5 x 5 power. Psychiatry: Judgement and insight appear normal. Mood & affect appropriate.   Data Reviewed: I have personally reviewed following labs and imaging studies  CBC: Recent Labs  Lab 05/08/17 1541 05/10/17 1542 05/11/17 0440 05/12/17 0446  WBC 10.3 22.7* 41.6* 29.7*  NEUTROABS  --  18.6*  --   --   HGB 13.5 13.6 12.3* 10.7*  HCT 42.4 42.4 38.4* 34.0*  MCV 90.6 90.6 89.3 90.2  PLT 390 382 354 378   Basic Metabolic Panel: Recent Labs  Lab 05/08/17 1541 05/10/17 1542 05/11/17 0440 05/12/17 0446  NA 132* 132* 127* 132*  K 4.4 4.4 4.5 3.8  CL 96* 97* 97* 98*  CO2 25 21* 20* 22  GLUCOSE 101* 109* 109* 95  BUN 27* 21* 20 25*  CREATININE 1.62* 1.52* 1.51* 1.71*  CALCIUM 9.0 9.3 8.2* 8.3*   GFR: Estimated Creatinine Clearance: 45.1 mL/min (A) (by C-G formula based on SCr of 1.71 mg/dL (H)). Liver Function Tests: Recent Labs  Lab 05/10/17 1542  AST 32  ALT 22  ALKPHOS 127*  BILITOT 0.8  PROT 7.0  ALBUMIN 3.6   No results for input(s): LIPASE, AMYLASE in the last 168 hours. No results for input(s): AMMONIA in the last 168 hours. Coagulation Profile: No results for input(s): INR,  PROTIME in the last 168 hours. Cardiac Enzymes: No results for input(s): CKTOTAL, CKMB, CKMBINDEX, TROPONINI in the last 168 hours. BNP (last 3 results) No results for input(s): PROBNP in the last 8760 hours. HbA1C: No results for input(s): HGBA1C in the last 72 hours. CBG: Recent Labs  Lab 05/11/17 0759 05/11/17 1232 05/11/17 1655 05/11/17 2157 05/12/17 0759  GLUCAP 118* 144* 155* 113* 102*   Lipid Profile: No results for input(s): CHOL, HDL, LDLCALC, TRIG, CHOLHDL, LDLDIRECT in the last 72 hours. Thyroid Function Tests: No results for input(s): TSH, T4TOTAL, FREET4, T3FREE, THYROIDAB in the last 72 hours. Anemia Panel: No results for input(s): VITAMINB12, FOLATE, FERRITIN, TIBC, IRON, RETICCTPCT in the last 72 hours. Sepsis Labs: No results for input(s): PROCALCITON, LATICACIDVEN in the last 168 hours.  Recent Results (  from the past 240 hour(s))  Urine culture     Status: None   Collection Time: 05/10/17  5:42 AM  Result Value Ref Range Status   Specimen Description URINE, RANDOM  Final   Special Requests NONE  Final   Culture NO GROWTH  Final   Report Status 05/12/2017 FINAL  Final       Radiology Studies: Ct Pelvis W Contrast  Result Date: 05/11/2017 CLINICAL DATA:  Epididymo-orchitis. EXAM: CT PELVIS WITH CONTRAST TECHNIQUE: Multidetector CT imaging of the pelvis was performed using the standard protocol following the bolus administration of intravenous contrast. CONTRAST:  138mL ISOVUE-300 IOPAMIDOL (ISOVUE-300) INJECTION 61% COMPARISON:  Scrotal ultrasound on 05/10/2017 FINDINGS: Urinary Tract: The bladder is mildly distended and unremarkable in appearance. Bowel: Pelvic bowel loops are unremarkable and show no evidence of obstruction or inflammation. There is diverticulosis of the sigmoid colon without evidence of diverticulitis. Vascular/Lymphatic: No vascular abnormalities in the pelvis other than atherosclerosis of the iliac arteries without aneurysm. No  lymphadenopathy identified. Reproductive: The prostate gland appears unremarkable. No focal pelvic abscess or scrotal abscess identified by CT. Other:  None. Musculoskeletal: No suspicious bone lesions identified. IMPRESSION: No significant findings by CT. See prior scrotal ultrasound findings. No focal abscess identified. Electronically Signed   By: Aletta Edouard M.D.   On: 05/11/2017 10:32   US Scrotum  Result Date: 05/10/2017 CLINICAL DATA:  Bilateral testicular pain since last night. EXAM: SCROTAL ULTRASOUND DOPPLER ULTRASOUND OF THE TESTICLES TECHNIQUE: Complete ultrasound examination of the testicles, epididymis, and other scrotal structures was performed. Color and spectral Doppler ultrasound were also utilized to evaluate blood flow to the testicles. COMPARISON:  None. FINDINGS: Right testicle Measurements: 4 x 3.1 x 3 cm. No microlithiasis visualized. There is increased vascularity of the right testicle. Left testicle Measurements: 2.9 x 2.2 x 1.8 cm. There is heterogeneous echotexture of the left testicle. Right epididymis: Heterogeneous echotexture with increased vascularity. There are right epididymal cysts. Left epididymis: Heterogeneous echotexture with increased vascularity. Hydrocele:  A right hydrocele is identified. Varicocele:  None visualized. Pulsed Doppler interrogation of both testes demonstrates normal low resistance arterial and venous waveforms bilaterally. IMPRESSION: Right epididymal orchitis. Left epididymitis. The left testicle is small without increased vascularity. Electronically Signed   By: Abelardo Diesel M.D.   On: 05/10/2017 17:11   [place] Scrotom Doppler  Result Date: 05/10/2017 CLINICAL DATA:  Bilateral testicular pain since last night. EXAM: SCROTAL ULTRASOUND DOPPLER ULTRASOUND OF THE TESTICLES TECHNIQUE: Complete ultrasound examination of the testicles, epididymis, and other scrotal structures was performed. Color and spectral Doppler ultrasound were also utilized to  evaluate blood flow to the testicles. COMPARISON:  None. FINDINGS: Right testicle Measurements: 4 x 3.1 x 3 cm. No microlithiasis visualized. There is increased vascularity of the right testicle. Left testicle Measurements: 2.9 x 2.2 x 1.8 cm. There is heterogeneous echotexture of the left testicle. Right epididymis: Heterogeneous echotexture with increased vascularity. There are right epididymal cysts. Left epididymis: Heterogeneous echotexture with increased vascularity. Hydrocele:  A right hydrocele is identified. Varicocele:  None visualized. Pulsed Doppler interrogation of both testes demonstrates normal low resistance arterial and venous waveforms bilaterally. IMPRESSION: Right epididymal orchitis. Left epididymitis. The left testicle is small without increased vascularity. Electronically Signed   By: Abelardo Diesel M.D.   On: 05/10/2017 17:11      Scheduled Meds: . amiodarone  200 mg Oral Daily  . aspirin  325 mg Oral Daily  . atorvastatin  80 mg Oral q1800  . digoxin  0.0625 mg Oral Daily  . heparin  5,000 Units Subcutaneous Q8H  . insulin aspart  0-5 Units Subcutaneous QHS  . insulin aspart  0-9 Units Subcutaneous TID WC  . losartan  12.5 mg Oral BID   Continuous Infusions: . piperacillin-tazobactam (ZOSYN)  IV Stopped (05/12/17 0914)     LOS: 1 day    Time spent: 30 minutes   Dessa Phi, DO [facility] [email or web address] Password [facility] 05/12/2017, 10:37 AM

## 2017-05-13 LAB — GLUCOSE, CAPILLARY: Glucose-Capillary: 113 mg/dL — ABNORMAL HIGH (ref 65–99)

## 2017-05-13 LAB — BASIC METABOLIC PANEL
ANION GAP: 9 (ref 5–15)
BUN: 21 mg/dL — ABNORMAL HIGH (ref 6–20)
CALCIUM: 8.4 mg/dL — AB (ref 8.9–10.3)
CHLORIDE: 100 mmol/L — AB (ref 101–111)
CO2: 23 mmol/L (ref 22–32)
Creatinine, Ser: 1.43 mg/dL — ABNORMAL HIGH (ref 0.61–1.24)
GFR calc non Af Amer: 48 mL/min — ABNORMAL LOW (ref >60)
GFR, EST AFRICAN AMERICAN: 55 mL/min — AB (ref >60)
Glucose, Bld: 134 mg/dL — ABNORMAL HIGH (ref 65–99)
POTASSIUM: 4.1 mmol/L (ref 3.5–5.1)
Sodium: 132 mmol/L — ABNORMAL LOW (ref 135–145)

## 2017-05-13 LAB — CBC
HEMATOCRIT: 33.8 % — AB (ref 39.0–52.0)
HEMOGLOBIN: 10.8 g/dL — AB (ref 13.0–17.0)
MCH: 28.5 pg (ref 26.0–34.0)
MCHC: 32 g/dL (ref 30.0–36.0)
MCV: 89.2 fL (ref 78.0–100.0)
Platelets: 292 10*3/uL (ref 150–400)
RBC: 3.79 MIL/uL — AB (ref 4.22–5.81)
RDW: 15.4 % (ref 11.5–15.5)
WBC: 14.1 10*3/uL — AB (ref 4.0–10.5)

## 2017-05-13 LAB — GC/CHLAMYDIA PROBE AMP (~~LOC~~) NOT AT ARMC
CHLAMYDIA, DNA PROBE: NEGATIVE
Neisseria Gonorrhea: NEGATIVE

## 2017-05-13 MED ORDER — AMIODARONE HCL 200 MG PO TABS: 200.0000 mg | ORAL_TABLET | Freq: Every day | ORAL | 0 refills | Status: DC

## 2017-05-13 MED ORDER — LEVOFLOXACIN 500 MG PO TABS: 500.0000 mg | ORAL_TABLET | Freq: Every day | ORAL | 0 refills | Status: AC

## 2017-05-13 MED ORDER — DIGOXIN 125 MCG PO TABS: 0.0625 mg | ORAL_TABLET | Freq: Every day | ORAL | 0 refills | Status: DC

## 2017-05-13 MED ORDER — LOSARTAN POTASSIUM 25 MG PO TABS: 12.5000 mg | ORAL_TABLET | Freq: Every day | ORAL | 0 refills | Status: DC

## 2017-05-13 NOTE — Discharge Instructions (Signed)
Epididymitis Epididymitis is swelling (inflammation) of the epididymis. The epididymis is a cord-like structure that is located along the top and back part of the testicle. It collects and stores sperm from the testicle. This condition can also cause pain and swelling of the testicle and scrotum. Symptoms usually start suddenly (acute epididymitis). Sometimes epididymitis starts gradually and lasts for a while (chronic epididymitis). This type may be harder to treat. What are the causes? In men 35 and younger, this condition is usually caused by a bacterial infection or sexually transmitted disease (STD), such as:  Gonorrhea.  Chlamydia.  In men 35 and older who do not have anal sex, this condition is usually caused by bacteria from a blockage or abnormalities in the urinary system. These can result from:  Having a tube placed into the bladder (urinary catheter).  Having an enlarged or inflamed prostate gland.  Having recent urinary tract surgery.  In men who have a condition that weakens the body's defense system (immune system), such as HIV, this condition can be caused by:  Other bacteria, including tuberculosis and syphilis.  Viruses.  Fungi.  Sometimes this condition occurs without infection. That may happen if urine flows backward into the epididymis after heavy lifting or straining. What increases the risk? This condition is more likely to develop in men:  Who have unprotected sex with more than one partner.  Who have anal sex.  Who have recently had surgery.  Who have a urinary catheter.  Who have urinary problems.  Who have a suppressed immune system.  What are the signs or symptoms? This condition usually begins suddenly with chills, fever, and pain behind the scrotum and in the testicle. Other symptoms include:  Swelling of the scrotum, testicle, or both.  Pain whenejaculatingor urinating.  Pain in the back or belly.  Nausea.  Itching and discharge  from the penis.  Frequent need to pass urine.  Redness and tenderness of the scrotum.  How is this diagnosed? Your health care provider can diagnose this condition based on your symptoms and medical history. Your health care provider will also do a physical exam to ask about your symptoms and check your scrotum and testicle for swelling, pain, and redness. You may also have other tests, including:  Examination of discharge from the penis.  Urine tests for infections, such as STDs.  Your health care provider may test you for other STDs, including HIV. How is this treated? Treatment for this condition depends on the cause. If your condition is caused by a bacterial infection, oral antibiotic medicine may be prescribed. If the bacterial infection has spread to your blood, you may need to receive IV antibiotics. Nonbacterial epididymitis is treated with home care that includes bed rest and elevation of the scrotum. Surgery may be needed to treat:  Bacterial epididymitis that causes pus to build up in the scrotum (abscess).  Chronic epididymitis that has not responded to other treatments.  Follow these instructions at home: Medicines  Take over-the-counter and prescription medicines only as told by your health care provider.  If you were prescribed an antibiotic medicine, take it as told by your health care provider. Do not stop taking the antibiotic even if your condition improves. Sexual Activity  If your epididymitis was caused by an STD, avoid sexual activity until your treatment is complete.  Inform your sexual partner or partners if you test positive for an STD. They may need to be treated.Do not engage in sexual activity with your partner or   partners until their treatment is completed. General instructions  Return to your normal activities as told by your health care provider. Ask your health care provider what activities are safe for you.  Keep your scrotum elevated and  supported while resting. Ask your health care provider if you should wear a scrotal support, such as a jockstrap. Wear it as told by your health care provider.  If directed, apply ice to the affected area: ? Put ice in a plastic bag. ? Place a towel between your skin and the bag. ? Leave the ice on for 20 minutes, 2-3 times per day.  Try taking a sitz bath to help with discomfort. This is a warm water bath that is taken while you are sitting down. The water should only come up to your hips and should cover your buttocks. Do this 3-4 times per day or as told by your health care provider.  Keep all follow-up visits as told by your health care provider. This is important. Contact a health care provider if:  You have a fever.  Your pain medicine is not helping.  Your pain is getting worse.  Your symptoms do not improve within three days. This information is not intended to replace advice given to you by your health care provider. Make sure you discuss any questions you have with your health care provider. Document Released: 04/13/2000 Document Revised: 09/22/2015 Document Reviewed: 09/01/2014 Elsevier Interactive Patient Education  2018 [employer]  

## 2017-05-13 NOTE — Progress Notes (Signed)
Pt ambulated to the hall. Feeling a lot better today. Discharge instructions given to pt and wife. Discharged home accompanied by wife.

## 2017-05-13 NOTE — Discharge Summary (Signed)
Physician Discharge Summary  [name] [name] HSJ:[id number] DOB: Aug 03, 1945 DOA: 05/10/2017  PCP: Christain Sacramento, MD  Admit date: 05/10/2017 Discharge date: 05/13/2017  Admitted From: Home Disposition:  Home  Recommendations for Outpatient Follow-up:  1. Follow up with PCP in 1 week 2. Follow up with Cardiology as scheduled next week 3. Please obtain BMP/CBC in 1 week to ensure resolution of leukocytosis, stability kidney function  4. Please follow up on the following pending results: chlamydia/gonorrhea pending at time of discharge  Discharge Condition: Stable CODE STATUS: Full  Diet recommendation: Heart healthy   Brief/Interim Summary: [name] Mclees Padgettis a 72 y.o.malewith medical history significant fortype 2 diabetes mellitus, hypertension, and coronary artery disease status post CABG last month, now presenting to the emergency department for evaluation of testicular pain. Patient suffered a STEMI last month, underwent CABG, and has been recovering until the development of acute bilateral testicular pain that started morning of admission. He admitted to swelling and pain, but no penile discharge or fevers. In the ED, [place] was completed which revealed bilateral epididymitis. He was started on empiric levaquin. Due to worsening leukocytosis, CT pelvis was completed which was unremarkable. Antibiotics were changed to IV zosyn. He had clinical improvement and is discharged home on oral levaquin to complete total 10 day course.   Discharge Diagnoses:  Principal Problem:   Acute epididymitis Active Problems:   Essential hypertension   Diabetes mellitus type 2, controlled (HCC)   CAD (coronary artery disease)   Chronic combined systolic and diastolic CHF (congestive heart failure) (HCC)   CKD (chronic kidney disease), stage III (HCC)  Acute bilateral epididymitis -Presents with acute bilateral testicular pain, found to have marked leukocytosis and ultrasound-findings consistent with  bilateral epididymitis - CT pelvis obtained which showed no significant findings -Chlamydia and gonorrhea -pending at time of discharge - Urine culture negative  - WBC improving. Will deescalate to oral levaquin on discharge to complete total 10 day course   CAD -Suffered STEMI last month and underwent CABG -Continue ASA, Lipitor, losartan - Stable, no CP   Chronic combined systolic/diastolic CHF -Echo last month, post-CABG, with EF 99-69%, grade 3 diastolic dysfunction, akinesis in LAD distribution -Continue ARB and digoxin, beta-blocker precluded by bradycardia - Stable volume status  - Checked digoxin level which is within normal limits. No change in dose on discharge   Paroxysmal A Fib - Continue amiodarone. NSR today   Type II DM -A1c was 6.3% last month -Managed at home with pioglitazone and metformin, held on admission - Blood sugar stable   Chronic hyponatremia - Appears to have had Na 127-132 in the past month - Stable    CKD stage 3 - Baseline Cr 1.5 last admission - Stable    Discharge Instructions  Discharge Instructions    Call MD for:  difficulty breathing, headache or visual disturbances   Complete by:  As directed    Call MD for:  extreme fatigue   Complete by:  As directed    Call MD for:  persistant dizziness or light-headedness   Complete by:  As directed    Call MD for:  persistant nausea and vomiting   Complete by:  As directed    Call MD for:  severe uncontrolled pain   Complete by:  As directed    Call MD for:  temperature >100.4   Complete by:  As directed    Diet - low sodium heart healthy   Complete by:  As directed    Discharge  instructions   Complete by:  As directed    You were cared for by a hospitalist during your hospital stay. If you have any questions about your discharge medications or the care you received while you were in the hospital after you are discharged, you can call the unit and asked to speak  with the hospitalist on call if the hospitalist that took care of you is not available. Once you are discharged, your primary care physician will handle any further medical issues. Please note that NO REFILLS for any discharge medications will be authorized once you are discharged, as it is imperative that you return to your primary care physician (or establish a relationship with a primary care physician if you do not have one) for your aftercare needs so that they can reassess your need for medications and monitor your lab values.   Increase activity slowly   Complete by:  As directed      Allergies as of 05/13/2017   No Known Allergies     Medication List    STOP taking these medications   furosemide 80 MG tablet Commonly known as:  LASIX     TAKE these medications   amiodarone 200 MG tablet Commonly known as:  PACERONE Take 1 tablet (200 mg total) by mouth daily.   aspirin 325 MG EC tablet Take 1 tablet (325 mg total) by mouth daily.   atorvastatin 80 MG tablet Commonly known as:  LIPITOR Take 1 tablet (80 mg total) by mouth at bedtime.   digoxin 0.125 MG tablet Commonly known as:  LANOXIN Take 0.5 tablets (0.0625 mg total) by mouth daily. What changed:  how much to take   levofloxacin 500 MG tablet Commonly known as:  LEVAQUIN Take 1 tablet (500 mg total) by mouth daily for 6 days.   losartan 25 MG tablet Commonly known as:  COZAAR Take 0.5 tablets (12.5 mg total) by mouth daily.   mometasone 50 MCG/ACT nasal spray Commonly known as:  NASONEX Place 1-2 sprays into the nose daily as needed for allergies.   oxyCODONE 5 MG immediate release tablet Commonly known as:  Oxy IR/ROXICODONE Take 1 tablet (5 mg total) by mouth every 6 (six) hours as needed for severe pain. What changed:  when to take this   pioglitazone-metformin 15-500 MG tablet Commonly known as:  ACTOPLUS MET Take 1 tablet by mouth daily.   spironolactone 25 MG tablet Commonly known as:   ALDACTONE Take 1 tablet (25 mg total) by mouth daily.      Follow-up Information    Christain Sacramento, MD. Schedule an appointment as soon as possible for a visit in 1 week(s).   Specialty:  Family Medicine Contact information: [address] [place] [id number] [phone]          No Known Allergies  Consultations:  None   Procedures/Studies: Ct Pelvis W Contrast  Result Date: 05/11/2017 CLINICAL DATA:  Epididymo-orchitis. EXAM: CT PELVIS WITH CONTRAST TECHNIQUE: Multidetector CT imaging of the pelvis was performed using the standard protocol following the bolus administration of intravenous contrast. CONTRAST:  120m ISOVUE-300 IOPAMIDOL (ISOVUE-300) INJECTION 61% COMPARISON:  Scrotal ultrasound on 05/10/2017 FINDINGS: Urinary Tract: The bladder is mildly distended and unremarkable in appearance. Bowel: Pelvic bowel loops are unremarkable and show no evidence of obstruction or inflammation. There is diverticulosis of the sigmoid colon without evidence of diverticulitis. Vascular/Lymphatic: No vascular abnormalities in the pelvis other than atherosclerosis of the iliac arteries without aneurysm. No lymphadenopathy identified.  Reproductive: The prostate gland appears unremarkable. No focal pelvic abscess or scrotal abscess identified by CT. Other:  None. Musculoskeletal: No suspicious bone lesions identified. IMPRESSION: No significant findings by CT. See prior scrotal ultrasound findings. No focal abscess identified. Electronically Signed   By: Aletta Edouard M.D.   On: 05/11/2017 10:32   US Scrotum  Result Date: 05/10/2017 CLINICAL DATA:  Bilateral testicular pain since last night. EXAM: SCROTAL ULTRASOUND DOPPLER ULTRASOUND OF THE TESTICLES TECHNIQUE: Complete ultrasound examination of the testicles, epididymis, and other scrotal structures was performed. Color and spectral Doppler ultrasound were also utilized to evaluate blood flow to the testicles. COMPARISON:  None.  FINDINGS: Right testicle Measurements: 4 x 3.1 x 3 cm. No microlithiasis visualized. There is increased vascularity of the right testicle. Left testicle Measurements: 2.9 x 2.2 x 1.8 cm. There is heterogeneous echotexture of the left testicle. Right epididymis: Heterogeneous echotexture with increased vascularity. There are right epididymal cysts. Left epididymis: Heterogeneous echotexture with increased vascularity. Hydrocele:  A right hydrocele is identified. Varicocele:  None visualized. Pulsed Doppler interrogation of both testes demonstrates normal low resistance arterial and venous waveforms bilaterally. IMPRESSION: Right epididymal orchitis. Left epididymitis. The left testicle is small without increased vascularity. Electronically Signed   By: Abelardo Diesel M.D.   On: 05/10/2017 17:11   Dg Chest Port 1 View  Result Date: 04/14/2017 CLINICAL DATA:  72 year old male postoperative day 6 status post CABG. EXAM: PORTABLE CHEST 1 VIEW COMPARISON:  04/12/2017 and earlier. FINDINGS: Portable AP semi upright view at 0425 hours. Right PICC line remains. Stable cardiac size and mediastinal contours. Sequelae of CABG. Stable lung volumes. No pneumothorax. Small left pleural effusion is stable. No consolidation. Stable pulmonary vascularity with superimposed suspected chronic increased interstitial opacity. No areas of worsening ventilation. IMPRESSION: Stable ventilation. Small left pleural effusion. Suspected chronic bilateral pulmonary interstitial changes with no overt edema. Electronically Signed   By: Genevie Ann M.D.   On: 04/14/2017 07:12   [place] Scrotom Doppler  Result Date: 05/10/2017 CLINICAL DATA:  Bilateral testicular pain since last night. EXAM: SCROTAL ULTRASOUND DOPPLER ULTRASOUND OF THE TESTICLES TECHNIQUE: Complete ultrasound examination of the testicles, epididymis, and other scrotal structures was performed. Color and spectral Doppler ultrasound were also utilized to evaluate blood flow to the  testicles. COMPARISON:  None. FINDINGS: Right testicle Measurements: 4 x 3.1 x 3 cm. No microlithiasis visualized. There is increased vascularity of the right testicle. Left testicle Measurements: 2.9 x 2.2 x 1.8 cm. There is heterogeneous echotexture of the left testicle. Right epididymis: Heterogeneous echotexture with increased vascularity. There are right epididymal cysts. Left epididymis: Heterogeneous echotexture with increased vascularity. Hydrocele:  A right hydrocele is identified. Varicocele:  None visualized. Pulsed Doppler interrogation of both testes demonstrates normal low resistance arterial and venous waveforms bilaterally. IMPRESSION: Right epididymal orchitis. Left epididymitis. The left testicle is small without increased vascularity. Electronically Signed   By: Abelardo Diesel M.D.   On: 05/10/2017 17:11       Discharge Exam: Vitals:   05/12/17 2116 05/13/17 0439  BP: (!) 112/48 (!) 115/49  Pulse: 77 75  Resp: 14 14  Temp: 98.7 F (37.1 C) 98.8 F (37.1 C)  SpO2: 97% 95%   Vitals:   05/12/17 0443 05/12/17 1412 05/12/17 2116 05/13/17 0439  BP: (!) 108/48 (!) 103/50 (!) 112/48 (!) 115/49  Pulse: 73 73 77 75  Resp: _0 Temp: 98 F (36.7 C) 98.2 F (36.8 C) 98.7 F (37.1 C) 98.8 F (  37.1 C)  TempSrc: Oral Oral Oral Oral  SpO2: 94% 97% 97% 95%  Weight: 80.4 kg (177 lb 4 oz)   84.1 kg (185 lb 6.5 oz)  Height:        General: Pt is alert, awake, not in acute distress Cardiovascular: RRR, S1/S2 +, no rubs, no gallops Respiratory: CTA bilaterally, no wheezing, no rhonchi Abdominal: Soft, NT, ND, bowel sounds + Extremities: no edema, no cyanosis    The results of significant diagnostics from this hospitalization (including imaging, microbiology, ancillary and laboratory) are listed below for reference.     Microbiology: Recent Results (from the past 240 hour(s))  Urine culture     Status: None   Collection Time: 05/10/17  5:42 AM  Result Value Ref Range  Status   Specimen Description URINE, RANDOM  Final   Special Requests NONE  Final   Culture NO GROWTH  Final   Report Status 05/12/2017 FINAL  Final     Labs: BNP (last 3 results) Recent Labs    03/31/17 2341  BNP 449.2*   Basic Metabolic Panel: Recent Labs  Lab 05/08/17 1541 05/10/17 1542 05/11/17 0440 05/12/17 0446 05/13/17 0522  NA 132* 132* 127* 132* 132*  K 4.4 4.4 4.5 3.8 4.1  CL 96* 97* 97* 98* 100*  CO2 25 21* 20* 22 23  GLUCOSE 101* 109* 109* 95 134*  BUN 27* 21* 20 25* 21*  CREATININE 1.62* 1.52* 1.51* 1.71* 1.43*  CALCIUM 9.0 9.3 8.2* 8.3* 8.4*   Liver Function Tests: Recent Labs  Lab 05/10/17 1542  AST 32  ALT 22  ALKPHOS 127*  BILITOT 0.8  PROT 7.0  ALBUMIN 3.6   No results for input(s): LIPASE, AMYLASE in the last 168 hours. No results for input(s): AMMONIA in the last 168 hours. CBC: Recent Labs  Lab 05/08/17 1541 05/10/17 1542 05/11/17 0440 05/12/17 0446 05/13/17 0522  WBC 10.3 22.7* 41.6* 29.7* 14.1*  NEUTROABS  --  18.6*  --   --   --   HGB 13.5 13.6 12.3* 10.7* 10.8*  HCT 42.4 42.4 38.4* 34.0* 33.8*  MCV 90.6 90.6 89.3 90.2 89.2  PLT 390 382 354 301 292   Cardiac Enzymes: No results for input(s): CKTOTAL, CKMB, CKMBINDEX, TROPONINI in the last 168 hours. BNP: Invalid input(s): POCBNP CBG: Recent Labs  Lab 05/12/17 0759 05/12/17 1144 05/12/17 1649 05/12/17 2216 05/13/17 0751  GLUCAP 102* 124* 141* 132* 113*   D-Dimer No results for input(s): DDIMER in the last 72 hours. Hgb A1c No results for input(s): HGBA1C in the last 72 hours. Lipid Profile No results for input(s): CHOL, HDL, LDLCALC, TRIG, CHOLHDL, LDLDIRECT in the last 72 hours. Thyroid function studies No results for input(s): TSH, T4TOTAL, T3FREE, THYROIDAB in the last 72 hours.  Invalid input(s): FREET3 Anemia work up No results for input(s): VITAMINB12, FOLATE, FERRITIN, TIBC, IRON, RETICCTPCT in the last 72 hours. Urinalysis    Component Value  Date/Time   COLORURINE YELLOW 05/11/2017 0456   APPEARANCEUR CLEAR 05/11/2017 0456   LABSPEC 1.019 05/11/2017 0456   PHURINE 5.0 05/11/2017 0456   GLUCOSEU NEGATIVE 05/11/2017 0456   HGBUR SMALL (A) 05/11/2017 0456   BILIRUBINUR NEGATIVE 05/11/2017 0456   KETONESUR NEGATIVE 05/11/2017 0456   PROTEINUR 30 (A) 05/11/2017 0456   NITRITE NEGATIVE 05/11/2017 0456   LEUKOCYTESUR MODERATE (A) 05/11/2017 0456   Sepsis Labs Invalid input(s): PROCALCITONIN,  WBC,  LACTICIDVEN Microbiology Recent Results (from the past 240 hour(s))  Urine culture  Status: None   Collection Time: 05/10/17  5:42 AM  Result Value Ref Range Status   Specimen Description URINE, RANDOM  Final   Special Requests NONE  Final   Culture NO GROWTH  Final   Report Status 05/12/2017 FINAL  Final     Time coordinating discharge: 40 minutes  SIGNED:  Dessa Phi, DO [facility] Pager [phone]  If 7PM-7AM, please contact night-coverage [email or web address] Password [email or web address] 05/13/2017, 12:30 PM

## 2017-05-15 ENCOUNTER — Telehealth (HOSPITAL_COMMUNITY): Payer: Self-pay

## 2017-05-15 NOTE — Telephone Encounter (Signed)
Called to speak with patient in regards to cardiac rehab - patient is interested in the program although he currently is taking a 7 day antibiotic due to an infection in his testicles. He was in the hospital this past weekend due to the infection. He stated it has set him back. He is supposed to make follow up appt with his PCP after he is finished taking the antibiotic. Patient would like for me to follow up with him next Wednesday 05/22/2017.

## 2017-05-22 ENCOUNTER — Ambulatory Visit: Payer: Self-pay | Admitting: Surgery

## 2017-05-23 ENCOUNTER — Encounter (HOSPITAL_COMMUNITY): Payer: Self-pay

## 2017-05-23 ENCOUNTER — Ambulatory Visit (HOSPITAL_COMMUNITY)
Admission: RE | Admit: 2017-05-23 | Discharge: 2017-05-23 | Disposition: A | Discharge status: Home / Self Care | Payer: Medicare Other | Source: Ambulatory Visit | Attending: Internal Medicine | Admitting: Internal Medicine

## 2017-05-23 VITALS — BP 152/58 | HR 60 | Wt 181.6 lb

## 2017-05-23 DIAGNOSIS — E118 Type 2 diabetes mellitus with unspecified complications: Secondary | ICD-10-CM

## 2017-05-23 DIAGNOSIS — I10 Essential (primary) hypertension: Secondary | ICD-10-CM

## 2017-05-23 DIAGNOSIS — Z951 Presence of aortocoronary bypass graft: Secondary | ICD-10-CM | POA: Diagnosis not present

## 2017-05-23 DIAGNOSIS — Z79899 Other long term (current) drug therapy: Secondary | ICD-10-CM | POA: Diagnosis not present

## 2017-05-23 DIAGNOSIS — I2583 Coronary atherosclerosis due to lipid rich plaque: Secondary | ICD-10-CM | POA: Diagnosis not present

## 2017-05-23 DIAGNOSIS — I5042 Chronic combined systolic (congestive) and diastolic (congestive) heart failure: Secondary | ICD-10-CM

## 2017-05-23 DIAGNOSIS — I5022 Chronic systolic (congestive) heart failure: Secondary | ICD-10-CM | POA: Diagnosis present

## 2017-05-23 DIAGNOSIS — Z7984 Long term (current) use of oral hypoglycemic drugs: Secondary | ICD-10-CM | POA: Insufficient documentation

## 2017-05-23 DIAGNOSIS — I2510 Atherosclerotic heart disease of native coronary artery without angina pectoris: Secondary | ICD-10-CM | POA: Diagnosis not present

## 2017-05-23 DIAGNOSIS — R001 Bradycardia, unspecified: Secondary | ICD-10-CM | POA: Diagnosis not present

## 2017-05-23 DIAGNOSIS — Z7982 Long term (current) use of aspirin: Secondary | ICD-10-CM | POA: Insufficient documentation

## 2017-05-23 DIAGNOSIS — Z9889 Other specified postprocedural states: Secondary | ICD-10-CM | POA: Insufficient documentation

## 2017-05-23 DIAGNOSIS — I480 Paroxysmal atrial fibrillation: Secondary | ICD-10-CM | POA: Diagnosis not present

## 2017-05-23 DIAGNOSIS — E861 Hypovolemia: Secondary | ICD-10-CM | POA: Insufficient documentation

## 2017-05-23 DIAGNOSIS — I110 Hypertensive heart disease with heart failure: Secondary | ICD-10-CM | POA: Insufficient documentation

## 2017-05-23 DIAGNOSIS — Z87891 Personal history of nicotine dependence: Secondary | ICD-10-CM | POA: Diagnosis not present

## 2017-05-23 DIAGNOSIS — Z8551 Personal history of malignant neoplasm of bladder: Secondary | ICD-10-CM | POA: Diagnosis not present

## 2017-05-23 DIAGNOSIS — J441 Chronic obstructive pulmonary disease with (acute) exacerbation: Secondary | ICD-10-CM | POA: Insufficient documentation

## 2017-05-23 DIAGNOSIS — E119 Type 2 diabetes mellitus without complications: Secondary | ICD-10-CM | POA: Diagnosis not present

## 2017-05-23 DIAGNOSIS — I252 Old myocardial infarction: Secondary | ICD-10-CM | POA: Diagnosis not present

## 2017-05-23 DIAGNOSIS — Z8249 Family history of ischemic heart disease and other diseases of the circulatory system: Secondary | ICD-10-CM | POA: Diagnosis not present

## 2017-05-23 LAB — IRON AND TIBC
IRON: 24 ug/dL — AB (ref 45–182)
SATURATION RATIOS: 8 % — AB (ref 17.9–39.5)
TIBC: 288 ug/dL (ref 250–450)
UIBC: 264 ug/dL

## 2017-05-23 LAB — DIGOXIN LEVEL: Digoxin Level: 0.5 ng/mL — ABNORMAL LOW (ref 0.8–2.0)

## 2017-05-23 LAB — VITAMIN B12: Vitamin B-12: 468 pg/mL (ref 180–914)

## 2017-05-23 LAB — FERRITIN: Ferritin: 134 ng/mL (ref 24–336)

## 2017-05-23 LAB — FOLATE: FOLATE: 10.1 ng/mL (ref >5.9)

## 2017-05-23 MED ORDER — LOSARTAN POTASSIUM 25 MG PO TABS: 12.5000 mg | ORAL_TABLET | Freq: Two times a day (BID) | ORAL | 5 refills | Status: DC

## 2017-05-23 NOTE — Patient Instructions (Addendum)
INCREASE Losartan to 12,5 mg (one half tab) twice a day STOP Amiodarone   Your physician recommends that you schedule a follow-up appointment in: 4 weeks with Rebecca Eaton   Do the following things EVERYDAY: 1) Weigh yourself in the morning before breakfast. Write it down and keep it in a log. 2) Take your medicines as prescribed 3) Eat low salt foods-Limit salt (sodium) to 2000 mg per day.  4) Stay as active as you can everyday 5) Limit all fluids for the day to less than 2 liters

## 2017-05-23 NOTE — Progress Notes (Signed)
ReDS Vest - 05/23/17 1400      ReDS Vest   MR   No    Fitting Posture  Standing    Height Marker  Tall    Ruler Value  12    Center Strip  Aligned    ReDS Value  23

## 2017-05-23 NOTE — Progress Notes (Signed)
Advanced Heart Failure Clinic Note   Primary Cardiologist: Dr. Haroldine Laws   HPI:  [name] [name] is a 72 y.o. male with h/o DM2, COPD (quit smoking 12/18), severe 3v CAD s/p CABG 22/29/7989, chronic systolic HF due to ICM, EF 25-30% (echo 12/18),  HTN, DM2, h/o bladder CA s/p multiple rounds of BCG therapy, and paroxysmal AF.   Pt admitted in 12/18 with anterior STEMI and developed cardiogenic shock requiring Impella support. Urgent cath showed severe 3v CAD as below. While on impella, pt developed sepsis of unclear source with WBC ~ 40K, though did have pseudomonas on his UCx. Treated for AECOPD and completed ABX therapy. BCx negative. TCTS followed while on pump for CABG.  Pt stabilized and underwent CABG x 2 with LIMA to LAD and SVG to OM on 04/08/2017. Pt transiently required milrinone but weaned prior to discharge. Required amiodarone for brief post op afib, but had no recurrence.   Pt presents today for regular follow up. At last visit had significant hypotension secondary to hypovolemia. Lasix stopped and fluid intake liberalized.  Pt has since been seen in ED for Orchitis. This is improving on an ABX course. His weight at home has stabilized in the 181-185 range.  He still has mild orthopnea at times. He is getting his strength back, but has not yet started CR.  He has been taking his losartan once daily instead of BID, but otherwise taking all medications as directed. Not smoking. Denies palpitations.   Review of systems complete and found to be negative unless listed in HPI.    [facility] 03/31/2017  Prox RCA lesion is 100% stenosed. Dist RCA lesion is 100% stenosed (retrograde filling from collaterals does not go beyond the distal vessel) -->faint collateral filling to the distal PL and PDA from septal perforators and a diffusely diseased AV groove circumflex  LAV Groove lesion is 90% stenosed.  Prox LAD-1 lesion is 95% stenosed. Prox LAD-2 lesion is 85% stenosed. Heavily calcified,  tandem lesions in very tortuous segment of the vessel.  Prox Cx to Mid Cx lesion is 70% stenosed. Heavily calcified  There is severe left ventricular systolic dysfunction. The left ventricular ejection fraction is less than 25% by visual estimate.  LV end diastolic pressure is severely elevated -prior to Impella insertion  Hemodynamic findings consistent with moderate pulmonary hypertension.  Successful Impella insertion with 3.5-3.6 LPM flow's  Echo 04/01/2017 EF 15-20%  Echo 04/16/17 (post op) Improved to 25-30% on 04/16/17  Past Medical History:  Diagnosis Date  . Bladder cancer ([place])   . Borderline glaucoma   . Diabetes mellitus type 2, controlled ([place]) OR[facility] MED  . Essential hypertension     Current Outpatient Medications  Medication Sig Dispense Refill  . amiodarone (PACERONE) 200 MG tablet Take 1 tablet (200 mg total) by mouth daily. 30 tablet 0  . aspirin EC 325 MG EC tablet Take 1 tablet (325 mg total) by mouth daily.    [name] atorvastatin (LIPITOR) 80 MG tablet Take 1 tablet (80 mg total) by mouth at bedtime. 30 tablet 1  . digoxin (LANOXIN) 0.125 MG tablet Take 0.5 tablets (0.0625 mg total) by mouth daily. 30 tablet 0  . losartan (COZAAR) 25 MG tablet Take 0.5 tablets (12.5 mg total) by mouth daily. 30 tablet 0  . mometasone (NASONEX) 50 MCG/ACT nasal spray Place 1-2 sprays into the nose daily as needed for allergies.    [name] oxyCODONE (OXY IR/ROXICODONE) 5 MG immediate release tablet Take 1 tablet (5 mg total)  by mouth every 6 (six) hours as needed for severe pain. (Patient taking differently: Take 5 mg by mouth at bedtime. ) 30 tablet 0  . pioglitazone-metformin (ACTOPLUS MET) 15-500 MG per tablet Take 1 tablet by mouth daily.     [name] spironolactone (ALDACTONE) 25 MG tablet Take 1 tablet (25 mg total) by mouth daily. 30 tablet 1   No current facility-administered medications for this encounter.     No Known Allergies    Social History   Socioeconomic History  . Marital  status: Married    Spouse name: Not on file  . Number of children: Not on file  . Years of education: Not on file  . Highest education level: Not on file  Social Needs  . Financial resource strain: Not on file  . Food insecurity - worry: Not on file  . Food insecurity - inability: Not on file  . Transportation needs - medical: Not on file  . Transportation needs - non-medical: Not on file  Occupational History  . Not on file  Tobacco Use  . Smoking status: Former Smoker    Packs/day: 1.00    Years: 40.00    Pack years: 40.00    Types: Cigarettes    Last attempt to quit: 03/31/2017    Years since quitting: 0.1  . Smokeless tobacco: Never Used  Substance and Sexual Activity  . Alcohol use: No  . Drug use: No  . Sexual activity: Not on file  Other Topics Concern  . Not on file  Social History Narrative  . Not on file   Family History  Problem Relation Age of Onset  . Heart attack Sister   . Heart disease Sister     Vitals:   05/23/17 1339  BP: (!) 152/58  Pulse: 60  SpO2: 98%  Weight: 181 lb 9.6 oz (82.4 kg)   Wt Readings from Last 3 Encounters:  05/23/17 181 lb 9.6 oz (82.4 kg)  05/13/17 185 lb 6.5 oz (84.1 kg)  05/08/17 185 lb 6.4 oz (84.1 kg)    PHYSICAL EXAM: General: NAD.  HEENT: Normal Neck: Supple. JVP 6-7 cm. Carotids 2+ bilat; no bruits. No thyromegaly or nodule noted. Cor: PMI nondisplaced. Regular, brady. No M/G/R noted Lungs: Clear, very mild emphysematous sounds L base. Abdomen: Soft, non-tender, non-distended, no HSM. No bruits or masses. +BS  Extremities: No cyanosis, clubbing, or rash. Trace ankle edema.  Neuro: Alert & orientedx3, cranial nerves grossly intact. moves all 4 extremities w/o difficulty. Affect pleasant   ASSESSMENT & PLAN:  1. Chronic systolic CHF due to ICM  - EF 04/01/17 EF 15-20% -> Improved to 25-30% on 04/16/17 Post op CABG as below. Bedside echo by Dr. Haroldine Laws appears ~30% with HK apex. - NYHA III symptoms.  - Volume  status stable to dry on exam. ReDs vest 23. No lasix.  - No room for BB with bradycardia.  - Continue digoxin 0.0625. Check Level today.  - Continue spironolactone 25 mg daily. BMET drawn at PCP office today.  - Increase losartan to 12.5 mg BID.  - Reinforced fluid restriction to < 2 L daily, sodium restriction to less than 2000 mg daily, and the importance of daily weights.   - Referred to cardiac rehab.  Needs to start now that he is over infection.   2. CAD - s/p CABG with LIMA to LAD and SVG to OM on 04/08/2017 - No s/s of ischemia. - Continue ASA and statin.   3. Tobacco abuse: Smoked  1 ppd PTA for > 50 years.  - No longer smoking. Congratulated on success thus far.   4. Hx of bladder CA:  - s/p Multiple rounds of BCG therapy. Has been stable.   5. Post op Atrial fibrillation - Sinus brady by EKG.  - Stop amiodarone.   6. Bradycardia - EKG today with sinus brady 48 bpm.  - Stop amiodarone as above.  - Asymptomatic, but confounded by recent surgery and deconditioning. - Follow pulse at home. If remains < 50 may need to consider PPM.   7. HTN - Adjusting meds as above.   Meds and labs as above. EKG today with sinus brady. Discussed with Dr. Haroldine Laws. Stop amio. Can consider EP for PPM if worsens or becomes symptomatic.  RTC 4 weeks.  Shirley Friar, PA-C 05/23/17   Greater than 50% of the 25 minute visit was spent in counseling/coordination of care regarding disease state education, salt/fluid restriction, sliding scale diuretics, and medication compliance.

## 2017-05-23 NOTE — Progress Notes (Signed)
Research Encounter  Patient consented to IV iron study IRB # [id number]. Anemia panel drawn, quality of life survey completed, and 6-minute walk test administered. Follow-up on anemia panel and replete iron according to study protocol.   Kathi Dohn, PharmD Pharmacy Resident Pager: [phone]  

## 2017-05-28 ENCOUNTER — Other Ambulatory Visit (HOSPITAL_COMMUNITY): Payer: Self-pay

## 2017-05-28 ENCOUNTER — Other Ambulatory Visit: Payer: Self-pay | Admitting: Surgery

## 2017-05-28 DIAGNOSIS — D509 Iron deficiency anemia, unspecified: Secondary | ICD-10-CM

## 2017-05-28 DIAGNOSIS — Z951 Presence of aortocoronary bypass graft: Secondary | ICD-10-CM

## 2017-05-29 ENCOUNTER — Ambulatory Visit (INDEPENDENT_AMBULATORY_CARE_PROVIDER_SITE_OTHER): Payer: Self-pay | Admitting: Surgery

## 2017-05-29 ENCOUNTER — Encounter: Payer: Self-pay | Admitting: Surgery

## 2017-05-29 ENCOUNTER — Ambulatory Visit
Admission: RE | Admit: 2017-05-29 | Discharge: 2017-05-29 | Disposition: A | Discharge status: Home / Self Care | Payer: Medicare Other | Source: Ambulatory Visit | Attending: Surgery | Admitting: Surgery

## 2017-05-29 VITALS — BP 120/70 | HR 60 | Resp 20 | Ht 74.0 in | Wt 181.0 lb

## 2017-05-29 DIAGNOSIS — Z951 Presence of aortocoronary bypass graft: Secondary | ICD-10-CM

## 2017-05-29 NOTE — Progress Notes (Signed)
HPI: Patient returns for routine postoperative follow-up having undergone CABG x2 on 04/08/2017.  He was admitted with a STEMI with an ejection fraction of 25% and cardiogenic shock requiring insertion of an Impella for hemodynamic support.  He developed sepsis but gradually improved and became human apically stable with removal of the Impella. The patient's early postoperative recovery while in the hospital was notable for a fairly uneventful recovery.  He was transiently on milrinone but weaned quickly.  He has some brief postoperative atrial fibrillation and was started on amiodarone with conversion to sinus rhythm.. Since hospital discharge the patient reports that he has had a slow but progressive recovery.  He was readmitted briefly for treatment of orchitis and completed an antibiotic course at home.  He has been walking in his house with minimal shortness of breath.  He has mild orthopnea at times.  His appetite has not completely recovered.   Current Outpatient Medications  Medication Sig Dispense Refill  . aspirin EC 325 MG EC tablet Take 1 tablet (325 mg total) by mouth daily.    [name] atorvastatin (LIPITOR) 80 MG tablet Take 1 tablet (80 mg total) by mouth at bedtime. 30 tablet 1  . digoxin (LANOXIN) 0.125 MG tablet Take 0.5 tablets (0.0625 mg total) by mouth daily. 30 tablet 0  . losartan (COZAAR) 25 MG tablet Take 0.5 tablets (12.5 mg total) by mouth 2 (two) times daily. 30 tablet 5  . mometasone (NASONEX) 50 MCG/ACT nasal spray Place 1-2 sprays into the nose daily as needed for allergies.    [name] oxyCODONE (OXY IR/ROXICODONE) 5 MG immediate release tablet Take 1 tablet (5 mg total) by mouth every 6 (six) hours as needed for severe pain. (Patient taking differently: Take 5 mg by mouth at bedtime. ) 30 tablet 0  . pioglitazone-metformin (ACTOPLUS MET) 15-500 MG per tablet Take 1 tablet by mouth daily.     [name] spironolactone (ALDACTONE) 25 MG tablet Take 1 tablet (25 mg total) by mouth  daily. 30 tablet 1   No current facility-administered medications for this visit.     Physical Exam: BP 120/70   Pulse 60   Resp 20   Ht '6\' 2"'$  (1.88 m)   Wt 181 lb (82.1 kg)   SpO2 98% Comment: RA  BMI 23.24 kg/m  He looks well. Lung exam is clear. Cardiac exam shows a regular rate and rhythm with normal heart sounds. Chest incision is healing well and sternum is stable. The leg incisions are healing well and there is no peripheral edema.    Diagnostic Tests:  CLINICAL DATA:  Initial evaluation for intermittent shortness of breath. History of prior CABG.  EXAM: CHEST  2 VIEW  COMPARISON:  Prior radiograph from 04/14/2017.  FINDINGS: Median sternotomy wires with underlying suture material surgical clips, stable. Cardiomegaly is unchanged. Mediastinal silhouette normal. Aortic atherosclerosis.  Lungs normally inflated. No focal infiltrates. No pulmonary edema or pleural effusion. No pneumothorax.  No acute osseus abnormality.  IMPRESSION: 1. Stable cardiomegaly with sequelae of prior CABG. No pulmonary edema or other acute cardiopulmonary abnormality. 2. Aortic atherosclerosis.   Electronically Signed   By: Jeannine Boga M.D.   On: 05/29/2017 13:40   Impression: Overall I think he is doing well. I encouraged him to continue walking. He is planning to participate in cardiac rehab. I told him he could drive his car but should not lift anything heavier than 10 lbs for three months postop.   Plan:  He is  going to continue follow-up with his primary physician and with Dr. Haroldine Laws for continued management of his low ejection fraction and heart failure.   [name], MD [facility] [phone]

## 2017-05-30 ENCOUNTER — Telehealth (HOSPITAL_COMMUNITY): Payer: Self-pay

## 2017-05-30 NOTE — Telephone Encounter (Signed)
Wife of patient called to scheduled orientation - scheduled orientation on 07/11/2017 at 8:45am. Patient will attend the 1:15pm exc class.

## 2017-06-03 ENCOUNTER — Encounter (HOSPITAL_COMMUNITY): Payer: Medicare Other

## 2017-06-03 ENCOUNTER — Inpatient Hospital Stay (HOSPITAL_COMMUNITY): Admission: RE | Admit: 2017-06-03 | Payer: Medicare Other | Source: Ambulatory Visit

## 2017-06-04 ENCOUNTER — Ambulatory Visit (HOSPITAL_COMMUNITY)
Admission: RE | Admit: 2017-06-04 | Discharge: 2017-06-04 | Disposition: A | Discharge status: Home / Self Care | Payer: Medicare Other | Source: Ambulatory Visit | Attending: Internal Medicine | Admitting: Internal Medicine

## 2017-06-04 ENCOUNTER — Ambulatory Visit (HOSPITAL_COMMUNITY)
Admission: RE | Admit: 2017-06-04 | Discharge: 2017-06-04 | Disposition: A | Discharge status: Home / Self Care | Payer: Medicare Other | Source: Ambulatory Visit | Attending: Cardiology | Admitting: Cardiology

## 2017-06-04 DIAGNOSIS — D509 Iron deficiency anemia, unspecified: Secondary | ICD-10-CM | POA: Insufficient documentation

## 2017-06-04 LAB — CBC
HCT: 38.5 % — ABNORMAL LOW (ref 39.0–52.0)
HEMOGLOBIN: 12.2 g/dL — AB (ref 13.0–17.0)
MCH: 28.2 pg (ref 26.0–34.0)
MCHC: 31.7 g/dL (ref 30.0–36.0)
MCV: 88.9 fL (ref 78.0–100.0)
PLATELETS: 467 10*3/uL — AB (ref 150–400)
RBC: 4.33 MIL/uL (ref 4.22–5.81)
RDW: 15.9 % — ABNORMAL HIGH (ref 11.5–15.5)
WBC: 10.8 10*3/uL — AB (ref 4.0–10.5)

## 2017-06-04 LAB — IRON AND TIBC
IRON: 42 ug/dL — AB (ref 45–182)
SATURATION RATIOS: 13 % — AB (ref 17.9–39.5)
TIBC: 314 ug/dL (ref 250–450)
UIBC: 272 ug/dL

## 2017-06-04 LAB — FERRITIN: Ferritin: 83 ng/mL (ref 24–336)

## 2017-06-04 MED ORDER — SODIUM CHLORIDE 0.9 % IV SOLN
510.0000 mg | Freq: Once | INTRAVENOUS | Status: AC
  Administered 2017-06-04: 12:00:00 510 mg via INTRAVENOUS
  Filled 2017-06-04: qty 17

## 2017-06-04 NOTE — Discharge Instructions (Signed)

## 2017-06-05 ENCOUNTER — Other Ambulatory Visit (HOSPITAL_COMMUNITY): Payer: Self-pay

## 2017-06-05 DIAGNOSIS — D649 Anemia, unspecified: Secondary | ICD-10-CM

## 2017-06-05 MED ORDER — SODIUM CHLORIDE 0.9 % IV SOLN
510.0000 mg | Freq: Once | INTRAVENOUS | Status: DC
  Filled 2017-06-05: qty 17

## 2017-06-07 ENCOUNTER — Other Ambulatory Visit (HOSPITAL_COMMUNITY): Payer: Self-pay | Admitting: *Deleted

## 2017-06-10 ENCOUNTER — Other Ambulatory Visit (HOSPITAL_COMMUNITY): Payer: Self-pay | Admitting: *Deleted

## 2017-06-10 ENCOUNTER — Encounter (HOSPITAL_COMMUNITY)
Admission: RE | Admit: 2017-06-10 | Discharge: 2017-06-10 | Disposition: A | Discharge status: Home / Self Care | Payer: Medicare Other | Source: Ambulatory Visit | Attending: Internal Medicine | Admitting: Internal Medicine

## 2017-06-10 DIAGNOSIS — D649 Anemia, unspecified: Secondary | ICD-10-CM | POA: Insufficient documentation

## 2017-06-10 MED ORDER — SPIRONOLACTONE 25 MG PO TABS: 25.0000 mg | ORAL_TABLET | Freq: Every day | ORAL | 3 refills | Status: DC

## 2017-06-10 MED ORDER — SODIUM CHLORIDE 0.9 % IV SOLN
510.0000 mg | Freq: Once | INTRAVENOUS | Status: AC
  Administered 2017-06-10: 510 mg via INTRAVENOUS
  Filled 2017-06-10: qty 17

## 2017-06-19 ENCOUNTER — Other Ambulatory Visit (HOSPITAL_COMMUNITY): Payer: Self-pay

## 2017-06-19 DIAGNOSIS — D509 Iron deficiency anemia, unspecified: Secondary | ICD-10-CM

## 2017-06-20 ENCOUNTER — Ambulatory Visit (HOSPITAL_COMMUNITY)
Admission: RE | Admit: 2017-06-20 | Discharge: 2017-06-20 | Disposition: A | Discharge status: Home / Self Care | Payer: Medicare Other | Source: Ambulatory Visit | Attending: Cardiology | Admitting: Cardiology

## 2017-06-20 VITALS — BP 112/58 | HR 78 | Wt 180.1 lb

## 2017-06-20 DIAGNOSIS — I5022 Chronic systolic (congestive) heart failure: Secondary | ICD-10-CM | POA: Insufficient documentation

## 2017-06-20 DIAGNOSIS — I10 Essential (primary) hypertension: Secondary | ICD-10-CM

## 2017-06-20 DIAGNOSIS — J441 Chronic obstructive pulmonary disease with (acute) exacerbation: Secondary | ICD-10-CM | POA: Diagnosis not present

## 2017-06-20 DIAGNOSIS — Z87891 Personal history of nicotine dependence: Secondary | ICD-10-CM | POA: Diagnosis not present

## 2017-06-20 DIAGNOSIS — Z8551 Personal history of malignant neoplasm of bladder: Secondary | ICD-10-CM | POA: Insufficient documentation

## 2017-06-20 DIAGNOSIS — E118 Type 2 diabetes mellitus with unspecified complications: Secondary | ICD-10-CM

## 2017-06-20 DIAGNOSIS — R001 Bradycardia, unspecified: Secondary | ICD-10-CM | POA: Diagnosis not present

## 2017-06-20 DIAGNOSIS — I480 Paroxysmal atrial fibrillation: Secondary | ICD-10-CM | POA: Insufficient documentation

## 2017-06-20 DIAGNOSIS — Z8249 Family history of ischemic heart disease and other diseases of the circulatory system: Secondary | ICD-10-CM | POA: Insufficient documentation

## 2017-06-20 DIAGNOSIS — Z9889 Other specified postprocedural states: Secondary | ICD-10-CM | POA: Insufficient documentation

## 2017-06-20 DIAGNOSIS — Z951 Presence of aortocoronary bypass graft: Secondary | ICD-10-CM

## 2017-06-20 DIAGNOSIS — I110 Hypertensive heart disease with heart failure: Secondary | ICD-10-CM | POA: Insufficient documentation

## 2017-06-20 DIAGNOSIS — N452 Orchitis: Secondary | ICD-10-CM | POA: Insufficient documentation

## 2017-06-20 DIAGNOSIS — R42 Dizziness and giddiness: Secondary | ICD-10-CM | POA: Insufficient documentation

## 2017-06-20 DIAGNOSIS — I252 Old myocardial infarction: Secondary | ICD-10-CM | POA: Diagnosis not present

## 2017-06-20 DIAGNOSIS — I2510 Atherosclerotic heart disease of native coronary artery without angina pectoris: Secondary | ICD-10-CM | POA: Diagnosis not present

## 2017-06-20 DIAGNOSIS — E119 Type 2 diabetes mellitus without complications: Secondary | ICD-10-CM | POA: Diagnosis not present

## 2017-06-20 DIAGNOSIS — I2583 Coronary atherosclerosis due to lipid rich plaque: Secondary | ICD-10-CM

## 2017-06-20 DIAGNOSIS — Z7984 Long term (current) use of oral hypoglycemic drugs: Secondary | ICD-10-CM | POA: Diagnosis not present

## 2017-06-20 DIAGNOSIS — Z79899 Other long term (current) drug therapy: Secondary | ICD-10-CM | POA: Diagnosis not present

## 2017-06-20 DIAGNOSIS — I5042 Chronic combined systolic (congestive) and diastolic (congestive) heart failure: Secondary | ICD-10-CM

## 2017-06-20 DIAGNOSIS — Z7982 Long term (current) use of aspirin: Secondary | ICD-10-CM | POA: Diagnosis not present

## 2017-06-20 LAB — BASIC METABOLIC PANEL
Anion gap: 10 (ref 5–15)
BUN: 16 mg/dL (ref 6–20)
CO2: 22 mmol/L (ref 22–32)
CREATININE: 1.31 mg/dL — AB (ref 0.61–1.24)
Calcium: 9.2 mg/dL (ref 8.9–10.3)
Chloride: 103 mmol/L (ref 101–111)
GFR calc Af Amer: >60 mL/min (ref >60)
GFR calc non Af Amer: 53 mL/min — ABNORMAL LOW (ref >60)
Glucose, Bld: 120 mg/dL — ABNORMAL HIGH (ref 65–99)
POTASSIUM: 4.7 mmol/L (ref 3.5–5.1)
SODIUM: 135 mmol/L (ref 135–145)

## 2017-06-20 NOTE — Patient Instructions (Signed)
Routine lab work today. Will notify you of abnormal results, otherwise no news is good news!  Follow up 6-8 weeks with Dr. Haroldine Laws.  _____________________________________________________________ [name] [name] Code: 1100  Take all medication as prescribed the day of your appointment. Bring all medications with you to your appointment.  Do the following things EVERYDAY: 1) Weigh yourself in the morning before breakfast. Write it down and keep it in a log. 2) Take your medicines as prescribed 3) Eat low salt foods-Limit salt (sodium) to 2000 mg per day.  4) Stay as active as you can everyday 5) Limit all fluids for the day to less than 2 liters

## 2017-06-20 NOTE — Progress Notes (Signed)
Advanced Heart Failure Clinic Note   Primary Cardiologist: Dr. Haroldine Laws   HPI:  [name] [name] is a 72 y.o. 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   Pt admitted in 12/18 with anterior STEMI and developed cardiogenic shock requiring Impella support. Urgent cath showed severe 3v CAD as below. While on impella, pt developed sepsis of unclear source with WBC ~ 40K, though did have pseudomonas on his UCx. Treated for AECOPD and completed ABX therapy. BCx negative. TCTS followed while on pump for CABG.  Pt stabilized and underwent CABG x 2 with LIMA to LAD and SVG to OM on 04/08/2017. Pt transiently required milrinone but weaned prior to discharge. Required amiodarone for brief post op afib, but had no recurrence.   He presents today for regular follow up. He feels better since last visit.  He has had recurrent orchitis and is back on Levaquin.  He has had dizziness since starting on levaquin, and had the same symptoms the previous time he was on levaquin. Weight stable at 180 at home. He has not taken any lasix. Taking all medications as directed. Denies palpitations or exertional chest pain.  He was not dizzy at all prior to starting levaquin, and his weight has not changed.   Review of systems complete and found to be negative unless listed in HPI.    [facility] 03/31/2017  Prox RCA lesion is 100% stenosed. Dist RCA lesion is 100% stenosed (retrograde filling from collaterals does not go beyond the distal vessel) -->faint collateral filling to the distal PL and PDA from septal perforators and a diffusely diseased AV groove circumflex  LAV Groove lesion is 90% stenosed.  Prox LAD-1 lesion is 95% stenosed. Prox LAD-2 lesion is 85% stenosed. Heavily calcified, tandem lesions in very tortuous segment of the vessel.  Prox Cx  to Mid Cx lesion is 70% stenosed. Heavily calcified  There is severe left ventricular systolic dysfunction. The left ventricular ejection fraction is less than 25% by visual estimate.  LV end diastolic pressure is severely elevated -prior to Impella insertion  Hemodynamic findings consistent with moderate pulmonary hypertension.  Successful Impella insertion with 3.5-3.6 LPM flow's  Echo 04/01/2017 EF 15-20%  Echo 04/16/17 (post op) Improved to 25-30% on 04/16/17  Past Medical History:  Diagnosis Date  . Bladder cancer ([place])   . Borderline glaucoma   . Diabetes mellitus type 2, controlled ([place]) ORAL MED  . Essential hypertension     Current Outpatient Medications  Medication Sig Dispense Refill  . aspirin EC 325 MG EC tablet Take 1 tablet (325 mg total) by mouth daily.    [name] atorvastatin (LIPITOR) 80 MG tablet Take 1 tablet (80 mg total) by mouth at bedtime. 30 tablet 1  . digoxin (LANOXIN) 0.125 MG tablet Take 0.5 tablets (0.0625 mg total) by mouth daily. 30 tablet 0  . levofloxacin (LEVAQUIN) 500 MG tablet Take 500 mg by mouth daily.    [name] losartan (COZAAR) 25 MG tablet Take 0.5 tablets (12.5 mg total) by mouth 2 (two) times daily. 30 tablet 5  . mometasone (NASONEX) 50 MCG/ACT nasal spray Place 1-2 sprays into the nose daily as needed for allergies.    [name] oxyCODONE (OXY IR/ROXICODONE) 5 MG immediate release tablet Take 1 tablet (5 mg total) by mouth every 6 (six) hours as needed  for severe pain. (Patient taking differently: Take 5 mg by mouth at bedtime. ) 30 tablet 0  . pioglitazone-metformin (ACTOPLUS MET) 15-500 MG per tablet Take 1 tablet by mouth daily.     [name] spironolactone (ALDACTONE) 25 MG tablet Take 1 tablet (25 mg total) by mouth daily. 30 tablet 3   No current facility-administered medications for this encounter.     No Known Allergies    Social History   Socioeconomic History  . Marital status: Married    Spouse name: Not on file  . Number of children: Not on  file  . Years of education: Not on file  . Highest education level: Not on file  Social Needs  . Financial resource strain: Not on file  . Food insecurity - worry: Not on file  . Food insecurity - inability: Not on file  . Transportation needs - medical: Not on file  . Transportation needs - non-medical: Not on file  Occupational History  . Not on file  Tobacco Use  . Smoking status: Former Smoker    Packs/day: 1.00    Years: 40.00    Pack years: 40.00    Types: Cigarettes    Last attempt to quit: 03/31/2017    Years since quitting: 0.2  . Smokeless tobacco: Never Used  Substance and Sexual Activity  . Alcohol use: No  . Drug use: No  . Sexual activity: Not on file  Other Topics Concern  . Not on file  Social History Narrative  . Not on file   Family History  Problem Relation Age of Onset  . Heart attack Sister   . Heart disease Sister     Vitals:   06/20/17 1326  BP: (!) 112/58  Pulse: 78  SpO2: 97%  Weight: 180 lb 2 oz (81.7 kg)   Wt Readings from Last 3 Encounters:  06/20/17 180 lb 2 oz (81.7 kg)  06/04/17 178 lb (80.7 kg)  05/29/17 181 lb (82.1 kg)    PHYSICAL EXAM: General: Well appearing. No resp difficulty. HEENT: Normal Neck: Supple. JVP 5-6. Carotids 2+ bilat; no bruits. No thyromegaly or nodule noted. Cor: PMI nondisplaced. RRR, No M/G/R noted Lungs: CTAB, normal effort. Abdomen: Soft, non-tender, non-distended, no HSM. No bruits or masses. +BS  Extremities: No cyanosis, clubbing, or rash. R and LLE no edema.  Neuro: Alert & orientedx3, cranial nerves grossly intact. moves all 4 extremities w/o difficulty. Affect pleasant   ASSESSMENT & PLAN:  1. Chronic systolic CHF due to ICM  - EF 04/01/17 EF 15-20% -> Improved to 25-30% on 04/16/17 Post op CABG as below. Bedside echo by Dr. Haroldine Laws appears ~30% with HK apex. - NYHA II-III symptoms - Volume status stable on exam. ReDs Vest 27%. Not taking lasix.  - No room for BB with bradycardia.  -  Continue digoxin 0.0625. Level stable last check.  - Continue spironolactone 25 mg daily. BMET today.  - Continue losartan 12.5 mg BID. Will not increase today with lightheadedness  - Reinforced fluid restriction to < 2 L daily, sodium restriction to less than 2000 mg daily, and the importance of daily weights.   - Referred to cardiac rehab. Needs to start.   2. CAD - s/p CABG with LIMA to LAD and SVG to OM on 04/08/2017 - No s/s of ischemia.    - Continue ASA and statin.   3. Tobacco abuse: Smoked 1 ppd PTA for > 50 years.  - No longer smoking. Congratulated on success thus far.  No change.   4. Hx of bladder CA:  - s/p Multiple rounds of BCG therapy. Stable.  - Is on ABX for recurrent Orchitis.  5. Post op Atrial fibrillation - Sinus brady by EKG at last visit by EKG. Regular pulse in 70s today.  - Amio has been stopped.   6. Bradycardia - Resolved off Amio.  - Follow pulse at home. If remains < 50 may need to consider PPM.   7. HTN - Adjusting meds as above. No room to up-titrate meds  8. Dizziness - Pt is not orthostatic and ReDs vest 27%.  - Has happened anytime he has been on Levaquin (By Uptodate has 3% incidence of dizziness in patients) - If persists past ABX usage, pt knows to call.   Labs today. No med titration with lightheadedness. He comes back the week of 07/01/17 for inclusion in the [place] study. RTC 6-8 weeks. Sooner with symptoms.   Shirley Friar, PA-C 06/20/17   Greater than 50% of the 25 minute visit was spent in counseling/coordination of care regarding disease state education, salt/fluid restriction, sliding scale diuretics, and medication compliance.

## 2017-07-01 ENCOUNTER — Ambulatory Visit (HOSPITAL_COMMUNITY)
Admission: RE | Admit: 2017-07-01 | Discharge: 2017-07-01 | Disposition: A | Discharge status: Home / Self Care | Payer: Medicare Other | Source: Ambulatory Visit | Attending: Cardiology | Admitting: Cardiology

## 2017-07-01 DIAGNOSIS — D509 Iron deficiency anemia, unspecified: Secondary | ICD-10-CM | POA: Diagnosis present

## 2017-07-01 LAB — IRON AND TIBC
IRON: 35 ug/dL — AB (ref 45–182)
Saturation Ratios: 13 % — ABNORMAL LOW (ref 17.9–39.5)
TIBC: 280 ug/dL (ref 250–450)
UIBC: 245 ug/dL

## 2017-07-01 LAB — FERRITIN: Ferritin: 368 ng/mL — ABNORMAL HIGH (ref 24–336)

## 2017-07-02 ENCOUNTER — Other Ambulatory Visit (HOSPITAL_COMMUNITY): Payer: Self-pay

## 2017-07-02 DIAGNOSIS — D509 Iron deficiency anemia, unspecified: Secondary | ICD-10-CM

## 2017-07-09 ENCOUNTER — Other Ambulatory Visit (HOSPITAL_COMMUNITY): Payer: Self-pay

## 2017-07-09 ENCOUNTER — Telehealth (HOSPITAL_COMMUNITY): Payer: Self-pay

## 2017-07-09 ENCOUNTER — Encounter (HOSPITAL_COMMUNITY)
Admission: RE | Admit: 2017-07-09 | Discharge: 2017-07-09 | Disposition: A | Discharge status: Home / Self Care | Payer: Medicare Other | Source: Ambulatory Visit | Attending: Internal Medicine | Admitting: Internal Medicine

## 2017-07-09 DIAGNOSIS — D509 Iron deficiency anemia, unspecified: Secondary | ICD-10-CM

## 2017-07-09 DIAGNOSIS — I252 Old myocardial infarction: Secondary | ICD-10-CM | POA: Insufficient documentation

## 2017-07-09 DIAGNOSIS — Z951 Presence of aortocoronary bypass graft: Secondary | ICD-10-CM | POA: Diagnosis not present

## 2017-07-09 DIAGNOSIS — D649 Anemia, unspecified: Secondary | ICD-10-CM | POA: Diagnosis not present

## 2017-07-09 MED ORDER — SODIUM CHLORIDE 0.9 % IV SOLN
510.0000 mg | Freq: Once | INTRAVENOUS | Status: AC
  Administered 2017-07-09: 510 mg via INTRAVENOUS
  Filled 2017-07-09: qty 17

## 2017-07-09 NOTE — Telephone Encounter (Signed)
Cardiac Rehab Medication Review by a Pharmacist  Does the patient  feel that his/her medications are working for him/her?  yes  Has the patient been experiencing any side effects to the medications prescribed?  no  Does the patient measure his/her own blood pressure or blood glucose at home?  no   Does the patient have any problems obtaining medications due to transportation or finances?   no  Understanding of regimen: excellent Understanding of indications: excellent Potential of compliance: excellent   Pharmacist comments: Patient endorses medication adherence and denies adverse side effects. He does not measure his blood pressure at home. For his diabetes, he was not instructed to check blood glucose at home and his PCP measures his A1c yearly.    Leroy Libman, PharmD Pharmacy Resident Pager: [phone]

## 2017-07-11 ENCOUNTER — Encounter (HOSPITAL_COMMUNITY)
Admission: RE | Admit: 2017-07-11 | Discharge: 2017-07-11 | Disposition: A | Discharge status: Home / Self Care | Payer: Medicare Other | Source: Ambulatory Visit | Attending: Internal Medicine | Admitting: Internal Medicine

## 2017-07-11 ENCOUNTER — Encounter (HOSPITAL_COMMUNITY): Payer: Self-pay

## 2017-07-11 VITALS — BP 118/70 | HR 75 | Ht 73.0 in | Wt 179.2 lb

## 2017-07-11 DIAGNOSIS — Z951 Presence of aortocoronary bypass graft: Secondary | ICD-10-CM

## 2017-07-11 DIAGNOSIS — D649 Anemia, unspecified: Secondary | ICD-10-CM | POA: Diagnosis not present

## 2017-07-11 DIAGNOSIS — I213 ST elevation (STEMI) myocardial infarction of unspecified site: Secondary | ICD-10-CM

## 2017-07-11 HISTORY — DX: Atherosclerotic heart disease of native coronary artery without angina pectoris: I25.10

## 2017-07-11 HISTORY — DX: Heart failure, unspecified: I50.9

## 2017-07-11 NOTE — Progress Notes (Signed)
[name] [name] 71 y.o. male DOB: 03/03/1946 MRN: [id number]      Nutrition Note  1. S/P CABG x 2   2. ST elevation myocardial infarction (STEMI), unspecified artery ([facility])    Past Medical History:  Diagnosis Date  . Bladder cancer ([facility])   . Borderline glaucoma   . Diabetes mellitus type 2, controlled ([facility]) ORAL MED  . Essential hypertension    Meds reviewed. Actoplus Met noted  HT: Ht Readings from Last 1 Encounters:  07/09/17 6' 2" (1.88 m)    WT: Wt Readings from Last 3 Encounters:  07/09/17 179 lb (81.2 kg)  06/20/17 180 lb 2 oz (81.7 kg)  06/04/17 178 lb (80.7 kg)     BMI 23.2   Current tobacco use? No Recently quit tobacco use 03/31/17  Labs:  Lipid Panel     Component Value Date/Time   CHOL 129 04/01/2017 0235   TRIG 93 04/01/2017 0235   HDL 28 (L) 04/01/2017 0235   CHOLHDL 4.6 04/01/2017 0235   VLDL 19 04/01/2017 0235   LDLCALC 82 04/01/2017 0235    Lab Results  Component Value Date   HGBA1C 6.3 (H) 03/31/2017   CBG (last 3)  No results for input(s): GLUCAP in the last 72 hours.  Nutrition Note Spoke with pt. Nutrition plan and goals reviewed with pt. Pt is following Step 1 of the Therapeutic Lifestyle Changes diet. Per discussion, pt is not interested in making any changes to his diet at this time. Pt is diabetic. Last A1c indicates blood glucose well-controlled. Pt does not check CBG's at home and does not own a glucometer. Pt with dx of CHF NYHA class II-III symptoms noted in MD note 06/20/17. Pt noted to be taking ActoPlus Metformin. Pt expressed understanding of the information reviewed. Pt aware of nutrition education classes offered.  Nutrition Diagnosis ? Food-and nutrition-related knowledge deficit related to lack of exposure to information as related to diagnosis of: ? CVD ? DM  Nutrition Intervention ? Pt's individual nutrition plan and goals reviewed with pt. ? Will contact pt's PCP re: ? Change Actoplus Met to another DM medication due to  NYHA classII-III CHF symptoms  Nutrition Goal(s):  ? Pt to identify and limit food sources of saturated fat, trans fat, and sodium ? Pt to describe the benefit of including fruits, vegetables, whole grains, and low-fat dairy products in a heart healthy meal plan.  Plan:  Pt to attend nutrition classes ? Nutrition I ? Nutrition II ? Portion Distortion ? Diabetes Blitz ? Diabetes Q & A Will provide client-centered nutrition education as part of interdisciplinary care.   Monitor and evaluate progress toward nutrition goal with team.  Edna Franko, M.Ed, RD, LDN, CDE 07/11/2017 11:14 AM 

## 2017-07-11 NOTE — Progress Notes (Signed)
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  aspirin EC 325 MG EC tablet, Take 1 tablet (325 mg total) by mouth daily., Disp: , Rfl:  .  atorvastatin (LIPITOR) 80 MG tablet, Take 1 tablet (80 mg total) by mouth at bedtime., Disp: 30 tablet, Rfl: 1 .  digoxin (LANOXIN) 0.125 MG tablet, Take 0.5 tablets (0.0625 mg total) by mouth daily., Disp: 30 tablet, Rfl: 0 .  levofloxacin (LEVAQUIN) 500 MG tablet, Take 500 mg by mouth daily., Disp: , Rfl:  .  losartan (COZAAR) 25 MG tablet, Take 0.5 tablets (12.5 mg total) by mouth 2 (two) times daily., Disp: 30 tablet, Rfl: 5 .  mometasone (NASONEX) 50 MCG/ACT nasal spray, Place 1-2 sprays into the nose daily as needed for allergies., Disp: , Rfl:  .  oxyCODONE (OXY IR/ROXICODONE) 5 MG immediate release tablet, Take 1 tablet (5 mg total) by mouth every 6 (six) hours as needed for severe pain. (Patient not taking: Reported on 07/09/2017), Disp: 30 tablet, Rfl: 0 .  pioglitazone-metformin (ACTOPLUS MET) 15-500 MG per tablet, Take 1 tablet by mouth daily. , Disp: , Rfl:  .  spironolactone (ALDACTONE) 25 MG tablet, Take 1 tablet (25 mg total) by mouth daily., Disp: 30 tablet, Rfl: 3  Past Medical History: Past Medical History:  Diagnosis Date  . Bladder cancer ([place])   . Borderline glaucoma   . CHF (congestive heart failure) ([place])   .  Coronary artery disease   . Diabetes mellitus type 2, controlled ([place]) ORAL MED  . Essential hypertension     Tobacco Use: Social History   Tobacco Use  Smoking Status Former Smoker  . Packs/day: 1.00  . Years: 40.00  . Pack years: 40.00  . Types: Cigarettes  . Last attempt to quit: 03/31/2017  . Years since quitting: 0.2  Smokeless Tobacco Never Used    Labs: Recent Review Flowsheet Data    Labs for ITP Cardiac and Pulmonary Rehab Latest Ref Rng & Units 04/13/2017 04/14/2017 04/15/2017 04/16/2017 04/16/2017   Cholestrol 0 - 200 mg/dL - - - - -   LDLCALC 0 - 99 mg/dL - - - - -   HDL >40 mg/dL - - - - -   Trlycerides <150 mg/dL - - - - -   Hemoglobin A1c 4.8 - 5.6 % - - - - -   PHART 7.350 - 7.450 - - - - -   PCO2ART 32.0 - 48.0 mmHg - - - - -   HCO3 20.0 - 28.0 mmol/L - - - - -   TCO2 22 - 32 mmol/L - - - - -   ACIDBASEDEF 0.0 - 2.0 mmol/L - - - - -   O2SAT % 53.5 91.5 62.0 47.7 84.2      Capillary Blood Glucose: Lab Results  Component Value Date   GLUCAP 113 (H) 05/13/2017   GLUCAP 132 (  H) 05/12/2017   GLUCAP 141 (H) 05/12/2017   GLUCAP 124 (H) 05/12/2017   GLUCAP 102 (H) 05/12/2017     Exercise Target Goals: Date: 07/11/17  Exercise Program Goal: Individual exercise prescription set using results from initial 6 min walk test and THRR while considering  patient's activity barriers and safety.   Exercise Prescription Goal: Initial exercise prescription builds to 30-45 minutes a day of aerobic activity, 2-3 days per week.  Home exercise guidelines will be given to patient during program as part of exercise prescription that the participant will acknowledge.  Activity Barriers & Risk Stratification: Activity Barriers & Cardiac Risk Stratification - 07/11/17 0925      Activity Barriers & Cardiac Risk Stratification   Activity Barriers  Joint Problems;Deconditioning;Muscular Weakness;Other (comment)    Comments  R hip pain, numbness in feet    Cardiac Risk  Stratification  High       6 Minute Walk: 6 Minute Walk    Row Name 07/11/17 0924 07/11/17 1030 07/11/17 1042     6 Minute Walk   Phase  Initial  -  -   Distance  1349 feet  -  -   Walk Time  6 minutes  -  -   # of Rest Breaks  0  -  -   MPH  -  -  2.6   METS  -  -  3.4   RPE  11  -  -   VO2 Peak  -  -  12.03   Symptoms  Yes (comment)  -  -   Comments  R  hip/shin pain 8/10; numbness in feet, B  -  -   Resting HR  75 bpm  -  -   Resting BP  118/70  -  -   Resting Oxygen Saturation   98 %  -  -   Exercise Oxygen Saturation  during 6 min walk  100 %  -  -   Max Ex. HR  -  118 bpm  -   Max Ex. BP  128/60  -  -   Row Name 07/11/17 1057         6 Minute Walk   2 Minute Post BP  112/66        Oxygen Initial Assessment:   Oxygen Re-Evaluation:   Oxygen Discharge (Final Oxygen Re-Evaluation):   Initial Exercise Prescription: Initial Exercise Prescription - 07/11/17 1000      Date of Initial Exercise RX and Referring Provider   Date  07/11/17    Referring Provider  Bensimhon, Daniel MD      Recumbant Bike   Level  2    Minutes  10    METs  2      NuStep   Level  2    SPM  80    Minutes  15    METs  2      Arm Ergometer   Level  --    Minutes  --    METs  --      Track   Laps  4    Minutes  5    METs  --      Prescription Details   Frequency (times per week)  3    Duration  Progress to 30 minutes of continuous aerobic without signs/symptoms of physical distress      Intensity   THRR 40-80% of Max Heartrate  60-119    Ratings of Perceived Exertion  11-13    Perceived Dyspnea  0-4      Progression   Progression  Continue to progress workloads to maintain intensity without signs/symptoms of physical distress.      Resistance Training   Training Prescription  Yes    Weight  2lbs    Reps  10-15       Perform Capillary Blood Glucose checks as needed.  Exercise Prescription Changes:   Exercise Comments:   Exercise Goals and  Review:  Exercise Goals    Row Name 07/11/17 0926             Exercise Goals   Increase Physical Activity  Yes       Intervention  Provide advice, education, support and counseling about physical activity/exercise needs.;Develop an individualized exercise prescription for aerobic and resistive training based on initial evaluation findings, risk stratification, comorbidities and participant's personal goals.       Expected Outcomes  Short Term: Attend rehab on a regular basis to increase amount of physical activity.;Long Term: Exercising regularly at least 3-5 days a week.;Long Term: Add in home exercise to make exercise part of routine and to increase amount of physical activity.       Increase Strength and Stamina  Yes return to fishing       Intervention  Provide advice, education, support and counseling about physical activity/exercise needs.;Develop an individualized exercise prescription for aerobic and resistive training based on initial evaluation findings, risk stratification, comorbidities and participant's personal goals.       Expected Outcomes  Short Term: Increase workloads from initial exercise prescription for resistance, speed, and METs.;Short Term: Perform resistance training exercises routinely during rehab and add in resistance training at home;Long Term: Improve cardiorespiratory fitness, muscular endurance and strength as measured by increased METs and functional capacity (6MWT)       Able to understand and use rate of perceived exertion (RPE) scale  Yes       Intervention  Provide education and explanation on how to use RPE scale       Expected Outcomes  Short Term: Able to use RPE daily in rehab to express subjective intensity level;Long Term:  Able to use RPE to guide intensity level when exercising independently       Knowledge and understanding of Target Heart Rate Range (THRR)  Yes       Intervention  Provide education and explanation of THRR including how the numbers  were predicted and where they are located for reference       Expected Outcomes  Short Term: Able to state/look up THRR;Long Term: Able to use THRR to govern intensity when exercising independently;Short Term: Able to use daily as guideline for intensity in rehab       Able to check pulse independently  Yes       Intervention  Provide education and demonstration on how to check pulse in carotid and radial arteries.;Review the importance of being able to check your own pulse for safety during independent exercise       Expected Outcomes  Short Term: Able to explain why pulse checking is important during independent exercise;Long Term: Able to check pulse independently and accurately       Understanding of Exercise Prescription  Yes       Intervention  Provide education, explanation, and written materials on patient's individual exercise prescription       Expected Outcomes  Short Term: Able to explain program exercise prescription;Long Term: Able to explain home  exercise prescription to exercise independently          Exercise Goals Re-Evaluation :    Discharge Exercise Prescription (Final Exercise Prescription Changes):   Nutrition:  Target Goals: Understanding of nutrition guidelines, daily intake of sodium '1500mg'$ , cholesterol '200mg'$ , calories 30% from fat and 7% or less from saturated fats, daily to have 5 or more servings of fruits and vegetables.  Biometrics: Pre Biometrics - 07/11/17 1142      Pre Biometrics   Height  '6\' 1"'$  (1.854 m)    Weight  179 lb 3.7 oz (81.3 kg)    Waist Circumference  37.75 inches    Hip Circumference  41 inches    Waist to Hip Ratio  0.92 %    BMI (Calculated)  23.65    Triceps Skinfold  13 mm    % Body Fat  24.2 %    Grip Strength  42 kg    Flexibility  9 in    Single Leg Stand  10.41 seconds        Nutrition Therapy Plan and Nutrition Goals: Nutrition Therapy & Goals - 07/11/17 1126      Nutrition Therapy   Diet  Carb Modified, Heart  Healthy      Personal Nutrition Goals   Nutrition Goal  Pt to identify and limit food sources of saturated fat, trans fat, and sodium    Personal Goal #2  Pt to describe the benefit of including fruits, vegetables, whole grains, and low-fat dairy products in a heart healthy meal plan.      Intervention Plan   Intervention  Prescribe, educate and counsel regarding individualized specific dietary modifications aiming towards targeted core components such as weight, hypertension, lipid management, diabetes, heart failure and other comorbidities.    Expected Outcomes  Short Term Goal: Understand basic principles of dietary content, such as calories, fat, sodium, cholesterol and nutrients.;Long Term Goal: Adherence to prescribed nutrition plan.       Nutrition Assessments: Nutrition Assessments - 07/11/17 1126      MEDFICTS Scores   Pre Score  62       Nutrition Goals Re-Evaluation:   Nutrition Goals Re-Evaluation:   Nutrition Goals Discharge (Final Nutrition Goals Re-Evaluation):   Psychosocial: Target Goals: Acknowledge presence or absence of significant depression and/or stress, maximize coping skills, provide positive support system. Participant is able to verbalize types and ability to use techniques and skills needed for reducing stress and depression.  Initial Review & Psychosocial Screening: Initial Psych Review & Screening - 07/11/17 1137      Initial Review   Current issues with  None Identified      Family Dynamics   Good Support System?  Yes Patient has his wife for support      Barriers   Psychosocial barriers to participate in program  There are no identifiable barriers or psychosocial needs.      Screening Interventions   Interventions  Encouraged to exercise       Quality of Life Scores: Quality of Life - 07/11/17 1037      Quality of Life Scores   Health/Function Pre  21.4 %    Socioeconomic Pre  27.08 %    Psych/Spiritual Pre  23.33 %    Family Pre   27.3 %    GLOBAL Pre  23.75 %      Scores of 19 and below usually indicate a poorer quality of life in these areas.  A difference of  2-3 points is  a clinically meaningful difference.  A difference of 2-3 points in the total score of the Quality of Life Index has been associated with significant improvement in overall quality of life, self-image, physical symptoms, and general health in studies assessing change in quality of life.  PHQ-9: Recent Review Flowsheet Data    There is no flowsheet data to display.     Interpretation of Total Score  Total Score Depression Severity:  1-4 = Minimal depression, 5-9 = Mild depression, 10-14 = Moderate depression, 15-19 = Moderately severe depression, 20-27 = Severe depression   Psychosocial Evaluation and Intervention:   Psychosocial Re-Evaluation:   Psychosocial Discharge (Final Psychosocial Re-Evaluation):   Vocational Rehabilitation: Provide vocational rehab assistance to qualifying candidates.   Vocational Rehab Evaluation & Intervention: Vocational Rehab - 07/11/17 1137      Initial Vocational Rehab Evaluation & Intervention   Assessment shows need for Vocational Rehabilitation  No       Education: Education Goals: Education classes will be provided on a weekly basis, covering required topics. Participant will state understanding/return demonstration of topics presented.  Learning Barriers/Preferences: Learning Barriers/Preferences - 07/11/17 2751      Learning Barriers/Preferences   Learning Barriers  Sight    Learning Preferences  Written Material;Pictoral;Video       Education Topics: Count Your Pulse:  -Group instruction provided by verbal instruction, demonstration, patient participation and written materials to support subject.  Instructors address importance of being able to find your pulse and how to count your pulse when at home without a heart monitor.  Patients get hands on experience counting their pulse with  staff help and individually.   Heart Attack, Angina, and Risk Factor Modification:  -Group instruction provided by verbal instruction, video, and written materials to support subject.  Instructors address signs and symptoms of angina and heart attacks.    Also discuss risk factors for heart disease and how to make changes to improve heart health risk factors.   Functional Fitness:  -Group instruction provided by verbal instruction, demonstration, patient participation, and written materials to support subject.  Instructors address safety measures for doing things around the house.  Discuss how to get up and down off the floor, how to pick things up properly, how to safely get out of a chair without assistance, and balance training.   Meditation and Mindfulness:  -Group instruction provided by verbal instruction, patient participation, and written materials to support subject.  Instructor addresses importance of mindfulness and meditation practice to help reduce stress and improve awareness.  Instructor also leads participants through a meditation exercise.    Stretching for Flexibility and Mobility:  -Group instruction provided by verbal instruction, patient participation, and written materials to support subject.  Instructors lead participants through series of stretches that are designed to increase flexibility thus improving mobility.  These stretches are additional exercise for major muscle groups that are typically performed during regular warm up and cool down.   Hands Only CPR:  -Group verbal, video, and participation provides a basic overview of AHA guidelines for community CPR. Role-play of emergencies allow participants the opportunity to practice calling for help and chest compression technique with discussion of AED use.   Hypertension: -Group verbal and written instruction that provides a basic overview of hypertension including the most recent diagnostic guidelines, risk factor  reduction with self-care instructions and medication management.    Nutrition I class: Heart Healthy Eating:  -Group instruction provided by PowerPoint slides, verbal discussion, and written materials to support subject  matter. The instructor gives an explanation and review of the Therapeutic Lifestyle Changes diet recommendations, which includes a discussion on lipid goals, dietary fat, sodium, fiber, plant stanol/sterol esters, sugar, and the components of a well-balanced, healthy diet.   Nutrition II class: Lifestyle Skills:  -Group instruction provided by PowerPoint slides, verbal discussion, and written materials to support subject matter. The instructor gives an explanation and review of label reading, grocery shopping for heart health, heart healthy recipe modifications, and ways to make healthier choices when eating out.   Diabetes Question & Answer:  -Group instruction provided by PowerPoint slides, verbal discussion, and written materials to support subject matter. The instructor gives an explanation and review of diabetes co-morbidities, pre- and post-prandial blood glucose goals, pre-exercise blood glucose goals, signs, symptoms, and treatment of hypoglycemia and hyperglycemia, and foot care basics.   Diabetes Blitz:  -Group instruction provided by PowerPoint slides, verbal discussion, and written materials to support subject matter. The instructor gives an explanation and review of the physiology behind type 1 and type 2 diabetes, diabetes medications and rational behind using different medications, pre- and post-prandial blood glucose recommendations and Hemoglobin A1c goals, diabetes diet, and exercise including blood glucose guidelines for exercising safely.    Portion Distortion:  -Group instruction provided by PowerPoint slides, verbal discussion, written materials, and food models to support subject matter. The instructor gives an explanation of serving size versus portion  size, changes in portions sizes over the last 20 years, and what consists of a serving from each food group.   Stress Management:  -Group instruction provided by verbal instruction, video, and written materials to support subject matter.  Instructors review role of stress in heart disease and how to cope with stress positively.     Exercising on Your Own:  -Group instruction provided by verbal instruction, power point, and written materials to support subject.  Instructors discuss benefits of exercise, components of exercise, frequency and intensity of exercise, and end points for exercise.  Also discuss use of nitroglycerin and activating EMS.  Review options of places to exercise outside of rehab.  Review guidelines for sex with heart disease.   Cardiac Drugs I:  -Group instruction provided by verbal instruction and written materials to support subject.  Instructor reviews cardiac drug classes: antiplatelets, anticoagulants, beta blockers, and statins.  Instructor discusses reasons, side effects, and lifestyle considerations for each drug class.   Cardiac Drugs II:  -Group instruction provided by verbal instruction and written materials to support subject.  Instructor reviews cardiac drug classes: angiotensin converting enzyme inhibitors (ACE-I), angiotensin II receptor blockers (ARBs), nitrates, and calcium channel blockers.  Instructor discusses reasons, side effects, and lifestyle considerations for each drug class.   Anatomy and Physiology of the Circulatory System:  Group verbal and written instruction and models provide basic cardiac anatomy and physiology, with the coronary electrical and arterial systems. Review of: AMI, Angina, Valve disease, Heart Failure, Peripheral Artery Disease, Cardiac Arrhythmia, Pacemakers, and the ICD.   Other Education:  -Group or individual verbal, written, or video instructions that support the educational goals of the cardiac rehab  program.   Holiday Eating Survival Tips:  -Group instruction provided by PowerPoint slides, verbal discussion, and written materials to support subject matter. The instructor gives patients tips, tricks, and techniques to help them not only survive but enjoy the holidays despite the onslaught of food that accompanies the holidays.   Knowledge Questionnaire Score: Knowledge Questionnaire Score - 07/11/17 1028      Knowledge Questionnaire  Score   Pre Score  18/24       Core Components/Risk Factors/Patient Goals at Admission: Personal Goals and Risk Factors at Admission - 07/11/17 1043      Core Components/Risk Factors/Patient Goals on Admission   Improve shortness of breath with ADL's  Yes    Intervention  Provide education, individualized exercise plan and daily activity instruction to help decrease symptoms of SOB with activities of daily living.    Expected Outcomes  Long Term: Be able to perform more ADLs without symptoms or delay the onset of symptoms;Short Term: Improve cardiorespiratory fitness to achieve a reduction of symptoms when performing ADLs    Diabetes  Yes    Intervention  Provide education about signs/symptoms and action to take for hypo/hyperglycemia.;Provide education about proper nutrition, including hydration, and aerobic/resistive exercise prescription along with prescribed medications to achieve blood glucose in normal ranges: Fasting glucose 65-99 mg/dL    Expected Outcomes  Short Term: Participant verbalizes understanding of the signs/symptoms and immediate care of hyper/hypoglycemia, proper foot care and importance of medication, aerobic/resistive exercise and nutrition plan for blood glucose control.;Long Term: Attainment of HbA1C < 7%.    Heart Failure  Yes    Intervention  Provide a combined exercise and nutrition program that is supplemented with education, support and counseling about heart failure. Directed toward relieving symptoms such as shortness of  breath, decreased exercise tolerance, and extremity edema.    Expected Outcomes  Improve functional capacity of life;Short term: Attendance in program 2-3 days a week with increased exercise capacity. Reported lower sodium intake. Reported increased fruit and vegetable intake. Reports medication compliance.;Long term: Adoption of self-care skills and reduction of barriers for early signs and symptoms recognition and intervention leading to self-care maintenance.;Short term: Daily weights obtained and reported for increase. Utilizing diuretic protocols set by physician.    Hypertension  Yes    Intervention  Provide education on lifestyle modifcations including regular physical activity/exercise, weight management, moderate sodium restriction and increased consumption of fresh fruit, vegetables, and low fat dairy, alcohol moderation, and smoking cessation.;Monitor prescription use compliance.    Expected Outcomes  Short Term: Continued assessment and intervention until BP is < 140/22m HG in hypertensive participants. < 130/856mHG in hypertensive participants with diabetes, heart failure or chronic kidney disease.;Long Term: Maintenance of blood pressure at goal levels.    Lipids  Yes    Intervention  Provide education and support for participant on nutrition & aerobic/resistive exercise along with prescribed medications to achieve LDL '70mg'$ , HDL >'40mg'$ .    Expected Outcomes  Short Term: Participant states understanding of desired cholesterol values and is compliant with medications prescribed. Participant is following exercise prescription and nutrition guidelines.;Long Term: Cholesterol controlled with medications as prescribed, with individualized exercise RX and with personalized nutrition plan. Value goals: LDL < '70mg'$ , HDL > 40 mg.       Core Components/Risk Factors/Patient Goals Review:    Core Components/Risk Factors/Patient Goals at Discharge (Final Review):    ITP Comments: ITP Comments     Row Name 07/11/17 0921           ITP Comments  Dr. TrFransico HimMedical Director          CommentsBob attended orientation from [id number] [phone] to review rules and guidelines for program. Completed 6 minute walk test, Intitial ITP, and exercise prescription.  VSS. Telemetry-Sinus Rhythm with a flat t wave.  Asymptomatic.BoMikki Santees somewhat deconditioned and has chronic hip pain and complained of some numbness in  his feet during his walk test, during the walk test.Bob used a wheel chair during his walk test for stability. Mikki Santee did not complain upon exit from cardiac rehab orientation.Barnet Pall, RN,BSN 07/11/2017 11:57 AM

## 2017-07-15 ENCOUNTER — Other Ambulatory Visit (HOSPITAL_COMMUNITY): Payer: Self-pay | Admitting: *Deleted

## 2017-07-15 ENCOUNTER — Encounter (HOSPITAL_COMMUNITY)
Admission: RE | Admit: 2017-07-15 | Discharge: 2017-07-15 | Disposition: A | Discharge status: Home / Self Care | Payer: Medicare Other | Source: Ambulatory Visit | Attending: Internal Medicine | Admitting: Internal Medicine

## 2017-07-15 ENCOUNTER — Telehealth (HOSPITAL_COMMUNITY): Payer: Self-pay | Admitting: *Deleted

## 2017-07-15 ENCOUNTER — Encounter (HOSPITAL_COMMUNITY): Payer: Self-pay

## 2017-07-15 DIAGNOSIS — D649 Anemia, unspecified: Secondary | ICD-10-CM | POA: Diagnosis not present

## 2017-07-15 DIAGNOSIS — Z951 Presence of aortocoronary bypass graft: Secondary | ICD-10-CM

## 2017-07-15 DIAGNOSIS — I213 ST elevation (STEMI) myocardial infarction of unspecified site: Secondary | ICD-10-CM

## 2017-07-15 LAB — GLUCOSE, CAPILLARY: Glucose-Capillary: 109 mg/dL — ABNORMAL HIGH (ref 65–99)

## 2017-07-15 MED ORDER — DIGOXIN 125 MCG PO TABS: 0.0625 mg | ORAL_TABLET | Freq: Every day | ORAL | 3 refills | Status: DC

## 2017-07-15 NOTE — Progress Notes (Signed)
Daily Session Note  Patient Details  Name: [name] [name] MRN: [id number] Date of Birth: Sep 06, 1945 Referring Provider:     [facility] from 07/11/2017 in [place]  Referring Provider  Glori Bickers MD      Encounter Date: 07/15/2017  Check In: Session Check In - 07/15/17 1415      Check-In   Location  [facility]    Staff Present  Amber Fair, MS, ACSM RCEP, Exercise Physiologist;Tyara Nevels, MS,ACSM CEP, Exercise Physiologist;Joann Rion, RN, BSN;Other;Portia Rollene Rotunda, RN, BSN    Supervising physician immediately available to respond to emergencies  [facility] immediately available    Physician(s)  Dr. Wendee Beavers    Medication changes reported      No    Fall or balance concerns reported     No    Tobacco Cessation  No Change    Warm-up and Cool-down  Performed as group-led instruction    Resistance Training Performed  Yes    VAD Patient?  No      Pain Assessment   Currently in Pain?  No/denies    Multiple Pain Sites  No       Capillary Blood Glucose: Results for orders placed or performed during the hospital encounter of 07/15/17 (from the past 24 hour(s))  Glucose, capillary     Status: Abnormal   Collection Time: 07/15/17  2:25 PM  Result Value Ref Range   Glucose-Capillary 109 (H) 65 - 99 mg/dL      Social History   Tobacco Use  Smoking Status Former Smoker  . Packs/day: 1.00  . Years: 40.00  . Pack years: 40.00  . Types: Cigarettes  . Last attempt to quit: 03/31/2017  . Years since quitting: 0.2  Smokeless Tobacco Never Used    Goals Met:  Exercise tolerated well  Goals Unmet:  Not Applicable  Comments: Pt started cardiac rehab today.  Pt tolerated light exercise without difficulty. VSS, telemetry-SR, asymptomatic.  Medication list reconciled. Pt denies barriers to medicaiton compliance.  PSYCHOSOCIAL ASSESSMENT:  PHQ-0. Pt exhibits positive coping skills, hopeful outlook  with supportive family. No psychosocial needs identified at this time, no psychosocial interventions necessary.   Pt oriented to exercise equipment and routine.    Understanding verbalized.   Dr. Fransico Him is Medical Director for Cardiac Rehab at [facility].

## 2017-07-15 NOTE — Telephone Encounter (Signed)
I have faxed message to Dr. Dois Davenport office @ [phone]

## 2017-07-15 NOTE — Telephone Encounter (Signed)
Advanced Heart Failure Triage Encounter  Patient Name: [name] [name]  Date of Call: 07/15/17  Problem:  Patient has been taking ACTOPLUS MET that was prescribed by his PCP Dr. Kathryne Eriksson [phone]). He was advised by CR that this medication is usually not recommended in patients with history of heart attach.    Plan:  Will forward to Barrington Ellison, PA to review.   Darron Doom, RN

## 2017-07-15 NOTE — Telephone Encounter (Signed)
Please send PCP the following.   " This patient has recently diagnosed HF, and therefore we are recommending change in therapy from [place] MET (Specifically the Actos component of this drug) with its warnings against use in patients with Heart Failure.      Please consider Jardiance, with recent data for decreased mortality in patients with HF"  Thank you,    Legrand Como "Oda Kilts, [place] 07/15/2017 3:41 PM

## 2017-07-16 LAB — GLUCOSE, CAPILLARY: GLUCOSE-CAPILLARY: 92 mg/dL (ref 65–99)

## 2017-07-17 ENCOUNTER — Encounter (HOSPITAL_COMMUNITY)
Admission: RE | Admit: 2017-07-17 | Discharge: 2017-07-17 | Disposition: A | Discharge status: Home / Self Care | Payer: Medicare Other | Source: Ambulatory Visit | Attending: Internal Medicine | Admitting: Internal Medicine

## 2017-07-17 DIAGNOSIS — Z951 Presence of aortocoronary bypass graft: Secondary | ICD-10-CM

## 2017-07-17 DIAGNOSIS — D649 Anemia, unspecified: Secondary | ICD-10-CM | POA: Diagnosis not present

## 2017-07-17 DIAGNOSIS — I213 ST elevation (STEMI) myocardial infarction of unspecified site: Secondary | ICD-10-CM

## 2017-07-17 LAB — GLUCOSE, CAPILLARY: Glucose-Capillary: 77 mg/dL (ref 65–99)

## 2017-07-18 ENCOUNTER — Encounter (HOSPITAL_COMMUNITY): Payer: Self-pay

## 2017-07-18 NOTE — Progress Notes (Signed)
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  aspirin EC 325 MG EC tablet, Take 1 tablet (325 mg total) by mouth daily., Disp: , Rfl:  .  atorvastatin (LIPITOR) 80 MG tablet, Take 1 tablet (80 mg total) by mouth at bedtime., Disp: 30 tablet, Rfl: 1 .  digoxin (LANOXIN) 0.125 MG tablet, Take 0.5 tablets (0.0625 mg total) by mouth daily., Disp: 45 tablet, Rfl: 3 .  losartan (COZAAR) 25 MG tablet, Take 0.5 tablets (12.5 mg total) by mouth 2 (two) times daily., Disp: 30 tablet, Rfl: 5 .  mometasone (NASONEX) 50 MCG/ACT nasal spray, Place 1-2 sprays into the nose daily as needed for allergies., Disp: , Rfl:  .  pioglitazone-metformin (ACTOPLUS MET) 15-500 MG per tablet, Take 1 tablet by mouth daily. , Disp: , Rfl:  .  spironolactone (ALDACTONE) 25 MG tablet, Take 1 tablet (25 mg total) by mouth daily., Disp: 30 tablet, Rfl: 3  Past Medical History: Past Medical History:  Diagnosis Date  . Bladder cancer ([place])   . Borderline glaucoma   . CHF (congestive heart failure) ([place])   . Coronary artery disease   . Diabetes mellitus type 2, controlled ([place]) ORAL MED  . Essential hypertension     Tobacco Use: Social History   Tobacco Use  Smoking Status Former Smoker  . Packs/day: 1.00  . Years: 40.00  . Pack years: 40.00  . Types: Cigarettes  . Last  attempt to quit: 03/31/2017  . Years since quitting: 0.2  Smokeless Tobacco Never Used    Labs: Recent Review Flowsheet Data    Labs for ITP Cardiac and Pulmonary Rehab Latest Ref Rng & Units 04/13/2017 04/14/2017 04/15/2017 04/16/2017 04/16/2017   Cholestrol 0 - 200 mg/dL - - - - -   LDLCALC 0 - 99 mg/dL - - - - -   HDL >40 mg/dL - - - - -   Trlycerides <150 mg/dL - - - - -   Hemoglobin A1c 4.8 - 5.6 % - - - - -   PHART 7.350 - 7.450 - - - - -   PCO2ART 32.0 - 48.0 mmHg - - - - -   HCO3 20.0 - 28.0 mmol/L - - - - -   TCO2 22 - 32 mmol/L - - - - -   ACIDBASEDEF 0.0 - 2.0 mmol/L - - - - -   O2SAT % 53.5 91.5 62.0 47.7 84.2      Capillary Blood Glucose: Lab Results  Component Value Date   GLUCAP 77 07/17/2017   GLUCAP 109 (H) 07/15/2017   GLUCAP 92 07/15/2017   GLUCAP 113 (H) 05/13/2017   GLUCAP 132 (H) 05/12/2017     Exercise Target Goals:    Exercise Program Goal: Individual exercise prescription set using results from initial 6 min walk test and THRR while considering  patient's activity barriers and safety.  Exercise Prescription Goal: Initial exercise prescription builds to 30-45 minutes a day of aerobic activity, 2-3 days per week.  Home exercise guidelines will be given to patient during program as part of exercise prescription that the participant will acknowledge.  Activity Barriers & Risk Stratification: Activity Barriers & Cardiac Risk Stratification - 07/11/17 0925    Activity Barriers & Cardiac Risk Stratification          Activity Barriers  Joint Problems;Deconditioning;Muscular Weakness;Other (comment)    Comments  R hip pain, numbness in feet    Cardiac Risk Stratification  High           6 Minute Walk: 6 Minute Walk    6 Minute Walk    Row Name 07/11/17 0924 07/11/17 1030 07/11/17 1042   Phase  Initial  no documentation  no documentation   Distance  1349 feet  no documentation  no documentation   Walk Time  6 minutes  no documentation  no  documentation   # of Rest Breaks  0  no documentation  no documentation   MPH  no documentation  no documentation  2.6   METS  no documentation  no documentation  3.4   RPE  11  no documentation  no documentation   VO2 Peak  no documentation  no documentation  12.03   Symptoms  Yes (comment)  no documentation  no documentation   Comments  R  hip/shin pain 8/10; numbness in feet, B  no documentation  no documentation   Resting HR  75 bpm  no documentation  no documentation   Resting BP  118/70  no documentation  no documentation   Resting Oxygen Saturation   98 %  no documentation  no documentation   Exercise Oxygen Saturation  during 6 min walk  100 %  no documentation  no documentation   Max Ex. HR  no documentation  118 bpm  no documentation   Max Ex. BP  128/60  no documentation  no documentation       6 Minute Walk    Row Name 07/11/17 1057   2 Minute Post BP  112/66          Oxygen Initial Assessment:   Oxygen Re-Evaluation:   Oxygen Discharge (Final Oxygen Re-Evaluation):   Initial Exercise Prescription: Initial Exercise Prescription - 07/11/17 1000    Date of Initial Exercise RX and Referring Provider          Date  07/11/17    Referring Provider  Bensimhon, Daniel MD        Recumbant Bike          Level  2    Minutes  10    METs  2        NuStep          Level  2    SPM  80    Minutes  15    METs  2        Arm Ergometer          Level  --    Minutes  --    METs  --        Track          Laps  4    Minutes  5    METs  --        Prescription Details          Frequency (times per week)  3    Duration  Progress to 30 minutes of continuous aerobic without signs/symptoms of physical distress        Intensity          THRR 40-80% of Max Heartrate  60-119    Ratings of Perceived Exertion  11-13    Perceived Dyspnea  0-4        Progression          Progression  Continue to progress workloads to maintain intensity without  signs/symptoms of physical distress.        Resistance Training          Training Prescription  Yes    Weight  2lbs    Reps  10-15           Perform Capillary Blood Glucose checks as needed.  Exercise Prescription Changes: Exercise Prescription Changes    Response to Exercise    Row Name 07/15/17 1416   Blood Pressure (Admit)  126/70   Blood Pressure (Exercise)  120/70   Blood Pressure (Exit)  110/62   Heart Rate (Admit)  92 bpm   Heart Rate (Exercise)  94 bpm   Heart Rate (Exit)  15 bpm   Rating of Perceived Exertion (Exercise)  11   Symptoms  none   Comments  pt was oriented to exercise equipment today   Duration  Continue with 30 min of aerobic exercise without signs/symptoms of physical distress.   Intensity  THRR unchanged       Progression    Row Name 07/15/17 1416   Progression  Continue to progress workloads to maintain intensity without signs/symptoms of physical distress.   Average METs  2.2       Resistance Training    Row Name 07/15/17 1416   Training Prescription  Yes   Weight  2lbs   Reps  10-15       [place] Name 07/15/17 1416   Level  2   Minutes  15   METs  2.74       Arm Ergometer    Row Name 07/15/17 1416   Level  2   Minutes  5   METs  2.74       Track    Row Name 07/15/17 1416   Laps  5   Minutes  5          Exercise Comments: Exercise Comments    Row Name 07/15/17 1705   Exercise Comments  Pt was oriented to exercise equipment today. Pt responded well to first exercise session; will continue to monitor activity levels and signs/symptoms of physical distress.      Exercise Goals and Review: Exercise Goals    Exercise Goals    Row Name 07/11/17 0926   Increase Physical Activity  Yes   Intervention  Provide advice, education, support and counseling about physical activity/exercise needs.;Develop an individualized exercise prescription for aerobic and resistive training based on initial evaluation  findings, risk stratification, comorbidities and participant's personal goals.   Expected Outcomes  Short Term: Attend rehab on a regular basis to increase amount of physical activity.;Long Term: Exercising regularly at least 3-5 days a week.;Long Term: Add in home exercise to make exercise part of routine and to increase amount of physical activity.   Increase Strength and Stamina  Yes return to fishing   Intervention  Provide advice, education, support and counseling about physical activity/exercise needs.;Develop an individualized exercise prescription for aerobic and resistive training based on initial evaluation findings, risk  stratification, comorbidities and participant's personal goals.   Expected Outcomes  Short Term: Increase workloads from initial exercise prescription for resistance, speed, and METs.;Short Term: Perform resistance training exercises routinely during rehab and add in resistance training at home;Long Term: Improve cardiorespiratory fitness, muscular endurance and strength as measured by increased METs and functional capacity (6MWT)   Able to understand and use rate of perceived exertion (RPE) scale  Yes   Intervention  Provide education and explanation on how to use RPE scale   Expected Outcomes  Short Term: Able to use RPE daily in rehab to express subjective intensity level;Long Term:  Able to use RPE to guide intensity level when exercising independently   Knowledge and understanding of Target Heart Rate Range (THRR)  Yes   Intervention  Provide education and explanation of THRR including how the numbers were predicted and where they are located for reference   Expected Outcomes  Short Term: Able to state/look up THRR;Long Term: Able to use THRR to govern intensity when exercising independently;Short Term: Able to use daily as guideline for intensity in rehab   Able to check pulse independently  Yes   Intervention  Provide education and demonstration on how to check pulse in  carotid and radial arteries.;Review the importance of being able to check your own pulse for safety during independent exercise   Expected Outcomes  Short Term: Able to explain why pulse checking is important during independent exercise;Long Term: Able to check pulse independently and accurately   Understanding of Exercise Prescription  Yes   Intervention  Provide education, explanation, and written materials on patient's individual exercise prescription   Expected Outcomes  Short Term: Able to explain program exercise prescription;Long Term: Able to explain home exercise prescription to exercise independently          Exercise Goals Re-Evaluation : Exercise Goals Re-Evaluation    Exercise Goal Re-Evaluation    Row Name 07/15/17 1707   Exercise Goals Review  Increase Physical Activity;Able to understand and use rate of perceived exertion (RPE) scale   Comments  Pt has a great understanding of RPE scale and was able to exercise for 30 minutes without difficulty.   Expected Outcomes  Pt will be improve in cardiorespiratory fitness and functional mobility.            Discharge Exercise Prescription (Final Exercise Prescription Changes): Exercise Prescription Changes - 07/15/17 1416    Response to Exercise          Blood Pressure (Admit)  126/70    Blood Pressure (Exercise)  120/70    Blood Pressure (Exit)  110/62    Heart Rate (Admit)  92 bpm    Heart Rate (Exercise)  94 bpm    Heart Rate (Exit)  15 bpm    Rating of Perceived Exertion (Exercise)  11    Symptoms  none    Comments  pt was oriented to exercise equipment today    Duration  Continue with 30 min of aerobic exercise without signs/symptoms of physical distress.    Intensity  THRR unchanged        Progression          Progression  Continue to progress workloads to maintain intensity without signs/symptoms of physical distress.    Average METs  2.2        Resistance Training          Training Prescription  Yes     Weight  2lbs    Reps  10-15  Recumbant Bike          Level  2    Minutes  15    METs  2.74        Arm Ergometer          Level  2    Minutes  5    METs  2.74        Track          Laps  5    Minutes  5           Nutrition:  Target Goals: Understanding of nutrition guidelines, daily intake of sodium <1555m, cholesterol <2031m calories 30% from fat and 7% or less from saturated fats, daily to have 5 or more servings of fruits and vegetables.  Biometrics: Pre Biometrics - 07/11/17 1142    Pre Biometrics          Height  6' 1" (1.854 m)    Weight  179 lb 3.7 oz (81.3 kg)    Waist Circumference  37.75 inches    Hip Circumference  41 inches    Waist to Hip Ratio  0.92 %    BMI (Calculated)  23.65    Triceps Skinfold  13 mm    % Body Fat  24.2 %    Grip Strength  42 kg    Flexibility  9 in    Single Leg Stand  10.41 seconds            Nutrition Therapy Plan and Nutrition Goals: Nutrition Therapy & Goals - 07/11/17 1126    Nutrition Therapy          Diet  Carb Modified, Heart Healthy        Personal Nutrition Goals          Nutrition Goal  Pt to identify and limit food sources of saturated fat, trans fat, and sodium    Personal Goal #2  Pt to describe the benefit of including fruits, vegetables, whole grains, and low-fat dairy products in a heart healthy meal plan.        Intervention Plan          Intervention  Prescribe, educate and counsel regarding individualized specific dietary modifications aiming towards targeted core components such as weight, hypertension, lipid management, diabetes, heart failure and other comorbidities.    Expected Outcomes  Short Term Goal: Understand basic principles of dietary content, such as calories, fat, sodium, cholesterol and nutrients.;Long Term Goal: Adherence to prescribed nutrition plan.           Nutrition Assessments: Nutrition Assessments - 07/11/17 1126    MEDFICTS Scores          Pre Score  62            Nutrition Goals Re-Evaluation:   Nutrition Goals Re-Evaluation:   Nutrition Goals Discharge (Final Nutrition Goals Re-Evaluation):   Psychosocial: Target Goals: Acknowledge presence or absence of significant depression and/or stress, maximize coping skills, provide positive support system. Participant is able to verbalize types and ability to use techniques and skills needed for reducing stress and depression.  Initial Review & Psychosocial Screening: Initial Psych Review & Screening - 07/11/17 1137    Initial Review          Current issues with  None Identified        Family Dynamics          Good Support System?  Yes Patient has his wife for support  Barriers          Psychosocial barriers to participate in program  There are no identifiable barriers or psychosocial needs.        Screening Interventions          Interventions  Encouraged to exercise           Quality of Life Scores: Quality of Life - 07/11/17 1037    Quality of Life Scores          Health/Function Pre  21.4 %    Socioeconomic Pre  27.08 %    Psych/Spiritual Pre  23.33 %    Family Pre  27.3 %    GLOBAL Pre  23.75 %          Scores of 19 and below usually indicate a poorer quality of life in these areas.  A difference of  2-3 points is a clinically meaningful difference.  A difference of 2-3 points in the total score of the Quality of Life Index has been associated with significant improvement in overall quality of life, self-image, physical symptoms, and general health in studies assessing change in quality of life.  PHQ-9: Recent Review Flowsheet Data    Depression screen [facility] 2/9 07/15/2017   Decreased Interest 0   Down, Depressed, Hopeless 0   PHQ - 2 Score 0     Interpretation of Total Score  Total Score Depression Severity:  1-4 = Minimal depression, 5-9 = Mild depression, 10-14 = Moderate depression, 15-19 = Moderately severe depression, 20-27 = Severe depression    Psychosocial Evaluation and Intervention: Psychosocial Evaluation - 07/15/17 1622    Psychosocial Evaluation & Interventions          Interventions  Encouraged to exercise with the program and follow exercise prescription;Stress management education;Relaxation education    Comments  No psychosocial needs identified. No followup needed at this time. Pt enjoys fishing.     Expected Outcomes  Pt will exhibit a positive outlook and good coping skills.     Continue Psychosocial Services   No Follow up required           Psychosocial Re-Evaluation: Psychosocial Re-Evaluation    Psychosocial Re-Evaluation    [place] Name 07/18/17 1129   Current issues with  None Identified   Comments  no psychosocial needs identified, no interventions necessary.    Expected Outcomes  pt will exhibit positive outlook with good coping skills.    Interventions  Encouraged to attend Cardiac Rehabilitation for the exercise   Continue Psychosocial Services   No Follow up required          Psychosocial Discharge (Final Psychosocial Re-Evaluation): Psychosocial Re-Evaluation - 07/18/17 1129    Psychosocial Re-Evaluation          Current issues with  None Identified    Comments  no psychosocial needs identified, no interventions necessary.     Expected Outcomes  pt will exhibit positive outlook with good coping skills.     Interventions  Encouraged to attend Cardiac Rehabilitation for the exercise    Continue Psychosocial Services   No Follow up required           Vocational Rehabilitation: Provide vocational rehab assistance to qualifying candidates.   Vocational Rehab Evaluation & Intervention: Vocational Rehab - 07/11/17 1137    Initial Vocational Rehab Evaluation & Intervention          Assessment shows need for Vocational Rehabilitation  No  Education: Education Goals: Education classes will be provided on a weekly basis, covering required topics. Participant will state  understanding/return demonstration of topics presented.  Learning Barriers/Preferences: Learning Barriers/Preferences - 07/11/17 7741    Learning Barriers/Preferences          Learning Barriers  Sight    Learning Preferences  Written Material;Pictoral;Video           Education Topics: Count Your Pulse:  -Group instruction provided by verbal instruction, demonstration, patient participation and written materials to support subject.  Instructors address importance of being able to find your pulse and how to count your pulse when at home without a heart monitor.  Patients get hands on experience counting their pulse with staff help and individually.   Heart Attack, Angina, and Risk Factor Modification:  -Group instruction provided by verbal instruction, video, and written materials to support subject.  Instructors address signs and symptoms of angina and heart attacks.    Also discuss risk factors for heart disease and how to make changes to improve heart health risk factors.   Functional Fitness:  -Group instruction provided by verbal instruction, demonstration, patient participation, and written materials to support subject.  Instructors address safety measures for doing things around the house.  Discuss how to get up and down off the floor, how to pick things up properly, how to safely get out of a chair without assistance, and balance training.   Meditation and Mindfulness:  -Group instruction provided by verbal instruction, patient participation, and written materials to support subject.  Instructor addresses importance of mindfulness and meditation practice to help reduce stress and improve awareness.  Instructor also leads participants through a meditation exercise.    Stretching for Flexibility and Mobility:  -Group instruction provided by verbal instruction, patient participation, and written materials to support subject.  Instructors lead participants through series of stretches  that are designed to increase flexibility thus improving mobility.  These stretches are additional exercise for major muscle groups that are typically performed during regular warm up and cool down.   Hands Only CPR:  -Group verbal, video, and participation provides a basic overview of AHA guidelines for community CPR. Role-play of emergencies allow participants the opportunity to practice calling for help and chest compression technique with discussion of AED use.   Hypertension: -Group verbal and written instruction that provides a basic overview of hypertension including the most recent diagnostic guidelines, risk factor reduction with self-care instructions and medication management.    Nutrition I class: Heart Healthy Eating:  -Group instruction provided by PowerPoint slides, verbal discussion, and written materials to support subject matter. The instructor gives an explanation and review of the Therapeutic Lifestyle Changes diet recommendations, which includes a discussion on lipid goals, dietary fat, sodium, fiber, plant stanol/sterol esters, sugar, and the components of a well-balanced, healthy diet.   Nutrition II class: Lifestyle Skills:  -Group instruction provided by PowerPoint slides, verbal discussion, and written materials to support subject matter. The instructor gives an explanation and review of label reading, grocery shopping for heart health, heart healthy recipe modifications, and ways to make healthier choices when eating out.   Diabetes Question & Answer:  -Group instruction provided by PowerPoint slides, verbal discussion, and written materials to support subject matter. The instructor gives an explanation and review of diabetes co-morbidities, pre- and post-prandial blood glucose goals, pre-exercise blood glucose goals, signs, symptoms, and treatment of hypoglycemia and hyperglycemia, and foot care basics.   Diabetes Blitz:  -Group instruction provided by PowerPoint  slides, verbal discussion, and written materials to support subject matter. The instructor gives an explanation and review of the physiology behind type 1 and type 2 diabetes, diabetes medications and rational behind using different medications, pre- and post-prandial blood glucose recommendations and Hemoglobin A1c goals, diabetes diet, and exercise including blood glucose guidelines for exercising safely.    Portion Distortion:  -Group instruction provided by PowerPoint slides, verbal discussion, written materials, and food models to support subject matter. The instructor gives an explanation of serving size versus portion size, changes in portions sizes over the last 20 years, and what consists of a serving from each food group.   Stress Management:  -Group instruction provided by verbal instruction, video, and written materials to support subject matter.  Instructors review role of stress in heart disease and how to cope with stress positively.     Exercising on Your Own:  -Group instruction provided by verbal instruction, power point, and written materials to support subject.  Instructors discuss benefits of exercise, components of exercise, frequency and intensity of exercise, and end points for exercise.  Also discuss use of nitroglycerin and activating EMS.  Review options of places to exercise outside of rehab.  Review guidelines for sex with heart disease.   Cardiac Drugs I:  -Group instruction provided by verbal instruction and written materials to support subject.  Instructor reviews cardiac drug classes: antiplatelets, anticoagulants, beta blockers, and statins.  Instructor discusses reasons, side effects, and lifestyle considerations for each drug class.   Cardiac Drugs II:  -Group instruction provided by verbal instruction and written materials to support subject.  Instructor reviews cardiac drug classes: angiotensin converting enzyme inhibitors (ACE-I), angiotensin II receptor  blockers (ARBs), nitrates, and calcium channel blockers.  Instructor discusses reasons, side effects, and lifestyle considerations for each drug class.   Anatomy and Physiology of the Circulatory System:  Group verbal and written instruction and models provide basic cardiac anatomy and physiology, with the coronary electrical and arterial systems. Review of: AMI, Angina, Valve disease, Heart Failure, Peripheral Artery Disease, Cardiac Arrhythmia, Pacemakers, and the ICD.   Other Education:  -Group or individual verbal, written, or video instructions that support the educational goals of the cardiac rehab program.   Holiday Eating Survival Tips:  -Group instruction provided by PowerPoint slides, verbal discussion, and written materials to support subject matter. The instructor gives patients tips, tricks, and techniques to help them not only survive but enjoy the holidays despite the onslaught of food that accompanies the holidays.   Knowledge Questionnaire Score: Knowledge Questionnaire Score - 07/11/17 1028    Knowledge Questionnaire Score          Pre Score  18/24           Core Components/Risk Factors/Patient Goals at Admission: Personal Goals and Risk Factors at Admission - 07/11/17 1043    Core Components/Risk Factors/Patient Goals on Admission          Improve shortness of breath with ADL's  Yes    Intervention  Provide education, individualized exercise plan and daily activity instruction to help decrease symptoms of SOB with activities of daily living.    Expected Outcomes  Long Term: Be able to perform more ADLs without symptoms or delay the onset of symptoms;Short Term: Improve cardiorespiratory fitness to achieve a reduction of symptoms when performing ADLs    Diabetes  Yes    Intervention  Provide education about signs/symptoms and action to take for hypo/hyperglycemia.;Provide education about proper nutrition, including hydration, and aerobic/resistive exercise  prescription along with prescribed medications to achieve blood glucose in normal ranges: Fasting glucose 65-99 mg/dL    Expected Outcomes  Short Term: Participant verbalizes understanding of the signs/symptoms and immediate care of hyper/hypoglycemia, proper foot care and importance of medication, aerobic/resistive exercise and nutrition plan for blood glucose control.;Long Term: Attainment of HbA1C < 7%.    Heart Failure  Yes    Intervention  Provide a combined exercise and nutrition program that is supplemented with education, support and counseling about heart failure. Directed toward relieving symptoms such as shortness of breath, decreased exercise tolerance, and extremity edema.    Expected Outcomes  Improve functional capacity of life;Short term: Attendance in program 2-3 days a week with increased exercise capacity. Reported lower sodium intake. Reported increased fruit and vegetable intake. Reports medication compliance.;Long term: Adoption of self-care skills and reduction of barriers for early signs and symptoms recognition and intervention leading to self-care maintenance.;Short term: Daily weights obtained and reported for increase. Utilizing diuretic protocols set by physician.    Hypertension  Yes    Intervention  Provide education on lifestyle modifcations including regular physical activity/exercise, weight management, moderate sodium restriction and increased consumption of fresh fruit, vegetables, and low fat dairy, alcohol moderation, and smoking cessation.;Monitor prescription use compliance.    Expected Outcomes  Short Term: Continued assessment and intervention until BP is < 140/26m HG in hypertensive participants. < 130/875mHG in hypertensive participants with diabetes, heart failure or chronic kidney disease.;Long Term: Maintenance of blood pressure at goal levels.    Lipids  Yes    Intervention  Provide education and support for participant on nutrition & aerobic/resistive  exercise along with prescribed medications to achieve LDL <7037mHDL >87m16m  Expected Outcomes  Short Term: Participant states understanding of desired cholesterol values and is compliant with medications prescribed. Participant is following exercise prescription and nutrition guidelines.;Long Term: Cholesterol controlled with medications as prescribed, with individualized exercise RX and with personalized nutrition plan. Value goals: LDL < 70mg53mL > 40 mg.           Core Components/Risk Factors/Patient Goals Review:  Goals and Risk Factor Review    Core Components/Risk Factors/Patient Goals Review    Row Name 07/15/17 1615   Personal Goals Review  Heart Failure;Hypertension;Lipids;Diabetes   Review  Pt with multiple CAD RF. Pt is eager to participate in CR exercises.   Expected Outcomes  Pt will participate in CR exercises and educational opportunities about nutrition, exercise, and lifestyle modifications.           Core Components/Risk Factors/Patient Goals at Discharge (Final Review):  Goals and Risk Factor Review - 07/15/17 1615    Core Components/Risk Factors/Patient Goals Review          Personal Goals Review  Heart Failure;Hypertension;Lipids;Diabetes    Review  Pt with multiple CAD RF. Pt is eager to participate in CR exercises.    Expected Outcomes  Pt will participate in CR exercises and educational opportunities about nutrition, exercise, and lifestyle modifications.            ITP Comments: ITP Comments    Row Name 07/11/17 0921 [id number]   ITP Comments  Dr. TraciFransico Himical Director  30 day ITP  review. pt demonstrates willingness to participate in CR group exercise program.       Comments:

## 2017-07-19 ENCOUNTER — Encounter (HOSPITAL_COMMUNITY): Payer: Medicare Other

## 2017-07-22 ENCOUNTER — Encounter (HOSPITAL_COMMUNITY)
Admission: RE | Admit: 2017-07-22 | Discharge: 2017-07-22 | Disposition: A | Discharge status: Home / Self Care | Payer: Medicare Other | Source: Ambulatory Visit | Attending: Internal Medicine | Admitting: Internal Medicine

## 2017-07-22 DIAGNOSIS — I213 ST elevation (STEMI) myocardial infarction of unspecified site: Secondary | ICD-10-CM

## 2017-07-22 DIAGNOSIS — D649 Anemia, unspecified: Secondary | ICD-10-CM | POA: Diagnosis not present

## 2017-07-22 DIAGNOSIS — Z951 Presence of aortocoronary bypass graft: Secondary | ICD-10-CM

## 2017-07-22 LAB — GLUCOSE, CAPILLARY
GLUCOSE-CAPILLARY: 107 mg/dL — AB (ref 65–99)
Glucose-Capillary: 84 mg/dL (ref 65–99)

## 2017-07-24 ENCOUNTER — Encounter (HOSPITAL_COMMUNITY)
Admission: RE | Admit: 2017-07-24 | Discharge: 2017-07-24 | Disposition: A | Discharge status: Home / Self Care | Payer: Medicare Other | Source: Ambulatory Visit | Attending: Internal Medicine | Admitting: Internal Medicine

## 2017-07-24 DIAGNOSIS — I213 ST elevation (STEMI) myocardial infarction of unspecified site: Secondary | ICD-10-CM

## 2017-07-24 DIAGNOSIS — Z951 Presence of aortocoronary bypass graft: Secondary | ICD-10-CM

## 2017-07-24 DIAGNOSIS — D649 Anemia, unspecified: Secondary | ICD-10-CM | POA: Diagnosis not present

## 2017-07-24 LAB — GLUCOSE, CAPILLARY
GLUCOSE-CAPILLARY: 120 mg/dL — AB (ref 65–99)
Glucose-Capillary: 118 mg/dL — ABNORMAL HIGH (ref 65–99)

## 2017-07-24 NOTE — Progress Notes (Signed)
[name] [name] 72 y.o. male DOB: Oct 25, 1945 MRN: [id number]      Nutrition Note  1. ST elevation myocardial infarction (STEMI), unspecified artery ([place]) 03/31/17   2. 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 Actoplus Met noted  Labs:   Lab Results  Component Value Date   HGBA1C 6.3 (H) 03/31/2017   CBG (last 3)  Recent Labs    07/22/17 1325 07/22/17 1423 07/24/17 1324  GLUCAP 107* 84 120*    Nutrition Note Spoke with pt per RN request. Pt reports he ate a candy bar before exercise on Monday. Pt CBG dropped with exercise. Per discussion, pt ate peanut butter crackers and chocolate milk before exercise today. Post-exercise CBG today reportedly 118 mg/dL. Improved post-exercise CBG's with improved pre-exercise snack choice. Alternative pre-exercise snack choices discussed. Jiles Garter, RN plans on giving pt a handout re: DM and Exercise guidelines. Pt states his MD has changed his DM medication but he has not received the medication in the mail at this time. Pt expressed understanding of the information reviewed.   Nutrition Diagnosis ? Food-and nutrition-related knowledge deficit related to lack of exposure to information as related to diagnosis of: ? CVD ? DM  Nutrition Intervention ? Pt's individual nutrition plan and goals reviewed with pt.  Nutrition Goal(s):  ? Pt to identify and limit food sources of saturated fat, trans fat, and sodium ? Pt to describe the benefit of including fruits, vegetables, whole grains, and low-fat dairy products in a heart healthy meal plan.  Plan:  Pt to attend nutrition classes ? Nutrition I ? Nutrition II ? Portion Distortion ? Diabetes Blitz ? Diabetes Q & A Will provide client-centered nutrition education as part of interdisciplinary care.   Monitor and evaluate progress toward nutrition goal with team.  Derek Mound, M.Ed, RD, LDN, CDE 07/24/2017 2:27 PM

## 2017-07-25 ENCOUNTER — Telehealth (HOSPITAL_COMMUNITY): Payer: Self-pay | Admitting: Vascular Surgery

## 2017-07-25 NOTE — Telephone Encounter (Signed)
Left pt message to move up appt w/ db from 4/11 to 4/2 @ 11 , asked pt to call back to confirm appt time

## 2017-07-26 ENCOUNTER — Encounter (HOSPITAL_COMMUNITY)
Admission: RE | Admit: 2017-07-26 | Discharge: 2017-07-26 | Disposition: A | Discharge status: Home / Self Care | Payer: Medicare Other | Source: Ambulatory Visit | Attending: Internal Medicine | Admitting: Internal Medicine

## 2017-07-26 DIAGNOSIS — Z951 Presence of aortocoronary bypass graft: Secondary | ICD-10-CM

## 2017-07-26 DIAGNOSIS — I213 ST elevation (STEMI) myocardial infarction of unspecified site: Secondary | ICD-10-CM

## 2017-07-26 DIAGNOSIS — D649 Anemia, unspecified: Secondary | ICD-10-CM | POA: Diagnosis not present

## 2017-07-26 LAB — GLUCOSE, CAPILLARY: Glucose-Capillary: 85 mg/dL (ref 65–99)

## 2017-07-29 ENCOUNTER — Encounter (HOSPITAL_COMMUNITY)
Admission: RE | Admit: 2017-07-29 | Discharge: 2017-07-29 | Disposition: A | Discharge status: Home / Self Care | Payer: Medicare Other | Source: Ambulatory Visit | Attending: Internal Medicine | Admitting: Internal Medicine

## 2017-07-29 DIAGNOSIS — D649 Anemia, unspecified: Secondary | ICD-10-CM | POA: Insufficient documentation

## 2017-07-29 DIAGNOSIS — I252 Old myocardial infarction: Secondary | ICD-10-CM | POA: Insufficient documentation

## 2017-07-29 DIAGNOSIS — I213 ST elevation (STEMI) myocardial infarction of unspecified site: Secondary | ICD-10-CM

## 2017-07-29 DIAGNOSIS — Z951 Presence of aortocoronary bypass graft: Secondary | ICD-10-CM | POA: Insufficient documentation

## 2017-07-29 LAB — GLUCOSE, CAPILLARY
GLUCOSE-CAPILLARY: 121 mg/dL — AB (ref 65–99)
Glucose-Capillary: 118 mg/dL — ABNORMAL HIGH (ref 65–99)
Glucose-Capillary: 99 mg/dL (ref 65–99)

## 2017-07-29 NOTE — Progress Notes (Signed)
Reviewed home exercise with pt today.  Pt plans to walk for exercise for 10' 2-3x/week. Discussed exercise/activity progression. Reviewed THR, pulse, RPE, sign and symptoms,  and when to call 911 or MD.  Also discussed weather considerations and indoor options.  Pt voiced understanding.   Valda Christenson [employer]

## 2017-07-30 ENCOUNTER — Ambulatory Visit (HOSPITAL_COMMUNITY)
Admission: RE | Admit: 2017-07-30 | Discharge: 2017-07-30 | Disposition: A | Discharge status: Home / Self Care | Payer: Medicare Other | Source: Ambulatory Visit | Attending: Internal Medicine | Admitting: Internal Medicine

## 2017-07-30 VITALS — BP 122/66 | HR 85 | Wt 182.1 lb

## 2017-07-30 DIAGNOSIS — Z7982 Long term (current) use of aspirin: Secondary | ICD-10-CM | POA: Diagnosis not present

## 2017-07-30 DIAGNOSIS — I5042 Chronic combined systolic (congestive) and diastolic (congestive) heart failure: Secondary | ICD-10-CM

## 2017-07-30 DIAGNOSIS — Z7984 Long term (current) use of oral hypoglycemic drugs: Secondary | ICD-10-CM | POA: Diagnosis not present

## 2017-07-30 DIAGNOSIS — Z8551 Personal history of malignant neoplasm of bladder: Secondary | ICD-10-CM | POA: Diagnosis not present

## 2017-07-30 DIAGNOSIS — I480 Paroxysmal atrial fibrillation: Secondary | ICD-10-CM | POA: Diagnosis not present

## 2017-07-30 DIAGNOSIS — Z79899 Other long term (current) drug therapy: Secondary | ICD-10-CM | POA: Insufficient documentation

## 2017-07-30 DIAGNOSIS — I2510 Atherosclerotic heart disease of native coronary artery without angina pectoris: Secondary | ICD-10-CM | POA: Diagnosis not present

## 2017-07-30 DIAGNOSIS — I5022 Chronic systolic (congestive) heart failure: Secondary | ICD-10-CM | POA: Insufficient documentation

## 2017-07-30 DIAGNOSIS — F17210 Nicotine dependence, cigarettes, uncomplicated: Secondary | ICD-10-CM | POA: Insufficient documentation

## 2017-07-30 DIAGNOSIS — E119 Type 2 diabetes mellitus without complications: Secondary | ICD-10-CM | POA: Diagnosis not present

## 2017-07-30 DIAGNOSIS — I110 Hypertensive heart disease with heart failure: Secondary | ICD-10-CM | POA: Diagnosis not present

## 2017-07-30 DIAGNOSIS — Z951 Presence of aortocoronary bypass graft: Secondary | ICD-10-CM | POA: Insufficient documentation

## 2017-07-30 MED ORDER — CARVEDILOL 3.125 MG PO TABS: 3.1250 mg | ORAL_TABLET | Freq: Two times a day (BID) | ORAL | 3 refills | Status: DC

## 2017-07-30 NOTE — Patient Instructions (Signed)
START taking carvedilol 3.125 mg (1 Tablet) Twice Daily  Echocardiogram next week  Follow up with Ileene Patrick PharmD next week  Follow up with Dr. Haroldine Laws in 2 months

## 2017-07-31 ENCOUNTER — Encounter (HOSPITAL_COMMUNITY)
Admission: RE | Admit: 2017-07-31 | Discharge: 2017-07-31 | Disposition: A | Discharge status: Home / Self Care | Payer: Medicare Other | Source: Ambulatory Visit | Attending: Internal Medicine | Admitting: Internal Medicine

## 2017-07-31 DIAGNOSIS — I213 ST elevation (STEMI) myocardial infarction of unspecified site: Secondary | ICD-10-CM

## 2017-07-31 DIAGNOSIS — Z951 Presence of aortocoronary bypass graft: Secondary | ICD-10-CM

## 2017-07-31 DIAGNOSIS — D649 Anemia, unspecified: Secondary | ICD-10-CM | POA: Diagnosis not present

## 2017-07-31 LAB — GLUCOSE, CAPILLARY: Glucose-Capillary: 130 mg/dL — ABNORMAL HIGH (ref 65–99)

## 2017-08-01 NOTE — Progress Notes (Signed)
Advanced Heart Failure Clinic Note   Primary Cardiologist: Dr. Haroldine Laws   HPI:  [name] [name] is a 72 y.o. 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   Pt admitted in 12/18 with anterior STEMI and developed cardiogenic shock requiring Impella support. Urgent cath showed severe 3v CAD as below. While on impella, pt developed sepsis of unclear source with WBC ~ 40K, though did have pseudomonas on his UCx. Treated for AECOPD and completed ABX therapy. BCx negative. Stabilized and underwent CABG x 2 with LIMA to LAD and SVG to OM on 04/08/2017. Pt transiently required milrinone but weaned prior to discharge. Required amiodarone for brief post op afib, but had no recurrence.   He presents today for regular follow up. Continues to improve. Feels that he is getting stronger and more endurance. Gainef a little weight back. Doing well with CR.  Denies CP. Mild DOE with strenuous activities. No palpitations. No dizziness or orthostasis. Compliant with all meds.    Review of systems complete and found to be negative unless listed in HPI.    [facility] 03/31/2017  Prox RCA lesion is 100% stenosed. Dist RCA lesion is 100% stenosed (retrograde filling from collaterals does not go beyond the distal vessel) -->faint collateral filling to the distal PL and PDA from septal perforators and a diffusely diseased AV groove circumflex  LAV Groove lesion is 90% stenosed.  Prox LAD-1 lesion is 95% stenosed. Prox LAD-2 lesion is 85% stenosed. Heavily calcified, tandem lesions in very tortuous segment of the vessel.  Prox Cx to Mid Cx lesion is 70% stenosed. Heavily calcified  There is severe left ventricular systolic dysfunction. The left ventricular ejection fraction is less than 25% by visual estimate.  LV end diastolic pressure is severely  elevated -prior to Impella insertion  Hemodynamic findings consistent with moderate pulmonary hypertension.  Successful Impella insertion with 3.5-3.6 LPM flow's  Echo 04/01/2017 EF 15-20%  Echo 04/16/17 (post op) Improved to 25-30% on 04/16/17  Past Medical History:  Diagnosis Date  . Bladder cancer ([place])   . Borderline glaucoma   . CHF (congestive heart failure) ([place])   . Coronary artery disease   . Diabetes mellitus type 2, controlled ([place]) [facility]  . Essential hypertension     Current Outpatient Medications  Medication Sig Dispense Refill  . aspirin EC 325 MG EC tablet Take 1 tablet (325 mg total) by mouth daily.    [name] atorvastatin (LIPITOR) 80 MG tablet Take 1 tablet (80 mg total) by mouth at bedtime. 30 tablet 1  . digoxin (LANOXIN) 0.125 MG tablet Take 0.5 tablets (0.0625 mg total) by mouth daily. 45 tablet 3  . losartan (COZAAR) 25 MG tablet Take 0.5 tablets (12.5 mg total) by mouth 2 (two) times daily. 30 tablet 5  . metFORMIN (GLUCOPHAGE) 500 MG tablet Take 500 mg by mouth daily with breakfast.    . mometasone (NASONEX) 50 MCG/ACT nasal spray Place 1-2 sprays into the nose daily as needed for allergies.    [name] spironolactone (ALDACTONE) 25 MG tablet Take 1 tablet (25 mg total) by mouth daily. 30 tablet 3  . carvedilol (COREG) 3.125 MG tablet Take 1 tablet (3.125 mg total) by mouth 2 (two) times daily. 60 tablet 3   No current facility-administered medications for this encounter.     No  Known Allergies    Social History   Socioeconomic History  . Marital status: Married    Spouse name: Not on file  . Number of children: Not on file  . Years of education: Not on file  . Highest education level: Not on file  Occupational History  . Not on file  Social Needs  . Financial resource strain: Not on file  . Food insecurity:    Worry: Not on file    Inability: Not on file  . Transportation needs:    Medical: Not on file    Non-medical: Not on file  Tobacco Use  .  Smoking status: Former Smoker    Packs/day: 1.00    Years: 40.00    Pack years: 40.00    Types: Cigarettes    Last attempt to quit: 03/31/2017    Years since quitting: 0.3  . Smokeless tobacco: Never Used  Substance and Sexual Activity  . Alcohol use: No  . Drug use: No  . Sexual activity: Not on file  Lifestyle  . Physical activity:    Days per week: 0 days    Minutes per session: 0 min  . Stress: Only a little  Relationships  . Social connections:    Talks on phone: Not on file    Gets together: Not on file    Attends religious service: Not on file    Active member of club or organization: Not on file    Attends meetings of clubs or organizations: Not on file    Relationship status: Not on file  . Intimate partner violence:    Fear of current or ex partner: Not on file    Emotionally abused: Not on file    Physically abused: Not on file    Forced sexual activity: Not on file  Other Topics Concern  . Not on file  Social History Narrative  . Not on file   Family History  Problem Relation Age of Onset  . Heart attack Sister   . Heart disease Sister     Vitals:   07/30/17 1112  BP: 122/66  Pulse: 85  SpO2: 98%  Weight: 182 lb 2 oz (82.6 kg)   Wt Readings from Last 3 Encounters:  07/30/17 182 lb 2 oz (82.6 kg)  07/11/17 179 lb 3.7 oz (81.3 kg)  07/09/17 179 lb (81.2 kg)    PHYSICAL EXAM: General:  Well appearing. No resp difficulty HEENT: normal Neck: supple. no JVD. Carotids 2+ bilat; no bruits. No lymphadenopathy or thryomegaly appreciated. Cor: PMI nondisplaced. Regular rate & rhythm. No rubs, gallops or murmurs. Lungs: clear with decreased BS throughout. No wheeze Abdomen: soft, nontender, nondistended. No hepatosplenomegaly. No bruits or masses. Good bowel sounds. Extremities: no cyanosis, clubbing, rash, edema Neuro: alert & orientedx3, cranial nerves grossly intact. moves all 4 extremities w/o difficulty. Affect pleasant   ASSESSMENT & PLAN:  1.  Chronic systolic CHF due to ICM  - EF 04/01/17 EF 15-20% -> Improved to 25-30% on 04/16/17 Post op CABG as below. Bedside echo at last visit. EF appears ~30% with HK apex. - Improving. NYHA II. Continue CR.  - Volume status looks good on exam. Does not need lasix.  - Start low-dose carvedilol 3.125 bid - Continue digoxin 0.0625. Level stable last check.  - Continue spironolactone 25 mg daily. BMET today.  - Continue losartan 12.5 mg BID. - Repeat echo to reassess EF and need for ICD - Refer to PharmD Clinic for ongoing med titration as  BP tolerates.   2. CAD - s/p CABG with LIMA to LAD and SVG to OM on 04/08/2017 - No s/s ischemia - Continue ASA/statin  3. Tobacco abuse: Smoked 1 ppd PTA for > 50 years.  - Remains quit  4. Hx of bladder CA:  - s/p Multiple rounds of BCG therapy. Stable.   5. Post op Atrial fibrillation - Maintaining NSR. No evidence of recurrence. Off amio    Glori Bickers, MD 08/01/17

## 2017-08-02 ENCOUNTER — Encounter (HOSPITAL_COMMUNITY)
Admission: RE | Admit: 2017-08-02 | Discharge: 2017-08-02 | Disposition: A | Discharge status: Home / Self Care | Payer: Medicare Other | Source: Ambulatory Visit | Attending: Internal Medicine | Admitting: Internal Medicine

## 2017-08-02 DIAGNOSIS — I213 ST elevation (STEMI) myocardial infarction of unspecified site: Secondary | ICD-10-CM

## 2017-08-02 DIAGNOSIS — D649 Anemia, unspecified: Secondary | ICD-10-CM | POA: Diagnosis not present

## 2017-08-02 DIAGNOSIS — Z951 Presence of aortocoronary bypass graft: Secondary | ICD-10-CM

## 2017-08-03 LAB — GLUCOSE, CAPILLARY: Glucose-Capillary: 109 mg/dL — ABNORMAL HIGH (ref 65–99)

## 2017-08-05 ENCOUNTER — Other Ambulatory Visit (HOSPITAL_COMMUNITY): Payer: Medicare Other

## 2017-08-05 ENCOUNTER — Ambulatory Visit (HOSPITAL_COMMUNITY)
Admission: RE | Admit: 2017-08-05 | Discharge: 2017-08-05 | Disposition: A | Discharge status: Home / Self Care | Payer: Medicare Other | Source: Ambulatory Visit | Attending: Cardiology | Admitting: Cardiology

## 2017-08-05 ENCOUNTER — Encounter (HOSPITAL_COMMUNITY)
Admission: RE | Admit: 2017-08-05 | Discharge: 2017-08-05 | Disposition: A | Discharge status: Home / Self Care | Payer: Medicare Other | Source: Ambulatory Visit | Attending: Internal Medicine | Admitting: Internal Medicine

## 2017-08-05 ENCOUNTER — Other Ambulatory Visit (HOSPITAL_COMMUNITY): Payer: Self-pay | Admitting: Pharmacist

## 2017-08-05 VITALS — BP 121/70 | HR 51 | Wt 181.2 lb

## 2017-08-05 DIAGNOSIS — I5042 Chronic combined systolic (congestive) and diastolic (congestive) heart failure: Secondary | ICD-10-CM

## 2017-08-05 DIAGNOSIS — E119 Type 2 diabetes mellitus without complications: Secondary | ICD-10-CM | POA: Insufficient documentation

## 2017-08-05 DIAGNOSIS — I2510 Atherosclerotic heart disease of native coronary artery without angina pectoris: Secondary | ICD-10-CM | POA: Insufficient documentation

## 2017-08-05 DIAGNOSIS — I110 Hypertensive heart disease with heart failure: Secondary | ICD-10-CM | POA: Insufficient documentation

## 2017-08-05 DIAGNOSIS — I213 ST elevation (STEMI) myocardial infarction of unspecified site: Secondary | ICD-10-CM

## 2017-08-05 DIAGNOSIS — I5022 Chronic systolic (congestive) heart failure: Secondary | ICD-10-CM | POA: Insufficient documentation

## 2017-08-05 DIAGNOSIS — Z87891 Personal history of nicotine dependence: Secondary | ICD-10-CM | POA: Diagnosis not present

## 2017-08-05 DIAGNOSIS — Z8551 Personal history of malignant neoplasm of bladder: Secondary | ICD-10-CM | POA: Insufficient documentation

## 2017-08-05 DIAGNOSIS — D649 Anemia, unspecified: Secondary | ICD-10-CM | POA: Diagnosis not present

## 2017-08-05 DIAGNOSIS — D509 Iron deficiency anemia, unspecified: Secondary | ICD-10-CM

## 2017-08-05 LAB — BASIC METABOLIC PANEL
Anion gap: 11 (ref 5–15)
BUN: 20 mg/dL (ref 6–20)
CHLORIDE: 106 mmol/L (ref 101–111)
CO2: 22 mmol/L (ref 22–32)
Calcium: 9.3 mg/dL (ref 8.9–10.3)
Creatinine, Ser: 1.29 mg/dL — ABNORMAL HIGH (ref 0.61–1.24)
GFR calc Af Amer: >60 mL/min (ref >60)
GFR calc non Af Amer: 54 mL/min — ABNORMAL LOW (ref >60)
GLUCOSE: 101 mg/dL — AB (ref 65–99)
POTASSIUM: 4.4 mmol/L (ref 3.5–5.1)
Sodium: 139 mmol/L (ref 135–145)

## 2017-08-05 LAB — FERRITIN: FERRITIN: 284 ng/mL (ref 24–336)

## 2017-08-05 LAB — GLUCOSE, CAPILLARY: Glucose-Capillary: 131 mg/dL — ABNORMAL HIGH (ref 65–99)

## 2017-08-05 LAB — IRON AND TIBC
IRON: 87 ug/dL (ref 45–182)
Saturation Ratios: 33 % (ref 17.9–39.5)
TIBC: 265 ug/dL (ref 250–450)
UIBC: 178 ug/dL

## 2017-08-05 NOTE — Patient Instructions (Signed)
It was great to meet you.  Please return to clinic for a blood pressure check on 4/22 at 2:45p.

## 2017-08-05 NOTE — Progress Notes (Signed)
HF MD: Dr. Haroldine Laws  HPI:  [name] Hagerman Padgettis a 72 y.o. Caucasianmalewith h/o DM2, COPD (quit smoking 12/18), severe 3v CAD s/p CABG 09/07/209, chronic systolic HF due to ICM (EF 25-30% on echo 12/18), HTN, DM2, h/o bladder CA s/p multiple rounds of BCG therapy, and paroxysmal AF.   Pt admitted in 12/18 with anterior STEMI and developed cardiogenic shock requiring Impella support. Urgent cath showed severe 3v CAD as below. While on impella, pt developed sepsis of unclear source with WBC ~ 40K, though did have pseudomonason his UCx. Treated for AECOPD and completed ABX therapy. BCx negative. Stabilized and underwent CABG x 2 with LIMA to LAD and SVG to OM on 04/08/2017. Pt transiently required milrinone but weaned prior to discharge. Required amiodarone for brief post op afib, but had no recurrence.   Pt presents today for pharmacist-led HF medication titration. At his last MD visit on 07/30/17, carvedilol 3.125mg  BID was started. Pt endorses compliance and denies AE since starting carvedilol. He notes that his BP has been lower at cardiac rehab visits since starting carvedilol. Pt denies orthostasis but does endorse some occasional lightheadedness, which has been an issue uptitrating HF medications in the past.    Shortness of breath/dyspnea on exertion? no   Orthopnea/PND? no  Edema? Yes - 1+ edema (per pt, this has been stable for months)  Lightheadedness/dizziness? Occasional, lightheaded today after exercise  Daily weights at home? Yes - 178-179 lbs mostly  Blood pressure/heart rate monitoring at home? No - checks 3x/week at cardiac rehab  Following low-sodium/fluid-restricted diet? No - continues to add Na to food. Provided education on limiting sodium.  HF Medications: -Carvedilol 3.125mg  BID -Digoxin 62.57mcg daily -Losartan 12.5mg  BID -Spironolactone 25mg  daily  Has the patient been experiencing any side effects to the medications prescribed?  no  Does the patient  have any problems obtaining medications due to transportation or finances?   no  Understanding of regimen: good Understanding of indications: good Potential of compliance: good Patient understands to avoid NSAIDs. Patient understands to avoid decongestants.   Pertinent Lab Values:  08/05/17: Serum creatinine 1.29 (BL 1.3-1.5), BUN 20, Potassium 4.4, Sodium 139  05/23/17: Digoxin: 0.5 ng/ml  Vital Signs:  Weight: 181 lbs (dry weight: ~179 lbs)  Blood pressure: 121/70 mmHg   Heart rate: 51 bpm   Assessment: 1. Chronicsystolic CHF (EF 17-35%), due to CAD. NYHA class IIsymptoms. - Mild L ankle edema on physical exam today, likely 2/2 cardiac rehab this afternoon - no overt S/Sx fluid overload - Given recent initiation of carvedilol and patient-reported low BP at cardiac rehab, will hold off on making any medication adjustments today.  - Continue carvedilol 3.125mg  BID, spironolactone 25mg  daily, digoxin 0.0625mg  daily, and losartan 12.5mg  BID - Obtain BMET today - consider increasing losartan at next visit pending labs and BP. - Will need repeat ECHO to reassess EF once optimized and assess need for ICD - Basic disease state pathophysiology, medication indication, mechanism and side effects reviewed at length with patient and he verbalized understanding  2. IDA - Iron deficiency s/p Feraheme x3, last TSat 13% - repeat labs today  3. CAD - s/p CABG with LIMA to LAD and SVG to OM on 04/08/2017 -No s/s ischemia -Continue ASA/statin  4. Tobacco abuse:  - Smoked 1 ppd PTA for >50 years but quit 03/2017 - Pt endorses continued cessation, pt praised and encouraged to continue  5. Hx of bladder CA:  - s/p Multiple rounds of BCG therapy - stable  6. Post op Atrial fibrillation -Maintaining NSR and no evidence of recurrence per pt - Off amiodarone  7. T2DM - Recent Hgb A1c 6.3% on 03/31/2017 - Actos recently discontinued, remains on metformin  Plan: 1)  Medication changes: Based on clinical presentation, vital signs and recent labs will not make any adjustments today. Continue HF medications noted above. 2) Labs: BMET today 3) Follow-up: Return to pharmacy clinic in 2 weeks to reassess BP.   Arrie Senate, PharmD, BCPS PGY-2 Cardiology Pharmacy Resident Pager: [phone] 08/05/2017   Ruta Hinds. Velva Harman, PharmD, BCPS, CPP Clinical Pharmacist Pager: [phone] Phone: [phone]

## 2017-08-06 ENCOUNTER — Other Ambulatory Visit (HOSPITAL_COMMUNITY): Payer: Self-pay

## 2017-08-06 ENCOUNTER — Other Ambulatory Visit (HOSPITAL_COMMUNITY): Payer: Medicare Other

## 2017-08-06 DIAGNOSIS — D509 Iron deficiency anemia, unspecified: Secondary | ICD-10-CM

## 2017-08-07 ENCOUNTER — Encounter (HOSPITAL_COMMUNITY)
Admission: RE | Admit: 2017-08-07 | Discharge: 2017-08-07 | Disposition: A | Discharge status: Home / Self Care | Payer: Medicare Other | Source: Ambulatory Visit | Attending: Internal Medicine | Admitting: Internal Medicine

## 2017-08-07 ENCOUNTER — Ambulatory Visit (HOSPITAL_COMMUNITY)
Admission: RE | Admit: 2017-08-07 | Discharge: 2017-08-07 | Disposition: A | Discharge status: Home / Self Care | Payer: Medicare Other | Source: Ambulatory Visit | Attending: Family Medicine | Admitting: Family Medicine

## 2017-08-07 DIAGNOSIS — I4891 Unspecified atrial fibrillation: Secondary | ICD-10-CM | POA: Diagnosis not present

## 2017-08-07 DIAGNOSIS — I081 Rheumatic disorders of both mitral and tricuspid valves: Secondary | ICD-10-CM | POA: Diagnosis not present

## 2017-08-07 DIAGNOSIS — I361 Nonrheumatic tricuspid (valve) insufficiency: Secondary | ICD-10-CM

## 2017-08-07 DIAGNOSIS — I5042 Chronic combined systolic (congestive) and diastolic (congestive) heart failure: Secondary | ICD-10-CM | POA: Diagnosis present

## 2017-08-07 DIAGNOSIS — I2510 Atherosclerotic heart disease of native coronary artery without angina pectoris: Secondary | ICD-10-CM | POA: Insufficient documentation

## 2017-08-07 DIAGNOSIS — J449 Chronic obstructive pulmonary disease, unspecified: Secondary | ICD-10-CM | POA: Diagnosis not present

## 2017-08-07 DIAGNOSIS — Z720 Tobacco use: Secondary | ICD-10-CM | POA: Diagnosis not present

## 2017-08-07 DIAGNOSIS — D649 Anemia, unspecified: Secondary | ICD-10-CM | POA: Diagnosis not present

## 2017-08-07 DIAGNOSIS — E119 Type 2 diabetes mellitus without complications: Secondary | ICD-10-CM | POA: Diagnosis not present

## 2017-08-07 DIAGNOSIS — I213 ST elevation (STEMI) myocardial infarction of unspecified site: Secondary | ICD-10-CM

## 2017-08-07 DIAGNOSIS — Z951 Presence of aortocoronary bypass graft: Secondary | ICD-10-CM

## 2017-08-07 LAB — GLUCOSE, CAPILLARY: Glucose-Capillary: 107 mg/dL — ABNORMAL HIGH (ref 65–99)

## 2017-08-07 NOTE — Progress Notes (Signed)
  Echocardiogram 2D Echocardiogram has been performed.  Merrie Roof F 08/07/2017, 4:11 PM

## 2017-08-08 ENCOUNTER — Encounter (HOSPITAL_COMMUNITY): Payer: Medicare Other | Admitting: Internal Medicine

## 2017-08-09 ENCOUNTER — Encounter (HOSPITAL_COMMUNITY)
Admission: RE | Admit: 2017-08-09 | Discharge: 2017-08-09 | Disposition: A | Discharge status: Home / Self Care | Payer: Medicare Other | Source: Ambulatory Visit | Attending: Internal Medicine | Admitting: Internal Medicine

## 2017-08-09 DIAGNOSIS — I213 ST elevation (STEMI) myocardial infarction of unspecified site: Secondary | ICD-10-CM

## 2017-08-09 DIAGNOSIS — D649 Anemia, unspecified: Secondary | ICD-10-CM | POA: Diagnosis not present

## 2017-08-09 LAB — GLUCOSE, CAPILLARY: GLUCOSE-CAPILLARY: 107 mg/dL — AB (ref 65–99)

## 2017-08-12 ENCOUNTER — Encounter (HOSPITAL_COMMUNITY)
Admission: RE | Admit: 2017-08-12 | Discharge: 2017-08-12 | Disposition: A | Discharge status: Home / Self Care | Payer: Medicare Other | Source: Ambulatory Visit | Attending: Internal Medicine | Admitting: Internal Medicine

## 2017-08-12 DIAGNOSIS — D649 Anemia, unspecified: Secondary | ICD-10-CM | POA: Diagnosis not present

## 2017-08-12 DIAGNOSIS — I213 ST elevation (STEMI) myocardial infarction of unspecified site: Secondary | ICD-10-CM

## 2017-08-12 DIAGNOSIS — Z951 Presence of aortocoronary bypass graft: Secondary | ICD-10-CM

## 2017-08-12 LAB — GLUCOSE, CAPILLARY: GLUCOSE-CAPILLARY: 126 mg/dL — AB (ref 65–99)

## 2017-08-13 ENCOUNTER — Telehealth (HOSPITAL_COMMUNITY): Payer: Self-pay | Admitting: *Deleted

## 2017-08-13 MED ORDER — APIXABAN 5 MG PO TABS: 5.0000 mg | ORAL_TABLET | Freq: Two times a day (BID) | ORAL | 6 refills | Status: DC

## 2017-08-13 MED ORDER — ASPIRIN EC 81 MG PO TBEC: 81.0000 mg | DELAYED_RELEASE_TABLET | Freq: Every day | ORAL | Status: DC

## 2017-08-13 MED ORDER — ASPIRIN EC 81 MG PO TBEC: 325.0000 mg | DELAYED_RELEASE_TABLET | Freq: Every day | ORAL | Status: DC

## 2017-08-13 NOTE — Telephone Encounter (Signed)
Per Dr Haroldine Laws, echo showed EF improved to 35-40%, pt has LV thrombus.  He wants [place] to start pt on Eliquis 5 mg BID for PAF and decrease ASA to 81 mg daily.  Spoke w/pt he is aware and agreeable, rx sent in

## 2017-08-14 ENCOUNTER — Encounter (HOSPITAL_COMMUNITY): Payer: Self-pay

## 2017-08-14 ENCOUNTER — Encounter (HOSPITAL_COMMUNITY)
Admission: RE | Admit: 2017-08-14 | Discharge: 2017-08-14 | Disposition: A | Discharge status: Home / Self Care | Payer: Medicare Other | Source: Ambulatory Visit | Attending: Internal Medicine | Admitting: Internal Medicine

## 2017-08-14 DIAGNOSIS — Z951 Presence of aortocoronary bypass graft: Secondary | ICD-10-CM

## 2017-08-14 DIAGNOSIS — D649 Anemia, unspecified: Secondary | ICD-10-CM | POA: Diagnosis not present

## 2017-08-14 DIAGNOSIS — I213 ST elevation (STEMI) myocardial infarction of unspecified site: Secondary | ICD-10-CM

## 2017-08-14 LAB — GLUCOSE, CAPILLARY: Glucose-Capillary: 130 mg/dL — ABNORMAL HIGH (ref 65–99)

## 2017-08-14 NOTE — Telephone Encounter (Signed)
Patient called back this morning to ask if it was fine for him to continue with cardiac rehab.  Patient is cleared to continue, no further questions.

## 2017-08-15 NOTE — Progress Notes (Signed)
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  apixaban (ELIQUIS) 5 MG TABS tablet, Take 1 tablet (5 mg total) by mouth 2 (two) times daily., Disp: 60 tablet, Rfl: 6 .  aspirin EC 81 MG tablet, Take 1 tablet (81 mg total) by mouth daily., Disp: , Rfl:  .  atorvastatin (LIPITOR) 80 MG tablet, Take 1 tablet (80 mg total) by mouth at bedtime., Disp: 30 tablet, Rfl: 1 .  carvedilol (COREG) 3.125 MG tablet, Take 1 tablet (3.125 mg total) by mouth 2 (two) times daily., Disp: 60 tablet, Rfl: 3 .  digoxin (LANOXIN) 0.125 MG tablet, Take 0.5 tablets (0.0625 mg total) by mouth daily., Disp: 45 tablet, Rfl: 3 .  losartan (COZAAR) 25 MG tablet, Take 0.5 tablets (12.5 mg total) by mouth 2 (two) times daily., Disp: 30 tablet, Rfl: 5 .  metFORMIN (GLUCOPHAGE) 500 MG tablet, Take 500 mg by mouth daily with breakfast., Disp: , Rfl:  .  mometasone (NASONEX) 50 MCG/ACT nasal spray, Place 1-2 sprays into the nose daily as needed for allergies., Disp: , Rfl:  .  spironolactone (ALDACTONE) 25 MG tablet, Take 1 tablet (25 mg total) by mouth daily., Disp: 30 tablet, Rfl: 3  Past Medical History: Past Medical History:  Diagnosis Date  . Bladder cancer ([place])   . Borderline glaucoma   . CHF (congestive heart failure) ([place])   . Coronary artery disease   . Diabetes mellitus  type 2, controlled ([place]) ORAL MED  . Essential hypertension     Tobacco Use: Social History   Tobacco Use  Smoking Status Former Smoker  . Packs/day: 1.00  . Years: 40.00  . Pack years: 40.00  . Types: Cigarettes  . Last attempt to quit: 03/31/2017  . Years since quitting: 0.3  Smokeless Tobacco Never Used    Labs: Recent Review Flowsheet Data    Labs for ITP Cardiac and Pulmonary Rehab Latest Ref Rng & Units 04/13/2017 04/14/2017 04/15/2017 04/16/2017 04/16/2017   Cholestrol 0 - 200 mg/dL - - - - -   LDLCALC 0 - 99 mg/dL - - - - -   HDL >40 mg/dL - - - - -   Trlycerides <150 mg/dL - - - - -   Hemoglobin A1c 4.8 - 5.6 % - - - - -   PHART 7.350 - 7.450 - - - - -   PCO2ART 32.0 - 48.0 mmHg - - - - -   HCO3 20.0 - 28.0 mmol/L - - - - -   TCO2 22 - 32 mmol/L - - - - -   ACIDBASEDEF 0.0 - 2.0 mmol/L - - - - -   O2SAT % 53.5 91.5 62.0 47.7 84.2      Capillary Blood Glucose: Lab Results  Component Value Date   GLUCAP 130 (H) 08/14/2017   GLUCAP 126 (H) 08/12/2017   GLUCAP 107 (H) 08/09/2017  GLUCAP 107 (H) 08/07/2017   GLUCAP 131 (H) 08/05/2017     Exercise Target Goals:    Exercise Program Goal: Individual exercise prescription set using results from initial 6 min walk test and THRR while considering  patient's activity barriers and safety.   Exercise Prescription Goal: Initial exercise prescription builds to 30-45 minutes a day of aerobic activity, 2-3 days per week.  Home exercise guidelines will be given to patient during program as part of exercise prescription that the participant will acknowledge.  Activity Barriers & Risk Stratification: Activity Barriers & Cardiac Risk Stratification - 07/11/17 0925    Activity Barriers & Cardiac Risk Stratification          Activity Barriers  Joint Problems;Deconditioning;Muscular Weakness;Other (comment)    Comments  R hip pain, numbness in feet    Cardiac Risk Stratification  High           6 Minute Walk: 6  Minute Walk    6 Minute Walk    Row Name 07/11/17 0924 07/11/17 1030 07/11/17 1042   Phase  Initial  no documentation  no documentation   Distance  1349 feet  no documentation  no documentation   Walk Time  6 minutes  no documentation  no documentation   # of Rest Breaks  0  no documentation  no documentation   MPH  no documentation  no documentation  2.6   METS  no documentation  no documentation  3.4   RPE  11  no documentation  no documentation   VO2 Peak  no documentation  no documentation  12.03   Symptoms  Yes (comment)  no documentation  no documentation   Comments  R  hip/shin pain 8/10; numbness in feet, B  no documentation  no documentation   Resting HR  75 bpm  no documentation  no documentation   Resting BP  118/70  no documentation  no documentation   Resting Oxygen Saturation   98 %  no documentation  no documentation   Exercise Oxygen Saturation  during 6 min walk  100 %  no documentation  no documentation   Max Ex. HR  no documentation  118 bpm  no documentation   Max Ex. BP  128/60  no documentation  no documentation       6 Minute Walk    Row Name 07/11/17 1057   2 Minute Post BP  112/66          Oxygen Initial Assessment:   Oxygen Re-Evaluation:   Oxygen Discharge (Final Oxygen Re-Evaluation):   Initial Exercise Prescription: Initial Exercise Prescription - 07/11/17 1000    Date of Initial Exercise RX and Referring Provider          Date  07/11/17    Referring Provider  Bensimhon, Daniel MD        Recumbant Bike          Level  2    Minutes  10    METs  2        NuStep          Level  2    SPM  80    Minutes  15    METs  2        Arm Ergometer          Level  --    Minutes  --    METs  --        Track  Laps  4    Minutes  5    METs  --        Prescription Details          Frequency (times per week)  3    Duration  Progress to 30 minutes of continuous aerobic without signs/symptoms of physical distress         Intensity          THRR 40-80% of Max Heartrate  60-119    Ratings of Perceived Exertion  11-13    Perceived Dyspnea  0-4        Progression          Progression  Continue to progress workloads to maintain intensity without signs/symptoms of physical distress.        Resistance Training          Training Prescription  Yes    Weight  2lbs    Reps  10-15           Perform Capillary Blood Glucose checks as needed.  Exercise Prescription Changes: Exercise Prescription Changes    Response to Exercise    Row Name 07/15/17 1416 07/29/17 1211 08/15/17 1100   Blood Pressure (Admit)  126/70  123/70  105/58   Blood Pressure (Exercise)  120/70  136/74  138/68   Blood Pressure (Exit)  110/62  120/70  94/62   Heart Rate (Admit)  92 bpm  91 bpm  76 bpm   Heart Rate (Exercise)  94 bpm  110 bpm  95 bpm   Heart Rate (Exit)  15 bpm  80 bpm  73 bpm   Rating of Perceived Exertion (Exercise)  11  11  11    Symptoms  none  none  noen   Comments  pt was oriented to exercise equipment today  pt was oriented to exercise equipment today  no documentation   Duration  Continue with 30 min of aerobic exercise without signs/symptoms of physical distress.  Continue with 30 min of aerobic exercise without signs/symptoms of physical distress.  Continue with 30 min of aerobic exercise without signs/symptoms of physical distress.   Intensity  THRR unchanged  THRR unchanged  THRR unchanged       Progression    Row Name 07/15/17 1416 07/29/17 1211 08/15/17 1100   Progression  Continue to progress workloads to maintain intensity without signs/symptoms of physical distress.  Continue to progress workloads to maintain intensity without signs/symptoms of physical distress.  Continue to progress workloads to maintain intensity without signs/symptoms of physical distress.   Average METs  2.2  3.8  3.5       Resistance Training    Row Name 07/15/17 1416 07/29/17 1211 08/15/17 1100   Training Prescription  Yes   Yes  Yes   Weight  2lbs  3lbs  3lbs   Reps  10-15  10-15  10-15   Time  no documentation  [place] Name 07/15/17 1416 07/29/17 1211 08/15/17 1100   Level  2  3.5  4   Minutes  15  15  15    METs  2.74  5.6  4.1       NuStep    Row Name 07/15/17 1416 07/29/17 1211 08/15/17 1100   Level  no documentation  no documentation  5   SPM  no documentation  no documentation  80   Minutes  no documentation  no  documentation  15   METs  no documentation  no documentation  3.8       Arm Ergometer    Row Name 07/15/17 1416 07/29/17 1211 08/15/17 1100   Level  2  no documentation  no documentation   Minutes  5  no documentation  no documentation   METs  2.74  no documentation  no documentation       Track    Row Name 07/15/17 1416 07/29/17 1211 08/15/17 1100   Laps  5  6  no documentation   Minutes  5  10  no documentation   METs  no documentation  2.03  no documentation       [place] Name 07/15/17 1416 07/29/17 1211 08/15/17 1100   Plans to continue exercise at  no documentation  Home (comment)  Home (comment)   Frequency  no documentation  Add 2 additional days to program exercise sessions.  Add 2 additional days to program exercise sessions.   Initial Home Exercises Provided  no documentation  07/29/17  07/29/17          Exercise Comments: Exercise Comments    Row Name 07/15/17 1705 08/15/17 1115 08/15/17 1453   Exercise Comments  Pt was oriented to exercise equipment today. Pt responded well to first exercise session; will continue to monitor activity levels and signs/symptoms of physical distress.  no documentation  Pt is off to a great start with exercise. Pt is responding well to exercise prescription. Will continue monitor pt and progress as tolerated.       Exercise Goals and Review: Exercise Goals    Exercise Goals    Row Name 07/11/17 0926   Increase Physical Activity  Yes   Intervention  Provide advice,  education, support and counseling about physical activity/exercise needs.;Develop an individualized exercise prescription for aerobic and resistive training based on initial evaluation findings, risk stratification, comorbidities and participant's personal goals.   Expected Outcomes  Short Term: Attend rehab on a regular basis to increase amount of physical activity.;Long Term: Exercising regularly at least 3-5 days a week.;Long Term: Add in home exercise to make exercise part of routine and to increase amount of physical activity.   Increase Strength and Stamina  Yes return to fishing   Intervention  Provide advice, education, support and counseling about physical activity/exercise needs.;Develop an individualized exercise prescription for aerobic and resistive training based on initial evaluation findings, risk stratification, comorbidities and participant's personal goals.   Expected Outcomes  Short Term: Increase workloads from initial exercise prescription for resistance, speed, and METs.;Short Term: Perform resistance training exercises routinely during rehab and add in resistance training at home;Long Term: Improve cardiorespiratory fitness, muscular endurance and strength as measured by increased METs and functional capacity (6MWT)   Able to understand and use rate of perceived exertion (RPE) scale  Yes   Intervention  Provide education and explanation on how to use RPE scale   Expected Outcomes  Short Term: Able to use RPE daily in rehab to express subjective intensity level;Long Term:  Able to use RPE to guide intensity level when exercising independently   Knowledge and understanding of Target Heart Rate Range (THRR)  Yes   Intervention  Provide education and explanation of THRR including how the numbers were predicted and where they are located for reference   Expected Outcomes  Short Term: Able to state/look up THRR;Long Term: Able to use THRR to govern intensity when exercising  independently;Short  Term: Able to use daily as guideline for intensity in rehab   Able to check pulse independently  Yes   Intervention  Provide education and demonstration on how to check pulse in carotid and radial arteries.;Review the importance of being able to check your own pulse for safety during independent exercise   Expected Outcomes  Short Term: Able to explain why pulse checking is important during independent exercise;Long Term: Able to check pulse independently and accurately   Understanding of Exercise Prescription  Yes   Intervention  Provide education, explanation, and written materials on patient's individual exercise prescription   Expected Outcomes  Short Term: Able to explain program exercise prescription;Long Term: Able to explain home exercise prescription to exercise independently          Exercise Goals Re-Evaluation : Exercise Goals Re-Evaluation    Exercise Goal Re-Evaluation    Row Name 07/15/17 1707 07/29/17 1654 08/15/17 1115   Exercise Goals Review  Increase Physical Activity;Able to understand and use rate of perceived exertion (RPE) scale  Increase Physical Activity;Able to understand and use rate of perceived exertion (RPE) scale;Knowledge and understanding of Target Heart Rate Range (THRR);Understanding of Exercise Prescription;Increase Strength and Stamina;Able to check pulse independently  Increase Physical Activity;Able to understand and use rate of perceived exertion (RPE) scale;Knowledge and understanding of Target Heart Rate Range (THRR);Understanding of Exercise Prescription;Increase Strength and Stamina;Able to check pulse independently   Comments  Pt has a great understanding of RPE scale and was able to exercise for 30 minutes without difficulty.  Reviewed home exercise with pt today.  Pt plans to walk for exercise for 10' 2-3x/week. Discussed exercise/activity progression. Reviewed THR, pulse, RPE, sign and symptoms,  and when to call 911 or MD.  Also  discussed weather considerations and indoor options.  Pt voiced understanding.  pt making progress towards HEP with consistent participation in CR.     Expected Outcomes  Pt will be improve in cardiorespiratory fitness and functional mobility.   Pt will be improve in cardiorespiratory fitness and functional mobility.   Pt will be improve in cardiorespiratory fitness and functional mobility.            Discharge Exercise Prescription (Final Exercise Prescription Changes): Exercise Prescription Changes - 08/15/17 1100    Response to Exercise          Blood Pressure (Admit)  105/58    Blood Pressure (Exercise)  138/68    Blood Pressure (Exit)  94/62    Heart Rate (Admit)  76 bpm    Heart Rate (Exercise)  95 bpm    Heart Rate (Exit)  73 bpm    Rating of Perceived Exertion (Exercise)  11    Symptoms  noen    Duration  Continue with 30 min of aerobic exercise without signs/symptoms of physical distress.    Intensity  THRR unchanged        Progression          Progression  Continue to progress workloads to maintain intensity without signs/symptoms of physical distress.    Average METs  3.5        Resistance Training          Training Prescription  Yes    Weight  3lbs    Reps  10-15    Time  10 Minutes        Recumbant Bike          Level  4    Minutes  15    METs  4.1        NuStep          Level  5    SPM  80    Minutes  15    METs  3.8        Home Exercise Plan          Plans to continue exercise at  Home (comment)    Frequency  Add 2 additional days to program exercise sessions.    Initial Home Exercises Provided  07/29/17           Nutrition:  Target Goals: Understanding of nutrition guidelines, daily intake of sodium 1500mg , cholesterol 200mg , calories 30% from fat and 7% or less from saturated fats, daily to have 5 or more servings of fruits and vegetables.  Biometrics: Pre Biometrics - 07/11/17 1142    Pre Biometrics          Height  6\' 1"   (1.854 m)    Weight  179 lb 3.7 oz (81.3 kg)    Waist Circumference  37.75 inches    Hip Circumference  41 inches    Waist to Hip Ratio  0.92 %    BMI (Calculated)  23.65    Triceps Skinfold  13 mm    % Body Fat  24.2 %    Grip Strength  42 kg    Flexibility  9 in    Single Leg Stand  10.41 seconds            Nutrition Therapy Plan and Nutrition Goals: Nutrition Therapy & Goals - 07/11/17 1126    Nutrition Therapy          Diet  Carb Modified, Heart Healthy        Personal Nutrition Goals          Nutrition Goal  Pt to identify and limit food sources of saturated fat, trans fat, and sodium    Personal Goal #2  Pt to describe the benefit of including fruits, vegetables, whole grains, and low-fat dairy products in a heart healthy meal plan.        Intervention Plan          Intervention  Prescribe, educate and counsel regarding individualized specific dietary modifications aiming towards targeted core components such as weight, hypertension, lipid management, diabetes, heart failure and other comorbidities.    Expected Outcomes  Short Term Goal: Understand basic principles of dietary content, such as calories, fat, sodium, cholesterol and nutrients.;Long Term Goal: Adherence to prescribed nutrition plan.           Nutrition Assessments: Nutrition Assessments - 07/11/17 1126    MEDFICTS Scores          Pre Score  62           Nutrition Goals Re-Evaluation:   Nutrition Goals Re-Evaluation:   Nutrition Goals Discharge (Final Nutrition Goals Re-Evaluation):   Psychosocial: Target Goals: Acknowledge presence or absence of significant depression and/or stress, maximize coping skills, provide positive support system. Participant is able to verbalize types and ability to use techniques and skills needed for reducing stress and depression.  Initial Review & Psychosocial Screening: Initial Psych Review & Screening - 07/11/17 1137    Initial Review          Current  issues with  None Identified        Family Dynamics          Good Support System?  Yes Patient has his wife for support  Barriers          Psychosocial barriers to participate in program  There are no identifiable barriers or psychosocial needs.        Screening Interventions          Interventions  Encouraged to exercise           Quality of Life Scores: Quality of Life - 07/11/17 1037    Quality of Life Scores          Health/Function Pre  21.4 %    Socioeconomic Pre  27.08 %    Psych/Spiritual Pre  23.33 %    Family Pre  27.3 %    GLOBAL Pre  23.75 %          Scores of 19 and below usually indicate a poorer quality of life in these areas.  A difference of  2-3 points is a clinically meaningful difference.  A difference of 2-3 points in the total score of the Quality of Life Index has been associated with significant improvement in overall quality of life, self-image, physical symptoms, and general health in studies assessing change in quality of life.  PHQ-9: Recent Review Flowsheet Data    Depression screen [facility] 2/9 07/15/2017   Decreased Interest 0   Down, Depressed, Hopeless 0   PHQ - 2 Score 0     Interpretation of Total Score  Total Score Depression Severity:  1-4 = Minimal depression, 5-9 = Mild depression, 10-14 = Moderate depression, 15-19 = Moderately severe depression, 20-27 = Severe depression   Psychosocial Evaluation and Intervention: Psychosocial Evaluation - 07/15/17 1622    Psychosocial Evaluation & Interventions          Interventions  Encouraged to exercise with the program and follow exercise prescription;Stress management education;Relaxation education    Comments  No psychosocial needs identified. No followup needed at this time. Pt enjoys fishing.     Expected Outcomes  Pt will exhibit a positive outlook and good coping skills.     Continue Psychosocial Services   No Follow up required           Psychosocial  Re-Evaluation: Psychosocial Re-Evaluation    Psychosocial Re-Evaluation    [place] Name 07/18/17 1129 08/14/17 1704   Current issues with  None Identified  None Identified   Comments  no psychosocial needs identified, no interventions necessary.   no psychosocial needs identified, no interventions necessary.    Expected Outcomes  pt will exhibit positive outlook with good coping skills.   pt will exhibit positive outlook with good coping skills.    Interventions  Encouraged to attend Cardiac Rehabilitation for the exercise  Encouraged to attend Cardiac Rehabilitation for the exercise   Continue Psychosocial Services   No Follow up required  No Follow up required          Psychosocial Discharge (Final Psychosocial Re-Evaluation): Psychosocial Re-Evaluation - 08/14/17 1704    Psychosocial Re-Evaluation          Current issues with  None Identified    Comments  no psychosocial needs identified, no interventions necessary.     Expected Outcomes  pt will exhibit positive outlook with good coping skills.     Interventions  Encouraged to attend Cardiac Rehabilitation for the exercise    Continue Psychosocial Services   No Follow up required           Vocational Rehabilitation: Provide vocational rehab assistance to qualifying candidates.   Vocational Rehab Evaluation & Intervention: Vocational Rehab -  07/11/17 1137    Initial Vocational Rehab Evaluation & Intervention          Assessment shows need for Vocational Rehabilitation  No           Education: Education Goals: Education classes will be provided on a weekly basis, covering required topics. Participant will state understanding/return demonstration of topics presented.  Learning Barriers/Preferences: Learning Barriers/Preferences - 07/11/17 7035    Learning Barriers/Preferences          Learning Barriers  Sight    Learning Preferences  Written Material;Pictoral;Video           Education Topics: Count Your Pulse:   -Group instruction provided by verbal instruction, demonstration, patient participation and written materials to support subject.  Instructors address importance of being able to find your pulse and how to count your pulse when at home without a heart monitor.  Patients get hands on experience counting their pulse with staff help and individually.   Heart Attack, Angina, and Risk Factor Modification:  -Group instruction provided by verbal instruction, video, and written materials to support subject.  Instructors address signs and symptoms of angina and heart attacks.    Also discuss risk factors for heart disease and how to make changes to improve heart health risk factors.   Functional Fitness:  -Group instruction provided by verbal instruction, demonstration, patient participation, and written materials to support subject.  Instructors address safety measures for doing things around the house.  Discuss how to get up and down off the floor, how to pick things up properly, how to safely get out of a chair without assistance, and balance training.   Meditation and Mindfulness:  -Group instruction provided by verbal instruction, patient participation, and written materials to support subject.  Instructor addresses importance of mindfulness and meditation practice to help reduce stress and improve awareness.  Instructor also leads participants through a meditation exercise.    Stretching for Flexibility and Mobility:  -Group instruction provided by verbal instruction, patient participation, and written materials to support subject.  Instructors lead participants through series of stretches that are designed to increase flexibility thus improving mobility.  These stretches are additional exercise for major muscle groups that are typically performed during regular warm up and cool down.   Hands Only CPR:  -Group verbal, video, and participation provides a basic overview of AHA guidelines for  community CPR. Role-play of emergencies allow participants the opportunity to practice calling for help and chest compression technique with discussion of AED use.   Hypertension: -Group verbal and written instruction that provides a basic overview of hypertension including the most recent diagnostic guidelines, risk factor reduction with self-care instructions and medication management.    Nutrition I class: Heart Healthy Eating:  -Group instruction provided by PowerPoint slides, verbal discussion, and written materials to support subject matter. The instructor gives an explanation and review of the Therapeutic Lifestyle Changes diet recommendations, which includes a discussion on lipid goals, dietary fat, sodium, fiber, plant stanol/sterol esters, sugar, and the components of a well-balanced, healthy diet.   Nutrition II class: Lifestyle Skills:  -Group instruction provided by PowerPoint slides, verbal discussion, and written materials to support subject matter. The instructor gives an explanation and review of label reading, grocery shopping for heart health, heart healthy recipe modifications, and ways to make healthier choices when eating out.   Diabetes Question & Answer:  -Group instruction provided by PowerPoint slides, verbal discussion, and written materials to support subject matter. The instructor gives an explanation  and review of diabetes co-morbidities, pre- and post-prandial blood glucose goals, pre-exercise blood glucose goals, signs, symptoms, and treatment of hypoglycemia and hyperglycemia, and foot care basics.   Diabetes Blitz:  -Group instruction provided by PowerPoint slides, verbal discussion, and written materials to support subject matter. The instructor gives an explanation and review of the physiology behind type 1 and type 2 diabetes, diabetes medications and rational behind using different medications, pre- and post-prandial blood glucose recommendations and  Hemoglobin A1c goals, diabetes diet, and exercise including blood glucose guidelines for exercising safely.    Portion Distortion:  -Group instruction provided by PowerPoint slides, verbal discussion, written materials, and food models to support subject matter. The instructor gives an explanation of serving size versus portion size, changes in portions sizes over the last 20 years, and what consists of a serving from each food group.   Stress Management:  -Group instruction provided by verbal instruction, video, and written materials to support subject matter.  Instructors review role of stress in heart disease and how to cope with stress positively.     Exercising on Your Own:  -Group instruction provided by verbal instruction, power point, and written materials to support subject.  Instructors discuss benefits of exercise, components of exercise, frequency and intensity of exercise, and end points for exercise.  Also discuss use of nitroglycerin and activating EMS.  Review options of places to exercise outside of rehab.  Review guidelines for sex with heart disease.   Cardiac Drugs I:  -Group instruction provided by verbal instruction and written materials to support subject.  Instructor reviews cardiac drug classes: antiplatelets, anticoagulants, beta blockers, and statins.  Instructor discusses reasons, side effects, and lifestyle considerations for each drug class.   Cardiac Drugs II:  -Group instruction provided by verbal instruction and written materials to support subject.  Instructor reviews cardiac drug classes: angiotensin converting enzyme inhibitors (ACE-I), angiotensin II receptor blockers (ARBs), nitrates, and calcium channel blockers.  Instructor discusses reasons, side effects, and lifestyle considerations for each drug class.   Anatomy and Physiology of the Circulatory System:  Group verbal and written instruction and models provide basic cardiac anatomy and physiology,  with the coronary electrical and arterial systems. Review of: AMI, Angina, Valve disease, Heart Failure, Peripheral Artery Disease, Cardiac Arrhythmia, Pacemakers, and the ICD.   Other Education:  -Group or individual verbal, written, or video instructions that support the educational goals of the cardiac rehab program.   Holiday Eating Survival Tips:  -Group instruction provided by PowerPoint slides, verbal discussion, and written materials to support subject matter. The instructor gives patients tips, tricks, and techniques to help them not only survive but enjoy the holidays despite the onslaught of food that accompanies the holidays.   Knowledge Questionnaire Score: Knowledge Questionnaire Score - 07/11/17 1028    Knowledge Questionnaire Score          Pre Score  18/24           Core Components/Risk Factors/Patient Goals at Admission: Personal Goals and Risk Factors at Admission - 07/11/17 1043    Core Components/Risk Factors/Patient Goals on Admission          Improve shortness of breath with ADL's  Yes    Intervention  Provide education, individualized exercise plan and daily activity instruction to help decrease symptoms of SOB with activities of daily living.    Expected Outcomes  Long Term: Be able to perform more ADLs without symptoms or delay the onset of symptoms;Short Term: Improve cardiorespiratory fitness to  achieve a reduction of symptoms when performing ADLs    Diabetes  Yes    Intervention  Provide education about signs/symptoms and action to take for hypo/hyperglycemia.;Provide education about proper nutrition, including hydration, and aerobic/resistive exercise prescription along with prescribed medications to achieve blood glucose in normal ranges: Fasting glucose 65-99 mg/dL    Expected Outcomes  Short Term: Participant verbalizes understanding of the signs/symptoms and immediate care of hyper/hypoglycemia, proper foot care and importance of medication,  aerobic/resistive exercise and nutrition plan for blood glucose control.;Long Term: Attainment of HbA1C < 7%.    Heart Failure  Yes    Intervention  Provide a combined exercise and nutrition program that is supplemented with education, support and counseling about heart failure. Directed toward relieving symptoms such as shortness of breath, decreased exercise tolerance, and extremity edema.    Expected Outcomes  Improve functional capacity of life;Short term: Attendance in program 2-3 days a week with increased exercise capacity. Reported lower sodium intake. Reported increased fruit and vegetable intake. Reports medication compliance.;Long term: Adoption of self-care skills and reduction of barriers for early signs and symptoms recognition and intervention leading to self-care maintenance.;Short term: Daily weights obtained and reported for increase. Utilizing diuretic protocols set by physician.    Hypertension  Yes    Intervention  Provide education on lifestyle modifcations including regular physical activity/exercise, weight management, moderate sodium restriction and increased consumption of fresh fruit, vegetables, and low fat dairy, alcohol moderation, and smoking cessation.;Monitor prescription use compliance.    Expected Outcomes  Short Term: Continued assessment and intervention until BP is < 140/64mm HG in hypertensive participants. < 130/80mm HG in hypertensive participants with diabetes, heart failure or chronic kidney disease.;Long Term: Maintenance of blood pressure at goal levels.    Lipids  Yes    Intervention  Provide education and support for participant on nutrition & aerobic/resistive exercise along with prescribed medications to achieve LDL 70mg , HDL >40mg .    Expected Outcomes  Short Term: Participant states understanding of desired cholesterol values and is compliant with medications prescribed. Participant is following exercise prescription and nutrition guidelines.;Long Term:  Cholesterol controlled with medications as prescribed, with individualized exercise RX and with personalized nutrition plan. Value goals: LDL < 70mg , HDL > 40 mg.           Core Components/Risk Factors/Patient Goals Review:  Goals and Risk Factor Review    Core Components/Risk Factors/Patient Goals Review    Row Name 07/15/17 1615 08/14/17 1703   Personal Goals Review  Heart Failure;Hypertension;Lipids;Diabetes  Heart Failure;Hypertension;Lipids;Diabetes   Review  Pt with multiple CAD RF. Pt is eager to participate in CR exercises.  Pt with multiple CAD RF. Pt is eager to participate in CR exercises. pt with low normal BP, asymptomatic, tolerates exercise well.     Expected Outcomes  Pt will participate in CR exercises and educational opportunities about nutrition, exercise, and lifestyle modifications.   Pt will participate in CR exercises and educational opportunities about nutrition, exercise, and lifestyle modifications.           Core Components/Risk Factors/Patient Goals at Discharge (Final Review):  Goals and Risk Factor Review - 08/14/17 1703    Core Components/Risk Factors/Patient Goals Review          Personal Goals Review  Heart Failure;Hypertension;Lipids;Diabetes    Review  Pt with multiple CAD RF. Pt is eager to participate in CR exercises. pt with low normal BP, asymptomatic, tolerates exercise well.      Expected Outcomes  Pt  will participate in CR exercises and educational opportunities about nutrition, exercise, and lifestyle modifications.            ITP Comments: ITP Comments    Row Name 07/11/17 7616 07/18/17 1127 08/14/17 1702   ITP Comments  Dr. Fransico Him, Medical Director  30 day ITP  review. pt demonstrates willingness to participate in CR group exercise program.   30 day ITP  review. pt demonstrates willingness to participate in CR group exercise program.       Comments: Andi Hence, RN, BSN Cardiac Pulmonary Rehab 08/15/17 3:16 PM

## 2017-08-16 ENCOUNTER — Encounter (HOSPITAL_COMMUNITY)
Admission: RE | Admit: 2017-08-16 | Discharge: 2017-08-16 | Disposition: A | Discharge status: Home / Self Care | Payer: Medicare Other | Source: Ambulatory Visit | Attending: Internal Medicine | Admitting: Internal Medicine

## 2017-08-16 DIAGNOSIS — D649 Anemia, unspecified: Secondary | ICD-10-CM | POA: Diagnosis not present

## 2017-08-16 DIAGNOSIS — I213 ST elevation (STEMI) myocardial infarction of unspecified site: Secondary | ICD-10-CM

## 2017-08-16 DIAGNOSIS — Z951 Presence of aortocoronary bypass graft: Secondary | ICD-10-CM

## 2017-08-19 ENCOUNTER — Ambulatory Visit (HOSPITAL_COMMUNITY)
Admission: RE | Admit: 2017-08-19 | Discharge: 2017-08-19 | Disposition: A | Discharge status: Home / Self Care | Payer: Medicare Other | Source: Ambulatory Visit | Attending: Internal Medicine | Admitting: Internal Medicine

## 2017-08-19 ENCOUNTER — Encounter (HOSPITAL_COMMUNITY)
Admission: RE | Admit: 2017-08-19 | Discharge: 2017-08-19 | Disposition: A | Discharge status: Home / Self Care | Payer: Medicare Other | Source: Ambulatory Visit | Attending: Internal Medicine | Admitting: Internal Medicine

## 2017-08-19 VITALS — BP 122/64 | HR 62 | Wt 184.2 lb

## 2017-08-19 DIAGNOSIS — I5022 Chronic systolic (congestive) heart failure: Secondary | ICD-10-CM | POA: Diagnosis present

## 2017-08-19 DIAGNOSIS — I2510 Atherosclerotic heart disease of native coronary artery without angina pectoris: Secondary | ICD-10-CM | POA: Diagnosis not present

## 2017-08-19 DIAGNOSIS — Z79899 Other long term (current) drug therapy: Secondary | ICD-10-CM | POA: Insufficient documentation

## 2017-08-19 DIAGNOSIS — Z951 Presence of aortocoronary bypass graft: Secondary | ICD-10-CM

## 2017-08-19 DIAGNOSIS — I5042 Chronic combined systolic (congestive) and diastolic (congestive) heart failure: Secondary | ICD-10-CM

## 2017-08-19 DIAGNOSIS — Z7901 Long term (current) use of anticoagulants: Secondary | ICD-10-CM | POA: Insufficient documentation

## 2017-08-19 DIAGNOSIS — Z8551 Personal history of malignant neoplasm of bladder: Secondary | ICD-10-CM | POA: Insufficient documentation

## 2017-08-19 DIAGNOSIS — E119 Type 2 diabetes mellitus without complications: Secondary | ICD-10-CM | POA: Diagnosis not present

## 2017-08-19 DIAGNOSIS — E509 Vitamin A deficiency, unspecified: Secondary | ICD-10-CM | POA: Diagnosis not present

## 2017-08-19 DIAGNOSIS — Z87891 Personal history of nicotine dependence: Secondary | ICD-10-CM | POA: Insufficient documentation

## 2017-08-19 DIAGNOSIS — Z7984 Long term (current) use of oral hypoglycemic drugs: Secondary | ICD-10-CM | POA: Insufficient documentation

## 2017-08-19 DIAGNOSIS — D649 Anemia, unspecified: Secondary | ICD-10-CM | POA: Diagnosis not present

## 2017-08-19 DIAGNOSIS — I110 Hypertensive heart disease with heart failure: Secondary | ICD-10-CM | POA: Diagnosis not present

## 2017-08-19 DIAGNOSIS — J449 Chronic obstructive pulmonary disease, unspecified: Secondary | ICD-10-CM | POA: Insufficient documentation

## 2017-08-19 DIAGNOSIS — I213 ST elevation (STEMI) myocardial infarction of unspecified site: Secondary | ICD-10-CM

## 2017-08-19 DIAGNOSIS — Z86718 Personal history of other venous thrombosis and embolism: Secondary | ICD-10-CM | POA: Diagnosis not present

## 2017-08-19 LAB — GLUCOSE, CAPILLARY
GLUCOSE-CAPILLARY: 113 mg/dL — AB (ref 65–99)
Glucose-Capillary: 125 mg/dL — ABNORMAL HIGH (ref 65–99)

## 2017-08-19 MED ORDER — LOSARTAN POTASSIUM 25 MG PO TABS: 25.0000 mg | ORAL_TABLET | Freq: Two times a day (BID) | ORAL | 5 refills | Status: DC

## 2017-08-19 NOTE — Patient Instructions (Signed)
It was great seeing you today!  Please INCREASE your losartan to 25 mg by mouth twice daily.  Please keep your appointment with pharmacist on 09/09/17 at 2:45 PM.

## 2017-08-19 NOTE — Progress Notes (Signed)
HF MD: Dr. Haroldine Laws  HPI:  [name] Wuebker Padgettis a 72 y.o.Caucasianmalewith h/o DM2, COPD (quit smoking 12/18), severe 3v CAD s/p CABG 12/87/8676, chronic systolic HF due to ICM (EF 25-30%onecho 12/18), HTN, DM2, h/o bladder CA s/p multiple rounds of BCG therapy, and paroxysmal AF.   Pt admitted in 12/18 with anterior STEMI and developed cardiogenic shock requiring Impella support. Urgent cath showed severe 3v CAD as below. While on impella, pt developed sepsis of unclear source with WBC ~ 40K, though did have pseudomonason his UCx. Treated for AECOPD and completed ABX therapy. BCx negative. Stabilized and underwent CABG x 2 with LIMA to LAD and SVG to OM on 04/08/2017. Pt transiently required milrinone but weaned prior to discharge. Required amiodarone for brief post op afib, but had no recurrence.  Presents today for pharmacist-led HF medication titration. No medication changes were made at last pharmacy visit on 08/05/2017 2/2 soft BP/HR. ECHO on 08/07/17 noted improved EF to 35-40%. On 08/13/17, pt was started on Eliquis 5mg  BID for LV thrombus and decreased ASA to 81mg  daily.   Shortness of breath/dyspnea on exertion? Yes, reports SOB in the morning for several weeks  Orthopnea/PND? No  Edema? No   Lightheadedness/dizziness? No  Daily weights at home? No, checks 3x a week at cardiac rehab (usually 180 lbs)  Blood pressure/heart rate monitoring at home? No, checks 3x a week at rehab, (usually >120/60 mmHg, 100/60 mmHg today)  Following low-sodium/fluid-restricted diet? No - reports no change since last pharmacy visit  HF Medications: Losartan 12.5mg  BID Carvedilol 3.125mg  BID Spironolactone 25mg  daily Digoxin 0.0625mg  daily  Has the patient been experiencing any side effects to the medications prescribed?  No.  Does the patient have any problems obtaining medications due to transportation or finances?   No. BCBS/Medicare.  Understanding of regimen: good Understanding  of indications: good Potential of compliance: good Patient understands to avoid NSAIDs. Patient understands to avoid decongestants.  Pertinent Lab Values:  08/05/17:Serum creatinine1.29 (BL 1.3-1.5),BUN 20, Potassium4.4, Sodium139  05/23/17: Digoxin 0.5 ng/mL  Vital Signs:  Weight: 184.2 lbs (dry weight: ~179 lbs)  Blood pressure: 122/64 mmHg   Heart rate: 61 bpm  Assessment: 1. Chronicsystolic CHF (HM09-47%), due toCAD. NYHA classIIsymptoms. - Volume status stable - Increase losartan to 25mg  BID -Continue carvedilol 3.125mg  BID, spironolactone 25mg  daily, and digoxin 0.0625mg  daily - If tolerates losartan increase, may consider switch to [employer]  - Basic disease state pathophysiology, medication indication, mechanism and side effects reviewed at length with patient and he verbalized understanding 2. IDA - Iron deficiency s/p Feraheme x3, last TSat 13% on 08/06/17 3. CAD - s/p CABG with LIMA to LAD and SVG to OM on 04/08/2017 -No s/s ischemia -Continue ASA/statin 4. Tobacco abuse: -Smoked 1 ppd PTA for >50 yearsbut quit12/2018 - Pt endorses continued cessation, pt praised and encouraged to continue 5. Hx of bladder CA:  - s/p Multiple rounds of BCG therapy- stable 6. Post op Atrial fibrillation -MaintainingNSR and no evidence of recurrenceper pt - Off amiodarone 7. T2DM - Recent Hgb A1c 6.3% on 03/31/2017 - Actos recently discontinued, remains on metformin 8. LV Thrombus - On Eliquis 5 mg BID - No bleeding issues noted  Plan: 1) Medication changes: Based on clinical presentation, vital signs and recent labs will increase losartan to 25mg  daily 2) Labs: BMET at next visit 3) Follow-up: pharmacy visit 09/09/17 and Dr. Haroldine Laws on 09/30/17  Ruta Hinds. Velva Harman, PharmD, BCPS, CPP Clinical Pharmacist Pager: [phone] Phone: [phone] 08/19/2017 3:00 PM

## 2017-08-21 ENCOUNTER — Encounter (HOSPITAL_COMMUNITY)
Admission: RE | Admit: 2017-08-21 | Discharge: 2017-08-21 | Disposition: A | Discharge status: Home / Self Care | Payer: Medicare Other | Source: Ambulatory Visit | Attending: Internal Medicine | Admitting: Internal Medicine

## 2017-08-21 DIAGNOSIS — I213 ST elevation (STEMI) myocardial infarction of unspecified site: Secondary | ICD-10-CM

## 2017-08-21 DIAGNOSIS — D649 Anemia, unspecified: Secondary | ICD-10-CM | POA: Diagnosis not present

## 2017-08-21 LAB — GLUCOSE, CAPILLARY: Glucose-Capillary: 117 mg/dL — ABNORMAL HIGH (ref 65–99)

## 2017-08-23 ENCOUNTER — Encounter (HOSPITAL_COMMUNITY)
Admission: RE | Admit: 2017-08-23 | Discharge: 2017-08-23 | Disposition: A | Discharge status: Home / Self Care | Payer: Medicare Other | Source: Ambulatory Visit | Attending: Internal Medicine | Admitting: Internal Medicine

## 2017-08-26 ENCOUNTER — Encounter (HOSPITAL_COMMUNITY)
Admission: RE | Admit: 2017-08-26 | Discharge: 2017-08-26 | Disposition: A | Discharge status: Home / Self Care | Payer: Medicare Other | Source: Ambulatory Visit | Attending: Internal Medicine | Admitting: Internal Medicine

## 2017-08-26 DIAGNOSIS — I213 ST elevation (STEMI) myocardial infarction of unspecified site: Secondary | ICD-10-CM

## 2017-08-26 DIAGNOSIS — D649 Anemia, unspecified: Secondary | ICD-10-CM | POA: Diagnosis not present

## 2017-08-26 LAB — GLUCOSE, CAPILLARY: Glucose-Capillary: 113 mg/dL — ABNORMAL HIGH (ref 65–99)

## 2017-08-28 ENCOUNTER — Encounter (HOSPITAL_COMMUNITY)
Admission: RE | Admit: 2017-08-28 | Discharge: 2017-08-28 | Disposition: A | Discharge status: Home / Self Care | Payer: Medicare Other | Source: Ambulatory Visit | Attending: Internal Medicine | Admitting: Internal Medicine

## 2017-08-28 DIAGNOSIS — I213 ST elevation (STEMI) myocardial infarction of unspecified site: Secondary | ICD-10-CM

## 2017-08-28 DIAGNOSIS — D649 Anemia, unspecified: Secondary | ICD-10-CM | POA: Diagnosis not present

## 2017-08-28 DIAGNOSIS — Z951 Presence of aortocoronary bypass graft: Secondary | ICD-10-CM | POA: Diagnosis not present

## 2017-08-28 DIAGNOSIS — I252 Old myocardial infarction: Secondary | ICD-10-CM | POA: Insufficient documentation

## 2017-08-28 LAB — GLUCOSE, CAPILLARY: Glucose-Capillary: 102 mg/dL — ABNORMAL HIGH (ref 65–99)

## 2017-08-30 ENCOUNTER — Encounter (HOSPITAL_COMMUNITY)
Admission: RE | Admit: 2017-08-30 | Discharge: 2017-08-30 | Disposition: A | Discharge status: Home / Self Care | Payer: Medicare Other | Source: Ambulatory Visit | Attending: Internal Medicine | Admitting: Internal Medicine

## 2017-08-30 DIAGNOSIS — I213 ST elevation (STEMI) myocardial infarction of unspecified site: Secondary | ICD-10-CM

## 2017-08-30 DIAGNOSIS — Z951 Presence of aortocoronary bypass graft: Secondary | ICD-10-CM

## 2017-08-30 DIAGNOSIS — D649 Anemia, unspecified: Secondary | ICD-10-CM | POA: Diagnosis not present

## 2017-09-02 ENCOUNTER — Ambulatory Visit (HOSPITAL_COMMUNITY): Payer: Medicare Other

## 2017-09-02 ENCOUNTER — Encounter (HOSPITAL_COMMUNITY): Payer: Medicare Other

## 2017-09-02 LAB — GLUCOSE, CAPILLARY: Glucose-Capillary: 131 mg/dL — ABNORMAL HIGH (ref 65–99)

## 2017-09-04 ENCOUNTER — Encounter (HOSPITAL_COMMUNITY): Payer: Medicare Other

## 2017-09-06 ENCOUNTER — Encounter (HOSPITAL_COMMUNITY): Payer: Medicare Other

## 2017-09-09 ENCOUNTER — Ambulatory Visit (HOSPITAL_COMMUNITY)
Admission: RE | Admit: 2017-09-09 | Discharge: 2017-09-09 | Disposition: A | Discharge status: Home / Self Care | Payer: Medicare Other | Source: Ambulatory Visit | Attending: Cardiology | Admitting: Cardiology

## 2017-09-09 ENCOUNTER — Encounter (HOSPITAL_COMMUNITY)
Admission: RE | Admit: 2017-09-09 | Discharge: 2017-09-09 | Disposition: A | Discharge status: Home / Self Care | Payer: Medicare Other | Source: Ambulatory Visit | Attending: Internal Medicine | Admitting: Internal Medicine

## 2017-09-09 VITALS — BP 122/62 | HR 61

## 2017-09-09 DIAGNOSIS — Z86718 Personal history of other venous thrombosis and embolism: Secondary | ICD-10-CM | POA: Insufficient documentation

## 2017-09-09 DIAGNOSIS — I110 Hypertensive heart disease with heart failure: Secondary | ICD-10-CM | POA: Insufficient documentation

## 2017-09-09 DIAGNOSIS — I2510 Atherosclerotic heart disease of native coronary artery without angina pectoris: Secondary | ICD-10-CM | POA: Diagnosis not present

## 2017-09-09 DIAGNOSIS — Z951 Presence of aortocoronary bypass graft: Secondary | ICD-10-CM

## 2017-09-09 DIAGNOSIS — Z8551 Personal history of malignant neoplasm of bladder: Secondary | ICD-10-CM | POA: Diagnosis not present

## 2017-09-09 DIAGNOSIS — Z87891 Personal history of nicotine dependence: Secondary | ICD-10-CM | POA: Diagnosis not present

## 2017-09-09 DIAGNOSIS — D509 Iron deficiency anemia, unspecified: Secondary | ICD-10-CM | POA: Diagnosis present

## 2017-09-09 DIAGNOSIS — I480 Paroxysmal atrial fibrillation: Secondary | ICD-10-CM | POA: Diagnosis not present

## 2017-09-09 DIAGNOSIS — D649 Anemia, unspecified: Secondary | ICD-10-CM | POA: Diagnosis not present

## 2017-09-09 DIAGNOSIS — I213 ST elevation (STEMI) myocardial infarction of unspecified site: Secondary | ICD-10-CM

## 2017-09-09 DIAGNOSIS — Z7901 Long term (current) use of anticoagulants: Secondary | ICD-10-CM | POA: Insufficient documentation

## 2017-09-09 DIAGNOSIS — E119 Type 2 diabetes mellitus without complications: Secondary | ICD-10-CM | POA: Insufficient documentation

## 2017-09-09 DIAGNOSIS — R197 Diarrhea, unspecified: Secondary | ICD-10-CM | POA: Insufficient documentation

## 2017-09-09 DIAGNOSIS — I252 Old myocardial infarction: Secondary | ICD-10-CM | POA: Diagnosis not present

## 2017-09-09 DIAGNOSIS — J449 Chronic obstructive pulmonary disease, unspecified: Secondary | ICD-10-CM | POA: Diagnosis not present

## 2017-09-09 DIAGNOSIS — I5042 Chronic combined systolic (congestive) and diastolic (congestive) heart failure: Secondary | ICD-10-CM | POA: Insufficient documentation

## 2017-09-09 LAB — BASIC METABOLIC PANEL
Anion gap: 6 (ref 5–15)
BUN: 24 mg/dL — ABNORMAL HIGH (ref 6–20)
CHLORIDE: 111 mmol/L (ref 101–111)
CO2: 21 mmol/L — ABNORMAL LOW (ref 22–32)
CREATININE: 1.22 mg/dL (ref 0.61–1.24)
Calcium: 9.4 mg/dL (ref 8.9–10.3)
GFR calc Af Amer: >60 mL/min (ref >60)
GFR calc non Af Amer: 57 mL/min — ABNORMAL LOW (ref >60)
GLUCOSE: 130 mg/dL — AB (ref 65–99)
Potassium: 4.9 mmol/L (ref 3.5–5.1)
SODIUM: 138 mmol/L (ref 135–145)

## 2017-09-09 LAB — FERRITIN: FERRITIN: 188 ng/mL (ref 24–336)

## 2017-09-09 LAB — GLUCOSE, CAPILLARY: Glucose-Capillary: 151 mg/dL — ABNORMAL HIGH (ref 65–99)

## 2017-09-09 LAB — IRON AND TIBC
IRON: 113 ug/dL (ref 45–182)
Saturation Ratios: 41 % — ABNORMAL HIGH (ref 17.9–39.5)
TIBC: 279 ug/dL (ref 250–450)
UIBC: 166 ug/dL

## 2017-09-09 LAB — CBC
HEMATOCRIT: 42.1 % (ref 39.0–52.0)
Hemoglobin: 13.4 g/dL (ref 13.0–17.0)
MCH: 28.6 pg (ref 26.0–34.0)
MCHC: 31.8 g/dL (ref 30.0–36.0)
MCV: 89.8 fL (ref 78.0–100.0)
Platelets: 207 10*3/uL (ref 150–400)
RBC: 4.69 MIL/uL (ref 4.22–5.81)
RDW: 16.7 % — AB (ref 11.5–15.5)
WBC: 9.1 10*3/uL (ref 4.0–10.5)

## 2017-09-09 LAB — DIGOXIN LEVEL: DIGOXIN LVL: 0.2 ng/mL — AB (ref 0.8–2.0)

## 2017-09-09 LAB — MAGNESIUM: MAGNESIUM: 1.9 mg/dL (ref 1.7–2.4)

## 2017-09-09 NOTE — Progress Notes (Signed)
[name] [name] 72 y.o. male DOB: 1945-07-19 MRN: [id number]      Nutrition Note  1. ST elevation myocardial infarction (STEMI), unspecified artery ([place]) 03/31/17   2. 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 Metformin noted  Labs:   Lab Results  Component Value Date   HGBA1C 6.3 (H) 03/31/2017   CBG (last 3)  No results for input(s): GLUCAP in the last 72 hours.  Nutrition Note Spoke with pt. Nutrition Plan and Nutrition Survey goals reviewed with pt. Pt is following a Heart Healthy diet. Pt is diabetic. Last A1c indicates blood glucose well-controlled. Pt does not check his CBG's at home. Pt Actosplus Met recently changed to Metformin due to dx of CHF. Per discussion, pt has had diarrhea over the past week. Pt states diarrhea started after taking Metformin x 1 week. Pt was unaware of GI s/s associated with Metformin initiation. Pt encouraged to call his MD if diarrhea persists. Pt expressed understanding of the information reviewed. Pt aware of nutrition education classes offered.   Nutrition Diagnosis ? Food-and nutrition-related knowledge deficit related to lack of exposure to information as related to diagnosis of: ? CVD ? DM  Nutrition Intervention ? Pt's individual nutrition plan reviewed with pt. ? Benefits of adopting Heart Healthy diet discussed when [employer] reviewed.   ? Pt given handouts for: ? Nutrition I class ? Nutrition II class ? Diabetes Blitz Class   Nutrition Goal(s):  ? Pt to identify and limit food sources of saturated fat, trans fat, and sodium ? Pt to describe the benefit of including fruits, vegetables, whole grains, and low-fat dairy products in a heart healthy meal plan.  Plan:  Pt to attend nutrition classes ? Portion Distortion ? Diabetes Q & A Will provide client-centered nutrition education as part of interdisciplinary care.   Monitor and evaluate progress toward nutrition goal with team.  Derek Mound, M.Ed, RD, LDN, CDE 09/09/2017 2:03 PM

## 2017-09-09 NOTE — Progress Notes (Signed)
HF MD: Dr. Haroldine Laws  HPI: [name] Rockers Padgettis a 72 y.o.Caucasianmalewith h/o DM2, COPD (quit smoking 12/18), severe 3v CAD s/p CABG 12/87/8676, chronic systolic HF due to ICM (EF 25-30%onecho 12/18), HTN, DM2, h/o bladder CA s/p multiple rounds of BCG therapy, and paroxysmal AF.   Pt admitted in 12/18 with anterior STEMI and developed cardiogenic shock requiring Impella support. Urgent cath showed severe 3v CAD as below. While on impella, pt developed sepsis of unclear source with WBC ~ 40K, though did have pseudomonason his UCx. Treated for AECOPD and completed ABX therapy. BCx negative. Stabilized and underwent CABG x 2 with LIMA to LAD and SVG to OM on 04/08/2017. Pt transiently required milrinone but weaned prior to discharge. Required amiodarone for brief post op afib, but had no recurrence.  Presents today for pharmacist-led HF medication titration. At last visit, losartan was increased to 25mg  BID. Pt denies AE, compliance. He reports diarrhea over the past seven days and he has 3-4 episodes per day now improving, but denies fever or chills. Of note, pt recently went on a beach trip and ate out at restaurants frequently. Pt reports occasional episodes of SOB at rest but denies chest pain during these episodes.   Shortness of breath/dyspnea on exertion? yes - occasional SOB without exertion  Orthopnea/PND? no  Edema? no  Lightheadedness/dizziness? no  Daily weights at home? No, checks 3x a week at cardiac rehab (~180 lbs)  Blood pressure/heart rate monitoring at home? No, checks 3x a day at cardiac rehab (usually 120s/60s)  Following low-sodium/fluid-restricted diet? No, ate out regularly on recent beach trip.   HF Medications: Losartan 25mg  BID Carvedilol 3.125mg  BID Digoxin 0.0625mg  daily Spironolactone 25mg  daily  Has the patient been experiencing any side effects to the medications prescribed?  Recent onset diarrhea.  Does the patient have any problems  obtaining medications due to transportation or finances?  No - BCBS/Medicare  Understanding of regimen: good Understanding of indications: good Potential of compliance: good Patient understands to avoid NSAIDs. Patient understands to avoid decongestants.   Pertinent Lab Values:  09/09/17: SCr 1.2 (at BL), BUN 24, Potassium 4.9, Sodium 138, Mag 1.9  5/13: Digoxin 0.2  08/05/17:SCr1.29 (BL 1.3-1.5),BUN 20, Potassium4.4, [id number]  05/23/17: Digoxin 0.5 ng/mL   Vital Signs:  Weight: 178 lbs (dry weight: ~179 lbs)  Blood pressure: 122/62 mmHg   Heart rate: 61 bpm   Assessment: 1. Chronicsystolic CHF (GE36-62%), due toCAD. NYHA classIIsymptoms. -Volume status stable. - Pt was able to walk 1450 feet during 6MWT. Pt reports occasional fatigue while exercising, but otherwise feels as if he has energy and is able to complete his ADLs. - Given recent SOB/CP at rest, diarrhea and well-controlled BP, will avoid making any changes today. - Continue losartan 25mg  BID, carvedilol 3.125mg  BID, digoxin 0.0625mg  daily, spironolactone 25mg  daily. - At F/U visit if vitals and symptoms are improved, consider increasing carvedilol and/or transitioning to [facility]. - Basic disease state pathophysiology, medication indication, mechanism and side effects reviewed at length with patient and he verbalized understanding 2. IDA - Iron deficiency s/p Feraheme x3, last TSat 33%on 08/06/17 and remains stable TSat 41%, Ferritin 188 - Repeat iron studies today.  3. CAD - s/p CABG with LIMA to LAD and SVG to OM on 04/08/2017 -No s/s ischemia -Continue ASA/statin 4. Tobacco abuse: -Smoked 1 ppd PTA for >50 yearsbut quit12/2018 - Pt endorses continued cessation, pt praised and encouraged to continue 5. Hx of bladder CA:  - s/p Multiple rounds of BCG therapy- stable  6. Post op Atrial fibrillation -MaintainingNSR and no evidence of recurrenceper pt - Off amiodarone 7. T2DM -  Recent Hgb A1c 6.3% on 03/31/2017 - Actos recently discontinued, remains on metformin - dose increased about a month ago 8. Recent LV Thrombus 4/19 - On Eliquis 5 mg BID - No bleeding issues noted  9. Diarrhea - Unclear etiology - no recent change to metformin dosing. - Check BMET, Mg, Dig levels   Plan: 1) Medication changes: Based on clinical presentation, vital signs and recent labs will continue current therapy for now with plans to increase carvedilol and/or change losartan to [facility] a future visits. 2) Labs: BMET, Mg, Digoxin, iron panel, CBC 3) Follow-up: MD appointment scheduled 09/30/17.   Bonnita Nasuti Pharm.D. CPP, BCPS Clinical Pharmacist [phone] 09/09/2017 4:24 PM

## 2017-09-09 NOTE — Patient Instructions (Addendum)
Continue taking your medications as prescribed. We collected bloodwork and will contact you with any necessary changes.  Follow-up with Dr. Haroldine Laws on 09/30/17 at 2:40pm.

## 2017-09-11 ENCOUNTER — Encounter (HOSPITAL_COMMUNITY)
Admission: RE | Admit: 2017-09-11 | Discharge: 2017-09-11 | Disposition: A | Discharge status: Home / Self Care | Payer: Medicare Other | Source: Ambulatory Visit | Attending: Internal Medicine | Admitting: Internal Medicine

## 2017-09-11 DIAGNOSIS — Z951 Presence of aortocoronary bypass graft: Secondary | ICD-10-CM

## 2017-09-11 DIAGNOSIS — I213 ST elevation (STEMI) myocardial infarction of unspecified site: Secondary | ICD-10-CM

## 2017-09-11 DIAGNOSIS — D649 Anemia, unspecified: Secondary | ICD-10-CM | POA: Diagnosis not present

## 2017-09-12 ENCOUNTER — Encounter (HOSPITAL_COMMUNITY): Payer: Self-pay

## 2017-09-12 LAB — GLUCOSE, CAPILLARY: Glucose-Capillary: 102 mg/dL — ABNORMAL HIGH (ref 65–99)

## 2017-09-12 NOTE — Progress Notes (Signed)
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  apixaban (ELIQUIS) 5 MG TABS tablet, Take 1 tablet (5 mg total) by mouth 2 (two) times daily., Disp: 60 tablet, Rfl: 6 .  aspirin EC 81 MG tablet, Take 1 tablet (81 mg total) by mouth daily., Disp: , Rfl:  .  atorvastatin (LIPITOR) 80 MG tablet, Take 1 tablet (80 mg total) by mouth at bedtime., Disp: 30 tablet, Rfl: 1 .  carvedilol (COREG) 3.125 MG tablet, Take 1 tablet (3.125 mg total) by mouth 2 (two) times daily., Disp: 60 tablet, Rfl: 3 .  digoxin (LANOXIN) 0.125 MG tablet, Take 0.5 tablets (0.0625 mg total) by mouth daily., Disp: 45 tablet, Rfl: 3 .  losartan (COZAAR) 25 MG tablet, Take 1 tablet (25 mg total) by mouth 2 (two) times daily., Disp: 60 tablet, Rfl: 5 .  metFORMIN (GLUCOPHAGE) 500 MG tablet, Take 500 mg by mouth 2 (two) times daily. , Disp: , Rfl:  .  mometasone (NASONEX) 50 MCG/ACT nasal spray, Place 1-2 sprays into the nose daily as needed for allergies., Disp: , Rfl:  .  spironolactone (ALDACTONE) 25 MG tablet, Take 1 tablet (25 mg total) by mouth daily., Disp: 30 tablet, Rfl: 3  Past Medical History: Past Medical History:  Diagnosis Date  . Bladder cancer ([place])   . Borderline glaucoma   . CHF (congestive heart failure) ([place])   . Coronary artery disease   . Diabetes mellitus type  2, controlled ([place]) ORAL MED  . Essential hypertension     Tobacco Use: Social History   Tobacco Use  Smoking Status Former Smoker  . Packs/day: 1.00  . Years: 40.00  . Pack years: 40.00  . Types: Cigarettes  . Last attempt to quit: 03/31/2017  . Years since quitting: 0.4  Smokeless Tobacco Never Used    Labs: Recent Review Flowsheet Data    Labs for ITP Cardiac and Pulmonary Rehab Latest Ref Rng & Units 04/13/2017 04/14/2017 04/15/2017 04/16/2017 04/16/2017   Cholestrol 0 - 200 mg/dL - - - - -   LDLCALC 0 - 99 mg/dL - - - - -   HDL >40 mg/dL - - - - -   Trlycerides <150 mg/dL - - - - -   Hemoglobin A1c 4.8 - 5.6 % - - - - -   PHART 7.350 - 7.450 - - - - -   PCO2ART 32.0 - 48.0 mmHg - - - - -   HCO3 20.0 - 28.0 mmol/L - - - - -   TCO2 22 - 32 mmol/L - - - - -   ACIDBASEDEF 0.0 - 2.0 mmol/L - - - - -   O2SAT % 53.5 91.5 62.0 47.7 84.2      Capillary Blood Glucose: Lab Results  Component Value Date   GLUCAP 102 (H) 09/11/2017   GLUCAP 151 (H) 09/09/2017   GLUCAP 131 (  H) 08/30/2017   GLUCAP 102 (H) 08/28/2017   GLUCAP 113 (H) 08/26/2017     Exercise Target Goals:    Exercise Program Goal: Individual exercise prescription set using results from initial 6 min walk test and THRR while considering  patient's activity barriers and safety.   Exercise Prescription Goal: Initial exercise prescription builds to 30-45 minutes a day of aerobic activity, 2-3 days per week.  Home exercise guidelines will be given to patient during program as part of exercise prescription that the participant will acknowledge.  Activity Barriers & Risk Stratification: Activity Barriers & Cardiac Risk Stratification - 07/11/17 0925    Activity Barriers & Cardiac Risk Stratification          Activity Barriers  Joint Problems;Deconditioning;Muscular Weakness;Other (comment)    Comments  R hip pain, numbness in feet    Cardiac Risk Stratification  High           6 Minute Walk: 6 Minute  Walk    6 Minute Walk    Row Name 07/11/17 0924 07/11/17 1030 07/11/17 1042   Phase  Initial  no documentation  no documentation   Distance  1349 feet  no documentation  no documentation   Walk Time  6 minutes  no documentation  no documentation   # of Rest Breaks  0  no documentation  no documentation   MPH  no documentation  no documentation  2.6   METS  no documentation  no documentation  3.4   RPE  11  no documentation  no documentation   VO2 Peak  no documentation  no documentation  12.03   Symptoms  Yes (comment)  no documentation  no documentation   Comments  R  hip/shin pain 8/10; numbness in feet, B  no documentation  no documentation   Resting HR  75 bpm  no documentation  no documentation   Resting BP  118/70  no documentation  no documentation   Resting Oxygen Saturation   98 %  no documentation  no documentation   Exercise Oxygen Saturation  during 6 min walk  100 %  no documentation  no documentation   Max Ex. HR  no documentation  118 bpm  no documentation   Max Ex. BP  128/60  no documentation  no documentation       6 Minute Walk    Row Name 07/11/17 1057   2 Minute Post BP  112/66          Oxygen Initial Assessment:   Oxygen Re-Evaluation:   Oxygen Discharge (Final Oxygen Re-Evaluation):   Initial Exercise Prescription: Initial Exercise Prescription - 07/11/17 1000    Date of Initial Exercise RX and Referring Provider          Date  07/11/17    Referring Provider  Bensimhon, Daniel MD        Recumbant Bike          Level  2    Minutes  10    METs  2        NuStep          Level  2    SPM  80    Minutes  15    METs  2        Arm Ergometer          Level  --    Minutes  --    METs  --        Track  Laps  4    Minutes  5    METs  --        Prescription Details          Frequency (times per week)  3    Duration  Progress to 30 minutes of continuous aerobic without signs/symptoms of physical distress         Intensity          THRR 40-80% of Max Heartrate  60-119    Ratings of Perceived Exertion  11-13    Perceived Dyspnea  0-4        Progression          Progression  Continue to progress workloads to maintain intensity without signs/symptoms of physical distress.        Resistance Training          Training Prescription  Yes    Weight  2lbs    Reps  10-15           Perform Capillary Blood Glucose checks as needed.  Exercise Prescription Changes: Exercise Prescription Changes    Response to Exercise    Row Name 07/15/17 1416 07/29/17 1211 08/15/17 1100 09/11/17 1222   Blood Pressure (Admit)  126/70  123/70  105/58  122/60   Blood Pressure (Exercise)  120/70  136/74  138/68  128/68   Blood Pressure (Exit)  110/62  120/70  94/62  98/60   Heart Rate (Admit)  92 bpm  91 bpm  76 bpm  68 bpm   Heart Rate (Exercise)  94 bpm  110 bpm  95 bpm  89 bpm   Heart Rate (Exit)  15 bpm  80 bpm  73 bpm  51 bpm   Rating of Perceived Exertion (Exercise)  11  11  11  12    Symptoms  none  none  noen  none   Comments  pt was oriented to exercise equipment today  pt was oriented to exercise equipment today  no documentation  no documentation   Duration  Continue with 30 min of aerobic exercise without signs/symptoms of physical distress.  Continue with 30 min of aerobic exercise without signs/symptoms of physical distress.  Continue with 30 min of aerobic exercise without signs/symptoms of physical distress.  Continue with 30 min of aerobic exercise without signs/symptoms of physical distress.   Intensity  THRR unchanged  THRR unchanged  THRR unchanged  THRR unchanged       Progression    Row Name 07/15/17 1416 07/29/17 1211 08/15/17 1100 09/11/17 1222   Progression  Continue to progress workloads to maintain intensity without signs/symptoms of physical distress.  Continue to progress workloads to maintain intensity without signs/symptoms of physical distress.  Continue to progress workloads to  maintain intensity without signs/symptoms of physical distress.  Continue to progress workloads to maintain intensity without signs/symptoms of physical distress.   Average METs  2.2  3.8  3.5  2.9       Resistance Training    Row Name 07/15/17 1416 07/29/17 1211 08/15/17 1100 09/11/17 1222   Training Prescription  Yes  Yes  Yes  No relaxation day   Weight  2lbs  3lbs  3lbs  no documentation   Reps  10-15  10-15  10-15  no documentation   Time  no documentation  10 Minutes  10 Minutes  [place] Name 07/15/17 1416 07/29/17 1211 08/15/17 1100 09/11/17 1222  Level  2  3.5  4  no documentation   Minutes  15  15  15   no documentation   METs  2.74  5.6  4.1  no documentation       NuStep    Row Name 07/15/17 1416 07/29/17 1211 08/15/17 1100 09/11/17 1222   Level  no documentation  no documentation  5  7   SPM  no documentation  no documentation  80  70   Minutes  no documentation  no documentation  15  20   METs  no documentation  no documentation  3.8  3.5       Arm Ergometer    Row Name 07/15/17 1416 07/29/17 1211 08/15/17 1100 09/11/17 1222   Level  2  no documentation  no documentation  no documentation   Minutes  5  no documentation  no documentation  no documentation   METs  2.74  no documentation  no documentation  no documentation       Track    Row Name 07/15/17 1416 07/29/17 1211 08/15/17 1100 09/11/17 1222   Laps  5  6  no documentation  7   Minutes  5  10  no documentation  10   METs  no documentation  2.03  no documentation  2.23       Home Exercise Plan    [place] Name 07/15/17 1416 07/29/17 1211 08/15/17 1100 09/11/17 1222   Plans to continue exercise at  no documentation  Home (comment)  Home (comment)  Home (comment)   Frequency  no documentation  Add 2 additional days to program exercise sessions.  Add 2 additional days to program exercise sessions.  Add 2 additional days to program exercise sessions.   Initial Home Exercises  Provided  no documentation  07/29/17  07/29/17  07/29/17          Exercise Comments: Exercise Comments    Row Name 07/15/17 1705 08/15/17 1115 08/15/17 1453 09/10/17 1611   Exercise Comments  Pt was oriented to exercise equipment today. Pt responded well to first exercise session; will continue to monitor activity levels and signs/symptoms of physical distress.  no documentation  Pt is off to a great start with exercise. Pt is responding well to exercise prescription. Will continue monitor pt and progress as tolerated.   Reviewed METs and goals. Pt is tolerating exercise program fairly well and is progressing in activity levels. WIll continue to follow up and monitor pt.      Exercise Goals and Review: Exercise Goals    Exercise Goals    Row Name 07/11/17 0926   Increase Physical Activity  Yes   Intervention  Provide advice, education, support and counseling about physical activity/exercise needs.;Develop an individualized exercise prescription for aerobic and resistive training based on initial evaluation findings, risk stratification, comorbidities and participant's personal goals.   Expected Outcomes  Short Term: Attend rehab on a regular basis to increase amount of physical activity.;Long Term: Exercising regularly at least 3-5 days a week.;Long Term: Add in home exercise to make exercise part of routine and to increase amount of physical activity.   Increase Strength and Stamina  Yes return to fishing   Intervention  Provide advice, education, support and counseling about physical activity/exercise needs.;Develop an individualized exercise prescription for aerobic and resistive training based on initial evaluation findings, risk stratification, comorbidities and participant's personal goals.   Expected Outcomes  Short Term: Increase workloads from initial exercise prescription for resistance, speed,  and METs.;Short Term: Perform resistance training exercises routinely during rehab and add  in resistance training at home;Long Term: Improve cardiorespiratory fitness, muscular endurance and strength as measured by increased METs and functional capacity (6MWT)   Able to understand and use rate of perceived exertion (RPE) scale  Yes   Intervention  Provide education and explanation on how to use RPE scale   Expected Outcomes  Short Term: Able to use RPE daily in rehab to express subjective intensity level;Long Term:  Able to use RPE to guide intensity level when exercising independently   Knowledge and understanding of Target Heart Rate Range (THRR)  Yes   Intervention  Provide education and explanation of THRR including how the numbers were predicted and where they are located for reference   Expected Outcomes  Short Term: Able to state/look up THRR;Long Term: Able to use THRR to govern intensity when exercising independently;Short Term: Able to use daily as guideline for intensity in rehab   Able to check pulse independently  Yes   Intervention  Provide education and demonstration on how to check pulse in carotid and radial arteries.;Review the importance of being able to check your own pulse for safety during independent exercise   Expected Outcomes  Short Term: Able to explain why pulse checking is important during independent exercise;Long Term: Able to check pulse independently and accurately   Understanding of Exercise Prescription  Yes   Intervention  Provide education, explanation, and written materials on patient's individual exercise prescription   Expected Outcomes  Short Term: Able to explain program exercise prescription;Long Term: Able to explain home exercise prescription to exercise independently          Exercise Goals Re-Evaluation : Exercise Goals Re-Evaluation    Exercise Goal Re-Evaluation    Row Name 07/15/17 1707 07/29/17 1654 08/15/17 1115 09/10/17 1612   Exercise Goals Review  Increase Physical Activity;Able to understand and use rate of perceived exertion  (RPE) scale  Increase Physical Activity;Able to understand and use rate of perceived exertion (RPE) scale;Knowledge and understanding of Target Heart Rate Range (THRR);Understanding of Exercise Prescription;Increase Strength and Stamina;Able to check pulse independently  Increase Physical Activity;Able to understand and use rate of perceived exertion (RPE) scale;Knowledge and understanding of Target Heart Rate Range (THRR);Understanding of Exercise Prescription;Increase Strength and Stamina;Able to check pulse independently  Increase Physical Activity;Able to understand and use rate of perceived exertion (RPE) scale;Knowledge and understanding of Target Heart Rate Range (THRR);Understanding of Exercise Prescription;Increase Strength and Stamina;Able to check pulse independently   Comments  Pt has a great understanding of RPE scale and was able to exercise for 30 minutes without difficulty.  Reviewed home exercise with pt today.  Pt plans to walk for exercise for 10' 2-3x/week. Discussed exercise/activity progression. Reviewed THR, pulse, RPE, sign and symptoms,  and when to call 911 or MD.  Also discussed weather considerations and indoor options.  Pt voiced understanding.  pt making progress towards HEP with consistent participation in CR.    Pt is slowly but steadily making progress in cardiac rehab. Pt is often limited due to hip pain but manage to get through routine without exacerbation of pain/discomfort.   Expected Outcomes  Pt will be improve in cardiorespiratory fitness and functional mobility.   Pt will be improve in cardiorespiratory fitness and functional mobility.   Pt will be improve in cardiorespiratory fitness and functional mobility.   Pt will be improve in cardiorespiratory fitness and functional mobility.  Discharge Exercise Prescription (Final Exercise Prescription Changes): Exercise Prescription Changes - 09/11/17 1222    Response to Exercise          Blood Pressure  (Admit)  122/60    Blood Pressure (Exercise)  128/68    Blood Pressure (Exit)  98/60    Heart Rate (Admit)  68 bpm    Heart Rate (Exercise)  89 bpm    Heart Rate (Exit)  51 bpm    Rating of Perceived Exertion (Exercise)  12    Symptoms  none    Duration  Continue with 30 min of aerobic exercise without signs/symptoms of physical distress.    Intensity  THRR unchanged        Progression          Progression  Continue to progress workloads to maintain intensity without signs/symptoms of physical distress.    Average METs  2.9        Resistance Training          Training Prescription  No relaxation day    Time  10 Minutes        NuStep          Level  7    SPM  70    Minutes  20    METs  3.5        Track          Laps  7    Minutes  10    METs  2.23        Home Exercise Plan          Plans to continue exercise at  Home (comment)    Frequency  Add 2 additional days to program exercise sessions.    Initial Home Exercises Provided  07/29/17           Nutrition:  Target Goals: Understanding of nutrition guidelines, daily intake of sodium 1500mg , cholesterol 200mg , calories 30% from fat and 7% or less from saturated fats, daily to have 5 or more servings of fruits and vegetables.  Biometrics: Pre Biometrics - 07/11/17 1142    Pre Biometrics          Height  6\' 1"  (1.854 m)    Weight  179 lb 3.7 oz (81.3 kg)    Waist Circumference  37.75 inches    Hip Circumference  41 inches    Waist to Hip Ratio  0.92 %    BMI (Calculated)  23.65    Triceps Skinfold  13 mm    % Body Fat  24.2 %    Grip Strength  42 kg    Flexibility  9 in    Single Leg Stand  10.41 seconds            Nutrition Therapy Plan and Nutrition Goals: Nutrition Therapy & Goals - 07/11/17 1126    Nutrition Therapy          Diet  Carb Modified, Heart Healthy        Personal Nutrition Goals          Nutrition Goal  Pt to identify and limit food sources of saturated fat, trans fat, and  sodium    Personal Goal #2  Pt to describe the benefit of including fruits, vegetables, whole grains, and low-fat dairy products in a heart healthy meal plan.        Intervention Plan          Intervention  Prescribe, educate and counsel regarding individualized specific  dietary modifications aiming towards targeted core components such as weight, hypertension, lipid management, diabetes, heart failure and other comorbidities.    Expected Outcomes  Short Term Goal: Understand basic principles of dietary content, such as calories, fat, sodium, cholesterol and nutrients.;Long Term Goal: Adherence to prescribed nutrition plan.           Nutrition Assessments: Nutrition Assessments - 07/11/17 1126    MEDFICTS Scores          Pre Score  62           Nutrition Goals Re-Evaluation:   Nutrition Goals Re-Evaluation:   Nutrition Goals Discharge (Final Nutrition Goals Re-Evaluation):   Psychosocial: Target Goals: Acknowledge presence or absence of significant depression and/or stress, maximize coping skills, provide positive support system. Participant is able to verbalize types and ability to use techniques and skills needed for reducing stress and depression.  Initial Review & Psychosocial Screening: Initial Psych Review & Screening - 07/11/17 1137    Initial Review          Current issues with  None Identified        Family Dynamics          Good Support System?  Yes Patient has his wife for support        Barriers          Psychosocial barriers to participate in program  There are no identifiable barriers or psychosocial needs.        Screening Interventions          Interventions  Encouraged to exercise           Quality of Life Scores: Quality of Life - 07/11/17 1037    Quality of Life Scores          Health/Function Pre  21.4 %    Socioeconomic Pre  27.08 %    Psych/Spiritual Pre  23.33 %    Family Pre  27.3 %    GLOBAL Pre  23.75 %          Scores  of 19 and below usually indicate a poorer quality of life in these areas.  A difference of  2-3 points is a clinically meaningful difference.  A difference of 2-3 points in the total score of the Quality of Life Index has been associated with significant improvement in overall quality of life, self-image, physical symptoms, and general health in studies assessing change in quality of life.  PHQ-9: Recent Review Flowsheet Data    Depression screen [facility] 2/9 07/15/2017   Decreased Interest 0   Down, Depressed, Hopeless 0   PHQ - 2 Score 0     Interpretation of Total Score  Total Score Depression Severity:  1-4 = Minimal depression, 5-9 = Mild depression, 10-14 = Moderate depression, 15-19 = Moderately severe depression, 20-27 = Severe depression   Psychosocial Evaluation and Intervention: Psychosocial Evaluation - 07/15/17 1622    Psychosocial Evaluation & Interventions          Interventions  Encouraged to exercise with the program and follow exercise prescription;Stress management education;Relaxation education    Comments  No psychosocial needs identified. No followup needed at this time. Pt enjoys fishing.     Expected Outcomes  Pt will exhibit a positive outlook and good coping skills.     Continue Psychosocial Services   No Follow up required           Psychosocial Re-Evaluation: Psychosocial Re-Evaluation    Psychosocial Re-Evaluation    Row  Name 07/18/17 1129 08/14/17 1704 09/12/17 1423   Current issues with  None Identified  None Identified  None Identified   Comments  no psychosocial needs identified, no interventions necessary.   no psychosocial needs identified, no interventions necessary.   no psychosocial needs identified, no interventions necessary.    Expected Outcomes  pt will exhibit positive outlook with good coping skills.   pt will exhibit positive outlook with good coping skills.   pt will exhibit positive outlook with good coping skills.    Interventions   Encouraged to attend Cardiac Rehabilitation for the exercise  Encouraged to attend Cardiac Rehabilitation for the exercise  Encouraged to attend Cardiac Rehabilitation for the exercise   Continue Psychosocial Services   No Follow up required  No Follow up required  No Follow up required          Psychosocial Discharge (Final Psychosocial Re-Evaluation): Psychosocial Re-Evaluation - 09/12/17 1423    Psychosocial Re-Evaluation          Current issues with  None Identified    Comments  no psychosocial needs identified, no interventions necessary.     Expected Outcomes  pt will exhibit positive outlook with good coping skills.     Interventions  Encouraged to attend Cardiac Rehabilitation for the exercise    Continue Psychosocial Services   No Follow up required           Vocational Rehabilitation: Provide vocational rehab assistance to qualifying candidates.   Vocational Rehab Evaluation & Intervention: Vocational Rehab - 07/11/17 1137    Initial Vocational Rehab Evaluation & Intervention          Assessment shows need for Vocational Rehabilitation  No           Education: Education Goals: Education classes will be provided on a weekly basis, covering required topics. Participant will state understanding/return demonstration of topics presented.  Learning Barriers/Preferences: Learning Barriers/Preferences - 07/11/17 6045    Learning Barriers/Preferences          Learning Barriers  Sight    Learning Preferences  Written Material;Pictoral;Video           Education Topics: Count Your Pulse:  -Group instruction provided by verbal instruction, demonstration, patient participation and written materials to support subject.  Instructors address importance of being able to find your pulse and how to count your pulse when at home without a heart monitor.  Patients get hands on experience counting their pulse with staff help and individually.   Heart Attack, Angina, and Risk  Factor Modification:  -Group instruction provided by verbal instruction, video, and written materials to support subject.  Instructors address signs and symptoms of angina and heart attacks.    Also discuss risk factors for heart disease and how to make changes to improve heart health risk factors.   Functional Fitness:  -Group instruction provided by verbal instruction, demonstration, patient participation, and written materials to support subject.  Instructors address safety measures for doing things around the house.  Discuss how to get up and down off the floor, how to pick things up properly, how to safely get out of a chair without assistance, and balance training.   Meditation and Mindfulness:  -Group instruction provided by verbal instruction, patient participation, and written materials to support subject.  Instructor addresses importance of mindfulness and meditation practice to help reduce stress and improve awareness.  Instructor also leads participants through a meditation exercise.    Stretching for Flexibility and Mobility:  -Group instruction provided  by verbal instruction, patient participation, and written materials to support subject.  Instructors lead participants through series of stretches that are designed to increase flexibility thus improving mobility.  These stretches are additional exercise for major muscle groups that are typically performed during regular warm up and cool down.   Hands Only CPR:  -Group verbal, video, and participation provides a basic overview of AHA guidelines for community CPR. Role-play of emergencies allow participants the opportunity to practice calling for help and chest compression technique with discussion of AED use.   Hypertension: -Group verbal and written instruction that provides a basic overview of hypertension including the most recent diagnostic guidelines, risk factor reduction with self-care instructions and medication  management.    Nutrition I class: Heart Healthy Eating:  -Group instruction provided by PowerPoint slides, verbal discussion, and written materials to support subject matter. The instructor gives an explanation and review of the Therapeutic Lifestyle Changes diet recommendations, which includes a discussion on lipid goals, dietary fat, sodium, fiber, plant stanol/sterol esters, sugar, and the components of a well-balanced, healthy diet. Flowsheet Row CARDIAC REHAB PHASE II EXERCISE from 09/09/2017 in [place]  Date  09/09/17  Educator  RD      Nutrition II class: Lifestyle Skills:  -Group instruction provided by PowerPoint slides, verbal discussion, and written materials to support subject matter. The instructor gives an explanation and review of label reading, grocery shopping for heart health, heart healthy recipe modifications, and ways to make healthier choices when eating out. Flowsheet Row CARDIAC REHAB PHASE II EXERCISE from 09/09/2017 in [place]  Date  09/09/17  Educator  RD      Diabetes Question & Answer:  -Group instruction provided by PowerPoint slides, verbal discussion, and written materials to support subject matter. The instructor gives an explanation and review of diabetes co-morbidities, pre- and post-prandial blood glucose goals, pre-exercise blood glucose goals, signs, symptoms, and treatment of hypoglycemia and hyperglycemia, and foot care basics.   Diabetes Blitz:  -Group instruction provided by PowerPoint slides, verbal discussion, and written materials to support subject matter. The instructor gives an explanation and review of the physiology behind type 1 and type 2 diabetes, diabetes medications and rational behind using different medications, pre- and post-prandial blood glucose recommendations and Hemoglobin A1c goals, diabetes diet, and exercise including blood glucose guidelines for exercising  safely.  Flowsheet Row CARDIAC REHAB PHASE II EXERCISE from 09/09/2017 in [place]  Date  09/09/17  Educator  RD      Portion Distortion:  -Group instruction provided by PowerPoint slides, verbal discussion, written materials, and food models to support subject matter. The instructor gives an explanation of serving size versus portion size, changes in portions sizes over the last 20 years, and what consists of a serving from each food group.   Stress Management:  -Group instruction provided by verbal instruction, video, and written materials to support subject matter.  Instructors review role of stress in heart disease and how to cope with stress positively.     Exercising on Your Own:  -Group instruction provided by verbal instruction, power point, and written materials to support subject.  Instructors discuss benefits of exercise, components of exercise, frequency and intensity of exercise, and end points for exercise.  Also discuss use of nitroglycerin and activating EMS.  Review options of places to exercise outside of rehab.  Review guidelines for sex with heart disease.   Cardiac Drugs I:  -  Group instruction provided by verbal instruction and written materials to support subject.  Instructor reviews cardiac drug classes: antiplatelets, anticoagulants, beta blockers, and statins.  Instructor discusses reasons, side effects, and lifestyle considerations for each drug class.   Cardiac Drugs II:  -Group instruction provided by verbal instruction and written materials to support subject.  Instructor reviews cardiac drug classes: angiotensin converting enzyme inhibitors (ACE-I), angiotensin II receptor blockers (ARBs), nitrates, and calcium channel blockers.  Instructor discusses reasons, side effects, and lifestyle considerations for each drug class.   Anatomy and Physiology of the Circulatory System:  Group verbal and written instruction and models  provide basic cardiac anatomy and physiology, with the coronary electrical and arterial systems. Review of: AMI, Angina, Valve disease, Heart Failure, Peripheral Artery Disease, Cardiac Arrhythmia, Pacemakers, and the ICD.   Other Education:  -Group or individual verbal, written, or video instructions that support the educational goals of the cardiac rehab program.   Holiday Eating Survival Tips:  -Group instruction provided by PowerPoint slides, verbal discussion, and written materials to support subject matter. The instructor gives patients tips, tricks, and techniques to help them not only survive but enjoy the holidays despite the onslaught of food that accompanies the holidays.   Knowledge Questionnaire Score: Knowledge Questionnaire Score - 07/11/17 1028    Knowledge Questionnaire Score          Pre Score  18/24           Core Components/Risk Factors/Patient Goals at Admission: Personal Goals and Risk Factors at Admission - 07/11/17 1043    Core Components/Risk Factors/Patient Goals on Admission          Improve shortness of breath with ADL's  Yes    Intervention  Provide education, individualized exercise plan and daily activity instruction to help decrease symptoms of SOB with activities of daily living.    Expected Outcomes  Long Term: Be able to perform more ADLs without symptoms or delay the onset of symptoms;Short Term: Improve cardiorespiratory fitness to achieve a reduction of symptoms when performing ADLs    Diabetes  Yes    Intervention  Provide education about signs/symptoms and action to take for hypo/hyperglycemia.;Provide education about proper nutrition, including hydration, and aerobic/resistive exercise prescription along with prescribed medications to achieve blood glucose in normal ranges: Fasting glucose 65-99 mg/dL    Expected Outcomes  Short Term: Participant verbalizes understanding of the signs/symptoms and immediate care of hyper/hypoglycemia, proper foot  care and importance of medication, aerobic/resistive exercise and nutrition plan for blood glucose control.;Long Term: Attainment of HbA1C < 7%.    Heart Failure  Yes    Intervention  Provide a combined exercise and nutrition program that is supplemented with education, support and counseling about heart failure. Directed toward relieving symptoms such as shortness of breath, decreased exercise tolerance, and extremity edema.    Expected Outcomes  Improve functional capacity of life;Short term: Attendance in program 2-3 days a week with increased exercise capacity. Reported lower sodium intake. Reported increased fruit and vegetable intake. Reports medication compliance.;Long term: Adoption of self-care skills and reduction of barriers for early signs and symptoms recognition and intervention leading to self-care maintenance.;Short term: Daily weights obtained and reported for increase. Utilizing diuretic protocols set by physician.    Hypertension  Yes    Intervention  Provide education on lifestyle modifcations including regular physical activity/exercise, weight management, moderate sodium restriction and increased consumption of fresh fruit, vegetables, and low fat dairy, alcohol moderation, and smoking cessation.;Monitor prescription use compliance.  Expected Outcomes  Short Term: Continued assessment and intervention until BP is < 140/30mm HG in hypertensive participants. < 130/66mm HG in hypertensive participants with diabetes, heart failure or chronic kidney disease.;Long Term: Maintenance of blood pressure at goal levels.    Lipids  Yes    Intervention  Provide education and support for participant on nutrition & aerobic/resistive exercise along with prescribed medications to achieve LDL 70mg , HDL >40mg .    Expected Outcomes  Short Term: Participant states understanding of desired cholesterol values and is compliant with medications prescribed. Participant is following exercise prescription and  nutrition guidelines.;Long Term: Cholesterol controlled with medications as prescribed, with individualized exercise RX and with personalized nutrition plan. Value goals: LDL < 70mg , HDL > 40 mg.           Core Components/Risk Factors/Patient Goals Review:  Goals and Risk Factor Review    Core Components/Risk Factors/Patient Goals Review    Row Name 07/15/17 1615 08/14/17 1703 09/12/17 1422   Personal Goals Review  Heart Failure;Hypertension;Lipids;Diabetes  Heart Failure;Hypertension;Lipids;Diabetes  Heart Failure;Hypertension;Lipids;Diabetes   Review  Pt with multiple CAD RF. Pt is eager to participate in CR exercises.  Pt with multiple CAD RF. Pt is eager to participate in CR exercises. pt with low normal BP, asymptomatic, tolerates exercise well.    Pt with multiple CAD RF. Pt is eager to participate in CR exercises. pt not interested in monitoring CBG at home.  pt with low normal BP, asymptomatic, tolerates exercise well.     Expected Outcomes  Pt will participate in CR exercises and educational opportunities about nutrition, exercise, and lifestyle modifications.   Pt will participate in CR exercises and educational opportunities about nutrition, exercise, and lifestyle modifications.   Pt will participate in CR exercises and educational opportunities about nutrition, exercise, and lifestyle modifications.           Core Components/Risk Factors/Patient Goals at Discharge (Final Review):  Goals and Risk Factor Review - 09/12/17 1422    Core Components/Risk Factors/Patient Goals Review          Personal Goals Review  Heart Failure;Hypertension;Lipids;Diabetes    Review  Pt with multiple CAD RF. Pt is eager to participate in CR exercises. pt not interested in monitoring CBG at home.  pt with low normal BP, asymptomatic, tolerates exercise well.      Expected Outcomes  Pt will participate in CR exercises and educational opportunities about nutrition, exercise, and lifestyle modifications.             ITP Comments: ITP Comments    Row Name 07/11/17 0921 07/18/17 1127 08/14/17 1702 08/23/17 1352 09/12/17 1421   ITP Comments  Dr. Fransico Him, Medical Director  30 day ITP  review. pt demonstrates willingness to participate in CR group exercise program.   30 day ITP  review. pt demonstrates willingness to participate in CR group exercise program.   pt absent from CR to go fishing which is a personal goal.   30 day ITP review. pt with good attendance and participation in group exercise program.       Comments:

## 2017-09-13 ENCOUNTER — Encounter (HOSPITAL_COMMUNITY)
Admission: RE | Admit: 2017-09-13 | Discharge: 2017-09-13 | Disposition: A | Discharge status: Home / Self Care | Payer: Medicare Other | Source: Ambulatory Visit | Attending: Internal Medicine | Admitting: Internal Medicine

## 2017-09-13 DIAGNOSIS — I213 ST elevation (STEMI) myocardial infarction of unspecified site: Secondary | ICD-10-CM

## 2017-09-13 DIAGNOSIS — D649 Anemia, unspecified: Secondary | ICD-10-CM | POA: Diagnosis not present

## 2017-09-13 DIAGNOSIS — Z951 Presence of aortocoronary bypass graft: Secondary | ICD-10-CM

## 2017-09-13 LAB — GLUCOSE, CAPILLARY: Glucose-Capillary: 116 mg/dL — ABNORMAL HIGH (ref 65–99)

## 2017-09-16 ENCOUNTER — Encounter (HOSPITAL_COMMUNITY)
Admission: RE | Admit: 2017-09-16 | Discharge: 2017-09-16 | Disposition: A | Discharge status: Home / Self Care | Payer: Medicare Other | Source: Ambulatory Visit | Attending: Internal Medicine | Admitting: Internal Medicine

## 2017-09-16 DIAGNOSIS — D649 Anemia, unspecified: Secondary | ICD-10-CM | POA: Diagnosis not present

## 2017-09-16 LAB — GLUCOSE, CAPILLARY: Glucose-Capillary: 102 mg/dL — ABNORMAL HIGH (ref 65–99)

## 2017-09-18 ENCOUNTER — Encounter (HOSPITAL_COMMUNITY)
Admission: RE | Admit: 2017-09-18 | Discharge: 2017-09-18 | Disposition: A | Discharge status: Home / Self Care | Payer: Medicare Other | Source: Ambulatory Visit | Attending: Internal Medicine | Admitting: Internal Medicine

## 2017-09-18 DIAGNOSIS — Z951 Presence of aortocoronary bypass graft: Secondary | ICD-10-CM

## 2017-09-18 DIAGNOSIS — I213 ST elevation (STEMI) myocardial infarction of unspecified site: Secondary | ICD-10-CM

## 2017-09-18 DIAGNOSIS — D649 Anemia, unspecified: Secondary | ICD-10-CM | POA: Diagnosis not present

## 2017-09-18 LAB — GLUCOSE, CAPILLARY: Glucose-Capillary: 116 mg/dL — ABNORMAL HIGH (ref 65–99)

## 2017-09-20 ENCOUNTER — Encounter (HOSPITAL_COMMUNITY)
Admission: RE | Admit: 2017-09-20 | Discharge: 2017-09-20 | Disposition: A | Discharge status: Home / Self Care | Payer: Medicare Other | Source: Ambulatory Visit | Attending: Internal Medicine | Admitting: Internal Medicine

## 2017-09-20 DIAGNOSIS — D649 Anemia, unspecified: Secondary | ICD-10-CM | POA: Diagnosis not present

## 2017-09-20 DIAGNOSIS — Z951 Presence of aortocoronary bypass graft: Secondary | ICD-10-CM

## 2017-09-20 DIAGNOSIS — I213 ST elevation (STEMI) myocardial infarction of unspecified site: Secondary | ICD-10-CM

## 2017-09-20 LAB — GLUCOSE, CAPILLARY: Glucose-Capillary: 129 mg/dL — ABNORMAL HIGH (ref 65–99)

## 2017-09-25 ENCOUNTER — Encounter (HOSPITAL_COMMUNITY)
Admission: RE | Admit: 2017-09-25 | Discharge: 2017-09-25 | Disposition: A | Discharge status: Home / Self Care | Payer: Medicare Other | Source: Ambulatory Visit | Attending: Internal Medicine | Admitting: Internal Medicine

## 2017-09-25 DIAGNOSIS — D649 Anemia, unspecified: Secondary | ICD-10-CM | POA: Diagnosis not present

## 2017-09-25 DIAGNOSIS — I213 ST elevation (STEMI) myocardial infarction of unspecified site: Secondary | ICD-10-CM

## 2017-09-25 DIAGNOSIS — Z951 Presence of aortocoronary bypass graft: Secondary | ICD-10-CM

## 2017-09-25 LAB — GLUCOSE, CAPILLARY: GLUCOSE-CAPILLARY: 118 mg/dL — AB (ref 65–99)

## 2017-09-27 ENCOUNTER — Encounter (HOSPITAL_COMMUNITY)
Admission: RE | Admit: 2017-09-27 | Discharge: 2017-09-27 | Disposition: A | Discharge status: Home / Self Care | Payer: Medicare Other | Source: Ambulatory Visit | Attending: Internal Medicine | Admitting: Internal Medicine

## 2017-09-27 DIAGNOSIS — Z951 Presence of aortocoronary bypass graft: Secondary | ICD-10-CM

## 2017-09-27 DIAGNOSIS — D649 Anemia, unspecified: Secondary | ICD-10-CM | POA: Diagnosis not present

## 2017-09-27 DIAGNOSIS — I213 ST elevation (STEMI) myocardial infarction of unspecified site: Secondary | ICD-10-CM

## 2017-09-27 LAB — GLUCOSE, CAPILLARY: Glucose-Capillary: 136 mg/dL — ABNORMAL HIGH (ref 65–99)

## 2017-09-30 ENCOUNTER — Ambulatory Visit (HOSPITAL_COMMUNITY)
Admission: RE | Admit: 2017-09-30 | Discharge: 2017-09-30 | Disposition: A | Discharge status: Home / Self Care | Payer: Medicare Other | Source: Ambulatory Visit | Attending: Internal Medicine | Admitting: Internal Medicine

## 2017-09-30 ENCOUNTER — Other Ambulatory Visit: Payer: Self-pay

## 2017-09-30 ENCOUNTER — Encounter (HOSPITAL_COMMUNITY): Payer: Self-pay | Admitting: Internal Medicine

## 2017-09-30 ENCOUNTER — Encounter (HOSPITAL_COMMUNITY)
Admission: RE | Admit: 2017-09-30 | Discharge: 2017-09-30 | Disposition: A | Discharge status: Home / Self Care | Payer: Medicare Other | Source: Ambulatory Visit | Attending: Internal Medicine | Admitting: Internal Medicine

## 2017-09-30 VITALS — BP 114/55 | HR 78 | Wt 180.8 lb

## 2017-09-30 DIAGNOSIS — Z8249 Family history of ischemic heart disease and other diseases of the circulatory system: Secondary | ICD-10-CM | POA: Diagnosis not present

## 2017-09-30 DIAGNOSIS — Z87891 Personal history of nicotine dependence: Secondary | ICD-10-CM | POA: Insufficient documentation

## 2017-09-30 DIAGNOSIS — I213 ST elevation (STEMI) myocardial infarction of unspecified site: Secondary | ICD-10-CM

## 2017-09-30 DIAGNOSIS — I5022 Chronic systolic (congestive) heart failure: Secondary | ICD-10-CM | POA: Diagnosis present

## 2017-09-30 DIAGNOSIS — E119 Type 2 diabetes mellitus without complications: Secondary | ICD-10-CM | POA: Insufficient documentation

## 2017-09-30 DIAGNOSIS — I236 Thrombosis of atrium, auricular appendage, and ventricle as current complications following acute myocardial infarction: Secondary | ICD-10-CM | POA: Diagnosis not present

## 2017-09-30 DIAGNOSIS — Z8551 Personal history of malignant neoplasm of bladder: Secondary | ICD-10-CM | POA: Diagnosis not present

## 2017-09-30 DIAGNOSIS — Z7901 Long term (current) use of anticoagulants: Secondary | ICD-10-CM | POA: Insufficient documentation

## 2017-09-30 DIAGNOSIS — I480 Paroxysmal atrial fibrillation: Secondary | ICD-10-CM

## 2017-09-30 DIAGNOSIS — Z7984 Long term (current) use of oral hypoglycemic drugs: Secondary | ICD-10-CM | POA: Insufficient documentation

## 2017-09-30 DIAGNOSIS — I340 Nonrheumatic mitral (valve) insufficiency: Secondary | ICD-10-CM | POA: Insufficient documentation

## 2017-09-30 DIAGNOSIS — H40009 Preglaucoma, unspecified, unspecified eye: Secondary | ICD-10-CM | POA: Diagnosis not present

## 2017-09-30 DIAGNOSIS — I2510 Atherosclerotic heart disease of native coronary artery without angina pectoris: Secondary | ICD-10-CM

## 2017-09-30 DIAGNOSIS — Z9889 Other specified postprocedural states: Secondary | ICD-10-CM | POA: Diagnosis not present

## 2017-09-30 DIAGNOSIS — J441 Chronic obstructive pulmonary disease with (acute) exacerbation: Secondary | ICD-10-CM | POA: Diagnosis not present

## 2017-09-30 DIAGNOSIS — D649 Anemia, unspecified: Secondary | ICD-10-CM | POA: Insufficient documentation

## 2017-09-30 DIAGNOSIS — Z79899 Other long term (current) drug therapy: Secondary | ICD-10-CM | POA: Insufficient documentation

## 2017-09-30 DIAGNOSIS — I5042 Chronic combined systolic (congestive) and diastolic (congestive) heart failure: Secondary | ICD-10-CM | POA: Diagnosis not present

## 2017-09-30 DIAGNOSIS — Z951 Presence of aortocoronary bypass graft: Secondary | ICD-10-CM | POA: Insufficient documentation

## 2017-09-30 DIAGNOSIS — I252 Old myocardial infarction: Secondary | ICD-10-CM | POA: Insufficient documentation

## 2017-09-30 DIAGNOSIS — I110 Hypertensive heart disease with heart failure: Secondary | ICD-10-CM | POA: Insufficient documentation

## 2017-09-30 LAB — BASIC METABOLIC PANEL
Anion gap: 8 (ref 5–15)
BUN: 23 mg/dL — ABNORMAL HIGH (ref 6–20)
CHLORIDE: 108 mmol/L (ref 101–111)
CO2: 22 mmol/L (ref 22–32)
CREATININE: 1.11 mg/dL (ref 0.61–1.24)
Calcium: 9.5 mg/dL (ref 8.9–10.3)
GFR calc Af Amer: >60 mL/min (ref >60)
GFR calc non Af Amer: >60 mL/min (ref >60)
GLUCOSE: 95 mg/dL (ref 65–99)
POTASSIUM: 5.2 mmol/L — AB (ref 3.5–5.1)
Sodium: 138 mmol/L (ref 135–145)

## 2017-09-30 LAB — GLUCOSE, CAPILLARY: Glucose-Capillary: 109 mg/dL — ABNORMAL HIGH (ref 65–99)

## 2017-09-30 MED ORDER — EPLERENONE 25 MG PO TABS: 25.0000 mg | ORAL_TABLET | Freq: Every day | ORAL | 6 refills | Status: DC

## 2017-09-30 NOTE — Patient Instructions (Signed)
Stop Digoxin  Stop Spironolactone  Start Eplerenone 25 mg daily  Lab today  Your physician has requested that you have a cardiac MRI. Cardiac MRI uses a computer to create images of your heart as its beating, producing both still and moving pictures of your heart and major blood vessels. For further information please visit [email or web address] Please follow the instruction sheet given to you today for more information.  ONCE THIS IS APPROVED THROUGH YOUR INSURANCE, THEY WILL CONTACT YOU TO SCHEDULE  Your physician recommends that you schedule a follow-up appointment in: 2 months

## 2017-09-30 NOTE — Progress Notes (Addendum)
Advanced Heart Failure Clinic Note   Primary Cardiologist: Dr. Haroldine Laws   HPI:  [name] [name] is a 72 y.o. 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   Pt admitted in 12/18 with anterior STEMI and developed cardiogenic shock requiring Impella support. Urgent cath showed severe 3v CAD as below. While on impella, pt developed sepsis of unclear source with WBC ~ 40K, though did have pseudomonas on his UCx. Treated for AECOPD and completed ABX therapy. BCx negative. Stabilized and underwent CABG x 2 with LIMA to LAD and SVG to OM on 04/08/2017. Pt transiently required milrinone but weaned prior to discharge. Required amiodarone for brief post op afib, but had no recurrence.   He presents today for regular follow up. Continues to improve. Going to CR regularly. No CP or undue SOB. Getting painful gynecomastia with spiro. SBP usually runs about 100-110. No dizziness.   Echo 08/07/17 EF 35-40% laminated apical clot. (I think 25-30%) and moderate to severe MR. Personally reviewed    Review of systems complete and found to be negative unless listed in HPI.    [facility] 03/31/2017  Prox RCA lesion is 100% stenosed. Dist RCA lesion is 100% stenosed (retrograde filling from collaterals does not go beyond the distal vessel) -->faint collateral filling to the distal PL and PDA from septal perforators and a diffusely diseased AV groove circumflex  LAV Groove lesion is 90% stenosed.  Prox LAD-1 lesion is 95% stenosed. Prox LAD-2 lesion is 85% stenosed. Heavily calcified, tandem lesions in very tortuous segment of the vessel.  Prox Cx to Mid Cx lesion is 70% stenosed. Heavily calcified  There is severe left ventricular systolic dysfunction. The left ventricular ejection fraction is less than 25% by visual estimate.  LV end  diastolic pressure is severely elevated -prior to Impella insertion  Hemodynamic findings consistent with moderate pulmonary hypertension.  Successful Impella insertion with 3.5-3.6 LPM flow's  Echo 04/01/2017 EF 15-20%  Echo 04/16/17 (post op) Improved to 25-30% on 04/16/17  Past Medical History:  Diagnosis Date  . Bladder cancer ([place])   . Borderline glaucoma   . CHF (congestive heart failure) ([place])   . Coronary artery disease   . Diabetes mellitus type 2, controlled ([place]) [facility]  . Essential hypertension     Current Outpatient Medications  Medication Sig Dispense Refill  . apixaban (ELIQUIS) 5 MG TABS tablet Take 1 tablet (5 mg total) by mouth 2 (two) times daily. 60 tablet 6  . atorvastatin (LIPITOR) 80 MG tablet Take 1 tablet (80 mg total) by mouth at bedtime. 30 tablet 1  . carvedilol (COREG) 3.125 MG tablet Take 1 tablet (3.125 mg total) by mouth 2 (two) times daily. 60 tablet 3  . digoxin (LANOXIN) 0.125 MG tablet Take 0.5 tablets (0.0625 mg total) by mouth daily. 45 tablet 3  . losartan (COZAAR) 25 MG tablet Take 1 tablet (25 mg total) by mouth 2 (two) times daily. 60 tablet 5  . metFORMIN (GLUCOPHAGE) 500 MG tablet Take 500 mg by mouth 2 (two) times daily.     . mometasone (NASONEX) 50 MCG/ACT nasal spray Place 1-2 sprays into the nose daily as needed for allergies.    [name] spironolactone (ALDACTONE) 25 MG tablet Take 1 tablet (25 mg total) by mouth daily. 30 tablet 3   No current  facility-administered medications for this encounter.     No Known Allergies    Social History   Socioeconomic History  . Marital status: Married    Spouse name: Not on file  . Number of children: Not on file  . Years of education: Not on file  . Highest education level: Not on file  Occupational History  . Not on file  Social Needs  . Financial resource strain: Not on file  . Food insecurity:    Worry: Not on file    Inability: Not on file  . Transportation needs:    Medical: Not  on file    Non-medical: Not on file  Tobacco Use  . Smoking status: Former Smoker    Packs/day: 1.00    Years: 40.00    Pack years: 40.00    Types: Cigarettes    Last attempt to quit: 03/31/2017    Years since quitting: 0.5  . Smokeless tobacco: Never Used  Substance and Sexual Activity  . Alcohol use: No  . Drug use: No  . Sexual activity: Not on file  Lifestyle  . Physical activity:    Days per week: 0 days    Minutes per session: 0 min  . Stress: Only a little  Relationships  . Social connections:    Talks on phone: Not on file    Gets together: Not on file    Attends religious service: Not on file    Active member of club or organization: Not on file    Attends meetings of clubs or organizations: Not on file    Relationship status: Not on file  . Intimate partner violence:    Fear of current or ex partner: Not on file    Emotionally abused: Not on file    Physically abused: Not on file    Forced sexual activity: Not on file  Other Topics Concern  . Not on file  Social History Narrative  . Not on file   Family History  Problem Relation Age of Onset  . Heart attack Sister   . Heart disease Sister     Vitals:   09/30/17 1452  BP: (!) 114/55  Pulse: 78  SpO2: 99%  Weight: 180 lb 12.8 oz (82 kg)   Wt Readings from Last 3 Encounters:  09/30/17 180 lb 12.8 oz (82 kg)  08/19/17 184 lb 3.2 oz (83.6 kg)  08/05/17 181 lb 3.2 oz (82.2 kg)    PHYSICAL EXAM: General:  Well appearing. No resp difficulty HEENT: normal Neck: supple. no JVD. Carotids 2+ bilat; no bruits. No lymphadenopathy or thryomegaly appreciated. Cor: PMI nondisplaced. Regular rate & rhythm. No rubs, gallops or murmurs. Lungs: clear with mildly decreased BS throughout  Abdomen: soft, nontender, nondistended. No hepatosplenomegaly. No bruits or masses. Good bowel sounds. Extremities: no cyanosis, clubbing, rash, edema Neuro: alert & orientedx3, cranial nerves grossly intact. moves all 4 extremities  w/o difficulty. Affect pleasant   ASSESSMENT & PLAN:  1. Chronic systolic CHF due to ICM  - EF 04/01/17 EF 15-20% -> Improved to 25-30% on 04/16/17 Post op CABG as below.  - Echo 08/07/17 read as EF 35-40% but I reviewed personally and EF 25-30% (reviewed with Dr. Meda Coffee who agrees) + laminated apical clot and moderate to severe MR. RV ok. Will check cMRI to confirm and refer for ICD if EF <= 35% - Stable NYHA II.   - Volume status looks good on exam. Does not need lasix.  - Continue carvedilol 3.125  bid - Stop digoxin - Getting painful gynecomastia with spiro. Switch to eplerenone.  - Continue losartan 12.5 mg BID. BP too low for titration or switch to entresto   2. Mitral regurgitation - moderate to severe on echo - likely functional - will need to follow closely. - if not improving with therapy may need to consider MitraClip  3. CAD - s/p CABG with LIMA to LAD and SVG to OM on 04/08/2017 - No s/s ischemia - Continue ASA/statin/bb and CR.   4. Tobacco abuse: Smoked 1 ppd PTA for > 50 years.  - Remains quit  5. Hx of bladder CA:  - s/p Multiple rounds of BCG therapy. Stable.   6. Paroxysmal Atrial fibrillation - Maintaining NSR. No evidence of recurrence. Off amio - Continue Eliquis  7. LV clot - Laminated - On Eliquis for PAF  8. DM2 - Consider Jardiance at next visit    Glori Bickers, MD 09/30/17

## 2017-10-01 ENCOUNTER — Telehealth (HOSPITAL_COMMUNITY): Payer: Self-pay | Admitting: *Deleted

## 2017-10-01 DIAGNOSIS — I5042 Chronic combined systolic (congestive) and diastolic (congestive) heart failure: Secondary | ICD-10-CM

## 2017-10-01 MED ORDER — EPLERENONE 25 MG PO TABS: 12.5000 mg | ORAL_TABLET | Freq: Every day | ORAL | 6 refills | Status: DC

## 2017-10-01 NOTE — Telephone Encounter (Signed)
-----   Message from Jolaine Artist, MD sent at 09/30/2017 10:06 PM EDT ----- Cut inspra to 12.5  Recheck 7-10 days.

## 2017-10-01 NOTE — Addendum Note (Signed)
Encounter addended by: Jolaine Artist, MD on: 10/01/2017 12:28 AM  Actions taken: Sign clinical note

## 2017-10-01 NOTE — Telephone Encounter (Signed)
Notes recorded by Scarlette Calico, RN on 10/01/2017 at 5:01 PM EDT Pt aware, agreeable and verbalizes understanding, repeat lab sch for 6/12

## 2017-10-02 ENCOUNTER — Encounter (HOSPITAL_COMMUNITY)
Admission: RE | Admit: 2017-10-02 | Discharge: 2017-10-02 | Disposition: A | Discharge status: Home / Self Care | Payer: Medicare Other | Source: Ambulatory Visit | Attending: Internal Medicine | Admitting: Internal Medicine

## 2017-10-02 VITALS — Ht 73.0 in | Wt 181.2 lb

## 2017-10-02 DIAGNOSIS — I213 ST elevation (STEMI) myocardial infarction of unspecified site: Secondary | ICD-10-CM

## 2017-10-02 DIAGNOSIS — D649 Anemia, unspecified: Secondary | ICD-10-CM | POA: Diagnosis not present

## 2017-10-02 DIAGNOSIS — Z951 Presence of aortocoronary bypass graft: Secondary | ICD-10-CM

## 2017-10-02 LAB — GLUCOSE, CAPILLARY: GLUCOSE-CAPILLARY: 121 mg/dL — AB (ref 65–99)

## 2017-10-04 ENCOUNTER — Encounter (HOSPITAL_COMMUNITY)
Admission: RE | Admit: 2017-10-04 | Discharge: 2017-10-04 | Disposition: A | Discharge status: Home / Self Care | Payer: Medicare Other | Source: Ambulatory Visit | Attending: Internal Medicine | Admitting: Internal Medicine

## 2017-10-04 ENCOUNTER — Encounter: Payer: Self-pay | Admitting: Internal Medicine

## 2017-10-04 ENCOUNTER — Telehealth: Payer: Self-pay | Admitting: Internal Medicine

## 2017-10-04 ENCOUNTER — Encounter (HOSPITAL_COMMUNITY): Payer: Self-pay

## 2017-10-04 DIAGNOSIS — I213 ST elevation (STEMI) myocardial infarction of unspecified site: Secondary | ICD-10-CM

## 2017-10-04 DIAGNOSIS — Z951 Presence of aortocoronary bypass graft: Secondary | ICD-10-CM

## 2017-10-04 DIAGNOSIS — D649 Anemia, unspecified: Secondary | ICD-10-CM | POA: Diagnosis not present

## 2017-10-04 LAB — GLUCOSE, CAPILLARY: Glucose-Capillary: 109 mg/dL — ABNORMAL HIGH (ref 65–99)

## 2017-10-04 NOTE — Telephone Encounter (Signed)
Called patient to give him the details of his cardiac MRI, but, he was unavailable and so I could not give his wife the message as there was no DPR on file.  Will have to call him back.

## 2017-10-07 ENCOUNTER — Encounter (HOSPITAL_COMMUNITY)
Admission: RE | Admit: 2017-10-07 | Discharge: 2017-10-07 | Disposition: A | Discharge status: Home / Self Care | Payer: Medicare Other | Source: Ambulatory Visit | Attending: Internal Medicine | Admitting: Internal Medicine

## 2017-10-07 DIAGNOSIS — D649 Anemia, unspecified: Secondary | ICD-10-CM | POA: Diagnosis not present

## 2017-10-07 DIAGNOSIS — Z951 Presence of aortocoronary bypass graft: Secondary | ICD-10-CM

## 2017-10-07 DIAGNOSIS — I213 ST elevation (STEMI) myocardial infarction of unspecified site: Secondary | ICD-10-CM

## 2017-10-09 ENCOUNTER — Ambulatory Visit (HOSPITAL_COMMUNITY)
Admission: RE | Admit: 2017-10-09 | Discharge: 2017-10-09 | Disposition: A | Discharge status: Home / Self Care | Payer: Medicare Other | Source: Ambulatory Visit | Attending: Internal Medicine | Admitting: Internal Medicine

## 2017-10-09 ENCOUNTER — Other Ambulatory Visit (HOSPITAL_COMMUNITY): Payer: Self-pay | Admitting: *Deleted

## 2017-10-09 ENCOUNTER — Encounter (HOSPITAL_COMMUNITY)
Admission: RE | Admit: 2017-10-09 | Discharge: 2017-10-09 | Disposition: A | Discharge status: Home / Self Care | Payer: Medicare Other | Source: Ambulatory Visit | Attending: Internal Medicine | Admitting: Internal Medicine

## 2017-10-09 DIAGNOSIS — Z951 Presence of aortocoronary bypass graft: Secondary | ICD-10-CM

## 2017-10-09 DIAGNOSIS — I213 ST elevation (STEMI) myocardial infarction of unspecified site: Secondary | ICD-10-CM

## 2017-10-09 DIAGNOSIS — I5042 Chronic combined systolic (congestive) and diastolic (congestive) heart failure: Secondary | ICD-10-CM | POA: Diagnosis not present

## 2017-10-09 DIAGNOSIS — D649 Anemia, unspecified: Secondary | ICD-10-CM | POA: Diagnosis not present

## 2017-10-09 LAB — BASIC METABOLIC PANEL
ANION GAP: 11 (ref 5–15)
BUN: 16 mg/dL (ref 6–20)
CALCIUM: 9.7 mg/dL (ref 8.9–10.3)
CO2: 23 mmol/L (ref 22–32)
Chloride: 106 mmol/L (ref 101–111)
Creatinine, Ser: 1.19 mg/dL (ref 0.61–1.24)
GFR, EST NON AFRICAN AMERICAN: 59 mL/min — AB (ref >60)
Glucose, Bld: 89 mg/dL (ref 65–99)
Potassium: 4.7 mmol/L (ref 3.5–5.1)
Sodium: 140 mmol/L (ref 135–145)

## 2017-10-09 MED ORDER — APIXABAN 5 MG PO TABS: 5.0000 mg | ORAL_TABLET | Freq: Two times a day (BID) | ORAL | 3 refills | Status: DC

## 2017-10-10 NOTE — Progress Notes (Signed)
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  apixaban (ELIQUIS) 5 MG TABS tablet, Take 1 tablet (5 mg total) by mouth 2 (two) times daily., Disp: 180 tablet, Rfl: 3 .  atorvastatin (LIPITOR) 80 MG tablet, Take 1 tablet (80 mg total) by mouth at bedtime., Disp: 30 tablet, Rfl: 1 .  carvedilol (COREG) 3.125 MG tablet, Take 1 tablet (3.125 mg total) by mouth 2 (two) times daily., Disp: 60 tablet, Rfl: 3 .  eplerenone (INSPRA) 25 MG tablet, Take 0.5 tablets (12.5 mg total) by mouth daily., Disp: 30 tablet, Rfl: 6 .  losartan (COZAAR) 25 MG tablet, Take 1 tablet (25 mg total) by mouth 2 (two) times daily., Disp: 60 tablet, Rfl: 5 .  metFORMIN (GLUCOPHAGE) 500 MG tablet, Take 500 mg by mouth 2 (two) times daily. , Disp: , Rfl:  .  mometasone (NASONEX) 50 MCG/ACT nasal spray, Place 1-2 sprays into the nose daily as needed for allergies., Disp: , Rfl:   Past Medical History: Past Medical History:  Diagnosis Date  . Bladder cancer ([place])   . Borderline glaucoma   . CHF (congestive heart failure) ([place])   . Coronary artery disease   . Diabetes mellitus type 2, controlled ([place]) ORAL MED  . Essential hypertension     Tobacco Use: Social History   Tobacco Use  Smoking Status Former Smoker  . Packs/day: 1.00  . Years: 40.00  . Pack years: 40.00  .  Types: Cigarettes  . Last attempt to quit: 03/31/2017  . Years since quitting: 0.5  Smokeless Tobacco Never Used    Labs: Recent Review Flowsheet Data    Labs for ITP Cardiac and Pulmonary Rehab Latest Ref Rng & Units 04/13/2017 04/14/2017 04/15/2017 04/16/2017 04/16/2017   Cholestrol 0 - 200 mg/dL - - - - -   LDLCALC 0 - 99 mg/dL - - - - -   HDL >40 mg/dL - - - - -   Trlycerides <150 mg/dL - - - - -   Hemoglobin A1c 4.8 - 5.6 % - - - - -   PHART 7.350 - 7.450 - - - - -   PCO2ART 32.0 - 48.0 mmHg - - - - -   HCO3 20.0 - 28.0 mmol/L - - - - -   TCO2 22 - 32 mmol/L - - - - -   ACIDBASEDEF 0.0 - 2.0 mmol/L - - - - -   O2SAT % 53.5 91.5 62.0 47.7 84.2      Capillary Blood Glucose: Lab Results  Component Value Date   GLUCAP 109 (H) 10/04/2017   GLUCAP 121 (H) 10/02/2017   GLUCAP 109 (H) 09/30/2017   GLUCAP 136 (H) 09/27/2017   GLUCAP 118 (H) 09/25/2017     Exercise Target Goals:    Exercise Program Goal: Individual exercise prescription set using results from initial 6 min walk test and THRR  while considering  patient's activity barriers and safety.   Exercise Prescription Goal: Initial exercise prescription builds to 30-45 minutes a day of aerobic activity, 2-3 days per week.  Home exercise guidelines will be given to patient during program as part of exercise prescription that the participant will acknowledge.  Activity Barriers & Risk Stratification: Activity Barriers & Cardiac Risk Stratification - 07/11/17 0925    Activity Barriers & Cardiac Risk Stratification          Activity Barriers  Joint Problems;Deconditioning;Muscular Weakness;Other (comment)    Comments  R hip pain, numbness in feet    Cardiac Risk Stratification  High           6 Minute Walk: 6 Minute Walk    6 Minute Walk    Row Name 07/11/17 0924 07/11/17 1030 07/11/17 1042   Phase  Initial  no documentation  no documentation   Distance  1349 feet  no documentation  no documentation   Walk Time   6 minutes  no documentation  no documentation   # of Rest Breaks  0  no documentation  no documentation   MPH  no documentation  no documentation  2.6   METS  no documentation  no documentation  3.4   RPE  11  no documentation  no documentation   VO2 Peak  no documentation  no documentation  12.03   Symptoms  Yes (comment)  no documentation  no documentation   Comments  R  hip/shin pain 8/10; numbness in feet, B  no documentation  no documentation   Resting HR  75 bpm  no documentation  no documentation   Resting BP  118/70  no documentation  no documentation   Resting Oxygen Saturation   98 %  no documentation  no documentation   Exercise Oxygen Saturation  during 6 min walk  100 %  no documentation  no documentation   Max Ex. HR  no documentation  118 bpm  no documentation   Max Ex. BP  128/60  no documentation  no documentation       6 Minute Walk    Row Name 07/11/17 1057 10/03/17 0800 10/03/17 0804   Phase  no documentation  Discharge  no documentation   Distance  no documentation  1400 feet  no documentation   Distance % Change  no documentation  3.78 %  no documentation   Distance Feet Change  no documentation  51 ft  no documentation   Walk Time  no documentation  6 minutes  no documentation   # of Rest Breaks  no documentation  0  no documentation   MPH  no documentation  2.7  no documentation   METS  no documentation  3.4  no documentation   RPE  no documentation  14  no documentation   VO2 Peak  no documentation  11.97  no documentation   Symptoms  no documentation  Yes (comment)  no documentation   Comments  no documentation  R  hip/shin pain 8/10; numbness in feet, B  no documentation   Resting HR  no documentation  76 bpm  no documentation   Resting BP  no documentation  102/62  no documentation   Max Ex. HR  no documentation  103 bpm  no documentation   Max Ex. BP  no documentation  142/80  no documentation   2 Minute Post BP  112/66  no documentation  98/62  Oxygen Initial Assessment:   Oxygen Re-Evaluation:   Oxygen Discharge (Final Oxygen Re-Evaluation):   Initial Exercise Prescription: Initial Exercise Prescription - 07/11/17 1000    Date of Initial Exercise RX and Referring Provider          Date  07/11/17    Referring Provider  Bensimhon, Daniel MD        Recumbant Bike          Level  2    Minutes  10    METs  2        NuStep          Level  2    SPM  80    Minutes  15    METs  2        Arm Ergometer          Level  --    Minutes  --    METs  --        Track          Laps  4    Minutes  5    METs  --        Prescription Details          Frequency (times per week)  3    Duration  Progress to 30 minutes of continuous aerobic without signs/symptoms of physical distress        Intensity          THRR 40-80% of Max Heartrate  60-119    Ratings of Perceived Exertion  11-13    Perceived Dyspnea  0-4        Progression          Progression  Continue to progress workloads to maintain intensity without signs/symptoms of physical distress.        Resistance Training          Training Prescription  Yes    Weight  2lbs    Reps  10-15           Perform Capillary Blood Glucose checks as needed.  Exercise Prescription Changes: Exercise Prescription Changes    Response to Exercise    Row Name 07/15/17 1416 07/29/17 1211 08/15/17 1100 09/11/17 1222 09/30/17 1224   Blood Pressure (Admit)  126/70  123/70  105/58  122/60  106/54   Blood Pressure (Exercise)  120/70  136/74  138/68  128/68  140/70   Blood Pressure (Exit)  110/62  120/70  94/62  98/60  104/60   Heart Rate (Admit)  92 bpm  91 bpm  76 bpm  68 bpm  58 bpm   Heart Rate (Exercise)  94 bpm  110 bpm  95 bpm  89 bpm  94 bpm   Heart Rate (Exit)  15 bpm  80 bpm  73 bpm  51 bpm  58 bpm   Rating of Perceived Exertion (Exercise)  11  11  11  12  13    Symptoms  none  none  noen  none  none   Comments  pt was oriented to exercise equipment  today  pt was oriented to exercise equipment today  no documentation  no documentation  no documentation   Duration  Continue with 30 min of aerobic exercise without signs/symptoms of physical distress.  Continue with 30 min of aerobic exercise without signs/symptoms of physical distress.  Continue with 30 min of aerobic exercise without signs/symptoms of physical distress.  Continue with 30 min of aerobic exercise without signs/symptoms  of physical distress.  Continue with 30 min of aerobic exercise without signs/symptoms of physical distress.   Intensity  THRR unchanged  THRR unchanged  THRR unchanged  THRR unchanged  THRR unchanged       Progression    Row Name 07/15/17 1416 07/29/17 1211 08/15/17 1100 09/11/17 1222 09/30/17 1224   Progression  Continue to progress workloads to maintain intensity without signs/symptoms of physical distress.  Continue to progress workloads to maintain intensity without signs/symptoms of physical distress.  Continue to progress workloads to maintain intensity without signs/symptoms of physical distress.  Continue to progress workloads to maintain intensity without signs/symptoms of physical distress.  Continue to progress workloads to maintain intensity without signs/symptoms of physical distress.   Average METs  2.2  3.8  3.5  2.9  4.8       Resistance Training    Row Name 07/15/17 1416 07/29/17 1211 08/15/17 1100 09/11/17 1222 09/30/17 1224   Training Prescription  Yes  Yes  Yes  No relaxation day  no documentation   Weight  2lbs  3lbs  3lbs  no documentation  4lbs   Reps  10-15  10-15  10-15  no documentation  10-15   Time  no documentation  10 Minutes  [place] Name 07/15/17 1416 07/29/17 1211 08/15/17 1100 09/11/17 1222 09/30/17 1224   Level  2  3.5  4  no documentation  no documentation   Minutes  15  15  15   no documentation  no documentation   METs  2.74  5.6  4.1  no documentation  no  documentation       [place] Name 07/15/17 1416 07/29/17 1211 08/15/17 1100 09/11/17 1222 09/30/17 1224   Level  no documentation  no documentation  5  7  7    SPM  no documentation  no documentation  80  70  70   Minutes  no documentation  no documentation  15  20  15    METs  no documentation  no documentation  3.8  3.5  3.5       Arm Ergometer    Row Name 07/15/17 1416 07/29/17 1211 08/15/17 1100 09/11/17 1222 09/30/17 1224   Level  2  no documentation  no documentation  no documentation  no documentation   Minutes  5  no documentation  no documentation  no documentation  no documentation   METs  2.74  no documentation  no documentation  no documentation  no documentation       Rower    Row Name 07/15/17 1416 07/29/17 1211 08/15/17 1100 09/11/17 1222 09/30/17 1224   Level  no documentation  no documentation  no documentation  no documentation  2   Watts  no documentation  no documentation  no documentation  no documentation  43   Minutes  no documentation  no documentation  no documentation  no documentation  15   METs  no documentation  no documentation  no documentation  no documentation  5.5       Track    Row Name 07/15/17 1416 07/29/17 1211 08/15/17 1100 09/11/17 1222 09/30/17 1224   Laps  5  6  no documentation  7  no documentation   Minutes  5  10  no documentation  10  no documentation   METs  no documentation  2.03  no documentation  2.23  no documentation       Home Exercise Plan    Row Name 07/15/17 1416 07/29/17 1211 08/15/17 1100 09/11/17 1222 09/30/17 1224   Plans to continue exercise at  no documentation  Home (comment)  Home (comment)  Home (comment)  Home (comment)   Frequency  no documentation  Add 2 additional days to program exercise sessions.  Add 2 additional days to program exercise sessions.  Add 2 additional days to program exercise sessions.  Add 2 additional days to program exercise sessions.   Initial Home Exercises Provided  no documentation   07/29/17  07/29/17  07/29/17  07/29/17          Exercise Comments: Exercise Comments    Row Name 07/15/17 1705 08/15/17 1115 08/15/17 1453 09/10/17 1611 10/08/17 1550   Exercise Comments  Pt was oriented to exercise equipment today. Pt responded well to first exercise session; will continue to monitor activity levels and signs/symptoms of physical distress.  no documentation  Pt is off to a great start with exercise. Pt is responding well to exercise prescription. Will continue monitor pt and progress as tolerated.   Reviewed METs and goals. Pt is tolerating exercise program fairly well and is progressing in activity levels. WIll continue to follow up and monitor pt.  Reviewed METs and goals. Pt is tolerating exercise program fairly well and is progressing in activity levels. WIll continue to follow up and monitor pt.      Exercise Goals and Review: Exercise Goals    Exercise Goals    Row Name 07/11/17 0926   Increase Physical Activity  Yes   Intervention  Provide advice, education, support and counseling about physical activity/exercise needs.;Develop an individualized exercise prescription for aerobic and resistive training based on initial evaluation findings, risk stratification, comorbidities and participant's personal goals.   Expected Outcomes  Short Term: Attend rehab on a regular basis to increase amount of physical activity.;Long Term: Exercising regularly at least 3-5 days a week.;Long Term: Add in home exercise to make exercise part of routine and to increase amount of physical activity.   Increase Strength and Stamina  Yes return to fishing   Intervention  Provide advice, education, support and counseling about physical activity/exercise needs.;Develop an individualized exercise prescription for aerobic and resistive training based on initial evaluation findings, risk stratification, comorbidities and participant's personal goals.   Expected Outcomes  Short Term: Increase workloads  from initial exercise prescription for resistance, speed, and METs.;Short Term: Perform resistance training exercises routinely during rehab and add in resistance training at home;Long Term: Improve cardiorespiratory fitness, muscular endurance and strength as measured by increased METs and functional capacity (6MWT)   Able to understand and use rate of perceived exertion (RPE) scale  Yes   Intervention  Provide education and explanation on how to use RPE scale   Expected Outcomes  Short Term: Able to use RPE daily in rehab to express subjective intensity level;Long Term:  Able to use RPE to guide intensity level when exercising independently   Knowledge and understanding of Target Heart Rate Range (THRR)  Yes   Intervention  Provide education and explanation of THRR including how the numbers were predicted and where they are located for reference   Expected Outcomes  Short Term: Able to state/look up THRR;Long Term: Able to use THRR to govern intensity when exercising independently;Short Term: Able to use daily as guideline for intensity in rehab   Able to check pulse independently  Yes  Intervention  Provide education and demonstration on how to check pulse in carotid and radial arteries.;Review the importance of being able to check your own pulse for safety during independent exercise   Expected Outcomes  Short Term: Able to explain why pulse checking is important during independent exercise;Long Term: Able to check pulse independently and accurately   Understanding of Exercise Prescription  Yes   Intervention  Provide education, explanation, and written materials on patient's individual exercise prescription   Expected Outcomes  Short Term: Able to explain program exercise prescription;Long Term: Able to explain home exercise prescription to exercise independently          Exercise Goals Re-Evaluation : Exercise Goals Re-Evaluation    Exercise Goal Re-Evaluation    Row Name 07/15/17 1707  07/29/17 1654 08/15/17 1115 09/10/17 1612 10/08/17 1550   Exercise Goals Review  Increase Physical Activity;Able to understand and use rate of perceived exertion (RPE) scale  Increase Physical Activity;Able to understand and use rate of perceived exertion (RPE) scale;Knowledge and understanding of Target Heart Rate Range (THRR);Understanding of Exercise Prescription;Increase Strength and Stamina;Able to check pulse independently  Increase Physical Activity;Able to understand and use rate of perceived exertion (RPE) scale;Knowledge and understanding of Target Heart Rate Range (THRR);Understanding of Exercise Prescription;Increase Strength and Stamina;Able to check pulse independently  Increase Physical Activity;Able to understand and use rate of perceived exertion (RPE) scale;Knowledge and understanding of Target Heart Rate Range (THRR);Understanding of Exercise Prescription;Increase Strength and Stamina;Able to check pulse independently  Increase Physical Activity;Able to understand and use rate of perceived exertion (RPE) scale;Knowledge and understanding of Target Heart Rate Range (THRR);Understanding of Exercise Prescription;Increase Strength and Stamina;Able to check pulse independently   Comments  Pt has a great understanding of RPE scale and was able to exercise for 30 minutes without difficulty.  Reviewed home exercise with pt today.  Pt plans to walk for exercise for 10' 2-3x/week. Discussed exercise/activity progression. Reviewed THR, pulse, RPE, sign and symptoms,  and when to call 911 or MD.  Also discussed weather considerations and indoor options.  Pt voiced understanding.  pt making progress towards HEP with consistent participation in CR.    Pt is slowly but steadily making progress in cardiac rehab. Pt is often limited due to hip pain but manage to get through routine without exacerbation of pain/discomfort.  Pt is making plans to exercise at the [facility] post discharge from cardiac rehab. Pt is also  progressing well in cardiac rehab.   Expected Outcomes  Pt will be improve in cardiorespiratory fitness and functional mobility.   Pt will be improve in cardiorespiratory fitness and functional mobility.   Pt will be improve in cardiorespiratory fitness and functional mobility.   Pt will be improve in cardiorespiratory fitness and functional mobility.   Pt will be improve in cardiorespiratory fitness and functional mobility.            Discharge Exercise Prescription (Final Exercise Prescription Changes): Exercise Prescription Changes - 09/30/17 1224    Response to Exercise          Blood Pressure (Admit)  106/54    Blood Pressure (Exercise)  140/70    Blood Pressure (Exit)  104/60    Heart Rate (Admit)  58 bpm    Heart Rate (Exercise)  94 bpm    Heart Rate (Exit)  58 bpm    Rating of Perceived Exertion (Exercise)  13    Symptoms  none    Duration  Continue with 30 min of aerobic  exercise without signs/symptoms of physical distress.    Intensity  THRR unchanged        Progression          Progression  Continue to progress workloads to maintain intensity without signs/symptoms of physical distress.    Average METs  4.8        Resistance Training          Weight  4lbs    Reps  10-15    Time  10 Minutes        NuStep          Level  7    SPM  70    Minutes  15    METs  3.5        Rower          Level  2    Watts  43    Minutes  15    METs  5.5        Home Exercise Plan          Plans to continue exercise at  Home (comment)    Frequency  Add 2 additional days to program exercise sessions.    Initial Home Exercises Provided  07/29/17           Nutrition:  Target Goals: Understanding of nutrition guidelines, daily intake of sodium 1500mg , cholesterol 200mg , calories 30% from fat and 7% or less from saturated fats, daily to have 5 or more servings of fruits and vegetables.  Biometrics: Pre Biometrics - 07/11/17 1142    Pre Biometrics          Height   6\' 1"  (1.854 m)    Weight  179 lb 3.7 oz (81.3 kg)    Waist Circumference  37.75 inches    Hip Circumference  41 inches    Waist to Hip Ratio  0.92 %    BMI (Calculated)  23.65    Triceps Skinfold  13 mm    % Body Fat  24.2 %    Grip Strength  42 kg    Flexibility  9 in    Single Leg Stand  10.41 seconds          Post Biometrics - 10/03/17 0801     Post  Biometrics          Height  6\' 1"  (1.854 m)    Weight  181 lb 3.5 oz (82.2 kg)    Waist Circumference  37.5 inches    Hip Circumference  40 inches    Waist to Hip Ratio  0.94 %    BMI (Calculated)  23.91    Triceps Skinfold  10 mm    % Body Fat  23.1 %    Grip Strength  42.5 kg    Flexibility  10 in    Single Leg Stand  14.25 seconds           Nutrition Therapy Plan and Nutrition Goals: Nutrition Therapy & Goals - 07/11/17 1126    Nutrition Therapy          Diet  Carb Modified, Heart Healthy        Personal Nutrition Goals          Nutrition Goal  Pt to identify and limit food sources of saturated fat, trans fat, and sodium    Personal Goal #2  Pt to describe the benefit of including fruits, vegetables, whole grains, and low-fat dairy products in a heart healthy meal plan.  Intervention Plan          Intervention  Prescribe, educate and counsel regarding individualized specific dietary modifications aiming towards targeted core components such as weight, hypertension, lipid management, diabetes, heart failure and other comorbidities.    Expected Outcomes  Short Term Goal: Understand basic principles of dietary content, such as calories, fat, sodium, cholesterol and nutrients.;Long Term Goal: Adherence to prescribed nutrition plan.           Nutrition Assessments: Nutrition Assessments - 07/11/17 1126    MEDFICTS Scores          Pre Score  62           Nutrition Goals Re-Evaluation:   Nutrition Goals Re-Evaluation:   Nutrition Goals Discharge (Final Nutrition Goals  Re-Evaluation):   Psychosocial: Target Goals: Acknowledge presence or absence of significant depression and/or stress, maximize coping skills, provide positive support system. Participant is able to verbalize types and ability to use techniques and skills needed for reducing stress and depression.  Initial Review & Psychosocial Screening: Initial Psych Review & Screening - 07/11/17 1137    Initial Review          Current issues with  None Identified        Family Dynamics          Good Support System?  Yes Patient has his wife for support        Barriers          Psychosocial barriers to participate in program  There are no identifiable barriers or psychosocial needs.        Screening Interventions          Interventions  Encouraged to exercise           Quality of Life Scores: Quality of Life - 10/09/17 1655    Quality of Life Scores          Health/Function Pre  21.4 %    Health/Function Post  23.73 %    Health/Function % Change  10.89 %    Socioeconomic Pre  27.08 %    Socioeconomic Post  24.79 %    Socioeconomic % Change   -8.46 %    Psych/Spiritual Pre  23.33 %    Psych/Spiritual Post  26.25 %    Psych/Spiritual % Change  12.52 %    Family Pre  27.3 %    Family Post  28.8 %    Family % Change  5.49 %    GLOBAL Pre  23.75 %    GLOBAL Post  25.18 %    GLOBAL % Change  6.02 %          Scores of 19 and below usually indicate a poorer quality of life in these areas.  A difference of  2-3 points is a clinically meaningful difference.  A difference of 2-3 points in the total score of the Quality of Life Index has been associated with significant improvement in overall quality of life, self-image, physical symptoms, and general health in studies assessing change in quality of life.  PHQ-9: Recent Review Flowsheet Data    Depression screen [facility] 2/9 07/15/2017   Decreased Interest 0   Down, Depressed, Hopeless 0   PHQ - 2 Score 0     Interpretation of Total  Score  Total Score Depression Severity:  1-4 = Minimal depression, 5-9 = Mild depression, 10-14 = Moderate depression, 15-19 = Moderately severe depression, 20-27 = Severe depression   Psychosocial Evaluation and Intervention:  Psychosocial Evaluation - 07/15/17 1622    Psychosocial Evaluation & Interventions          Interventions  Encouraged to exercise with the program and follow exercise prescription;Stress management education;Relaxation education    Comments  No psychosocial needs identified. No followup needed at this time. Pt enjoys fishing.     Expected Outcomes  Pt will exhibit a positive outlook and good coping skills.     Continue Psychosocial Services   No Follow up required           Psychosocial Re-Evaluation: Psychosocial Re-Evaluation    Psychosocial Re-Evaluation    [place] Name 07/18/17 1129 08/14/17 1704 09/12/17 1423 10/04/17 1539   Current issues with  None Identified  None Identified  None Identified  None Identified   Comments  no psychosocial needs identified, no interventions necessary.   no psychosocial needs identified, no interventions necessary.   no psychosocial needs identified, no interventions necessary.   no psychosocial needs identified, no interventions necessary.    Expected Outcomes  pt will exhibit positive outlook with good coping skills.   pt will exhibit positive outlook with good coping skills.   pt will exhibit positive outlook with good coping skills.   pt will exhibit positive outlook with good coping skills.    Interventions  Encouraged to attend Cardiac Rehabilitation for the exercise  Encouraged to attend Cardiac Rehabilitation for the exercise  Encouraged to attend Cardiac Rehabilitation for the exercise  Encouraged to attend Cardiac Rehabilitation for the exercise   Continue Psychosocial Services   No Follow up required  No Follow up required  No Follow up required  No Follow up required          Psychosocial Discharge (Final Psychosocial  Re-Evaluation): Psychosocial Re-Evaluation - 10/04/17 1539    Psychosocial Re-Evaluation          Current issues with  None Identified    Comments  no psychosocial needs identified, no interventions necessary.     Expected Outcomes  pt will exhibit positive outlook with good coping skills.     Interventions  Encouraged to attend Cardiac Rehabilitation for the exercise    Continue Psychosocial Services   No Follow up required           Vocational Rehabilitation: Provide vocational rehab assistance to qualifying candidates.   Vocational Rehab Evaluation & Intervention: Vocational Rehab - 07/11/17 1137    Initial Vocational Rehab Evaluation & Intervention          Assessment shows need for Vocational Rehabilitation  No           Education: Education Goals: Education classes will be provided on a weekly basis, covering required topics. Participant will state understanding/return demonstration of topics presented.  Learning Barriers/Preferences: Learning Barriers/Preferences - 07/11/17 0867    Learning Barriers/Preferences          Learning Barriers  Sight    Learning Preferences  Written Material;Pictoral;Video           Education Topics: Count Your Pulse:  -Group instruction provided by verbal instruction, demonstration, patient participation and written materials to support subject.  Instructors address importance of being able to find your pulse and how to count your pulse when at home without a heart monitor.  Patients get hands on experience counting their pulse with staff help and individually.   Heart Attack, Angina, and Risk Factor Modification:  -Group instruction provided by verbal instruction, video, and written materials to support subject.  Instructors address signs and  symptoms of angina and heart attacks.    Also discuss risk factors for heart disease and how to make changes to improve heart health risk factors.   Functional Fitness:  -Group instruction  provided by verbal instruction, demonstration, patient participation, and written materials to support subject.  Instructors address safety measures for doing things around the house.  Discuss how to get up and down off the floor, how to pick things up properly, how to safely get out of a chair without assistance, and balance training.   Meditation and Mindfulness:  -Group instruction provided by verbal instruction, patient participation, and written materials to support subject.  Instructor addresses importance of mindfulness and meditation practice to help reduce stress and improve awareness.  Instructor also leads participants through a meditation exercise.    Stretching for Flexibility and Mobility:  -Group instruction provided by verbal instruction, patient participation, and written materials to support subject.  Instructors lead participants through series of stretches that are designed to increase flexibility thus improving mobility.  These stretches are additional exercise for major muscle groups that are typically performed during regular warm up and cool down.   Hands Only CPR:  -Group verbal, video, and participation provides a basic overview of AHA guidelines for community CPR. Role-play of emergencies allow participants the opportunity to practice calling for help and chest compression technique with discussion of AED use.   Hypertension: -Group verbal and written instruction that provides a basic overview of hypertension including the most recent diagnostic guidelines, risk factor reduction with self-care instructions and medication management.    Nutrition I class: Heart Healthy Eating:  -Group instruction provided by PowerPoint slides, verbal discussion, and written materials to support subject matter. The instructor gives an explanation and review of the Therapeutic Lifestyle Changes diet recommendations, which includes a discussion on lipid goals, dietary fat, sodium, fiber,  plant stanol/sterol esters, sugar, and the components of a well-balanced, healthy diet. Flowsheet Row CARDIAC REHAB PHASE II EXERCISE from 09/09/2017 in [place]  Date  09/09/17  Educator  RD      Nutrition II class: Lifestyle Skills:  -Group instruction provided by PowerPoint slides, verbal discussion, and written materials to support subject matter. The instructor gives an explanation and review of label reading, grocery shopping for heart health, heart healthy recipe modifications, and ways to make healthier choices when eating out. Flowsheet Row CARDIAC REHAB PHASE II EXERCISE from 09/09/2017 in [place]  Date  09/09/17  Educator  RD      Diabetes Question & Answer:  -Group instruction provided by PowerPoint slides, verbal discussion, and written materials to support subject matter. The instructor gives an explanation and review of diabetes co-morbidities, pre- and post-prandial blood glucose goals, pre-exercise blood glucose goals, signs, symptoms, and treatment of hypoglycemia and hyperglycemia, and foot care basics.   Diabetes Blitz:  -Group instruction provided by PowerPoint slides, verbal discussion, and written materials to support subject matter. The instructor gives an explanation and review of the physiology behind type 1 and type 2 diabetes, diabetes medications and rational behind using different medications, pre- and post-prandial blood glucose recommendations and Hemoglobin A1c goals, diabetes diet, and exercise including blood glucose guidelines for exercising safely.  Flowsheet Row CARDIAC REHAB PHASE II EXERCISE from 09/09/2017 in [place]  Date  09/09/17  Educator  RD      Portion Distortion:  -Group instruction provided by PowerPoint slides, verbal discussion, written materials, and food  models to support subject matter. The instructor gives an explanation of serving  size versus portion size, changes in portions sizes over the last 20 years, and what consists of a serving from each food group.   Stress Management:  -Group instruction provided by verbal instruction, video, and written materials to support subject matter.  Instructors review role of stress in heart disease and how to cope with stress positively.     Exercising on Your Own:  -Group instruction provided by verbal instruction, power point, and written materials to support subject.  Instructors discuss benefits of exercise, components of exercise, frequency and intensity of exercise, and end points for exercise.  Also discuss use of nitroglycerin and activating EMS.  Review options of places to exercise outside of rehab.  Review guidelines for sex with heart disease.   Cardiac Drugs I:  -Group instruction provided by verbal instruction and written materials to support subject.  Instructor reviews cardiac drug classes: antiplatelets, anticoagulants, beta blockers, and statins.  Instructor discusses reasons, side effects, and lifestyle considerations for each drug class.   Cardiac Drugs II:  -Group instruction provided by verbal instruction and written materials to support subject.  Instructor reviews cardiac drug classes: angiotensin converting enzyme inhibitors (ACE-I), angiotensin II receptor blockers (ARBs), nitrates, and calcium channel blockers.  Instructor discusses reasons, side effects, and lifestyle considerations for each drug class.   Anatomy and Physiology of the Circulatory System:  Group verbal and written instruction and models provide basic cardiac anatomy and physiology, with the coronary electrical and arterial systems. Review of: AMI, Angina, Valve disease, Heart Failure, Peripheral Artery Disease, Cardiac Arrhythmia, Pacemakers, and the ICD.   Other Education:  -Group or individual verbal, written, or video instructions that support the educational goals of the cardiac rehab  program.   Holiday Eating Survival Tips:  -Group instruction provided by PowerPoint slides, verbal discussion, and written materials to support subject matter. The instructor gives patients tips, tricks, and techniques to help them not only survive but enjoy the holidays despite the onslaught of food that accompanies the holidays.   Knowledge Questionnaire Score: Knowledge Questionnaire Score - 10/09/17 1656    Knowledge Questionnaire Score          Post Score  24/24           Core Components/Risk Factors/Patient Goals at Admission: Personal Goals and Risk Factors at Admission - 07/11/17 1043    Core Components/Risk Factors/Patient Goals on Admission          Improve shortness of breath with ADL's  Yes    Intervention  Provide education, individualized exercise plan and daily activity instruction to help decrease symptoms of SOB with activities of daily living.    Expected Outcomes  Long Term: Be able to perform more ADLs without symptoms or delay the onset of symptoms;Short Term: Improve cardiorespiratory fitness to achieve a reduction of symptoms when performing ADLs    Diabetes  Yes    Intervention  Provide education about signs/symptoms and action to take for hypo/hyperglycemia.;Provide education about proper nutrition, including hydration, and aerobic/resistive exercise prescription along with prescribed medications to achieve blood glucose in normal ranges: Fasting glucose 65-99 mg/dL    Expected Outcomes  Short Term: Participant verbalizes understanding of the signs/symptoms and immediate care of hyper/hypoglycemia, proper foot care and importance of medication, aerobic/resistive exercise and nutrition plan for blood glucose control.;Long Term: Attainment of HbA1C < 7%.    Heart Failure  Yes    Intervention  Provide a combined exercise  and nutrition program that is supplemented with education, support and counseling about heart failure. Directed toward relieving symptoms such as  shortness of breath, decreased exercise tolerance, and extremity edema.    Expected Outcomes  Improve functional capacity of life;Short term: Attendance in program 2-3 days a week with increased exercise capacity. Reported lower sodium intake. Reported increased fruit and vegetable intake. Reports medication compliance.;Long term: Adoption of self-care skills and reduction of barriers for early signs and symptoms recognition and intervention leading to self-care maintenance.;Short term: Daily weights obtained and reported for increase. Utilizing diuretic protocols set by physician.    Hypertension  Yes    Intervention  Provide education on lifestyle modifcations including regular physical activity/exercise, weight management, moderate sodium restriction and increased consumption of fresh fruit, vegetables, and low fat dairy, alcohol moderation, and smoking cessation.;Monitor prescription use compliance.    Expected Outcomes  Short Term: Continued assessment and intervention until BP is < 140/57mm HG in hypertensive participants. < 130/17mm HG in hypertensive participants with diabetes, heart failure or chronic kidney disease.;Long Term: Maintenance of blood pressure at goal levels.    Lipids  Yes    Intervention  Provide education and support for participant on nutrition & aerobic/resistive exercise along with prescribed medications to achieve LDL 70mg , HDL >40mg .    Expected Outcomes  Short Term: Participant states understanding of desired cholesterol values and is compliant with medications prescribed. Participant is following exercise prescription and nutrition guidelines.;Long Term: Cholesterol controlled with medications as prescribed, with individualized exercise RX and with personalized nutrition plan. Value goals: LDL < 70mg , HDL > 40 mg.           Core Components/Risk Factors/Patient Goals Review:  Goals and Risk Factor Review    Core Components/Risk Factors/Patient Goals Review    Row Name  07/15/17 1615 08/14/17 1703 09/12/17 1422 10/04/17 1538   Personal Goals Review  Heart Failure;Hypertension;Lipids;Diabetes  Heart Failure;Hypertension;Lipids;Diabetes  Heart Failure;Hypertension;Lipids;Diabetes  Heart Failure;Hypertension;Lipids;Diabetes   Review  Pt with multiple CAD RF. Pt is eager to participate in CR exercises.  Pt with multiple CAD RF. Pt is eager to participate in CR exercises. pt with low normal BP, asymptomatic, tolerates exercise well.    Pt with multiple CAD RF. Pt is eager to participate in CR exercises. pt not interested in monitoring CBG at home.  pt with low normal BP, asymptomatic, tolerates exercise well.    Pt with multiple CAD RF. Pt is eager to participate in CR exercises. pt notes increased strength/stamina with CR activities. pt is pleased home activities are easier.  pt has joined [employer].     Expected Outcomes  Pt will participate in CR exercises and educational opportunities about nutrition, exercise, and lifestyle modifications.   Pt will participate in CR exercises and educational opportunities about nutrition, exercise, and lifestyle modifications.   Pt will participate in CR exercises and educational opportunities about nutrition, exercise, and lifestyle modifications.   Pt will participate in CR exercises and educational opportunities about nutrition, exercise, and lifestyle modifications.           Core Components/Risk Factors/Patient Goals at Discharge (Final Review):  Goals and Risk Factor Review - 10/04/17 1538    Core Components/Risk Factors/Patient Goals Review          Personal Goals Review  Heart Failure;Hypertension;Lipids;Diabetes    Review  Pt with multiple CAD RF. Pt is eager to participate in CR exercises. pt notes increased strength/stamina with CR activities. pt is pleased home activities are easier.  pt  has joined [employer].      Expected Outcomes  Pt will participate in CR exercises and educational opportunities about nutrition, exercise, and  lifestyle modifications.            ITP Comments: ITP Comments    Row Name 07/11/17 0921 07/18/17 1127 08/14/17 1702 08/23/17 1352 09/12/17 1421   ITP Comments  Dr. Fransico Him, Medical Director  30 day ITP  review. pt demonstrates willingness to participate in CR group exercise program.   30 day ITP  review. pt demonstrates willingness to participate in CR group exercise program.   pt absent from CR to go fishing which is a personal goal.   30 day ITP review. pt with good attendance and participation in group exercise program.       Comments:

## 2017-10-11 ENCOUNTER — Other Ambulatory Visit (HOSPITAL_COMMUNITY): Payer: Self-pay | Admitting: Student

## 2017-10-11 ENCOUNTER — Encounter (HOSPITAL_COMMUNITY)
Admission: RE | Admit: 2017-10-11 | Discharge: 2017-10-11 | Disposition: A | Discharge status: Home / Self Care | Payer: Medicare Other | Source: Ambulatory Visit | Attending: Internal Medicine | Admitting: Internal Medicine

## 2017-10-11 DIAGNOSIS — Z951 Presence of aortocoronary bypass graft: Secondary | ICD-10-CM

## 2017-10-11 DIAGNOSIS — D649 Anemia, unspecified: Secondary | ICD-10-CM | POA: Diagnosis not present

## 2017-10-11 DIAGNOSIS — I213 ST elevation (STEMI) myocardial infarction of unspecified site: Secondary | ICD-10-CM

## 2017-10-11 LAB — GLUCOSE, CAPILLARY: GLUCOSE-CAPILLARY: 115 mg/dL — AB (ref 65–99)

## 2017-10-14 ENCOUNTER — Encounter (HOSPITAL_COMMUNITY)
Admission: RE | Admit: 2017-10-14 | Discharge: 2017-10-14 | Disposition: A | Discharge status: Home / Self Care | Payer: Medicare Other | Source: Ambulatory Visit | Attending: Internal Medicine | Admitting: Internal Medicine

## 2017-10-14 DIAGNOSIS — I213 ST elevation (STEMI) myocardial infarction of unspecified site: Secondary | ICD-10-CM

## 2017-10-14 DIAGNOSIS — D649 Anemia, unspecified: Secondary | ICD-10-CM | POA: Diagnosis not present

## 2017-10-14 DIAGNOSIS — Z951 Presence of aortocoronary bypass graft: Secondary | ICD-10-CM

## 2017-10-16 ENCOUNTER — Ambulatory Visit (HOSPITAL_COMMUNITY)
Admission: RE | Admit: 2017-10-16 | Discharge: 2017-10-16 | Disposition: A | Discharge status: Home / Self Care | Payer: Medicare Other | Source: Ambulatory Visit | Attending: Internal Medicine | Admitting: Internal Medicine

## 2017-10-16 ENCOUNTER — Encounter (HOSPITAL_COMMUNITY)
Admission: RE | Admit: 2017-10-16 | Discharge: 2017-10-16 | Disposition: A | Discharge status: Home / Self Care | Payer: Medicare Other | Source: Ambulatory Visit | Attending: Internal Medicine | Admitting: Internal Medicine

## 2017-10-16 ENCOUNTER — Encounter (HOSPITAL_COMMUNITY): Payer: Self-pay

## 2017-10-16 DIAGNOSIS — D649 Anemia, unspecified: Secondary | ICD-10-CM | POA: Diagnosis not present

## 2017-10-16 DIAGNOSIS — I517 Cardiomegaly: Secondary | ICD-10-CM | POA: Insufficient documentation

## 2017-10-16 DIAGNOSIS — I213 ST elevation (STEMI) myocardial infarction of unspecified site: Secondary | ICD-10-CM

## 2017-10-16 DIAGNOSIS — I513 Intracardiac thrombosis, not elsewhere classified: Secondary | ICD-10-CM | POA: Diagnosis not present

## 2017-10-16 DIAGNOSIS — Z951 Presence of aortocoronary bypass graft: Secondary | ICD-10-CM

## 2017-10-16 DIAGNOSIS — I5042 Chronic combined systolic (congestive) and diastolic (congestive) heart failure: Secondary | ICD-10-CM | POA: Diagnosis not present

## 2017-10-16 MED ORDER — GADOBENATE DIMEGLUMINE 529 MG/ML IV SOLN
26.0000 mL | Freq: Once | INTRAVENOUS | Status: AC | PRN
  Administered 2017-10-16: 26 mL via INTRAVENOUS

## 2017-10-18 DIAGNOSIS — I255 Ischemic cardiomyopathy: Secondary | ICD-10-CM | POA: Diagnosis not present

## 2017-10-22 ENCOUNTER — Telehealth (HOSPITAL_COMMUNITY): Payer: Self-pay | Admitting: *Deleted

## 2017-10-22 DIAGNOSIS — I5042 Chronic combined systolic (congestive) and diastolic (congestive) heart failure: Secondary | ICD-10-CM

## 2017-10-22 NOTE — Telephone Encounter (Signed)
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 Please refer for ICD

## 2017-10-22 NOTE — Telephone Encounter (Signed)
Notes recorded by Scarlette Calico, RN on 10/22/2017 at 4:39 PM EDT Pt aware and agreeable, referral placed

## 2017-10-25 ENCOUNTER — Encounter: Payer: Self-pay | Admitting: Internal Medicine

## 2017-11-04 ENCOUNTER — Encounter: Payer: Self-pay | Admitting: Internal Medicine

## 2017-11-04 ENCOUNTER — Encounter (INDEPENDENT_AMBULATORY_CARE_PROVIDER_SITE_OTHER): Payer: Self-pay

## 2017-11-04 ENCOUNTER — Ambulatory Visit (INDEPENDENT_AMBULATORY_CARE_PROVIDER_SITE_OTHER): Payer: Medicare Other | Admitting: Internal Medicine

## 2017-11-04 VITALS — BP 112/66 | HR 58 | Ht 73.0 in | Wt 185.0 lb

## 2017-11-04 DIAGNOSIS — Z951 Presence of aortocoronary bypass graft: Secondary | ICD-10-CM | POA: Diagnosis not present

## 2017-11-04 DIAGNOSIS — I480 Paroxysmal atrial fibrillation: Secondary | ICD-10-CM | POA: Diagnosis not present

## 2017-11-04 DIAGNOSIS — I5042 Chronic combined systolic (congestive) and diastolic (congestive) heart failure: Secondary | ICD-10-CM

## 2017-11-04 NOTE — Progress Notes (Signed)
ELECTROPHYSIOLOGY CONSULT NOTE  Patient ID: [name] [name], MRN: [id number], DOB/AGE: 10-18-1945 72 y.o. Admit date: (Not on file) Date of Consult: 11/04/2017  Primary Physician: Christain Sacramento, MD Primary Cardiologist: DB     [name] [name] is a 72 y.o. male who is being seen today for the evaluation of ICD at the request of DB.    HPI [name] [name] is a 72 y.o. male with ischemic cardiomyopathy having presented 12/18 with anterior MI and shock requiring impella support.  Further complicated by sepsis.  S/P CABG with postop afib   At this point, he has no chest pain.  He has some shortness of breath with moderate exertion.  He is fatigued.  Is occasional peripheral edema.  He has had no palpitations and no syncope.  He was found to have a laminated LV thrombus.  He has been treated with apixaban.   DATE TEST EF   12/18 LHC  25 % RCA T; LAD 95; Cx 70%  4/19 Echo  25-30% Mod-sev MR  6/19 cMRI 30%         Past Medical History:  Diagnosis Date  . Bladder cancer ([place])   . Borderline glaucoma   . CHF (congestive heart failure) ([place])   . Coronary artery disease   . Diabetes mellitus type 2, controlled ([place]) ORAL MED  . Essential hypertension       Surgical History:  Past Surgical History:  Procedure Laterality Date  . CAROTID DUPLEX SCAN  07-26-2010   BILATERAL ICA  STENOSIS 1% - 39%  . CORONARY ARTERY BYPASS GRAFT N/A 04/08/2017   Procedure: CORONARY ARTERY BYPASS GRAFTING (CABG) TIMES TWO USING LEFT INTERNAL MAMMARY ARTERY AND LEFT SAPHENOUS LEG VEIN HARVESTED ENDOSCOPICALLY.  LEG VEIN ALSO HARVESTED FROM THE RIGHT LEG;  Surgeon: [name], MD;  Location: [facility];  Service: Open Heart Surgery;  Laterality: N/A;  . CYSTOSCOPY WITH BIOPSY N/A 08/01/2012   Procedure: CYSTOSCOPY WITH BIOPSY BLADDER BIOPSY   ;  Surgeon: Fredricka Bonine, MD;  Location: [facility];  Service: Urology;  Laterality: N/A;  . FULGURATION OF BLADDER TUMOR N/A  08/01/2012   Procedure: FULGURATION OF BLADDER TUMOR;  Surgeon: Fredricka Bonine, MD;  Location: [facility];  Service: Urology;  Laterality: N/A;  . LEFT HEART CATH AND CORONARY ANGIOGRAPHY N/A 03/31/2017   Procedure: LEFT HEART CATH AND CORONARY ANGIOGRAPHY;  Surgeon: Leonie Man, MD;  Location: [place] [facility];  Service: Cardiovascular;  Laterality: N/A;  . RIGHT HEART CATH Right 03/31/2017   Procedure: RIGHT HEART CATH;  Surgeon: Leonie Man, MD;  Location: [place] [facility];  Service: Cardiovascular;  Laterality: Right;  . TEE WITHOUT CARDIOVERSION N/A 04/08/2017   Procedure: TRANSESOPHAGEAL ECHOCARDIOGRAM (TEE);  Surgeon: [name], MD;  Location: [place];  Service: Open Heart Surgery;  Laterality: N/A;  . TRANSURETHRAL RESECTION OF BLADDER TUMOR  05/29/2011   Procedure: TRANSURETHRAL RESECTION OF BLADDER TUMOR (TURBT);  Surgeon: Fredricka Bonine, MD;  Location: [facility];  Service: Urology;  Laterality: N/A;  . VENTRICULAR ASSIST DEVICE INSERTION Right 03/31/2017   Procedure: VENTRICULAR ASSIST DEVICE INSERTION;  Surgeon: Leonie Man, MD;  Location: [place] [facility];  Service: Cardiovascular;  Laterality: Right;     Home Meds: Prior to Admission medications   Medication Sig Start Date End Date Taking? Authorizing Provider  apixaban (ELIQUIS) 5 MG TABS tablet Take 1 tablet (5 mg total)  by mouth 2 (two) times daily. 10/09/17  Yes Bensimhon, Shaune Pascal, MD  atorvastatin (LIPITOR) 80 MG tablet Take 1 tablet (80 mg total) by mouth at bedtime. 04/18/17  Yes Gold, Wayne E, PA-C  carvedilol (COREG) 3.125 MG tablet Take 1 tablet (3.125 mg total) by mouth 2 (two) times daily. 07/30/17  Yes Bensimhon, Shaune Pascal, MD  eplerenone (INSPRA) 25 MG tablet Take 25 mg by mouth daily.   Yes [provider]  losartan (COZAAR) 25 MG tablet Take 1 tablet (25 mg total) by mouth 2 (two) times daily. 08/19/17  Yes Bensimhon, Shaune Pascal, MD  metFORMIN  (GLUCOPHAGE) 500 MG tablet Take 500 mg by mouth 2 (two) times daily.    Yes [provider]  mometasone (NASONEX) 50 MCG/ACT nasal spray Place 1-2 sprays into the nose daily as needed for allergies. 12/31/16  Yes [provider]    Allergies: No Known Allergies  Social History   Socioeconomic History  . Marital status: Married    Spouse name: Not on file  . Number of children: Not on file  . Years of education: Not on file  . Highest education level: Not on file  Occupational History  . Not on file  Social Needs  . Financial resource strain: Not on file  . Food insecurity:    Worry: Not on file    Inability: Not on file  . Transportation needs:    Medical: Not on file    Non-medical: Not on file  Tobacco Use  . Smoking status: Former Smoker    Packs/day: 1.00    Years: 40.00    Pack years: 40.00    Types: Cigarettes    Last attempt to quit: 03/31/2017    Years since quitting: 0.5  . Smokeless tobacco: Never Used  Substance and Sexual Activity  . Alcohol use: No  . Drug use: No  . Sexual activity: Not on file  Lifestyle  . Physical activity:    Days per week: 0 days    Minutes per session: 0 min  . Stress: Only a little  Relationships  . Social connections:    Talks on phone: Not on file    Gets together: Not on file    Attends religious service: Not on file    Active member of club or organization: Not on file    Attends meetings of clubs or organizations: Not on file    Relationship status: Not on file  . Intimate partner violence:    Fear of current or ex partner: Not on file    Emotionally abused: Not on file    Physically abused: Not on file    Forced sexual activity: Not on file  Other Topics Concern  . Not on file  Social History Narrative  . Not on file     Family History  Problem Relation Age of Onset  . Heart attack Sister   . Heart disease Sister      ROS:  Please see the history of present illness.     All other systems  reviewed and negative.    Physical Exam: Blood pressure 112/66, pulse (!) 58, height 6\' 1"  (1.854 m), weight 185 lb (83.9 kg), SpO2 98 %. General: Well developed, well nourished male in no acute distress. Head: Normocephalic, atraumatic, sclera non-icteric, no xanthomas, nares are without discharge. EENT: normal  Lymph Nodes:  none Neck: Negative for carotid bruits. JVD not elevated. Back:without scoliosis kyphosis Lungs: Clear bilaterally to auscultation without wheezes,  rales, or rhonchi. Breathing is unlabored. Heart: RRR with S1 S2. No  murmur . No rubs, or gallops appreciated. Abdomen: Soft, non-tender, non-distended with normoactive bowel sounds. No hepatomegaly. No rebound/guarding. No obvious abdominal masses. Msk:  Strength and tone appear normal for age. Extremities: No clubbing or cyanosis. No  edema.  Distal pedal pulses are 2+ and equal bilaterally. Skin: Warm and Dry Neuro: Alert and oriented X 3. CN III-XII intact Grossly normal sensory and motor function . Psych:  Responds to questions appropriately with a normal affect.      Labs: Cardiac Enzymes No results for input(s): CKTOTAL, CKMB, TROPONINI in the last 72 hours. CBC Lab Results  Component Value Date   WBC 9.1 09/09/2017   HGB 13.4 09/09/2017   HCT 42.1 09/09/2017   MCV 89.8 09/09/2017   PLT 207 09/09/2017   PROTIME: No results for input(s): LABPROT, INR in the last 72 hours. Chemistry No results for input(s): NA, K, CL, CO2, BUN, CREATININE, CALCIUM, PROT, BILITOT, ALKPHOS, ALT, AST, GLUCOSE in the last 168 hours.  Invalid input(s): LABALBU Lipids Lab Results  Component Value Date   CHOL 129 04/01/2017   HDL 28 (L) 04/01/2017   LDLCALC 82 04/01/2017   TRIG 93 04/01/2017   BNP No results found for: PROBNP Thyroid Function Tests: No results for input(s): TSH, T4TOTAL, T3FREE, THYROIDAB in the last 72 hours.  Invalid input(s): FREET3 Miscellaneous No results found for:  DDIMER  Radiology/Studies:  Mr Card Morphology Wo/w Cm  Result Date: 10/18/2017 CLINICAL DATA:  Ischemic cardiomyopathy EXAM: CARDIAC MRI TECHNIQUE: The patient was scanned on a 1.5 Tesla GE magnet. A dedicated cardiac coil was used. Functional imaging was done using Fiesta sequences. 2,3, and 4 chamber views were done to assess for RWMA's. Modified Simpson's rule using a short axis stack was used to calculate an ejection fraction on a dedicated work Conservation officer, nature. The patient received 30 cc of Multihance. After 10 minutes inversion recovery sequences were used to assess for infiltration and scar tissue. FINDINGS: Limited images of the lung fields showed no gross abnormalities. S/p sternotomy. Mildly dilated left ventricle with normal wall thickness, EF 30%. Mid to apical anterior and anteroseptal akinesis. Apical lateral and apical inferior akinesis. Akinesis of the true apex. Laminated apical thrombus. Normal right ventricular size and systolic function. Mild left atrial enlargement. Normal right atrium. Trileaflet aortic valve with no stenosis or regurgitation. Visually, mitral regurgitation appeared mild. Flow sequences to quantify MR were not done. Delayed enhancement imaging: Anteroseptal wall: 76-99% wall thickness subendocardial late gadolinium enhancement (LGE) mid anteroseptum, near full thickness LGE apical septum. Inferior wall: 76-99% wall thickness subendocardial LGE apical inferior wall. Lateral wall: 76-99% wall thickness subendocardial LGE apical lateral wall. Anterior wall: 51-75% wall thickness subendocardial LGE mid anterior wall, 76-99% wall thickness subendocardial LGE apical anterior wall. Measurements: LVEDV 303 mL LVSV 90 mL LVEF 30% IMPRESSION: 1. Mildly dilated LV with wall motion abnormalities as noted above, EF 30%. 2.  Laminated apical thrombus. 3.  Normal RV size and systolic function. 4. LGE pattern as noted above, suggesting infarction in the LAD territory. The  affected wall segments would be unlikely to improve with revascularization. Dalton Mclean Electronically Signed   By: Loralie Champagne M.D.   On: 10/18/2017 23:19    EKG: sinus 58 19/10/48 No anterior R waves RAE   Assessment and Plan:  Ischemic cardiomyopathy status post CABG  Congestive heart failure-class II  Atrial fibrillation-paroxysmal postoperative  Laminated clot (still present MRI)  He needs criteria for primary prevention for sudden death.  With his heart failure and is depressed left ventricular function he is appropriately considered.  Even in the era of attenuated benefits as suggested by recent articles and data and if we picked a number half of MADIT-2 with a 1.5 or so absolute mortality benefit, at 6 years at least him to 10% benefit.  This is something he is willing to pursue.  Hence, not withstanding attenuating factors of age, we will proceed following the return of Dr Reine Just  More than 50% of 75 min was spent in counseling related to the above  Virl Axe

## 2017-11-04 NOTE — Patient Instructions (Signed)
Medication Instructions:  Your physician recommends that you continue on your current medications as directed. Please refer to the Current Medication list given to you today.  Labwork: Your physician recommends that you return for lab work on July 18th for a CBC and BMP.   Testing/Procedures: Your physician has recommended that you have a defibrillator inserted. An implantable cardioverter defibrillator (ICD) is a small device that is placed in your chest or, in rare cases, your abdomen. This device uses electrical pulses or shocks to help control life-threatening, irregular heartbeats that could lead the heart to suddenly stop beating (sudden cardiac arrest). Leads are attached to the ICD that goes into your heart. This is done in the hospital and usually requires an overnight stay. Please see the instruction sheet given to you today for more information.   Follow-Up: Your physician recommends that you schedule a follow-up appointment in:   10-14 days with the device clinic for a wound check 91 days with Dr Caryl Comes for a device check  Any Other Special Instructions Will Be Listed Below (If Applicable).     If you need a refill on your cardiac medications before your next appointment, please call your pharmacy.

## 2017-11-04 NOTE — H&P (View-Only) (Signed)
ELECTROPHYSIOLOGY CONSULT NOTE  Patient ID: [name] [name], MRN: [id number], DOB/AGE: [birth year] 72 y.o. Admit date: (Not on file) Date of Consult: 11/04/2017  Primary Physician: Christain Sacramento, MD Primary Cardiologist: DB     [name] [name] is a 72 y.o. male who is being seen today for the evaluation of ICD at the request of DB.    HPI [name] [name] is a 72 y.o. male with ischemic cardiomyopathy having presented 12/18 with anterior MI and shock requiring impella support.  Further complicated by sepsis.  S/P CABG with postop afib   At this point, he has no chest pain.  He has some shortness of breath with moderate exertion.  He is fatigued.  Is occasional peripheral edema.  He has had no palpitations and no syncope.  He was found to have a laminated LV thrombus.  He has been treated with apixaban.   DATE TEST EF   12/18 LHC  25 % RCA T; LAD 95; Cx 70%  4/19 Echo  25-30% Mod-sev MR  6/19 cMRI 30%         Past Medical History:  Diagnosis Date  . Bladder cancer ([place])   . Borderline glaucoma   . CHF (congestive heart failure) ([place])   . Coronary artery disease   . Diabetes mellitus type 2, controlled ([place]) ORAL MED  . Essential hypertension       Surgical History:  Past Surgical History:  Procedure Laterality Date  . CAROTID DUPLEX SCAN  07-26-2010   BILATERAL ICA  STENOSIS 1% - 39%  . CORONARY ARTERY BYPASS GRAFT N/A 04/08/2017   Procedure: CORONARY ARTERY BYPASS GRAFTING (CABG) TIMES TWO USING LEFT INTERNAL MAMMARY ARTERY AND LEFT SAPHENOUS LEG VEIN HARVESTED ENDOSCOPICALLY.  LEG VEIN ALSO HARVESTED FROM THE RIGHT LEG;  Surgeon: [name], MD;  Location: [place] [facility];  Service: Open Heart Surgery;  Laterality: N/A;  . CYSTOSCOPY WITH BIOPSY N/A 08/01/2012   Procedure: CYSTOSCOPY WITH BIOPSY BLADDER BIOPSY   ;  Surgeon: Fredricka Bonine, MD;  Location: [facility];  Service: Urology;  Laterality: N/A;  . FULGURATION OF BLADDER TUMOR N/A  08/01/2012   Procedure: FULGURATION OF BLADDER TUMOR;  Surgeon: Fredricka Bonine, MD;  [facility];  Service: Urology;  Laterality: N/A;  . LEFT HEART CATH AND CORONARY ANGIOGRAPHY N/A 03/31/2017   Procedure: LEFT HEART CATH AND CORONARY ANGIOGRAPHY;  Surgeon: Leonie Man, MD;  Location: [place] [facility];  Service: Cardiovascular;  Laterality: N/A;  . RIGHT HEART CATH Right 03/31/2017   Procedure: RIGHT HEART CATH;  Surgeon: Leonie Man, MD;  Location: [place] [facility];  Service: Cardiovascular;  Laterality: Right;  . TEE WITHOUT CARDIOVERSION N/A 04/08/2017   Procedure: TRANSESOPHAGEAL ECHOCARDIOGRAM (TEE);  Surgeon: [name], MD;  Location: [place];  Service: Open Heart Surgery;  Laterality: N/A;  . TRANSURETHRAL RESECTION OF BLADDER TUMOR  05/29/2011   Procedure: TRANSURETHRAL RESECTION OF BLADDER TUMOR (TURBT);  Surgeon: Fredricka Bonine, MD;  Location: [facility];  Service: Urology;  Laterality: N/A;  . VENTRICULAR ASSIST DEVICE INSERTION Right 03/31/2017   Procedure: VENTRICULAR ASSIST DEVICE INSERTION;  Surgeon: Leonie Man, MD;  Location: [place] [facility];  Service: Cardiovascular;  Laterality: Right;     Home Meds: Prior to Admission medications   Medication Sig Start Date End Date Taking? Authorizing Provider  apixaban (ELIQUIS) 5 MG TABS tablet Take 1 tablet (5 mg total)  by mouth 2 (two) times daily. 10/09/17  Yes Bensimhon, Shaune Pascal, MD  atorvastatin (LIPITOR) 80 MG tablet Take 1 tablet (80 mg total) by mouth at bedtime. 04/18/17  Yes Gold, Wayne E, PA-C  carvedilol (COREG) 3.125 MG tablet Take 1 tablet (3.125 mg total) by mouth 2 (two) times daily. 07/30/17  Yes Bensimhon, Shaune Pascal, MD  eplerenone (INSPRA) 25 MG tablet Take 25 mg by mouth daily.   Yes [provider]  losartan (COZAAR) 25 MG tablet Take 1 tablet (25 mg total) by mouth 2 (two) times daily. 08/19/17  Yes Bensimhon, Shaune Pascal, MD  metFORMIN  (GLUCOPHAGE) 500 MG tablet Take 500 mg by mouth 2 (two) times daily.    Yes [provider]  mometasone (NASONEX) 50 MCG/ACT nasal spray Place 1-2 sprays into the nose daily as needed for allergies. 12/31/16  Yes [provider]    Allergies: No Known Allergies  Social History   Socioeconomic History  . Marital status: Married    Spouse name: Not on file  . Number of children: Not on file  . Years of education: Not on file  . Highest education level: Not on file  Occupational History  . Not on file  Social Needs  . Financial resource strain: Not on file  . Food insecurity:    Worry: Not on file    Inability: Not on file  . Transportation needs:    Medical: Not on file    Non-medical: Not on file  Tobacco Use  . Smoking status: Former Smoker    Packs/day: 1.00    Years: 40.00    Pack years: 40.00    Types: Cigarettes    Last attempt to quit: 03/31/2017    Years since quitting: 0.5  . Smokeless tobacco: Never Used  Substance and Sexual Activity  . Alcohol use: No  . Drug use: No  . Sexual activity: Not on file  Lifestyle  . Physical activity:    Days per week: 0 days    Minutes per session: 0 min  . Stress: Only a little  Relationships  . Social connections:    Talks on phone: Not on file    Gets together: Not on file    Attends religious service: Not on file    Active member of club or organization: Not on file    Attends meetings of clubs or organizations: Not on file    Relationship status: Not on file  . Intimate partner violence:    Fear of current or ex partner: Not on file    Emotionally abused: Not on file    Physically abused: Not on file    Forced sexual activity: Not on file  Other Topics Concern  . Not on file  Social History Narrative  . Not on file     Family History  Problem Relation Age of Onset  . Heart attack Sister   . Heart disease Sister      ROS:  Please see the history of present illness.     All other systems  reviewed and negative.    Physical Exam: Blood pressure 112/66, pulse (!) 58, height 6\' 1"  (1.854 m), weight 185 lb (83.9 kg), SpO2 98 %. General: Well developed, well nourished male in no acute distress. Head: Normocephalic, atraumatic, sclera non-icteric, no xanthomas, nares are without discharge. EENT: normal  Lymph Nodes:  none Neck: Negative for carotid bruits. JVD not elevated. Back:without scoliosis kyphosis Lungs: Clear bilaterally to auscultation without wheezes,  rales, or rhonchi. Breathing is unlabored. Heart: RRR with S1 S2. No  murmur . No rubs, or gallops appreciated. Abdomen: Soft, non-tender, non-distended with normoactive bowel sounds. No hepatomegaly. No rebound/guarding. No obvious abdominal masses. Msk:  Strength and tone appear normal for age. Extremities: No clubbing or cyanosis. No  edema.  Distal pedal pulses are 2+ and equal bilaterally. Skin: Warm and Dry Neuro: Alert and oriented X 3. CN III-XII intact Grossly normal sensory and motor function . Psych:  Responds to questions appropriately with a normal affect.      Labs: Cardiac Enzymes No results for input(s): CKTOTAL, CKMB, TROPONINI in the last 72 hours. CBC Lab Results  Component Value Date   WBC 9.1 09/09/2017   HGB 13.4 09/09/2017   HCT 42.1 09/09/2017   MCV 89.8 09/09/2017   PLT 207 09/09/2017   PROTIME: No results for input(s): LABPROT, INR in the last 72 hours. Chemistry No results for input(s): NA, K, CL, CO2, BUN, CREATININE, CALCIUM, PROT, BILITOT, ALKPHOS, ALT, AST, GLUCOSE in the last 168 hours.  Invalid input(s): LABALBU Lipids Lab Results  Component Value Date   CHOL 129 04/01/2017   HDL 28 (L) 04/01/2017   LDLCALC 82 04/01/2017   TRIG 93 04/01/2017   BNP No results found for: PROBNP Thyroid Function Tests: No results for input(s): TSH, T4TOTAL, T3FREE, THYROIDAB in the last 72 hours.  Invalid input(s): FREET3 Miscellaneous No results found for:  DDIMER  Radiology/Studies:  Mr Card Morphology Wo/w Cm  Result Date: 10/18/2017 CLINICAL DATA:  Ischemic cardiomyopathy EXAM: CARDIAC MRI TECHNIQUE: The patient was scanned on a 1.5 Tesla GE magnet. A dedicated cardiac coil was used. Functional imaging was done using Fiesta sequences. 2,3, and 4 chamber views were done to assess for RWMA's. Modified Simpson's rule using a short axis stack was used to calculate an ejection fraction on a dedicated work Conservation officer, nature. The patient received 30 cc of Multihance. After 10 minutes inversion recovery sequences were used to assess for infiltration and scar tissue. FINDINGS: Limited images of the lung fields showed no gross abnormalities. S/p sternotomy. Mildly dilated left ventricle with normal wall thickness, EF 30%. Mid to apical anterior and anteroseptal akinesis. Apical lateral and apical inferior akinesis. Akinesis of the true apex. Laminated apical thrombus. Normal right ventricular size and systolic function. Mild left atrial enlargement. Normal right atrium. Trileaflet aortic valve with no stenosis or regurgitation. Visually, mitral regurgitation appeared mild. Flow sequences to quantify MR were not done. Delayed enhancement imaging: Anteroseptal wall: 76-99% wall thickness subendocardial late gadolinium enhancement (LGE) mid anteroseptum, near full thickness LGE apical septum. Inferior wall: 76-99% wall thickness subendocardial LGE apical inferior wall. Lateral wall: 76-99% wall thickness subendocardial LGE apical lateral wall. Anterior wall: 51-75% wall thickness subendocardial LGE mid anterior wall, 76-99% wall thickness subendocardial LGE apical anterior wall. Measurements: LVEDV 303 mL LVSV 90 mL LVEF 30% IMPRESSION: 1. Mildly dilated LV with wall motion abnormalities as noted above, EF 30%. 2.  Laminated apical thrombus. 3.  Normal RV size and systolic function. 4. LGE pattern as noted above, suggesting infarction in the LAD territory. The  affected wall segments would be unlikely to improve with revascularization. Dalton Mclean Electronically Signed   By: Loralie Champagne M.D.   On: 10/18/2017 23:19    EKG: sinus 58 19/10/48 No anterior R waves RAE   Assessment and Plan:  Ischemic cardiomyopathy status post CABG  Congestive heart failure-class II  Atrial fibrillation-paroxysmal postoperative  Laminated clot (still present MRI)  He needs criteria for primary prevention for sudden death.  With his heart failure and is depressed left ventricular function he is appropriately considered.  Even in the era of attenuated benefits as suggested by recent articles and data and if we picked a number half of MADIT-2 with a 1.5 or so absolute mortality benefit, at 6 years at least him to 10% benefit.  This is something he is willing to pursue.  Hence, not withstanding attenuating factors of age, we will proceed following the return of Dr Reine Just  More than 50% of 75 min was spent in counseling related to the above  Virl Axe

## 2017-11-07 NOTE — Progress Notes (Signed)
Discharge Progress Report  Patient Details  Name: [name] [name] MRN: [id number] Date of Birth: Jul 16, 1945 Referring Provider:   Flowsheet Row CARDIAC REHAB PHASE II ORIENTATION from 07/11/2017 in [place]  Referring Provider  Glori Bickers MD       Number of Visits: 36  Reason for Discharge:  Patient has met program and personal goals.  Smoking History:  Social History   Tobacco Use  Smoking Status Former Smoker  . Packs/day: 1.00  . Years: 40.00  . Pack years: 40.00  . Types: Cigarettes  . Last attempt to quit: 03/31/2017  . 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  ADL [facility]:   Initial Exercise Prescription: Initial Exercise Prescription - 07/11/17 1000    Date of Initial Exercise RX and Referring Provider          Date  07/11/17    Referring Provider  Glori Bickers MD        Recumbant Bike          Level  2    Minutes  10    METs  2        NuStep          Level  2    SPM  80    Minutes  15    METs  2        Arm Ergometer          Level  --    Minutes  --    METs  --        Track          Laps  4    Minutes  5    METs  --        Prescription Details          Frequency (times per week)  3    Duration  Progress to 30 minutes of continuous aerobic without signs/symptoms of physical distress        Intensity          THRR 40-80% of Max Heartrate  60-119    Ratings of Perceived Exertion  11-13    Perceived Dyspnea  0-4        Progression          Progression  Continue to progress workloads to maintain intensity without signs/symptoms of physical distress.        Resistance Training          Training Prescription  Yes    Weight  2lbs    Reps  10-15           Discharge Exercise Prescription (Final Exercise Prescription Changes): Exercise Prescription  Changes - 10/16/17 1554    Response to Exercise          Blood Pressure (Admit)  124/70    Blood Pressure (Exercise)  140/70    Blood Pressure (Exit)  92/54 asymptomatic    Heart Rate (Admit)  60 bpm    Heart Rate (Exercise)  103 bpm    Heart Rate (Exit)  64 bpm    Rating of Perceived Exertion (Exercise)  12    Symptoms  none    Duration  Continue with 30 min of aerobic exercise without signs/symptoms of physical distress.    Intensity  THRR unchanged        Progression  Progression  Continue to progress workloads to maintain intensity without signs/symptoms of physical distress.    Average METs  4.8        Resistance Training          Training Prescription  No relaxation day    Time  10 Minutes        Bike          Level  1.8    Minutes  15    METs  5.75        NuStep          Level  7    SPM  95    Minutes  15    METs  4.3        Home Exercise Plan          Plans to continue exercise at  Home (comment)    Frequency  Add 2 additional days to program exercise sessions.    Initial Home Exercises Provided  07/29/17           Functional Capacity: 6 Minute Walk    6 Minute Walk    Row Name 07/11/17 0924 07/11/17 1030 07/11/17 1042   Phase  Initial  no documentation  no documentation   Distance  1349 feet  no documentation  no documentation   Walk Time  6 minutes  no documentation  no documentation   # of Rest Breaks  0  no documentation  no documentation   MPH  no documentation  no documentation  2.6   METS  no documentation  no documentation  3.4   RPE  11  no documentation  no documentation   VO2 Peak  no documentation  no documentation  12.03   Symptoms  Yes (comment)  no documentation  no documentation   Comments  R  hip/shin pain 8/10; numbness in feet, B  no documentation  no documentation   Resting HR  75 bpm  no documentation  no documentation   Resting BP  118/70  no documentation  no documentation   Resting Oxygen Saturation   98 %  no  documentation  no documentation   Exercise Oxygen Saturation  during 6 min walk  100 %  no documentation  no documentation   Max Ex. HR  no documentation  118 bpm  no documentation   Max Ex. BP  128/60  no documentation  no documentation       6 Minute Walk    Row Name 07/11/17 1057 10/03/17 0800 10/03/17 0804   Phase  no documentation  Discharge  no documentation   Distance  no documentation  1400 feet  no documentation   Distance % Change  no documentation  3.78 %  no documentation   Distance Feet Change  no documentation  51 ft  no documentation   Walk Time  no documentation  6 minutes  no documentation   # of Rest Breaks  no documentation  0  no documentation   MPH  no documentation  2.7  no documentation   METS  no documentation  3.4  no documentation   RPE  no documentation  14  no documentation   VO2 Peak  no documentation  11.97  no documentation   Symptoms  no documentation  Yes (comment)  no documentation   Comments  no documentation  R  hip/shin pain 8/10; numbness in feet, B  no documentation   Resting HR  no documentation  76 bpm  no documentation   Resting BP  no documentation  102/62  no documentation   Max Ex. HR  no documentation  103 bpm  no documentation   Max Ex. BP  no documentation  142/80  no documentation   2 Minute Post BP  112/66  no documentation  98/62          Psychological, QOL, Others - Outcomes: PHQ 2/9: Depression screen [facility] 2/9 10/16/2017 07/15/2017  Decreased Interest 0 0  Down, Depressed, Hopeless 0 0  PHQ - 2 Score 0 0    Quality of Life: Quality of Life - 10/09/17 1655    Quality of Life Scores          Health/Function Pre  21.4 %    Health/Function Post  23.73 %    Health/Function % Change  10.89 %    Socioeconomic Pre  27.08 %    Socioeconomic Post  24.79 %    Socioeconomic % Change   -8.46 %    Psych/Spiritual Pre  23.33 %    Psych/Spiritual Post  26.25 %    Psych/Spiritual % Change  12.52 %    Family Pre  27.3 %    Family  Post  28.8 %    Family % Change  5.49 %    GLOBAL Pre  23.75 %    GLOBAL Post  25.18 %    GLOBAL % Change  6.02 %           Personal Goals: Goals established at orientation with interventions provided to work toward goal. Personal Goals and Risk Factors at Admission - 07/11/17 1043    Core Components/Risk Factors/Patient Goals on Admission          Improve shortness of breath with ADL's  Yes    Intervention  Provide education, individualized exercise plan and daily activity instruction to help decrease symptoms of SOB with activities of daily living.    Expected Outcomes  Long Term: Be able to perform more ADLs without symptoms or delay the onset of symptoms;Short Term: Improve cardiorespiratory fitness to achieve a reduction of symptoms when performing ADLs    Diabetes  Yes    Intervention  Provide education about signs/symptoms and action to take for hypo/hyperglycemia.;Provide education about proper nutrition, including hydration, and aerobic/resistive exercise prescription along with prescribed medications to achieve blood glucose in normal ranges: Fasting glucose 65-99 mg/dL    Expected Outcomes  Short Term: Participant verbalizes understanding of the signs/symptoms and immediate care of hyper/hypoglycemia, proper foot care and importance of medication, aerobic/resistive exercise and nutrition plan for blood glucose control.;Long Term: Attainment of HbA1C < 7%.    Heart Failure  Yes    Intervention  Provide a combined exercise and nutrition program that is supplemented with education, support and counseling about heart failure. Directed toward relieving symptoms such as shortness of breath, decreased exercise tolerance, and extremity edema.    Expected Outcomes  Improve functional capacity of life;Short term: Attendance in program 2-3 days a week with increased exercise capacity. Reported lower sodium intake. Reported increased fruit and vegetable intake. Reports medication  compliance.;Long term: Adoption of self-care skills and reduction of barriers for early signs and symptoms recognition and intervention leading to self-care maintenance.;Short term: Daily weights obtained and reported for increase. Utilizing diuretic protocols set by physician.    Hypertension  Yes    Intervention  Provide education on lifestyle modifcations including regular physical activity/exercise, weight management, moderate sodium restriction and increased consumption of fresh fruit,  vegetables, and low fat dairy, alcohol moderation, and smoking cessation.;Monitor prescription use compliance.    Expected Outcomes  Short Term: Continued assessment and intervention until BP is < 140/76m HG in hypertensive participants. < 130/845mHG in hypertensive participants with diabetes, heart failure or chronic kidney disease.;Long Term: Maintenance of blood pressure at goal levels.    Lipids  Yes    Intervention  Provide education and support for participant on nutrition & aerobic/resistive exercise along with prescribed medications to achieve LDL '70mg'$ , HDL >'40mg'$ .    Expected Outcomes  Short Term: Participant states understanding of desired cholesterol values and is compliant with medications prescribed. Participant is following exercise prescription and nutrition guidelines.;Long Term: Cholesterol controlled with medications as prescribed, with individualized exercise RX and with personalized nutrition plan. Value goals: LDL < '70mg'$ , HDL > 40 mg.            Personal Goals Discharge: Goals and Risk Factor Review    Core Components/Risk Factors/Patient Goals Review    Row Name 07/15/17 1615 08/14/17 1703 09/12/17 1422 10/04/17 1538 10/16/17 1422   Personal Goals Review  Heart Failure;Hypertension;Lipids;Diabetes  Heart Failure;Hypertension;Lipids;Diabetes  Heart Failure;Hypertension;Lipids;Diabetes  Heart Failure;Hypertension;Lipids;Diabetes  Heart Failure;Hypertension;Lipids;Diabetes   Review  Pt with  multiple CAD RF. Pt is eager to participate in CR exercises.  Pt with multiple CAD RF. Pt is eager to participate in CR exercises. pt with low normal BP, asymptomatic, tolerates exercise well.    Pt with multiple CAD RF. Pt is eager to participate in CR exercises. pt not interested in monitoring CBG at home.  pt with low normal BP, asymptomatic, tolerates exercise well.    Pt with multiple CAD RF. Pt is eager to participate in CR exercises. pt notes increased strength/stamina with CR activities. pt is pleased home activities are easier.  pt has joined [employer]   Pt with multiple CAD RF.  pt notes increased strength/stamina with CR activities. pt is pleased home activities are easier.  pt has joined [employer] pt plans to continue exercising on his own.   Expected Outcomes  Pt will participate in CR exercises and educational opportunities about nutrition, exercise, and lifestyle modifications.   Pt will participate in CR exercises and educational opportunities about nutrition, exercise, and lifestyle modifications.   Pt will participate in CR exercises and educational opportunities about nutrition, exercise, and lifestyle modifications.   Pt will participate in CR exercises and educational opportunities about nutrition, exercise, and lifestyle modifications.   Pt will participate in nutrition, exercise, and lifestyle modifications in the community.          Exercise Goals and Review: Exercise Goals    Exercise Goals    Row Name 07/11/17 0926   Increase Physical Activity  Yes   Intervention  Provide advice, education, support and counseling about physical activity/exercise needs.;Develop an individualized exercise prescription for aerobic and resistive training based on initial evaluation findings, risk stratification, comorbidities and participant's personal goals.   Expected Outcomes  Short Term: Attend rehab on a regular basis to increase amount of physical activity.;Long Term: Exercising regularly at least  3-5 days a week.;Long Term: Add in home exercise to make exercise part of routine and to increase amount of physical activity.   Increase Strength and Stamina  Yes return to fishing   Intervention  Provide advice, education, support and counseling about physical activity/exercise needs.;Develop an individualized exercise prescription for aerobic and resistive training based on initial evaluation findings, risk stratification, comorbidities and participant's personal goals.  Expected Outcomes  Short Term: Increase workloads from initial exercise prescription for resistance, speed, and METs.;Short Term: Perform resistance training exercises routinely during rehab and add in resistance training at home;Long Term: Improve cardiorespiratory fitness, muscular endurance and strength as measured by increased METs and functional capacity (6MWT)   Able to understand and use rate of perceived exertion (RPE) scale  Yes   Intervention  Provide education and explanation on how to use RPE scale   Expected Outcomes  Short Term: Able to use RPE daily in rehab to express subjective intensity level;Long Term:  Able to use RPE to guide intensity level when exercising independently   Knowledge and understanding of Target Heart Rate Range (THRR)  Yes   Intervention  Provide education and explanation of THRR including how the numbers were predicted and where they are located for reference   Expected Outcomes  Short Term: Able to state/look up THRR;Long Term: Able to use THRR to govern intensity when exercising independently;Short Term: Able to use daily as guideline for intensity in rehab   Able to check pulse independently  Yes   Intervention  Provide education and demonstration on how to check pulse in carotid and radial arteries.;Review the importance of being able to check your own pulse for safety during independent exercise   Expected Outcomes  Short Term: Able to explain why pulse checking is important during  independent exercise;Long Term: Able to check pulse independently and accurately   Understanding of Exercise Prescription  Yes   Intervention  Provide education, explanation, and written materials on patient's individual exercise prescription   Expected Outcomes  Short Term: Able to explain program exercise prescription;Long Term: Able to explain home exercise prescription to exercise independently          Nutrition & Weight - Outcomes: Pre Biometrics - 07/11/17 1142    Pre Biometrics          Height  '6\' 1"'$  (1.854 m)    Weight  179 lb 3.7 oz (81.3 kg)    Waist Circumference  37.75 inches    Hip Circumference  41 inches    Waist to Hip Ratio  0.92 %    BMI (Calculated)  23.65    Triceps Skinfold  13 mm    % Body Fat  24.2 %    Grip Strength  42 kg    Flexibility  9 in    Single Leg Stand  10.41 seconds          Post Biometrics - 10/03/17 0801     Post  Biometrics          Height  '6\' 1"'$  (1.854 m)    Weight  181 lb 3.5 oz (82.2 kg)    Waist Circumference  37.5 inches    Hip Circumference  40 inches    Waist to Hip Ratio  0.94 %    BMI (Calculated)  23.91    Triceps Skinfold  10 mm    % Body Fat  23.1 %    Grip Strength  42.5 kg    Flexibility  10 in    Single Leg Stand  14.25 seconds           Nutrition: Nutrition Therapy & Goals - 07/11/17 1126    Nutrition Therapy          Diet  Carb Modified, Heart Healthy        Personal Nutrition Goals          Nutrition Goal  Pt  to identify and limit food sources of saturated fat, trans fat, and sodium    Personal Goal #2  Pt to describe the benefit of including fruits, vegetables, whole grains, and low-fat dairy products in a heart healthy meal plan.        Intervention Plan          Intervention  Prescribe, educate and counsel regarding individualized specific dietary modifications aiming towards targeted core components such as weight, hypertension, lipid management, diabetes, heart failure and other comorbidities.     Expected Outcomes  Short Term Goal: Understand basic principles of dietary content, such as calories, fat, sodium, cholesterol and nutrients.;Long Term Goal: Adherence to prescribed nutrition plan.           Nutrition Discharge: Nutrition Assessments - 11/06/17 1433    MEDFICTS Scores          Pre Score  62    Post Score  27    Score Difference  -35           Education Questionnaire Score: Knowledge Questionnaire Score - 10/09/17 1656    Knowledge Questionnaire Score          Post Score  24/24           Goals reviewed with patient; copy given to patient.

## 2017-11-14 ENCOUNTER — Other Ambulatory Visit: Payer: Medicare Other | Admitting: *Deleted

## 2017-11-14 LAB — CBC WITH DIFFERENTIAL/PLATELET
BASOS ABS: 0 10*3/uL (ref 0.0–0.2)
Basos: 1 %
EOS (ABSOLUTE): 0.2 10*3/uL (ref 0.0–0.4)
Eos: 3 %
Hematocrit: 38.3 % (ref 37.5–51.0)
Hemoglobin: 12.5 g/dL — ABNORMAL LOW (ref 13.0–17.7)
Immature Grans (Abs): 0 10*3/uL (ref 0.0–0.1)
Immature Granulocytes: 0 %
LYMPHS ABS: 2 10*3/uL (ref 0.7–3.1)
Lymphs: 28 %
MCH: 29.1 pg (ref 26.6–33.0)
MCHC: 32.6 g/dL (ref 31.5–35.7)
MCV: 89 fL (ref 79–97)
MONOS ABS: 0.7 10*3/uL (ref 0.1–0.9)
Monocytes: 10 %
NEUTROS PCT: 58 %
Neutrophils Absolute: 4.2 10*3/uL (ref 1.4–7.0)
Platelets: 213 10*3/uL (ref 150–450)
RBC: 4.29 x10E6/uL (ref 4.14–5.80)
RDW: 14.2 % (ref 12.3–15.4)
WBC: 7.1 10*3/uL (ref 3.4–10.8)

## 2017-11-14 LAB — BASIC METABOLIC PANEL
BUN / CREAT RATIO: 15 (ref 10–24)
BUN: 18 mg/dL (ref 8–27)
CHLORIDE: 103 mmol/L (ref 96–106)
CO2: 21 mmol/L (ref 20–29)
Calcium: 9.5 mg/dL (ref 8.6–10.2)
Creatinine, Ser: 1.18 mg/dL (ref 0.76–1.27)
GFR calc Af Amer: 71 mL/min/{1.73_m2} (ref >59)
GFR calc non Af Amer: 61 mL/min/{1.73_m2} (ref >59)
GLUCOSE: 121 mg/dL — AB (ref 65–99)
Potassium: 4.7 mmol/L (ref 3.5–5.2)
SODIUM: 141 mmol/L (ref 134–144)

## 2017-11-19 ENCOUNTER — Telehealth: Payer: Self-pay | Admitting: Physician Assistant

## 2017-11-19 NOTE — Telephone Encounter (Signed)
Called patient and made him aware of procedure time change for tomorrow.  He is a now the 1200PM case and asked to arrive no later then 1000.  He said that would be fine and was agreeable to the change.  Tommye Standard, PA-C

## 2017-11-20 ENCOUNTER — Other Ambulatory Visit: Payer: Self-pay

## 2017-11-20 ENCOUNTER — Ambulatory Visit (HOSPITAL_COMMUNITY)
Admission: RE | Admit: 2017-11-20 | Discharge: 2017-11-21 | Disposition: A | Discharge status: Home / Self Care | Payer: Medicare Other | Source: Ambulatory Visit | Attending: Internal Medicine | Admitting: Internal Medicine

## 2017-11-20 ENCOUNTER — Encounter (HOSPITAL_COMMUNITY): Payer: Self-pay | Admitting: General Practice

## 2017-11-20 ENCOUNTER — Ambulatory Visit (HOSPITAL_COMMUNITY)
Admission: RE | Disposition: A | Discharge status: Home / Self Care | Payer: Self-pay | Source: Ambulatory Visit | Attending: Internal Medicine

## 2017-11-20 DIAGNOSIS — Z951 Presence of aortocoronary bypass graft: Secondary | ICD-10-CM

## 2017-11-20 DIAGNOSIS — R918 Other nonspecific abnormal finding of lung field: Secondary | ICD-10-CM | POA: Diagnosis not present

## 2017-11-20 DIAGNOSIS — Z7984 Long term (current) use of oral hypoglycemic drugs: Secondary | ICD-10-CM | POA: Diagnosis not present

## 2017-11-20 DIAGNOSIS — Z87891 Personal history of nicotine dependence: Secondary | ICD-10-CM | POA: Insufficient documentation

## 2017-11-20 DIAGNOSIS — E119 Type 2 diabetes mellitus without complications: Secondary | ICD-10-CM | POA: Insufficient documentation

## 2017-11-20 DIAGNOSIS — I5022 Chronic systolic (congestive) heart failure: Secondary | ICD-10-CM | POA: Insufficient documentation

## 2017-11-20 DIAGNOSIS — I255 Ischemic cardiomyopathy: Secondary | ICD-10-CM | POA: Diagnosis present

## 2017-11-20 DIAGNOSIS — I480 Paroxysmal atrial fibrillation: Secondary | ICD-10-CM | POA: Insufficient documentation

## 2017-11-20 DIAGNOSIS — Z8249 Family history of ischemic heart disease and other diseases of the circulatory system: Secondary | ICD-10-CM | POA: Insufficient documentation

## 2017-11-20 DIAGNOSIS — Z7902 Long term (current) use of antithrombotics/antiplatelets: Secondary | ICD-10-CM | POA: Insufficient documentation

## 2017-11-20 DIAGNOSIS — R0602 Shortness of breath: Secondary | ICD-10-CM | POA: Insufficient documentation

## 2017-11-20 DIAGNOSIS — I340 Nonrheumatic mitral (valve) insufficiency: Secondary | ICD-10-CM | POA: Insufficient documentation

## 2017-11-20 DIAGNOSIS — Z955 Presence of coronary angioplasty implant and graft: Secondary | ICD-10-CM | POA: Diagnosis not present

## 2017-11-20 DIAGNOSIS — Z8551 Personal history of malignant neoplasm of bladder: Secondary | ICD-10-CM | POA: Diagnosis not present

## 2017-11-20 DIAGNOSIS — I110 Hypertensive heart disease with heart failure: Secondary | ICD-10-CM | POA: Insufficient documentation

## 2017-11-20 DIAGNOSIS — Z9889 Other specified postprocedural states: Secondary | ICD-10-CM | POA: Diagnosis not present

## 2017-11-20 DIAGNOSIS — I2510 Atherosclerotic heart disease of native coronary artery without angina pectoris: Secondary | ICD-10-CM | POA: Diagnosis not present

## 2017-11-20 DIAGNOSIS — Z79899 Other long term (current) drug therapy: Secondary | ICD-10-CM | POA: Diagnosis not present

## 2017-11-20 DIAGNOSIS — Z006 Encounter for examination for normal comparison and control in clinical research program: Secondary | ICD-10-CM | POA: Insufficient documentation

## 2017-11-20 DIAGNOSIS — Z959 Presence of cardiac and vascular implant and graft, unspecified: Secondary | ICD-10-CM

## 2017-11-20 HISTORY — PX: ICD IMPLANT: EP1208

## 2017-11-20 LAB — SURGICAL PCR SCREEN
MRSA, PCR: NEGATIVE
Staphylococcus aureus: NEGATIVE

## 2017-11-20 LAB — GLUCOSE, CAPILLARY: GLUCOSE-CAPILLARY: 93 mg/dL (ref 70–99)

## 2017-11-20 SURGERY — ICD IMPLANT

## 2017-11-20 MED ORDER — FENTANYL CITRATE (PF) 100 MCG/2ML IJ SOLN
INTRAMUSCULAR | Status: AC
  Filled 2017-11-20: qty 2

## 2017-11-20 MED ORDER — CARVEDILOL 3.125 MG PO TABS
3.1250 mg | ORAL_TABLET | Freq: Two times a day (BID) | ORAL | Status: DC
  Administered 2017-11-21: 3.125 mg via ORAL
  Filled 2017-11-20 (×2): qty 1

## 2017-11-20 MED ORDER — MUPIROCIN 2 % EX OINT: 1.0000 "application " | TOPICAL_OINTMENT | Freq: Once | CUTANEOUS | Status: DC

## 2017-11-20 MED ORDER — HEPARIN (PORCINE) IN NACL 1000-0.9 UT/500ML-% IV SOLN
INTRAVENOUS | Status: AC
  Filled 2017-11-20: qty 500

## 2017-11-20 MED ORDER — MIDAZOLAM HCL 5 MG/5ML IJ SOLN
INTRAMUSCULAR | Status: DC | PRN
  Administered 2017-11-20: 1 mg via INTRAVENOUS
  Administered 2017-11-20: 2 mg via INTRAVENOUS
  Administered 2017-11-20: 1 mg via INTRAVENOUS

## 2017-11-20 MED ORDER — SODIUM CHLORIDE 0.9 % IV SOLN
80.0000 mg | INTRAVENOUS | Status: AC
  Administered 2017-11-20: 80 mg

## 2017-11-20 MED ORDER — ACETAMINOPHEN 325 MG PO TABS
325.0000 mg | ORAL_TABLET | ORAL | Status: DC | PRN
  Administered 2017-11-20 – 2017-11-21 (×2): 650 mg via ORAL
  Filled 2017-11-20 (×2): qty 2

## 2017-11-20 MED ORDER — SODIUM CHLORIDE 0.9 % IV SOLN
INTRAVENOUS | Status: AC
  Filled 2017-11-20: qty 2

## 2017-11-20 MED ORDER — METFORMIN HCL ER 500 MG PO TB24
500.0000 mg | ORAL_TABLET | Freq: Two times a day (BID) | ORAL | Status: DC
  Administered 2017-11-21: 500 mg via ORAL
  Filled 2017-11-20 (×2): qty 1

## 2017-11-20 MED ORDER — SODIUM CHLORIDE 0.9 % IV SOLN
INTRAVENOUS | Status: DC
  Administered 2017-11-20: 12:00:00 via INTRAVENOUS

## 2017-11-20 MED ORDER — FENTANYL CITRATE (PF) 100 MCG/2ML IJ SOLN
INTRAMUSCULAR | Status: DC | PRN
  Administered 2017-11-20: 50 ug via INTRAVENOUS
  Administered 2017-11-20 (×2): 25 ug via INTRAVENOUS

## 2017-11-20 MED ORDER — MUPIROCIN 2 % EX OINT
TOPICAL_OINTMENT | CUTANEOUS | Status: AC
  Administered 2017-11-20: 12:00:00
  Filled 2017-11-20: qty 22

## 2017-11-20 MED ORDER — LIDOCAINE HCL (PF) 1 % IJ SOLN
INTRAMUSCULAR | Status: DC | PRN
  Administered 2017-11-20: 50 mL

## 2017-11-20 MED ORDER — HEPARIN (PORCINE) IN NACL 2-0.9 UNITS/ML
INTRAMUSCULAR | Status: AC | PRN
  Administered 2017-11-20: 500 mL

## 2017-11-20 MED ORDER — CEFAZOLIN SODIUM-DEXTROSE 2-4 GM/100ML-% IV SOLN
2.0000 g | INTRAVENOUS | Status: AC
  Administered 2017-11-20: 2 g via INTRAVENOUS
  Filled 2017-11-20: qty 100

## 2017-11-20 MED ORDER — CEFAZOLIN SODIUM-DEXTROSE 1-4 GM/50ML-% IV SOLN
1.0000 g | Freq: Four times a day (QID) | INTRAVENOUS | Status: AC
  Administered 2017-11-20 – 2017-11-21 (×3): 1 g via INTRAVENOUS
  Filled 2017-11-20 (×3): qty 50

## 2017-11-20 MED ORDER — LOSARTAN POTASSIUM 25 MG PO TABS
25.0000 mg | ORAL_TABLET | Freq: Two times a day (BID) | ORAL | Status: DC
  Administered 2017-11-21: 25 mg via ORAL
  Filled 2017-11-20 (×2): qty 1

## 2017-11-20 MED ORDER — CHLORHEXIDINE GLUCONATE 4 % EX LIQD: 60.0000 mL | Freq: Once | CUTANEOUS | Status: DC

## 2017-11-20 MED ORDER — APIXABAN 5 MG PO TABS
5.0000 mg | ORAL_TABLET | Freq: Two times a day (BID) | ORAL | Status: DC
  Administered 2017-11-20: 5 mg via ORAL
  Filled 2017-11-20: qty 1

## 2017-11-20 MED ORDER — ATORVASTATIN CALCIUM 80 MG PO TABS
80.0000 mg | ORAL_TABLET | Freq: Every day | ORAL | Status: DC
  Filled 2017-11-20: qty 1

## 2017-11-20 MED ORDER — ASPIRIN EC 81 MG PO TBEC
81.0000 mg | DELAYED_RELEASE_TABLET | Freq: Every day | ORAL | Status: DC
  Administered 2017-11-21: 81 mg via ORAL
  Filled 2017-11-20: qty 1

## 2017-11-20 MED ORDER — LIDOCAINE HCL 1 % IJ SOLN
INTRAMUSCULAR | Status: AC
  Filled 2017-11-20: qty 60

## 2017-11-20 MED ORDER — MIDAZOLAM HCL 5 MG/5ML IJ SOLN
INTRAMUSCULAR | Status: AC
  Filled 2017-11-20: qty 5

## 2017-11-20 MED ORDER — ONDANSETRON HCL 4 MG/2ML IJ SOLN: 4.0000 mg | Freq: Four times a day (QID) | INTRAMUSCULAR | Status: DC | PRN

## 2017-11-20 MED ORDER — SODIUM CHLORIDE 0.9 % IV SOLN
INTRAVENOUS | Status: AC
  Administered 2017-11-20: 16:00:00 via INTRAVENOUS

## 2017-11-20 MED ORDER — CEFAZOLIN SODIUM-DEXTROSE 2-4 GM/100ML-% IV SOLN
INTRAVENOUS | Status: AC
  Filled 2017-11-20: qty 100

## 2017-11-20 SURGICAL SUPPLY — 7 items
CABLE SURGICAL S-101-97-12 (CABLE) ×3 IMPLANT
ICD VISIA MRI VR DVFB1D4 (ICD Generator) IMPLANT
LEAD SPRINT QUAT SEC 6935M-62 (Lead) ×2 IMPLANT
PAD DEFIB LIFELINK (PAD) ×2 IMPLANT
SHEATH CLASSIC 9F (SHEATH) ×2 IMPLANT
TRAY PACEMAKER INSERTION (PACKS) ×3 IMPLANT
VISIA MRI VR DVFB1D4 (ICD Generator) ×3 IMPLANT

## 2017-11-20 NOTE — Interval H&P Note (Signed)
ICD Criteria  Current LVEF:30%. Within 12 months prior to implant: Yes   Heart failure history: Yes, Class II  Cardiomyopathy history: Yes, Ischemic Cardiomyopathy - Prior MI.  Atrial Fibrillation/Atrial Flutter: No.  Ventricular tachycardia history: No.  Cardiac arrest history: No.  History of syndromes with risk of sudden death: No.  Previous ICD: No.  Current ICD indication: Primary  PPM indication: No.   Class I or II Bradycardia indication present: No  Beta Blocker therapy for 3 or more months: Yes, prescribed.   Ace Inhibitor/ARB therapy for 3 or more months: Yes, prescribed.   History and Physical Interval Note:  11/20/2017 1:48 PM  [name] [name]  has presented today for surgery, with the diagnosis of CARDIOMYOPATHY  The various methods of treatment have been discussed with the patient and family. After consideration of risks, benefits and other options for treatment, the patient has consented to  Procedure(s): ICD IMPLANT (N/A) as a surgical intervention .  The patient's history has been reviewed, patient examined, no change in status, stable for surgery.  I have reviewed the patient's chart and labs.  Questions were answered to the patient's satisfaction.     [name] [name]

## 2017-11-20 NOTE — Interval H&P Note (Signed)
History and Physical Interval Note:  11/20/2017 11:13 AM  [name] [name]  has presented today for surgery, with the diagnosis of CARDIOMYOPATHY  The various methods of treatment have been discussed with the patient and family. After consideration of risks, benefits and other options for treatment, the patient has consented to  Procedure(s): ICD IMPLANT (N/A) as a surgical intervention .  The patient's history has been reviewed, patient examined, no change in status, stable for surgery.  I have reviewed the patient's chart and labs.  Questions were answered to the patient's satisfaction.     Virl Axe

## 2017-11-20 NOTE — Discharge Instructions (Signed)
° ° °  Supplemental Discharge Instructions for  Pacemaker/Defibrillator Patients  Activity No heavy lifting or vigorous activity with your left/right arm for 6 to 8 weeks.  Do not raise your left/right arm above your head for one week.  Gradually raise your affected arm as drawn below.             11/24/17                     11/25/17                    11/26/17                   11/27/17 __  NO DRIVING for  1 week   ; you may begin driving on   09/14/98  .  WOUND CARE - Keep the wound area clean and dry.  Do not get this area wet for one week. No showers for one week; you may shower on 11/27/17. - The tape/steri-strips on your wound will fall off; do not pull them off.  No bandage is needed on the site.  DO  NOT apply any creams, oils, or ointments to the wound area. - If you notice any drainage or discharge from the wound, any swelling or bruising at the site, or you develop a fever > 101? F after you are discharged home, call the office at once.  Special Instructions - You are still able to use cellular telephones; use the ear opposite the side where you have your pacemaker/defibrillator.  Avoid carrying your cellular phone near your device. - When traveling through airports, show security personnel your identification card to avoid being screened in the metal detectors.  Ask the security personnel to use the hand wand. - Avoid arc welding equipment, MRI testing (magnetic resonance imaging), TENS units (transcutaneous nerve stimulators).  Call the office for questions about other devices. - Avoid electrical appliances that are in poor condition or are not properly grounded. - Microwave ovens are safe to be near or to operate.  Additional information for defibrillator patients should your device go off: - If your device goes off ONCE and you feel fine afterward, notify the device clinic nurses. - If your device goes off ONCE and you do not feel well afterward, call 911. - If your device goes  off TWICE, call 911. - If your device goes off THREE times in one day, call 911.  DO NOT DRIVE YOURSELF OR A FAMILY MEMBER WITH A DEFIBRILLATOR TO THE HOSPITAL--CALL 911.

## 2017-11-21 ENCOUNTER — Ambulatory Visit (HOSPITAL_COMMUNITY): Payer: Medicare Other

## 2017-11-21 ENCOUNTER — Encounter (HOSPITAL_COMMUNITY): Payer: Self-pay | Admitting: Internal Medicine

## 2017-11-21 DIAGNOSIS — I110 Hypertensive heart disease with heart failure: Secondary | ICD-10-CM | POA: Diagnosis not present

## 2017-11-21 DIAGNOSIS — R0602 Shortness of breath: Secondary | ICD-10-CM | POA: Diagnosis not present

## 2017-11-21 DIAGNOSIS — Z006 Encounter for examination for normal comparison and control in clinical research program: Secondary | ICD-10-CM | POA: Diagnosis not present

## 2017-11-21 DIAGNOSIS — I255 Ischemic cardiomyopathy: Secondary | ICD-10-CM

## 2017-11-21 MED ORDER — APIXABAN 5 MG PO TABS: 5.0000 mg | ORAL_TABLET | Freq: Two times a day (BID) | ORAL | 3 refills | Status: DC

## 2017-11-21 MED FILL — Lidocaine HCl Local Inj 1%: INTRAMUSCULAR | Qty: 60 | Status: AC

## 2017-11-21 NOTE — Plan of Care (Signed)

## 2017-11-21 NOTE — Discharge Summary (Signed)
ELECTROPHYSIOLOGY PROCEDURE DISCHARGE SUMMARY    Patient ID: [name] [name],  MRN: [id number], DOB/AGE: 12/21/1945 72 y.o.  Admit date: 11/20/2017 Discharge date: 11/21/2017  Primary Care Physician: Christain Sacramento, MD Primary Cardiologist: [place] Electrophysiologist: Caryl Comes  Primary Discharge Diagnosis:  ICM s/p ICD this admission  Secondary Discharge Diagnosis:  1.  CAD 2.  Chronic systolic heart failure 3.  Diabetes 4.  HTN  No Known Allergies   Procedures This Admission:  1.  Implantation of a MDT single chamber ICD on 11/20/17 by Dr Caryl Comes.  The patient received a MDT model number Visia AF ICD with model number 1443 right ventricular lead..  There were no immediate post procedure complications. 2.  CXR on 11/21/17 demonstrated no pneumothorax status post device implantation.   Brief HPI: [name] [name] is a 72 y.o. male was referred to electrophysiology in the outpatient setting for consideration of ICD implantation.  Past medical history includes ICM, CAD, LV thrombus.  The patient has persistent LV dysfunction despite guideline directed therapy.  Risks, benefits, and alternatives to ICD implantation were reviewed with the patient who wished to proceed.   Hospital Course:  The patient was admitted and underwent implantation of a MDT single chamber ICD with details as outlined above. He was monitored on telemetry overnight which demonstrated sinus rhythm.  Left chest was without hematoma or ecchymosis.  The device was interrogated and found to be functioning normally.  CXR was obtained and demonstrated no pneumothorax status post device implantation.  Wound care, arm mobility, and restrictions were reviewed with the patient.  The patient was examined and considered stable for discharge to home.   The patient's discharge medications include an ACE-I (Losrartan) and beta blocker (Coreg).   Physical Exam: Vitals:   11/20/17 1624 11/20/17 1654 11/20/17 2006 11/21/17 0500   BP: 137/65 (!) 160/111 140/66 138/71  Pulse: 72 69 62 (!) 57  Resp: 17 (!) 25 (!) 22 (!) 22  Temp:   97.6 F (36.4 C) 97.6 F (36.4 C)  TempSrc:   Oral Oral  SpO2: 97% 97% 99% 98%  Weight:    182 lb 6.4 oz (82.7 kg)  Height:        GEN- The patient is well appearing, alert and oriented x 3 today.   HEENT: normocephalic, atraumatic; sclera clear, conjunctiva pink; hearing intact; oropharynx clear; neck supple  Lungs- Clear to ausculation bilaterally, normal work of breathing.  No wheezes, rales, rhonchi Heart- Regular rate and rhythm  GI- soft, non-tender, non-distended, bowel sounds present  Extremities- no clubbing, cyanosis, or edema  MS- no significant deformity or atrophy Skin- warm and dry, no rash or lesion, left chest without hematoma/ecchymosis Psych- euthymic mood, full affect Neuro- strength and sensation are intact   Labs:   Lab Results  Component Value Date   WBC 7.1 11/14/2017   HGB 12.5 (L) 11/14/2017   HCT 38.3 11/14/2017   MCV 89 11/14/2017   PLT 213 11/14/2017    Recent Labs  Lab 11/14/17 1025  NA 141  K 4.7  CL 103  CO2 21  BUN 18  CREATININE 1.18  CALCIUM 9.5  GLUCOSE 121*    Discharge Medications:  Allergies as of 11/21/2017   No Known Allergies     Medication List    TAKE these medications   apixaban 5 MG Tabs tablet Commonly known as:  ELIQUIS Take 1 tablet (5 mg total) by mouth 2 (two) times daily. Resume 11/25/17 What changed:  additional  instructions   aspirin EC 81 MG tablet Take 81 mg by mouth daily.   atorvastatin 80 MG tablet Commonly known as:  LIPITOR Take 1 tablet (80 mg total) by mouth at bedtime.   carvedilol 3.125 MG tablet Commonly known as:  COREG Take 1 tablet (3.125 mg total) by mouth 2 (two) times daily.   eplerenone 25 MG tablet Commonly known as:  INSPRA Take 25 mg by mouth daily.   losartan 25 MG tablet Commonly known as:  COZAAR Take 1 tablet (25 mg total) by mouth 2 (two) times daily.     metFORMIN 500 MG 24 hr tablet Commonly known as:  GLUCOPHAGE-XR Take 500 mg by mouth 2 (two) times daily.   mometasone 50 MCG/ACT nasal spray Commonly known as:  NASONEX Place 2 sprays into the nose daily.       Disposition:  Discharge Instructions    Diet - low sodium heart healthy   Complete by:  As directed    Increase activity slowly   Complete by:  As directed      Follow-up Information    [place] [facility] Follow up on 12/02/2017.   Specialty:  Cardiology Why:  2:00PM, wound check visit Contact information: [address], [address]       Bensimhon, Shaune Pascal, MD Follow up on 12/04/2017.   Specialty:  Cardiology Why:  2:20PM Contact information: [address] [place] [address] [phone]        Deboraha Sprang, MD Follow up on 02/19/2018.   Specialty:  Cardiology Why:  2:00PM Contact information: [address] [address] [phone]           Duration of Discharge Encounter: Greater than 30 minutes including physician time.  Signed, Chanetta Marshall, NP 11/21/2017 8:02 AM

## 2017-11-26 ENCOUNTER — Other Ambulatory Visit (HOSPITAL_COMMUNITY): Payer: Self-pay | Admitting: Internal Medicine

## 2017-12-02 ENCOUNTER — Ambulatory Visit (INDEPENDENT_AMBULATORY_CARE_PROVIDER_SITE_OTHER): Payer: Medicare Other | Admitting: *Deleted

## 2017-12-02 DIAGNOSIS — I5042 Chronic combined systolic (congestive) and diastolic (congestive) heart failure: Secondary | ICD-10-CM | POA: Diagnosis not present

## 2017-12-03 LAB — CUP PACEART INCLINIC DEVICE CHECK
Battery Voltage: 3.06 V
Date Time Interrogation Session: 20190805182250
HighPow Impedance: 58 Ohm
Implantable Lead Implant Date: 20190724
Implantable Lead Location: 753860
Implantable Pulse Generator Implant Date: 20190724
Lead Channel Pacing Threshold Amplitude: 0.5 V
Lead Channel Pacing Threshold Pulse Width: 0.4 ms
Lead Channel Sensing Intrinsic Amplitude: 10 mV
Lead Channel Sensing Intrinsic Amplitude: 9.25 mV
Lead Channel Setting Pacing Amplitude: 3.5 V
Lead Channel Setting Sensing Sensitivity: 0.3 mV
MDC IDC MSMT BATTERY REMAINING LONGEVITY: 133 mo
MDC IDC MSMT LEADCHNL RV IMPEDANCE VALUE: 342 Ohm
MDC IDC MSMT LEADCHNL RV IMPEDANCE VALUE: 399 Ohm
MDC IDC SET LEADCHNL RV PACING PULSEWIDTH: 0.4 ms
MDC IDC STAT BRADY RV PERCENT PACED: 0.13 %

## 2017-12-03 NOTE — Progress Notes (Signed)
Wound check appointment. Dermabond removed. Wound without redness or edema. Incision edges approximated, wound well healed. Normal device function. Threshold, sensing, and impedances consistent with implant measurements. Device programmed at 3.5V for extra safety margin until 3 month visit. Histogram distribution appropriate for patient and level of activity. No AF episodes or ventricular arrhythmias noted. Patient educated about wound care, arm mobility, lifting restrictions, shock plan. ROV in 3 months with SK.

## 2017-12-04 ENCOUNTER — Encounter (HOSPITAL_COMMUNITY): Payer: Self-pay | Admitting: Internal Medicine

## 2017-12-04 ENCOUNTER — Other Ambulatory Visit: Payer: Self-pay

## 2017-12-04 ENCOUNTER — Ambulatory Visit (HOSPITAL_COMMUNITY)
Admission: RE | Admit: 2017-12-04 | Discharge: 2017-12-04 | Disposition: A | Discharge status: Home / Self Care | Payer: Medicare Other | Source: Ambulatory Visit | Attending: Internal Medicine | Admitting: Internal Medicine

## 2017-12-04 VITALS — BP 119/70 | HR 58 | Wt 188.1 lb

## 2017-12-04 DIAGNOSIS — I255 Ischemic cardiomyopathy: Secondary | ICD-10-CM | POA: Diagnosis not present

## 2017-12-04 DIAGNOSIS — Z7982 Long term (current) use of aspirin: Secondary | ICD-10-CM | POA: Insufficient documentation

## 2017-12-04 DIAGNOSIS — Z86718 Personal history of other venous thrombosis and embolism: Secondary | ICD-10-CM | POA: Insufficient documentation

## 2017-12-04 DIAGNOSIS — I110 Hypertensive heart disease with heart failure: Secondary | ICD-10-CM | POA: Diagnosis not present

## 2017-12-04 DIAGNOSIS — I5022 Chronic systolic (congestive) heart failure: Secondary | ICD-10-CM | POA: Diagnosis present

## 2017-12-04 DIAGNOSIS — Z8249 Family history of ischemic heart disease and other diseases of the circulatory system: Secondary | ICD-10-CM | POA: Diagnosis not present

## 2017-12-04 DIAGNOSIS — Z87891 Personal history of nicotine dependence: Secondary | ICD-10-CM | POA: Insufficient documentation

## 2017-12-04 DIAGNOSIS — E119 Type 2 diabetes mellitus without complications: Secondary | ICD-10-CM | POA: Diagnosis not present

## 2017-12-04 DIAGNOSIS — I252 Old myocardial infarction: Secondary | ICD-10-CM | POA: Diagnosis not present

## 2017-12-04 DIAGNOSIS — Z7901 Long term (current) use of anticoagulants: Secondary | ICD-10-CM | POA: Insufficient documentation

## 2017-12-04 DIAGNOSIS — Z8551 Personal history of malignant neoplasm of bladder: Secondary | ICD-10-CM | POA: Insufficient documentation

## 2017-12-04 DIAGNOSIS — I2510 Atherosclerotic heart disease of native coronary artery without angina pectoris: Secondary | ICD-10-CM | POA: Diagnosis not present

## 2017-12-04 DIAGNOSIS — H40009 Preglaucoma, unspecified, unspecified eye: Secondary | ICD-10-CM | POA: Insufficient documentation

## 2017-12-04 DIAGNOSIS — Z95810 Presence of automatic (implantable) cardiac defibrillator: Secondary | ICD-10-CM | POA: Insufficient documentation

## 2017-12-04 DIAGNOSIS — I340 Nonrheumatic mitral (valve) insufficiency: Secondary | ICD-10-CM | POA: Diagnosis not present

## 2017-12-04 DIAGNOSIS — I5042 Chronic combined systolic (congestive) and diastolic (congestive) heart failure: Secondary | ICD-10-CM

## 2017-12-04 DIAGNOSIS — Z7984 Long term (current) use of oral hypoglycemic drugs: Secondary | ICD-10-CM | POA: Insufficient documentation

## 2017-12-04 DIAGNOSIS — Z79899 Other long term (current) drug therapy: Secondary | ICD-10-CM | POA: Diagnosis not present

## 2017-12-04 DIAGNOSIS — Z951 Presence of aortocoronary bypass graft: Secondary | ICD-10-CM | POA: Insufficient documentation

## 2017-12-04 DIAGNOSIS — Z888 Allergy status to other drugs, medicaments and biological substances status: Secondary | ICD-10-CM | POA: Insufficient documentation

## 2017-12-04 DIAGNOSIS — I480 Paroxysmal atrial fibrillation: Secondary | ICD-10-CM | POA: Diagnosis not present

## 2017-12-04 MED ORDER — SACUBITRIL-VALSARTAN 24-26 MG PO TABS: 1.0000 | ORAL_TABLET | Freq: Two times a day (BID) | ORAL | 6 refills | Status: DC

## 2017-12-04 NOTE — Progress Notes (Signed)
Advanced Heart Failure Clinic Note   Primary Cardiologist: Dr. Haroldine Laws   HPI:  [name] [name] is a 72 y.o. 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   Pt admitted in 12/18 with anterior STEMI and developed cardiogenic shock requiring Impella support. Urgent cath showed severe 3v CAD as below. While on impella, pt developed sepsis with WBC ~ 40K, UCx + pseudomonas. Treated for AECOPD and completed ABX therapy. BCx negative. Stabilized and underwent CABG x 2 with LIMA to LAD and SVG to OM on 04/08/2017. Pt transiently required milrinone but weaned prior to discharge. Required amiodarone for brief post op afib, but had no recurrence.   Echo 08/07/17 EF 35-40% laminated apical clot. (Dr.Bensimhon felt ~ 25-30%) and moderate to severe MR.   Cardiac MRI 09/2017 EF 30%, laminated apical thrombus, normal RV size and function, LGE pattern suggests infarction in LAD territory, unlikely to improve with revascularization  S/p Medtronic ICD with Dr Caryl Comes 11/20/17.  He presents today for regular follow up. Last visit switched from spiro to eplerenone with painful gynecomastia. Overall doing okay. He is SOB when he gets up in the morning. SOB gets better throughout the day. He has finished CR. Now going to the [facility] 3x/week, using stationary bikes and weight machines. Can't walk on T< due to hip pain. He has orthopnea at baseline. No edema. No CP, dizziness, or palpitations. Breast pain improving, but still there.[name] Appetite is great and he is eating a lot. Rarely weighs. Weight is up 8 lbs on our scale in 2 months.Taking all medications. Does not limit fluid or salt.    ICD (Interrogated persoanlly): No VT/VF. No AF/AT. Thoracic impedence trending down, but may be too soon to trend. Personally reviewed.   Review of systems complete and  found to be negative unless listed in HPI.   [facility] 03/31/2017  Prox RCA lesion is 100% stenosed. Dist RCA lesion is 100% stenosed (retrograde filling from collaterals does not go beyond the distal vessel) -->faint collateral filling to the distal PL and PDA from septal perforators and a diffusely diseased AV groove circumflex  LAV Groove lesion is 90% stenosed.  Prox LAD-1 lesion is 95% stenosed. Prox LAD-2 lesion is 85% stenosed. Heavily calcified, tandem lesions in very tortuous segment of the vessel.  Prox Cx to Mid Cx lesion is 70% stenosed. Heavily calcified  There is severe left ventricular systolic dysfunction. The left ventricular ejection fraction is less than 25% by visual estimate.  LV end diastolic pressure is severely elevated -prior to Impella insertion  Hemodynamic findings consistent with moderate pulmonary hypertension.  Successful Impella insertion with 3.5-3.6 LPM flow's  Echo 04/01/2017 EF 15-20%  Echo 04/16/17 (post op) Improved to 25-30% on 04/16/17  Past Medical History:  Diagnosis Date  . Bladder cancer ([place])   . Borderline glaucoma   . CHF (congestive heart failure) ([place])   . Coronary artery disease   . Diabetes mellitus type 2, controlled ([place]) ORAL MED  . Essential hypertension     Current Outpatient Medications  Medication Sig Dispense Refill  . apixaban (ELIQUIS) 5 MG TABS tablet Take 1 tablet (5 mg total) by mouth 2 (two) times daily. Resume 11/25/17 180 tablet 3  . aspirin EC 81 MG tablet Take 81 mg by mouth daily.    [name] atorvastatin (LIPITOR) 80  MG tablet Take 1 tablet (80 mg total) by mouth at bedtime. 30 tablet 1  . carvedilol (COREG) 3.125 MG tablet TAKE 1 TABLET (3.125 MG TOTAL) BY MOUTH 2 (TWO) TIMES DAILY. 60 tablet 5  . eplerenone (INSPRA) 25 MG tablet Take 25 mg by mouth daily.    [name] losartan (COZAAR) 25 MG tablet Take 1 tablet (25 mg total) by mouth 2 (two) times daily. 60 tablet 5  . metFORMIN (GLUCOPHAGE-XR) 500 MG 24 hr tablet Take 500  mg by mouth 2 (two) times daily.    . mometasone (NASONEX) 50 MCG/ACT nasal spray Place 2 sprays into the nose daily.      No current facility-administered medications for this encounter.     Allergies  Allergen Reactions  . Spironolactone Other (See Comments)    Painful gynecomastia      Social History   Socioeconomic History  . Marital status: Married    Spouse name: Not on file  . Number of children: Not on file  . Years of education: Not on file  . Highest education level: Not on file  Occupational History  . Not on file  Social Needs  . Financial resource strain: Not on file  . Food insecurity:    Worry: Not on file    Inability: Not on file  . Transportation needs:    Medical: Not on file    Non-medical: Not on file  Tobacco Use  . Smoking status: Former Smoker    Packs/day: 1.00    Years: 40.00    Pack years: 40.00    Types: Cigarettes    Last attempt to quit: 03/31/2017    Years since quitting: 0.6  . Smokeless tobacco: Never Used  Substance and Sexual Activity  . Alcohol use: No  . Drug use: No  . Sexual activity: Not on file  Lifestyle  . Physical activity:    Days per week: 0 days    Minutes per session: 0 min  . Stress: Only a little  Relationships  . Social connections:    Talks on phone: Not on file    Gets together: Not on file    Attends religious service: Not on file    Active member of club or organization: Not on file    Attends meetings of clubs or organizations: Not on file    Relationship status: Not on file  . Intimate partner violence:    Fear of current or ex partner: Not on file    Emotionally abused: Not on file    Physically abused: Not on file    Forced sexual activity: Not on file  Other Topics Concern  . Not on file  Social History Narrative  . Not on file   Family History  Problem Relation Age of Onset  . Heart attack Sister   . Heart disease Sister     Vitals:   12/04/17 1415  BP: 119/70  Pulse: (!) 58  SpO2:  99%  Weight: 188 lb 2 oz (85.3 kg)   Wt Readings from Last 3 Encounters:  12/04/17 188 lb 2 oz (85.3 kg)  11/21/17 182 lb 6.4 oz (82.7 kg)  11/04/17 185 lb (83.9 kg)    PHYSICAL EXAM: General: Well appearing. No resp difficulty. HEENT: Normal anicteric Neck: Supple. JVP 6-7. Carotids 2+ bilat; no bruits. No thyromegaly or nodule noted. Cor: PMI laterally displaced. RRR, No M/G/R noted. Left chest incision CDI Lungs: CTAB, normal effort. Mildly decreased throughout Abdomen: Soft, non-tender, non-distended, no  HSM. No bruits or masses. +BS  Extremities: no cyanosis, clubbing, rash, edema Neuro: alert & oriented x 3, cranial nerves grossly intact. moves all 4 extremities w/o difficulty. Affect pleasant   ASSESSMENT & PLAN:  1. Chronic systolic CHF due to ICM  - EF 04/01/17 EF 15-20% -> Improved to 25-30% on 04/16/17 Post op CABG as below.  - Echo 08/07/17 read as EF 35-40% but I reviewed personally and EF 25-30% (reviewed with Dr. Meda Coffee who agrees) + laminated apical clot and moderate to severe MR. RV ok. Will check cMRI to confirm and refer for ICD if EF <= 35% - Cardiac MRI 09/2017 EF 30%, laminated apical thrombus, normal RV size and function, LGE pattern suggests infarction in LAD territory, unlikely to improve with revascularization - S/p Medtronic ICD with Dr Caryl Comes 11/20/17 - Stable NYHA II.   - Volume status only mildly elevated despite 8 lb weight gain in 2 months. Does not need lasix.  - Continue carvedilol 3.125 bid. HR too low to increase - Continue eplerenone 25 mg daily. (painful gynecomastia with spiro) - Stop losartan. Start Entresto 24/26 mg BID.  - Discussed limiting fluid and salt intake.  2. Mitral regurgitation - moderate to severe on echo 07/2017 - likely functional - will need to follow closely. - if not improving with therapy may need to consider MitraClip  3. CAD - s/p CABG with LIMA to LAD and SVG to OM on 04/08/2017 - No s/s ischemia - Continue statin,  BB. No ASA with Eliquis use. - He has finished cardiac rehab. He is not sure if his insurance pays for maintenance program. He is now working out at [employer]  4. Tobacco abuse: Smoked 1 ppd PTA for > 50 years.  - Remains quit  5. Hx of bladder CA:  - s/p Multiple rounds of BCG therapy. Stable  6. Paroxysmal Atrial fibrillation - Regular on exam. No Afib on ICD interrogation. Off amio - Continue Eliquis. Denies bleeding.   7. LV clot - Laminated - On Eliquis for PAF. No change.  8. DM2 - Consider Jardiance at next visit. Will not start today with initiation of Entresto  DC aspirin Stop losartan Start Entresto 24/26 mg BID  Georgiana Shore, NP 12/04/17   Patient seen and examined with the above-signed Advanced Practice Provider and/or Housestaff. I personally reviewed laboratory data, imaging studies and relevant notes. I independently examined the patient and formulated the important aspects of the plan. I have edited the note to reflect any of my changes or salient points. I have personally discussed the plan with the patient and/or family.  Overall doing well. NYHA II. Volume status stable despite weight gain. cMRI reviewed. EF 30% with extensive anterior scar. Now s/p ICD. ICD interrogated personally and no VT/VF/AF. Will stop ASA with Elqiuis. Switch losartan to Entresto as BP tolerates. Continue exercise.   Glori Bickers, MD  10:25 PM

## 2017-12-04 NOTE — Patient Instructions (Signed)
Stop Aspirin  Stop Losartan  Start Entresto 24/26 mg Twice daily   Your physician recommends that you schedule a follow-up appointment in: 3-4 months

## 2017-12-06 ENCOUNTER — Telehealth (HOSPITAL_COMMUNITY): Payer: Self-pay | Admitting: Pharmacist

## 2017-12-06 NOTE — Telephone Encounter (Signed)
Entresto PA approved by [employer].   Ruta Hinds. Velva Harman, PharmD, BCPS, CPP Clinical Pharmacist Phone: [phone] 12/06/2017 12:28 PM

## 2018-01-10 ENCOUNTER — Other Ambulatory Visit (HOSPITAL_COMMUNITY): Payer: Self-pay | Admitting: Pharmacist

## 2018-01-10 MED ORDER — SACUBITRIL-VALSARTAN 24-26 MG PO TABS: 1.0000 | ORAL_TABLET | Freq: Two times a day (BID) | ORAL | 3 refills | Status: DC

## 2018-02-19 ENCOUNTER — Encounter: Payer: Self-pay | Admitting: Internal Medicine

## 2018-02-19 ENCOUNTER — Ambulatory Visit (INDEPENDENT_AMBULATORY_CARE_PROVIDER_SITE_OTHER): Payer: Medicare Other | Admitting: Internal Medicine

## 2018-02-19 VITALS — BP 118/72 | HR 61 | Ht 74.0 in | Wt 197.0 lb

## 2018-02-19 DIAGNOSIS — I5042 Chronic combined systolic (congestive) and diastolic (congestive) heart failure: Secondary | ICD-10-CM

## 2018-02-19 DIAGNOSIS — I236 Thrombosis of atrium, auricular appendage, and ventricle as current complications following acute myocardial infarction: Secondary | ICD-10-CM

## 2018-02-19 DIAGNOSIS — Z951 Presence of aortocoronary bypass graft: Secondary | ICD-10-CM

## 2018-02-19 DIAGNOSIS — I255 Ischemic cardiomyopathy: Secondary | ICD-10-CM

## 2018-02-19 DIAGNOSIS — Z95810 Presence of automatic (implantable) cardiac defibrillator: Secondary | ICD-10-CM

## 2018-02-19 DIAGNOSIS — I480 Paroxysmal atrial fibrillation: Secondary | ICD-10-CM | POA: Diagnosis not present

## 2018-02-19 NOTE — Progress Notes (Signed)
Patient Care Team: Christain Sacramento, MD as PCP - General (Family Medicine)   HPI  [name] [name] is a 72 y.o. male seen in followup for ICD implanted always for primary preve site ntion. Ischemic cardiomyopathy having presented 12/18 with anterior MI and shock requiring impella support.  Further complicated by sepsis.  S/P CABG with postop afib     He was found to have a laminated LV thrombus.  He has been treated with apixaban.  The patient denies chest pain, shortness of breath, nocturnal dyspnea, orthopnea or peripheral edema.  There have been no palpitations, lightheadedness or syncope.   DATE TEST EF   12/18 LHC  25 % RCA T; LAD 95; Cx 70%  4/19 Echo  25-30% Mod-sev MR  6/19 cMRI 30%    Date Cr K Hgb  7/19 1.18 4.7 12.5            Records and Results Reviewed   Past Medical History:  Diagnosis Date  . Bladder cancer ([place])   . Borderline glaucoma   . CHF (congestive heart failure) ([place])   . Coronary artery disease   . Diabetes mellitus type 2, controlled ([place]) ORAL MED  . Essential hypertension     Past Surgical History:  Procedure Laterality Date  . CAROTID DUPLEX SCAN  07-26-2010   BILATERAL ICA  STENOSIS 1% - 39%  . CORONARY ARTERY BYPASS GRAFT N/A 04/08/2017   Procedure: CORONARY ARTERY BYPASS GRAFTING (CABG) TIMES TWO USING LEFT INTERNAL MAMMARY ARTERY AND LEFT SAPHENOUS LEG VEIN HARVESTED ENDOSCOPICALLY.  LEG VEIN ALSO HARVESTED FROM THE RIGHT LEG;  Surgeon: [name], MD;  Location: [facility];  Service: Open Heart Surgery;  Laterality: N/A;  . CYSTOSCOPY WITH BIOPSY N/A 08/01/2012   Procedure: CYSTOSCOPY WITH BIOPSY BLADDER BIOPSY   ;  Surgeon: Fredricka Bonine, MD;  Location[facility];  Service: Urology;  Laterality: N/A;  . FULGURATION OF BLADDER TUMOR N/A 08/01/2012   Procedure: FULGURATION OF BLADDER TUMOR;  Surgeon: Fredricka Bonine, MD;  Location: [facility];  Service: Urology;  Laterality: N/A;   . ICD IMPLANT  11/20/2017  . ICD IMPLANT N/A 11/20/2017   Procedure: ICD IMPLANT;  Surgeon: Deboraha Sprang, MD;  Location: [place] [facility];  Service: Cardiovascular;  Laterality: N/A;  . LEFT HEART CATH AND CORONARY ANGIOGRAPHY N/A 03/31/2017   Procedure: LEFT HEART CATH AND CORONARY ANGIOGRAPHY;  Surgeon: Leonie Man, MD;  Location: [place] [facility];  Service: Cardiovascular;  Laterality: N/A;  . RIGHT HEART CATH Right 03/31/2017   Procedure: RIGHT HEART CATH;  Surgeon: Leonie Man, MD;  Location: [place] [facility];  Service: Cardiovascular;  Laterality: Right;  . TEE WITHOUT CARDIOVERSION N/A 04/08/2017   Procedure: TRANSESOPHAGEAL ECHOCARDIOGRAM (TEE);  Surgeon: [name], MD;  Location: [facility];  Service: Open Heart Surgery;  Laterality: N/A;  . TRANSURETHRAL RESECTION OF BLADDER TUMOR  05/29/2011   Procedure: TRANSURETHRAL RESECTION OF BLADDER TUMOR (TURBT);  Surgeon: Fredricka Bonine, MD;  Location: [facility];  Service: Urology;  Laterality: N/A;  . VENTRICULAR ASSIST DEVICE INSERTION Right 03/31/2017   Procedure: VENTRICULAR ASSIST DEVICE INSERTION;  Surgeon: Leonie Man, MD;  Location: [place] [facility];  Service: Cardiovascular;  Laterality: Right;    Current Meds  Medication Sig  . apixaban (ELIQUIS) 5 MG TABS tablet Take 1 tablet (5 mg total) by mouth 2 (two) times daily. Resume 11/25/17  .  atorvastatin (LIPITOR) 80 MG tablet Take 1 tablet (80 mg total) by mouth at bedtime.  . carvedilol (COREG) 3.125 MG tablet TAKE 1 TABLET (3.125 MG TOTAL) BY MOUTH 2 (TWO) TIMES DAILY.  [name] eplerenone (INSPRA) 25 MG tablet Take 25 mg by mouth daily.  . metFORMIN (GLUCOPHAGE-XR) 500 MG 24 hr tablet Take 500 mg by mouth 2 (two) times daily.  . mometasone (NASONEX) 50 MCG/ACT nasal spray Place 2 sprays into the nose daily.   . sacubitril-valsartan (ENTRESTO) 24-26 MG Take 1 tablet by mouth 2 (two) times daily.    Allergies  Allergen Reactions  .  Spironolactone Other (See Comments)    Painful gynecomastia      Review of Systems negative except from HPI and PMH  Physical Exam BP 118/72   Pulse 61   Ht 6\' 2"  (1.88 m)   Wt 197 lb (89.4 kg)   SpO2 97%   BMI 25.29 kg/m  Well developed and nourished in no acute distress HENT normal Neck supple with JVP-flat Clear Device pocket well healed; without hematoma or erythema.  There is no tethering  Regular rate and rhythm, no murmurs or gallops Abd-soft with active BS No Clubbing cyanosis edema Skin-warm and dry A & Oriented  Grossly normal sensory and motor function  ECG sinus @61  20/10/45   Assessment and  Plan Ischemic cardiomyopathy status post CABG  Congestive heart failure-class II  Implantable defibrillator-single-chamber-Medtronic  Atrial fibrillation-paroxysmal postoperative  Laminated clot (still present MRI)  No intercurrent atrial fibrillation or flutter  Without symptoms of ischemia  Euvolemic continue current meds    Virl Axe

## 2018-02-19 NOTE — Patient Instructions (Signed)
Medication Instructions:  Your physician recommends that you continue on your current medications as directed. Please refer to the Current Medication list given to you today.  Labwork: None ordered.  Testing/Procedures: None ordered.  Follow-Up: Your physician recommends that you schedule a follow-up appointment in:   9 months with Dr Caryl Comes  Remote monitoring is used to monitor your Pacemaker of ICD from home. This monitoring reduces the number of office visits required to check your device to one time per year. It allows [place] to keep an eye on the functioning of your device to ensure it is working properly. You are scheduled for a device check from home on 05/22/2018. You may send your transmission at any time that day. If you have a wireless device, the transmission will be sent automatically. After your physician reviews your transmission, you will receive a postcard with your next transmission date.     Any Other Special Instructions Will Be Listed Below (If Applicable).     If you need a refill on your cardiac medications before your next appointment, please call your pharmacy.

## 2018-03-06 ENCOUNTER — Other Ambulatory Visit: Payer: Self-pay

## 2018-03-06 ENCOUNTER — Encounter (HOSPITAL_COMMUNITY): Payer: Self-pay

## 2018-03-06 ENCOUNTER — Ambulatory Visit (HOSPITAL_COMMUNITY)
Admission: RE | Admit: 2018-03-06 | Discharge: 2018-03-06 | Disposition: A | Discharge status: Home / Self Care | Payer: Medicare Other | Source: Ambulatory Visit | Attending: Internal Medicine | Admitting: Internal Medicine

## 2018-03-06 VITALS — BP 136/70 | HR 61 | Wt 197.5 lb

## 2018-03-06 DIAGNOSIS — I255 Ischemic cardiomyopathy: Secondary | ICD-10-CM | POA: Diagnosis not present

## 2018-03-06 DIAGNOSIS — I340 Nonrheumatic mitral (valve) insufficiency: Secondary | ICD-10-CM | POA: Diagnosis not present

## 2018-03-06 DIAGNOSIS — I2510 Atherosclerotic heart disease of native coronary artery without angina pectoris: Secondary | ICD-10-CM | POA: Insufficient documentation

## 2018-03-06 DIAGNOSIS — Z8249 Family history of ischemic heart disease and other diseases of the circulatory system: Secondary | ICD-10-CM | POA: Insufficient documentation

## 2018-03-06 DIAGNOSIS — I5042 Chronic combined systolic (congestive) and diastolic (congestive) heart failure: Secondary | ICD-10-CM

## 2018-03-06 DIAGNOSIS — Z951 Presence of aortocoronary bypass graft: Secondary | ICD-10-CM | POA: Diagnosis not present

## 2018-03-06 DIAGNOSIS — I5022 Chronic systolic (congestive) heart failure: Secondary | ICD-10-CM | POA: Diagnosis present

## 2018-03-06 DIAGNOSIS — I480 Paroxysmal atrial fibrillation: Secondary | ICD-10-CM | POA: Diagnosis not present

## 2018-03-06 DIAGNOSIS — Z8551 Personal history of malignant neoplasm of bladder: Secondary | ICD-10-CM | POA: Insufficient documentation

## 2018-03-06 DIAGNOSIS — J441 Chronic obstructive pulmonary disease with (acute) exacerbation: Secondary | ICD-10-CM | POA: Diagnosis not present

## 2018-03-06 DIAGNOSIS — Z7984 Long term (current) use of oral hypoglycemic drugs: Secondary | ICD-10-CM | POA: Insufficient documentation

## 2018-03-06 DIAGNOSIS — E119 Type 2 diabetes mellitus without complications: Secondary | ICD-10-CM | POA: Diagnosis not present

## 2018-03-06 DIAGNOSIS — Z87891 Personal history of nicotine dependence: Secondary | ICD-10-CM | POA: Insufficient documentation

## 2018-03-06 DIAGNOSIS — R0981 Nasal congestion: Secondary | ICD-10-CM | POA: Diagnosis not present

## 2018-03-06 DIAGNOSIS — Z79899 Other long term (current) drug therapy: Secondary | ICD-10-CM | POA: Diagnosis not present

## 2018-03-06 DIAGNOSIS — Z7901 Long term (current) use of anticoagulants: Secondary | ICD-10-CM | POA: Diagnosis not present

## 2018-03-06 DIAGNOSIS — I252 Old myocardial infarction: Secondary | ICD-10-CM | POA: Insufficient documentation

## 2018-03-06 DIAGNOSIS — I110 Hypertensive heart disease with heart failure: Secondary | ICD-10-CM | POA: Diagnosis not present

## 2018-03-06 DIAGNOSIS — I10 Essential (primary) hypertension: Secondary | ICD-10-CM

## 2018-03-06 LAB — BASIC METABOLIC PANEL
Anion gap: 7 (ref 5–15)
BUN: 20 mg/dL (ref 8–23)
CHLORIDE: 107 mmol/L (ref 98–111)
CO2: 25 mmol/L (ref 22–32)
Calcium: 9.2 mg/dL (ref 8.9–10.3)
Creatinine, Ser: 1.09 mg/dL (ref 0.61–1.24)
GFR calc Af Amer: >60 mL/min (ref >60)
GFR calc non Af Amer: >60 mL/min (ref >60)
Glucose, Bld: 144 mg/dL — ABNORMAL HIGH (ref 70–99)
POTASSIUM: 4.2 mmol/L (ref 3.5–5.1)
Sodium: 139 mmol/L (ref 135–145)

## 2018-03-06 MED ORDER — SACUBITRIL-VALSARTAN 49-51 MG PO TABS: 1.0000 | ORAL_TABLET | Freq: Two times a day (BID) | ORAL | 3 refills | Status: DC

## 2018-03-06 NOTE — Patient Instructions (Signed)
INCREASE Entresto to 49/51 mg, one tab twice a day  Labs today We will only contact you if something comes back abnormal or we need to make some changes. Otherwise no news is good news!  Labs needed in two weeks  Your physician recommends that you schedule a follow-up appointment in: 3 months with Dr Haroldine Laws   Do the following things EVERYDAY: 1) Weigh yourself in the morning before breakfast. Write it down and keep it in a log. 2) Take your medicines as prescribed 3) Eat low salt foods-Limit salt (sodium) to 2000 mg per day.  4) Stay as active as you can everyday 5) Limit all fluids for the day to less than 2 liters

## 2018-03-06 NOTE — Progress Notes (Signed)
Advanced Heart Failure Clinic Note   Primary Cardiologist: Dr. Haroldine Laws   HPI:  [name] [name] is a 72 y.o. male with h/o DM2, COPD (quit smoking 12/18), severe 3v CAD s/p CABG 63/78/5885, chronic systolic HF due to ICM, EF 25-30% (echo 12/18),  HTN, DM2, h/o bladder CA s/p multiple rounds of BCG therapy, and paroxysmal AF.   Pt admitted in 12/18 with anterior STEMI and developed cardiogenic shock requiring Impella support. Urgent cath showed severe 3v CAD as below. While on impella, pt developed sepsis with WBC ~ 40K, UCx + pseudomonas. Treated for AECOPD and completed ABX therapy. BCx negative. Stabilized and underwent CABG x 2 with LIMA to LAD and SVG to OM on 04/08/2017. Pt transiently required milrinone but weaned prior to discharge. Required amiodarone for brief post op afib, but had no recurrence.   Echo 08/07/17 EF 35-40% laminated apical clot. (Dr.Bensimhon felt ~ 25-30%) and moderate to severe MR.   Cardiac MRI 09/2017 EF 30%, laminated apical thrombus, normal RV size and function, LGE pattern suggests infarction in LAD territory, unlikely to improve with revascularization  S/p Medtronic ICD with Dr Caryl Comes 11/20/17.  He presents today for regular follow up. Last visit, losartan switched to [facility] and ASA was stopped. Overall doing fine. He is slightly SOB when he first wakes up in the morning, but resolves after a few minutes. Exercises 3x/week for 1 hour at the [facility] on stationary bike and weight machines. No orthopnea, PND, or edema. No CP, palpitations, or dizziness. No bleeding on Eliquis. Main complaint is nasal congestion. He has been using Afrin daily for months. He does not weigh. Weight up 9 lbs on our scale. Taking all meds. Does not limit fluid or salt. Does not need lasix.    Optivol fax did not come through.   Review of systems complete and found to be negative unless listed in HPI.   [facility] 03/31/2017  Prox RCA lesion is 100% stenosed. Dist RCA lesion is 100% stenosed  (retrograde filling from collaterals does not go beyond the distal vessel) -->faint collateral filling to the distal PL and PDA from septal perforators and a diffusely diseased AV groove circumflex  LAV Groove lesion is 90% stenosed.  Prox LAD-1 lesion is 95% stenosed. Prox LAD-2 lesion is 85% stenosed. Heavily calcified, tandem lesions in very tortuous segment of the vessel.  Prox Cx to Mid Cx lesion is 70% stenosed. Heavily calcified  There is severe left ventricular systolic dysfunction. The left ventricular ejection fraction is less than 25% by visual estimate.  LV end diastolic pressure is severely elevated -prior to Impella insertion  Hemodynamic findings consistent with moderate pulmonary hypertension.  Successful Impella insertion with 3.5-3.6 LPM flow's  Echo 04/01/2017 EF 15-20%  Echo 04/16/17 (post op) Improved to 25-30% on 04/16/17 Echo 08/07/17: EF 35-40%, mod to severe MR  Past Medical History:  Diagnosis Date  . Bladder cancer ([place])   . Borderline glaucoma   . CHF (congestive heart failure) ([place])   . Coronary artery disease   . Diabetes mellitus type 2, controlled ([place]) ORAL MED  . Essential hypertension     Current Outpatient Medications  Medication Sig Dispense Refill  . apixaban (ELIQUIS) 5 MG TABS tablet Take 1 tablet (5 mg total) by mouth 2 (two) times daily. Resume 11/25/17 180 tablet 3  . atorvastatin (LIPITOR) 80 MG tablet Take 1 tablet (80 mg total) by mouth at bedtime. 30 tablet 1  . carvedilol (COREG) 3.125 MG tablet TAKE 1 TABLET (3.125  MG TOTAL) BY MOUTH 2 (TWO) TIMES DAILY. 60 tablet 5  . eplerenone (INSPRA) 25 MG tablet Take 25 mg by mouth daily.    . metFORMIN (GLUCOPHAGE-XR) 500 MG 24 hr tablet Take 500 mg by mouth 2 (two) times daily.    . mometasone (NASONEX) 50 MCG/ACT nasal spray Place 2 sprays into the nose daily.     . sacubitril-valsartan (ENTRESTO) 24-26 MG Take 1 tablet by mouth 2 (two) times daily. 180 tablet 3   No current  facility-administered medications for this encounter.     Allergies  Allergen Reactions  . Spironolactone Other (See Comments)    Painful gynecomastia      Social History   Socioeconomic History  . Marital status: Married    Spouse name: Not on file  . Number of children: Not on file  . Years of education: Not on file  . Highest education level: Not on file  Occupational History  . Not on file  Social Needs  . Financial resource strain: Not on file  . Food insecurity:    Worry: Not on file    Inability: Not on file  . Transportation needs:    Medical: Not on file    Non-medical: Not on file  Tobacco Use  . Smoking status: Former Smoker    Packs/day: 1.00    Years: 40.00    Pack years: 40.00    Types: Cigarettes    Last attempt to quit: 03/31/2017    Years since quitting: 0.9  . Smokeless tobacco: Never Used  Substance and Sexual Activity  . Alcohol use: No  . Drug use: No  . Sexual activity: Not on file  Lifestyle  . Physical activity:    Days per week: 0 days    Minutes per session: 0 min  . Stress: Only a little  Relationships  . Social connections:    Talks on phone: Not on file    Gets together: Not on file    Attends religious service: Not on file    Active member of club or organization: Not on file    Attends meetings of clubs or organizations: Not on file    Relationship status: Not on file  . Intimate partner violence:    Fear of current or ex partner: Not on file    Emotionally abused: Not on file    Physically abused: Not on file    Forced sexual activity: Not on file  Other Topics Concern  . Not on file  Social History Narrative  . Not on file   Family History  Problem Relation Age of Onset  . Heart attack Sister   . Heart disease Sister     Vitals:   03/06/18 1328  BP: 136/70  Pulse: 61  SpO2: 98%  Weight: 89.6 kg (197 lb 8 oz)   Wt Readings from Last 3 Encounters:  03/06/18 89.6 kg (197 lb 8 oz)  02/19/18 89.4 kg (197 lb)    12/04/17 85.3 kg (188 lb 2 oz)    PHYSICAL EXAM: General: Well appearing. No resp difficulty. HEENT: Normal Neck: Supple. JVP ~8. Carotids 2+ bilat; no bruits. No thyromegaly or nodule noted. Cor: PMI laterally displaced. RRR, No M/G/R noted Lungs: CTAB, normal effort. Abdomen: Soft, non-tender, non-distended, no HSM. No bruits or masses. +BS  Extremities: No cyanosis, clubbing, or rash. R and LLE no edema.  Neuro: Alert & orientedx3, cranial nerves grossly intact. moves all 4 extremities w/o difficulty. Affect pleasant   ASSESSMENT &  PLAN:  1. Chronic systolic CHF due to ICM  - EF 04/01/17 EF 15-20% -> Improved to 25-30% on 04/16/17 Post op CABG as below.  - Echo 08/07/17 read as EF 35-40% but I reviewed personally and EF 25-30% (reviewed with Dr. Meda Coffee who agrees) + laminated apical clot and moderate to severe MR. RV ok. Will check cMRI to confirm and refer for ICD if EF <= 35% - Cardiac MRI 09/2017 EF 30%, laminated apical thrombus, normal RV size and function, LGE pattern suggests infarction in LAD territory, unlikely to improve with revascularization - S/p Medtronic ICD with Dr Caryl Comes 11/20/17 - Stable NYHA II.   - Volume status mildly up on exam. Not on lasix. - Continue carvedilol 3.125 bid. HR too low to increase - Continue eplerenone 25 mg daily. (painful gynecomastia with spiro) - Increase Entresto to 49/51 mg BID.  - Discussed limiting fluid and salt intake and importance of daily weights  2. Mitral regurgitation - moderate to severe on echo 07/2017 - likely functional - will need to follow closely. - if not improving with therapy may need to consider MitraClip. No change.   3. CAD - s/p CABG with LIMA to LAD and SVG to OM on 04/08/2017 - No s/s ischemia.  - Continue statin, BB. No ASA with Eliquis use. - He has finished cardiac rehab. He is not sure if his insurance pays for maintenance program. He is now working out at [employer]  4. Tobacco abuse: Smoked 1 ppd PTA  for > 50 years.  - Remains quit. No change.   5. Hx of bladder CA:  - s/p Multiple rounds of BCG therapy.   6. Paroxysmal Atrial fibrillation - Regular on exam. Off amio - Continue Eliquis. Denies bleeding.   7. LV clot - Laminated - On Eliquis for PAF. No change.  8. DM2 - Consider Jardiance.   9. Nasal Congestion - Has been using Afrin daily for months. Told him he is only supposed to use afrin for 3 days and longer use can cause rebound congestion. Reassured him that I do not think it is a side effect from his medications.   Increase Entresto to 49/51 mg BID BMET today and in 2 weeks Follow up in 3 months with Dr Haroldine Laws  Georgiana Shore, NP 03/06/18   Greater than 50% of the 25 minute visit was spent in counseling/coordination of care regarding disease state education, salt/fluid restriction, sliding scale diuretics, and medication compliance.

## 2018-03-20 ENCOUNTER — Ambulatory Visit (HOSPITAL_COMMUNITY)
Admission: RE | Admit: 2018-03-20 | Discharge: 2018-03-20 | Disposition: A | Discharge status: Home / Self Care | Payer: Medicare Other | Source: Ambulatory Visit | Attending: Internal Medicine | Admitting: Internal Medicine

## 2018-03-20 DIAGNOSIS — I5042 Chronic combined systolic (congestive) and diastolic (congestive) heart failure: Secondary | ICD-10-CM | POA: Insufficient documentation

## 2018-03-20 LAB — BASIC METABOLIC PANEL
Anion gap: 7 (ref 5–15)
BUN: 21 mg/dL (ref 8–23)
CHLORIDE: 107 mmol/L (ref 98–111)
CO2: 23 mmol/L (ref 22–32)
CREATININE: 1.27 mg/dL — AB (ref 0.61–1.24)
Calcium: 9.4 mg/dL (ref 8.9–10.3)
GFR calc Af Amer: >60 mL/min (ref >60)
GFR calc non Af Amer: 55 mL/min — ABNORMAL LOW (ref >60)
GLUCOSE: 132 mg/dL — AB (ref 70–99)
Potassium: 4.8 mmol/L (ref 3.5–5.1)
Sodium: 137 mmol/L (ref 135–145)

## 2018-03-23 ENCOUNTER — Other Ambulatory Visit (HOSPITAL_COMMUNITY): Payer: Self-pay | Admitting: Internal Medicine

## 2018-03-29 LAB — CUP PACEART INCLINIC DEVICE CHECK
Battery Remaining Longevity: 132 mo
Battery Voltage: 3.07 V
Brady Statistic RV Percent Paced: 0.25 %
Date Time Interrogation Session: 20191023183527
HighPow Impedance: 63 Ohm
Implantable Lead Implant Date: 20190724
Implantable Lead Location: 753860
Implantable Pulse Generator Implant Date: 20190724
Lead Channel Impedance Value: 342 Ohm
Lead Channel Impedance Value: 399 Ohm
Lead Channel Pacing Threshold Amplitude: 0.5 V
Lead Channel Pacing Threshold Pulse Width: 0.4 ms
Lead Channel Sensing Intrinsic Amplitude: 12 mV
Lead Channel Setting Pacing Amplitude: 3.25 V
Lead Channel Setting Pacing Pulse Width: 0.4 ms
Lead Channel Setting Sensing Sensitivity: 0.3 mV

## 2018-04-18 ENCOUNTER — Other Ambulatory Visit: Payer: Self-pay | Admitting: Internal Medicine

## 2018-05-21 ENCOUNTER — Ambulatory Visit (INDEPENDENT_AMBULATORY_CARE_PROVIDER_SITE_OTHER): Payer: Medicare Other

## 2018-05-21 DIAGNOSIS — I255 Ischemic cardiomyopathy: Secondary | ICD-10-CM | POA: Diagnosis not present

## 2018-05-21 DIAGNOSIS — I5022 Chronic systolic (congestive) heart failure: Secondary | ICD-10-CM

## 2018-05-22 NOTE — Progress Notes (Signed)
Remote ICD transmission.   

## 2018-05-23 ENCOUNTER — Encounter: Payer: Self-pay | Admitting: Cardiology

## 2018-05-25 LAB — CUP PACEART REMOTE DEVICE CHECK
Battery Remaining Longevity: 130 mo
Battery Voltage: 3.05 V
Brady Statistic RV Percent Paced: 0.63 %
Date Time Interrogation Session: 20200122093823
HIGH POWER IMPEDANCE MEASURED VALUE: 60 Ohm
Implantable Pulse Generator Implant Date: 20190724
Lead Channel Impedance Value: 323 Ohm
Lead Channel Impedance Value: 380 Ohm
Lead Channel Sensing Intrinsic Amplitude: 12.125 mV
Lead Channel Setting Pacing Pulse Width: 0.4 ms
Lead Channel Setting Sensing Sensitivity: 0.3 mV
MDC IDC LEAD IMPLANT DT: 20190724
MDC IDC LEAD LOCATION: 753860
MDC IDC MSMT LEADCHNL RV PACING THRESHOLD AMPLITUDE: 0.5 V
MDC IDC MSMT LEADCHNL RV PACING THRESHOLD PULSEWIDTH: 0.4 ms
MDC IDC MSMT LEADCHNL RV SENSING INTR AMPL: 12.125 mV
MDC IDC SET LEADCHNL RV PACING AMPLITUDE: 2.5 V

## 2018-06-11 ENCOUNTER — Ambulatory Visit (HOSPITAL_COMMUNITY)
Admission: RE | Admit: 2018-06-11 | Discharge: 2018-06-11 | Disposition: A | Discharge status: Home / Self Care | Payer: Medicare Other | Source: Ambulatory Visit | Attending: Internal Medicine | Admitting: Internal Medicine

## 2018-06-11 ENCOUNTER — Encounter (HOSPITAL_COMMUNITY): Payer: Self-pay | Admitting: Internal Medicine

## 2018-06-11 VITALS — BP 108/60 | HR 73 | Wt 203.0 lb

## 2018-06-11 DIAGNOSIS — Z8249 Family history of ischemic heart disease and other diseases of the circulatory system: Secondary | ICD-10-CM | POA: Insufficient documentation

## 2018-06-11 DIAGNOSIS — I110 Hypertensive heart disease with heart failure: Secondary | ICD-10-CM | POA: Insufficient documentation

## 2018-06-11 DIAGNOSIS — Z7984 Long term (current) use of oral hypoglycemic drugs: Secondary | ICD-10-CM | POA: Diagnosis not present

## 2018-06-11 DIAGNOSIS — I252 Old myocardial infarction: Secondary | ICD-10-CM | POA: Insufficient documentation

## 2018-06-11 DIAGNOSIS — Z79899 Other long term (current) drug therapy: Secondary | ICD-10-CM | POA: Diagnosis not present

## 2018-06-11 DIAGNOSIS — I340 Nonrheumatic mitral (valve) insufficiency: Secondary | ICD-10-CM | POA: Diagnosis not present

## 2018-06-11 DIAGNOSIS — E119 Type 2 diabetes mellitus without complications: Secondary | ICD-10-CM | POA: Insufficient documentation

## 2018-06-11 DIAGNOSIS — J441 Chronic obstructive pulmonary disease with (acute) exacerbation: Secondary | ICD-10-CM | POA: Insufficient documentation

## 2018-06-11 DIAGNOSIS — I2510 Atherosclerotic heart disease of native coronary artery without angina pectoris: Secondary | ICD-10-CM | POA: Diagnosis not present

## 2018-06-11 DIAGNOSIS — I5022 Chronic systolic (congestive) heart failure: Secondary | ICD-10-CM | POA: Insufficient documentation

## 2018-06-11 DIAGNOSIS — Z87891 Personal history of nicotine dependence: Secondary | ICD-10-CM | POA: Insufficient documentation

## 2018-06-11 DIAGNOSIS — N62 Hypertrophy of breast: Secondary | ICD-10-CM | POA: Diagnosis not present

## 2018-06-11 DIAGNOSIS — Z951 Presence of aortocoronary bypass graft: Secondary | ICD-10-CM | POA: Diagnosis not present

## 2018-06-11 DIAGNOSIS — I480 Paroxysmal atrial fibrillation: Secondary | ICD-10-CM | POA: Diagnosis not present

## 2018-06-11 DIAGNOSIS — Z7901 Long term (current) use of anticoagulants: Secondary | ICD-10-CM | POA: Insufficient documentation

## 2018-06-11 DIAGNOSIS — I5042 Chronic combined systolic (congestive) and diastolic (congestive) heart failure: Secondary | ICD-10-CM | POA: Diagnosis not present

## 2018-06-11 DIAGNOSIS — R197 Diarrhea, unspecified: Secondary | ICD-10-CM | POA: Insufficient documentation

## 2018-06-11 NOTE — Patient Instructions (Signed)
EKG done today.  Your physician has requested that you have an echocardiogram. Echocardiography is a painless test that uses sound waves to create images of your heart. It provides your doctor with information about the size and shape of your heart and how well your heart's chambers and valves are working. This procedure takes approximately one hour. There are no restrictions for this procedure. This will take place on your next follow up visit.  Follow up with Dr. Haroldine Laws in 3-4 months

## 2018-06-11 NOTE — Progress Notes (Signed)
Advanced Heart Failure Clinic Note   Primary Cardiologist: Dr. Haroldine Laws   HPI:  [name] [name] is a 73 y.o. male with h/o DM2, COPD (quit smoking 12/18), severe 3v CAD s/p CABG 68/34/1962, chronic systolic HF due to ICM, EF 25-30% (echo 12/18),  HTN, DM2, h/o bladder CA s/p multiple rounds of BCG therapy, and paroxysmal AF.   Pt admitted in 12/18 with anterior STEMI and developed cardiogenic shock requiring Impella support. Urgent cath showed severe 3v CAD as below. While on impella, pt developed sepsis with WBC ~ 40K, UCx + pseudomonas. Treated for AECOPD and completed ABX therapy. BCx negative. Stabilized and underwent CABG x 2 with LIMA to LAD and SVG to OM on 04/08/2017. Pt transiently required milrinone but weaned prior to discharge. Required amiodarone for brief post op afib, but had no recurrence.   Echo 08/07/17 EF 35-40% laminated apical clot. (Dr.Bensimhon felt ~ 25-30%) and moderate to severe MR.   Cardiac MRI 09/2017 EF 30%, laminated apical thrombus, normal RV size and function, LGE pattern suggests infarction in LAD territory, unlikely to improve with revascularization  S/p Medtronic ICD with Dr Caryl Comes 11/20/17.  He returns today for HF follow up. Last visit Delene Loll was increased to 49/51. Overall doing fine. Only complaint is frequent diarrhea, several times/week. No blood. Feels SOB when he first wakes up in the morning, but resolves after a few minutes. Goes to the [facility] 3x/week. Does stationary bike for 30 minutes and does weight machines. No CP or SOB with exercise. No edema, orthopnea, or PND. No dizziness. Appetite is good. Taking all medications. Not weighing at home. Weight up 6 lbs on our scale.   Optivol: thoracic impedence above threshold. No recent AF. No VT/VF. Active 1-2 hours/day.  Review of systems complete and found to be negative unless listed in HPI.   [facility] 03/31/2017  Prox RCA lesion is 100% stenosed. Dist RCA lesion is 100% stenosed (retrograde filling  from collaterals does not go beyond the distal vessel) -->faint collateral filling to the distal PL and PDA from septal perforators and a diffusely diseased AV groove circumflex  LAV Groove lesion is 90% stenosed.  Prox LAD-1 lesion is 95% stenosed. Prox LAD-2 lesion is 85% stenosed. Heavily calcified, tandem lesions in very tortuous segment of the vessel.  Prox Cx to Mid Cx lesion is 70% stenosed. Heavily calcified  There is severe left ventricular systolic dysfunction. The left ventricular ejection fraction is less than 25% by visual estimate.  LV end diastolic pressure is severely elevated -prior to Impella insertion  Hemodynamic findings consistent with moderate pulmonary hypertension.  Successful Impella insertion with 3.5-3.6 LPM flow's  Echo 04/01/2017 EF 15-20%  Echo 04/16/17 (post op) Improved to 25-30% on 04/16/17 Echo 08/07/17: EF 35-40%, mod to severe MR  Past Medical History:  Diagnosis Date  . Bladder cancer ([place])   . Borderline glaucoma   . CHF (congestive heart failure) ([place])   . Coronary artery disease   . Diabetes mellitus type 2, controlled ([place]) ORAL MED  . Essential hypertension     Current Outpatient Medications  Medication Sig Dispense Refill  . apixaban (ELIQUIS) 5 MG TABS tablet Take 1 tablet (5 mg total) by mouth 2 (two) times daily. Resume 11/25/17 180 tablet 3  . atorvastatin (LIPITOR) 80 MG tablet Take 1 tablet (80 mg total) by mouth at bedtime. 30 tablet 1  . carvedilol (COREG) 3.125 MG tablet TAKE 1 TABLET (3.125 MG TOTAL) BY MOUTH 2 (TWO) TIMES DAILY. 180 tablet 1  .  eplerenone (INSPRA) 25 MG tablet TAKE 1 TABLET BY MOUTH EVERY DAY 90 tablet 1  . metFORMIN (GLUCOPHAGE-XR) 500 MG 24 hr tablet Take 500 mg by mouth 2 (two) times daily.    . mometasone (NASONEX) 50 MCG/ACT nasal spray Place 2 sprays into the nose daily.     . sacubitril-valsartan (ENTRESTO) 49-51 MG Take 1 tablet by mouth 2 (two) times daily. 180 tablet 3   No current  facility-administered medications for this encounter.     Allergies  Allergen Reactions  . Spironolactone Other (See Comments)    Painful gynecomastia      Social History   Socioeconomic History  . Marital status: Married    Spouse name: Not on file  . Number of children: Not on file  . Years of education: Not on file  . Highest education level: Not on file  Occupational History  . Not on file  Social Needs  . Financial resource strain: Not on file  . Food insecurity:    Worry: Not on file    Inability: Not on file  . Transportation needs:    Medical: Not on file    Non-medical: Not on file  Tobacco Use  . Smoking status: Former Smoker    Packs/day: 1.00    Years: 40.00    Pack years: 40.00    Types: Cigarettes    Last attempt to quit: 03/31/2017    Years since quitting: 1.1  . Smokeless tobacco: Never Used  Substance and Sexual Activity  . Alcohol use: No  . Drug use: No  . Sexual activity: Not on file  Lifestyle  . Physical activity:    Days per week: 0 days    Minutes per session: 0 min  . Stress: Only a little  Relationships  . Social connections:    Talks on phone: Not on file    Gets together: Not on file    Attends religious service: Not on file    Active member of club or organization: Not on file    Attends meetings of clubs or organizations: Not on file    Relationship status: Not on file  . Intimate partner violence:    Fear of current or ex partner: Not on file    Emotionally abused: Not on file    Physically abused: Not on file    Forced sexual activity: Not on file  Other Topics Concern  . Not on file  Social History Narrative  . Not on file   Family History  Problem Relation Age of Onset  . Heart attack Sister   . Heart disease Sister     Vitals:   06/11/18 1343  BP: 108/60  Pulse: 73  SpO2: 96%  Weight: 92.1 kg (203 lb)   Wt Readings from Last 3 Encounters:  06/11/18 92.1 kg (203 lb)  03/06/18 89.6 kg (197 lb 8 oz)    02/19/18 89.4 kg (197 lb)    PHYSICAL EXAM: General: Well appearing. No resp difficulty. HEENT: Normal Neck: Supple. JVP 5-6. Carotids 2+ bilat; no bruits. No thyromegaly or nodule noted. Cor: PMI laterally displaced. RRR, No M/G/R noted Lungs: CTAB, normal effort. Abdomen: Soft, non-tender, non-distended, no HSM. No bruits or masses. +BS  Extremities: No cyanosis, clubbing, or rash. R and LLE no edema.  Neuro: Alert & orientedx3, cranial nerves grossly intact. moves all 4 extremities w/o difficulty. Affect pleasant  ASSESSMENT & PLAN:  1. Chronic systolic CHF due to ICM  - EF 04/01/17 EF 15-20% ->  Improved to 25-30% on 04/16/17 Post op CABG as below.  - Echo 08/07/17 read as EF 35-40% but Dr Haroldine Laws reviewed personally and EF 25-30% (reviewed with Dr. Meda Coffee who agrees) + laminated apical clot and moderate to severe MR. RV ok.  - Cardiac MRI 09/2017 EF 30%, laminated apical thrombus, normal RV size and function, LGE pattern suggests infarction in LAD territory, unlikely to improve with revascularization - S/p Medtronic ICD with Dr Caryl Comes 11/20/17 - Stable NYHA II.   - Volume status stable. Not on lasix. - Continue carvedilol 3.125 bid.  - Continue eplerenone 25 mg daily. (painful gynecomastia with spiro) - Continue Entresto 49/51 mg BID.  - Discussed limiting fluid and salt intake and importance of daily weights  2. Mitral regurgitation - moderate to severe on echo 07/2017 - likely functional - will need to follow closely. - if not improving with therapy may need to consider MitraClip.  - Set up for repeat echo.   3. CAD - s/p CABG with LIMA to LAD and SVG to OM on 04/08/2017 - No s/s ischemia.  - Continue statin, BB. No ASA with Eliquis use. - Completed cardiac rehab. Continues to work out at the [facility].   4. Tobacco abuse: Smoked 1 ppd PTA for > 50 years.  - Remains quit. No change.   5. Hx of bladder CA:  - s/p Multiple rounds of BCG therapy.   6. Paroxysmal  Atrial fibrillation - Regular on exam. Off amio. Last episode of AF in November per device interrogation.  - Continue Eliquis. Denies bleeding.   7. LV clot - Laminated - On Eliquis for PAF. No change.   8. DM2 - Consider Jardiance. No change.    Georgiana Shore, NP 06/11/18   Patient seen and examined with the above-signed Advanced Practice Provider and/or Housestaff. I personally reviewed laboratory data, imaging studies and relevant notes. I independently examined the patient and formulated the important aspects of the plan. I have edited the note to reflect any of my changes or salient points. I have personally discussed the plan with the patient and/or family.  He is doing very well. NYHA II. Volume status looks good. No ischemic symptoms. Remains in NSR today on exam and ECG. WIll see back in 3 months with echo.   Glori Bickers, MD  3:13 PM

## 2018-08-20 ENCOUNTER — Ambulatory Visit (INDEPENDENT_AMBULATORY_CARE_PROVIDER_SITE_OTHER): Payer: Medicare Other | Admitting: *Deleted

## 2018-08-20 ENCOUNTER — Other Ambulatory Visit: Payer: Self-pay

## 2018-08-20 DIAGNOSIS — I255 Ischemic cardiomyopathy: Secondary | ICD-10-CM | POA: Diagnosis not present

## 2018-08-21 LAB — CUP PACEART REMOTE DEVICE CHECK
Battery Remaining Longevity: 128 mo
Battery Voltage: 3.03 V
Brady Statistic RV Percent Paced: 1.01 %
Date Time Interrogation Session: 20200423043424
HighPow Impedance: 67 Ohm
Implantable Lead Implant Date: 20190724
Implantable Lead Location: 753860
Implantable Pulse Generator Implant Date: 20190724
Lead Channel Impedance Value: 342 Ohm
Lead Channel Impedance Value: 399 Ohm
Lead Channel Pacing Threshold Amplitude: 0.5 V
Lead Channel Pacing Threshold Pulse Width: 0.4 ms
Lead Channel Sensing Intrinsic Amplitude: 13.25 mV
Lead Channel Setting Pacing Amplitude: 2.5 V
Lead Channel Setting Pacing Pulse Width: 0.4 ms
Lead Channel Setting Sensing Sensitivity: 0.3 mV

## 2018-08-25 ENCOUNTER — Other Ambulatory Visit (HOSPITAL_COMMUNITY): Payer: Self-pay | Admitting: Internal Medicine

## 2018-08-28 ENCOUNTER — Encounter: Payer: Self-pay | Admitting: Cardiology

## 2018-08-28 NOTE — Progress Notes (Signed)
Remote ICD transmission.   

## 2018-09-10 ENCOUNTER — Other Ambulatory Visit (HOSPITAL_COMMUNITY): Payer: Medicare Other

## 2018-09-10 ENCOUNTER — Encounter (HOSPITAL_COMMUNITY): Payer: Medicare Other | Admitting: Internal Medicine

## 2018-09-18 ENCOUNTER — Other Ambulatory Visit (HOSPITAL_COMMUNITY): Payer: Self-pay | Admitting: Internal Medicine

## 2018-11-19 ENCOUNTER — Ambulatory Visit (INDEPENDENT_AMBULATORY_CARE_PROVIDER_SITE_OTHER): Payer: Medicare Other | Admitting: *Deleted

## 2018-11-19 DIAGNOSIS — I5042 Chronic combined systolic (congestive) and diastolic (congestive) heart failure: Secondary | ICD-10-CM | POA: Diagnosis not present

## 2018-11-19 DIAGNOSIS — I255 Ischemic cardiomyopathy: Secondary | ICD-10-CM

## 2018-11-20 LAB — CUP PACEART REMOTE DEVICE CHECK
Battery Remaining Longevity: 126 mo
Battery Voltage: 3.02 V
Brady Statistic RV Percent Paced: 0.16 %
Date Time Interrogation Session: 20200723075135
HighPow Impedance: 70 Ohm
Implantable Lead Implant Date: 20190724
Implantable Lead Location: 753860
Implantable Pulse Generator Implant Date: 20190724
Lead Channel Impedance Value: 342 Ohm
Lead Channel Impedance Value: 399 Ohm
Lead Channel Pacing Threshold Amplitude: 0.5 V
Lead Channel Pacing Threshold Pulse Width: 0.4 ms
Lead Channel Sensing Intrinsic Amplitude: 14.25 mV
Lead Channel Setting Pacing Amplitude: 2.5 V
Lead Channel Setting Pacing Pulse Width: 0.4 ms
Lead Channel Setting Sensing Sensitivity: 0.3 mV

## 2018-12-04 NOTE — Progress Notes (Signed)
Remote ICD transmission.   

## 2018-12-10 ENCOUNTER — Ambulatory Visit (HOSPITAL_BASED_OUTPATIENT_CLINIC_OR_DEPARTMENT_OTHER)
Admission: RE | Admit: 2018-12-10 | Discharge: 2018-12-10 | Disposition: A | Discharge status: Home / Self Care | Payer: Medicare Other | Source: Ambulatory Visit | Attending: Internal Medicine | Admitting: Internal Medicine

## 2018-12-10 ENCOUNTER — Encounter (HOSPITAL_COMMUNITY): Payer: Self-pay | Admitting: Internal Medicine

## 2018-12-10 ENCOUNTER — Other Ambulatory Visit: Payer: Self-pay

## 2018-12-10 ENCOUNTER — Ambulatory Visit (HOSPITAL_COMMUNITY)
Admission: RE | Admit: 2018-12-10 | Discharge: 2018-12-10 | Disposition: A | Discharge status: Home / Self Care | Payer: Medicare Other | Source: Ambulatory Visit | Attending: Internal Medicine | Admitting: Internal Medicine

## 2018-12-10 VITALS — BP 126/68 | HR 62 | Wt 211.2 lb

## 2018-12-10 DIAGNOSIS — M25551 Pain in right hip: Secondary | ICD-10-CM | POA: Diagnosis not present

## 2018-12-10 DIAGNOSIS — Z7984 Long term (current) use of oral hypoglycemic drugs: Secondary | ICD-10-CM | POA: Diagnosis not present

## 2018-12-10 DIAGNOSIS — I5022 Chronic systolic (congestive) heart failure: Secondary | ICD-10-CM | POA: Insufficient documentation

## 2018-12-10 DIAGNOSIS — E119 Type 2 diabetes mellitus without complications: Secondary | ICD-10-CM | POA: Insufficient documentation

## 2018-12-10 DIAGNOSIS — I5042 Chronic combined systolic (congestive) and diastolic (congestive) heart failure: Secondary | ICD-10-CM | POA: Diagnosis present

## 2018-12-10 DIAGNOSIS — Z8249 Family history of ischemic heart disease and other diseases of the circulatory system: Secondary | ICD-10-CM | POA: Diagnosis not present

## 2018-12-10 DIAGNOSIS — I480 Paroxysmal atrial fibrillation: Secondary | ICD-10-CM

## 2018-12-10 DIAGNOSIS — I110 Hypertensive heart disease with heart failure: Secondary | ICD-10-CM | POA: Diagnosis not present

## 2018-12-10 DIAGNOSIS — I255 Ischemic cardiomyopathy: Secondary | ICD-10-CM | POA: Insufficient documentation

## 2018-12-10 DIAGNOSIS — I2510 Atherosclerotic heart disease of native coronary artery without angina pectoris: Secondary | ICD-10-CM | POA: Diagnosis not present

## 2018-12-10 DIAGNOSIS — I340 Nonrheumatic mitral (valve) insufficiency: Secondary | ICD-10-CM | POA: Diagnosis not present

## 2018-12-10 DIAGNOSIS — M1612 Unilateral primary osteoarthritis, left hip: Secondary | ICD-10-CM | POA: Insufficient documentation

## 2018-12-10 DIAGNOSIS — I236 Thrombosis of atrium, auricular appendage, and ventricle as current complications following acute myocardial infarction: Secondary | ICD-10-CM

## 2018-12-10 DIAGNOSIS — Z79899 Other long term (current) drug therapy: Secondary | ICD-10-CM | POA: Diagnosis not present

## 2018-12-10 DIAGNOSIS — Z95810 Presence of automatic (implantable) cardiac defibrillator: Secondary | ICD-10-CM | POA: Insufficient documentation

## 2018-12-10 DIAGNOSIS — Z8551 Personal history of malignant neoplasm of bladder: Secondary | ICD-10-CM | POA: Insufficient documentation

## 2018-12-10 DIAGNOSIS — Z87891 Personal history of nicotine dependence: Secondary | ICD-10-CM | POA: Diagnosis not present

## 2018-12-10 DIAGNOSIS — Z951 Presence of aortocoronary bypass graft: Secondary | ICD-10-CM | POA: Diagnosis not present

## 2018-12-10 LAB — CBC
HCT: 48.6 % (ref 39.0–52.0)
Hemoglobin: 15.7 g/dL (ref 13.0–17.0)
MCH: 29.4 pg (ref 26.0–34.0)
MCHC: 32.3 g/dL (ref 30.0–36.0)
MCV: 91 fL (ref 80.0–100.0)
Platelets: 202 10*3/uL (ref 150–400)
RBC: 5.34 MIL/uL (ref 4.22–5.81)
RDW: 13.1 % (ref 11.5–15.5)
WBC: 10 10*3/uL (ref 4.0–10.5)
nRBC: 0 % (ref 0.0–0.2)

## 2018-12-10 LAB — BASIC METABOLIC PANEL
Anion gap: 10 (ref 5–15)
BUN: 20 mg/dL (ref 8–23)
CO2: 21 mmol/L — ABNORMAL LOW (ref 22–32)
Calcium: 9.2 mg/dL (ref 8.9–10.3)
Chloride: 106 mmol/L (ref 98–111)
Creatinine, Ser: 1.32 mg/dL — ABNORMAL HIGH (ref 0.61–1.24)
GFR calc Af Amer: >60 mL/min (ref >60)
GFR calc non Af Amer: 53 mL/min — ABNORMAL LOW (ref >60)
Glucose, Bld: 153 mg/dL — ABNORMAL HIGH (ref 70–99)
Potassium: 5.3 mmol/L — ABNORMAL HIGH (ref 3.5–5.1)
Sodium: 137 mmol/L (ref 135–145)

## 2018-12-10 LAB — BRAIN NATRIURETIC PEPTIDE: B Natriuretic Peptide: 261.3 pg/mL — ABNORMAL HIGH (ref 0.0–100.0)

## 2018-12-10 MED ORDER — EMPAGLIFLOZIN 10 MG PO TABS: 10.0000 mg | ORAL_TABLET | Freq: Every day | ORAL | 6 refills | Status: DC

## 2018-12-10 MED ORDER — SACUBITRIL-VALSARTAN 97-103 MG PO TABS: 1.0000 | ORAL_TABLET | Freq: Two times a day (BID) | ORAL | 3 refills | Status: DC

## 2018-12-10 MED ORDER — SACUBITRIL-VALSARTAN 97-103 MG PO TABS: 1.0000 | ORAL_TABLET | Freq: Two times a day (BID) | ORAL | 6 refills | Status: DC

## 2018-12-10 MED ORDER — EMPAGLIFLOZIN 10 MG PO TABS: 10.0000 mg | ORAL_TABLET | Freq: Every day | ORAL | 3 refills | Status: DC

## 2018-12-10 MED ORDER — PERFLUTREN LIPID MICROSPHERE
1.0000 mL | INTRAVENOUS | Status: DC | PRN
  Administered 2018-12-10: 3 mL via INTRAVENOUS
  Filled 2018-12-10: qty 10

## 2018-12-10 NOTE — Progress Notes (Signed)
Advanced Heart Failure Clinic Note   Primary Cardiologist: Dr. Haroldine Laws   HPI:  [name] [name] is a 73 y.o. male with h/o DM2, COPD (quit smoking 12/18), severe 3v CAD s/p CABG 14/48/1856, chronic systolic HF due to ICM, EF 25-30% (echo 12/18),  HTN, DM2, h/o bladder CA s/p multiple rounds of BCG therapy, and paroxysmal AF.   Pt admitted in 12/18 with anterior STEMI and developed cardiogenic shock requiring Impella support. Urgent cath showed severe 3v CAD as below. While on impella, pt developed sepsis with WBC ~ 40K, UCx + pseudomonas. Treated for AECOPD and completed ABX therapy. BCx negative. Stabilized and underwent CABG x 2 with LIMA to LAD and SVG to OM on 04/08/2017. Pt transiently required milrinone but weaned prior to discharge. Required amiodarone for brief post op afib, but had no recurrence.   Echo 08/07/17 EF 35-40% laminated apical clot. (Dr. felt ~ 25-30%) and moderate to severe MR.   Cardiac MRI 09/2017 EF 30%, laminated apical thrombus, normal RV size and function, LGE pattern suggests infarction in LAD territory, unlikely to improve with revascularization  S/p Medtronic ICD with Dr Caryl Comes 11/20/17.  He returns today for HF follow up. Overall doing fairly well. Can only walk a few hundred feet due to left hip pain. Uses stationary bike for 2mins qOD. No CP or undue SOB. No problems with meds. No dizziness.   Echo today EF 25-30% RV ok. Small apical thrombus. Personally reviewed   Review of systems complete and found to be negative unless listed in HPI.   [facility] 03/31/2017  Prox RCA lesion is 100% stenosed. Dist RCA lesion is 100% stenosed (retrograde filling from collaterals does not go beyond the distal vessel) -->faint collateral filling to the distal PL and PDA from septal perforators and a diffusely diseased AV groove circumflex  LAV Groove lesion is 90% stenosed.  Prox LAD-1 lesion is 95% stenosed. Prox LAD-2 lesion is 85% stenosed. Heavily calcified,  tandem lesions in very tortuous segment of the vessel.  Prox Cx to Mid Cx lesion is 70% stenosed. Heavily calcified  There is severe left ventricular systolic dysfunction. The left ventricular ejection fraction is less than 25% by visual estimate.  LV end diastolic pressure is severely elevated -prior to Impella insertion  Hemodynamic findings consistent with moderate pulmonary hypertension.  Successful Impella insertion with 3.5-3.6 LPM flow's  Echo 04/01/2017 EF 15-20%  Echo 04/16/17 (post op) Improved to 25-30% on 04/16/17 Echo 08/07/17: EF 35-40%, mod to severe MR  Past Medical History:  Diagnosis Date  . Bladder cancer ([place])   . Borderline glaucoma   . CHF (congestive heart failure) ([place])   . Coronary artery disease   . Diabetes mellitus type 2, controlled ([place]) ORAL MED  . Essential hypertension     Current Outpatient Medications  Medication Sig Dispense Refill  . atorvastatin (LIPITOR) 80 MG tablet Take 80 mg by mouth daily.    . carvedilol (COREG) 3.125 MG tablet TAKE 1 TABLET (3.125 MG TOTAL) BY MOUTH 2 (TWO) TIMES DAILY. 180 tablet 1  . ELIQUIS 5 MG TABS tablet TAKE 1 TABLET TWICE A DAY 180 tablet 3  . eplerenone (INSPRA) 25 MG tablet TAKE 1 TABLET BY MOUTH EVERY DAY 90 tablet 1  . metFORMIN (GLUCOPHAGE-XR) 500 MG 24 hr tablet Take 500 mg by mouth 2 (two) times daily.    . mometasone (NASONEX) 50 MCG/ACT nasal spray Place 2 sprays into the nose daily.     . sacubitril-valsartan (ENTRESTO) 49-51 MG Take  1 tablet by mouth 2 (two) times daily. 180 tablet 3  . SITagliptin Phosphate (JANUVIA PO) Take by mouth.     No current facility-administered medications for this encounter.    Facility-Administered Medications Ordered in Other Encounters  Medication Dose Route Frequency Provider Last Rate Last Dose  . perflutren lipid microspheres (DEFINITY) IV suspension  1-10 mL Intravenous PRN , Shaune Pascal, MD   3 mL at 12/10/18 1412    Allergies  Allergen Reactions  .  Spironolactone Other (See Comments)    Painful gynecomastia      Social History   Socioeconomic History  . Marital status: Married    Spouse name: Not on file  . Number of children: Not on file  . Years of education: Not on file  . Highest education level: Not on file  Occupational History  . Not on file  Social Needs  . Financial resource strain: Not on file  . Food insecurity    Worry: Not on file    Inability: Not on file  . Transportation needs    Medical: Not on file    Non-medical: Not on file  Tobacco Use  . Smoking status: Former Smoker    Packs/day: 1.00    Years: 40.00    Pack years: 40.00    Types: Cigarettes    Quit date: 03/31/2017    Years since quitting: 1.6  . Smokeless tobacco: Never Used  Substance and Sexual Activity  . Alcohol use: No  . Drug use: No  . Sexual activity: Not on file  Lifestyle  . Physical activity    Days per week: 0 days    Minutes per session: 0 min  . Stress: Only a little  Relationships  . Social Herbalist on phone: Not on file    Gets together: Not on file    Attends religious service: Not on file    Active member of club or organization: Not on file    Attends meetings of clubs or organizations: Not on file    Relationship status: Not on file  . Intimate partner violence    Fear of current or ex partner: Not on file    Emotionally abused: Not on file    Physically abused: Not on file    Forced sexual activity: Not on file  Other Topics Concern  . Not on file  Social History Narrative  . Not on file   Family History  Problem Relation Age of Onset  . Heart attack Sister   . Heart disease Sister     Vitals:   12/10/18 1353  BP: 126/68  Pulse: 62  SpO2: 98%  Weight: 95.8 kg (211 lb 3.2 oz)   Wt Readings from Last 3 Encounters:  12/10/18 95.8 kg (211 lb 3.2 oz)  06/11/18 92.1 kg (203 lb)  03/06/18 89.6 kg (197 lb 8 oz)    PHYSICAL EXAM: General:  Well appearing. No resp difficulty HEENT:  normal Neck: supple. no JVD. Carotids 2+ bilat; no bruits. No lymphadenopathy or thryomegaly appreciated. Cor: PMI laterally displaced. Regular rate & rhythm. No rubs, gallops or murmurs. Lungs: clear Abdomen: soft, nontender, nondistended. No hepatosplenomegaly. No bruits or masses. Good bowel sounds. Extremities: no cyanosis, clubbing, rash, edema Neuro: alert & orientedx3, cranial nerves grossly intact. moves all 4 extremities w/o difficulty. Affect pleasant   ASSESSMENT & PLAN:  1. Chronic systolic CHF due to ICM  - EF 04/01/17 EF 15-20% -> Improved to 25-30% on 04/16/17  Post op CABG as below.  - Echo 08/07/17 read as EF 35-40% but Dr Haroldine Laws reviewed personally and EF 25-30% (reviewed with Dr. Meda Coffee who agrees) + laminated apical clot and moderate to severe MR. RV ok.  - Cardiac MRI 09/2017 EF 30%, laminated apical thrombus, normal RV size and function, LGE pattern suggests infarction in LAD territory, unlikely to improve with revascularization - Echo today EF 25-30% RV ok. Mild MR Small apical thrombus. Personally reviewed - S/p Medtronic ICD with Dr Caryl Comes 11/20/17 - Stable NYHA II- early III.   - Volume status stable. Not on lasix. - Continue carvedilol 3.125 bid.  - Continue eplerenone 25 mg daily. (painful gynecomastia with spiro) - Increase Entresto 97/103 mg BID.  - If tolerates Entresto in 2 weeks switch Januvia to Jardiance 10 (watch fluid level)  2. Mitral regurgitation - moderate to severe on echo 07/2017 - mild on echo today  3. CAD - s/p CABG with LIMA to LAD and SVG to OM on 04/08/2017 - No s/s of ischemia - Continue statin, BB. No ASA with Eliquis use.  4. Tobacco abuse: Smoked 1 ppd PTA for > 50 years.  - Remains quit. No change.   5. Hx of bladder CA:  - s/p Multiple rounds of BCG therapy.   6. Paroxysmal Atrial fibrillation - Regular on exam. Off amio. Last episode of AF in November per device interrogation.  - Continue Eliquis. Denies bleeding.   7.  LV clot - Laminated, small - On Eliquis for PAF. No change.   8. DM2 - Switch Januvia to Jardiance as above. .   9. Left hip OA - Refer to Dr. Ninfa Linden for further evaluation and possible injection vs THA.  Total time spent 35 minutes. Over half that time spent discussing above.    Glori Bickers, MD 12/10/18   Patient seen and examined with the above-signed Advanced Practice Provider and/or Housestaff. I personally reviewed laboratory data, imaging studies and relevant notes. I independently examined the patient and formulated the important aspects of the plan. I have edited the note to reflect any of my changes or salient points. I have personally discussed the plan with the patient and/or family.  He is doing very well. NYHA II. Volume status looks good. No ischemic symptoms. Remains in NSR today on exam and ECG. WIll see back in 3 months with echo.   Glori Bickers, MD  2:34 PM

## 2018-12-10 NOTE — Patient Instructions (Addendum)
Labs were done today. We will ONLY notify you if there are abnormalities.   Entresto was increased to 97/103mg . 1 tablet two times a day.  You have been referred to Dr. Zollie Beckers, with Ortho for your right hip pain. They will call you to schedule an appointment.  If you are tolerating Entresto 97/103mg  twice a day, in 2 weeks STOP Januvia and START Jardiance 10mg  1 tablet daily. Please call office in 2 weeks to let us know how you are doing.  Our office will call you for a 6 month follow up.   At the [facility], you and your health needs are our priority. As part of our continuing mission to provide you with exceptional heart care, we have created designated Provider Care Teams. These Care Teams include your primary Cardiologist (physician) and Advanced Practice Providers (APPs- Physician Assistants and Nurse Practitioners) who all work together to provide you with the care you need, when you need it.   You may see any of the following providers on your designated Care Team at your next follow up: [name] Dr Glori Bickers . Dr Loralie Champagne . Darrick Grinder, NP   Please be sure to bring in all your medications bottles to every appointment.

## 2018-12-10 NOTE — Progress Notes (Signed)
  Echocardiogram 2D Echocardiogram has been performed.  [name] [name] 12/10/2018, 2:12 PM

## 2018-12-11 ENCOUNTER — Telehealth (HOSPITAL_COMMUNITY): Payer: Self-pay

## 2018-12-11 NOTE — Telephone Encounter (Signed)
Pt made aware of results. Advised to monitor and limit foods high in K. Pt will repeat blood work in 2 weeks. appt card mailed. Verbalized understanding.

## 2018-12-11 NOTE — Telephone Encounter (Signed)
-----   Message from Jolaine Artist, MD sent at 12/10/2018 11:54 PM EDT ----- k up slightly. Repeat in 2 weeks

## 2018-12-15 ENCOUNTER — Ambulatory Visit (INDEPENDENT_AMBULATORY_CARE_PROVIDER_SITE_OTHER): Payer: Medicare Other

## 2018-12-15 ENCOUNTER — Ambulatory Visit (INDEPENDENT_AMBULATORY_CARE_PROVIDER_SITE_OTHER): Payer: Medicare Other | Admitting: Orthopaedic Surgery

## 2018-12-15 ENCOUNTER — Encounter: Payer: Self-pay | Admitting: Orthopaedic Surgery

## 2018-12-15 DIAGNOSIS — M25551 Pain in right hip: Secondary | ICD-10-CM

## 2018-12-15 DIAGNOSIS — I255 Ischemic cardiomyopathy: Secondary | ICD-10-CM | POA: Diagnosis not present

## 2018-12-15 NOTE — Progress Notes (Signed)
Subjective: Patient is here for ultrasound-guided intra-articular right hip injection.   Mostly posterior pain, especially with lateral movements.  Objective: Good range of motion of the hip, no tenderness to palpation.  Procedure: Ultrasound-guided right hip injection: After sterile prep with Betadine, injected 8 cc 1% lidocaine without epinephrine and 40 mg methylprednisolone using a 22-gauge spinal needle, passing the needle through the iliofemoral ligament into the femoral head/neck junction.  Injectate was seen filling the joint capsule.  He actually noticed good relief of pain during the anesthetic phase.

## 2018-12-15 NOTE — Progress Notes (Signed)
Office Visit Note   Patient: [name] [name]           Date of Birth: 08/03/1945           MRN: [id number] Visit Date: 12/15/2018              Requested by: Jolaine Artist, MD [address] [place],  [place] [address] PCP: Christain Sacramento, MD   Assessment & Plan: Visit Diagnoses:  1. Pain in right hip     Plan: I spoke with him in detail about his hip.  I think it is absolutely worthwhile trying an intra-articular steroid injection under ultrasound into that right hip with a steroid.  I explained the rationale behind this as well as the risk and benefits involved.  I am going to refer him to my partner as a consultation today for this injection.  This would be with Dr. Junius Roads.  I would then like to see him back in 4 weeks.  All question concerns were answered and addressed.  Follow-Up Instructions: Return in about 4 weeks (around 01/12/2019).   Orders:  Orders Placed This Encounter  Procedures  . XR HIP UNILAT W OR W/O PELVIS 1V RIGHT   No orders of the defined types were placed in this encounter.     Procedures: No procedures performed   Clinical Data: No additional findings.   Subjective: Chief Complaint  Patient presents with  . Right Hip - Pain  The patient is a very pleasant 73 year old gentleman who comes in for evaluation treatment of right hip pain.  Some of this seems to be involving the right hip and some of it seems to be evolving the right lower back area.  He is someone who is very active and says it only hurts with weightbearing activities.  He said he used to go walk long distances but now his distance is cut in half to the pain he is having around his right hip area.  He otherwise tries to stay as healthy as possible.  He denies any injuries to this hip.  He denies any numbness and tingling going down his leg on the right side.  He denies any left hip pain.  He denies any weakness in his legs.  HPI  Review of Systems He currently  denies any headache, chest pain, shortness of breath, fever, chills, nausea, vomiting  Objective: Vital Signs: There were no vitals taken for this visit.  Physical Exam  Ortho Exam Examination of his left hip is normal examination of his right hip is also normal in terms of full range of motion but he does have some pain at the extremes of rotation.  It is difficult to tell this truly around his hip versus the trochanteric area Specialty Comments:  No specialty comments available.  Imaging: Xr Hip Unilat W Or W/o Pelvis 1v Right  Result Date: 12/15/2018 An AP pelvis and lateral of the right hip does show slight lateral joint space narrowing with a paratubular osteophyte off of the lateral edge of the acromion.  The remainder of the hip appears normal in terms of the joint space.    PMFS History: Patient Active Problem List   Diagnosis Date Noted  . Ischemic cardiomyopathy 11/20/2017  . CAD (coronary artery disease) 05/10/2017  . Chronic combined systolic and diastolic CHF (congestive heart failure) ([place]) 05/10/2017  . CKD (chronic kidney disease), stage III ([place]) 05/10/2017  . Acute epididymitis 05/10/2017  . Left epididymitis   .  Orchitis, right   . 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 ST elevation myocardial infarction (STEMI) of anterior wall ([place]) 03/31/2017  . STEMI (ST elevation myocardial infarction) ([place]) 03/31/2017  . Essential hypertension   . Diabetes mellitus type 2, controlled ([place])    Past Medical History:  Diagnosis Date  . Bladder cancer ([place])   . Borderline glaucoma   . CHF (congestive heart failure) ([place])   . Coronary artery disease   . Diabetes mellitus type 2, controlled ([place]) ORAL MED  . Essential hypertension     Family History  Problem Relation Age of Onset  . Heart attack Sister   . Heart disease Sister     Past Surgical History:  Procedure Laterality Date  . CAROTID DUPLEX SCAN  07-26-2010   BILATERAL ICA  STENOSIS 1% - 39%  . CORONARY ARTERY BYPASS  GRAFT N/A 04/08/2017   Procedure: CORONARY ARTERY BYPASS GRAFTING (CABG) TIMES TWO USING LEFT INTERNAL MAMMARY ARTERY AND LEFT SAPHENOUS LEG VEIN HARVESTED ENDOSCOPICALLY.  LEG VEIN ALSO HARVESTED FROM THE RIGHT LEG;  Surgeon: [name], MD;  Location: [facility];  Service: Open Heart Surgery;  Laterality: N/A;  . CYSTOSCOPY WITH BIOPSY N/A 08/01/2012   Procedure: CYSTOSCOPY WITH BIOPSY BLADDER BIOPSY   ;  Surgeon: Fredricka Bonine, MD;  Location[facility]  Service: Urology;  Laterality: N/A;  . FULGURATION OF BLADDER TUMOR N/A 08/01/2012   Procedure: FULGURATION OF BLADDER TUMOR;  Surgeon: Fredricka Bonine, MD;  Location: [facility];  Service: Urology;  Laterality: N/A;  . ICD IMPLANT  11/20/2017  . ICD IMPLANT N/A 11/20/2017   Procedure: ICD IMPLANT;  Surgeon: Deboraha Sprang, MD;  Location: [place];  Service: Cardiovascular;  Laterality: N/A;  . LEFT HEART CATH AND CORONARY ANGIOGRAPHY N/A 03/31/2017   Procedure: LEFT HEART CATH AND CORONARY ANGIOGRAPHY;  Surgeon: Leonie Man, MD;  Location: [place] [facility];  Service: Cardiovascular;  Laterality: N/A;  . RIGHT HEART CATH Right 03/31/2017   Procedure: RIGHT HEART CATH;  Surgeon: Leonie Man, MD;  Location: [place] [facility];  Service: Cardiovascular;  Laterality: Right;  . TEE WITHOUT CARDIOVERSION N/A 04/08/2017   Procedure: TRANSESOPHAGEAL ECHOCARDIOGRAM (TEE);  Surgeon: [name], MD;  Location: [facility];  Service: Open Heart Surgery;  Laterality: N/A;  . TRANSURETHRAL RESECTION OF BLADDER TUMOR  05/29/2011   Procedure: TRANSURETHRAL RESECTION OF BLADDER TUMOR (TURBT);  Surgeon: Fredricka Bonine, MD;  Location: [facility];  Service: Urology;  Laterality: N/A;  . VENTRICULAR ASSIST DEVICE INSERTION Right 03/31/2017   Procedure: VENTRICULAR ASSIST DEVICE INSERTION;  Surgeon: Leonie Man, MD;  Location: [place] [facility];  Service: Cardiovascular;   Laterality: Right;   Social History   Occupational History  . Not on file  Tobacco Use  . Smoking status: Former Smoker    Packs/day: 1.00    Years: 40.00    Pack years: 40.00    Types: Cigarettes    Quit date: 03/31/2017    Years since quitting: 1.7  . Smokeless tobacco: Never Used  Substance and Sexual Activity  . Alcohol use: No  . Drug use: No  . Sexual activity: Not on file

## 2018-12-22 IMAGING — DX DG CHEST 1V PORT
1 series · 2 of 2 positions shown · non-contrast
Comparison: Chest radiograph dated 03/31/2017

CLINICAL DATA: 71-year-old male with central line placement.

EXAM:
PORTABLE CHEST 1 VIEW

[Series 1: chest · 0.14mm/px · 2 of 2 slices shown]
[im 1/2]
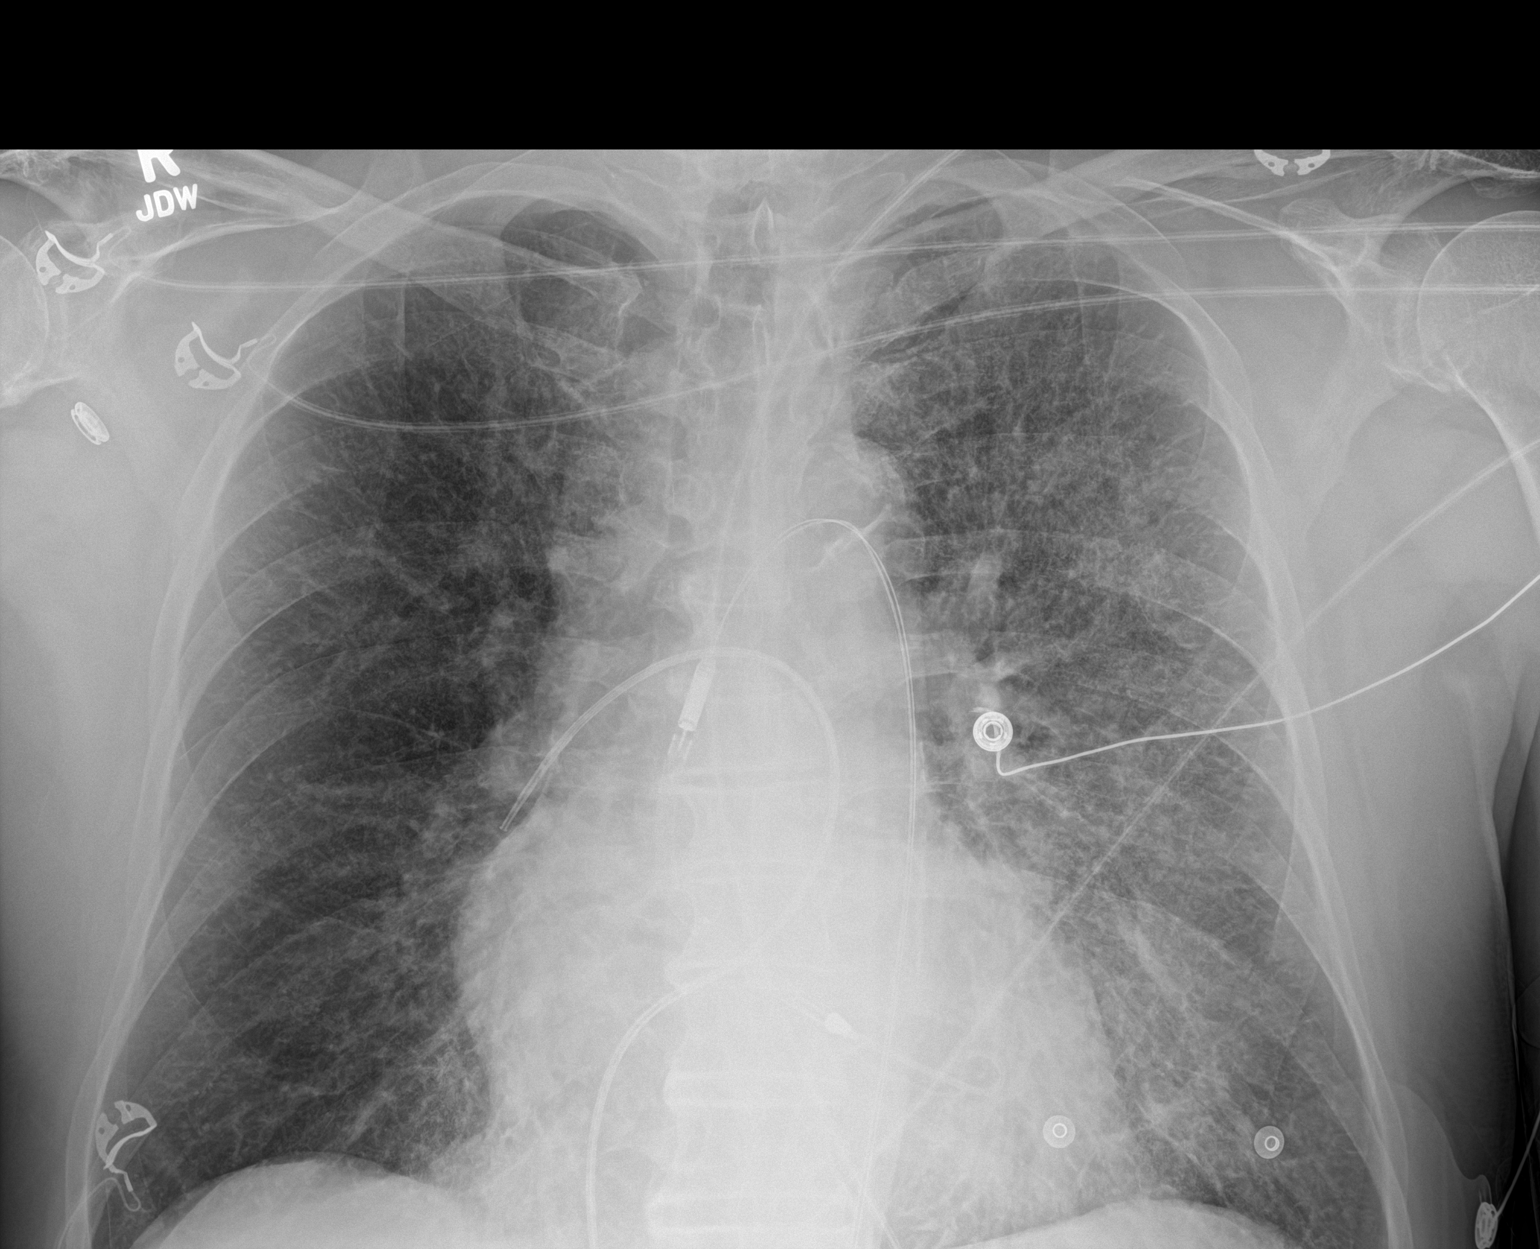
[im 2/2]
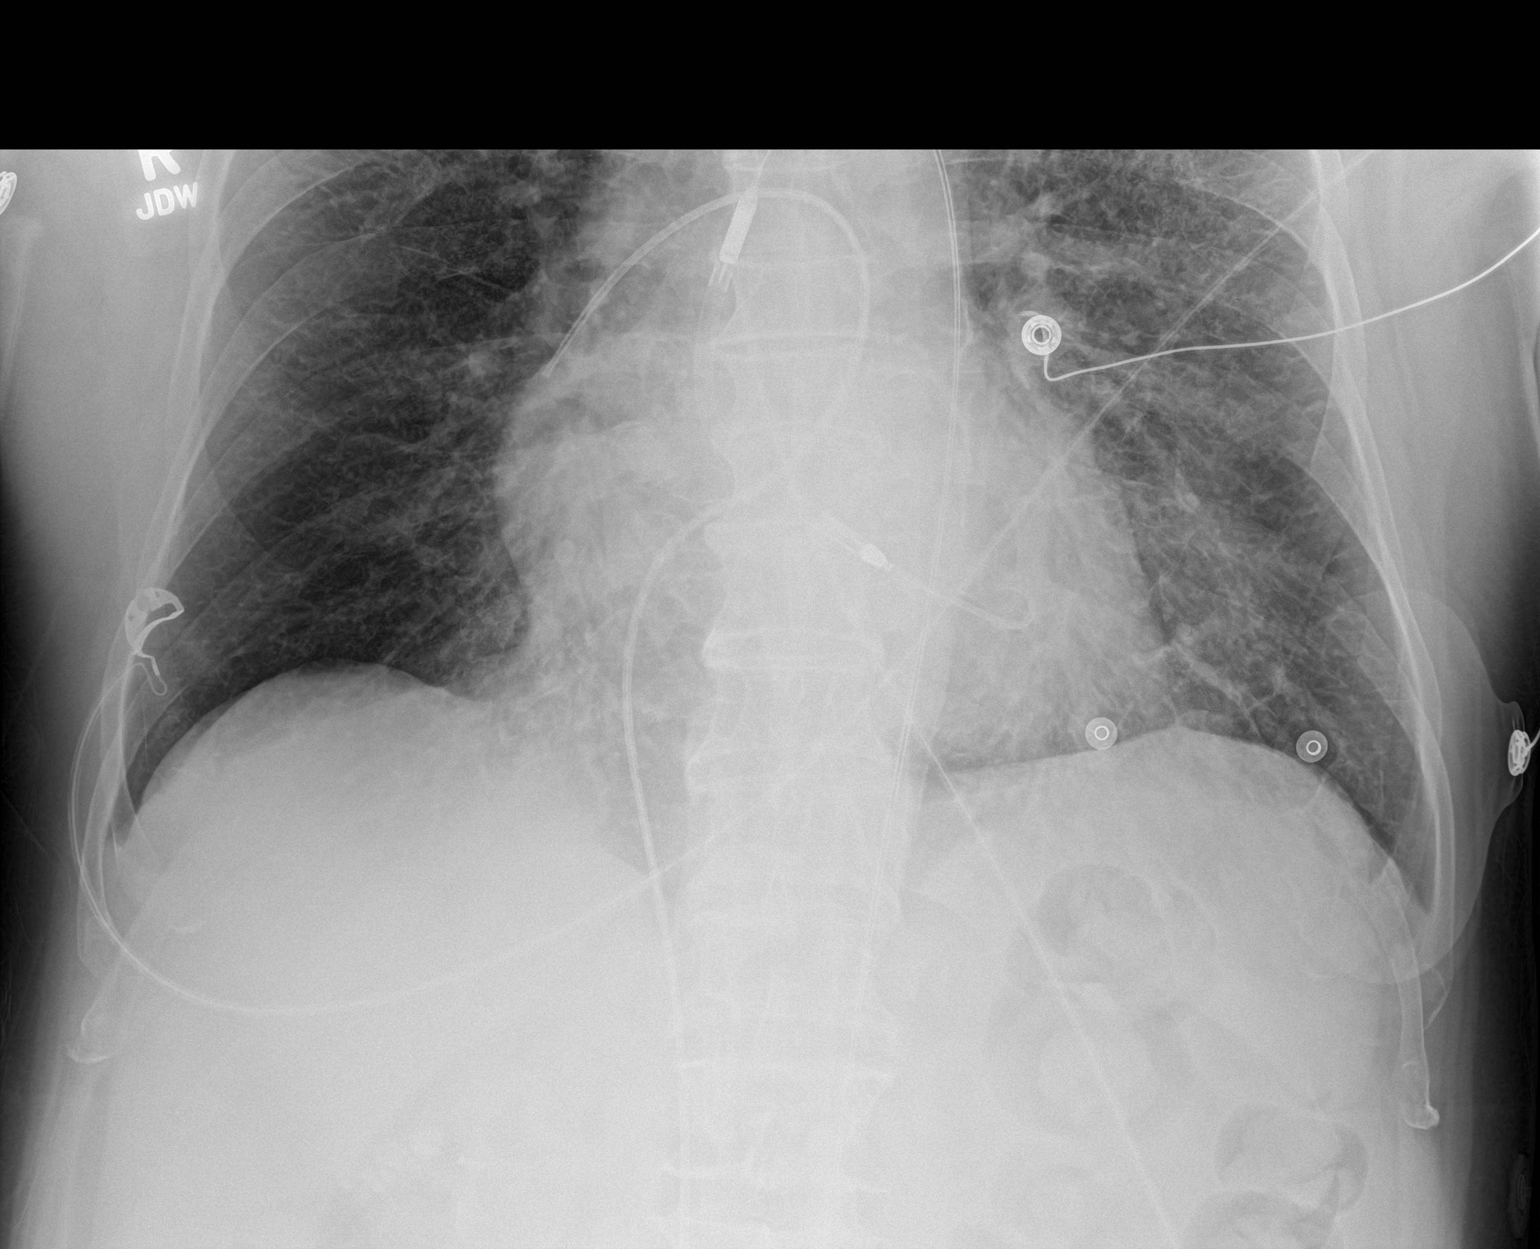

[2 of 2 positions shown; findings below may reference images not displayed]

FINDINGS: An inferior approach Thij with tip in the region of the right
pulmonary artery. An Impella device is seen with the pigtail tip
over the left ventricle. There is atherosclerotic calcification of
the thoracic aorta. There is diffuse interstitial prominence
primarily involving the left lung with Kerley B-lines consistent
with interstitial edema. There is no focal consolidation, pleural
effusion, or pneumothorax. No acute osseous pathology.
IMPRESSION: 1. Interval placement of an inferior approach Swan-Ganz catheter and
an Impella device.
2. Increased interstitial prominence consistent with edema.

## 2018-12-22 IMAGING — DX DG CHEST 1V PORT
1 series · 1 of 1 positions shown · non-contrast
Comparison: None.

CLINICAL DATA: Chest pain.

EXAM:
PORTABLE CHEST 1 VIEW

[chest]
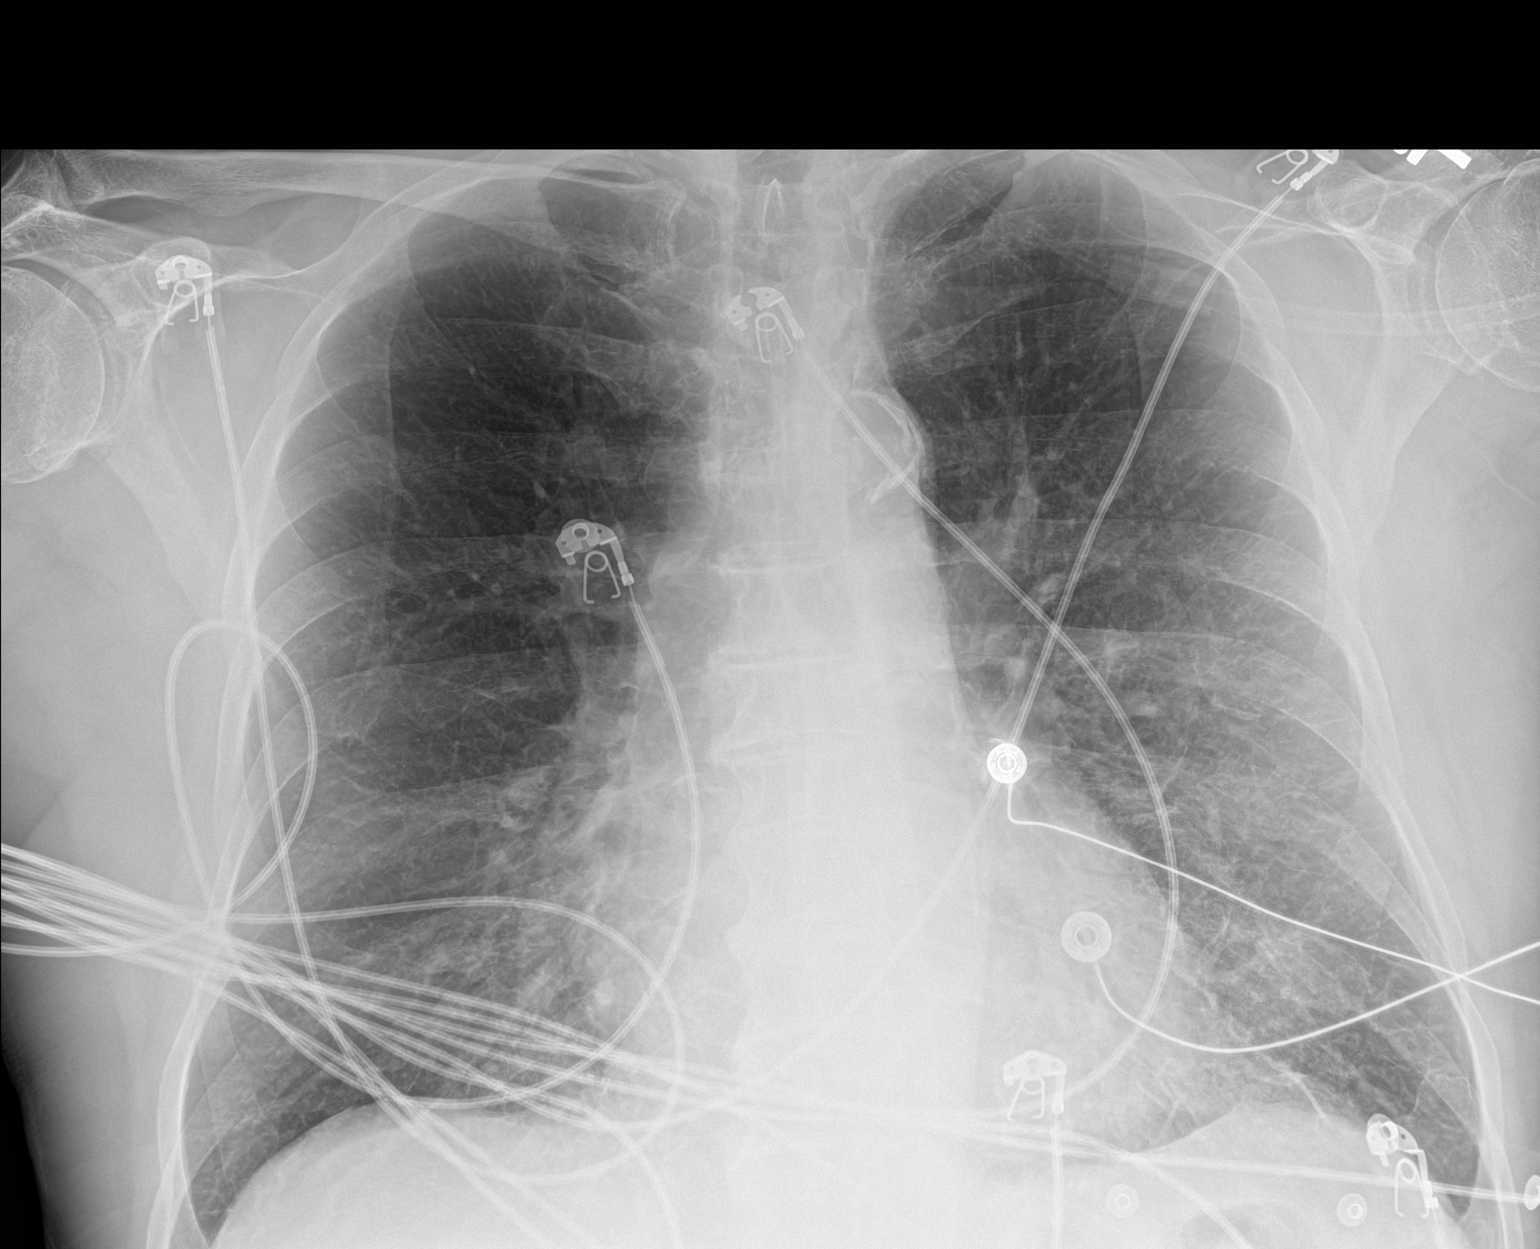

[1 of 1 positions shown; findings below may reference images not displayed]

FINDINGS: Cardiomediastinal silhouette is normal. Mediastinal contours appear
intact. Heavy calcific atherosclerotic disease of the aorta.

There is no evidence of focal airspace consolidation, pleural
effusion or pneumothorax.

Osseous structures are without acute abnormality. Soft tissues are
grossly normal.
IMPRESSION: Calcific atherosclerotic disease of the aorta.

No evidence of pulmonary edema or airspace consolidation.

## 2018-12-23 IMAGING — DX DG CHEST 1V PORT
1 series · 1 of 1 positions shown · non-contrast
Comparison: Portable exam 4412 hours compared to 03/31/2017

CLINICAL DATA: Fever to 102 degrees, bladder cancer, type II
diabetes mellitus, hypertension

EXAM:
PORTABLE CHEST 1 VIEW

[chest]
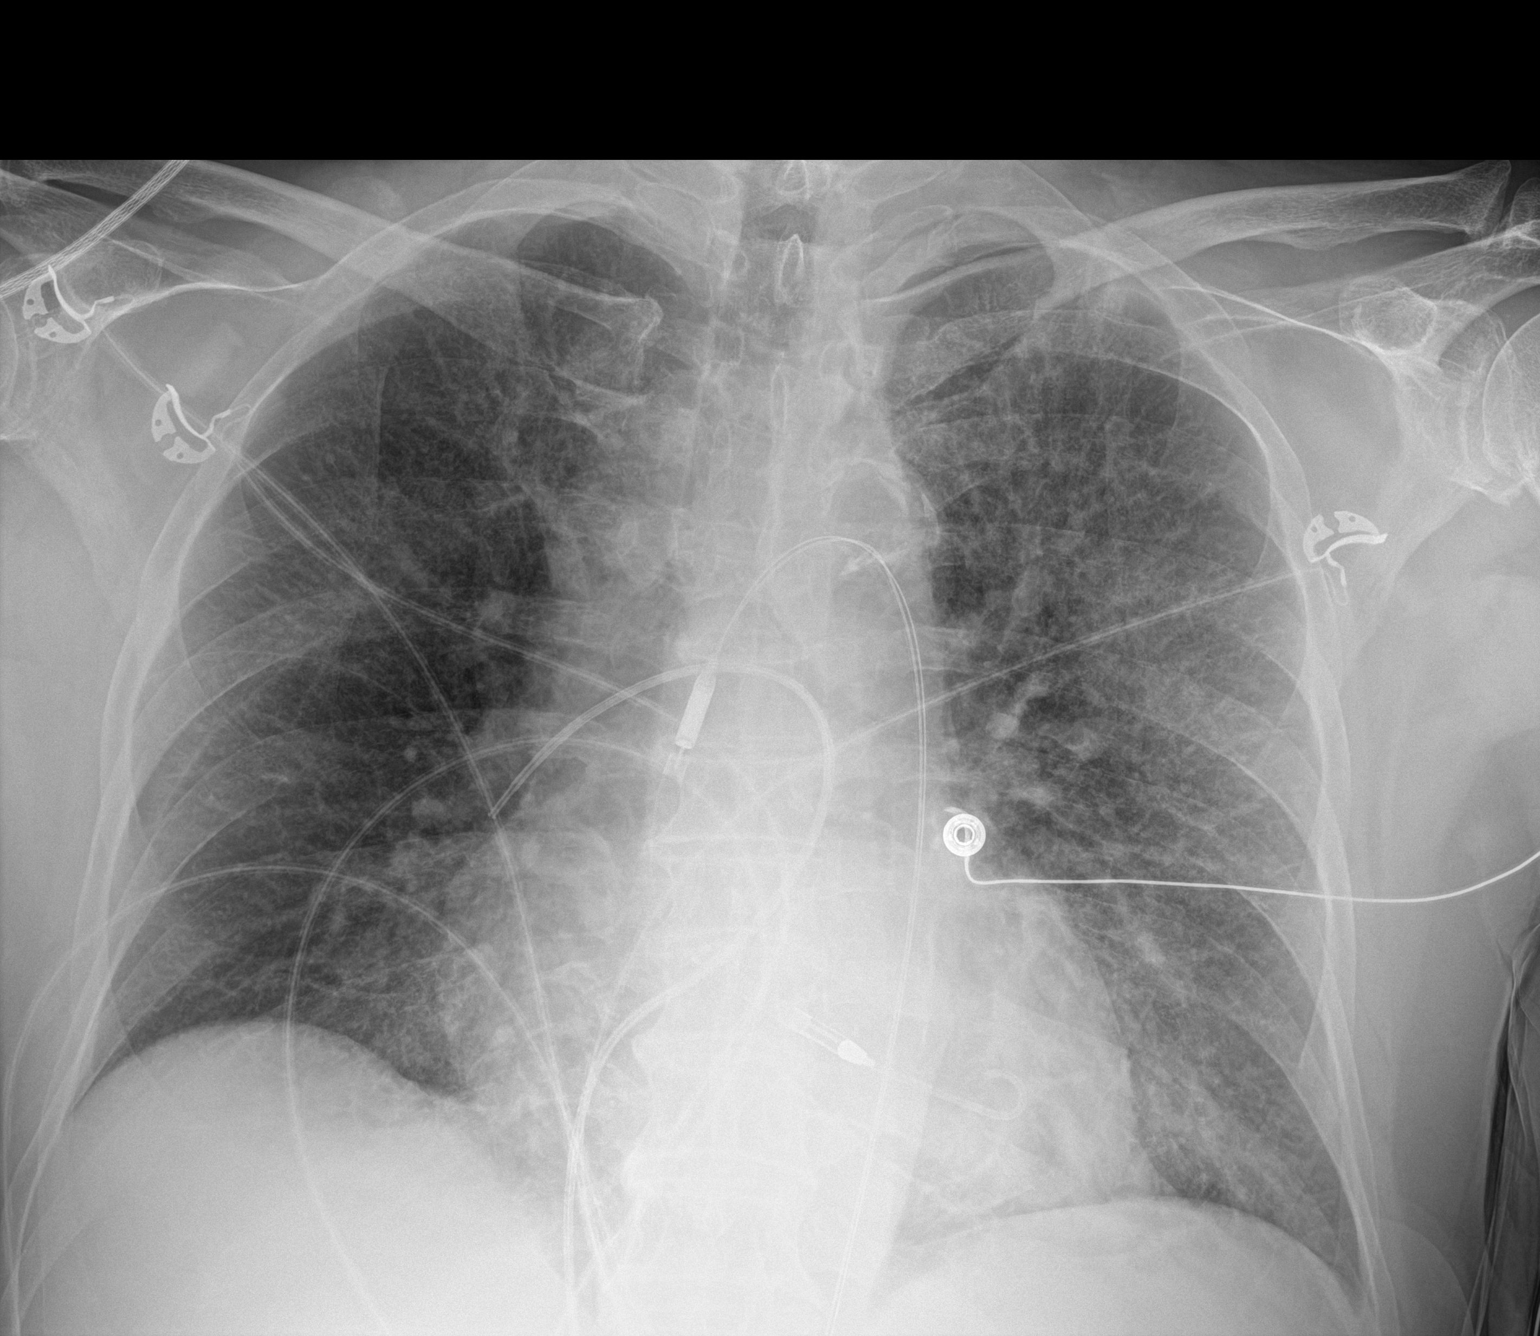

[1 of 1 positions shown; findings below may reference images not displayed]

FINDINGS: Impella device in LEFT ventricle and aorta.

Enlargement of cardiac silhouette with pulmonary vascular
congestion.

Asymmetric pulmonary interstitial infiltrates LEFT greater than
RIGHT favoring asymmetric edema, unchanged.

Interval removal of Swan-Ganz catheter.

Atherosclerotic calcification aorta.

No pleural effusion or pneumothorax.
IMPRESSION: Persistent probable asymmetric pulmonary edema.

## 2018-12-24 ENCOUNTER — Other Ambulatory Visit (HOSPITAL_COMMUNITY): Payer: Self-pay

## 2018-12-24 ENCOUNTER — Other Ambulatory Visit: Payer: Self-pay

## 2018-12-24 ENCOUNTER — Ambulatory Visit (HOSPITAL_COMMUNITY)
Admission: RE | Admit: 2018-12-24 | Discharge: 2018-12-24 | Disposition: A | Discharge status: Home / Self Care | Payer: Medicare Other | Source: Ambulatory Visit | Attending: Cardiology | Admitting: Cardiology

## 2018-12-24 DIAGNOSIS — I5042 Chronic combined systolic (congestive) and diastolic (congestive) heart failure: Secondary | ICD-10-CM

## 2018-12-24 LAB — BASIC METABOLIC PANEL
Anion gap: 8 (ref 5–15)
BUN: 25 mg/dL — ABNORMAL HIGH (ref 8–23)
CO2: 25 mmol/L (ref 22–32)
Calcium: 9.3 mg/dL (ref 8.9–10.3)
Chloride: 105 mmol/L (ref 98–111)
Creatinine, Ser: 1.41 mg/dL — ABNORMAL HIGH (ref 0.61–1.24)
GFR calc Af Amer: 57 mL/min — ABNORMAL LOW (ref >60)
GFR calc non Af Amer: 49 mL/min — ABNORMAL LOW (ref >60)
Glucose, Bld: 126 mg/dL — ABNORMAL HIGH (ref 70–99)
Potassium: 4.9 mmol/L (ref 3.5–5.1)
Sodium: 138 mmol/L (ref 135–145)

## 2018-12-25 IMAGING — DX DG CHEST 1V PORT
1 series · 2 of 2 positions shown · non-contrast
Comparison: 04/02/2017

CLINICAL DATA: Respiratory failure

EXAM:
PORTABLE CHEST 1 VIEW

[Series 1: chest ap · 0.14mm/px · 2 of 2 slices shown]
[im 1/2]
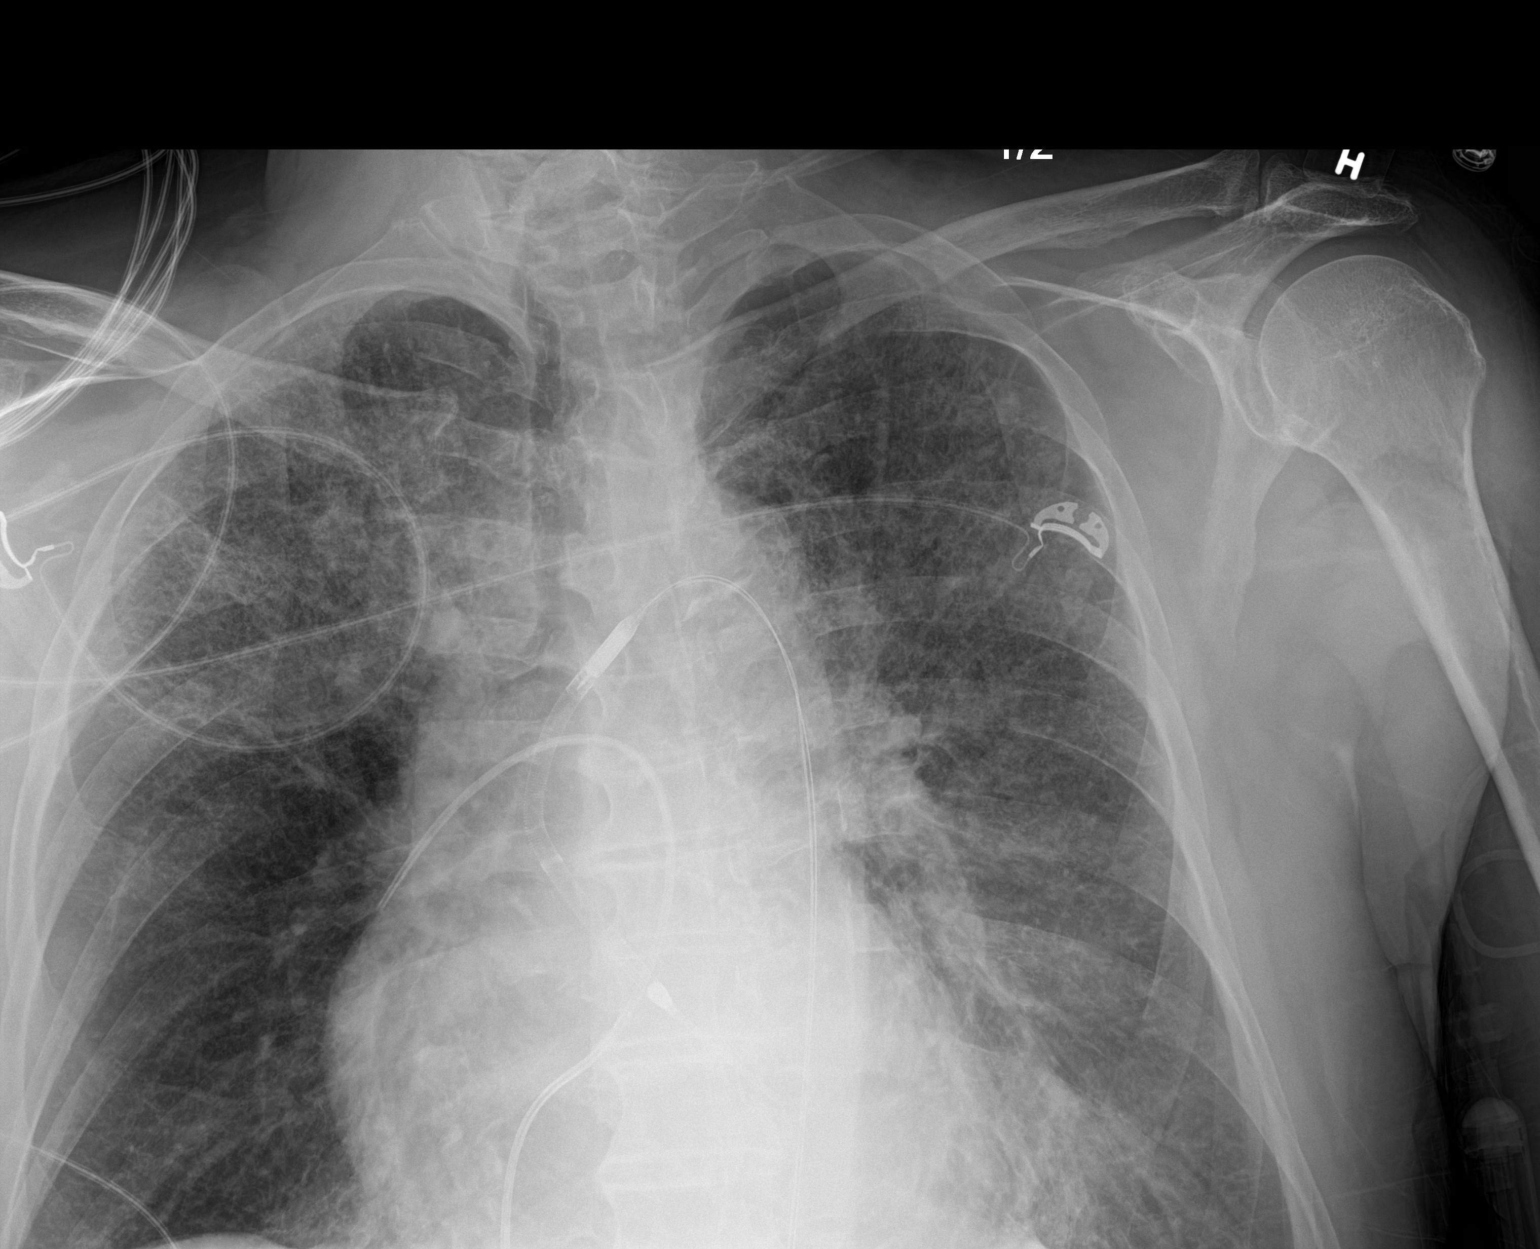
[im 2/2]
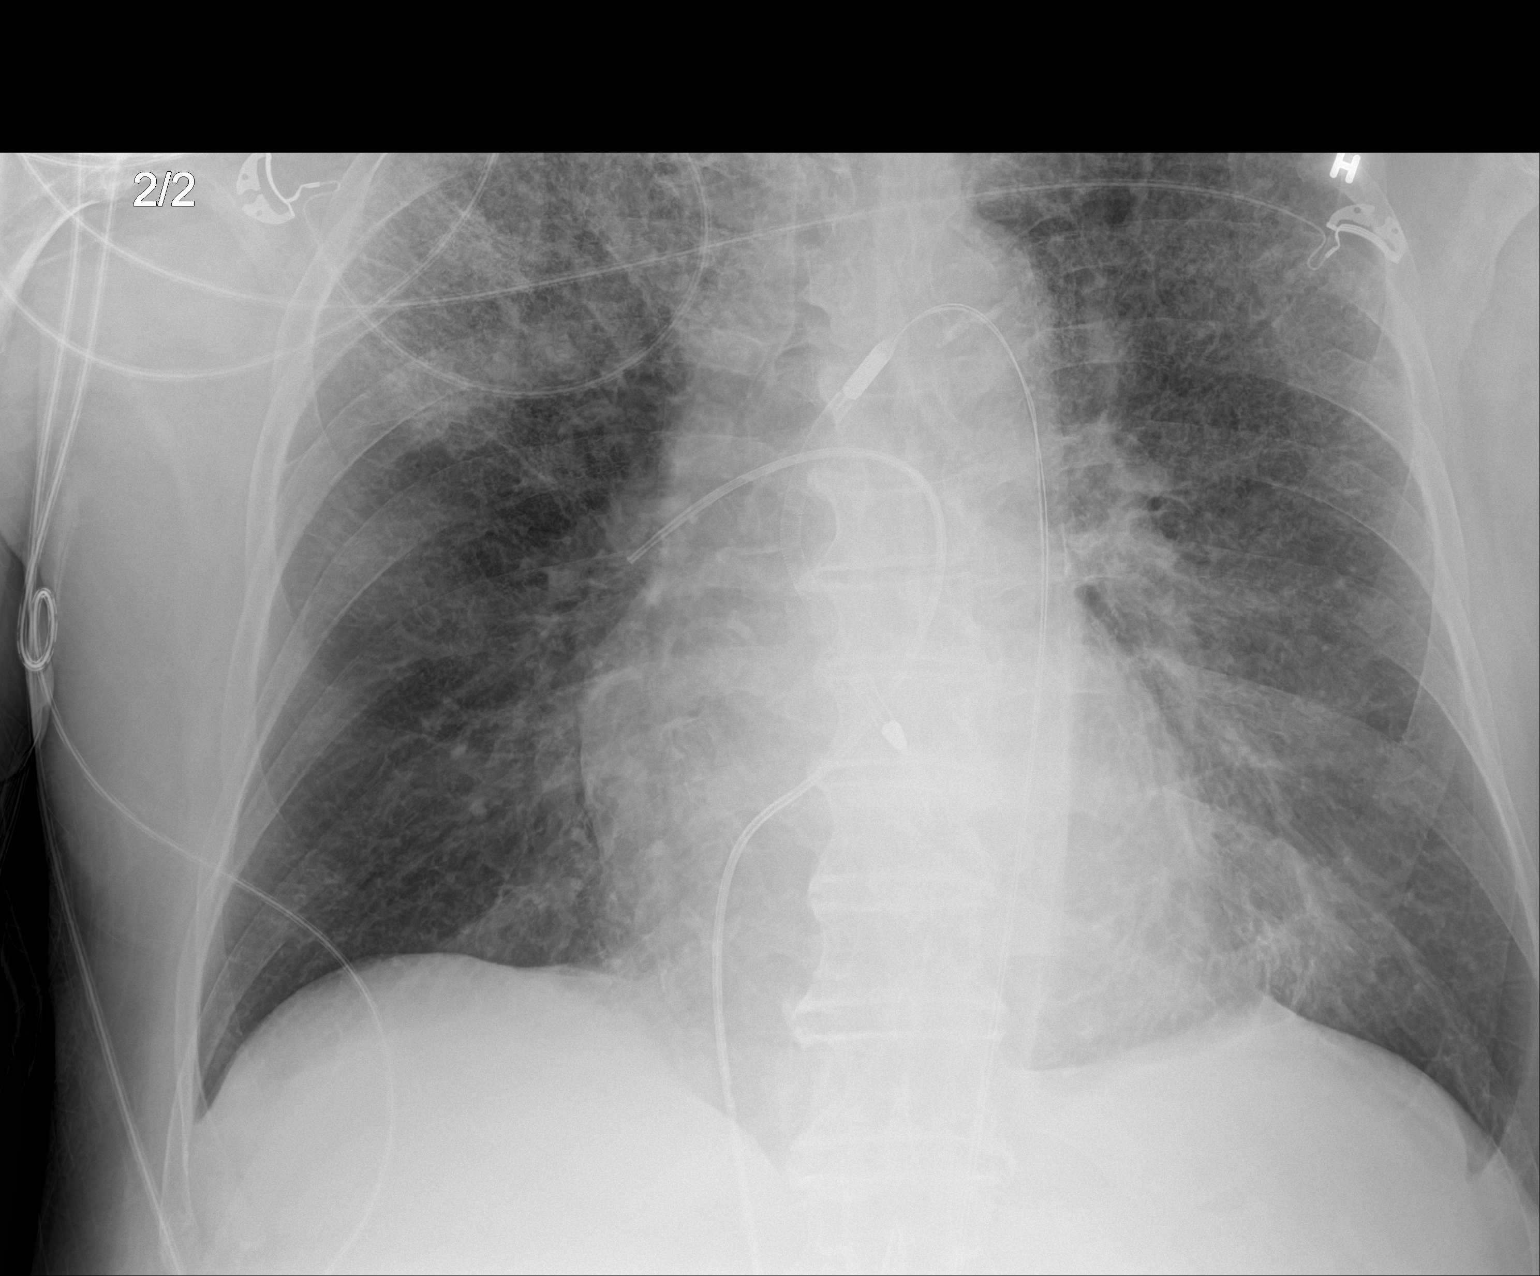

[2 of 2 positions shown; findings below may reference images not displayed]

FINDINGS: Cardiac shadow is mildly enlarged but stable. And Impella catheter
is again identified and stable. Swan-Ganz catheter from a femoral
approach is also noted. Diffuse interstitial changes are again
identified throughout the left lung and the right upper lobe. These
are stable in appearance from the prior study. No new focal
abnormality is seen. No bony abnormality is noted.
IMPRESSION: Stable interstitial changes bilaterally.

Tubes and lines as described stable in appearance.

## 2018-12-26 ENCOUNTER — Encounter: Payer: Self-pay | Admitting: Orthopaedic Surgery

## 2019-01-02 ENCOUNTER — Other Ambulatory Visit: Payer: Self-pay

## 2019-01-02 ENCOUNTER — Encounter: Payer: Self-pay | Admitting: Internal Medicine

## 2019-01-02 ENCOUNTER — Ambulatory Visit (INDEPENDENT_AMBULATORY_CARE_PROVIDER_SITE_OTHER): Payer: Medicare Other | Admitting: Internal Medicine

## 2019-01-02 VITALS — BP 106/68 | HR 51 | Ht 74.0 in | Wt 210.0 lb

## 2019-01-02 DIAGNOSIS — I255 Ischemic cardiomyopathy: Secondary | ICD-10-CM

## 2019-01-02 DIAGNOSIS — I5042 Chronic combined systolic (congestive) and diastolic (congestive) heart failure: Secondary | ICD-10-CM | POA: Diagnosis not present

## 2019-01-02 DIAGNOSIS — Z951 Presence of aortocoronary bypass graft: Secondary | ICD-10-CM

## 2019-01-02 DIAGNOSIS — I480 Paroxysmal atrial fibrillation: Secondary | ICD-10-CM

## 2019-01-02 DIAGNOSIS — Z95810 Presence of automatic (implantable) cardiac defibrillator: Secondary | ICD-10-CM | POA: Diagnosis not present

## 2019-01-02 NOTE — Progress Notes (Signed)
Patient Care Team: Christain Sacramento, MD as PCP - General (Family Medicine)   HPI  [name] [name] is a 73 y.o. male seen in followup for ICD implanted for primary prevention 7/19.  Has Ischemic cardiomyopathy having presented 12/18 with anterior MI and shock requiring impella support.  Further complicated by sepsis.  S/P CABG with postop afib     He was found to have a laminated LV thrombus.  He has been treated with apixaban.  Exercise tolerance is limited mostly by his hip.  Since the recent up titration of his Entresto and addition of empagliflozin he has noted no shortness of breath.  No chest pain.  No lightheadedness.  No edema.    DATE TEST EF   12/18 LHC  25 % RCA T; LAD 95; Cx 70%  4/19 Echo  25-30% Mod-sev MR  6/19 cMRI 30%   8/20 Echo  25-30%    Date Cr K Hgb  7/19 1.18 4.7 12.5  8/20  1.41 4.9<<5.3 15.7      Records and Results Reviewed   Past Medical History:  Diagnosis Date  . Bladder cancer ([place])   . Borderline glaucoma   . CHF (congestive heart failure) ([place])   . Coronary artery disease   . Diabetes mellitus type 2, controlled ([place]) ORAL MED  . Essential hypertension     Past Surgical History:  Procedure Laterality Date  . CAROTID DUPLEX SCAN  07-26-2010   BILATERAL ICA  STENOSIS 1% - 39%  . CORONARY ARTERY BYPASS GRAFT N/A 04/08/2017   Procedure: CORONARY ARTERY BYPASS GRAFTING (CABG) TIMES TWO USING LEFT INTERNAL MAMMARY ARTERY AND LEFT SAPHENOUS LEG VEIN HARVESTED ENDOSCOPICALLY.  LEG VEIN ALSO HARVESTED FROM THE RIGHT LEG;  Surgeon: [name], MD;  Location: [facility];  Service: Open Heart Surgery;  Laterality: N/A;  . CYSTOSCOPY WITH BIOPSY N/A 08/01/2012   Procedure: CYSTOSCOPY WITH BIOPSY BLADDER BIOPSY   ;  Surgeon: Fredricka Bonine, MD;  Location: [facility];  Service: Urology;  Laterality: N/A;  . FULGURATION OF BLADDER TUMOR N/A 08/01/2012   Procedure: FULGURATION OF BLADDER TUMOR;  Surgeon: Fredricka Bonine, MD;  Location: [facility];  Service: Urology;  Laterality: N/A;  . ICD IMPLANT  11/20/2017  . ICD IMPLANT N/A 11/20/2017   Procedure: ICD IMPLANT;  Surgeon: Deboraha Sprang, MD;  Location: [place] [facility];  Service: Cardiovascular;  Laterality: N/A;  . LEFT HEART CATH AND CORONARY ANGIOGRAPHY N/A 03/31/2017   Procedure: LEFT HEART CATH AND CORONARY ANGIOGRAPHY;  Surgeon: Leonie Man, MD;  Location: [place] [facility];  Service: Cardiovascular;  Laterality: N/A;  . RIGHT HEART CATH Right 03/31/2017   Procedure: RIGHT HEART CATH;  Surgeon: Leonie Man, MD;  Location: [place] [facility];  Service: Cardiovascular;  Laterality: Right;  . TEE WITHOUT CARDIOVERSION N/A 04/08/2017   Procedure: TRANSESOPHAGEAL ECHOCARDIOGRAM (TEE);  Surgeon: [name], MD;  Location: [place];  Service: Open Heart Surgery;  Laterality: N/A;  . TRANSURETHRAL RESECTION OF BLADDER TUMOR  05/29/2011   Procedure: TRANSURETHRAL RESECTION OF BLADDER TUMOR (TURBT);  Surgeon: Fredricka Bonine, MD;  Location: [facility];  Service: Urology;  Laterality: N/A;  . VENTRICULAR ASSIST DEVICE INSERTION Right 03/31/2017   Procedure: VENTRICULAR ASSIST DEVICE INSERTION;  Surgeon: Leonie Man, MD;  Location: [place] [facility];  Service: Cardiovascular;  Laterality: Right;    Current Meds  Medication Sig  .  atorvastatin (LIPITOR) 80 MG tablet Take 80 mg by mouth daily.  . carvedilol (COREG) 3.125 MG tablet TAKE 1 TABLET (3.125 MG TOTAL) BY MOUTH 2 (TWO) TIMES DAILY.  [name] ELIQUIS 5 MG TABS tablet TAKE 1 TABLET TWICE A DAY  . empagliflozin (JARDIANCE) 10 MG TABS tablet Take 10 mg by mouth daily.  [name] eplerenone (INSPRA) 25 MG tablet TAKE 1 TABLET BY MOUTH EVERY DAY  . metFORMIN (GLUCOPHAGE-XR) 500 MG 24 hr tablet Take 1 tablet by mouth daily.  . mometasone (NASONEX) 50 MCG/ACT nasal spray Place 2 sprays into the nose daily.   . sacubitril-valsartan (ENTRESTO) 97-103 MG Take 1  tablet by mouth 2 (two) times daily.    Allergies  Allergen Reactions  . Spironolactone Other (See Comments)    Painful gynecomastia      Review of Systems negative except from HPI and PMH  Physical Exam BP 106/68   Pulse (!) 51   Ht 6\' 2"  (1.88 m)   Wt 210 lb (95.3 kg)   SpO2 96%   BMI 26.96 kg/m  Well developed and well nourished in no acute distress HENT normal Neck supple with JVP-5-7 Clear Device pocket well healed; without hematoma or erythema.  There is no tethering  Regular rate and rhythm, no  gallop No  murmur Abd-soft with active BS No Clubbing cyanosis  edema Skin-warm and dry A & Oriented  Grossly normal sensory and motor function  ECG sinus at 51 Intervals 19/10/48 Poor R wave progression V1-IV 6 T wave inversions V2-V6 1, L Low voltage limb leads    Assessment and  Plan Ischemic cardiomyopathy status post CABG  Congestive heart failure-class II  Implantable defibrillator-single-chamber-Medtronic  Atrial fibrillation-paroxysmal postoperative  Laminated clot (still present MRI)  Sinus bradycardia  Patient is having more dyspnea in the absence of volume overload.  I wonder whether related to chronotropic incompetence.  Heart rate excursion is blunted   Spoke with Dr. Reine Just.  With flat intrathoracic impedance, he is disinclined towards a diuretic trial.  We will asked him to measure his heart rate with walking using his pulse ox.  He is to follow-up with Dr. Reine Just next week if his breathlessness persist.  He does note that he put on about 10 pounds in the last few months.  This largely related to lack of exercise.    Virl Axe

## 2019-01-12 ENCOUNTER — Encounter: Payer: Self-pay | Admitting: Orthopaedic Surgery

## 2019-01-12 ENCOUNTER — Other Ambulatory Visit: Payer: Self-pay

## 2019-01-12 ENCOUNTER — Ambulatory Visit (INDEPENDENT_AMBULATORY_CARE_PROVIDER_SITE_OTHER): Payer: Medicare Other | Admitting: Orthopaedic Surgery

## 2019-01-12 DIAGNOSIS — I255 Ischemic cardiomyopathy: Secondary | ICD-10-CM

## 2019-01-12 DIAGNOSIS — M5441 Lumbago with sciatica, right side: Secondary | ICD-10-CM | POA: Diagnosis not present

## 2019-01-12 DIAGNOSIS — M25551 Pain in right hip: Secondary | ICD-10-CM | POA: Diagnosis not present

## 2019-01-12 DIAGNOSIS — G8929 Other chronic pain: Secondary | ICD-10-CM

## 2019-01-12 NOTE — Progress Notes (Signed)
The patient comes in today in follow-up after having an intra-articular steroid injection under ultrasound into his right hip.  I felt like there was only just slight joint space narrowing and based on his clinical exam we felt that a steroid injection in the hip joint with helped him.  He said that really did not have a lot of effect on the pain that he is having.  It seems today that the pain is definitely pinpoint able at the lower right side of his lumbar spine and pelvis area.  Based on exam today I feel this may be more likely the right side in terms of facet joints versus the SI joint.  On exam he still has excellent range of motion of both hips and I can easily put his right hip to internal and external rotation with no significant discomfort and especially no discomfort in the groin.  When I have him lay on his side with the right side up I can palpate along the lower lumbar spine to the right side into the pelvis near the SI joint as a source of his pain.  He has 5 out of 5 strength of his bilateral lower extremities and normal sensation as well.  I feel that it would be appropriate to send him to Dr. Ernestina Patches for a right-sided L5-S1 facet joint injection versus the SI joint depending on what Dr. Ernestina Patches feels is more appropriate.  This is 1 time I feel that having him see Dr. Ernestina Patches for possible intervention prior to an MRI would be fine given the fact the patient has no weakness in his legs no radicular complaints going down his legs.  All question concerns were answered addressed.  We will work on making that appointment.

## 2019-01-28 ENCOUNTER — Encounter: Payer: Self-pay | Admitting: Physical Medicine and Rehabilitation

## 2019-01-28 ENCOUNTER — Ambulatory Visit: Payer: Self-pay

## 2019-01-28 ENCOUNTER — Ambulatory Visit (INDEPENDENT_AMBULATORY_CARE_PROVIDER_SITE_OTHER): Payer: Medicare Other | Admitting: Physical Medicine and Rehabilitation

## 2019-01-28 VITALS — BP 127/64 | HR 52

## 2019-01-28 DIAGNOSIS — M47816 Spondylosis without myelopathy or radiculopathy, lumbar region: Secondary | ICD-10-CM | POA: Diagnosis not present

## 2019-01-28 MED ORDER — METHYLPREDNISOLONE ACETATE 80 MG/ML IJ SUSP: 80.0000 mg | Freq: Once | INTRAMUSCULAR | Status: DC

## 2019-01-28 NOTE — Progress Notes (Signed)
 .  Numeric Pain Rating Scale and Functional Assessment Average Pain 8   In the last MONTH (on 0-10 scale) has pain interfered with the following?  1. General activity like being  able to carry out your everyday physical activities such as walking, climbing stairs, carrying groceries, or moving a chair?  Rating(8)   +Driver, +BT(eliquis, ok for inj), -Dye Allergies.

## 2019-02-03 NOTE — Procedures (Signed)
Lumbar Facet Joint Intra-Articular Injection(s) with Fluoroscopic Guidance  Patient: [name] [name]      Date of Birth: 12/24/1945 MRN: [id number] PCP: Christain Sacramento, MD      Visit Date: 01/28/2019   Universal Protocol:    Date/Time: 01/28/2019  Consent Given By: the patient  Position: PRONE   Additional Comments: Vital signs were monitored before and after the procedure. Patient was prepped and draped in the usual sterile fashion. The correct patient, procedure, and site was verified.   Injection Procedure Details:  Procedure Site One Meds Administered:  Meds ordered this encounter  Medications  . methylPREDNISolone acetate (DEPO-MEDROL) injection 80 mg     Laterality: Bilateral  Location/Site:  L5-S1  Needle size: 22 guage  Needle type: Spinal  Needle Placement: Articular  Findings:  -Comments: Excellent flow of contrast producing a partial arthrogram.  Procedure Details: The fluoroscope beam is vertically oriented in AP, and the inferior recess is visualized beneath the lower pole of the inferior apophyseal process, which represents the target point for needle insertion. When direct visualization is difficult the target point is located at the medial projection of the vertebral pedicle. The region overlying each aforementioned target is locally anesthetized with a 1 to 2 ml. volume of 1% Lidocaine without Epinephrine.   The spinal needle was inserted into each of the above mentioned facet joints using biplanar fluoroscopic guidance. A 0.25 to 0.5 ml. volume of Isovue-250 was injected and a partial facet joint arthrogram was obtained. A single spot film was obtained of the resulting arthrogram.    One to 1.25 ml of the steroid/anesthetic solution was then injected into each of the facet joints noted above.   Additional Comments:  The patient tolerated the procedure well Dressing: 2 x 2 sterile gauze and Band-Aid    Post-procedure details: Patient was observed  during the procedure. Post-procedure instructions were reviewed.  Patient left the clinic in stable condition.

## 2019-02-03 NOTE — Progress Notes (Signed)
[name] [name] - 73 y.o. male MRN [id number]  Date of birth: [birth year]  Office Visit Note: Visit Date: 01/28/2019 PCP: Christain Sacramento, MD Referred by: Christain Sacramento, MD  Subjective: Chief Complaint  Patient presents with  . Lower Back - Pain  . Right Thigh - Pain  . Left Thigh - Pain   HPI:  [name] [name] is a 73 y.o. male who comes in today At the request of Dr. Jean Rosenthal for diagnostic right L5-S1 facet joint block versus sacroiliac joint injection.  Patient was seeing Dr. Ninfa Linden for right hip pain.  He underwent ultrasound-guided right intra-articular hip injection without much relief.  He comes in today complaining of bilateral low back pain at the lumbosacral junction with pain into both posterior lateral thighs.  X-ray imaging showing facet arthropathy.  Pretty unlikely that the 73 year old male would have sacroiliac joint pain at this point without some other overriding issues.  We will complete diagnostic of therapeutic bilateral L5-S1 facet joint blocks.  Consider MRI imaging for possible stenosis.  ROS Otherwise per HPI.  Assessment & Plan: Visit Diagnoses:  1. Spondylosis without myelopathy or radiculopathy, lumbar region     Plan: No additional findings.   Meds & Orders:  Meds ordered this encounter  Medications  . methylPREDNISolone acetate (DEPO-MEDROL) injection 80 mg    Orders Placed This Encounter  Procedures  . Facet Injection  . XR C-ARM NO REPORT    Follow-up: No follow-ups on file.   Procedures: No procedures performed  Lumbar Facet Joint Intra-Articular Injection(s) with Fluoroscopic Guidance  Patient: [name] [name]      Date of Birth: 1945/10/12 MRN: [id number] PCP: Christain Sacramento, MD      Visit Date: 01/28/2019   Universal Protocol:    Date/Time: 01/28/2019  Consent Given By: the patient  Position: PRONE   Additional Comments: Vital signs were monitored before and after the procedure. Patient was prepped and draped  in the usual sterile fashion. The correct patient, procedure, and site was verified.   Injection Procedure Details:  Procedure Site One Meds Administered:  Meds ordered this encounter  Medications  . methylPREDNISolone acetate (DEPO-MEDROL) injection 80 mg     Laterality: Bilateral  Location/Site:  L5-S1  Needle size: 22 guage  Needle type: Spinal  Needle Placement: Articular  Findings:  -Comments: Excellent flow of contrast producing a partial arthrogram.  Procedure Details: The fluoroscope beam is vertically oriented in AP, and the inferior recess is visualized beneath the lower pole of the inferior apophyseal process, which represents the target point for needle insertion. When direct visualization is difficult the target point is located at the medial projection of the vertebral pedicle. The region overlying each aforementioned target is locally anesthetized with a 1 to 2 ml. volume of 1% Lidocaine without Epinephrine.   The spinal needle was inserted into each of the above mentioned facet joints using biplanar fluoroscopic guidance. A 0.25 to 0.5 ml. volume of Isovue-250 was injected and a partial facet joint arthrogram was obtained. A single spot film was obtained of the resulting arthrogram.    One to 1.25 ml of the steroid/anesthetic solution was then injected into each of the facet joints noted above.   Additional Comments:  The patient tolerated the procedure well Dressing: 2 x 2 sterile gauze and Band-Aid    Post-procedure details: Patient was observed during the procedure. Post-procedure instructions were reviewed.  Patient left the clinic in stable condition.    Clinical History:  No specialty comments available.     Objective:  VS:  HT:    WT:   BMI:     BP:127/64  HR:(!) 52bpm  TEMP: ( )  RESP:  Physical Exam  Ortho Exam Imaging: No results found.

## 2019-02-19 ENCOUNTER — Encounter: Payer: Medicare Other | Admitting: *Deleted

## 2019-02-25 ENCOUNTER — Other Ambulatory Visit (HOSPITAL_COMMUNITY): Payer: Self-pay

## 2019-02-25 MED ORDER — EMPAGLIFLOZIN 10 MG PO TABS: 10.0000 mg | ORAL_TABLET | Freq: Every day | ORAL | 3 refills | Status: DC

## 2019-03-10 ENCOUNTER — Ambulatory Visit (INDEPENDENT_AMBULATORY_CARE_PROVIDER_SITE_OTHER): Payer: Medicare Other | Admitting: *Deleted

## 2019-03-10 DIAGNOSIS — I5042 Chronic combined systolic (congestive) and diastolic (congestive) heart failure: Secondary | ICD-10-CM

## 2019-03-10 DIAGNOSIS — I255 Ischemic cardiomyopathy: Secondary | ICD-10-CM

## 2019-03-10 LAB — CUP PACEART REMOTE DEVICE CHECK
Battery Remaining Longevity: 123 mo
Battery Voltage: 3.02 V
Brady Statistic RV Percent Paced: 0.64 %
Date Time Interrogation Session: 20201110165830
HighPow Impedance: 72 Ohm
Implantable Lead Implant Date: 20190724
Implantable Lead Location: 753860
Implantable Pulse Generator Implant Date: 20190724
Lead Channel Impedance Value: 323 Ohm
Lead Channel Impedance Value: 399 Ohm
Lead Channel Pacing Threshold Amplitude: 0.625 V
Lead Channel Pacing Threshold Pulse Width: 0.4 ms
Lead Channel Sensing Intrinsic Amplitude: 15 mV
Lead Channel Sensing Intrinsic Amplitude: 15 mV
Lead Channel Setting Pacing Amplitude: 2.5 V
Lead Channel Setting Pacing Pulse Width: 0.4 ms
Lead Channel Setting Sensing Sensitivity: 0.3 mV

## 2019-03-15 ENCOUNTER — Other Ambulatory Visit (HOSPITAL_COMMUNITY): Payer: Self-pay | Admitting: Internal Medicine

## 2019-03-25 ENCOUNTER — Other Ambulatory Visit (HOSPITAL_COMMUNITY): Payer: Self-pay

## 2019-03-25 MED ORDER — ATORVASTATIN CALCIUM 80 MG PO TABS: 80.0000 mg | ORAL_TABLET | Freq: Every day | ORAL | 3 refills | Status: DC

## 2019-03-31 NOTE — Progress Notes (Signed)
Remote ICD transmission.   

## 2019-04-01 ENCOUNTER — Encounter: Payer: Self-pay | Admitting: Orthopaedic Surgery

## 2019-04-01 ENCOUNTER — Ambulatory Visit (INDEPENDENT_AMBULATORY_CARE_PROVIDER_SITE_OTHER): Payer: Medicare Other | Admitting: Orthopaedic Surgery

## 2019-04-01 ENCOUNTER — Other Ambulatory Visit: Payer: Self-pay

## 2019-04-01 DIAGNOSIS — G8929 Other chronic pain: Secondary | ICD-10-CM | POA: Diagnosis not present

## 2019-04-01 DIAGNOSIS — M25551 Pain in right hip: Secondary | ICD-10-CM | POA: Diagnosis not present

## 2019-04-01 DIAGNOSIS — I255 Ischemic cardiomyopathy: Secondary | ICD-10-CM | POA: Diagnosis not present

## 2019-04-01 DIAGNOSIS — M4807 Spinal stenosis, lumbosacral region: Secondary | ICD-10-CM

## 2019-04-01 DIAGNOSIS — M5441 Lumbago with sciatica, right side: Secondary | ICD-10-CM | POA: Diagnosis not present

## 2019-04-01 NOTE — Progress Notes (Signed)
The patient comes in today still frustrated with the pain that he is having across the lower aspect of his lumbar spine and the top of his pelvis.  I sent him to Dr. Ernestina Patches for considering SI joint injections versus facet joint injections.  Based on his exam he had bilateral L5-S1 injections.  The patient states that this did not help at all.  He still points to the lower aspect of his lumbar spine and upper pelvis is source of his pain.  He says when he is around his house it does not really hurt but if he walks for any distance he gets severe pain.  He denies any radicular symptoms at all.  He is a very active 73 year old.  He does have a defibrillator so we can not obtain an MRI.  He is still frustrated by this.  On exam his hip exam is normal bilaterally.  He has a negative straight leg raise bilaterally.  He has excellent strength in his lower extremities and normal sensation.  He is a thin individual.  He has significant pain at the lower aspect of the lumbar spine and the top of the pelvis.  At this point I would like to at least obtain a noncontrast CT scan of the lumbar spine and the pelvis to assess for any pathology may be contributing to the pain that he is having so we can potentially determine the best treatment options.  My neck step after this would be even considering spine physical therapy but he would like to have the studies first and I agree with this.  We will see him back in 2 weeks to go over the studies.

## 2019-04-06 ENCOUNTER — Encounter: Payer: Self-pay | Admitting: Orthopaedic Surgery

## 2019-04-07 ENCOUNTER — Encounter: Payer: Self-pay | Admitting: Orthopaedic Surgery

## 2019-04-15 ENCOUNTER — Ambulatory Visit: Payer: Medicare Other | Admitting: Orthopaedic Surgery

## 2019-04-16 ENCOUNTER — Ambulatory Visit
Admission: RE | Admit: 2019-04-16 | Discharge: 2019-04-16 | Disposition: A | Discharge status: Home / Self Care | Payer: Medicare Other | Source: Ambulatory Visit | Attending: Orthopaedic Surgery | Admitting: Orthopaedic Surgery

## 2019-04-16 ENCOUNTER — Other Ambulatory Visit: Payer: Self-pay

## 2019-04-16 DIAGNOSIS — M4807 Spinal stenosis, lumbosacral region: Secondary | ICD-10-CM

## 2019-04-16 DIAGNOSIS — M25551 Pain in right hip: Secondary | ICD-10-CM

## 2019-04-20 ENCOUNTER — Other Ambulatory Visit: Payer: Self-pay

## 2019-04-20 ENCOUNTER — Encounter: Payer: Self-pay | Admitting: Orthopaedic Surgery

## 2019-04-20 ENCOUNTER — Ambulatory Visit (INDEPENDENT_AMBULATORY_CARE_PROVIDER_SITE_OTHER): Payer: Medicare Other | Admitting: Orthopaedic Surgery

## 2019-04-20 DIAGNOSIS — G8929 Other chronic pain: Secondary | ICD-10-CM | POA: Diagnosis not present

## 2019-04-20 DIAGNOSIS — M5441 Lumbago with sciatica, right side: Secondary | ICD-10-CM

## 2019-04-20 DIAGNOSIS — I255 Ischemic cardiomyopathy: Secondary | ICD-10-CM

## 2019-04-20 DIAGNOSIS — M4807 Spinal stenosis, lumbosacral region: Secondary | ICD-10-CM

## 2019-04-20 DIAGNOSIS — M25551 Pain in right hip: Secondary | ICD-10-CM | POA: Diagnosis not present

## 2019-04-20 NOTE — Progress Notes (Signed)
The patient continues to experience frustrating her low back pain for him that he points to the lower suspect lumbar spine in the sciatic regions on both sides.  Also the SI joint area is causing pain in this rating down his right backside to behind his right knee.  He states that his pain does move around some.  He actually had bilateral L5-S1 facet joint injections by Dr. Ernestina Patches.  The patient states this did help with some of his pain but he still frustrated about his decreased level activity once he has been up and walking for long distances.  He is 73 years old.  He is on Eliquis.  He has defibrillator.  We did obtain a CT scan lumbar spine and a CT scan of the pelvis and hip to make sure is not a hip issue.  His hip exam though is normal.  The CT scan does show just only mild arthritic changes of both hips and his exam still shows fluid range of motion of left hip and right hip with no difficulty at all.  The SI joints were unremarkable and CT scan.  On exam he is very flexible and he does have pain lower aspect of his lumbar spine more to the right than the left but it seems to be bilateral at the lower aspect the lumbar spine.  The CT scan of his lumbar spine did show facet hypertrophy at L4-L5 with moderate to severe lateral recess stenosis bilaterally and right neuroforaminal stenosis with potential impingement of both L5 nerve roots.  At this point he would like referral to neurosurgery so he can consider all of his options given the failure of other conservative treatment measures.  I let him know that I am happy to make that referral and they still may recommend other types of injections for his back but he would like at least to get an opinion from them.  We will work on making this referral.  All question concerns were answered and addressed.

## 2019-04-21 IMAGING — US US SCROTUM W/ DOPPLER COMPLETE
1 series · 13 of 25 positions shown · non-contrast
Comparison: Prior CT from 11/23/2014.

CLINICAL DATA: Initial evaluation for acute swelling for 4 days.

EXAM:
SCROTAL ULTRASOUND
DOPPLER ULTRASOUND OF THE TESTICLES
TECHNIQUE: Complete ultrasound examination of the testicles, epididymis, and
other scrotal structures was performed. Color and spectral Doppler
ultrasound were also utilized to evaluate blood flow to the
testicles.

[Series 1: us scrotum w/ doppler complete · 13 of 58 slices shown]
[im 1/58]
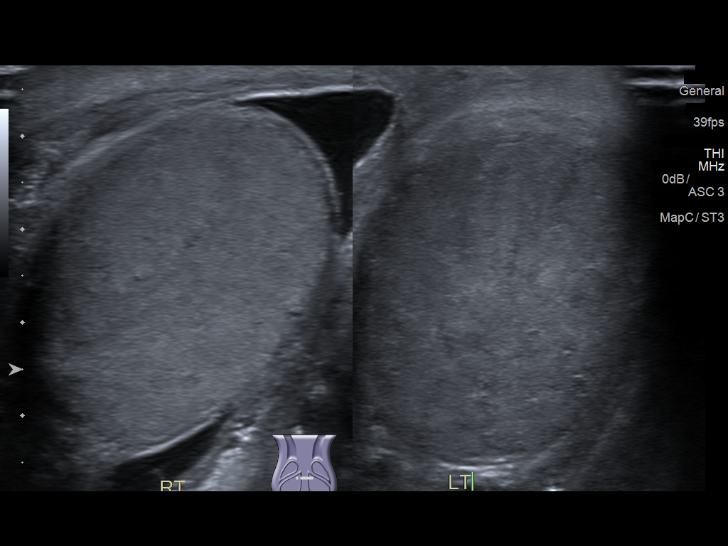
[im 5/58]
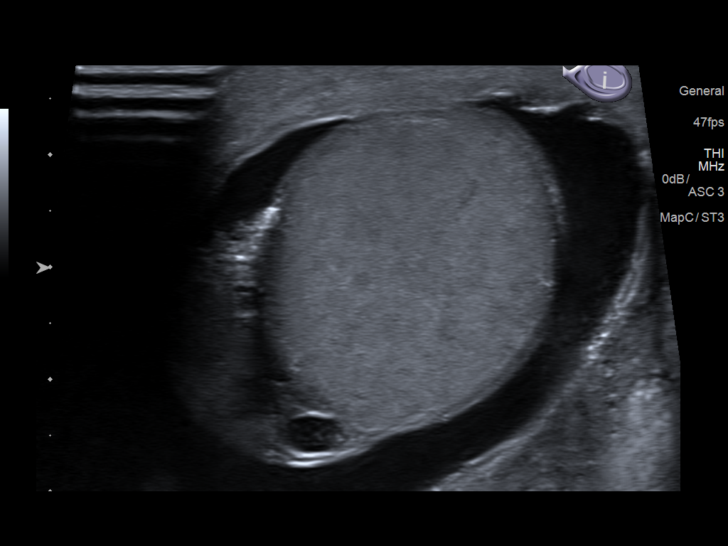
[im 10/58]
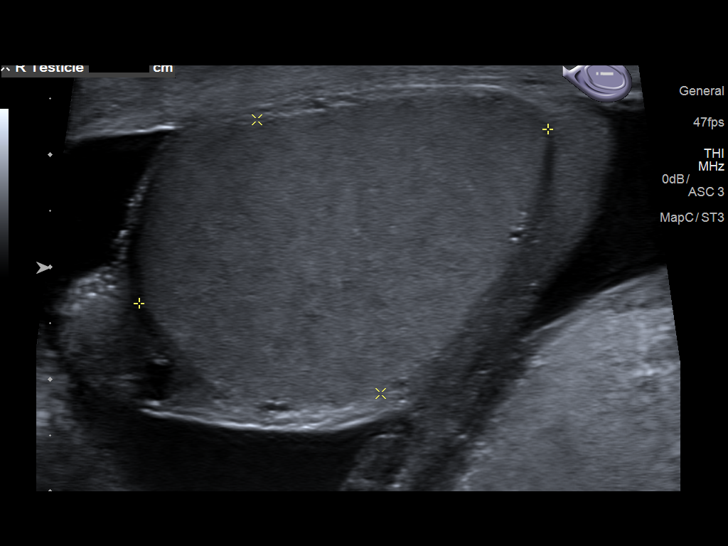
[im 15/58]
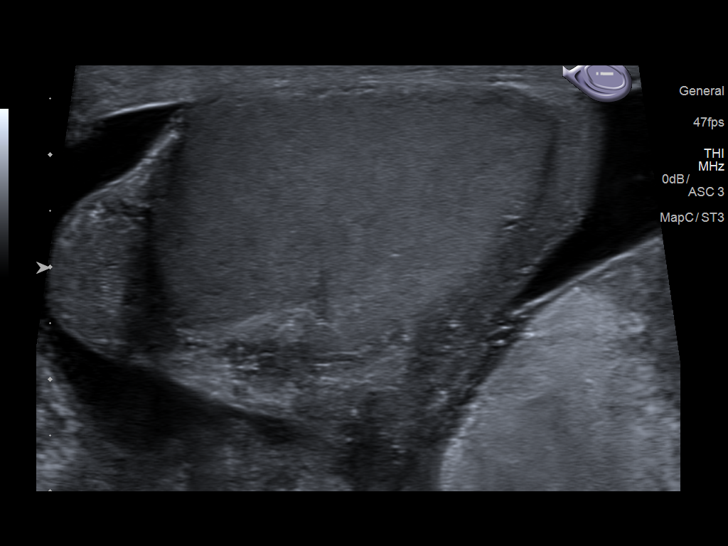
[im 20/58]
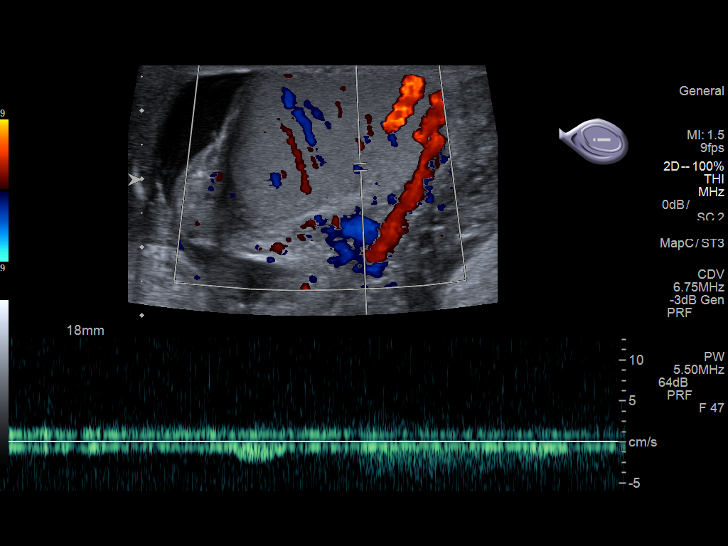
[im 24/58]
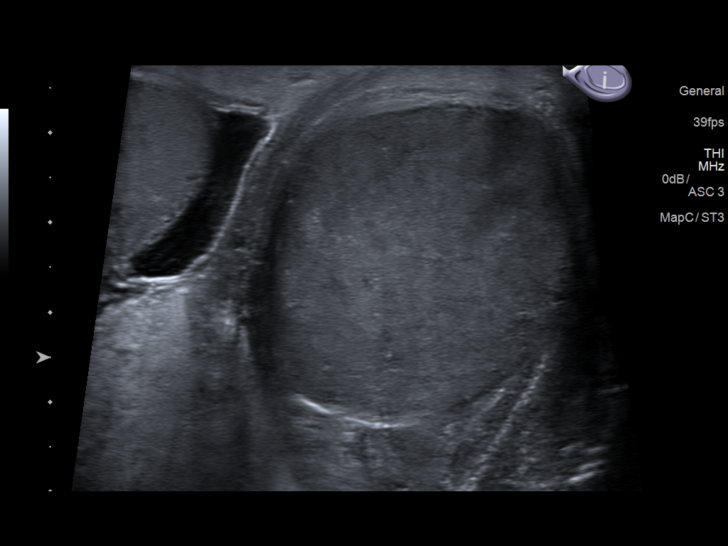
[im 29/58]
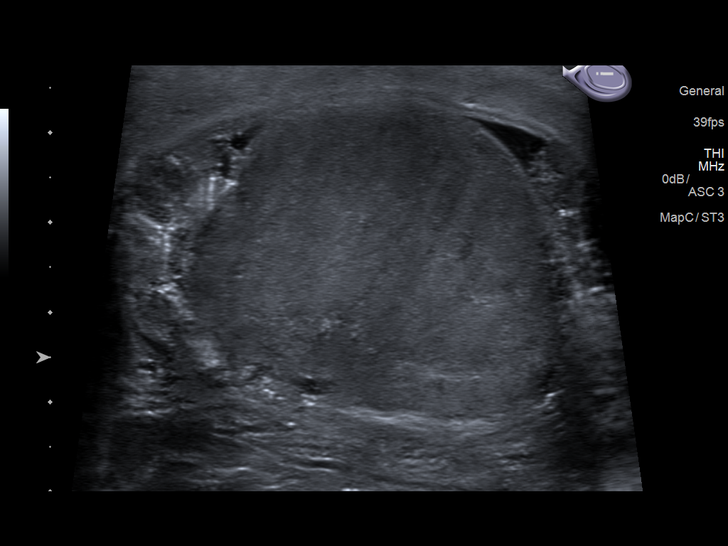
[im 34/58]
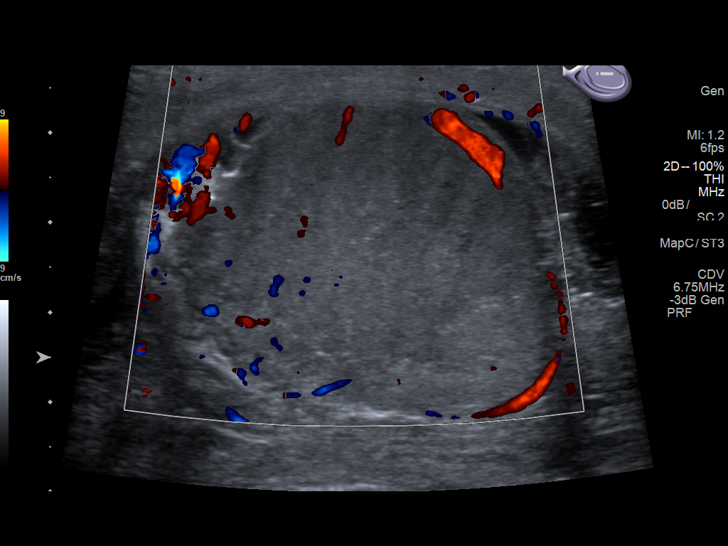
[im 39/58]
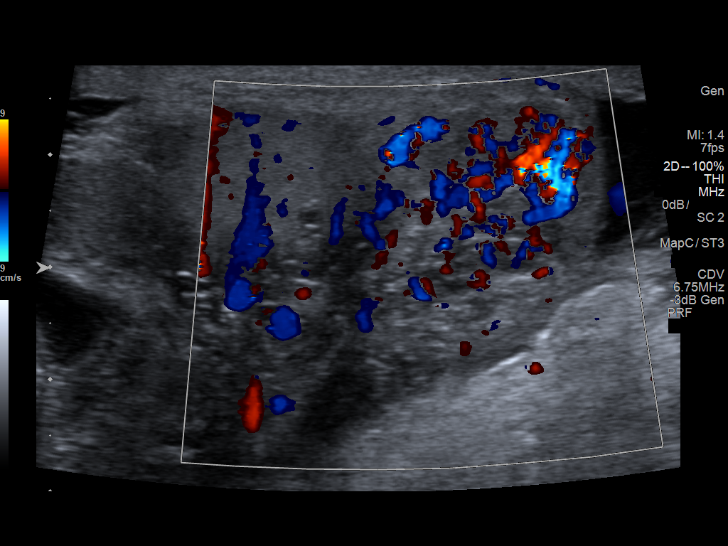
[im 43/58]
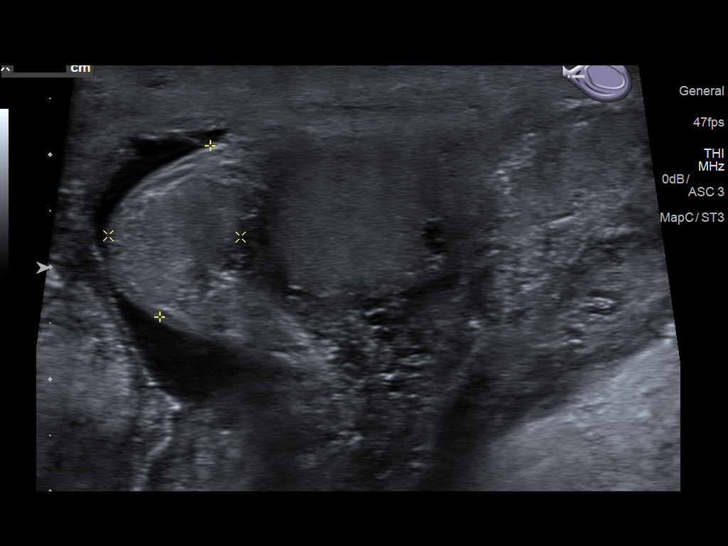
[im 48/58]
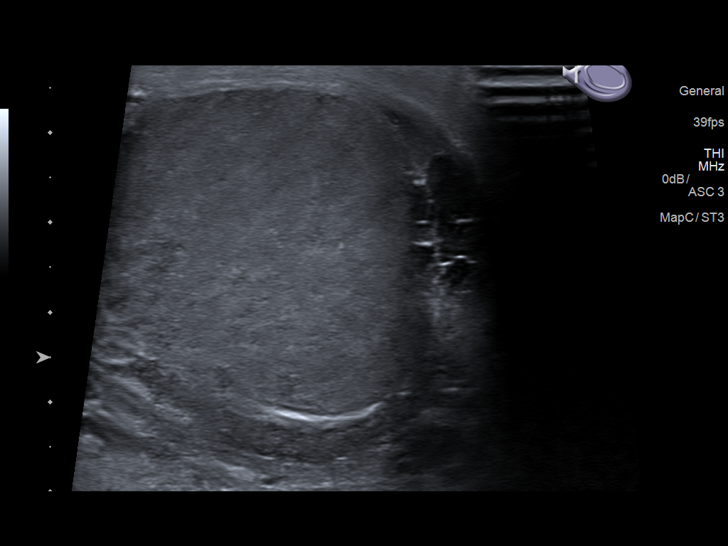
[im 53/58]
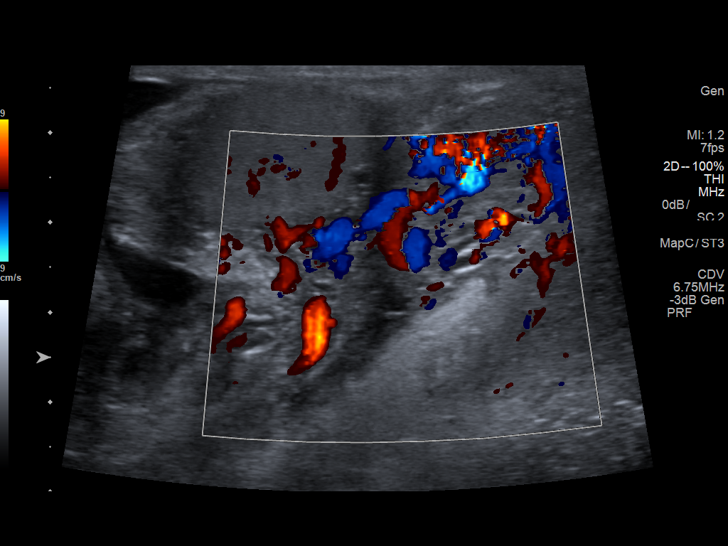
[im 58/58]
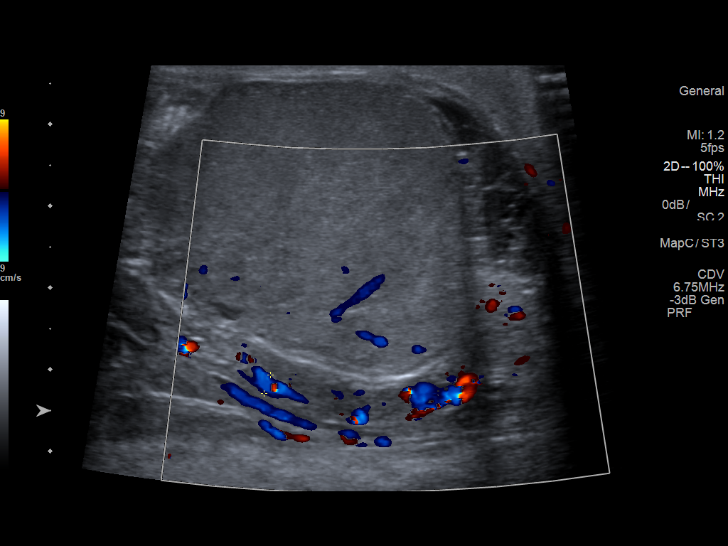

[13 of 25 positions shown; findings below may reference images not displayed]

FINDINGS: Right testicle

Measurements: 4.0 x 2.7 x 2.7 cm. 4 mm complex cystic lesion at the
level of the rete testes, likely focal tubular ectasia of or
possibly a small spermatocele. No associated vascularity or other
concerning features. This is of doubtful significance. No other mass
lesion. No microlithiasis.

Left testicle

Measurements: 4.3 x 3.5 x 3.3 cm. No mass or microlithiasis
visualized.

Right epididymis: Right epididymis is somewhat enlarged and
hypervascular as compared to the left, which may reflect sequelae of
acute epididymitis.

Left epididymis:  Normal in size and appearance.

Hydrocele:  Moderate right-sided hydrocele.

Varicocele:  Small right-sided varicocele.

Diffuse scrotal edema noted.

Pulsed Doppler interrogation of both testes demonstrates normal low
resistance arterial and venous waveforms bilaterally.
IMPRESSION: 1. Enlarged and hypervascular right epididymis, suggesting possible
acute epididymitis.
2. Moderate right-sided hydrocele, likely reactive.
3. Diffuse scrotal edema.
4. Small right-sided varicocele.
5. No evidence for testicular torsion.

## 2019-04-27 ENCOUNTER — Telehealth (HOSPITAL_COMMUNITY): Payer: Self-pay | Admitting: Pharmacist

## 2019-04-27 MED ORDER — CARVEDILOL 3.125 MG PO TABS: 3.1250 mg | ORAL_TABLET | Freq: Two times a day (BID) | ORAL | 3 refills | Status: DC

## 2019-04-27 NOTE — Telephone Encounter (Signed)
Sent in refill for carvedilol to [facility] per patient request.

## 2019-05-11 ENCOUNTER — Other Ambulatory Visit: Payer: Self-pay | Admitting: Neurosurgery

## 2019-05-11 DIAGNOSIS — M48062 Spinal stenosis, lumbar region with neurogenic claudication: Secondary | ICD-10-CM

## 2019-05-12 ENCOUNTER — Telehealth: Payer: Self-pay | Admitting: Nurse Practitioner

## 2019-05-12 NOTE — Telephone Encounter (Signed)
Phone call to patient to verify medication list and allergies for myelogram procedure. Pt aware he will need to hold his eliquis for the myelogram appointment, pending approval and recommended hold time from his cardiologist Dr. Ave Filter. Pt verbalized understanding. Pre and post procedure instructions reviewed with pt. Thinner hold request faxed to Dr. Ave Filter, awaiting reply.

## 2019-05-13 ENCOUNTER — Other Ambulatory Visit: Payer: Self-pay

## 2019-05-13 ENCOUNTER — Ambulatory Visit (HOSPITAL_COMMUNITY)
Admission: RE | Admit: 2019-05-13 | Discharge: 2019-05-13 | Disposition: A | Discharge status: Home / Self Care | Payer: Medicare Other | Source: Ambulatory Visit | Attending: Surgery | Admitting: Surgery

## 2019-05-13 ENCOUNTER — Other Ambulatory Visit (HOSPITAL_COMMUNITY): Payer: Self-pay | Admitting: Neurosurgery

## 2019-05-13 DIAGNOSIS — I739 Peripheral vascular disease, unspecified: Secondary | ICD-10-CM | POA: Diagnosis present

## 2019-05-22 ENCOUNTER — Ambulatory Visit
Admission: RE | Admit: 2019-05-22 | Discharge: 2019-05-22 | Disposition: A | Discharge status: Home / Self Care | Payer: Medicare Other | Source: Ambulatory Visit | Attending: Neurosurgery | Admitting: Neurosurgery

## 2019-05-22 DIAGNOSIS — M48062 Spinal stenosis, lumbar region with neurogenic claudication: Secondary | ICD-10-CM

## 2019-05-22 MED ORDER — IOPAMIDOL (ISOVUE-M 200) INJECTION 41%
20.0000 mL | Freq: Once | INTRAMUSCULAR | Status: AC
  Administered 2019-05-22: 20 mL via INTRATHECAL

## 2019-05-22 MED ORDER — DIAZEPAM 5 MG PO TABS
5.0000 mg | ORAL_TABLET | Freq: Once | ORAL | Status: AC
  Administered 2019-05-22: 5 mg via ORAL

## 2019-05-22 NOTE — Discharge Instructions (Signed)

## 2019-05-23 ENCOUNTER — Ambulatory Visit: Payer: Medicare Other | Attending: Internal Medicine

## 2019-05-23 DIAGNOSIS — Z23 Encounter for immunization: Secondary | ICD-10-CM

## 2019-05-23 NOTE — Progress Notes (Signed)
   Covid-19 Vaccination Clinic  Name:  ADNREW HONKALA    MRN: [id number] DOB: 03/24/1946  05/23/2019  Mr. Girdner was observed post Covid-19 immunization for 15 minutes without incidence. He was provided with Vaccine Information Sheet and instruction to access the V-Safe system.   Mr. Eckert was instructed to call 911 with any severe reactions post vaccine: [name] Difficulty breathing  . Swelling of your face and throat  . A fast heartbeat  . A bad rash all over your body  . Dizziness and weakness    Immunizations Administered    Name Date Dose VIS Date Route   Pfizer COVID-19 Vaccine 05/23/2019  2:47 PM 0.3 mL 04/10/2019 Intramuscular   Manufacturer: [place]   Lot: [id number]   [place]: [id number]

## 2019-06-03 ENCOUNTER — Telehealth: Payer: Self-pay | Admitting: *Deleted

## 2019-06-03 NOTE — Telephone Encounter (Signed)
Pt takes Eliquis for afib with CHADS2VASc score of 5 (age, CHF, HTN, DM, CAD), prior LV thrombus in 03/2017 - still present on 11/2018 echo as "very thin laminated apical thrombus noted with definity, clearly improved from prior study." SCr 1.41, CrCl 3mL/min.  Typically hold Eliquis for 3 days prior to spinal injection. Will route to MD (pt followed by both Dr Haroldine Laws and Dr Caryl Comes) for input on anticoagulation hold in setting of apical thrombus.

## 2019-06-03 NOTE — Telephone Encounter (Signed)
   [facility] Pre-operative Risk Assessment    Request for surgical clearance:  1. What type of surgery is being performed? L4-5 LUMBAR LAMINECTOMY   2. When is this surgery scheduled? TBD   3. What type of clearance is required (medical clearance vs. Pharmacy clearance to hold med vs. Both)? BOTH  4. Are there any medications that need to be held prior to surgery and how long? ELIQUIS   5. Practice name and name of physician performing surgery? [facility]; DR. Mallie Mussel POOL   6. What is your office phone number [phone]; ATTN: VANESSA EXT [phone]   6.   What is your office fax number [phone] ATTN: VANESSA   8.   Anesthesia type (None, local, MAC, general) ? GENERAL   [name] [name] 06/03/2019, 1:23 PM  _________________________________________________________________   (provider comments below)

## 2019-06-03 NOTE — Telephone Encounter (Signed)
With an old laminate thrombus I would not change the recommendation for anticoagulation holding  [place]

## 2019-06-04 NOTE — Telephone Encounter (Signed)
Can this appointment actually be with the advanced heart failure team (Dr. Haroldine Laws or one of the APPs)? It looks like he is due for an appointment with them.   Thank you!

## 2019-06-04 NOTE — Telephone Encounter (Signed)
   Primary Cardiologist: Dr. Ardyth Man. Caryl Comes  Chart reviewed as part of pre-operative protocol coverage. Looks like patient is due for a follow-up with advanced CHF team. With patient's history of CHF and CAD, he is at least moderate risk for procedure based on Revised Cardiac Risk Index. Therefore, I think it is wise for patient to have a visit with advanced CHF team prior to procedure in order to better assess preoperative cardiovascular risk.   Pre-op covering staff: - Please schedule appointment and call patient to inform them. - Please contact requesting surgeon's office via preferred method (i.e, phone, fax) to inform them of need for appointment prior to surgery.  Darreld Mclean, PA-C  06/04/2019, 12:07 PM

## 2019-06-04 NOTE — Telephone Encounter (Signed)
Will send to Dr Haroldine Laws to see if pt will need any test or just a f/u appt

## 2019-06-04 NOTE — Telephone Encounter (Signed)
He is at least moderate risk for peri-op CV complications. Ok to hold eliquis for 3 days prior.

## 2019-06-04 NOTE — Telephone Encounter (Signed)
I will send a message to Dr. Olin Pia scheduler [place] to please reach out to the pt with an appt for pre op clearance.

## 2019-06-05 ENCOUNTER — Telehealth (HOSPITAL_COMMUNITY): Payer: Self-pay | Admitting: *Deleted

## 2019-06-05 NOTE — Telephone Encounter (Signed)
Received message from [facility] pt needs clearance for L4-5 Lumbar laminectomy under general anesthesia w/Dr Pool and clearance to hold Eliquis.  Per Dr Haroldine Laws: June 04, 2019 Jolaine Artist, MD to Me     4:15 PM Note   He is at least moderate risk for peri-op CV complications. Ok to hold eliquis for 3 days prior.      OV note faxed to [facility] at [phone] atten: Lorriane Shire

## 2019-06-05 NOTE — Telephone Encounter (Signed)
Clearance note faxed

## 2019-06-08 ENCOUNTER — Other Ambulatory Visit: Payer: Self-pay | Admitting: Neurosurgery

## 2019-06-09 ENCOUNTER — Ambulatory Visit (INDEPENDENT_AMBULATORY_CARE_PROVIDER_SITE_OTHER): Payer: Medicare Other | Admitting: *Deleted

## 2019-06-09 DIAGNOSIS — I255 Ischemic cardiomyopathy: Secondary | ICD-10-CM | POA: Diagnosis not present

## 2019-06-10 LAB — CUP PACEART REMOTE DEVICE CHECK
Battery Remaining Longevity: 121 mo
Battery Voltage: 3.01 V
Brady Statistic RV Percent Paced: 1.47 %
Date Time Interrogation Session: 20210210082706
HighPow Impedance: 72 Ohm
Implantable Lead Implant Date: 20190724
Implantable Lead Location: 753860
Implantable Pulse Generator Implant Date: 20190724
Lead Channel Impedance Value: 323 Ohm
Lead Channel Impedance Value: 399 Ohm
Lead Channel Pacing Threshold Amplitude: 0.625 V
Lead Channel Pacing Threshold Pulse Width: 0.4 ms
Lead Channel Sensing Intrinsic Amplitude: 13.5 mV
Lead Channel Sensing Intrinsic Amplitude: 13.5 mV
Lead Channel Setting Pacing Amplitude: 2.5 V
Lead Channel Setting Pacing Pulse Width: 0.4 ms
Lead Channel Setting Sensing Sensitivity: 0.3 mV

## 2019-06-10 NOTE — Progress Notes (Signed)
ICD Remote  

## 2019-06-11 ENCOUNTER — Encounter (HOSPITAL_COMMUNITY): Payer: Self-pay

## 2019-06-11 NOTE — Progress Notes (Signed)
I faxed Peri Op Device order sheet to the [facility]. I emailed [employer] Rep and Jovita Kussmaul, Therapist, sports.

## 2019-06-11 NOTE — Pre-Procedure Instructions (Signed)
[name] [name]  06/11/2019     Your procedure is scheduled on Tuesday, February 16.              Report to [facility], Main Entrance or Entrance "A" at 6:00 AM                            Your surgery or procedure is scheduled for 8:00 AM   Call this number if you have problems the morning of surgery: [phone]  This is the number for the Pre- Surgical Desk.               For any other questions, please call [phone], Monday - Friday 8 AM - 4 PM.   Remember:  Do not eat or drink after midnight the night before your surgery.   Take these medicines the morning of surgery with A SIP OF WATER: atorvastatin (LIPITOR) carvedilol (COREG)  1 STOP taking Aspirin, Aspirin Products (Goody Powder, Excedrin Migraine), Ibuprofen (Advil), Naproxen (Aleve), Vitamins and Herbal Products (ie Fish Oil). Follow your surgeon's instructions regarding Eliquis   WHAT DO I DO ABOUT MY DIABETES MEDICATION?         Do not take empagliflozin (JARDIANCE) the evening before surgery or the morning of surgery          Do NOT Take metFORMIN (GLUCOPHAGE-XR) the morning of surgery  How to Manage Your Diabetes Before and After Surgery  Why is it important to control my blood sugar before and after surgery? . Improving blood sugar levels before and after surgery helps healing and can limit problems. . A way of improving blood sugar control is eating a healthy diet by: o  Eating less sugar and carbohydrates o  Increasing activity/exercise o  Talking with your doctor about reaching your blood sugar goals . High blood sugars (greater than 180 mg/dL) can raise your risk of infections and slow your recovery, so you will need to focus on controlling your diabetes during the weeks before surgery. . Make sure that the doctor who takes care of your diabetes knows about your planned surgery including the date and location.  How do I manage my blood sugar before surgery? . Check your blood sugar at  least 4 times a day, starting 2 days before surgery, to make sure that the level is not too high or low. o Check your blood sugar the morning of your surgery when you wake up and every 2 hours until you get to the Short Stay unit. . If your blood sugar is less than 70 mg/dL, you will need to treat for low blood sugar: o Do not take insulin. o Treat a low blood sugar (less than 70 mg/dL) with  cup of clear juice (cranberry or apple), 4 glucose tablets, OR glucose gel. Recheck blood sugar in 15 minutes after treatment (to make sure it is greater than 70 mg/dL). If your blood sugar is not greater than 70 mg/dL on recheck, call [phone] o  for further instructions. . Report your blood sugar to the short stay nurse when you get to Short Stay.  . If you are admitted to the hospital after surgery: o Your blood sugar will be checked by the staff and you will probably be given insulin after surgery (instead of oral diabetes medicines) to make sure you have good blood sugar levels. o The goal for blood sugar control after surgery is 80-180 mg/dL.  Special instructions:   [employer]- Preparing For Surgery  Before surgery, you can play an important role. Because skin is not sterile, your skin needs to be as free of germs as possible. You can reduce the number of germs on your skin by washing with CHG (chlorahexidine gluconate) Soap before surgery.  CHG is an antiseptic cleaner which kills germs and bonds with the skin to continue killing germs even after washing.    Oral Hygiene is also important to reduce your risk of infection.  Remember - BRUSH YOUR TEETH THE MORNING OF SURGERY WITH YOUR REGULAR TOOTHPASTE  Please do not use if you have an allergy to CHG or antibacterial soaps. If your skin becomes reddened/irritated stop using the CHG.  Do not shave (including legs and underarms) for at least 48 hours prior to first CHG shower. It is OK to shave your face.  Please follow these instructions  carefully.   1. Shower the NIGHT BEFORE SURGERY and the MORNING OF SURGERY with CHG.   2. If you chose to wash your hair, wash your hair first as usual with your normal shampoo.  3. After you shampoo, rinse your hair and body thoroughly to remove the shampoo.  4. Use CHG as you would any other liquid soap. You can apply CHG directly to the skin and wash gently with a scrungie or a clean washcloth.   5. Apply the CHG Soap to your body ONLY FROM THE NECK DOWN.  Do not use on open wounds or open sores. Avoid contact with your eyes, ears, mouth and genitals (private parts). Wash Face and genitals (private parts)  with your normal soap.  6. Wash thoroughly, paying special attention to the area where your surgery will be performed.  7. Thoroughly rinse your body with warm water from the neck down.  8. DO NOT shower/wash with your normal soap after using and rinsing off the CHG Soap.  9. Pat yourself dry with a CLEAN TOWEL.  10. Wear CLEAN PAJAMAS to bed the night before surgery, wear comfortable clothes the morning of surgery  11. Place CLEAN SHEETS on your bed the night of your first shower and DO NOT SLEEP WITH PETS.  Day of Surgery: Shower as instructed above. Do not apply any deodorants/lotions, powders or colognes.  Please wear clean clothes to the hospital/surgery center.   Remember to brush your teeth WITH YOUR REGULAR TOOTHPASTE.            Do not wear ewelry, make-up or nail polish.   Men may shave face and neck.  Do not bring valuables to the hospital.  [facility] is not responsible for any belongings or valuables.  Contacts, dentures or bridgework may not be worn into surgery.  Leave your suitcase in the car.  After surgery it may be brought to your room.  For patients admitted to the hospital, discharge time will be determined by your treatment team.  Patients discharged the day of surgery will not be allowed to drive home.   Please read over the following fact sheets  that you were given.

## 2019-06-12 ENCOUNTER — Encounter (HOSPITAL_COMMUNITY)
Admission: RE | Admit: 2019-06-12 | Discharge: 2019-06-12 | Disposition: A | Discharge status: Home / Self Care | Payer: Medicare Other | Source: Ambulatory Visit | Attending: Neurosurgery | Admitting: Neurosurgery

## 2019-06-12 ENCOUNTER — Other Ambulatory Visit: Payer: Self-pay

## 2019-06-12 ENCOUNTER — Encounter (HOSPITAL_COMMUNITY): Payer: Self-pay

## 2019-06-12 ENCOUNTER — Other Ambulatory Visit (HOSPITAL_COMMUNITY)
Admission: RE | Admit: 2019-06-12 | Discharge: 2019-06-12 | Disposition: A | Discharge status: Home / Self Care | Payer: Medicare Other | Source: Ambulatory Visit | Attending: Neurosurgery | Admitting: Neurosurgery

## 2019-06-12 DIAGNOSIS — I255 Ischemic cardiomyopathy: Secondary | ICD-10-CM | POA: Diagnosis not present

## 2019-06-12 DIAGNOSIS — Z20822 Contact with and (suspected) exposure to covid-19: Secondary | ICD-10-CM | POA: Diagnosis not present

## 2019-06-12 DIAGNOSIS — Z01818 Encounter for other preprocedural examination: Secondary | ICD-10-CM | POA: Insufficient documentation

## 2019-06-12 DIAGNOSIS — I480 Paroxysmal atrial fibrillation: Secondary | ICD-10-CM | POA: Diagnosis not present

## 2019-06-12 DIAGNOSIS — E119 Type 2 diabetes mellitus without complications: Secondary | ICD-10-CM | POA: Diagnosis not present

## 2019-06-12 DIAGNOSIS — I5022 Chronic systolic (congestive) heart failure: Secondary | ICD-10-CM | POA: Insufficient documentation

## 2019-06-12 DIAGNOSIS — I2510 Atherosclerotic heart disease of native coronary artery without angina pectoris: Secondary | ICD-10-CM | POA: Diagnosis not present

## 2019-06-12 DIAGNOSIS — Z951 Presence of aortocoronary bypass graft: Secondary | ICD-10-CM | POA: Insufficient documentation

## 2019-06-12 DIAGNOSIS — Z95810 Presence of automatic (implantable) cardiac defibrillator: Secondary | ICD-10-CM | POA: Insufficient documentation

## 2019-06-12 HISTORY — DX: Pneumonia, unspecified organism: J18.9

## 2019-06-12 HISTORY — DX: Unspecified osteoarthritis, unspecified site: M19.90

## 2019-06-12 HISTORY — DX: Inflammatory liver disease, unspecified: K75.9

## 2019-06-12 HISTORY — DX: Presence of automatic (implantable) cardiac defibrillator: Z95.810

## 2019-06-12 HISTORY — DX: Peripheral vascular disease, unspecified: I73.9

## 2019-06-12 HISTORY — DX: Cardiac murmur, unspecified: R01.1

## 2019-06-12 LAB — BASIC METABOLIC PANEL
Anion gap: 10 (ref 5–15)
BUN: 21 mg/dL (ref 8–23)
CO2: 22 mmol/L (ref 22–32)
Calcium: 9.3 mg/dL (ref 8.9–10.3)
Chloride: 106 mmol/L (ref 98–111)
Creatinine, Ser: 1.37 mg/dL — ABNORMAL HIGH (ref 0.61–1.24)
GFR calc Af Amer: 59 mL/min — ABNORMAL LOW (ref >60)
GFR calc non Af Amer: 51 mL/min — ABNORMAL LOW (ref >60)
Glucose, Bld: 130 mg/dL — ABNORMAL HIGH (ref 70–99)
Potassium: 5.2 mmol/L — ABNORMAL HIGH (ref 3.5–5.1)
Sodium: 138 mmol/L (ref 135–145)

## 2019-06-12 LAB — SURGICAL PCR SCREEN
MRSA, PCR: NEGATIVE
Staphylococcus aureus: POSITIVE — AB

## 2019-06-12 LAB — CBC
HCT: 54.7 % — ABNORMAL HIGH (ref 39.0–52.0)
Hemoglobin: 16.7 g/dL (ref 13.0–17.0)
MCH: 29 pg (ref 26.0–34.0)
MCHC: 30.5 g/dL (ref 30.0–36.0)
MCV: 95 fL (ref 80.0–100.0)
Platelets: 227 10*3/uL (ref 150–400)
RBC: 5.76 MIL/uL (ref 4.22–5.81)
RDW: 13.1 % (ref 11.5–15.5)
WBC: 9.4 10*3/uL (ref 4.0–10.5)
nRBC: 0 % (ref 0.0–0.2)

## 2019-06-12 LAB — SARS CORONAVIRUS 2 (TAT 6-24 HRS): SARS Coronavirus 2: NEGATIVE

## 2019-06-12 LAB — HEMOGLOBIN A1C
Hgb A1c MFr Bld: 7.5 % — ABNORMAL HIGH (ref 4.8–5.6)
Mean Plasma Glucose: 168.55 mg/dL

## 2019-06-12 LAB — GLUCOSE, CAPILLARY: Glucose-Capillary: 137 mg/dL — ABNORMAL HIGH (ref 70–99)

## 2019-06-12 NOTE — Pre-Procedure Instructions (Signed)
Your procedure is scheduled on Tuesday, February 16th, from 09:30 AM to 11:09 AM.              Report to [facility], Main Entrance or Entrance "A" at 07:30 AM                              Call this number if you have problems the morning of surgery: [phone]  This is the number for the Pre- Surgical Desk.                For any other questions, please call [phone], Monday - Friday 8 AM - 4 PM.   Remember:  Do not eat or drink after midnight the night before your surgery.   Take these medicines the morning of surgery with A SIP OF WATER: atorvastatin (LIPITOR) carvedilol (COREG)  As of today, STOP taking Aspirin, Aspirin Products (Goody Powder, Excedrin Migraine), Ibuprofen (Advil), Naproxen (Aleve), Vitamins and Herbal Products (ie Fish Oil).   Stop Eliquis 3 days prior to surgery.    WHAT DO I DO ABOUT MY DIABETES MEDICATION?           Do not take empagliflozin (JARDIANCE) the DAY before surgery or the morning of surgery            Do NOT Take metFORMIN (GLUCOPHAGE-XR) the morning of surgery.    How to Manage Your Diabetes Before and After Surgery  Why is it important to control my blood sugar before and after surgery? . Improving blood sugar levels before and after surgery helps healing and can limit problems. . A way of improving blood sugar control is eating a healthy diet by: o  Eating less sugar and carbohydrates o  Increasing activity/exercise o  Talking with your doctor about reaching your blood sugar goals . High blood sugars (greater than 180 mg/dL) can raise your risk of infections and slow your recovery, so you will need to focus on controlling your diabetes during the weeks before surgery. . Make sure that the doctor who takes care of your diabetes knows about your planned surgery including the date and location.  How do I manage my blood sugar before surgery? . Check your blood sugar at least 4 times a day, starting 2 days before  surgery, to make sure that the level is not too high or low. o Check your blood sugar the morning of your surgery when you wake up and every 2 hours until you get to the Short Stay unit. . If your blood sugar is less than 70 mg/dL, you will need to treat for low blood sugar: o Do not take insulin. o Treat a low blood sugar (less than 70 mg/dL) with  cup of clear juice (cranberry or apple), 4 glucose tablets, OR glucose gel. Recheck blood sugar in 15 minutes after treatment (to make sure it is greater than 70 mg/dL). If your blood sugar is not greater than 70 mg/dL on recheck, call [phone] o  for further instructions. . Report your blood sugar to the short stay nurse when you get to Short Stay.  . If you are admitted to the hospital after surgery: o Your blood sugar will be checked by the staff and you will probably be given insulin after surgery (instead of oral diabetes medicines) to make sure you have good blood sugar levels. o The goal for blood sugar control after surgery is 80-180 mg/dL.  Special instructions:   [place]- Preparing For Surgery  Before surgery, you can play an important role. Because skin is not sterile, your skin needs to be as free of germs as possible. You can reduce the number of germs on your skin by washing with CHG (chlorahexidine gluconate) Soap before surgery.  CHG is an antiseptic cleaner which kills germs and bonds with the skin to continue killing germs even after washing.    Oral Hygiene is also important to reduce your risk of infection.  Remember - BRUSH YOUR TEETH THE MORNING OF SURGERY WITH YOUR REGULAR TOOTHPASTE  Please do not use if you have an allergy to CHG or antibacterial soaps. If your skin becomes reddened/irritated stop using the CHG.  Do not shave (including legs and underarms) for at least 48 hours prior to first CHG shower. It is OK to shave your face.  Please follow these instructions carefully.   1. Shower the NIGHT BEFORE  SURGERY and the MORNING OF SURGERY with CHG.   2. If you chose to wash your hair, wash your hair first as usual with your normal shampoo.  3. After you shampoo, rinse your hair and body thoroughly to remove the shampoo.  4. Use CHG as you would any other liquid soap. You can apply CHG directly to the skin and wash gently with a scrungie or a clean washcloth.   5. Apply the CHG Soap to your body ONLY FROM THE NECK DOWN.  Do not use on open wounds or open sores. Avoid contact with your eyes, ears, mouth and genitals (private parts). Wash Face and genitals (private parts)  with your normal soap.  6. Wash thoroughly, paying special attention to the area where your surgery will be performed.  7. Thoroughly rinse your body with warm water from the neck down.  8. DO NOT shower/wash with your normal soap after using and rinsing off the CHG Soap.  9. Pat yourself dry with a CLEAN TOWEL.  10. Wear CLEAN PAJAMAS to bed the night before surgery, wear comfortable clothes the morning of surgery  11. Place CLEAN SHEETS on your bed the night of your first shower and DO NOT SLEEP WITH PETS.  Day of Surgery: Shower as instructed above. Do not apply any deodorants/lotions, powders or colognes.  Please wear clean clothes to the hospital/surgery center.   Remember to brush your teeth WITH YOUR REGULAR TOOTHPASTE.          Men may shave face and neck.  Do not bring valuables to the hospital.  [facility] is not responsible for any belongings or valuables.  Contacts, dentures or bridgework may not be worn into surgery.  Leave your suitcase in the car.  After surgery it may be brought to your room.  For patients admitted to the hospital, discharge time will be determined by your treatment team.  Patients discharged the day of surgery will not be allowed to drive home.   Please read over the following fact sheets that you were given.

## 2019-06-12 NOTE — Progress Notes (Addendum)
PCP - Kathryne Eriksson, MD Cardiologist - Glori Bickers, MD  PPM/ICD - Medtronic ICD Device Orders - Faxed/ Requested  06/11/19 Rep Notified - Yes  Chest x-ray - N/A EKG - 01/06/2019 Stress Test -  ECHO - 12/10/2018 Cardiac Cath - 03/31/2017  Sleep Study - Denies  Fasting Blood Sugar - 100-135 Patient states he does not check blood sugar; only checks blood sugar during office visits. CBG during PAT visit was 137.  Blood Thinner Instructions: Stop Eliquis 3 days prior to sx. Per patient, his last dose is 06/12/19. Aspirin Instructions: N/A  ERAS Protcol - N/A PRE-SURGERY Ensure or G2- N/A  COVID TEST- 06/12/19   Anesthesia review: Yes, Cardiac hx.  Patient denies shortness of breath, fever, cough and chest pain at PAT appointment   All instructions explained to the patient, with a verbal understanding of the material. Patient agrees to go over the instructions while at home for a better understanding. Patient also instructed to self quarantine after being tested for COVID-19. The opportunity to ask questions was provided.

## 2019-06-14 ENCOUNTER — Ambulatory Visit: Payer: Medicare Other | Attending: Internal Medicine

## 2019-06-14 DIAGNOSIS — Z23 Encounter for immunization: Secondary | ICD-10-CM | POA: Insufficient documentation

## 2019-06-14 NOTE — Progress Notes (Signed)
   Covid-19 Vaccination Clinic  Name:  [name] [name]    MRN: [id number] DOB: [birth year]  06/14/2019  [name] was observed post Covid-19 immunization for 15 minutes without incidence. He was provided with Vaccine Information Sheet and instruction to access the V-Safe system.   [name] was instructed to call 911 with any severe reactions post vaccine: [name] Difficulty breathing  . Swelling of your face and throat  . A fast heartbeat  . A bad rash all over your body  . Dizziness and weakness    Immunizations Administered    Name Date Dose VIS Date Route   Pfizer COVID-19 Vaccine 06/14/2019 12:31 PM 0.3 mL 04/10/2019 Intramuscular   Manufacturer: [place]   Lot: [id number]   [place]: [id number]

## 2019-06-15 ENCOUNTER — Encounter (HOSPITAL_COMMUNITY): Payer: Self-pay | Admitting: Neurosurgery

## 2019-06-15 NOTE — Anesthesia Preprocedure Evaluation (Addendum)
Anesthesia Evaluation  Patient identified by MRN, date of birth, ID band Patient awake    Reviewed: Allergy & Precautions, NPO status , Patient's Chart, lab work & pertinent test results  History of Anesthesia Complications Negative for: history of anesthetic complications  Airway Mallampati: II  TM Distance: >3 FB Neck ROM: Full    Dental no notable dental hx. (+) Dental Advisory Given   Pulmonary neg pulmonary ROS, former smoker,    Pulmonary exam normal        Cardiovascular hypertension, Pt. 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   Last seen by Dr. Haroldine Laws 12/10/18. Per note,  "He returns today for HF follow up.Overall doing fairly well. Can only walk a few hundred feet due to left hip pain. Uses stationary bike for 36mins qOD. No CP or undue SOB. No problems with meds. No dizziness."  Per note by Dr. Haroldine Laws on 06/05/19, "He is at least moderate risk for peri-op CV complications. Ok to hold eliquis for 3 days prior."    Neuro/Psych negative neurological ROS     GI/Hepatic negative GI ROS, Neg liver ROS,   Endo/Other  diabetes  Renal/GU Renal InsufficiencyRenal disease     Musculoskeletal negative musculoskeletal ROS (+)   Abdominal   Peds  Hematology negative hematology ROS (+)   Anesthesia Other Findings Day of surgery medications reviewed with the patient. Will turn AICD of during procedure per device rep recommendation.    Reproductive/Obstetrics                           Anesthesia Physical Anesthesia Plan  ASA: III  Anesthesia Plan: General   Post-op Pain Management:    Induction:  Intravenous  PONV Risk Score and Plan: 3 and Ondansetron, Treatment may vary due to age or medical condition and Midazolam  Airway Management Planned: Oral ETT  Additional Equipment:   Intra-op Plan:   Post-operative Plan: Extubation in OR  Informed Consent: I have reviewed the patients History and Physical, chart, labs and discussed the procedure including the risks, benefits and alternatives for the proposed anesthesia with the patient or authorized representative who has indicated his/her understanding and acceptance.     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   Last seen by Dr. Haroldine Laws 12/10/18. Per note,  "He returns today for HF follow up. Overall doing fairly well. Can only walk a few hundred feet due to left hip pain. Uses stationary bike for 25mins qOD. No CP or undue SOB. No problems with meds. No dizziness."  Per note by Dr. Haroldine Laws on 06/05/19, "He is at least moderate risk for peri-op CV complications. Ok to hold eliquis for 3 days prior."  Periop device for on pt chart.  Preop labs reviewed, creatinine mildly elevated 1.37, DMII reasonably well controlled A1c 7.5. Remainder of labs unremarkable.   EKG 01/02/19: Sinus bradycardia. Rate 51. Low voltage QRS. Possible anterolateral infarct, age undetermined. ST&T wave abnormality, consider inferior ischemia.  TTE 12/10/18: 1. Severe akinesis of the left ventricular, mid-apical inferior wall,  apical segment, anteroseptal wall, anterolateral wall and inferolateral  wall.  2. The left ventricle has severely reduced systolic function, with an  ejection fraction of 25-30%. The cavity size was normal. There is mildly  increased left ventricular wall thickness. Left ventricular diastolic   Doppler parameters are consistent with  impaired relaxation.  3. 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  This is clearly improved from prior study.  4. The right ventricle has normal systolic function. The cavity was  normal. There is no increase in right ventricular wall thickness.  5. Mild thickening of the mitral valve leaflet. There is moderate mitral  annular calcification present.  6. The aortic valve is tricuspid. Mild thickening of the aortic valve.  Mild calcification of the aortic valve. Aortic valve regurgitation was not  assessed by color flow Doppler. Moderate aortic annular calcification  noted.  7. The aorta is normal unless otherwise noted.  )      Anesthesia Quick Evaluation

## 2019-06-15 NOTE — Progress Notes (Signed)
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   Last seen by Dr. Haroldine Laws 12/10/18. Per note,  "He returns today for HF follow up. Overall doing fairly well. Can only walk a few hundred feet due to left hip pain. Uses stationary bike for 74mins qOD. No CP or undue SOB. No problems with meds. No dizziness."  Per note by Dr. Haroldine Laws on 06/05/19, "He is at least moderate risk for peri-op CV complications. Ok to hold eliquis for 3 days prior."  Periop device for on pt chart.  Preop labs reviewed, creatinine mildly elevated 1.37, DMII reasonably well controlled A1c 7.5. Remainder of labs unremarkable.   EKG 01/02/19: Sinus bradycardia. Rate 51. Low voltage QRS. Possible anterolateral infarct, age undetermined. ST&T wave abnormality, consider inferior ischemia.   TTE 12/10/18: 1. Severe akinesis of the left ventricular, mid-apical inferior wall,  apical segment, anteroseptal wall, anterolateral wall and inferolateral  wall.  2. The left ventricle has severely reduced systolic function, with an  ejection fraction of 25-30%. The cavity size was normal. There is mildly  increased left ventricular wall thickness. Left ventricular diastolic  Doppler parameters are consistent with  impaired relaxation.  3. 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  This is clearly improved from prior study.  4. The right ventricle has normal systolic function. The cavity was  normal. There is no increase in right ventricular wall thickness.  5. Mild thickening of the mitral valve leaflet. There is moderate mitral  annular calcification present.  6. The aortic valve is tricuspid. Mild thickening of the aortic valve.  Mild calcification of the aortic valve. Aortic valve regurgitation was not  assessed by color flow Doppler. Moderate  aortic annular calcification  noted.  7. The aorta is normal unless otherwise noted.    [name] [facility] Short Stay Center/Anesthesiology Phone [phone] 06/15/2019 9:30 AM

## 2019-06-16 ENCOUNTER — Ambulatory Visit (HOSPITAL_COMMUNITY): Payer: Medicare Other | Admitting: Certified Registered Nurse Anesthetist

## 2019-06-16 ENCOUNTER — Ambulatory Visit (HOSPITAL_COMMUNITY): Payer: Medicare Other

## 2019-06-16 ENCOUNTER — Other Ambulatory Visit: Payer: Self-pay

## 2019-06-16 ENCOUNTER — Ambulatory Visit (HOSPITAL_COMMUNITY): Payer: Medicare Other | Admitting: Vascular Surgery

## 2019-06-16 ENCOUNTER — Observation Stay (HOSPITAL_COMMUNITY)
Admission: RE | Admit: 2019-06-16 | Discharge: 2019-06-16 | Disposition: A | Discharge status: Home / Self Care | Payer: Medicare Other | Attending: Neurosurgery | Admitting: Neurosurgery

## 2019-06-16 ENCOUNTER — Encounter (HOSPITAL_COMMUNITY)
Admission: RE | Disposition: A | Discharge status: Home / Self Care | Payer: Self-pay | Source: Home / Self Care | Attending: Neurosurgery

## 2019-06-16 ENCOUNTER — Encounter (HOSPITAL_COMMUNITY): Payer: Self-pay | Admitting: Neurosurgery

## 2019-06-16 DIAGNOSIS — Z8551 Personal history of malignant neoplasm of bladder: Secondary | ICD-10-CM | POA: Diagnosis not present

## 2019-06-16 DIAGNOSIS — M1990 Unspecified osteoarthritis, unspecified site: Secondary | ICD-10-CM | POA: Insufficient documentation

## 2019-06-16 DIAGNOSIS — Z8249 Family history of ischemic heart disease and other diseases of the circulatory system: Secondary | ICD-10-CM | POA: Diagnosis not present

## 2019-06-16 DIAGNOSIS — Z87891 Personal history of nicotine dependence: Secondary | ICD-10-CM | POA: Diagnosis not present

## 2019-06-16 DIAGNOSIS — R011 Cardiac murmur, unspecified: Secondary | ICD-10-CM | POA: Diagnosis not present

## 2019-06-16 DIAGNOSIS — I5022 Chronic systolic (congestive) heart failure: Secondary | ICD-10-CM | POA: Insufficient documentation

## 2019-06-16 DIAGNOSIS — Z7901 Long term (current) use of anticoagulants: Secondary | ICD-10-CM | POA: Diagnosis not present

## 2019-06-16 DIAGNOSIS — I480 Paroxysmal atrial fibrillation: Secondary | ICD-10-CM | POA: Insufficient documentation

## 2019-06-16 DIAGNOSIS — I110 Hypertensive heart disease with heart failure: Secondary | ICD-10-CM | POA: Insufficient documentation

## 2019-06-16 DIAGNOSIS — Z79899 Other long term (current) drug therapy: Secondary | ICD-10-CM | POA: Diagnosis not present

## 2019-06-16 DIAGNOSIS — I252 Old myocardial infarction: Secondary | ICD-10-CM | POA: Diagnosis not present

## 2019-06-16 DIAGNOSIS — Z951 Presence of aortocoronary bypass graft: Secondary | ICD-10-CM | POA: Diagnosis not present

## 2019-06-16 DIAGNOSIS — M5126 Other intervertebral disc displacement, lumbar region: Secondary | ICD-10-CM | POA: Diagnosis not present

## 2019-06-16 DIAGNOSIS — Z888 Allergy status to other drugs, medicaments and biological substances status: Secondary | ICD-10-CM | POA: Diagnosis not present

## 2019-06-16 DIAGNOSIS — Z419 Encounter for procedure for purposes other than remedying health state, unspecified: Secondary | ICD-10-CM

## 2019-06-16 DIAGNOSIS — Z95810 Presence of automatic (implantable) cardiac defibrillator: Secondary | ICD-10-CM | POA: Diagnosis not present

## 2019-06-16 DIAGNOSIS — Z7984 Long term (current) use of oral hypoglycemic drugs: Secondary | ICD-10-CM | POA: Insufficient documentation

## 2019-06-16 DIAGNOSIS — I2510 Atherosclerotic heart disease of native coronary artery without angina pectoris: Secondary | ICD-10-CM | POA: Insufficient documentation

## 2019-06-16 DIAGNOSIS — E1151 Type 2 diabetes mellitus with diabetic peripheral angiopathy without gangrene: Secondary | ICD-10-CM | POA: Insufficient documentation

## 2019-06-16 DIAGNOSIS — M48062 Spinal stenosis, lumbar region with neurogenic claudication: Principal | ICD-10-CM | POA: Insufficient documentation

## 2019-06-16 HISTORY — PX: LUMBAR LAMINECTOMY/DECOMPRESSION MICRODISCECTOMY: SHX5026

## 2019-06-16 LAB — GLUCOSE, CAPILLARY
Glucose-Capillary: 132 mg/dL — ABNORMAL HIGH (ref 70–99)
Glucose-Capillary: 136 mg/dL — ABNORMAL HIGH (ref 70–99)
Glucose-Capillary: 154 mg/dL — ABNORMAL HIGH (ref 70–99)

## 2019-06-16 LAB — PROTIME-INR
INR: 1.1 (ref 0.8–1.2)
Prothrombin Time: 14.1 seconds (ref 11.4–15.2)

## 2019-06-16 SURGERY — LUMBAR LAMINECTOMY/DECOMPRESSION MICRODISCECTOMY 1 LEVEL
Anesthesia: General | Site: Spine Lumbar | Laterality: Bilateral

## 2019-06-16 MED ORDER — PROPOFOL 10 MG/ML IV BOLUS
INTRAVENOUS | Status: AC
  Filled 2019-06-16: qty 20

## 2019-06-16 MED ORDER — FENTANYL CITRATE (PF) 250 MCG/5ML IJ SOLN
INTRAMUSCULAR | Status: AC
  Filled 2019-06-16: qty 5

## 2019-06-16 MED ORDER — FENTANYL CITRATE (PF) 100 MCG/2ML IJ SOLN
INTRAMUSCULAR | Status: DC | PRN
  Administered 2019-06-16 (×3): 50 ug via INTRAVENOUS
  Administered 2019-06-16: 100 ug via INTRAVENOUS

## 2019-06-16 MED ORDER — CEFAZOLIN SODIUM-DEXTROSE 2-4 GM/100ML-% IV SOLN
2.0000 g | INTRAVENOUS | Status: AC
  Administered 2019-06-16: 2 g via INTRAVENOUS

## 2019-06-16 MED ORDER — ONDANSETRON HCL 4 MG PO TABS: 4.0000 mg | ORAL_TABLET | Freq: Four times a day (QID) | ORAL | Status: DC | PRN

## 2019-06-16 MED ORDER — SODIUM CHLORIDE 0.9% FLUSH: 3.0000 mL | Freq: Two times a day (BID) | INTRAVENOUS | Status: DC

## 2019-06-16 MED ORDER — SODIUM CHLORIDE 0.9 % IV SOLN: 250.0000 mL | INTRAVENOUS | Status: DC

## 2019-06-16 MED ORDER — THROMBIN 5000 UNITS EX SOLR
CUTANEOUS | Status: DC | PRN
  Administered 2019-06-16 (×2): 5000 [IU] via TOPICAL

## 2019-06-16 MED ORDER — ONDANSETRON HCL 4 MG/2ML IJ SOLN
INTRAMUSCULAR | Status: DC | PRN
  Administered 2019-06-16: 4 mg via INTRAVENOUS

## 2019-06-16 MED ORDER — FENTANYL CITRATE (PF) 100 MCG/2ML IJ SOLN
INTRAMUSCULAR | Status: AC
  Filled 2019-06-16: qty 2

## 2019-06-16 MED ORDER — CYCLOBENZAPRINE HCL 10 MG PO TABS
ORAL_TABLET | ORAL | Status: AC
  Filled 2019-06-16: qty 1

## 2019-06-16 MED ORDER — ETOMIDATE 2 MG/ML IV SOLN
INTRAVENOUS | Status: DC | PRN
  Administered 2019-06-16: 10 mg via INTRAVENOUS

## 2019-06-16 MED ORDER — PHENOL 1.4 % MT LIQD: 1.0000 | OROMUCOSAL | Status: DC | PRN

## 2019-06-16 MED ORDER — CYCLOBENZAPRINE HCL 10 MG PO TABS: 10.0000 mg | ORAL_TABLET | Freq: Three times a day (TID) | ORAL | 0 refills | Status: DC | PRN

## 2019-06-16 MED ORDER — LACTATED RINGERS IV SOLN: INTRAVENOUS | Status: DC

## 2019-06-16 MED ORDER — SUGAMMADEX SODIUM 200 MG/2ML IV SOLN
INTRAVENOUS | Status: DC | PRN
  Administered 2019-06-16: 200 mg via INTRAVENOUS

## 2019-06-16 MED ORDER — CEFAZOLIN SODIUM-DEXTROSE 2-4 GM/100ML-% IV SOLN
INTRAVENOUS | Status: AC
  Filled 2019-06-16: qty 100

## 2019-06-16 MED ORDER — CYCLOBENZAPRINE HCL 10 MG PO TABS
10.0000 mg | ORAL_TABLET | Freq: Three times a day (TID) | ORAL | Status: DC | PRN
  Administered 2019-06-16: 12:00:00 10 mg via ORAL

## 2019-06-16 MED ORDER — METFORMIN HCL ER 500 MG PO TB24
500.0000 mg | ORAL_TABLET | Freq: Every day | ORAL | Status: DC
  Filled 2019-06-16: qty 1

## 2019-06-16 MED ORDER — ATORVASTATIN CALCIUM 80 MG PO TABS: 80.0000 mg | ORAL_TABLET | Freq: Every day | ORAL | Status: DC

## 2019-06-16 MED ORDER — KETOROLAC TROMETHAMINE 15 MG/ML IJ SOLN
30.0000 mg | Freq: Four times a day (QID) | INTRAMUSCULAR | Status: DC
  Administered 2019-06-16: 30 mg via INTRAVENOUS
  Filled 2019-06-16: qty 2

## 2019-06-16 MED ORDER — HYDROCODONE-ACETAMINOPHEN 5-325 MG PO TABS: 1.0000 | ORAL_TABLET | ORAL | 0 refills | Status: DC | PRN

## 2019-06-16 MED ORDER — BUPIVACAINE HCL (PF) 0.25 % IJ SOLN
INTRAMUSCULAR | Status: DC | PRN
  Administered 2019-06-16: 20 mL

## 2019-06-16 MED ORDER — 0.9 % SODIUM CHLORIDE (POUR BTL) OPTIME
TOPICAL | Status: DC | PRN
  Administered 2019-06-16: 1000 mL

## 2019-06-16 MED ORDER — HYDROMORPHONE HCL 1 MG/ML IJ SOLN: 1.0000 mg | INTRAMUSCULAR | Status: DC | PRN

## 2019-06-16 MED ORDER — HYDROCODONE-ACETAMINOPHEN 5-325 MG PO TABS: 1.0000 | ORAL_TABLET | ORAL | Status: DC | PRN

## 2019-06-16 MED ORDER — CANAGLIFLOZIN 100 MG PO TABS
100.0000 mg | ORAL_TABLET | Freq: Every day | ORAL | Status: DC
  Filled 2019-06-16: qty 1

## 2019-06-16 MED ORDER — SPIRONOLACTONE 25 MG PO TABS: 25.0000 mg | ORAL_TABLET | Freq: Every day | ORAL | Status: DC

## 2019-06-16 MED ORDER — FENTANYL CITRATE (PF) 100 MCG/2ML IJ SOLN
25.0000 ug | INTRAMUSCULAR | Status: DC | PRN
  Administered 2019-06-16: 12:00:00 75 ug via INTRAVENOUS

## 2019-06-16 MED ORDER — SACUBITRIL-VALSARTAN 97-103 MG PO TABS
1.0000 | ORAL_TABLET | Freq: Two times a day (BID) | ORAL | Status: DC
  Filled 2019-06-16: qty 1

## 2019-06-16 MED ORDER — THROMBIN 5000 UNITS EX SOLR
CUTANEOUS | Status: AC
  Filled 2019-06-16: qty 10000

## 2019-06-16 MED ORDER — ONDANSETRON HCL 4 MG/2ML IJ SOLN: 4.0000 mg | Freq: Four times a day (QID) | INTRAMUSCULAR | Status: DC | PRN

## 2019-06-16 MED ORDER — HEMOSTATIC AGENTS (NO CHARGE) OPTIME
TOPICAL | Status: DC | PRN
  Administered 2019-06-16: 1 via TOPICAL

## 2019-06-16 MED ORDER — BUPIVACAINE HCL (PF) 0.25 % IJ SOLN
INTRAMUSCULAR | Status: AC
  Filled 2019-06-16: qty 30

## 2019-06-16 MED ORDER — HYDROCODONE-ACETAMINOPHEN 10-325 MG PO TABS
ORAL_TABLET | ORAL | Status: AC
  Filled 2019-06-16: qty 1

## 2019-06-16 MED ORDER — ACETAMINOPHEN 650 MG RE SUPP: 650.0000 mg | RECTAL | Status: DC | PRN

## 2019-06-16 MED ORDER — PROMETHAZINE HCL 25 MG/ML IJ SOLN: 6.2500 mg | INTRAMUSCULAR | Status: DC | PRN

## 2019-06-16 MED ORDER — SODIUM CHLORIDE 0.9% FLUSH: 3.0000 mL | INTRAVENOUS | Status: DC | PRN

## 2019-06-16 MED ORDER — ROCURONIUM BROMIDE 50 MG/5ML IV SOSY
PREFILLED_SYRINGE | INTRAVENOUS | Status: DC | PRN
  Administered 2019-06-16: 30 mg via INTRAVENOUS
  Administered 2019-06-16: 50 mg via INTRAVENOUS

## 2019-06-16 MED ORDER — FENTANYL CITRATE (PF) 100 MCG/2ML IJ SOLN
25.0000 ug | INTRAMUSCULAR | Status: DC | PRN
  Administered 2019-06-16: 12:00:00 25 ug via INTRAVENOUS

## 2019-06-16 MED ORDER — MENTHOL 3 MG MT LOZG: 1.0000 | LOZENGE | OROMUCOSAL | Status: DC | PRN

## 2019-06-16 MED ORDER — INSULIN ASPART 100 UNIT/ML ~~LOC~~ SOLN
0.0000 [IU] | Freq: Three times a day (TID) | SUBCUTANEOUS | Status: DC
  Administered 2019-06-16: 18:00:00 3 [IU] via SUBCUTANEOUS

## 2019-06-16 MED ORDER — EPLERENONE 25 MG PO TABS: 25.0000 mg | ORAL_TABLET | Freq: Every day | ORAL | Status: DC

## 2019-06-16 MED ORDER — CHLORHEXIDINE GLUCONATE CLOTH 2 % EX PADS: 6.0000 | MEDICATED_PAD | Freq: Once | CUTANEOUS | Status: DC

## 2019-06-16 MED ORDER — SODIUM CHLORIDE 0.9 % IV SOLN
INTRAVENOUS | Status: DC | PRN
  Administered 2019-06-16: 10:00:00 500 mL

## 2019-06-16 MED ORDER — ACETAMINOPHEN 325 MG PO TABS: 650.0000 mg | ORAL_TABLET | ORAL | Status: DC | PRN

## 2019-06-16 MED ORDER — HYDROCODONE-ACETAMINOPHEN 10-325 MG PO TABS
1.0000 | ORAL_TABLET | ORAL | Status: DC | PRN
  Administered 2019-06-16 (×2): 1 via ORAL
  Filled 2019-06-16: qty 1

## 2019-06-16 MED ORDER — CARVEDILOL 3.125 MG PO TABS: 3.1250 mg | ORAL_TABLET | Freq: Two times a day (BID) | ORAL | Status: DC

## 2019-06-16 MED ORDER — CEFAZOLIN SODIUM-DEXTROSE 1-4 GM/50ML-% IV SOLN
1.0000 g | Freq: Three times a day (TID) | INTRAVENOUS | Status: DC
  Administered 2019-06-16: 1 g via INTRAVENOUS
  Filled 2019-06-16: qty 50

## 2019-06-16 SURGICAL SUPPLY — 58 items
ADH SKN CLS APL DERMABOND .7 (GAUZE/BANDAGES/DRESSINGS) ×1
APL SKNCLS STERI-STRIP NONHPOA (GAUZE/BANDAGES/DRESSINGS) ×1
BAG DECANTER FOR FLEXI CONT (MISCELLANEOUS) ×3 IMPLANT
BAND INSRT 18 STRL LF DISP RB (MISCELLANEOUS) ×2
BAND RUBBER #18 3X1/16 STRL (MISCELLANEOUS) ×6 IMPLANT
BENZOIN TINCTURE PRP APPL 2/3 (GAUZE/BANDAGES/DRESSINGS) ×3 IMPLANT
BLADE CLIPPER SURG (BLADE) ×2 IMPLANT
BUR CUTTER 7.0 ROUND (BURR) ×3 IMPLANT
CANISTER SUCT 3000ML PPV (MISCELLANEOUS) ×3 IMPLANT
CARTRIDGE OIL MAESTRO DRILL (MISCELLANEOUS) ×1 IMPLANT
CLOSURE WOUND 1/2 X4 (GAUZE/BANDAGES/DRESSINGS) ×1
COVER WAND RF STERILE (DRAPES) ×1 IMPLANT
DECANTER SPIKE VIAL GLASS SM (MISCELLANEOUS) ×3 IMPLANT
DERMABOND ADVANCED (GAUZE/BANDAGES/DRESSINGS) ×2
DERMABOND ADVANCED .7 DNX12 (GAUZE/BANDAGES/DRESSINGS) ×1 IMPLANT
DIFFUSER DRILL AIR PNEUMATIC (MISCELLANEOUS) ×3 IMPLANT
DRAPE HALF SHEET 40X57 (DRAPES) IMPLANT
DRAPE LAPAROTOMY 100X72X124 (DRAPES) ×3 IMPLANT
DRAPE MICROSCOPE LEICA (MISCELLANEOUS) ×3 IMPLANT
DRAPE SURG 17X23 STRL (DRAPES) ×6 IMPLANT
DRSG OPSITE POSTOP 4X6 (GAUZE/BANDAGES/DRESSINGS) ×2 IMPLANT
DURAPREP 26ML APPLICATOR (WOUND CARE) ×3 IMPLANT
ELECT REM PT RETURN 9FT ADLT (ELECTROSURGICAL) ×3
ELECTRODE REM PT RTRN 9FT ADLT (ELECTROSURGICAL) ×1 IMPLANT
GAUZE 4X4 16PLY RFD (DISPOSABLE) IMPLANT
GAUZE SPONGE 4X4 12PLY STRL (GAUZE/BANDAGES/DRESSINGS) ×1 IMPLANT
GLOVE BIO SURGEON STRL SZ 6.5 (GLOVE) ×2 IMPLANT
GLOVE BIO SURGEONS STRL SZ 6.5 (GLOVE) ×1
GLOVE BIOGEL PI IND STRL 6.5 (GLOVE) ×1 IMPLANT
GLOVE BIOGEL PI IND STRL 7.0 (GLOVE) IMPLANT
GLOVE BIOGEL PI IND STRL 7.5 (GLOVE) IMPLANT
GLOVE BIOGEL PI INDICATOR 6.5 (GLOVE) ×2
GLOVE BIOGEL PI INDICATOR 7.0 (GLOVE) ×2
GLOVE BIOGEL PI INDICATOR 7.5 (GLOVE) ×4
GLOVE ECLIPSE 9.0 STRL (GLOVE) ×3 IMPLANT
GLOVE EXAM NITRILE XL STR (GLOVE) IMPLANT
GLOVE SURG SS PI 7.0 STRL IVOR (GLOVE) ×6 IMPLANT
GOWN STRL REUS W/ TWL LRG LVL3 (GOWN DISPOSABLE) IMPLANT
GOWN STRL REUS W/ TWL XL LVL3 (GOWN DISPOSABLE) ×1 IMPLANT
GOWN STRL REUS W/TWL 2XL LVL3 (GOWN DISPOSABLE) IMPLANT
GOWN STRL REUS W/TWL LRG LVL3 (GOWN DISPOSABLE) ×3
GOWN STRL REUS W/TWL XL LVL3 (GOWN DISPOSABLE) ×6
KIT BASIN OR (CUSTOM PROCEDURE TRAY) ×3 IMPLANT
KIT TURNOVER KIT B (KITS) ×3 IMPLANT
NDL SPNL 22GX3.5 QUINCKE BK (NEEDLE) IMPLANT
NEEDLE HYPO 22GX1.5 SAFETY (NEEDLE) ×3 IMPLANT
NEEDLE SPNL 22GX3.5 QUINCKE BK (NEEDLE) ×3 IMPLANT
NS IRRIG 1000ML POUR BTL (IV SOLUTION) ×3 IMPLANT
OIL CARTRIDGE MAESTRO DRILL (MISCELLANEOUS) ×3
PACK LAMINECTOMY NEURO (CUSTOM PROCEDURE TRAY) ×3 IMPLANT
PAD ARMBOARD 7.5X6 YLW CONV (MISCELLANEOUS) ×13 IMPLANT
SPONGE SURGIFOAM ABS GEL SZ50 (HEMOSTASIS) ×3 IMPLANT
STRIP CLOSURE SKIN 1/2X4 (GAUZE/BANDAGES/DRESSINGS) ×2 IMPLANT
SUT VIC AB 2-0 CT1 18 (SUTURE) ×3 IMPLANT
SUT VIC AB 3-0 SH 8-18 (SUTURE) ×3 IMPLANT
TOWEL GREEN STERILE (TOWEL DISPOSABLE) ×3 IMPLANT
TOWEL GREEN STERILE FF (TOWEL DISPOSABLE) ×3 IMPLANT
WATER STERILE IRR 1000ML POUR (IV SOLUTION) ×3 IMPLANT

## 2019-06-16 NOTE — Plan of Care (Signed)
Pt doing well. Pt and wife given D/C instructions with verbal understanding. Rx's were sent to pharmacy by MD. Pt's incision is clean and dry with no sign of infection. Pt's IV was removed prior to D/C. Pt D/C'd home via wheelchair per MD order. Pt is stable @ D/C and has no other needs at this time. Crystale Giannattasio, RN  

## 2019-06-16 NOTE — Discharge Instructions (Signed)
Wound Care Keep incision covered and dry for two days.  If you shower, cover incision with plastic wrap.  Do not put any creams, lotions, or ointments on incision. Leave steri-strips on back.  They will fall off by themselves. Activity Walk each and every day, increasing distance each day. No lifting greater than 5 lbs.  Avoid excessive neck motion. No driving for 2 weeks; may ride as a passenger locally. If provided with back brace, wear when out of bed.  It is not necessary to wear brace in bed. Diet Resume your normal diet.  Return to Work Will be discussed at you follow up appointment. Call Your Doctor If Any of These Occur Redness, drainage, or swelling at the wound.  Temperature greater than 101 degrees. Severe pain not relieved by pain medication. Incision starts to come apart. Follow Up Appt Call today for appointment in 1-2 weeks [id number]) or for problems.  If you have any hardware placed in your spine, you will need an x-ray before your appointment.    RESUME ELIQUIS ON Friday AM

## 2019-06-16 NOTE — Discharge Summary (Signed)
Physician Discharge Summary  Patient ID: [name] [name] MRN: [id number] DOB/AGE: 1945-05-22 74 y.o.  Admit date: 06/16/2019 Discharge date: 06/16/2019  Admission Diagnoses:  Discharge Diagnoses:  Active Problems:   Lumbar stenosis with neurogenic claudication   Discharged Condition: good  Hospital Course: Patient been to the hospital where he underwent uncomplicated lumbar decompression.  Postop [name] [name] doing well.  Preoperative back and lower extremity pain much improved.  Standing ambulating and voiding without difficulty.  Ready for discharge home.  Consults:   Significant Diagnostic Studies:   Treatments:   Discharge Exam: Blood pressure (!) 122/50, pulse 66, temperature 98.4 F (36.9 C), temperature source Oral, resp. rate 16, height 6\' 2"  (1.88 m), weight 97.1 kg, SpO2 95 %. Awake and alert.  Oriented and appropriate.  Motor and sensory function intact.  Wound clean and dry.  Chest and abdomen benign.  Disposition: Discharge disposition: 01-Home or Self Care        Allergies as of 06/16/2019      Reactions   Spironolactone Other (See Comments)   Painful gynecomastia      Medication List    TAKE these medications   atorvastatin 80 MG tablet Commonly known as: LIPITOR Take 1 tablet (80 mg total) by mouth daily.   carvedilol 3.125 MG tablet Commonly known as: COREG Take 1 tablet (3.125 mg total) by mouth 2 (two) times daily.   cyclobenzaprine 10 MG tablet Commonly known as: FLEXERIL Take 1 tablet (10 mg total) by mouth 3 (three) times daily as needed for muscle spasms.   Eliquis 5 MG Tabs tablet Generic drug: apixaban TAKE 1 TABLET TWICE A DAY What changed: how much to take   empagliflozin 10 MG Tabs tablet Commonly known as: JARDIANCE Take 10 mg by mouth daily.   eplerenone 25 MG tablet Commonly known as: INSPRA TAKE 1 TABLET BY MOUTH EVERY DAY   HYDROcodone-acetaminophen 5-325 MG tablet Commonly known as: NORCO/VICODIN Take 1 tablet by mouth  every 4 (four) hours as needed for moderate pain ((score 4 to 6)).   metFORMIN 500 MG 24 hr tablet Commonly known as: GLUCOPHAGE-XR Take 500 mg by mouth daily.   sacubitril-valsartan 97-103 MG Commonly known as: ENTRESTO Take 1 tablet by mouth 2 (two) times daily.        SignedCooper Render Raelie Lohr 06/16/2019, 6:44 PM

## 2019-06-16 NOTE — Op Note (Signed)
Date of procedure: 06/16/2019  Date of dictation: Same  Service: Neurosurgery  Preoperative diagnosis: L4-5 stenosis with neurogenic claudication  Postoperative diagnosis: Same  Procedure Name: Bilateral L4-5 decompressive laminotomies with foraminotomies  Surgeon:Antwine Agosto A.[place] Marchiano, M.D.  Asst. Surgeon: Reinaldo Meeker, NP  Anesthesia: General  Indication: 74 year old male with back and bilateral lower extremity pain consistent with neurogenic claudication and failing conservative management.  Work-up demonstrates evidence of severe stenosis at [id number] complicated by broad-based disc herniation at L4-5 after the right side.  Patient also with severe peripheral vascular disease in both lower extremities.  It is felt that he would benefit from a lumbar decompression and he is currently in the process of being evaluated for lower extremity revascularization.  Operative note: After induction anesthesia, patient additional treatment onto Wilson frame and appropriate padded.  Lumbar region prepped and draped sterilely.  Incision made overlying L4-5.  Dissection performed bilaterally.  Retractor placed.  X-ray taken.  Levels confirmed.  Laminotomies then performed bilaterally using high-speed drill and Kerrison rongeurs to remove the inferior aspect lamina of L4 the medial aspect of L4-5 facet joint and the superior lamina of the L5 lamina bilaterally.  Ligament flavum elevated and resected.  Facet joints and lateral gutters were undercut completing decompression of both the L4 and L5 foramen.  Microscope was brought in field used for microdissection of the spinal canal.  The disc which was significant herniated off to the right side at L4-5.  This contributed significantly to the stenosis.  Thecal sac and nerve roots protected and retracted towards midline.  The spaces incised.  Disposition entered and all loose or obviously degenerative disc.  Including the disc herniation was resected.  This point a very thorough  decompression had been achieved.  There was no evidence of injury to the thecal sac and nerve roots.  Gelfoam was placed over the laminotomy sites.  Wounds and closed in layers with Vicryl sutures.  Steri-Strips and sterile dressing were applied.  No apparent complications.  Patient tolerated the procedure well and he returns to the recovery room postop

## 2019-06-16 NOTE — Brief Op Note (Signed)
06/16/2019  11:06 AM  PATIENT:  [name] [name]  74 y.o. male  PRE-OPERATIVE DIAGNOSIS:  Spinal stenosis of lumbar region with neurogenic claudication  POST-OPERATIVE DIAGNOSIS:  Spinal Stenosis of Lumbar Region with Neurogenic claudication  PROCEDURE:  Procedure(s) with comments: Bilateral Lumbar Four-Five Laminectomy and Foraminotomy (Bilateral) - posterior  SURGEON:  Surgeon(s) and Role:    Earnie Larsson, MD - Primary  PHYSICIAN ASSISTANT:   ASSISTANTSMearl Latin   ANESTHESIA:   general  EBL:  100 mL   BLOOD ADMINISTERED:none  DRAINS: none   LOCAL MEDICATIONS USED:  MARCAINE     SPECIMEN:  No Specimen  DISPOSITION OF SPECIMEN:  N/A  COUNTS:  YES  TOURNIQUET:  * No tourniquets in log *  DICTATION: .Dragon Dictation  PLAN OF CARE: Admit for overnight observation  PATIENT DISPOSITION:  PACU - hemodynamically stable.   Delay start of Pharmacological VTE agent (>24hrs) due to surgical blood loss or risk of bleeding: yes

## 2019-06-16 NOTE — H&P (Signed)
[name] [name] is an 74 y.o. male.   Chief Complaint: Back and bilateral leg pain HPI: 74 year old male with progressive lower back pain with radiation into the posterior aspect of both lower extremities.  Symptoms are aggravated with standing and walking.  Work-up demonstrates evidence of severe stenosis at L4-5 bilaterally.  Patient also with significant peripheral vascular disease and he is awaiting vascular surgery intervention as well.  Patient presents today for lumbar decompressive surgery in hopes of improving his symptoms.  Past Medical History:  Diagnosis Date  . AICD (automatic cardioverter/defibrillator) present   . Arthritis   . Bladder cancer ([place])   . Borderline glaucoma   . CHF (congestive heart failure) ([place])   . Coronary artery disease   . Diabetes mellitus type 2, controlled ([place]) ORAL MED  . Essential hypertension   . Heart murmur   . Hepatitis   . Peripheral vascular disease ([place])   . Pneumonia     Past Surgical History:  Procedure Laterality Date  . CAROTID DUPLEX SCAN  07-26-2010   BILATERAL ICA  STENOSIS 1% - 39%  . CORONARY ARTERY BYPASS GRAFT N/A 04/08/2017   Procedure: CORONARY ARTERY BYPASS GRAFTING (CABG) TIMES TWO USING LEFT INTERNAL MAMMARY ARTERY AND LEFT SAPHENOUS LEG VEIN HARVESTED ENDOSCOPICALLY.  LEG VEIN ALSO HARVESTED FROM THE RIGHT LEG;  Surgeon: [name], MD;  Location: [facility];  Service: Open Heart Surgery;  Laterality: N/A;  . CYSTOSCOPY WITH BIOPSY N/A 08/01/2012   Procedure: CYSTOSCOPY WITH BIOPSY BLADDER BIOPSY   ;  Surgeon: Fredricka Bonine, MD;  Location: [facility];  Service: Urology;  Laterality: N/A;  . FULGURATION OF BLADDER TUMOR N/A 08/01/2012   Procedure: FULGURATION OF BLADDER TUMOR;  Surgeon: Fredricka Bonine, MD;  Location: [facility];  Service: Urology;  Laterality: N/A;  . ICD IMPLANT  11/20/2017  . ICD IMPLANT N/A 11/20/2017   Procedure: ICD IMPLANT;  Surgeon: Deboraha Sprang,  MD;  Location: [facility];  Service: Cardiovascular;  Laterality: N/A;  . LEFT HEART CATH AND CORONARY ANGIOGRAPHY N/A 03/31/2017   Procedure: LEFT HEART CATH AND CORONARY ANGIOGRAPHY;  Surgeon: Leonie Man, MD;  Location: [facility];  Service: Cardiovascular;  Laterality: N/A;  . RIGHT HEART CATH Right 03/31/2017   Procedure: RIGHT HEART CATH;  Surgeon: Leonie Man, MD;  Location: [facility];  Service: Cardiovascular;  Laterality: Right;  . TEE WITHOUT CARDIOVERSION N/A 04/08/2017   Procedure: TRANSESOPHAGEAL ECHOCARDIOGRAM (TEE);  Surgeon: [name], MD;  Location: [facility];  Service: Open Heart Surgery;  Laterality: N/A;  . TRANSURETHRAL RESECTION OF BLADDER TUMOR  05/29/2011   Procedure: TRANSURETHRAL RESECTION OF BLADDER TUMOR (TURBT);  Surgeon: Fredricka Bonine, MD;  Location: [facility];  Service: Urology;  Laterality: N/A;  . VENTRICULAR ASSIST DEVICE INSERTION Right 03/31/2017   Procedure: VENTRICULAR ASSIST DEVICE INSERTION;  Surgeon: Leonie Man, MD;  Location: [facility];  Service: Cardiovascular;  Laterality: Right;    Family History  Problem Relation Age of Onset  . Heart attack Sister   . Heart disease Sister    Social History:  reports that he quit smoking about 2 years ago. His smoking use included cigarettes. He has a 40.00 pack-year smoking history. He has never used smokeless tobacco. He reports current alcohol use. He reports that he does not use drugs.  Allergies:  Allergies  Allergen Reactions  . Spironolactone Other (See Comments)  Painful gynecomastia    Facility-Administered Medications Prior to Admission  Medication Dose Route Frequency Provider Last Rate Last Admin  . methylPREDNISolone acetate (DEPO-MEDROL) injection 80 mg  80 mg Other Once Magnus Sinning, MD       Medications Prior to Admission  Medication Sig Dispense Refill  . atorvastatin (LIPITOR) 80 MG tablet Take 1 tablet (80  mg total) by mouth daily. 30 tablet 3  . carvedilol (COREG) 3.125 MG tablet Take 1 tablet (3.125 mg total) by mouth 2 (two) times daily. 180 tablet 3  . ELIQUIS 5 MG TABS tablet TAKE 1 TABLET TWICE A DAY (Patient taking differently: Take 5 mg by mouth 2 (two) times daily. ) 180 tablet 3  . empagliflozin (JARDIANCE) 10 MG TABS tablet Take 10 mg by mouth daily. 90 tablet 3  . eplerenone (INSPRA) 25 MG tablet TAKE 1 TABLET BY MOUTH EVERY DAY (Patient taking differently: Take 25 mg by mouth daily. ) 90 tablet 1  . metFORMIN (GLUCOPHAGE-XR) 500 MG 24 hr tablet Take 500 mg by mouth daily.     . sacubitril-valsartan (ENTRESTO) 97-103 MG Take 1 tablet by mouth 2 (two) times daily. 180 tablet 3    Results for orders placed or performed during the hospital encounter of 06/16/19 (from the past 48 hour(s))  Glucose, capillary     Status: Abnormal   Collection Time: 06/16/19  8:33 AM  Result Value Ref Range   Glucose-Capillary 132 (H) 70 - 99 mg/dL  PT- INR Day of Surgery     Status: None   Collection Time: 06/16/19  8:41 AM  Result Value Ref Range   Prothrombin Time 14.1 11.4 - 15.2 seconds   INR 1.1 0.8 - 1.2    Comment: (NOTE) INR goal varies based on device and disease states. Performed at [facility], [place] [address], [place], [place] [address]    No results found.  Pertinent items noted in HPI and remainder of comprehensive ROS otherwise negative.  Blood pressure 123/67, pulse 79, temperature 97.7 F (36.5 C), resp. rate 18, height 6\' 2"  (1.88 m), weight 97.1 kg, SpO2 98 %.  Patient is awake and alert.  He is oriented and appropriate.  Speech is fluent.  Judgment insight are intact.  Cranial nerve function normal bilateral.  Motor and sensory examination of the extremities reveals some mild weakness of dorsiflexion bilaterally.  Sensory examination with decrease sensation pinprick light touch in his L5 dermatomes bilateral.  Deep tendon reflexes are hypoactive but symmetric.  Straight  raising is negative.  Gait is antalgic.  Posture is flexed peer examination head ears eyes nose throat is unremarkable.  Chest and abdomen are benign.  Extremities are free from injury or deformity. Assessment/Plan L4-5 stenosis with severe neurogenic claudication.  Plan bilateral L4-5 decompressive laminotomies and foraminotomies.  Risks and benefits of been explained.  Patient wishes to proceed.  Mallie Mussel A Raed Schalk 06/16/2019, 9:22 AM

## 2019-06-16 NOTE — Progress Notes (Signed)
[name] [name], [employer] Device Rep at bedside to turn off ICD. Patient placed on monitor, pads in place.

## 2019-06-16 NOTE — Transfer of Care (Signed)
Immediate Anesthesia Transfer of Care Note  Patient: [name] [name]  Procedure(s) Performed: Bilateral Lumbar Four-Five Laminectomy and Foraminotomy (Bilateral Spine Lumbar)  Patient Location: PACU  Anesthesia Type:General  Level of Consciousness: awake, alert  and oriented  Airway & Oxygen Therapy: Patient Spontanous Breathing  Post-op Assessment: Report given to RN and Post -op Vital signs reviewed and stable  Post vital signs: Reviewed and stable  Last Vitals:  Vitals Value Taken Time  BP 163/84 06/16/19 1116  Temp    Pulse 71 06/16/19 1116  Resp 32 06/16/19 1116  SpO2 97 % 06/16/19 1116  Vitals shown include unvalidated device data.  Last Pain:  Vitals:   06/16/19 0837  PainSc: 0-No pain      Patients Stated Pain Goal: 3 ([id number])  Complications: No apparent anesthesia complications

## 2019-06-16 NOTE — Anesthesia Procedure Notes (Signed)
Procedure Name: Intubation Date/Time: 06/16/2019 9:45 AM Performed by: Babs Bertin, CRNA Pre-anesthesia Checklist: Patient identified, Emergency Drugs available, Suction available and Patient being monitored Patient Re-evaluated:Patient Re-evaluated prior to induction Oxygen Delivery Method: Circle System Utilized Preoxygenation: Pre-oxygenation with 100% oxygen Induction Type: IV induction Ventilation: Mask ventilation without difficulty Laryngoscope Size: Mac and 3 Grade View: Grade I Tube type: Oral Tube size: 7.5 mm Number of attempts: 1 Airway Equipment and Method: Stylet and Oral airway Placement Confirmation: ETT inserted through vocal cords under direct vision,  positive ETCO2 and breath sounds checked- equal and bilateral Secured at: 22 cm Tube secured with: Tape Dental Injury: Teeth and Oropharynx as per pre-operative assessment

## 2019-06-16 NOTE — Anesthesia Postprocedure Evaluation (Signed)
Anesthesia Post Note  Patient: ADEDAYO STOESSEL  Procedure(s) Performed: Bilateral Lumbar Four-Five Laminectomy and Foraminotomy (Bilateral Spine Lumbar)     Patient location during evaluation: PACU Anesthesia Type: General Level of consciousness: sedated Pain management: pain level controlled Vital Signs Assessment: post-procedure vital signs reviewed and stable Respiratory status: spontaneous breathing and respiratory function stable Cardiovascular status: stable Postop Assessment: no apparent nausea or vomiting Anesthetic complications: no    Last Vitals:  Vitals:   06/16/19 1200 06/16/19 1215  BP: (!) 143/76 (!) 157/82  Pulse: 61 (!) 58  Resp: 17 18  Temp:  37 C  SpO2: 98% 99%    Last Pain:  Vitals:   06/16/19 1215  PainSc: 3     LLE Motor Response: Purposeful movement;Responds to commands (06/16/19 1215) LLE Sensation: Full sensation (06/16/19 1215) RLE Motor Response: Purposeful movement;Responds to commands (06/16/19 1215) RLE Sensation: Full sensation (06/16/19 1215)      [place]

## 2019-06-23 ENCOUNTER — Other Ambulatory Visit (HOSPITAL_COMMUNITY): Payer: Self-pay | Admitting: Internal Medicine

## 2019-08-21 ENCOUNTER — Other Ambulatory Visit (HOSPITAL_COMMUNITY): Payer: Self-pay

## 2019-08-21 MED ORDER — EMPAGLIFLOZIN 10 MG PO TABS: 10.0000 mg | ORAL_TABLET | Freq: Every day | ORAL | 3 refills | Status: DC

## 2019-09-08 ENCOUNTER — Ambulatory Visit (INDEPENDENT_AMBULATORY_CARE_PROVIDER_SITE_OTHER): Payer: Medicare Other | Admitting: *Deleted

## 2019-09-08 DIAGNOSIS — I255 Ischemic cardiomyopathy: Secondary | ICD-10-CM

## 2019-09-08 LAB — CUP PACEART REMOTE DEVICE CHECK
Battery Remaining Longevity: 120 mo
Battery Voltage: 3.02 V
Brady Statistic RV Percent Paced: 0.17 %
Date Time Interrogation Session: 20210511022507
HighPow Impedance: 70 Ohm
Implantable Lead Implant Date: 20190724
Implantable Lead Location: 753860
Implantable Pulse Generator Implant Date: 20190724
Lead Channel Impedance Value: 323 Ohm
Lead Channel Impedance Value: 380 Ohm
Lead Channel Pacing Threshold Amplitude: 0.625 V
Lead Channel Pacing Threshold Pulse Width: 0.4 ms
Lead Channel Sensing Intrinsic Amplitude: 13.5 mV
Lead Channel Sensing Intrinsic Amplitude: 13.5 mV
Lead Channel Setting Pacing Amplitude: 2.5 V
Lead Channel Setting Pacing Pulse Width: 0.4 ms
Lead Channel Setting Sensing Sensitivity: 0.3 mV

## 2019-09-09 NOTE — Progress Notes (Signed)
Remote ICD transmission.   

## 2019-09-19 ENCOUNTER — Other Ambulatory Visit (HOSPITAL_COMMUNITY): Payer: Self-pay | Admitting: Internal Medicine

## 2019-10-26 ENCOUNTER — Other Ambulatory Visit (HOSPITAL_COMMUNITY): Payer: Self-pay | Admitting: Internal Medicine

## 2019-11-06 ENCOUNTER — Other Ambulatory Visit (HOSPITAL_COMMUNITY): Payer: Self-pay | Admitting: *Deleted

## 2019-11-06 MED ORDER — ATORVASTATIN CALCIUM 80 MG PO TABS: 80.0000 mg | ORAL_TABLET | Freq: Every day | ORAL | 1 refills | Status: DC

## 2019-12-08 ENCOUNTER — Ambulatory Visit (INDEPENDENT_AMBULATORY_CARE_PROVIDER_SITE_OTHER): Payer: Medicare Other | Admitting: *Deleted

## 2019-12-08 DIAGNOSIS — I255 Ischemic cardiomyopathy: Secondary | ICD-10-CM

## 2019-12-09 LAB — CUP PACEART REMOTE DEVICE CHECK
Battery Remaining Longevity: 117 mo
Battery Voltage: 3 V
Brady Statistic RV Percent Paced: 1.45 %
Date Time Interrogation Session: 20210811022826
HighPow Impedance: 76 Ohm
Implantable Lead Implant Date: 20190724
Implantable Lead Location: 753860
Implantable Pulse Generator Implant Date: 20190724
Lead Channel Impedance Value: 323 Ohm
Lead Channel Impedance Value: 380 Ohm
Lead Channel Pacing Threshold Amplitude: 0.75 V
Lead Channel Pacing Threshold Pulse Width: 0.4 ms
Lead Channel Sensing Intrinsic Amplitude: 14.25 mV
Lead Channel Sensing Intrinsic Amplitude: 14.25 mV
Lead Channel Setting Pacing Amplitude: 2.5 V
Lead Channel Setting Pacing Pulse Width: 0.4 ms
Lead Channel Setting Sensing Sensitivity: 0.3 mV

## 2019-12-10 NOTE — Progress Notes (Signed)
Remote ICD transmission.   

## 2020-01-06 DIAGNOSIS — Z95810 Presence of automatic (implantable) cardiac defibrillator: Secondary | ICD-10-CM | POA: Insufficient documentation

## 2020-01-07 ENCOUNTER — Other Ambulatory Visit: Payer: Self-pay

## 2020-01-07 ENCOUNTER — Telehealth (INDEPENDENT_AMBULATORY_CARE_PROVIDER_SITE_OTHER): Payer: Medicare Other | Admitting: Internal Medicine

## 2020-01-07 ENCOUNTER — Telehealth: Payer: Self-pay

## 2020-01-07 VITALS — BP 125/61 | HR 52 | Wt 201.0 lb

## 2020-01-07 DIAGNOSIS — Z95810 Presence of automatic (implantable) cardiac defibrillator: Secondary | ICD-10-CM

## 2020-01-07 DIAGNOSIS — I255 Ischemic cardiomyopathy: Secondary | ICD-10-CM

## 2020-01-07 NOTE — Telephone Encounter (Signed)
  Patient Consent for Virtual Visit         [name] [name] has provided verbal consent on 01/07/2020 for a virtual visit (video or telephone).   CONSENT FOR VIRTUAL VISIT FOR:  [name] [name]  By participating in this virtual visit I agree to the following:  I hereby voluntarily request, consent and authorize [facility] and its employed or contracted physicians, physician assistants, nurse practitioners or other licensed health care professionals (the Practitioner), to provide me with telemedicine health care services (the "Services") as deemed necessary by the treating Practitioner. I acknowledge and consent to receive the Services by the Practitioner via telemedicine. I understand that the telemedicine visit will involve communicating with the Practitioner through live audiovisual communication technology and the disclosure of certain medical information by electronic transmission. I acknowledge that I have been given the opportunity to request an in-person assessment or other available alternative prior to the telemedicine visit and am voluntarily participating in the telemedicine visit.  I understand that I have the right to withhold or withdraw my consent to the use of telemedicine in the course of my care at any time, without affecting my right to future care or treatment, and that the Practitioner or I may terminate the telemedicine visit at any time. I understand that I have the right to inspect all information obtained and/or recorded in the course of the telemedicine visit and may receive copies of available information for a reasonable fee.  I understand that some of the potential risks of receiving the Services via telemedicine include:  [name] Delay or interruption in medical evaluation due to technological equipment failure or disruption; . Information transmitted may not be sufficient (e.g. poor resolution of images) to allow for appropriate medical decision making by the  Practitioner; and/or  . In rare instances, security protocols could fail, causing a breach of personal health information.  Furthermore, I acknowledge that it is my responsibility to provide information about my medical history, conditions and care that is complete and accurate to the best of my ability. I acknowledge that Practitioner's advice, recommendations, and/or decision may be based on factors not within their control, such as incomplete or inaccurate data provided by me or distortions of diagnostic images or specimens that may result from electronic transmissions. I understand that the practice of medicine is not an exact science and that Practitioner makes no warranties or guarantees regarding treatment outcomes. I acknowledge that a copy of this consent can be made available to me via my patient portal ([facility]), or I can request a printed copy by calling the office of [facility].    I understand that my insurance will be billed for this visit.   I have read or had this consent read to me. . I understand the contents of this consent, which adequately explains the benefits and risks of the Services being provided via telemedicine.  . I have been provided ample opportunity to ask questions regarding this consent and the Services and have had my questions answered to my satisfaction. . I give my informed consent for the services to be provided through the use of telemedicine in my medical care

## 2020-01-07 NOTE — Patient Instructions (Signed)
Medication Instructions:  Your physician recommends that you continue on your current medications as directed. Please refer to the Current Medication list given to you today.  *If you need a refill on your cardiac medications before your next appointment, please call your pharmacy*   Lab Work: CBC and BMET  If you have labs (blood work) drawn today and your tests are completely normal, you will receive your results only by:  [place] (if you have MyChart) OR  A paper copy in the mail If you have any lab test that is abnormal or we need to change your treatment, we will call you to review the results.   Testing/Procedures: None ordered.    Follow-Up: At [facility], you and your health needs are our priority.  As part of our continuing mission to provide you with exceptional heart care, we have created designated Provider Care Teams.  These Care Teams include your primary Cardiologist (physician) and Advanced Practice Providers (APPs -  Physician Assistants and Nurse Practitioners) who all work together to provide you with the care you need, when you need it.  We recommend signing up for the patient portal called "MyChart".  Sign up information is provided on this After Visit Summary.  MyChart is used to connect with patients for Virtual Visits (Telemedicine).  Patients are able to view lab/test results, encounter notes, upcoming appointments, etc.  Non-urgent messages can be sent to your provider as well.   To learn more about what you can do with MyChart, go to [email or web address].    Your next appointment:   6 month(s)  The format for your next appointment:   In Person  Provider:   Virl Axe, MD

## 2020-01-07 NOTE — Progress Notes (Signed)
Electrophysiology TeleHealth Note   Due to national recommendations of social distancing due to COVID 19, an audio/video telehealth visit is felt to be most appropriate for this patient at this time.  See MyChart message from today for the patient's consent to telehealth for [facility].   Date:  01/07/2020   ID:  [name] [name], DOB 1946-02-23, MRN [id number]  Location: patient's home  Provider location: [address] [place]  Evaluation Performed: Follow-up visit  PCP:  Christain Sacramento, MD  Cardiologist:     Electrophysiologist:  SK   Chief Complaint:      History of Present Illness:    [name] [name] is a 74 y.o. male who presents via audio/video conferencing for a telehealth visit today.  Since last being seen in our clinic for followup for ICD implanted for primary prevention 7/19.  Has Ischemic cardiomyopathy having presented 12/18 with anterior MI and shock requiring impella support. Further complicated by sepsis. S/P CABG with postop afib   Concern re chronotropic incompetence, and dyspnea   Going to gym with lack of responsiveness--not sure he has HR response     He was found to have a laminated LV thrombus. He has been treated with apixaban.  Exercise tolerance is limited mostly by his hip.  Since the recent up titration of his Entresto and addition of empagliflozin he has noted no shortness of breath.       DATE TEST EF   12/18 LHC 25% RCA T; LAD 95; Cx 70%  4/19 Echo 25-30% Mod-sev MR  6/19 cMRI 30%   8/20 Echo  25-30%    Date Cr K Hgb  7/19 1.18 4.7 12.5  8/20  1.41 4.9<<5.3 15.7  2/21 1.37 5.2 16.7         the patient reports doong about the same The patient denies chest pain*  nocturnal dyspnea  Orthopnea  or peripheral edema .  There have been no palpitations  lightheadedness or syncope    Shortness of breath and fatigue with exertion    The patient denies symptoms of fevers, chills, cough, or new SOB worrisome  for COVID 19.   Past Medical History:  Diagnosis Date  . AICD (automatic cardioverter/defibrillator) present   . Arthritis   . Bladder cancer ([place])   . Borderline glaucoma   . CHF (congestive heart failure) ([place])   . Coronary artery disease   . Diabetes mellitus type 2, controlled ([place]) ORAL MED  . Essential hypertension   . Heart murmur   . Hepatitis   . Peripheral vascular disease ([place])   . Pneumonia     Past Surgical History:  Procedure Laterality Date  . CAROTID DUPLEX SCAN  07-26-2010   BILATERAL ICA  STENOSIS 1% - 39%  . CORONARY ARTERY BYPASS GRAFT N/A 04/08/2017   Procedure: CORONARY ARTERY BYPASS GRAFTING (CABG) TIMES TWO USING LEFT INTERNAL MAMMARY ARTERY AND LEFT SAPHENOUS LEG VEIN HARVESTED ENDOSCOPICALLY.  LEG VEIN ALSO HARVESTED FROM THE RIGHT LEG;  Surgeon: [name], MD;  Location: [place] [facility];  Service: Open Heart Surgery;  Laterality: N/A;  . CYSTOSCOPY WITH BIOPSY N/A 08/01/2012   Procedure: CYSTOSCOPY WITH BIOPSY BLADDER BIOPSY   ;  Surgeon: Fredricka Bonine, MD;  Location: [facility];  Service: Urology;  Laterality: N/A;  . FULGURATION OF BLADDER TUMOR N/A 08/01/2012   Procedure: FULGURATION OF BLADDER TUMOR;  Surgeon: Fredricka Bonine, MD;  Location: [facility];  Service:  Urology;  Laterality: N/A;  . ICD IMPLANT  11/20/2017  . ICD IMPLANT N/A 11/20/2017   Procedure: ICD IMPLANT;  Surgeon: Deboraha Sprang, MD;  Location: [place] [facility];  Service: Cardiovascular;  Laterality: N/A;  . LEFT HEART CATH AND CORONARY ANGIOGRAPHY N/A 03/31/2017   Procedure: LEFT HEART CATH AND CORONARY ANGIOGRAPHY;  Surgeon: Leonie Man, MD;  Location: [place] [facility];  Service: Cardiovascular;  Laterality: N/A;  . LUMBAR LAMINECTOMY/DECOMPRESSION MICRODISCECTOMY Bilateral 06/16/2019   Procedure: Bilateral Lumbar Four-Five Laminectomy and Foraminotomy;  Surgeon: Earnie Larsson, MD;  Location: [place];  Service: Neurosurgery;  Laterality:  Bilateral;  posterior  . RIGHT HEART CATH Right 03/31/2017   Procedure: RIGHT HEART CATH;  Surgeon: Leonie Man, MD;  Location: [place] [facility];  Service: Cardiovascular;  Laterality: Right;  . TEE WITHOUT CARDIOVERSION N/A 04/08/2017   Procedure: TRANSESOPHAGEAL ECHOCARDIOGRAM (TEE);  Surgeon: [name], MD;  Location: [place];  Service: Open Heart Surgery;  Laterality: N/A;  . TRANSURETHRAL RESECTION OF BLADDER TUMOR  05/29/2011   Procedure: TRANSURETHRAL RESECTION OF BLADDER TUMOR (TURBT);  Surgeon: Fredricka Bonine, MD;  Location: [facility];  Service: Urology;  Laterality: N/A;  . VENTRICULAR ASSIST DEVICE INSERTION Right 03/31/2017   Procedure: VENTRICULAR ASSIST DEVICE INSERTION;  Surgeon: Leonie Man, MD;  Location: [place] [facility];  Service: Cardiovascular;  Laterality: Right;    Current Outpatient Medications  Medication Sig Dispense Refill  . apixaban (ELIQUIS) 5 MG TABS tablet Take 1 tablet (5 mg total) by mouth 2 (two) times daily. Needs appt 180 tablet 3  . atorvastatin (LIPITOR) 80 MG tablet Take 1 tablet (80 mg total) by mouth daily. 90 tablet 1  . carvedilol (COREG) 3.125 MG tablet Take 1 tablet (3.125 mg total) by mouth 2 (two) times daily. 180 tablet 3  . empagliflozin (JARDIANCE) 10 MG TABS tablet Take 10 mg by mouth daily. 90 tablet 3  . ENTRESTO 97-103 MG TAKE 1 TABLET TWICE A DAY -DOSE CHANGE 180 tablet 0  . eplerenone (INSPRA) 25 MG tablet Take 1 tablet (25 mg total) by mouth daily. Needs appt 90 tablet 3  . metFORMIN (GLUCOPHAGE-XR) 500 MG 24 hr tablet Take 500 mg by mouth daily.     . cyclobenzaprine (FLEXERIL) 10 MG tablet Take 1 tablet (10 mg total) by mouth 3 (three) times daily as needed for muscle spasms. 30 tablet 0  . HYDROcodone-acetaminophen (NORCO/VICODIN) 5-325 MG tablet Take 1 tablet by mouth every 4 (four) hours as needed for moderate pain ((score 4 to 6)). 30 tablet 0   Current Facility-Administered Medications    Medication Dose Route Frequency Provider Last Rate Last Admin  . methylPREDNISolone acetate (DEPO-MEDROL) injection 80 mg  80 mg Other Once Magnus Sinning, MD        Allergies:   Spironolactone   Social History:  The patient  reports that he quit smoking about 2 years ago. His smoking use included cigarettes. He has a 40.00 pack-year smoking history. He has never used smokeless tobacco. He reports current alcohol use. He reports that he does not use drugs.   Family History:  The patient's   family history includes Heart attack in his sister; Heart disease in his sister.   ROS:  Please see the history of present illness.   All other systems are personally reviewed and negative.    Exam:    Vital Signs:  BP 125/61   Pulse (!) 52   Wt  201 lb (91.2 kg)   SpO2 96%   BMI 25.81 kg/m      Labs/Other Tests and Data Reviewed:    Recent Labs: 06/12/2019: BUN 21; Creatinine, Ser 1.37; Hemoglobin 16.7; Platelets 227; Potassium 5.2; Sodium 138   Wt Readings from Last 3 Encounters:  01/07/20 201 lb (91.2 kg)  06/16/19 214 lb 1.1 oz (97.1 kg)  06/12/19 214 lb (97.1 kg)     Other studies personally reviewed: Additional studies/ records that were reviewed today include:   Device interrogation 8/21 was reviewed  Atrial fib O/w normal interrogation  ASSESSMENT & PLAN:    Ischemic cardiomyopathy status post CABG  Congestive heart failure-class III  Implantable defibrillator-single-chamber-Medtronic  Atrial fibrillation- recurrent   Laminated clot (still present MRI)  Sinus bradycardia  High Risk Medication Surveillance -epleronone  Hyperkalemia  Polycythemia    Not volume overloaded and optivol flat  Functionally quite limited but efforts to use pulse ox -- didn't happen so have encouraged him to do it, on exercise bike at gym, carrying groceries etc  Recurrent afib, with RVR  Egrams reviewed  On Apixoban  No bleeding continue  Without symptoms of ischemia  Need  to reassess K and Hgb   COVID 19 screen The patient denies symptoms of COVID 19 at this time.  The importance of social distancing was discussed today.  Follow-up:  *10m I  also have reached out to CHF to reestablish  Current medicines are reviewed at length with the patient today.   The patient does not have concerns regarding his medicines.  The following changes were made today:  none  Labs/ tests ordered today include: BMET and CBC  ( didn't talk to him about this ) No orders of the defined types were placed in this encounter.     Patient Risk:  after full review of this patients clinical status, I feel that they are at moderate  risk at this time.  Today, I have spent  13 minutes with the patient with telehealth technology discussing the above.  Signed, Virl Axe, MD  01/07/2020 2:00 PM     [address] [place] [address] [phone] (office) [phone] (fax)

## 2020-01-14 ENCOUNTER — Other Ambulatory Visit: Payer: Medicare Other | Admitting: *Deleted

## 2020-01-14 ENCOUNTER — Other Ambulatory Visit: Payer: Self-pay

## 2020-01-14 DIAGNOSIS — I255 Ischemic cardiomyopathy: Secondary | ICD-10-CM

## 2020-01-14 DIAGNOSIS — Z95810 Presence of automatic (implantable) cardiac defibrillator: Secondary | ICD-10-CM

## 2020-01-14 LAB — CBC
Hematocrit: 50.3 % (ref 37.5–51.0)
Hemoglobin: 16.1 g/dL (ref 13.0–17.7)
MCH: 28.4 pg (ref 26.6–33.0)
MCHC: 32 g/dL (ref 31.5–35.7)
MCV: 89 fL (ref 79–97)
Platelets: 197 10*3/uL (ref 150–450)
RBC: 5.67 x10E6/uL (ref 4.14–5.80)
RDW: 12.9 % (ref 11.6–15.4)
WBC: 8.4 10*3/uL (ref 3.4–10.8)

## 2020-01-14 LAB — BASIC METABOLIC PANEL
BUN/Creatinine Ratio: 15 (ref 10–24)
BUN: 22 mg/dL (ref 8–27)
CO2: 22 mmol/L (ref 20–29)
Calcium: 9.4 mg/dL (ref 8.6–10.2)
Chloride: 102 mmol/L (ref 96–106)
Creatinine, Ser: 1.46 mg/dL — ABNORMAL HIGH (ref 0.76–1.27)
GFR calc Af Amer: 54 mL/min/{1.73_m2} — ABNORMAL LOW (ref >59)
GFR calc non Af Amer: 47 mL/min/{1.73_m2} — ABNORMAL LOW (ref >59)
Glucose: 160 mg/dL — ABNORMAL HIGH (ref 65–99)
Potassium: 5.5 mmol/L — ABNORMAL HIGH (ref 3.5–5.2)
Sodium: 137 mmol/L (ref 134–144)

## 2020-01-15 ENCOUNTER — Telehealth: Payer: Self-pay

## 2020-01-15 DIAGNOSIS — E875 Hyperkalemia: Secondary | ICD-10-CM

## 2020-01-15 NOTE — Telephone Encounter (Signed)
Spoke with pt and advised per Dr Caryl Comes BMET shows elevated K+.  Pt will need repeat BMET Monday, 01/18/2020 per Dr Caryl Comes.  Pt verbalizes understanding and agrees with current plan.

## 2020-01-18 ENCOUNTER — Other Ambulatory Visit: Payer: Medicare Other | Admitting: *Deleted

## 2020-01-18 ENCOUNTER — Other Ambulatory Visit: Payer: Self-pay

## 2020-01-18 LAB — BASIC METABOLIC PANEL
BUN/Creatinine Ratio: 15 (ref 10–24)
BUN: 23 mg/dL (ref 8–27)
CO2: 22 mmol/L (ref 20–29)
Calcium: 9.2 mg/dL (ref 8.6–10.2)
Chloride: 102 mmol/L (ref 96–106)
Creatinine, Ser: 1.49 mg/dL — ABNORMAL HIGH (ref 0.76–1.27)
GFR calc Af Amer: 53 mL/min/{1.73_m2} — ABNORMAL LOW (ref >59)
GFR calc non Af Amer: 46 mL/min/{1.73_m2} — ABNORMAL LOW (ref >59)
Glucose: 182 mg/dL — ABNORMAL HIGH (ref 65–99)
Potassium: 4.7 mmol/L (ref 3.5–5.2)
Sodium: 137 mmol/L (ref 134–144)

## 2020-01-20 ENCOUNTER — Telehealth: Payer: Self-pay

## 2020-01-20 DIAGNOSIS — E875 Hyperkalemia: Secondary | ICD-10-CM

## 2020-01-20 DIAGNOSIS — N289 Disorder of kidney and ureter, unspecified: Secondary | ICD-10-CM

## 2020-01-20 NOTE — Telephone Encounter (Signed)
Spoke with pt and advised per Dr Caryl Comes labs show elevated K+ and mild weakening of kidney function.  Pt advised to stop Inspra and repeat labs on 02/03/2020.  Pt verbalized understanding and agrees with current plan.

## 2020-01-20 NOTE — Telephone Encounter (Signed)
-----   Message from Deboraha Sprang, MD sent at 01/20/2020  4:37 PM EDT ----- Please Inform Patient  Labs are normal x elevated K and mild weakening of renal function  Have him stop inspra and will recheck in 2 weeks   Thanks

## 2020-02-03 ENCOUNTER — Other Ambulatory Visit: Payer: Medicare Other | Admitting: *Deleted

## 2020-02-03 ENCOUNTER — Other Ambulatory Visit: Payer: Self-pay

## 2020-02-03 DIAGNOSIS — N289 Disorder of kidney and ureter, unspecified: Secondary | ICD-10-CM

## 2020-02-03 DIAGNOSIS — E875 Hyperkalemia: Secondary | ICD-10-CM

## 2020-02-03 LAB — BASIC METABOLIC PANEL
BUN/Creatinine Ratio: 14 (ref 10–24)
BUN: 20 mg/dL (ref 8–27)
CO2: 22 mmol/L (ref 20–29)
Calcium: 8.9 mg/dL (ref 8.6–10.2)
Chloride: 103 mmol/L (ref 96–106)
Creatinine, Ser: 1.44 mg/dL — ABNORMAL HIGH (ref 0.76–1.27)
GFR calc Af Amer: 55 mL/min/{1.73_m2} — ABNORMAL LOW (ref >59)
GFR calc non Af Amer: 47 mL/min/{1.73_m2} — ABNORMAL LOW (ref >59)
Glucose: 179 mg/dL — ABNORMAL HIGH (ref 65–99)
Potassium: 5.1 mmol/L (ref 3.5–5.2)
Sodium: 138 mmol/L (ref 134–144)

## 2020-02-22 ENCOUNTER — Encounter (HOSPITAL_COMMUNITY): Payer: Self-pay | Admitting: Internal Medicine

## 2020-02-22 ENCOUNTER — Other Ambulatory Visit: Payer: Self-pay

## 2020-02-22 ENCOUNTER — Ambulatory Visit (HOSPITAL_COMMUNITY)
Admission: RE | Admit: 2020-02-22 | Discharge: 2020-02-22 | Disposition: A | Discharge status: Home / Self Care | Payer: Medicare Other | Source: Ambulatory Visit | Attending: Internal Medicine | Admitting: Internal Medicine

## 2020-02-22 VITALS — BP 138/80 | HR 72 | Wt 209.2 lb

## 2020-02-22 DIAGNOSIS — E875 Hyperkalemia: Secondary | ICD-10-CM | POA: Diagnosis not present

## 2020-02-22 DIAGNOSIS — Z87891 Personal history of nicotine dependence: Secondary | ICD-10-CM | POA: Diagnosis not present

## 2020-02-22 DIAGNOSIS — I2510 Atherosclerotic heart disease of native coronary artery without angina pectoris: Secondary | ICD-10-CM

## 2020-02-22 DIAGNOSIS — Z7901 Long term (current) use of anticoagulants: Secondary | ICD-10-CM | POA: Insufficient documentation

## 2020-02-22 DIAGNOSIS — Z951 Presence of aortocoronary bypass graft: Secondary | ICD-10-CM | POA: Insufficient documentation

## 2020-02-22 DIAGNOSIS — I5042 Chronic combined systolic (congestive) and diastolic (congestive) heart failure: Secondary | ICD-10-CM

## 2020-02-22 DIAGNOSIS — E1151 Type 2 diabetes mellitus with diabetic peripheral angiopathy without gangrene: Secondary | ICD-10-CM | POA: Insufficient documentation

## 2020-02-22 DIAGNOSIS — I236 Thrombosis of atrium, auricular appendage, and ventricle as current complications following acute myocardial infarction: Secondary | ICD-10-CM

## 2020-02-22 DIAGNOSIS — I340 Nonrheumatic mitral (valve) insufficiency: Secondary | ICD-10-CM | POA: Insufficient documentation

## 2020-02-22 DIAGNOSIS — Z79899 Other long term (current) drug therapy: Secondary | ICD-10-CM | POA: Insufficient documentation

## 2020-02-22 DIAGNOSIS — N289 Disorder of kidney and ureter, unspecified: Secondary | ICD-10-CM | POA: Diagnosis not present

## 2020-02-22 DIAGNOSIS — Z7984 Long term (current) use of oral hypoglycemic drugs: Secondary | ICD-10-CM | POA: Insufficient documentation

## 2020-02-22 DIAGNOSIS — Z8551 Personal history of malignant neoplasm of bladder: Secondary | ICD-10-CM | POA: Insufficient documentation

## 2020-02-22 DIAGNOSIS — I110 Hypertensive heart disease with heart failure: Secondary | ICD-10-CM | POA: Insufficient documentation

## 2020-02-22 DIAGNOSIS — I480 Paroxysmal atrial fibrillation: Secondary | ICD-10-CM

## 2020-02-22 LAB — BASIC METABOLIC PANEL
Anion gap: 8 (ref 5–15)
BUN: 23 mg/dL (ref 8–23)
CO2: 22 mmol/L (ref 22–32)
Calcium: 9 mg/dL (ref 8.9–10.3)
Chloride: 106 mmol/L (ref 98–111)
Creatinine, Ser: 1.41 mg/dL — ABNORMAL HIGH (ref 0.61–1.24)
GFR, Estimated: 52 mL/min — ABNORMAL LOW (ref >60)
Glucose, Bld: 135 mg/dL — ABNORMAL HIGH (ref 70–99)
Potassium: 4.9 mmol/L (ref 3.5–5.1)
Sodium: 136 mmol/L (ref 135–145)

## 2020-02-22 NOTE — Addendum Note (Signed)
Encounter addended by: Jolaine Artist, MD on: 02/22/2020 5:45 PM  Actions taken: Level of Service modified, Visit diagnoses modified, Charge Capture section accepted

## 2020-02-22 NOTE — Progress Notes (Signed)
Advanced Heart Failure Clinic Note   Primary Cardiologist: Dr. Haroldine Laws   HPI:  [name] [name] is a 74 y.o. male with h/o DM2, COPD (quit smoking 12/18), severe 3v CAD s/p CABG 78/67/6720, chronic systolic HF due to ICM, EF 25-30% (echo 12/18),  HTN, DM2, h/o bladder CA s/p multiple rounds of BCG therapy, and paroxysmal AF.   Pt admitted in 12/18 with anterior STEMI and developed cardiogenic shock requiring Impella support. Urgent cath showed severe 3v CAD as below. While on impella, pt developed sepsis with WBC ~ 40K, UCx + pseudomonas. Treated for AECOPD and completed ABX therapy. BCx negative. Stabilized and underwent CABG x 2 with LIMA to LAD and SVG to OM on 04/08/2017. Pt transiently required milrinone but weaned prior to discharge. Required amiodarone for brief post op afib, but had no recurrence.   Echo 08/07/17 EF 35-40% laminated apical clot. (Dr.Meloney Feld felt ~ 25-30%) and moderate to severe MR.   Cardiac MRI 09/2017 EF 30%, laminated apical thrombus, normal RV size and function, LGE pattern suggests infarction in LAD territory, unlikely to improve with revascularization  S/p Medtronic ICD with Dr Caryl Comes 11/20/17.  He returns today for HF follow up. Saw Dr. Caryl Comes on 01/07/20 and was in AF with RVR. Potassium also high so Inspra stopped. Dr. Olin Pia note also mentions chronotropic incompetence with exercise. Carvedilol cut back to 3.125 bid. Feels ok. Goes to [place] 3x/week does weight, stationary bike x 30 mins. No CP. Breathing ok. Now gets HR up to 100 with exercise. Before was only 24.    Echo 8/20 EF 25-30% RV ok. Small apical thrombus.   Review of systems complete and found to be negative unless listed in HPI.   [facility] 03/31/2017  Prox RCA lesion is 100% stenosed. Dist RCA lesion is 100% stenosed (retrograde filling from collaterals does not go beyond the distal vessel) -->faint collateral filling to the distal PL and PDA from septal perforators and a diffusely diseased AV groove  circumflex  LAV Groove lesion is 90% stenosed.  Prox LAD-1 lesion is 95% stenosed. Prox LAD-2 lesion is 85% stenosed. Heavily calcified, tandem lesions in very tortuous segment of the vessel.  Prox Cx to Mid Cx lesion is 70% stenosed. Heavily calcified  There is severe left ventricular systolic dysfunction. The left ventricular ejection fraction is less than 25% by visual estimate.  LV end diastolic pressure is severely elevated -prior to Impella insertion  Hemodynamic findings consistent with moderate pulmonary hypertension.  Successful Impella insertion with 3.5-3.6 LPM flow's  Echo 04/01/2017 EF 15-20%  Echo 04/16/17 (post op) Improved to 25-30% on 04/16/17 Echo 08/07/17: EF 35-40%, mod to severe MR  Past Medical History:  Diagnosis Date   AICD (automatic cardioverter/defibrillator) present    Arthritis    Bladder cancer ([place])    Borderline glaucoma    CHF (congestive heart failure) ([facility])    Coronary artery disease    Diabetes mellitus type 2, controlled ([place]) ORAL MED   Essential hypertension    Heart murmur    Hepatitis    Peripheral vascular disease ([facility])    Pneumonia     Current Outpatient Medications  Medication Sig Dispense Refill   apixaban (ELIQUIS) 5 MG TABS tablet Take 1 tablet (5 mg total) by mouth 2 (two) times daily. Needs appt 180 tablet 3   atorvastatin (LIPITOR) 80 MG tablet Take 1 tablet (80 mg total) by mouth daily. 90 tablet 1   carvedilol (COREG) 3.125 MG tablet Take 3.125 mg by mouth. Pt  states takes daily     empagliflozin (JARDIANCE) 10 MG TABS tablet Take 10 mg by mouth daily. 90 tablet 3   ENTRESTO 97-103 MG TAKE 1 TABLET TWICE A DAY -DOSE CHANGE 180 tablet 0   metFORMIN (GLUCOPHAGE-XR) 500 MG 24 hr tablet Take 500 mg by mouth daily.      Current Facility-Administered Medications  Medication Dose Route Frequency Provider Last Rate Last Admin   methylPREDNISolone acetate (DEPO-MEDROL) injection 80 mg  80 mg Other Once Magnus Sinning, MD        Allergies  Allergen Reactions   Spironolactone Other (See Comments)    Painful gynecomastia      Social History   Socioeconomic History   Marital status: Married    Spouse name: Not on file   Number of children: Not on file   Years of education: Not on file   Highest education level: Not on file  Occupational History   Not on file  Tobacco Use   Smoking status: Former Smoker    Packs/day: 1.00    Years: 40.00    Pack years: 40.00    Types: Cigarettes    Quit date: 03/31/2017    Years since quitting: 2.8   Smokeless tobacco: Never Used  Vaping Use   Vaping Use: Never used  Substance and Sexual Activity   Alcohol use: Yes    Comment: rarely   Drug use: No   Sexual activity: Not on file  Other Topics Concern   Not on file  Social History Narrative   Not on file   Social Determinants of Health   Financial Resource Strain:    Difficulty of Paying Living Expenses: Not on file  Food Insecurity:    Worried About Charity fundraiser in the Last Year: Not on file   [employer] of Food in the Last Year: Not on file  Transportation Needs:    Lack of Transportation (Medical): Not on file   Lack of Transportation (Non-Medical): Not on file  Physical Activity:    Days of Exercise per Week: Not on file   Minutes of Exercise per Session: Not on file  Stress:    Feeling of Stress : Not on file  Social Connections:    Frequency of Communication with Friends and Family: Not on file   Frequency of Social Gatherings with Friends and Family: Not on file   Attends Religious Services: Not on file   Active Member of Clubs or Organizations: Not on file   Attends Archivist Meetings: Not on file   Marital Status: Not on file  Intimate Partner Violence:    Fear of Current or Ex-Partner: Not on file   Emotionally Abused: Not on file   Physically Abused: Not on file   Sexually Abused: Not on file   Family History  Problem  Relation Age of Onset   Heart attack Sister    Heart disease Sister     Vitals:   02/22/20 1437  BP: 138/80  Pulse: 72  SpO2: 97%  Weight: 94.9 kg (209 lb 3.2 oz)   Wt Readings from Last 3 Encounters:  02/22/20 94.9 kg (209 lb 3.2 oz)  01/07/20 91.2 kg (201 lb)  06/16/19 97.1 kg (214 lb 1.1 oz)    PHYSICAL EXAM: General:  Well appearing. No resp difficulty HEENT: normal Neck: supple. no JVD. Carotids 2+ bilat; no bruits. No lymphadenopathy or thryomegaly appreciated. Cor: PMI nondisplaced. Regular rate & rhythm. No rubs, gallops or murmurs.  Lungs: clear with diminished BS throughout Abdomen: soft, nontender, nondistended. No hepatosplenomegaly. No bruits or masses. Good bowel sounds. Extremities: no cyanosis, clubbing, rash, edema Neuro: alert & orientedx3, cranial nerves grossly intact. moves all 4 extremities w/o difficulty. Affect pleasant  ASSESSMENT & PLAN:  1. Chronic systolic CHF due to ICM  - EF 04/01/17 EF 15-20% -> Improved to 25-30% on 04/16/17 Post op CABG as below.  - Echo 08/07/17 read as EF 35-40% but Dr Haroldine Laws reviewed personally and EF 25-30% (reviewed with Dr. Meda Coffee who agrees) + laminated apical clot and moderate to severe MR. RV ok.  - Cardiac MRI 09/2017 EF 30%, laminated apical thrombus, normal RV size and function, LGE pattern suggests infarction in LAD territory, unlikely to improve with revascularization - Echo 8/20 EF 25-30% RV ok. Mild MR Small apical thrombus.  - S/p Medtronic ICD with Dr Caryl Comes 11/20/17 - Stable NYHA II  - Volume status stable. Not on lasix  - Continue carvedilol 3.125 bid. (could not tolerate higher dosing due to severe chronotropic incompetence with exercise HR in 80s) - Continue eplerenone 25 mg daily. (painful gynecomastia with spiro) - Continue Entresto 97/103 mg BID.  - Continue Jardiance - ICD interrogated in clinic. No AF/VT. Volume good. Activity 2hr/day. HR variability improved.  - Labs today  2. Mitral  regurgitation - moderate to severe on echo 07/2017 - mild on recent echo  3. CAD - s/p CABG with LIMA to LAD and SVG to OM on 04/08/2017 - No s/s of ischemia - Continue statin, BB. No ASA with Eliquis use.  4. Tobacco abuse: Smoked 1 ppd PTA for > 50 years.  - Remains quit. No change.   5. Hx of bladder CA:  - s/p Multiple rounds of BCG therapy.   6. Paroxysmal Atrial fibrillation - Regular on exam. Off amio.  - Apparently had AF when he saw Dr. Caryl Comes previously but none on device interrogation today - Continue Eliquis. Denies bleeding.   7. LV clot - Laminated, small - On Eliquis for PAF. No change.   8. DM2 - On Lottie Rater, MD 02/22/20

## 2020-02-22 NOTE — Patient Instructions (Signed)
Labs done today, your results will be available in MyChart, we will contact you for abnormal readings.  Please call our office in March 2022 to schedule your follow up appointment and echocardiogram  If you have any questions or concerns before your next appointment please send [place] a message through [place] or call our office at [phone].    TO LEAVE A MESSAGE FOR THE NURSE SELECT OPTION 2, PLEASE LEAVE A MESSAGE INCLUDING: . YOUR NAME . DATE OF BIRTH . CALL BACK NUMBER . REASON FOR CALL**this is important as we prioritize the call backs  [place] AS LONG AS YOU CALL BEFORE 4:00 PM  At the [facility], you and your health needs are our priority. As part of our continuing mission to provide you with exceptional heart care, we have created designated Provider Care Teams. These Care Teams include your primary Cardiologist (physician) and Advanced Practice Providers (APPs- Physician Assistants and Nurse Practitioners) who all work together to provide you with the care you need, when you need it.   You may see any of the following providers on your designated Care Team at your next follow up: [name] Dr Glori Bickers . Dr Loralie Champagne . Darrick Grinder, NP . Lyda Jester, PA . Audry Riles, PharmD   Please be sure to bring in all your medications bottles to every appointment.

## 2020-03-08 ENCOUNTER — Ambulatory Visit (INDEPENDENT_AMBULATORY_CARE_PROVIDER_SITE_OTHER): Payer: Medicare Other

## 2020-03-08 DIAGNOSIS — I255 Ischemic cardiomyopathy: Secondary | ICD-10-CM

## 2020-03-08 LAB — CUP PACEART REMOTE DEVICE CHECK
Battery Remaining Longevity: 114 mo
Battery Voltage: 2.99 V
Brady Statistic RV Percent Paced: 1.55 %
Date Time Interrogation Session: 20211109091809
HighPow Impedance: 68 Ohm
Implantable Lead Implant Date: 20190724
Implantable Lead Location: 753860
Implantable Pulse Generator Implant Date: 20190724
Lead Channel Impedance Value: 266 Ohm
Lead Channel Impedance Value: 380 Ohm
Lead Channel Pacing Threshold Amplitude: 0.75 V
Lead Channel Pacing Threshold Pulse Width: 0.4 ms
Lead Channel Sensing Intrinsic Amplitude: 13.875 mV
Lead Channel Sensing Intrinsic Amplitude: 13.875 mV
Lead Channel Setting Pacing Amplitude: 2.5 V
Lead Channel Setting Pacing Pulse Width: 0.4 ms
Lead Channel Setting Sensing Sensitivity: 0.3 mV

## 2020-03-10 NOTE — Progress Notes (Signed)
Remote ICD transmission.   

## 2020-04-25 ENCOUNTER — Other Ambulatory Visit (HOSPITAL_COMMUNITY): Payer: Self-pay | Admitting: Internal Medicine

## 2020-06-07 ENCOUNTER — Ambulatory Visit (INDEPENDENT_AMBULATORY_CARE_PROVIDER_SITE_OTHER): Payer: Medicare Other

## 2020-06-07 DIAGNOSIS — I255 Ischemic cardiomyopathy: Secondary | ICD-10-CM

## 2020-06-08 LAB — CUP PACEART REMOTE DEVICE CHECK
Battery Remaining Longevity: 110 mo
Battery Voltage: 2.99 V
Brady Statistic RV Percent Paced: 1.75 %
Date Time Interrogation Session: 20220209213327
HighPow Impedance: 75 Ohm
Implantable Lead Implant Date: 20190724
Implantable Lead Location: 753860
Implantable Pulse Generator Implant Date: 20190724
Lead Channel Impedance Value: 323 Ohm
Lead Channel Impedance Value: 342 Ohm
Lead Channel Pacing Threshold Amplitude: 0.625 V
Lead Channel Pacing Threshold Pulse Width: 0.4 ms
Lead Channel Sensing Intrinsic Amplitude: 15.625 mV
Lead Channel Sensing Intrinsic Amplitude: 15.625 mV
Lead Channel Setting Pacing Amplitude: 2.5 V
Lead Channel Setting Pacing Pulse Width: 0.4 ms
Lead Channel Setting Sensing Sensitivity: 0.3 mV

## 2020-06-13 NOTE — Progress Notes (Signed)
Remote ICD transmission.   

## 2020-07-04 ENCOUNTER — Other Ambulatory Visit: Payer: Self-pay

## 2020-07-04 ENCOUNTER — Encounter: Payer: Self-pay | Admitting: Internal Medicine

## 2020-07-04 ENCOUNTER — Ambulatory Visit (INDEPENDENT_AMBULATORY_CARE_PROVIDER_SITE_OTHER): Payer: Medicare Other | Admitting: Internal Medicine

## 2020-07-04 VITALS — BP 120/60 | HR 62 | Ht 74.0 in | Wt 210.0 lb

## 2020-07-04 DIAGNOSIS — I5042 Chronic combined systolic (congestive) and diastolic (congestive) heart failure: Secondary | ICD-10-CM

## 2020-07-04 DIAGNOSIS — Z95810 Presence of automatic (implantable) cardiac defibrillator: Secondary | ICD-10-CM | POA: Diagnosis not present

## 2020-07-04 DIAGNOSIS — I255 Ischemic cardiomyopathy: Secondary | ICD-10-CM

## 2020-07-04 NOTE — Progress Notes (Signed)
Patient Care Team: Christain Sacramento, MD as PCP - General (Family Medicine) Bensimhon, Shaune Pascal, MD as PCP - Advanced Heart Failure (Cardiology)   HPI  [name] [name] is a 75 y.o. male seen in followup for ICD implanted for primary prevention 7/19.  Has Ischemic cardiomyopathy having presented 12/18 with anterior MI and shock requiring impella support.  Further complicated by sepsis.  S/P CABG with postop afib   He was found to have a laminated LV thrombus.  He has been treated with apixaban.  The patient denies chest pain, shortness of breath, nocturnal dyspnea, orthopnea or peripheral edema.  There have been no palpitations, lightheadedness or syncope  Notes some nuisance bleeding      DATE TEST EF   12/18 LHC  25 % RCA T; LAD 95; Cx 70%  4/19 Echo  25-30% Mod-sev MR  6/19 cMRI 30%   8/20 Echo  25-30%    Date Cr K Hgb  7/19 1.18 4.7 12.5  8/20  1.41 4.9<<5.3 15.7  10/21 1.41 4.9 16.1      Records and Results Reviewed   Past Medical History:  Diagnosis Date  . AICD (automatic cardioverter/defibrillator) present   . Arthritis   . Bladder cancer ([place])   . Borderline glaucoma   . CHF (congestive heart failure) ([place])   . Coronary artery disease   . Diabetes mellitus type 2, controlled ([place]) ORAL MED  . Essential hypertension   . Heart murmur   . Hepatitis   . Peripheral vascular disease ([place])   . Pneumonia     Past Surgical History:  Procedure Laterality Date  . CAROTID DUPLEX SCAN  07-26-2010   BILATERAL ICA  STENOSIS 1% - 39%  . CORONARY ARTERY BYPASS GRAFT N/A 04/08/2017   Procedure: CORONARY ARTERY BYPASS GRAFTING (CABG) TIMES TWO USING LEFT INTERNAL MAMMARY ARTERY AND LEFT SAPHENOUS LEG VEIN HARVESTED ENDOSCOPICALLY.  LEG VEIN ALSO HARVESTED FROM THE RIGHT LEG;  Surgeon: [name], MD;  Location: [place] [facility];  Service: Open Heart Surgery;  Laterality: N/A;  . CYSTOSCOPY WITH BIOPSY N/A 08/01/2012   Procedure: CYSTOSCOPY WITH BIOPSY BLADDER BIOPSY   ;   Surgeon: Fredricka Bonine, MD;  Location[facility];  Service: Urology;  Laterality: N/A;  . FULGURATION OF BLADDER TUMOR N/A 08/01/2012   Procedure: FULGURATION OF BLADDER TUMOR;  Surgeon: Fredricka Bonine, MD;  Location[facility];  Service: Urology;  Laterality: N/A;  . ICD IMPLANT  11/20/2017  . ICD IMPLANT N/A 11/20/2017   Procedure: ICD IMPLANT;  Surgeon: Deboraha Sprang, MD;  Location: [place] [facility];  Service: Cardiovascular;  Laterality: N/A;  . LEFT HEART CATH AND CORONARY ANGIOGRAPHY N/A 03/31/2017   Procedure: LEFT HEART CATH AND CORONARY ANGIOGRAPHY;  Surgeon: Leonie Man, MD;  Location: [place] [facility];  Service: Cardiovascular;  Laterality: N/A;  . LUMBAR LAMINECTOMY/DECOMPRESSION MICRODISCECTOMY Bilateral 06/16/2019   Procedure: Bilateral Lumbar Four-Five Laminectomy and Foraminotomy;  Surgeon: Earnie Larsson, MD;  Location: [place];  Service: Neurosurgery;  Laterality: Bilateral;  posterior  . RIGHT HEART CATH Right 03/31/2017   Procedure: RIGHT HEART CATH;  Surgeon: Leonie Man, MD;  Location: [place] [facility];  Service: Cardiovascular;  Laterality: Right;  . TEE WITHOUT CARDIOVERSION N/A 04/08/2017   Procedure: TRANSESOPHAGEAL ECHOCARDIOGRAM (TEE);  Surgeon: [name], MD;  Location: [place];  Service: Open Heart Surgery;  Laterality: N/A;  . TRANSURETHRAL RESECTION OF BLADDER TUMOR  05/29/2011   Procedure: TRANSURETHRAL RESECTION OF BLADDER TUMOR (TURBT);  Surgeon: Fredricka Bonine, MD;  Location: [facility];  Service: Urology;  Laterality: N/A;  . VENTRICULAR ASSIST DEVICE INSERTION Right 03/31/2017   Procedure: VENTRICULAR ASSIST DEVICE INSERTION;  Surgeon: Leonie Man, MD;  Location: [place] [facility];  Service: Cardiovascular;  Laterality: Right;    Current Meds  Medication Sig  . apixaban (ELIQUIS) 5 MG TABS tablet Take 1 tablet (5 mg total) by mouth 2 (two) times daily. Needs appt   . atorvastatin (LIPITOR) 80 MG tablet Take 1 tablet (80 mg total) by mouth daily.  . carvedilol (COREG) 3.125 MG tablet Take 3.125 mg by mouth daily.  . empagliflozin (JARDIANCE) 10 MG TABS tablet Take 10 mg by mouth daily.  [name] ENTRESTO 97-103 MG TAKE 1 TABLET TWICE A DAY -DOSE CHANGE  . metFORMIN (GLUCOPHAGE-XR) 500 MG 24 hr tablet Take 500 mg by mouth daily.    Current Facility-Administered Medications for the 07/04/20 encounter (Office Visit) with Deboraha Sprang, MD  Medication  . methylPREDNISolone acetate (DEPO-MEDROL) injection 80 mg    Allergies  Allergen Reactions  . Spironolactone Other (See Comments)    Painful gynecomastia      Review of Systems negative except from HPI and PMH  Physical Exam BP 120/60 (BP Location: Left Arm, Patient Position: Sitting, Cuff Size: Normal)   Pulse 62   Ht 6\' 2"  (1.88 m)   Wt 210 lb (95.3 kg)   SpO2 95%   BMI 26.96 kg/m  Well developed and well nourished in no acute distress HENT normal Neck supple with JVP-flat Clear Device pocket well healed; without hematoma or erythema.  There is no tethering  Regular rate and rhythm, no   murmur Abd-soft with active BS No Clubbing cyanosis  edema Skin-warm and dry A & Oriented  Grossly normal sensory and motor function  ECG sinus at 62 Intervals 19/10/41 CUBII variability   Device interrogation  interval AFib SCAF < 2hr Far field electrograms support A. fib flutter  Assessment and  Plan Ischemic cardiomyopathy status post CABG  Congestive heart failure-class II  Implantable defibrillator-single-chamber-[employer]tronic  Atrial fibrillation-paroxysmal   Laminated clot (still present MRI)  Sinus bradycardia  Dyspnea is better Euvolemic continue current meds  Without symptoms of ischemia  Some nuisance bleeding.  Wonder whether it is appropriate to think about discontinuing his Eliquis.  Will reach out to Dr. Reine Just.  Joycelyn Schmid is not sufficiently long to justify on its own   Virl Axe

## 2020-07-04 NOTE — Patient Instructions (Signed)

## 2020-07-07 ENCOUNTER — Encounter: Payer: Medicare Other | Admitting: Internal Medicine

## 2020-08-17 ENCOUNTER — Encounter (HOSPITAL_COMMUNITY): Payer: Self-pay

## 2020-08-17 NOTE — Telephone Encounter (Signed)
With the problem happening 4 times in 8 months, I think it is unlikely to be a medication problem. It would more likely be diet related. The one most likely to be causing this is the metformin. However, he has been on this medication since at least 2018 with no concerns, so unlikely.  Additionally, metformin would be more likely to cause GI upset, rather than these severe burning episodes, although it can cause dyspepsia. I think further workup by PCP would be warranted to address if this could be diet related and if lifestyle modifications would be helpful or if he should try a PPI or H2 antagonist.

## 2020-08-19 ENCOUNTER — Encounter (HOSPITAL_COMMUNITY): Payer: Self-pay

## 2020-08-19 ENCOUNTER — Inpatient Hospital Stay (HOSPITAL_COMMUNITY): Payer: Medicare Other

## 2020-08-19 ENCOUNTER — Emergency Department (HOSPITAL_COMMUNITY): Payer: Medicare Other

## 2020-08-19 ENCOUNTER — Inpatient Hospital Stay (HOSPITAL_COMMUNITY)
Admission: EM | Admit: 2020-08-19 | Discharge: 2020-08-22 | DRG: 418 | Disposition: A | Discharge status: Home / Self Care | Payer: Medicare Other | Attending: Internal Medicine | Admitting: Internal Medicine

## 2020-08-19 ENCOUNTER — Other Ambulatory Visit: Payer: Self-pay

## 2020-08-19 DIAGNOSIS — I255 Ischemic cardiomyopathy: Secondary | ICD-10-CM

## 2020-08-19 DIAGNOSIS — Z79899 Other long term (current) drug therapy: Secondary | ICD-10-CM

## 2020-08-19 DIAGNOSIS — Z888 Allergy status to other drugs, medicaments and biological substances status: Secondary | ICD-10-CM

## 2020-08-19 DIAGNOSIS — I480 Paroxysmal atrial fibrillation: Secondary | ICD-10-CM | POA: Diagnosis present

## 2020-08-19 DIAGNOSIS — N183 Chronic kidney disease, stage 3 unspecified: Secondary | ICD-10-CM | POA: Diagnosis not present

## 2020-08-19 DIAGNOSIS — I252 Old myocardial infarction: Secondary | ICD-10-CM | POA: Diagnosis not present

## 2020-08-19 DIAGNOSIS — K8020 Calculus of gallbladder without cholecystitis without obstruction: Secondary | ICD-10-CM

## 2020-08-19 DIAGNOSIS — E1151 Type 2 diabetes mellitus with diabetic peripheral angiopathy without gangrene: Secondary | ICD-10-CM | POA: Diagnosis present

## 2020-08-19 DIAGNOSIS — Z981 Arthrodesis status: Secondary | ICD-10-CM | POA: Diagnosis not present

## 2020-08-19 DIAGNOSIS — N1832 Chronic kidney disease, stage 3b: Secondary | ICD-10-CM | POA: Diagnosis present

## 2020-08-19 DIAGNOSIS — R778 Other specified abnormalities of plasma proteins: Secondary | ICD-10-CM | POA: Diagnosis present

## 2020-08-19 DIAGNOSIS — N1831 Chronic kidney disease, stage 3a: Secondary | ICD-10-CM | POA: Diagnosis present

## 2020-08-19 DIAGNOSIS — Z8249 Family history of ischemic heart disease and other diseases of the circulatory system: Secondary | ICD-10-CM

## 2020-08-19 DIAGNOSIS — Z7901 Long term (current) use of anticoagulants: Secondary | ICD-10-CM | POA: Diagnosis not present

## 2020-08-19 DIAGNOSIS — E875 Hyperkalemia: Secondary | ICD-10-CM | POA: Diagnosis present

## 2020-08-19 DIAGNOSIS — Z87891 Personal history of nicotine dependence: Secondary | ICD-10-CM | POA: Diagnosis not present

## 2020-08-19 DIAGNOSIS — Z95810 Presence of automatic (implantable) cardiac defibrillator: Secondary | ICD-10-CM | POA: Diagnosis not present

## 2020-08-19 DIAGNOSIS — Z951 Presence of aortocoronary bypass graft: Secondary | ICD-10-CM | POA: Diagnosis not present

## 2020-08-19 DIAGNOSIS — Z20822 Contact with and (suspected) exposure to covid-19: Secondary | ICD-10-CM | POA: Diagnosis present

## 2020-08-19 DIAGNOSIS — I5042 Chronic combined systolic (congestive) and diastolic (congestive) heart failure: Secondary | ICD-10-CM | POA: Diagnosis present

## 2020-08-19 DIAGNOSIS — Z7984 Long term (current) use of oral hypoglycemic drugs: Secondary | ICD-10-CM

## 2020-08-19 DIAGNOSIS — I5022 Chronic systolic (congestive) heart failure: Secondary | ICD-10-CM

## 2020-08-19 DIAGNOSIS — K8070 Calculus of gallbladder and bile duct without cholecystitis without obstruction: Secondary | ICD-10-CM | POA: Diagnosis not present

## 2020-08-19 DIAGNOSIS — Z01810 Encounter for preprocedural cardiovascular examination: Secondary | ICD-10-CM

## 2020-08-19 DIAGNOSIS — N179 Acute kidney failure, unspecified: Secondary | ICD-10-CM | POA: Diagnosis present

## 2020-08-19 DIAGNOSIS — I130 Hypertensive heart and chronic kidney disease with heart failure and stage 1 through stage 4 chronic kidney disease, or unspecified chronic kidney disease: Secondary | ICD-10-CM | POA: Diagnosis present

## 2020-08-19 DIAGNOSIS — E1122 Type 2 diabetes mellitus with diabetic chronic kidney disease: Secondary | ICD-10-CM | POA: Diagnosis present

## 2020-08-19 DIAGNOSIS — R7989 Other specified abnormal findings of blood chemistry: Secondary | ICD-10-CM | POA: Diagnosis present

## 2020-08-19 DIAGNOSIS — R52 Pain, unspecified: Secondary | ICD-10-CM

## 2020-08-19 DIAGNOSIS — E119 Type 2 diabetes mellitus without complications: Secondary | ICD-10-CM

## 2020-08-19 DIAGNOSIS — K8065 Calculus of gallbladder and bile duct with chronic cholecystitis with obstruction: Secondary | ICD-10-CM | POA: Diagnosis present

## 2020-08-19 DIAGNOSIS — I10 Essential (primary) hypertension: Secondary | ICD-10-CM | POA: Diagnosis present

## 2020-08-19 DIAGNOSIS — I2510 Atherosclerotic heart disease of native coronary artery without angina pectoris: Secondary | ICD-10-CM | POA: Diagnosis present

## 2020-08-19 HISTORY — DX: Chronic systolic (congestive) heart failure: I50.22

## 2020-08-19 HISTORY — DX: Other specified health status: Z78.9

## 2020-08-19 LAB — CBC
HCT: 52.6 % — ABNORMAL HIGH (ref 39.0–52.0)
Hemoglobin: 16.8 g/dL (ref 13.0–17.0)
MCH: 29.2 pg (ref 26.0–34.0)
MCHC: 31.9 g/dL (ref 30.0–36.0)
MCV: 91.3 fL (ref 80.0–100.0)
Platelets: 187 10*3/uL (ref 150–400)
RBC: 5.76 MIL/uL (ref 4.22–5.81)
RDW: 15.5 % (ref 11.5–15.5)
WBC: 7.9 10*3/uL (ref 4.0–10.5)
nRBC: 0 % (ref 0.0–0.2)

## 2020-08-19 LAB — GLUCOSE, CAPILLARY
Glucose-Capillary: 129 mg/dL — ABNORMAL HIGH (ref 70–99)
Glucose-Capillary: 103 mg/dL — ABNORMAL HIGH (ref 70–99)

## 2020-08-19 LAB — COMPREHENSIVE METABOLIC PANEL
ALT: 94 U/L — ABNORMAL HIGH (ref 0–44)
AST: 57 U/L — ABNORMAL HIGH (ref 15–41)
Albumin: 3.4 g/dL — ABNORMAL LOW (ref 3.5–5.0)
Alkaline Phosphatase: 310 U/L — ABNORMAL HIGH (ref 38–126)
Anion gap: 8 (ref 5–15)
BUN: 26 mg/dL — ABNORMAL HIGH (ref 8–23)
CO2: 23 mmol/L (ref 22–32)
Calcium: 9.4 mg/dL (ref 8.9–10.3)
Chloride: 107 mmol/L (ref 98–111)
Creatinine, Ser: 1.87 mg/dL — ABNORMAL HIGH (ref 0.61–1.24)
GFR, Estimated: 37 mL/min — ABNORMAL LOW (ref >60)
Glucose, Bld: 157 mg/dL — ABNORMAL HIGH (ref 70–99)
Potassium: 4.8 mmol/L (ref 3.5–5.1)
Sodium: 138 mmol/L (ref 135–145)
Total Bilirubin: 1.4 mg/dL — ABNORMAL HIGH (ref 0.3–1.2)
Total Protein: 6.8 g/dL (ref 6.5–8.1)

## 2020-08-19 LAB — URINALYSIS, ROUTINE W REFLEX MICROSCOPIC
Bacteria, UA: NONE SEEN
Bilirubin Urine: NEGATIVE
Glucose, UA: >=500 mg/dL — AB
Hgb urine dipstick: NEGATIVE
Ketones, ur: NEGATIVE mg/dL
Leukocytes,Ua: NEGATIVE
Nitrite: NEGATIVE
Protein, ur: 30 mg/dL — AB
Specific Gravity, Urine: 1.015 (ref 1.005–1.030)
pH: 5 (ref 5.0–8.0)

## 2020-08-19 LAB — RESP PANEL BY RT-PCR (FLU A&B, COVID) ARPGX2
Influenza A by PCR: NEGATIVE
Influenza B by PCR: NEGATIVE
SARS Coronavirus 2 by RT PCR: NEGATIVE

## 2020-08-19 LAB — HEMOGLOBIN A1C
Hgb A1c MFr Bld: 8 % — ABNORMAL HIGH (ref 4.8–5.6)
Mean Plasma Glucose: 182.9 mg/dL

## 2020-08-19 LAB — PROTIME-INR
INR: 1 (ref 0.8–1.2)
Prothrombin Time: 13.4 seconds (ref 11.4–15.2)

## 2020-08-19 LAB — LIPASE, BLOOD: Lipase: 54 U/L — ABNORMAL HIGH (ref 11–51)

## 2020-08-19 MED ORDER — CARVEDILOL 3.125 MG PO TABS
3.1250 mg | ORAL_TABLET | Freq: Two times a day (BID) | ORAL | Status: DC
  Administered 2020-08-20 – 2020-08-22 (×4): 3.125 mg via ORAL
  Filled 2020-08-19 (×6): qty 1

## 2020-08-19 MED ORDER — LACTATED RINGERS IV SOLN: INTRAVENOUS | Status: DC

## 2020-08-19 MED ORDER — DOCUSATE SODIUM 100 MG PO CAPS
100.0000 mg | ORAL_CAPSULE | Freq: Two times a day (BID) | ORAL | Status: DC
  Administered 2020-08-19 – 2020-08-22 (×4): 100 mg via ORAL
  Filled 2020-08-19 (×5): qty 1

## 2020-08-19 MED ORDER — HYDROCODONE-ACETAMINOPHEN 5-325 MG PO TABS
1.0000 | ORAL_TABLET | ORAL | Status: DC | PRN
  Administered 2020-08-22: 1 via ORAL
  Filled 2020-08-19: qty 1

## 2020-08-19 MED ORDER — ACETAMINOPHEN 650 MG RE SUPP: 650.0000 mg | Freq: Four times a day (QID) | RECTAL | Status: DC | PRN

## 2020-08-19 MED ORDER — BISACODYL 5 MG PO TBEC
5.0000 mg | DELAYED_RELEASE_TABLET | Freq: Every day | ORAL | Status: DC | PRN
Start: 2020-08-19 — End: 2020-08-22

## 2020-08-19 MED ORDER — HYDRALAZINE HCL 20 MG/ML IJ SOLN
5.0000 mg | INTRAMUSCULAR | Status: DC | PRN
  Administered 2020-08-21: 5 mg via INTRAVENOUS
  Filled 2020-08-19: qty 1

## 2020-08-19 MED ORDER — HEPARIN (PORCINE) 25000 UT/250ML-% IV SOLN
850.0000 [IU]/h | INTRAVENOUS | Status: DC
  Administered 2020-08-19: 1350 [IU]/h via INTRAVENOUS
  Administered 2020-08-20: 1050 [IU]/h via INTRAVENOUS
  Filled 2020-08-19 (×2): qty 250

## 2020-08-19 MED ORDER — PANTOPRAZOLE SODIUM 40 MG PO TBEC
40.0000 mg | DELAYED_RELEASE_TABLET | Freq: Every day | ORAL | Status: DC
  Administered 2020-08-21 – 2020-08-22 (×2): 40 mg via ORAL
  Filled 2020-08-19 (×3): qty 1

## 2020-08-19 MED ORDER — POLYETHYLENE GLYCOL 3350 17 G PO PACK: 17.0000 g | PACK | Freq: Every day | ORAL | Status: DC | PRN

## 2020-08-19 MED ORDER — ONDANSETRON HCL 4 MG PO TABS
4.0000 mg | ORAL_TABLET | Freq: Four times a day (QID) | ORAL | Status: DC | PRN
Start: 2020-08-19 — End: 2020-08-22

## 2020-08-19 MED ORDER — INSULIN ASPART 100 UNIT/ML ~~LOC~~ SOLN
0.0000 [IU] | Freq: Three times a day (TID) | SUBCUTANEOUS | Status: DC
  Administered 2020-08-20: 5 [IU] via SUBCUTANEOUS
  Administered 2020-08-21 (×2): 2 [IU] via SUBCUTANEOUS
  Administered 2020-08-21: 8 [IU] via SUBCUTANEOUS
  Administered 2020-08-22: 3 [IU] via SUBCUTANEOUS

## 2020-08-19 MED ORDER — SALINE SPRAY 0.65 % NA SOLN
1.0000 | NASAL | Status: DC | PRN
  Administered 2020-08-19: 1 via NASAL
  Filled 2020-08-19: qty 44

## 2020-08-19 MED ORDER — SACUBITRIL-VALSARTAN 97-103 MG PO TABS
1.0000 | ORAL_TABLET | Freq: Two times a day (BID) | ORAL | Status: DC
  Administered 2020-08-19: 1 via ORAL
  Filled 2020-08-19 (×4): qty 1

## 2020-08-19 MED ORDER — MORPHINE SULFATE (PF) 2 MG/ML IV SOLN: 2.0000 mg | INTRAVENOUS | Status: DC | PRN

## 2020-08-19 MED ORDER — ATORVASTATIN CALCIUM 80 MG PO TABS
80.0000 mg | ORAL_TABLET | Freq: Every day | ORAL | Status: DC
  Administered 2020-08-22: 80 mg via ORAL
  Filled 2020-08-19 (×2): qty 1

## 2020-08-19 MED ORDER — SODIUM CHLORIDE 0.9 % IV SOLN: Freq: Once | INTRAVENOUS | Status: AC

## 2020-08-19 MED ORDER — GADOBUTROL 1 MMOL/ML IV SOLN
8.5000 mL | Freq: Once | INTRAVENOUS | Status: AC | PRN
  Administered 2020-08-19: 8.5 mL via INTRAVENOUS

## 2020-08-19 MED ORDER — ACETAMINOPHEN 325 MG PO TABS: 650.0000 mg | ORAL_TABLET | Freq: Four times a day (QID) | ORAL | Status: DC | PRN

## 2020-08-19 MED ORDER — ONDANSETRON HCL 4 MG/2ML IJ SOLN: 4.0000 mg | Freq: Four times a day (QID) | INTRAMUSCULAR | Status: DC | PRN

## 2020-08-19 NOTE — ED Provider Notes (Signed)
[facility] EMERGENCY DEPARTMENT Provider Note   CSN: [id number] Arrival date & time: 08/19/20  [id number]     History Chief Complaint  Patient presents with  . Abdominal Pain    [name] [name] is a 75 y.o. male.  Patient here after abnormal lab work at primary care doctor's office several days ago.  On Sunday, 5 days ago he developed some right upper quadrant abdominal pain that lasted about 12 to 16 hours.  Resolved on its own.  He had some nausea afterwards but since he has been feeling better.  Been eaten without too much issues.  He noticed that he was having some pain like that in the weeks before especially when eating.  States that his bilirubin was elevated and was told to come for evaluation.  Not having any pain, nausea currently.  The history is provided by the patient.  Abdominal Pain Pain location:  RUQ Pain quality: aching   Pain severity:  No pain Timing:  Intermittent Progression:  Waxing and waning Chronicity:  New Associated symptoms: nausea   Associated symptoms: no chest pain, no chills, no cough, no dysuria, no fever, no hematuria, no shortness of breath, no sore throat and no vomiting   Risk factors: has not had multiple surgeries        Past Medical History:  Diagnosis Date  . AICD (automatic cardioverter/defibrillator) present   . Arthritis   . Bladder cancer ([place])   . Borderline glaucoma   . CHF (congestive heart failure) ([place])   . Coronary artery disease   . Diabetes mellitus type 2, controlled ([place]) ORAL MED  . Essential hypertension   . Heart murmur   . Hepatitis   . Peripheral vascular disease ([place])   . Pneumonia     Patient Active Problem List   Diagnosis Date Noted  . 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  . 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  . Ischemic cardiomyopathy 11/20/2017  . CAD (coronary artery disease) 05/10/2017  . Chronic combined systolic and diastolic CHF  (congestive heart failure) ([place]) 05/10/2017  . CKD (chronic kidney disease), stage III ([place]) 05/10/2017  . Acute epididymitis 05/10/2017  . Left epididymitis   . Orchitis, right   . 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 ST elevation myocardial infarction (STEMI) of anterior wall ([place]) 03/31/2017  . STEMI (ST elevation myocardial infarction) ([place]) 03/31/2017  . Essential hypertension   . Diabetes mellitus type 2, controlled ([place])     Past Surgical History:  Procedure Laterality Date  . CAROTID DUPLEX SCAN  07-26-2010   BILATERAL ICA  STENOSIS 1% - 39%  . CORONARY ARTERY BYPASS GRAFT N/A 04/08/2017   Procedure: CORONARY ARTERY BYPASS GRAFTING (CABG) TIMES TWO USING LEFT INTERNAL MAMMARY ARTERY AND LEFT SAPHENOUS LEG VEIN HARVESTED ENDOSCOPICALLY.  LEG VEIN ALSO HARVESTED FROM THE RIGHT LEG;  Surgeon: [name], MD;  Location: [place] [facility];  Service: Open Heart Surgery;  Laterality: N/A;  . CYSTOSCOPY WITH BIOPSY N/A 08/01/2012   Procedure: CYSTOSCOPY WITH BIOPSY BLADDER BIOPSY   ;  Surgeon: Fredricka Bonine, MD;  Location[facility];  Service: Urology;  Laterality: N/A;  . FULGURATION OF BLADDER TUMOR N/A 08/01/2012   Procedure: FULGURATION OF BLADDER TUMOR;  Surgeon: Fredricka Bonine, MD;  Location: [facility];  Service: Urology;  Laterality: N/A;  . ICD IMPLANT  11/20/2017  . ICD IMPLANT N/A 11/20/2017   Procedure: ICD IMPLANT;  Surgeon: Deboraha Sprang, MD;  Location: [place] [facility];  Service: Cardiovascular;  Laterality: N/A;  . LEFT HEART CATH AND CORONARY ANGIOGRAPHY N/A 03/31/2017   Procedure: LEFT HEART CATH AND CORONARY ANGIOGRAPHY;  Surgeon: Leonie Man, MD;  Location: [place] [facility];  Service: Cardiovascular;  Laterality: N/A;  . LUMBAR LAMINECTOMY/DECOMPRESSION MICRODISCECTOMY Bilateral 06/16/2019   Procedure: Bilateral Lumbar Four-Five Laminectomy and Foraminotomy;  Surgeon: Earnie Larsson, MD;  Location: [place];  Service: Neurosurgery;   Laterality: Bilateral;  posterior  . RIGHT HEART CATH Right 03/31/2017   Procedure: RIGHT HEART CATH;  Surgeon: Leonie Man, MD;  Location: [place] [facility];  Service: Cardiovascular;  Laterality: Right;  . TEE WITHOUT CARDIOVERSION N/A 04/08/2017   Procedure: TRANSESOPHAGEAL ECHOCARDIOGRAM (TEE);  Surgeon: [name], MD;  Location: [place];  Service: Open Heart Surgery;  Laterality: N/A;  . TRANSURETHRAL RESECTION OF BLADDER TUMOR  05/29/2011   Procedure: TRANSURETHRAL RESECTION OF BLADDER TUMOR (TURBT);  Surgeon: Fredricka Bonine, MD;  Location: [facility];  Service: Urology;  Laterality: N/A;  . VENTRICULAR ASSIST DEVICE INSERTION Right 03/31/2017   Procedure: VENTRICULAR ASSIST DEVICE INSERTION;  Surgeon: Leonie Man, MD;  Location: [place] [facility];  Service: Cardiovascular;  Laterality: Right;       Family History  Problem Relation Age of Onset  . Heart attack Sister   . Heart disease Sister     Social History   Tobacco Use  . Smoking status: Former Smoker    Packs/day: 1.00    Years: 40.00    Pack years: 40.00    Types: Cigarettes    Quit date: 03/31/2017    Years since quitting: 3.3  . Smokeless tobacco: Never Used  Vaping Use  . Vaping Use: Never used  Substance Use Topics  . Alcohol use: Yes    Comment: rarely  . Drug use: No    Home Medications Prior to Admission medications   Medication Sig Start Date End Date Taking? Authorizing Provider  apixaban (ELIQUIS) 5 MG TABS tablet Take 1 tablet (5 mg total) by mouth 2 (two) times daily. Needs appt 09/22/19  Yes Bensimhon, Shaune Pascal, MD  atorvastatin (LIPITOR) 80 MG tablet Take 1 tablet (80 mg total) by mouth daily. 11/06/19  Yes Bensimhon, Shaune Pascal, MD  carvedilol (COREG) 3.125 MG tablet Take 3.125 mg by mouth daily.   Yes [provider]  empagliflozin (JARDIANCE) 10 MG TABS tablet Take 10 mg by mouth daily. 08/21/19  Yes Bensimhon, Shaune Pascal, MD  ENTRESTO 97-103 MG TAKE 1  TABLET TWICE A DAY -DOSE CHANGE Patient taking differently: Take 1 tablet by mouth 2 (two) times daily. 10/27/19  Yes Bensimhon, Shaune Pascal, MD  esomeprazole (NEXIUM) 20 MG capsule Take 20 mg by mouth daily at 12 noon. OTC   Yes [provider]  metFORMIN (GLUCOPHAGE-XR) 500 MG 24 hr tablet Take 500 mg by mouth daily with breakfast. 11/05/18  Yes [provider]  oxymetazoline (AFRIN) 0.05 % nasal spray Place 1 spray into both nostrils at bedtime.   Yes [provider]  esomeprazole (NEXIUM) 40 MG capsule Take 40 mg by mouth daily. 08/17/20   [provider]    Allergies    Spironolactone and Niacin and related  Review of Systems   Review of Systems  Constitutional: Negative for chills and fever.  HENT: Negative for ear pain and sore throat.   Eyes: Negative for pain and visual disturbance.  Respiratory: Negative for cough and shortness of breath.  Cardiovascular: Negative for chest pain and palpitations.  Gastrointestinal: Positive for abdominal pain and nausea. Negative for vomiting.  Genitourinary: Negative for dysuria and hematuria.  Musculoskeletal: Negative for arthralgias and back pain.  Skin: Negative for color change and rash.  Neurological: Negative for seizures and syncope.  All other systems reviewed and are negative.   Physical Exam Updated Vital Signs  ED Triage Vitals [08/19/20 0923]  Enc Vitals Group     BP (!) 152/79     Pulse Rate 63     Resp 17     Temp (!) 97.4 F (36.3 C)     Temp Source Oral     SpO2 97 %     Weight 208 lb (94.3 kg)     Height 6\' 2"  (1.88 m)     Head Circumference      Peak Flow      Pain Score 1     Pain Loc      Pain Edu?      Excl. in [place]?     Physical Exam Vitals and nursing note reviewed.  Constitutional:      General: He is not in acute distress.    Appearance: He is well-developed. He is not ill-appearing.  HENT:     Head: Normocephalic and atraumatic.  Eyes:     Extraocular Movements:  Extraocular movements intact.     Conjunctiva/sclera: Conjunctivae normal.  Cardiovascular:     Rate and Rhythm: Normal rate and regular rhythm.     Heart sounds: Normal heart sounds. No murmur heard.   Pulmonary:     Effort: Pulmonary effort is normal. No respiratory distress.     Breath sounds: Normal breath sounds.  Abdominal:     General: Abdomen is flat. There is no distension.     Palpations: Abdomen is soft.     Tenderness: There is no abdominal tenderness. There is no guarding or rebound.  Musculoskeletal:     Cervical back: Neck supple.  Skin:    General: Skin is warm and dry.  Neurological:     General: No focal deficit present.     Mental Status: He is alert.  Psychiatric:        Mood and Affect: Mood normal.     ED Results / Procedures / Treatments   Labs (all labs ordered are listed, but only abnormal results are displayed) Labs Reviewed  LIPASE, BLOOD - Abnormal; Notable for the following components:      Result Value   Lipase 54 (*)    All other components within normal limits  COMPREHENSIVE METABOLIC PANEL - Abnormal; Notable for the following components:   Glucose, Bld 157 (*)    BUN 26 (*)    Creatinine, Ser 1.87 (*)    Albumin 3.4 (*)    AST 57 (*)    ALT 94 (*)    Alkaline Phosphatase 310 (*)    Total Bilirubin 1.4 (*)    GFR, Estimated 37 (*)    All other components within normal limits  CBC - Abnormal; Notable for the following components:   HCT 52.6 (*)    All other components within normal limits  URINALYSIS, ROUTINE W REFLEX MICROSCOPIC - Abnormal; Notable for the following components:   Glucose, UA >=500 (*)    Protein, ur 30 (*)    All other components within normal limits  RESP PANEL BY RT-PCR (FLU A&B, COVID) ARPGX2  PROTIME-INR    EKG EKG Interpretation  Date/Time:  Friday August 19 2020 09:55:11 EDT Ventricular Rate:  56 PR Interval:  193 QRS Duration: 111 QT Interval:  432 QTC Calculation: 417 R Axis:   95 Text  Interpretation: Sinus rhythm Biatrial enlargement Anteroseptal infarct, age indeterminate Confirmed by Lennice Sites [phone]) on 08/19/2020 10:40:30 AM   Radiology US Abdomen Limited RUQ (LIVER/GB)  Result Date: 08/19/2020 CLINICAL DATA:  Intermittent epigastric pain EXAM: ULTRASOUND ABDOMEN LIMITED RIGHT UPPER QUADRANT COMPARISON:  CT AP from 11/23/2014 FINDINGS: Gallbladder: Gallstones measuring up to 1.3 cm are noted. No gallbladder wall thickening, pericholecystic fluid or sonographic Murphy's sign. Common bile duct: Diameter: 10.3 mm. Liver: Increased echogenicity. Contour the liver is slightly irregular. Intrahepatic bile duct dilatation identified, mild. Portal vein is patent on color Doppler imaging with normal direction of blood flow towards the liver. Other: None. IMPRESSION: 1. Gallstones.  No secondary signs of acute cholecystitis. 2. Increase caliber of the common bile duct with mild intrahepatic biliary dilatation. Cannot exclude choledocholithiasis. Recommend further evaluation with MRCP/MRI with contrast material. 3. Increased echogenicity of the liver with slightly nodular contour. This could be better assessed with MRI of the abdomen. Electronically Signed   By: Kerby Moors M.D.   On: 08/19/2020 12:18    Procedures Procedures   Medications Ordered in ED Medications  0.9 %  sodium chloride infusion ( Intravenous Stopped 08/19/20 1153)    ED Course  I have reviewed the triage vital signs and the nursing notes.  Pertinent labs & imaging results that were available during my care of the patient were reviewed by me and considered in my medical decision making (see chart for details).    MDM Rules/Calculators/A&P                          [name] [name] is here after abnormal lab work at primary care doctor's office.  Had elevated bilirubin.  Was having right upper quadrant abdominal pain several days before that lab work.  Feels better now.  He had mild elevation in kidney  function and bilirubin upon chart review.  Overall his abdominal exam is unremarkable.  We will recheck lab work and get a right upper quadrant ultrasound.  Suspect may be gallstones, seems less likely to be pancreatitis as lipase is not very elevated.  Does not appear to have much abdominal pain or tenderness on exam.  No fever, no altered mental status and have lower concern for cholangitis.  Bilirubin 1.4.  Liver enzymes mildly elevated.  Creatinine also improved to 1.87.  Ultrasound shows gallstones but also possibly choledocholithiasis.  No fever.  No white count.  Overall he seems to be doing better.  Talked with Dr. Benson Norway who does recommend admission for ERCP.  Will admit to medicine.  Stable throughout my care.  This chart was dictated using voice recognition software.  Despite best efforts to proofread,  errors can occur which can change the documentation meaning.    Final Clinical Impression(s) / ED Diagnoses Final diagnoses:  Pain  Gallstones    Rx / DC Orders ED Discharge Orders    None       Lennice Sites, DO 08/19/20 1428

## 2020-08-19 NOTE — ED Notes (Signed)
Pt transported to US at this time. 

## 2020-08-19 NOTE — ED Notes (Signed)
Went in to check on pt and pt's wife admitted to hitting the pause alarms button on the monitor and she learned how to do it when pt was admitted on the heart floor. Educated pt on importance of not doing that so we can be alerted to any life-threatening alarms. Pt seemed resistant to education and had to re-emphasize that pt's have had poor outcomes when family/visitors have silenced and paused the alarms on the monitor. Stressed the importance of her not adjusting the monitor. Pt's wife verbalized understanding.

## 2020-08-19 NOTE — H&P (Addendum)
History and Physical    [name] [name] DOB: [birth year] DOA: 08/19/2020  PCP: Christain Sacramento, MD Consultants:  Klein/Bensimhon - cardiology; Pool - neurosurgery; Blackmon - orthopedics; Benson Norway - GI Patient coming from:  Home - lives with wife; NOK: Wife, [phone]  Chief Complaint: epigastric pain  HPI: [name] [name] is a 75 y.o. male with medical history significant of PVD; HTN; DM; CAD; bladder cancer; and chronic systolic CHF with AICD presenting with epigastric pain.  Sunday night, he developed midepigastric pain - this has happened before, like a strong burning sensation.  Lasted about 12 hours.  At 2 points in that time, he was shivering and felt febrile.  This was similar to prior episodes.  It resolved about 10AM the following day.  He has continued to have bloating and belching.  He saw his PCP on 4/20 and had blood work and they called today and told him to come to the ER.      ED Course:  Abdominal pain Sunday.  Saw PCP mid-week - LFTs up, creatinine up - labs back today and called to come in.  Has gallstones, likely choledocholithiasis.  For ERCP tomorrow with Dr. Benson Norway.  Improving labs today.   Review of Systems: As per HPI; otherwise review of systems reviewed and negative.   Ambulatory Status:  Ambulates without assistance  COVID Vaccine Status:  Complete plus booster  Past Medical History:  Diagnosis Date  . AICD (automatic cardioverter/defibrillator) present   . Arthritis   . Bladder cancer ([place])   . Borderline glaucoma   . Chronic systolic (congestive) heart failure ([place])   . Coronary artery disease   . Diabetes mellitus type 2, controlled ([place]) ORAL MED  . Essential hypertension   . Heart murmur   . Hepatitis   . Peripheral vascular disease ([place])   . Pneumonia     Past Surgical History:  Procedure Laterality Date  . CAROTID DUPLEX SCAN  07-26-2010   BILATERAL ICA  STENOSIS 1% - 39%  . CORONARY ARTERY BYPASS GRAFT N/A 04/08/2017    Procedure: CORONARY ARTERY BYPASS GRAFTING (CABG) TIMES TWO USING LEFT INTERNAL MAMMARY ARTERY AND LEFT SAPHENOUS LEG VEIN HARVESTED ENDOSCOPICALLY.  LEG VEIN ALSO HARVESTED FROM THE RIGHT LEG;  Surgeon: [name], MD;  Location: [place] [facility];  Service: Open Heart Surgery;  Laterality: N/A;  . CYSTOSCOPY WITH BIOPSY N/A 08/01/2012   Procedure: CYSTOSCOPY WITH BIOPSY BLADDER BIOPSY   ;  Surgeon: Fredricka Bonine, MD;  Location: [facility];  Service: Urology;  Laterality: N/A;  . FULGURATION OF BLADDER TUMOR N/A 08/01/2012   Procedure: FULGURATION OF BLADDER TUMOR;  Surgeon: Fredricka Bonine, MD;  Location[facility];  Service: Urology;  Laterality: N/A;  . ICD IMPLANT  11/20/2017  . ICD IMPLANT N/A 11/20/2017   Procedure: ICD IMPLANT;  Surgeon: Deboraha Sprang, MD;  Location: [place] [facility];  Service: Cardiovascular;  Laterality: N/A;  . LEFT HEART CATH AND CORONARY ANGIOGRAPHY N/A 03/31/2017   Procedure: LEFT HEART CATH AND CORONARY ANGIOGRAPHY;  Surgeon: Leonie Man, MD;  Location: [place] [facility];  Service: Cardiovascular;  Laterality: N/A;  . LUMBAR LAMINECTOMY/DECOMPRESSION MICRODISCECTOMY Bilateral 06/16/2019   Procedure: Bilateral Lumbar Four-Five Laminectomy and Foraminotomy;  Surgeon: Earnie Larsson, MD;  Location: [place];  Service: Neurosurgery;  Laterality: Bilateral;  posterior  . RIGHT HEART CATH Right 03/31/2017   Procedure: RIGHT HEART CATH;  Surgeon: Leonie Man, MD;  Location: [place]  [facility];  Service: Cardiovascular;  Laterality: Right;  . TEE WITHOUT CARDIOVERSION N/A 04/08/2017   Procedure: TRANSESOPHAGEAL ECHOCARDIOGRAM (TEE);  Surgeon: [name], MD;  Location: [place];  Service: Open Heart Surgery;  Laterality: N/A;  . TRANSURETHRAL RESECTION OF BLADDER TUMOR  05/29/2011   Procedure: TRANSURETHRAL RESECTION OF BLADDER TUMOR (TURBT);  Surgeon: Fredricka Bonine, MD;  Location[facility];  Service:  Urology;  Laterality: N/A;  . VENTRICULAR ASSIST DEVICE INSERTION Right 03/31/2017   Procedure: VENTRICULAR ASSIST DEVICE INSERTION;  Surgeon: Leonie Man, MD;  Location: [place] [facility];  Service: Cardiovascular;  Laterality: Right;    Social History   Socioeconomic History  . Marital status: Married    Spouse name: Not on file  . Number of children: Not on file  . Years of education: Not on file  . Highest education level: Not on file  Occupational History  . Occupation: retired  Tobacco Use  . Smoking status: Former Smoker    Packs/day: 1.00    Years: 40.00    Pack years: 40.00    Types: Cigarettes    Quit date: 03/31/2017    Years since quitting: 3.3  . Smokeless tobacco: Never Used  Vaping Use  . Vaping Use: Never used  Substance and Sexual Activity  . Alcohol use: Not Currently    Comment: rarely  . Drug use: No  . Sexual activity: Not on file  Other Topics Concern  . Not on file  Social History Narrative  . Not on file   Social Determinants of Health   Financial Resource Strain: Not on file  Food Insecurity: Not on file  Transportation Needs: Not on file  Physical Activity: Not on file  Stress: Not on file  Social Connections: Not on file  Intimate Partner Violence: Not on file    Allergies  Allergen Reactions  . Spironolactone Other (See Comments)    Painful gynecomastia  . Niacin And Related     Family History  Problem Relation Age of Onset  . Heart attack Sister   . Heart disease Sister     Prior to Admission medications   Medication Sig Start Date End Date Taking? Authorizing Provider  apixaban (ELIQUIS) 5 MG TABS tablet Take 1 tablet (5 mg total) by mouth 2 (two) times daily. Needs appt 09/22/19  Yes Bensimhon, Shaune Pascal, MD  atorvastatin (LIPITOR) 80 MG tablet Take 1 tablet (80 mg total) by mouth daily. 11/06/19  Yes Bensimhon, Shaune Pascal, MD  carvedilol (COREG) 3.125 MG tablet Take 3.125 mg by mouth daily.   Yes [provider]   empagliflozin (JARDIANCE) 10 MG TABS tablet Take 10 mg by mouth daily. 08/21/19  Yes Bensimhon, Shaune Pascal, MD  ENTRESTO 97-103 MG TAKE 1 TABLET TWICE A DAY -DOSE CHANGE Patient taking differently: Take 1 tablet by mouth 2 (two) times daily. 10/27/19  Yes Bensimhon, Shaune Pascal, MD  esomeprazole (NEXIUM) 20 MG capsule Take 20 mg by mouth daily at 12 noon. OTC   Yes [provider]  metFORMIN (GLUCOPHAGE-XR) 500 MG 24 hr tablet Take 500 mg by mouth daily with breakfast. 11/05/18  Yes [provider]  oxymetazoline (AFRIN) 0.05 % nasal spray Place 1 spray into both nostrils at bedtime.   Yes [provider]  esomeprazole (NEXIUM) 40 MG capsule Take 40 mg by mouth daily. 08/17/20   [provider]    Physical Exam: Vitals:   08/19/20 1430 08/19/20 1500 08/19/20 1617 08/19/20 1625  BP: 131/84 (!) 148/87 (!) 187/86 (!) 145/88  Pulse: 65 63 83 75  Resp: 13 17 17 17   Temp:   97.9 F (36.6 C) 97.6 F (36.4 C)  TempSrc:   Oral Oral  SpO2: 97% 97% 97% 99%  Weight:      Height:         . General:  Appears calm and comfortable and is in NAD . Eyes:  PERRL, EOMI, normal lids, iris . ENT:  grossly normal hearing, lips & tongue, mmm; appropriate dentition . Neck:  no LAD, masses or thyromegaly . Cardiovascular:  RRR, no m/r/g. No LE edema.  [name] Respiratory:   CTA bilaterally with no wheezes/rales/rhonchi.  Normal respiratory effort. . Abdomen:  soft, NT, ND, NABS . Skin:  no rash or induration seen on limited exam . Musculoskeletal:  grossly normal tone BUE/BLE, good ROM, no bony abnormality . Psychiatric:  grossly normal mood and affect, speech fluent and appropriate, AOx3 . Neurologic:  CN 2-12 grossly intact, moves all extremities in coordinated fashion    Radiological Exams on Admission: Independently reviewed - see discussion in A/P where applicable  US Abdomen Limited RUQ (LIVER/GB)  Result Date: 08/19/2020 CLINICAL DATA:  Intermittent epigastric pain  EXAM: ULTRASOUND ABDOMEN LIMITED RIGHT UPPER QUADRANT COMPARISON:  CT AP from 11/23/2014 FINDINGS: Gallbladder: Gallstones measuring up to 1.3 cm are noted. No gallbladder wall thickening, pericholecystic fluid or sonographic Murphy's sign. Common bile duct: Diameter: 10.3 mm. Liver: Increased echogenicity. Contour the liver is slightly irregular. Intrahepatic bile duct dilatation identified, mild. Portal vein is patent on color Doppler imaging with normal direction of blood flow towards the liver. Other: None. IMPRESSION: 1. Gallstones.  No secondary signs of acute cholecystitis. 2. Increase caliber of the common bile duct with mild intrahepatic biliary dilatation. Cannot exclude choledocholithiasis. Recommend further evaluation with MRCP/MRI with contrast material. 3. Increased echogenicity of the liver with slightly nodular contour. This could be better assessed with MRI of the abdomen. Electronically Signed   By: Kerby Moors M.D.   On: 08/19/2020 12:18    EKG: Independently reviewed.  NSR with rate 56; nonspecific ST changes with no evidence of acute ischemia   Labs on Admission: I have personally reviewed the available labs and imaging studies at the time of the admission.  Pertinent labs:   Glucose 157 BUN 26/Creatinine 1.87/GFR 37; 36/2.23 on 4/20; baseline 1.45/47 AP 310 AST 57/ALT 94/Bili 1.4; 56/123/3.4 on 4/20 WBC 7.9 INR 1.0 COVID/flu negative UA: >500 glucose, 30 protein A1c 7.6 on 4/20   Assessment/Plan Principal Problem:   Cholelithiasis with choledocholithiasis Active Problems:   Essential hypertension   Diabetes mellitus type 2, controlled (HCC)   CAD (coronary artery disease)   Chronic combined systolic and diastolic CHF (congestive heart failure) (HCC)   CKD (chronic kidney disease), stage III ([place])   Choledocholithiasis with recent biliary obstruction -Patient with acute onset of epigastric pain on Sunday; completely resolved at this time -He was seen by his  PCP on 4/20 and blood work was clearly concerning for biliary obstruction; results returned today and patient was sent to ED -Labs shows improvement but have not yet normalized -Despite being pain-free, he had recent bile duct obstruction and has persistent ductal dilatation with gallstones -He is at risk for recurrence and would benefit from ERCP/cholecystectomy while hospitalized -Given that he is now asymptomatic, he would like for this to be performed in a timely manner or he would like to sign out AMA -After discussion with GI, myself,  and surgery, the patient is willing to stay but does request that the evaluation and treatment be initiated rather than him having to wait all weekend -Initial plan was for ERCP tomorrow but Dr. Silverio Decamp prefers MRCP prior to ERCP -MRCP is problematic because of his device and I have spoken at length with the [facility] as well as with Dr. Sung Amabile and the [employer] tech; I am still optimistic that this can be performed prior to Monday so as to not cause an unnecessarily prolonged LOS.  It is currently scheduled for 2130. -Clear liquids, NPO after midnight - once MRCP is performed -Morphine for pain, Zofran for nausea -GI consult is pending  AKI with stage 3a CKD -Baseline creatinine 1.4 -Creatinine up to 2.23 on 4/20  -Current back to 1.87 -Will continue to hydrate and follow  Chronic systolic CHF with AICD -Does not have pacer; AICD has never fired -Can safely turned off device for MRI per Dr. Haroldine Laws -Heparin in lieu of Eliquis -Continue Entresto, Coreg -Cards consult for clearance  HTN -Continue Entresto, Coreg  DM -A1c is 8, indicating poor control -hold Glucophage, Jardiance -Cover with moderate-scale SSI     Note: This patient has been tested and is negative for the novel coronavirus COVID-19. The patient has been fully vaccinated against COVID-19.   Level of care: Med-SurgMed Surg DVT prophylaxis:  Heparin drip Code Status:  Full  - confirmed with patient/family Family Communication: Wife present throughout evaluation. Disposition Plan:  The patient is from: home  Anticipated d/c is to: home without [facility] services  Anticipated d/c date will depend on clinical response to treatment, likely Monday 4/25  Patient is currently: acutely ill Consults called: Surgery; Cardiology; GI Admission status:  Admit - It is my clinical opinion that admission to INPATIENT is reasonable and necessary because of the expectation that this patient will require hospital care that crosses at least 2 midnights to treat this condition based on the medical complexity of the problems presented.  Given the aforementioned information, the predictability of an adverse outcome is felt to be significant.    Karmen Bongo MD [facility]   How to contact the [facility] Attending or Consulting provider [place] or covering provider during after hours [place], for this patient?  1. Check the care team in [facility] and look for a) attending/consulting TRH provider listed and b) the [facility] team listed 2. Log into [email or web address] and use [place]'s universal password to access. If you do not have the password, please contact the hospital operator. 3. Locate the [facility] provider you are looking for under [facility] and page to a number that you can be directly reached. 4. If you still have difficulty reaching the provider, please page the [facility] (Director on Call) for the Hospitalists listed on amion for assistance.   08/19/2020, 6:47 PM

## 2020-08-19 NOTE — Consult Note (Signed)
Reason for Consult: ? Choledocholithiasis Referring Physician: [facility]  [name] HPI: This is a 75 year old male with a PMH of ischemic cardiomyotpahy, CHF, STEMI with cardiogenic shock treated with Impella, subsequent sepsis, s/p CABG 03/2017, s/p ICD 2019, afib with RVR, DM, HTN, and bladder cancer admitted with symptomatic cholelithiasis.  Earlier this week he reported having persistent epigastric pain with fevers and chills.  The symptoms were self-medicated with TUMS, but this was not effective.  However, his symptoms resolved after 12 hours. He subsequent followed up with his PCP.  Blood work on 08/17/2020 showed an AP at 304, AST, 56, ALT 123, and TB of 3.4.  As a result of his blood work he was instructed to present to the ER.  Work up showed an improvement with his liver enzymes and TB (ALT 94 and TB 1.4).  The RUQ U/S was positive for a dilated CBD and a mild intrahepatic biliary ductal dilation.  The patient denies any further pain since the resolution of his pain earlier this week.  Past Medical History:  Diagnosis Date  . AICD (automatic cardioverter/defibrillator) present   . Arthritis   . Bladder cancer ([place])   . Borderline glaucoma   . Chronic systolic (congestive) heart failure ([place])   . Coronary artery disease   . Diabetes mellitus type 2, controlled ([place]) ORAL MED  . Essential hypertension   . Heart murmur   . Hepatitis   . Peripheral vascular disease ([place])   . Pneumonia     Past Surgical History:  Procedure Laterality Date  . CAROTID DUPLEX SCAN  07-26-2010   BILATERAL ICA  STENOSIS 1% - 39%  . CORONARY ARTERY BYPASS GRAFT N/A 04/08/2017   Procedure: CORONARY ARTERY BYPASS GRAFTING (CABG) TIMES TWO USING LEFT INTERNAL MAMMARY ARTERY AND LEFT SAPHENOUS LEG VEIN HARVESTED ENDOSCOPICALLY.  LEG VEIN ALSO HARVESTED FROM THE RIGHT LEG;  Surgeon: [name], MD;  Location: [place] [facility];  Service: Open Heart Surgery;  Laterality: N/A;  . CYSTOSCOPY WITH BIOPSY  N/A 08/01/2012   Procedure: CYSTOSCOPY WITH BIOPSY BLADDER BIOPSY   ;  Surgeon: Fredricka Bonine, MD;  Location: [facility];  Service: Urology;  Laterality: N/A;  . FULGURATION OF BLADDER TUMOR N/A 08/01/2012   Procedure: FULGURATION OF BLADDER TUMOR;  Surgeon: Fredricka Bonine, MD;  Location: [facility];  Service: Urology;  Laterality: N/A;  . ICD IMPLANT  11/20/2017  . ICD IMPLANT N/A 11/20/2017   Procedure: ICD IMPLANT;  Surgeon: Deboraha Sprang, MD;  Location: [place] [facility];  Service: Cardiovascular;  Laterality: N/A;  . LEFT HEART CATH AND CORONARY ANGIOGRAPHY N/A 03/31/2017   Procedure: LEFT HEART CATH AND CORONARY ANGIOGRAPHY;  Surgeon: Leonie Man, MD;  Location: [place] [facility];  Service: Cardiovascular;  Laterality: N/A;  . LUMBAR LAMINECTOMY/DECOMPRESSION MICRODISCECTOMY Bilateral 06/16/2019   Procedure: Bilateral Lumbar Four-Five Laminectomy and Foraminotomy;  Surgeon: Earnie Larsson, MD;  Location: [place];  Service: Neurosurgery;  Laterality: Bilateral;  posterior  . RIGHT HEART CATH Right 03/31/2017   Procedure: RIGHT HEART CATH;  Surgeon: Leonie Man, MD;  Location: [place] [facility];  Service: Cardiovascular;  Laterality: Right;  . TEE WITHOUT CARDIOVERSION N/A 04/08/2017   Procedure: TRANSESOPHAGEAL ECHOCARDIOGRAM (TEE);  Surgeon: [name], MD;  Location: [place];  Service: Open Heart Surgery;  Laterality: N/A;  . TRANSURETHRAL RESECTION OF BLADDER TUMOR  05/29/2011   Procedure: TRANSURETHRAL RESECTION OF BLADDER TUMOR (TURBT);  Surgeon: Rodman Key  Marella Bile, MD;  Location: [facility];  Service: Urology;  Laterality: N/A;  . VENTRICULAR ASSIST DEVICE INSERTION Right 03/31/2017   Procedure: VENTRICULAR ASSIST DEVICE INSERTION;  Surgeon: Leonie Man, MD;  Location: [place] [facility];  Service: Cardiovascular;  Laterality: Right;    Family History  Problem Relation Age of Onset  . Heart attack Sister    . Heart disease Sister     Social History:  reports that he quit smoking about 3 years ago. His smoking use included cigarettes. He has a 40.00 pack-year smoking history. He has never used smokeless tobacco. He reports previous alcohol use. He reports that he does not use drugs.  Allergies:  Allergies  Allergen Reactions  . Spironolactone Other (See Comments)    Painful gynecomastia  . Niacin And Related     Medications:  Scheduled: . [START ON 08/20/2020] atorvastatin  80 mg Oral Daily  . [START ON 08/20/2020] carvedilol  3.125 mg Oral BID WC  . docusate sodium  100 mg Oral BID  . insulin aspart  0-15 Units Subcutaneous TID WC  . [START ON 08/20/2020] pantoprazole  40 mg Oral Daily  . sacubitril-valsartan  1 tablet Oral BID   Continuous: . heparin 1,350 Units/hr (08/19/20 1848)  . lactated ringers 25 mL/hr at 08/19/20 1916    Results for orders placed or performed during the hospital encounter of 08/19/20 (from the past 24 hour(s))  Urinalysis, Routine w reflex microscopic Urine, Clean Catch     Status: Abnormal   Collection Time: 08/19/20  9:25 AM  Result Value Ref Range   Color, Urine YELLOW YELLOW   APPearance CLEAR CLEAR   Specific Gravity, Urine 1.015 1.005 - 1.030   pH 5.0 5.0 - 8.0   Glucose, UA >=500 (A) NEGATIVE mg/dL   Hgb urine dipstick NEGATIVE NEGATIVE   Bilirubin Urine NEGATIVE NEGATIVE   Ketones, ur NEGATIVE NEGATIVE mg/dL   Protein, ur 30 (A) NEGATIVE mg/dL   Nitrite NEGATIVE NEGATIVE   Leukocytes,Ua NEGATIVE NEGATIVE   RBC / HPF 0-5 0 - 5 RBC/hpf   WBC, UA 0-5 0 - 5 WBC/hpf   Bacteria, UA NONE SEEN NONE SEEN   Squamous Epithelial / LPF 0-5 0 - 5   Mucus PRESENT   Lipase, blood     Status: Abnormal   Collection Time: 08/19/20  9:30 AM  Result Value Ref Range   Lipase 54 (H) 11 - 51 U/L  Comprehensive metabolic panel     Status: Abnormal   Collection Time: 08/19/20  9:30 AM  Result Value Ref Range   Sodium 138 135 - 145 mmol/L   Potassium 4.8 3.5  - 5.1 mmol/L   Chloride 107 98 - 111 mmol/L   CO2 23 22 - 32 mmol/L   Glucose, Bld 157 (H) 70 - 99 mg/dL   BUN 26 (H) 8 - 23 mg/dL   Creatinine, Ser 1.87 (H) 0.61 - 1.24 mg/dL   Calcium 9.4 8.9 - 10.3 mg/dL   Total Protein 6.8 6.5 - 8.1 g/dL   Albumin 3.4 (L) 3.5 - 5.0 g/dL   AST 57 (H) 15 - 41 U/L   ALT 94 (H) 0 - 44 U/L   Alkaline Phosphatase 310 (H) 38 - 126 U/L   Total Bilirubin 1.4 (H) 0.3 - 1.2 mg/dL   GFR, Estimated 37 (L) >60 mL/min   Anion gap 8 5 - 15  CBC     Status: Abnormal   Collection Time: 08/19/20  9:30  AM  Result Value Ref Range   WBC 7.9 4.0 - 10.5 K/uL   RBC 5.76 4.22 - 5.81 MIL/uL   Hemoglobin 16.8 13.0 - 17.0 g/dL   HCT 52.6 (H) 39.0 - 52.0 %   MCV 91.3 80.0 - 100.0 fL   MCH 29.2 26.0 - 34.0 pg   MCHC 31.9 30.0 - 36.0 g/dL   RDW 15.5 11.5 - 15.5 %   Platelets 187 150 - 400 K/uL   nRBC 0.0 0.0 - 0.2 %  Protime-INR     Status: None   Collection Time: 08/19/20  9:35 AM  Result Value Ref Range   Prothrombin Time 13.4 11.4 - 15.2 seconds   INR 1.0 0.8 - 1.2  Resp Panel by RT-PCR (Flu A&B, Covid) Nasopharyngeal Swab     Status: None   Collection Time: 08/19/20  9:37 AM   Specimen: Nasopharyngeal Swab; Nasopharyngeal(NP) swabs in vial transport medium  Result Value Ref Range   SARS Coronavirus 2 by RT PCR NEGATIVE NEGATIVE   Influenza A by PCR NEGATIVE NEGATIVE   Influenza B by PCR NEGATIVE NEGATIVE     US Abdomen Limited RUQ (LIVER/GB)  Result Date: 08/19/2020 CLINICAL DATA:  Intermittent epigastric pain EXAM: ULTRASOUND ABDOMEN LIMITED RIGHT UPPER QUADRANT COMPARISON:  CT AP from 11/23/2014 FINDINGS: Gallbladder: Gallstones measuring up to 1.3 cm are noted. No gallbladder wall thickening, pericholecystic fluid or sonographic Murphy's sign. Common bile duct: Diameter: 10.3 mm. Liver: Increased echogenicity. Contour the liver is slightly irregular. Intrahepatic bile duct dilatation identified, mild. Portal vein is patent on color Doppler imaging with normal  direction of blood flow towards the liver. Other: None. IMPRESSION: 1. Gallstones.  No secondary signs of acute cholecystitis. 2. Increase caliber of the common bile duct with mild intrahepatic biliary dilatation. Cannot exclude choledocholithiasis. Recommend further evaluation with MRCP/MRI with contrast material. 3. Increased echogenicity of the liver with slightly nodular contour. This could be better assessed with MRI of the abdomen. Electronically Signed   By: Kerby Moors M.D.   On: 08/19/2020 12:18    ROS:  As stated above in the HPI otherwise negative.  Blood pressure (!) 187/86, pulse 83, temperature 97.9 F (36.6 C), temperature source Oral, resp. rate 17, height 6\' 2"  (1.88 m), weight 94.3 kg, SpO2 97 %.    PE: Gen: NAD, Alert and Oriented, mild jaundice HEENT:  [place]/AT, EOMI Neck: Supple, no LAD Lungs: CTA Bilaterally CV: RRR without M/G/R ABD: Soft, NTND, +BS Ext: No C/C/E  Assessment/Plan: 1) ? Choledocholithiasis. 2) Cholelithiasis. 3) CHF - stable. 4) Abnormal liver enzymes.    Clinically the patient is well.  He denies any issues with abdominal pain since earlier this week.  It is possible that he passed the gallstone as his liver enzymes are improving.  The CBD was measured to be 10.3 mm with the ultrasound.    Plan: 1) MRCP.  Pending the results an ERCP will be performed. 2) Follow liver enzymes. 3) Dr. Haroldine Laws is having the [employer] rep come by today to manage the AICD for the MRCP. Polly Barner D 08/19/2020, 4:55 PM

## 2020-08-19 NOTE — ED Notes (Signed)
Pt requesting to talk to Dr. Lorin Mercy. Pt doesn't want to wait in the hospital until Monday for his surgery. Explained to pt that if he leaves it would be against medical advice and that he would have to start the process all over. Notified Yates MD. Lorin Mercy MD to come talk to pt when available. 2 providers at bedside now.

## 2020-08-19 NOTE — ED Triage Notes (Signed)
Pt reports epigastric pain that started on Sunday, denies n/v, went to his family medicine doctor 2 days ago and was sent here due to possible billiary obstruction and AKI. Pt reports bright orange urine, denies pain or burning.

## 2020-08-19 NOTE — ED Notes (Signed)
Pt agreeable to stay at this time. Pt to be transported upstairs to inpatient bed.

## 2020-08-19 NOTE — Progress Notes (Signed)
ANTICOAGULATION CONSULT NOTE - Initial Consult  Pharmacy Consult for heparin Indication: atrial fibrillation  Allergies  Allergen Reactions  . Spironolactone Other (See Comments)    Painful gynecomastia  . Niacin And Related     Patient Measurements: Height: 6\' 2"  (188 cm) Weight: 94.3 kg (208 lb) IBW/kg (Calculated) : 82.2 Heparin Dosing Weight: TBW  Vital Signs: Temp: 97.9 F (36.6 C) (04/22 1617) Temp Source: Oral (04/22 1617) BP: 187/86 (04/22 1617) Pulse Rate: 83 (04/22 1617)  Labs: Recent Labs    08/19/20 0930 08/19/20 0935  HGB 16.8  --   HCT 52.6*  --   PLT 187  --   LABPROT  --  13.4  INR  --  1.0  CREATININE 1.87*  --     Estimated Creatinine Clearance: 39.7 mL/min (A) (by C-G formula based on SCr of 1.87 mg/dL (H)).   Medical History: Past Medical History:  Diagnosis Date  . AICD (automatic cardioverter/defibrillator) present   . Arthritis   . Bladder cancer ([place])   . Borderline glaucoma   . Chronic systolic (congestive) heart failure ([place])   . Coronary artery disease   . Diabetes mellitus type 2, controlled ([place]) ORAL MED  . Essential hypertension   . Heart murmur   . Hepatitis   . Peripheral vascular disease ([place])   . Pneumonia    Assessment: 57 YOM presenting with abd pain, possible choledocholithiasis, hx of afib on Eliquis, holding for possible GI procedure.  Last dose of Eliquis 4/21 @1800   Goal of Therapy:  Heparin level 0.3-0.7 units/ml aPTT 66-102 seconds Monitor platelets by anticoagulation protocol: Yes   Plan:  Heparin gtt at 1350 units/hr, no bolus F/u 8 hour aPTT/HL F/u intervention plans and ability to transition back to [place], PharmD Clinical Pharmacist ED Pharmacist Phone # [phone] 08/19/2020 4:25 PM

## 2020-08-19 NOTE — Consult Note (Signed)
[name] [name] [birth year]  [id number].    Requesting MD: Dr. Lorin Mercy Chief Complaint/Reason for Consult: Cholelithiasis, possible choledocholithiasis  HPI: [name] [name] is a 75 y.o. male with a hx of CHF (EF 25-30% on Echo 12/10/18) w/ AICF in place, CAD s/p CABG on Eliquis (last dose 4/21 am), HTN, PVD and DM2 who presented to [facility] ED with abnormal labs and prior epigastric abdominal pain.  Patient reports on Sunday night, after eating Easter dinner consisting of ham, beans, pasta etc. he develops acute onset of epigastric abdominal pain without radiation that was constant, severe and associated with chills and subjective fevers.  He reports he tried Tums for this without any relief.  His symptoms resolved after 12 hours.  He is not had any abdominal pain since that time.  He began eating a bland diet to prevent recurrence of his symptoms.  He went to his PCP on 4/20 where lab work was done and showed elevated LFTs (alk phos 304, AST 56, ALT 123, T bili of 3.4), and lipase (107).  He was directed to the emergency department for evaluation.  Today LFTs are down (alk phos 310, AST 57, ALT 94, T bili 1.4). RUQ showed gallstones without signs of acute cholecystitis.  There is increased caliber of the CBD with mild intrahepatic biliary dilation. GI originally planning ERCP but now recommending MRCP given downtrending labs. Patient reports hx of prior attacks x 3 in the last year. He is currently without abdominal pain, n/v.   ROS: Review of Systems  Constitutional: Positive for chills and fever.  Respiratory: Negative for cough and shortness of breath.   Cardiovascular: Negative for chest pain.  Gastrointestinal: Positive for abdominal pain. Negative for diarrhea, nausea and vomiting.  Genitourinary: Negative for dysuria.  Musculoskeletal: Negative for back pain.  Psychiatric/Behavioral: Negative for substance abuse.  All other systems reviewed and are negative.   Family History  Problem  Relation Age of Onset  . Heart attack Sister   . Heart disease Sister     Past Medical History:  Diagnosis Date  . AICD (automatic cardioverter/defibrillator) present   . Arthritis   . Bladder cancer ([place])   . Borderline glaucoma   . Chronic systolic (congestive) heart failure ([place])   . Coronary artery disease   . Diabetes mellitus type 2, controlled ([place]) ORAL MED  . Essential hypertension   . Heart murmur   . Hepatitis   . Peripheral vascular disease ([place])   . Pneumonia     Past Surgical History:  Procedure Laterality Date  . CAROTID DUPLEX SCAN  07-26-2010   BILATERAL ICA  STENOSIS 1% - 39%  . CORONARY ARTERY BYPASS GRAFT N/A 04/08/2017   Procedure: CORONARY ARTERY BYPASS GRAFTING (CABG) TIMES TWO USING LEFT INTERNAL MAMMARY ARTERY AND LEFT SAPHENOUS LEG VEIN HARVESTED ENDOSCOPICALLY.  LEG VEIN ALSO HARVESTED FROM THE RIGHT LEG;  Surgeon: [name], MD;  Location: [place] [facility];  Service: Open Heart Surgery;  Laterality: N/A;  . CYSTOSCOPY WITH BIOPSY N/A 08/01/2012   Procedure: CYSTOSCOPY WITH BIOPSY BLADDER BIOPSY   ;  Surgeon: Fredricka Bonine, MD;  [facility];  Service: Urology;  Laterality: N/A;  . FULGURATION OF BLADDER TUMOR N/A 08/01/2012   Procedure: FULGURATION OF BLADDER TUMOR;  Surgeon: Fredricka Bonine, MD;  Location: [facility];  Service: Urology;  Laterality: N/A;  . ICD IMPLANT  11/20/2017  . ICD IMPLANT N/A 11/20/2017   Procedure: ICD IMPLANT;  Surgeon:  Deboraha Sprang, MD;  Location: [place] [facility];  Service: Cardiovascular;  Laterality: N/A;  . LEFT HEART CATH AND CORONARY ANGIOGRAPHY N/A 03/31/2017   Procedure: LEFT HEART CATH AND CORONARY ANGIOGRAPHY;  Surgeon: Leonie Man, MD;  Location: [place] [facility];  Service: Cardiovascular;  Laterality: N/A;  . LUMBAR LAMINECTOMY/DECOMPRESSION MICRODISCECTOMY Bilateral 06/16/2019   Procedure: Bilateral Lumbar Four-Five Laminectomy and Foraminotomy;  Surgeon:  Earnie Larsson, MD;  Location: [place];  Service: Neurosurgery;  Laterality: Bilateral;  posterior  . RIGHT HEART CATH Right 03/31/2017   Procedure: RIGHT HEART CATH;  Surgeon: Leonie Man, MD;  Location: [place] [facility];  Service: Cardiovascular;  Laterality: Right;  . TEE WITHOUT CARDIOVERSION N/A 04/08/2017   Procedure: TRANSESOPHAGEAL ECHOCARDIOGRAM (TEE);  Surgeon: [name], MD;  Location: [place];  Service: Open Heart Surgery;  Laterality: N/A;  . TRANSURETHRAL RESECTION OF BLADDER TUMOR  05/29/2011   Procedure: TRANSURETHRAL RESECTION OF BLADDER TUMOR (TURBT);  Surgeon: Fredricka Bonine, MD;  Location: [facility];  Service: Urology;  Laterality: N/A;  . VENTRICULAR ASSIST DEVICE INSERTION Right 03/31/2017   Procedure: VENTRICULAR ASSIST DEVICE INSERTION;  Surgeon: Leonie Man, MD;  Location: [place] [facility];  Service: Cardiovascular;  Laterality: Right;    Social History:  reports that he quit smoking about 3 years ago. His smoking use included cigarettes. He has a 40.00 pack-year smoking history. He has never used smokeless tobacco. He reports previous alcohol use. He reports that he does not use drugs. 40 pack year hx. Quit 3 years ago.  No alcohol use No illicit drug use Ret[employer]  Allergies:  Allergies  Allergen Reactions  . Spironolactone Other (See Comments)    Painful gynecomastia  . Niacin And Related     (Not in a hospital admission)    Physical Exam: Blood pressure (!) 148/87, pulse 63, temperature (!) 97.4 F (36.3 C), temperature source Oral, resp. rate 17, height _0  (1.88 m), weight 94.3 kg, SpO2 97 %. General: pleasant, WD/WN white male who is laying in bed in NAD HEENT: head is normocephalic, atraumatic.  Sclera are noninjected.  PERRL.  Ears and nose without any masses or lesions.  Mouth is pink and moist. Dentition fair Heart: regular, rate, and rhythm.  Normal s1,s2. No obvious murmurs, gallops, or rubs noted.  Prior sternotomy scar is well healed. ICD left chest wall. Palpable radial and pedal pulses bilaterally  Lungs: CTAB, no wheezes, rhonchi, or rales noted.  Respiratory effort nonlabored Abd: Protuberant, soft, NT/ND, +BS, no masses, hernias, or organomegaly MS: no BUE/BLE edema, calves soft and nontender Skin: warm and dry with no masses, lesions, or rashes Psych: A&Ox4 with an appropriate affect Neuro: cranial nerves grossly intact, equal strength in BUE/BLE bilaterally, normal speech, thought process intact, gait not assessed, moves all extremities.  Results for orders placed or performed during the hospital encounter of 08/19/20 (from the past 48 hour(s))  Urinalysis, Routine w reflex microscopic Urine, Clean Catch     Status: Abnormal   Collection Time: 08/19/20  9:25 AM  Result Value Ref Range   Color, Urine YELLOW YELLOW   APPearance CLEAR CLEAR   Specific Gravity, Urine 1.015 1.005 - 1.030   pH 5.0 5.0 - 8.0   Glucose, UA >=500 (A) NEGATIVE mg/dL   Hgb urine dipstick NEGATIVE NEGATIVE   Bilirubin Urine NEGATIVE NEGATIVE   Ketones, ur NEGATIVE NEGATIVE mg/dL   Protein, ur 30 (A) NEGATIVE mg/dL  Nitrite NEGATIVE NEGATIVE   Leukocytes,Ua NEGATIVE NEGATIVE   RBC / HPF 0-5 0 - 5 RBC/hpf   WBC, UA 0-5 0 - 5 WBC/hpf   Bacteria, UA NONE SEEN NONE SEEN   Squamous Epithelial / LPF 0-5 0 - 5   Mucus PRESENT     Comment: Performed at [facility], [place] [address], [place], [place] [address]  Lipase, blood     Status: Abnormal   Collection Time: 08/19/20  9:30 AM  Result Value Ref Range   Lipase 54 (H) 11 - 51 U/L    Comment: Performed at [facility], [place] [address], [place], [place] [address]  Comprehensive metabolic panel     Status: Abnormal   Collection Time: 08/19/20  9:30 AM  Result Value Ref Range   Sodium 138 135 - 145 mmol/L   Potassium 4.8 3.5 - 5.1 mmol/L   Chloride 107 98 - 111 mmol/L   CO2 23 22 - 32 mmol/L   Glucose, Bld 157 (H) 70 - 99 mg/dL    Comment:  Glucose reference range applies only to samples taken after fasting for at least 8 hours.   BUN 26 (H) 8 - 23 mg/dL   Creatinine, Ser 1.87 (H) 0.61 - 1.24 mg/dL   Calcium 9.4 8.9 - 10.3 mg/dL   Total Protein 6.8 6.5 - 8.1 g/dL   Albumin 3.4 (L) 3.5 - 5.0 g/dL   AST 57 (H) 15 - 41 U/L   ALT 94 (H) 0 - 44 U/L   Alkaline Phosphatase 310 (H) 38 - 126 U/L   Total Bilirubin 1.4 (H) 0.3 - 1.2 mg/dL   GFR, Estimated 37 (L) >60 mL/min    Comment: (NOTE) Calculated using the CKD-EPI Creatinine Equation (2021)    Anion gap 8 5 - 15    Comment: Performed at [facility], [place] [address], [place], [place] [address]  CBC     Status: Abnormal   Collection Time: 08/19/20  9:30 AM  Result Value Ref Range   WBC 7.9 4.0 - 10.5 K/uL   RBC 5.76 4.22 - 5.81 MIL/uL   Hemoglobin 16.8 13.0 - 17.0 g/dL   HCT 52.6 (H) 39.0 - 52.0 %   MCV 91.3 80.0 - 100.0 fL   MCH 29.2 26.0 - 34.0 pg   MCHC 31.9 30.0 - 36.0 g/dL   RDW 15.5 11.5 - 15.5 %   Platelets 187 150 - 400 K/uL   nRBC 0.0 0.0 - 0.2 %    Comment: Performed at [facility], [place] [address] [place], [place] [address]  Protime-INR     Status: None   Collection Time: 08/19/20  9:35 AM  Result Value Ref Range   Prothrombin Time 13.4 11.4 - 15.2 seconds   INR 1.0 0.8 - 1.2    Comment: (NOTE) INR goal varies based on device and disease states. Performed at [facility], [place] [address] [place], [place] [address]   Resp Panel by RT-PCR (Flu A&B, Covid) Nasopharyngeal Swab     Status: None   Collection Time: 08/19/20  9:37 AM   Specimen: Nasopharyngeal Swab; Nasopharyngeal(NP) swabs in vial transport medium  Result Value Ref Range   SARS Coronavirus 2 by RT PCR NEGATIVE NEGATIVE    Comment: (NOTE) SARS-CoV-2 target nucleic acids are NOT DETECTED.  The SARS-CoV-2 RNA is generally detectable in upper respiratory specimens during the acute phase of infection. The lowest concentration of SARS-CoV-2 viral copies this  assay can detect  is 138 copies/mL. A negative result does not preclude SARS-Cov-2 infection and should not be used as the sole basis for treatment or other patient management decisions. A negative result may occur with  improper specimen collection/handling, submission of specimen other than nasopharyngeal swab, presence of viral mutation(s) within the areas targeted by this assay, and inadequate number of viral copies(<138 copies/mL). A negative result must be combined with clinical observations, patient history, and epidemiological information. The expected result is Negative.  Fact Sheet for Patients:  EntrepreneurPulse.com.au  Fact Sheet for Healthcare Providers:  IncredibleEmployment.be  This test is no t yet approved or cleared by the [place] FDA and  has been authorized for detection and/or diagnosis of SARS-CoV-2 by FDA under an Emergency Use Authorization (EUA). This EUA will remain  in effect (meaning this test can be used) for the duration of the COVID-19 declaration under Section 564(b)(1) of the Act, 21 U.S.C.section 360bbb-3(b)(1), unless the authorization is terminated  or revoked sooner.       Influenza A by PCR NEGATIVE NEGATIVE   Influenza B by PCR NEGATIVE NEGATIVE    Comment: (NOTE) The Xpert Xpress SARS-CoV-2/FLU/RSV plus assay is intended as an aid in the diagnosis of influenza from Nasopharyngeal swab specimens and should not be used as a sole basis for treatment. Nasal washings and aspirates are unacceptable for Xpert Xpress SARS-CoV-2/FLU/RSV testing.  Fact Sheet for Patients: EntrepreneurPulse.com.au  Fact Sheet for Healthcare Providers: IncredibleEmployment.be  This test is not yet approved or cleared by the [place] FDA and has been authorized for detection and/or diagnosis of SARS-CoV-2 by FDA under an Emergency Use Authorization (EUA). This EUA will remain in effect (meaning this  test can be used) for the duration of the COVID-19 declaration under Section 564(b)(1) of the Act, 21 U.S.C. section 360bbb-3(b)(1), unless the authorization is terminated or revoked.  Performed at [facility], [place] [address] [place], [place] [address]    US Abdomen Limited RUQ (LIVER/GB)  Result Date: 08/19/2020 CLINICAL DATA:  Intermittent epigastric pain EXAM: ULTRASOUND ABDOMEN LIMITED RIGHT UPPER QUADRANT COMPARISON:  CT AP from 11/23/2014 FINDINGS: Gallbladder: Gallstones measuring up to 1.3 cm are noted. No gallbladder wall thickening, pericholecystic fluid or sonographic Murphy's sign. Common bile duct: Diameter: 10.3 mm. Liver: Increased echogenicity. Contour the liver is slightly irregular. Intrahepatic bile duct dilatation identified, mild. Portal vein is patent on color Doppler imaging with normal direction of blood flow towards the liver. Other: None. IMPRESSION: 1. Gallstones.  No secondary signs of acute cholecystitis. 2. Increase caliber of the common bile duct with mild intrahepatic biliary dilatation. Cannot exclude choledocholithiasis. Recommend further evaluation with MRCP/MRI with contrast material. 3. Increased echogenicity of the liver with slightly nodular contour. This could be better assessed with MRI of the abdomen. Electronically Signed   By: Kerby Moors M.D.   On: 08/19/2020 12:18   Anti-infectives (From admission, onward)   None       Assessment/Plan CHF (EF 25-30% on Echo 12/10/18) w/ AICF in place CAD s/p CABG on Eliquis (last dose 4/21 am) HTN PVD  DM2   Cholelithiasis Possible Choledocholithiasis Elevated LFT's - 75 y.o. male who had episode of epigastric abdominal pain on Sunday.  Symptoms have since resolved.  He was seen by his PCP on 4/20 and was found to have elevated LFTs and directed to the ED.  LFTs today are now downtrending.  WBC within normal limits.  Right upper quadrant ultrasound showed no signs of acute  cholecystitis.  It did  show increased caliber of CBD.  GI is planning MRCP to determine if patient needs ERCP.  Would recommend cardiac evaluation given patient's history of CHF/CAD for cardiac clearance before laparoscopic cholecystectomy during admission.  Patient's last dose of Eliquis was on 4/21.  Please continue to hold.  Okay with heparin drip. No current indication for antibiotics. We will follow with you.  FEN - CLD, IVF VTE - SCDs, heparin gtt ID - None indicated from a general surgery standpoint  Jillyn Ledger, [facility] 08/19/2020, 3:48 PM Please see Amion for pager number during day hours 7:00am-4:30pm

## 2020-08-19 NOTE — Consult Note (Addendum)
Cardiology Consultation:   Patient ID: [name] [name] MRN: [id number]; DOB: 06/07/1945  Admit date: 08/19/2020 Date of Consult: 08/19/2020  PCP:  Christain Sacramento, MD   [place]  Cardiologist:  Glori Bickers, MD  Electrophysiologist:  Virl Axe, MD    Patient Profile:   [name] [name] is a 75 y.o. male with a hx of CAD s/p CABG December 2018, ischemic cardiomyopathy, chronic systolic heart failure, s/p Medtronic ICD in 2019, diabetes mellitus, hypertension, PAF and history of bladder cancer s/p multiple rounds of BCG who is being seen today for the evaluation of Pre-op evaluation at the request of Dr. Lorin Mercy.   History of anterior STEMI and cardiogenic shock requiring Impella December 2018.  Cardiac cath with severe three-vessel disease. While on impella, pt developed sepsis with WBC ~ 40K, UCx + pseudomonas. Treated for AECOPD and completed ABX therapy. BCx negative. Stabilized and underwent CABG x 2 with LIMA to LAD and SVG to OM on 04/08/2017. Pt transiently required milrinone but weaned prior to discharge. Required amiodarone for brief post op afib, but had no recurrence.   Echo 08/07/17 EF 35-40% laminated apical clot. (Dr.Cathrine Krizan felt ~ 25-30%) and moderate to severe MR.   Cardiac MRI 09/2017 EF 30%, laminated apical thrombus, normal RV size and function, LGE pattern suggests infarction in LAD territory, unlikely to improve with revascularization  S/p Medtronic ICD with Dr Caryl Comes 11/20/17.  Noted in A. fib RVR when seen by Dr. Caryl Comes 01/07/2020.  Last echo  August 2020 showed LV function of 25 to 30%.  Small apical thrombus.  Last seen by Dr. Haroldine Laws October 2021. He was doing well on cardiac stand point.   He was doing well when seen by Dr. Caryl Comes July 04, 2020.  There may be consideration of discontinuation of Eliquis after discussion with Dr. Haroldine Laws.  History of Present Illness:   [name] [name] sent by PCP after abnormal blood work.  Sunday  patient had an episode of acute onset epigastric/right upper quadrant pain which lasted for about 12 hours.  It was associated with chills and subjective fever.  Tums did not help his pain.  Symptoms resolved but went to see PCP on April 24th and noted to have elevated LFTs and bilirubin of 3.4.  Lipase was 107.  He was sent to ER for further evaluation.  Similar episodes in past.   Right upper quadrant ultrasound showed gallstones without signs of acute cholecystitis.  Increase caliber of the common bile duct with mild intrahepatic biliary dilatation. Cannot exclude choledocholithiasis. Recommend further evaluation with MRCP/MRI with contrast material. Increased echogenicity of the liver with slightly nodular contour. This could be better assessed with MRI of the abdomen.   Seen by general surgery and recommended cardiac clearance.  Last dose of Eliquis PM of  4/21.  Okay to start heparin drip.  Plan to get MRI if patient has passed gallstone he is going to OR tomorrow morning.  At baseline patient is very active, he goes to [facility] 3 times per week and does lifting and exercise.  No exertional limitation.  He denies chest pain, shortness of breath, potation, dizziness orthopnea, PND, syncope, lower extremity edema or melena.   Past Medical History:  Diagnosis Date  . AICD (automatic cardioverter/defibrillator) present   . Arthritis   . Bladder cancer ([place])   . Borderline glaucoma   . Chronic systolic (congestive) heart failure ([place])   . Coronary artery disease   . Diabetes mellitus type 2, controlled (  [place]) ORAL MED  . Essential hypertension   . Heart murmur   . Hepatitis   . Peripheral vascular disease ([place])   . Pneumonia     Past Surgical History:  Procedure Laterality Date  . CAROTID DUPLEX SCAN  07-26-2010   BILATERAL ICA  STENOSIS 1% - 39%  . CORONARY ARTERY BYPASS GRAFT N/A 04/08/2017   Procedure: CORONARY ARTERY BYPASS GRAFTING (CABG) TIMES TWO USING LEFT INTERNAL MAMMARY ARTERY  AND LEFT SAPHENOUS LEG VEIN HARVESTED ENDOSCOPICALLY.  LEG VEIN ALSO HARVESTED FROM THE RIGHT LEG;  Surgeon: [name], MD;  Location: [place];  Service: Open Heart Surgery;  Laterality: N/A;  . CYSTOSCOPY WITH BIOPSY N/A 08/01/2012   Procedure: CYSTOSCOPY WITH BIOPSY BLADDER BIOPSY   ;  Surgeon: Fredricka Bonine, MD;  Location: [facility];  Service: Urology;  Laterality: N/A;  . FULGURATION OF BLADDER TUMOR N/A 08/01/2012   Procedure: FULGURATION OF BLADDER TUMOR;  Surgeon: Fredricka Bonine, MD;  Location: [facility];  Service: Urology;  Laterality: N/A;  . ICD IMPLANT  11/20/2017  . ICD IMPLANT N/A 11/20/2017   Procedure: ICD IMPLANT;  Surgeon: Deboraha Sprang, MD;  Location: [place] [facility];  Service: Cardiovascular;  Laterality: N/A;  . LEFT HEART CATH AND CORONARY ANGIOGRAPHY N/A 03/31/2017   Procedure: LEFT HEART CATH AND CORONARY ANGIOGRAPHY;  Surgeon: Leonie Man, MD;  Location: [place] [facility];  Service: Cardiovascular;  Laterality: N/A;  . LUMBAR LAMINECTOMY/DECOMPRESSION MICRODISCECTOMY Bilateral 06/16/2019   Procedure: Bilateral Lumbar Four-Five Laminectomy and Foraminotomy;  Surgeon: Earnie Larsson, MD;  Location: [facility];  Service: Neurosurgery;  Laterality: Bilateral;  posterior  . RIGHT HEART CATH Right 03/31/2017   Procedure: RIGHT HEART CATH;  Surgeon: Leonie Man, MD;  Location: [place] [facility];  Service: Cardiovascular;  Laterality: Right;  . TEE WITHOUT CARDIOVERSION N/A 04/08/2017   Procedure: TRANSESOPHAGEAL ECHOCARDIOGRAM (TEE);  Surgeon: [name], MD;  Location: [facility];  Service: Open Heart Surgery;  Laterality: N/A;  . TRANSURETHRAL RESECTION OF BLADDER TUMOR  05/29/2011   Procedure: TRANSURETHRAL RESECTION OF BLADDER TUMOR (TURBT);  Surgeon: Fredricka Bonine, MD;  Location: [facility];  Service: Urology;  Laterality: N/A;  . VENTRICULAR ASSIST DEVICE INSERTION Right 03/31/2017   Procedure:  VENTRICULAR ASSIST DEVICE INSERTION;  Surgeon: Leonie Man, MD;  Location: [facility];  Service: Cardiovascular;  Laterality: Right;    Inpatient Medications: Scheduled Meds: . [START ON 08/20/2020] atorvastatin  80 mg Oral Daily  . [START ON 08/20/2020] carvedilol  3.125 mg Oral BID WC  . docusate sodium  100 mg Oral BID  . insulin aspart  0-15 Units Subcutaneous TID WC  . [START ON 08/20/2020] pantoprazole  40 mg Oral Daily  . sacubitril-valsartan  1 tablet Oral BID   Continuous Infusions: . heparin    . lactated ringers     PRN Meds: acetaminophen **OR** acetaminophen, bisacodyl, hydrALAZINE, HYDROcodone-acetaminophen, morphine injection, ondansetron **OR** ondansetron (ZOFRAN) IV, polyethylene glycol  Allergies:    Allergies  Allergen Reactions  . Spironolactone Other (See Comments)    Painful gynecomastia  . Niacin And Related     Social History:   Social History   Socioeconomic History  . Marital status: Married    Spouse name: Not on file  . Number of children: Not on file  . Years of education: Not on file  . Highest education level: Not on file  Occupational History  . Occupation:  retired  Tobacco Use  . Smoking status: Former Smoker    Packs/day: 1.00    Years: 40.00    Pack years: 40.00    Types: Cigarettes    Quit date: 03/31/2017    Years since quitting: 3.3  . Smokeless tobacco: Never Used  Vaping Use  . Vaping Use: Never used  Substance and Sexual Activity  . Alcohol use: Not Currently    Comment: rarely  . Drug use: No  . Sexual activity: Not on file  Other Topics Concern  . Not on file  Social History Narrative  . Not on file   Social Determinants of Health   Financial Resource Strain: Not on file  Food Insecurity: Not on file  Transportation Needs: Not on file  Physical Activity: Not on file  Stress: Not on file  Social Connections: Not on file  Intimate Partner Violence: Not on file    Family History:   Family History   Problem Relation Age of Onset  . Heart attack Sister   . Heart disease Sister      ROS:  Please see the history of present illness.  All other ROS reviewed and negative.     Physical Exam/Data:   Vitals:   08/19/20 1415 08/19/20 1430 08/19/20 1500 08/19/20 1617  BP: (!) 134/56 131/84 (!) 148/87 (!) 187/86  Pulse: 61 65 63 83  Resp: 13 13 17 17   Temp:    97.9 F (36.6 C)  TempSrc:    Oral  SpO2: 93% 97% 97% 97%  Weight:      Height:        Intake/Output Summary (Last 24 hours) at 08/19/2020 1642 Last data filed at 08/19/2020 1153 Gross per 24 hour  Intake 1000 ml  Output --  Net 1000 ml   Last 3 Weights 08/19/2020 07/04/2020 02/22/2020  Weight (lbs) 208 lb 210 lb 209 lb 3.2 oz  Weight (kg) 94.348 kg 95.255 kg 94.892 kg     Body mass index is 26.71 kg/m.  General:  Well nourished, well developed, in no acute distress HEENT: normal Lymph: no adenopathy Neck: no JVD Endocrine:  No thryomegaly Vascular: No carotid bruits; FA pulses 2+ bilaterally without bruits  Cardiac:  normal S1, S2; RRR; no murmur Lungs:  clear to auscultation bilaterally, no wheezing, rhonchi or rales  Abd: soft, nontender, no hepatomegaly  Ext: no edema Musculoskeletal:  No deformities, BUE and BLE strength normal and equal Skin: warm and dry  Neuro:  CNs 2-12 intact, no focal abnormalities noted Psych:  Normal affect   EKG:  The EKG was personally reviewed and demonstrates: Sinus bradycardia at rate of 56 bpm Telemetry:  Telemetry was personally reviewed and demonstrates:  SR with PVCs  Relevant CV Studies:  Echo 11/2018 1. Severe akinesis of the left ventricular, mid-apical inferior wall,  apical segment, anteroseptal wall, anterolateral wall and inferolateral  wall.  2. The left ventricle has severely reduced systolic function, with an  ejection fraction of 25-30%. The cavity size was normal. There is mildly  increased left ventricular wall thickness. Left ventricular diastolic   Doppler parameters are consistent with  impaired relaxation.  3. 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  This is clearly improved from prior study.  4. The right ventricle has normal systolic function. The cavity was  normal. There is no increase in right ventricular wall thickness.  5. Mild thickening of the mitral valve leaflet. There is moderate mitral  annular calcification present.  6. The aortic valve is tricuspid. Mild thickening of the aortic valve.  Mild calcification of the aortic valve. Aortic valve regurgitation was not  assessed by color flow Doppler. Moderate aortic annular calcification  noted.  7. The aorta is normal unless otherwise noted.  LEFT HEART CATH AND CORONARY ANGIOGRAPHY  03/31/2017  RIGHT HEART CATH  VENTRICULAR ASSIST DEVICE INSERTION    Conclusion    Prox RCA lesion is 100% stenosed. Dist RCA lesion is 100% stenosed (retrograde filling from collaterals does not go beyond the distal vessel) -->faint collateral filling to the distal PL and PDA from septal perforators and a diffusely diseased AV groove circumflex  LAV Groove lesion is 90% stenosed.  Prox LAD-1 lesion is 95% stenosed. Prox LAD-2 lesion is 85% stenosed. Heavily calcified, tandem lesions in very tortuous segment of the vessel.  Prox Cx to Mid Cx lesion is 70% stenosed. Heavily calcified  There is severe left ventricular systolic dysfunction. The left ventricular ejection fraction is less than 25% by visual estimate.  LV end diastolic pressure is severely elevated -prior to Impella insertion  Hemodynamic findings consistent with moderate pulmonary hypertension.  Successful Impella insertion with 3.5-3.6 LPM flow's   The patient has severe native coronary disease with an occluded RCA and extensive calcified disease in the LAD including 2 severe lesions that are not favorable for PCI based on the tortuosity, heavily calcified segment before and after the  lesions.  Likely not to get a good outcome without atherectomy which is dangerous given the tortuosity. There is also diffuse calcified disease in the circumflex that appears to be at least 70%.  The patient has severe ischemic cardiomyopathy and presented in acute hypoxic respiratory failure with acute systolic and diastolic heart failure now stabilized on nasal cannula oxygen with Impella in place.  Relatively hemodynamically stable.  Bedside limited stat echocardiogram was performed in the Cath Lab to ensure adequate Impella placement.  Assistance with Impella device representative was appreciated.  Plan: After discussion with Dr. Haroldine Laws and reviewing the images, we both agreed that attempted PCI on a Sunday evening in this critically ill patient is potentially dangerous.  Admit to ICU/CCU with standard post Impella care and monitoring.  Diuresis Via Lasix drip.  Okay to insert Foley catheter  Cycle cardiac enzymes.  Plan will be to "tune up" with diuresis and Impella and contact CT surgery in the morning to consider CABG.  We will hold amlodipine and ACE inhibitor, but continue low-dose beta-blocker given his sinus tachycardia until his blood pressure stabilized.  *  Laboratory Data:  High Sensitivity Troponin:  No results for input(s): TROPONINIHS in the last 720 hours.   Chemistry Recent Labs  Lab 08/19/20 0930  NA 138  K 4.8  CL 107  CO2 23  GLUCOSE 157*  BUN 26*  CREATININE 1.87*  CALCIUM 9.4  GFRNONAA 37*  ANIONGAP 8    Recent Labs  Lab 08/19/20 0930  PROT 6.8  ALBUMIN 3.4*  AST 57*  ALT 94*  ALKPHOS 310*  BILITOT 1.4*   Hematology Recent Labs  Lab 08/19/20 0930  WBC 7.9  RBC 5.76  HGB 16.8  HCT 52.6*  MCV 91.3  MCH 29.2  MCHC 31.9  RDW 15.5  PLT 187   BNPNo results for input(s): BNP, PROBNP in the last 168 hours.  DDimer No results for input(s): DDIMER in the last 168 hours.   Radiology/Studies:  US Abdomen Limited RUQ  (LIVER/GB)  Result Date: 08/19/2020 CLINICAL DATA:  Intermittent epigastric  pain EXAM: ULTRASOUND ABDOMEN LIMITED RIGHT UPPER QUADRANT COMPARISON:  CT AP from 11/23/2014 FINDINGS: Gallbladder: Gallstones measuring up to 1.3 cm are noted. No gallbladder wall thickening, pericholecystic fluid or sonographic Murphy's sign. Common bile duct: Diameter: 10.3 mm. Liver: Increased echogenicity. Contour the liver is slightly irregular. Intrahepatic bile duct dilatation identified, mild. Portal vein is patent on color Doppler imaging with normal direction of blood flow towards the liver. Other: None. IMPRESSION: 1. Gallstones.  No secondary signs of acute cholecystitis. 2. Increase caliber of the common bile duct with mild intrahepatic biliary dilatation. Cannot exclude choledocholithiasis. Recommend further evaluation with MRCP/MRI with contrast material. 3. Increased echogenicity of the liver with slightly nodular contour. This could be better assessed with MRI of the abdomen. Electronically Signed   By: Kerby Moors M.D.   On: 08/19/2020 12:18     Assessment and Plan:   1. CAD s/p CABG (LIMA to LAD and SVG to OM) -No angina.  Patient is very active. -Continue statin and beta-blocker -Not on aspirin due to need of anticoagulation.  2.  Chronic systolic heart failure secondary to ischemic cardiomyopathy s/p ICD -EF of 15 to 20% 03/2017>> improved to 25-30% on guideline directed therapy -Continue low-dose Coreg at 3.125 mg twice daily (unable to titrate further due to chronotropic incompetence with exercise heart rate in the 80s) -Gynecomastia on spironolactone -On Entresto and Jardiance  3.  Paroxysmal atrial fibrillation 4.  LV clot 5.  Chronic anticoagulation - laminated apical thrombus on last echo in 2020 -Patient has been anticoagulated with Eliquis. - Per Klein's note 07/04/20 "Some nuisance bleeding.  Wonder whether it is appropriate to think about discontinuing his Eliquis.  Will reach out to  Dr. Reine Just.  Joycelyn Schmid is not sufficiently long to justify on its own". -Held Eliquis for surgery>>>transition to heparin  6.  Acute on chronic kidney disease stage III -Baseline creatinine appears in 1.4 range - Scr of 1.87 today, likely due to acute illness -Can hold Entresto and Jardiance (Dr. Haroldine Laws to review)  7.  Preoperative clearance -Plan to take 2 OR based on MRI result -Last dose of Eliquis p.m. of 4/21>> start heparin GTT -Patient is very active at baseline and able to achieve at least 4 METS of activity easily.  No signs of angina or CHF.   Dr. Haroldine Laws to see  Risk Assessment/Risk Scores:    New York Heart Association (NYHA) Functional Class NYHA Class I  CHA2DS2-VASc Score = 6  {This indicates a 9.7% annual risk of stroke. The patient's score is based upon: CHF History: Yes HTN History: Yes Diabetes History: Yes Stroke History: No Vascular Disease History: Yes Age Score: 2 Gender Score: 0     For questions or updates, please contact [place] Please consult [email or web address] for contact info under    Jarrett Soho, [place]  08/19/2020 4:42 PM   Patient seen and examined with the above-signed Advanced Practice Provider and/or Housestaff. I personally reviewed laboratory data, imaging studies and relevant notes. I independently examined the patient and formulated the important aspects of the plan. I have edited the note to reflect any of my changes or salient points. I have personally discussed the plan with the patient and/or family.  74 y/o male well known to me from [facility]. He has CAD with previous large anterior MI associated with ischemic CM and EF 25-30%. He is s/p prophylactic single chamber ICD placement.   Despite his cardiac condition he has done very well. Has remained active going  to gym regularly without angina or significant HF symptoms. His ICD has not fired.  He now presents with subacute choledocholithiasis with persistent dilation  of CBD.   He has been seen by [place] who has recommended MRCP to help determine need of ERCP prior to cholecystectomy  General:  Lying in bed  No resp difficulty HEENT: normal Neck: supple. no JVD. Carotids 2+ bilat; no bruits. No lymphadenopathy or thryomegaly appreciated. Cor: PMI nondisplaced. Regular rate & rhythm. No rubs, gallops or murmurs. Lungs: clear Abdomen: soft, nontender, nondistended. No hepatosplenomegaly. No bruits or masses. Good bowel sounds. Extremities: no cyanosis, clubbing, rash, edema Neuro: alert & orientedx3, cranial nerves grossly intact. moves all 4 extremities w/o difficulty. Affect pleasant  From cardiac standpoint he is at moderate risk for CV complications from surgery based on his known CAD and reduced EF. However, he has been quite functional without significant cardiac limitation. Thus I think it would be safe for him to proceed to surgery without further cardiac testing or intervention. OK to hold Eliquis for up to 48 hours prior to surgery without heparin bridge.   I discussed personally with Dr. Lorin Mercy and I have also had multiple discussions with Radiology and [employer] device rep to facilitate MRI with ICD in place.   Glori Bickers, MD  6:55 PM

## 2020-08-20 ENCOUNTER — Encounter (HOSPITAL_COMMUNITY): Payer: Self-pay | Admitting: Internal Medicine

## 2020-08-20 DIAGNOSIS — I480 Paroxysmal atrial fibrillation: Secondary | ICD-10-CM

## 2020-08-20 DIAGNOSIS — K8070 Calculus of gallbladder and bile duct without cholecystitis without obstruction: Secondary | ICD-10-CM

## 2020-08-20 HISTORY — DX: Paroxysmal atrial fibrillation: I48.0

## 2020-08-20 LAB — GLUCOSE, CAPILLARY
Glucose-Capillary: 98 mg/dL (ref 70–99)
Glucose-Capillary: 105 mg/dL — ABNORMAL HIGH (ref 70–99)
Glucose-Capillary: 229 mg/dL — ABNORMAL HIGH (ref 70–99)
Glucose-Capillary: 124 mg/dL — ABNORMAL HIGH (ref 70–99)

## 2020-08-20 LAB — CBC
HCT: 53.4 % — ABNORMAL HIGH (ref 39.0–52.0)
Hemoglobin: 16.7 g/dL (ref 13.0–17.0)
MCH: 28.7 pg (ref 26.0–34.0)
MCHC: 31.3 g/dL (ref 30.0–36.0)
MCV: 91.8 fL (ref 80.0–100.0)
Platelets: 187 10*3/uL (ref 150–400)
RBC: 5.82 MIL/uL — ABNORMAL HIGH (ref 4.22–5.81)
RDW: 15.3 % (ref 11.5–15.5)
WBC: 9.2 10*3/uL (ref 4.0–10.5)
nRBC: 0 % (ref 0.0–0.2)

## 2020-08-20 LAB — COMPREHENSIVE METABOLIC PANEL
ALT: 125 U/L — ABNORMAL HIGH (ref 0–44)
AST: 110 U/L — ABNORMAL HIGH (ref 15–41)
Albumin: 3.4 g/dL — ABNORMAL LOW (ref 3.5–5.0)
Alkaline Phosphatase: 267 U/L — ABNORMAL HIGH (ref 38–126)
Anion gap: 12 (ref 5–15)
BUN: 24 mg/dL — ABNORMAL HIGH (ref 8–23)
CO2: 21 mmol/L — ABNORMAL LOW (ref 22–32)
Calcium: 9.2 mg/dL (ref 8.9–10.3)
Chloride: 104 mmol/L (ref 98–111)
Creatinine, Ser: 1.65 mg/dL — ABNORMAL HIGH (ref 0.61–1.24)
GFR, Estimated: 43 mL/min — ABNORMAL LOW (ref >60)
Glucose, Bld: 113 mg/dL — ABNORMAL HIGH (ref 70–99)
Potassium: 4.9 mmol/L (ref 3.5–5.1)
Sodium: 137 mmol/L (ref 135–145)
Total Bilirubin: 1.9 mg/dL — ABNORMAL HIGH (ref 0.3–1.2)
Total Protein: 6.7 g/dL (ref 6.5–8.1)

## 2020-08-20 LAB — APTT
aPTT: 128 seconds — ABNORMAL HIGH (ref 24–36)
aPTT: 111 seconds — ABNORMAL HIGH (ref 24–36)

## 2020-08-20 LAB — HEPARIN LEVEL (UNFRACTIONATED)
Heparin Unfractionated: >1.1 IU/mL — ABNORMAL HIGH (ref 0.30–0.70)
Heparin Unfractionated: 0.95 IU/mL — ABNORMAL HIGH (ref 0.30–0.70)

## 2020-08-20 MED ORDER — SACUBITRIL-VALSARTAN 97-103 MG PO TABS
1.0000 | ORAL_TABLET | Freq: Two times a day (BID) | ORAL | Status: DC
  Administered 2020-08-21 (×2): 1 via ORAL
  Filled 2020-08-20 (×4): qty 1

## 2020-08-20 NOTE — Progress Notes (Signed)
Heparin gtts rate updated. Patient denied any bleeding. No bleeding noted upon assessment.

## 2020-08-20 NOTE — Progress Notes (Signed)
Progress Note    [name]  [id number] DOB: 09/13/1945  DOA: 08/19/2020 PCP: Christain Sacramento, MD    Brief Narrative:   Medical records reviewed and are as summarized below:  [name] [name] is an 75 y.o. male with medical history significant of PVD; HTN; DM; CAD; bladder cancer; and chronic systolic CHF with AICD presenting with epigastric pain.  Sunday night, he developed midepigastric pain - this has happened before, like a strong burning sensation.  Lasted about 12 hours.  At 2 points in that time, he was shivering and felt febrile.  This was similar to prior episodes.  It resolved about 10AM the following day.  He has continued to have bloating and belching.  He saw his PCP on 4/20 and had blood work and they called today and told him to come to the ER.    Assessment/Plan:   Principal Problem:   Cholelithiasis with choledocholithiasis Active Problems:   Essential hypertension   Diabetes mellitus type 2, controlled (HCC)   CAD (coronary artery disease)   Chronic combined systolic and diastolic CHF (congestive heart failure) (HCC)   CKD (chronic kidney disease), stage III ([place])   Choledocholithiasis with recent biliary obstruction -Patient with acute onset of epigastric pain on Sunday; completely resolved at this time -He was seen by his PCP on 4/20 and blood work was clearly concerning for biliary obstruction; results returned today and patient was sent to ED -Despite being pain-free, he had recent bile duct obstruction and has persistent ductal dilatation with gallstones -s/p MRCP: Multiple common duct choledocholithiasis measuring up to 8 mm with a 6 mm choledocholithiasis in the cystic duct as well as mild intra and extrahepatic biliary ductal dilation. 2. Cholelithiasis with gallbladder distension but without other findings acute cholecystitis. -GI recs: ERCP in AM -general surgery following  AKI with stage 3a CKD -Baseline creatinine 1.4 -Creatinine up to  2.23 on 4/20  -Current back to 1.87 -Will continue to hydrate and follow  Chronic systolic CHF with AICD -Does not have pacer; AICD has never fired -Heparin in lieu of Eliquis -Continue Entresto, Coreg -Cards consult: From cardiac standpoint he is at moderate risk for CV complications from surgery based on his known CAD and reduced EF. However, he has been quite functional without significant cardiac limitation. Thus I think it would be safe for him to proceed to surgery with further cardiac testing or intervention. OK to hold Eliquis for up to 48 hours prior to surgery without heparin bridge.   HTN -Continue Entresto, Coreg  DM -A1c is 8, indicating poor control -hold Glucophage, Jardiance -Cover with moderate-scale SSI    Family Communication/Anticipated D/C date and plan/Code Status   DVT prophylaxis: heparin gtt Code Status: Full Code.   Disposition Plan: Status is: Inpatient  Remains inpatient appropriate because:Inpatient level of care appropriate due to severity of illness   Dispo: The patient is from: Home              Anticipated d/c is to: Home              Patient currently is not medically stable to d/c.   Difficult to place patient No         Medical Consultants:    GI  GS  Subjective:   No SOB, no CP-- wants to know the plan  Objective:    Vitals:   08/19/20 1944 08/19/20 2324 08/20/20 0404 08/20/20 1222  BP: (!) 154/74 (!) 152/67 (!) 142/66 [name])  169/83  Pulse: 64 61 70 67  Resp: 18 14 17 16   Temp: 98.5 F (36.9 C) 98.4 F (36.9 C) 98.8 F (37.1 C) 98.4 F (36.9 C)  TempSrc: Oral Oral Oral Oral  SpO2: 97% 98% 97%   Weight:      Height:        Intake/Output Summary (Last 24 hours) at 08/20/2020 1359 Last data filed at 08/20/2020 1311 Gross per 24 hour  Intake 1394.59 ml  Output 1200 ml  Net 194.59 ml   Filed Weights   08/19/20 0923  Weight: 94.3 kg    Exam:  General: Appearance:     Overweight male in no acute distress      Lungs:     respirations unlabored  Heart:    Normal heart rate. Normal rhythm. No murmurs, rubs, or gallops.   MS:   All extremities are intact.   Neurologic:   Awake, alert, oriented x 3. No apparent focal neurological           defect.     Data Reviewed:   I have personally reviewed following labs and imaging studies:  Labs: Labs show the following:   Basic Metabolic Panel: Recent Labs  Lab 08/19/20 0930 08/20/20 0805  NA 138 137  K 4.8 4.9  CL 107 104  CO2 23 21*  GLUCOSE 157* 113*  BUN 26* 24*  CREATININE 1.87* 1.65*  CALCIUM 9.4 9.2   GFR Estimated Creatinine Clearance: 45 mL/min (A) (by C-G formula based on SCr of 1.65 mg/dL (H)). Liver Function Tests: Recent Labs  Lab 08/19/20 0930 08/20/20 0805  AST 57* 110*  ALT 94* 125*  ALKPHOS 310* 267*  BILITOT 1.4* 1.9*  PROT 6.8 6.7  ALBUMIN 3.4* 3.4*   Recent Labs  Lab 08/19/20 0930  LIPASE 54*   No results for input(s): AMMONIA in the last 168 hours. Coagulation profile Recent Labs  Lab 08/19/20 0935  INR 1.0    CBC: Recent Labs  Lab 08/19/20 0930 08/20/20 0805  WBC 7.9 9.2  HGB 16.8 16.7  HCT 52.6* 53.4*  MCV 91.3 91.8  PLT 187 187   Cardiac Enzymes: No results for input(s): CKTOTAL, CKMB, CKMBINDEX, TROPONINI in the last 168 hours. BNP (last 3 results) No results for input(s): PROBNP in the last 8760 hours. CBG: Recent Labs  Lab 08/19/20 1714 08/19/20 2323 08/20/20 0622 08/20/20 1119  GLUCAP 129* 103* 98 105*   D-Dimer: No results for input(s): DDIMER in the last 72 hours. Hgb A1c: Recent Labs    08/19/20 0930  HGBA1C 8.0*   Lipid Profile: No results for input(s): CHOL, HDL, LDLCALC, TRIG, CHOLHDL, LDLDIRECT in the last 72 hours. Thyroid function studies: No results for input(s): TSH, T4TOTAL, T3FREE, THYROIDAB in the last 72 hours.  Invalid input(s): FREET3 Anemia work up: No results for input(s): VITAMINB12, FOLATE, FERRITIN, TIBC, IRON, RETICCTPCT in the last 72  hours. Sepsis Labs: Recent Labs  Lab 08/19/20 0930 08/20/20 0805  WBC 7.9 9.2    Microbiology Recent Results (from the past 240 hour(s))  Resp Panel by RT-PCR (Flu A&B, Covid) Nasopharyngeal Swab     Status: None   Collection Time: 08/19/20  9:37 AM   Specimen: Nasopharyngeal Swab; Nasopharyngeal(NP) swabs in vial transport medium  Result Value Ref Range Status   SARS Coronavirus 2 by RT PCR NEGATIVE NEGATIVE Final    Comment: (NOTE) SARS-CoV-2 target nucleic acids are NOT DETECTED.  The SARS-CoV-2 RNA is generally detectable in upper respiratory specimens during  the acute phase of infection. The lowest concentration of SARS-CoV-2 viral copies this assay can detect is 138 copies/mL. A negative result does not preclude SARS-Cov-2 infection and should not be used as the sole basis for treatment or other patient management decisions. A negative result may occur with  improper specimen collection/handling, submission of specimen other than nasopharyngeal swab, presence of viral mutation(s) within the areas targeted by this assay, and inadequate number of viral copies(<138 copies/mL). A negative result must be combined with clinical observations, patient history, and epidemiological information. The expected result is Negative.  Fact Sheet for Patients:  EntrepreneurPulse.com.au  Fact Sheet for Healthcare Providers:  IncredibleEmployment.be  This test is no t yet approved or cleared by the [place] FDA and  has been authorized for detection and/or diagnosis of SARS-CoV-2 by FDA under an Emergency Use Authorization (EUA). This EUA will remain  in effect (meaning this test can be used) for the duration of the COVID-19 declaration under Section 564(b)(1) of the Act, 21 U.S.C.section 360bbb-3(b)(1), unless the authorization is terminated  or revoked sooner.       Influenza A by PCR NEGATIVE NEGATIVE Final   Influenza B by PCR NEGATIVE  NEGATIVE Final    Comment: (NOTE) The Xpert Xpress SARS-CoV-2/FLU/RSV plus assay is intended as an aid in the diagnosis of influenza from Nasopharyngeal swab specimens and should not be used as a sole basis for treatment. Nasal washings and aspirates are unacceptable for Xpert Xpress SARS-CoV-2/FLU/RSV testing.  Fact Sheet for Patients: EntrepreneurPulse.com.au  Fact Sheet for Healthcare Providers: IncredibleEmployment.be  This test is not yet approved or cleared by the [place] FDA and has been authorized for detection and/or diagnosis of SARS-CoV-2 by FDA under an Emergency Use Authorization (EUA). This EUA will remain in effect (meaning this test can be used) for the duration of the COVID-19 declaration under Section 564(b)(1) of the Act, 21 U.S.C. section 360bbb-3(b)(1), unless the authorization is terminated or revoked.  Performed at [facility], [place] [address] [place], [place] [address]     Procedures and diagnostic studies:  MR ABDOMEN MRCP W WO CONTAST  Result Date: 08/19/2020 CLINICAL DATA:  Abdominal pain biliary obstruction suspected. EXAM: MRI ABDOMEN WITHOUT AND WITH CONTRAST (INCLUDING MRCP) TECHNIQUE: Multiplanar multisequence MR imaging of the abdomen was performed both before and after the administration of intravenous contrast. Heavily T2-weighted images of the biliary and pancreatic ducts were obtained, and three-dimensional MRCP images were rendered by post processing. CONTRAST:  8.75mL GADAVIST GADOBUTROL 1 MMOL/ML IV SOLN COMPARISON:  Day abdominal ultrasound. CT abdomen pelvis November 23, 2014. FINDINGS: Lower chest: No acute abnormality. Median sternotomy with postsurgical change in the anterior abdominal wall. Hepatobiliary: No hepatic steatosis. No suspicious hepatic lesions. Cholelithiasis gallbladder distension but without wall thickening or pericholecystic fluid. There is mild intrahepatic biliary ductal dilation.  The common duct is dilated measuring 10 mm with mild likely reactive peribiliary enhancement. There are multiple filling defects in the common bile duct measuring up to 8 mm on image 12/18. The cystic duct is dilated with a 6 mm stone in the duct at the confluence with the common duct on image 10/18. Pancreas: No mass, inflammatory changes, or other parenchymal abnormality identified. Spleen:  Within normal limits in size and appearance. Adrenals/Urinary Tract: Bilateral adrenal glands are unremarkable. 8 mm Bosniak category 2 hemorrhagic/proteinaceous right renal cyst. 5 mm Bosniak category left renal cyst. No solid enhancing renal lesions. No evidence of hydronephrosis. Stomach/Bowel: Visualized portions within the abdomen are unremarkable.  Vascular/Lymphatic: Aortic atherosclerosis without aneurysmal dilation. Pathologically enlarged abdominal lymph nodes. Other:  No abdominal ascites which Musculoskeletal: No suspicious bone lesions identified. IMPRESSION: 1. Multiple common duct choledocholithiasis measuring up to 8 mm with a 6 mm choledocholithiasis in the cystic duct as well as mild intra and extrahepatic biliary ductal dilation. 2. Cholelithiasis with gallbladder distension but without other findings acute cholecystitis. These results will be called to the ordering clinician or representative by the Radiologist Assistant, and communication documented in the PACS or [employer]. Electronically Signed   By: Dahlia Bailiff MD   On: 08/19/2020 23:17   US Abdomen Limited RUQ (LIVER/GB)  Result Date: 08/19/2020 CLINICAL DATA:  Intermittent epigastric pain EXAM: ULTRASOUND ABDOMEN LIMITED RIGHT UPPER QUADRANT COMPARISON:  CT AP from 11/23/2014 FINDINGS: Gallbladder: Gallstones measuring up to 1.3 cm are noted. No gallbladder wall thickening, pericholecystic fluid or sonographic Murphy's sign. Common bile duct: Diameter: 10.3 mm. Liver: Increased echogenicity. Contour the liver is slightly irregular.  Intrahepatic bile duct dilatation identified, mild. Portal vein is patent on color Doppler imaging with normal direction of blood flow towards the liver. Other: None. IMPRESSION: 1. Gallstones.  No secondary signs of acute cholecystitis. 2. Increase caliber of the common bile duct with mild intrahepatic biliary dilatation. Cannot exclude choledocholithiasis. Recommend further evaluation with MRCP/MRI with contrast material. 3. Increased echogenicity of the liver with slightly nodular contour. This could be better assessed with MRI of the abdomen. Electronically Signed   By: Kerby Moors M.D.   On: 08/19/2020 12:18    Medications:   . atorvastatin  80 mg Oral Daily  . carvedilol  3.125 mg Oral BID WC  . docusate sodium  100 mg Oral BID  . insulin aspart  0-15 Units Subcutaneous TID WC  . pantoprazole  40 mg Oral Daily  . sacubitril-valsartan  1 tablet Oral BID   Continuous Infusions: . heparin 1,050 Units/hr (08/20/20 1310)  . lactated ringers 25 mL/hr at 08/19/20 1916     LOS: 1 day   Geradine Girt  [facility]   How to contact the [facility] Attending or Consulting provider [place] or covering provider during after hours [place], for this patient?  1. Check the care team in [facility] and look for a) attending/consulting [facility] provider listed and b) the [facility] team listed 2. Log into [email or web address] and use [place]'s universal password to access. If you do not have the password, please contact the hospital operator. 3. Locate the [facility] provider you are looking for under [facility] and page to a number that you can be directly reached. 4. If you still have difficulty reaching the provider, please page the [facility] (Director on Call) for the Hospitalists listed on amion for assistance.  08/20/2020, 1:59 PM

## 2020-08-20 NOTE — Progress Notes (Signed)
ANTICOAGULATION CONSULT NOTE - Initial Consult  Pharmacy Consult for heparin Indication: atrial fibrillation  Allergies  Allergen Reactions  . Spironolactone Other (See Comments)    Painful gynecomastia  . Niacin And Related     Patient Measurements: Height: 6\' 2"  (188 cm) Weight: 94.3 kg (208 lb) IBW/kg (Calculated) : 82.2 Heparin Dosing Weight: TBW  Vital Signs: Temp: 98.8 F (37.1 C) (04/23 0404) Temp Source: Oral (04/23 0404) BP: 142/66 (04/23 0404) Pulse Rate: 70 (04/23 0404)  Labs: Recent Labs    08/19/20 0930 08/19/20 0935 08/20/20 0805  HGB 16.8  --  16.7  HCT 52.6*  --  53.4*  PLT 187  --  187  APTT  --   --  128*  LABPROT  --  13.4  --   INR  --  1.0  --   HEPARINUNFRC  --   --  >1.10*  CREATININE 1.87*  --  1.65*    Estimated Creatinine Clearance: 45 mL/min (A) (by C-G formula based on SCr of 1.65 mg/dL (H)).   Medical History: Past Medical History:  Diagnosis Date  . AICD (automatic cardioverter/defibrillator) present   . Arthritis   . Bladder cancer ([place])   . Borderline glaucoma   . Chronic systolic (congestive) heart failure ([place])   . Coronary artery disease   . Diabetes mellitus type 2, controlled ([place]) ORAL MED  . Essential hypertension   . Heart murmur   . Hepatitis   . Peripheral vascular disease ([place])   . Pneumonia    Assessment: 27 YOM presenting with abd pain, possible choledocholithiasis, hx of afib on Eliquis, holding for possible GI procedure.  Last dose of Eliquis 4/21 @1800 . 8-hour HL and aPTT not obtained overnight. Ordered STAT HL/aPTT. HL>1.10 and aPTT 128, both elevated. Spoke with nurse, no signs of bleeding noted and level appeared to be drawn correctly.   Goal of Therapy:  Heparin level 0.3-0.7 units/ml aPTT 66-102 seconds Monitor platelets by anticoagulation protocol: Yes   Plan:  Decreased heparin gtt to 1050 units/hr F/u 8 hour aPTT/HL F/u intervention plans and ability to transition back to [place],  PharmD, [place] Resident [phone] 08/20/2020 9:12 AM

## 2020-08-20 NOTE — H&P (View-Only) (Signed)
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 Benson Norway.  I discussed risks and benefits with the patient in detail.  He is already aware of the procedure. - Hold heparin 6 hours before. - Lap chole  planned by surgery before discharge.    I have discussed with Dr. Benson Norway.   SUBJECTIVE     No abdominal pain.   MRCP shows choledocholithiasis.   No nausea, vomiting, jaundice   OBJECTIVE:     Vital signs in last 24 hours: Temp:  [97.6 F (36.4 C)-98.8 F (37.1 C)] 98.4 F (36.9 C) (04/23 1222) Pulse Rate:  [61-83] 67 (04/23 1222) Resp:  [13-18] 16 (04/23 1222) BP: (131-187)/(56-88) 169/83 (04/23 1222) SpO2:  [93 %-99 %] 97 % (04/23 0404) Last BM Date: 08/18/20 General:   Alert, well-developed male in NAD EENT:  Normal hearing, non icteric sclera, conjunctive pink.  Abdomen:  Soft, nondistended, nontender.  Normal bowel sounds,.       Neurologic:  Alert and  oriented x4;  grossly normal neurologically. Psych:  Pleasant, cooperative.  Normal mood and affect.   Intake/Output from previous day: 04/22 0701 - 04/23 0700 In: 1480 [P.O.:480; I.V.:1000] Out: 1200 [Urine:1200] Intake/Output this shift: Total I/O In: 914.6 [P.O.:240; I.V.:674.6] Out: -   Lab Results: Recent Labs    08/19/20 0930 08/20/20 0805  WBC 7.9 9.2  HGB 16.8 16.7  HCT 52.6* 53.4*  PLT 187 187   BMET Recent Labs    08/19/20 0930 08/20/20 0805  NA 138 137  K 4.8 4.9  CL 107 104  CO2 23 21*  GLUCOSE 157* 113*  BUN 26* 24*  CREATININE 1.87* 1.65*  CALCIUM 9.4 9.2   LFT Recent Labs    08/20/20 0805  PROT 6.7  ALBUMIN 3.4*  AST 110*  ALT 125*  ALKPHOS 267*  BILITOT 1.9*   PT/INR Recent Labs    08/19/20 0935  LABPROT 13.4  INR 1.0   Hepatitis Panel No results for input(s): HEPBSAG, HCVAB, HEPAIGM, HEPBIGM in the  last 72 hours.  MR ABDOMEN MRCP W WO CONTAST  Result Date: 08/19/2020 CLINICAL DATA:  Abdominal pain biliary obstruction suspected. EXAM: MRI ABDOMEN WITHOUT AND WITH CONTRAST (INCLUDING MRCP) TECHNIQUE: Multiplanar multisequence MR imaging of the abdomen was performed both before and after the administration of intravenous contrast. Heavily T2-weighted images of the biliary and pancreatic ducts were obtained, and three-dimensional MRCP images were rendered by post processing. CONTRAST:  8.10mL GADAVIST GADOBUTROL 1 MMOL/ML IV SOLN COMPARISON:  Day abdominal ultrasound. CT abdomen pelvis November 23, 2014. FINDINGS: Lower chest: No acute abnormality. Median sternotomy with postsurgical change in the anterior abdominal wall. Hepatobiliary: No hepatic steatosis. No suspicious hepatic lesions. Cholelithiasis gallbladder distension but without wall thickening or pericholecystic fluid. There is mild intrahepatic biliary ductal dilation. The common duct is dilated measuring 10 mm with mild likely reactive peribiliary enhancement. There are multiple filling defects in the common bile duct measuring up to 8 mm on image 12/18. The cystic duct is dilated with a 6 mm stone in the duct at the confluence with the common duct on image 10/18. Pancreas: No mass, inflammatory changes, or other parenchymal abnormality identified. Spleen:  Within normal limits in size and appearance. Adrenals/Urinary Tract: Bilateral adrenal glands are unremarkable. 8 mm Bosniak category 2 hemorrhagic/proteinaceous right renal cyst. 5  mm Bosniak category left renal cyst. No solid enhancing renal lesions. No evidence of hydronephrosis. Stomach/Bowel: Visualized portions within the abdomen are unremarkable. Vascular/Lymphatic: Aortic atherosclerosis without aneurysmal dilation. Pathologically enlarged abdominal lymph nodes. Other:  No abdominal ascites which Musculoskeletal: No suspicious bone lesions identified. IMPRESSION: 1. Multiple common duct  choledocholithiasis measuring up to 8 mm with a 6 mm choledocholithiasis in the cystic duct as well as mild intra and extrahepatic biliary ductal dilation. 2. Cholelithiasis with gallbladder distension but without other findings acute cholecystitis. These results will be called to the ordering clinician or representative by the Radiologist Assistant, and communication documented in the PACS or [employer]. Electronically Signed   By: Dahlia Bailiff MD   On: 08/19/2020 23:17   US Abdomen Limited RUQ (LIVER/GB)  Result Date: 08/19/2020 CLINICAL DATA:  Intermittent epigastric pain EXAM: ULTRASOUND ABDOMEN LIMITED RIGHT UPPER QUADRANT COMPARISON:  CT AP from 11/23/2014 FINDINGS: Gallbladder: Gallstones measuring up to 1.3 cm are noted. No gallbladder wall thickening, pericholecystic fluid or sonographic Murphy's sign. Common bile duct: Diameter: 10.3 mm. Liver: Increased echogenicity. Contour the liver is slightly irregular. Intrahepatic bile duct dilatation identified, mild. Portal vein is patent on color Doppler imaging with normal direction of blood flow towards the liver. Other: None. IMPRESSION: 1. Gallstones.  No secondary signs of acute cholecystitis. 2. Increase caliber of the common bile duct with mild intrahepatic biliary dilatation. Cannot exclude choledocholithiasis. Recommend further evaluation with MRCP/MRI with contrast material. 3. Increased echogenicity of the liver with slightly nodular contour. This could be better assessed with MRI of the abdomen. Electronically Signed   By: Kerby Moors M.D.   On: 08/19/2020 12:18     Principal Problem:   Cholelithiasis with choledocholithiasis Active Problems:   Essential hypertension   Diabetes mellitus type 2, controlled ([place])   CAD (coronary artery disease)   Chronic combined systolic and diastolic CHF (congestive heart failure) (HCC)   CKD (chronic kidney disease), stage III ([place])     LOS: 1 day     Carmell Austria, MD 08/20/2020, 1:56  PM [facility] [phone]

## 2020-08-20 NOTE — Progress Notes (Signed)
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 Benson Norway.  I discussed risks and benefits with the patient in detail.  He is already aware of the procedure. - Hold heparin 6 hours before. - Lap chole  planned by surgery before discharge.    I have discussed with Dr. Benson Norway.   SUBJECTIVE     No abdominal pain.   MRCP shows choledocholithiasis.   No nausea, vomiting, jaundice   OBJECTIVE:     Vital signs in last 24 hours: Temp:  [97.6 F (36.4 C)-98.8 F (37.1 C)] 98.4 F (36.9 C) (04/23 1222) Pulse Rate:  [61-83] 67 (04/23 1222) Resp:  [13-18] 16 (04/23 1222) BP: (131-187)/(56-88) 169/83 (04/23 1222) SpO2:  [93 %-99 %] 97 % (04/23 0404) Last BM Date: 08/18/20 General:   Alert, well-developed male in NAD EENT:  Normal hearing, non icteric sclera, conjunctive pink.  Abdomen:  Soft, nondistended, nontender.  Normal bowel sounds,.       Neurologic:  Alert and  oriented x4;  grossly normal neurologically. Psych:  Pleasant, cooperative.  Normal mood and affect.   Intake/Output from previous day: 04/22 0701 - 04/23 0700 In: 1480 [P.O.:480; I.V.:1000] Out: 1200 [Urine:1200] Intake/Output this shift: Total I/O In: 914.6 [P.O.:240; I.V.:674.6] Out: -   Lab Results: Recent Labs    08/19/20 0930 08/20/20 0805  WBC 7.9 9.2  HGB 16.8 16.7  HCT 52.6* 53.4*  PLT 187 187   BMET Recent Labs    08/19/20 0930 08/20/20 0805  NA 138 137  K 4.8 4.9  CL 107 104  CO2 23 21*  GLUCOSE 157* 113*  BUN 26* 24*  CREATININE 1.87* 1.65*  CALCIUM 9.4 9.2   LFT Recent Labs    08/20/20 0805  PROT 6.7  ALBUMIN 3.4*  AST 110*  ALT 125*  ALKPHOS 267*  BILITOT 1.9*   PT/INR Recent Labs    08/19/20 0935  LABPROT 13.4  INR 1.0   Hepatitis Panel No results for input(s): HEPBSAG, HCVAB, HEPAIGM, HEPBIGM in the  last 72 hours.  MR ABDOMEN MRCP W WO CONTAST  Result Date: 08/19/2020 CLINICAL DATA:  Abdominal pain biliary obstruction suspected. EXAM: MRI ABDOMEN WITHOUT AND WITH CONTRAST (INCLUDING MRCP) TECHNIQUE: Multiplanar multisequence MR imaging of the abdomen was performed both before and after the administration of intravenous contrast. Heavily T2-weighted images of the biliary and pancreatic ducts were obtained, and three-dimensional MRCP images were rendered by post processing. CONTRAST:  8.29mL GADAVIST GADOBUTROL 1 MMOL/ML IV SOLN COMPARISON:  Day abdominal ultrasound. CT abdomen pelvis November 23, 2014. FINDINGS: Lower chest: No acute abnormality. Median sternotomy with postsurgical change in the anterior abdominal wall. Hepatobiliary: No hepatic steatosis. No suspicious hepatic lesions. Cholelithiasis gallbladder distension but without wall thickening or pericholecystic fluid. There is mild intrahepatic biliary ductal dilation. The common duct is dilated measuring 10 mm with mild likely reactive peribiliary enhancement. There are multiple filling defects in the common bile duct measuring up to 8 mm on image 12/18. The cystic duct is dilated with a 6 mm stone in the duct at the confluence with the common duct on image 10/18. Pancreas: No mass, inflammatory changes, or other parenchymal abnormality identified. Spleen:  Within normal limits in size and appearance. Adrenals/Urinary Tract: Bilateral adrenal glands are unremarkable. 8 mm Bosniak category 2 hemorrhagic/proteinaceous right renal cyst. 5  mm Bosniak category left renal cyst. No solid enhancing renal lesions. No evidence of hydronephrosis. Stomach/Bowel: Visualized portions within the abdomen are unremarkable. Vascular/Lymphatic: Aortic atherosclerosis without aneurysmal dilation. Pathologically enlarged abdominal lymph nodes. Other:  No abdominal ascites which Musculoskeletal: No suspicious bone lesions identified. IMPRESSION: 1. Multiple common duct  choledocholithiasis measuring up to 8 mm with a 6 mm choledocholithiasis in the cystic duct as well as mild intra and extrahepatic biliary ductal dilation. 2. Cholelithiasis with gallbladder distension but without other findings acute cholecystitis. These results will be called to the ordering clinician or representative by the Radiologist Assistant, and communication documented in the PACS or [employer]. Electronically Signed   By: Dahlia Bailiff MD   On: 08/19/2020 23:17   US Abdomen Limited RUQ (LIVER/GB)  Result Date: 08/19/2020 CLINICAL DATA:  Intermittent epigastric pain EXAM: ULTRASOUND ABDOMEN LIMITED RIGHT UPPER QUADRANT COMPARISON:  CT AP from 11/23/2014 FINDINGS: Gallbladder: Gallstones measuring up to 1.3 cm are noted. No gallbladder wall thickening, pericholecystic fluid or sonographic Murphy's sign. Common bile duct: Diameter: 10.3 mm. Liver: Increased echogenicity. Contour the liver is slightly irregular. Intrahepatic bile duct dilatation identified, mild. Portal vein is patent on color Doppler imaging with normal direction of blood flow towards the liver. Other: None. IMPRESSION: 1. Gallstones.  No secondary signs of acute cholecystitis. 2. Increase caliber of the common bile duct with mild intrahepatic biliary dilatation. Cannot exclude choledocholithiasis. Recommend further evaluation with MRCP/MRI with contrast material. 3. Increased echogenicity of the liver with slightly nodular contour. This could be better assessed with MRI of the abdomen. Electronically Signed   By: Kerby Moors M.D.   On: 08/19/2020 12:18     Principal Problem:   Cholelithiasis with choledocholithiasis Active Problems:   Essential hypertension   Diabetes mellitus type 2, controlled ([place])   CAD (coronary artery disease)   Chronic combined systolic and diastolic CHF (congestive heart failure) (HCC)   CKD (chronic kidney disease), stage III ([place])     LOS: 1 day     Carmell Austria, MD 08/20/2020, 1:56  PM [facility] [phone]

## 2020-08-20 NOTE — Progress Notes (Addendum)
[place] for heparin Indication: atrial fibrillation  Allergies  Allergen Reactions  . Spironolactone Other (See Comments)    Painful gynecomastia  . Niacin And Related     Patient Measurements: Height: 6\' 2"  (188 cm) Weight: 94.3 kg (208 lb) IBW/kg (Calculated) : 82.2 Heparin Dosing Weight: TBW  Vital Signs: Temp: 99.2 F (37.3 C) (04/23 1827) Temp Source: Oral (04/23 1827) BP: 141/72 (04/23 1827) Pulse Rate: 64 (04/23 1827)  Labs: Recent Labs    08/19/20 0930 08/19/20 0935 08/20/20 0805 08/20/20 1815  HGB 16.8  --  16.7  --   HCT 52.6*  --  53.4*  --   PLT 187  --  187  --   APTT  --   --  128*  --   LABPROT  --  13.4  --   --   INR  --  1.0  --   --   HEPARINUNFRC  --   --  >1.10* 0.95*  CREATININE 1.87*  --  1.65*  --     Estimated Creatinine Clearance: 45 mL/min (A) (by C-G formula based on SCr of 1.65 mg/dL (H)).   Medical History: Past Medical History:  Diagnosis Date  . AICD (automatic cardioverter/defibrillator) present   . Arthritis   . Bladder cancer ([place])   . Borderline glaucoma   . Chronic systolic (congestive) heart failure ([place])   . Coronary artery disease   . Diabetes mellitus type 2, controlled ([place]) ORAL MED  . Essential hypertension   . Heart murmur   . Hepatitis   . Medical history non-contributory   . Paroxysmal A-fib ([place])   . Paroxysmal A-fib ([place]) 08/20/2020  . Peripheral vascular disease ([place])   . Pneumonia    Assessment: 78 YOM presenting with abd pain, possible choledocholithiasis, hx of afib on Eliquis, holding for possible GI procedure.  Last dose of Eliquis 4/21 @1800 . 8-hour HL and aPTT not obtained overnight.   Heparin level came back elevated (as expected with recent apixaban), aPTT came back supratherapeutic at 111, on 1050 units/hr. No s/sx of bleeding or infusion issues.   Goal of Therapy:  Heparin level 0.3-0.7 units/ml aPTT 66-102 seconds Monitor platelets by  anticoagulation protocol: Yes   Plan:  Decreased heparin gtt to 850 units/hr Plan for heparin to stop on 4/24@0200  F/u intervention plans and ability to transition back to [place], PharmD, [place] Pharmacist  Phone: [phone] 08/20/2020 7:36 PM  Please check AMION for all [facility] phone numbers After 10:00 PM, call [facility] [phone]

## 2020-08-20 NOTE — Progress Notes (Signed)
Subjective/Chief Complaint: Denies abdominal pain or nausea.    Objective: Vital signs in last 24 hours: Temp:  [97.4 F (36.3 C)-98.8 F (37.1 C)] 98.8 F (37.1 C) (04/23 0404) Pulse Rate:  [55-83] 70 (04/23 0404) Resp:  [11-18] 17 (04/23 0404) BP: (125-187)/(56-137) 142/66 (04/23 0404) SpO2:  [93 %-100 %] 97 % (04/23 0404) Weight:  [94.3 kg] 94.3 kg (04/22 0923) Last BM Date: 08/18/20  Intake/Output from previous day: 04/22 0701 - 04/23 0700 In: 1480 [P.O.:480; I.V.:1000] Out: 1200 [Urine:1200] Intake/Output this shift: No intake/output data recorded.  General appearance: alert and cooperative Resp: unlabored Cardio: regular rate and rhythm and heparin drip running GI: soft, non-tender; bowel sounds normal; no masses,  no organomegaly Skin: Skin color, texture, turgor normal. No rashes or lesions Neurologic: Grossly normal  Lab Results:  Recent Labs    08/19/20 0930  WBC 7.9  HGB 16.8  HCT 52.6*  PLT 187   BMET Recent Labs    08/19/20 0930  NA 138  K 4.8  CL 107  CO2 23  GLUCOSE 157*  BUN 26*  CREATININE 1.87*  CALCIUM 9.4   PT/INR Recent Labs    08/19/20 0935  LABPROT 13.4  INR 1.0   ABG No results for input(s): PHART, HCO3 in the last 72 hours.  Invalid input(s): PCO2, PO2  Studies/Results: MR ABDOMEN MRCP W WO CONTAST  Result Date: 08/19/2020 CLINICAL DATA:  Abdominal pain biliary obstruction suspected. EXAM: MRI ABDOMEN WITHOUT AND WITH CONTRAST (INCLUDING MRCP) TECHNIQUE: Multiplanar multisequence MR imaging of the abdomen was performed both before and after the administration of intravenous contrast. Heavily T2-weighted images of the biliary and pancreatic ducts were obtained, and three-dimensional MRCP images were rendered by post processing. CONTRAST:  8.80mL GADAVIST GADOBUTROL 1 MMOL/ML IV SOLN COMPARISON:  Day abdominal ultrasound. CT abdomen pelvis November 23, 2014. FINDINGS: Lower chest: No acute abnormality. Median sternotomy with  postsurgical change in the anterior abdominal wall. Hepatobiliary: No hepatic steatosis. No suspicious hepatic lesions. Cholelithiasis gallbladder distension but without wall thickening or pericholecystic fluid. There is mild intrahepatic biliary ductal dilation. The common duct is dilated measuring 10 mm with mild likely reactive peribiliary enhancement. There are multiple filling defects in the common bile duct measuring up to 8 mm on image 12/18. The cystic duct is dilated with a 6 mm stone in the duct at the confluence with the common duct on image 10/18. Pancreas: No mass, inflammatory changes, or other parenchymal abnormality identified. Spleen:  Within normal limits in size and appearance. Adrenals/Urinary Tract: Bilateral adrenal glands are unremarkable. 8 mm Bosniak category 2 hemorrhagic/proteinaceous right renal cyst. 5 mm Bosniak category left renal cyst. No solid enhancing renal lesions. No evidence of hydronephrosis. Stomach/Bowel: Visualized portions within the abdomen are unremarkable. Vascular/Lymphatic: Aortic atherosclerosis without aneurysmal dilation. Pathologically enlarged abdominal lymph nodes. Other:  No abdominal ascites which Musculoskeletal: No suspicious bone lesions identified. IMPRESSION: 1. Multiple common duct choledocholithiasis measuring up to 8 mm with a 6 mm choledocholithiasis in the cystic duct as well as mild intra and extrahepatic biliary ductal dilation. 2. Cholelithiasis with gallbladder distension but without other findings acute cholecystitis. These results will be called to the ordering clinician or representative by the Radiologist Assistant, and communication documented in the PACS or [employer]. Electronically Signed   By: Dahlia Bailiff MD   On: 08/19/2020 23:17   US Abdomen Limited RUQ (LIVER/GB)  Result Date: 08/19/2020 CLINICAL DATA:  Intermittent epigastric pain EXAM: ULTRASOUND ABDOMEN LIMITED RIGHT UPPER QUADRANT COMPARISON:  CT AP from 11/23/2014  FINDINGS: Gallbladder: Gallstones measuring up to 1.3 cm are noted. No gallbladder wall thickening, pericholecystic fluid or sonographic Murphy's sign. Common bile duct: Diameter: 10.3 mm. Liver: Increased echogenicity. Contour the liver is slightly irregular. Intrahepatic bile duct dilatation identified, mild. Portal vein is patent on color Doppler imaging with normal direction of blood flow towards the liver. Other: None. IMPRESSION: 1. Gallstones.  No secondary signs of acute cholecystitis. 2. Increase caliber of the common bile duct with mild intrahepatic biliary dilatation. Cannot exclude choledocholithiasis. Recommend further evaluation with MRCP/MRI with contrast material. 3. Increased echogenicity of the liver with slightly nodular contour. This could be better assessed with MRI of the abdomen. Electronically Signed   By: Kerby Moors M.D.   On: 08/19/2020 12:18    Anti-infectives: Anti-infectives (From admission, onward)   None      Assessment/Plan:  CHF (EF 25-30% on Echo 12/10/18) w/ AICF in place CAD s/p CABG on Eliquis (last dose 4/21 am) HTN PVD  DM2   Cholelithiasis Possible Choledocholithiasis Elevated LFT's MRCP positive for choledocholithiasis. Will need ERCP. Plan lap chole this admission after CBD cleared.  FEN - NPO, IVF VTE - SCDs, heparin gtt ID - None indicated from a general surgery standpoint   LOS: 1 day    [name] [name] 08/20/2020

## 2020-08-21 ENCOUNTER — Inpatient Hospital Stay (HOSPITAL_COMMUNITY): Payer: Medicare Other

## 2020-08-21 ENCOUNTER — Encounter (HOSPITAL_COMMUNITY)
Admission: EM | Disposition: A | Discharge status: Home / Self Care | Payer: Self-pay | Source: Home / Self Care | Attending: Internal Medicine

## 2020-08-21 ENCOUNTER — Encounter (HOSPITAL_COMMUNITY): Payer: Self-pay | Admitting: Internal Medicine

## 2020-08-21 ENCOUNTER — Inpatient Hospital Stay (HOSPITAL_COMMUNITY): Payer: Medicare Other | Admitting: Anesthesiology

## 2020-08-21 HISTORY — PX: ERCP: SHX5425

## 2020-08-21 HISTORY — PX: SPHINCTEROTOMY: SHX5544

## 2020-08-21 HISTORY — PX: REMOVAL OF STONES: SHX5545

## 2020-08-21 LAB — GLUCOSE, CAPILLARY
Glucose-Capillary: 130 mg/dL — ABNORMAL HIGH (ref 70–99)
Glucose-Capillary: 145 mg/dL — ABNORMAL HIGH (ref 70–99)
Glucose-Capillary: 263 mg/dL — ABNORMAL HIGH (ref 70–99)
Glucose-Capillary: 148 mg/dL — ABNORMAL HIGH (ref 70–99)
Glucose-Capillary: 194 mg/dL — ABNORMAL HIGH (ref 70–99)

## 2020-08-21 LAB — COMPREHENSIVE METABOLIC PANEL
ALT: 86 U/L — ABNORMAL HIGH (ref 0–44)
AST: 62 U/L — ABNORMAL HIGH (ref 15–41)
Albumin: 3 g/dL — ABNORMAL LOW (ref 3.5–5.0)
Alkaline Phosphatase: 225 U/L — ABNORMAL HIGH (ref 38–126)
Anion gap: 8 (ref 5–15)
BUN: 23 mg/dL (ref 8–23)
CO2: 23 mmol/L (ref 22–32)
Calcium: 8.9 mg/dL (ref 8.9–10.3)
Chloride: 105 mmol/L (ref 98–111)
Creatinine, Ser: 1.66 mg/dL — ABNORMAL HIGH (ref 0.61–1.24)
GFR, Estimated: 43 mL/min — ABNORMAL LOW (ref >60)
Glucose, Bld: 122 mg/dL — ABNORMAL HIGH (ref 70–99)
Potassium: 4.4 mmol/L (ref 3.5–5.1)
Sodium: 136 mmol/L (ref 135–145)
Total Bilirubin: 1.5 mg/dL — ABNORMAL HIGH (ref 0.3–1.2)
Total Protein: 6.1 g/dL — ABNORMAL LOW (ref 6.5–8.1)

## 2020-08-21 LAB — CBC
HCT: 47.2 % (ref 39.0–52.0)
Hemoglobin: 14.9 g/dL (ref 13.0–17.0)
MCH: 28.2 pg (ref 26.0–34.0)
MCHC: 31.6 g/dL (ref 30.0–36.0)
MCV: 89.2 fL (ref 80.0–100.0)
Platelets: 190 10*3/uL (ref 150–400)
RBC: 5.29 MIL/uL (ref 4.22–5.81)
RDW: 15.1 % (ref 11.5–15.5)
WBC: 8 10*3/uL (ref 4.0–10.5)
nRBC: 0 % (ref 0.0–0.2)

## 2020-08-21 SURGERY — ERCP, WITH INTERVENTION IF INDICATED
Anesthesia: General

## 2020-08-21 MED ORDER — FENTANYL CITRATE (PF) 100 MCG/2ML IJ SOLN: 25.0000 ug | INTRAMUSCULAR | Status: DC | PRN

## 2020-08-21 MED ORDER — LIDOCAINE 2% (20 MG/ML) 5 ML SYRINGE
INTRAMUSCULAR | Status: DC | PRN
  Administered 2020-08-21: 80 mg via INTRAVENOUS

## 2020-08-21 MED ORDER — FENTANYL CITRATE (PF) 100 MCG/2ML IJ SOLN
INTRAMUSCULAR | Status: DC | PRN
  Administered 2020-08-21 (×2): 50 ug via INTRAVENOUS

## 2020-08-21 MED ORDER — ONDANSETRON HCL 4 MG/2ML IJ SOLN
INTRAMUSCULAR | Status: DC | PRN
  Administered 2020-08-21: 4 mg via INTRAVENOUS

## 2020-08-21 MED ORDER — ROCURONIUM BROMIDE 10 MG/ML (PF) SYRINGE
PREFILLED_SYRINGE | INTRAVENOUS | Status: DC | PRN
  Administered 2020-08-21: 60 mg via INTRAVENOUS

## 2020-08-21 MED ORDER — INDOMETHACIN 50 MG RE SUPP
100.0000 mg | Freq: Once | RECTAL | Status: DC
  Filled 2020-08-21: qty 2

## 2020-08-21 MED ORDER — INDOMETHACIN 50 MG RE SUPP
RECTAL | Status: DC | PRN
  Administered 2020-08-21: 100 mg via RECTAL

## 2020-08-21 MED ORDER — LIDOCAINE 2% (20 MG/ML) 5 ML SYRINGE: INTRAMUSCULAR | Status: DC | PRN

## 2020-08-21 MED ORDER — CIPROFLOXACIN IN D5W 400 MG/200ML IV SOLN
INTRAVENOUS | Status: DC | PRN
  Administered 2020-08-21: 400 mg via INTRAVENOUS

## 2020-08-21 MED ORDER — EPHEDRINE SULFATE 50 MG/ML IJ SOLN
INTRAMUSCULAR | Status: DC | PRN
  Administered 2020-08-21: 10 mg via INTRAVENOUS

## 2020-08-21 MED ORDER — INDOMETHACIN 50 MG RE SUPP
RECTAL | Status: AC
  Filled 2020-08-21: qty 2

## 2020-08-21 MED ORDER — ETOMIDATE 2 MG/ML IV SOLN
INTRAVENOUS | Status: DC | PRN
  Administered 2020-08-21: 20 mg via INTRAVENOUS

## 2020-08-21 MED ORDER — GLUCAGON HCL RDNA (DIAGNOSTIC) 1 MG IJ SOLR
INTRAMUSCULAR | Status: AC
  Filled 2020-08-21: qty 1

## 2020-08-21 MED ORDER — SODIUM CHLORIDE 0.9 % IV SOLN
INTRAVENOUS | Status: DC | PRN
  Administered 2020-08-21: 15 mL

## 2020-08-21 MED ORDER — SUGAMMADEX SODIUM 200 MG/2ML IV SOLN
INTRAVENOUS | Status: DC | PRN
  Administered 2020-08-21: 200 mg via INTRAVENOUS

## 2020-08-21 MED ORDER — CIPROFLOXACIN IN D5W 400 MG/200ML IV SOLN
INTRAVENOUS | Status: AC
  Filled 2020-08-21: qty 200

## 2020-08-21 MED ORDER — ONDANSETRON HCL 4 MG/2ML IJ SOLN: 4.0000 mg | Freq: Once | INTRAMUSCULAR | Status: DC | PRN

## 2020-08-21 MED ORDER — SODIUM CHLORIDE 0.9 % IV SOLN
1.5000 g | INTRAVENOUS | Status: DC
  Filled 2020-08-21: qty 4

## 2020-08-21 NOTE — Transfer of Care (Signed)
Immediate Anesthesia Transfer of Care Note  Patient: [name] [name]  Procedure(s) Performed: ENDOSCOPIC RETROGRADE CHOLANGIOPANCREATOGRAPHY (ERCP) (N/A ) SPHINCTEROTOMY REMOVAL OF STONES  Patient Location: PACU  Anesthesia Type:General  Level of Consciousness: awake, alert  and oriented  Airway & Oxygen Therapy: Patient Spontanous Breathing  Post-op Assessment: Report given to RN and Post -op Vital signs reviewed and stable  Post vital signs: Reviewed and stable  Last Vitals:  Vitals Value Taken Time  BP    Temp    Pulse 75 08/21/20 0849  Resp 5 08/21/20 0849  SpO2 98 % 08/21/20 0849  Vitals shown include unvalidated device data.  Last Pain:  Vitals:   08/21/20 0728  TempSrc: Oral  PainSc: 0-No pain         Complications: No complications documented.

## 2020-08-21 NOTE — Op Note (Addendum)
[facility] Patient Name: [name] [name] Procedure Date : 08/21/2020 MRN: [id number] Attending MD: Carol Ada , MD Date of Birth: 1946/04/14 CSN: [id number] Age: 75 Admit Type: Inpatient Procedure:                ERCP Indications:              Common bile duct stone(s) Providers:                Carol Ada, MD, [name], RN, Laverda Sorenson,                            Technician Referring MD:              Medicines:                Propofol per Anesthesia, Indomethacin 100 mg PR. Complications:            No immediate complications. Estimated Blood Loss:     Estimated blood loss: none. Procedure:                Pre-Anesthesia Assessment:                           - Prior to the procedure, a History and Physical                            was performed, and patient medications and                            allergies were reviewed. The patient's tolerance of                            previous anesthesia was also reviewed. The risks                            and benefits of the procedure and the sedation                            options and risks were discussed with the patient.                            All questions were answered, and informed consent                            was obtained. Prior Anticoagulants: The patient has                            taken heparin, last dose was day of procedure. ASA                            Grade Assessment: III - A patient with severe                            systemic disease. After reviewing the risks and  benefits, the patient was deemed in satisfactory                            condition to undergo the procedure.                           - Sedation was administered by an anesthesia                            professional. Deep sedation was attained.                           After obtaining informed consent, the scope was                            passed under direct vision. Throughout  the                            procedure, the patient's blood pressure, pulse, and                            oxygen saturations were monitored continuously. The                            TJF- Q180V ([id number]) Olympus duodenoscope was                            introduced through the mouth, and used to inject                            contrast into and used to inject contrast into the                            bile duct. The ERCP was somewhat difficult. The                            patient tolerated the procedure well. Scope In: Scope Out: Findings:      The major papilla was normal. A short 0.035 inch Soft Jagwire was passed       into the biliary tree. A 12 mm biliary sphincterotomy was made with a       traction (standard) sphincterotome using ERBE electrocautery. There was       no post-sphincterotomy bleeding. The biliary tree was swept with a 12 mm       balloon starting at the bifurcation. Three stones were removed. No       stones remained.      Initial cannulation was difficult, but there was no cannulation of the       PD. After several more attempts in different positions the guidewire was       easily advanced and secured in the right intrahepatic ducts. Contrast       injection revealed a dilated CBD and dilated intrahepatics. The CBD       measured 11-13 mm in diameter. Three filling defects were identified.       Two stones were in the distal  CBD and a larger stone was in the proximal       CBD. A 12 mm sphincterotomy was created and there was brisk drainage of       the contrast. The two lower, and smaller, stones were extracted with       ease one at a time. The proximal stone was removed with slightly more       difficulty as a result of the larger size. Two additional sweeps of the       CBD were negative for any retained stones. A final occlusion       cholangiogram was performed and there was no evidence of any retained       stones. There was some contrast noted  to be filling the cystic duct. Impression:               - The major papilla appeared normal.                           - Choledocholithiasis was found. Complete removal                            was accomplished by biliary sphincterotomy and                            balloon extraction.                           - A biliary sphincterotomy was performed.                           - The biliary tree was swept. Recommendation:           - Return patient to hospital ward for ongoing care.                           - Continue to hold heparin and any anticoagulation                            for another 48 hours, because of the larger                            sphincterotomy. If anticoagulation is necessary,                            then the heparin needs to be minimized.                           - Lap chole per Surgery. Procedure Code(s):        --- Professional ---                           [phone], Endoscopic retrograde                            cholangiopancreatography (ERCP); with removal of  calculi/debris from biliary/pancreatic duct(s)                           [phone], Endoscopic retrograde                            cholangiopancreatography (ERCP); with                            sphincterotomy/papillotomy Diagnosis Code(s):        --- Professional ---                           K80.50, Calculus of bile duct without cholangitis                            or cholecystitis without obstruction CPT copyright 2019 American Medical Association. All rights reserved. The codes documented in this report are preliminary and upon coder review may  be revised to meet current compliance requirements. Carol Ada, MD Carol Ada, MD 08/21/2020 8:52:29 AM This report has been signed electronically. Number of Addenda: 0

## 2020-08-21 NOTE — Anesthesia Preprocedure Evaluation (Addendum)
Anesthesia Evaluation  Patient identified by MRN, date of birth, ID band Patient awake    Reviewed: Allergy & Precautions, NPO status , Patient's Chart, lab work & pertinent test results, reviewed documented beta blocker date and time   Airway Mallampati: I  TM Distance: >3 FB Neck ROM: Full    Dental  (+) Teeth Intact, Dental Advisory Given   Pulmonary former smoker,    Pulmonary exam normal breath sounds clear to auscultation       Cardiovascular hypertension, Pt. on home beta blockers + CAD, + Past MI, + CABG, + Peripheral Vascular Disease and +CHF  Normal cardiovascular exam+ dysrhythmias Atrial Fibrillation + Cardiac Defibrillator + Valvular Problems/Murmurs MR  Rhythm:Regular Rate:Normal  Last echo  August 2020 showed LV function of 25 to 30%.  Small apical thrombus.   Neuro/Psych negative neurological ROS     GI/Hepatic GERD  Medicated,Choledocholithiasis   Endo/Other  diabetes, Type 2, Oral Hypoglycemic Agents  Renal/GU Renal InsufficiencyRenal disease   Bladder cancer    Musculoskeletal  (+) Arthritis ,   Abdominal   Peds  Hematology  (+) Blood dyscrasia (Eliquis), ,   Anesthesia Other Findings   Reproductive/Obstetrics                            Anesthesia Physical Anesthesia Plan  ASA: IV  Anesthesia Plan: General   Post-op Pain Management:    Induction: Intravenous  PONV Risk Score and Plan: 2 and Dexamethasone and Ondansetron  Airway Management Planned: Oral ETT  Additional Equipment:   Intra-op Plan:   Post-operative Plan: Extubation in OR  Informed Consent: I have reviewed the patients History and Physical, chart, labs and discussed the procedure including the risks, benefits and alternatives for the proposed anesthesia with the patient or authorized representative who has indicated his/her understanding and acceptance.     Dental advisory given  Plan  Discussed with: CRNA  Anesthesia Plan Comments: (Etomidate )       Anesthesia Quick Evaluation

## 2020-08-21 NOTE — Anesthesia Postprocedure Evaluation (Signed)
Anesthesia Post Note  Patient: RIYAAN HEROUX  Procedure(s) Performed: ENDOSCOPIC RETROGRADE CHOLANGIOPANCREATOGRAPHY (ERCP) (N/A ) SPHINCTEROTOMY REMOVAL OF STONES     Patient location during evaluation: PACU Anesthesia Type: General Level of consciousness: awake and alert Pain management: pain level controlled Vital Signs Assessment: post-procedure vital signs reviewed and stable Respiratory status: spontaneous breathing, nonlabored ventilation, respiratory function stable and patient connected to nasal cannula oxygen Cardiovascular status: blood pressure returned to baseline and stable Postop Assessment: no apparent nausea or vomiting Anesthetic complications: no   No complications documented.  Last Vitals:  Vitals:   08/21/20 1009 08/21/20 1210  BP: (!) 160/76 128/76  Pulse: 62 64  Resp: 17 18  Temp: 36.4 C 36.9 C  SpO2: 97% 98%    Last Pain:  Vitals:   08/21/20 1210  TempSrc: Oral  PainSc:                  Catalina Gravel

## 2020-08-21 NOTE — Interval H&P Note (Signed)
History and Physical Interval Note:  08/21/2020 7:26 AM  [name] [name]  has presented today for surgery, with the diagnosis of choledocholithiasis.  The various methods of treatment have been discussed with the patient and family. After consideration of risks, benefits and other options for treatment, the patient has consented to  Procedure(s): ENDOSCOPIC RETROGRADE CHOLANGIOPANCREATOGRAPHY (ERCP) (N/A) as a surgical intervention.  The patient's history has been reviewed, patient examined, no change in status, stable for surgery.  I have reviewed the patient's chart and labs.  Questions were answered to the patient's satisfaction.     Fernande Treiber D

## 2020-08-21 NOTE — Progress Notes (Signed)
Pt returned from endo in wheelchair by endo staff. Pt GCS 15. Safety precautions reviewed with pt, bed alarm activated, call button within pt reach.

## 2020-08-21 NOTE — Progress Notes (Signed)
Pt transported in wheelchair to endo by endo staff. Pt GCS 15

## 2020-08-21 NOTE — Anesthesia Procedure Notes (Signed)
Procedure Name: Intubation Date/Time: 08/21/2020 7:49 AM Performed by: Babs Bertin, CRNA Pre-anesthesia Checklist: Patient identified, Emergency Drugs available, Suction available and Patient being monitored Patient Re-evaluated:Patient Re-evaluated prior to induction Oxygen Delivery Method: Circle System Utilized Preoxygenation: Pre-oxygenation with 100% oxygen Induction Type: IV induction Ventilation: Mask ventilation without difficulty Laryngoscope Size: Mac and 4 Grade View: Grade I Tube type: Oral Tube size: 7.5 mm Number of attempts: 1 Airway Equipment and Method: Stylet and Oral airway Placement Confirmation: ETT inserted through vocal cords under direct vision,  positive ETCO2 and breath sounds checked- equal and bilateral Secured at: 22 cm Tube secured with: Tape Dental Injury: Teeth and Oropharynx as per pre-operative assessment

## 2020-08-21 NOTE — Progress Notes (Signed)
Progress Note    [name] [name]  FYB:[id number] DOB: 1945/09/14  DOA: 08/19/2020 PCP: Christain Sacramento, MD    Brief Narrative:   Medical records reviewed and are as summarized below:  [name] [name] is an 75 y.o. male with medical history significant of PVD; HTN; DM; CAD; bladder cancer; and chronic systolic CHF with AICD presenting with epigastric pain.  Sunday night, he developed midepigastric pain - this has happened before, like a strong burning sensation.  Lasted about 12 hours.  At 2 points in that time, he was shivering and felt febrile.  This was similar to prior episodes.  It resolved about 10AM the following day.  He has continued to have bloating and belching.  He saw his PCP on 4/20 and had blood work and they called today and told him to come to the ER.    Assessment/Plan:   Principal Problem:   Cholelithiasis with choledocholithiasis Active Problems:   Essential hypertension   Diabetes mellitus type 2, controlled (HCC)   CAD (coronary artery disease)   Chronic combined systolic and diastolic CHF (congestive heart failure) (HCC)   CKD (chronic kidney disease), stage III ([place])   Choledocholithiasis with recent biliary obstruction -Patient with acute onset of epigastric pain on Sunday; completely resolved at this time -He was seen by his PCP on 4/20 and blood work was clearly concerning for biliary obstruction; results returned today and patient was sent to ED -Despite being pain-free, he had recent bile duct obstruction and has persistent ductal dilatation with gallstones -s/p MRCP: Multiple common duct choledocholithiasis measuring up to 8 mm with a 6 mm choledocholithiasis in the cystic duct as well as mild intra and extrahepatic biliary ductal dilation. 2. Cholelithiasis with gallbladder distension but without other findings acute cholecystitis. -s/p ERCP -general surgery following- plan for cholecystectomy in AM  AKI with stage 3a CKD -Baseline creatinine  1.4 -Creatinine up to 2.23 on 4/20  -Will continue to hydrate and follow  Chronic systolic CHF with AICD -Does not have pacer; AICD has never fired -Heparin in lieu of Eliquis -Continue Entresto, Coreg -Cards consult: From cardiac standpoint he is at moderate risk for CV complications from surgery based on his known CAD and reduced EF. However, he has been quite functional without significant cardiac limitation. Thus I think it would be safe for him to proceed to surgery with further cardiac testing or intervention. OK to hold Eliquis for up to 48 hours prior to surgery without heparin bridge.   HTN -Continue Entresto, Coreg  DM -A1c is 8, indicating poor control -hold Glucophage, Jardiance -Cover with moderate-scale SSI    Family Communication/Anticipated D/C date and plan/Code Status   DVT prophylaxis: start SQ heparin if still here after 48 hours Code Status: Full Code.  Family at bedside Disposition Plan: Status is: Inpatient  Remains inpatient appropriate because:Inpatient level of care appropriate due to severity of illness   Dispo: The patient is from: Home              Anticipated d/c is to: Home              Patient currently is not medically stable to d/c.   Difficult to place patient No         Medical Consultants:    GI  GS  Subjective:   Asking for regular food  Objective:    Vitals:   08/21/20 0915 08/21/20 0954 08/21/20 1009 08/21/20 1210  BP:  (!) 173/78 [name])  160/76 128/76  Pulse: (!) 59 65 62 64  Resp: 11 16 17 18   Temp: (!) 97.4 F (36.3 C) (!) 97.5 F (36.4 C) 97.6 F (36.4 C) 98.4 F (36.9 C)  TempSrc:  Oral Oral Oral  SpO2: 98% 98% 97% 98%  Weight:      Height:        Intake/Output Summary (Last 24 hours) at 08/21/2020 1351 Last data filed at 08/21/2020 0846 Gross per 24 hour  Intake 1575.88 ml  Output 400 ml  Net 1175.88 ml   Filed Weights   08/19/20 0923 08/21/20 0728  Weight: 94.3 kg 94.3 kg     Exam:  General: Appearance:     Overweight male in no acute distress     Lungs:     Clear to auscultation bilaterally, respirations unlabored  Heart:    Normal heart rate. Normal rhythm. No murmurs, rubs, or gallops.   MS:   All extremities are intact.   Neurologic:   Awake, alert, oriented x 3. No apparent focal neurological           defect.                        Data Reviewed:   I have personally reviewed following labs and imaging studies:  Labs: Labs show the following:   Basic Metabolic Panel: Recent Labs  Lab 08/19/20 0930 08/20/20 0805 08/21/20 0348  NA 138 137 136  K 4.8 4.9 4.4  CL 107 104 105  CO2 23 21* 23  GLUCOSE 157* 113* 122*  BUN 26* 24* 23  CREATININE 1.87* 1.65* 1.66*  CALCIUM 9.4 9.2 8.9   GFR Estimated Creatinine Clearance: 44.7 mL/min (A) (by C-G formula based on SCr of 1.66 mg/dL (H)). Liver Function Tests: Recent Labs  Lab 08/19/20 0930 08/20/20 0805 08/21/20 0348  AST 57* 110* 62*  ALT 94* 125* 86*  ALKPHOS 310* 267* 225*  BILITOT 1.4* 1.9* 1.5*  PROT 6.8 6.7 6.1*  ALBUMIN 3.4* 3.4* 3.0*   Recent Labs  Lab 08/19/20 0930  LIPASE 54*   No results for input(s): AMMONIA in the last 168 hours. Coagulation profile Recent Labs  Lab 08/19/20 0935  INR 1.0    CBC: Recent Labs  Lab 08/19/20 0930 08/20/20 0805 08/21/20 0348  WBC 7.9 9.2 8.0  HGB 16.8 16.7 14.9  HCT 52.6* 53.4* 47.2  MCV 91.3 91.8 89.2  PLT 187 187 190   Cardiac Enzymes: No results for input(s): CKTOTAL, CKMB, CKMBINDEX, TROPONINI in the last 168 hours. BNP (last 3 results) No results for input(s): PROBNP in the last 8760 hours. CBG: Recent Labs  Lab 08/20/20 1619 08/20/20 2205 08/21/20 0623 08/21/20 0905 08/21/20 1135  GLUCAP 229* 124* 130* 145* 263*   D-Dimer: No results for input(s): DDIMER in the last 72 hours. Hgb A1c: Recent Labs    08/19/20 0930  HGBA1C 8.0*   Lipid Profile: No results for input(s): CHOL, HDL, LDLCALC,  TRIG, CHOLHDL, LDLDIRECT in the last 72 hours. Thyroid function studies: No results for input(s): TSH, T4TOTAL, T3FREE, THYROIDAB in the last 72 hours.  Invalid input(s): FREET3 Anemia work up: No results for input(s): VITAMINB12, FOLATE, FERRITIN, TIBC, IRON, RETICCTPCT in the last 72 hours. Sepsis Labs: Recent Labs  Lab 08/19/20 0930 08/20/20 0805 08/21/20 0348  WBC 7.9 9.2 8.0    Microbiology Recent Results (from the past 240 hour(s))  Resp Panel by RT-PCR (Flu A&B, Covid) Nasopharyngeal Swab  Status: None   Collection Time: 08/19/20  9:37 AM   Specimen: Nasopharyngeal Swab; Nasopharyngeal(NP) swabs in vial transport medium  Result Value Ref Range Status   SARS Coronavirus 2 by RT PCR NEGATIVE NEGATIVE Final    Comment: (NOTE) SARS-CoV-2 target nucleic acids are NOT DETECTED.  The SARS-CoV-2 RNA is generally detectable in upper respiratory specimens during the acute phase of infection. The lowest concentration of SARS-CoV-2 viral copies this assay can detect is 138 copies/mL. A negative result does not preclude SARS-Cov-2 infection and should not be used as the sole basis for treatment or other patient management decisions. A negative result may occur with  improper specimen collection/handling, submission of specimen other than nasopharyngeal swab, presence of viral mutation(s) within the areas targeted by this assay, and inadequate number of viral copies(<138 copies/mL). A negative result must be combined with clinical observations, patient history, and epidemiological information. The expected result is Negative.  Fact Sheet for Patients:  EntrepreneurPulse.com.au  Fact Sheet for Healthcare Providers:  IncredibleEmployment.be  This test is no t yet approved or cleared by the [place] FDA and  has been authorized for detection and/or diagnosis of SARS-CoV-2 by FDA under an Emergency Use Authorization (EUA). This EUA will  remain  in effect (meaning this test can be used) for the duration of the COVID-19 declaration under Section 564(b)(1) of the Act, 21 U.S.C.section 360bbb-3(b)(1), unless the authorization is terminated  or revoked sooner.       Influenza A by PCR NEGATIVE NEGATIVE Final   Influenza B by PCR NEGATIVE NEGATIVE Final    Comment: (NOTE) The Xpert Xpress SARS-CoV-2/FLU/RSV plus assay is intended as an aid in the diagnosis of influenza from Nasopharyngeal swab specimens and should not be used as a sole basis for treatment. Nasal washings and aspirates are unacceptable for Xpert Xpress SARS-CoV-2/FLU/RSV testing.  Fact Sheet for Patients: EntrepreneurPulse.com.au  Fact Sheet for Healthcare Providers: IncredibleEmployment.be  This test is not yet approved or cleared by the [place] FDA and has been authorized for detection and/or diagnosis of SARS-CoV-2 by FDA under an Emergency Use Authorization (EUA). This EUA will remain in effect (meaning this test can be used) for the duration of the COVID-19 declaration under Section 564(b)(1) of the Act, 21 U.S.C. section 360bbb-3(b)(1), unless the authorization is terminated or revoked.  Performed at [facility], [place] [address] [place], [place] [address]     Procedures and diagnostic studies:  DG ERCP BILIARY & PANCREATIC DUCTS  Result Date: 08/21/2020 CLINICAL DATA:  Choledocholithiasis, cholelithiasis EXAM: ERCP TECHNIQUE: Multiple spot images obtained with the fluoroscopic device and submitted for interpretation post-procedure. COMPARISON:  MR 08/19/2020 FINDINGS: A series of fluoroscopic spot images document endoscopic cannulation and opacification of the CBD. Filling defects are noted in the proximal missed distal CBD which is mildly ectatic. Images document balloon catheter passage through the CBD. There is incomplete opacification of the intrahepatic biliary tree, which appears  decompressed centrally. No extravasation is identified. IMPRESSION: Choledocholithiasis with endoscopic CBD cannulation and intervention as above These images were submitted for radiologic interpretation only. Please see the procedural report for the amount of contrast and the fluoroscopy time utilized. Electronically Signed   By: Lucrezia Europe M.D.   On: 08/21/2020 10:29   MR ABDOMEN MRCP W WO CONTAST  Result Date: 08/19/2020 CLINICAL DATA:  Abdominal pain biliary obstruction suspected. EXAM: MRI ABDOMEN WITHOUT AND WITH CONTRAST (INCLUDING MRCP) TECHNIQUE: Multiplanar multisequence MR imaging of the abdomen was performed both before and after  the administration of intravenous contrast. Heavily T2-weighted images of the biliary and pancreatic ducts were obtained, and three-dimensional MRCP images were rendered by post processing. CONTRAST:  8.102mL GADAVIST GADOBUTROL 1 MMOL/ML IV SOLN COMPARISON:  Day abdominal ultrasound. CT abdomen pelvis November 23, 2014. FINDINGS: Lower chest: No acute abnormality. Median sternotomy with postsurgical change in the anterior abdominal wall. Hepatobiliary: No hepatic steatosis. No suspicious hepatic lesions. Cholelithiasis gallbladder distension but without wall thickening or pericholecystic fluid. There is mild intrahepatic biliary ductal dilation. The common duct is dilated measuring 10 mm with mild likely reactive peribiliary enhancement. There are multiple filling defects in the common bile duct measuring up to 8 mm on image 12/18. The cystic duct is dilated with a 6 mm stone in the duct at the confluence with the common duct on image 10/18. Pancreas: No mass, inflammatory changes, or other parenchymal abnormality identified. Spleen:  Within normal limits in size and appearance. Adrenals/Urinary Tract: Bilateral adrenal glands are unremarkable. 8 mm Bosniak category 2 hemorrhagic/proteinaceous right renal cyst. 5 mm Bosniak category left renal cyst. No solid enhancing renal  lesions. No evidence of hydronephrosis. Stomach/Bowel: Visualized portions within the abdomen are unremarkable. Vascular/Lymphatic: Aortic atherosclerosis without aneurysmal dilation. Pathologically enlarged abdominal lymph nodes. Other:  No abdominal ascites which Musculoskeletal: No suspicious bone lesions identified. IMPRESSION: 1. Multiple common duct choledocholithiasis measuring up to 8 mm with a 6 mm choledocholithiasis in the cystic duct as well as mild intra and extrahepatic biliary ductal dilation. 2. Cholelithiasis with gallbladder distension but without other findings acute cholecystitis. These results will be called to the ordering clinician or representative by the Radiologist Assistant, and communication documented in the PACS or [employer]. Electronically Signed   By: Dahlia Bailiff MD   On: 08/19/2020 23:17    Medications:   . atorvastatin  80 mg Oral Daily  . carvedilol  3.125 mg Oral BID WC  . docusate sodium  100 mg Oral BID  . [START ON 08/22/2020] indomethacin  100 mg Rectal Once  . insulin aspart  0-15 Units Subcutaneous TID WC  . pantoprazole  40 mg Oral Daily  . sacubitril-valsartan  1 tablet Oral BID   Continuous Infusions: . [START ON 08/22/2020] ampicillin-sulbactam (UNASYN) 1.5 g IVPB (Mini-Bag Plus)    . lactated ringers 25 mL/hr at 08/21/20 0743     LOS: 2 days   Geradine Girt  [facility]   How to contact the [facility] Attending or Consulting provider [place] or covering provider during after hours [place], for this patient?  1. Check the care team in [facility] and look for a) attending/consulting [facility] provider listed and b) the [facility] team listed 2. Log into [email or web address] and use [place]'s universal password to access. If you do not have the password, please contact the hospital operator. 3. Locate the [facility] provider you are looking for under [facility] and page to a number that you can be directly reached. 4. If you still have difficulty reaching the  provider, please page the [facility] (Director on Call) for the Hospitalists listed on amion for assistance.  08/21/2020, 1:51 PM

## 2020-08-21 NOTE — Progress Notes (Addendum)
Day of Surgery   Subjective/Chief Complaint: Pt seen in PACU after successful ERCP. Denies abdominal pain or nausea.    Objective: Vital signs in last 24 hours: Temp:  [97.4 F (36.3 C)-99.2 F (37.3 C)] 97.4 F (36.3 C) (04/24 0850) Pulse Rate:  [60-71] 64 (04/24 0905) Resp:  [15-61] 18 (04/24 0905) BP: (124-169)/(58-83) 162/83 (04/24 0905) SpO2:  [97 %-100 %] 100 % (04/24 0905) Weight:  [94.3 kg] 94.3 kg (04/24 0728) Last BM Date: 08/20/20  Intake/Output from previous day: 04/23 0701 - 04/24 0700 In: 1490.5 [P.O.:480; I.V.:1010.5] Out: 400 [Urine:400] Intake/Output this shift: Total I/O In: 1000 [I.V.:800; IV Piggyback:200] Out: -   General appearance: alert and cooperative Resp: unlabored Cardio: regular rate and rhythm and heparin drip running GI: soft, non-tender; bowel sounds normal; no masses,  no organomegaly Skin: Skin color, texture, turgor normal. No rashes or lesions Neurologic: Grossly normal  Lab Results:  Recent Labs    08/20/20 0805 08/21/20 0348  WBC 9.2 8.0  HGB 16.7 14.9  HCT 53.4* 47.2  PLT 187 190   BMET Recent Labs    08/20/20 0805 08/21/20 0348  NA 137 136  K 4.9 4.4  CL 104 105  CO2 21* 23  GLUCOSE 113* 122*  BUN 24* 23  CREATININE 1.65* 1.66*  CALCIUM 9.2 8.9   PT/INR Recent Labs    08/19/20 0935  LABPROT 13.4  INR 1.0   ABG No results for input(s): PHART, HCO3 in the last 72 hours.  Invalid input(s): PCO2, PO2  Studies/Results: MR ABDOMEN MRCP W WO CONTAST  Result Date: 08/19/2020 CLINICAL DATA:  Abdominal pain biliary obstruction suspected. EXAM: MRI ABDOMEN WITHOUT AND WITH CONTRAST (INCLUDING MRCP) TECHNIQUE: Multiplanar multisequence MR imaging of the abdomen was performed both before and after the administration of intravenous contrast. Heavily T2-weighted images of the biliary and pancreatic ducts were obtained, and three-dimensional MRCP images were rendered by post processing. CONTRAST:  8.70mL GADAVIST  GADOBUTROL 1 MMOL/ML IV SOLN COMPARISON:  Day abdominal ultrasound. CT abdomen pelvis November 23, 2014. FINDINGS: Lower chest: No acute abnormality. Median sternotomy with postsurgical change in the anterior abdominal wall. Hepatobiliary: No hepatic steatosis. No suspicious hepatic lesions. Cholelithiasis gallbladder distension but without wall thickening or pericholecystic fluid. There is mild intrahepatic biliary ductal dilation. The common duct is dilated measuring 10 mm with mild likely reactive peribiliary enhancement. There are multiple filling defects in the common bile duct measuring up to 8 mm on image 12/18. The cystic duct is dilated with a 6 mm stone in the duct at the confluence with the common duct on image 10/18. Pancreas: No mass, inflammatory changes, or other parenchymal abnormality identified. Spleen:  Within normal limits in size and appearance. Adrenals/Urinary Tract: Bilateral adrenal glands are unremarkable. 8 mm Bosniak category 2 hemorrhagic/proteinaceous right renal cyst. 5 mm Bosniak category left renal cyst. No solid enhancing renal lesions. No evidence of hydronephrosis. Stomach/Bowel: Visualized portions within the abdomen are unremarkable. Vascular/Lymphatic: Aortic atherosclerosis without aneurysmal dilation. Pathologically enlarged abdominal lymph nodes. Other:  No abdominal ascites which Musculoskeletal: No suspicious bone lesions identified. IMPRESSION: 1. Multiple common duct choledocholithiasis measuring up to 8 mm with a 6 mm choledocholithiasis in the cystic duct as well as mild intra and extrahepatic biliary ductal dilation. 2. Cholelithiasis with gallbladder distension but without other findings acute cholecystitis. These results will be called to the ordering clinician or representative by the Radiologist Assistant, and communication documented in the PACS or [employer]. Electronically Signed   By: Dellis Filbert  Nance Pew MD   On: 08/19/2020 23:17   US Abdomen Limited RUQ  (LIVER/GB)  Result Date: 08/19/2020 CLINICAL DATA:  Intermittent epigastric pain EXAM: ULTRASOUND ABDOMEN LIMITED RIGHT UPPER QUADRANT COMPARISON:  CT AP from 11/23/2014 FINDINGS: Gallbladder: Gallstones measuring up to 1.3 cm are noted. No gallbladder wall thickening, pericholecystic fluid or sonographic Murphy's sign. Common bile duct: Diameter: 10.3 mm. Liver: Increased echogenicity. Contour the liver is slightly irregular. Intrahepatic bile duct dilatation identified, mild. Portal vein is patent on color Doppler imaging with normal direction of blood flow towards the liver. Other: None. IMPRESSION: 1. Gallstones.  No secondary signs of acute cholecystitis. 2. Increase caliber of the common bile duct with mild intrahepatic biliary dilatation. Cannot exclude choledocholithiasis. Recommend further evaluation with MRCP/MRI with contrast material. 3. Increased echogenicity of the liver with slightly nodular contour. This could be better assessed with MRI of the abdomen. Electronically Signed   By: Kerby Moors M.D.   On: 08/19/2020 12:18    Anti-infectives: Anti-infectives (From admission, onward)   None      Assessment/Plan:  CHF (EF 25-30% on Echo 12/10/18) w/ AICD in place CAD s/p CABG on Eliquis (last dose 4/21 am) HTN PVD  DM2   Cholelithiasis Possible Choledocholithiasis Elevated LFT's S/p ERCP (Dr. Benson Norway) with extraction of 3 stones and large sphincterotomy. Heparin to be held 48h post-procedure to minimize bleeding risk. Will tentatively plan lap chole tomorrow with Dr. Donne Hazel.   FEN - please keep NPO after midnight VTE - SCDs, heparin gtt (held, planning to hold 48 s/p ercp) ID - None indicated from a general surgery standpoint   LOS: 2 days    Clovis Riley 08/21/2020

## 2020-08-22 ENCOUNTER — Encounter (HOSPITAL_COMMUNITY): Payer: Self-pay | Admitting: Internal Medicine

## 2020-08-22 ENCOUNTER — Inpatient Hospital Stay (HOSPITAL_COMMUNITY): Payer: Medicare Other | Admitting: Anesthesiology

## 2020-08-22 ENCOUNTER — Encounter (HOSPITAL_COMMUNITY)
Admission: EM | Disposition: A | Discharge status: Home / Self Care | Payer: Self-pay | Source: Home / Self Care | Attending: Internal Medicine

## 2020-08-22 HISTORY — PX: CHOLECYSTECTOMY: SHX55

## 2020-08-22 LAB — COMPREHENSIVE METABOLIC PANEL
ALT: 82 U/L — ABNORMAL HIGH (ref 0–44)
AST: 56 U/L — ABNORMAL HIGH (ref 15–41)
Albumin: 3.3 g/dL — ABNORMAL LOW (ref 3.5–5.0)
Alkaline Phosphatase: 220 U/L — ABNORMAL HIGH (ref 38–126)
Anion gap: 7 (ref 5–15)
BUN: 19 mg/dL (ref 8–23)
CO2: 25 mmol/L (ref 22–32)
Calcium: 9.2 mg/dL (ref 8.9–10.3)
Chloride: 106 mmol/L (ref 98–111)
Creatinine, Ser: 1.79 mg/dL — ABNORMAL HIGH (ref 0.61–1.24)
GFR, Estimated: 39 mL/min — ABNORMAL LOW (ref >60)
Glucose, Bld: 148 mg/dL — ABNORMAL HIGH (ref 70–99)
Potassium: 5.3 mmol/L — ABNORMAL HIGH (ref 3.5–5.1)
Sodium: 138 mmol/L (ref 135–145)
Total Bilirubin: 1.7 mg/dL — ABNORMAL HIGH (ref 0.3–1.2)
Total Protein: 6.7 g/dL (ref 6.5–8.1)

## 2020-08-22 LAB — GLUCOSE, CAPILLARY
Glucose-Capillary: 148 mg/dL — ABNORMAL HIGH (ref 70–99)
Glucose-Capillary: 141 mg/dL — ABNORMAL HIGH (ref 70–99)
Glucose-Capillary: 192 mg/dL — ABNORMAL HIGH (ref 70–99)

## 2020-08-22 LAB — CBC
HCT: 48.1 % (ref 39.0–52.0)
Hemoglobin: 15 g/dL (ref 13.0–17.0)
MCH: 28.6 pg (ref 26.0–34.0)
MCHC: 31.2 g/dL (ref 30.0–36.0)
MCV: 91.8 fL (ref 80.0–100.0)
Platelets: 191 10*3/uL (ref 150–400)
RBC: 5.24 MIL/uL (ref 4.22–5.81)
RDW: 14.9 % (ref 11.5–15.5)
WBC: 6.3 10*3/uL (ref 4.0–10.5)
nRBC: 0 % (ref 0.0–0.2)

## 2020-08-22 LAB — HEPARIN LEVEL (UNFRACTIONATED): Heparin Unfractionated: <0.1 IU/mL — ABNORMAL LOW (ref 0.30–0.70)

## 2020-08-22 SURGERY — LAPAROSCOPIC CHOLECYSTECTOMY
Anesthesia: General | Site: Abdomen

## 2020-08-22 MED ORDER — ORAL CARE MOUTH RINSE: 15.0000 mL | Freq: Once | OROMUCOSAL | Status: AC

## 2020-08-22 MED ORDER — SODIUM ZIRCONIUM CYCLOSILICATE 5 G PO PACK
5.0000 g | PACK | Freq: Once | ORAL | Status: AC
  Administered 2020-08-22: 5 g via ORAL
  Filled 2020-08-22: qty 1

## 2020-08-22 MED ORDER — SUCCINYLCHOLINE CHLORIDE 200 MG/10ML IV SOSY
PREFILLED_SYRINGE | INTRAVENOUS | Status: AC
  Filled 2020-08-22: qty 10

## 2020-08-22 MED ORDER — ONDANSETRON HCL 4 MG/2ML IJ SOLN
INTRAMUSCULAR | Status: DC | PRN
  Administered 2020-08-22: 4 mg via INTRAVENOUS

## 2020-08-22 MED ORDER — LIDOCAINE 2% (20 MG/ML) 5 ML SYRINGE
INTRAMUSCULAR | Status: AC
  Filled 2020-08-22: qty 15

## 2020-08-22 MED ORDER — HEMOSTATIC AGENTS (NO CHARGE) OPTIME
TOPICAL | Status: DC | PRN
  Administered 2020-08-22: 1 via TOPICAL

## 2020-08-22 MED ORDER — SODIUM CHLORIDE 0.9 % IV SOLN
2.0000 g | INTRAVENOUS | Status: DC
  Administered 2020-08-22: 2 g via INTRAVENOUS
  Filled 2020-08-22 (×2): qty 20

## 2020-08-22 MED ORDER — OXYCODONE HCL 5 MG/5ML PO SOLN
5.0000 mg | Freq: Once | ORAL | Status: DC | PRN
Start: 2020-08-22 — End: 2020-08-22

## 2020-08-22 MED ORDER — CHLORHEXIDINE GLUCONATE 0.12 % MT SOLN: 15.0000 mL | Freq: Once | OROMUCOSAL | Status: AC

## 2020-08-22 MED ORDER — SODIUM CHLORIDE 0.9 % IR SOLN
Status: DC | PRN
  Administered 2020-08-22: 1000 mL

## 2020-08-22 MED ORDER — CEFAZOLIN SODIUM 1 G IJ SOLR
INTRAMUSCULAR | Status: AC
  Filled 2020-08-22: qty 20

## 2020-08-22 MED ORDER — SCOPOLAMINE 1 MG/3DAYS TD PT72
MEDICATED_PATCH | TRANSDERMAL | Status: AC
  Filled 2020-08-22: qty 1

## 2020-08-22 MED ORDER — ONDANSETRON HCL 4 MG/2ML IJ SOLN
INTRAMUSCULAR | Status: AC
  Filled 2020-08-22: qty 4

## 2020-08-22 MED ORDER — ELIQUIS 5 MG PO TABS: 5.0000 mg | ORAL_TABLET | Freq: Two times a day (BID) | ORAL | 3 refills | Status: DC

## 2020-08-22 MED ORDER — ROCURONIUM BROMIDE 10 MG/ML (PF) SYRINGE
PREFILLED_SYRINGE | INTRAVENOUS | Status: AC
  Filled 2020-08-22: qty 10

## 2020-08-22 MED ORDER — EMPAGLIFLOZIN 10 MG PO TABS: 10.0000 mg | ORAL_TABLET | Freq: Every day | ORAL | 3 refills | Status: DC

## 2020-08-22 MED ORDER — ETOMIDATE 2 MG/ML IV SOLN
INTRAVENOUS | Status: AC
  Filled 2020-08-22: qty 10

## 2020-08-22 MED ORDER — HYDROCODONE-ACETAMINOPHEN 5-325 MG PO TABS: 1.0000 | ORAL_TABLET | Freq: Four times a day (QID) | ORAL | 0 refills | Status: DC | PRN

## 2020-08-22 MED ORDER — FENTANYL CITRATE (PF) 250 MCG/5ML IJ SOLN
INTRAMUSCULAR | Status: AC
  Filled 2020-08-22: qty 5

## 2020-08-22 MED ORDER — CHLORHEXIDINE GLUCONATE 0.12 % MT SOLN
OROMUCOSAL | Status: AC
  Administered 2020-08-22: 15 mL via OROMUCOSAL
  Filled 2020-08-22: qty 15

## 2020-08-22 MED ORDER — LIDOCAINE 2% (20 MG/ML) 5 ML SYRINGE
INTRAMUSCULAR | Status: AC
  Filled 2020-08-22: qty 5

## 2020-08-22 MED ORDER — 0.9 % SODIUM CHLORIDE (POUR BTL) OPTIME
TOPICAL | Status: DC | PRN
  Administered 2020-08-22: 1000 mL

## 2020-08-22 MED ORDER — PHENYLEPHRINE 40 MCG/ML (10ML) SYRINGE FOR IV PUSH (FOR BLOOD PRESSURE SUPPORT)
PREFILLED_SYRINGE | INTRAVENOUS | Status: AC
  Filled 2020-08-22: qty 20

## 2020-08-22 MED ORDER — EPHEDRINE SULFATE-NACL 50-0.9 MG/10ML-% IV SOSY
PREFILLED_SYRINGE | INTRAVENOUS | Status: DC | PRN
  Administered 2020-08-22: 15 mg via INTRAVENOUS

## 2020-08-22 MED ORDER — ETOMIDATE 2 MG/ML IV SOLN
INTRAVENOUS | Status: DC | PRN
  Administered 2020-08-22: 4 mg via INTRAVENOUS
  Administered 2020-08-22: 12 mg via INTRAVENOUS

## 2020-08-22 MED ORDER — ENTRESTO 97-103 MG PO TABS: 1.0000 | ORAL_TABLET | Freq: Two times a day (BID) | ORAL | Status: DC

## 2020-08-22 MED ORDER — DEXAMETHASONE SODIUM PHOSPHATE 10 MG/ML IJ SOLN
INTRAMUSCULAR | Status: AC
  Filled 2020-08-22: qty 2

## 2020-08-22 MED ORDER — PROPOFOL 10 MG/ML IV BOLUS
INTRAVENOUS | Status: AC
  Filled 2020-08-22: qty 20

## 2020-08-22 MED ORDER — LACTATED RINGERS IV SOLN: INTRAVENOUS | Status: DC

## 2020-08-22 MED ORDER — ROCURONIUM BROMIDE 10 MG/ML (PF) SYRINGE
PREFILLED_SYRINGE | INTRAVENOUS | Status: DC | PRN
  Administered 2020-08-22: 50 mg via INTRAVENOUS
  Administered 2020-08-22: 10 mg via INTRAVENOUS

## 2020-08-22 MED ORDER — DEXMEDETOMIDINE (PRECEDEX) IN NS 20 MCG/5ML (4 MCG/ML) IV SYRINGE
PREFILLED_SYRINGE | INTRAVENOUS | Status: AC
  Filled 2020-08-22: qty 10

## 2020-08-22 MED ORDER — OXYCODONE HCL 5 MG PO TABS: 5.0000 mg | ORAL_TABLET | Freq: Once | ORAL | Status: DC | PRN

## 2020-08-22 MED ORDER — FENTANYL CITRATE (PF) 100 MCG/2ML IJ SOLN
25.0000 ug | INTRAMUSCULAR | Status: DC | PRN
  Administered 2020-08-22: 50 ug via INTRAVENOUS

## 2020-08-22 MED ORDER — LIDOCAINE 2% (20 MG/ML) 5 ML SYRINGE
INTRAMUSCULAR | Status: DC | PRN
  Administered 2020-08-22: 80 mg via INTRAVENOUS

## 2020-08-22 MED ORDER — FENTANYL CITRATE (PF) 100 MCG/2ML IJ SOLN
INTRAMUSCULAR | Status: AC
  Filled 2020-08-22: qty 2

## 2020-08-22 MED ORDER — DEXAMETHASONE SODIUM PHOSPHATE 10 MG/ML IJ SOLN
INTRAMUSCULAR | Status: DC | PRN
  Administered 2020-08-22: 5 mg via INTRAVENOUS

## 2020-08-22 MED ORDER — CEFAZOLIN SODIUM-DEXTROSE 2-3 GM-%(50ML) IV SOLR
INTRAVENOUS | Status: DC | PRN
  Administered 2020-08-22: 2 g via INTRAVENOUS

## 2020-08-22 MED ORDER — FENTANYL CITRATE (PF) 250 MCG/5ML IJ SOLN
INTRAMUSCULAR | Status: DC | PRN
  Administered 2020-08-22 (×7): 50 ug via INTRAVENOUS

## 2020-08-22 MED ORDER — ROCURONIUM BROMIDE 10 MG/ML (PF) SYRINGE
PREFILLED_SYRINGE | INTRAVENOUS | Status: AC
  Filled 2020-08-22: qty 30

## 2020-08-22 MED ORDER — BUPIVACAINE-EPINEPHRINE 0.25% -1:200000 IJ SOLN
INTRAMUSCULAR | Status: DC | PRN
  Administered 2020-08-22: 13 mL

## 2020-08-22 MED ORDER — ONDANSETRON HCL 4 MG/2ML IJ SOLN: 4.0000 mg | Freq: Once | INTRAMUSCULAR | Status: DC | PRN

## 2020-08-22 MED ORDER — METFORMIN HCL ER 500 MG PO TB24: 500.0000 mg | ORAL_TABLET | Freq: Every day | ORAL | Status: DC

## 2020-08-22 MED ORDER — BUPIVACAINE-EPINEPHRINE (PF) 0.25% -1:200000 IJ SOLN
INTRAMUSCULAR | Status: AC
  Filled 2020-08-22: qty 30

## 2020-08-22 SURGICAL SUPPLY — 42 items
ADH SKN CLS APL DERMABOND .7 (GAUZE/BANDAGES/DRESSINGS) ×1
APL PRP STRL LF DISP 70% ISPRP (MISCELLANEOUS) ×1
APPLIER CLIP 5 13 M/L LIGAMAX5 (MISCELLANEOUS) ×2
APR CLP MED LRG 5 ANG JAW (MISCELLANEOUS) ×1
BAG SPEC RTRVL 10 TROC 200 (ENDOMECHANICALS) ×1
BLADE CLIPPER SURG (BLADE) ×1 IMPLANT
CANISTER SUCT 3000ML PPV (MISCELLANEOUS) ×2 IMPLANT
CHLORAPREP W/TINT 26 (MISCELLANEOUS) ×2 IMPLANT
CLIP APPLIE 5 13 M/L LIGAMAX5 (MISCELLANEOUS) ×1 IMPLANT
COVER SURGICAL LIGHT HANDLE (MISCELLANEOUS) ×2 IMPLANT
COVER WAND RF STERILE (DRAPES) ×2 IMPLANT
DERMABOND ADVANCED (GAUZE/BANDAGES/DRESSINGS) ×1
DERMABOND ADVANCED .7 DNX12 (GAUZE/BANDAGES/DRESSINGS) ×1 IMPLANT
ELECT REM PT RETURN 9FT ADLT (ELECTROSURGICAL) ×2
ELECTRODE REM PT RTRN 9FT ADLT (ELECTROSURGICAL) ×1 IMPLANT
GLOVE BIO SURGEON STRL SZ7 (GLOVE) ×2 IMPLANT
GLOVE BIOGEL PI IND STRL 7.5 (GLOVE) ×1 IMPLANT
GLOVE BIOGEL PI INDICATOR 7.5 (GLOVE) ×1
GOWN STRL REUS W/ TWL LRG LVL3 (GOWN DISPOSABLE) ×3 IMPLANT
GOWN STRL REUS W/TWL LRG LVL3 (GOWN DISPOSABLE) ×6
GRASPER SUT TROCAR 14GX15 (MISCELLANEOUS) ×2 IMPLANT
HEMOSTAT SNOW SURGICEL 2X4 (HEMOSTASIS) ×1 IMPLANT
KIT BASIN OR (CUSTOM PROCEDURE TRAY) ×2 IMPLANT
KIT TURNOVER KIT B (KITS) ×2 IMPLANT
NS IRRIG 1000ML POUR BTL (IV SOLUTION) ×2 IMPLANT
PAD ARMBOARD 7.5X6 YLW CONV (MISCELLANEOUS) ×2 IMPLANT
POUCH RETRIEVAL ECOSAC 10 (ENDOMECHANICALS) ×1 IMPLANT
POUCH RETRIEVAL ECOSAC 10MM (ENDOMECHANICALS) ×2
SCISSORS LAP 5X35 DISP (ENDOMECHANICALS) ×2 IMPLANT
SET IRRIG TUBING LAPAROSCOPIC (IRRIGATION / IRRIGATOR) ×2 IMPLANT
SET TUBE SMOKE EVAC HIGH FLOW (TUBING) ×2 IMPLANT
SLEEVE ENDOPATH XCEL 5M (ENDOMECHANICALS) ×4 IMPLANT
SPECIMEN JAR SMALL (MISCELLANEOUS) ×2 IMPLANT
STRIP CLOSURE SKIN 1/2X4 (GAUZE/BANDAGES/DRESSINGS) ×2 IMPLANT
SUT MNCRL AB 4-0 PS2 18 (SUTURE) ×2 IMPLANT
SUT VICRYL 0 UR6 27IN ABS (SUTURE) ×2 IMPLANT
TOWEL GREEN STERILE (TOWEL DISPOSABLE) ×2 IMPLANT
TOWEL GREEN STERILE FF (TOWEL DISPOSABLE) ×2 IMPLANT
TRAY LAPAROSCOPIC MC (CUSTOM PROCEDURE TRAY) ×2 IMPLANT
TROCAR XCEL BLUNT TIP 100MML (ENDOMECHANICALS) ×2 IMPLANT
TROCAR XCEL NON-BLD 5MMX100MML (ENDOMECHANICALS) ×2 IMPLANT
WATER STERILE IRR 1000ML POUR (IV SOLUTION) ×2 IMPLANT

## 2020-08-22 NOTE — Progress Notes (Signed)
Progress Note  1 Day Post-Op  Subjective: Patient denies abdominal pain, nausea, distention. Hopeful to get gallbladder out and would like to go home later today if possible.   Objective: Vital signs in last 24 hours: Temp:  [97.6 F (36.4 C)-98.6 F (37 C)] 97.8 F (36.6 C) (04/25 0440) Pulse Rate:  [61-64] 61 (04/25 0752) Resp:  [17-18] 18 (04/25 0440) BP: (126-160)/(62-76) 136/67 (04/25 0752) SpO2:  [96 %-98 %] 96 % (04/25 0440) Last BM Date: 08/21/20  Intake/Output from previous day: 04/24 0701 - 04/25 0700 In: 1000 [I.V.:800; IV Piggyback:200] Out: -  Intake/Output this shift: No intake/output data recorded.  PE: General: pleasant, WD, WN male who is laying in bed in NAD HEENT: Sclera are anicteric Heart: regular, rate, and rhythm.  Normal s1,s2. No obvious murmurs, gallops, or rubs noted.  Palpable radial and pedal pulses bilaterally Lungs: CTAB, no wheezes, rhonchi, or rales noted.  Respiratory effort nonlabored Abd: soft, NT, ND, +BS, no masses, hernias, or organomegaly MS: all 4 extremities are symmetrical with no cyanosis, clubbing, or edema. Skin: warm and dry with no masses, lesions, or rashes Neuro: Cranial nerves 2-12 grossly intact, sensation is normal throughout Psych: A&Ox3 with an appropriate affect.    Lab Results:  Recent Labs    08/20/20 0805 08/21/20 0348  WBC 9.2 8.0  HGB 16.7 14.9  HCT 53.4* 47.2  PLT 187 190   BMET Recent Labs    08/20/20 0805 08/21/20 0348  NA 137 136  K 4.9 4.4  CL 104 105  CO2 21* 23  GLUCOSE 113* 122*  BUN 24* 23  CREATININE 1.65* 1.66*  CALCIUM 9.2 8.9   PT/INR No results for input(s): LABPROT, INR in the last 72 hours. CMP     Component Value Date/Time   NA 136 08/21/2020 0348   NA 138 02/03/2020 1056   K 4.4 08/21/2020 0348   CL 105 08/21/2020 0348   CO2 23 08/21/2020 0348   GLUCOSE 122 (H) 08/21/2020 0348   BUN 23 08/21/2020 0348   BUN 20 02/03/2020 1056   CREATININE 1.66 (H) 08/21/2020  0348   CALCIUM 8.9 08/21/2020 0348   PROT 6.1 (L) 08/21/2020 0348   ALBUMIN 3.0 (L) 08/21/2020 0348   AST 62 (H) 08/21/2020 0348   ALT 86 (H) 08/21/2020 0348   ALKPHOS 225 (H) 08/21/2020 0348   BILITOT 1.5 (H) 08/21/2020 0348   GFRNONAA 43 (L) 08/21/2020 0348   GFRAA 55 (L) 02/03/2020 1056   Lipase     Component Value Date/Time   LIPASE 54 (H) 08/19/2020 0930       Studies/Results: DG ERCP BILIARY & PANCREATIC DUCTS  Result Date: 08/21/2020 CLINICAL DATA:  Choledocholithiasis, cholelithiasis EXAM: ERCP TECHNIQUE: Multiple spot images obtained with the fluoroscopic device and submitted for interpretation post-procedure. COMPARISON:  MR 08/19/2020 FINDINGS: A series of fluoroscopic spot images document endoscopic cannulation and opacification of the CBD. Filling defects are noted in the proximal missed distal CBD which is mildly ectatic. Images document balloon catheter passage through the CBD. There is incomplete opacification of the intrahepatic biliary tree, which appears decompressed centrally. No extravasation is identified. IMPRESSION: Choledocholithiasis with endoscopic CBD cannulation and intervention as above These images were submitted for radiologic interpretation only. Please see the procedural report for the amount of contrast and the fluoroscopy time utilized. Electronically Signed   By: Lucrezia Europe M.D.   On: 08/21/2020 10:29    Anti-infectives: Anti-infectives (From admission, onward)   Start  Dose/Rate Route Frequency Ordered Stop   08/22/20 1045  cefTRIAXone (ROCEPHIN) 2 g in sodium chloride 0.9 % 100 mL IVPB        2 g 200 mL/hr over 30 Minutes Intravenous Every 24 hours 08/22/20 0954     08/22/20 0700  ampicillin-sulbactam (UNASYN) 1.5 g in sodium chloride 0.9 % 100 mL IVPB  Status:  Discontinued        1.5 g 200 mL/hr over 30 Minutes Intravenous On call 08/21/20 0943 08/22/20 0954       Assessment/Plan CHF (EF 25-30% on Echo 12/10/18) w/ AICD in place -  cards feels patient is moderate risk  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 Benson Norway) with extraction of 3 stones and large sphincterotomy. Heparin to be held 48h post-procedure to minimize bleeding risk.  - AM labs pending  - to OR for lap chole today   FEN -NPO VTE -SCDs, heparin gtt (held, planning to hold 48 s/p ercp) ID -rocephin pre-op  LOS: 3 days    Norm Parcel, [facility] 08/22/2020, 9:54 AM Please see Amion for pager number during day hours 7:00am-4:30pm

## 2020-08-22 NOTE — Anesthesia Procedure Notes (Signed)
Procedure Name: Intubation Date/Time: 08/22/2020 12:21 PM Performed by: Janace Litten, CRNA Pre-anesthesia Checklist: Patient identified, Emergency Drugs available, Suction available and Patient being monitored Patient Re-evaluated:Patient Re-evaluated prior to induction Oxygen Delivery Method: Circle System Utilized Preoxygenation: Pre-oxygenation with 100% oxygen Induction Type: IV induction Ventilation: Mask ventilation without difficulty Laryngoscope Size: Mac and 4 Grade View: Grade I Tube type: Oral Tube size: 7.5 mm Number of attempts: 1 Airway Equipment and Method: Stylet Placement Confirmation: ETT inserted through vocal cords under direct vision,  positive ETCO2 and breath sounds checked- equal and bilateral Secured at: 21 cm Tube secured with: Tape Dental Injury: Teeth and Oropharynx as per pre-operative assessment

## 2020-08-22 NOTE — Transfer of Care (Signed)
Immediate Anesthesia Transfer of Care Note  Patient: [name] [name]  Procedure(s) Performed: LAPAROSCOPIC CHOLECYSTECTOMY (N/A Abdomen)  Patient Location: PACU  Anesthesia Type:General  Level of Consciousness: awake, alert  and patient cooperative  Airway & Oxygen Therapy: Patient Spontanous Breathing  Post-op Assessment: Report given to RN and Post -op Vital signs reviewed and stable  Post vital signs: Reviewed and stable  Last Vitals:  Vitals Value Taken Time  BP    Temp    Pulse 74 08/22/20 1330  Resp 13 08/22/20 1330  SpO2 99 % 08/22/20 1330  Vitals shown include unvalidated device data.  Last Pain:  Vitals:   08/22/20 0757  TempSrc:   PainSc: 0-No pain         Complications: No complications documented.

## 2020-08-22 NOTE — Progress Notes (Signed)
RN went over discharge instructions with the patient and his wife. IV has been removed, and one new medication escribed to home pharmacy. Pt got himself dressed and will be wheeled down wife at bedside belongings packed with patient.

## 2020-08-22 NOTE — Op Note (Signed)
Preoperative diagnosis: choledocholithiasis s/p ercp Postoperative diagnosis: Same as above, chronic cholecystitis Surgery: Laparoscopic cholecystectomy Surgeon: Dr. Serita Grammes Asst: Barkley Boards, PA-C Anesthesia: General Estimated blood loss: Minimal Drains: None Specimens: Gallbladder and contents to pathology Complications: None Special count was correct x2 at end of operation Disposition to recovery stable condition  Indications: 73 yom with gallstones s/p ercp for choledocholithiasis yesterday. We discussed proceeding with a lap chole today.    Procedure: After informed consent was obtained the patient was taken to the operating room. He was given antibiotics. SCDs were in place. He was placed under general anesthesia without complication. He was prepped and draped in the standard sterile surgical fashion. Surgical timeout was then performed.  I infiltrated Marcaine below his umbilicus. I made a vertical incision. I dissected down and grasped the fascia. I incised this sharply and entered the peritoneum bluntly. There was no evidence of an entry injury. I placed a 0 Vicryl pursestring suture through the fascia. I then inserted a Hassan trocar and insufflated the abdomen to 15 mmHg pressure. I then inserted 3 further 5 mm trocars in epigastrium and right side of the abdomen under direct vision without complication. The gallbladder was tense and did have evidence of chronic cholecystitis. I then retracted her gallbladder cephalad. There were omental adhesions that I took down bluntly. I then was able to retract it laterally. I eventually was able to dissect the triangle and obtain the critical view of safety. I took the gallbladder off the cystic plate of the liver to ensure that this was the correct view. I then clipped the cystic duct with 3 clips and divided it. I left 2 clips in place. The duct was viable and the clips completely traversed the duct. I treated  the artery in a similar fashion. I then took the gallbladder off the liver bed and placed it in a retrieval bag.  I then tied my pursestring down. Hemostasis was observed. I irrigated and this was clear. I then desufflated the abdomen remove the remaining trocars. I closed these with 4-0 Monocryl and glue. He tolerated this well was extubated and transferred to recovery stable.

## 2020-08-22 NOTE — Discharge Summary (Signed)
Physician Discharge Summary  [name] [name] GYK:[id number] DOB: December 01, 1945 DOA: 08/19/2020  PCP: Christain Sacramento, MD  Admit date: 08/19/2020 Discharge date: 08/22/2020  Admitted From: home Discharge disposition: home   Recommendations for Outpatient Follow-Up:   1. CMP 1 week   Discharge Diagnosis:   Principal Problem:   Cholelithiasis with choledocholithiasis Active Problems:   Essential hypertension   Diabetes mellitus type 2, controlled (HCC)   CAD (coronary artery disease)   Chronic combined systolic and diastolic CHF (congestive heart failure) (HCC)   CKD (chronic kidney disease), stage III ([place])    Discharge Condition: Improved.  Diet recommendation: Low sodium, heart healthy.  Carbohydrate-modified.  Wound care: None.  Code status: Full.   History of Present Illness:   [name] [name] is a 75 y.o. male with medical history significant of PVD; HTN; DM; CAD; bladder cancer; and chronic systolic CHF with AICD presenting with epigastric pain.  Sunday night, he developed midepigastric pain - this has happened before, like a strong burning sensation.  Lasted about 12 hours.  At 2 points in that time, he was shivering and felt febrile.  This was similar to prior episodes.  It resolved about 10AM the following day.  He has continued to have bloating and belching.  He saw his PCP on 4/20 and had blood work and they called today and told him to come to the ER.     Hospital Course by Problem:   Choledocholithiasis with recent biliary obstruction -Patient withacute onset of epigastric pain on Sunday; completely resolved at this time -He was seen by his PCP on 4/20 and blood work was clearly concerning for biliary obstruction; results returned today and patient was sent to ED -Despite being pain-free, he had recent bile duct obstruction and has persistent ductal dilatation with gallstones -s/p MRCP: Multiple common duct choledocholithiasis measuring up to 8  mm with a 6 mm choledocholithiasis in the cystic duct as well as mild intra and extrahepatic biliary ductal dilation. 2. Cholelithiasis with gallbladder distension but without other findings acute cholecystitis. -s/p ERCP -s/p cholecystectomy  AKIwith stage 3a CKD -follow up 1 week -improved  Chronic systolic CHF with AICD -Does not have pacer; AICD has never fired -Heparin in lieu of Eliquis -Continue Entresto, Coreg -Cards consult: From cardiac standpoint he is at moderate risk for CV complications from surgery based on his known CAD and reduced EF. However, he has been quite functional without significant cardiac limitation. Thus I think it would be safe for him to proceed to surgery with further cardiac testing or intervention. OK to hold Eliquis for up to 48 hours prior to surgery without heparin bridge.   HTN -Continue Entresto, Coreg  DM -A1c is 8, indicating poor control -hold Glucophage, Jardiance -Cover with moderate-scale SSI  Hyperkalemia -s/p lokelma x 1 -outpatient follow up     Medical Consultants:   [facility]   Discharge Exam:   Vitals:   08/22/20 1403 08/22/20 1424  BP: (!) 154/66 (!) 148/70  Pulse: 68 70  Resp: 10 16  Temp: 97.9 F (36.6 C) 97.9 F (36.6 C)  SpO2: 95% 91%   Vitals:   08/22/20 1345 08/22/20 1348 08/22/20 1403 08/22/20 1424  BP: (!) 158/69 (!) 158/69 (!) 154/66 (!) 148/70  Pulse: 68 68 68 70  Resp: 17 (!) 8 10 16   Temp:   97.9 F (36.6 C) 97.9 F (36.6 C)  TempSrc:    Oral  SpO2: 99% 96% 95% 91%  Weight:      Height:        General exam: Appears calm and comfortable.    The results of significant diagnostics from this hospitalization (including imaging, microbiology, ancillary and laboratory) are listed below for reference.     Procedures and Diagnostic Studies:   MR ABDOMEN MRCP W WO CONTAST  Result Date: 08/19/2020 CLINICAL DATA:  Abdominal pain biliary obstruction suspected. EXAM: MRI ABDOMEN WITHOUT AND  WITH CONTRAST (INCLUDING MRCP) TECHNIQUE: Multiplanar multisequence MR imaging of the abdomen was performed both before and after the administration of intravenous contrast. Heavily T2-weighted images of the biliary and pancreatic ducts were obtained, and three-dimensional MRCP images were rendered by post processing. CONTRAST:  8.91mL GADAVIST GADOBUTROL 1 MMOL/ML IV SOLN COMPARISON:  Day abdominal ultrasound. CT abdomen pelvis November 23, 2014. FINDINGS: Lower chest: No acute abnormality. Median sternotomy with postsurgical change in the anterior abdominal wall. Hepatobiliary: No hepatic steatosis. No suspicious hepatic lesions. Cholelithiasis gallbladder distension but without wall thickening or pericholecystic fluid. There is mild intrahepatic biliary ductal dilation. The common duct is dilated measuring 10 mm with mild likely reactive peribiliary enhancement. There are multiple filling defects in the common bile duct measuring up to 8 mm on image 12/18. The cystic duct is dilated with a 6 mm stone in the duct at the confluence with the common duct on image 10/18. Pancreas: No mass, inflammatory changes, or other parenchymal abnormality identified. Spleen:  Within normal limits in size and appearance. Adrenals/Urinary Tract: Bilateral adrenal glands are unremarkable. 8 mm Bosniak category 2 hemorrhagic/proteinaceous right renal cyst. 5 mm Bosniak category left renal cyst. No solid enhancing renal lesions. No evidence of hydronephrosis. Stomach/Bowel: Visualized portions within the abdomen are unremarkable. Vascular/Lymphatic: Aortic atherosclerosis without aneurysmal dilation. Pathologically enlarged abdominal lymph nodes. Other:  No abdominal ascites which Musculoskeletal: No suspicious bone lesions identified. IMPRESSION: 1. Multiple common duct choledocholithiasis measuring up to 8 mm with a 6 mm choledocholithiasis in the cystic duct as well as mild intra and extrahepatic biliary ductal dilation. 2.  Cholelithiasis with gallbladder distension but without other findings acute cholecystitis. These results will be called to the ordering clinician or representative by the Radiologist Assistant, and communication documented in the PACS or [employer]. Electronically Signed   By: Dahlia Bailiff MD   On: 08/19/2020 23:17   US Abdomen Limited RUQ (LIVER/GB)  Result Date: 08/19/2020 CLINICAL DATA:  Intermittent epigastric pain EXAM: ULTRASOUND ABDOMEN LIMITED RIGHT UPPER QUADRANT COMPARISON:  CT AP from 11/23/2014 FINDINGS: Gallbladder: Gallstones measuring up to 1.3 cm are noted. No gallbladder wall thickening, pericholecystic fluid or sonographic Murphy's sign. Common bile duct: Diameter: 10.3 mm. Liver: Increased echogenicity. Contour the liver is slightly irregular. Intrahepatic bile duct dilatation identified, mild. Portal vein is patent on color Doppler imaging with normal direction of blood flow towards the liver. Other: None. IMPRESSION: 1. Gallstones.  No secondary signs of acute cholecystitis. 2. Increase caliber of the common bile duct with mild intrahepatic biliary dilatation. Cannot exclude choledocholithiasis. Recommend further evaluation with MRCP/MRI with contrast material. 3. Increased echogenicity of the liver with slightly nodular contour. This could be better assessed with MRI of the abdomen. Electronically Signed   By: Kerby Moors M.D.   On: 08/19/2020 12:18     Labs:   Basic Metabolic Panel: Recent Labs  Lab 08/19/20 0930 08/20/20 0805 08/21/20 0348 08/22/20 1010  NA 138 137 136 138  K 4.8 4.9 4.4 5.3*  CL 107 104 105 106  CO2 23 21* 23  25  GLUCOSE 157* 113* 122* 148*  BUN 26* 24* 23 19  CREATININE 1.87* 1.65* 1.66* 1.79*  CALCIUM 9.4 9.2 8.9 9.2   GFR Estimated Creatinine Clearance: 41.5 mL/min (A) (by C-G formula based on SCr of 1.79 mg/dL (H)). Liver Function Tests: Recent Labs  Lab 08/19/20 0930 08/20/20 0805 08/21/20 0348 08/22/20 1010  AST 57* 110*  62* 56*  ALT 94* 125* 86* 82*  ALKPHOS 310* 267* 225* 220*  BILITOT 1.4* 1.9* 1.5* 1.7*  PROT 6.8 6.7 6.1* 6.7  ALBUMIN 3.4* 3.4* 3.0* 3.3*   Recent Labs  Lab 08/19/20 0930  LIPASE 54*   No results for input(s): AMMONIA in the last 168 hours. Coagulation profile Recent Labs  Lab 08/19/20 0935  INR 1.0    CBC: Recent Labs  Lab 08/19/20 0930 08/20/20 0805 08/21/20 0348 08/22/20 0602  WBC 7.9 9.2 8.0 6.3  HGB 16.8 16.7 14.9 15.0  HCT 52.6* 53.4* 47.2 48.1  MCV 91.3 91.8 89.2 91.8  PLT 187 187 190 191   Cardiac Enzymes: No results for input(s): CKTOTAL, CKMB, CKMBINDEX, TROPONINI in the last 168 hours. BNP: Invalid input(s): POCBNP CBG: Recent Labs  Lab 08/21/20 1135 08/21/20 1636 08/21/20 2159 08/22/20 1105 08/22/20 1341  GLUCAP 263* 148* 194* 148* 141*   D-Dimer No results for input(s): DDIMER in the last 72 hours. Hgb A1c No results for input(s): HGBA1C in the last 72 hours. Lipid Profile No results for input(s): CHOL, HDL, LDLCALC, TRIG, CHOLHDL, LDLDIRECT in the last 72 hours. Thyroid function studies No results for input(s): TSH, T4TOTAL, T3FREE, THYROIDAB in the last 72 hours.  Invalid input(s): FREET3 Anemia work up No results for input(s): VITAMINB12, FOLATE, FERRITIN, TIBC, IRON, RETICCTPCT in the last 72 hours. Microbiology Recent Results (from the past 240 hour(s))  Resp Panel by RT-PCR (Flu A&B, Covid) Nasopharyngeal Swab     Status: None   Collection Time: 08/19/20  9:37 AM   Specimen: Nasopharyngeal Swab; Nasopharyngeal(NP) swabs in vial transport medium  Result Value Ref Range Status   SARS Coronavirus 2 by RT PCR NEGATIVE NEGATIVE Final    Comment: (NOTE) SARS-CoV-2 target nucleic acids are NOT DETECTED.  The SARS-CoV-2 RNA is generally detectable in upper respiratory specimens during the acute phase of infection. The lowest concentration of SARS-CoV-2 viral copies this assay can detect is 138 copies/mL. A negative result does not  preclude SARS-Cov-2 infection and should not be used as the sole basis for treatment or other patient management decisions. A negative result may occur with  improper specimen collection/handling, submission of specimen other than nasopharyngeal swab, presence of viral mutation(s) within the areas targeted by this assay, and inadequate number of viral copies(<138 copies/mL). A negative result must be combined with clinical observations, patient history, and epidemiological information. The expected result is Negative.  Fact Sheet for Patients:  EntrepreneurPulse.com.au  Fact Sheet for Healthcare Providers:  IncredibleEmployment.be  This test is no t yet approved or cleared by the [place] FDA and  has been authorized for detection and/or diagnosis of SARS-CoV-2 by FDA under an Emergency Use Authorization (EUA). This EUA will remain  in effect (meaning this test can be used) for the duration of the COVID-19 declaration under Section 564(b)(1) of the Act, 21 U.S.C.section 360bbb-3(b)(1), unless the authorization is terminated  or revoked sooner.       Influenza A by PCR NEGATIVE NEGATIVE Final   Influenza B by PCR NEGATIVE NEGATIVE Final    Comment: (NOTE) The Xpert Xpress SARS-CoV-2/FLU/RSV plus  assay is intended as an aid in the diagnosis of influenza from Nasopharyngeal swab specimens and should not be used as a sole basis for treatment. Nasal washings and aspirates are unacceptable for Xpert Xpress SARS-CoV-2/FLU/RSV testing.  Fact Sheet for Patients: EntrepreneurPulse.com.au  Fact Sheet for Healthcare Providers: IncredibleEmployment.be  This test is not yet approved or cleared by the [place] FDA and has been authorized for detection and/or diagnosis of SARS-CoV-2 by FDA under an Emergency Use Authorization (EUA). This EUA will remain in effect (meaning this test can be used) for the  duration of the COVID-19 declaration under Section 564(b)(1) of the Act, 21 U.S.C. section 360bbb-3(b)(1), unless the authorization is terminated or revoked.  Performed at [facility], [place] [address], [place], [place] [address]      Discharge Instructions:   Discharge Instructions    Diet - low sodium heart healthy   Complete by: As directed    Discharge wound care:   Complete by: As directed    See attached   Increase activity slowly   Complete by: As directed      Allergies as of 08/22/2020      Reactions   Spironolactone Other (See Comments)   Painful gynecomastia   Niacin And Related       Medication List    TAKE these medications   atorvastatin 80 MG tablet Commonly known as: LIPITOR Take 1 tablet (80 mg total) by mouth daily.   carvedilol 3.125 MG tablet Commonly known as: COREG Take 3.125 mg by mouth daily.   Eliquis 5 MG Tabs tablet Generic drug: apixaban Take 1 tablet (5 mg total) by mouth 2 (two) times daily. Needs appt Start taking on: August 23, 2020   empagliflozin 10 MG Tabs tablet Commonly known as: JARDIANCE Take 1 tablet (10 mg total) by mouth daily. Start taking on: August 23, 2020   Entresto 97-103 MG Generic drug: sacubitril-valsartan Take 1 tablet by mouth 2 (two) times daily.   esomeprazole 40 MG capsule Commonly known as: NEXIUM Take 40 mg by mouth daily. What changed: Another medication with the same name was removed. Continue taking this medication, and follow the directions you see here.   HYDROcodone-acetaminophen 5-325 MG tablet Commonly known as: NORCO/VICODIN Take 1-2 tablets by mouth every 6 (six) hours as needed for moderate pain.   metFORMIN 500 MG 24 hr tablet Commonly known as: GLUCOPHAGE-XR Take 1 tablet (500 mg total) by mouth daily with breakfast. Start taking on: August 23, 2020   oxymetazoline 0.05 % nasal spray Commonly known as: AFRIN Place 1 spray into both nostrils at bedtime.             Discharge Care Instructions  (From admission, onward)         Start     Ordered   08/22/20 0000  Discharge wound care:       Comments: See attached   08/22/20 1450          Follow-up Information    Carol Ada, MD.   Specialty: Gastroenterology Contact information: [place], [place] [address] [phone]        Surgery, [place]. Go on 09/13/2020.   Specialty: General Surgery Why: Follow up appointment scheduled for 8:30 AM. Please arrive 30 min prior to appointment time. Bring photo ID and insurance information.  Contact information: [address] [place] [address] [phone]        Christain Sacramento, MD Follow up in  1 week(s).   Specialty: Family Medicine Why: bmp Contact information: [place] [address] [phone]        Bensimhon, Shaune Pascal, MD .   Specialty: Cardiology Contact information: [address] [place] [address] [phone]        Deboraha Sprang, MD .   Specialty: Cardiology Contact information: [phone] [address] [place] [address] [phone]                Time coordinating discharge: 35 min  Signed:  Geradine Girt DO  [facility] 08/22/2020, 2:50 PM

## 2020-08-22 NOTE — Anesthesia Preprocedure Evaluation (Addendum)
Anesthesia Evaluation  Patient identified by MRN, date of birth, ID band Patient awake    Reviewed: Allergy & Precautions, NPO status , Patient's Chart, lab work & pertinent test results  History of Anesthesia Complications Negative for: history of anesthetic complications  Airway Mallampati: I  TM Distance: >3 FB Neck ROM: Full    Dental  (+) Dental Advisory Given, Teeth Intact   Pulmonary former smoker,    Pulmonary exam normal        Cardiovascular hypertension, Pt. on home beta blockers and Pt. on medications + CAD, + Past MI, + CABG, + Peripheral Vascular Disease and +CHF  Normal cardiovascular exam+ dysrhythmias Atrial Fibrillation + Cardiac Defibrillator    '20 TTE - Severe akinesis of the left ventricular, mid-apical inferior wall, apical segment, anteroseptal wall, anterolateral wall and inferolateral wall. EF 25-30%. Mildly  increased left ventricular wall thickness. Left ventricular diastolic Doppler parameters are consistent with impaired relaxation. Very thin laminated apical thrombus noted with definity. No significant valvulopathy    Neuro/Psych negative neurological ROS  negative psych ROS   GI/Hepatic Neg liver ROS, GERD  Medicated and Controlled,  Endo/Other  diabetes, Type 2, Oral Hypoglycemic Agents  Renal/GU negative Renal ROS    Bladder cancer     Musculoskeletal  (+) Arthritis ,   Abdominal   Peds  Hematology negative hematology ROS (+)  On eliquis    Anesthesia Other Findings Covid test negative   Reproductive/Obstetrics                            Anesthesia Physical Anesthesia Plan  ASA: IV  Anesthesia Plan: General   Post-op Pain Management:    Induction: Intravenous  PONV Risk Score and Plan: 2 and Treatment may vary due to age or medical condition, Ondansetron and Aprepitant  Airway Management Planned: Oral ETT  Additional Equipment:  None  Intra-op Plan:   Post-operative Plan: Extubation in OR  Informed Consent: I have reviewed the patients History and Physical, chart, labs and discussed the procedure including the risks, benefits and alternatives for the proposed anesthesia with the patient or authorized representative who has indicated his/her understanding and acceptance.     Dental advisory given  Plan Discussed with: CRNA and Anesthesiologist  Anesthesia Plan Comments:        Anesthesia Quick Evaluation

## 2020-08-22 NOTE — Discharge Instructions (Signed)
[facility] LAPAROSCOPIC SURGERY: POST OP INSTRUCTIONS  Always review your discharge instruction sheet given to you by the facility where your surgery was performed. IF YOU HAVE DISABILITY OR FAMILY LEAVE FORMS, YOU MUST BRING THEM TO THE OFFICE FOR PROCESSING.   DO NOT GIVE THEM TO YOUR DOCTOR.  1. A prescription for pain medication may be given to you upon discharge.  Take your pain medication as prescribed, if needed.  If narcotic pain medicine is not needed, then you may take acetaminophen (Tylenol), naprosyn (Alleve), or ibuprofen (Advil) as needed. 2. Take your usually prescribed medications unless otherwise directed. 3. If you need a refill on your pain medication, please contact your pharmacy.  They will contact our office to request authorization. Prescriptions will not be filled after 5pm or on week-ends. 4. You should follow a light diet the first few days after arrival home, such as soup and crackers, etc.  Be sure to include lots of fluids daily. 5. Most patients will experience some swelling and bruising in the area of the incisions.  Ice packs will help.  Swelling and bruising can take several days to resolve.  6. It is common to experience some constipation if taking pain medication after surgery.  Increasing fluid intake and taking a stool softener (such as Colace) will usually help or prevent this problem from occurring.  A mild laxative (Milk of Magnesia or Miralax) should be taken according to package instructions if there are no bowel movements after 48 hours. 7. Unless discharge instructions indicate otherwise, you may remove your bandages 48 hours after surgery, and you may shower at that time.  You may have steri-strips (small skin tapes) in place directly over the incision.  These strips should be left on the skin for 7-10 days.  If your surgeon used skin glue on the incision, you may shower in 24 hours.  The glue will flake  off over the next 2-3 weeks.  Any sutures or staples will be removed at the office during your follow-up visit. 8. ACTIVITIES:  You may resume regular (light) daily activities beginning the next day--such as daily self-care, walking, climbing stairs--gradually increasing activities as tolerated.  You may have sexual intercourse when it is comfortable.  Refrain from any heavy lifting or straining until approved by your doctor. a. You may drive when you are no longer taking prescription pain medication, you can comfortably wear a seatbelt, and you can safely maneuver your car and apply brakes. b. RETURN TO WORK:  __________________________________________________________ 9. You should see your doctor in the office for a follow-up appointment approximately 2-3 weeks after your surgery.  Make sure that you call for this appointment within a day or two after you arrive home to insure a convenient appointment time. 10. OTHER INSTRUCTIONS: __________________________________________________________________________________________________________________________ __________________________________________________________________________________________________________________________ WHEN TO CALL YOUR DOCTOR: 1. Fever over 101.0 2. Inability to urinate 3. Continued bleeding from incision. 4. Increased pain, redness, or drainage from the incision. 5. Increasing abdominal pain  The clinic staff is available to answer your questions during regular business hours.  Please don't hesitate to call and ask to speak to one of the nurses for clinical concerns.  If you have a medical emergency, go to the nearest emergency room or call 911.  A surgeon from [facility] is always on call at the hospital. [address], [address], [place], [place]  [address] ? [address] [address], [place], [place]   [address] [phone] ? [phone] ? [phone]   [phone] Web site: [email or web address]  

## 2020-08-23 ENCOUNTER — Encounter (HOSPITAL_COMMUNITY): Payer: Self-pay | Admitting: General Surgery

## 2020-08-23 LAB — SURGICAL PATHOLOGY

## 2020-08-23 NOTE — Anesthesia Postprocedure Evaluation (Signed)
Anesthesia Post Note  Patient: [name] [name]  Procedure(s) Performed: LAPAROSCOPIC CHOLECYSTECTOMY (N/A Abdomen)     Patient location during evaluation: PACU Anesthesia Type: General Level of consciousness: awake and alert Pain management: pain level controlled Vital Signs Assessment: post-procedure vital signs reviewed and stable Respiratory status: spontaneous breathing, nonlabored ventilation and respiratory function stable Cardiovascular status: blood pressure returned to baseline and stable Postop Assessment: no apparent nausea or vomiting Anesthetic complications: no   No complications documented.  Last Vitals:  Vitals:   08/22/20 1403 08/22/20 1424  BP: (!) 154/66 (!) 148/70  Pulse: 68 70  Resp: 10 16  Temp: 36.6 C 36.6 C  SpO2: 95% 91%    Last Pain:  Vitals:   08/22/20 1536  TempSrc:   PainSc: Todd

## 2020-09-03 ENCOUNTER — Other Ambulatory Visit (HOSPITAL_COMMUNITY): Payer: Self-pay | Admitting: Internal Medicine

## 2020-09-06 ENCOUNTER — Encounter (HOSPITAL_COMMUNITY): Payer: Self-pay

## 2020-09-06 ENCOUNTER — Ambulatory Visit (INDEPENDENT_AMBULATORY_CARE_PROVIDER_SITE_OTHER): Payer: Medicare Other

## 2020-09-06 ENCOUNTER — Other Ambulatory Visit (HOSPITAL_COMMUNITY): Payer: Self-pay | Admitting: *Deleted

## 2020-09-06 ENCOUNTER — Other Ambulatory Visit (HOSPITAL_COMMUNITY): Payer: Self-pay

## 2020-09-06 DIAGNOSIS — I255 Ischemic cardiomyopathy: Secondary | ICD-10-CM | POA: Diagnosis not present

## 2020-09-07 LAB — CUP PACEART REMOTE DEVICE CHECK
Battery Remaining Longevity: 105 mo
Battery Voltage: 2.95 V
Brady Statistic RV Percent Paced: 0.56 %
Date Time Interrogation Session: 20220511101302
HighPow Impedance: 80 Ohm
Implantable Lead Implant Date: 20190724
Implantable Lead Location: 753860
Implantable Pulse Generator Implant Date: 20190724
Lead Channel Impedance Value: 342 Ohm
Lead Channel Impedance Value: 399 Ohm
Lead Channel Pacing Threshold Amplitude: 0.75 V
Lead Channel Pacing Threshold Pulse Width: 0.4 ms
Lead Channel Sensing Intrinsic Amplitude: 14.125 mV
Lead Channel Sensing Intrinsic Amplitude: 14.125 mV
Lead Channel Setting Pacing Amplitude: 2.5 V
Lead Channel Setting Pacing Pulse Width: 0.4 ms
Lead Channel Setting Sensing Sensitivity: 0.3 mV

## 2020-09-29 NOTE — Progress Notes (Signed)
Remote ICD transmission.   

## 2020-11-16 ENCOUNTER — Encounter (HOSPITAL_COMMUNITY): Payer: Self-pay | Admitting: Internal Medicine

## 2020-11-16 ENCOUNTER — Other Ambulatory Visit: Payer: Self-pay

## 2020-11-16 ENCOUNTER — Ambulatory Visit (HOSPITAL_COMMUNITY)
Admit: 2020-11-16 | Discharge: 2020-11-16 | Disposition: A | Discharge status: Home / Self Care | Payer: Medicare Other | Attending: Internal Medicine | Admitting: Internal Medicine

## 2020-11-16 ENCOUNTER — Ambulatory Visit (HOSPITAL_BASED_OUTPATIENT_CLINIC_OR_DEPARTMENT_OTHER)
Admit: 2020-11-16 | Discharge: 2020-11-16 | Disposition: A | Discharge status: Home / Self Care | Payer: Medicare Other | Attending: Internal Medicine | Admitting: Internal Medicine

## 2020-11-16 VITALS — BP 142/70 | HR 62 | Wt 204.4 lb

## 2020-11-16 DIAGNOSIS — Z87891 Personal history of nicotine dependence: Secondary | ICD-10-CM | POA: Diagnosis not present

## 2020-11-16 DIAGNOSIS — E785 Hyperlipidemia, unspecified: Secondary | ICD-10-CM | POA: Insufficient documentation

## 2020-11-16 DIAGNOSIS — Z79899 Other long term (current) drug therapy: Secondary | ICD-10-CM | POA: Insufficient documentation

## 2020-11-16 DIAGNOSIS — Z95810 Presence of automatic (implantable) cardiac defibrillator: Secondary | ICD-10-CM

## 2020-11-16 DIAGNOSIS — Z951 Presence of aortocoronary bypass graft: Secondary | ICD-10-CM | POA: Insufficient documentation

## 2020-11-16 DIAGNOSIS — I130 Hypertensive heart and chronic kidney disease with heart failure and stage 1 through stage 4 chronic kidney disease, or unspecified chronic kidney disease: Secondary | ICD-10-CM | POA: Insufficient documentation

## 2020-11-16 DIAGNOSIS — E1122 Type 2 diabetes mellitus with diabetic chronic kidney disease: Secondary | ICD-10-CM | POA: Diagnosis not present

## 2020-11-16 DIAGNOSIS — I252 Old myocardial infarction: Secondary | ICD-10-CM | POA: Diagnosis not present

## 2020-11-16 DIAGNOSIS — I480 Paroxysmal atrial fibrillation: Secondary | ICD-10-CM | POA: Diagnosis not present

## 2020-11-16 DIAGNOSIS — Z7901 Long term (current) use of anticoagulants: Secondary | ICD-10-CM | POA: Insufficient documentation

## 2020-11-16 DIAGNOSIS — I5042 Chronic combined systolic (congestive) and diastolic (congestive) heart failure: Secondary | ICD-10-CM | POA: Diagnosis present

## 2020-11-16 DIAGNOSIS — I2510 Atherosclerotic heart disease of native coronary artery without angina pectoris: Secondary | ICD-10-CM | POA: Diagnosis not present

## 2020-11-16 DIAGNOSIS — Z8551 Personal history of malignant neoplasm of bladder: Secondary | ICD-10-CM | POA: Diagnosis not present

## 2020-11-16 DIAGNOSIS — I5022 Chronic systolic (congestive) heart failure: Secondary | ICD-10-CM | POA: Diagnosis not present

## 2020-11-16 DIAGNOSIS — N1832 Chronic kidney disease, stage 3b: Secondary | ICD-10-CM | POA: Diagnosis not present

## 2020-11-16 DIAGNOSIS — J449 Chronic obstructive pulmonary disease, unspecified: Secondary | ICD-10-CM | POA: Insufficient documentation

## 2020-11-16 DIAGNOSIS — I340 Nonrheumatic mitral (valve) insufficiency: Secondary | ICD-10-CM | POA: Insufficient documentation

## 2020-11-16 DIAGNOSIS — Z8249 Family history of ischemic heart disease and other diseases of the circulatory system: Secondary | ICD-10-CM | POA: Insufficient documentation

## 2020-11-16 DIAGNOSIS — E875 Hyperkalemia: Secondary | ICD-10-CM

## 2020-11-16 DIAGNOSIS — Z7984 Long term (current) use of oral hypoglycemic drugs: Secondary | ICD-10-CM | POA: Diagnosis not present

## 2020-11-16 LAB — ECHOCARDIOGRAM COMPLETE
Area-P 1/2: 3.53 cm2
S' Lateral: 4.3 cm

## 2020-11-16 MED ORDER — PERFLUTREN LIPID MICROSPHERE
1.0000 mL | INTRAVENOUS | Status: DC | PRN
  Administered 2020-11-16: 3 mL via INTRAVENOUS
  Filled 2020-11-16: qty 10

## 2020-11-16 NOTE — Patient Instructions (Addendum)
   No other medication changes were made. Please continue all current medications as prescribed.  Your physician recommends that you schedule a follow-up appointment in: 6 months. Call our office in December   If you have any questions or concerns before your next appointment please send [place] a message through [place] or call our office at [phone].    TO LEAVE A MESSAGE FOR THE NURSE SELECT OPTION 2, PLEASE LEAVE A MESSAGE INCLUDING: YOUR NAME DATE OF BIRTH CALL BACK NUMBER REASON FOR CALL**this is important as we prioritize the call backs  YOU WILL RECEIVE A CALL BACK THE SAME DAY AS LONG AS YOU CALL BEFORE 4:00 PM   Do the following things EVERYDAY: Weigh yourself in the morning before breakfast. Write it down and keep it in a log. Take your medicines as prescribed Eat low salt foods--Limit salt (sodium) to 2000 mg per day.  Stay as active as you can everyday Limit all fluids for the day to less than 2 liters   At the [facility], you and your health needs are our priority. As part of our continuing mission to provide you with exceptional heart care, we have created designated Provider Care Teams. These Care Teams include your primary Cardiologist (physician) and Advanced Practice Providers (APPs- Physician Assistants and Nurse Practitioners) who all work together to provide you with the care you need, when you need it.   You may see any of the following providers on your designated Care Team at your next follow up: Dr Glori Bickers Dr Haynes Kerns, NP Lyda Jester, [place] Audry Riles, PharmD   Please be sure to bring in all your medications bottles to every appointment.

## 2020-11-16 NOTE — Addendum Note (Signed)
Encounter addended by: Stanford Scotland, RN on: 11/16/2020 3:08 PM  Actions taken: Clinical Note Signed

## 2020-11-16 NOTE — Progress Notes (Signed)
Advanced Heart Failure Clinic Note   PCP: Christain Sacramento, MD Primary Cardiologist: Dr. Haroldine Laws   HPI:  [name] [name] is a 75 y.o. 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   Pt admitted in 12/18 with anterior STEMI and developed cardiogenic shock requiring Impella support. Urgent cath showed severe 3v CAD as below. While on impella, pt developed sepsis with WBC ~ 40K, UCx + pseudomonas. Treated for AECOPD and completed ABX therapy. BCx negative. Stabilized and underwent CABG x 2 with LIMA to LAD and SVG to OM on 04/08/2017. Pt transiently required milrinone but weaned prior to discharge. Required amiodarone for brief post op afib, but had no recurrence.   Echo 08/07/17 EF 35-40% laminated apical clot. (Dr.Nashly Olsson felt ~ 25-30%) and moderate to severe MR.   Cardiac MRI 09/2017 EF 30%, laminated apical thrombus, normal RV size and function, LGE pattern suggests infarction in LAD territory, unlikely to improve with revascularization  S/p Medtronic ICD with Dr Caryl Comes 11/20/17.  Saw Dr. Caryl Comes on 01/07/20 and was in AF with RVR. Potassium also high so Inspra stopped. Dr. Olin Pia note also mentions chronotropic incompetence with exercise. Carvedilol cut back to 3.125 bid.   Feeling good, no issues since last visit.  [facility]  Lifts weights, uses the exercise bikes.  No chest pain, sleeping well no orthopnea or PND.    Echo today 11/16/20 EF 20-25% RV mildly reduced No LV clot Personally reviewed   Echo 8/20 EF 25-30% RV ok. Small apical thrombus.   Review of systems complete and found to be negative unless listed in HPI.   [facility] 03/31/2017 Prox RCA lesion is 100% stenosed. Dist RCA lesion is 100% stenosed (retrograde filling from collaterals does not go beyond the distal  vessel) -->faint collateral filling to the distal PL and PDA from septal perforators and a diffusely diseased AV groove circumflex LAV Groove lesion is 90% stenosed. Prox LAD-1 lesion is 95% stenosed. Prox LAD-2 lesion is 85% stenosed. Heavily calcified, tandem lesions in very tortuous segment of the vessel. Prox Cx to Mid Cx lesion is 70% stenosed. Heavily calcified There is severe left ventricular systolic dysfunction. The left ventricular ejection fraction is less than 25% by visual estimate. LV end diastolic pressure is severely elevated -prior to Impella insertion Hemodynamic findings consistent with moderate pulmonary hypertension. Successful Impella insertion with 3.5-3.6 LPM flow's  Echo 04/01/2017 EF 15-20%  Echo 04/16/17 (post op) Improved to 25-30% on 04/16/17 Echo 08/07/17: EF 35-40%, mod to severe MR  Past Medical History:  Diagnosis Date   AICD (automatic cardioverter/defibrillator) present    Arthritis    Bladder cancer (HCC)    Borderline glaucoma    Chronic systolic (congestive) heart failure (HCC)    Coronary artery disease    Diabetes mellitus type 2, controlled ([place]) [facility]AL [facility]   Essential hypertension    Heart murmur    Hepatitis    Medical history non-contributory    Paroxysmal A-fib (HCC)    Paroxysmal A-fib ([place]) 08/20/2020   Peripheral vascular disease (HCC)    Pneumonia     Current Outpatient Medications  Medication Sig Dispense Refill   apixaban (ELIQUIS) 5 MG TABS tablet Take 1 tablet (5 mg total) by mouth 2 (two) times daily. Needs  appt 180 tablet 3   atorvastatin (LIPITOR) 80 MG tablet Take 1 tablet (80 mg total) by mouth daily. 90 tablet 1   carvedilol (COREG) 3.125 MG tablet Take 3.125 mg by mouth daily.     empagliflozin (JARDIANCE) 10 MG TABS tablet Take 1 tablet (10 mg total) by mouth daily. 90 tablet 3   metFORMIN (GLUCOPHAGE-XR) 500 MG 24 hr tablet Take 1 tablet (500 mg total) by mouth daily with breakfast.     sacubitril-valsartan (ENTRESTO)  97-103 MG Take 1 tablet by mouth 2 (two) times daily. 180 tablet 3   No current facility-administered medications for this encounter.   Facility-Administered Medications Ordered in Other Encounters  Medication Dose Route Frequency Provider Last Rate Last Admin   perflutren lipid microspheres (DEFINITY) IV suspension  1-10 mL Intravenous PRN Oluwanifemi Susman, Shaune Pascal, MD   3 mL at 11/16/20 1350    Allergies  Allergen Reactions   Spironolactone Other (See Comments)    Painful gynecomastia   Niacin And Related       Social History   Socioeconomic History   Marital status: Married    Spouse name: Not on file   Number of children: Not on file   Years of education: Not on file   Highest education level: Not on file  Occupational History   Occupation: retired  Tobacco Use   Smoking status: Former    Packs/day: 1.00    Years: 40.00    Pack years: 40.00    Types: Cigarettes    Quit date: 03/31/2017    Years since quitting: 3.6   Smokeless tobacco: Never  Vaping Use   Vaping Use: Never used  Substance and Sexual Activity   Alcohol use: Not Currently    Comment: rarely   Drug use: No   Sexual activity: Not on file  Other Topics Concern   Not on file  Social History Narrative   Not on file   Social Determinants of Health   Financial Resource Strain: Not on file  Food Insecurity: Not on file  Transportation Needs: Not on file  Physical Activity: Not on file  Stress: Not on file  Social Connections: Not on file  Intimate Partner Violence: Not on file   Family History  Problem Relation Age of Onset   Heart attack Sister    Heart disease Sister     Vitals:   11/16/20 1408  BP: (!) 142/70  Pulse: 62  SpO2: 98%  Weight: 92.7 kg (204 lb 6.4 oz)   Wt Readings from Last 3 Encounters:  11/16/20 92.7 kg (204 lb 6.4 oz)  08/21/20 94.3 kg (207 lb 14.3 oz)  07/04/20 95.3 kg (210 lb)    PHYSICAL EXAM: General:  Well appearing. No resp difficulty HEENT: normal Neck:  supple. no JVD. Carotids 2+ bilat; no bruits. No lymphadenopathy or thryomegaly appreciated. Cor: PMI nondisplaced. Regular rate & rhythm. No rubs, gallops or murmurs. Lungs: clear with diminished BS throughout Abdomen: soft, nontender, nondistended. No hepatosplenomegaly. No bruits or masses. Good bowel sounds. Extremities: no cyanosis, clubbing, rash, edema Neuro: alert & orientedx3, cranial nerves grossly intact. moves all 4 extremities w/o difficulty. Affect pleasant  ASSESSMENT & PLAN:  1. Chronic systolic CHF due to ICM  - EF 04/01/17 EF 15-20% -> Improved to 25-30% on 04/16/17 Post op CABG as below.  - Echo 08/07/17 read as EF 35-40% but Dr Haroldine Laws reviewed personally and EF 25-30% (reviewed with Dr. Meda Coffee who agrees) + laminated apical clot and moderate to severe  MR. RV ok.  - Cardiac MRI 09/2017 EF 30%, laminated apical thrombus, normal RV size and function, LGE pattern suggests infarction in LAD territory, unlikely to improve with revascularization - Echo 8/20 EF 25-30% RV ok. Mild MR Small apical thrombus.  - Echo today 11/16/20 EF 20-25% RV mildly reduced No LV clot Personally reviewed - S/p Medtronic ICD with Dr Caryl Comes 11/20/17 - Stable NYHA II  - Volume status stable. Not on lasix  - Continue carvedilol 3.125 bid. (could not tolerate higher dosing due to severe chronotropic incompetence with exercise HR in 80s) - Off eplerenone due hyperkalemia(painful gynecomastia with spiro) - Continue Entresto 97/103 mg BID.  - Continue Jardiance - ICD interrogated in clinic. No AF/VT. Volume good. Activity 2hr/day. HR variability improved.  - Labs today  2. Mitral regurgitation - moderate to severe on echo 07/2017 - mild on recent echo   3. CAD - s/p CABG with LIMA to LAD and SVG to OM on 04/08/2017 - No s/s of ischemia - Continue statin, BB. No ASA with Eliquis use.   4. Tobacco abuse: Smoked 1 ppd PTA for > 50 years.  - Remains quit. No change.    5. Hx of bladder CA:  - s/p  Multiple rounds of BCG therapy.    6. Paroxysmal Atrial fibrillation - Regular on exam. Off amio.  - Apparently had AF when he saw Dr. Caryl Comes previously but none on device interrogation today - Continue Eliquis. Denies bleeding.   7. LV clot - Laminated, small - On Eliquis for PAF. No change.   8. DM2 - On Jardiance  9. CKD 3b - check labs today - continue Entresto and SGLT2i    Katherine Roan, MD 11/16/20   Patient seen and examined with the above-signed Advanced Practice Provider and/or Housestaff. I personally reviewed laboratory data, imaging studies and relevant notes. I independently examined the patient and formulated the important aspects of the plan. I have edited the note to reflect any of my changes or salient points. I have personally discussed the plan with the patient and/or family.  Doing very well. NYHA II. Volume status looks good. On good GDMT. Has not tolerated MRA due to hyperkalemia.   Echo today 11/16/20 EF 20-25% RV mildly reduced No LV clot Personally reviewed  ID interrogated in clinic No VT/AF. Activity level 1.5 hr/day Personally reviewed  General:  Well appearing. No resp difficulty HEENT: normal Neck: supple. no JVD. Carotids 2+ bilat; no bruits. No lymphadenopathy or thryomegaly appreciated. Cor: PMI nondisplaced. Regular rate & rhythm. No rubs, gallops or murmurs. Lungs: clear Abdomen: soft, nontender, nondistended. No hepatosplenomegaly. No bruits or masses. Good bowel sounds. Extremities: no cyanosis, clubbing, rash, edema Neuro: alert & orientedx3, cranial nerves grossly intact. moves all 4 extremities w/o difficulty. Affect pleasant  Doing well continue current regimen. Echo images reviewed with him. Check labs tomorrow with Dr. Redmond Pulling,   Glori Bickers, MD  3:03 PM

## 2020-11-17 ENCOUNTER — Other Ambulatory Visit (HOSPITAL_COMMUNITY): Payer: Self-pay | Admitting: Internal Medicine

## 2020-11-17 ENCOUNTER — Other Ambulatory Visit (HOSPITAL_BASED_OUTPATIENT_CLINIC_OR_DEPARTMENT_OTHER): Payer: Self-pay | Admitting: Family Medicine

## 2020-11-17 DIAGNOSIS — R0989 Other specified symptoms and signs involving the circulatory and respiratory systems: Secondary | ICD-10-CM

## 2020-11-18 ENCOUNTER — Ambulatory Visit (HOSPITAL_BASED_OUTPATIENT_CLINIC_OR_DEPARTMENT_OTHER)
Admission: RE | Admit: 2020-11-18 | Discharge: 2020-11-18 | Disposition: A | Discharge status: Home / Self Care | Payer: Medicare Other | Source: Ambulatory Visit | Attending: Family Medicine | Admitting: Family Medicine

## 2020-11-18 ENCOUNTER — Other Ambulatory Visit: Payer: Self-pay

## 2020-11-18 DIAGNOSIS — R0989 Other specified symptoms and signs involving the circulatory and respiratory systems: Secondary | ICD-10-CM | POA: Insufficient documentation

## 2020-12-06 ENCOUNTER — Ambulatory Visit (INDEPENDENT_AMBULATORY_CARE_PROVIDER_SITE_OTHER): Payer: Medicare Other

## 2020-12-06 DIAGNOSIS — I255 Ischemic cardiomyopathy: Secondary | ICD-10-CM | POA: Diagnosis not present

## 2020-12-06 LAB — CUP PACEART REMOTE DEVICE CHECK
Battery Remaining Longevity: 100 mo
Battery Voltage: 3 V
Brady Statistic RV Percent Paced: 2.05 %
Date Time Interrogation Session: 20220809001703
HighPow Impedance: 73 Ohm
Implantable Lead Implant Date: 20190724
Implantable Lead Location: 753860
Implantable Pulse Generator Implant Date: 20190724
Lead Channel Impedance Value: 323 Ohm
Lead Channel Impedance Value: 380 Ohm
Lead Channel Pacing Threshold Amplitude: 0.75 V
Lead Channel Pacing Threshold Pulse Width: 0.4 ms
Lead Channel Sensing Intrinsic Amplitude: 13.375 mV
Lead Channel Sensing Intrinsic Amplitude: 13.375 mV
Lead Channel Setting Pacing Amplitude: 2.5 V
Lead Channel Setting Pacing Pulse Width: 0.4 ms
Lead Channel Setting Sensing Sensitivity: 0.3 mV

## 2020-12-15 ENCOUNTER — Telehealth (HOSPITAL_COMMUNITY): Payer: Self-pay | Admitting: *Deleted

## 2020-12-15 NOTE — Telephone Encounter (Signed)
Dentist office left VM asking if it was ok to perform root canal on pt. Also asked if pt needs to hold eliquis.   Routed to [place] for advice  Call back # [phone]

## 2020-12-21 NOTE — Telephone Encounter (Signed)
Left detailed vm °

## 2020-12-29 NOTE — Progress Notes (Signed)
Remote ICD transmission.   

## 2021-01-01 ENCOUNTER — Other Ambulatory Visit (HOSPITAL_COMMUNITY): Payer: Self-pay | Admitting: Internal Medicine

## 2021-03-07 ENCOUNTER — Ambulatory Visit (INDEPENDENT_AMBULATORY_CARE_PROVIDER_SITE_OTHER): Payer: Medicare Other

## 2021-03-07 DIAGNOSIS — I255 Ischemic cardiomyopathy: Secondary | ICD-10-CM

## 2021-03-08 LAB — CUP PACEART REMOTE DEVICE CHECK
Battery Remaining Longevity: 98 mo
Battery Voltage: 2.99 V
Brady Statistic RV Percent Paced: 1.22 %
Date Time Interrogation Session: 20221109012306
HighPow Impedance: 77 Ohm
Implantable Lead Implant Date: 20190724
Implantable Lead Location: 753860
Implantable Pulse Generator Implant Date: 20190724
Lead Channel Impedance Value: 323 Ohm
Lead Channel Impedance Value: 342 Ohm
Lead Channel Pacing Threshold Amplitude: 0.625 V
Lead Channel Pacing Threshold Pulse Width: 0.4 ms
Lead Channel Sensing Intrinsic Amplitude: 14.75 mV
Lead Channel Sensing Intrinsic Amplitude: 14.75 mV
Lead Channel Setting Pacing Amplitude: 2.5 V
Lead Channel Setting Pacing Pulse Width: 0.4 ms
Lead Channel Setting Sensing Sensitivity: 0.3 mV

## 2021-03-15 NOTE — Progress Notes (Signed)
Remote ICD transmission.   

## 2021-05-09 ENCOUNTER — Encounter (HOSPITAL_COMMUNITY): Payer: Self-pay | Admitting: Internal Medicine

## 2021-06-06 ENCOUNTER — Ambulatory Visit (INDEPENDENT_AMBULATORY_CARE_PROVIDER_SITE_OTHER): Payer: Medicare Other

## 2021-06-06 DIAGNOSIS — I255 Ischemic cardiomyopathy: Secondary | ICD-10-CM | POA: Diagnosis not present

## 2021-06-06 LAB — CUP PACEART REMOTE DEVICE CHECK
Battery Remaining Longevity: 92 mo
Battery Voltage: 2.99 V
Brady Statistic RV Percent Paced: 1.06 %
Date Time Interrogation Session: 20230207042410
HighPow Impedance: 66 Ohm
Implantable Lead Implant Date: 20190724
Implantable Lead Location: 753860
Implantable Pulse Generator Implant Date: 20190724
Lead Channel Impedance Value: 285 Ohm
Lead Channel Impedance Value: 380 Ohm
Lead Channel Pacing Threshold Amplitude: 0.75 V
Lead Channel Pacing Threshold Pulse Width: 0.4 ms
Lead Channel Sensing Intrinsic Amplitude: 14 mV
Lead Channel Sensing Intrinsic Amplitude: 14 mV
Lead Channel Setting Pacing Amplitude: 2.5 V
Lead Channel Setting Pacing Pulse Width: 0.4 ms
Lead Channel Setting Sensing Sensitivity: 0.3 mV

## 2021-06-09 NOTE — Progress Notes (Signed)
Remote ICD transmission.   

## 2021-07-16 ENCOUNTER — Other Ambulatory Visit (HOSPITAL_COMMUNITY): Payer: Self-pay | Admitting: Internal Medicine

## 2021-07-17 ENCOUNTER — Other Ambulatory Visit (HOSPITAL_COMMUNITY): Payer: Self-pay | Admitting: Internal Medicine

## 2021-08-01 ENCOUNTER — Emergency Department (HOSPITAL_COMMUNITY): Payer: Medicare Other

## 2021-08-01 ENCOUNTER — Emergency Department (HOSPITAL_COMMUNITY)
Admission: EM | Admit: 2021-08-01 | Discharge: 2021-08-01 | Disposition: A | Discharge status: Home / Self Care | Payer: Medicare Other | Attending: Emergency Medicine | Admitting: Emergency Medicine

## 2021-08-01 ENCOUNTER — Encounter (HOSPITAL_COMMUNITY): Payer: Self-pay

## 2021-08-01 ENCOUNTER — Other Ambulatory Visit: Payer: Self-pay

## 2021-08-01 DIAGNOSIS — Z7901 Long term (current) use of anticoagulants: Secondary | ICD-10-CM | POA: Diagnosis not present

## 2021-08-01 DIAGNOSIS — I4892 Unspecified atrial flutter: Secondary | ICD-10-CM | POA: Diagnosis not present

## 2021-08-01 DIAGNOSIS — E119 Type 2 diabetes mellitus without complications: Secondary | ICD-10-CM | POA: Insufficient documentation

## 2021-08-01 DIAGNOSIS — I2510 Atherosclerotic heart disease of native coronary artery without angina pectoris: Secondary | ICD-10-CM | POA: Insufficient documentation

## 2021-08-01 DIAGNOSIS — I509 Heart failure, unspecified: Secondary | ICD-10-CM | POA: Diagnosis not present

## 2021-08-01 DIAGNOSIS — Z859 Personal history of malignant neoplasm, unspecified: Secondary | ICD-10-CM | POA: Diagnosis not present

## 2021-08-01 DIAGNOSIS — R0602 Shortness of breath: Secondary | ICD-10-CM | POA: Diagnosis present

## 2021-08-01 DIAGNOSIS — Z7984 Long term (current) use of oral hypoglycemic drugs: Secondary | ICD-10-CM | POA: Insufficient documentation

## 2021-08-01 DIAGNOSIS — I110 Hypertensive heart disease with heart failure: Secondary | ICD-10-CM | POA: Insufficient documentation

## 2021-08-01 LAB — BASIC METABOLIC PANEL
Anion gap: 7 (ref 5–15)
BUN: 23 mg/dL (ref 8–23)
CO2: 23 mmol/L (ref 22–32)
Calcium: 9.1 mg/dL (ref 8.9–10.3)
Chloride: 110 mmol/L (ref 98–111)
Creatinine, Ser: 1.88 mg/dL — ABNORMAL HIGH (ref 0.61–1.24)
GFR, Estimated: 37 mL/min — ABNORMAL LOW (ref >60)
Glucose, Bld: 137 mg/dL — ABNORMAL HIGH (ref 70–99)
Potassium: 5 mmol/L (ref 3.5–5.1)
Sodium: 140 mmol/L (ref 135–145)

## 2021-08-01 LAB — CBC
HCT: 44.2 % (ref 39.0–52.0)
Hemoglobin: 14.1 g/dL (ref 13.0–17.0)
MCH: 29.4 pg (ref 26.0–34.0)
MCHC: 31.9 g/dL (ref 30.0–36.0)
MCV: 92.3 fL (ref 80.0–100.0)
Platelets: 212 10*3/uL (ref 150–400)
RBC: 4.79 MIL/uL (ref 4.22–5.81)
RDW: 14.3 % (ref 11.5–15.5)
WBC: 9.1 10*3/uL (ref 4.0–10.5)
nRBC: 0 % (ref 0.0–0.2)

## 2021-08-01 MED ORDER — FENTANYL CITRATE PF 50 MCG/ML IJ SOSY: 25.0000 ug | PREFILLED_SYRINGE | Freq: Once | INTRAMUSCULAR | Status: DC

## 2021-08-01 MED ORDER — ETOMIDATE 2 MG/ML IV SOLN
9.0000 mg | Freq: Once | INTRAVENOUS | Status: AC
  Administered 2021-08-01: 9 mg via INTRAVENOUS
  Filled 2021-08-01: qty 10

## 2021-08-01 NOTE — ED Provider Notes (Signed)
?[facility] ?Provider Note ? ? ?CSN: [id number] ?Arrival date & time: 08/01/21  1238 ? ?  ? ?History ? ?Chief Complaint  ?Patient presents with  ? Shortness of Breath  ? Pneumonia  ? Fatigue  ? ? ?[name] [name] is a 76 y.o. male. ? ?Patient is 76 year old male with history notable for coronary artery disease, hypertension, diabetes, cancer and congestive heart failure.  He presents with tachycardia and EKG findings concerning for atrial flutter with RVR.  Reports also shortness of breath that began 6 days ago.  He reports that he initially thought that he was coming down [place] as he was feeling very fatigued.  He reports that his shortness of breath is normally associated with physical exertion.  He reports having decreased ability to walk 10-15 feet due to shortness of breath.  He reports a chronic cough and has not noticed any change with this since onset of his symptoms.  He also reports palpitations.  Patient reports regular adherence to his anticoagulation regimen in addition to regular adherence to his heart failure medications.  He denies any chest pain, pain with breathing, wheezing, lower extremity swelling, dysuria, sore throat, headache, myalgias. ? ? ?  ? ?Home Medications ?Prior to Admission medications   ?Medication Sig Start Date End Date Taking? Authorizing Provider  ?atorvastatin (LIPITOR) 80 MG tablet Take 1 tablet (80 mg total) by mouth daily. NEEDS FOLLOW UP APPOINTMENT FOR ANYMORE REFILLS 07/17/21   Bensimhon, Shaune Pascal, MD  ?carvedilol (COREG) 3.125 MG tablet Take 1 tablet (3.125 mg total) by mouth 2 (two) times daily. NEEDS FOLLOW UP APPOINTMENT FOR ANYMORE REFILLS 07/17/21   Bensimhon, Shaune Pascal, MD  ?ELIQUIS 5 MG TABS tablet TAKE 1 TABLET TWICE A DAY 11/17/20   Bensimhon, Shaune Pascal, MD  ?empagliflozin (JARDIANCE) 10 MG TABS tablet Take 1 tablet (10 mg total) by mouth daily. 08/23/20   Geradine Girt, DO  ?metFORMIN (GLUCOPHAGE-XR) 500 MG 24 hr tablet Take 1  tablet (500 mg total) by mouth daily with breakfast. 08/23/20   Geradine Girt, DO  ?sacubitril-valsartan (ENTRESTO) 97-103 MG Take 1 tablet by mouth 2 (two) times daily. NEEDS FOLLOW UP APPOINTMENT FOR ANYMORE REFILLS 07/17/21   Bensimhon, Shaune Pascal, MD  ?   ? ?Allergies    ?Spironolactone and Niacin and related   ? ?Review of Systems   ?Review of Systems  ?Constitutional:  Positive for fatigue. Negative for chills and fever.  ?HENT:  Negative for congestion and sore throat.   ?Eyes:  Negative for pain.  ?Respiratory:  Positive for shortness of breath. Negative for cough and wheezing.   ?Cardiovascular:  Positive for palpitations. Negative for chest pain and leg swelling.  ?Gastrointestinal:  Negative for abdominal pain and vomiting.  ?Genitourinary:  Negative for dysuria.  ?Skin:  Negative for rash.  ?Neurological:  Negative for dizziness, light-headedness and headaches.  ?Psychiatric/Behavioral:  Negative for confusion.   ? ?Physical Exam ?Updated Vital Signs ?BP (!) 140/96   Pulse 86   Temp 97.7 ?F (36.5 ?C) (Oral)   Resp 10   SpO2 99%  ?Physical Exam ?Constitutional:   ?   General: He is not in acute distress. ?   Appearance: He is well-developed. He is not ill-appearing or toxic-appearing.  ?HENT:  ?   Mouth/Throat:  ?   Mouth: Mucous membranes are moist.  ?   Pharynx: No pharyngeal swelling.  ?Cardiovascular:  ?   Rate and Rhythm: Regular rhythm. Tachycardia present.  ?  Pulses: Normal pulses.  ?Pulmonary:  ?   Effort: Pulmonary effort is normal. No tachypnea or respiratory distress.  ?   Breath sounds: Normal breath sounds. No decreased breath sounds, wheezing or rhonchi.  ?   Comments: Fine crackles at right lung base  ?Abdominal:  ?   General: Bowel sounds are normal.  ?   Palpations: Abdomen is soft.  ?Musculoskeletal:  ?   Cervical back: Normal range of motion.  ?   Right lower leg: No tenderness. Edema present.  ?   Left lower leg: No tenderness. No edema.  ?   Comments: Trace edema on RLE   ?Skin: ?    General: Skin is warm and dry.  ?   Capillary Refill: Capillary refill takes less than 2 seconds.  ?Neurological:  ?   General: No focal deficit present.  ?   Mental Status: He is alert and oriented to person, place, and time.  ? ? ?ED Results / Procedures / Treatments   ?Labs ?(all labs ordered are listed, but only abnormal results are displayed) ?Labs Reviewed  ?BASIC METABOLIC PANEL - Abnormal; Notable for the following components:  ?    Result Value  ? Glucose, Bld 137 (*)   ? Creatinine, Ser 1.88 (*)   ? GFR, Estimated 37 (*)   ? All other components within normal limits  ?CBC  ? ? ?EKG ?EKG Interpretation ? ?Date/Time:  Tuesday August 01 2021 14:49:54 EDT ?Ventricular Rate:  87 ?PR Interval:  203 ?QRS Duration: 103 ?QT Interval:  354 ?QTC Calculation: 426 ?R Axis:   92 ?Text Interpretation: Sinus rhythm Biatrial enlargement Anterior infarct, old Nonspecific T abnormalities, lateral leads  Since prior ECG, he is now in a normal sinus rhythm Confirmed by Gareth Morgan [phone]) on 08/01/2021 2:57:49 PM ? ?Radiology ?DG Chest Portable 1 View ? ?Result Date: 08/01/2021 ?CLINICAL DATA:  Shortness of breath for 1 day, history CHF, former smoker, atrial flutter EXAM: PORTABLE CHEST 1 VIEW COMPARISON:  Portable exam 1333 hours compared to 11/21/2017 FINDINGS: LEFT subclavian ICD with lead projecting over RIGHT ventricle. Enlargement of cardiac silhouette post median sternotomy and CABG. Mediastinal contours and pulmonary vascularity normal. Atherosclerotic calcification aorta. Lungs clear. No pulmonary infiltrate, pleural effusion, or pneumothorax. No acute osseous findings. IMPRESSION: Enlargement of cardiac silhouette post CABG and ICD. No acute abnormalities. Aortic Atherosclerosis (ICD10-I70.0). Electronically Signed   By: Lavonia Dana M.D.   On: 08/01/2021 13:42   ? ?Procedures ?Procedures  ? ? ?Medications Ordered in ED ?Medications  ?fentaNYL (SUBLIMAZE) injection 25 mcg (has no administration in time range)   ?etomidate (AMIDATE) injection 9 mg (9 mg Intravenous Given 08/01/21 1443)  ? ? ?ED Course/ Medical Decision Making/ A&P ?  ?                        ?Medical Decision Making ?Patient is a 76 year old male with hx of AF on AC (Eliquis), systolic HF presenting with SOB and EKG findings consistent with atrial flutter. Considered etiology for SOB related to acute HF exacerbation but less likely given absence of LE edema or crackles on PE, also considered less likely diagnoses of PE (normal RR and oxygenation) and PNA (absence of cough, fever, sputum production, CXR ordered). Discussed with cardiology with confirmation of atrial flutter, negative for STEMI with recommendation for cardioversion. Patient VS notable for HR ranging in 120s and HTN. Discussed medical management for rate control vs cardioversion and patient is agreeable to  cardioversion as recommended. HR elevated to 120s.  ?Patient underwent successful cardioversion with recent rate of 86.  ? ? ?Amount and/or Complexity of Data Reviewed ?Labs: ordered. ?Radiology: ordered. ? ?Risk ?Prescription drug management. ? ? ? ?Final Clinical Impression(s) / ED Diagnoses ?Final diagnoses:  ?Atrial flutter with rapid ventricular response ([place])  ? ? ?Rx / DC Orders ?ED Discharge Orders   ? ? None  ? ?  ? ? ?  ?Eulis Foster, MD ?08/01/21 1526 ? ?  ?Gareth Morgan, MD ?08/01/21 2142 ? ?

## 2021-08-01 NOTE — ED Triage Notes (Addendum)
EMS stated, sent here by Dr's office for Atrial flutter, thinks he might have pneumonia per pt. He has had SOB with exertion and takes a blood thinner not sure which ones.  ?

## 2021-08-01 NOTE — ED Notes (Signed)
Discharge instructions reviewed with patient. Patient verbalized understanding of instructions. Follow-up care and medications were reviewed. Patient ambulatory with steady gait. VSS upon discharge.  ?

## 2021-08-01 NOTE — ED Provider Notes (Signed)
?Physical Exam  ?BP (!) 153/96   Pulse 94   Temp 98 ?F (36.7 ?C)   Resp 17   SpO2 97%  ? ?Physical Exam ? ?Procedures  ?[name] KitchenCritical Care ?Performed by: Gareth Morgan, MD ?Authorized by: Gareth Morgan, MD  ? ?Critical care provider statement:  ?  Critical care time (minutes):  30 ?  Critical care was time spent personally by me on the following activities:  Development of treatment plan with patient or surrogate, discussions with consultants, evaluation of patient's response to treatment, examination of patient, ordering and review of laboratory studies, ordering and review of radiographic studies, ordering and performing treatments and interventions, pulse oximetry, re-evaluation of patient's condition and review of old charts ?.Sedation ? ?Date/Time: 08/01/2021 9:31 PM ?Performed by: Gareth Morgan, MD ?Authorized by: Gareth Morgan, MD  ? ?Consent:  ?  Consent obtained:  Verbal ?  Consent given by:  Patient ?  Risks discussed:  Allergic reaction, dysrhythmia, inadequate sedation, nausea, prolonged hypoxia resulting in organ damage, respiratory compromise necessitating ventilatory assistance and intubation and vomiting ?Universal protocol:  ?  Procedure explained and questions answered to patient or proxy's satisfaction: yes   ?  Relevant documents present and verified: yes   ?  Test results available: yes   ?  Imaging studies available: yes   ?  Required blood products, implants, devices, and special equipment available: yes   ?  Site/side marked: yes   ?  Immediately prior to procedure, a time out was called: yes   ?  Patient identity confirmed:  Verbally with patient ?Indications:  ?  Procedure performed:  Cardioversion ?  Procedure necessitating sedation performed by:  Physician performing sedation ?Pre-sedation assessment:  ?  Time since last food or drink:  7 ?  ASA classification: class 2 - patient with mild systemic disease   ?  Mouth opening:  3 or more finger widths ?  Thyromental distance:  4  finger widths ?  Mallampati score:  II - soft palate, uvula, fauces visible ?  Neck mobility: normal   ?  Pre-sedation assessments completed and reviewed: airway patency, cardiovascular function, hydration status, mental status and nausea/vomiting   ?Immediate pre-procedure details:  ?  Reassessment: Patient reassessed immediately prior to procedure   ?  Reviewed: vital signs, relevant labs/tests and NPO status   ?  Verified: bag valve mask available, emergency equipment available, intubation equipment available and IV patency confirmed   ?Procedure details (see MAR for exact dosages):  ?  Preoxygenation:  Nasal cannula ?  Sedation:  Etomidate ?  Intended level of sedation: deep ?  Intra-procedure monitoring:  Blood pressure monitoring, cardiac monitor, continuous pulse oximetry, continuous capnometry, frequent LOC assessments and frequent vital sign checks ?  Intra-procedure events: none   ?  Total Provider sedation time (minutes):  15 ?Post-procedure details:  ?  Attendance: Constant attendance by certified staff until patient recovered   ?  Recovery: Patient returned to pre-procedure baseline   ?  Patient is stable for discharge or admission: yes   ?  Procedure completion:  Tolerated well, no immediate complications ?Comments:  ?   Etomidate '9mg'$  ?.Cardioversion ? ?Date/Time: 08/01/2021 9:39 PM ?Performed by: Gareth Morgan, MD ?Authorized by: Gareth Morgan, MD  ? ?Consent:  ?  Consent obtained:  Written ?  Consent given by:  Patient ?  Risks discussed:  Cutaneous burn, death, induced arrhythmia and pain ?  Alternatives discussed:  Rate-control medication ?Pre-procedure details:  ?  Cardioversion basis:  Emergent ?  Rhythm:  Atrial flutter ?Attempt one:  ?  Cardioversion mode:  Synchronous ?  Shock (Joules):  120 ?  Shock outcome:  Conversion to normal sinus rhythm ?Post-procedure details:  ?  Patient status:  Awake ?  Patient tolerance of procedure:  Tolerated well, no immediate complications ? ?ED Course /  MDM  ?  ? ?76 y.o. male with h/o DM2, COPD (quit smoking 12/18), severe 3v CAD s/p CABG 69/50/7225, chronic systolic HF due to ICM, EF 25-30% (echo 12/18),  HTN, DM2, h/o bladder CA s/p multiple rounds of BCG therapy, and paroxysmal AF on eliquis presents with palpitations, dyspnea.   ? ?Cardioverted as above to sinus rhythm. Monitored for stability without change. Discussed with Cardiology, will continue same medication regimen until follow up.  This was first episode of atrial flutter in several years.   ? ? ?  ?Gareth Morgan, MD ?08/01/21 2141 ? ?

## 2021-08-01 NOTE — ED Triage Notes (Signed)
Pt. Stated, its more SOB  ?

## 2021-08-02 ENCOUNTER — Emergency Department (HOSPITAL_COMMUNITY): Payer: Medicare Other

## 2021-08-02 ENCOUNTER — Encounter (HOSPITAL_COMMUNITY): Payer: Self-pay | Admitting: Internal Medicine

## 2021-08-02 ENCOUNTER — Observation Stay (HOSPITAL_COMMUNITY)
Admission: EM | Admit: 2021-08-02 | Discharge: 2021-08-02 | Disposition: A | Discharge status: Home / Self Care | Payer: Medicare Other | Attending: Student | Admitting: Student

## 2021-08-02 DIAGNOSIS — Z79899 Other long term (current) drug therapy: Secondary | ICD-10-CM | POA: Diagnosis not present

## 2021-08-02 DIAGNOSIS — E118 Type 2 diabetes mellitus with unspecified complications: Secondary | ICD-10-CM

## 2021-08-02 DIAGNOSIS — E1122 Type 2 diabetes mellitus with diabetic chronic kidney disease: Secondary | ICD-10-CM

## 2021-08-02 DIAGNOSIS — I5043 Acute on chronic combined systolic (congestive) and diastolic (congestive) heart failure: Secondary | ICD-10-CM | POA: Diagnosis not present

## 2021-08-02 DIAGNOSIS — N1832 Chronic kidney disease, stage 3b: Secondary | ICD-10-CM | POA: Diagnosis present

## 2021-08-02 DIAGNOSIS — Z20822 Contact with and (suspected) exposure to covid-19: Secondary | ICD-10-CM | POA: Insufficient documentation

## 2021-08-02 DIAGNOSIS — R778 Other specified abnormalities of plasma proteins: Secondary | ICD-10-CM | POA: Diagnosis not present

## 2021-08-02 DIAGNOSIS — R7989 Other specified abnormal findings of blood chemistry: Secondary | ICD-10-CM | POA: Diagnosis present

## 2021-08-02 DIAGNOSIS — I4892 Unspecified atrial flutter: Secondary | ICD-10-CM

## 2021-08-02 DIAGNOSIS — E119 Type 2 diabetes mellitus without complications: Secondary | ICD-10-CM

## 2021-08-02 DIAGNOSIS — I5041 Acute combined systolic (congestive) and diastolic (congestive) heart failure: Secondary | ICD-10-CM

## 2021-08-02 DIAGNOSIS — R0602 Shortness of breath: Secondary | ICD-10-CM | POA: Diagnosis present

## 2021-08-02 DIAGNOSIS — N183 Chronic kidney disease, stage 3 unspecified: Secondary | ICD-10-CM

## 2021-08-02 DIAGNOSIS — I509 Heart failure, unspecified: Secondary | ICD-10-CM | POA: Diagnosis not present

## 2021-08-02 DIAGNOSIS — R609 Edema, unspecified: Secondary | ICD-10-CM | POA: Diagnosis not present

## 2021-08-02 DIAGNOSIS — I5023 Acute on chronic systolic (congestive) heart failure: Secondary | ICD-10-CM | POA: Diagnosis present

## 2021-08-02 DIAGNOSIS — I10 Essential (primary) hypertension: Secondary | ICD-10-CM | POA: Diagnosis present

## 2021-08-02 LAB — TROPONIN I (HIGH SENSITIVITY)
Troponin I (High Sensitivity): 34 ng/L — ABNORMAL HIGH (ref <18)
Troponin I (High Sensitivity): 34 ng/L — ABNORMAL HIGH (ref <18)

## 2021-08-02 LAB — CBC WITH DIFFERENTIAL/PLATELET
Abs Immature Granulocytes: 0.04 10*3/uL (ref 0.00–0.07)
Basophils Absolute: 0.1 10*3/uL (ref 0.0–0.1)
Basophils Relative: 1 %
Eosinophils Absolute: 0.2 10*3/uL (ref 0.0–0.5)
Eosinophils Relative: 2 %
HCT: 45.3 % (ref 39.0–52.0)
Hemoglobin: 14.2 g/dL (ref 13.0–17.0)
Immature Granulocytes: 0 %
Lymphocytes Relative: 16 %
Lymphs Abs: 1.6 10*3/uL (ref 0.7–4.0)
MCH: 29.3 pg (ref 26.0–34.0)
MCHC: 31.3 g/dL (ref 30.0–36.0)
MCV: 93.4 fL (ref 80.0–100.0)
Monocytes Absolute: 0.9 10*3/uL (ref 0.1–1.0)
Monocytes Relative: 8 %
Neutro Abs: 7.6 10*3/uL (ref 1.7–7.7)
Neutrophils Relative %: 73 %
Platelets: 213 10*3/uL (ref 150–400)
RBC: 4.85 MIL/uL (ref 4.22–5.81)
RDW: 14.4 % (ref 11.5–15.5)
WBC: 10.4 10*3/uL (ref 4.0–10.5)
nRBC: 0 % (ref 0.0–0.2)

## 2021-08-02 LAB — COMPREHENSIVE METABOLIC PANEL
ALT: 19 U/L (ref 0–44)
AST: 24 U/L (ref 15–41)
Albumin: 3.9 g/dL (ref 3.5–5.0)
Alkaline Phosphatase: 94 U/L (ref 38–126)
Anion gap: 8 (ref 5–15)
BUN: 25 mg/dL — ABNORMAL HIGH (ref 8–23)
CO2: 18 mmol/L — ABNORMAL LOW (ref 22–32)
Calcium: 9.2 mg/dL (ref 8.9–10.3)
Chloride: 108 mmol/L (ref 98–111)
Creatinine, Ser: 1.84 mg/dL — ABNORMAL HIGH (ref 0.61–1.24)
GFR, Estimated: 38 mL/min — ABNORMAL LOW (ref >60)
Glucose, Bld: 158 mg/dL — ABNORMAL HIGH (ref 70–99)
Potassium: 5 mmol/L (ref 3.5–5.1)
Sodium: 134 mmol/L — ABNORMAL LOW (ref 135–145)
Total Bilirubin: 1.8 mg/dL — ABNORMAL HIGH (ref 0.3–1.2)
Total Protein: 6.6 g/dL (ref 6.5–8.1)

## 2021-08-02 LAB — RESP PANEL BY RT-PCR (FLU A&B, COVID) ARPGX2
Influenza A by PCR: NEGATIVE
Influenza B by PCR: NEGATIVE
SARS Coronavirus 2 by RT PCR: NEGATIVE

## 2021-08-02 LAB — BRAIN NATRIURETIC PEPTIDE: B Natriuretic Peptide: 524.7 pg/mL — ABNORMAL HIGH (ref 0.0–100.0)

## 2021-08-02 MED ORDER — FUROSEMIDE 40 MG PO TABS: ORAL_TABLET | ORAL | 0 refills | Status: DC

## 2021-08-02 MED ORDER — FUROSEMIDE 10 MG/ML IJ SOLN
40.0000 mg | Freq: Once | INTRAMUSCULAR | Status: AC
Start: 2021-08-02 — End: 2021-08-02
  Administered 2021-08-02: 40 mg via INTRAVENOUS
  Filled 2021-08-02: qty 4

## 2021-08-02 NOTE — ED Notes (Signed)
EDP at bedside  

## 2021-08-02 NOTE — ED Triage Notes (Signed)
Pt BIB EMS from home. Pt was here earlier for SOB. Found to be in aflutter, cardioverted to normal sinus and discharged home. Pt states he was SOB at time of discharge but has gotten progressively worse throughout the night. ?Pt was 95% RA with Ems - lungs sound clear with some stridor - Per EMS pt epiglottis sounded constricted so given a '1mg'$  Epi neb with some improvement - 99% on Neb ?VS with EMS  ?188/100 - hx of HTN - medicated  ?NSR on monitor with LBBB ?

## 2021-08-02 NOTE — Hospital Course (Addendum)
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   ? ?Patient presented to ED with dyspnea and palpitation on 08/01/2020 and found to be in a flutter.  He had successful DCCV in ED and discharged home after discussion between EDP and cardiology.  He returns to ED last night with shortness of breath.  Per triage note, patient was saturating at 95% on RA's when EMS arrived but some concern about stridor and he was given 1 mg epinephrine nebulizer with some improvement.  He was hypertensive to 188/100.  EKG NSR with LBBB.  ? ?In ED, hypertensive to 195/118 but improved to 150s/80s.  Initially started on 4 L by [place] but weaned to room air.  CXR with increased interstitial markings in the lung bases.   Cr 1.84 (about baseline).  CO2 18.  AG 8.  CBC without significant finding. BNP 524.  Troponin 34x2.  Twelve-lead EKG showed sinus tachycardia to 102 but no acute ischemic finding or significant abnormal intervals.  Patient was given IV Lasix.  Admitted by the overnight admitted for acute on chronic combined CHF exacerbation. ? ?The next morning, patient felt well, and asked if he could be discharged.  He was liberated off oxygen to room air.  Saturation remained above 93% with ambulation on room air without respiratory distress.  He denied palpitation, dizziness, chest pain, shortness of breath, orthopnea, PND, edema, GI or UTI symptoms. ? ?Of note, patient admitted to using pseudoephedrine for nasal congestion 2 days ago.  This could have triggered his atrial flutter and acute CHF.  He is also not compliant with Jardiance stating that Jardiance causes  him some skin rash in groin area.  Patient is not on Lasix prior to admission. ? ?Patient is discharged on p.o. Lasix 40 mg daily for 2 days followed by 40 mg as needed.patient to follow-up with his  cardiologist in 1 week for repeat BMP and evaluation.  He has been advised to avoid over-the-counter cold cold or flu and pain medication except plain Tylenol.  Also counseled on sodium and fluid restriction, and daily weight.  Patient's wife present at bedside during this encounter. ?

## 2021-08-02 NOTE — Assessment & Plan Note (Signed)
Recent Labs  ?  08/19/20 ?0930 08/20/20 ?0805 08/21/20 ?0677 08/22/20 ?1010 08/01/21 ?1415 08/02/21 ?0144  ?BUN 26* 24* '23 19 23 '$ 25*  ?CREATININE 1.87* 1.65* 1.66* 1.79* 1.88* 1.84*  ?At baseline.  Recheck renal panel in 1 week now is on Lasix. ? ?

## 2021-08-02 NOTE — Assessment & Plan Note (Addendum)
Likely demand ischemia.  No chest pain.  Slightly elevated troponin but no delta.  EKG without acute ischemic finding. ?-Continue home medications ?

## 2021-08-02 NOTE — Discharge Summary (Signed)
? ?Physician Discharge Summary  ?[name] [name] FBP:[id number] DOB: Feb 21, 1946 DOA: 08/02/2021 ? ?PCP: Christain Sacramento, MD ? ?Admit date: 08/02/2021 ?Discharge date: 08/02/2021 ?Admitted From: Home ?Disposition: Home ?Recommendations for Outpatient Follow-up:  ?Follow ups as below. ?Please obtain CBC/BMP/Mag at follow up ?Please follow up on the following pending results: None ? ?Home Health: Not indicated ?Equipment/Devices: Not indicated ? ?Discharge Condition: Stable ?CODE STATUS: Full code ? Follow-up Information   ? ? Christain Sacramento, MD. Schedule an appointment as soon as possible for a visit in 1 week(s).   ?Specialty: Family Medicine ?Contact information: ?[address] ?[place] [address] ?[phone] ? ? ?  ?  ? ? Bensimhon, Shaune Pascal, MD. Schedule an appointment as soon as possible for a visit in 1 week(s).   ?Specialty: Cardiology ?Contact information: ?[address] ?[address] ?[place] [address] ?[phone] ? ? ?  ?  ? ? Deboraha Sprang, MD. Schedule an appointment as soon as possible for a visit in 1 week(s).   ?Specialty: Cardiology ?Contact information: ?[address] ?[address] ?[place] [address] ?[phone] ? ? ?  ?  ? ?  ?  ? ?  ? ? 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   ? ?Patient presented to ED with dyspnea and palpitation on 08/01/2020 and found to be in a flutter.  He had successful DCCV in ED and discharged home after discussion between EDP and cardiology.  He returns to ED last night with shortness of breath.  Per triage note, patient was saturating at 95% on RA's when EMS arrived but some concern about stridor and he was given 1 mg epinephrine nebulizer with some improvement.  He was hypertensive to 188/100.  EKG NSR with LBBB.  ? ?In ED, hypertensive to  195/118 but improved to 150s/80s.  Initially started on 4 L by [place] but weaned to room air.  CXR with increased interstitial markings in the lung bases.   Cr 1.84 (about baseline).  CO2 18.  AG 8.  CBC without significant finding. BNP 524.  Troponin 34x2.  Twelve-lead EKG showed sinus tachycardia to 102 but no acute ischemic finding or significant abnormal intervals.  Patient was given IV Lasix.  Admitted by the overnight admitted for acute on chronic combined CHF exacerbation. ? ?The next morning, patient felt well, and asked if he could be discharged.  He was liberated off oxygen to room air.  Saturation remained above 93% with ambulation on room air without respiratory distress.  He denied palpitation, dizziness, chest pain, shortness of breath, orthopnea, PND, edema, GI or UTI symptoms. ? ?Of note, patient admitted to using pseudoephedrine for nasal congestion 2 days ago.  This could have triggered his atrial flutter and acute CHF.  He is also not compliant with Jardiance stating that Jardiance causes  him some skin rash in groin area.  Patient is not on Lasix prior to admission. ? ?Patient is discharged on p.o. Lasix 40 mg daily for 2 days followed by 40 mg as needed.patient to follow-up with his cardiologist in 1 week for repeat BMP and evaluation.  He has been advised to avoid over-the-counter cold cold or flu and pain medication except plain Tylenol.  Also counseled on sodium and fluid restriction, and  daily weight.  Patient's wife present at bedside during this encounter.  ? ?See individual problem list below for more on hospital course. ? ?Problems addressed during this hospitalization ?Problem  ?Acute On Chronic Combined Systolic and Diastolic Chf (Congestive Heart Failure) (Hcc)  ?Paroxysmal A-fib/flutter  ?Elevated troponin and patient with history of CAD/CABG  ?Chronic Kidney Disease, Stage 3b (Hcc)  ?Essential Hypertension  ?Diabetes Mellitus Type 2, Controlled (Hcc)  ?Acute Chf (Congestive Heart  Failure) (Hcc) (Resolved)  ?  ?Assessment and Plan: ?* Acute on chronic combined systolic and diastolic CHF (congestive heart failure) ([place]) ?TTE in 10/2020 with LVEF of 20 to 25%, G2-DD and [place] in 10/2020.  Patient has ICD.  Likely provoked by atrial flutter in the setting of pseudoephedrine use.  Respiratory symptoms resolved with IV Lasix 40 mg x 1.  He says he had about 3 urinals of urine since IV Lasix.  Currently denies cardiopulmonary symptoms.  He is eager to go home.  He was ambulated on room air and maintained appropriate saturation.  He appears euvolemic on exam except for some bibasilar crackles.  He is not on diuretics prior to presentation. ?-Discharged on p.o. Lasix 40 mg daily followed by 40 mg as needed until follow-up with his cardiologist in 1 week or sooner if needed. ?-Advised to avoid over-the-counter cold and flu, and pain medications other than plain Tylenol ?-Counseled on sodium and fluid restriction and daily weight ?-Patient understands that he may have to return to ED if his symptoms get worse ? ?Paroxysmal A-fib/flutter ?Likely from pseudoephedrine.  Successful DCCV in ED on 4/4.  Remains in sinus rhythm. ?-Continue home Coreg and Eliquis ?-Outpatient follow-up with cardiology/electrophysiology ? ?Elevated troponin and patient with history of CAD/CABG ?Likely demand ischemia.  No chest pain.  Slightly elevated troponin but no delta.  EKG without acute ischemic finding. ?-Continue home medications ? ?Chronic kidney disease, stage 3b ([place]) ?Recent Labs  ?  08/19/20 ?0930 08/20/20 ?0805 08/21/20 ?8469 08/22/20 ?1010 08/01/21 ?1415 08/02/21 ?0144  ?BUN 26* 24* '23 19 23 '$ 25*  ?CREATININE 1.87* 1.65* 1.66* 1.79* 1.88* 1.84*  ?At baseline.  Recheck renal panel in 1 week now is on Lasix. ? ? ?Diabetes mellitus type 2, controlled ([place]) ?Continue home meds. ? ?Essential hypertension ?Blood pressure elevated but improved. ?-Continue home meds. ?-Advised to avoid Sudafed ?-Added Lasix as  above. ? ? ? ? ?Vital signs ?Vitals:  ? 08/02/21 0900 08/02/21 0930 08/02/21 0945 08/02/21 1007  ?BP:  (!) 168/88 (!) 155/93   ?Pulse: 82 83 81   ?Temp:    97.6 ?F (36.4 ?C)  ?Resp: 14 18 (!) 42   ?Height:      ?Weight:      ?SpO2: 96% 94% 98%   ?TempSrc:    Oral  ?BMI (Calculated):      ?  ? ?Discharge exam ? ?GENERAL: No apparent distress.  Nontoxic. ?HEENT: MMM.  Vision and hearing grossly intact.  ?NECK: Supple.  No apparent JVD.  ?RESP:  No IWOB.  Fair aeration bilaterally.  Bibasilar crackles. ?CVS:  RRR. Heart sounds normal.  ?ABD/GI/GU: BS+. Abd soft, NTND.  ?MSK/EXT:  Moves extremities. No apparent deformity. No edema.  ?SKIN: no apparent skin lesion or wound ?NEURO: Awake and alert. Oriented appropriately.  No apparent focal neuro deficit. ?PSYCH: Calm. Normal affect.  ? ?Discharge Instructions ?Discharge Instructions   ? ? (HEART FAILURE PATIENTS) Call MD:  Anytime you have any of the following symptoms: 1) 3 pound weight gain in 24 hours  or 5 pounds in 1 week 2) shortness of breath, with or without a dry hacking cough 3) swelling in the hands, feet or stomach 4) if you have to sleep on extra pillows at night in order to breathe.   Complete by: As directed ?  ? Call MD for:  difficulty breathing, headache or visual disturbances   Complete by: As directed ?  ? Call MD for:  extreme fatigue   Complete by: As directed ?  ? Call MD for:  persistant dizziness or light-headedness   Complete by: As directed ?  ? Diet - low sodium heart healthy   Complete by: As directed ?  ? Diet Carb Modified   Complete by: As directed ?  ? Discharge instructions   Complete by: As directed ?  ? It has been a pleasure taking care of you! ? ?You were hospitalized due to shortness of breath likely triggered by atrial flutter you had the day prior.  Your symptoms improved quickly with IV Lasix.  We are discharging you on oral Lasix.  We also recommend you taking Jardiance and other medications as prescribed.  Please review your new  medication list and the directions on your medications before you take them.  Follow-up with your cardiologist within 1 week.  ? ?We strongly recommend you avoid over-the-counter cold and flu medications and pain medica

## 2021-08-02 NOTE — Care Management CC44 (Signed)
Condition Code 44 Documentation Completed ? ?Patient Details  ?Name: [name] [name] ?MRN: [id number] ?Date of Birth: March 09, 1946 ? ? ?Condition Code 44 given:  Yes ?Patient signature on Condition Code 44 notice:  Yes ?Documentation of 2 MD's agreement:  Yes ?Code 44 added to claim:  Yes ? ? ? ?Fuller Mandril, RN ?08/02/2021, 10:02 AM ? ?

## 2021-08-02 NOTE — ED Notes (Signed)
ED TO INPATIENT HANDOFF REPORT ? ?ED Nurse Name and Phone #: [phone] [place]  ? ?S ?Name/Age/Gender ?[name] [name] ?76 y.o. ?male ?Room/Bed: 015C/015C ? ?Code Status ?  Code Status: Prior ? ?Home/SNF/Other ?Home ?Patient oriented to: self, place, time, and situation ?Is this baseline? Yes  ? ?Triage Complete: Triage complete  ?Chief Complaint ?Acute CHF (congestive heart failure) (HCC) [I50.9] ? ?Triage Note ?Pt BIB EMS from home. Pt was here earlier for SOB. Found to be in aflutter, cardioverted to normal sinus and discharged home. Pt states he was SOB at time of discharge but has gotten progressively worse throughout the night. ?Pt was 95% RA with Ems - lungs sound clear with some stridor - Per EMS pt epiglottis sounded constricted so given a '1mg'$  Epi neb with some improvement - 99% on Neb ?VS with EMS  ?188/100 - hx of HTN - medicated  ?NSR on monitor with LBBB  ? ?Allergies ?Allergies  ?Allergen Reactions  ? Spironolactone Other (See Comments)  ?  Painful gynecomastia  ? Niacin And Related   ? ? ?Level of Care/Admitting Diagnosis ?ED Disposition   ? ? ED Disposition  ?Admit  ? Condition  ?--  ? Comment  ?Hospital Area: [facility] [[id number]] ? Level of Care: Progressive [102] ? Admit to Progressive based on following criteria: CARDIOVASCULAR & THORACIC of moderate stability with acute coronary syndrome symptoms/low risk myocardial infarction/hypertensive urgency/arrhythmias/heart failure potentially compromising stability and stable post cardiovascular intervention patients. ? May admit patient to [facility] or [facility] if equivalent level of care is available:: Yes ? Covid Evaluation: Asymptomatic - no recent exposure (last 10 days) testing not required ? Diagnosis: Acute CHF (congestive heart failure) ([place]) [716967] ? Admitting Physician: Shela Leff [[id number]] ? Attending Physician: Shela Leff [[id number]] ? Estimated length of stay: past midnight tomorrow ? Certification:: I  certify this patient will need inpatient services for at least 2 midnights ?  ?  ? ?  ? ? ?B ?Medical/Surgery History ?Past Medical History:  ?Diagnosis Date  ? AICD (automatic cardioverter/defibrillator) present   ? Arthritis   ? Bladder cancer ([place])   ? Borderline glaucoma   ? Chronic systolic (congestive) heart failure (HCC)   ? Coronary artery disease   ? Diabetes mellitus type 2, controlled ([place]) ORAL MED  ? Essential hypertension   ? Heart murmur   ? Hepatitis   ? Medical history non-contributory   ? Paroxysmal A-fib ([place])   ? Paroxysmal A-fib ([place]) 08/20/2020  ? Peripheral vascular disease ([place])   ? Pneumonia   ? ?Past Surgical History:  ?Procedure Laterality Date  ? CAROTID DUPLEX SCAN  07-26-2010  ? BILATERAL ICA  STENOSIS 1% - 39%  ? CHOLECYSTECTOMY N/A 08/22/2020  ? Procedure: LAPAROSCOPIC CHOLECYSTECTOMY;  Surgeon: Rolm Bookbinder, MD;  Location: [place];  Service: General;  Laterality: N/A;  ? CORONARY ARTERY BYPASS GRAFT N/A 04/08/2017  ? Procedure: CORONARY ARTERY BYPASS GRAFTING (CABG) TIMES TWO USING LEFT INTERNAL MAMMARY ARTERY AND LEFT SAPHENOUS LEG VEIN HARVESTED ENDOSCOPICALLY.  LEG VEIN ALSO HARVESTED FROM THE RIGHT LEG;  Surgeon: [name], MD;  Location: [place] [facility];  Service: Open Heart Surgery;  Laterality: N/A;  ? CYSTOSCOPY WITH BIOPSY N/A 08/01/2012  ? Procedure: CYSTOSCOPY WITH BIOPSY BLADDER BIOPSY   ;  Surgeon: Fredricka Bonine, MD;  [facility];  Service: Urology;  Laterality: N/A;  ? ERCP N/A 08/21/2020  ? Procedure: ENDOSCOPIC RETROGRADE CHOLANGIOPANCREATOGRAPHY (ERCP);  Surgeon: Carol Ada, MD;  [facility];  Service: Endoscopy;  Laterality: N/A;  ? FULGURATION OF BLADDER TUMOR N/A 08/01/2012  ? Procedure: FULGURATION OF BLADDER TUMOR;  Surgeon: Fredricka Bonine, MD;  Location[facility];  Service: Urology;  Laterality: N/A;  ? ICD IMPLANT  11/20/2017  ? ICD IMPLANT N/A 11/20/2017  ? Procedure: ICD IMPLANT;  Surgeon:  Deboraha Sprang, MD;  Location: [place] [facility];  Service: Cardiovascular;  Laterality: N/A;  ? LEFT HEART CATH AND CORONARY ANGIOGRAPHY N/A 03/31/2017  ? Procedure: LEFT HEART CATH AND CORONARY ANGIOGRAPHY;  Surgeon: Leonie Man, MD;  Location: [place] [facility];  Service: Cardiovascular;  Laterality: N/A;  ? LUMBAR LAMINECTOMY/DECOMPRESSION MICRODISCECTOMY Bilateral 06/16/2019  ? Procedure: Bilateral Lumbar Four-Five Laminectomy and Foraminotomy;  Surgeon: Earnie Larsson, MD;  Location: [place];  Service: Neurosurgery;  Laterality: Bilateral;  posterior  ? REMOVAL OF STONES  08/21/2020  ? Procedure: REMOVAL OF STONES;  Surgeon: Carol Ada, MD;  Location: [place];  Service: Endoscopy;;  ? RIGHT HEART CATH Right 03/31/2017  ? Procedure: RIGHT HEART CATH;  Surgeon: Leonie Man, MD;  Location: [place] [facility];  Service: Cardiovascular;  Laterality: Right;  ? SPHINCTEROTOMY  08/21/2020  ? Procedure: SPHINCTEROTOMY;  Surgeon: Carol Ada, MD;  Location: [place];  Service: Endoscopy;;  ? TEE WITHOUT CARDIOVERSION N/A 04/08/2017  ? Procedure: TRANSESOPHAGEAL ECHOCARDIOGRAM (TEE);  Surgeon: [name], MD;  Location: [place];  Service: Open Heart Surgery;  Laterality: N/A;  ? TRANSURETHRAL RESECTION OF BLADDER TUMOR  05/29/2011  ? Procedure: TRANSURETHRAL RESECTION OF BLADDER TUMOR (TURBT);  Surgeon: Fredricka Bonine, MD;  Location: [facility];  Service: Urology;  Laterality: N/A;  ? VENTRICULAR ASSIST DEVICE INSERTION Right 03/31/2017  ? Procedure: VENTRICULAR ASSIST DEVICE INSERTION;  Surgeon: Leonie Man, MD;  Location: [place] [facility];  Service: Cardiovascular;  Laterality: Right;  ?  ? ?A ?IV Location/Drains/Wounds ?Patient Lines/Drains/Airways Status   ? ? Active Line/Drains/Airways   ? ? Name Placement date Placement time Site Days  ? Peripheral IV 08/02/21 20 G Left Forearm 08/02/21  0136  Forearm  less than 1  ? Incision (Closed) 08/22/20 Abdomen 08/22/20  1242   -- 345  ? Incision - 4 Ports Abdomen 1: Right 2: Right 3: Mid;Upper 4: Mid 08/22/20  1240  -- 345  ? Wound / Incision (Open or Dehisced) 08/21/20 Skin tear Elbow Left;Posterior 08/21/20  0831  Elbow  346  ? ?  ?  ? ?  ? ? ?Intake/Output Last 24 hours ?No intake or output data in the 24 hours ending 08/02/21 0855 ? ?Labs/Imaging ?Results for orders placed or performed during the hospital encounter of 08/02/21 (from the past 48 hour(s))  ?Comprehensive metabolic panel     Status: Abnormal  ? Collection Time: 08/02/21  1:44 AM  ?Result Value Ref Range  ? Sodium 134 (L) 135 - 145 mmol/L  ? Potassium 5.0 3.5 - 5.1 mmol/L  ? Chloride 108 98 - 111 mmol/L  ? CO2 18 (L) 22 - 32 mmol/L  ? Glucose, Bld 158 (H) 70 - 99 mg/dL  ?  Comment: Glucose reference range applies only to samples taken after fasting for at least 8 hours.  ? BUN 25 (H) 8 - 23 mg/dL  ? Creatinine, Ser 1.84 (H) 0.61 - 1.24 mg/dL  ? Calcium 9.2 8.9 - 10.3 mg/dL  ? Total Protein 6.6 6.5 - 8.1 g/dL  ? Albumin 3.9 3.5 - 5.0 g/dL  ?  AST 24 15 - 41 U/L  ? ALT 19 0 - 44 U/L  ? Alkaline Phosphatase 94 38 - 126 U/L  ? Total Bilirubin 1.8 (H) 0.3 - 1.2 mg/dL  ? GFR, Estimated 38 (L) >60 mL/min  ?  Comment: (NOTE) ?Calculated using the CKD-EPI Creatinine Equation (2021) ?  ? Anion gap 8 5 - 15  ?  Comment: Performed at [facility], [place] [address], [place], [place] [address]  ?Troponin I (High Sensitivity)     Status: Abnormal  ? Collection Time: 08/02/21  1:44 AM  ?Result Value Ref Range  ? Troponin I (High Sensitivity) 34 (H) <18 ng/L  ?  Comment: (NOTE) ?Elevated high sensitivity troponin I (hsTnI) values and significant  ?changes across serial measurements may suggest ACS but many other  ?chronic and acute conditions are known to elevate hsTnI results.  ?Refer to the "Links" section for chest pain algorithms and additional  ?guidance. ?Performed at [facility], [place] [address] [place], [place] ?[address] ?  ?Brain natriuretic peptide     Status:  Abnormal  ? Collection Time: 08/02/21  2:20 AM  ?Result Value Ref Range  ? B Natriuretic Peptide 524.7 (H) 0.0 - 100.0 pg/mL  ?  Comment: Performed at [facility], [place],

## 2021-08-02 NOTE — Assessment & Plan Note (Signed)
Blood pressure elevated but improved. ?-Continue home meds. ?-Advised to avoid Sudafed ?-Added Lasix as above. ?

## 2021-08-02 NOTE — H&P (Signed)
?History and Physical  ? ? ?[name] [name] MOQ:[id number] DOB: May 20, 1945 DOA: 08/02/2021 ? ?PCP: Christain Sacramento, MD ?Patient coming from: Home Home. ? ?Chief Complaint: Shortness of breath ? 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   ? ?Patient presented to ED with dyspnea and palpitation on 08/01/2020 and found to be in a flutter.  He had successful DCCV in ED and discharged home after discussion between EDP and cardiology.  He returns to ED last night with shortness of breath.  Per triage note, patient was saturating at 95% on RA's when EMS arrived but some concern about stridor and he was given 1 mg epinephrine nebulizer with some improvement.  He was hypertensive to 188/100.  EKG NSR with LBBB.  ? ?In ED, hypertensive to 195/118 but improved to 150s/80s.  Initially started on 4 L by [place] but weaned to room air.  CXR with increased interstitial markings in the lung bases.   Cr 1.84 (about baseline).  CO2 18.  AG 8.  CBC without significant finding. BNP 524.  Troponin 34x2.  Twelve-lead EKG showed sinus tachycardia to 102 but no acute ischemic finding or significant abnormal intervals.  Patient was given IV Lasix.  Admitted by the overnight admitted for acute on chronic combined CHF exacerbation. ? ?The next morning, patient felt well, and asked if he could be discharged.  He was liberated off oxygen to room air.  Saturation remained above 93% with ambulation on room air without respiratory distress.  He denied palpitation, dizziness, chest pain, shortness of breath, orthopnea, PND, edema, GI or UTI symptoms. ? ?Of note, patient admitted to using pseudoephedrine for nasal congestion 2 days ago.  This could have triggered his atrial flutter and acute CHF.  He is also not compliant with Jardiance stating that Jardiance causes   him some skin rash in groin area.  Patient is not on Lasix prior to admission. ? ?Patient is discharged on p.o. Lasix 40 mg daily for 2 days followed by 40 mg as needed.patient to follow-up with his cardiologist in 1 week for repeat BMP and evaluation.  He has been advised to avoid over-the-counter cold cold or flu and pain medication except plain Tylenol.  Also counseled on sodium and fluid restriction, and daily weight.  Patient's wife present at bedside during this encounter.  ? ? ? ?ROS ?All review of system negative except for pertinent positives and negatives as history of present illness above. ? ?PMH ?Past Medical History:  ?Diagnosis Date  ? AICD (automatic cardioverter/defibrillator) present   ? Arthritis   ? Bladder cancer ([place])   ? Borderline glaucoma   ? Chronic systolic (congestive) heart failure (HCC)   ? Coronary artery disease   ? Diabetes mellitus type 2, controlled ([place]) ORAL MED  ? Essential hypertension   ? Heart murmur   ? Hepatitis   ? Medical history non-contributory   ? Paroxysmal A-fib ([place])   ? Paroxysmal A-fib ([place]) 08/20/2020  ? Peripheral vascular disease ([place])   ? Pneumonia   ? ?PSH ?Past Surgical History:  ?Procedure Laterality Date  ? CAROTID DUPLEX SCAN  07-26-2010  ? BILATERAL ICA  STENOSIS 1% - 39%  ? CHOLECYSTECTOMY N/A 08/22/2020  ? Procedure: LAPAROSCOPIC CHOLECYSTECTOMY;  Surgeon: Rolm Bookbinder, MD;  Location: [facility] [facility];  Service: General;  Laterality: N/A;  ? CORONARY ARTERY BYPASS GRAFT N/A 04/08/2017  ? Procedure: CORONARY ARTERY BYPASS GRAFTING (CABG) TIMES TWO USING LEFT INTERNAL MAMMARY ARTERY AND LEFT SAPHENOUS LEG VEIN HARVESTED ENDOSCOPICALLY.  LEG VEIN ALSO HARVESTED FROM THE RIGHT LEG;  Surgeon: [name], MD;  Location: [facility];  Service: Open Heart Surgery;  Laterality: N/A;  ? CYSTOSCOPY WITH BIOPSY N/A 08/01/2012  ? Procedure: CYSTOSCOPY WITH BIOPSY BLADDER BIOPSY   ;  Surgeon: Fredricka Bonine, MD;  Location: [facility];  Service:  Urology;  Laterality: N/A;  ? ERCP N/A 08/21/2020  ? Procedure: ENDOSCOPIC RETROGRADE CHOLANGIOPANCREATOGRAPHY (ERCP);  Surgeon: Carol Ada, MD;  Location: [place];  Service: Endoscopy;  Laterality: N/A;  ? FULGURATION OF BLADDER TUMOR N/A 08/01/2012  ? Procedure: FULGURATION OF BLADDER TUMOR;  Surgeon: Fredricka Bonine, MD;  Location: [facility];  Service: Urology;  Laterality: N/A;  ? ICD IMPLANT  11/20/2017  ? ICD IMPLANT N/A 11/20/2017  ? Procedure: ICD IMPLANT;  Surgeon: Deboraha Sprang, MD;  Location: [place] [facility];  Service: Cardiovascular;  Laterality: N/A;  ? LEFT HEART CATH AND CORONARY ANGIOGRAPHY N/A 03/31/2017  ? Procedure: LEFT HEART CATH AND CORONARY ANGIOGRAPHY;  Surgeon: Leonie Man, MD;  Location: [place] [facility];  Service: Cardiovascular;  Laterality: N/A;  ? LUMBAR LAMINECTOMY/DECOMPRESSION MICRODISCECTOMY Bilateral 06/16/2019  ? Procedure: Bilateral Lumbar Four-Five Laminectomy and Foraminotomy;  Surgeon: Earnie Larsson, MD;  Location: [place];  Service: Neurosurgery;  Laterality: Bilateral;  posterior  ? REMOVAL OF STONES  08/21/2020  ? Procedure: REMOVAL OF STONES;  Surgeon: Carol Ada, MD;  Location: [place];  Service: Endoscopy;;  ? RIGHT HEART CATH Right 03/31/2017  ? Procedure: RIGHT HEART CATH;  Surgeon: Leonie Man, MD;  Location: [place] [facility] [facility];  Service: Cardiovascular;  Laterality: Right;  ? SPHINCTEROTOMY  08/21/2020  ? Procedure: SPHINCTEROTOMY;  Surgeon: Carol Ada, MD;  Location: [place];  Service: Endoscopy;;  ? TEE WITHOUT CARDIOVERSION N/A 04/08/2017  ? Procedure: TRANSESOPHAGEAL ECHOCARDIOGRAM (TEE);  Surgeon: [name], MD;  Location: [place];  Service: Open Heart Surgery;  Laterality: N/A;  ? TRANSURETHRAL RESECTION OF BLADDER TUMOR  05/29/2011  ? Procedure: TRANSURETHRAL RESECTION OF BLADDER TUMOR (TURBT);  Surgeon: Fredricka Bonine, MD;  Location: [facility];  Service: Urology;  Laterality:  N/A;  ? VENTRICULAR ASSIST DEVICE INSERTION Right 03/31/2017  ? Procedure: VENTRICULAR ASSIST DEVICE INSERTION;  Surgeon: Leonie Man, MD;  Location: [facility] [facility];  Service: Cardiovascular;  Laterality: Right;  ? ?Fam HX ?Family History  ?Problem Relation Age of Onset  ? Heart attack Sister   ? Heart disease Sister   ? ? ?Social Hx ? reports that he quit smoking about 4 years ago. His smoking use included cigarettes. He has a 40.00 pack-year smoking history. He has never used smokeless tobacco. He reports that he does not currently use alcohol. He reports that he does not use drugs. ? ?Allergy ?Allergies  ?Allergen Reactions  ? Spironolactone Other (See Comments)  ?  Painful gynecomastia  ? Niacin And Related   ? ?Home Meds ?Prior to Admission medications   ?Medication Sig Start Date End Date Taking? Authorizing Provider  ?atorvastatin (LIPITOR) 80 MG tablet Take 1 tablet (80 mg total) by mouth daily. NEEDS FOLLOW UP APPOINTMENT FOR ANYMORE REFILLS ?Patient taking differently: Take 80 mg by mouth every evening. 07/17/21  Yes Bensimhon, Shaune Pascal, MD  ?carvedilol (COREG)  3.125 MG tablet Take 1 tablet (3.125 mg total) by mouth 2 (two) times daily. NEEDS FOLLOW UP APPOINTMENT FOR ANYMORE REFILLS ?Patient taking differently: Take 3.125 mg by mouth every evening. 07/17/21  Yes Bensimhon, Shaune Pascal, MD  ?ELIQUIS 5 MG TABS tablet TAKE 1 TABLET TWICE A DAY ?Patient taking differently: Take 5 mg by mouth 2 (two) times daily. 11/17/20  Yes Bensimhon, Shaune Pascal, MD  ?furosemide (LASIX) 40 MG tablet Take 1 tablet (40 mg total) by mouth daily for 2 days, THEN 1 tablet (40 mg total) daily as needed for up to 28 days for fluid or edema (and shortness of breath). 08/02/21 09/01/21 Yes Mercy Riding, MD  ?metFORMIN (GLUCOPHAGE-XR) 500 MG 24 hr tablet Take 1 tablet (500 mg total) by mouth daily with breakfast. 08/23/20  Yes Vann, Jessica U, DO  ?sacubitril-valsartan (ENTRESTO) 97-103 MG Take 1 tablet by mouth 2 (two) times daily.  NEEDS FOLLOW UP APPOINTMENT FOR ANYMORE REFILLS ?Patient taking differently: Take 1 tablet by mouth 2 (two) times daily. 07/17/21  Yes Bensimhon, Shaune Pascal, MD  ?empagliflozin (JARDIANCE) 10 MG TABS tablet Take

## 2021-08-02 NOTE — ED Notes (Signed)
Pt ambulated with pulse ox 95% on RA prior to ambulating, 93% on RA during ambulation. Denies ShOB during ambulation.  ?

## 2021-08-02 NOTE — ED Notes (Signed)
Pt requesting to leave, states that he feels better and does not want to stay for admission. This RN requested that pt stay to speak with physician when rounds are completed. Pt agreed to stay at this time.  ?

## 2021-08-02 NOTE — Assessment & Plan Note (Signed)
Likely from pseudoephedrine.  Successful DCCV in ED on 4/4.  Remains in sinus rhythm. ?-Continue home Coreg and Eliquis ?-Outpatient follow-up with cardiology/electrophysiology ?

## 2021-08-02 NOTE — Care Management CC44 (Signed)
Condition Code 44 Documentation Completed ? ?Patient Details  ?Name: [name] [name] ?MRN: [id number] ?Date of Birth: 1945/11/01 ? ? ?Condition Code 44 given:  Yes ?Patient signature on Condition Code 44 notice:  Yes ?Documentation of 2 MD's agreement:  Yes ?Code 44 added to claim:  Yes ? ? ? ?Fuller Mandril, RN ?08/02/2021, 10:02 AM ? ?

## 2021-08-02 NOTE — Assessment & Plan Note (Signed)
-   Continue home meds °

## 2021-08-02 NOTE — Assessment & Plan Note (Addendum)
TTE in 10/2020 with LVEF of 20 to 25%, G2-DD and [place] in 10/2020.  Patient has ICD.  Likely provoked by atrial flutter in the setting of pseudoephedrine use.  Respiratory symptoms resolved with IV Lasix 40 mg x 1.  He says he had about 3 urinals of urine since IV Lasix.  Currently denies cardiopulmonary symptoms.  He is eager to go home.  He was ambulated on room air and maintained appropriate saturation.  He appears euvolemic on exam except for some bibasilar crackles.  He is not on diuretics prior to presentation. ?-Discharged on p.o. Lasix 40 mg daily followed by 40 mg as needed until follow-up with his cardiologist in 1 week or sooner if needed. ?-Advised to avoid over-the-counter cold and flu, and pain medications other than plain Tylenol ?-Counseled on sodium and fluid restriction and daily weight ?-Patient understands that he may have to return to ED if his symptoms get worse ?

## 2021-08-04 NOTE — Progress Notes (Signed)
?Advanced Heart Failure Clinic Note  ? ?PCP: Christain Sacramento, MD ?HF Cardiologist: Dr. Haroldine Laws  ? ?HPI: ? ?[name] [name] is a 76 y.o. male with h/o DM2, COPD (quit smoking 12/18), severe 3v CAD s/p CABG 26/94/8546, chronic systolic HF due to ICM, EF 25-30% (echo 12/18),  HTN, DM2, h/o bladder CA s/p multiple rounds of BCG therapy, and paroxysmal AF.  ? ?Pt admitted in 12/18 with anterior STEMI and developed cardiogenic shock requiring Impella support. Urgent cath showed severe 3v CAD as below. While on impella, pt developed sepsis with WBC ~ 40K, UCx + pseudomonas. Treated for AECOPD and completed ABX therapy. BCx negative. Stabilized and underwent CABG x 2 with LIMA to LAD and SVG to OM on 04/08/2017. Pt transiently required milrinone but weaned prior to discharge. Required amiodarone for brief post op afib, but had no recurrence.  ? ?Echo 08/07/17 EF 35-40% laminated apical clot. (Dr.Bensimhon felt ~ 25-30%) and moderate to severe MR.  ? ?Cardiac MRI 09/2017 EF 30%, laminated apical thrombus, normal RV size and function, LGE pattern suggests infarction in LAD territory, unlikely to improve with revascularization ? ?S/p Medtronic ICD with Dr Caryl Comes 11/20/17. ? ?Saw Dr. Caryl Comes on 01/07/20 and was in AF with RVR. Potassium also high so Inspra stopped. Dr. Olin Pia note also mentions chronotropic incompetence with exercise. Carvedilol cut back to 3.125 bid.  ? ?Follow up 7/22, stable NYHA II, volume good. Echo showed EF 20-25% , RV mildly reduced, no LV clot. ? ?Admitted 4/23 with AFL with RVR. Underwent DCCV to NSR and discharged home. Presented back to ED next day with a/c CHF. Given IV lasix with improvement in symptoms. Discharged home on Lasix 40 mg daily x 2 days, then PRN.  ? ?Today he returns for post hospital HF follow up. Overall feeling ok. Occasionally feels palpations and felt "woozy" yesterday. Breathing better, but not back to baseline. Denies abnormal bleeding, CP, dizziness, edema, or PND/Orthopnea.  Appetite ok. No fever or chills. Weight at home 205 pounds. Taking all medications. Off Jardiance with yeast infection in groin. ? ?Cardiac Studies: ?- Echo 11/16/20 EF 20-25% RV mildly reduced No LV clot  ? ?- Echo 8/20 EF 25-30% RV ok. Small apical thrombus. ? ?- [facility] 03/31/2017 ?Prox RCA lesion is 100% stenosed. Dist RCA lesion is 100% stenosed (retrograde filling from collaterals does not go beyond the distal vessel) -->faint collateral filling to the distal PL and PDA from septal perforators and a diffusely diseased AV groove circumflex ?LAV Groove lesion is 90% stenosed. ?Prox LAD-1 lesion is 95% stenosed. Prox LAD-2 lesion is 85% stenosed. Heavily calcified, tandem lesions in very tortuous segment of the vessel. ?Prox Cx to Mid Cx lesion is 70% stenosed. Heavily calcified ?There is severe left ventricular systolic dysfunction. The left ventricular ejection fraction is less than 25% by visual estimate. ?LV end diastolic pressure is severely elevated -prior to Impella insertion ?Hemodynamic findings consistent with moderate pulmonary hypertension. ?Successful Impella insertion with 3.5-3.6 LPM flow's ? ?- Echo 04/01/2017 EF 15-20%  ? ?- Echo 04/16/17 (post op) Improved to 25-30% on 04/16/17 ? ?- Echo 08/07/17: EF 35-40%, mod to severe MR ? ?Review of systems complete and found to be negative unless listed in HPI.  ? ?Past Medical History:  ?Diagnosis Date  ? AICD (automatic cardioverter/defibrillator) present   ? Arthritis   ? Bladder cancer ([place])   ? Borderline glaucoma   ? Chronic systolic (congestive) heart failure (HCC)   ? Coronary artery disease   ?  Diabetes mellitus type 2, controlled ([place]) ORAL MED  ? Essential hypertension   ? Heart murmur   ? Hepatitis   ? Medical history non-contributory   ? Paroxysmal A-fib ([place])   ? Paroxysmal A-fib ([place]) 08/20/2020  ? Peripheral vascular disease ([place])   ? Pneumonia   ? ?Current Outpatient Medications  ?Medication Sig Dispense Refill  ? atorvastatin (LIPITOR) 80 MG  tablet Take 1 tablet (80 mg total) by mouth daily. NEEDS FOLLOW UP APPOINTMENT FOR ANYMORE REFILLS 30 tablet 0  ? carvedilol (COREG) 3.125 MG tablet Take 1 tablet (3.125 mg total) by mouth 2 (two) times daily. NEEDS FOLLOW UP APPOINTMENT FOR ANYMORE REFILLS (Patient taking differently: Take 3.125 mg by mouth every evening.) 60 tablet 0  ? ELIQUIS 5 MG TABS tablet TAKE 1 TABLET TWICE A DAY 180 tablet 3  ? furosemide (LASIX) 40 MG tablet Take 40 mg by mouth as needed.    ? metFORMIN (GLUCOPHAGE-XR) 500 MG 24 hr tablet Take 1 tablet (500 mg total) by mouth daily with breakfast.    ? sacubitril-valsartan (ENTRESTO) 97-103 MG Take 1 tablet by mouth 2 (two) times daily. NEEDS FOLLOW UP APPOINTMENT FOR ANYMORE REFILLS 180 tablet 0  ? ?No current facility-administered medications for this encounter.  ? ?Allergies  ?Allergen Reactions  ? Spironolactone Other (See Comments)  ?  Painful gynecomastia  ? Niacin And Related   ? ?Social History  ? ?Socioeconomic History  ? Marital status: Married  ?  Spouse name: Not on file  ? Number of children: Not on file  ? Years of education: Not on file  ? Highest education level: Not on file  ?Occupational History  ? Occupation: retired  ?Tobacco Use  ? Smoking status: Former  ?  Packs/day: 1.00  ?  Years: 40.00  ?  Pack years: 40.00  ?  Types: Cigarettes  ?  Quit date: 03/31/2017  ?  Years since quitting: 4.3  ? Smokeless tobacco: Never  ?Vaping Use  ? Vaping Use: Never used  ?Substance and Sexual Activity  ? Alcohol use: Not Currently  ?  Comment: rarely  ? Drug use: No  ? Sexual activity: Not on file  ?Other Topics Concern  ? Not on file  ?Social History Narrative  ? Not on file  ? ?Social Determinants of Health  ? ?Financial Resource Strain: Not on file  ?Food Insecurity: Not on file  ?Transportation Needs: Not on file  ?Physical Activity: Not on file  ?Stress: Not on file  ?Social Connections: Not on file  ?Intimate Partner Violence: Not on file  ? ?Family History  ?Problem Relation  Age of Onset  ? Heart attack Sister   ? Heart disease Sister   ? ?BP 122/78   Pulse (!) 106   Wt 92.8 kg (204 lb 9.6 oz)   SpO2 97%   BMI 26.27 kg/m?  ? ?Wt Readings from Last 3 Encounters:  ?08/07/21 92.8 kg (204 lb 9.6 oz)  ?08/02/21 99.8 kg (220 lb)  ?11/16/20 92.7 kg (204 lb 6.4 oz)  ?  ?PHYSICAL EXAM: ?General:  NAD. No resp difficulty ?HEENT: Normal ?Neck: Supple. No JVD. Carotids 2+ bilat; no bruits. No lymphadenopathy or thryomegaly appreciated. ?Cor: PMI nondisplaced. Regular rate & rhythm. No rubs, gallops or murmurs. ?Lungs: Clear ?Abdomen: Soft, nontender, nondistended. No hepatosplenomegaly. No bruits or masses. Good bowel sounds. ?Extremities: No cyanosis, clubbing, rash, edema ?Neuro: Alert & oriented x 3, cranial nerves grossly intact. Moves all 4 extremities w/o difficulty. Affect pleasant. ? ?  ECG (personally reviewed): typical AFL 123 bpm  ? ?ReDs: 29% ? ?Device interrogation (personally reviewed): OptiVol recently elevated but now down, stable thoracic impedence, + recent AF, 1 hr daily activity, no VT. ? ?ASSESSMENT & PLAN: ?1. Chronic systolic CHF due to ICM  ?- EF 04/01/17 EF 15-20% -> Improved to 25-30% on 04/16/17 Post op CABG as below.  ?- Echo 08/07/17 read as EF 35-40% but Dr Haroldine Laws reviewed personally and EF 25-30% (reviewed with Dr. Meda Coffee who agrees) + laminated apical clot and moderate to severe MR. RV ok.  ?- Cardiac MRI 09/2017 EF 30%, laminated apical thrombus, normal RV size and function, LGE pattern suggests infarction in LAD territory, unlikely to improve with revascularization ?- Echo 8/20 EF 25-30% RV ok. Mild MR Small apical thrombus.  ?- Echo 11/16/20 EF 20-25% RV mildly reduced No LV clot Personally reviewed ?- S/p Medtronic ICD with Dr Caryl Comes 11/20/17 ?- Stable NYHA II-early III. Volume status stable, ReDs 29%.  ?- Continue carvedilol 3.125 bid. (could not tolerate higher dosing due to severe chronotropic incompetence with exercise HR in 80s).  ?- Continue Entresto 97/103  mg bid. ?- Continue Lasix 40 mg PRN weight gain/dyspnea. ?- Off Jardiance with yeast infection. ?- Off eplerenone due hyperkalemia (painful gynecomastia with spiro). ?- BMET and BNP today. ? ?2. Paroxysmal Atria

## 2021-08-04 NOTE — H&P (View-Only) (Signed)
?Advanced Heart Failure Clinic Note  ? ?PCP: Christain [place], MD ?HF Cardiologist: Dr. Haroldine Laws  ? ?HPI: ? ?[name] [name] is a 76 y.o. male with h/o DM2, COPD (quit smoking 12/18), severe 3v CAD s/p CABG 30/12/2328, chronic systolic HF due to ICM, EF 25-30% (echo 12/18),  HTN, DM2, h/o bladder CA s/p multiple rounds of BCG therapy, and paroxysmal AF.  ? ?Pt admitted in 12/18 with anterior STEMI and developed cardiogenic shock requiring Impella support. Urgent cath showed severe 3v CAD as below. While on impella, pt developed sepsis with WBC ~ 40K, UCx + pseudomonas. Treated for AECOPD and completed ABX therapy. BCx negative. Stabilized and underwent CABG x 2 with LIMA to LAD and SVG to OM on 04/08/2017. Pt transiently required milrinone but weaned prior to discharge. Required amiodarone for brief post op afib, but had no recurrence.  ? ?Echo 08/07/17 EF 35-40% laminated apical clot. (Dr.Bensimhon felt ~ 25-30%) and moderate to severe MR.  ? ?Cardiac MRI 09/2017 EF 30%, laminated apical thrombus, normal RV size and function, LGE pattern suggests infarction in LAD territory, unlikely to improve with revascularization ? ?S/p Medtronic ICD with Dr Caryl Comes 11/20/17. ? ?Saw Dr. Caryl Comes on 01/07/20 and was in AF with RVR. Potassium also high so Inspra stopped. Dr. Olin Pia note also mentions chronotropic incompetence with exercise. Carvedilol cut back to 3.125 bid.  ? ?Follow up 7/22, stable NYHA II, volume good. Echo showed EF 20-25% , RV mildly reduced, no LV clot. ? ?Admitted 4/23 with AFL with RVR. Underwent DCCV to NSR and discharged home. Presented back to ED next day with a/c CHF. Given IV lasix with improvement in symptoms. Discharged home on Lasix 40 mg daily x 2 days, then PRN.  ? ?Today he returns for post hospital HF follow up. Overall feeling ok. Occasionally feels palpations and felt "woozy" yesterday. Breathing better, but not back to baseline. Denies abnormal bleeding, CP, dizziness, edema, or PND/Orthopnea.  Appetite ok. No fever or chills. Weight at home 205 pounds. Taking all medications. Off Jardiance with yeast infection in groin. ? ?Cardiac Studies: ?- Echo 11/16/20 EF 20-25% RV mildly reduced No LV clot  ? ?- Echo 8/20 EF 25-30% RV ok. Small apical thrombus. ? ?- [facility] 03/31/2017 ?Prox RCA lesion is 100% stenosed. Dist RCA lesion is 100% stenosed (retrograde filling from collaterals does not go beyond the distal vessel) -->faint collateral filling to the distal PL and PDA from septal perforators and a diffusely diseased AV groove circumflex ?LAV Groove lesion is 90% stenosed. ?Prox LAD-1 lesion is 95% stenosed. Prox LAD-2 lesion is 85% stenosed. Heavily calcified, tandem lesions in very tortuous segment of the vessel. ?Prox Cx to Mid Cx lesion is 70% stenosed. Heavily calcified ?There is severe left ventricular systolic dysfunction. The left ventricular ejection fraction is less than 25% by visual estimate. ?LV end diastolic pressure is severely elevated -prior to Impella insertion ?Hemodynamic findings consistent with moderate pulmonary hypertension. ?Successful Impella insertion with 3.5-3.6 LPM flow's ? ?- Echo 04/01/2017 EF 15-20%  ? ?- Echo 04/16/17 (post op) Improved to 25-30% on 04/16/17 ? ?- Echo 08/07/17: EF 35-40%, mod to severe MR ? ?Review of systems complete and found to be negative unless listed in HPI.  ? ?Past Medical History:  ?Diagnosis Date  ? AICD (automatic cardioverter/defibrillator) present   ? Arthritis   ? Bladder cancer ([place])   ? Borderline glaucoma   ? Chronic systolic (congestive) heart failure (HCC)   ? Coronary artery disease   ?  Diabetes mellitus type 2, controlled ([place]) ORAL MED  ? Essential hypertension   ? Heart murmur   ? Hepatitis   ? Medical history non-contributory   ? Paroxysmal A-fib ([place])   ? Paroxysmal A-fib ([place]) 08/20/2020  ? Peripheral vascular disease ([place])   ? Pneumonia   ? ?Current Outpatient Medications  ?Medication Sig Dispense Refill  ? atorvastatin (LIPITOR) 80 MG  tablet Take 1 tablet (80 mg total) by mouth daily. NEEDS FOLLOW UP APPOINTMENT FOR ANYMORE REFILLS 30 tablet 0  ? carvedilol (COREG) 3.125 MG tablet Take 1 tablet (3.125 mg total) by mouth 2 (two) times daily. NEEDS FOLLOW UP APPOINTMENT FOR ANYMORE REFILLS (Patient taking differently: Take 3.125 mg by mouth every evening.) 60 tablet 0  ? ELIQUIS 5 MG TABS tablet TAKE 1 TABLET TWICE A DAY 180 tablet 3  ? furosemide (LASIX) 40 MG tablet Take 40 mg by mouth as needed.    ? metFORMIN (GLUCOPHAGE-XR) 500 MG 24 hr tablet Take 1 tablet (500 mg total) by mouth daily with breakfast.    ? sacubitril-valsartan (ENTRESTO) 97-103 MG Take 1 tablet by mouth 2 (two) times daily. NEEDS FOLLOW UP APPOINTMENT FOR ANYMORE REFILLS 180 tablet 0  ? ?No current facility-administered medications for this encounter.  ? ?Allergies  ?Allergen Reactions  ? Spironolactone Other (See Comments)  ?  Painful gynecomastia  ? Niacin And Related   ? ?Social History  ? ?Socioeconomic History  ? Marital status: Married  ?  Spouse name: Not on file  ? Number of children: Not on file  ? Years of education: Not on file  ? Highest education level: Not on file  ?Occupational History  ? Occupation: retired  ?Tobacco Use  ? Smoking status: Former  ?  Packs/day: 1.00  ?  Years: 40.00  ?  Pack years: 40.00  ?  Types: Cigarettes  ?  Quit date: 03/31/2017  ?  Years since quitting: 4.3  ? Smokeless tobacco: Never  ?Vaping Use  ? Vaping Use: Never used  ?Substance and Sexual Activity  ? Alcohol use: Not Currently  ?  Comment: rarely  ? Drug use: No  ? Sexual activity: Not on file  ?Other Topics Concern  ? Not on file  ?Social History Narrative  ? Not on file  ? ?Social Determinants of Health  ? ?Financial Resource Strain: Not on file  ?Food Insecurity: Not on file  ?Transportation Needs: Not on file  ?Physical Activity: Not on file  ?Stress: Not on file  ?Social Connections: Not on file  ?Intimate Partner Violence: Not on file  ? ?Family History  ?Problem Relation  Age of Onset  ? Heart attack Sister   ? Heart disease Sister   ? ?BP 122/78   Pulse (!) 106   Wt 92.8 kg (204 lb 9.6 oz)   SpO2 97%   BMI 26.27 kg/m?  ? ?Wt Readings from Last 3 Encounters:  ?08/07/21 92.8 kg (204 lb 9.6 oz)  ?08/02/21 99.8 kg (220 lb)  ?11/16/20 92.7 kg (204 lb 6.4 oz)  ?  ?PHYSICAL EXAM: ?General:  NAD. No resp difficulty ?HEENT: Normal ?Neck: Supple. No JVD. Carotids 2+ bilat; no bruits. No lymphadenopathy or thryomegaly appreciated. ?Cor: PMI nondisplaced. Regular rate & rhythm. No rubs, gallops or murmurs. ?Lungs: Clear ?Abdomen: Soft, nontender, nondistended. No hepatosplenomegaly. No bruits or masses. Good bowel sounds. ?Extremities: No cyanosis, clubbing, rash, edema ?Neuro: Alert & oriented x 3, cranial nerves grossly intact. Moves all 4 extremities w/o difficulty. Affect pleasant. ? ?  ECG (personally reviewed): typical AFL 123 bpm  ? ?ReDs: 29% ? ?Device interrogation (personally reviewed): OptiVol recently elevated but now down, stable thoracic impedence, + recent AF, 1 hr daily activity, no VT. ? ?ASSESSMENT & PLAN: ?1. Chronic systolic CHF due to ICM  ?- EF 04/01/17 EF 15-20% -> Improved to 25-30% on 04/16/17 Post op CABG as below.  ?- Echo 08/07/17 read as EF 35-40% but Dr Haroldine Laws reviewed personally and EF 25-30% (reviewed with Dr. Meda Coffee who agrees) + laminated apical clot and moderate to severe MR. RV ok.  ?- Cardiac MRI 09/2017 EF 30%, laminated apical thrombus, normal RV size and function, LGE pattern suggests infarction in LAD territory, unlikely to improve with revascularization ?- Echo 8/20 EF 25-30% RV ok. Mild MR Small apical thrombus.  ?- Echo 11/16/20 EF 20-25% RV mildly reduced No LV clot Personally reviewed ?- S/p Medtronic ICD with Dr Caryl Comes 11/20/17 ?- Stable NYHA II-early III. Volume status stable, ReDs 29%.  ?- Continue carvedilol 3.125 bid. (could not tolerate higher dosing due to severe chronotropic incompetence with exercise HR in 80s).  ?- Continue Entresto 97/103  mg bid. ?- Continue Lasix 40 mg PRN weight gain/dyspnea. ?- Off Jardiance with yeast infection. ?- Off eplerenone due hyperkalemia (painful gynecomastia with spiro). ?- BMET and BNP today. ? ?2. Paroxysmal Atria

## 2021-08-07 ENCOUNTER — Encounter (HOSPITAL_COMMUNITY): Payer: Self-pay

## 2021-08-07 ENCOUNTER — Ambulatory Visit (HOSPITAL_COMMUNITY)
Admission: RE | Admit: 2021-08-07 | Discharge: 2021-08-07 | Disposition: A | Discharge status: Home / Self Care | Payer: Medicare Other | Source: Ambulatory Visit | Attending: Family Medicine | Admitting: Family Medicine

## 2021-08-07 VITALS — BP 122/78 | HR 106 | Wt 204.6 lb

## 2021-08-07 DIAGNOSIS — Z8551 Personal history of malignant neoplasm of bladder: Secondary | ICD-10-CM

## 2021-08-07 DIAGNOSIS — Z79899 Other long term (current) drug therapy: Secondary | ICD-10-CM | POA: Insufficient documentation

## 2021-08-07 DIAGNOSIS — Z87891 Personal history of nicotine dependence: Secondary | ICD-10-CM | POA: Insufficient documentation

## 2021-08-07 DIAGNOSIS — Z09 Encounter for follow-up examination after completed treatment for conditions other than malignant neoplasm: Secondary | ICD-10-CM | POA: Diagnosis not present

## 2021-08-07 DIAGNOSIS — R42 Dizziness and giddiness: Secondary | ICD-10-CM | POA: Insufficient documentation

## 2021-08-07 DIAGNOSIS — I5042 Chronic combined systolic (congestive) and diastolic (congestive) heart failure: Secondary | ICD-10-CM | POA: Diagnosis not present

## 2021-08-07 DIAGNOSIS — I340 Nonrheumatic mitral (valve) insufficiency: Secondary | ICD-10-CM | POA: Diagnosis not present

## 2021-08-07 DIAGNOSIS — E1122 Type 2 diabetes mellitus with diabetic chronic kidney disease: Secondary | ICD-10-CM | POA: Diagnosis not present

## 2021-08-07 DIAGNOSIS — Z951 Presence of aortocoronary bypass graft: Secondary | ICD-10-CM | POA: Diagnosis not present

## 2021-08-07 DIAGNOSIS — I480 Paroxysmal atrial fibrillation: Secondary | ICD-10-CM | POA: Insufficient documentation

## 2021-08-07 DIAGNOSIS — I5022 Chronic systolic (congestive) heart failure: Secondary | ICD-10-CM

## 2021-08-07 DIAGNOSIS — E1151 Type 2 diabetes mellitus with diabetic peripheral angiopathy without gangrene: Secondary | ICD-10-CM | POA: Diagnosis not present

## 2021-08-07 DIAGNOSIS — I252 Old myocardial infarction: Secondary | ICD-10-CM | POA: Insufficient documentation

## 2021-08-07 DIAGNOSIS — N1832 Chronic kidney disease, stage 3b: Secondary | ICD-10-CM | POA: Insufficient documentation

## 2021-08-07 DIAGNOSIS — Z86718 Personal history of other venous thrombosis and embolism: Secondary | ICD-10-CM | POA: Diagnosis not present

## 2021-08-07 DIAGNOSIS — I513 Intracardiac thrombosis, not elsewhere classified: Secondary | ICD-10-CM

## 2021-08-07 DIAGNOSIS — J449 Chronic obstructive pulmonary disease, unspecified: Secondary | ICD-10-CM | POA: Diagnosis not present

## 2021-08-07 DIAGNOSIS — Z720 Tobacco use: Secondary | ICD-10-CM

## 2021-08-07 DIAGNOSIS — I2510 Atherosclerotic heart disease of native coronary artery without angina pectoris: Secondary | ICD-10-CM | POA: Diagnosis not present

## 2021-08-07 DIAGNOSIS — I130 Hypertensive heart and chronic kidney disease with heart failure and stage 1 through stage 4 chronic kidney disease, or unspecified chronic kidney disease: Secondary | ICD-10-CM | POA: Insufficient documentation

## 2021-08-07 DIAGNOSIS — Z7901 Long term (current) use of anticoagulants: Secondary | ICD-10-CM | POA: Diagnosis not present

## 2021-08-07 DIAGNOSIS — I483 Typical atrial flutter: Secondary | ICD-10-CM | POA: Diagnosis not present

## 2021-08-07 DIAGNOSIS — N183 Chronic kidney disease, stage 3 unspecified: Secondary | ICD-10-CM

## 2021-08-07 LAB — BASIC METABOLIC PANEL
Anion gap: 7 (ref 5–15)
BUN: 30 mg/dL — ABNORMAL HIGH (ref 8–23)
CO2: 26 mmol/L (ref 22–32)
Calcium: 9.4 mg/dL (ref 8.9–10.3)
Chloride: 104 mmol/L (ref 98–111)
Creatinine, Ser: 1.85 mg/dL — ABNORMAL HIGH (ref 0.61–1.24)
GFR, Estimated: 38 mL/min — ABNORMAL LOW (ref >60)
Glucose, Bld: 172 mg/dL — ABNORMAL HIGH (ref 70–99)
Potassium: 5.1 mmol/L (ref 3.5–5.1)
Sodium: 137 mmol/L (ref 135–145)

## 2021-08-07 LAB — CBC
HCT: 51.1 % (ref 39.0–52.0)
Hemoglobin: 16.7 g/dL (ref 13.0–17.0)
MCH: 29.3 pg (ref 26.0–34.0)
MCHC: 32.7 g/dL (ref 30.0–36.0)
MCV: 89.8 fL (ref 80.0–100.0)
Platelets: 252 10*3/uL (ref 150–400)
RBC: 5.69 MIL/uL (ref 4.22–5.81)
RDW: 14.4 % (ref 11.5–15.5)
WBC: 8.8 10*3/uL (ref 4.0–10.5)
nRBC: 0 % (ref 0.0–0.2)

## 2021-08-07 LAB — BRAIN NATRIURETIC PEPTIDE: B Natriuretic Peptide: 955.3 pg/mL — ABNORMAL HIGH (ref 0.0–100.0)

## 2021-08-07 MED ORDER — AMIODARONE HCL 200 MG PO TABS
200.0000 mg | ORAL_TABLET | Freq: Two times a day (BID) | ORAL | 6 refills | Status: DC
Start: 2021-08-07 — End: 2021-08-28

## 2021-08-07 NOTE — Patient Instructions (Addendum)
Thank you for coming in today ? ?Labs were done today, if any labs are abnormal the clinic will call you ?No news is good news ? ?START Amiodarone 200 mg 1 tablet twice daily  ? ?Your physician recommends that you schedule a follow-up appointment in:  ?2 weeks after cardioversion ?3 months with Dr. Haroldine Laws please call in May to make appointment for July ? ?You are scheduled for a Cardioversion on 08/10/2021 with Dr. Haroldine Laws.  Please arrive at the [facility] (Main Entrance A) at [facility]: [address] [place], [place] [address] at 6:30am (1 hour prior to procedure unless lab work is needed; if lab work is needed arrive 1.5 hours ahead) ? ?DIET: Nothing to eat or drink after midnight except a sip of water with medications (see medication instructions below) ? ?Medication Instructions: ? ? ?Continue your anticoagulant: Eliquis  ?You will need to continue your anticoagulant after your procedure until you  are told by your  ?Provider that it is safe to stop ? ? ?Labs: If patient is on Coumadin, patient needs pt/INR, CBC, BMET within 3 days (No pt/INR needed for patients taking Xarelto, Eliquis, Pradaxa) ?For patients receiving anesthesia for TEE and all Cardioversion patients: BMET, CBC within 1 week ? ? ?You must have a responsible person to drive you home and stay in the waiting area during your procedure. Failure to do so could result in cancellation. ? ?Interior and spatial designer cards. ? ?*Special Note: Every effort is made to have your procedure done on time. Occasionally there are emergencies that occur at the hospital that may cause delays. Please be patient if a delay does occur.  ? ?

## 2021-08-07 NOTE — Progress Notes (Signed)
ReDS Vest / Clip - 08/07/21 1400   ? ?  ? ReDS Vest / Clip  ? Station Marker D   ? Ruler Value 32   ? ReDS Value Range Low volume   ? ReDS Actual Value 29   ? ?  ?  ? ?  ? ? ?

## 2021-08-09 ENCOUNTER — Encounter (HOSPITAL_COMMUNITY): Payer: Self-pay | Admitting: Internal Medicine

## 2021-08-09 ENCOUNTER — Other Ambulatory Visit (HOSPITAL_COMMUNITY): Payer: Self-pay

## 2021-08-09 DIAGNOSIS — I483 Typical atrial flutter: Secondary | ICD-10-CM

## 2021-08-09 NOTE — Anesthesia Preprocedure Evaluation (Addendum)
Anesthesia Evaluation  ?Patient identified by MRN, date of birth, ID band ?Patient awake ? ? ? ?Reviewed: ?Allergy & Precautions, NPO status , Patient's Chart, lab work & pertinent test results, reviewed documented beta blocker date and time  ? ?Airway ?Mallampati: I ? ?TM Distance: >3 FB ?Neck ROM: Full ? ? ? Dental ?no notable dental hx. ?(+) Teeth Intact, Dental Advisory Given, Caps ?  ?Pulmonary ?pneumonia, resolved, former smoker,  ?  ?Pulmonary exam normal ?breath sounds clear to auscultation ? ? ? ? ? ? Cardiovascular ?Exercise Tolerance: Poor ?hypertension, Pt. on medications and Pt. on home beta blockers ?pulmonary hypertension+ CAD, + Past MI, + CABG, + Peripheral Vascular Disease and +CHF  ?+ Cardiac Defibrillator ?+ Valvular Problems/Murmurs MR  ?Rhythm:Irregular Rate:Normal ? ?Hx/o Anterior wall STEMI 03/31/2017 ? ?CABG 2v. 10/31/2017 ? ?Hx/o ischemic CM ? ?Echo 11/16/2020 ?1. Left ventricular ejection fraction, by estimation, is 20 to 25%. The left ventricle has severely decreased function. The left ventricle demonstrates global hypokinesis. The left ventricular internal cavity size was mildly dilated. Left ventricular diastolic parameters are consistent with Grade II diastolic dysfunction (pseudonormalization).  ??2. Right ventricular systolic function is mildly reduced. The right ventricular size is mildly enlarged. There is normal pulmonary artery systolic pressure.  ??3. The mitral valve is normal in structure. Trivial mitral valve regurgitation. No evidence of mitral stenosis.  ??4. The aortic valve is normal in structure. There is moderate calcification of the aortic valve. Aortic valve regurgitation is not visualized. No aortic stenosis is present.  ??5. The inferior vena cava is normal in size with greater than 50% respiratory variability, suggesting right atrial pressure of 3 mmHg.  ? ?EKG 08/07/21 ?Atrial flutter with variable block rate 123/min ?Anteroseptal and  lateral MI ? ?Cardiac Cath 03/31/2017 ? Prox RCA lesion is 100% stenosed. Dist RCA lesion is 100% stenosed (retrograde filling from collaterals does not go beyond the distal vessel) -->faint collateral filling to the distal PL and PDA from septal perforators and a diffusely diseased AV groove circumflex ? LAV Groove lesion is 90% stenosed. ? Prox LAD-1 lesion is 95% stenosed. Prox LAD-2 lesion is 85% stenosed. Heavily calcified, tandem lesions in very tortuous segment of the vessel. ? Prox Cx to Mid Cx lesion is 70% stenosed. Heavily calcified ? There is severe left ventricular systolic dysfunction. The left ventricular ejection fraction is less than 25% by visual estimate. ? LV end diastolic pressure is severely elevated -prior to Impella insertion ? Hemodynamic findings consistent with moderate pulmonary hypertension. ? Successful Impella insertion with 3.5-3.6 LPM flow's ?  ?The patient has severe native coronary disease with an occluded RCA and extensive calcified disease in the LAD including 2 severe lesions that are not favorable for PCI based on the tortuosity, heavily calcified segment before and after the lesions.  Likely not to get a good outcome without atherectomy which is dangerous given the tortuosity. ?There is also diffuse calcified disease in the circumflex that appears to be at least 70%. ?? ?The patient has severe ischemic cardiomyopathy and presented in acute hypoxic respiratory failure with acute systolic and diastolic heart failure now stabilized on nasal cannula oxygen with Impella in place.  Relatively hemodynamically stable ? ?  ?Neuro/Psych ?negative neurological ROS ? negative psych ROS  ? GI/Hepatic ?negative GI ROS, (+) Hepatitis -  ?Endo/Other  ?diabetes, Poorly Controlled, Type 2, Oral Hypoglycemic AgentsHyperlipidemia ? Renal/GU ?Renal InsufficiencyRenal disease  ? ?Hx/o bladder Ca S/P resection ? ?  ?Musculoskeletal ? ?(+) Arthritis , Osteoarthritis,   ?  Abdominal ?  ?Peds ?  Hematology ?Eliquis therapy- last dose   ?Anesthesia Other Findings ? ? Reproductive/Obstetrics ? ?  ? ? ? ? ? ? ? ? ? ? ? ? ? ?  ?  ? ? ? ? ? ? ? ?Anesthesia Physical ?Anesthesia Plan ? ?ASA: 3 ? ?Anesthesia Plan: General  ? ?Post-op Pain Management: Minimal or no pain anticipated  ? ?Induction: Intravenous ? ?PONV Risk Score and Plan: 2 and Treatment may vary due to age or medical condition ? ?Airway Management Planned: Mask and Natural Airway ? ?Additional Equipment:  ? ?Intra-op Plan:  ? ?Post-operative Plan:  ? ?Informed Consent: I have reviewed the patients History and Physical, chart, labs and discussed the procedure including the risks, benefits and alternatives for the proposed anesthesia with the patient or authorized representative who has indicated his/her understanding and acceptance.  ? ? ? ?Dental advisory given ? ?Plan Discussed with: CRNA and Anesthesiologist ? ?Anesthesia Plan Comments: (Etomidate for procedure)  ? ? ? ? ?Anesthesia Quick Evaluation ? ?

## 2021-08-10 ENCOUNTER — Encounter (HOSPITAL_COMMUNITY)
Admission: RE | Disposition: A | Discharge status: Home / Self Care | Payer: Self-pay | Source: Home / Self Care | Attending: Internal Medicine

## 2021-08-10 ENCOUNTER — Ambulatory Visit (HOSPITAL_COMMUNITY): Payer: Medicare Other | Admitting: Anesthesiology

## 2021-08-10 ENCOUNTER — Encounter (HOSPITAL_COMMUNITY): Payer: Self-pay | Admitting: Internal Medicine

## 2021-08-10 ENCOUNTER — Other Ambulatory Visit: Payer: Self-pay

## 2021-08-10 ENCOUNTER — Ambulatory Visit (HOSPITAL_BASED_OUTPATIENT_CLINIC_OR_DEPARTMENT_OTHER): Payer: Medicare Other | Admitting: Anesthesiology

## 2021-08-10 ENCOUNTER — Ambulatory Visit (HOSPITAL_COMMUNITY)
Admission: RE | Admit: 2021-08-10 | Discharge: 2021-08-10 | Disposition: A | Discharge status: Home / Self Care | Payer: Medicare Other | Attending: Internal Medicine | Admitting: Internal Medicine

## 2021-08-10 DIAGNOSIS — Z7984 Long term (current) use of oral hypoglycemic drugs: Secondary | ICD-10-CM | POA: Diagnosis not present

## 2021-08-10 DIAGNOSIS — Z8551 Personal history of malignant neoplasm of bladder: Secondary | ICD-10-CM | POA: Diagnosis not present

## 2021-08-10 DIAGNOSIS — Z87891 Personal history of nicotine dependence: Secondary | ICD-10-CM | POA: Diagnosis not present

## 2021-08-10 DIAGNOSIS — I509 Heart failure, unspecified: Secondary | ICD-10-CM

## 2021-08-10 DIAGNOSIS — I5022 Chronic systolic (congestive) heart failure: Secondary | ICD-10-CM | POA: Insufficient documentation

## 2021-08-10 DIAGNOSIS — I483 Typical atrial flutter: Secondary | ICD-10-CM

## 2021-08-10 DIAGNOSIS — I4892 Unspecified atrial flutter: Secondary | ICD-10-CM

## 2021-08-10 DIAGNOSIS — I110 Hypertensive heart disease with heart failure: Secondary | ICD-10-CM

## 2021-08-10 DIAGNOSIS — Z95810 Presence of automatic (implantable) cardiac defibrillator: Secondary | ICD-10-CM | POA: Insufficient documentation

## 2021-08-10 DIAGNOSIS — J449 Chronic obstructive pulmonary disease, unspecified: Secondary | ICD-10-CM | POA: Insufficient documentation

## 2021-08-10 DIAGNOSIS — I255 Ischemic cardiomyopathy: Secondary | ICD-10-CM | POA: Insufficient documentation

## 2021-08-10 DIAGNOSIS — I2510 Atherosclerotic heart disease of native coronary artery without angina pectoris: Secondary | ICD-10-CM

## 2021-08-10 DIAGNOSIS — Z79899 Other long term (current) drug therapy: Secondary | ICD-10-CM | POA: Insufficient documentation

## 2021-08-10 DIAGNOSIS — I480 Paroxysmal atrial fibrillation: Secondary | ICD-10-CM | POA: Insufficient documentation

## 2021-08-10 DIAGNOSIS — I130 Hypertensive heart and chronic kidney disease with heart failure and stage 1 through stage 4 chronic kidney disease, or unspecified chronic kidney disease: Secondary | ICD-10-CM | POA: Diagnosis not present

## 2021-08-10 DIAGNOSIS — I340 Nonrheumatic mitral (valve) insufficiency: Secondary | ICD-10-CM | POA: Diagnosis not present

## 2021-08-10 DIAGNOSIS — Z7901 Long term (current) use of anticoagulants: Secondary | ICD-10-CM | POA: Insufficient documentation

## 2021-08-10 DIAGNOSIS — I252 Old myocardial infarction: Secondary | ICD-10-CM | POA: Insufficient documentation

## 2021-08-10 DIAGNOSIS — Z951 Presence of aortocoronary bypass graft: Secondary | ICD-10-CM | POA: Insufficient documentation

## 2021-08-10 DIAGNOSIS — N1832 Chronic kidney disease, stage 3b: Secondary | ICD-10-CM | POA: Insufficient documentation

## 2021-08-10 DIAGNOSIS — E1122 Type 2 diabetes mellitus with diabetic chronic kidney disease: Secondary | ICD-10-CM | POA: Diagnosis not present

## 2021-08-10 HISTORY — PX: CARDIOVERSION: SHX1299

## 2021-08-10 LAB — GLUCOSE, CAPILLARY: Glucose-Capillary: 111 mg/dL — ABNORMAL HIGH (ref 70–99)

## 2021-08-10 SURGERY — CARDIOVERSION
Anesthesia: General

## 2021-08-10 MED ORDER — SODIUM CHLORIDE 0.9 % IV SOLN: INTRAVENOUS | Status: DC

## 2021-08-10 MED ORDER — PROPOFOL 10 MG/ML IV BOLUS
INTRAVENOUS | Status: DC | PRN
  Administered 2021-08-10: 30 mg via INTRAVENOUS

## 2021-08-10 MED ORDER — SODIUM CHLORIDE 0.9 % IV SOLN
INTRAVENOUS | Status: AC | PRN
  Administered 2021-08-10: 500 mL via INTRAVENOUS

## 2021-08-10 MED ORDER — ETOMIDATE 2 MG/ML IV SOLN
INTRAVENOUS | Status: DC | PRN
  Administered 2021-08-10: 8 mg via INTRAVENOUS

## 2021-08-10 NOTE — Anesthesia Postprocedure Evaluation (Signed)
Anesthesia Post Note ? ?Patient: [name] [name] ? ?Procedure(s) Performed: CARDIOVERSION ? ?  ? ?Patient location during evaluation: PACU ?Anesthesia Type: General ?Level of consciousness: awake and alert and oriented ?Pain management: pain level controlled ?Vital Signs Assessment: post-procedure vital signs reviewed and stable ?Respiratory status: spontaneous breathing, nonlabored ventilation and respiratory function stable ?Cardiovascular status: blood pressure returned to baseline and stable ?Postop Assessment: no apparent nausea or vomiting ?Anesthetic complications: no ? ? ?No notable events documented. ? ?Last Vitals:  ?Vitals:  ? 08/10/21 0756 08/10/21 0806  ?BP: (!) 153/83 (!) 145/86  ?Pulse: 69 76  ?Resp: 14 19  ?Temp:    ?SpO2: 97% 98%  ?  ?Last Pain:  ?Vitals:  ? 08/10/21 0806  ?TempSrc:   ?PainSc: 0-No pain  ? ? ?  ?  ?  ?  ?  ?  ? ?Alvester Eads A. ? ? ? ? ?

## 2021-08-10 NOTE — CV Procedure (Signed)
? ?   DIRECT CURRENT CARDIOVERSION ? ?NAME:  [name] [name]   MRN: [id number] ?DOB:  [birth year]   ADMIT DATE: 08/10/2021 ? ? ?INDICATIONS: Atrial flutter ? ? ?PROCEDURE:  ? ?Informed consent was obtained prior to the procedure. The risks, benefits and alternatives for the procedure were discussed and the patient comprehended these risks. Once an appropriate time out was taken, the patient had the defibrillator pads placed in the anterior and posterior position. The patient then underwent sedation by the anesthesia service. Once an appropriate level of sedation was achieved, the patient received a biphasic, synchronized 150J shock with no conversion. We repeated the shock at 200J with manual pressure on the anterior pad and there was prompt conversion to sinus rhythm. No apparent complications. ? ?Glori Bickers, MD  ?7:44 AM  ?

## 2021-08-10 NOTE — Interval H&P Note (Signed)
History and Physical Interval Note: ? ?08/10/2021 ?7:43 AM ? ?[name] [name]  has presented today for surgery, with the diagnosis of a flutter.  The various methods of treatment have been discussed with the patient and family. After consideration of risks, benefits and other options for treatment, the patient has consented to  Procedure(s): ?CARDIOVERSION (N/A) as a surgical intervention.  The patient's history has been reviewed, patient examined, no change in status, stable for surgery.  I have reviewed the patient's chart and labs.  Questions were answered to the patient's satisfaction.   ? ? ?Angella Montas ? ? ?

## 2021-08-10 NOTE — Transfer of Care (Signed)
Immediate Anesthesia Transfer of Care Note ? ?Patient: [name] [name] ? ?Procedure(s) Performed: CARDIOVERSION ? ?Patient Location: PACU ? ?Anesthesia Type:General ? ?Level of Consciousness: drowsy ? ?Airway & Oxygen Therapy: Patient Spontanous Breathing and Patient connected to nasal cannula oxygen ? ?Post-op Assessment: Report given to RN and Post -op Vital signs reviewed and stable ? ?Post vital signs: Reviewed and stable ? ?Last Vitals:  ?Vitals Value Taken Time  ?BP 149/86 08/10/21 0745  ?Temp    ?Pulse 70 08/10/21 0746  ?Resp 20 08/10/21 0746  ?SpO2 99 % 08/10/21 0746  ?Vitals shown include unvalidated device data. ? ?Last Pain:  ?Vitals:  ? 08/10/21 0713  ?TempSrc:   ?PainSc: 0-No pain  ?   ? ?  ? ?Complications: No notable events documented. ?

## 2021-08-10 NOTE — Discharge Instructions (Signed)

## 2021-08-11 ENCOUNTER — Other Ambulatory Visit (HOSPITAL_COMMUNITY): Payer: Self-pay | Admitting: Internal Medicine

## 2021-08-11 ENCOUNTER — Encounter (HOSPITAL_COMMUNITY): Payer: Self-pay | Admitting: Internal Medicine

## 2021-08-19 NOTE — ED Provider Notes (Signed)
?[facility] ?Provider Note ? ? ?CSN: [id number] ?Arrival date & time: 08/02/21  0132 ? ?  ? ?History ? ?Chief Complaint  ?Patient presents with  ? Shortness of Breath  ? ? ?TORRES HARDENBROOK is a 76 y.o. male. ? ?Patient presents to the emergency department for evaluation of shortness of breath.  Patient was seen in the emergency department earlier and found to be in atrial flutter, cardioverted.  Patient continues to have shortness of breath since discharging. ? ? ?  ? ?Home Medications ?Prior to Admission medications   ?Medication Sig Start Date End Date Taking? Authorizing Provider  ?ELIQUIS 5 MG TABS tablet TAKE 1 TABLET TWICE A DAY 11/17/20  Yes Bensimhon, Shaune Pascal, MD  ?metFORMIN (GLUCOPHAGE-XR) 500 MG 24 hr tablet Take 1 tablet (500 mg total) by mouth daily with breakfast. 08/23/20  Yes Vann, Jessica U, DO  ?sacubitril-valsartan (ENTRESTO) 97-103 MG Take 1 tablet by mouth 2 (two) times daily. NEEDS FOLLOW UP APPOINTMENT FOR ANYMORE REFILLS 07/17/21  Yes Bensimhon, Shaune Pascal, MD  ?amiodarone (PACERONE) 200 MG tablet Take 1 tablet (200 mg total) by mouth 2 (two) times daily. 08/07/21   Rafael Bihari, FNP  ?atorvastatin (LIPITOR) 80 MG tablet Take 1 tablet (80 mg total) by mouth daily. 08/11/21   Bensimhon, Shaune Pascal, MD  ?carvedilol (COREG) 3.125 MG tablet Take 1 tablet (3.125 mg total) by mouth 2 (two) times daily with a meal. 08/11/21   Bensimhon, Shaune Pascal, MD  ?furosemide (LASIX) 40 MG tablet Take 40 mg by mouth daily as needed for fluid.    [provider]  ?   ? ?Allergies    ?Spironolactone and Niacin and related   ? ?Review of Systems   ?Review of Systems  ?Respiratory:  Positive for shortness of breath.   ? ?Physical Exam ?Updated Vital Signs ?BP (!) 155/93   Pulse 81   Temp 97.6 ?F (36.4 ?C) (Oral)   Resp (!) 42   Ht '6\' 2"'$  (1.88 m)   Wt 99.8 kg   SpO2 98%   BMI 28.25 kg/m?  ?Physical Exam ?Vitals and nursing note reviewed.  ?Constitutional:   ?    General: He is not in acute distress. ?   Appearance: He is well-developed.  ?HENT:  ?   Head: Normocephalic and atraumatic.  ?   Mouth/Throat:  ?   Mouth: Mucous membranes are moist.  ?Eyes:  ?   General: Vision grossly intact. Gaze aligned appropriately.  ?   Extraocular Movements: Extraocular movements intact.  ?   Conjunctiva/sclera: Conjunctivae normal.  ?Cardiovascular:  ?   Rate and Rhythm: Normal rate and regular rhythm.  ?   Pulses: Normal pulses.  ?   Heart sounds: Normal heart sounds, S1 normal and S2 normal. No murmur heard. ?  No friction rub. No gallop.  ?Pulmonary:  ?   Effort: Pulmonary effort is normal. No respiratory distress.  ?   Breath sounds: Rales present.  ?Abdominal:  ?   Palpations: Abdomen is soft.  ?   Tenderness: There is no abdominal tenderness. There is no guarding or rebound.  ?   Hernia: No hernia is present.  ?Musculoskeletal:     ?   General: No swelling.  ?   Cervical back: Full passive range of motion without pain, normal range of motion and neck supple. No pain with movement, spinous process tenderness or muscular tenderness. Normal range of motion.  ?   Right lower leg: Edema  present.  ?   Left lower leg: Edema present.  ?Skin: ?   General: Skin is warm and dry.  ?   Capillary Refill: Capillary refill takes less than 2 seconds.  ?   Findings: No ecchymosis, erythema, lesion or wound.  ?Neurological:  ?   Mental Status: He is alert and oriented to person, place, and time.  ?   GCS: GCS eye subscore is 4. GCS verbal subscore is 5. GCS motor subscore is 6.  ?   Cranial Nerves: Cranial nerves 2-12 are intact.  ?   Sensory: Sensation is intact.  ?   Motor: Motor function is intact. No weakness or abnormal muscle tone.  ?   Coordination: Coordination is intact.  ?Psychiatric:     ?   Mood and Affect: Mood normal.     ?   Speech: Speech normal.     ?   Behavior: Behavior normal.  ? ? ?ED Results / Procedures / Treatments   ?Labs ?(all labs ordered are listed, but only abnormal results  are displayed) ?Labs Reviewed  ?COMPREHENSIVE METABOLIC PANEL - Abnormal; Notable for the following components:  ?    Result Value  ? Sodium 134 (*)   ? CO2 18 (*)   ? Glucose, Bld 158 (*)   ? BUN 25 (*)   ? Creatinine, Ser 1.84 (*)   ? Total Bilirubin 1.8 (*)   ? GFR, Estimated 38 (*)   ? All other components within normal limits  ?BRAIN NATRIURETIC PEPTIDE - Abnormal; Notable for the following components:  ? B Natriuretic Peptide 524.7 (*)   ? All other components within normal limits  ?TROPONIN I (HIGH SENSITIVITY) - Abnormal; Notable for the following components:  ? Troponin I (High Sensitivity) 34 (*)   ? All other components within normal limits  ?TROPONIN I (HIGH SENSITIVITY) - Abnormal; Notable for the following components:  ? Troponin I (High Sensitivity) 34 (*)   ? All other components within normal limits  ?RESP PANEL BY RT-PCR (FLU A&B, COVID) ARPGX2  ?CBC WITH DIFFERENTIAL/PLATELET  ?CBC WITH DIFFERENTIAL/PLATELET  ? ? ?EKG ?EKG Interpretation ? ?Date/Time:  Wednesday August 02 2021 01:38:49 EDT ?Ventricular Rate:  102 ?PR Interval:  191 ?QRS Duration: 98 ?QT Interval:  345 ?QTC Calculation: 450 ?R Axis:   97 ?Text Interpretation: Sinus tachycardia Biatrial enlargement Anterior infarct, old Confirmed by Orpah Greek [phone]) on 08/02/2021 1:50:12 AM ? ?Radiology ?No results found. ? ?Procedures ?Procedures  ? ? ?Medications Ordered in ED ?Medications  ?furosemide (LASIX) injection 40 mg (40 mg Intravenous Given 08/02/21 0243)  ? ? ?ED Course/ Medical Decision Making/ A&P ?  ?                        ?Medical Decision Making ?Amount and/or Complexity of Data Reviewed ?Labs: ordered. ?Radiology: ordered. ? ?Risk ?Prescription drug management. ?Decision regarding hospitalization. ? ? ?Patient presented to the emergency department for evaluation of shortness of breath.  Patient with progressively worsening shortness of breath over time.  Patient seen in the ED earlier and diagnosed with atrial flutter,  cardioverted successfully to sinus rhythm.  Patient continued to have shortness of breath, swelling at home.  Current presentation consistent with volume overload, initiated diuresis and admit the patient. ? ? ? ? ? ? ? ?Final Clinical Impression(s) / ED Diagnoses ?CHF ? ? ?Rx / DC Orders ? ? ? ?  ?Orpah Greek, MD ?08/19/21 [phone] ? ?

## 2021-08-25 NOTE — Progress Notes (Signed)
?Advanced Heart Failure Clinic Note  ? ?PCP: Christain [place], MD ?HF Cardiologist: Dr. Haroldine Laws  ? ?HPI: ?[name] [name] is a 76 y.o. 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  ? ?Pt admitted in 12/18 with anterior STEMI and developed cardiogenic shock requiring Impella support. Urgent cath showed severe 3v CAD as below. While on impella, pt developed sepsis with WBC ~ 40K, UCx + pseudomonas. Treated for AECOPD and completed ABX therapy. BCx negative. Stabilized and underwent CABG x 2 with LIMA to LAD and SVG to OM on 04/08/2017. Pt transiently required milrinone but weaned prior to discharge. Required amiodarone for brief post op afib, but had no recurrence.  ? ?Echo 08/07/17 EF 35-40% laminated apical clot. (Dr.Bensimhon felt ~ 25-30%) and moderate to severe MR.  ? ?Cardiac MRI 09/2017 EF 30%, laminated apical thrombus, normal RV size and function, LGE pattern suggests infarction in LAD territory, unlikely to improve with revascularization ? ?S/p Medtronic ICD with Dr Caryl Comes 11/20/17. ? ?Saw Dr. Caryl Comes on 01/07/20 and was in AF with RVR. Potassium also high so Inspra stopped. Dr. Olin Pia note also mentions chronotropic incompetence with exercise. Carvedilol cut back to 3.125 bid.  ? ?Follow up 7/22, stable NYHA II, volume good. Echo showed EF 20-25% , RV mildly reduced, no LV clot. ? ?Admitted 4/23 with AFL with RVR. Underwent DCCV to [facility] and discharged home. Presented back to ED next day with a/c CHF. Given IV lasix with improvement in symptoms. Discharged home on Lasix 40 mg daily x 2 days, then PRN. Post hospital follow up, he was volume overloaded and back in AFL RVR. Amiodarone started and arranged for DCCV. ? ?S/p DCCV 08/10/21 to [facility]. ? ?Today he returns for post DCCV HF follow up. Overall feeling fine. Occasional positional dizziness but  no falls. No SOB walking on flat ground. Denies palpitations, CP, abnormal bleeding, edema, or PND/Orthopnea. Appetite ok. No fever or chills. Weight at home 210 pounds. Taking all medications. Takes Lasix about 3x/week. ? ?Cardiac Studies: ?- Echo 11/16/20 EF 20-25% RV mildly reduced No LV clot  ? ?- Echo 8/20 EF 25-30% RV ok. Small apical thrombus. ? ?- [facility] 03/31/2017 ?Prox RCA lesion is 100% stenosed. Dist RCA lesion is 100% stenosed (retrograde filling from collaterals does not go beyond the distal vessel) -->faint collateral filling to the distal PL and PDA from septal perforators and a diffusely diseased AV groove circumflex ?LAV Groove lesion is 90% stenosed. ?Prox LAD-1 lesion is 95% stenosed. Prox LAD-2 lesion is 85% stenosed. Heavily calcified, tandem lesions in very tortuous segment of the vessel. ?Prox Cx to Mid Cx lesion is 70% stenosed. Heavily calcified ?There is severe left ventricular systolic dysfunction. The left ventricular ejection fraction is less than 25% by visual estimate. ?LV end diastolic pressure is severely elevated -prior to Impella insertion ?Hemodynamic findings consistent with moderate pulmonary hypertension. ?Successful Impella insertion with 3.5-3.6 LPM flow's ? ?- Echo 04/01/2017 EF 15-20%  ? ?- Echo 04/16/17 (post op) Improved to 25-30% on 04/16/17 ? ?- Echo 08/07/17: EF 35-40%, mod to severe MR ? ?Review of systems complete and found to be negative unless listed in HPI.  ? ?Past Medical History:  ?Diagnosis Date  ? AICD (automatic cardioverter/defibrillator) present   ? Arthritis   ? Bladder cancer ([place])   ? Borderline glaucoma   ?  Chronic systolic (congestive) heart failure (HCC)   ? Coronary artery disease   ? Diabetes mellitus type 2, controlled ([place]) ORAL MED  ? Essential hypertension   ? Heart murmur   ? Hepatitis   ? Medical history non-contributory   ? Paroxysmal A-fib ([place])   ? Paroxysmal A-fib ([place]) 08/20/2020  ? Peripheral vascular disease ([place])   ? Pneumonia    ? ?Current Outpatient Medications  ?Medication Sig Dispense Refill  ? amiodarone (PACERONE) 200 MG tablet Take 1 tablet (200 mg total) by mouth 2 (two) times daily. 60 tablet 6  ? atorvastatin (LIPITOR) 80 MG tablet Take 1 tablet (80 mg total) by mouth daily. 90 tablet 3  ? carvedilol (COREG) 3.125 MG tablet Take 1 tablet (3.125 mg total) by mouth 2 (two) times daily with a meal. 180 tablet 3  ? ELIQUIS 5 MG TABS tablet TAKE 1 TABLET TWICE A DAY 180 tablet 3  ? furosemide (LASIX) 40 MG tablet Take 40 mg by mouth daily as needed for fluid.    ? metFORMIN (GLUCOPHAGE-XR) 500 MG 24 hr tablet Take 1 tablet (500 mg total) by mouth daily with breakfast.    ? sacubitril-valsartan (ENTRESTO) 97-103 MG Take 1 tablet by mouth 2 (two) times daily. NEEDS FOLLOW UP APPOINTMENT FOR ANYMORE REFILLS 180 tablet 0  ? ?No current facility-administered medications for this encounter.  ? ?Allergies  ?Allergen Reactions  ? Spironolactone Other (See Comments)  ?  Painful gynecomastia  ? Niacin And Related Hives  ? ?Social History  ? ?Socioeconomic History  ? Marital status: Married  ?  Spouse name: Not on file  ? Number of children: Not on file  ? Years of education: Not on file  ? Highest education level: Not on file  ?Occupational History  ? Occupation: retired  ?Tobacco Use  ? Smoking status: Former  ?  Packs/day: 1.00  ?  Years: 40.00  ?  Pack years: 40.00  ?  Types: Cigarettes  ?  Quit date: 03/31/2017  ?  Years since quitting: 4.4  ? Smokeless tobacco: Never  ?Vaping Use  ? Vaping Use: Never used  ?Substance and Sexual Activity  ? Alcohol use: Not Currently  ?  Comment: rarely  ? Drug use: No  ? Sexual activity: Not on file  ?Other Topics Concern  ? Not on file  ?Social History Narrative  ? Not on file  ? ?Social Determinants of Health  ? ?Financial Resource Strain: Not on file  ?Food Insecurity: Not on file  ?Transportation Needs: Not on file  ?Physical Activity: Not on file  ?Stress: Not on file  ?Social Connections: Not on file   ?Intimate Partner Violence: Not on file  ? ?Family History  ?Problem Relation Age of Onset  ? Heart attack Sister   ? Heart disease Sister   ? ?BP (!) 162/84   Pulse (!) 54   Ht '6\' 2"'$  (1.88 m)   Wt 95.5 kg (210 lb 9.6 oz)   SpO2 97%   BMI 27.04 kg/m?  ? ?Wt Readings from Last 3 Encounters:  ?08/28/21 95.5 kg (210 lb 9.6 oz)  ?08/10/21 92.8 kg (204 lb 9.4 oz)  ?08/07/21 92.8 kg (204 lb 9.6 oz)  ?  ?PHYSICAL EXAM: ?General:  NAD. No resp difficulty ?HEENT: Normal ?Neck: Supple. JVP 7-8. Carotids 2+ bilat; no bruits. No lymphadenopathy or thryomegaly appreciated. ?Cor: PMI nondisplaced. Brady rate & rhythm. No rubs, gallops or murmurs. ?Lungs: Clear ?Abdomen: Soft, nontender, nondistended. No hepatosplenomegaly. No bruits  or masses. Good bowel sounds. ?Extremities: No cyanosis, clubbing, rash, edema ?Neuro: Alert & oriented x 3, cranial nerves grossly intact. Moves all 4 extremities w/o difficulty. Affect pleasant. ? ?ECG (personally reviewed): SB with 1AVB PR 224 msec ? ?Device interrogation (personally reviewed): OptiVol trending up, stable thoracic below threshold, no further VT, 0.7% v-pacing, since 08/09/21, 1 hr daily activity, no VT. ? ?ASSESSMENT & PLAN: ?1. Chronic systolic CHF due to ICM  ?- EF 04/01/17 EF 15-20% -> Improved to 25-30% on 04/16/17 Post op CABG as below.  ?- Echo 08/07/17 read as EF 35-40% but Dr Haroldine Laws reviewed personally and EF 25-30% (reviewed with Dr. Meda Coffee who agrees) + laminated apical clot and moderate to severe MR. RV ok.  ?- Cardiac MRI 09/2017 EF 30%, laminated apical thrombus, normal RV size and function, LGE pattern suggests infarction in LAD territory, unlikely to improve with revascularization ?- Echo 8/20 EF 25-30% RV ok. Mild MR Small apical thrombus.  ?- Echo 11/16/20 EF 20-25% RV mildly reduced No LV clot  ?- S/p Medtronic ICD with Dr Caryl Comes 11/20/17 ?- Stable NYHA II. Volume status mildly up on exam and by OptiVol, weight up 6 lbs. ?- Change Lasix to 40 mg daily x 7 days,  then 40 mg on MWF. ?- Continue carvedilol 3.125 bid. (could not tolerate higher dosing due to severe chronotropic incompetence with exercise HR in 80s).  ?- Continue Entresto 97/103 mg bid. ?- Off Jardiance with yea

## 2021-08-28 ENCOUNTER — Ambulatory Visit (HOSPITAL_COMMUNITY)
Admission: RE | Admit: 2021-08-28 | Discharge: 2021-08-28 | Disposition: A | Discharge status: Home / Self Care | Payer: Medicare Other | Source: Ambulatory Visit | Attending: Family Medicine | Admitting: Family Medicine

## 2021-08-28 ENCOUNTER — Encounter: Payer: Self-pay | Admitting: Internal Medicine

## 2021-08-28 ENCOUNTER — Encounter (HOSPITAL_COMMUNITY): Payer: Self-pay

## 2021-08-28 ENCOUNTER — Ambulatory Visit (INDEPENDENT_AMBULATORY_CARE_PROVIDER_SITE_OTHER): Payer: Medicare Other | Admitting: Internal Medicine

## 2021-08-28 VITALS — BP 144/74 | HR 62 | Ht 74.0 in | Wt 210.0 lb

## 2021-08-28 VITALS — BP 162/84 | HR 54 | Ht 74.0 in | Wt 210.6 lb

## 2021-08-28 DIAGNOSIS — I130 Hypertensive heart and chronic kidney disease with heart failure and stage 1 through stage 4 chronic kidney disease, or unspecified chronic kidney disease: Secondary | ICD-10-CM | POA: Insufficient documentation

## 2021-08-28 DIAGNOSIS — Z7984 Long term (current) use of oral hypoglycemic drugs: Secondary | ICD-10-CM | POA: Diagnosis not present

## 2021-08-28 DIAGNOSIS — I255 Ischemic cardiomyopathy: Secondary | ICD-10-CM

## 2021-08-28 DIAGNOSIS — I5042 Chronic combined systolic (congestive) and diastolic (congestive) heart failure: Secondary | ICD-10-CM | POA: Diagnosis not present

## 2021-08-28 DIAGNOSIS — I4892 Unspecified atrial flutter: Secondary | ICD-10-CM | POA: Diagnosis not present

## 2021-08-28 DIAGNOSIS — Z951 Presence of aortocoronary bypass graft: Secondary | ICD-10-CM | POA: Insufficient documentation

## 2021-08-28 DIAGNOSIS — Z8551 Personal history of malignant neoplasm of bladder: Secondary | ICD-10-CM

## 2021-08-28 DIAGNOSIS — E1122 Type 2 diabetes mellitus with diabetic chronic kidney disease: Secondary | ICD-10-CM

## 2021-08-28 DIAGNOSIS — Z95810 Presence of automatic (implantable) cardiac defibrillator: Secondary | ICD-10-CM | POA: Diagnosis not present

## 2021-08-28 DIAGNOSIS — I480 Paroxysmal atrial fibrillation: Secondary | ICD-10-CM | POA: Diagnosis not present

## 2021-08-28 DIAGNOSIS — Z720 Tobacco use: Secondary | ICD-10-CM

## 2021-08-28 DIAGNOSIS — I2510 Atherosclerotic heart disease of native coronary artery without angina pectoris: Secondary | ICD-10-CM | POA: Diagnosis not present

## 2021-08-28 DIAGNOSIS — N183 Chronic kidney disease, stage 3 unspecified: Secondary | ICD-10-CM

## 2021-08-28 DIAGNOSIS — Z87891 Personal history of nicotine dependence: Secondary | ICD-10-CM | POA: Insufficient documentation

## 2021-08-28 DIAGNOSIS — N1832 Chronic kidney disease, stage 3b: Secondary | ICD-10-CM

## 2021-08-28 DIAGNOSIS — I5022 Chronic systolic (congestive) heart failure: Secondary | ICD-10-CM | POA: Insufficient documentation

## 2021-08-28 DIAGNOSIS — Z7901 Long term (current) use of anticoagulants: Secondary | ICD-10-CM | POA: Insufficient documentation

## 2021-08-28 DIAGNOSIS — I340 Nonrheumatic mitral (valve) insufficiency: Secondary | ICD-10-CM | POA: Diagnosis not present

## 2021-08-28 DIAGNOSIS — I483 Typical atrial flutter: Secondary | ICD-10-CM | POA: Diagnosis not present

## 2021-08-28 DIAGNOSIS — Z79899 Other long term (current) drug therapy: Secondary | ICD-10-CM | POA: Insufficient documentation

## 2021-08-28 DIAGNOSIS — I513 Intracardiac thrombosis, not elsewhere classified: Secondary | ICD-10-CM

## 2021-08-28 LAB — BASIC METABOLIC PANEL
Anion gap: 5 (ref 5–15)
BUN: 22 mg/dL (ref 8–23)
CO2: 25 mmol/L (ref 22–32)
Calcium: 9.3 mg/dL (ref 8.9–10.3)
Chloride: 105 mmol/L (ref 98–111)
Creatinine, Ser: 1.88 mg/dL — ABNORMAL HIGH (ref 0.61–1.24)
GFR, Estimated: 37 mL/min — ABNORMAL LOW (ref >60)
Glucose, Bld: 152 mg/dL — ABNORMAL HIGH (ref 70–99)
Potassium: 5 mmol/L (ref 3.5–5.1)
Sodium: 135 mmol/L (ref 135–145)

## 2021-08-28 LAB — BRAIN NATRIURETIC PEPTIDE: B Natriuretic Peptide: 597.1 pg/mL — ABNORMAL HIGH (ref 0.0–100.0)

## 2021-08-28 MED ORDER — FUROSEMIDE 40 MG PO TABS: 40.0000 mg | ORAL_TABLET | ORAL | 2 refills | Status: DC

## 2021-08-28 MED ORDER — AMIODARONE HCL 200 MG PO TABS: 200.0000 mg | ORAL_TABLET | Freq: Every day | ORAL | 5 refills | Status: DC

## 2021-08-28 NOTE — Progress Notes (Signed)
? ? ? ? ?Patient Care Team: ?Christain Sacramento, MD as PCP - General (Family Medicine) ?Bensimhon, Shaune Pascal, MD as PCP - Advanced Heart Failure (Cardiology) ?Bensimhon, Shaune Pascal, MD as PCP - Cardiology (Cardiology) ?Deboraha Sprang, MD as PCP - Electrophysiology (Cardiology) ? ? ?HPI ? ?[name] [name] is a 76 y.o. male seen in followup for ICD implanted for primary prevention 7/19.  Has Ischemic cardiomyopathy having presented 12/18 with anterior MI and shock requiring impella support.  Further complicated by sepsis.  S/P CABG with postop afib  ?  ?He was found to have a laminated LV thrombus.  He has been treated with apixaban. ? ?08/07/2021 seen in ER for acute congestive failure found to be in atrial flutter (ECG reviewed and was atypical) and was cardioverted put on a diuretic and discharged.  He was seen by heart failure 08/10/2021 had redeveloped atrial flutter and was cardioverted again (ECG is not available) he was started on amiodarone ? ?He feels much better.  No chest pain.  Little shortness of breath.  No nocturnal dyspnea orthopnea.  Does have intermittent edema that requires him to continue on every other day furosemide.  Has no palpitations and did have no palpitations with these events. ? ?  ? ?  ? ? ?DATE TEST EF    ?12/18 LHC  25 % RCA T; LAD 95; Cx 70%  ?4/19 Echo  25-30% Mod-sev MR  ?6/19 cMRI 30%    ?8/20 Echo  25-30%   ?7/22 Echo  20-25%   ?     ?     ?  ?Date Cr K Hgb  ?7/19 1.18 4.7 12.5  ?8/20  1.41 4.9<<5.3 15.7  ?10/21 1.41 4.9 16.1  ?5/23 1.88 5.0 16.7  ?Thromboembolic risk factors ( age  -2, HTN-1, DM-1, Vasc disease -1, CHF-1) for a CHADSVASc Score of >=6  ? ? ? ? Records and Results Reviewed  ? ?Past Medical History:  ?Diagnosis Date  ? AICD (automatic cardioverter/defibrillator) present   ? Arthritis   ? Bladder cancer ([place])   ? Borderline glaucoma   ? Chronic systolic (congestive) heart failure (HCC)   ? Coronary artery disease   ? Diabetes mellitus type 2, controlled ([place]) ORAL MED  ?  Essential hypertension   ? Heart murmur   ? Hepatitis   ? Medical history non-contributory   ? Paroxysmal A-fib ([place])   ? Paroxysmal A-fib ([place]) 08/20/2020  ? Peripheral vascular disease ([place])   ? Pneumonia   ? ? ?Past Surgical History:  ?Procedure Laterality Date  ? CARDIOVERSION N/A 08/10/2021  ? Procedure: CARDIOVERSION;  Surgeon: Jolaine Artist, MD;  Location: [place];  Service: Cardiovascular;  Laterality: N/A;  ? CAROTID DUPLEX SCAN  07-26-2010  ? BILATERAL ICA  STENOSIS 1% - 39%  ? CHOLECYSTECTOMY N/A 08/22/2020  ? Procedure: LAPAROSCOPIC CHOLECYSTECTOMY;  Surgeon: Rolm Bookbinder, MD;  Location: [place];  Service: General;  Laterality: N/A;  ? CORONARY ARTERY BYPASS GRAFT N/A 04/08/2017  ? Procedure: CORONARY ARTERY BYPASS GRAFTING (CABG) TIMES TWO USING LEFT INTERNAL MAMMARY ARTERY AND LEFT SAPHENOUS LEG VEIN HARVESTED ENDOSCOPICALLY.  LEG VEIN ALSO HARVESTED FROM THE RIGHT LEG;  Surgeon: [name], MD;  Location: [place] [facility];  Service: Open Heart Surgery;  Laterality: N/A;  ? CYSTOSCOPY WITH BIOPSY N/A 08/01/2012  ? Procedure: CYSTOSCOPY WITH BIOPSY BLADDER BIOPSY   ;  Surgeon: Fredricka Bonine, MD;  Location[facility];  Service: Urology;  Laterality: N/A;  ?  ERCP N/A 08/21/2020  ? Procedure: ENDOSCOPIC RETROGRADE CHOLANGIOPANCREATOGRAPHY (ERCP);  Surgeon: Carol Ada, MD;  Location: [place];  Service: Endoscopy;  Laterality: N/A;  ? FULGURATION OF BLADDER TUMOR N/A 08/01/2012  ? Procedure: FULGURATION OF BLADDER TUMOR;  Surgeon: Fredricka Bonine, MD;  Location: [facility];  Service: Urology;  Laterality: N/A;  ? ICD IMPLANT  11/20/2017  ? ICD IMPLANT N/A 11/20/2017  ? Procedure: ICD IMPLANT;  Surgeon: Deboraha Sprang, MD;  Location: [place] [facility];  Service: Cardiovascular;  Laterality: N/A;  ? LEFT HEART CATH AND CORONARY ANGIOGRAPHY N/A 03/31/2017  ? Procedure: LEFT HEART CATH AND CORONARY ANGIOGRAPHY;  Surgeon: Leonie Man, MD;   Location: [place] [facility];  Service: Cardiovascular;  Laterality: N/A;  ? LUMBAR LAMINECTOMY/DECOMPRESSION MICRODISCECTOMY Bilateral 06/16/2019  ? Procedure: Bilateral Lumbar Four-Five Laminectomy and Foraminotomy;  Surgeon: Earnie Larsson, MD;  Location: [place];  Service: Neurosurgery;  Laterality: Bilateral;  posterior  ? REMOVAL OF STONES  08/21/2020  ? Procedure: REMOVAL OF STONES;  Surgeon: Carol Ada, MD;  Location: [place];  Service: Endoscopy;;  ? RIGHT HEART CATH Right 03/31/2017  ? Procedure: RIGHT HEART CATH;  Surgeon: Leonie Man, MD;  Location: [place] [facility];  Service: Cardiovascular;  Laterality: Right;  ? SPHINCTEROTOMY  08/21/2020  ? Procedure: SPHINCTEROTOMY;  Surgeon: Carol Ada, MD;  Location: [place];  Service: Endoscopy;;  ? TEE WITHOUT CARDIOVERSION N/A 04/08/2017  ? Procedure: TRANSESOPHAGEAL ECHOCARDIOGRAM (TEE);  Surgeon: [name], MD;  Location: [place];  Service: Open Heart Surgery;  Laterality: N/A;  ? TRANSURETHRAL RESECTION OF BLADDER TUMOR  05/29/2011  ? Procedure: TRANSURETHRAL RESECTION OF BLADDER TUMOR (TURBT);  Surgeon: Fredricka Bonine, MD;  Location: [facility];  Service: Urology;  Laterality: N/A;  ? VENTRICULAR ASSIST DEVICE INSERTION Right 03/31/2017  ? Procedure: VENTRICULAR ASSIST DEVICE INSERTION;  Surgeon: Leonie Man, MD;  Location: [place] [facility];  Service: Cardiovascular;  Laterality: Right;  ? ? ?Current Meds  ?Medication Sig  ? amiodarone (PACERONE) 200 MG tablet Take 1 tablet (200 mg total) by mouth daily.  ? atorvastatin (LIPITOR) 80 MG tablet Take 1 tablet (80 mg total) by mouth daily.  ? carvedilol (COREG) 3.125 MG tablet Take 1 tablet (3.125 mg total) by mouth 2 (two) times daily with a meal.  ? ELIQUIS 5 MG TABS tablet TAKE 1 TABLET TWICE A DAY  ? furosemide (LASIX) 40 MG tablet Take 1 tablet (40 mg total) by mouth 3 (three) times a week. On Mondays, Wednesdays and fridays  ? metFORMIN (GLUCOPHAGE-XR) 500  MG 24 hr tablet Take 1 tablet (500 mg total) by mouth daily with breakfast.  ? sacubitril-valsartan (ENTRESTO) 97-103 MG Take 1 tablet by mouth 2 (two) times daily. NEEDS FOLLOW UP APPOINTMENT FOR ANYMORE REFILLS  ? ? ?Allergies  ?Allergen Reactions  ? Spironolactone Other (See Comments)  ?  Painful gynecomastia  ? Niacin And Related Hives  ? ? ? ? ?Review of Systems negative except from HPI and PMH ? ?Physical Exam ?BP (!) 144/74   Pulse 62   Ht '6\' 2"'$  (1.88 m)   Wt 210 lb (95.3 kg)   SpO2 95%   BMI 26.96 kg/m?  ?Well developed and well nourished in no acute distress ?HENT normal ?Neck supple with JVP-flat ?Clear ?Device pocket well healed; without hematoma or erythema.  There is no tethering  ?Regular rate and rhythm, no  gallop No  murmur ?Abd-soft with active BS ?  No Clubbing cyanosis  edema ?Skin-warm and dry ?A & Oriented  Grossly normal sensory and motor function ? ?ECG sinus @ 53 ?21/11/48  ?  ? ?Assessment and  Plan ?Ischemic cardiomyopathy status post CABG ?  ?Congestive heart failure-class II ? ?Implantable defibrillator-single-chamber-[employer]tronic ?  ?Atrial fibrillation-persistent-recurrent ?  ?Laminated clot (still present MRI) ? ?Sinus bradycardia ? ?The patient has had 2 cardioversions within the span of a week last month.  ECG was recorded for 1 of those and was an atypical flutter.  According to his device he has had a number of other episodes over the last 12 months increasingly frequent and of increasingly long duration.  He is currently on amiodarone which is I think a great suppressing drug but in no longer term and is relatively young 76 year old guy would favor a consideration of catheter ablation for his flutter as well as possibly for his pulmonary veins.  I will reach out to Dr. Reine Just to discuss this with him; for now, we will continue the amiodarone.  He is consigned to Eliquis long-term for his clot. ? ?No angina. ? ?Euvolemic.  We will continue with furosemide every other day. ? ?Not able  to take spironolactone because of gynecomastia; continue Entresto.  Carvedilol is limited by bradycardia.  I wonder if he is a candidate for an SGLT2. ? ? ? ?  ?[name] [name] ? ?

## 2021-08-28 NOTE — Patient Instructions (Signed)
Medication Instructions:  Your physician recommends that you continue on your current medications as directed. Please refer to the Current Medication list given to you today.  *If you need a refill on your cardiac medications before your next appointment, please call your pharmacy*   Lab Work: None ordered.  If you have labs (blood work) drawn today and your tests are completely normal, you will receive your results only by: MyChart Message (if you have MyChart) OR A paper copy in the mail If you have any lab test that is abnormal or we need to change your treatment, we will call you to review the results.   Testing/Procedures: None ordered.    Follow-Up: At [facility], you and your health needs are our priority.  As part of our continuing mission to provide you with exceptional heart care, we have created designated Provider Care Teams.  These Care Teams include your primary Cardiologist (physician) and Advanced Practice Providers (APPs -  Physician Assistants and Nurse Practitioners) who all work together to provide you with the care you need, when you need it.  We recommend signing up for the patient portal called "MyChart".  Sign up information is provided on this After Visit Summary.  MyChart is used to connect with patients for Virtual Visits (Telemedicine).  Patients are able to view lab/test results, encounter notes, upcoming appointments, etc.  Non-urgent messages can be sent to your provider as well.   To learn more about what you can do with MyChart, go to [email or web address].    Your next appointment:   To be determined  Important Information About Sugar        

## 2021-08-28 NOTE — Patient Instructions (Addendum)
EKG done today. ? ?Labs done today. We will contact you only if your labs are abnormal. ? ?START Lasix '40mg'$  (1 tablet) by mouth daily for 1 week THEN DECREASE to Mondays,Wednesdays and Fridays. ? ?DECREASE Amiodarone to '200mg'$  (1 tablet) by mouth daily.  ? ?No other medication changes were made. Please continue all current medications as prescribed. ? ?Your physician recommends that you schedule a follow-up appointment in: 3-4 months with Dr. Haroldine Laws ? ?If you have any questions or concerns before your next appointment please send [place] a message through [place] or call our office at [phone].   ? ?TO LEAVE A MESSAGE FOR THE NURSE SELECT OPTION 2, PLEASE LEAVE A MESSAGE INCLUDING: ?YOUR NAME ?DATE OF BIRTH ?CALL BACK NUMBER ?REASON FOR CALL**this is important as we prioritize the call backs ? ?YOU WILL RECEIVE A CALL BACK THE SAME DAY AS LONG AS YOU CALL BEFORE 4:00 PM ? ? ?Do the following things EVERYDAY: ?Weigh yourself in the morning before breakfast. Write it down and keep it in a log. ?Take your medicines as prescribed ?Eat low salt foods--Limit salt (sodium) to 2000 mg per day.  ?Stay as active as you can everyday ?Limit all fluids for the day to less than 2 liters ? ? ?At the [facility], you and your health needs are our priority. As part of our continuing mission to provide you with exceptional heart care, we have created designated Provider Care Teams. These Care Teams include your primary Cardiologist (physician) and Advanced Practice Providers (APPs- Physician Assistants and Nurse Practitioners) who all work together to provide you with the care you need, when you need it.  ? ?You may see any of the following providers on your designated Care Team at your next follow up: ?Dr Glori Bickers ?Dr Loralie Champagne ?Darrick Grinder, NP ?Lyda Jester, PA ?Audry Riles, PharmD ? ? ?Please be sure to bring in all your medications bottles to every appointment.  ? ?

## 2021-09-05 ENCOUNTER — Ambulatory Visit (INDEPENDENT_AMBULATORY_CARE_PROVIDER_SITE_OTHER): Payer: Medicare Other

## 2021-09-05 DIAGNOSIS — I255 Ischemic cardiomyopathy: Secondary | ICD-10-CM | POA: Diagnosis not present

## 2021-09-06 ENCOUNTER — Ambulatory Visit (INDEPENDENT_AMBULATORY_CARE_PROVIDER_SITE_OTHER): Payer: Medicare Other

## 2021-09-06 ENCOUNTER — Telehealth: Payer: Self-pay

## 2021-09-06 DIAGNOSIS — I5042 Chronic combined systolic (congestive) and diastolic (congestive) heart failure: Secondary | ICD-10-CM

## 2021-09-06 DIAGNOSIS — Z95810 Presence of automatic (implantable) cardiac defibrillator: Secondary | ICD-10-CM

## 2021-09-06 NOTE — Progress Notes (Signed)
EPIC Encounter for ICM Monitoring ? ?Patient Name: [name] [name] is a 76 y.o. male ?Date: 09/06/2021 ?Primary Care Physican: Christain Sacramento, MD ?Primary Cardiologist: HF Clinic ?Electrophysiologist: Caryl Comes ?     ? ?Heart Failure questions reviewed.  Pt is feeling better since taking Furosemide as prescribed by HF clinic.  Denies any shortness of breath ?  ?Optivol thoracic impedance suggesting fluid levels trending close to normal.   ? ?Prescribed:  ?Furosemide 40 mg take 1 tablet(s) (40 mg total) by mouth three times a week. ? ?Recommendations: No changes and encouraged to call if experiencing any fluid symptoms.    ? ?Follow-up plan: No further ICM clinic phone appointment scheduled.   91 day device clinic remote transmission 12/05/2021.   ? ?EP/Cardiology Office Visits: 01/04/2022 with Dr. Haroldine Laws.   ? ?Copy of ICM check sent to Dr. Caryl Comes.  Copy sent to [facility], NP following 5/1 OV.   ? ?3 month ICM trend: 09/06/2021. ? ? ? ?12-14 Month ICM trend:  ? ? ? ?Rosalene Billings, RN ?09/06/2021 ?5:19 PM ? ?

## 2021-09-06 NOTE — Telephone Encounter (Signed)
Spoke with patient and explained reason for call.  [place], NP at [facility] clinic would like a recheck of fluid levels following 5/1 OV.  Assisted in sending remote transmission.  See ICM note for results.   ?

## 2021-09-07 LAB — CUP PACEART REMOTE DEVICE CHECK
Battery Remaining Longevity: 87 mo
Battery Voltage: 2.97 V
Brady Statistic RV Percent Paced: 0.04 %
Date Time Interrogation Session: 20230510172525
HighPow Impedance: 69 Ohm
Implantable Lead Implant Date: 20190724
Implantable Lead Location: 753860
Implantable Pulse Generator Implant Date: 20190724
Lead Channel Impedance Value: 323 Ohm
Lead Channel Impedance Value: 380 Ohm
Lead Channel Pacing Threshold Amplitude: 0.75 V
Lead Channel Pacing Threshold Pulse Width: 0.4 ms
Lead Channel Sensing Intrinsic Amplitude: 15.125 mV
Lead Channel Sensing Intrinsic Amplitude: 15.125 mV
Lead Channel Setting Pacing Amplitude: 2.5 V
Lead Channel Setting Pacing Pulse Width: 0.4 ms
Lead Channel Setting Sensing Sensitivity: 0.3 mV

## 2021-09-08 ENCOUNTER — Other Ambulatory Visit: Payer: Self-pay | Admitting: Internal Medicine

## 2021-09-12 ENCOUNTER — Encounter: Payer: Medicare Other | Admitting: Internal Medicine

## 2021-09-19 NOTE — Progress Notes (Signed)
Remote ICD transmission.   

## 2021-10-03 ENCOUNTER — Other Ambulatory Visit (HOSPITAL_COMMUNITY): Payer: Self-pay | Admitting: Internal Medicine

## 2021-11-14 ENCOUNTER — Other Ambulatory Visit (HOSPITAL_COMMUNITY): Payer: Self-pay | Admitting: Family Medicine

## 2021-12-05 ENCOUNTER — Ambulatory Visit (INDEPENDENT_AMBULATORY_CARE_PROVIDER_SITE_OTHER): Payer: Medicare Other

## 2021-12-05 DIAGNOSIS — I255 Ischemic cardiomyopathy: Secondary | ICD-10-CM

## 2021-12-05 LAB — CUP PACEART REMOTE DEVICE CHECK
Battery Remaining Longevity: 85 mo
Battery Voltage: 3 V
Brady Statistic RV Percent Paced: 0.4 %
Date Time Interrogation Session: 20230808033332
HighPow Impedance: 82 Ohm
Implantable Lead Implant Date: 20190724
Implantable Lead Location: 753860
Implantable Pulse Generator Implant Date: 20190724
Lead Channel Impedance Value: 323 Ohm
Lead Channel Impedance Value: 380 Ohm
Lead Channel Pacing Threshold Amplitude: 0.75 V
Lead Channel Pacing Threshold Pulse Width: 0.4 ms
Lead Channel Sensing Intrinsic Amplitude: 13.75 mV
Lead Channel Sensing Intrinsic Amplitude: 13.75 mV
Lead Channel Setting Pacing Amplitude: 2.5 V
Lead Channel Setting Pacing Pulse Width: 0.4 ms
Lead Channel Setting Sensing Sensitivity: 0.3 mV

## 2022-01-04 ENCOUNTER — Other Ambulatory Visit (HOSPITAL_COMMUNITY): Payer: Self-pay

## 2022-01-04 ENCOUNTER — Encounter (HOSPITAL_COMMUNITY): Payer: Self-pay | Admitting: Internal Medicine

## 2022-01-04 ENCOUNTER — Ambulatory Visit (HOSPITAL_COMMUNITY)
Admission: RE | Admit: 2022-01-04 | Discharge: 2022-01-04 | Disposition: A | Discharge status: Home / Self Care | Payer: Medicare Other | Source: Ambulatory Visit | Attending: Internal Medicine | Admitting: Internal Medicine

## 2022-01-04 ENCOUNTER — Ambulatory Visit (HOSPITAL_BASED_OUTPATIENT_CLINIC_OR_DEPARTMENT_OTHER)
Admission: RE | Admit: 2022-01-04 | Discharge: 2022-01-04 | Disposition: A | Discharge status: Home / Self Care | Payer: Medicare Other | Source: Ambulatory Visit | Attending: Internal Medicine | Admitting: Internal Medicine

## 2022-01-04 ENCOUNTER — Other Ambulatory Visit: Payer: Self-pay

## 2022-01-04 VITALS — BP 148/80 | HR 60 | Wt 216.0 lb

## 2022-01-04 DIAGNOSIS — E1122 Type 2 diabetes mellitus with diabetic chronic kidney disease: Secondary | ICD-10-CM | POA: Diagnosis not present

## 2022-01-04 DIAGNOSIS — I480 Paroxysmal atrial fibrillation: Secondary | ICD-10-CM

## 2022-01-04 DIAGNOSIS — Z7984 Long term (current) use of oral hypoglycemic drugs: Secondary | ICD-10-CM | POA: Insufficient documentation

## 2022-01-04 DIAGNOSIS — R42 Dizziness and giddiness: Secondary | ICD-10-CM | POA: Diagnosis not present

## 2022-01-04 DIAGNOSIS — I130 Hypertensive heart and chronic kidney disease with heart failure and stage 1 through stage 4 chronic kidney disease, or unspecified chronic kidney disease: Secondary | ICD-10-CM | POA: Insufficient documentation

## 2022-01-04 DIAGNOSIS — Z951 Presence of aortocoronary bypass graft: Secondary | ICD-10-CM | POA: Insufficient documentation

## 2022-01-04 DIAGNOSIS — I252 Old myocardial infarction: Secondary | ICD-10-CM | POA: Diagnosis not present

## 2022-01-04 DIAGNOSIS — F17210 Nicotine dependence, cigarettes, uncomplicated: Secondary | ICD-10-CM | POA: Insufficient documentation

## 2022-01-04 DIAGNOSIS — I080 Rheumatic disorders of both mitral and aortic valves: Secondary | ICD-10-CM | POA: Insufficient documentation

## 2022-01-04 DIAGNOSIS — I5022 Chronic systolic (congestive) heart failure: Secondary | ICD-10-CM | POA: Insufficient documentation

## 2022-01-04 DIAGNOSIS — Z7901 Long term (current) use of anticoagulants: Secondary | ICD-10-CM | POA: Diagnosis not present

## 2022-01-04 DIAGNOSIS — E1151 Type 2 diabetes mellitus with diabetic peripheral angiopathy without gangrene: Secondary | ICD-10-CM | POA: Insufficient documentation

## 2022-01-04 DIAGNOSIS — R55 Syncope and collapse: Secondary | ICD-10-CM | POA: Diagnosis not present

## 2022-01-04 DIAGNOSIS — I4892 Unspecified atrial flutter: Secondary | ICD-10-CM

## 2022-01-04 DIAGNOSIS — I5042 Chronic combined systolic (congestive) and diastolic (congestive) heart failure: Secondary | ICD-10-CM

## 2022-01-04 DIAGNOSIS — Z79899 Other long term (current) drug therapy: Secondary | ICD-10-CM | POA: Insufficient documentation

## 2022-01-04 DIAGNOSIS — E875 Hyperkalemia: Secondary | ICD-10-CM | POA: Diagnosis not present

## 2022-01-04 DIAGNOSIS — I2510 Atherosclerotic heart disease of native coronary artery without angina pectoris: Secondary | ICD-10-CM | POA: Diagnosis not present

## 2022-01-04 DIAGNOSIS — I255 Ischemic cardiomyopathy: Secondary | ICD-10-CM

## 2022-01-04 DIAGNOSIS — Z8551 Personal history of malignant neoplasm of bladder: Secondary | ICD-10-CM | POA: Insufficient documentation

## 2022-01-04 DIAGNOSIS — N183 Chronic kidney disease, stage 3 unspecified: Secondary | ICD-10-CM

## 2022-01-04 DIAGNOSIS — N1832 Chronic kidney disease, stage 3b: Secondary | ICD-10-CM | POA: Diagnosis present

## 2022-01-04 LAB — COMPREHENSIVE METABOLIC PANEL
ALT: 26 U/L (ref 0–44)
AST: 25 U/L (ref 15–41)
Albumin: 4 g/dL (ref 3.5–5.0)
Alkaline Phosphatase: 70 U/L (ref 38–126)
Anion gap: 12 (ref 5–15)
BUN: 27 mg/dL — ABNORMAL HIGH (ref 8–23)
CO2: 23 mmol/L (ref 22–32)
Calcium: 9.5 mg/dL (ref 8.9–10.3)
Chloride: 105 mmol/L (ref 98–111)
Creatinine, Ser: 2.15 mg/dL — ABNORMAL HIGH (ref 0.61–1.24)
GFR, Estimated: 31 mL/min — ABNORMAL LOW (ref >60)
Glucose, Bld: 176 mg/dL — ABNORMAL HIGH (ref 70–99)
Potassium: 5.1 mmol/L (ref 3.5–5.1)
Sodium: 140 mmol/L (ref 135–145)
Total Bilirubin: 1 mg/dL (ref 0.3–1.2)
Total Protein: 7 g/dL (ref 6.5–8.1)

## 2022-01-04 LAB — ECHOCARDIOGRAM COMPLETE
Calc EF: 37.3 %
MV M vel: 4.61 m/s
MV Peak grad: 85 mmHg
Radius: 0.5 cm
S' Lateral: 4.1 cm
Single Plane A2C EF: 31.3 %
Single Plane A4C EF: 39.2 %

## 2022-01-04 LAB — CBC
HCT: 49.1 % (ref 39.0–52.0)
Hemoglobin: 15.8 g/dL (ref 13.0–17.0)
MCH: 30.4 pg (ref 26.0–34.0)
MCHC: 32.2 g/dL (ref 30.0–36.0)
MCV: 94.4 fL (ref 80.0–100.0)
Platelets: 222 10*3/uL (ref 150–400)
RBC: 5.2 MIL/uL (ref 4.22–5.81)
RDW: 13.8 % (ref 11.5–15.5)
WBC: 8.6 10*3/uL (ref 4.0–10.5)
nRBC: 0 % (ref 0.0–0.2)

## 2022-01-04 LAB — TSH: TSH: 3.059 u[IU]/mL (ref 0.350–4.500)

## 2022-01-04 LAB — T4, FREE: Free T4: 1.02 ng/dL (ref 0.61–1.12)

## 2022-01-04 LAB — BRAIN NATRIURETIC PEPTIDE: B Natriuretic Peptide: 600.5 pg/mL — ABNORMAL HIGH (ref 0.0–100.0)

## 2022-01-04 MED ORDER — DAPAGLIFLOZIN PROPANEDIOL 10 MG PO TABS: 10.0000 mg | ORAL_TABLET | Freq: Every day | ORAL | 6 refills | Status: DC

## 2022-01-04 MED ORDER — PERFLUTREN LIPID MICROSPHERE
1.0000 mL | INTRAVENOUS | Status: DC | PRN
  Administered 2022-01-04: 3.5 mL via INTRAVENOUS

## 2022-01-04 NOTE — Patient Instructions (Signed)
Medication Changes:  STOP Carvedilol  START Farxiga 10 mg Daily  Your provider has prescribed Wilder Glade for you. Please be aware the most common side effect of this medication is urinary tract infections and yeast infections. Please practice good hygiene and keep this area clean and dry to help prevent this. If you do begin to have symptoms of these infections, such as difficulty urinating or painful urination,  please let us know.  Lab Work:  Labs done today, your results will be available in MyChart, we will contact you for abnormal readings.  Testing/Procedures:  none  Referrals:  none  Special Instructions // Education:  Do the following things EVERYDAY: Weigh yourself in the morning before breakfast. Write it down and keep it in a log. Take your medicines as prescribed Eat low salt foods--Limit salt (sodium) to 2000 mg per day.  Stay as active as you can everyday Limit all fluids for the day to less than 2 liters   Follow-Up in: 1-2 months  At the [facility], you and your health needs are our priority. We have a designated team specialized in the treatment of Heart Failure. This Care Team includes your primary Heart Failure Specialized Cardiologist (physician), Advanced Practice Providers (APPs- Physician Assistants and Nurse Practitioners), and Pharmacist who all work together to provide you with the care you need, when you need it.   You may see any of the following providers on your designated Care Team at your next follow up:  Dr. Glori Bickers Dr. Loralie Champagne Dr. Roxana Hires, NP Lyda Jester, [place] [facility] [place], [place] Forestine Na, NP Audry Riles, PharmD   Please be sure to bring in all your medications bottles to every appointment.   Need to Contact us:  If you have any questions or concerns before your next appointment please send [place] a message through [place] or call our office at [phone].    TO  LEAVE A MESSAGE FOR THE NURSE SELECT OPTION 2, PLEASE LEAVE A MESSAGE INCLUDING: YOUR NAME DATE OF BIRTH CALL BACK NUMBER REASON FOR CALL**this is important as we prioritize the call backs  YOU WILL RECEIVE A CALL BACK THE SAME DAY AS LONG AS YOU CALL BEFORE 4:00 PM

## 2022-01-04 NOTE — Progress Notes (Signed)
Advanced Heart Failure Clinic Note   PCP: Christain Sacramento, MD HF Cardiologist: Dr. Haroldine Laws   HPI: [name] [name] is a 76 y.o. male with h/o DM2, COPD (quit smoking 12/18), severe 3v CAD s/p CABG 53/66/4403, chronic systolic HF due to ICM, EF 25-30% (echo 12/18),  HTN, DM2, h/o bladder CA s/p multiple rounds of BCG therapy, and paroxysmal AF.   Pt admitted in 12/18 with anterior STEMI and developed cardiogenic shock requiring Impella support. Urgent cath showed severe 3v CAD as below. While on impella, pt developed sepsis with WBC ~ 40K, UCx + pseudomonas. Treated for AECOPD and completed ABX therapy. BCx negative. Stabilized and underwent CABG x 2 with LIMA to LAD and SVG to OM on 04/08/2017. Pt transiently required milrinone but weaned prior to discharge. Required amiodarone for brief post op afib, but had no recurrence.   Echo 08/07/17 EF 35-40% laminated apical clot. (Dr.Sterlin Knightly felt ~ 25-30%) and moderate to severe MR.   Cardiac MRI 09/2017 EF 30%, laminated apical thrombus, normal RV size and function, LGE pattern suggests infarction in LAD territory, unlikely to improve with revascularization  S/p Medtronic ICD with Dr Caryl Comes 11/20/17.  Saw Dr. Caryl Comes on 01/07/20 and was in AF with RVR. Potassium also high so Inspra stopped. Dr. Olin Pia note also mentions chronotropic incompetence with exercise. Carvedilol cut back to 3.125 bid.   Follow up 7/22, stable NYHA II, volume good. Echo showed EF 20-25% , RV mildly reduced, no LV clot.  Admitted 4/23 with AFL with RVR. Underwent DCCV to NSR and discharged home. Presented back to ED next day with a/c CHF. Given IV lasix with improvement in symptoms. Discharged home on Lasix 40 mg daily x 2 days, then PRN. Post hospital follow up, he was volume overloaded and back in AFL RVR. Amiodarone started and arranged for DCCV.  S/p DCCV 08/10/21 to NSR.  Here for f/u. Doing ok. Says he is feeling a little worse. [facility]. Does bike easy  for 15 mins and does resistance exercise. Occasionally dizzy. Can walk steps without a problem . Feels weaker. Balance a bit off. Fluid was up. Increased lasix from 3x/week to 4 times per week. Remains on amio   ICD interrogation: Fluid level improved. No AF since 4/23. HRV very low. 0.4% VP . Personally reviewed   Cardiac Studies: - Echo 11/16/20 EF 20-25% RV mildly reduced No LV clot   - Echo 8/20 EF 25-30% RV ok. Small apical thrombus.  - L/RHC 03/31/2017 Prox RCA lesion is 100% stenosed. Dist RCA lesion is 100% stenosed (retrograde filling from collaterals does not go beyond the distal vessel) -->faint collateral filling to the distal PL and PDA from septal perforators and a diffusely diseased AV groove circumflex LAV Groove lesion is 90% stenosed. Prox LAD-1 lesion is 95% stenosed. Prox LAD-2 lesion is 85% stenosed. Heavily calcified, tandem lesions in very tortuous segment of the vessel. Prox Cx to Mid Cx lesion is 70% stenosed. Heavily calcified There is severe left ventricular systolic dysfunction. The left ventricular ejection fraction is less than 25% by visual estimate. LV end diastolic pressure is severely elevated -prior to Impella insertion Hemodynamic findings consistent with moderate pulmonary hypertension. Successful Impella insertion with 3.5-3.6 LPM flow's  - Echo 04/01/2017 EF 15-20%   - Echo 04/16/17 (post op) Improved to 25-30% on 04/16/17  - Echo 08/07/17: EF 35-40%, mod to severe MR  Review of systems complete and found to be negative unless listed in HPI.  Past Medical History:  Diagnosis Date   AICD (automatic cardioverter/defibrillator) present    Arthritis    Bladder cancer ([place])    Borderline glaucoma    Chronic systolic (congestive) heart failure (HCC)    Coronary artery disease    Diabetes mellitus type 2, controlled ([place]) ORAL MED   Essential hypertension    Heart murmur    Hepatitis    Medical history non-contributory    Paroxysmal A-fib (HCC)     Paroxysmal A-fib ([place]) 08/20/2020   Peripheral vascular disease (HCC)    Pneumonia    Current Outpatient Medications  Medication Sig Dispense Refill   amiodarone (PACERONE) 200 MG tablet Take 1 tablet (200 mg total) by mouth daily. 30 tablet 5   atorvastatin (LIPITOR) 80 MG tablet Take 1 tablet (80 mg total) by mouth daily. 90 tablet 3   carvedilol (COREG) 3.125 MG tablet Take 1 tablet (3.125 mg total) by mouth 2 (two) times daily with a meal. 180 tablet 3   ELIQUIS 5 MG TABS tablet TAKE 1 TABLET TWICE A DAY 180 tablet 3   ENTRESTO 97-103 MG TAKE 1 TABLET TWICE A DAY 180 tablet 3   furosemide (LASIX) 40 MG tablet TAKE 1 TABLET BY MOUTH 3 TIMES A WEEK. ON MONDAYS, WEDNESDAYS AND FRIDAYS 39 tablet 3   metFORMIN (GLUCOPHAGE-XR) 500 MG 24 hr tablet Take 1 tablet (500 mg total) by mouth daily with breakfast.     No current facility-administered medications for this encounter.   Facility-Administered Medications Ordered in Other Encounters  Medication Dose Route Frequency Provider Last Rate Last Admin   perflutren lipid microspheres (DEFINITY) IV suspension  1-10 mL Intravenous PRN Rafael Bihari, FNP   3.5 mL at 01/04/22 1205   Allergies  Allergen Reactions   Spironolactone Other (See Comments)    Painful gynecomastia   Niacin And Related Hives   Social History   Socioeconomic History   Marital status: Married    Spouse name: Not on file   Number of children: Not on file   Years of education: Not on file   Highest education level: Not on file  Occupational History   Occupation: retired  Tobacco Use   Smoking status: Former    Packs/day: 1.00    Years: 40.00    Total pack years: 40.00    Types: Cigarettes    Quit date: 03/31/2017    Years since quitting: 4.7   Smokeless tobacco: Never  Vaping Use   Vaping Use: Never used  Substance and Sexual Activity   Alcohol use: Not Currently    Comment: rarely   Drug use: No   Sexual activity: Not on file  Other Topics Concern    Not on file  Social History Narrative   Not on file   Social Determinants of Health   Financial Resource Strain: Not on file  Food Insecurity: No Food Insecurity (01/04/2022)   Hunger Vital Sign    Worried About Running Out of Food in the Last Year: Never true    Ran Out of Food in the Last Year: Never true  Transportation Needs: No Transportation Needs (01/04/2022)   PRAPARE - Hydrologist (Medical): No    Lack of Transportation (Non-Medical): No  Physical Activity: Inactive (07/11/2017)   Exercise Vital Sign    Days of Exercise per Week: 0 days    Minutes of Exercise per Session: 0 min  Stress: No Stress Concern Present (07/11/2017)   [employer] of  Occupational Health - Occupational Stress Questionnaire    Feeling of Stress : Only a little  Social Connections: Not on file  Intimate Partner Violence: Not on file   Family History  Problem Relation Age of Onset   Heart attack Sister    Heart disease Sister    BP (!) 148/80   Pulse 60   Wt 98 kg (216 lb)   SpO2 97%   BMI 27.73 kg/m   Wt Readings from Last 3 Encounters:  01/04/22 98 kg (216 lb)  08/28/21 95.5 kg (210 lb 9.6 oz)  08/28/21 95.3 kg (210 lb)    PHYSICAL EXAM: General:  NAD. No resp difficulty HEENT: Normal Neck: Supple. JVP 7-8. Carotids 2+ bilat; no bruits. No lymphadenopathy or thryomegaly appreciated. Cor: PMI nondisplaced. Brady rate & rhythm. No rubs, gallops or murmurs. Lungs: Clear Abdomen: Soft, nontender, nondistended. No hepatosplenomegaly. No bruits or masses. Good bowel sounds. Extremities: No cyanosis, clubbing, rash, edema Neuro: Alert & oriented x 3, cranial nerves grossly intact. Moves all 4 extremities w/o difficulty. Affect pleasant.  ECG: sinus brady 47. Low volts + 2 paced beats Personally reviewed  Device interrogation (personally reviewed): OptiVol trending up, stable thoracic below threshold, no further VT, 0.7% v-pacing, since 08/09/21, 1 hr daily  activity, no VT.  ASSESSMENT & PLAN: 1. Chronic systolic CHF due to ICM  - EF 04/01/17 EF 15-20% -> Improved to 25-30% on 04/16/17 Post op CABG as below.  - Echo 08/07/17 read as EF 35-40% but Dr Haroldine Laws reviewed personally and EF 25-30% (reviewed with Dr. Meda Coffee who agrees) + laminated apical clot and moderate to severe MR. RV ok.  - Cardiac MRI 09/2017 EF 30%, laminated apical thrombus, normal RV size and function, LGE pattern suggests infarction in LAD territory, unlikely to improve with revascularization - Echo 8/20 EF 25-30% RV ok. Mild MR Small apical thrombus.  - Echo 11/16/20 EF 20-25% RV mildly reduced No LV clot  - S/p Medtronic ICD with Dr Caryl Comes 11/20/17 - Stable NYHA II. Volume status improved on higher dose of lasix.  - With bradycardiac will stop carvedilol for now.  - Continue Entresto 97/103 mg bid. - Off Jardiance with yeast infection. Willing to try Farxiga 10 -> will start. Can cut lasix back to previous dosing - Off eplerenone due hyperkalemia (painful gynecomastia with spiro). - Labs today   2. Paroxysmal Atrial fibrillation/AFL RVR - AFL with RVR 4/23, s/p DCCV -->NSR - s/p DCCV 08/14/21-->NSR - ECG shows SB today. - No further AF/AFL on device interrogation today. - Continue amio 200 mg daily. - Continue Eliquis.  - He has seen Dr. Caryl Comes who felt tha AFL/AF ablation may be an option in light of [place]-AF data.. I discussed with him and Dr. Caryl Comes today. I have some concerns about procedural risk with GA. Will see him back in 1 month and try to decrease amio to 100 daily. If we can keep him in NSR on low dose amio this may be best option but if has recurrent AF then likely to warrant RFA. We will follow closely.   3. Mitral regurgitation - Moderate to severe on echo 07/2017 - Mild on echo 7/22. - Repeat echo at next visit.   4. CAD - s/p CABG with LIMA to LAD and SVG to OM on 04/08/2017 - No s/s ischemia  - Continue statin, BB.  - No ASA with Eliquis use.   5.  Tobacco abuse:  - Smoked 1 ppd PTA for > 50 years.  -  Remains quit. No change.    6. Hx of bladder CA:  - s/p multiple rounds of BCG therapy.   7. LV clot - Laminated, small - On Eliquis for PAF. Stable. No change.   8. DM2 - On metformin. - Unable to tolerate SGLT2i.  9. CKD 3b - Baseline SCr 1.7 - Labs today   Glori Bickers, MD 01/04/22

## 2022-01-04 NOTE — Progress Notes (Signed)
Echocardiogram 2D Echocardiogram has been performed.  Fidel Levy 01/04/2022, 12:04 PM

## 2022-01-05 LAB — T3, FREE: T3, Free: 2.2 pg/mL (ref 2.0–4.4)

## 2022-01-10 NOTE — Progress Notes (Signed)
Remote ICD transmission.   

## 2022-03-06 ENCOUNTER — Ambulatory Visit (INDEPENDENT_AMBULATORY_CARE_PROVIDER_SITE_OTHER): Payer: Medicare Other

## 2022-03-06 DIAGNOSIS — Z95810 Presence of automatic (implantable) cardiac defibrillator: Secondary | ICD-10-CM | POA: Diagnosis not present

## 2022-03-06 DIAGNOSIS — I255 Ischemic cardiomyopathy: Secondary | ICD-10-CM

## 2022-03-06 LAB — CUP PACEART REMOTE DEVICE CHECK
Battery Remaining Longevity: 85 mo
Battery Voltage: 3 V
Brady Statistic RV Percent Paced: 0.55 %
Date Time Interrogation Session: 20231107044223
HighPow Impedance: 83 Ohm
Implantable Lead Connection Status: 753985
Implantable Lead Implant Date: 20190724
Implantable Lead Location: 753860
Implantable Pulse Generator Implant Date: 20190724
Lead Channel Impedance Value: 323 Ohm
Lead Channel Impedance Value: 380 Ohm
Lead Channel Pacing Threshold Amplitude: 0.75 V
Lead Channel Pacing Threshold Pulse Width: 0.4 ms
Lead Channel Sensing Intrinsic Amplitude: 15 mV
Lead Channel Sensing Intrinsic Amplitude: 15 mV
Lead Channel Setting Pacing Amplitude: 2.5 V
Lead Channel Setting Pacing Pulse Width: 0.4 ms
Lead Channel Setting Sensing Sensitivity: 0.3 mV
Zone Setting Status: 755011

## 2022-03-06 NOTE — Progress Notes (Signed)
Advanced Heart Failure Clinic Note   PCP: [name], MD HF Cardiologist: Dr. Gala Romney   HPI: [name] [name] is a 76 y.o. 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   Pt admitted in 12/18 with anterior STEMI and developed cardiogenic shock requiring Impella support. Urgent cath showed severe 3v CAD as below. While on impella, pt developed sepsis with WBC ~ 40K, UCx + pseudomonas. Treated for AECOPD and completed ABX therapy. BCx negative. Stabilized and underwent CABG x 2 with LIMA to LAD and SVG to OM on 04/08/2017. Pt transiently required milrinone but weaned prior to discharge. Required amiodarone for brief post op afib, but had no recurrence.   Echo 08/07/17 EF 35-40% laminated apical clot. (Dr.Bensimhon felt ~ 25-30%) and moderate to severe MR.   Cardiac MRI 09/2017 EF 30%, laminated apical thrombus, normal RV size and function, LGE pattern suggests infarction in LAD territory, unlikely to improve with revascularization  S/p Medtronic ICD with Dr Graciela Husbands 11/20/17.  Saw Dr. Graciela Husbands on 01/07/20 and was in AF with RVR. Potassium also high so Inspra stopped. Dr. Odessa Fleming note also mentions chronotropic incompetence with exercise. Carvedilol cut back to 3.125 bid.   Follow up 7/22, stable NYHA II, volume good. Echo showed EF 20-25% , RV mildly reduced, no LV clot.  Admitted 4/23 with AFL with RVR. Underwent DCCV to NSR and discharged home. Presented back to ED next day with a/c CHF. Given IV lasix with improvement in symptoms. Discharged home on Lasix 40 mg daily x 2 days, then PRN. Post hospital follow up, he was volume overloaded and back in AFL RVR. Amiodarone started and arranged for DCCV.  S/p DCCV 08/10/21 to NSR.  Follow up 9/23, NYHA II, Coreg stopped with bradycardia, started on Farxiga and reduced lasix  dose.  Echo (9/23) showed EF 20-25%, RV mildly reduced, moderate MR, mild to moderate AI  Today he returns for HF follow up. Overall feeling fine. [facility], bikes and does resistance exercises. Feels more fatigued and SOB after 3rd rep of exercises and has to stop. He is frustrated by this. Wondering if medications are causing symptoms. No SOB walking on flat ground. Denies palpitations, GU symptoms, abnormal bleeding, CP, dizziness, edema, or PND/Orthopnea. Appetite ok. No fever or chills. Weight at home 210 pounds. Taking all medications. Feels no difference off Coreg.   Cardiac Studies:  - Echo (9/23): EF 20-25%, RV mildly reduced, moderate MR, mild to moderate AI  - Echo (11/16/20): EF 20-25% RV mildly reduced No LV clot   - Echo (8/20): EF 25-30% RV ok. Small apical thrombus.  - L/RHC 03/31/2017 Prox RCA lesion is 100% stenosed. Dist RCA lesion is 100% stenosed (retrograde filling from collaterals does not go beyond the distal vessel) -->faint collateral filling to the distal PL and PDA from septal perforators and a diffusely diseased AV groove circumflex LAV Groove lesion is 90% stenosed. Prox LAD-1 lesion is 95% stenosed. Prox LAD-2 lesion is 85% stenosed. Heavily calcified, tandem lesions in very tortuous segment of the vessel. Prox Cx to Mid Cx lesion is 70% stenosed. Heavily calcified There is severe left ventricular systolic dysfunction. The left ventricular ejection fraction is less than 25% by visual estimate. LV end diastolic pressure is severely elevated -prior to Impella insertion Hemodynamic findings  consistent with moderate pulmonary hypertension. Successful Impella insertion with 3.5-3.6 LPM flow's  - Echo 04/01/2017 EF 15-20%   - Echo 04/16/17 (post op) Improved to 25-30% on 04/16/17  - Echo 08/07/17: EF 35-40%, mod to severe MR  Review of systems complete and found to be negative unless listed in HPI.   Past Medical History:  Diagnosis Date    AICD (automatic cardioverter/defibrillator) present    Arthritis    Bladder cancer (HCC)    Borderline glaucoma    Chronic systolic (congestive) heart failure (HCC)    Coronary artery disease    Diabetes mellitus type 2, controlled (HCC) ORAL MED   Essential hypertension    Heart murmur    Hepatitis    Medical history non-contributory    Paroxysmal A-fib (HCC)    Paroxysmal A-fib (HCC) 08/20/2020   Peripheral vascular disease (HCC)    Pneumonia    Current Outpatient Medications  Medication Sig Dispense Refill   amiodarone (PACERONE) 200 MG tablet Take 1 tablet (200 mg total) by mouth daily. 30 tablet 5   atorvastatin (LIPITOR) 80 MG tablet Take 1 tablet (80 mg total) by mouth daily. 90 tablet 3   dapagliflozin propanediol (FARXIGA) 10 MG TABS tablet Take 1 tablet (10 mg total) by mouth daily before breakfast. 30 tablet 6   ELIQUIS 5 MG TABS tablet TAKE 1 TABLET TWICE A DAY 180 tablet 3   ENTRESTO 97-103 MG TAKE 1 TABLET TWICE A DAY 180 tablet 3   furosemide (LASIX) 40 MG tablet TAKE 1 TABLET BY MOUTH 3 TIMES A WEEK. ON MONDAYS, WEDNESDAYS AND FRIDAYS 39 tablet 3   metFORMIN (GLUCOPHAGE-XR) 500 MG 24 hr tablet Take 1 tablet (500 mg total) by mouth daily with breakfast.     No current facility-administered medications for this encounter.   Allergies  Allergen Reactions   Spironolactone Other (See Comments)    Painful gynecomastia   Niacin And Related Hives   Social History   Socioeconomic History   Marital status: Married    Spouse name: Not on file   Number of children: Not on file   Years of education: Not on file   Highest education level: Not on file  Occupational History   Occupation: retired  Tobacco Use   Smoking status: Former    Packs/day: 1.00    Years: 40.00    Total pack years: 40.00    Types: Cigarettes    Quit date: 03/31/2017    Years since quitting: 4.9   Smokeless tobacco: Never  Vaping Use   Vaping Use: Never used  Substance and Sexual Activity    Alcohol use: Not Currently    Comment: rarely   Drug use: No   Sexual activity: Not on file  Other Topics Concern   Not on file  Social History Narrative   Not on file   Social Determinants of Health   Financial Resource Strain: Not on file  Food Insecurity: No Food Insecurity (01/04/2022)   Hunger Vital Sign    Worried About Running Out of Food in the Last Year: Never true    Ran Out of Food in the Last Year: Never true  Transportation Needs: No Transportation Needs (01/04/2022)   PRAPARE - Administrator, Civil Service (Medical): No    Lack of Transportation (Non-Medical): No  Physical Activity: Inactive (07/11/2017)   Exercise Vital Sign    Days of Exercise per Week: 0 days    Minutes of Exercise per Session: 0 min  Stress: No Stress Concern Present (07/11/2017)   [employer] of Occupational Health - Occupational Stress Questionnaire    Feeling of Stress : Only a little  Social Connections: Not on file  Intimate Partner Violence: Not on file   Family History  Problem Relation Age of Onset   Heart attack Sister    Heart disease Sister    BP 130/82   Pulse (!) 55   Wt 96.6 kg (213 lb)   SpO2 96%   BMI 27.35 kg/m   Wt Readings from Last 3 Encounters:  03/07/22 96.6 kg (213 lb)  01/04/22 98 kg (216 lb)  08/28/21 95.5 kg (210 lb 9.6 oz)    PHYSICAL EXAM: General:  NAD. No resp difficulty HEENT: Normal Neck: Supple. No JVD. Carotids 2+ bilat; no bruits. No lymphadenopathy or thryomegaly appreciated. Cor: PMI nondisplaced. Regular rate & rhythm. No rubs, gallops or murmurs. Lungs: Clear Abdomen: Soft, nontender, nondistended. No hepatosplenomegaly. No bruits or masses. Good bowel sounds. Extremities: No cyanosis, clubbing, rash, edema Neuro: Alert & oriented x 3, cranial nerves grossly intact. Moves all 4 extremities w/o difficulty. Affect pleasant.  ECG (personally reviewed): NSR 61 bpm  Device interrogation (personally reviewed): OptiVol stable,  stable thoracic impedence, no VT, no AF, 1-2 hr daily activity.  ASSESSMENT & PLAN: 1. Chronic systolic CHF due to ICM  - EF 16/1/09 EF 15-20% -> Improved to 25-30% on 04/16/17 Post op CABG as below.  - Echo 08/07/17 read as EF 35-40% but Dr Gala Romney reviewed personally and EF 25-30% (reviewed with Dr. Delton See who agrees) + laminated apical clot and moderate to severe MR. RV ok.  - Cardiac MRI 09/2017 EF 30%, laminated apical thrombus, normal RV size and function, LGE pattern suggests infarction in LAD territory, unlikely to improve with revascularization - S/p Medtronic ICD with Dr Graciela Husbands 11/20/17 - Echo (8/20): EF 25-30% RV ok. Mild MR. Small apical thrombus.  - Echo (11/16/20): EF 20-25% RV mildly reduced No LV clot  - Stable NYHA II. Volume status looks good today. - Off Coreg with bradycardia. - Continue Entresto 97/103 mg bid. - Continue Farxiga 10 mg daily (Off Jardiance with yeast infection) - Continue Lasix 40 mg MWF - Off eplerenone due hyperkalemia (painful gynecomastia with spiro). - Labs today.  2. Paroxysmal Atrial fibrillation/AFL RVR - AFL with RVR 4/23, s/p DCCV -->NSR - s/p DCCV 08/14/21-->NSR - No further AF/AFL on device interrogation today. - NSR on ECG today. - Continue Eliquis.  - Decrease amiodarone to 100 mg daily (see below). - He has seen Dr. Graciela Husbands who felt that AFL/AF ablation may be an option in light of [place]-AF data. Dr. Gala Romney discussed his concerns over procedural GA with him and Dr. Graciela Husbands. Will try to decrease amio to 100 daily. If we can keep him in NSR on low dose amio, this may be best option but if has recurrent AF then likely to warrant RFA.  - We will follow closely.   3. Mitral regurgitation - Moderate to severe on echo 07/2017 - Mild on echo 7/22. - Moderate on echo 9/23   4. CAD - s/p CABG with LIMA to LAD and SVG to OM on 04/08/2017 - No s/s ischemia  - Continue statin. - Add back beta blocker if able. - No ASA with Eliquis use.   5.  Tobacco abuse  - Smoked 1 ppd PTA for > 50 years.  - Remains quit.  - No change.    6. Hx of bladder CA  -  s/p multiple rounds of BCG therapy.   7. LV clot - Laminated, small. - On Eliquis for PAF.  - No clot on recent echo.   8. DM2 - On metformin. - Continue SGLT2i.  9. CKD 3b - Baseline SCr 1.7 - Labs today   Follow up in 3 months with Dr. Gala Romney.  Anderson [place], FNP 03/07/22

## 2022-03-07 ENCOUNTER — Ambulatory Visit (HOSPITAL_COMMUNITY)
Admission: RE | Admit: 2022-03-07 | Discharge: 2022-03-07 | Disposition: A | Discharge status: Home / Self Care | Payer: Medicare Other | Source: Ambulatory Visit | Attending: Family Medicine | Admitting: Family Medicine

## 2022-03-07 ENCOUNTER — Encounter (HOSPITAL_COMMUNITY): Payer: Self-pay

## 2022-03-07 VITALS — BP 130/82 | HR 55 | Wt 213.0 lb

## 2022-03-07 DIAGNOSIS — N62 Hypertrophy of breast: Secondary | ICD-10-CM | POA: Diagnosis not present

## 2022-03-07 DIAGNOSIS — I2510 Atherosclerotic heart disease of native coronary artery without angina pectoris: Secondary | ICD-10-CM | POA: Diagnosis not present

## 2022-03-07 DIAGNOSIS — Z8551 Personal history of malignant neoplasm of bladder: Secondary | ICD-10-CM

## 2022-03-07 DIAGNOSIS — Z951 Presence of aortocoronary bypass graft: Secondary | ICD-10-CM | POA: Diagnosis not present

## 2022-03-07 DIAGNOSIS — N1832 Chronic kidney disease, stage 3b: Secondary | ICD-10-CM

## 2022-03-07 DIAGNOSIS — N183 Chronic kidney disease, stage 3 unspecified: Secondary | ICD-10-CM

## 2022-03-07 DIAGNOSIS — I130 Hypertensive heart and chronic kidney disease with heart failure and stage 1 through stage 4 chronic kidney disease, or unspecified chronic kidney disease: Secondary | ICD-10-CM | POA: Insufficient documentation

## 2022-03-07 DIAGNOSIS — I480 Paroxysmal atrial fibrillation: Secondary | ICD-10-CM | POA: Insufficient documentation

## 2022-03-07 DIAGNOSIS — J849 Interstitial pulmonary disease, unspecified: Secondary | ICD-10-CM | POA: Insufficient documentation

## 2022-03-07 DIAGNOSIS — E1151 Type 2 diabetes mellitus with diabetic peripheral angiopathy without gangrene: Secondary | ICD-10-CM | POA: Diagnosis not present

## 2022-03-07 DIAGNOSIS — Z720 Tobacco use: Secondary | ICD-10-CM

## 2022-03-07 DIAGNOSIS — E875 Hyperkalemia: Secondary | ICD-10-CM | POA: Diagnosis not present

## 2022-03-07 DIAGNOSIS — E1122 Type 2 diabetes mellitus with diabetic chronic kidney disease: Secondary | ICD-10-CM | POA: Diagnosis not present

## 2022-03-07 DIAGNOSIS — I4892 Unspecified atrial flutter: Secondary | ICD-10-CM

## 2022-03-07 DIAGNOSIS — Z7901 Long term (current) use of anticoagulants: Secondary | ICD-10-CM | POA: Insufficient documentation

## 2022-03-07 DIAGNOSIS — I340 Nonrheumatic mitral (valve) insufficiency: Secondary | ICD-10-CM | POA: Diagnosis not present

## 2022-03-07 DIAGNOSIS — Z79899 Other long term (current) drug therapy: Secondary | ICD-10-CM | POA: Diagnosis not present

## 2022-03-07 DIAGNOSIS — F17210 Nicotine dependence, cigarettes, uncomplicated: Secondary | ICD-10-CM | POA: Diagnosis not present

## 2022-03-07 DIAGNOSIS — I513 Intracardiac thrombosis, not elsewhere classified: Secondary | ICD-10-CM | POA: Diagnosis not present

## 2022-03-07 DIAGNOSIS — I5022 Chronic systolic (congestive) heart failure: Secondary | ICD-10-CM

## 2022-03-07 LAB — BASIC METABOLIC PANEL
Anion gap: 10 (ref 5–15)
BUN: 26 mg/dL — ABNORMAL HIGH (ref 8–23)
CO2: 25 mmol/L (ref 22–32)
Calcium: 9 mg/dL (ref 8.9–10.3)
Chloride: 103 mmol/L (ref 98–111)
Creatinine, Ser: 2.15 mg/dL — ABNORMAL HIGH (ref 0.61–1.24)
GFR, Estimated: 31 mL/min — ABNORMAL LOW (ref >60)
Glucose, Bld: 150 mg/dL — ABNORMAL HIGH (ref 70–99)
Potassium: 4.5 mmol/L (ref 3.5–5.1)
Sodium: 138 mmol/L (ref 135–145)

## 2022-03-07 MED ORDER — AMIODARONE HCL 100 MG PO TABS: 100.0000 mg | ORAL_TABLET | Freq: Every day | ORAL | 6 refills | Status: DC

## 2022-03-07 NOTE — Patient Instructions (Signed)
DECREASE Amiodarone 100 mg one tab daily   Labs today We will only contact you if something comes back abnormal or we need to make some changes. Otherwise no news is good news!   Your physician wants you to follow-up in: 3 months with Dr Haroldine Laws. You will receive a reminder letter in the mail two months in advance. If you don't receive a letter, please call our office to schedule the follow-up appointment.   Do the following things EVERYDAY: Weigh yourself in the morning before breakfast. Write it down and keep it in a log. Take your medicines as prescribed Eat low salt foods--Limit salt (sodium) to 2000 mg per day.  Stay as active as you can everyday Limit all fluids for the day to less than 2 liters   At the [facility], you and your health needs are our priority. As part of our continuing mission to provide you with exceptional heart care, we have created designated Provider Care Teams. These Care Teams include your primary Cardiologist (physician) and Advanced Practice Providers (APPs- Physician Assistants and Nurse Practitioners) who all work together to provide you with the care you need, when you need it.   You may see any of the following providers on your designated Care Team at your next follow up: Dr Glori Bickers Dr Loralie Champagne Dr. Roxana Hires, NP Lyda Jester, [place] [facility] [place], [place] Forestine Na, NP Audry Riles, PharmD   Please be sure to bring in all your medications bottles to every appointment.    If you have any questions or concerns before your next appointment please send [place] a message through [facility] or call our office at [phone].    TO LEAVE A MESSAGE FOR THE NURSE SELECT OPTION 2, PLEASE LEAVE A MESSAGE INCLUDING: YOUR NAME DATE OF BIRTH CALL BACK NUMBER REASON FOR CALL**this is important as we prioritize the call backs  YOU WILL RECEIVE A CALL BACK THE SAME DAY AS LONG AS YOU CALL BEFORE  4:00 PM

## 2022-04-02 NOTE — Progress Notes (Signed)
Remote ICD transmission.   

## 2022-04-20 ENCOUNTER — Other Ambulatory Visit (HOSPITAL_COMMUNITY): Payer: Self-pay | Admitting: Family Medicine

## 2022-06-05 ENCOUNTER — Ambulatory Visit: Payer: Medicare Other

## 2022-06-05 DIAGNOSIS — I255 Ischemic cardiomyopathy: Secondary | ICD-10-CM

## 2022-06-06 LAB — CUP PACEART REMOTE DEVICE CHECK
Battery Remaining Longevity: 81 mo
Battery Voltage: 2.98 V
Brady Statistic RV Percent Paced: 0.19 %
Date Time Interrogation Session: 20240206185254
HighPow Impedance: 77 Ohm
Implantable Lead Connection Status: 753985
Implantable Lead Implant Date: 20190724
Implantable Lead Location: 753860
Implantable Pulse Generator Implant Date: 20190724
Lead Channel Impedance Value: 323 Ohm
Lead Channel Impedance Value: 380 Ohm
Lead Channel Pacing Threshold Amplitude: 0.75 V
Lead Channel Pacing Threshold Pulse Width: 0.4 ms
Lead Channel Sensing Intrinsic Amplitude: 17.875 mV
Lead Channel Sensing Intrinsic Amplitude: 17.875 mV
Lead Channel Setting Pacing Amplitude: 2.5 V
Lead Channel Setting Pacing Pulse Width: 0.4 ms
Lead Channel Setting Sensing Sensitivity: 0.3 mV
Zone Setting Status: 755011

## 2022-07-03 NOTE — Progress Notes (Signed)
Remote ICD transmission.   

## 2022-07-27 ENCOUNTER — Other Ambulatory Visit (HOSPITAL_COMMUNITY): Payer: Self-pay | Admitting: Internal Medicine

## 2022-08-22 ENCOUNTER — Encounter (HOSPITAL_COMMUNITY): Payer: Self-pay | Admitting: Internal Medicine

## 2022-08-23 ENCOUNTER — Encounter: Payer: Self-pay | Admitting: Internal Medicine

## 2022-08-24 ENCOUNTER — Telehealth: Payer: Self-pay | Admitting: Internal Medicine

## 2022-08-24 NOTE — Telephone Encounter (Signed)
  Pt scheduled his yearly f/u with Mardelle Matte. He would like to know if he need any test done prior to his appt on 05/28

## 2022-08-27 NOTE — Telephone Encounter (Signed)
Spoke with pt and advised per Dr Odessa Fleming last note there is no request for testing prior to next appointment. Pt advised I do see a reply from his message to Dr Gala Romney requesting a f/u appointment and echo but someone from Dr Bensimhon's office will be in touch to schedule these appointments.  Pt verbalized understanding and thanked RN for the call back.

## 2022-09-04 ENCOUNTER — Ambulatory Visit (INDEPENDENT_AMBULATORY_CARE_PROVIDER_SITE_OTHER): Payer: Medicare Other

## 2022-09-04 DIAGNOSIS — I255 Ischemic cardiomyopathy: Secondary | ICD-10-CM | POA: Diagnosis not present

## 2022-09-04 LAB — CUP PACEART REMOTE DEVICE CHECK
Battery Remaining Longevity: 72 mo
Battery Voltage: 2.98 V
Brady Statistic RV Percent Paced: 0.04 %
Date Time Interrogation Session: 20240507061606
HighPow Impedance: 79 Ohm
Implantable Lead Connection Status: 753985
Implantable Lead Implant Date: 20190724
Implantable Lead Location: 753860
Implantable Pulse Generator Implant Date: 20190724
Lead Channel Impedance Value: 323 Ohm
Lead Channel Impedance Value: 380 Ohm
Lead Channel Pacing Threshold Amplitude: 0.75 V
Lead Channel Pacing Threshold Pulse Width: 0.4 ms
Lead Channel Sensing Intrinsic Amplitude: 13.375 mV
Lead Channel Sensing Intrinsic Amplitude: 13.375 mV
Lead Channel Setting Pacing Amplitude: 2.5 V
Lead Channel Setting Pacing Pulse Width: 0.4 ms
Lead Channel Setting Sensing Sensitivity: 0.3 mV
Zone Setting Status: 755011

## 2022-09-21 NOTE — Progress Notes (Unsigned)
  Electrophysiology Office Note:   Date:  09/25/2022  ID:  [name] [name], [name] [name] December 13, 1945, MRN [id number]  Primary Cardiologist: Arvilla Meres, MD Electrophysiologist: Sherryl Manges, MD   History of Present Illness:   [name] [name] is a 77 y.o. male with h/o CAD s/p CABG, CHF, h/o LV thrombus, AFL, and sinus bradycardia seen today for routine electrophysiology followup. Since last being seen in our clinic the patient reports doing OK. He feels like he has had waxing and waning fatigue since having issues with atrial flutter this time last year. He remains fatigue and SOB with anything more than ADLs, though has improved somewhat in the past few weeks. Was quite bad in April, which coincided with volume on Optivol.   Review of systems complete and found to be negative unless listed in HPI.   Device History: Medtronic Single Chamber ICD implanted 10/2017 for CHF   Studies Reviewed:    ICD Interrogation-  reviewed in detail today,  See PACEART report.  EKG is ordered today. Personal review shows NSR at 78 bpm     Physical Exam:   VS:  BP (!) 142/70   Pulse 75   Ht 6\' 2"  (1.88 m)   Wt 207 lb 9.6 oz (94.2 kg)   SpO2 (!) 75%   BMI 26.65 kg/m    Wt Readings from Last 3 Encounters:  09/25/22 207 lb 9.6 oz (94.2 kg)  03/07/22 213 lb (96.6 kg)  01/04/22 216 lb (98 kg)     GEN: Well nourished, well developed in no acute distress NECK: No JVD; No carotid bruits CARDIAC: Regular rate and rhythm, no murmurs, rubs, gallops RESPIRATORY:  Clear to auscultation without rales, wheezing or rhonchi  ABDOMEN: Soft, non-tender, non-distended EXTREMITIES:  No edema; No deformity   ASSESSMENT AND PLAN:    Chronic systolic dysfunction s/p Medtronic single chamber ICD   Cardiac MRI 09/2017 EF 30%, laminated apical thrombus, normal RV size and function, LGE pattern suggests infarction in LAD territory, unlikely to improve with revascularization - S/p Medtronic ICD with Dr Graciela Husbands 11/20/17 - Echo  (8/20): EF 25-30% RV ok. Mild MR. Small apical thrombus.  - Echo (11/16/20): EF 20-25% RV mildly reduced No LV clot - Pending update Echo tomorrow euvolemic today Stable on an appropriate medical regimen NYHA III symptoms Normal ICD function See Pace Art report No changes today Has not tolerated BB due to bradycardia/fatigue.  I think pt would benefit from CCM or Barostim. Labwork today. He sees Dr. Gala Romney tomorrow and can further discuss. If labs meet Dr. Gala Romney thinks a better option can refer to Dr. Myra Gianotti for BAT/BATWIRE, or will have seen back in 6-8 weeks by Dr. Graciela Husbands to discuss CCM.   CAD s/p CABG Denies s/s ischemia  Paroxysmal atrial fibrillation AFL EKG today shows NSR at 75 bpm, with 0% AF/AFL burden Continue amiodarone 100 mg daily. Surveillance labs today.     Disposition:   Follow up with Dr. Graciela Husbands 4-8 weeks to discuss CCM (vs Dr. Myra Gianotti or [facility] for BAT)   Signed, Graciella Freer, PA-C

## 2022-09-24 NOTE — Progress Notes (Incomplete)
Advanced [facility] Clinic Note   PCP: [name], MD HF Cardiologist: Dr. Gala Romney   HPI: [name] [name] is a 77 y.o. 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   Pt admitted in 12/18 with anterior STEMI and developed cardiogenic shock requiring Impella support. Urgent cath showed severe 3v CAD as below. While on impella, pt developed sepsis with WBC ~ 40K, UCx + pseudomonas. Treated for AECOPD and completed ABX therapy. BCx negative. Stabilized and underwent CABG x 2 with LIMA to LAD and SVG to OM on 04/08/2017. Pt transiently required milrinone but weaned prior to discharge. Required amiodarone for brief post op afib, but had no recurrence.   Echo 08/07/17 EF 35-40% laminated apical clot. (Dr.Bensimhon felt ~ 25-30%) and moderate to severe MR.   cMRI 6/19 EF 30%, laminated apical thrombus, normal RV size and function, LGE pattern suggests infarction in LAD territory, unlikely to improve with revascularization  S/p Medtronic ICD with Dr Graciela Husbands 11/20/17.  Saw Dr. Graciela Husbands on 01/07/20 and was in AF with RVR. Potassium also high so Inspra stopped. Dr. Odessa Fleming note also mentions chronotropic incompetence with exercise. Carvedilol cut back to 3.125 bid.   Follow up 7/22, stable NYHA II, volume good. Echo showed EF 20-25% , RV mildly reduced, no LV clot.  Admitted 4/23 with AFL with RVR. Underwent DCCV to NSR. Presented back to ED next day with a/c CHF. Given IV lasix with improvement in symptoms. Discharged home on Lasix 40 mg daily x 2 days, then PRN. Post hospital follow up, he was volume overloaded and back in AFL RVR. Amiodarone started and arranged for DCCV.  S/p DCCV 08/10/21 to NSR.  Follow up 9/23, NYHA II, Coreg stopped with bradycardia, started on Farxiga and reduced lasix dose.  Echo (9/23) showed EF  20-25%, RV mildly reduced, moderate MR, mild to moderate AI  Today he returns for HF follow up. Overall feeling fine[place] [facility], bikes and does resistance exercises. Feels more fatigued and SOB after 3rd rep of exercises and has to stop. He is frustrated by this. Wondering if medications are causing symptoms. No SOB walking on flat ground. Denies palpitations, GU symptoms, abnormal bleeding, CP, dizziness, edema, or PND/Orthopnea. Appetite ok. No fever or chills. Weight at home 210 pounds. Taking all medications. Feels no difference off Coreg.   Cardiac Studies:  - Echo (9/23): EF 20-25%, RV mildly reduced, moderate MR, mild to moderate AI  - Echo (11/16/20): EF 20-25% RV mildly reduced No LV clot   - Echo (8/20): EF 25-30% RV ok. Small apical thrombus.  - L/RHC 03/31/2017 Prox RCA lesion is 100% stenosed. Dist RCA lesion is 100% stenosed (retrograde filling from collaterals does not go beyond the distal vessel) -->faint collateral filling to the distal PL and PDA from septal perforators and a diffusely diseased AV groove circumflex LAV Groove lesion is 90% stenosed. Prox LAD-1 lesion is 95% stenosed. Prox LAD-2 lesion is 85% stenosed. Heavily calcified, tandem lesions in very tortuous segment of the vessel. Prox Cx to Mid Cx lesion is 70% stenosed. Heavily calcified There is severe left ventricular systolic dysfunction. The left ventricular ejection fraction is less than 25% by visual estimate. LV end diastolic pressure is severely elevated -prior to Impella insertion Hemodynamic findings consistent with moderate pulmonary  hypertension. Successful Impella insertion with 3.5-3.6 LPM flow's  - Echo 04/01/2017 EF 15-20%   - Echo 04/16/17 (post op) Improved to 25-30% on 04/16/17  - Echo 08/07/17: EF 35-40%, mod to severe MR  Review of systems complete and found to be negative unless listed in HPI.   Past Medical History:  Diagnosis Date   AICD (automatic  cardioverter/defibrillator) present    Arthritis    Bladder cancer (HCC)    Borderline glaucoma    Chronic systolic (congestive) heart failure (HCC)    Coronary artery disease    Diabetes mellitus type 2, controlled (HCC) [facility]   Essential hypertension    Heart murmur    Hepatitis    Medical history non-contributory    Paroxysmal A-fib (HCC)    Paroxysmal A-fib (HCC) 08/20/2020   Peripheral vascular disease (HCC)    Pneumonia    Current Outpatient Medications  Medication Sig Dispense Refill   amiodarone (PACERONE) 100 MG tablet Take 1 tablet (100 mg total) by mouth daily. 30 tablet 6   atorvastatin (LIPITOR) 80 MG tablet TAKE 1 TABLET BY MOUTH EVERY DAY 90 tablet 3   dapagliflozin propanediol (FARXIGA) 10 MG TABS tablet Take 1 tablet (10 mg total) by mouth daily before breakfast. 30 tablet 6   ELIQUIS 5 MG TABS tablet TAKE 1 TABLET TWICE A DAY 180 tablet 3   ENTRESTO 97-103 MG TAKE 1 TABLET TWICE A DAY 180 tablet 3   furosemide (LASIX) 40 MG tablet TAKE 1 TABLET BY MOUTH 3 TIMES A WEEK. ON MONDAYS, WEDNESDAYS AND FRIDAYS 39 tablet 3   metFORMIN (GLUCOPHAGE-XR) 500 MG 24 hr tablet Take 1 tablet (500 mg total) by mouth daily with breakfast.     No current facility-administered medications for this encounter.   Allergies  Allergen Reactions   Spironolactone Other (See Comments)    Painful gynecomastia   Niacin And Related Hives   Social History   Socioeconomic History   Marital status: Married    Spouse name: Not on file   Number of children: Not on file   Years of education: Not on file   Highest education level: Not on file  Occupational History   Occupation: retired  Tobacco Use   Smoking status: Former    Packs/day: 1.00    Years: 40.00    Additional pack years: 0.00    Total pack years: 40.00    Types: Cigarettes    Quit date: 03/31/2017    Years since quitting: 5.4   Smokeless tobacco: Never  Vaping Use   Vaping Use: Never used  Substance and Sexual  Activity   Alcohol use: Not Currently    Comment: rarely   Drug use: No   Sexual activity: Not on file  Other Topics Concern   Not on file  Social History Narrative   Not on file   Social Determinants of Health   Financial Resource Strain: Not on file  Food Insecurity: No Food Insecurity (01/04/2022)   Hunger Vital Sign    Worried About Running Out of Food in the Last Year: Never true    Ran Out of Food in the Last Year: Never true  Transportation Needs: No Transportation Needs (01/04/2022)   PRAPARE - Administrator, Civil Service (Medical): No    Lack of Transportation (Non-Medical): No  Physical Activity: Inactive (07/11/2017)   Exercise Vital Sign    Days of Exercise per Week: 0 days    Minutes of Exercise per Session: 0  min  Stress: No Stress Concern Present (07/11/2017)   [employer] of Occupational Health - Occupational Stress Questionnaire    Feeling of Stress : Only a little  Social Connections: Not on file  Intimate Partner Violence: Not on file   Family History  Problem Relation Age of Onset   Heart attack Sister    Heart disease Sister    There were no vitals taken for this visit.  Wt Readings from Last 3 Encounters:  03/07/22 96.6 kg (213 lb)  01/04/22 98 kg (216 lb)  08/28/21 95.5 kg (210 lb 9.6 oz)    PHYSICAL EXAM: General:  NAD. No resp difficulty HEENT: Normal Neck: Supple. No JVD. Carotids 2+ bilat; no bruits. No lymphadenopathy or thryomegaly appreciated. Cor: PMI nondisplaced. Regular rate & rhythm. No rubs, gallops or murmurs. Lungs: Clear Abdomen: Soft, nontender, nondistended. No hepatosplenomegaly. No bruits or masses. Good bowel sounds. Extremities: No cyanosis, clubbing, rash, edema Neuro: Alert & oriented x 3, cranial nerves grossly intact. Moves all 4 extremities w/o difficulty. Affect pleasant.  ECG (personally reviewed): NSR 61 bpm  Device interrogation (personally reviewed): OptiVol stable, stable thoracic impedence, no  VT, no AF, 1-2 hr daily activity.  ASSESSMENT & PLAN: 1. Chronic systolic CHF due to ICM  - EF 40/9/81 EF 15-20% -> Improved to 25-30% on 04/16/17 Post op CABG as below.  - Echo 08/07/17 read as EF 35-40% but Dr Gala Romney reviewed personally and EF 25-30% (reviewed with Dr. Delton See who agrees) + laminated apical clot and moderate to severe MR. RV ok.  - Cardiac MRI 09/2017 EF 30%, laminated apical thrombus, normal RV size and function, LGE pattern suggests infarction in LAD territory, unlikely to improve with revascularization - S/p Medtronic ICD with Dr Graciela Husbands 11/20/17 - Echo (8/20): EF 25-30% RV ok. Mild MR. Small apical thrombus.  - Echo (11/16/20): EF 20-25% RV mildly reduced No LV clot  - Stable NYHA II. Volume status looks good today. - Off Coreg with bradycardia. - Continue Entresto 97/103 mg bid. - Continue Farxiga 10 mg daily (Off Jardiance with yeast infection) - Continue Lasix 40 mg MWF - Off eplerenone due hyperkalemia (painful gynecomastia with spiro). - Labs today.  2. Paroxysmal Atrial fibrillation/AFL RVR - AFL with RVR 4/23, s/p DCCV -->NSR - s/p DCCV 08/14/21-->NSR - No further AF/AFL on device interrogation today. - NSR on ECG today. - Continue Eliquis.  - Decrease amiodarone to 100 mg daily (see below). - He has seen Dr. Graciela Husbands who felt that AFL/AF ablation may be an option in light of [place]-AF data. Dr. Gala Romney discussed his concerns over procedural GA with him and Dr. Graciela Husbands. Will try to decrease amio to 100 daily. If we can keep him in NSR on low dose amio, this may be best option but if has recurrent AF then likely to warrant RFA.  - We will follow closely.   3. Mitral regurgitation - Moderate to severe on echo 07/2017 - Mild on echo 7/22. - Moderate on echo 9/23   4. CAD - s/p CABG with LIMA to LAD and SVG to OM on 04/08/2017 - No s/s ischemia  - Continue statin. - Add back beta blocker if able. - No ASA with Eliquis use.   5. Tobacco abuse  - Smoked 1 ppd  PTA for > 50 years.  - Remains quit.  - No change.    6. Hx of bladder CA  - s/p multiple rounds of BCG therapy.   7. LV clot - Laminated, small. -  On Eliquis for PAF.  - No clot on recent echo.   8. DM2 - On metformin. - Continue SGLT2i.  9. CKD 3b - Baseline SCr 1.7 - Labs today    Arvilla Meres, MD 09/24/22

## 2022-09-25 ENCOUNTER — Other Ambulatory Visit (HOSPITAL_COMMUNITY): Payer: Self-pay

## 2022-09-25 ENCOUNTER — Ambulatory Visit: Payer: Medicare Other | Attending: Student | Admitting: Student

## 2022-09-25 ENCOUNTER — Encounter: Payer: Self-pay | Admitting: Student

## 2022-09-25 VITALS — BP 142/70 | HR 75 | Ht 74.0 in | Wt 207.6 lb

## 2022-09-25 DIAGNOSIS — Z95810 Presence of automatic (implantable) cardiac defibrillator: Secondary | ICD-10-CM | POA: Insufficient documentation

## 2022-09-25 DIAGNOSIS — I4892 Unspecified atrial flutter: Secondary | ICD-10-CM | POA: Insufficient documentation

## 2022-09-25 DIAGNOSIS — I5022 Chronic systolic (congestive) heart failure: Secondary | ICD-10-CM | POA: Diagnosis not present

## 2022-09-25 DIAGNOSIS — I255 Ischemic cardiomyopathy: Secondary | ICD-10-CM | POA: Insufficient documentation

## 2022-09-25 LAB — CUP PACEART INCLINIC DEVICE CHECK
Battery Remaining Longevity: 74 mo
Battery Voltage: 2.94 V
Brady Statistic RV Percent Paced: 0.11 %
Date Time Interrogation Session: 20240528095113
HighPow Impedance: 75 Ohm
Implantable Lead Connection Status: 753985
Implantable Lead Implant Date: 20190724
Implantable Lead Location: 753860
Implantable Pulse Generator Implant Date: 20190724
Lead Channel Impedance Value: 323 Ohm
Lead Channel Impedance Value: 399 Ohm
Lead Channel Pacing Threshold Amplitude: 0.75 V
Lead Channel Pacing Threshold Pulse Width: 0.4 ms
Lead Channel Sensing Intrinsic Amplitude: 13.625 mV
Lead Channel Sensing Intrinsic Amplitude: 17.875 mV
Lead Channel Setting Pacing Amplitude: 2.5 V
Lead Channel Setting Pacing Pulse Width: 0.4 ms
Lead Channel Setting Sensing Sensitivity: 0.3 mV
Zone Setting Status: 755011

## 2022-09-25 NOTE — Patient Instructions (Signed)
Medication Instructions:  Your physician recommends that you continue on your current medications as directed. Please refer to the Current Medication list given to you today.  *If you need a refill on your cardiac medications before your next appointment, please call your pharmacy*  Lab Work: BMET, BNP, CMET, TSH, FreeT4--TODAY If you have labs (blood work) drawn today and your tests are completely normal, you will receive your results only by: MyChart Message (if you have MyChart) OR A paper copy in the mail If you have any lab test that is abnormal or we need to change your treatment, we will call you to review the results.  Follow-Up: At [facility], you and your health needs are our priority.  As part of our continuing mission to provide you with exceptional heart care, we have created designated Provider Care Teams.  These Care Teams include your primary Cardiologist (physician) and Advanced Practice Providers (APPs -  Physician Assistants and Nurse Practitioners) who all work together to provide you with the care you need, when you need it.  Your next appointment:   6-8 week(s)  Provider:   Sherryl Manges, MD to discuss CCM

## 2022-09-26 ENCOUNTER — Telehealth (HOSPITAL_COMMUNITY): Payer: Self-pay

## 2022-09-26 ENCOUNTER — Ambulatory Visit (HOSPITAL_COMMUNITY)
Admission: RE | Admit: 2022-09-26 | Discharge: 2022-09-26 | Disposition: A | Discharge status: Home / Self Care | Payer: Medicare Other | Source: Ambulatory Visit | Attending: Internal Medicine | Admitting: Internal Medicine

## 2022-09-26 ENCOUNTER — Encounter (HOSPITAL_COMMUNITY): Payer: Self-pay | Admitting: Internal Medicine

## 2022-09-26 ENCOUNTER — Ambulatory Visit (HOSPITAL_BASED_OUTPATIENT_CLINIC_OR_DEPARTMENT_OTHER)
Admission: RE | Admit: 2022-09-26 | Discharge: 2022-09-26 | Disposition: A | Discharge status: Home / Self Care | Payer: Medicare Other | Source: Ambulatory Visit | Attending: Internal Medicine | Admitting: Internal Medicine

## 2022-09-26 VITALS — BP 140/70 | HR 72 | Wt 209.0 lb

## 2022-09-26 DIAGNOSIS — I5022 Chronic systolic (congestive) heart failure: Secondary | ICD-10-CM | POA: Insufficient documentation

## 2022-09-26 DIAGNOSIS — Z7901 Long term (current) use of anticoagulants: Secondary | ICD-10-CM | POA: Diagnosis not present

## 2022-09-26 DIAGNOSIS — I10 Essential (primary) hypertension: Secondary | ICD-10-CM

## 2022-09-26 DIAGNOSIS — I4891 Unspecified atrial fibrillation: Secondary | ICD-10-CM | POA: Insufficient documentation

## 2022-09-26 DIAGNOSIS — E1122 Type 2 diabetes mellitus with diabetic chronic kidney disease: Secondary | ICD-10-CM

## 2022-09-26 DIAGNOSIS — I080 Rheumatic disorders of both mitral and aortic valves: Secondary | ICD-10-CM | POA: Diagnosis not present

## 2022-09-26 DIAGNOSIS — I483 Typical atrial flutter: Secondary | ICD-10-CM | POA: Diagnosis not present

## 2022-09-26 DIAGNOSIS — I130 Hypertensive heart and chronic kidney disease with heart failure and stage 1 through stage 4 chronic kidney disease, or unspecified chronic kidney disease: Secondary | ICD-10-CM | POA: Diagnosis not present

## 2022-09-26 DIAGNOSIS — Z8551 Personal history of malignant neoplasm of bladder: Secondary | ICD-10-CM

## 2022-09-26 DIAGNOSIS — N183 Chronic kidney disease, stage 3 unspecified: Secondary | ICD-10-CM

## 2022-09-26 DIAGNOSIS — I2510 Atherosclerotic heart disease of native coronary artery without angina pectoris: Secondary | ICD-10-CM | POA: Diagnosis not present

## 2022-09-26 DIAGNOSIS — I340 Nonrheumatic mitral (valve) insufficiency: Secondary | ICD-10-CM | POA: Diagnosis not present

## 2022-09-26 DIAGNOSIS — I513 Intracardiac thrombosis, not elsewhere classified: Secondary | ICD-10-CM

## 2022-09-26 DIAGNOSIS — N1832 Chronic kidney disease, stage 3b: Secondary | ICD-10-CM | POA: Insufficient documentation

## 2022-09-26 DIAGNOSIS — Z720 Tobacco use: Secondary | ICD-10-CM

## 2022-09-26 LAB — COMPREHENSIVE METABOLIC PANEL
ALT: 18 IU/L (ref 0–44)
AST: 18 IU/L (ref 0–40)
Albumin/Globulin Ratio: 1.7 (ref 1.2–2.2)
Albumin: 4 g/dL (ref 3.8–4.8)
Alkaline Phosphatase: 111 IU/L (ref 44–121)
BUN/Creatinine Ratio: 12 (ref 10–24)
BUN: 22 mg/dL (ref 8–27)
Bilirubin Total: 0.8 mg/dL (ref 0.0–1.2)
CO2: 22 mmol/L (ref 20–29)
Calcium: 9.1 mg/dL (ref 8.6–10.2)
Chloride: 99 mmol/L (ref 96–106)
Creatinine, Ser: 1.8 mg/dL — ABNORMAL HIGH (ref 0.76–1.27)
Globulin, Total: 2.4 g/dL (ref 1.5–4.5)
Glucose: 216 mg/dL — ABNORMAL HIGH (ref 70–99)
Potassium: 4.5 mmol/L (ref 3.5–5.2)
Sodium: 135 mmol/L (ref 134–144)
Total Protein: 6.4 g/dL (ref 6.0–8.5)
eGFR: 38 mL/min/{1.73_m2} — ABNORMAL LOW (ref >59)

## 2022-09-26 LAB — ECHOCARDIOGRAM COMPLETE
Calc EF: 30 %
MV M vel: 5.27 m/s
MV Peak grad: 110.9 mmHg
Radius: 0.3 cm
S' Lateral: 4.4 cm
Single Plane A2C EF: 27.3 %
Single Plane A4C EF: 33 %

## 2022-09-26 LAB — TSH: TSH: 1.89 u[IU]/mL (ref 0.450–4.500)

## 2022-09-26 LAB — T4, FREE: Free T4: 1.64 ng/dL (ref 0.82–1.77)

## 2022-09-26 LAB — PRO B NATRIURETIC PEPTIDE: NT-Pro BNP: 2187 pg/mL — ABNORMAL HIGH (ref 0–486)

## 2022-09-26 MED ORDER — ISOSORB DINITRATE-HYDRALAZINE 20-37.5 MG PO TABS
0.5000 | ORAL_TABLET | Freq: Three times a day (TID) | ORAL | 3 refills | Status: DC
Start: 2022-09-26 — End: 2023-01-08

## 2022-09-26 MED ORDER — PERFLUTREN LIPID MICROSPHERE
1.0000 mL | INTRAVENOUS | Status: DC | PRN
  Administered 2022-09-26: 5 mL via INTRAVENOUS

## 2022-09-26 NOTE — Progress Notes (Signed)
Remote ICD transmission.   

## 2022-09-26 NOTE — Patient Instructions (Signed)
START Bidil (1/2 Tab) Three times a day   Your physician recommends that you schedule a follow-up appointment in: 3 months  If you have any questions or concerns before your next appointment please send [place] a message through [place] or call our office at [phone].    TO LEAVE A MESSAGE FOR THE NURSE SELECT OPTION 2, PLEASE LEAVE A MESSAGE INCLUDING: YOUR NAME DATE OF BIRTH CALL BACK NUMBER REASON FOR CALL**this is important as we prioritize the call backs  YOU WILL RECEIVE A CALL BACK THE SAME DAY AS LONG AS YOU CALL BEFORE 4:00 PM  At the [facility], you and your health needs are our priority. As part of our continuing mission to provide you with exceptional heart care, we have created designated Provider Care Teams. These Care Teams include your primary Cardiologist (physician) and Advanced Practice Providers (APPs- Physician Assistants and Nurse Practitioners) who all work together to provide you with the care you need, when you need it.   You may see any of the following providers on your designated Care Team at your next follow up: Dr Arvilla Meres Dr Marca Ancona Dr. Marcos Eke, NP Robbie Lis, [place] [facility], [place] Brynda Peon, NP Karle Plumber, PharmD   Please be sure to bring in all your medications bottles to every appointment.    Thank you for choosing [facility]

## 2022-09-26 NOTE — Progress Notes (Signed)
Echocardiogram 2D Echocardiogram has been performed.  [name] [name] 09/26/2022, 10:02 AM

## 2022-09-26 NOTE — Telephone Encounter (Signed)
[name] [name] just spoke with patient and wants to schedule him for CPX. Patient happy with that plan and asked [place] to send him a my chart message of when the test is

## 2022-09-26 NOTE — Addendum Note (Signed)
Encounter addended by: Jacklynn Ganong, FNP on: 09/26/2022 3:16 PM  Actions taken: Clinical Note Signed

## 2022-09-29 ENCOUNTER — Encounter (HOSPITAL_COMMUNITY): Payer: Self-pay | Admitting: Internal Medicine

## 2022-09-30 ENCOUNTER — Encounter (HOSPITAL_COMMUNITY): Payer: Self-pay | Admitting: Internal Medicine

## 2022-10-01 ENCOUNTER — Encounter (HOSPITAL_COMMUNITY): Payer: Self-pay | Admitting: Internal Medicine

## 2022-10-01 NOTE — Telephone Encounter (Signed)
@  Dr Gala Romney -multiple concerns of elevate b/p per patient  09/26/22 Add BiDil 0.5 tab tid for afterload reduction.  B/p 140/70  Per Jessica,NP  09/29/22 @ 834 B/p range 170/70 for two days per patient   09/29/22 @ 1101 B/p 155/64  09/30/22 @ 1016 B/p 136/52   Please advise

## 2022-10-03 ENCOUNTER — Other Ambulatory Visit (HOSPITAL_COMMUNITY): Payer: Self-pay | Admitting: Internal Medicine

## 2022-10-05 ENCOUNTER — Encounter (HOSPITAL_COMMUNITY): Payer: Self-pay

## 2022-10-05 ENCOUNTER — Encounter (HOSPITAL_COMMUNITY): Payer: Self-pay | Admitting: Internal Medicine

## 2022-10-08 ENCOUNTER — Telehealth (HOSPITAL_COMMUNITY): Payer: Self-pay

## 2022-10-08 MED ORDER — AMLODIPINE BESYLATE 5 MG PO TABS: 5.0000 mg | ORAL_TABLET | Freq: Every day | ORAL | 3 refills | Status: DC

## 2022-10-08 NOTE — Telephone Encounter (Signed)
  Received Cardiac Clearance form from [facility] requesting patient be cleared for the following procedure single tooth extraction under conscious sedation with IV midazolam and fentanyl. Form was placed in Prince Rome, NP folder on 10/Jun/2024 for signature. Will update chart once clearance has been reviewed and signed by provider.

## 2022-10-09 ENCOUNTER — Encounter (HOSPITAL_COMMUNITY): Payer: Medicare Other

## 2022-10-10 NOTE — Telephone Encounter (Signed)
Medical clearance form was signed by Prince Rome, NP and successfully faxed to [phone] on Wednesday, June 12,. Form will be scanned into patients chart.

## 2022-10-16 ENCOUNTER — Other Ambulatory Visit (HOSPITAL_COMMUNITY): Payer: Self-pay | Admitting: Family Medicine

## 2022-10-17 ENCOUNTER — Encounter (HOSPITAL_COMMUNITY): Payer: Medicare Other

## 2022-10-30 ENCOUNTER — Encounter (HOSPITAL_COMMUNITY): Payer: Self-pay | Admitting: Cardiology

## 2022-10-30 ENCOUNTER — Encounter (HOSPITAL_COMMUNITY): Payer: Self-pay | Admitting: *Deleted

## 2022-10-30 ENCOUNTER — Ambulatory Visit (HOSPITAL_COMMUNITY): Payer: Medicare Other | Attending: Family Medicine

## 2022-10-30 ENCOUNTER — Telehealth (HOSPITAL_COMMUNITY): Payer: Self-pay | Admitting: Cardiology

## 2022-10-30 DIAGNOSIS — I5022 Chronic systolic (congestive) heart failure: Secondary | ICD-10-CM

## 2022-10-30 MED ORDER — AMIODARONE HCL 200 MG PO TABS: 200.0000 mg | ORAL_TABLET | Freq: Two times a day (BID) | ORAL | Status: DC

## 2022-10-30 MED ORDER — FUROSEMIDE 40 MG PO TABS: 40.0000 mg | ORAL_TABLET | Freq: Every day | ORAL | 3 refills | Status: DC

## 2022-10-30 NOTE — Telephone Encounter (Signed)
Pt aware via mychart message 

## 2022-10-30 NOTE — Progress Notes (Unsigned)
Pt in for CPX, HR 116 BP 92/56, increased sob and abd dist, EKG revealed Aflutter, device interrogated with AT/AF in/out since 6/23, optivol elevated   Amy Clegg, NP evaluated pt-->increase Amio to 200 mg BID, increase Lasix to 40 mg every day, f/u in 1 week   Pt aware, agreeable, and verbalized understanding   

## 2022-10-30 NOTE — Patient Instructions (Signed)
Increase Amiodarone to 200 mg Twice daily    Increase Furosemide to 40 mg Daily   Do the following things EVERYDAY: Weigh yourself in the morning before breakfast. Write it down and keep it in a log. Take your medicines as prescribed Eat low salt foods--Limit salt (sodium) to 2000 mg per day.  Stay as active as you can everyday Limit all fluids for the day to less than 2 liters   Your physician recommends that you schedule a follow-up appointment in: 1 week   If you have any questions or concerns before your next appointment please send [place] a message through [place] or call our office at [phone].     TO LEAVE A MESSAGE FOR THE NURSE SELECT OPTION 2, PLEASE LEAVE A MESSAGE INCLUDING: YOUR NAME DATE OF BIRTH CALL BACK NUMBER REASON FOR CALL**this is important as we prioritize the call backs   YOU WILL RECEIVE A CALL BACK THE SAME DAY AS LONG AS YOU CALL BEFORE 4:00 PM   At the [facility], you and your health needs are our priority. As part of our continuing mission to provide you with exceptional heart care, we have created designated Provider Care Teams. These Care Teams include your primary Cardiologist (physician) and Advanced Practice Providers (APPs- Physician Assistants and Nurse Practitioners) who all work together to provide you with the care you need, when you need it.    You may see any of the following providers on your designated Care Team at your next follow up: Dr Arvilla Meres Dr Marca Ancona Dr. Marcos Eke, NP Robbie Lis, [place] [facility] Dwale Hawthorne, [place] Brynda Peon, NP Karle Plumber, PharmD     Please be sure to bring in all your medications bottles to every appointment.      Thank you for choosing [facility]

## 2022-10-30 NOTE — Telephone Encounter (Signed)
I have another question. I'm told to take amiodorone 200 twice per day. When I got home and checked, I discovered I have been mistakenly taking 100 mg only once per day instead of twice daily as prescribed. So, does this change things? Should I go ahead with the 200 twice daily, or what?    Seen during cpx appt  Noted aflutter-see note for details

## 2022-10-30 NOTE — Patient Instructions (Signed)
Increase Amiodarone to 200 mg Twice daily    Increase Furosemide to 40 mg Daily   Do the following things EVERYDAY: Weigh yourself in the morning before breakfast. Write it down and keep it in a log. Take your medicines as prescribed Eat low salt foods--Limit salt (sodium) to 2000 mg per day.  Stay as active as you can everyday Limit all fluids for the day to less than 2 liters   Your physician recommends that you schedule a follow-up appointment in: 1 week   If you have any questions or concerns before your next appointment please send us a message through mychart or call our office at [phone].     TO LEAVE A MESSAGE FOR THE NURSE SELECT OPTION 2, PLEASE LEAVE A MESSAGE INCLUDING: YOUR NAME DATE OF BIRTH CALL BACK NUMBER REASON FOR CALL**this is important as we prioritize the call backs   YOU WILL RECEIVE A CALL BACK THE SAME DAY AS LONG AS YOU CALL BEFORE 4:00 PM   At the [facility], you and your health needs are our priority. As part of our continuing mission to provide you with exceptional heart care, we have created designated Provider Care Teams. These Care Teams include your primary Cardiologist (physician) and Advanced Practice Providers (APPs- Physician Assistants and Nurse Practitioners) who all work together to provide you with the care you need, when you need it.    You may see any of the following providers on your designated Care Team at your next follow up: Dr Daniel Bensimhon Dr Dalton McLean Dr. Aditya Sabharwal Amy Clegg, NP Brittainy Simmons, PA Jessica Milford,NP Lindsay Finch, PA Alma Diaz, NP Lauren Kemp, PharmD     Please be sure to bring in all your medications bottles to every appointment.      Thank you for choosing [facility]      

## 2022-10-30 NOTE — Progress Notes (Signed)
Pt in for CPX, HR 116 BP 92/56, increased sob and abd dist, EKG revealed Aflutter, device interrogated with AT/AF in/out since 6/23, optivol elevated   Tonye Becket, NP evaluated pt-->increase Amio to 200 mg BID, increase Lasix to 40 mg every day, f/u in 1 week   Pt aware, agreeable, and verbalized understanding

## 2022-11-05 ENCOUNTER — Telehealth (HOSPITAL_COMMUNITY): Payer: Self-pay

## 2022-11-05 NOTE — Telephone Encounter (Signed)
Called to confirm Heart & Vascular APP appointment. Patient reminded to bring all medications and pill box organizer with them. Confirmed patient has transportation. Gave directions, instructed to utilize valet parking.  Confirmed appointment prior to ending call.

## 2022-11-06 ENCOUNTER — Telehealth (HOSPITAL_COMMUNITY): Payer: Self-pay | Admitting: Vascular Surgery

## 2022-11-06 ENCOUNTER — Encounter: Payer: Self-pay | Admitting: Internal Medicine

## 2022-11-06 ENCOUNTER — Ambulatory Visit
Admission: RE | Admit: 2022-11-06 | Discharge: 2022-11-06 | Disposition: A | Discharge status: Home / Self Care | Payer: Medicare Other | Source: Ambulatory Visit | Attending: Cardiology | Admitting: Cardiology

## 2022-11-06 ENCOUNTER — Encounter (HOSPITAL_COMMUNITY): Payer: Self-pay

## 2022-11-06 ENCOUNTER — Ambulatory Visit (INDEPENDENT_AMBULATORY_CARE_PROVIDER_SITE_OTHER): Payer: Medicare Other | Admitting: Internal Medicine

## 2022-11-06 VITALS — BP 102/66 | HR 107 | Wt 215.8 lb

## 2022-11-06 VITALS — BP 104/58 | HR 107 | Ht 74.0 in | Wt 217.0 lb

## 2022-11-06 DIAGNOSIS — Z7901 Long term (current) use of anticoagulants: Secondary | ICD-10-CM | POA: Diagnosis not present

## 2022-11-06 DIAGNOSIS — E1122 Type 2 diabetes mellitus with diabetic chronic kidney disease: Secondary | ICD-10-CM | POA: Insufficient documentation

## 2022-11-06 DIAGNOSIS — I480 Paroxysmal atrial fibrillation: Secondary | ICD-10-CM | POA: Diagnosis not present

## 2022-11-06 DIAGNOSIS — I5042 Chronic combined systolic (congestive) and diastolic (congestive) heart failure: Secondary | ICD-10-CM

## 2022-11-06 DIAGNOSIS — Z87891 Personal history of nicotine dependence: Secondary | ICD-10-CM | POA: Diagnosis not present

## 2022-11-06 DIAGNOSIS — N1832 Chronic kidney disease, stage 3b: Secondary | ICD-10-CM | POA: Insufficient documentation

## 2022-11-06 DIAGNOSIS — Z79899 Other long term (current) drug therapy: Secondary | ICD-10-CM | POA: Insufficient documentation

## 2022-11-06 DIAGNOSIS — Z8551 Personal history of malignant neoplasm of bladder: Secondary | ICD-10-CM | POA: Diagnosis not present

## 2022-11-06 DIAGNOSIS — Z7984 Long term (current) use of oral hypoglycemic drugs: Secondary | ICD-10-CM | POA: Diagnosis not present

## 2022-11-06 DIAGNOSIS — I5022 Chronic systolic (congestive) heart failure: Secondary | ICD-10-CM | POA: Insufficient documentation

## 2022-11-06 DIAGNOSIS — Z951 Presence of aortocoronary bypass graft: Secondary | ICD-10-CM | POA: Insufficient documentation

## 2022-11-06 DIAGNOSIS — I340 Nonrheumatic mitral (valve) insufficiency: Secondary | ICD-10-CM | POA: Diagnosis not present

## 2022-11-06 DIAGNOSIS — I4892 Unspecified atrial flutter: Secondary | ICD-10-CM

## 2022-11-06 DIAGNOSIS — I483 Typical atrial flutter: Secondary | ICD-10-CM | POA: Diagnosis not present

## 2022-11-06 DIAGNOSIS — I2510 Atherosclerotic heart disease of native coronary artery without angina pectoris: Secondary | ICD-10-CM | POA: Diagnosis not present

## 2022-11-06 DIAGNOSIS — I130 Hypertensive heart and chronic kidney disease with heart failure and stage 1 through stage 4 chronic kidney disease, or unspecified chronic kidney disease: Secondary | ICD-10-CM | POA: Diagnosis not present

## 2022-11-06 LAB — BASIC METABOLIC PANEL
Anion gap: 12 (ref 5–15)
BUN: 28 mg/dL — ABNORMAL HIGH (ref 8–23)
CO2: 23 mmol/L (ref 22–32)
Calcium: 8.9 mg/dL (ref 8.9–10.3)
Chloride: 102 mmol/L (ref 98–111)
Creatinine, Ser: 2.27 mg/dL — ABNORMAL HIGH (ref 0.61–1.24)
GFR, Estimated: 29 mL/min — ABNORMAL LOW (ref >60)
Glucose, Bld: 167 mg/dL — ABNORMAL HIGH (ref 70–99)
Potassium: 4.4 mmol/L (ref 3.5–5.1)
Sodium: 137 mmol/L (ref 135–145)

## 2022-11-06 LAB — BRAIN NATRIURETIC PEPTIDE: B Natriuretic Peptide: 481.6 pg/mL — ABNORMAL HIGH (ref 0.0–100.0)

## 2022-11-06 MED ORDER — TORSEMIDE 20 MG PO TABS: 20.0000 mg | ORAL_TABLET | Freq: Two times a day (BID) | ORAL | 3 refills | Status: DC

## 2022-11-06 NOTE — Progress Notes (Signed)
Advanced [facility] Failure Clinic Note   PCP: [name], MD HF Cardiologist: Dr. Gala Romney   HPI: [name] [name] is a 77 y.o. 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   Pt admitted in 12/18 with anterior STEMI and developed cardiogenic shock requiring Impella support. Urgent cath showed severe 3v CAD as below. While on impella, pt developed sepsis with WBC ~ 40K, UCx + pseudomonas. Treated for AECOPD and completed ABX therapy. BCx negative. Stabilized and underwent CABG x 2 with LIMA to LAD and SVG to OM on 04/08/2017. Pt transiently required milrinone but weaned prior to discharge. Required amiodarone for brief post op afib, but had no recurrence.   Echo 08/07/17 EF 35-40% laminated apical clot. (Dr.Bensimhon felt ~ 25-30%) and moderate to severe MR.   cMRI 6/19 EF 30%, laminated apical thrombus, normal RV size and function, LGE pattern suggests infarction in LAD territory, unlikely to improve with revascularization  S/p Medtronic ICD with Dr Graciela Husbands 11/20/17.  Saw Dr. Graciela Husbands on 01/07/20 and was in AF with RVR. Potassium also high so Inspra stopped. Dr. Odessa Fleming note also mentions chronotropic incompetence with exercise. Carvedilol cut back to 3.125 bid.   Follow up 7/22, stable NYHA II, volume good. Echo showed EF 20-25% , RV mildly reduced, no LV clot.  Admitted 4/23 with AFL with RVR. Underwent DCCV to NSR. Presented back to ED next day with a/c CHF. Given IV lasix with improvement in symptoms. Discharged home on Lasix 40 mg daily x 2 days, then PRN. Post hospital follow up, he was volume overloaded and back in AFL RVR. Amiodarone started and arranged for DCCV.  S/p DCCV 08/10/21 to NSR.  Follow up 9/23, NYHA II, Coreg stopped with bradycardia, started on Farxiga and reduced lasix dose. Marcelline Deist ultimately discontinued  given recurrent yeast infections   Echo (9/23) showed EF 20-25%, RV mildly reduced, moderate MR, mild to moderate AI  Follow up 11/23, NYHA II and volume stable.   Saw EP 5/24, planning to discuss CCM.  Last [facility] f/u 5/24 he endorsed decrease in exercise tolerance and SOB only walking 100 ft. Volume status was stable. Echo was repeated. LVEF stable 25-30%. RV mildly reduced. IVC not dilated. Given change in functional capacity he was ordered to undergo CPX testing.   He presented 7/2 for planned CPX testing. On arrival, he was noted to be tachycardic w/ soft BP, HR 116 bpm and BP 92/56. He also endorsed increased SOB and abdominal distention. EKG showed Aflutter w/ RVR. His device was interrogated and showed episodes of paroxysmal AT/AF since 6/23. Optivol fluid index was also elevated, suggestive of fluid accumulation. CPX was canceled. He was instructed to increase Lasix to 40 mg daily and increase amiodarone to 200 mg bid and to f/u in clinic in 1 wk.   He presents today for 1 wk f/u. Still SOB w/ activity, NYHA Class III. Reports variable response to PO Lasix. Urinates more some days than others. No LEE. C/w abdominal distention. Wt up 8 lb from b/l. ICD device interrogation continues to show elevated fluid index above threshold. Also c/w PAF though episodes and duration are both less frequent. Episodes now lasting < 1/hr at a time. He is currently in AF by EKG today, ventricular rate 104 bpm.  BP soft 102/66 but no orthostatic symptoms.  He has f/u in EP clinic to see Dr. Graciela Husbands this afternoon.   Wt Readings from Last 3 Encounters:  11/06/22 97.9 kg (215 lb 12.8 oz)  09/26/22 94.8 kg (209 lb)  09/25/22 94.2 kg (207 lb 9.6 oz)    Cardiac Studies:  - Echo (9/23): EF 20-25%, RV mildly reduced, moderate MR, mild to moderate AI  - Echo (11/16/20): EF 20-25% RV mildly reduced No LV clot   - Echo (8/20): EF 25-30% RV ok. Small apical thrombus.  - L/RHC 03/31/2017 Prox RCA lesion is 100%  stenosed. Dist RCA lesion is 100% stenosed (retrograde filling from collaterals does not go beyond the distal vessel) -->faint collateral filling to the distal PL and PDA from septal perforators and a diffusely diseased AV groove circumflex LAV Groove lesion is 90% stenosed. Prox LAD-1 lesion is 95% stenosed. Prox LAD-2 lesion is 85% stenosed. Heavily calcified, tandem lesions in very tortuous segment of the vessel. Prox Cx to Mid Cx lesion is 70% stenosed. Heavily calcified There is severe left ventricular systolic dysfunction. The left ventricular ejection fraction is less than 25% by visual estimate. LV end diastolic pressure is severely elevated -prior to Impella insertion Hemodynamic findings consistent with moderate pulmonary hypertension. Successful Impella insertion with 3.5-3.6 LPM flow's  - Echo 04/01/2017 EF 15-20%   - Echo 04/16/17 (post op) Improved to 25-30% on 04/16/17  - Echo 08/07/17: EF 35-40%, mod to severe MR  Review of systems complete and found to be negative unless listed in HPI.   Past Medical History:  Diagnosis Date   AICD (automatic cardioverter/defibrillator) present    Arthritis    Bladder cancer (HCC)    Borderline glaucoma    Chronic systolic (congestive) heart failure (HCC)    Coronary artery disease    Diabetes mellitus type 2, controlled (HCC) ORAL MED   Essential hypertension    Heart murmur    Hepatitis    Medical history non-contributory    Paroxysmal A-fib (HCC)    Paroxysmal A-fib (HCC) 08/20/2020   Peripheral vascular disease (HCC)    Pneumonia    Current Outpatient Medications  Medication Sig Dispense Refill   amiodarone (PACERONE) 200 MG tablet Take 1 tablet (200 mg total) by mouth 2 (two) times daily.     amLODipine (NORVASC) 5 MG tablet Take 1 tablet (5 mg total) by mouth daily. 90 tablet 3   atorvastatin (LIPITOR) 80 MG tablet TAKE 1 TABLET BY MOUTH EVERY DAY 90 tablet 3   ELIQUIS 5 MG TABS tablet TAKE 1 TABLET TWICE A DAY 180 tablet  3   ENTRESTO 97-103 MG TAKE 1 TABLET TWICE A DAY 180 tablet 3   furosemide (LASIX) 40 MG tablet Take 1 tablet (40 mg total) by mouth daily. 39 tablet 3   isosorbide-hydrALAZINE (BIDIL) 20-37.5 MG tablet Take 0.5 tablets by mouth 3 (three) times daily. 100 tablet 3   metFORMIN (GLUCOPHAGE-XR) 500 MG 24 hr tablet Take 1 tablet (500 mg total) by mouth daily with breakfast.     No current facility-administered medications for this encounter.   Allergies  Allergen Reactions   Spironolactone Other (See Comments)    Painful gynecomastia   Niacin And Related Hives   Social History   Socioeconomic History   Marital status: Married    Spouse name: Not on file   Number of children: Not on file   Years of education: Not on file   Highest education level: Not on file  Occupational History   Occupation: retired  Tobacco Use   Smoking status: Former    Packs/day: 1.00    Years: 40.00    Additional pack years: 0.00    Total pack years: 40.00    Types: Cigarettes    Quit date: 03/31/2017    Years since quitting: 5.6   Smokeless tobacco: Never  Vaping Use   Vaping Use: Never used  Substance and Sexual Activity   Alcohol use: Not Currently    Comment: rarely   Drug use: No   Sexual activity: Not on file  Other Topics Concern   Not on file  Social History Narrative   Not on file   Social Determinants of Health   Financial Resource Strain: Not on file  Food Insecurity: No Food Insecurity (01/04/2022)   Hunger Vital Sign    Worried About Running Out of Food in the Last Year: Never true    Ran Out of Food in the Last Year: Never true  Transportation Needs: No Transportation Needs (01/04/2022)   PRAPARE - Administrator, Civil Service (Medical): No    Lack of Transportation (Non-Medical): No  Physical Activity: Inactive (07/11/2017)   Exercise Vital Sign    Days of Exercise per Week: 0 days    Minutes of Exercise per Session: 0 min  Stress: No Stress Concern Present  (07/11/2017)   [employer] of Occupational Health - Occupational Stress Questionnaire    Feeling of Stress : Only a little  Social Connections: Not on file  Intimate Partner Violence: Not on file   Family History  Problem Relation Age of Onset   Heart attack Sister    Heart disease Sister    There were no vitals taken for this visit.  Wt Readings from Last 3 Encounters:  09/26/22 94.8 kg (209 lb)  09/25/22 94.2 kg (207 lb 9.6 oz)  03/07/22 96.6 kg (213 lb)    PHYSICAL EXAM: General:  Well appearing. No respiratory difficulty HEENT: normal Neck: supple. JVD 9 cm. Carotids 2+ bilat; no bruits. No lymphadenopathy or thyromegaly appreciated. Cor: PMI nondisplaced. Irreguarlly irregular rate & rhythm. No rubs, gallops or murmurs. Lungs: decreased BS at the bases bilaterally  Abdomen: soft, nontender, +distended. No hepatosplenomegaly. No bruits or masses. Good bowel sounds. Extremities: no cyanosis, clubbing, rash, edema Neuro: alert & oriented x 3, cranial nerves grossly intact. moves all 4 extremities w/o difficulty. Affect pleasant.   ECG (personally reviewed: Aflutter 104 bpm   Device interrogation (personally reviewed): OptiVol fluid index elevated and above threshold c/w volume overload, no VT, + PAF, 99% v-pacing   ASSESSMENT & PLAN: 1. Chronic systolic CHF due to ICM  - EF 12/04/55 EF 15-20% -> Improved to 25-30% on 04/16/17 Post op CABG as below.  - Echo 08/07/17 read as EF 35-40% but Dr Gala Romney reviewed personally and EF 25-30% (reviewed with Dr. Delton See who agrees) + laminated apical clot and moderate to severe MR. RV ok.  - Cardiac MRI 09/2017 EF 30%, laminated apical thrombus, normal RV size and function, LGE pattern suggests infarction in LAD territory, unlikely to improve with revascularization - S/p Medtronic ICD with Dr Graciela Husbands 11/20/17 - Echo (8/20): EF 25-30% RV ok. Mild MR. Small apical thrombus.  - Echo (11/16/20): EF 20-25% RV mildly reduced No LV clot  - Echo  today 09/26/22, results pending, but EF appears similar to prior. - NYHA III-IIIb. Volume overloaded on exam and by OptiVol. Abdominal edema > LEE + supar response to PO Lasix.  - Stop Lasix - Transition  to torsemide for better gut absorption, 20 mg daily  - Continue Entresto 97/103 mg bid - Off Farxiga/Jardiance with yeast infections. - Off eplerenone due hyperkalemia (painful gynecomastia with spiro). - Off Coreg with bradycardia.  - Continue BiDil 0.5 tab tid for afterload reduction. - On-going discussions about CCM vs BAT. He has follow up today to discuss CCM further with Dr. Graciela Husbands. I think this is a reasonable option for him, but he may be LVAD candidate. Will re-attempt CXP once better diuresed to help risk stratify for advanced therpaies. May need RHC down the road. We will reassess response to diuretic dose adjustment in 1 wk.   2. Paroxysmal Atrial fibrillation/AFL RVR - AFL with RVR 4/23, s/p DCCV -->NSR - s/p DCCV 08/14/21-->NSR - was in persistent Afib mid June w/ subsequent fluid overload. Now w/ PAF w/ most recent episodes < 1 hr in duration on device interrogation.   - Amiodarone has been increased to 200 mg bid. May need further titration of 400 mg bid for better supression. He is seeing Dr. Graciela Husbands today as well. Will defer Amiodarone increase to Dr. Graciela Husbands. May also consider AFL/AF ablation in light of [place]-AF data. Dr. Gala Romney discussed his concerns over procedural GA with him and Dr. Graciela Husbands. If we can keep him in NSR on amio, this may be best option but if he continues w/ recurrent AF and CHF exacerbations then likely to warrant RFA.  - Continue Eliquis. No bleeding issues.   3. Mitral regurgitation - functional MR  - Moderate to severe on echo 07/2017 - Mild on echo 7/22. - Moderate on echo 9/23 - Trivial on echo 5/24 - HF optimization per above    4. CAD - s/p CABG with LIMA to LAD and SVG to OM on 04/08/2017 - denies CP  - Continue statin. - off ? blocker w/  bradycardia  - No ASA with Eliquis use.   5. Tobacco abuse  - Smoked 1 ppd PTA for > 50 years.  - Remains quit.  - No change.    6. Hx of bladder CA  - s/p multiple rounds of BCG therapy.   7. LV clot - Laminated, small. - On Eliquis for PAF.  - No clot on recent echo.   8. DM2 - On metformin. - Off SGLT2i with yeast infections.  9. CKD 3b - Baseline SCr 1.7 - Most recent SCr 1.8 - Check BMP today and again in 7 days w/ diuretic increase   10. HTN - soft but stable. No orthostatic symptoms - continue GDMT per above   F/u in 7-10 days to reassess volume status   Robbie Lis, PA-C 11/06/22

## 2022-11-06 NOTE — Patient Instructions (Signed)
Medication Changes:  STOP Furosemide  START Torsemide 20 mg Daily  Discuss Amiodarone dosing with Dr Graciela Husbands today at visit  Lab Work:  Labs done today, your results will be available in MyChart, we will contact you for abnormal readings.  Testing/Procedures:  none  Referrals:  none  Special Instructions // Education:  Do the following things EVERYDAY: Weigh yourself in the morning before breakfast. Write it down and keep it in a log. Take your medicines as prescribed Eat low salt foods--Limit salt (sodium) to 2000 mg per day.  Stay as active as you can everyday Limit all fluids for the day to less than 2 liters   Follow-Up in: 1-2 weeks   At the [facility], you and your health needs are our priority. We have a designated team specialized in the treatment of Heart Failure. This Care Team includes your primary Heart Failure Specialized Cardiologist (physician), Advanced Practice Providers (APPs- Physician Assistants and Nurse Practitioners), and Pharmacist who all work together to provide you with the care you need, when you need it.   You may see any of the following providers on your designated Care Team at your next follow up:  Dr. Arvilla Meres Dr. Marca Ancona Dr. Marcos Eke, NP Robbie Lis, [place] [facility] [place], [place] Brynda Peon, NP Karle Plumber, PharmD   Please be sure to bring in all your medications bottles to every appointment.   Need to Contact us:  If you have any questions or concerns before your next appointment please send [place] a message through [place] or call our office at [phone].    TO LEAVE A MESSAGE FOR THE NURSE SELECT OPTION 2, PLEASE LEAVE A MESSAGE INCLUDING: YOUR NAME DATE OF BIRTH CALL BACK NUMBER REASON FOR CALL**this is important as we prioritize the call backs  YOU WILL RECEIVE A CALL BACK THE SAME DAY AS LONG AS YOU CALL BEFORE 4:00 PM

## 2022-11-06 NOTE — Patient Instructions (Signed)
Medication Instructions:  Your physician recommends that you continue on your current medications as directed. Please refer to the Current Medication list given to you today.  *If you need a refill on your cardiac medications before your next appointment, please call your pharmacy*  Lab Work: BMET and CBC  Testing/Procedures: Your physician has recommended that you have a Cardioversion (DCCV). Electrical Cardioversion uses a jolt of electricity to your heart either through paddles or wired patches attached to your chest. This is a controlled, usually prescheduled, procedure. Defibrillation is done under light anesthesia in the hospital, and you usually go home the day of the procedure. This is done to get your heart back into a normal rhythm. You are not awake for the procedure. Please see the instruction sheet given to you today.  Your physician has recommended that you have a cardiopulmonary stress test (CPX) in 3 weeks. CPX testing is a non-invasive measurement of heart and lung function. It replaces a traditional treadmill stress test. This type of test provides a tremendous amount of information that relates not only to your present condition but also for future outcomes. This test combines measurements of you ventilation, respiratory gas exchange in the lungs, electrocardiogram (EKG), blood pressure and physical response before, during, and following an exercise protocol.  CCM (contract contractility modulation) implant: September 6th at 7:30am. Please arrive at the Main Entrance A at [facility]: [address] [place], [place] [address] at 6:30 AM    Follow-Up: At [facility], you and your health needs are our priority.  As part of our continuing mission to provide you with exceptional heart care, we have created designated Provider Care Teams.  These Care Teams include your primary Cardiologist (physician) and Advanced Practice Providers (APPs -  Physician Assistants and  Nurse Practitioners) who all work together to provide you with the care you need, when you need it.  Your next appointment:   We will call you to schedule your follow up appointments.    Dear [name] [name]  You are scheduled for a Cardioversion on Friday, July 12 with Dr. Wyline Mood.  Please arrive at the [facility] (Main Entrance A) at [facility]: [address] [place], [place] [address] at 12:00 PM (This time is 1 hour(s) before your procedure to ensure your preparation). Free valet parking service is available. You will check in at ADMITTING. The support person will be asked to wait in the waiting room.  It is OK to have someone drop you off and come back when you are ready to be discharged.     DIET:  Nothing to eat or drink after midnight except a sip of water with medications (see medication instructions below)  MEDICATION INSTRUCTIONS: !!IF ANY NEW MEDICATIONS ARE STARTED AFTER TODAY, PLEASE NOTIFY YOUR PROVIDER AS SOON AS POSSIBLE!!  FYI: Medications such as Semaglutide (Ozempic, [place]), Tirzepatide (Mounjaro, Zepbound), Dulaglutide (Trulicity), etc ("GLP1 agonists") AND Canagliflozin (Invokana), Dapagliflozin (Farxiga), Empagliflozin (Jardiance), Ertugliflozin (Steglatro), Bexagliflozin [employer]) or any combination with one of these drugs such as Invokamet (Canagliflozin/Metformin), Synjardy (Empagliflozin/Metformin), etc ("SGLT2 inhibitors") must be held around the time of a procedure. This is not a comprehensive list of all of these drugs. Please review all of your medications and talk to your provider if you take any one of these. If you are not sure, ask your provider.   Hold your torsemide on the morning of your procedure. All other medications may be taken with a sip of water  Continue  taking your anticoagulant (blood thinner): Apixaban (Eliquis).  You will need to continue this after your procedure until you are told by your provider that it is safe to stop.    LABS:  TODAY  FYI:  For your safety, and to allow [place] to monitor your vital signs accurately during the surgery/procedure we request: If you have artificial nails, gel coating, SNS etc, please have those removed prior to your surgery/procedure. Not having the nail coverings /polish removed may result in cancellation or delay of your surgery/procedure.  You must have a responsible person to drive you home and stay in the waiting area during your procedure. Failure to do so could result in cancellation.  Bring your insurance cards.  *Special Note: Every effort is made to have your procedure done on time. Occasionally there are emergencies that occur at the hospital that may cause delays. Please be patient if a delay does occur.

## 2022-11-06 NOTE — Progress Notes (Signed)
Patient Care Team: [name], MD as PCP - General (Family Medicine) Bensimhon, Bevelyn Buckles, MD as PCP - Advanced Heart Failure (Cardiology) Bensimhon, Bevelyn Buckles, MD as PCP - Cardiology (Cardiology) Duke Salvia, MD as PCP - Electrophysiology (Cardiology)   HPI  [name] [name] is a 77 y.o. male seen in followup for ICD implanted for primary prevention 7/19.  Has Ischemic cardiomyopathy having presented 12/18 with anterior MI and shock requiring impella support.  Further complicated by sepsis.  S/P CABG with postop afib    He was found to have a laminated LV thrombuS>>treated with apixaban.  08/07/2021 seen in ER for acute congestive failure found to be in atrial flutter (ECG reviewed and was atypical) and was cardioverted put on a diuretic and discharged.  He was seen by heart failure 08/10/2021 had redeveloped atrial flutter and was cardioverted again (ECG is not available) >>> on amiodarone--concerns with GA has taken Castle-AF ablation off the table  Considering for CCM; CHF consdiering also LVAD   The patient denies chest pain,, nocturnal dyspnea, orthopnea or peripheral edema.  There have been no palpitations, lightheadedness or syncope.  Complains of fatigue and dyspnea on exertion.  This has been particularly noteworthy over the last 3 weeks.  (See below)..  Cardiopulmonary testing.  Will schedule but had to be canceled because of the onset of atrial flutter amiodarone dose was increased  Seen in heart failure clinic this morning furosemide was changed to torsemide.    DATE TEST EF    12/18 LHC  25 % RCA T; LAD 95; Cx 70%  4/19 Echo  25-30% Mod-sev MR  6/19 cMRI 30%    8/20 Echo  25-30%   7/22 Echo  20-25%   9/23 Echo  20-25% MR mod         Date Cr K Hgb TSH LFTs  7/19 1.18 4.7 12.5    8/20  1.41 4.9<<5.3 15.7    10/21 1.41 4.9 16.1    5/23 1.88 5.0 16.7    Thromboembolic risk factors ( age  -2, HTN-1, DM-1, Vasc disease -1, CHF-1) for a CHADSVASc Score of >=6       Records and Results Reviewed   Past Medical History:  Diagnosis Date   AICD (automatic cardioverter/defibrillator) present    Arthritis    Bladder cancer (HCC)    Borderline glaucoma    Chronic systolic (congestive) heart failure (HCC)    Coronary artery disease    Diabetes mellitus type 2, controlled (HCC) ORAL MED   Essential hypertension    Heart murmur    Hepatitis    Medical history non-contributory    Paroxysmal A-fib (HCC)    Paroxysmal A-fib (HCC) 08/20/2020   Peripheral vascular disease (HCC)    Pneumonia     Past Surgical History:  Procedure Laterality Date   CARDIOVERSION N/A 08/10/2021   Procedure: CARDIOVERSION;  Surgeon: Dolores Patty, MD;  Location: [facility];  Service: Cardiovascular;  Laterality: N/A;   CAROTID DUPLEX SCAN  07-26-2010   BILATERAL ICA  STENOSIS 1% - 39%   CHOLECYSTECTOMY N/A 08/22/2020   Procedure: LAPAROSCOPIC CHOLECYSTECTOMY;  Surgeon: Emelia Loron, MD;  Location: [facility] OR;  Service: General;  Laterality: N/A;   CORONARY ARTERY BYPASS GRAFT N/A 04/08/2017   Procedure: CORONARY ARTERY BYPASS GRAFTING (CABG) TIMES TWO USING LEFT INTERNAL MAMMARY ARTERY AND LEFT SAPHENOUS LEG VEIN HARVESTED ENDOSCOPICALLY.  LEG VEIN ALSO HARVESTED FROM THE RIGHT LEG;  Surgeon: Alleen Borne, MD;  Location: [facility] OR;  Service: Open Heart Surgery;  Laterality: N/A;   CYSTOSCOPY WITH BIOPSY N/A 08/01/2012   Procedure: CYSTOSCOPY WITH BIOPSY BLADDER BIOPSY   ;  Surgeon: Antony Haste, MD;  Location: [facility];  Service: Urology;  Laterality: N/A;   ERCP N/A 08/21/2020   Procedure: ENDOSCOPIC RETROGRADE CHOLANGIOPANCREATOGRAPHY (ERCP);  Surgeon: Jeani Hawking, MD;  Location: [facility] ENDOSCOPY;  Service: Endoscopy;  Laterality: N/A;   FULGURATION OF BLADDER TUMOR N/A 08/01/2012   Procedure: FULGURATION OF BLADDER TUMOR;  Surgeon: Antony Haste, MD;  Location: [facility];  Service: Urology;  Laterality: N/A;    ICD IMPLANT  11/20/2017   ICD IMPLANT N/A 11/20/2017   Procedure: ICD IMPLANT;  Surgeon: Duke Salvia, MD;  Location: [facility] INVASIVE CV LAB;  Service: Cardiovascular;  Laterality: N/A;   LEFT HEART CATH AND CORONARY ANGIOGRAPHY N/A 03/31/2017   Procedure: LEFT HEART CATH AND CORONARY ANGIOGRAPHY;  Surgeon: Marykay Lex, MD;  Location: [facility] INVASIVE CV LAB;  Service: Cardiovascular;  Laterality: N/A;   LUMBAR LAMINECTOMY/DECOMPRESSION MICRODISCECTOMY Bilateral 06/16/2019   Procedure: Bilateral Lumbar Four-Five Laminectomy and Foraminotomy;  Surgeon: Julio Sicks, MD;  Location: [facility] OR;  Service: Neurosurgery;  Laterality: Bilateral;  posterior   REMOVAL OF STONES  08/21/2020   Procedure: REMOVAL OF STONES;  Surgeon: Jeani Hawking, MD;  Location: [facility] ENDOSCOPY;  Service: Endoscopy;;   RIGHT HEART CATH Right 03/31/2017   Procedure: RIGHT HEART CATH;  Surgeon: Marykay Lex, MD;  Location: [facility] INVASIVE CV LAB;  Service: Cardiovascular;  Laterality: Right;   SPHINCTEROTOMY  08/21/2020   Procedure: SPHINCTEROTOMY;  Surgeon: Jeani Hawking, MD;  Location: [facility] ENDOSCOPY;  Service: Endoscopy;;   TEE WITHOUT CARDIOVERSION N/A 04/08/2017   Procedure: TRANSESOPHAGEAL ECHOCARDIOGRAM (TEE);  Surgeon: Alleen Borne, MD;  Location: [facility] OR;  Service: Open Heart Surgery;  Laterality: N/A;   TRANSURETHRAL RESECTION OF BLADDER TUMOR  05/29/2011   Procedure: TRANSURETHRAL RESECTION OF BLADDER TUMOR (TURBT);  Surgeon: Antony Haste, MD;  Location: [facility];  Service: Urology;  Laterality: N/A;   VENTRICULAR ASSIST DEVICE INSERTION Right 03/31/2017   Procedure: VENTRICULAR ASSIST DEVICE INSERTION;  Surgeon: Marykay Lex, MD;  Location: [facility] INVASIVE CV LAB;  Service: Cardiovascular;  Laterality: Right;    Current Meds  Medication Sig   amiodarone (PACERONE) 200 MG tablet Take 1 tablet (200 mg total) by mouth 2 (two) times daily.   amLODipine (NORVASC) 5 MG tablet Take 1 tablet (5 mg total) by  mouth daily.   atorvastatin (LIPITOR) 80 MG tablet TAKE 1 TABLET BY MOUTH EVERY DAY   ELIQUIS 5 MG TABS tablet TAKE 1 TABLET TWICE A DAY   ENTRESTO 97-103 MG TAKE 1 TABLET TWICE A DAY   isosorbide-hydrALAZINE (BIDIL) 20-37.5 MG tablet Take 0.5 tablets by mouth 3 (three) times daily.   metFORMIN (GLUCOPHAGE-XR) 500 MG 24 hr tablet Take 1 tablet (500 mg total) by mouth daily with breakfast.   torsemide (DEMADEX) 20 MG tablet Take 1 tablet (20 mg total) by mouth 2 (two) times daily.    Allergies  Allergen Reactions   Spironolactone Other (See Comments)    Painful gynecomastia   Niacin And Related Hives      Review of Systems negative except from HPI and PMH  Physical Exam BP (!) 104/58   Pulse (!) 107   Ht 6\' 2"  (1.88 m)   Wt 217 lb (98.4 kg)   SpO2 96%   BMI 27.86 kg/m  Well developed and well nourished in no acute distress HENT normal Neck supple with JVP-7-8 cm Clear Device pocket well healed; without hematoma or erythema.  There is no tethering  Rapid but regular rate and rhythm, no  gallop No murmur Abd-soft with active BS No Clubbing cyanosis  edema Skin-warm and dry A & Oriented  Grossly normal sensory and motor function  ECG aflutter atrial cycle length 280 ms 2: 1 conduction New since 5/24  Device function is normal. Programming changes   Atrial fibrillation onset approximately June 20 Concurrent with this is the onset of decreasing intrathoracic impedance See Paceart for details     Assessment and  Plan Ischemic cardiomyopathy status post CABG   Congestive heart failure-class II  Implantable defibrillator-single-chamber-Medtronic   Atrial fibrillation-persistent-recurrent   Laminated clot (still present MRI)  Sinus bradycardia  Patient has recurrent atrial flutter on amiodarone.  This is coincidental with a significant decrease in functional status as well as a flexion point in his OptiVol graph.  Will undertake DC cardioversion.  Thereafter, with  the issues of his lungs, the plan would be to undertake CPX to see what his limiting issue is i.e. the lungs and/or heart.  In the event that his heart, we will plan to proceed with CCM implantation.  The benefits and risks were reviewed including but not limited to death,  perforation, infection, lead dislodgement and device malfunction.  The patient understands agrees and is willing to proceed.'      Sherryl Manges

## 2022-11-06 NOTE — H&P (View-Only) (Signed)
    Patient Care Team: Wilson, Fred H, MD as PCP - General (Family Medicine) Bensimhon, Daniel R, MD as PCP - Advanced Heart Failure (Cardiology) Bensimhon, Daniel R, MD as PCP - Cardiology (Cardiology) Takeyla Million C, MD as PCP - Electrophysiology (Cardiology)   HPI  [name] [name] is a 77 y.o. male seen in followup for ICD implanted for primary prevention 7/19.  Has Ischemic cardiomyopathy having presented 12/18 with anterior MI and shock requiring impella support.  Further complicated by sepsis.  S/P CABG with postop afib    He was found to have a laminated LV thrombuS>>treated with apixaban.  08/07/2021 seen in ER for acute congestive failure found to be in atrial flutter (ECG reviewed and was atypical) and was cardioverted put on a diuretic and discharged.  He was seen by heart failure 08/10/2021 had redeveloped atrial flutter and was cardioverted again (ECG is not available) >>> on amiodarone--concerns with GA has taken Castle-AF ablation off the table  Considering for CCM; CHF consdiering also LVAD   The patient denies chest pain,, nocturnal dyspnea, orthopnea or peripheral edema.  There have been no palpitations, lightheadedness or syncope.  Complains of fatigue and dyspnea on exertion.  This has been particularly noteworthy over the last 3 weeks.  (See below)..  Cardiopulmonary testing.  Will schedule but had to be canceled because of the onset of atrial flutter amiodarone dose was increased  Seen in heart failure clinic this morning furosemide was changed to torsemide.    DATE TEST EF    12/18 LHC  25 % RCA T; LAD 95; Cx 70%  4/19 Echo  25-30% Mod-sev MR  6/19 cMRI 30%    8/20 Echo  25-30%   7/22 Echo  20-25%   9/23 Echo  20-25% MR mod         Date Cr K Hgb TSH LFTs  7/19 1.18 4.7 12.5    8/20  1.41 4.9<<5.3 15.7    10/21 1.41 4.9 16.1    5/23 1.88 5.0 16.7    Thromboembolic risk factors ( age  -2, HTN-1, DM-1, Vasc disease -1, CHF-1) for a CHADSVASc Score of >=6       Records and Results Reviewed   Past Medical History:  Diagnosis Date   AICD (automatic cardioverter/defibrillator) present    Arthritis    Bladder cancer (HCC)    Borderline glaucoma    Chronic systolic (congestive) heart failure (HCC)    Coronary artery disease    Diabetes mellitus type 2, controlled (HCC) ORAL MED   Essential hypertension    Heart murmur    Hepatitis    Medical history non-contributory    Paroxysmal A-fib (HCC)    Paroxysmal A-fib (HCC) 08/20/2020   Peripheral vascular disease (HCC)    Pneumonia     Past Surgical History:  Procedure Laterality Date   CARDIOVERSION N/A 08/10/2021   Procedure: CARDIOVERSION;  Surgeon: Bensimhon, Daniel R, MD;  Location: [facility];  Service: Cardiovascular;  Laterality: N/A;   CAROTID DUPLEX SCAN  07-26-2010   BILATERAL ICA  STENOSIS 1% - 39%   CHOLECYSTECTOMY N/A 08/22/2020   Procedure: LAPAROSCOPIC CHOLECYSTECTOMY;  Surgeon: Wakefield, Matthew, MD;  Location: [facility] OR;  Service: General;  Laterality: N/A;   CORONARY ARTERY BYPASS GRAFT N/A 04/08/2017   Procedure: CORONARY ARTERY BYPASS GRAFTING (CABG) TIMES TWO USING LEFT INTERNAL MAMMARY ARTERY AND LEFT SAPHENOUS LEG VEIN HARVESTED ENDOSCOPICALLY.  LEG VEIN ALSO HARVESTED FROM THE RIGHT LEG;  Surgeon: Bartle, Bryan K, MD;    Location: [facility] OR;  Service: Open Heart Surgery;  Laterality: N/A;   CYSTOSCOPY WITH BIOPSY N/A 08/01/2012   Procedure: CYSTOSCOPY WITH BIOPSY BLADDER BIOPSY   ;  Surgeon: Matthew Ramsey Eskridge, MD;  Location: [facility];  Service: Urology;  Laterality: N/A;   ERCP N/A 08/21/2020   Procedure: ENDOSCOPIC RETROGRADE CHOLANGIOPANCREATOGRAPHY (ERCP);  Surgeon: Hung, Patrick, MD;  Location: [facility];  Service: Endoscopy;  Laterality: N/A;   FULGURATION OF BLADDER TUMOR N/A 08/01/2012   Procedure: FULGURATION OF BLADDER TUMOR;  Surgeon: Matthew Ramsey Eskridge, MD;  Location: [facility];  Service: Urology;  Laterality: N/A;    ICD IMPLANT  11/20/2017   ICD IMPLANT N/A 11/20/2017   Procedure: ICD IMPLANT;  Surgeon: Callie Bunyard C, MD;  [facility] INVASIVE CV LAB;  Service: Cardiovascular;  Laterality: N/A;   LEFT HEART CATH AND CORONARY ANGIOGRAPHY N/A 03/31/2017   Procedure: LEFT HEART CATH AND CORONARY ANGIOGRAPHY;  Surgeon: Harding, David W, MD;  [facility] INVASIVE CV LAB;  Service: Cardiovascular;  Laterality: N/A;   LUMBAR LAMINECTOMY/DECOMPRESSION MICRODISCECTOMY Bilateral 06/16/2019   Procedure: Bilateral Lumbar Four-Five Laminectomy and Foraminotomy;  Surgeon: Pool, Henry, MD;  Location: [facility] OR;  Service: Neurosurgery;  Laterality: Bilateral;  posterior   REMOVAL OF STONES  08/21/2020   Procedure: REMOVAL OF STONES;  Surgeon: Hung, Patrick, MD;  Location: [facility]OS[facility];  Service: Endoscopy;;   RIGHT HEART CATH Right 03/31/2017   Procedure: RIGHT HEART CATH;  Surgeon: Harding, David W, MD;  [facility] INVAS[facility] CV LAB;  Service: Cardiovascular;  Laterality: Right;   SPHINCTEROTOMY  08/21/2020   Procedure: SPHINCTEROTOMY;  Surgeon: Hung, Patrick, MD;  [facility]: [facility];  Service: Endoscopy;;   TEE WITHOUT CARDIOVERSION N/A 04/08/2017   Procedure: TRANSESOPHAGEAL ECHOCARDIOGRAM (TEE);  Surgeon: Bartle, Bryan K, MD;  Location: [facility] OR;  Service: Open Heart Surgery;  Laterality: N/A;   TRANSURETHRAL RESECTION OF BLADDER TUMOR  05/29/2011   Procedure: TRANSURETHRAL RESECTION OF BLADDER TUMOR (TURBT);  Surgeon: Matthew Ramsey Eskridge, MD;  Location:  SURGERY CENTER;  Service: Urology;  Laterality: N/A;   VENTRICULAR ASSIST DEVICE INSERTION Right 03/31/2017   Procedure: VENTRICULAR ASSIST DEVICE INSERTION;  Surgeon: Harding, David W, MD;  [facility] CV LAB;  Service: Cardiovascular;  Laterality: Right;    Current Meds  Medication Sig   amiodarone (PACERONE) 200 MG tablet Take 1 tablet (200 mg total) by mouth 2 (two) times daily.   amLODipine (NORVASC) 5 MG tablet Take 1 tablet (5 mg total) by  mouth daily.   atorvastatin (LIPITOR) 80 MG tablet TAKE 1 TABLET BY MOUTH EVERY DAY   ELIQUIS 5 MG TABS tablet TAKE 1 TABLET TWICE A DAY   ENTRESTO 97-103 MG TAKE 1 TABLET TWICE A DAY   isosorbide-hydrALAZINE (BIDIL) 20-37.5 MG tablet Take 0.5 tablets by mouth 3 (three) times daily.   metFORMIN (GLUCOPHAGE-XR) 500 MG 24 hr tablet Take 1 tablet (500 mg total) by mouth daily with breakfast.   torsemide (DEMADEX) 20 MG tablet Take 1 tablet (20 mg total) by mouth 2 (two) times daily.    Allergies  Allergen Reactions   Spironolactone Other (See Comments)    Painful gynecomastia   Niacin And Related Hives      Review of Systems negative except from HPI and PMH  Physical Exam BP (!) 104/58   Pulse (!) 107   Ht 6' 2" (1.88 m)   Wt 217 lb (98.4 kg)   SpO2 96%   BMI 27.86 kg/m    Well developed and well nourished in no acute distress HENT normal Neck supple with JVP-7-8 cm Clear Device pocket well healed; without hematoma or erythema.  There is no tethering  Rapid but regular rate and rhythm, no  gallop No murmur Abd-soft with active BS No Clubbing cyanosis  edema Skin-warm and dry A & Oriented  Grossly normal sensory and motor function  ECG aflutter atrial cycle length 280 ms 2: 1 conduction New since 5/24  Device function is normal. Programming changes   Atrial fibrillation onset approximately June 20 Concurrent with this is the onset of decreasing intrathoracic impedance See Paceart for details     Assessment and  Plan Ischemic cardiomyopathy status post CABG   Congestive heart failure-class II  Implantable defibrillator-single-chamber-Medtronic   Atrial fibrillation-persistent-recurrent   Laminated clot (still present MRI)  Sinus bradycardia  Patient has recurrent atrial flutter on amiodarone.  This is coincidental with a significant decrease in functional status as well as a flexion point in his OptiVol graph.  Will undertake DC cardioversion.  Thereafter, with  the issues of his lungs, the plan would be to undertake CPX to see what his limiting issue is i.e. the lungs and/or heart.  In the event that his heart, we will plan to proceed with CCM implantation.  The benefits and risks were reviewed including but not limited to death,  perforation, infection, lead dislodgement and device malfunction.  The patient understands agrees and is willing to proceed.'      Breda Bond 

## 2022-11-06 NOTE — Telephone Encounter (Signed)
Lvm / scheduled CPX 7/30

## 2022-11-07 LAB — CBC
Hematocrit: 40.9 % (ref 37.5–51.0)
Hemoglobin: 13.2 g/dL (ref 13.0–17.7)
MCH: 29.5 pg (ref 26.6–33.0)
MCHC: 32.3 g/dL (ref 31.5–35.7)
MCV: 91 fL (ref 79–97)
Platelets: 288 10*3/uL (ref 150–450)
RBC: 4.48 x10E6/uL (ref 4.14–5.80)
RDW: 14.5 % (ref 11.6–15.4)
WBC: 8.7 10*3/uL (ref 3.4–10.8)

## 2022-11-07 LAB — BASIC METABOLIC PANEL
BUN/Creatinine Ratio: 13 (ref 10–24)
BUN: 30 mg/dL — ABNORMAL HIGH (ref 8–27)
CO2: 22 mmol/L (ref 20–29)
Calcium: 9.6 mg/dL (ref 8.6–10.2)
Chloride: 103 mmol/L (ref 96–106)
Creatinine, Ser: 2.27 mg/dL — ABNORMAL HIGH (ref 0.76–1.27)
Glucose: 160 mg/dL — ABNORMAL HIGH (ref 70–99)
Potassium: 5.5 mmol/L — ABNORMAL HIGH (ref 3.5–5.2)
Sodium: 139 mmol/L (ref 134–144)
eGFR: 29 mL/min/{1.73_m2} — ABNORMAL LOW (ref >59)

## 2022-11-08 NOTE — Progress Notes (Signed)
Spoke to pt and instructed them to come at 0900 and to be NPO after 0000. Confirmed no missed doses of AC and instructed to take in AM with a small sip of water.   Confirmed that pt will have a ride home and someone to stay with them for 24 hours after the procedure.

## 2022-11-09 ENCOUNTER — Ambulatory Visit (HOSPITAL_BASED_OUTPATIENT_CLINIC_OR_DEPARTMENT_OTHER): Payer: Medicare Other | Admitting: Anesthesiology

## 2022-11-09 ENCOUNTER — Encounter (HOSPITAL_COMMUNITY)
Admission: RE | Disposition: A | Discharge status: Home / Self Care | Payer: Self-pay | Source: Home / Self Care | Attending: Internal Medicine

## 2022-11-09 ENCOUNTER — Encounter (HOSPITAL_COMMUNITY): Payer: Self-pay | Admitting: Internal Medicine

## 2022-11-09 ENCOUNTER — Ambulatory Visit (HOSPITAL_COMMUNITY)
Admission: RE | Admit: 2022-11-09 | Discharge: 2022-11-09 | Disposition: A | Discharge status: Home / Self Care | Payer: Medicare Other | Attending: Internal Medicine | Admitting: Internal Medicine

## 2022-11-09 ENCOUNTER — Ambulatory Visit (HOSPITAL_COMMUNITY): Payer: Medicare Other | Admitting: Anesthesiology

## 2022-11-09 DIAGNOSIS — I130 Hypertensive heart and chronic kidney disease with heart failure and stage 1 through stage 4 chronic kidney disease, or unspecified chronic kidney disease: Secondary | ICD-10-CM

## 2022-11-09 DIAGNOSIS — N1832 Chronic kidney disease, stage 3b: Secondary | ICD-10-CM

## 2022-11-09 DIAGNOSIS — I4892 Unspecified atrial flutter: Secondary | ICD-10-CM

## 2022-11-09 DIAGNOSIS — Z7984 Long term (current) use of oral hypoglycemic drugs: Secondary | ICD-10-CM | POA: Insufficient documentation

## 2022-11-09 DIAGNOSIS — I255 Ischemic cardiomyopathy: Secondary | ICD-10-CM | POA: Insufficient documentation

## 2022-11-09 DIAGNOSIS — Z7901 Long term (current) use of anticoagulants: Secondary | ICD-10-CM | POA: Diagnosis not present

## 2022-11-09 DIAGNOSIS — I5043 Acute on chronic combined systolic (congestive) and diastolic (congestive) heart failure: Secondary | ICD-10-CM

## 2022-11-09 DIAGNOSIS — E1151 Type 2 diabetes mellitus with diabetic peripheral angiopathy without gangrene: Secondary | ICD-10-CM | POA: Insufficient documentation

## 2022-11-09 DIAGNOSIS — R001 Bradycardia, unspecified: Secondary | ICD-10-CM | POA: Diagnosis not present

## 2022-11-09 DIAGNOSIS — I5022 Chronic systolic (congestive) heart failure: Secondary | ICD-10-CM | POA: Insufficient documentation

## 2022-11-09 DIAGNOSIS — I110 Hypertensive heart disease with heart failure: Secondary | ICD-10-CM | POA: Diagnosis not present

## 2022-11-09 DIAGNOSIS — I2510 Atherosclerotic heart disease of native coronary artery without angina pectoris: Secondary | ICD-10-CM | POA: Insufficient documentation

## 2022-11-09 DIAGNOSIS — Z79899 Other long term (current) drug therapy: Secondary | ICD-10-CM | POA: Diagnosis not present

## 2022-11-09 DIAGNOSIS — I4819 Other persistent atrial fibrillation: Secondary | ICD-10-CM | POA: Insufficient documentation

## 2022-11-09 DIAGNOSIS — Z951 Presence of aortocoronary bypass graft: Secondary | ICD-10-CM | POA: Diagnosis not present

## 2022-11-09 DIAGNOSIS — Z95810 Presence of automatic (implantable) cardiac defibrillator: Secondary | ICD-10-CM | POA: Diagnosis not present

## 2022-11-09 DIAGNOSIS — I483 Typical atrial flutter: Secondary | ICD-10-CM

## 2022-11-09 HISTORY — PX: CARDIOVERSION: SHX1299

## 2022-11-09 SURGERY — CARDIOVERSION
Anesthesia: General

## 2022-11-09 MED ORDER — LIDOCAINE 2% (20 MG/ML) 5 ML SYRINGE
INTRAMUSCULAR | Status: DC | PRN
  Administered 2022-11-09: 60 mg via INTRAVENOUS

## 2022-11-09 MED ORDER — SODIUM CHLORIDE 0.9 % IV SOLN: INTRAVENOUS | Status: DC | PRN

## 2022-11-09 MED ORDER — SODIUM CHLORIDE 0.9 % IV SOLN: INTRAVENOUS | Status: DC

## 2022-11-09 MED ORDER — PROPOFOL 10 MG/ML IV BOLUS
INTRAVENOUS | Status: DC | PRN
  Administered 2022-11-09: 40 mg via INTRAVENOUS
  Administered 2022-11-09: 20 mg via INTRAVENOUS

## 2022-11-09 SURGICAL SUPPLY — 1 items: ELECT DEFIB PAD ADLT CADENCE (PAD) ×1 IMPLANT

## 2022-11-09 NOTE — Discharge Instructions (Signed)

## 2022-11-09 NOTE — Anesthesia Procedure Notes (Signed)
Procedure Name: MAC Date/Time: 11/09/2022 9:20 AM  Performed by: Waynard Edwards, CRNAPre-anesthesia Checklist: Patient identified, Emergency Drugs available, Suction available and Patient being monitored Patient Re-evaluated:Patient Re-evaluated prior to induction Oxygen Delivery Method: Nasal cannula Induction Type: IV induction Placement Confirmation: positive ETCO2 Dental Injury: Teeth and Oropharynx as per pre-operative assessment

## 2022-11-09 NOTE — Transfer of Care (Signed)
Immediate Anesthesia Transfer of Care Note  Patient: [name] [name]  Procedure(s) Performed: CARDIOVERSION  Patient Location: Cath Lab  Anesthesia Type:MAC  Level of Consciousness: drowsy  Airway & Oxygen Therapy: Patient Spontanous Breathing and Patient connected to nasal cannula oxygen  Post-op Assessment: Report given to RN and Post -op Vital signs reviewed and stable  Post vital signs: Reviewed and stable  Last Vitals:  Vitals Value Taken Time  BP 125/85 11/09/22 0915  Temp    Pulse 105 11/09/22 0914  Resp 20 11/09/22 0914  SpO2 98 % 11/09/22 0914  Vitals shown include unfiled device data.  Last Pain:  Vitals:   11/09/22 0905  TempSrc: Temporal         Complications: No notable events documented.

## 2022-11-09 NOTE — CV Procedure (Signed)
Procedure: Electrical Cardioversion Indications:  Atrial Flutter  Procedure Details:  Consent: Risks of procedure as well as the alternatives and risks of each were explained to the (patient/caregiver).  Consent for procedure obtained.  Time Out: Verified patient identification, verified procedure, site/side was marked, verified correct patient position, special equipment/implants available, medications/allergies/relevent history reviewed, required imaging and test results available. PERFORMED.  Patient placed on cardiac monitor, pulse oximetry, supplemental oxygen as necessary.  Sedation given:  propofol Pacer pads placed anterior and posterior chest.  Cardioverted 1 time(s).  Cardioversion with synchronized biphasic 120J shock.  Evaluation: Findings: Post procedure EKG shows: NSR Complications: None Patient did tolerate procedure well.  Time Spent Directly with the Patient:  15 minutes   [name] [name] 11/09/2022, 9:27 AM

## 2022-11-09 NOTE — Interval H&P Note (Signed)
History and Physical Interval Note:  11/09/2022 9:25 AM  [name] [name]  has presented today for surgery, with the diagnosis of AFIB.  The various methods of treatment have been discussed with the patient and family. After consideration of risks, benefits and other options for treatment, the patient has consented to  Procedure(s): CARDIOVERSION (N/A) as a surgical intervention.  The patient's history has been reviewed, patient examined, no change in status, stable for surgery.  I have reviewed the patient's chart and labs.  Questions were answered to the patient's satisfaction.     Maisie Fus

## 2022-11-09 NOTE — Anesthesia Preprocedure Evaluation (Addendum)
Anesthesia Evaluation  Patient identified by MRN, date of birth, ID band Patient awake    Reviewed: Allergy & Precautions, NPO status , Patient's Chart, lab work & pertinent test results  History of Anesthesia Complications Negative for: history of anesthetic complications  Airway Mallampati: II  TM Distance: >3 FB Neck ROM: Full    Dental  (+) Dental Advisory Given   Pulmonary former smoker   Pulmonary exam normal        Cardiovascular hypertension, Pt. on medications + CAD, + Past MI, + Peripheral Vascular Disease and +CHF  + dysrhythmias Atrial Fibrillation + Cardiac Defibrillator  Rhythm:Irregular Rate:Tachycardia   '24 TTE - EF 25 to 30%. The left ventricle demonstrates global hypokinesis. The left ventricular internal cavity size was mildly dilated. Grade II diastolic dysfunction (pseudonormalization). Right ventricular systolic function is mildly reduced. There is mildly elevated pulmonary artery systolic pressure. Left atrial size was mildly dilated. Trivial mitral valve regurgitation. Left and right coronary cusps are partially fused. Aortic valve regurgitation is trivial.     Neuro/Psych negative neurological ROS  negative psych ROS   GI/Hepatic negative GI ROS, Neg liver ROS,,,  Endo/Other  diabetes, Type 2, Oral Hypoglycemic Agents    Renal/GU negative Renal ROS    Bladder cancer     Musculoskeletal  (+) Arthritis ,    Abdominal   Peds  Hematology  On eliquis    Anesthesia Other Findings   Reproductive/Obstetrics                             Anesthesia Physical Anesthesia Plan  ASA: 4  Anesthesia Plan: General   Post-op Pain Management:    Induction: Intravenous  PONV Risk Score and Plan: 2 and Treatment may vary due to age or medical condition and Propofol infusion  Airway Management Planned: Natural Airway and Mask  Additional Equipment: None  Intra-op Plan:    Post-operative Plan:   Informed Consent: I have reviewed the patients History and Physical, chart, labs and discussed the procedure including the risks, benefits and alternatives for the proposed anesthesia with the patient or authorized representative who has indicated his/her understanding and acceptance.       Plan Discussed with: CRNA and Anesthesiologist  Anesthesia Plan Comments:         Anesthesia Quick Evaluation

## 2022-11-09 NOTE — Anesthesia Postprocedure Evaluation (Signed)
Anesthesia Post Note  Patient: [name] [name]  Procedure(s) Performed: CARDIOVERSION     Patient location during evaluation: PACU Anesthesia Type: General Level of consciousness: awake and alert Pain management: pain level controlled Vital Signs Assessment: post-procedure vital signs reviewed and stable Respiratory status: spontaneous breathing, nonlabored ventilation and respiratory function stable Cardiovascular status: stable and blood pressure returned to baseline Anesthetic complications: no   No notable events documented.  Last Vitals:  Vitals:   11/09/22 0931 11/09/22 0940  BP: 106/65 107/65  Pulse: 63   Resp: 17   Temp: 36.7 C   SpO2: 98%     Last Pain:  Vitals:   11/09/22 0931  TempSrc: Temporal  PainSc: 0-No pain                 Beryle Lathe

## 2022-11-12 ENCOUNTER — Encounter: Payer: Self-pay | Admitting: Internal Medicine

## 2022-11-12 ENCOUNTER — Encounter (HOSPITAL_COMMUNITY): Payer: Self-pay | Admitting: Internal Medicine

## 2022-11-13 ENCOUNTER — Ambulatory Visit (HOSPITAL_COMMUNITY)
Admission: RE | Admit: 2022-11-13 | Discharge: 2022-11-13 | Disposition: A | Discharge status: Home / Self Care | Payer: Medicare Other | Source: Ambulatory Visit | Attending: Physician Assistant | Admitting: Physician Assistant

## 2022-11-13 ENCOUNTER — Other Ambulatory Visit (HOSPITAL_COMMUNITY): Payer: Self-pay | Admitting: Cardiology

## 2022-11-13 ENCOUNTER — Encounter (HOSPITAL_COMMUNITY): Payer: Self-pay

## 2022-11-13 VITALS — BP 140/62 | HR 60 | Wt 209.0 lb

## 2022-11-13 DIAGNOSIS — I739 Peripheral vascular disease, unspecified: Secondary | ICD-10-CM

## 2022-11-13 DIAGNOSIS — I10 Essential (primary) hypertension: Secondary | ICD-10-CM

## 2022-11-13 DIAGNOSIS — N62 Hypertrophy of breast: Secondary | ICD-10-CM | POA: Insufficient documentation

## 2022-11-13 DIAGNOSIS — I340 Nonrheumatic mitral (valve) insufficiency: Secondary | ICD-10-CM | POA: Insufficient documentation

## 2022-11-13 DIAGNOSIS — M79651 Pain in right thigh: Secondary | ICD-10-CM | POA: Insufficient documentation

## 2022-11-13 DIAGNOSIS — M79661 Pain in right lower leg: Secondary | ICD-10-CM | POA: Insufficient documentation

## 2022-11-13 DIAGNOSIS — Z951 Presence of aortocoronary bypass graft: Secondary | ICD-10-CM | POA: Insufficient documentation

## 2022-11-13 DIAGNOSIS — M79652 Pain in left thigh: Secondary | ICD-10-CM | POA: Diagnosis not present

## 2022-11-13 DIAGNOSIS — Z87891 Personal history of nicotine dependence: Secondary | ICD-10-CM | POA: Insufficient documentation

## 2022-11-13 DIAGNOSIS — E1151 Type 2 diabetes mellitus with diabetic peripheral angiopathy without gangrene: Secondary | ICD-10-CM | POA: Diagnosis not present

## 2022-11-13 DIAGNOSIS — I480 Paroxysmal atrial fibrillation: Secondary | ICD-10-CM | POA: Insufficient documentation

## 2022-11-13 DIAGNOSIS — N1832 Chronic kidney disease, stage 3b: Secondary | ICD-10-CM | POA: Insufficient documentation

## 2022-11-13 DIAGNOSIS — Z7901 Long term (current) use of anticoagulants: Secondary | ICD-10-CM | POA: Insufficient documentation

## 2022-11-13 DIAGNOSIS — I4892 Unspecified atrial flutter: Secondary | ICD-10-CM | POA: Diagnosis not present

## 2022-11-13 DIAGNOSIS — R262 Difficulty in walking, not elsewhere classified: Secondary | ICD-10-CM | POA: Insufficient documentation

## 2022-11-13 DIAGNOSIS — M79662 Pain in left lower leg: Secondary | ICD-10-CM | POA: Diagnosis not present

## 2022-11-13 DIAGNOSIS — I5022 Chronic systolic (congestive) heart failure: Secondary | ICD-10-CM | POA: Insufficient documentation

## 2022-11-13 DIAGNOSIS — I2510 Atherosclerotic heart disease of native coronary artery without angina pectoris: Secondary | ICD-10-CM | POA: Insufficient documentation

## 2022-11-13 DIAGNOSIS — I130 Hypertensive heart and chronic kidney disease with heart failure and stage 1 through stage 4 chronic kidney disease, or unspecified chronic kidney disease: Secondary | ICD-10-CM | POA: Insufficient documentation

## 2022-11-13 DIAGNOSIS — E1122 Type 2 diabetes mellitus with diabetic chronic kidney disease: Secondary | ICD-10-CM | POA: Diagnosis not present

## 2022-11-13 DIAGNOSIS — Z79899 Other long term (current) drug therapy: Secondary | ICD-10-CM | POA: Diagnosis not present

## 2022-11-13 DIAGNOSIS — Z8551 Personal history of malignant neoplasm of bladder: Secondary | ICD-10-CM | POA: Diagnosis not present

## 2022-11-13 DIAGNOSIS — I513 Intracardiac thrombosis, not elsewhere classified: Secondary | ICD-10-CM | POA: Diagnosis not present

## 2022-11-13 DIAGNOSIS — J449 Chronic obstructive pulmonary disease, unspecified: Secondary | ICD-10-CM | POA: Diagnosis not present

## 2022-11-13 LAB — BASIC METABOLIC PANEL
Anion gap: 12 (ref 5–15)
BUN: 39 mg/dL — ABNORMAL HIGH (ref 8–23)
CO2: 25 mmol/L (ref 22–32)
Calcium: 9.4 mg/dL (ref 8.9–10.3)
Chloride: 101 mmol/L (ref 98–111)
Creatinine, Ser: 2.6 mg/dL — ABNORMAL HIGH (ref 0.61–1.24)
GFR, Estimated: 25 mL/min — ABNORMAL LOW (ref >60)
Glucose, Bld: 163 mg/dL — ABNORMAL HIGH (ref 70–99)
Potassium: 4.6 mmol/L (ref 3.5–5.1)
Sodium: 138 mmol/L (ref 135–145)

## 2022-11-13 LAB — BRAIN NATRIURETIC PEPTIDE: B Natriuretic Peptide: 376.8 pg/mL — ABNORMAL HIGH (ref 0.0–100.0)

## 2022-11-13 MED ORDER — AMIODARONE HCL 200 MG PO TABS: 200.0000 mg | ORAL_TABLET | Freq: Two times a day (BID) | ORAL | 3 refills | Status: DC

## 2022-11-13 NOTE — Addendum Note (Signed)
Encounter addended by: Andrey Farmer, PA-C on: 11/13/2022 4:59 PM  Actions taken: Clinical Note Signed

## 2022-11-13 NOTE — Patient Instructions (Signed)
Labs done today. We will contact you only if your labs are abnormal.  Your amiodarone has been refilled.   No other medication changes were made. Please continue all current medications as prescribed.  Your physician recommends that you schedule a follow-up appointment in: 4 weeks with our NP/PA Clinic here in our office.   If you have any questions or concerns before your next appointment please send [place] a message through [employer] or call our office at [phone].    TO LEAVE A MESSAGE FOR THE NURSE SELECT OPTION 2, PLEASE LEAVE A MESSAGE INCLUDING: YOUR NAME DATE OF BIRTH CALL BACK NUMBER REASON FOR CALL**this is important as we prioritize the call backs  YOU WILL RECEIVE A CALL BACK THE SAME DAY AS LONG AS YOU CALL BEFORE 4:00 PM   Do the following things EVERYDAY: Weigh yourself in the morning before breakfast. Write it down and keep it in a log. Take your medicines as prescribed Eat low salt foods--Limit salt (sodium) to 2000 mg per day.  Stay as active as you can everyday Limit all fluids for the day to less than 2 liters   At the [facility], you and your health needs are our priority. As part of our continuing mission to provide you with exceptional heart care, we have created designated Provider Care Teams. These Care Teams include your primary Cardiologist (physician) and Advanced Practice Providers (APPs- Physician Assistants and Nurse Practitioners) who all work together to provide you with the care you need, when you need it.   You may see any of the following providers on your designated Care Team at your next follow up: Dr Arvilla Meres Dr Marca Ancona Dr. Marcos Eke, NP Robbie Lis, [place] [facility] [place], [place] Brynda Peon, NP Karle Plumber, PharmD   Please be sure to bring in all your medications bottles to every appointment.    Thank you for choosing [facility]

## 2022-11-13 NOTE — Progress Notes (Addendum)
[facility] Note   PCP: [name], MD HF Cardiologist: Dr. Gala Romney   HPI: [name] [name] is a 77 y.o. 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   Pt admitted in 12/18 with anterior STEMI and developed cardiogenic shock requiring Impella support. Cath showed severe 3v CAD. While on impella, pt developed sepsis with WBC ~ 40K, UCx + pseudomonas. Stabilized and underwent CABG x 2 with LIMA to LAD and SVG to OM on 04/08/2017. Pt transiently required milrinone but weaned prior to discharge. Required amiodarone for brief post op afib, but had no recurrence.   Echo 08/07/17 EF 35-40% laminated apical clot. (Dr.Bensimhon felt ~ 25-30%) and moderate to severe MR.   cMRI 6/19 EF 30%, laminated apical thrombus, normal RV size and function, LGE pattern suggests infarction in LAD territory, unlikely to improve with revascularization  S/p Medtronic ICD with Dr Graciela Husbands 11/20/17.  Saw Dr. Graciela Husbands on 01/07/20 and was in AF with RVR. Potassium also high so Inspra stopped. Dr. Odessa Fleming note also mentions chronotropic incompetence with exercise. Carvedilol cut back to 3.125 bid.   Echo 07/22 EF 20-25% , RV mildly reduced, no LV clot.  Admitted 4/23 with AFL with RVR. Underwent DCCV to NSR. Presented back to ED next day with a/c CHF. Post hospital follow up, he was volume overloaded and back in AFL RVR. Amiodarone started and arranged for DCCV.  S/p DCCV 08/10/21 to NSR.  Echo 9/23 EF 20-25%, RV mildly reduced, moderate MR, mild to moderate AI  Echo 05/24: EF 25-30%, RV mildly reduced  Has recently been struggling with recurrent AFL and volume overload.  He underwent DCCV to SR on 07/12.   CPX recently cancelled d/t recurrent atrial arrhythmias. Plan is to consider CCM vs possible workup for VAD pending results of  CPX scheduled 07/30. CCM implant currently scheduled for September.   Last seen in HF clinic 07/09. He was volume overloaded. Furosemide switched to 20 mg Torsemide daily.  He is here today for close follow-up.   He is feeling much better this week. Home weight down 6 lb. Dyspnea and abdominal bloating improved. No longer having orthopnea. [facility] Notes occasional dizziness with activity. Activity mostly limited by pain that starts in his thighs and moves to his calves after walking about 200 feet.  Cardiac Studies:  - Echo (9/23): EF 20-25%, RV mildly reduced, moderate MR, mild to moderate AI  - Echo (11/16/20): EF 20-25% RV mildly reduced No LV clot   - Echo (8/20): EF 25-30% RV ok. Small apical thrombus.  - L/RHC 03/31/2017 Prox RCA lesion is 100% stenosed. Dist RCA lesion is 100% stenosed (retrograde filling from collaterals does not go beyond the distal vessel) -->faint collateral filling to the distal PL and PDA from septal perforators and a diffusely diseased AV groove circumflex LAV Groove lesion is 90% stenosed. Prox LAD-1 lesion is 95% stenosed. Prox LAD-2 lesion is 85% stenosed. Heavily calcified, tandem lesions in very tortuous segment of the vessel. Prox Cx to Mid Cx lesion is 70% stenosed. Heavily calcified There is severe left ventricular systolic dysfunction. The left ventricular ejection fraction is less than 25% by visual estimate. LV end diastolic pressure is severely elevated -prior to Impella insertion Hemodynamic findings consistent with moderate pulmonary hypertension.  Successful Impella insertion with 3.5-3.6 LPM flow's  - Echo 04/01/2017 EF 15-20%   - Echo 04/16/17 (post op) Improved to 25-30% on 04/16/17  - Echo 08/07/17: EF 35-40%, mod to severe MR  Review of systems complete and found to be negative unless listed in HPI.   Past Medical History:  Diagnosis Date   AICD (automatic cardioverter/defibrillator) present     Arthritis    Bladder cancer (HCC)    Borderline glaucoma    Chronic systolic (congestive) heart failure (HCC)    Coronary artery disease    Diabetes mellitus type 2, controlled (HCC) [facility]   Essential hypertension    Heart murmur    Hepatitis    Medical history non-contributory    Paroxysmal A-fib (HCC)    Paroxysmal A-fib (HCC) 08/20/2020   Peripheral vascular disease (HCC)    Pneumonia    Current Outpatient Medications  Medication Sig Dispense Refill   amLODipine (NORVASC) 5 MG tablet Take 1 tablet (5 mg total) by mouth daily. 90 tablet 3   atorvastatin (LIPITOR) 80 MG tablet TAKE 1 TABLET BY MOUTH EVERY DAY 90 tablet 3   ELIQUIS 5 MG TABS tablet TAKE 1 TABLET TWICE A DAY 180 tablet 3   ENTRESTO 97-103 MG TAKE 1 TABLET TWICE A DAY 180 tablet 3   isosorbide-hydrALAZINE (BIDIL) 20-37.5 MG tablet Take 0.5 tablets by mouth 3 (three) times daily. 100 tablet 3   metFORMIN (GLUCOPHAGE-XR) 500 MG 24 hr tablet Take 1 tablet (500 mg total) by mouth daily with breakfast.     oxymetazoline (DRISTAN) 0.05 % nasal spray Place 1 spray into both nostrils at bedtime as needed for congestion.     amiodarone (PACERONE) 200 MG tablet Take 1 tablet (200 mg total) by mouth 2 (two) times daily. 180 tablet 3   torsemide (DEMADEX) 20 MG tablet TAKE 1 TABLET BY MOUTH TWICE A DAY 180 tablet 3   No current facility-administered medications for this encounter.   Allergies  Allergen Reactions   Spironolactone Other (See Comments)    Painful gynecomastia   Niacin And Related Hives   Social History   Socioeconomic History   Marital status: Married    Spouse name: Not on file   Number of children: Not on file   Years of education: Not on file   Highest education level: Not on file  Occupational History   Occupation: retired  Tobacco Use   Smoking status: Former    Current packs/day: 0.00    Average packs/day: 1 pack/day for 40.0 years (40.0 ttl pk-yrs)    Types: Cigarettes    Start date:  03/31/1977    Quit date: 03/31/2017    Years since quitting: 5.6   Smokeless tobacco: Never  Vaping Use   Vaping status: Never Used  Substance and Sexual Activity   Alcohol use: Not Currently    Comment: rarely   Drug use: No   Sexual activity: Not on file  Other Topics Concern   Not on file  Social History Narrative   Not on file   Social Determinants of Health   Financial Resource Strain: Not on file  Food Insecurity: No Food Insecurity (01/04/2022)   Hunger Vital Sign    Worried About Running Out of Food in the Last Year: Never true    Ran Out of Food in the Last Year: Never true  Transportation Needs: No Transportation Needs (01/04/2022)   PRAPARE - Administrator, Civil Service (Medical): No  Lack of Transportation (Non-Medical): No  Physical Activity: Inactive (07/11/2017)   Exercise Vital Sign    Days of Exercise per Week: 0 days    Minutes of Exercise per Session: 0 min  Stress: No Stress Concern Present (07/11/2017)   [employer] of Occupational Health - Occupational Stress Questionnaire    Feeling of Stress : Only a little  Social Connections: Not on file  Intimate Partner Violence: Not on file   Family History  Problem Relation Age of Onset   Heart attack Sister    Heart disease Sister    BP (!) 140/62   Pulse 60   Wt 94.8 kg (209 lb)   SpO2 94%   BMI 26.83 kg/m   Wt Readings from Last 3 Encounters:  11/13/22 94.8 kg (209 lb)  11/09/22 96.2 kg (212 lb)  11/06/22 97.9 kg (215 lb 12.8 oz)    PHYSICAL EXAM: General:  Appears younger than stated age HEENT: normal Neck: supple. no JVD. Carotids 2+ bilat; no bruits.  Cor: PMI nondisplaced. Regular rate & rhythm. No rubs, gallops or murmurs. Lungs: clear Abdomen: soft, nontender, nondistended.  Extremities: no cyanosis, clubbing, rash, edema, decreased pedal pulses Neuro: alert & orientedx3. Affect pleasant   ECG Sinus brady 53 bpm, 1st degree AVB, PVC  Device interrogation (personally  reviewed): OptiVol began increasing in May, peaked this week and now starting to level off, thoracic impedance trending below threshold since May >>now starting to go back up, no VT/VF, no recurrent AFL since cardioversion  ASSESSMENT & PLAN: 1. Chronic systolic CHF due to ICM  - EF 16/1/09 EF 15-20% -> Improved to 25-30% on 04/16/17 Post op CABG as below.  - Echo 08/07/17 read as EF 35-40% but Dr Gala Romney reviewed personally and EF 25-30% (reviewed with Dr. Delton See who agrees) + laminated apical clot and moderate to severe MR. RV ok.  - Cardiac MRI 09/2017 EF 30%, laminated apical thrombus, normal RV size and function, LGE pattern suggests infarction in LAD territory, unlikely to improve with revascularization - S/p Medtronic ICD with Dr Graciela Husbands 11/20/17 - Echo (8/20): EF 25-30% RV ok. Mild MR. Small apical thrombus.  - Echo (11/16/20): EF 20-25% RV mildly reduced No LV clot  - Echo today 09/26/22, results pending, but EF appears similar to prior. - NYHA III-IIIb, but seems to be improving. Also has bilateral leg pain that seems to be hindering functional status - Volume improving. OptiVol elevated but leveling off and thoracic impedance starting to trend up. No significant overload on exam. Continue Torsemide 20 mg daily. - Continue Entresto 97/103 mg bid - Off Farxiga/Jardiance with yeast infections. - Off eplerenone due hyperkalemia (painful gynecomastia with spiro). - Off Coreg with bradycardia.  - Continue BiDil 0.5 tab tid for afterload reduction. - BP elevated today but has been controlled at home. - Labs today - Has CPX planned later this month. Considering CCM with Dr. Graciela Husbands in September. If he has severe HF limitation, wonder if he would be a potential candidate for VAD if renal function and PAD not prohibitive. May need RHC.   2. Paroxysmal Atrial fibrillation/AFL RVR - AFL with RVR 4/23, s/p DCCV -->NSR - s/p DCCV 08/14/21-->NSR - s/p DCCV 11/06/22 --> NSR - Now on 200 mg amiodarone  BID. May decrease amiodarone at follow-up if maintaining SR - Had considered AFL/AF ablation in light of [place]-AF data. Dr. Gala Romney discussed his concerns over procedural GA with him and Dr. Graciela Husbands.  - Continue Eliquis. No bleeding issues.  3. Mitral regurgitation - functional MR  - Moderate to severe on echo 07/2017 - Mild on echo 7/22. - Moderate on echo 9/23 - Trivial on echo 5/24 - HF optimization per above    4. CAD - s/p CABG with LIMA to LAD and SVG to OM on 04/08/2017 - denies CP  - Continue statin. - off ? blocker w/ bradycardia  - No ASA with Eliquis use.   5. Tobacco abuse  - Smoked 1 ppd PTA for > 50 years.  - He has quit - No change.    6. Hx of bladder CA  - s/p multiple rounds of BCG therapy.    7. LV clot - Laminated, small. - On Eliquis for PAF.  - No clot on recent echo.    8. DM2 - On metformin. - Off SGLT2i with yeast infections.   9. CKD 3b - Baseline SCr 1.7 -Scr recently up to 2.27 in setting of volume overload -Recheck labs today. Has been diuresing.   10. HTN - BP elevated today. Has been soft at last visit and well-controlled at home.  If elevated again at follow-up in a few weeks, would consider increasing Bidil. - continue GDMT per above   11. PAD - Complaining of bilateral thigh and calf pain with ambulation. Unable to walk more than 200 feet before he must stop to rest.  - No ischemic rest pain or wounds - ABIs 01/21: R ABI 0.59 and L ABI 0.63. Monophasic doppler waveforms of common femoral arteries c/w aorto-iliac inflow obstruction/stenosis - Discussed repeating ABIs/arterial dopplers and possible referral to our interventional colleagues. He would prefer to wait until after he completes further cardiac testing before proceeding. - Continue statin  Follow up 3-4 weeks with APP, keep follow-up with Dr. Gala Romney in 09/24  [facility], Dalbert Garnet, PA-C 11/13/22

## 2022-11-14 NOTE — Addendum Note (Signed)
Encounter addended by: Andrey Farmer, PA-C on: 11/14/2022 6:57 AM  Actions taken: Order Reconciliation Section accessed, Clinical Note Signed, Result note filed

## 2022-11-20 ENCOUNTER — Encounter (HOSPITAL_COMMUNITY): Payer: Self-pay

## 2022-11-20 ENCOUNTER — Telehealth (HOSPITAL_COMMUNITY): Payer: Self-pay | Admitting: *Deleted

## 2022-11-20 NOTE — Telephone Encounter (Signed)
Left voicemail to remind patient of CPX appointment on Tuesday 7/30 at 11:00am. Provided patient with call back [phone] if cancellation/reschedule is necessary.       Reggy Eye, MS, ACSM, [facility] Clinical Exercise Physiologist/ Health and Wellness Coach

## 2022-11-21 ENCOUNTER — Telehealth (HOSPITAL_COMMUNITY): Payer: Self-pay | Admitting: *Deleted

## 2022-11-21 DIAGNOSIS — I5022 Chronic systolic (congestive) heart failure: Secondary | ICD-10-CM

## 2022-11-21 MED ORDER — TORSEMIDE 20 MG PO TABS
20.0000 mg | ORAL_TABLET | Freq: Every day | ORAL | 3 refills | Status: DC
Start: 2022-11-21 — End: 2023-04-30

## 2022-11-21 NOTE — Telephone Encounter (Signed)
Pt returned call re: his recent lab results. Informed him per Unice Cobble, PA of the following:  "His kidney function is worse, BNP trending down. Would have him check with PCP about using metformin with his kidney function.   He is taking 20 mg Torsemide daily. The medication was refilled by EP yesterday for 20 BID? I can't find that it was increased. I do not suggest taking a higher dose of Torsemide. Would continue 20 mg daily (we can refill this dose instead) and have him check BMET earlier next week and submit ICD interrogation to check OptiVol."  Pt verbalized understanding and will decrease his Torsemide to 20 mg daily. Lab ordered and scheduled for next week. He will send ICD interrogation to his Device Clinic. He will also contact his PCP and discuss metformin and his kidney function. Pt will call us at [phone] if any questions or concerns prior to next visit.

## 2022-11-27 ENCOUNTER — Ambulatory Visit (HOSPITAL_COMMUNITY)
Admission: RE | Admit: 2022-11-27 | Discharge: 2022-11-27 | Disposition: A | Discharge status: Home / Self Care | Payer: Medicare Other | Source: Ambulatory Visit | Attending: Cardiology | Admitting: Cardiology

## 2022-11-27 ENCOUNTER — Ambulatory Visit (HOSPITAL_COMMUNITY): Payer: Medicare Other

## 2022-11-27 DIAGNOSIS — I483 Typical atrial flutter: Secondary | ICD-10-CM

## 2022-11-27 DIAGNOSIS — I509 Heart failure, unspecified: Secondary | ICD-10-CM

## 2022-11-27 DIAGNOSIS — I5022 Chronic systolic (congestive) heart failure: Secondary | ICD-10-CM | POA: Diagnosis present

## 2022-11-27 LAB — BASIC METABOLIC PANEL
Anion gap: 13 (ref 5–15)
BUN: 31 mg/dL — ABNORMAL HIGH (ref 8–23)
CO2: 21 mmol/L — ABNORMAL LOW (ref 22–32)
Calcium: 9.2 mg/dL (ref 8.9–10.3)
Chloride: 101 mmol/L (ref 98–111)
Creatinine, Ser: 2.12 mg/dL — ABNORMAL HIGH (ref 0.61–1.24)
GFR, Estimated: 31 mL/min — ABNORMAL LOW (ref >60)
Glucose, Bld: 157 mg/dL — ABNORMAL HIGH (ref 70–99)
Potassium: 4.3 mmol/L (ref 3.5–5.1)
Sodium: 135 mmol/L (ref 135–145)

## 2022-11-29 ENCOUNTER — Other Ambulatory Visit (HOSPITAL_COMMUNITY): Payer: Medicare Other

## 2022-12-03 ENCOUNTER — Telehealth: Payer: Self-pay

## 2022-12-03 NOTE — Telephone Encounter (Signed)
-----   Message from Sherryl Manges sent at 12/03/2022 10:38 AM EDT ----- Please Inform Patient that CPX  is abnormal and suggests he may benefit from CCM pacing   We touched on this in the office and we should problaly have him come back in/ or over the phone to discuss if he would like to proceed  We should first make more aggressive his rate response as his HR was very limiting   Thanks

## 2022-12-03 NOTE — Telephone Encounter (Signed)
Spoke with pt and advised of CPX result per Dr Graciela Husbands.  Pt is already scheduled for CCM implant on 01/04/2023.  He declines change to his rate response at this time as he is not interested in coming to the office.  Pt verbalizes understanding and thanked Charity fundraiser for the phone call.

## 2022-12-04 ENCOUNTER — Ambulatory Visit (INDEPENDENT_AMBULATORY_CARE_PROVIDER_SITE_OTHER): Payer: Medicare Other

## 2022-12-04 DIAGNOSIS — I4892 Unspecified atrial flutter: Secondary | ICD-10-CM

## 2022-12-04 DIAGNOSIS — I5022 Chronic systolic (congestive) heart failure: Secondary | ICD-10-CM

## 2022-12-04 LAB — CUP PACEART REMOTE DEVICE CHECK
Battery Remaining Longevity: 71 mo
Battery Voltage: 2.99 V
Brady Statistic RV Percent Paced: 0.41 %
Date Time Interrogation Session: 20240806001604
HighPow Impedance: 70 Ohm
Implantable Lead Connection Status: 753985
Implantable Lead Implant Date: 20190724
Implantable Lead Location: 753860
Implantable Pulse Generator Implant Date: 20190724
Lead Channel Impedance Value: 323 Ohm
Lead Channel Impedance Value: 380 Ohm
Lead Channel Pacing Threshold Amplitude: 0.625 V
Lead Channel Pacing Threshold Pulse Width: 0.4 ms
Lead Channel Sensing Intrinsic Amplitude: 11 mV
Lead Channel Sensing Intrinsic Amplitude: 11 mV
Lead Channel Setting Pacing Amplitude: 2.5 V
Lead Channel Setting Pacing Pulse Width: 0.4 ms
Lead Channel Setting Sensing Sensitivity: 0.3 mV
Zone Setting Status: 755011

## 2022-12-11 ENCOUNTER — Ambulatory Visit (HOSPITAL_COMMUNITY)
Admission: RE | Admit: 2022-12-11 | Discharge: 2022-12-11 | Disposition: A | Discharge status: Home / Self Care | Payer: Medicare Other | Source: Ambulatory Visit | Attending: Cardiology | Admitting: Cardiology

## 2022-12-11 ENCOUNTER — Encounter (HOSPITAL_COMMUNITY): Payer: Self-pay

## 2022-12-11 VITALS — BP 146/68 | HR 63 | Wt 212.2 lb

## 2022-12-11 DIAGNOSIS — N1832 Chronic kidney disease, stage 3b: Secondary | ICD-10-CM | POA: Insufficient documentation

## 2022-12-11 DIAGNOSIS — I5042 Chronic combined systolic (congestive) and diastolic (congestive) heart failure: Secondary | ICD-10-CM

## 2022-12-11 DIAGNOSIS — I255 Ischemic cardiomyopathy: Secondary | ICD-10-CM | POA: Insufficient documentation

## 2022-12-11 DIAGNOSIS — I480 Paroxysmal atrial fibrillation: Secondary | ICD-10-CM | POA: Insufficient documentation

## 2022-12-11 DIAGNOSIS — E1151 Type 2 diabetes mellitus with diabetic peripheral angiopathy without gangrene: Secondary | ICD-10-CM | POA: Insufficient documentation

## 2022-12-11 DIAGNOSIS — Z8551 Personal history of malignant neoplasm of bladder: Secondary | ICD-10-CM | POA: Insufficient documentation

## 2022-12-11 DIAGNOSIS — E1122 Type 2 diabetes mellitus with diabetic chronic kidney disease: Secondary | ICD-10-CM | POA: Insufficient documentation

## 2022-12-11 DIAGNOSIS — Z7901 Long term (current) use of anticoagulants: Secondary | ICD-10-CM | POA: Diagnosis not present

## 2022-12-11 DIAGNOSIS — Z951 Presence of aortocoronary bypass graft: Secondary | ICD-10-CM | POA: Diagnosis not present

## 2022-12-11 DIAGNOSIS — J449 Chronic obstructive pulmonary disease, unspecified: Secondary | ICD-10-CM | POA: Diagnosis not present

## 2022-12-11 DIAGNOSIS — I110 Hypertensive heart disease with heart failure: Secondary | ICD-10-CM | POA: Diagnosis present

## 2022-12-11 DIAGNOSIS — I130 Hypertensive heart and chronic kidney disease with heart failure and stage 1 through stage 4 chronic kidney disease, or unspecified chronic kidney disease: Secondary | ICD-10-CM | POA: Diagnosis not present

## 2022-12-11 DIAGNOSIS — I5022 Chronic systolic (congestive) heart failure: Secondary | ICD-10-CM | POA: Insufficient documentation

## 2022-12-11 DIAGNOSIS — I2510 Atherosclerotic heart disease of native coronary artery without angina pectoris: Secondary | ICD-10-CM | POA: Diagnosis not present

## 2022-12-11 DIAGNOSIS — Z87891 Personal history of nicotine dependence: Secondary | ICD-10-CM | POA: Diagnosis not present

## 2022-12-11 DIAGNOSIS — Z7984 Long term (current) use of oral hypoglycemic drugs: Secondary | ICD-10-CM | POA: Diagnosis not present

## 2022-12-11 DIAGNOSIS — Z9221 Personal history of antineoplastic chemotherapy: Secondary | ICD-10-CM | POA: Insufficient documentation

## 2022-12-11 DIAGNOSIS — I340 Nonrheumatic mitral (valve) insufficiency: Secondary | ICD-10-CM | POA: Insufficient documentation

## 2022-12-11 LAB — BASIC METABOLIC PANEL
Anion gap: 10 (ref 5–15)
BUN: 26 mg/dL — ABNORMAL HIGH (ref 8–23)
CO2: 26 mmol/L (ref 22–32)
Calcium: 9 mg/dL (ref 8.9–10.3)
Chloride: 102 mmol/L (ref 98–111)
Creatinine, Ser: 2.02 mg/dL — ABNORMAL HIGH (ref 0.61–1.24)
GFR, Estimated: 33 mL/min — ABNORMAL LOW (ref >60)
Glucose, Bld: 172 mg/dL — ABNORMAL HIGH (ref 70–99)
Potassium: 4.3 mmol/L (ref 3.5–5.1)
Sodium: 138 mmol/L (ref 135–145)

## 2022-12-11 LAB — BRAIN NATRIURETIC PEPTIDE: B Natriuretic Peptide: 661.7 pg/mL — ABNORMAL HIGH (ref 0.0–100.0)

## 2022-12-11 NOTE — Progress Notes (Signed)
Advanced Heart Failure Clinic Note   PCP: [name], MD HF Cardiologist: Dr. Gala Romney   HPI: [name] [name] is a 77 y.o. 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   Pt admitted in 12/18 with anterior STEMI and developed cardiogenic shock requiring Impella support. Cath showed severe 3v CAD. While on impella, pt developed sepsis with WBC ~ 40K, UCx + pseudomonas. Stabilized and underwent CABG x 2 with LIMA to LAD and SVG to OM on 04/08/2017. Pt transiently required milrinone but weaned prior to discharge. Required amiodarone for brief post op afib, but had no recurrence.   Echo 08/07/17 EF 35-40% laminated apical clot. (Dr.Bensimhon felt ~ 25-30%) and moderate to severe MR.   cMRI 6/19 EF 30%, laminated apical thrombus, normal RV size and function, LGE pattern suggests infarction in LAD territory, unlikely to improve with revascularization  S/p Medtronic ICD with Dr Graciela Husbands 11/20/17.  Saw Dr. Graciela Husbands on 01/07/20 and was in AF with RVR. Potassium also high so Inspra stopped. Dr. Odessa Fleming note also mentions chronotropic incompetence with exercise. Carvedilol cut back to 3.125 bid.   Echo 07/22 EF 20-25% , RV mildly reduced, no LV clot.  Admitted 4/23 with AFL with RVR. Underwent DCCV to NSR. Presented back to ED next day with a/c CHF. Post hospital follow up, he was volume overloaded and back in AFL RVR. Amiodarone started and arranged for DCCV.  S/p DCCV 08/10/21 to NSR.  Echo 9/23 EF 20-25%, RV mildly reduced, moderate MR, mild to moderate AI  Echo 05/24: EF 25-30%, RV mildly reduced  Has recently been struggling with recurrent AFL and volume overload.  He underwent DCCV to SR on 07/12.   CPX recently cancelled d/t recurrent atrial arrhythmias. Plan is to consider CCM vs possible workup for VAD pending results of  CPX scheduled 07/30. CCM implant currently scheduled for September.   Last seen in HF clinic 07/09. He was volume overloaded. Furosemide switched to 20 mg Torsemide daily.  He is here today for close follow-up.   He is feeling much better this week. Home weight down 6 lb. Dyspnea and abdominal bloating improved. No longer having orthopnea. [facility] Notes occasional dizziness with activity. Activity mostly limited by pain that starts in his thighs and moves to his calves after walking about 200 feet.  Cardiac Studies:  - Echo (9/23): EF 20-25%, RV mildly reduced, moderate MR, mild to moderate AI  - Echo (11/16/20): EF 20-25% RV mildly reduced No LV clot   - Echo (8/20): EF 25-30% RV ok. Small apical thrombus.  - L/RHC 03/31/2017 Prox RCA lesion is 100% stenosed. Dist RCA lesion is 100% stenosed (retrograde filling from collaterals does not go beyond the distal vessel) -->faint collateral filling to the distal PL and PDA from septal perforators and a diffusely diseased AV groove circumflex LAV Groove lesion is 90% stenosed. Prox LAD-1 lesion is 95% stenosed. Prox LAD-2 lesion is 85% stenosed. Heavily calcified, tandem lesions in very tortuous segment of the vessel. Prox Cx to Mid Cx lesion is 70% stenosed. Heavily calcified There is severe left ventricular systolic dysfunction. The left ventricular ejection fraction is less than 25% by visual estimate. LV end diastolic pressure is severely elevated -prior to Impella insertion Hemodynamic findings consistent with moderate pulmonary hypertension.  Successful Impella insertion with 3.5-3.6 LPM flow's  - Echo 04/01/2017 EF 15-20%   - Echo 04/16/17 (post op) Improved to 25-30% on 04/16/17  - Echo 08/07/17: EF 35-40%, mod to severe MR  Review of systems complete and found to be negative unless listed in HPI.   Past Medical History:  Diagnosis Date   AICD (automatic cardioverter/defibrillator) present     Arthritis    Bladder cancer (HCC)    Borderline glaucoma    Chronic systolic (congestive) heart failure (HCC)    Coronary artery disease    Diabetes mellitus type 2, controlled (HCC) [facility]   Essential hypertension    Heart murmur    Hepatitis    Medical history non-contributory    Paroxysmal A-fib (HCC)    Paroxysmal A-fib (HCC) 08/20/2020   Peripheral vascular disease (HCC)    Pneumonia    Current Outpatient Medications  Medication Sig Dispense Refill   amiodarone (PACERONE) 200 MG tablet Take 1 tablet (200 mg total) by mouth 2 (two) times daily. 180 tablet 3   amLODipine (NORVASC) 5 MG tablet Take 1 tablet (5 mg total) by mouth daily. 90 tablet 3   atorvastatin (LIPITOR) 80 MG tablet TAKE 1 TABLET BY MOUTH EVERY DAY 90 tablet 3   ELIQUIS 5 MG TABS tablet TAKE 1 TABLET TWICE A DAY 180 tablet 3   ENTRESTO 97-103 MG TAKE 1 TABLET TWICE A DAY 180 tablet 3   isosorbide-hydrALAZINE (BIDIL) 20-37.5 MG tablet Take 0.5 tablets by mouth 3 (three) times daily. 100 tablet 3   metFORMIN (GLUCOPHAGE-XR) 500 MG 24 hr tablet Take 1 tablet (500 mg total) by mouth daily with breakfast.     oxymetazoline (DRISTAN) 0.05 % nasal spray Place 1 spray into both nostrils at bedtime as needed for congestion.     torsemide (DEMADEX) 20 MG tablet Take 1 tablet (20 mg total) by mouth daily. 90 tablet 3   No current facility-administered medications for this encounter.   Allergies  Allergen Reactions   Spironolactone Other (See Comments)    Painful gynecomastia   Niacin And Related Hives   Social History   Socioeconomic History   Marital status: Married    Spouse name: Not on file   Number of children: Not on file   Years of education: Not on file   Highest education level: Not on file  Occupational History   Occupation: retired  Tobacco Use   Smoking status: Former    Current packs/day: 0.00    Average packs/day: 1 pack/day for 40.0 years (40.0 ttl pk-yrs)    Types: Cigarettes    Start  date: 03/31/1977    Quit date: 03/31/2017    Years since quitting: 5.7   Smokeless tobacco: Never  Vaping Use   Vaping status: Never Used  Substance and Sexual Activity   Alcohol use: Not Currently    Comment: rarely   Drug use: No   Sexual activity: Not on file  Other Topics Concern   Not on file  Social History Narrative   Not on file   Social Determinants of Health   Financial Resource Strain: Not on file  Food Insecurity: No Food Insecurity (01/04/2022)   Hunger Vital Sign    Worried About Running Out of Food in the Last Year: Never true    Ran Out of Food in the Last Year: Never true  Transportation Needs: No Transportation Needs (01/04/2022)   PRAPARE - Administrator, Civil Service (Medical): No  Lack of Transportation (Non-Medical): No  Physical Activity: Inactive (07/11/2017)   Exercise Vital Sign    Days of Exercise per Week: 0 days    Minutes of Exercise per Session: 0 min  Stress: No Stress Concern Present (07/11/2017)   [employer] of Occupational Health - Occupational Stress Questionnaire    Feeling of Stress : Only a little  Social Connections: Not on file  Intimate Partner Violence: Not on file   Family History  Problem Relation Age of Onset   Heart attack Sister    Heart disease Sister    BP (!) 154/72   Pulse 63   Wt 96.3 kg (212 lb 3.2 oz)   SpO2 95%   BMI 27.24 kg/m   Wt Readings from Last 3 Encounters:  12/11/22 96.3 kg (212 lb 3.2 oz)  11/13/22 94.8 kg (209 lb)  11/09/22 96.2 kg (212 lb)    PHYSICAL EXAM: General:  Appears younger than stated age HEENT: normal Neck: supple. no JVD. Carotids 2+ bilat; no bruits.  Cor: PMI nondisplaced. Regular rate & rhythm. No rubs, gallops or murmurs. Lungs: clear Abdomen: soft, nontender, nondistended.  Extremities: no cyanosis, clubbing, rash, edema, decreased pedal pulses Neuro: alert & orientedx3. Affect pleasant   ECG Sinus brady 53 bpm, 1st degree AVB, PVC  Device interrogation  (personally reviewed): OptiVol began increasing in May, peaked this week and now starting to level off, thoracic impedance trending below threshold since May >>now starting to go back up, no VT/VF, no recurrent AFL since cardioversion  ASSESSMENT & PLAN: 1. Chronic systolic CHF due to ICM  - EF 64/4/03 EF 15-20% -> Improved to 25-30% on 04/16/17 Post op CABG as below.  - Echo 08/07/17 read as EF 35-40% but Dr Gala Romney reviewed personally and EF 25-30% (reviewed with Dr. Delton See who agrees) + laminated apical clot and moderate to severe MR. RV ok.  - Cardiac MRI 09/2017 EF 30%, laminated apical thrombus, normal RV size and function, LGE pattern suggests infarction in LAD territory, unlikely to improve with revascularization - S/p Medtronic ICD with Dr Graciela Husbands 11/20/17 - Echo (8/20): EF 25-30% RV ok. Mild MR. Small apical thrombus.  - Echo (11/16/20): EF 20-25% RV mildly reduced No LV clot  - Echo today 09/26/22, results pending, but EF appears similar to prior. - NYHA III-IIIb, but seems to be improving. Also has bilateral leg pain that seems to be hindering functional status - Volume improving. OptiVol elevated but leveling off and thoracic impedance starting to trend up. No significant overload on exam. Continue Torsemide 20 mg daily. - Continue Entresto 97/103 mg bid - Off Farxiga/Jardiance with yeast infections. - Off eplerenone due hyperkalemia (painful gynecomastia with spiro). - Off Coreg with bradycardia.  - Continue BiDil 0.5 tab tid for afterload reduction. - BP elevated today but has been controlled at home. - Labs today - Has CPX planned later this month. Considering CCM with Dr. Graciela Husbands in September. If he has severe HF limitation, wonder if he would be a potential candidate for VAD if renal function and PAD not prohibitive. May need RHC.   2. Paroxysmal Atrial fibrillation/AFL RVR - AFL with RVR 4/23, s/p DCCV -->NSR - s/p DCCV 08/14/21-->NSR - s/p DCCV 11/06/22 --> NSR - Now on 200 mg  amiodarone BID. May decrease amiodarone at follow-up if maintaining SR - Had considered AFL/AF ablation in light of [place]-AF data. Dr. Gala Romney discussed his concerns over procedural GA with him and Dr. Graciela Husbands.  - Continue Eliquis. No bleeding issues.  3. Mitral regurgitation - functional MR  - Moderate to severe on echo 07/2017 - Mild on echo 7/22. - Moderate on echo 9/23 - Trivial on echo 5/24 - HF optimization per above    4. CAD - s/p CABG with LIMA to LAD and SVG to OM on 04/08/2017 - denies CP  - Continue statin. - off ? blocker w/ bradycardia  - No ASA with Eliquis use.   5. Tobacco abuse  - Smoked 1 ppd PTA for > 50 years.  - He has quit - No change.    6. Hx of bladder CA  - s/p multiple rounds of BCG therapy.    7. LV clot - Laminated, small. - On Eliquis for PAF.  - No clot on recent echo.    8. DM2 - On metformin. - Off SGLT2i with yeast infections.   9. CKD 3b - Baseline SCr 1.7 -Scr recently up to 2.27 in setting of volume overload -Recheck labs today. Has been diuresing.   10. HTN - BP elevated today. Has been soft at last visit and well-controlled at home.  If elevated again at follow-up in a few weeks, would consider increasing Bidil. - continue GDMT per above   11. PAD - Complaining of bilateral thigh and calf pain with ambulation. Unable to walk more than 200 feet before he must stop to rest.  - No ischemic rest pain or wounds - ABIs 01/21: R ABI 0.59 and L ABI 0.63. Monophasic doppler waveforms of common femoral arteries c/w aorto-iliac inflow obstruction/stenosis - Discussed repeating ABIs/arterial dopplers and possible referral to our interventional colleagues. He would prefer to wait until after he completes further cardiac testing before proceeding. - Continue statin  Follow up 3-4 weeks with APP, keep follow-up with Dr. Gala Romney in 09/24  Robbie Lis, PA-C 12/11/22

## 2022-12-11 NOTE — Patient Instructions (Signed)
Medication Changes:  TAKE AN EXTRA DOSE OF TORSEMIDE EITHER TODAY OR TOMORROW MORNING   Lab Work:  Labs done today, your results will be available in MyChart, we will contact you for abnormal readings.  Follow-Up in: AS SCHEDULED WITH DR. Bishop Dublin   At the [facility], you and your health needs are our priority. We have a designated team specialized in the treatment of Heart Failure. This Care Team includes your primary Heart Failure Specialized Cardiologist (physician), Advanced Practice Providers (APPs- Physician Assistants and Nurse Practitioners), and Pharmacist who all work together to provide you with the care you need, when you need it.   You may see any of the following providers on your designated Care Team at your next follow up:  Dr. Arvilla Meres Dr. Marca Ancona Dr. Marcos Eke, NP Robbie Lis, [place] [facility] [place], [place] Brynda Peon, NP Karle Plumber, PharmD   Please be sure to bring in all your medications bottles to every appointment.   Need to Contact us:  If you have any questions or concerns before your next appointment please send [place] a message through [place] or call our office at [phone].    TO LEAVE A MESSAGE FOR THE NURSE SELECT OPTION 2, PLEASE LEAVE A MESSAGE INCLUDING: YOUR NAME DATE OF BIRTH CALL BACK NUMBER REASON FOR CALL**this is important as we prioritize the call backs  YOU WILL RECEIVE A CALL BACK THE SAME DAY AS LONG AS YOU CALL BEFORE 4:00 PM

## 2022-12-14 ENCOUNTER — Encounter: Payer: Self-pay | Admitting: Internal Medicine

## 2022-12-20 NOTE — Progress Notes (Signed)
Remote ICD transmission.   

## 2022-12-30 ENCOUNTER — Encounter: Payer: Self-pay | Admitting: Internal Medicine

## 2022-12-31 ENCOUNTER — Encounter: Payer: Self-pay | Admitting: Internal Medicine

## 2023-01-02 ENCOUNTER — Telehealth: Payer: Self-pay

## 2023-01-02 NOTE — Telephone Encounter (Signed)
Referred to ICM clinic by Luisa Dago, PA at HF clinic.  Spoke with patient and ICM intro given.  Patient is agreeable to ICM monthly follow up.  Advised transmission will send automatically when scheduled.  Provided ICM direct number and explained should call if experiencing any fluid symptoms such as weight gain, shortness of breath or extremity/abdominal swelling.   1st ICM remote transmission scheduled for 01/14/2023.

## 2023-01-02 NOTE — Telephone Encounter (Signed)
Allayne Butcher, PA-C  Zoe Nordin, Josephine Igo, RN Hey can you enroll for monthly monitoring?

## 2023-01-03 NOTE — Pre-Procedure Instructions (Signed)
Instructed patient on the following items: Arrival time 515 Nothing to eat or drink after midnight No meds AM of procedure Responsible person to drive you home and stay with you for 24 hrs Wash with special soap night before and morning of procedure If on anti-coagulant drug instructions Eliquis- stopped on 9/3.

## 2023-01-04 ENCOUNTER — Other Ambulatory Visit: Payer: Self-pay

## 2023-01-04 ENCOUNTER — Ambulatory Visit (HOSPITAL_COMMUNITY): Payer: Medicare Other

## 2023-01-04 ENCOUNTER — Ambulatory Visit (HOSPITAL_COMMUNITY)
Admission: RE | Disposition: A | Discharge status: Home / Self Care | Payer: Self-pay | Source: Home / Self Care | Attending: Internal Medicine

## 2023-01-04 ENCOUNTER — Ambulatory Visit (HOSPITAL_COMMUNITY)
Admission: RE | Admit: 2023-01-04 | Discharge: 2023-01-04 | Disposition: A | Discharge status: Home / Self Care | Payer: Medicare Other | Attending: Internal Medicine | Admitting: Internal Medicine

## 2023-01-04 DIAGNOSIS — I130 Hypertensive heart and chronic kidney disease with heart failure and stage 1 through stage 4 chronic kidney disease, or unspecified chronic kidney disease: Secondary | ICD-10-CM | POA: Insufficient documentation

## 2023-01-04 DIAGNOSIS — I4819 Other persistent atrial fibrillation: Secondary | ICD-10-CM | POA: Diagnosis not present

## 2023-01-04 DIAGNOSIS — Z95810 Presence of automatic (implantable) cardiac defibrillator: Secondary | ICD-10-CM | POA: Insufficient documentation

## 2023-01-04 DIAGNOSIS — D649 Anemia, unspecified: Secondary | ICD-10-CM | POA: Diagnosis not present

## 2023-01-04 DIAGNOSIS — Z79899 Other long term (current) drug therapy: Secondary | ICD-10-CM | POA: Diagnosis not present

## 2023-01-04 DIAGNOSIS — Z951 Presence of aortocoronary bypass graft: Secondary | ICD-10-CM | POA: Diagnosis not present

## 2023-01-04 DIAGNOSIS — I255 Ischemic cardiomyopathy: Secondary | ICD-10-CM | POA: Insufficient documentation

## 2023-01-04 DIAGNOSIS — E1122 Type 2 diabetes mellitus with diabetic chronic kidney disease: Secondary | ICD-10-CM | POA: Insufficient documentation

## 2023-01-04 DIAGNOSIS — N1832 Chronic kidney disease, stage 3b: Secondary | ICD-10-CM | POA: Insufficient documentation

## 2023-01-04 DIAGNOSIS — R001 Bradycardia, unspecified: Secondary | ICD-10-CM | POA: Diagnosis not present

## 2023-01-04 DIAGNOSIS — I4892 Unspecified atrial flutter: Secondary | ICD-10-CM | POA: Diagnosis not present

## 2023-01-04 DIAGNOSIS — I252 Old myocardial infarction: Secondary | ICD-10-CM | POA: Insufficient documentation

## 2023-01-04 DIAGNOSIS — I5042 Chronic combined systolic (congestive) and diastolic (congestive) heart failure: Secondary | ICD-10-CM

## 2023-01-04 DIAGNOSIS — Z87891 Personal history of nicotine dependence: Secondary | ICD-10-CM | POA: Insufficient documentation

## 2023-01-04 DIAGNOSIS — N183 Chronic kidney disease, stage 3 unspecified: Secondary | ICD-10-CM

## 2023-01-04 DIAGNOSIS — I483 Typical atrial flutter: Secondary | ICD-10-CM

## 2023-01-04 DIAGNOSIS — I5022 Chronic systolic (congestive) heart failure: Secondary | ICD-10-CM | POA: Insufficient documentation

## 2023-01-04 DIAGNOSIS — I513 Intracardiac thrombosis, not elsewhere classified: Secondary | ICD-10-CM

## 2023-01-04 HISTORY — PX: CCM GENERATOR INSERTION: EP1262

## 2023-01-04 LAB — CBC
HCT: 41.3 % (ref 39.0–52.0)
Hemoglobin: 12.9 g/dL — ABNORMAL LOW (ref 13.0–17.0)
MCH: 28 pg (ref 26.0–34.0)
MCHC: 31.2 g/dL (ref 30.0–36.0)
MCV: 89.8 fL (ref 80.0–100.0)
Platelets: 263 10*3/uL (ref 150–400)
RBC: 4.6 MIL/uL (ref 4.22–5.81)
RDW: 15.3 % (ref 11.5–15.5)
WBC: 9.5 10*3/uL (ref 4.0–10.5)
nRBC: 0 % (ref 0.0–0.2)

## 2023-01-04 LAB — GLUCOSE, CAPILLARY: Glucose-Capillary: 127 mg/dL — ABNORMAL HIGH (ref 70–99)

## 2023-01-04 SURGERY — CCM GENERATOR INSERTION

## 2023-01-04 MED ORDER — CEFAZOLIN SODIUM-DEXTROSE 2-4 GM/100ML-% IV SOLN
INTRAVENOUS | Status: AC
  Filled 2023-01-04: qty 100

## 2023-01-04 MED ORDER — MIDAZOLAM HCL 2 MG/2ML IJ SOLN
INTRAMUSCULAR | Status: AC
  Filled 2023-01-04: qty 2

## 2023-01-04 MED ORDER — LIDOCAINE HCL (PF) 1 % IJ SOLN
INTRAMUSCULAR | Status: DC | PRN
  Administered 2023-01-04: 60 mL

## 2023-01-04 MED ORDER — ONDANSETRON HCL 4 MG/2ML IJ SOLN: 4.0000 mg | Freq: Four times a day (QID) | INTRAMUSCULAR | Status: DC | PRN

## 2023-01-04 MED ORDER — CHLORHEXIDINE GLUCONATE 4 % EX SOLN: 4.0000 | Freq: Once | CUTANEOUS | Status: DC

## 2023-01-04 MED ORDER — CEFAZOLIN SODIUM-DEXTROSE 2-4 GM/100ML-% IV SOLN
2.0000 g | INTRAVENOUS | Status: AC
  Administered 2023-01-04: 2 g via INTRAVENOUS

## 2023-01-04 MED ORDER — SODIUM CHLORIDE 0.9 % IV SOLN: INTRAVENOUS | Status: DC

## 2023-01-04 MED ORDER — LIDOCAINE HCL (PF) 1 % IJ SOLN
INTRAMUSCULAR | Status: AC
  Filled 2023-01-04: qty 60

## 2023-01-04 MED ORDER — HEPARIN (PORCINE) IN NACL 1000-0.9 UT/500ML-% IV SOLN
INTRAVENOUS | Status: DC | PRN
  Administered 2023-01-04: 500 mL

## 2023-01-04 MED ORDER — FENTANYL CITRATE (PF) 100 MCG/2ML IJ SOLN
INTRAMUSCULAR | Status: DC | PRN
  Administered 2023-01-04 (×2): 25 ug via INTRAVENOUS

## 2023-01-04 MED ORDER — SODIUM CHLORIDE 0.9 % IV SOLN
INTRAVENOUS | Status: AC
  Filled 2023-01-04: qty 2

## 2023-01-04 MED ORDER — FENTANYL CITRATE (PF) 100 MCG/2ML IJ SOLN
INTRAMUSCULAR | Status: AC
  Filled 2023-01-04: qty 2

## 2023-01-04 MED ORDER — SODIUM CHLORIDE 0.9 % IV SOLN
80.0000 mg | INTRAVENOUS | Status: AC
  Administered 2023-01-04: 80 mg

## 2023-01-04 MED ORDER — MIDAZOLAM HCL 5 MG/5ML IJ SOLN
INTRAMUSCULAR | Status: DC | PRN
  Administered 2023-01-04: 1 mg via INTRAVENOUS
  Administered 2023-01-04: 2 mg via INTRAVENOUS

## 2023-01-04 MED ORDER — ACETAMINOPHEN 325 MG PO TABS: 325.0000 mg | ORAL_TABLET | ORAL | Status: DC | PRN

## 2023-01-04 SURGICAL SUPPLY — 12 items
CABLE SURGICAL S-101-97-12 (CABLE) ×1 IMPLANT
CATH RIGHTSITE C315HIS02 (CATHETERS) IMPLANT
GEN PULSE OPTIMIZER SMART MINI (Generator) ×1 IMPLANT
GENERATOR PLS OPTMZR SMRT MINI (Generator) IMPLANT
HEMOSTAT SURGICEL 2X4 FIBR (HEMOSTASIS) IMPLANT
LEAD SELECT SECURE 3830 383069 (Lead) IMPLANT
PAD DEFIB RADIO PHYSIO CONN (PAD) ×1 IMPLANT
SELECT SECURE 3830 383069 (Lead) ×2 IMPLANT
SHEATH 7FR PRELUDE SNAP 13 (SHEATH) IMPLANT
SLITTER 6232ADJ (MISCELLANEOUS) IMPLANT
TRAY PACEMAKER INSERTION (PACKS) ×1 IMPLANT
WIRE HI TORQ VERSACORE-J 145CM (WIRE) IMPLANT

## 2023-01-04 NOTE — Discharge Instructions (Addendum)
After Your Cardiac Device   You have a [employer] Cardiac Device  ACTIVITY Do not lift your arm above shoulder height for 1 week after your procedure. After 7 days, you may progress as below.  You should remove your sling 24 hours after your procedure, unless otherwise instructed by your provider.     Friday January 11, 2023  Saturday January 12, 2023 Sunday January 13, 2023 Monday January 14, 2023   Do not lift, push, pull, or carry anything over 10 pounds with the affected arm until 6 weeks (Friday February 15, 2023 ) after your procedure.   You may drive AFTER your wound check, unless you have been told otherwise by your provider.   Ask your healthcare provider when you can go back to work   INCISION/Dressing If you are on a blood thinner such as Coumadin, Xarelto, Eliquis, Plavix, or Pradaxa please confirm with your provider when this should be resumed. Resume Eliquis on Sunday 01/06/23  If large square, outer bandage is left in place, this can be removed after 24 hours from your procedure. Do not remove steri-strips or glue as below.   If a PRESSURE DRESSING (a bulky dressing that usually goes up over your shoulder) was applied or left in place, please follow instructions given by your provider on when to return to have this removed.   Monitor your cardiac device site for redness, swelling, and drainage. Call the device clinic at [phone] if you experience these symptoms or fever/chills.  If your incision is closed with Dermabond/Surgical glue. You may shower 1 day after your cardiac device implant and wash around the site with soap and water.    If you were discharged in a sling, please do not wear this during the day more than 48 hours after your surgery unless otherwise instructed. This may increase the risk of stiffness and soreness in your shoulder.   Avoid lotions, ointments, or perfumes over your incision until it is well-healed.  You may use a hot tub or  a pool AFTER your wound check appointment if the incision is completely closed.   DEVICE MANAGEMENT You should receive your ID card for your new device in 4-8 weeks. Keep this card with you at all times once received. Consider wearing a medical alert bracelet or necklace.  Please follow manufacture instructions for keeping your device charged carefully. This should be performed every 2 weeks at the very least.   Your cardiac device may be MRI compatible. This will be discussed at your next office visit/wound check.  You should avoid contact with strong electric or magnetic fields.   Do not use amateur (ham) radio equipment or electric (arc) welding torches. MP3 player headphones with magnets should not be used. Some devices are safe to use if held at least 12 inches (30 cm) from your cardiac device. These include power tools, lawn mowers, and speakers. If you are unsure if something is safe to use, ask your health care provider.  When using your cell phone, hold it to the ear that is on the opposite side from the cardiac device. Do not leave your cell phone in a pocket over the cardiac device.  You may safely use electric blankets, heating pads, computers, and microwave ovens.  Call the office right away if: You have chest pain. You feel more short of breath than you have felt before. You feel more light-headed than you have felt before. Your incision starts to open up.  This information is  not intended to replace advice given to you by your health care provider. Make sure you discuss any questions you have with your health care provider.

## 2023-01-04 NOTE — H&P (Signed)
Patient Care Team: [name], MD as PCP - General (Family Medicine) Bensimhon, Bevelyn Buckles, MD as PCP - Advanced Heart Failure (Cardiology) Bensimhon, Bevelyn Buckles, MD as PCP - Cardiology (Cardiology) Duke Salvia, MD as PCP - Electrophysiology (Cardiology)   HPI  [name] [name] is a 77 y.o. male admitted for CCM implantation   Ischemic cardiomyopathy having presented 12/18 with anterior MI and shock requiring impella support.  Further complicated by sepsis.  S/P CABG with postop afib; recurrent atrial flutter on amio  CPX>> marked HF limitation but also w chronotropic incompetence max HR 79  DATE TEST EF    12/18 LHC  25 % RCA T; LAD 95; Cx 70%  4/19 Echo  25-30% Mod-sev MR  6/19 cMRI 30%    8/20 Echo  25-30%    7/22 Echo  20-25%    9/23 Echo  20-25% MR mod             Date Cr K Hgb TSH LFTs  7/19 1.18 4.7 12.5      8/20  1.41 4.9<<5.3 15.7      10/21 1.41 4.9 16.1      5/23 1.88 5.0 16.7      8/24 2.02 4.3 12.9    Thromboembolic risk factors ( age  -2, HTN-1, DM-1, Vasc disease -1, CHF-1) for a CHADSVASc Score of >=6     Records and Results Reviewed   Past Medical History:  Diagnosis Date   AICD (automatic cardioverter/defibrillator) present    Arthritis    Bladder cancer (HCC)    Borderline glaucoma    Chronic systolic (congestive) heart failure (HCC)    Coronary artery disease    Diabetes mellitus type 2, controlled (HCC) ORAL MED   Essential hypertension    Heart murmur    Hepatitis    Medical history non-contributory    Paroxysmal A-fib (HCC)    Paroxysmal A-fib (HCC) 08/20/2020   Peripheral vascular disease (HCC)    Pneumonia     Past Surgical History:  Procedure Laterality Date   CARDIOVERSION N/A 08/10/2021   Procedure: CARDIOVERSION;  Surgeon: Dolores Patty, MD;  Location: [facility] ENDOSCOPY;  Service: Cardiovascular;  Laterality: N/A;   CARDIOVERSION N/A 11/09/2022   Procedure: CARDIOVERSION;  Surgeon: Maisie Fus, MD;  Location: [facility]  INVASIVE CV LAB;  Service: Cardiovascular;  Laterality: N/A;   CAROTID DUPLEX SCAN  07-26-2010   BILATERAL ICA  STENOSIS 1% - 39%   CHOLECYSTECTOMY N/A 08/22/2020   Procedure: LAPAROSCOPIC CHOLECYSTECTOMY;  Surgeon: Emelia Loron, MD;  Location: [facility] OR;  Service: General;  Laterality: N/A;   CORONARY ARTERY BYPASS GRAFT N/A 04/08/2017   Procedure: CORONARY ARTERY BYPASS GRAFTING (CABG) TIMES TWO USING LEFT INTERNAL MAMMARY ARTERY AND LEFT SAPHENOUS LEG VEIN HARVESTED ENDOSCOPICALLY.  LEG VEIN ALSO HARVESTED FROM THE RIGHT LEG;  Surgeon: Alleen Borne, MD;  Location: [facility] OR;  Service: Open Heart Surgery;  Laterality: N/A;   CYSTOSCOPY WITH BIOPSY N/A 08/01/2012   Procedure: CYSTOSCOPY WITH BIOPSY BLADDER BIOPSY   ;  Surgeon: Antony Haste, MD;  Location: [facility];  Service: Urology;  Laterality: N/A;   ERCP N/A 08/21/2020   Procedure: ENDOSCOPIC RETROGRADE CHOLANGIOPANCREATOGRAPHY (ERCP);  Surgeon: Jeani Hawking, MD;  Location: [facility] ENDOSCOPY;  Service: Endoscopy;  Laterality: N/A;   FULGURATION OF BLADDER TUMOR N/A 08/01/2012   Procedure: FULGURATION OF BLADDER TUMOR;  Surgeon: Antony Haste, MD;  Location: [facility];  Service: Urology;  Laterality: N/A;   ICD IMPLANT  11/20/2017   ICD IMPLANT N/A 11/20/2017   Procedure: ICD IMPLANT;  Surgeon: Duke Salvia, MD;  Location: [facility] INVASIVE CV LAB;  Service: Cardiovascular;  Laterality: N/A;   LEFT HEART CATH AND CORONARY ANGIOGRAPHY N/A 03/31/2017   Procedure: LEFT HEART CATH AND CORONARY ANGIOGRAPHY;  Surgeon: Marykay Lex, MD;  Location: [facility] INVASIVE CV LAB;  Service: Cardiovascular;  Laterality: N/A;   LUMBAR LAMINECTOMY/DECOMPRESSION MICRODISCECTOMY Bilateral 06/16/2019   Procedure: Bilateral Lumbar Four-Five Laminectomy and Foraminotomy;  Surgeon: Julio Sicks, MD;  Location: [facility] OR;  Service: Neurosurgery;  Laterality: Bilateral;  posterior   REMOVAL OF STONES  08/21/2020   Procedure: REMOVAL  OF STONES;  Surgeon: Jeani Hawking, MD;  Location: [facility] ENDOSCOPY;  Service: Endoscopy;;   RIGHT HEART CATH Right 03/31/2017   Procedure: RIGHT HEART CATH;  Surgeon: Marykay Lex, MD;  Location: [facility] INVASIVE CV LAB;  Service: Cardiovascular;  Laterality: Right;   SPHINCTEROTOMY  08/21/2020   Procedure: SPHINCTEROTOMY;  Surgeon: Jeani Hawking, MD;  Location: [facility] ENDOSCOPY;  Service: Endoscopy;;   TEE WITHOUT CARDIOVERSION N/A 04/08/2017   Procedure: TRANSESOPHAGEAL ECHOCARDIOGRAM (TEE);  Surgeon: Alleen Borne, MD;  Location: [facility] OR;  Service: Open Heart Surgery;  Laterality: N/A;   TRANSURETHRAL RESECTION OF BLADDER TUMOR  05/29/2011   Procedure: TRANSURETHRAL RESECTION OF BLADDER TUMOR (TURBT);  Surgeon: Antony Haste, MD;  Location: [facility];  Service: Urology;  Laterality: N/A;   VENTRICULAR ASSIST DEVICE INSERTION Right 03/31/2017   Procedure: VENTRICULAR ASSIST DEVICE INSERTION;  Surgeon: Marykay Lex, MD;  Location: [facility] INVASIVE CV LAB;  Service: Cardiovascular;  Laterality: Right;    Current Facility-Administered Medications  Medication Dose Route Frequency Provider Last Rate Last Admin   0.9 %  sodium chloride infusion   Intravenous Continuous Duke Salvia, MD 50 mL/hr at 01/04/23 0605 New Bag at 01/04/23 0605   0.9 %  sodium chloride infusion   Intravenous Continuous Duke Salvia, MD 50 mL/hr at 01/04/23 0555 New Bag at 01/04/23 0555   ceFAZolin (ANCEF) 2-4 GM/100ML-% IVPB            ceFAZolin (ANCEF) IVPB 2g/100 mL premix  2 g Intravenous On Call Duke Salvia, MD       chlorhexidine (HIBICLENS) 4 % liquid 4 Application  4 Application Topical Once Duke Salvia, MD       gentamicin (GARAMYCIN) 80 mg in sodium chloride 0.9 % 500 mL irrigation  80 mg Irrigation On Call Duke Salvia, MD       sodium chloride 0.9 % with gentamicin (GARAMYCIN) ADS Med             Allergies  Allergen Reactions   Spironolactone Other (See Comments)    Painful  gynecomastia   Empagliflozin Rash    Rash at the groin Jardiance   Niacin And Related Hives      Social History   Tobacco Use   Smoking status: Former    Current packs/day: 0.00    Average packs/day: 1 pack/day for 40.0 years (40.0 ttl pk-yrs)    Types: Cigarettes    Start date: 03/31/1977    Quit date: 03/31/2017    Years since quitting: 5.7   Smokeless tobacco: Never  Vaping Use   Vaping status: Never Used  Substance Use Topics   Alcohol use: Not Currently    Comment: rarely   Drug use: No     Family History  Problem Relation  Age of Onset   Heart attack Sister    Heart disease Sister      Current Meds  Medication Sig   amiodarone (PACERONE) 200 MG tablet Take 1 tablet (200 mg total) by mouth 2 (two) times daily.   amLODipine (NORVASC) 5 MG tablet Take 1 tablet (5 mg total) by mouth daily.   atorvastatin (LIPITOR) 80 MG tablet TAKE 1 TABLET BY MOUTH EVERY DAY   ELIQUIS 5 MG TABS tablet TAKE 1 TABLET TWICE A DAY   ENTRESTO 97-103 MG TAKE 1 TABLET TWICE A DAY   isosorbide-hydrALAZINE (BIDIL) 20-37.5 MG tablet Take 0.5 tablets by mouth 3 (three) times daily.   metFORMIN (GLUCOPHAGE-XR) 500 MG 24 hr tablet Take 1 tablet (500 mg total) by mouth daily with breakfast.   oxymetazoline (DRISTAN) 0.05 % nasal spray Place 1 spray into both nostrils at bedtime.   torsemide (DEMADEX) 20 MG tablet Take 1 tablet (20 mg total) by mouth daily.   zolpidem (AMBIEN) 10 MG tablet Take 5 mg by mouth at bedtime.     Review of Systems negative except from HPI and PMH  Physical Exam BP (!) 143/82   Pulse 98   Temp 98.7 F (37.1 C) (Temporal)   Resp 18   Ht 6\' 2"  (1.88 m)   Wt 96.2 kg   SpO2 94%   BMI 27.22 kg/m  Well developed and well nourished in no acute distress HENT normal E scleral and icterus clear Neck Supple JVP flat; carotids brisk and full Clear to ausculation Regular rate and rhythm, no murmurs gallops or rub Soft with active bowel sounds No clubbing cyanosis   Edema Alert and oriented, grossly normal motor and sensory function Skin Warm and Dry    Assessment and  Plan  Ischemic cardiomyopathy status post CABG   Congestive heart failure-class II-III   Implantable defibrillator-single-chamber-Medtronic   Atrial fibrillation-persistent-recurrent   Laminated clot (still present MRI)   Sinus bradycardia and chronotropic incompetence  Anemia ( relative )   Renal insufficiency chronic   (class 3b)       For CCM today; CPX also notable for chronotropic incompetence but will anticipate procedures in sequence, CCm today and if chronotropic incompetence remains limiting ( is his heart failure overwhelming prior to achieving a HR response) will upgrade ICD-- do not want to potentially puncture bilaterally, limit arms bilaterally.    The benefits and risks were reviewed including but not limited to death,  perforation, infection, lead dislodgement and device malfunction.  The patient understands agrees and is willing to proceed.  Will check Fe Labs and anticipate repeat CBC at wound check

## 2023-01-07 ENCOUNTER — Encounter (HOSPITAL_COMMUNITY): Payer: Self-pay | Admitting: Internal Medicine

## 2023-01-07 NOTE — Progress Notes (Signed)
Advanced Heart Failure Clinic Note   PCP: [name], MD HF Cardiologist: Dr. Gala Romney   HPI: [name] [name] is a 77 y.o. 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   Pt admitted in 12/18 with anterior STEMI and developed cardiogenic shock requiring Impella support. Cath showed severe 3v CAD. While on impella, pt developed sepsis with WBC ~ 40K, UCx + pseudomonas. Stabilized and underwent CABG x 2 with LIMA to LAD and SVG to OM on 04/08/2017. Pt transiently required milrinone but weaned prior to discharge. Required amiodarone for brief post op afib, but had no recurrence.   Echo 08/07/17 EF 35-40% laminated apical clot. (Dr.Wilmetta Speiser felt ~ 25-30%) and moderate to severe MR.   cMRI 6/19 EF 30%, laminated apical thrombus, normal RV size and function, LGE pattern suggests infarction in LAD territory, unlikely to improve with revascularization  S/p Medtronic ICD with Dr Graciela Husbands 11/20/17.  Saw Dr. Graciela Husbands on 01/07/20 and was in AF with RVR. Potassium also high so Inspra stopped. Dr. Odessa Fleming note also mentions chronotropic incompetence with exercise. Carvedilol cut back to 3.125 bid.   Echo 07/22 EF 20-25% , RV mildly reduced, no LV clot.  Admitted 4/23 with AFL with RVR. Underwent DCCV to NSR. Presented back to ED next day with a/c CHF. Post hospital follow up, he was volume overloaded and back in AFL RVR. Amiodarone started and arranged for DCCV.  S/p DCCV 08/10/21 to NSR.  Echo 9/23 EF 20-25%, RV mildly reduced, moderate MR, mild to moderate AI  Echo 05/24: EF 25-30%, RV mildly reduced  Has recently been struggling with recurrent AFL and volume overload.  He underwent DCCV to SR on 07/12.   CCM implant currently scheduled for September.   Last seen in HF clinic 07/09. He was volume overloaded. Furosemide switched  to 20 mg Torsemide daily w/ improvement in volume status. Also seen in EP clinic by Dr. Graciela Husbands and decided to undergo CCM implant in September.   Underwent CPX 11/26/32 which demonstrated severe HF functional limitation w/ severely reduced peak VO2 of 10.0 ml/kg/min and severely elevated VE/VCO2 slope of 46. The RER of 1.09 indicates a near maximal effort.   Underwent CCM implant with Dr. Graciela Husbands on 01/04/23  Here for f/u. Feels a bit better after CCM implant. A bit more energy. No device complications. Able to do all ADLs ok. [place] [employer] but takes his time. No edema, orthopnea or PND. Having some dizziness.    Cardiac Studies:  CPX 7/24:  Conclusion: Exercise testing with gas exchange demonstrates a severe functional limitation when compared to matched sedentary norms. There is a severe HF limitation with multiple high-risk features (severely elevated VE/VCO2 slope, blunted O2 response). EKG with paroxysmal atrial flutter and sinus breaks with 1st degree AV block noted.   Severe HF limitation with severe chronotropic incompetence and blunted BP response to exercise.   - Echo (9/23): EF 20-25%, RV mildly reduced, moderate MR, mild to moderate AI  - Echo (11/16/20): EF 20-25% RV mildly reduced No LV clot   - Echo (8/20): EF 25-30% RV ok. Small apical thrombus.  - L/RHC 03/31/2017 Prox RCA lesion is 100% stenosed. Dist RCA lesion is 100% stenosed (retrograde filling from collaterals does not go beyond the distal vessel) -->faint collateral filling to the distal PL  and PDA from septal perforators and a diffusely diseased AV groove circumflex LAV Groove lesion is 90% stenosed. Prox LAD-1 lesion is 95% stenosed. Prox LAD-2 lesion is 85% stenosed. Heavily calcified, tandem lesions in very tortuous segment of the vessel. Prox Cx to Mid Cx lesion is 70% stenosed. Heavily calcified There is severe left ventricular systolic dysfunction. The left ventricular ejection fraction is less than 25%  by visual estimate. LV end diastolic pressure is severely elevated -prior to Impella insertion Hemodynamic findings consistent with moderate pulmonary hypertension. Successful Impella insertion with 3.5-3.6 LPM flow's  - Echo 04/01/2017 EF 15-20%   - Echo 04/16/17 (post op) Improved to 25-30% on 04/16/17  - Echo 08/07/17: EF 35-40%, mod to severe MR   Review of systems complete and found to be negative unless listed in HPI.   Past Medical History:  Diagnosis Date   AICD (automatic cardioverter/defibrillator) present    Arthritis    Bladder cancer (HCC)    Borderline glaucoma    Chronic systolic (congestive) heart failure (HCC)    Coronary artery disease    Diabetes mellitus type 2, controlled (HCC) ORAL MED   Essential hypertension    Heart murmur    Hepatitis    Medical history non-contributory    Paroxysmal A-fib (HCC)    Paroxysmal A-fib (HCC) 08/20/2020   Peripheral vascular disease (HCC)    Pneumonia    Current Outpatient Medications  Medication Sig Dispense Refill   amiodarone (PACERONE) 200 MG tablet Take 1 tablet (200 mg total) by mouth 2 (two) times daily. 180 tablet 3   amLODipine (NORVASC) 5 MG tablet Take 1 tablet (5 mg total) by mouth daily. 90 tablet 3   atorvastatin (LIPITOR) 80 MG tablet TAKE 1 TABLET BY MOUTH EVERY DAY 90 tablet 3   ELIQUIS 5 MG TABS tablet TAKE 1 TABLET TWICE A DAY 180 tablet 3   ENTRESTO 97-103 MG TAKE 1 TABLET TWICE A DAY 180 tablet 3   isosorbide-hydrALAZINE (BIDIL) 20-37.5 MG tablet Take 0.5 tablets by mouth 3 (three) times daily. 100 tablet 3   oxymetazoline (DRISTAN) 0.05 % nasal spray Place 1 spray into both nostrils at bedtime.     OZEMPIC, 0.25 OR 0.5 MG/DOSE, 2 MG/3ML SOPN Inject into the skin.     torsemide (DEMADEX) 20 MG tablet Take 1 tablet (20 mg total) by mouth daily. 90 tablet 3   zolpidem (AMBIEN) 10 MG tablet Take 5 mg by mouth at bedtime.     No current facility-administered medications for this encounter.   Allergies   Allergen Reactions   Spironolactone Other (See Comments)    Painful gynecomastia   Empagliflozin Rash    Rash at the groin Jardiance   Niacin And Related Hives   Social History   Socioeconomic History   Marital status: Married    Spouse name: Not on file   Number of children: Not on file   Years of education: Not on file   Highest education level: Not on file  Occupational History   Occupation: retired  Tobacco Use   Smoking status: Former    Current packs/day: 0.00    Average packs/day: 1 pack/day for 40.0 years (40.0 ttl pk-yrs)    Types: Cigarettes    Start date: 03/31/1977    Quit date: 03/31/2017    Years since quitting: 5.7   Smokeless tobacco: Never  Vaping Use   Vaping status: Never Used  Substance and Sexual Activity   Alcohol use: Not Currently  Comment: rarely   Drug use: No   Sexual activity: Not on file  Other Topics Concern   Not on file  Social History Narrative   Not on file   Social Determinants of Health   Financial Resource Strain: Not on file  Food Insecurity: Low Risk  (12/12/2022)   Received from [facility]   Hunger Vital Sign    Within the past 12 months, you worried that your food would run out before you got money to buy more: Not on file    Ran Out of Food in the Last Year: Never true  Transportation Needs: Not on file (12/12/2022)  Physical Activity: Inactive (07/11/2017)   Exercise Vital Sign    Days of Exercise per Week: 0 days    Minutes of Exercise per Session: 0 min  Stress: No Stress Concern Present (07/11/2017)   [employer] of Occupational Health - Occupational Stress Questionnaire    Feeling of Stress : Only a little  Social Connections: Not on file  Intimate Partner Violence: Not on file   Family History  Problem Relation Age of Onset   Heart attack Sister    Heart disease Sister    BP 100/60   Pulse 99   Wt 96.5 kg (212 lb 12.8 oz)   SpO2 95%   BMI 27.32 kg/m   Wt Readings from Last 3 Encounters:   01/08/23 96.5 kg (212 lb 12.8 oz)  01/04/23 96.2 kg (212 lb)  12/11/22 96.3 kg (212 lb 3.2 oz)    PHYSICAL EXAM: General:  Elderly No resp difficulty HEENT: normal Neck: supple. no JVD. Carotids 2+ bilat; no bruits. No lymphadenopathy or thryomegaly appreciated. Cor: CCM site with ecchymosis. Minimal hematoma. Regular rate & rhythm. No rubs, gallops or murmurs. Lungs: clear Abdomen: soft, nontender, nondistended. No hepatosplenomegaly. No bruits or masses. Good bowel sounds. Extremities: no cyanosis, clubbing, rash, edema Neuro: alert & orientedx3, cranial nerves grossly intact. moves all 4 extremities w/o difficulty. Affect pleasant   Device interrogation (personally reviewed): Fluid mildly elevated. HR has been up around 100 since 12/22/22. No VT Personally reviewed   ASSESSMENT & PLAN:   1. Chronic Systolic CHF due to ICM  - EF 32/4/40 EF 15-20% -> Improved to 25-30% on 04/16/17 Post op CABG as below.  - Echo 08/07/17 read as EF 35-40% but Dr Gala Romney reviewed personally and EF 25-30% (reviewed with Dr. Delton See who agrees) + laminated apical clot and moderate to severe MR. RV ok.  - Cardiac MRI 09/2017 EF 30%, laminated apical thrombus, normal RV size and function, LGE pattern suggests infarction in LAD territory, unlikely to improve with revascularization - S/p Medtronic ICD with Dr Graciela Husbands 11/20/17 - Echo (8/20): EF 25-30% RV ok. Mild MR. Small apical thrombus.  - Echo (11/16/20): EF 20-25% RV mildly reduced No LV clot  - Echo 09/26/22, 25-30%. RV mildly reduced  - CPX 11/26/32 pVO2 10.0, VE/VCO2 46. RER 1.09 indicates a near maximal effort. We discussed possible LVAD, however pt was not interested  - s/p CCM 01/04/23 with Dr. Graciela Husbands  - NYHA IIIB-IV - await to see if functional improvement with CCM - Decrease Entresto 49/51 mg bid - Off Farxiga/Jardiance with yeast infections. - Off eplerenone due hyperkalemia (painful gynecomastia with spiro). - Off Coreg with bradycardia.  - Stop  bidil due to low BP  - Labs today  2. Persistent Atrial fibrillation/AFL RVR - AFL with RVR 4/23, s/p DCCV -->NSR - s/p DCCV 08/14/21-->NSR - s/p DCCV 11/06/22 --> NSR -  Back in AFL over las 1-2 months. Was off Eliquis for CCM.  - We discussed TEE/DC-CV or waiting 4 weeks for DC-CV alone - He wants to discuss ablation again with Dr. Graciela Husbands next week first - Increase amio to 200 bid - Continue Eliquis.   - Watch for bradycardia post DC-CV. May need atrial wire if can maintain sinus  3. Mitral regurgitation - functional MR  - Moderate to severe on echo 07/2017 - Trivial on echo 5/24   4. CAD - s/p CABG with LIMA to LAD and SVG to OM on 04/08/2017 - denies CP  - Continue statin. - off ? blocker w/ bradycardia  - No ASA with Eliquis use.   5. Tobacco abuse  - Smoked 1 ppd PTA for > 50 years.  - He has quit - No change.    6. Hx of bladder CA  - s/p multiple rounds of BCG therapy.    7. LV clot - Laminated, small. - On Eliquis for PAF.  - No clot on recent echo.    8. DM2 - On metformin. - Off SGLT2i with yeast infections.   9. CKD 3b - Baseline SCr ~2.0  - Check labs today  10. PAD - Complaining of bilateral thigh and calf pain with ambulation. Unable to walk more than 200 feet before he must stop to rest.  - No ischemic rest pain or wounds - ABIs 01/21: R ABI 0.59 and L ABI 0.63. Monophasic doppler waveforms of common femoral arteries c/w aorto-iliac inflow obstruction/stenosis - Discussed repeating ABIs/arterial dopplers and possible referral to our interventional colleagues. He would prefer to wait until after he completes further cardiac testing before proceeding. - Continue statin  Total time spent 40 minutes. Over half that time spent discussing above.    Arvilla Meres, MD 01/08/23

## 2023-01-08 ENCOUNTER — Ambulatory Visit (HOSPITAL_COMMUNITY)
Admission: RE | Admit: 2023-01-08 | Discharge: 2023-01-08 | Disposition: A | Discharge status: Home / Self Care | Payer: Medicare Other | Source: Ambulatory Visit | Attending: Internal Medicine | Admitting: Internal Medicine

## 2023-01-08 ENCOUNTER — Encounter (HOSPITAL_COMMUNITY): Payer: Self-pay | Admitting: Internal Medicine

## 2023-01-08 VITALS — BP 100/60 | HR 99 | Wt 212.8 lb

## 2023-01-08 DIAGNOSIS — E875 Hyperkalemia: Secondary | ICD-10-CM | POA: Diagnosis not present

## 2023-01-08 DIAGNOSIS — I5022 Chronic systolic (congestive) heart failure: Secondary | ICD-10-CM | POA: Insufficient documentation

## 2023-01-08 DIAGNOSIS — I340 Nonrheumatic mitral (valve) insufficiency: Secondary | ICD-10-CM | POA: Diagnosis not present

## 2023-01-08 DIAGNOSIS — N1832 Chronic kidney disease, stage 3b: Secondary | ICD-10-CM | POA: Diagnosis not present

## 2023-01-08 DIAGNOSIS — M79651 Pain in right thigh: Secondary | ICD-10-CM | POA: Insufficient documentation

## 2023-01-08 DIAGNOSIS — E1122 Type 2 diabetes mellitus with diabetic chronic kidney disease: Secondary | ICD-10-CM | POA: Diagnosis not present

## 2023-01-08 DIAGNOSIS — I480 Paroxysmal atrial fibrillation: Secondary | ICD-10-CM | POA: Diagnosis not present

## 2023-01-08 DIAGNOSIS — N62 Hypertrophy of breast: Secondary | ICD-10-CM | POA: Diagnosis not present

## 2023-01-08 DIAGNOSIS — Z87891 Personal history of nicotine dependence: Secondary | ICD-10-CM | POA: Insufficient documentation

## 2023-01-08 DIAGNOSIS — Z951 Presence of aortocoronary bypass graft: Secondary | ICD-10-CM | POA: Insufficient documentation

## 2023-01-08 DIAGNOSIS — M79652 Pain in left thigh: Secondary | ICD-10-CM | POA: Diagnosis not present

## 2023-01-08 DIAGNOSIS — I4819 Other persistent atrial fibrillation: Secondary | ICD-10-CM | POA: Diagnosis not present

## 2023-01-08 DIAGNOSIS — I513 Intracardiac thrombosis, not elsewhere classified: Secondary | ICD-10-CM | POA: Insufficient documentation

## 2023-01-08 DIAGNOSIS — I2510 Atherosclerotic heart disease of native coronary artery without angina pectoris: Secondary | ICD-10-CM | POA: Insufficient documentation

## 2023-01-08 DIAGNOSIS — I130 Hypertensive heart and chronic kidney disease with heart failure and stage 1 through stage 4 chronic kidney disease, or unspecified chronic kidney disease: Secondary | ICD-10-CM | POA: Diagnosis not present

## 2023-01-08 DIAGNOSIS — E1151 Type 2 diabetes mellitus with diabetic peripheral angiopathy without gangrene: Secondary | ICD-10-CM | POA: Diagnosis not present

## 2023-01-08 DIAGNOSIS — I483 Typical atrial flutter: Secondary | ICD-10-CM

## 2023-01-08 DIAGNOSIS — M79662 Pain in left lower leg: Secondary | ICD-10-CM | POA: Diagnosis not present

## 2023-01-08 DIAGNOSIS — Z79899 Other long term (current) drug therapy: Secondary | ICD-10-CM | POA: Diagnosis not present

## 2023-01-08 DIAGNOSIS — I4892 Unspecified atrial flutter: Secondary | ICD-10-CM | POA: Insufficient documentation

## 2023-01-08 DIAGNOSIS — M79661 Pain in right lower leg: Secondary | ICD-10-CM | POA: Diagnosis not present

## 2023-01-08 DIAGNOSIS — Z8551 Personal history of malignant neoplasm of bladder: Secondary | ICD-10-CM | POA: Diagnosis not present

## 2023-01-08 MED ORDER — ENTRESTO 49-51 MG PO TABS: 1.0000 | ORAL_TABLET | Freq: Two times a day (BID) | ORAL | 3 refills | Status: DC

## 2023-01-08 MED FILL — Midazolam HCl Inj 2 MG/2ML (Base Equivalent): INTRAMUSCULAR | Qty: 3 | Status: AC

## 2023-01-08 NOTE — Patient Instructions (Signed)
Medication Changes:  STOP Bidil  Decrease Entresto to 49/51 mg Twice daily   Increase Amiodarone to 200 mg Twice daily   Lab Work:  none  Testing/Procedures:  none  Referrals:  none  Special Instructions // Education:  Do the following things EVERYDAY: Weigh yourself in the morning before breakfast. Write it down and keep it in a log. Take your medicines as prescribed Eat low salt foods--Limit salt (sodium) to 2000 mg per day.  Stay as active as you can everyday Limit all fluids for the day to less than 2 liters   Follow-Up in: 3 months   At the [facility] Failure [facility], you and your health needs are our priority. We have a designated team specialized in the treatment of Heart Failure. This Care Team includes your primary Heart Failure Specialized Cardiologist (physician), Advanced Practice Providers (APPs- Physician Assistants and Nurse Practitioners), and Pharmacist who all work together to provide you with the care you need, when you need it.   You may see any of the following providers on your designated Care Team at your next follow up:  Dr. Arvilla Meres Dr. Marca Ancona Dr. Marcos Eke, NP Robbie Lis, [place] [facility] [place], [place] Brynda Peon, NP Karle Plumber, PharmD   Please be sure to bring in all your medications bottles to every appointment.   Need to Contact us:  If you have any questions or concerns before your next appointment please send [place] a message through Abanda or call our office at [phone].    TO LEAVE A MESSAGE FOR THE NURSE SELECT OPTION 2, PLEASE LEAVE A MESSAGE INCLUDING: YOUR NAME DATE OF BIRTH CALL BACK NUMBER REASON FOR CALL**this is important as we prioritize the call backs  YOU WILL RECEIVE A CALL BACK THE SAME DAY AS LONG AS YOU CALL BEFORE 4:00 PM

## 2023-01-14 ENCOUNTER — Ambulatory Visit: Payer: Medicare Other | Attending: Internal Medicine

## 2023-01-14 DIAGNOSIS — I5042 Chronic combined systolic (congestive) and diastolic (congestive) heart failure: Secondary | ICD-10-CM

## 2023-01-14 DIAGNOSIS — Z95810 Presence of automatic (implantable) cardiac defibrillator: Secondary | ICD-10-CM

## 2023-01-16 ENCOUNTER — Emergency Department (HOSPITAL_BASED_OUTPATIENT_CLINIC_OR_DEPARTMENT_OTHER)
Admission: EM | Admit: 2023-01-16 | Discharge: 2023-01-16 | Disposition: A | Discharge status: Home / Self Care | Payer: Medicare Other

## 2023-01-16 ENCOUNTER — Ambulatory Visit: Payer: Medicare Other

## 2023-01-16 ENCOUNTER — Telehealth: Payer: Self-pay

## 2023-01-16 ENCOUNTER — Other Ambulatory Visit: Payer: Self-pay

## 2023-01-16 ENCOUNTER — Encounter (HOSPITAL_BASED_OUTPATIENT_CLINIC_OR_DEPARTMENT_OTHER): Payer: Self-pay

## 2023-01-16 ENCOUNTER — Emergency Department (HOSPITAL_BASED_OUTPATIENT_CLINIC_OR_DEPARTMENT_OTHER): Payer: Medicare Other

## 2023-01-16 DIAGNOSIS — Z5321 Procedure and treatment not carried out due to patient leaving prior to being seen by health care provider: Secondary | ICD-10-CM | POA: Diagnosis not present

## 2023-01-16 DIAGNOSIS — J449 Chronic obstructive pulmonary disease, unspecified: Secondary | ICD-10-CM | POA: Insufficient documentation

## 2023-01-16 DIAGNOSIS — R0602 Shortness of breath: Secondary | ICD-10-CM | POA: Diagnosis present

## 2023-01-16 DIAGNOSIS — R790 Abnormal level of blood mineral: Secondary | ICD-10-CM | POA: Diagnosis not present

## 2023-01-16 DIAGNOSIS — I509 Heart failure, unspecified: Secondary | ICD-10-CM | POA: Insufficient documentation

## 2023-01-16 DIAGNOSIS — Z79899 Other long term (current) drug therapy: Secondary | ICD-10-CM | POA: Diagnosis not present

## 2023-01-16 DIAGNOSIS — Z7901 Long term (current) use of anticoagulants: Secondary | ICD-10-CM | POA: Diagnosis not present

## 2023-01-16 LAB — BASIC METABOLIC PANEL WITH GFR
Anion gap: 10 (ref 5–15)
BUN: 29 mg/dL — ABNORMAL HIGH (ref 8–23)
CO2: 24 mmol/L (ref 22–32)
Calcium: 8.8 mg/dL — ABNORMAL LOW (ref 8.9–10.3)
Chloride: 104 mmol/L (ref 98–111)
Creatinine, Ser: 2.18 mg/dL — ABNORMAL HIGH (ref 0.61–1.24)
GFR, Estimated: 30 mL/min — ABNORMAL LOW (ref >60)
Glucose, Bld: 136 mg/dL — ABNORMAL HIGH (ref 70–99)
Potassium: 4.2 mmol/L (ref 3.5–5.1)
Sodium: 138 mmol/L (ref 135–145)

## 2023-01-16 LAB — TROPONIN I (HIGH SENSITIVITY)
Troponin I (High Sensitivity): 20 ng/L — ABNORMAL HIGH (ref <18)
Troponin I (High Sensitivity): 18 ng/L — ABNORMAL HIGH (ref <18)

## 2023-01-16 LAB — CBC
HCT: 40.6 % (ref 39.0–52.0)
Hemoglobin: 13.1 g/dL (ref 13.0–17.0)
MCH: 28.7 pg (ref 26.0–34.0)
MCHC: 32.3 g/dL (ref 30.0–36.0)
MCV: 89 fL (ref 80.0–100.0)
Platelets: 358 10*3/uL (ref 150–400)
RBC: 4.56 MIL/uL (ref 4.22–5.81)
RDW: 15.2 % (ref 11.5–15.5)
WBC: 9.1 10*3/uL (ref 4.0–10.5)
nRBC: 0 % (ref 0.0–0.2)

## 2023-01-16 LAB — BRAIN NATRIURETIC PEPTIDE: B Natriuretic Peptide: 620.1 pg/mL — ABNORMAL HIGH (ref 0.0–100.0)

## 2023-01-16 MED ORDER — FUROSEMIDE 10 MG/ML IJ SOLN
40.0000 mg | Freq: Once | INTRAMUSCULAR | Status: AC
  Administered 2023-01-16: 40 mg via INTRAVENOUS
  Filled 2023-01-16: qty 4

## 2023-01-16 NOTE — Discharge Instructions (Signed)
Please follow-up with your cardiologist tomorrow as scheduled.  Return to the ER for worsening symptoms.

## 2023-01-16 NOTE — Progress Notes (Signed)
EPIC Encounter for ICM Monitoring  Patient Name: [name] [name] is a 77 y.o. male Date: 01/16/2023 Primary Care Physican: [name], MD Primary Cardiologist: Ben[place]hon Electrophysiologist: Graciela Husbands 01/08/2023 Office Weight: 212 lbs        1st ICM Remote Transmission.  Pt currently in ED due to SOB.      Optivol thoracic impedance suggesting possible fluid accumulation starting 8/26 with exception of 2 days at baseline.   CCM Implant 9/6.  Prescribed:  Furosemide 20 mg take 1 tablet(s) (20 mg total) by mouth daily.  Labs: 01/16/2023 Creatinine 2.18, BUN 29, Potassium 4.2, Sodium 138, GFR 30  12/11/2022 Creatinine 2.02, BUN 26, Potassium 4.3, Sodium 138, GFR 33  11/27/2022 Creatinine 2.12, BUN 31, Potassium 4.3, Sodium 135, GFR 31 11/13/2022 Creatinine 2.60, BUN 39, Potassium 4.6, Sodium 138, GFR 25 A complete set of results can be found in Results Review.  Recommendations: Currently in ED.  Follow-up plan: ICM clinic phone appointment on 01/21/2023 to recheck fluid levels.   91 day device clinic remote transmission 03/05/2023.    EP/Cardiology Office Visits: 03/20/2023 with Dr. Graciela Husbands.  04/19/2023 Dr Gala Romney 3 month f/u.    Copy of ICM check sent to Dr. Graciela Husbands.   3 month ICM trend: 01/14/2023.    12-14 Month ICM trend:     [name], RN 01/16/2023 12:02 PM

## 2023-01-16 NOTE — ED Triage Notes (Signed)
Gradually become short of breath over several days. States last night became worse to point unable to lay down.  Productive cough.

## 2023-01-16 NOTE — Progress Notes (Signed)
RT ambulated the Pt on RA. His O@ was 90-93%

## 2023-01-16 NOTE — ED Provider Notes (Signed)
EMERGENCY DEPARTMENT AT [facility] Provider Note   CSN: [id number] Arrival date & time: 01/16/23  9562     History  Chief Complaint  Patient presents with   Shortness of Breath    [name] [name] is a 77 y.o. male.  77 year old male with past medical history of CHF and COPD presenting to the emergency department today with shortness of breath.  The patient states this been going now for the past few days.  He did have a CCM implanted 10 days ago.  He states he was initially feeling well for the first 3 to 4 days.  He states that this gradually worsened over the past few days.  He denies any associated chest pain.  He states that he is more short of breath when he lays flat.  He called his cardiologist (Dr. Graciela Husbands) who recommended he come to the ER for further evaluation.  The patient has been taking his Lasix as prescribed.  He denies any significant leg swelling.  He denies any fevers, chills, or cough.   Shortness of Breath      Home Medications Prior to Admission medications   Medication Sig Start Date End Date Taking? Authorizing Provider  amiodarone (PACERONE) 200 MG tablet Take 1 tablet (200 mg total) by mouth 2 (two) times daily. 11/13/22   Andrey Farmer, PA-C  amLODipine (NORVASC) 5 MG tablet Take 1 tablet (5 mg total) by mouth daily. 10/08/22 01/08/24  Bensimhon, Bevelyn Buckles, MD  atorvastatin (LIPITOR) 80 MG tablet TAKE 1 TABLET BY MOUTH EVERY DAY 07/27/22   Bensimhon, Bevelyn Buckles, MD  ELIQUIS 5 MG TABS tablet TAKE 1 TABLET TWICE A DAY 10/03/22   Bensimhon, Bevelyn Buckles, MD  oxymetazoline (DRISTAN) 0.05 % nasal spray Place 1 spray into both nostrils at bedtime.    [provider]  OZEMPIC, 0.25 OR 0.5 MG/DOSE, 2 MG/3ML SOPN Inject into the skin. 12/21/22   [provider]  sacubitril-valsartan (ENTRESTO) 49-51 MG Take 1 tablet by mouth 2 (two) times daily. 01/08/23   Bensimhon, Bevelyn Buckles, MD  torsemide (DEMADEX) 20 MG tablet Take 1 tablet (20 mg  total) by mouth daily. 11/21/22   Andrey Farmer, PA-C  zolpidem (AMBIEN) 10 MG tablet Take 5 mg by mouth at bedtime. 12/13/22   [provider]      Allergies    Spironolactone, Empagliflozin, and Niacin and related    Review of Systems   Review of Systems  Respiratory:  Positive for shortness of breath.   All other systems reviewed and are negative.   Physical Exam Updated Vital Signs BP 135/85   Pulse 96   Temp 97.9 F (36.6 C) (Oral)   Resp 11   Ht 6\' 2"  (1.88 m)   Wt 95.3 kg   SpO2 91%   BMI 26.96 kg/m  Physical Exam Vitals and nursing note reviewed.   Gen: NAD Eyes: PERRL, EOMI HEENT: no oropharyngeal swelling Neck: trachea midline Resp: Equal bilateral breath sounds noted with Rales on the left greater than right Card: RRR, no murmurs, rubs, or gallops Abd: nontender, nondistended Extremities: no calf tenderness, no edema Vascular: 2+ radial pulses bilaterally, 2+ DP pulses bilaterally Skin: no rashes Psyc: acting appropriately   ED Results / Procedures / Treatments   Labs (all labs ordered are listed, but only abnormal results are displayed) Labs Reviewed  BASIC METABOLIC PANEL - Abnormal; Notable for the following components:      Result Value   Glucose, Bld  136 (*)    BUN 29 (*)    Creatinine, Ser 2.18 (*)    Calcium 8.8 (*)    GFR, Estimated 30 (*)    All other components within normal limits  BRAIN NATRIURETIC PEPTIDE - Abnormal; Notable for the following components:   B Natriuretic Peptide 620.1 (*)    All other components within normal limits  TROPONIN I (HIGH SENSITIVITY) - Abnormal; Notable for the following components:   Troponin I (High Sensitivity) 20 (*)    All other components within normal limits  TROPONIN I (HIGH SENSITIVITY) - Abnormal; Notable for the following components:   Troponin I (High Sensitivity) 18 (*)    All other components within normal limits  CBC    EKG None  Radiology DG Chest Port 1  View  Result Date: 01/16/2023 CLINICAL DATA:  Shortness of breath.  Productive cough. EXAM: PORTABLE CHEST 1 VIEW COMPARISON:  01/04/2023. FINDINGS: Bilateral lung fields are clear. Bilateral costophrenic angles are clear. Stable mildly enlarged cardio-mediastinal silhouette. There is a right sided 2-lead pacemaker. There is also left-sided ICD. There are surgical staples along the heart border and sternotomy wires, status post CABG (coronary artery bypass graft). Aortic arch calcifications noted. No acute osseous abnormalities. The soft tissues are within normal limits. IMPRESSION: 1. Mild cardiomegaly. No active disease. Electronically Signed   By: Jules Schick M.D.   On: 01/16/2023 10:16    Procedures Procedures    Medications Ordered in ED Medications  furosemide (LASIX) injection 40 mg (40 mg Intravenous Given 01/16/23 1130)    ED Course/ Medical Decision Making/ A&P                                 Medical Decision Making 77 year old male with past medical history of CHF and COPD presenting to the emergency department today with dyspnea on exertion as well as orthopnea.  I will further evaluate patient here with basic labs as well as an EKG, chest x-ray, troponin, and BNP for further evaluation for ACS, pulmonary edema, pulmonary infiltrates, or pneumothorax given his recent procedure.  He did not have general anesthesia and is on Eliquis and does have some extraneous breath sounds here so suspicion at this point is relatively low for pulmonary embolism.  Patient is not hypoxic.  I will reevaluate for ultimate disposition.  The patient's EKG interpreted by me shows a sinus rhythm with right bundle branch block, normal axis, normal intervals with exception of QTc of 534, nonspecific ST-T changes.  The patient's x-ray does show some vascular congestion and BNP is mildly elevated.  2 troponins are flat.  The patient's ambulatory pulse ox stayed in the low 90s.  He was actually about to sign  out AGAINST MEDICAL ADVICE before I was able to go back and go over his results.  He is requesting discharge.  I do think this is reasonable and I suspect this likely due to mild CHF exacerbation.  He is given dose of Lasix here.  He is discharged with return precautions and has an appoint with his cardiologist tomorrow.  Amount and/or Complexity of Data Reviewed Labs: ordered. Radiology: ordered.  Risk Prescription drug management.           Final Clinical Impression(s) / ED Diagnoses Final diagnoses:  Congestive heart failure, unspecified HF chronicity, unspecified heart failure type (HCC)    Rx / DC Orders ED Discharge Orders     None  Durwin Glaze, MD 01/16/23 1328

## 2023-01-16 NOTE — ED Notes (Signed)
ED Provider at bedside. 

## 2023-01-16 NOTE — ED Notes (Signed)
Respiratory therapy reported that ambulation with pulse ox showed that pt stayed at 91-93% during ambulation.

## 2023-01-16 NOTE — Telephone Encounter (Signed)
Outreach made to Pt to reschedule CCM appt.  Barbara Cower with CCM unavailable today, can see Pt 01/17/2023 at 1:00 pm.  Spoke with Pt.  Per Pt he is having trouble breathing.  Started a few days ago, became much worse last night.    Advised Pt to go to ER for evaluation.  Advised CCM appt rescheduled for 01/17/2023 at 1:00 pm.  Pt states he will go to [facility] ER for evaluation.

## 2023-01-16 NOTE — ED Notes (Signed)
Pt aware to follow up with cardiologist and to return if having any worsening symptoms.

## 2023-01-17 ENCOUNTER — Encounter: Payer: Self-pay | Admitting: Internal Medicine

## 2023-01-17 ENCOUNTER — Ambulatory Visit: Payer: Medicare Other | Attending: Cardiovascular Disease

## 2023-01-17 DIAGNOSIS — I5042 Chronic combined systolic (congestive) and diastolic (congestive) heart failure: Secondary | ICD-10-CM

## 2023-01-17 LAB — CUP PACEART INCLINIC DEVICE CHECK
Date Time Interrogation Session: 20240919132656
Implantable Lead Connection Status: 753985
Implantable Lead Implant Date: 20190724
Implantable Lead Location: 753860
Implantable Pulse Generator Implant Date: 20190724

## 2023-01-17 NOTE — Progress Notes (Signed)
Pt seen in device clinic for wound check post CCM implant.  Wound check appointment. Steri-strips removed. Wound without redness or edema. Incision edges approximated, wound well healed.  Patient educated about wound care, arm mobility, lifting restrictions. ROV in 3 months with implanting physician.  Device interrogated by Delray Alt.  See media for report.  V[place] VS- changed from 120 ms to 130 ms for improved CCM device therapy.

## 2023-01-17 NOTE — Patient Instructions (Signed)
After Your Pacemaker   Monitor your pacemaker site for redness, swelling, and drainage. Call the device clinic at [phone] if you experience these symptoms or fever/chills.  Your incision was closed with Dermabond:  You may shower 1 day after your defibrillator implant and wash your incision with soap and water. Avoid lotions, ointments, or perfumes over your incision until it is well-healed.  You may use a hot tub or a pool after your wound check appointment if the incision is completely closed.  Do not lift, push or pull greater than 10 pounds with the affected arm until 6 weeks after your procedure. There are no other restrictions in arm movement after your wound check appointment.  You may drive, unless driving has been restricted by your healthcare providers.

## 2023-01-21 ENCOUNTER — Ambulatory Visit: Payer: Medicare Other | Attending: Internal Medicine

## 2023-01-21 DIAGNOSIS — Z95810 Presence of automatic (implantable) cardiac defibrillator: Secondary | ICD-10-CM

## 2023-01-21 DIAGNOSIS — I5042 Chronic combined systolic (congestive) and diastolic (congestive) heart failure: Secondary | ICD-10-CM

## 2023-01-23 ENCOUNTER — Telehealth: Payer: Self-pay

## 2023-01-23 NOTE — Progress Notes (Signed)
EPIC Encounter for ICM Monitoring  Patient Name: [name] [name] is a 77 y.o. male Date: 01/23/2023 Primary Care Physican: [name], MD Primary Cardiologist: Benismhon Electrophysiologist: Graciela Husbands 01/08/2023 Office Weight: 212 lbs                                                             Attempted call to patient and unable to reach.  No answer or voice mail option.  Transmission reviewed.   ED visit 9/18   Optivol thoracic impedance suggesting fluid levels returned to normal.   CCM Implant 9/6.   Prescribed:  Furosemide 20 mg take 1 tablet(s) (20 mg total) by mouth daily.   Labs: 01/16/2023 Creatinine 2.18, BUN 29, Potassium 4.2, Sodium 138, GFR 30  12/11/2022 Creatinine 2.02, BUN 26, Potassium 4.3, Sodium 138, GFR 33  11/27/2022 Creatinine 2.12, BUN 31, Potassium 4.3, Sodium 135, GFR 31 11/13/2022 Creatinine 2.60, BUN 39, Potassium 4.6, Sodium 138, GFR 25 A complete set of results can be found in Results Review.   Recommendations:  Unable to reach.     Follow-up plan: ICM clinic phone appointment on 02/18/2023.   91 day device clinic remote transmission 03/05/2023.     EP/Cardiology Office Visits:  04/10/2023 with Dr. Graciela Husbands.  04/19/2023 Dr Gala Romney 3 month f/u.     Copy of ICM check sent to Dr. Graciela Husbands.   3 month ICM trend: 01/21/2023.    12-14 Month ICM trend:     [name], RN 01/23/2023 9:42 AM

## 2023-01-23 NOTE — Telephone Encounter (Signed)
Remote ICM transmission received.  Attempted call to patient regarding ICM remote transmission and no answer or voice mail option.

## 2023-02-04 ENCOUNTER — Encounter: Payer: Self-pay | Admitting: Internal Medicine

## 2023-02-18 ENCOUNTER — Ambulatory Visit: Payer: Medicare Other | Attending: Internal Medicine

## 2023-02-18 DIAGNOSIS — I5042 Chronic combined systolic (congestive) and diastolic (congestive) heart failure: Secondary | ICD-10-CM

## 2023-02-18 DIAGNOSIS — Z95810 Presence of automatic (implantable) cardiac defibrillator: Secondary | ICD-10-CM | POA: Diagnosis not present

## 2023-02-20 NOTE — Progress Notes (Signed)
EPIC Encounter for ICM Monitoring  Patient Name: [name] [name] is a 77 y.o. male Date: 02/20/2023 Primary Care Physican: [name], MD Primary Cardiologist: Benismhon Electrophysiologist: Graciela Husbands 01/08/2023 Office Weight: 212 lbs  02/20/2023 Weight: 203 lbs                                                           Spoke with patient and heart failure questions reviewed.  Transmission results reviewed.  Pt reports SOB a couple of nights ago and resolved with taking extra Torsemide.    Optivol thoracic impedance suggesting possible fluid accumulation starting 10/13 but trending back toward baseline.   CCM Implant 9/6.   Prescribed:  Torsemide 20 mg take 1 tablet(s) (20 mg total) by mouth daily.   02/20/2023 Pt self adjust Torsemide when needed.     Labs: 01/16/2023 Creatinine 2.18, BUN 29, Potassium 4.2, Sodium 138, GFR 30  12/11/2022 Creatinine 2.02, BUN 26, Potassium 4.3, Sodium 138, GFR 33  11/27/2022 Creatinine 2.12, BUN 31, Potassium 4.3, Sodium 135, GFR 31 11/13/2022 Creatinine 2.60, BUN 39, Potassium 4.6, Sodium 138, GFR 25 A complete set of results can be found in Results Review.   Recommendations:  Encouraged to limit salt and fluid intake. No changes and encouraged to call if experiencing any fluid symptoms.   Follow-up plan: ICM clinic phone appointment on 03/25/2023.   91 day device clinic remote transmission 03/05/2023.     EP/Cardiology Office Visits:  04/10/2023 with Dr. Graciela Husbands.  04/19/2023 Dr Gala Romney 3 month f/u.     Copy of ICM check sent to Dr. Graciela Husbands.   3 month ICM trend: 02/18/2023.    12-14 Month ICM trend:     [name], RN 02/20/2023 2:33 PM

## 2023-03-04 ENCOUNTER — Encounter (HOSPITAL_COMMUNITY): Payer: Self-pay

## 2023-03-04 ENCOUNTER — Emergency Department (HOSPITAL_COMMUNITY): Payer: Medicare Other

## 2023-03-04 ENCOUNTER — Inpatient Hospital Stay (HOSPITAL_COMMUNITY)
Admission: EM | Admit: 2023-03-04 | Discharge: 2023-03-08 | DRG: 866 | Disposition: A | Discharge status: Home Health | Payer: Medicare Other | Attending: Internal Medicine | Admitting: Internal Medicine

## 2023-03-04 ENCOUNTER — Other Ambulatory Visit: Payer: Self-pay

## 2023-03-04 DIAGNOSIS — B349 Viral infection, unspecified: Principal | ICD-10-CM | POA: Diagnosis present

## 2023-03-04 DIAGNOSIS — Z4502 Encounter for adjustment and management of automatic implantable cardiac defibrillator: Secondary | ICD-10-CM

## 2023-03-04 DIAGNOSIS — I951 Orthostatic hypotension: Secondary | ICD-10-CM | POA: Diagnosis not present

## 2023-03-04 DIAGNOSIS — N183 Chronic kidney disease, stage 3 unspecified: Secondary | ICD-10-CM

## 2023-03-04 DIAGNOSIS — N179 Acute kidney failure, unspecified: Secondary | ICD-10-CM | POA: Diagnosis present

## 2023-03-04 DIAGNOSIS — E860 Dehydration: Secondary | ICD-10-CM | POA: Diagnosis not present

## 2023-03-04 DIAGNOSIS — I959 Hypotension, unspecified: Secondary | ICD-10-CM | POA: Diagnosis present

## 2023-03-04 DIAGNOSIS — M25551 Pain in right hip: Secondary | ICD-10-CM | POA: Diagnosis present

## 2023-03-04 DIAGNOSIS — Z8249 Family history of ischemic heart disease and other diseases of the circulatory system: Secondary | ICD-10-CM

## 2023-03-04 DIAGNOSIS — Z79899 Other long term (current) drug therapy: Secondary | ICD-10-CM

## 2023-03-04 DIAGNOSIS — Z86718 Personal history of other venous thrombosis and embolism: Secondary | ICD-10-CM

## 2023-03-04 DIAGNOSIS — R059 Cough, unspecified: Secondary | ICD-10-CM | POA: Diagnosis present

## 2023-03-04 DIAGNOSIS — I2510 Atherosclerotic heart disease of native coronary artery without angina pectoris: Secondary | ICD-10-CM | POA: Diagnosis present

## 2023-03-04 DIAGNOSIS — W109XXA Fall (on) (from) unspecified stairs and steps, initial encounter: Secondary | ICD-10-CM | POA: Diagnosis present

## 2023-03-04 DIAGNOSIS — Z888 Allergy status to other drugs, medicaments and biological substances status: Secondary | ICD-10-CM

## 2023-03-04 DIAGNOSIS — E871 Hypo-osmolality and hyponatremia: Secondary | ICD-10-CM | POA: Diagnosis present

## 2023-03-04 DIAGNOSIS — I5042 Chronic combined systolic (congestive) and diastolic (congestive) heart failure: Secondary | ICD-10-CM | POA: Diagnosis present

## 2023-03-04 DIAGNOSIS — Z95810 Presence of automatic (implantable) cardiac defibrillator: Secondary | ICD-10-CM

## 2023-03-04 DIAGNOSIS — I429 Cardiomyopathy, unspecified: Secondary | ICD-10-CM | POA: Diagnosis present

## 2023-03-04 DIAGNOSIS — R9431 Abnormal electrocardiogram [ECG] [EKG]: Secondary | ICD-10-CM | POA: Diagnosis not present

## 2023-03-04 DIAGNOSIS — S42031A Displaced fracture of lateral end of right clavicle, initial encounter for closed fracture: Secondary | ICD-10-CM | POA: Diagnosis present

## 2023-03-04 DIAGNOSIS — N189 Chronic kidney disease, unspecified: Secondary | ICD-10-CM | POA: Diagnosis present

## 2023-03-04 DIAGNOSIS — I130 Hypertensive heart and chronic kidney disease with heart failure and stage 1 through stage 4 chronic kidney disease, or unspecified chronic kidney disease: Secondary | ICD-10-CM | POA: Diagnosis present

## 2023-03-04 DIAGNOSIS — E041 Nontoxic single thyroid nodule: Secondary | ICD-10-CM | POA: Diagnosis present

## 2023-03-04 DIAGNOSIS — R55 Syncope and collapse: Principal | ICD-10-CM | POA: Diagnosis present

## 2023-03-04 DIAGNOSIS — I4892 Unspecified atrial flutter: Secondary | ICD-10-CM | POA: Diagnosis present

## 2023-03-04 DIAGNOSIS — E119 Type 2 diabetes mellitus without complications: Secondary | ICD-10-CM

## 2023-03-04 DIAGNOSIS — S0990XA Unspecified injury of head, initial encounter: Secondary | ICD-10-CM

## 2023-03-04 DIAGNOSIS — Z951 Presence of aortocoronary bypass graft: Secondary | ICD-10-CM

## 2023-03-04 DIAGNOSIS — Z87891 Personal history of nicotine dependence: Secondary | ICD-10-CM

## 2023-03-04 DIAGNOSIS — E1151 Type 2 diabetes mellitus with diabetic peripheral angiopathy without gangrene: Secondary | ICD-10-CM | POA: Diagnosis present

## 2023-03-04 DIAGNOSIS — Z9049 Acquired absence of other specified parts of digestive tract: Secondary | ICD-10-CM

## 2023-03-04 DIAGNOSIS — W19XXXA Unspecified fall, initial encounter: Secondary | ICD-10-CM

## 2023-03-04 DIAGNOSIS — E1122 Type 2 diabetes mellitus with diabetic chronic kidney disease: Secondary | ICD-10-CM | POA: Diagnosis present

## 2023-03-04 DIAGNOSIS — I495 Sick sinus syndrome: Secondary | ICD-10-CM | POA: Diagnosis present

## 2023-03-04 DIAGNOSIS — Y92009 Unspecified place in unspecified non-institutional (private) residence as the place of occurrence of the external cause: Secondary | ICD-10-CM

## 2023-03-04 DIAGNOSIS — R6510 Systemic inflammatory response syndrome (SIRS) of non-infectious origin without acute organ dysfunction: Secondary | ICD-10-CM | POA: Diagnosis present

## 2023-03-04 DIAGNOSIS — I5020 Unspecified systolic (congestive) heart failure: Secondary | ICD-10-CM | POA: Diagnosis not present

## 2023-03-04 DIAGNOSIS — S0003XA Contusion of scalp, initial encounter: Secondary | ICD-10-CM | POA: Diagnosis present

## 2023-03-04 DIAGNOSIS — D72829 Elevated white blood cell count, unspecified: Secondary | ICD-10-CM

## 2023-03-04 DIAGNOSIS — I4819 Other persistent atrial fibrillation: Secondary | ICD-10-CM | POA: Diagnosis present

## 2023-03-04 DIAGNOSIS — N1832 Chronic kidney disease, stage 3b: Secondary | ICD-10-CM | POA: Diagnosis present

## 2023-03-04 DIAGNOSIS — Z7901 Long term (current) use of anticoagulants: Secondary | ICD-10-CM

## 2023-03-04 DIAGNOSIS — Z8551 Personal history of malignant neoplasm of bladder: Secondary | ICD-10-CM

## 2023-03-04 DIAGNOSIS — Z1152 Encounter for screening for COVID-19: Secondary | ICD-10-CM

## 2023-03-04 LAB — COMPREHENSIVE METABOLIC PANEL WITH GFR
ALT: 79 U/L — ABNORMAL HIGH (ref 0–44)
AST: 53 U/L — ABNORMAL HIGH (ref 15–41)
Albumin: 2.7 g/dL — ABNORMAL LOW (ref 3.5–5.0)
Alkaline Phosphatase: 84 U/L (ref 38–126)
Anion gap: 11 (ref 5–15)
BUN: 39 mg/dL — ABNORMAL HIGH (ref 8–23)
CO2: 21 mmol/L — ABNORMAL LOW (ref 22–32)
Calcium: 8.1 mg/dL — ABNORMAL LOW (ref 8.9–10.3)
Chloride: 99 mmol/L (ref 98–111)
Creatinine, Ser: 2.52 mg/dL — ABNORMAL HIGH (ref 0.61–1.24)
GFR, Estimated: 26 mL/min — ABNORMAL LOW
Glucose, Bld: 209 mg/dL — ABNORMAL HIGH (ref 70–99)
Potassium: 4.3 mmol/L (ref 3.5–5.1)
Sodium: 131 mmol/L — ABNORMAL LOW (ref 135–145)
Total Bilirubin: 1.4 mg/dL — ABNORMAL HIGH
Total Protein: 5.9 g/dL — ABNORMAL LOW (ref 6.5–8.1)

## 2023-03-04 LAB — CBC
HCT: 39 % (ref 39.0–52.0)
Hemoglobin: 12.5 g/dL — ABNORMAL LOW (ref 13.0–17.0)
MCH: 28.3 pg (ref 26.0–34.0)
MCHC: 32.1 g/dL (ref 30.0–36.0)
MCV: 88.4 fL (ref 80.0–100.0)
Platelets: 336 10*3/uL (ref 150–400)
RBC: 4.41 MIL/uL (ref 4.22–5.81)
RDW: 15.9 % — ABNORMAL HIGH (ref 11.5–15.5)
WBC: 22.1 10*3/uL — ABNORMAL HIGH (ref 4.0–10.5)
nRBC: 0 % (ref 0.0–0.2)

## 2023-03-04 LAB — TROPONIN I (HIGH SENSITIVITY): Troponin I (High Sensitivity): 25 ng/L — ABNORMAL HIGH (ref <18)

## 2023-03-04 MED ORDER — VANCOMYCIN HCL IN DEXTROSE 1-5 GM/200ML-% IV SOLN: 1000.0000 mg | Freq: Once | INTRAVENOUS | Status: DC

## 2023-03-04 MED ORDER — LACTATED RINGERS IV BOLUS
500.0000 mL | Freq: Once | INTRAVENOUS | Status: AC
  Administered 2023-03-05: 500 mL via INTRAVENOUS

## 2023-03-04 MED ORDER — SODIUM CHLORIDE 0.9 % IV BOLUS
500.0000 mL | Freq: Once | INTRAVENOUS | Status: AC
  Administered 2023-03-04: 500 mL via INTRAVENOUS

## 2023-03-04 MED ORDER — METRONIDAZOLE 500 MG/100ML IV SOLN
500.0000 mg | Freq: Once | INTRAVENOUS | Status: AC
  Administered 2023-03-05: 500 mg via INTRAVENOUS
  Filled 2023-03-04: qty 100

## 2023-03-04 MED ORDER — VANCOMYCIN HCL 2000 MG/400ML IV SOLN
2000.0000 mg | Freq: Once | INTRAVENOUS | Status: AC
  Administered 2023-03-05: 2000 mg via INTRAVENOUS
  Filled 2023-03-04: qty 400

## 2023-03-04 MED ORDER — SODIUM CHLORIDE 0.9 % IV SOLN
2.0000 g | Freq: Once | INTRAVENOUS | Status: AC
  Administered 2023-03-04: 2 g via INTRAVENOUS
  Filled 2023-03-04: qty 12.5

## 2023-03-04 NOTE — ED Triage Notes (Signed)
Pt bib [facility] coming from home after pt had syncopal episode and fell down stairs. He did hit his head. Hematoma noted to top right side of head. Pt is on eliquis (took at 1900), pt has pacemaker and CABG. EMS reports pt did have syncopal episode yesterday as well. EMS reports that patient stopped taking Ozempic last week and has been having syncopal episodes since then. EMS notes minor skin tear to right hand as well. GCS 15.   EMS vitals: 90/60 initally -  118/58 manual bp 90s HR 99% RA 258cbg Normal Saline given

## 2023-03-04 NOTE — ED Notes (Signed)
Patient transported to CT 

## 2023-03-04 NOTE — ED Notes (Signed)
Nurse will get blood cultures. 

## 2023-03-04 NOTE — ED Notes (Signed)
Unsuccessful attempt at cultures x3. Spoke with phlebotomy about getting blood cultures. Phlebotomy now at bedside.

## 2023-03-04 NOTE — Progress Notes (Signed)
   03/04/23 2130  Spiritual Encounters  Type of Visit Initial  Care provided to: Pt not available  Referral source Trauma page  Reason for visit Trauma  OnCall Visit No   Chaplain responded to a level two trauma. The patient, [name] [name] was attended to by the medical team.  No family is present. If a chaplain is requested someone will respond.  Valerie Roys [facility]  [phone]

## 2023-03-04 NOTE — Progress Notes (Signed)
ED Pharmacy Antibiotic Sign Off An antibiotic consult was received from an ED provider for cefepime and vancomycin per pharmacy dosing for sepsis. A chart review was completed to assess appropriateness.   The following one time order(s) were placed:  Vancomycin 2g x 1  Cefepime 2g x 1   Further antibiotic and/or antibiotic pharmacy consults should be ordered by the admitting provider if indicated.   Thank you for allowing pharmacy to be a part of this patient's care.   Marja Kays, [facility]  Clinical Pharmacist 03/04/23 11:19 PM

## 2023-03-04 NOTE — ED Notes (Signed)
Pt bib [facility] coming from home after pt had syncopal episode and fell down stairs. He did hit his head. Hematoma noted to top right side of head. Pt is on eliquis (took at 1900), pt has pacemaker and CABG. EMS reports pt did have syncopal episode yesterday as well. EMS reports that patient stopped taking Ozempic last week and has been having syncopal episodes since then. EMS notes minor skin tear to right hand as well. GCS 15.  EMS vitals: 90/60 initally -  118/58 manual bp 90s HR 99% RA 258cbg

## 2023-03-04 NOTE — ED Provider Notes (Signed)
[place] EMERGENCY DEPARTMENT AT [facility] Provider Note   CSN: [id number] Arrival date & time: 03/04/23  2113     History  Chief Complaint  Patient presents with   Trauma    [name] [name] is a 77 y.o. male.  HPI Patient presents after syncopal episode and fall.  Came in as a level 2 trauma due to anticoagulation.  On anticoagulation for A-fib.  Has pacemaker and CCM implant.  Did hit head and had hematoma.  No loss conscious.  Hypotensive for EMS.  However states he recently stopped Ozempic.  Had been feeling bad and decreased oral intake.  Also states he had fever and chills the last couple days.  No fever here.  No cough.  No dysuria.  No sore throat.   Past Medical History:  Diagnosis Date   AICD (automatic cardioverter/defibrillator) present    Arthritis    Bladder cancer (HCC)    Borderline glaucoma    Chronic systolic (congestive) heart failure (HCC)    Coronary artery disease    Diabetes mellitus type 2, controlled (HCC) ORAL MED   Essential hypertension    Heart murmur    Hepatitis    Medical history non-contributory    Paroxysmal A-fib (HCC)    Paroxysmal A-fib (HCC) 08/20/2020   Peripheral vascular disease (HCC)    Pneumonia     Home Medications Prior to Admission medications   Medication Sig Start Date End Date Taking? Authorizing Provider  amiodarone (PACERONE) 200 MG tablet Take 1 tablet (200 mg total) by mouth 2 (two) times daily. 11/13/22   Andrey Farmer, PA-C  amLODipine (NORVASC) 5 MG tablet Take 1 tablet (5 mg total) by mouth daily. 10/08/22 01/08/24  Bensimhon, Bevelyn Buckles, MD  atorvastatin (LIPITOR) 80 MG tablet TAKE 1 TABLET BY MOUTH EVERY DAY 07/27/22   Bensimhon, Bevelyn Buckles, MD  ELIQUIS 5 MG TABS tablet TAKE 1 TABLET TWICE A DAY 10/03/22   Bensimhon, Bevelyn Buckles, MD  oxymetazoline (DRISTAN) 0.05 % nasal spray Place 1 spray into both nostrils at bedtime.    [provider]  OZEMPIC, 0.25 OR 0.5 MG/DOSE, 2 MG/3ML SOPN Inject  into the skin. 12/21/22   [provider]  sacubitril-valsartan (ENTRESTO) 49-51 MG Take 1 tablet by mouth 2 (two) times daily. 01/08/23   Bensimhon, Bevelyn Buckles, MD  torsemide (DEMADEX) 20 MG tablet Take 1 tablet (20 mg total) by mouth daily. 11/21/22   Andrey Farmer, PA-C  zolpidem (AMBIEN) 10 MG tablet Take 5 mg by mouth at bedtime. 12/13/22   [provider]      Allergies    Spironolactone, Empagliflozin, and Niacin and related    Review of Systems   Review of Systems  Physical Exam Updated Vital Signs BP (!) 91/54   Pulse (!) 101   Temp 98.4 F (36.9 C) (Oral)   Resp 20   Ht 6\' 2"  (1.88 m)   Wt 91.2 kg   SpO2 96%   BMI 25.81 kg/m  Physical Exam Vitals reviewed.  Constitutional:      Appearance: Normal appearance.  HENT:     Head:     Comments: Occipital hematoma. Cardiovascular:     Rate and Rhythm: Normal rate. Rhythm irregular.  Pulmonary:     Breath sounds: No wheezing.  Abdominal:     Tenderness: There is no abdominal tenderness.  Musculoskeletal:        General: No tenderness.     Comments: Skin tear to dorsum of  right hand without underlying bony tenderness.  Neurological:     Mental Status: He is oriented to person, place, and time.     ED Results / Procedures / Treatments   Labs (all labs ordered are listed, but only abnormal results are displayed) Labs Reviewed  COMPREHENSIVE METABOLIC PANEL - Abnormal; Notable for the following components:      Result Value   Sodium 131 (*)    CO2 21 (*)    Glucose, Bld 209 (*)    BUN 39 (*)    Creatinine, Ser 2.52 (*)    Calcium 8.1 (*)    Total Protein 5.9 (*)    Albumin 2.7 (*)    AST 53 (*)    ALT 79 (*)    Total Bilirubin 1.4 (*)    GFR, Estimated 26 (*)    All other components within normal limits  CBC - Abnormal; Notable for the following components:   WBC 22.1 (*)    Hemoglobin 12.5 (*)    RDW 15.9 (*)    All other components within normal limits  TROPONIN I (HIGH  SENSITIVITY) - Abnormal; Notable for the following components:   Troponin I (High Sensitivity) 25 (*)    All other components within normal limits  CULTURE, BLOOD (ROUTINE X 2)  CULTURE, BLOOD (ROUTINE X 2)  URINALYSIS, W/ REFLEX TO CULTURE (INFECTION SUSPECTED)  I-STAT CG4 LACTIC ACID, ED  TROPONIN I (HIGH SENSITIVITY)    EKG None  Radiology CT HEAD WO CONTRAST ( )  Result Date: 03/04/2023 CLINICAL DATA:  Head trauma, minor (Age >= 65y); Neck trauma (Age >= 65y), fall, chronic anticoagulation EXAM: CT HEAD WITHOUT CONTRAST CT CERVICAL SPINE WITHOUT CONTRAST TECHNIQUE: Multidetector CT imaging of the head and cervical spine was performed following the standard protocol without intravenous contrast. Multiplanar CT image reconstructions of the cervical spine were also generated. RADIATION DOSE REDUCTION: This exam was performed according to the departmental dose-optimization program which includes automated exposure control, adjustment of the mA and/or kV according to patient size and/or use of iterative reconstruction technique. COMPARISON:  None Available. FINDINGS: CT HEAD FINDINGS Brain: No evidence of acute infarction, hemorrhage, hydrocephalus, extra-axial collection or mass lesion/mass effect. Vascular: No hyperdense vessel or unexpected calcification. Skull: Normal. Negative for fracture or focal lesion. Sinuses/Orbits: No acute finding. Other: Mastoid air cells and middle ear cavities are clear. Small right parietal scalp hematoma. CT CERVICAL SPINE FINDINGS Alignment: Normal. Skull base and vertebrae: Craniocervical alignment is normal. The atlantodental interval is not widened. No acute fracture of the cervical spine. Vertebral body height is preserved. Soft tissues and spinal canal: No prevertebral fluid or swelling. No visible canal hematoma. Disc levels: There is endplate remodeling throughout the cervical spine in keeping with changes of diffuse moderate degenerative disc disease.  Prevertebral soft tissues are not thickened on sagittal reformats. The spinal canal is widely patent. No significant neuroforaminal narrowing. Upper chest: 19 mm nodule is seen within the right thyroid gland demonstrating punctate internal calcification and heterogeneous internal enhancement. This is not well characterized on this examination. Visualized lung apices demonstrate at least mild emphysematous. Other: None IMPRESSION: 1. No acute intracranial abnormality. No calvarial fracture. Small right parietal scalp hematoma. 2. No acute fracture or listhesis of the cervical spine. 3. 19 mm nodule within the right thyroid gland, not well characterized on this examination. Recommend thyroid US (ref: J Am Coll Radiol. 2015 Feb;12(2): 143-50). Emphysema (ICD10-J43.9). Electronically Signed   By: Helyn Numbers M.D.   On: 03/04/2023 21:45  CT Cervical Spine Wo Contrast  Result Date: 03/04/2023 CLINICAL DATA:  Head trauma, minor (Age >= 65y); Neck trauma (Age >= 65y), fall, chronic anticoagulation EXAM: CT HEAD WITHOUT CONTRAST CT CERVICAL SPINE WITHOUT CONTRAST TECHNIQUE: Multidetector CT imaging of the head and cervical spine was performed following the standard protocol without intravenous contrast. Multiplanar CT image reconstructions of the cervical spine were also generated. RADIATION DOSE REDUCTION: This exam was performed according to the departmental dose-optimization program which includes automated exposure control, adjustment of the mA and/or kV according to patient size and/or use of iterative reconstruction technique. COMPARISON:  None Available. FINDINGS: CT HEAD FINDINGS Brain: No evidence of acute infarction, hemorrhage, hydrocephalus, extra-axial collection or mass lesion/mass effect. Vascular: No hyperdense vessel or unexpected calcification. Skull: Normal. Negative for fracture or focal lesion. Sinuses/Orbits: No acute finding. Other: Mastoid air cells and middle ear cavities are clear. Small  right parietal scalp hematoma. CT CERVICAL SPINE FINDINGS Alignment: Normal. Skull base and vertebrae: Craniocervical alignment is normal. The atlantodental interval is not widened. No acute fracture of the cervical spine. Vertebral body height is preserved. Soft tissues and spinal canal: No prevertebral fluid or swelling. No visible canal hematoma. Disc levels: There is endplate remodeling throughout the cervical spine in keeping with changes of diffuse moderate degenerative disc disease. Prevertebral soft tissues are not thickened on sagittal reformats. The spinal canal is widely patent. No significant neuroforaminal narrowing. Upper chest: 19 mm nodule is seen within the right thyroid gland demonstrating punctate internal calcification and heterogeneous internal enhancement. This is not well characterized on this examination. Visualized lung apices demonstrate at least mild emphysematous. Other: None IMPRESSION: 1. No acute intracranial abnormality. No calvarial fracture. Small right parietal scalp hematoma. 2. No acute fracture or listhesis of the cervical spine. 3. 19 mm nodule within the right thyroid gland, not well characterized on this examination. Recommend thyroid US (ref: J Am Coll Radiol. 2015 Feb;12(2): 143-50). Emphysema (ICD10-J43.9). Electronically Signed   By: Helyn Numbers M.D.   On: 03/04/2023 21:45   DG Chest Portable 1 View  Result Date: 03/04/2023 CLINICAL DATA:  Syncope, fall. EXAM: PORTABLE CHEST 1 VIEW COMPARISON:  01/16/2023. FINDINGS: The heart is enlarged and the mediastinal contour is within normal limits. There is atherosclerotic calcification of the aorta. Interstitial prominence is present bilaterally and likely chronic. No consolidation, effusion, or pneumothorax. Multi lead pacemaker devices are present bilaterally. Sternotomy wires are noted. No acute osseous abnormality is seen. IMPRESSION: 1. No active disease. 2. Cardiomegaly. 3. Chronic interstitial changes bilaterally.  Electronically Signed   By: Thornell Sartorius M.D.   On: 03/04/2023 21:44    Procedures Procedures    Medications Ordered in ED Medications  lactated ringers bolus 500 mL (has no administration in time range)  ceFEPIme (MAXIPIME) 2 g in sodium chloride 0.9 % 100 mL IVPB (has no administration in time range)  metroNIDAZOLE (FLAGYL) IVPB 500 mg (has no administration in time range)  vancomycin (VANCOCIN) IVPB 1000 mg/200 mL premix (has no administration in time range)  sodium chloride 0.9 % bolus 500 mL (500 mLs Intravenous New Bag/Given 03/04/23 2253)    ED Course/ Medical Decision Making/ A&P                                 Medical Decision Making Amount and/or Complexity of Data Reviewed Labs: ordered. Radiology: ordered.  Risk Prescription drug management.   Patient presented as a level 2 trauma.  Fall.  However had syncope and fell down a few stairs.  Reportedly hit head.  No other real signs of injury.  However had syncope before the fall and had syncopal episode yesterday.  Has had decreased oral intake due to the Ozempic but also reportedly had had some fevers at home the last couple days but not today.  Head CT done emergently and shows no intracranial hemorrhage.  Chest x-ray shows no infection or injury.  However white count elevated.  Does put worry for infection.  Has developed some hypotension.  Could be due to the dehydration but with leukocytosis I think needs some antibiotics.  Fluids being given judiciously due to heart failure.  Likely overall is volume depleted however.  Care will be turned over to Dr. Bebe Shaggy.  CRITICAL CARE Performed by: Benjiman Core Total critical care time: 30 minutes Critical care time was exclusive of separately billable procedures and treating other patients. Critical care was necessary to treat or prevent imminent or life-threatening deterioration. Critical care was time spent personally by me on the following activities:  development of treatment plan with patient and/or surrogate as well as nursing, discussions with consultants, evaluation of patient's response to treatment, examination of patient, obtaining history from patient or surrogate, ordering and performing treatments and interventions, ordering and review of laboratory studies, ordering and review of radiographic studies, pulse oximetry and re-evaluation of patient's condition.        Final Clinical Impression(s) / ED Diagnoses Final diagnoses:  Syncope, unspecified syncope type  Injury of head, initial encounter  Dehydration  Leukocytosis, unspecified type    Rx / DC Orders ED Discharge Orders     None         Benjiman Core, MD 03/04/23 2343

## 2023-03-04 NOTE — ED Notes (Signed)
Trauma Response Nurse Documentation   [name] [name] is a 77 y.o. male arriving to [facility] ED via EMS  On Eliquis (apixaban) daily. Trauma was activated as a Level 2 by ED charge RN based on the following trauma criteria Elderly patients > 65 with head trauma on anti-coagulation (excluding ASA).  Patient cleared for CT by Dr. Rubin Payor EDP. Pt transported to CT with trauma response nurse present to monitor. RN remained with the patient throughout their absence from the department for clinical observation.   GCS 15.  History   Past Medical History:  Diagnosis Date   AICD (automatic cardioverter/defibrillator) present    Arthritis    Bladder cancer (HCC)    Borderline glaucoma    Chronic systolic (congestive) heart failure (HCC)    Coronary artery disease    Diabetes mellitus type 2, controlled (HCC) ORAL MED   Essential hypertension    Heart murmur    Hepatitis    Medical history non-contributory    Paroxysmal A-fib (HCC)    Paroxysmal A-fib (HCC) 08/20/2020   Peripheral vascular disease (HCC)    Pneumonia      Past Surgical History:  Procedure Laterality Date   CARDIOVERSION N/A 08/10/2021   Procedure: CARDIOVERSION;  Surgeon: Dolores Patty, MD;  Location: [facility] ENDOSCOPY;  Service: Cardiovascular;  Laterality: N/A;   CARDIOVERSION N/A 11/09/2022   Procedure: CARDIOVERSION;  Surgeon: Maisie Fus, MD;  Location: [facility] INVASIVE CV LAB;  Service: Cardiovascular;  Laterality: N/A;   CAROTID DUPLEX SCAN  07-26-2010   BILATERAL ICA  STENOSIS 1% - 39%   CCM GENERATOR INSERTION N/A 01/04/2023   Procedure: CCM GENERATOR INSERTION;  Surgeon: Duke Salvia, MD;  Location: [facility] INVASIVE CV LAB;  Service: Cardiovascular;  Laterality: N/A;   CHOLECYSTECTOMY N/A 08/22/2020   Procedure: LAPAROSCOPIC CHOLECYSTECTOMY;  Surgeon: Emelia Loron, MD;  Location: [facility] OR;  Service: General;  Laterality: N/A;   CORONARY ARTERY BYPASS GRAFT N/A 04/08/2017   Procedure: CORONARY ARTERY BYPASS  GRAFTING (CABG) TIMES TWO USING LEFT INTERNAL MAMMARY ARTERY AND LEFT SAPHENOUS LEG VEIN HARVESTED ENDOSCOPICALLY.  LEG VEIN ALSO HARVESTED FROM THE RIGHT LEG;  Surgeon: Alleen Borne, MD;  Location: [facility] OR;  Service: Open Heart Surgery;  Laterality: N/A;   CYSTOSCOPY WITH BIOPSY N/A 08/01/2012   Procedure: CYSTOSCOPY WITH BIOPSY BLADDER BIOPSY   ;  Surgeon: Antony Haste, MD;  Location: [facility];  Service: Urology;  Laterality: N/A;   ERCP N/A 08/21/2020   Procedure: ENDOSCOPIC RETROGRADE CHOLANGIOPANCREATOGRAPHY (ERCP);  Surgeon: Jeani Hawking, MD;  Location: [facility] ENDOSCOPY;  Service: Endoscopy;  Laterality: N/A;   FULGURATION OF BLADDER TUMOR N/A 08/01/2012   Procedure: FULGURATION OF BLADDER TUMOR;  Surgeon: Antony Haste, MD;  Location: [facility];  Service: Urology;  Laterality: N/A;   ICD IMPLANT  11/20/2017   ICD IMPLANT N/A 11/20/2017   Procedure: ICD IMPLANT;  Surgeon: Duke Salvia, MD;  Location: [facility] INVASIVE CV LAB;  Service: Cardiovascular;  Laterality: N/A;   LEFT HEART CATH AND CORONARY ANGIOGRAPHY N/A 03/31/2017   Procedure: LEFT HEART CATH AND CORONARY ANGIOGRAPHY;  Surgeon: Marykay Lex, MD;  Location: [facility] INVASIVE CV LAB;  Service: Cardiovascular;  Laterality: N/A;   LUMBAR LAMINECTOMY/DECOMPRESSION MICRODISCECTOMY Bilateral 06/16/2019   Procedure: Bilateral Lumbar Four-Five Laminectomy and Foraminotomy;  Surgeon: Julio Sicks, MD;  Location: [facility] OR;  Service: Neurosurgery;  Laterality: Bilateral;  posterior   REMOVAL OF STONES  08/21/2020   Procedure: REMOVAL OF STONES;  Surgeon: Jeani Hawking, MD;  Location: [facility] ENDOSCOPY;  Service: Endoscopy;;   RIGHT HEART CATH Right 03/31/2017   Procedure: RIGHT HEART CATH;  Surgeon: Marykay Lex, MD;  Location: [facility] INVASIVE CV LAB;  Service: Cardiovascular;  Laterality: Right;   SPHINCTEROTOMY  08/21/2020   Procedure: SPHINCTEROTOMY;  Surgeon: Jeani Hawking, MD;  Location: [facility] ENDOSCOPY;   Service: Endoscopy;;   TEE WITHOUT CARDIOVERSION N/A 04/08/2017   Procedure: TRANSESOPHAGEAL ECHOCARDIOGRAM (TEE);  Surgeon: Alleen Borne, MD;  Location: [facility] OR;  Service: Open Heart Surgery;  Laterality: N/A;   TRANSURETHRAL RESECTION OF BLADDER TUMOR  05/29/2011   Procedure: TRANSURETHRAL RESECTION OF BLADDER TUMOR (TURBT);  Surgeon: Antony Haste, MD;  Location: [facility];  Service: Urology;  Laterality: N/A;   VENTRICULAR ASSIST DEVICE INSERTION Right 03/31/2017   Procedure: VENTRICULAR ASSIST DEVICE INSERTION;  Surgeon: Marykay Lex, MD;  Location: [facility] INVASIVE CV LAB;  Service: Cardiovascular;  Laterality: Right;       Initial Focused Assessment (If applicable, or please see trauma documentation): Airway patent/unobstructed, BS clear No obvious uncontrolled hemorrhage, skin tear to right hand with bleeding controlled GCS 15 CT's Completed:   CT head, C-spine   Interventions:  Trauma lab draw EKG C-collar cleared by Dr. Rubin Payor Portable chest XRAY CT head and c-spine [employer] pacer/defib interrogation  Plan for disposition:  Pending workup  Consults completed:  None at the time of this note  Event Summary: Multiple falls/syncopal episodes this week, most recently tonight down a set of stairs with right head hematoma. +LOC and repetitive questioning. GCS 15 on arrival to ED. Trauma imaging without acute injuries, syncopal workup in progress. MTP Summary (If applicable): NA  Bedside handoff with ED RN Mariel.    Neko Boyajian O Tiphany Fayson  Trauma Response RN  Please call TRN at [phone] for further assistance.

## 2023-03-05 ENCOUNTER — Other Ambulatory Visit: Payer: Self-pay

## 2023-03-05 ENCOUNTER — Ambulatory Visit (INDEPENDENT_AMBULATORY_CARE_PROVIDER_SITE_OTHER): Payer: Medicare Other

## 2023-03-05 ENCOUNTER — Emergency Department (HOSPITAL_COMMUNITY): Payer: Medicare Other

## 2023-03-05 DIAGNOSIS — N1832 Chronic kidney disease, stage 3b: Secondary | ICD-10-CM | POA: Diagnosis present

## 2023-03-05 DIAGNOSIS — N179 Acute kidney failure, unspecified: Secondary | ICD-10-CM

## 2023-03-05 DIAGNOSIS — I5020 Unspecified systolic (congestive) heart failure: Secondary | ICD-10-CM | POA: Diagnosis not present

## 2023-03-05 DIAGNOSIS — Z95810 Presence of automatic (implantable) cardiac defibrillator: Secondary | ICD-10-CM

## 2023-03-05 DIAGNOSIS — R55 Syncope and collapse: Secondary | ICD-10-CM | POA: Diagnosis not present

## 2023-03-05 DIAGNOSIS — E871 Hypo-osmolality and hyponatremia: Secondary | ICD-10-CM | POA: Diagnosis present

## 2023-03-05 DIAGNOSIS — N189 Chronic kidney disease, unspecified: Secondary | ICD-10-CM | POA: Diagnosis present

## 2023-03-05 DIAGNOSIS — I951 Orthostatic hypotension: Secondary | ICD-10-CM | POA: Diagnosis not present

## 2023-03-05 DIAGNOSIS — N183 Chronic kidney disease, stage 3 unspecified: Secondary | ICD-10-CM

## 2023-03-05 DIAGNOSIS — I4891 Unspecified atrial fibrillation: Secondary | ICD-10-CM | POA: Diagnosis not present

## 2023-03-05 DIAGNOSIS — I4892 Unspecified atrial flutter: Secondary | ICD-10-CM | POA: Diagnosis present

## 2023-03-05 DIAGNOSIS — S0003XA Contusion of scalp, initial encounter: Secondary | ICD-10-CM | POA: Diagnosis present

## 2023-03-05 DIAGNOSIS — W19XXXA Unspecified fall, initial encounter: Secondary | ICD-10-CM | POA: Diagnosis not present

## 2023-03-05 DIAGNOSIS — Z7901 Long term (current) use of anticoagulants: Secondary | ICD-10-CM | POA: Diagnosis not present

## 2023-03-05 DIAGNOSIS — I4819 Other persistent atrial fibrillation: Secondary | ICD-10-CM | POA: Diagnosis present

## 2023-03-05 DIAGNOSIS — E1122 Type 2 diabetes mellitus with diabetic chronic kidney disease: Secondary | ICD-10-CM | POA: Diagnosis present

## 2023-03-05 DIAGNOSIS — I130 Hypertensive heart and chronic kidney disease with heart failure and stage 1 through stage 4 chronic kidney disease, or unspecified chronic kidney disease: Secondary | ICD-10-CM | POA: Diagnosis present

## 2023-03-05 DIAGNOSIS — E041 Nontoxic single thyroid nodule: Secondary | ICD-10-CM | POA: Diagnosis present

## 2023-03-05 DIAGNOSIS — Y92009 Unspecified place in unspecified non-institutional (private) residence as the place of occurrence of the external cause: Secondary | ICD-10-CM

## 2023-03-05 DIAGNOSIS — I2510 Atherosclerotic heart disease of native coronary artery without angina pectoris: Secondary | ICD-10-CM | POA: Diagnosis present

## 2023-03-05 DIAGNOSIS — R6510 Systemic inflammatory response syndrome (SIRS) of non-infectious origin without acute organ dysfunction: Secondary | ICD-10-CM

## 2023-03-05 DIAGNOSIS — B349 Viral infection, unspecified: Secondary | ICD-10-CM | POA: Diagnosis present

## 2023-03-05 DIAGNOSIS — R9431 Abnormal electrocardiogram [ECG] [EKG]: Secondary | ICD-10-CM | POA: Diagnosis not present

## 2023-03-05 DIAGNOSIS — W109XXA Fall (on) (from) unspecified stairs and steps, initial encounter: Secondary | ICD-10-CM | POA: Diagnosis present

## 2023-03-05 DIAGNOSIS — I5042 Chronic combined systolic (congestive) and diastolic (congestive) heart failure: Secondary | ICD-10-CM

## 2023-03-05 DIAGNOSIS — Z4502 Encounter for adjustment and management of automatic implantable cardiac defibrillator: Secondary | ICD-10-CM | POA: Diagnosis not present

## 2023-03-05 DIAGNOSIS — I959 Hypotension, unspecified: Secondary | ICD-10-CM | POA: Diagnosis not present

## 2023-03-05 DIAGNOSIS — I495 Sick sinus syndrome: Secondary | ICD-10-CM | POA: Diagnosis present

## 2023-03-05 DIAGNOSIS — E860 Dehydration: Secondary | ICD-10-CM | POA: Diagnosis present

## 2023-03-05 DIAGNOSIS — I5022 Chronic systolic (congestive) heart failure: Secondary | ICD-10-CM | POA: Diagnosis not present

## 2023-03-05 DIAGNOSIS — E1151 Type 2 diabetes mellitus with diabetic peripheral angiopathy without gangrene: Secondary | ICD-10-CM | POA: Diagnosis present

## 2023-03-05 DIAGNOSIS — S42031A Displaced fracture of lateral end of right clavicle, initial encounter for closed fracture: Secondary | ICD-10-CM | POA: Diagnosis present

## 2023-03-05 DIAGNOSIS — Z8249 Family history of ischemic heart disease and other diseases of the circulatory system: Secondary | ICD-10-CM | POA: Diagnosis not present

## 2023-03-05 DIAGNOSIS — Z1152 Encounter for screening for COVID-19: Secondary | ICD-10-CM | POA: Diagnosis not present

## 2023-03-05 DIAGNOSIS — I429 Cardiomyopathy, unspecified: Secondary | ICD-10-CM | POA: Diagnosis present

## 2023-03-05 LAB — COMPREHENSIVE METABOLIC PANEL
ALT: 71 U/L — ABNORMAL HIGH (ref 0–44)
AST: 49 U/L — ABNORMAL HIGH (ref 15–41)
Albumin: 2.3 g/dL — ABNORMAL LOW (ref 3.5–5.0)
Alkaline Phosphatase: 70 U/L (ref 38–126)
Anion gap: 7 (ref 5–15)
BUN: 35 mg/dL — ABNORMAL HIGH (ref 8–23)
CO2: 21 mmol/L — ABNORMAL LOW (ref 22–32)
Calcium: 7.5 mg/dL — ABNORMAL LOW (ref 8.9–10.3)
Chloride: 102 mmol/L (ref 98–111)
Creatinine, Ser: 2.44 mg/dL — ABNORMAL HIGH (ref 0.61–1.24)
GFR, Estimated: 27 mL/min — ABNORMAL LOW (ref >60)
Glucose, Bld: 167 mg/dL — ABNORMAL HIGH (ref 70–99)
Potassium: 4.1 mmol/L (ref 3.5–5.1)
Sodium: 130 mmol/L — ABNORMAL LOW (ref 135–145)
Total Bilirubin: 1.5 mg/dL — ABNORMAL HIGH (ref <1.2)
Total Protein: 5.1 g/dL — ABNORMAL LOW (ref 6.5–8.1)

## 2023-03-05 LAB — CBC WITH DIFFERENTIAL/PLATELET
Abs Immature Granulocytes: 0.23 10*3/uL — ABNORMAL HIGH (ref 0.00–0.07)
Basophils Absolute: 0.1 10*3/uL (ref 0.0–0.1)
Basophils Relative: 0 %
Eosinophils Absolute: 0.1 10*3/uL (ref 0.0–0.5)
Eosinophils Relative: 0 %
HCT: 34.4 % — ABNORMAL LOW (ref 39.0–52.0)
Hemoglobin: 10.9 g/dL — ABNORMAL LOW (ref 13.0–17.0)
Immature Granulocytes: 1 %
Lymphocytes Relative: 6 %
Lymphs Abs: 1.2 10*3/uL (ref 0.7–4.0)
MCH: 28.2 pg (ref 26.0–34.0)
MCHC: 31.7 g/dL (ref 30.0–36.0)
MCV: 88.9 fL (ref 80.0–100.0)
Monocytes Absolute: 1.6 10*3/uL — ABNORMAL HIGH (ref 0.1–1.0)
Monocytes Relative: 8 %
Neutro Abs: 17.2 10*3/uL — ABNORMAL HIGH (ref 1.7–7.7)
Neutrophils Relative %: 85 %
Platelets: 310 10*3/uL (ref 150–400)
RBC: 3.87 MIL/uL — ABNORMAL LOW (ref 4.22–5.81)
RDW: 15.8 % — ABNORMAL HIGH (ref 11.5–15.5)
WBC: 20.4 10*3/uL — ABNORMAL HIGH (ref 4.0–10.5)
nRBC: 0 % (ref 0.0–0.2)

## 2023-03-05 LAB — HEMOGLOBIN A1C
Hgb A1c MFr Bld: 6.9 % — ABNORMAL HIGH (ref 4.8–5.6)
Mean Plasma Glucose: 151.33 mg/dL

## 2023-03-05 LAB — URINALYSIS, W/ REFLEX TO CULTURE (INFECTION SUSPECTED)
Bacteria, UA: NONE SEEN
Bilirubin Urine: NEGATIVE
Glucose, UA: NEGATIVE mg/dL
Hgb urine dipstick: NEGATIVE
Ketones, ur: NEGATIVE mg/dL
Leukocytes,Ua: NEGATIVE
Nitrite: NEGATIVE
Protein, ur: 100 mg/dL — AB
Specific Gravity, Urine: 1.023 (ref 1.005–1.030)
pH: 5 (ref 5.0–8.0)

## 2023-03-05 LAB — RESPIRATORY PANEL BY PCR

## 2023-03-05 LAB — MAGNESIUM: Magnesium: 2.4 mg/dL (ref 1.7–2.4)

## 2023-03-05 LAB — SARS CORONAVIRUS 2 BY RT PCR: SARS Coronavirus 2 by RT PCR: NEGATIVE

## 2023-03-05 LAB — LACTIC ACID, PLASMA: Lactic Acid, Venous: 1.3 mmol/L (ref 0.5–1.9)

## 2023-03-05 LAB — TSH: TSH: 0.838 u[IU]/mL (ref 0.350–4.500)

## 2023-03-05 LAB — TROPONIN I (HIGH SENSITIVITY): Troponin I (High Sensitivity): 39 ng/L — ABNORMAL HIGH (ref <18)

## 2023-03-05 LAB — BRAIN NATRIURETIC PEPTIDE: B Natriuretic Peptide: 742.9 pg/mL — ABNORMAL HIGH (ref 0.0–100.0)

## 2023-03-05 LAB — PROCALCITONIN: Procalcitonin: 0.63 ng/mL

## 2023-03-05 LAB — I-STAT CG4 LACTIC ACID, ED: Lactic Acid, Venous: 2.7 mmol/L (ref 0.5–1.9)

## 2023-03-05 LAB — GLUCOSE, CAPILLARY: Glucose-Capillary: 165 mg/dL — ABNORMAL HIGH (ref 70–99)

## 2023-03-05 MED ORDER — LACTATED RINGERS IV BOLUS
500.0000 mL | Freq: Once | INTRAVENOUS | Status: AC
  Administered 2023-03-05: 500 mL via INTRAVENOUS

## 2023-03-05 MED ORDER — AMIODARONE HCL 200 MG PO TABS
200.0000 mg | ORAL_TABLET | Freq: Two times a day (BID) | ORAL | Status: DC
  Administered 2023-03-05 – 2023-03-07 (×5): 200 mg via ORAL
  Filled 2023-03-05 (×5): qty 1

## 2023-03-05 MED ORDER — ZOLPIDEM TARTRATE 5 MG PO TABS
5.0000 mg | ORAL_TABLET | Freq: Every day | ORAL | Status: DC
  Administered 2023-03-05 – 2023-03-07 (×3): 5 mg via ORAL
  Filled 2023-03-05 (×3): qty 1

## 2023-03-05 MED ORDER — ACETAMINOPHEN 650 MG RE SUPP: 650.0000 mg | Freq: Four times a day (QID) | RECTAL | Status: DC | PRN

## 2023-03-05 MED ORDER — ACETAMINOPHEN 325 MG PO TABS
650.0000 mg | ORAL_TABLET | Freq: Four times a day (QID) | ORAL | Status: DC | PRN
  Administered 2023-03-06 – 2023-03-08 (×3): 650 mg via ORAL
  Filled 2023-03-05 (×3): qty 2

## 2023-03-05 MED ORDER — VANCOMYCIN VARIABLE DOSE PER UNSTABLE RENAL FUNCTION (PHARMACIST DOSING)
Status: DC
Start: 2023-03-05 — End: 2023-03-06

## 2023-03-05 MED ORDER — INSULIN ASPART 100 UNIT/ML IJ SOLN
0.0000 [IU] | Freq: Three times a day (TID) | INTRAMUSCULAR | Status: DC
  Administered 2023-03-06: 1 [IU] via SUBCUTANEOUS

## 2023-03-05 MED ORDER — SODIUM CHLORIDE 0.9 % IV SOLN
2.0000 g | Freq: Two times a day (BID) | INTRAVENOUS | Status: DC
  Administered 2023-03-05 (×2): 2 g via INTRAVENOUS
  Filled 2023-03-05 (×2): qty 12.5

## 2023-03-05 MED ORDER — ONDANSETRON HCL 4 MG/2ML IJ SOLN: 4.0000 mg | Freq: Four times a day (QID) | INTRAMUSCULAR | Status: DC | PRN

## 2023-03-05 MED ORDER — MELATONIN 3 MG PO TABS: 3.0000 mg | ORAL_TABLET | Freq: Every evening | ORAL | Status: DC | PRN

## 2023-03-05 MED ORDER — ATORVASTATIN CALCIUM 80 MG PO TABS
80.0000 mg | ORAL_TABLET | Freq: Every day | ORAL | Status: DC
  Administered 2023-03-05 – 2023-03-08 (×4): 80 mg via ORAL
  Filled 2023-03-05 (×3): qty 1
  Filled 2023-03-05: qty 2

## 2023-03-05 MED ORDER — SODIUM CHLORIDE 0.9 % IV SOLN: 1.0000 g | INTRAVENOUS | Status: DC

## 2023-03-05 MED ORDER — OXYMETAZOLINE HCL 0.05 % NA SOLN
1.0000 | Freq: Every day | NASAL | Status: AC
  Administered 2023-03-05 – 2023-03-07 (×3): 1 via NASAL
  Filled 2023-03-05: qty 30
  Filled 2023-03-05: qty 15

## 2023-03-05 MED ORDER — APIXABAN 5 MG PO TABS
5.0000 mg | ORAL_TABLET | Freq: Two times a day (BID) | ORAL | Status: DC
  Administered 2023-03-05 – 2023-03-08 (×7): 5 mg via ORAL
  Filled 2023-03-05 (×7): qty 1

## 2023-03-05 NOTE — ED Provider Notes (Signed)
Patient reports feeling improved.  Blood pressures improved.  No focal abdominal tenderness. However he does have pain with range of motion of right hip. Plan to check lactic acid and will admit.  Patient agreeable w/plan   Zadie Rhine, MD 03/05/23 0030

## 2023-03-05 NOTE — ED Notes (Signed)
RN aware of low BP, cuff malfunction.

## 2023-03-05 NOTE — ED Provider Notes (Signed)
Patient continues to stabilize.  Lactic acid minimally elevated.  He has already receive fluids and antibiotics I personally visualized the hip x-ray is negative for fracture  Discussed with Dr. Arlean Hopping for admission   Zadie Rhine, MD 03/05/23 [phone]

## 2023-03-05 NOTE — H&P (Signed)
History and Physical    Patient: [name] [name] DOB: 05-21-1945 DOA: 03/04/2023 DOS: the patient was seen and examined on 03/05/2023 PCP: [name], MD  Patient coming from: Home via EMS  Chief Complaint:  Chief Complaint  Patient presents with   Trauma   HPI: [name] [name] is a 77 y.o. male with medical history significant of hypertension, coronary artery disease, paroxysmal atrial fibrillation, chronic systolic CHF,s/p CCM/PM, PVD, diabetes mellitus type 2, bladder cancer, CKD stage III/IV who presents after having a fall at home.  Patient reports that he was going up the steps when he thinks that he may have passed out.  Patient did hit his head and reports being on blood thinners.  Patient notes that his wife stated that he was out for approximately 5 minutes prior to coming to.  Patient complained of right shoulder and right hip pain.  Since starting Ozempic 6 weeks ago patient reports that he has been bloated and gassy for which he has not tolerated the medication.  That was stopped 1 week ago.  Patient notes that he has not been feeling well.  Denies having any significant nausea, vomiting, diarrhea, chest pain, leg swelling, or calf pain.  He chronically has postnasal drip and intermittent cough.  In the emergency department patient was noted to be afebrile with pulse elevated to 109, respirations elevated up to 31, blood pressures as low as 88/55 to 119/68, and O2 saturations transiently documented as low as 83%.  Labs since 9/4 are significant for WBC 22.1, hemoglobin 12.5, sodium 131, CO2 21, BUN 39, creatinine 2.52, calcium 8.1, AST 53, ALT 79, total bilirubin 1.4, high-sensitivity troponin 39 ->25, and lactic acid 2.7.  CT angiogram of the head and cervical spine noted no acute intracranial abnormality, small right parietal scalp hematoma, and 19 mm nodule within the right thyroid gland.  Chest x-ray noted cardiomegaly without active disease and chronic  interstitial changes bilaterally.  COVID-19 screening was negative.  Urinalysis showed no signs for infection.  Blood cultures have been obtained.  Patient received 1.5 L of IV fluids, vancomycin, metronidazole, and cefepime.  Repeat lactic 1.3.   Review of Systems: As mentioned in the history of present illness. All other systems reviewed and are negative. Past Medical History:  Diagnosis Date   AICD (automatic cardioverter/defibrillator) present    Arthritis    Bladder cancer (HCC)    Borderline glaucoma    Chronic systolic (congestive) heart failure (HCC)    Coronary artery disease    Diabetes mellitus type 2, controlled (HCC) ORAL MED   Essential hypertension    Heart murmur    Hepatitis    Medical history non-contributory    Paroxysmal A-fib (HCC)    Paroxysmal A-fib (HCC) 08/20/2020   Peripheral vascular disease (HCC)    Pneumonia    Past Surgical History:  Procedure Laterality Date   CARDIOVERSION N/A 08/10/2021   Procedure: CARDIOVERSION;  Surgeon: Dolores Patty, MD;  Location: [facility] ENDOSCOPY;  Service: Cardiovascular;  Laterality: N/A;   CARDIOVERSION N/A 11/09/2022   Procedure: CARDIOVERSION;  Surgeon: Maisie Fus, MD;  Location: [facility] INVASIVE CV LAB;  Service: Cardiovascular;  Laterality: N/A;   CAROTID DUPLEX SCAN  07-26-2010   BILATERAL ICA  STENOSIS 1% - 39%   CCM GENERATOR INSERTION N/A 01/04/2023   Procedure: CCM GENERATOR INSERTION;  Surgeon: Duke Salvia, MD;  Location: [facility] INVASIVE CV LAB;  Service: Cardiovascular;  Laterality: N/A;   CHOLECYSTECTOMY N/A 08/22/2020  Procedure: LAPAROSCOPIC CHOLECYSTECTOMY;  Surgeon: Emelia Loron, MD;  Location: [facility] OR;  Service: General;  Laterality: N/A;   CORONARY ARTERY BYPASS GRAFT N/A 04/08/2017   Procedure: CORONARY ARTERY BYPASS GRAFTING (CABG) TIMES TWO USING LEFT INTERNAL MAMMARY ARTERY AND LEFT SAPHENOUS LEG VEIN HARVESTED ENDOSCOPICALLY.  LEG VEIN ALSO HARVESTED FROM THE RIGHT LEG;  Surgeon: Alleen Borne,  MD;  Location: [facility] OR;  Service: Open Heart Surgery;  Laterality: N/A;   CYSTOSCOPY WITH BIOPSY N/A 08/01/2012   Procedure: CYSTOSCOPY WITH BIOPSY BLADDER BIOPSY   ;  Surgeon: Antony Haste, MD;  Location: [facility];  Service: Urology;  Laterality: N/A;   ERCP N/A 08/21/2020   Procedure: ENDOSCOPIC RETROGRADE CHOLANGIOPANCREATOGRAPHY (ERCP);  Surgeon: Jeani Hawking, MD;  Location: [facility] ENDOSCOPY;  Service: Endoscopy;  Laterality: N/A;   FULGURATION OF BLADDER TUMOR N/A 08/01/2012   Procedure: FULGURATION OF BLADDER TUMOR;  Surgeon: Antony Haste, MD;  Location: [facility];  Service: Urology;  Laterality: N/A;   ICD IMPLANT  11/20/2017   ICD IMPLANT N/A 11/20/2017   Procedure: ICD IMPLANT;  Surgeon: Duke Salvia, MD;  Location: [facility] INVASIVE CV LAB;  Service: Cardiovascular;  Laterality: N/A;   LEFT HEART CATH AND CORONARY ANGIOGRAPHY N/A 03/31/2017   Procedure: LEFT HEART CATH AND CORONARY ANGIOGRAPHY;  Surgeon: Marykay Lex, MD;  Location: [facility] INVASIVE CV LAB;  Service: Cardiovascular;  Laterality: N/A;   LUMBAR LAMINECTOMY/DECOMPRESSION MICRODISCECTOMY Bilateral 06/16/2019   Procedure: Bilateral Lumbar Four-Five Laminectomy and Foraminotomy;  Surgeon: Julio Sicks, MD;  Location: [facility] OR;  Service: Neurosurgery;  Laterality: Bilateral;  posterior   REMOVAL OF STONES  08/21/2020   Procedure: REMOVAL OF STONES;  Surgeon: Jeani Hawking, MD;  Location: [facility] ENDOSCOPY;  Service: Endoscopy;;   RIGHT HEART CATH Right 03/31/2017   Procedure: RIGHT HEART CATH;  Surgeon: Marykay Lex, MD;  Location: [facility] INVASIVE CV LAB;  Service: Cardiovascular;  Laterality: Right;   SPHINCTEROTOMY  08/21/2020   Procedure: SPHINCTEROTOMY;  Surgeon: Jeani Hawking, MD;  Location: [facility] ENDOSCOPY;  Service: Endoscopy;;   TEE WITHOUT CARDIOVERSION N/A 04/08/2017   Procedure: TRANSESOPHAGEAL ECHOCARDIOGRAM (TEE);  Surgeon: Alleen Borne, MD;  Location: [facility] OR;  Service: Open Heart  Surgery;  Laterality: N/A;   TRANSURETHRAL RESECTION OF BLADDER TUMOR  05/29/2011   Procedure: TRANSURETHRAL RESECTION OF BLADDER TUMOR (TURBT);  Surgeon: Antony Haste, MD;  Location: [facility];  Service: Urology;  Laterality: N/A;   VENTRICULAR ASSIST DEVICE INSERTION Right 03/31/2017   Procedure: VENTRICULAR ASSIST DEVICE INSERTION;  Surgeon: Marykay Lex, MD;  Location: [facility] INVASIVE CV LAB;  Service: Cardiovascular;  Laterality: Right;   Social History:  reports that he quit smoking about 5 years ago. His smoking use included cigarettes. He started smoking about 45 years ago. He has a 40 pack-year smoking history. He has never used smokeless tobacco. He reports that he does not currently use alcohol. He reports that he does not use drugs.  Allergies  Allergen Reactions   Spironolactone Other (See Comments)    Painful gynecomastia   Empagliflozin Rash    Rash at the groin Jardiance   Niacin And Related Hives    Family History  Problem Relation Age of Onset   Heart attack Sister    Heart disease Sister     Prior to Admission medications   Medication Sig Start Date End Date Taking? Authorizing Provider  acetaminophen (TYLENOL) 500 MG tablet Take 1,000 mg by mouth every 6 (six)  hours as needed for mild pain (pain score 1-3), headache or fever.   Yes [provider]  amiodarone (PACERONE) 200 MG tablet Take 1 tablet (200 mg total) by mouth 2 (two) times daily. 11/13/22  Yes Andrey Farmer, PA-C  amLODipine (NORVASC) 5 MG tablet Take 1 tablet (5 mg total) by mouth daily. 10/08/22 01/08/24 Yes Bensimhon, Bevelyn Buckles, MD  atorvastatin (LIPITOR) 80 MG tablet TAKE 1 TABLET BY MOUTH EVERY DAY 07/27/22  Yes Bensimhon, Bevelyn Buckles, MD  cyanocobalamin (VITAMIN B12) 250 MCG tablet Take 750 mcg by mouth daily.   Yes [provider]  ELIQUIS 5 MG TABS tablet TAKE 1 TABLET TWICE A DAY 10/03/22  Yes Bensimhon, Bevelyn Buckles, MD  oxymetazoline (DRISTAN) 0.05 %  nasal spray Place 1 spray into both nostrils at bedtime.   Yes [provider]  sacubitril-valsartan (ENTRESTO) 97-103 MG Take 1 tablet by mouth 2 (two) times daily. Take 1/2 tablet BID   Yes [provider]  torsemide (DEMADEX) 20 MG tablet Take 1 tablet (20 mg total) by mouth daily. 11/21/22  Yes Andrey Farmer, PA-C  zolpidem (AMBIEN) 10 MG tablet Take 5 mg by mouth at bedtime. 12/13/22  Yes [provider]  sacubitril-valsartan (ENTRESTO) 49-51 MG Take 1 tablet by mouth 2 (two) times daily. 01/08/23   Bensimhon, Bevelyn Buckles, MD    Physical Exam: Vitals:   03/05/23 0700 03/05/23 0800 03/05/23 0825 03/05/23 0827  BP: 106/66 100/63 106/62   Pulse: 98 (!) 102 (!) 101   Resp: (!) 22 (!) 25 (!) 21   Temp:   98.7 F (37.1 C)   TempSrc:      SpO2: 93% 92% 91% 92%  Weight:      Height:       Exam  Constitutional: Elderly male who appears ill but in no acute distress. Eyes: PERRL, lids and conjunctivae normal ENMT: Mucous membranes are moist. Posterior pharynx clear of any exudate or lesions.Normal dentition.  Neck: normal, supple, no masses, no thyromegaly Respiratory: clear to auscultation bilaterally, no wheezing, no crackles. Normal respiratory effort. No accessory muscle use.  Cardiovascular: Regular rate and rhythm, no murmurs / rubs / gallops. No extremity edema. 2+ pedal pulses. No carotid bruits.  Abdomen: no tenderness, no masses palpated. No hepatosplenomegaly. Bowel sounds positive.  Musculoskeletal: no clubbing / cyanosis. No joint deformity upper and lower extremities. Good ROM, no contractures. Normal muscle tone.  Skin: no rashes, lesions, ulcers. No induration Neurologic: CN 2-12 grossly intact. Sensation intact, DTR normal. Strength 5/5 in all 4.  Psychiatric: Normal judgment and insight. Alert and oriented x 3. Normal mood.   Data Reviewed:  EKG reveals atrial fibrillation at 109 bpm with QTc 546.  Reviewed labs, imaging, and pertinent  records.  Reviewed labs, imaging, and pertinent records as documented. Assessment and Plan:   SIRS Leukocytosis Acute.  WBC elevated 22.1 on admission, with repeat check 20.4.  Chest x-ray showed no significant signs for pneumonia.  Urinalysis showed no signs of infection.  There were no clear signs for acute fracture.  COVID-19 screening was negative.  Unclear cause for white blood cell count.  Patient had been started on empiric antibiotics of vancomycin, metronidazole, and cefepime. -Admit to a progressive bed -Follow-up blood cultures -Check complete respiratory virus panel -Check procalcitonin -Continue empiric antibiotics of vancomycin and cefepime.  De-escalate when medically appropriate  Transient hypotension Initially blood pressures noted to be as low as 88/55.  Patient was bolused IV fluids with improvement in  labs to greater than 65. -Goal MAP greater than 65 -Hold home blood pressure regimen  Fall at home down steps Syncope Patient presents after having fall down steps in his home with reports of loss of consciousness..  CT imaging of the head and cervical spine did not note any acute intercranial or cervical spine abnormality.  X-ray imaging of the pelvis did not show any acute fractures. -Up with assistance -PT to evaluate and treat -Orders placed to interrogate pacemaker and CCM. -Follow-up telemetry  Acute kidney injury superimposed on chronic kidney disease stage IIIb Patient presents with creatinine elevated up to 2.52 with BUN 30.  Baseline creatinine previously noted to be around 2.  Urinalysis did not show any signs for infection, but specific gravity was noted to be at the upper limits of normal.  Patient was given 5 L of normal saline IV fluids in the ED. -Hold possible nephrotoxic agents -Check urine creatinine, urea, sodium -Recheck kidney function in a.m.  Paroxysmal atrial fibrillation on chronic anticoagulation Patient appears to be in atrial fibrillatio,  but relatively rate controlled. -Continue Eliquis, amiodarone  Heart failure with reduced ejection fraction Patient does not appear to be grossly fluid overloaded on physical exam at this time.  Last echocardiogram noted EF to be 25 to 30% with global hypokinesis and grade 2 diastolic dysfunction. -Strict intake and output and daily weights -Check BNP -Initially held diuretics due to hypotension.  S/p CCM and pacemaker -Follow-up interrogation of devices  Controlled diabetes mellitus type 2, without long-term use of insulin On admission glucose was elevated up to 209.  Patient is not on diabetic medication at baseline.  No recent hemoglobin A1c available at this time. -Hypoglycemia protocols -Check hemoglobin A1c -CBGs before every meal is a very sensitive SSI  Thyroid gland nodule Incidental finding on CT angiogram of the head and cervical spine. -Check TSH.  Patient on pulse. -Recommend checking thyroid ultrasound   DVT prophylaxis: Eliquis Advance Care Planning:   Code Status: Full Code   Consults: None   Severity of Illness: The appropriate patient status for this patient is INPATIENT. Inpatient status is judged to be reasonable and necessary in order to provide the required intensity of service to ensure the patient's safety. The patient's presenting symptoms, physical exam findings, and initial radiographic and laboratory data in the context of their chronic comorbidities is felt to place them at high risk for further clinical deterioration. Furthermore, it is not anticipated that the patient will be medically stable for discharge from the hospital within 2 midnights of admission.   * I certify that at the point of admission it is my clinical judgment that the patient will require inpatient hospital care spanning beyond 2 midnights from the point of admission due to high intensity of service, high risk for further deterioration and high frequency of surveillance  required.*  Author: Clydie Braun, MD 03/05/2023 8:43 AM  For on call review [email or web address].

## 2023-03-05 NOTE — Progress Notes (Signed)
Pharmacy Antibiotic Note  [name] [name] is a 77 y.o. male admitted on 03/04/2023 with sepsis.  Pharmacy has been consulted for cefepime and vancomycin dosing. Patient with AKI on CKD. Scr 2.44 baseline appears to be around 2.   Plan: Patient is s/p 2G Vancomycin, trend renal fxn for 24 hours prior to scheduling regimen. Scr is downtrending overnight.  Cefepime 2g q12h (CrCl is borderline but Scr is trending down), antiicipate > 30 tomorrow.  Follow culture data for de-escalation.  Monitor renal function for dose adjustments as indicated.   Height: 6\' 2"  (188 cm) Weight: 91.2 kg (201 lb) IBW/kg (Calculated) : 82.2  Temp (24hrs), Avg:98.7 F (37.1 C), Min:98.1 F (36.7 C), Max:99.6 F (37.6 C)  Recent Labs  Lab 03/04/23 2120 03/05/23 0130 03/05/23 0449 03/05/23 0607  WBC 22.1*  --  20.4*  --   CREATININE 2.52*  --  2.44*  --   LATICACIDVEN  --  2.7*  --  1.3    Estimated Creatinine Clearance: 29.5 mL/min (A) (by C-G formula based on SCr of 2.44 mg/dL (H)).    Allergies  Allergen Reactions   Spironolactone Other (See Comments)    Painful gynecomastia   Empagliflozin Rash    Rash at the groin Jardiance   Niacin And Related Hives    Thank you for allowing pharmacy to be a part of this patient's care.  Estill Batten, PharmD, BCCCP  03/05/2023 11:41 AM

## 2023-03-05 NOTE — Evaluation (Signed)
Occupational Therapy Evaluation Patient Details Name: [name] [name] MRN: [id number] DOB: [birth year] Today's Date: 03/05/2023   History of Present Illness 77 year old male with history of chronic systolic/diastolic heart failure status post ICD, paroxysmal atrial fibrillation complicated by sick sinus syndrome status post pacemaker, is being admitted with SIRS criteria, generalized weakness.  He presented with complaint of single presyncopal episode resulting in a fall in which he hit his head.   Clinical Impression   Patient admitted for the diagnosis above.  PTA he lives with his spouse, who can assist if needed.  Patient remains independent with ADL. iADL and mobility, continues to drive, and needed no AD despite having a RW and a SPC.  Pain to R shoulder and hip are the deficits, he is needing up to Mod A for supine to sit, CGA for HHA mobility to the toilet, and Min A for lower body ADL.  Patient should recover as his pain reduces, therefor no post acute OT is anticipated, but OT will follow in the acute setting.    BP Supine 102/68 Sit: 92/66 Stand 96/62  Patient describes mild dizziness      If plan is discharge home, recommend the following: Assist for transportation;Assistance with cooking/housework;A little help with walking and/or transfers;A little help with bathing/dressing/bathroom    Functional Status Assessment  Patient has had a recent decline in their functional status and demonstrates the ability to make significant improvements in function in a reasonable and predictable amount of time.  Equipment Recommendations  None recommended by OT    Recommendations for Other Services       Precautions / Restrictions Precautions Precautions: Fall Precaution Comments: R shoulder and R hip discomfort post fall.  Xrays have been (-) for fx. Restrictions Weight Bearing Restrictions: No      Mobility Bed Mobility Overal bed mobility: Needs Assistance Bed Mobility:  Supine to Sit, Sit to Supine     Supine to sit: Mod assist Sit to supine: Min assist   General bed mobility comments: difficulty with supine to sit on stretcher because of R shoulder    Transfers Overall transfer level: Needs assistance Equipment used: None Transfers: Sit to/from Stand, Bed to chair/wheelchair/BSC Sit to Stand: Supervision     Step pivot transfers: Contact guard assist, Min assist            Balance Overall balance assessment: Needs assistance Sitting-balance support: Feet supported Sitting balance-Leahy Scale: Fair     Standing balance support: Single extremity supported Standing balance-Leahy Scale: Fair                             ADL either performed or assessed with clinical judgement   ADL       Grooming: Wash/dry hands;Contact guard assist;Standing               Lower Body Dressing: Minimal assistance;Sit to/from stand   Toilet Transfer: Contact guard assist;Minimal Holiday representative;Ambulation Toilet Transfer Details (indicate cue type and reason): HHA                 Vision Baseline Vision/History: 1 Wears glasses Patient Visual Report: No change from baseline       Perception Perception: Within Functional Limits       Praxis Praxis: WFL       Pertinent Vitals/Pain Pain Assessment Pain Assessment: Faces Faces Pain Scale: Hurts even more Pain Location: R shoulder > than R hip Pain Descriptors / Indicators:  Tender, Discomfort, Grimacing, Guarding Pain Intervention(s): Monitored during session     Extremity/Trunk Assessment Upper Extremity Assessment Upper Extremity Assessment: Right hand dominant;RUE deficits/detail RUE: Shoulder pain with ROM RUE Sensation: WNL RUE Coordination: WNL   Lower Extremity Assessment Lower Extremity Assessment: Defer to PT evaluation   Cervical / Trunk Assessment Cervical / Trunk Assessment: Kyphotic   Communication Communication Communication: No  apparent difficulties   Cognition Arousal: Alert Behavior During Therapy: WFL for tasks assessed/performed Overall Cognitive Status: Within Functional Limits for tasks assessed                                       General Comments   O2 sats 99% on RA    Exercises     Shoulder Instructions      Home Living Family/patient expects to be discharged to:: Private residence Living Arrangements: Spouse/significant other Available Help at Discharge: Family;Available 24 hours/day Type of Home: House Home Access: Stairs to enter [employer] of Steps: 2 Entrance Stairs-Rails: None Home Layout: Multi-level;Bed/bath upstairs Alternate Level Stairs-Number of Steps: flight Alternate Level Stairs-Rails: Left Bathroom Shower/Tub: Producer, television/film/video: Standard Bathroom Accessibility: Yes How Accessible: Accessible via walker Home Equipment: Rolling Walker (2 wheels);Cane - single point;Shower seat          Prior Functioning/Environment Prior Level of Function : Independent/Modified Independent;Driving                        OT Problem List: Decreased range of motion;Decreased strength;Impaired balance (sitting and/or standing);Pain      OT Treatment/Interventions: Self-care/ADL training;Therapeutic activities;Balance training;DME and/or AE instruction;Patient/family education    OT Goals(Current goals can be found in the care plan section) Acute Rehab OT Goals Patient Stated Goal: Return home today OT Goal Formulation: With patient Time For Goal Achievement: 03/19/23 Potential to Achieve Goals: Good  OT Frequency: Min 1X/week    Co-evaluation              AM-PAC OT "6 Clicks" Daily Activity     Outcome Measure Help from another person eating meals?: None Help from another person taking care of personal grooming?: A Little Help from another person toileting, which includes using toliet, bedpan, or urinal?: A Little Help  from another person bathing (including washing, rinsing, drying)?: A Little Help from another person to put on and taking off regular upper body clothing?: A Little Help from another person to put on and taking off regular lower body clothing?: A Little 6 Click Score: 19   End of Session Equipment Utilized During Treatment: Gait belt Nurse Communication: Mobility status  Activity Tolerance: Patient tolerated treatment well Patient left: in bed;with call bell/phone within reach  OT Visit Diagnosis: Unsteadiness on feet (R26.81);History of falling (Z91.81);Pain Pain - Right/Left: Right Pain - part of body: Shoulder;Hip                Time: 2440-1027 OT Time Calculation (min): 23 min Charges:  OT General Charges $OT Visit: 1 Visit OT Evaluation $OT Eval Moderate Complexity: 1 Mod OT Treatments $Self Care/Home Management : 8-22 mins  03/05/2023  RP, OTR/L  Acute Rehabilitation Services  Office:  [phone]   [name] 03/05/2023, 4:26 PM

## 2023-03-05 NOTE — Sepsis Progress Note (Signed)
Notified provider of need to order repeat lactic acid. ° °

## 2023-03-05 NOTE — Progress Notes (Addendum)
Carryover admission to the Day Admitter.  I discussed this case with the EDP, Dr. Bebe Shaggy.  Per these discussions:   This is a 77 year old male with history of chronic systolic/diastolic heart failure status post ICD, paroxysmal atrial fibrillation complicated by sick sinus syndrome status post pacemaker, is being admitted with SIRS criteria, generalized weakness.  He presented with complaint of single presyncopal episode resulting in a fall in which he hit his head.  He is chronically anticoagulated in the setting of paroxysmal atrial fibrillation.  CT head/neck showed no evidence of acute process.  He conveys generalized weakness over the last 2 3 days in the absence of any acute focal weakness.  This is also associated with subjective fever and chills over that timeframe.  Not complaining of any abdominal discomfort, and EDP conveys no meningeal signs.  No chest pain.  Vital signs notable for mild tachycardia.  He has elevated white blood cell count of 22,000 as well as elevated initial lactic acid of 2.7, with repeat pending at this time. He has received two 500 cc ivf boluses.  Chest x-ray showed no evidence of acute process.  The patient was also complaining of some unilateral hip pain after his fall.  Plain films of the hip showed no evidence of acute fracture.  Urinalysis has been ordered, Therazole currently pending.  Blood cultures x 2 were collected followed by receipt of IV vancomycin, cefepime, and Flagyl.  I have placed an order for inpatient admission for further evaluation management of SIRS criteria without clear source, as further detailed above.  I have placed some additional preliminary admit orders via the adult multi-morbid admission order set. I have also ordered COVID-19 PCR.  While awaiting result urinalysis, and ordered Rocephin, and will defer additional decision making regarding ensuing antibiotics to the admitting hospitalist.  For his generalized weakness I ordered  fall precautions as well as PT/OT consults.  Also ordered morning labs in the form of CMP, CBC and serum magnesium level.    [name] Pigg, DO Hospitalist

## 2023-03-05 NOTE — Sepsis Progress Note (Signed)
Elink monitoring for the code sepsis protocol.  

## 2023-03-05 NOTE — TOC CAGE-AID Note (Signed)
Transition of Care [facility]) - CAGE-AID Screening   Patient Details  Name: [name] [name] MRN: [id number] Date of Birth: 04-15-1946  Transition of Care [facility]) CM/SW Contact:    Katha Hamming, RN Phone Number: 03/05/2023, 1:08 AM     CAGE-AID Screening:    Have You Ever Felt You Ought to Cut Down on Your Drinking or Drug Use?: No Have People Annoyed You By Critizing Your Drinking Or Drug Use?: No Have You Felt Bad Or Guilty About Your Drinking Or Drug Use?: No Have You Ever Had a Drink or Used Drugs First Thing In The Morning to Steady Your Nerves or to Get Rid of a Hangover?: No CAGE-AID Score: 0  Substance Abuse Education Offered: No

## 2023-03-05 NOTE — ED Notes (Signed)
ED TO INPATIENT HANDOFF REPORT  ED Nurse Name and Phone #: Marcelino Duster [phone]  S Name/Age/Gender [name] [name] 77 y.o. male Room/Bed: 040C/040C  Code Status   Code Status: Full Code  Home/SNF/Other Home Patient oriented to: self, place, time, and situation Is this baseline? Yes   Triage Complete: Triage complete  Chief Complaint SIRS (systemic inflammatory response syndrome) (HCC) [R65.10]  Triage Note Pt bib [facility] coming from home after pt had syncopal episode and fell down stairs. He did hit his head. Hematoma noted to top right side of head. Pt is on eliquis (took at 1900), pt has pacemaker and CABG. EMS reports pt did have syncopal episode yesterday as well. EMS reports that patient stopped taking Ozempic last week and has been having syncopal episodes since then. EMS notes minor skin tear to right hand as well. GCS 15.   EMS vitals: 90/60 initally -  118/58 manual bp 90s HR 99% RA 258cbg Normal Saline given    Allergies Allergies  Allergen Reactions   Spironolactone Other (See Comments)    Painful gynecomastia   Empagliflozin Rash    Rash at the groin Jardiance   Niacin And Related Hives    Level of Care/Admitting Diagnosis ED Disposition     ED Disposition  Admit   Condition  --   Comment  Hospital Area: [facility] [[id number]]  Level of Care: Progressive [102]  Admit to Progressive based on following criteria: MULTISYSTEM THREATS such as stable sepsis, metabolic/electrolyte imbalance with or without encephalopathy that is responding to early treatment.  May admit patient to [facility] or [facility] if equivalent level of care is available:: No  Covid Evaluation: Asymptomatic - no recent exposure (last 10 days) testing not required  Diagnosis: SIRS (systemic inflammatory response syndrome) [facility]) [[id number]]  Admitting Physician: Angie Fava [[id number]]  Attending Physician: Angie Fava [[id number]]  Certification:: I certify  this patient will need inpatient services for at least 2 midnights  Expected Medical Readiness: 03/07/2023          B Medical/Surgery History Past Medical History:  Diagnosis Date   AICD (automatic cardioverter/defibrillator) present    Arthritis    Bladder cancer (HCC)    Borderline glaucoma    Chronic systolic (congestive) heart failure (HCC)    Coronary artery disease    Diabetes mellitus type 2, controlled (HCC) [facility]   Essential hypertension    Heart murmur    Hepatitis    Medical history non-contributory    Paroxysmal A-fib (HCC)    Paroxysmal A-fib (HCC) 08/20/2020   Peripheral vascular disease (HCC)    Pneumonia    Past Surgical History:  Procedure Laterality Date   CARDIOVERSION N/A 08/10/2021   Procedure: CARDIOVERSION;  Surgeon: Dolores Patty, MD;  Location: [facility] ENDOSCOPY;  Service: Cardiovascular;  Laterality: N/A;   CARDIOVERSION N/A 11/09/2022   Procedure: CARDIOVERSION;  Surgeon: Maisie Fus, MD;  Location: [facility] INVASIVE CV LAB;  Service: Cardiovascular;  Laterality: N/A;   CAROTID DUPLEX SCAN  07-26-2010   BILATERAL ICA  STENOSIS 1% - 39%   CCM GENERATOR INSERTION N/A 01/04/2023   Procedure: CCM GENERATOR INSERTION;  Surgeon: Duke Salvia, MD;  Location: [facility] INVASIVE CV LAB;  Service: Cardiovascular;  Laterality: N/A;   CHOLECYSTECTOMY N/A 08/22/2020   Procedure: LAPAROSCOPIC CHOLECYSTECTOMY;  Surgeon: Emelia Loron, MD;  Location: [facility] OR;  Service: General;  Laterality: N/A;   CORONARY ARTERY BYPASS GRAFT N/A 04/08/2017   Procedure: CORONARY ARTERY BYPASS  GRAFTING (CABG) TIMES TWO USING LEFT INTERNAL MAMMARY ARTERY AND LEFT SAPHENOUS LEG VEIN HARVESTED ENDOSCOPICALLY.  LEG VEIN ALSO HARVESTED FROM THE RIGHT LEG;  Surgeon: Alleen Borne, MD;  Location: [facility] OR;  Service: Open Heart Surgery;  Laterality: N/A;   CYSTOSCOPY WITH BIOPSY N/A 08/01/2012   Procedure: CYSTOSCOPY WITH BIOPSY BLADDER BIOPSY   ;  Surgeon: Antony Haste, MD;  Location:  [facility];  Service: Urology;  Laterality: N/A;   ERCP N/A 08/21/2020   Procedure: ENDOSCOPIC RETROGRADE CHOLANGIOPANCREATOGRAPHY (ERCP);  Surgeon: Jeani Hawking, MD;  Location: [facility] ENDOSCOPY;  Service: Endoscopy;  Laterality: N/A;   FULGURATION OF BLADDER TUMOR N/A 08/01/2012   Procedure: FULGURATION OF BLADDER TUMOR;  Surgeon: Antony Haste, MD;  Location: [facility];  Service: Urology;  Laterality: N/A;   ICD IMPLANT  11/20/2017   ICD IMPLANT N/A 11/20/2017   Procedure: ICD IMPLANT;  Surgeon: Duke Salvia, MD;  Location: [facility] INVASIVE CV LAB;  Service: Cardiovascular;  Laterality: N/A;   LEFT HEART CATH AND CORONARY ANGIOGRAPHY N/A 03/31/2017   Procedure: LEFT HEART CATH AND CORONARY ANGIOGRAPHY;  Surgeon: Marykay Lex, MD;  Location: [facility] INVASIVE CV LAB;  Service: Cardiovascular;  Laterality: N/A;   LUMBAR LAMINECTOMY/DECOMPRESSION MICRODISCECTOMY Bilateral 06/16/2019   Procedure: Bilateral Lumbar Four-Five Laminectomy and Foraminotomy;  Surgeon: Julio Sicks, MD;  Location: [facility] OR;  Service: Neurosurgery;  Laterality: Bilateral;  posterior   REMOVAL OF STONES  08/21/2020   Procedure: REMOVAL OF STONES;  Surgeon: Jeani Hawking, MD;  Location: [facility] ENDOSCOPY;  Service: Endoscopy;;   RIGHT HEART CATH Right 03/31/2017   Procedure: RIGHT HEART CATH;  Surgeon: Marykay Lex, MD;  Location: [facility] INVASIVE CV LAB;  Service: Cardiovascular;  Laterality: Right;   SPHINCTEROTOMY  08/21/2020   Procedure: SPHINCTEROTOMY;  Surgeon: Jeani Hawking, MD;  Location: [facility] ENDOSCOPY;  Service: Endoscopy;;   TEE WITHOUT CARDIOVERSION N/A 04/08/2017   Procedure: TRANSESOPHAGEAL ECHOCARDIOGRAM (TEE);  Surgeon: Alleen Borne, MD;  Location: [facility] OR;  Service: Open Heart Surgery;  Laterality: N/A;   TRANSURETHRAL RESECTION OF BLADDER TUMOR  05/29/2011   Procedure: TRANSURETHRAL RESECTION OF BLADDER TUMOR (TURBT);  Surgeon: Antony Haste, MD;  Location: [facility];  Service: Urology;  Laterality: N/A;   VENTRICULAR ASSIST DEVICE INSERTION Right 03/31/2017   Procedure: VENTRICULAR ASSIST DEVICE INSERTION;  Surgeon: Marykay Lex, MD;  Location: [facility] INVASIVE CV LAB;  Service: Cardiovascular;  Laterality: Right;     A IV Location/Drains/Wounds Patient Lines/Drains/Airways Status     Active Line/Drains/Airways     Name Placement date Placement time Site Days   Peripheral IV 03/04/23 20 G Left Antecubital 03/04/23  2126  Antecubital  1   Incision - 4 Ports Abdomen 1: Right 2: Right 3: Mid;Upper 4: Mid 08/22/20  1240  -- 925   Wound / Incision (Open or Dehisced) 08/21/20 Skin tear Elbow Left;Posterior 08/21/20  0831  Elbow  926            Intake/Output Last 24 hours  Intake/Output Summary (Last 24 hours) at 03/05/2023 1725 Last data filed at 03/05/2023 1303 Gross per 24 hour  Intake 2201.41 ml  Output 325 ml  Net 1876.41 ml    Labs/Imaging Results for orders placed or performed during the hospital encounter of 03/04/23 (from the past 48 hour(s))  Troponin I (High Sensitivity)     Status: Abnormal   Collection Time: 03/04/23 12:15 AM  Result Value Ref Range   Troponin  I (High Sensitivity) 39 (H) <18 ng/L    Comment: (NOTE) Elevated high sensitivity troponin I (hsTnI) values and significant  changes across serial measurements may suggest ACS but many other  chronic and acute conditions are known to elevate hsTnI results.  Refer to the "Links" section for chest pain algorithms and additional  guidance. Performed at [facility], [address] [place], [place] [address]   Comprehensive metabolic panel     Status: Abnormal   Collection Time: 03/04/23  9:20 PM  Result Value Ref Range   Sodium 131 (L) 135 - 145 mmol/L   Potassium 4.3 3.5 - 5.1 mmol/L   Chloride 99 98 - 111 mmol/L   CO2 21 (L) 22 - 32 mmol/L   Glucose, Bld 209 (H) 70 - 99 mg/dL    Comment: Glucose reference range applies only to samples taken after  fasting for at least 8 hours.   BUN 39 (H) 8 - 23 mg/dL   Creatinine, Ser 1.91 (H) 0.61 - 1.24 mg/dL   Calcium 8.1 (L) 8.9 - 10.3 mg/dL   Total Protein 5.9 (L) 6.5 - 8.1 g/dL   Albumin 2.7 (L) 3.5 - 5.0 g/dL   AST 53 (H) 15 - 41 U/L   ALT 79 (H) 0 - 44 U/L   Alkaline Phosphatase 84 38 - 126 U/L   Total Bilirubin 1.4 (H) <1.2 mg/dL   GFR, Estimated 26 (L) >60 mL/min    Comment: (NOTE) Calculated using the CKD-EPI Creatinine Equation (2021)    Anion gap 11 5 - 15    Comment: Performed at [facility], [address] [place], [place] [address]  Troponin I (High Sensitivity)     Status: Abnormal   Collection Time: 03/04/23  9:20 PM  Result Value Ref Range   Troponin I (High Sensitivity) 25 (H) <18 ng/L    Comment: (NOTE) Elevated high sensitivity troponin I (hsTnI) values and significant  changes across serial measurements may suggest ACS but many other  chronic and acute conditions are known to elevate hsTnI results.  Refer to the "Links" section for chest pain algorithms and additional  guidance. Performed at [facility], [address], [place], [place] [address]   CBC     Status: Abnormal   Collection Time: 03/04/23  9:20 PM  Result Value Ref Range   WBC 22.1 (H) 4.0 - 10.5 K/uL   RBC 4.41 4.22 - 5.81 MIL/uL   Hemoglobin 12.5 (L) 13.0 - 17.0 g/dL   HCT 08.6 57.8 - 46.9 %   MCV 88.4 80.0 - 100.0 fL   MCH 28.3 26.0 - 34.0 pg   MCHC 32.1 30.0 - 36.0 g/dL   RDW 62.9 (H) 52.8 - 41.3 %   Platelets 336 150 - 400 K/uL   nRBC 0.0 0.0 - 0.2 %    Comment: Performed at [facility], [address] [place], [place] [address]  Culture, blood (routine x 2)     Status: None (Preliminary result)   Collection Time: 03/04/23 10:46 PM   Specimen: BLOOD RIGHT FOREARM  Result Value Ref Range   Specimen Description BLOOD RIGHT FOREARM    Special Requests      BOTTLES DRAWN AEROBIC AND ANAEROBIC Blood Culture results may not be optimal due to an excessive volume of blood  received in culture bottles   Culture      NO GROWTH < 24 HOURS Performed at [facility], [address], [place], [place] [address]    Report Status PENDING   I-Stat Lactic Acid     Status: Abnormal   Collection Time: 03/05/23  1:30 AM  Result Value Ref Range   Lactic Acid, Venous 2.7 (HH) 0.5 - 1.9 mmol/L   Comment NOTIFIED PHYSICIAN   SARS Coronavirus 2 by RT PCR (hospital order, performed in [facility] hospital lab) *cepheid single result test* Anterior Nasal Swab     Status: None   Collection Time: 03/05/23  2:11 AM   Specimen: Anterior Nasal Swab  Result Value Ref Range   SARS Coronavirus 2 by RT PCR NEGATIVE NEGATIVE    Comment: Performed at [facility], [address], [place], [place] [address]  Urinalysis, w/ Reflex to Culture (Infection Suspected) -Urine, Unspecified Source     Status: Abnormal   Collection Time: 03/05/23  2:50 AM  Result Value Ref Range   Specimen Source URINE, UNSPE    Color, Urine YELLOW YELLOW   APPearance CLEAR CLEAR   Specific Gravity, Urine 1.023 1.005 - 1.030   pH 5.0 5.0 - 8.0   Glucose, UA NEGATIVE NEGATIVE mg/dL   Hgb urine dipstick NEGATIVE NEGATIVE   Bilirubin Urine NEGATIVE NEGATIVE   Ketones, ur NEGATIVE NEGATIVE mg/dL   Protein, ur 130 (A) NEGATIVE mg/dL   Nitrite NEGATIVE NEGATIVE   Leukocytes,Ua NEGATIVE NEGATIVE   RBC / HPF 0-5 0 - 5 RBC/hpf   WBC, UA 0-5 0 - 5 WBC/hpf    Comment:        Reflex urine culture not performed if WBC <=10, OR if Squamous epithelial cells >5. If Squamous epithelial cells >5 suggest recollection.    Bacteria, UA NONE SEEN NONE SEEN   Squamous Epithelial / HPF 0-5 0 - 5 /HPF   Mucus PRESENT    Hyaline Casts, UA PRESENT     Comment: Performed at [facility], [address], [place], [place] [address]  CBC with Differential/Platelet     Status: Abnormal   Collection Time: 03/05/23  4:49 AM  Result Value Ref Range   WBC 20.4 (H) 4.0 - 10.5 K/uL   RBC 3.87 (L) 4.22 - 5.81 MIL/uL    Hemoglobin 10.9 (L) 13.0 - 17.0 g/dL   HCT 46.9 (L) 62.9 - 52.8 %   MCV 88.9 80.0 - 100.0 fL   MCH 28.2 26.0 - 34.0 pg   MCHC 31.7 30.0 - 36.0 g/dL   RDW 41.3 (H) 24.4 - 01.0 %   Platelets 310 150 - 400 K/uL   nRBC 0.0 0.0 - 0.2 %   Neutrophils Relative % 85 %   Neutro Abs 17.2 (H) 1.7 - 7.7 K/uL   Lymphocytes Relative 6 %   Lymphs Abs 1.2 0.7 - 4.0 K/uL   Monocytes Relative 8 %   Monocytes Absolute 1.6 (H) 0.1 - 1.0 K/uL   Eosinophils Relative 0 %   Eosinophils Absolute 0.1 0.0 - 0.5 K/uL   Basophils Relative 0 %   Basophils Absolute 0.1 0.0 - 0.1 K/uL   Immature Granulocytes 1 %   Abs Immature Granulocytes 0.23 (H) 0.00 - 0.07 K/uL    Comment: Performed at [facility], [address], [place], [place] [address]  Comprehensive metabolic panel     Status: Abnormal   Collection Time: 03/05/23  4:49 AM  Result Value Ref Range   Sodium 130 (L) 135 - 145 mmol/L   Potassium 4.1 3.5 - 5.1 mmol/L    Comment: HEMOLYSIS  AT THIS LEVEL MAY AFFECT RESULT   Chloride 102 98 - 111 mmol/L   CO2 21 (L) 22 - 32 mmol/L   Glucose, Bld 167 (H) 70 - 99 mg/dL    Comment: Glucose reference range applies only to samples taken after fasting for at least 8 hours.   BUN 35 (H) 8 - 23 mg/dL   Creatinine, Ser 4.09 (H) 0.61 - 1.24 mg/dL   Calcium 7.5 (L) 8.9 - 10.3 mg/dL   Total Protein 5.1 (L) 6.5 - 8.1 g/dL   Albumin 2.3 (L) 3.5 - 5.0 g/dL   AST 49 (H) 15 - 41 U/L    Comment: HEMOLYSIS AT THIS LEVEL MAY AFFECT RESULT   ALT 71 (H) 0 - 44 U/L    Comment: HEMOLYSIS AT THIS LEVEL MAY AFFECT RESULT   Alkaline Phosphatase 70 38 - 126 U/L   Total Bilirubin 1.5 (H) <1.2 mg/dL    Comment: HEMOLYSIS AT THIS LEVEL MAY AFFECT RESULT   GFR, Estimated 27 (L) >60 mL/min    Comment: (NOTE) Calculated using the CKD-EPI Creatinine Equation (2021)    Anion gap 7 5 - 15    Comment: Performed at [facility], [address], [place], [place] [address]  Magnesium     Status: None   Collection Time:  03/05/23  4:49 AM  Result Value Ref Range   Magnesium 2.4 1.7 - 2.4 mg/dL    Comment: Performed at [facility], [address], [place], [place] [address]  Brain natriuretic peptide     Status: Abnormal   Collection Time: 03/05/23  4:49 AM  Result Value Ref Range   B Natriuretic Peptide 742.9 (H) 0.0 - 100.0 pg/mL    Comment: Performed at [facility], [address] [place], [place] [address]  Procalcitonin     Status: None   Collection Time: 03/05/23  4:49 AM  Result Value Ref Range   Procalcitonin 0.63 ng/mL    Comment:        Interpretation: PCT > 0.5 ng/mL and <= 2 ng/mL: Systemic infection (sepsis) is possible, but other conditions are known to elevate PCT as well. (NOTE)       Sepsis PCT Algorithm           Lower Respiratory Tract                                      Infection PCT Algorithm    ----------------------------     ----------------------------         PCT < 0.25 ng/mL                PCT < 0.10 ng/mL          Strongly encourage             Strongly discourage   discontinuation of antibiotics    initiation of antibiotics    ----------------------------     -----------------------------       PCT 0.25 - 0.50 ng/mL            PCT 0.10 - 0.25 ng/mL               OR       >80% decrease in PCT            Discourage initiation of  antibiotics      Encourage discontinuation           of antibiotics    ----------------------------     -----------------------------         PCT >= 0.50 ng/mL              PCT 0.26 - 0.50 ng/mL                AND       <80% decrease in PCT             Encourage initiation of                                             antibiotics       Encourage continuation           of antibiotics    ----------------------------     -----------------------------        PCT >= 0.50 ng/mL                  PCT > 0.50 ng/mL               AND         increase in PCT                  Strongly  encourage                                      initiation of antibiotics    Strongly encourage escalation           of antibiotics                                     -----------------------------                                           PCT <= 0.25 ng/mL                                                 OR                                        > 80% decrease in PCT                                      Discontinue / Do not initiate                                             antibiotics  Performed at [facility], [address], [place], [place] [address]   TSH     Status: None   Collection Time:  03/05/23  4:49 AM  Result Value Ref Range   TSH 0.838 0.350 - 4.500 uIU/mL    Comment: Performed by a 3rd Generation assay with a functional sensitivity of <=0.01 uIU/mL. Performed at [facility], [address], [place], [place] [address]   Lactic acid, plasma     Status: None   Collection Time: 03/05/23  6:07 AM  Result Value Ref Range   Lactic Acid, Venous 1.3 0.5 - 1.9 mmol/L    Comment: Performed at [facility], [address], [place], [place] [address]   DG Hip Lucienne Capers or Wo Pelvis 1 View Right  Result Date: 03/05/2023 CLINICAL DATA:  Multiple falls, right hip pain EXAM: DG HIP (WITH OR WITHOUT PELVIS) 1V RIGHT COMPARISON:  12/15/2018 FINDINGS: Mild symmetric degenerative changes in the hips with spurring. SI joints symmetric. No acute bony abnormality. Specifically, no fracture, subluxation, or dislocation. IMPRESSION: No acute bony abnormality. Electronically Signed   By: Charlett Nose M.D.   On: 03/05/2023 00:59   CT HEAD WO CONTRAST ( )  Result Date: 03/04/2023 CLINICAL DATA:  Head trauma, minor (Age >= 65y); Neck trauma (Age >= 65y), fall, chronic anticoagulation EXAM: CT HEAD WITHOUT CONTRAST CT CERVICAL SPINE WITHOUT CONTRAST TECHNIQUE: Multidetector CT imaging of the head and cervical spine was performed following the standard protocol without  intravenous contrast. Multiplanar CT image reconstructions of the cervical spine were also generated. RADIATION DOSE REDUCTION: This exam was performed according to the departmental dose-optimization program which includes automated exposure control, adjustment of the mA and/or kV according to patient size and/or use of iterative reconstruction technique. COMPARISON:  None Available. FINDINGS: CT HEAD FINDINGS Brain: No evidence of acute infarction, hemorrhage, hydrocephalus, extra-axial collection or mass lesion/mass effect. Vascular: No hyperdense vessel or unexpected calcification. Skull: Normal. Negative for fracture or focal lesion. Sinuses/Orbits: No acute finding. Other: Mastoid air cells and middle ear cavities are clear. Small right parietal scalp hematoma. CT CERVICAL SPINE FINDINGS Alignment: Normal. Skull base and vertebrae: Craniocervical alignment is normal. The atlantodental interval is not widened. No acute fracture of the cervical spine. Vertebral body height is preserved. Soft tissues and spinal canal: No prevertebral fluid or swelling. No visible canal hematoma. Disc levels: There is endplate remodeling throughout the cervical spine in keeping with changes of diffuse moderate degenerative disc disease. Prevertebral soft tissues are not thickened on sagittal reformats. The spinal canal is widely patent. No significant neuroforaminal narrowing. Upper chest: 19 mm nodule is seen within the right thyroid gland demonstrating punctate internal calcification and heterogeneous internal enhancement. This is not well characterized on this examination. Visualized lung apices demonstrate at least mild emphysematous. Other: None IMPRESSION: 1. No acute intracranial abnormality. No calvarial fracture. Small right parietal scalp hematoma. 2. No acute fracture or listhesis of the cervical spine. 3. 19 mm nodule within the right thyroid gland, not well characterized on this examination. Recommend thyroid US (ref:  J Am Coll Radiol. 2015 Feb;12(2): 143-50). Emphysema (ICD10-J43.9). Electronically Signed   By: Helyn Numbers M.D.   On: 03/04/2023 21:45   CT Cervical Spine Wo Contrast  Result Date: 03/04/2023 CLINICAL DATA:  Head trauma, minor (Age >= 65y); Neck trauma (Age >= 65y), fall, chronic anticoagulation EXAM: CT HEAD WITHOUT CONTRAST CT CERVICAL SPINE WITHOUT CONTRAST TECHNIQUE: Multidetector CT imaging of the head and cervical spine was performed following the standard protocol without intravenous contrast. Multiplanar CT image reconstructions of the cervical spine were also generated. RADIATION DOSE REDUCTION: This exam was performed  according to the departmental dose-optimization program which includes automated exposure control, adjustment of the mA and/or kV according to patient size and/or use of iterative reconstruction technique. COMPARISON:  None Available. FINDINGS: CT HEAD FINDINGS Brain: No evidence of acute infarction, hemorrhage, hydrocephalus, extra-axial collection or mass lesion/mass effect. Vascular: No hyperdense vessel or unexpected calcification. Skull: Normal. Negative for fracture or focal lesion. Sinuses/Orbits: No acute finding. Other: Mastoid air cells and middle ear cavities are clear. Small right parietal scalp hematoma. CT CERVICAL SPINE FINDINGS Alignment: Normal. Skull base and vertebrae: Craniocervical alignment is normal. The atlantodental interval is not widened. No acute fracture of the cervical spine. Vertebral body height is preserved. Soft tissues and spinal canal: No prevertebral fluid or swelling. No visible canal hematoma. Disc levels: There is endplate remodeling throughout the cervical spine in keeping with changes of diffuse moderate degenerative disc disease. Prevertebral soft tissues are not thickened on sagittal reformats. The spinal canal is widely patent. No significant neuroforaminal narrowing. Upper chest: 19 mm nodule is seen within the right thyroid gland  demonstrating punctate internal calcification and heterogeneous internal enhancement. This is not well characterized on this examination. Visualized lung apices demonstrate at least mild emphysematous. Other: None IMPRESSION: 1. No acute intracranial abnormality. No calvarial fracture. Small right parietal scalp hematoma. 2. No acute fracture or listhesis of the cervical spine. 3. 19 mm nodule within the right thyroid gland, not well characterized on this examination. Recommend thyroid US (ref: J Am Coll Radiol. 2015 Feb;12(2): 143-50). Emphysema (ICD10-J43.9). Electronically Signed   By: Helyn Numbers M.D.   On: 03/04/2023 21:45   DG Chest Portable 1 View  Result Date: 03/04/2023 CLINICAL DATA:  Syncope, fall. EXAM: PORTABLE CHEST 1 VIEW COMPARISON:  01/16/2023. FINDINGS: The heart is enlarged and the mediastinal contour is within normal limits. There is atherosclerotic calcification of the aorta. Interstitial prominence is present bilaterally and likely chronic. No consolidation, effusion, or pneumothorax. Multi lead pacemaker devices are present bilaterally. Sternotomy wires are noted. No acute osseous abnormality is seen. IMPRESSION: 1. No active disease. 2. Cardiomegaly. 3. Chronic interstitial changes bilaterally. Electronically Signed   By: Thornell Sartorius M.D.   On: 03/04/2023 21:44    Pending Labs Unresulted Labs (From admission, onward)     Start     Ordered   03/05/23 1114  Respiratory (~20 pathogens) panel by PCR  (Respiratory panel by PCR (~20 pathogens, ~24 hr TAT)  w precautions)  Once,   R        03/05/23 1113   03/04/23 2154  Culture, blood (routine x 2)  BLOOD CULTURE X 2,   R (with STAT occurrences)      03/04/23 2153            Vitals/Pain Today's Vitals   03/05/23 1015 03/05/23 1253 03/05/23 1632 03/05/23 1635  BP: 112/65 107/64 119/64   Pulse: 92 98 97   Resp: (!) 22 (!) 24 18   Temp:  98.2 F (36.8 C)  98 F (36.7 C)  TempSrc:  Oral Oral Oral  SpO2: 96% 95% 98%    Weight:      Height:      PainSc:        Isolation Precautions Droplet precaution  Medications Medications  acetaminophen (TYLENOL) tablet 650 mg (has no administration in time range)    Or  acetaminophen (TYLENOL) suppository 650 mg (has no administration in time range)  melatonin tablet 3 mg (has no administration in time range)  ondansetron (ZOFRAN) injection 4  mg (has no administration in time range)  amiodarone (PACERONE) tablet 200 mg (200 mg Oral Given 03/05/23 1241)  atorvastatin (LIPITOR) tablet 80 mg (80 mg Oral Given 03/05/23 1242)  zolpidem (AMBIEN) tablet 5 mg (has no administration in time range)  apixaban (ELIQUIS) tablet 5 mg (5 mg Oral Given 03/05/23 1242)  oxymetazoline (AFRIN) 0.05 % nasal spray 1 spray (has no administration in time range)  ceFEPIme (MAXIPIME) 2 g in sodium chloride 0.9 % 100 mL IVPB (0 g Intravenous Stopped 03/05/23 1319)  vancomycin variable dose per unstable renal function (pharmacist dosing) (has no administration in time range)  sodium chloride 0.9 % bolus 500 mL (0 mLs Intravenous Stopped 03/05/23 0001)  lactated ringers bolus 500 mL (0 mLs Intravenous Stopped 03/05/23 0131)  ceFEPIme (MAXIPIME) 2 g in sodium chloride 0.9 % 100 mL IVPB (0 g Intravenous Stopped 03/05/23 0001)  metroNIDAZOLE (FLAGYL) IVPB 500 mg (0 mg Intravenous Stopped 03/05/23 0131)  vancomycin (VANCOREADY) IVPB 2000 mg/400 mL (0 mg Intravenous Stopped 03/05/23 0354)  lactated ringers bolus 500 mL (0 mLs Intravenous Stopped 03/05/23 0514)    Mobility walks with person assist and device     Focused Assessments Neuro:  Alert and Oriented Musc/Skel: Generalized weakness Skin:  hematoma to top of scalp, skin tear to hand.    R Recommendations: See Admitting Provider Note  Report given to:   Additional Notes:

## 2023-03-06 DIAGNOSIS — I959 Hypotension, unspecified: Secondary | ICD-10-CM | POA: Diagnosis not present

## 2023-03-06 DIAGNOSIS — I5022 Chronic systolic (congestive) heart failure: Secondary | ICD-10-CM

## 2023-03-06 DIAGNOSIS — R55 Syncope and collapse: Secondary | ICD-10-CM | POA: Diagnosis not present

## 2023-03-06 DIAGNOSIS — I4892 Unspecified atrial flutter: Secondary | ICD-10-CM | POA: Diagnosis not present

## 2023-03-06 DIAGNOSIS — N179 Acute kidney failure, unspecified: Secondary | ICD-10-CM | POA: Diagnosis not present

## 2023-03-06 DIAGNOSIS — R6510 Systemic inflammatory response syndrome (SIRS) of non-infectious origin without acute organ dysfunction: Secondary | ICD-10-CM | POA: Diagnosis not present

## 2023-03-06 LAB — HEPATIC FUNCTION PANEL
ALT: 75 U/L — ABNORMAL HIGH (ref 0–44)
AST: 57 U/L — ABNORMAL HIGH (ref 15–41)
Albumin: 2.2 g/dL — ABNORMAL LOW (ref 3.5–5.0)
Alkaline Phosphatase: 76 U/L (ref 38–126)
Bilirubin, Direct: 0.2 mg/dL (ref 0.0–0.2)
Indirect Bilirubin: 0.7 mg/dL (ref 0.3–0.9)
Total Bilirubin: 0.9 mg/dL (ref <1.2)
Total Protein: 5.3 g/dL — ABNORMAL LOW (ref 6.5–8.1)

## 2023-03-06 LAB — CBC
HCT: 32.4 % — ABNORMAL LOW (ref 39.0–52.0)
Hemoglobin: 10.6 g/dL — ABNORMAL LOW (ref 13.0–17.0)
MCH: 28.1 pg (ref 26.0–34.0)
MCHC: 32.7 g/dL (ref 30.0–36.0)
MCV: 85.9 fL (ref 80.0–100.0)
Platelets: 313 10*3/uL (ref 150–400)
RBC: 3.77 MIL/uL — ABNORMAL LOW (ref 4.22–5.81)
RDW: 15.9 % — ABNORMAL HIGH (ref 11.5–15.5)
WBC: 17.5 10*3/uL — ABNORMAL HIGH (ref 4.0–10.5)
nRBC: 0 % (ref 0.0–0.2)

## 2023-03-06 LAB — BASIC METABOLIC PANEL
Anion gap: 8 (ref 5–15)
BUN: 37 mg/dL — ABNORMAL HIGH (ref 8–23)
CO2: 18 mmol/L — ABNORMAL LOW (ref 22–32)
Calcium: 7.8 mg/dL — ABNORMAL LOW (ref 8.9–10.3)
Chloride: 102 mmol/L (ref 98–111)
Creatinine, Ser: 2.33 mg/dL — ABNORMAL HIGH (ref 0.61–1.24)
GFR, Estimated: 28 mL/min — ABNORMAL LOW (ref >60)
Glucose, Bld: 219 mg/dL — ABNORMAL HIGH (ref 70–99)
Potassium: 3.9 mmol/L (ref 3.5–5.1)
Sodium: 128 mmol/L — ABNORMAL LOW (ref 135–145)

## 2023-03-06 LAB — SODIUM, URINE, RANDOM: Sodium, Ur: <10 mmol/L

## 2023-03-06 LAB — GLUCOSE, CAPILLARY
Glucose-Capillary: 199 mg/dL — ABNORMAL HIGH (ref 70–99)
Glucose-Capillary: 136 mg/dL — ABNORMAL HIGH (ref 70–99)
Glucose-Capillary: 148 mg/dL — ABNORMAL HIGH (ref 70–99)
Glucose-Capillary: 209 mg/dL — ABNORMAL HIGH (ref 70–99)

## 2023-03-06 LAB — MRSA NEXT GEN BY PCR, NASAL: MRSA by PCR Next Gen: NOT DETECTED

## 2023-03-06 LAB — CREATININE, URINE, RANDOM: Creatinine, Urine: 136 mg/dL

## 2023-03-06 NOTE — H&P (View-Only) (Signed)
ELECTROPHYSIOLOGY CONSULT NOTE    Patient ID: [name] [name] MRN: [id number], DOB/AGE: 1945/07/20 77 y.o.  Admit date: 03/04/2023 Date of Consult: 03/06/2023  Primary Physician: [name], MD Primary Cardiologist: Arvilla Meres, MD  Electrophysiologist: Dr. Jimmey Ralph   Referring Provider: Dr. Jerral Ralph   Patient Profile: [name] [name] is a 77 y.o. male with a history of chronic HFrEF s/p ICD, paroxysmal atrial fibrillation/flutter, CAD s/p CABG 2018, LV thrombus, PAD, CKD 3B, DM2 who is being seen today for the evaluation of syncopal episode at the request of Dr. Jerral Ralph.  HPI:  [name] [name] is a 77 y.o. male who presented to [facility] ER on 11/4 with reports of syncope.  The patient reports he felt as though he had a viral illness in the recent weeks. He was notably tired.  He was walking up his stairs and then he woke up several minutes later.  He he fell hitting his head. He had an additional episode the day prior and did not seek care for it.  Patient was admitted by [facility] for further evaluation.  CT head and C-spine were negative for acute findings.  Chest x-ray was negative for pneumonia.  Right hip x-ray negative for fracture.  Respiratory viral panel was negative.  Blood cultures assessed on 11/4 and no growth to date as of 11/6, PCT negative.  Given syncopal episode the patient's ICD was interrogated by industry and EP was asked to evaluate.  The patient had notable atrial arrhythmia since late September/early October.  Orthostatic vital signs were not significantly positive with PT on 11/6.  He was felt to be largely euvolemic.  Patient reports he has not noticed any increase in energy level since CCM placement.   Device interrogation 11/6 per MDT shows that the patient has been in AFL since at least September 2024.  EKG review shows the patient in atrial flutter at least since September 2024.  DCCV was discussed with Dr. Graciela Husbands but was deferred as anticoagulation had to be  held for [facility] implant.  He denies chest pain, palpitations, dyspnea, PND, orthopnea, nausea, vomiting, dizziness, syncope, edema, weight gain, or early satiety.   Labs Potassium3.9 (11/06 2440) Magnesium  2.4 (11/05 0449) Creatinine, ser  2.33* (11/06 0706) PLT  313 (11/06 0706) HGB  10.6* (11/06 0706) WBC 17.5* (11/06 0706) Troponin I (High Sensitivity)25* (11/04 2120).    Past Medical History:  Diagnosis Date   AICD (automatic cardioverter/defibrillator) present    Arthritis    Bladder cancer (HCC)    Borderline glaucoma    Chronic systolic (congestive) heart failure (HCC)    Coronary artery disease    Diabetes mellitus type 2, controlled (HCC) ORAL MED   Essential hypertension    Heart murmur    Hepatitis    Medical history non-contributory    Paroxysmal A-fib (HCC)    Paroxysmal A-fib (HCC) 08/20/2020   Peripheral vascular disease (HCC)    Pneumonia      Surgical History:  Past Surgical History:  Procedure Laterality Date   CARDIOVERSION N/A 08/10/2021   Procedure: CARDIOVERSION;  Surgeon: Dolores Patty, MD;  Location: [facility] ENDOSCOPY;  Service: Cardiovascular;  Laterality: N/A;   CARDIOVERSION N/A 11/09/2022   Procedure: CARDIOVERSION;  Surgeon: Maisie Fus, MD;  Location: [facility] INVASIVE CV LAB;  Service: Cardiovascular;  Laterality: N/A;   CAROTID DUPLEX SCAN  07-26-2010   BILATERAL ICA  STENOSIS 1% - 39%   CCM GENERATOR INSERTION N/A 01/04/2023   Procedure: CCM GENERATOR INSERTION;  Surgeon: Duke Salvia, MD;  Location: [facility] INVASIVE CV LAB;  Service: Cardiovascular;  Laterality: N/A;   CHOLECYSTECTOMY N/A 08/22/2020   Procedure: LAPAROSCOPIC CHOLECYSTECTOMY;  Surgeon: Emelia Loron, MD;  Location: [facility] OR;  Service: General;  Laterality: N/A;   CORONARY ARTERY BYPASS GRAFT N/A 04/08/2017   Procedure: CORONARY ARTERY BYPASS GRAFTING (CABG) TIMES TWO USING LEFT INTERNAL MAMMARY ARTERY AND LEFT SAPHENOUS LEG VEIN HARVESTED ENDOSCOPICALLY.  LEG VEIN ALSO HARVESTED  FROM THE RIGHT LEG;  Surgeon: Alleen Borne, MD;  Location: [facility] OR;  Service: Open Heart Surgery;  Laterality: N/A;   CYSTOSCOPY WITH BIOPSY N/A 08/01/2012   Procedure: CYSTOSCOPY WITH BIOPSY BLADDER BIOPSY   ;  Surgeon: Antony Haste, MD;  Location: [facility];  Service: Urology;  Laterality: N/A;   ERCP N/A 08/21/2020   Procedure: ENDOSCOPIC RETROGRADE CHOLANGIOPANCREATOGRAPHY (ERCP);  Surgeon: Jeani Hawking, MD;  Location: [facility] ENDOSCOPY;  Service: Endoscopy;  Laterality: N/A;   FULGURATION OF BLADDER TUMOR N/A 08/01/2012   Procedure: FULGURATION OF BLADDER TUMOR;  Surgeon: Antony Haste, MD;  Location: [facility];  Service: Urology;  Laterality: N/A;   ICD IMPLANT  11/20/2017   ICD IMPLANT N/A 11/20/2017   Procedure: ICD IMPLANT;  Surgeon: Duke Salvia, MD;  Location: [facility] INVASIVE CV LAB;  Service: Cardiovascular;  Laterality: N/A;   LEFT HEART CATH AND CORONARY ANGIOGRAPHY N/A 03/31/2017   Procedure: LEFT HEART CATH AND CORONARY ANGIOGRAPHY;  Surgeon: Marykay Lex, MD;  Location: [facility] INVASIVE CV LAB;  Service: Cardiovascular;  Laterality: N/A;   LUMBAR LAMINECTOMY/DECOMPRESSION MICRODISCECTOMY Bilateral 06/16/2019   Procedure: Bilateral Lumbar Four-Five Laminectomy and Foraminotomy;  Surgeon: Julio Sicks, MD;  Location: [facility] OR;  Service: Neurosurgery;  Laterality: Bilateral;  posterior   REMOVAL OF STONES  08/21/2020   Procedure: REMOVAL OF STONES;  Surgeon: Jeani Hawking, MD;  Location: [facility] ENDOSCOPY;  Service: Endoscopy;;   RIGHT HEART CATH Right 03/31/2017   Procedure: RIGHT HEART CATH;  Surgeon: Marykay Lex, MD;  Location: [facility] INVASIVE CV LAB;  Service: Cardiovascular;  Laterality: Right;   SPHINCTEROTOMY  08/21/2020   Procedure: SPHINCTEROTOMY;  Surgeon: Jeani Hawking, MD;  Location: [facility] ENDOSCOPY;  Service: Endoscopy;;   TEE WITHOUT CARDIOVERSION N/A 04/08/2017   Procedure: TRANSESOPHAGEAL ECHOCARDIOGRAM (TEE);  Surgeon: Alleen Borne,  MD;  [facility] OR;  Service: Open Heart Surgery;  Laterality: N/A;   TRANSURETHRAL RESECTION OF BLADDER TUMOR  05/29/2011   Procedure: TRANSURETHRAL RESECTION OF BLADDER TUMOR (TURBT);  Surgeon: Antony Haste, MD;  Location: [facility];  Service: Urology;  Laterality: N/A;   VENTRICULAR ASSIST DEVICE INSERTION Right 03/31/2017   Procedure: VENTRICULAR ASSIST DEVICE INSERTION;  Surgeon: Marykay Lex, MD;  Location: [facility] INVASIVE CV LAB;  Service: Cardiovascular;  Laterality: Right;     Medications Prior to Admission  Medication Sig Dispense Refill Last Dose   acetaminophen (TYLENOL) 500 MG tablet Take 1,000 mg by mouth every 6 (six) hours as needed for mild pain (pain score 1-3), headache or fever.   03/04/2023   amiodarone (PACERONE) 200 MG tablet Take 1 tablet (200 mg total) by mouth 2 (two) times daily. 180 tablet 3 03/04/2023   amLODipine (NORVASC) 5 MG tablet Take 1 tablet (5 mg total) by mouth daily. 90 tablet 3 03/04/2023   atorvastatin (LIPITOR) 80 MG tablet TAKE 1 TABLET BY MOUTH EVERY DAY 90 tablet 3 03/04/2023   cyanocobalamin (VITAMIN B12) 250 MCG tablet Take 750 mcg by mouth daily.  03/04/2023   ELIQUIS 5 MG TABS tablet TAKE 1 TABLET TWICE A DAY 180 tablet 3 03/04/2023 at 1900   oxymetazoline (DRISTAN) 0.05 % nasal spray Place 1 spray into both nostrils at bedtime.   Past Week   sacubitril-valsartan (ENTRESTO) 97-103 MG Take 1 tablet by mouth 2 (two) times daily. Take 1/2 tablet BID   03/04/2023   torsemide (DEMADEX) 20 MG tablet Take 1 tablet (20 mg total) by mouth daily. 90 tablet 3 03/04/2023   zolpidem (AMBIEN) 10 MG tablet Take 5 mg by mouth at bedtime.   Past Week   sacubitril-valsartan (ENTRESTO) 49-51 MG Take 1 tablet by mouth 2 (two) times daily. 60 tablet 3     Inpatient Medications:   amiodarone  200 mg Oral BID   apixaban  5 mg Oral BID   atorvastatin  80 mg Oral Daily   insulin aspart  0-6 Units Subcutaneous TID WC   oxymetazoline  1 spray  Each Nare QHS   zolpidem  5 mg Oral QHS    Allergies:  Allergies  Allergen Reactions   Spironolactone Other (See Comments)    Painful gynecomastia   Empagliflozin Rash    Rash at the groin Jardiance   Niacin And Related Hives    Family History  Problem Relation Age of Onset   Heart attack Sister    Heart disease Sister      Physical Exam: Vitals:   03/05/23 2000 03/06/23 0000 03/06/23 0400 03/06/23 0555  BP: (!) 118/59 133/70 123/69   Pulse: 95 (!) 104  98  Resp: 18 17 20 18   Temp: 98.1 F (36.7 C) 98.4 F (36.9 C) 98 F (36.7 C)   TempSrc: Oral Oral Oral   SpO2: 96% 92%  93%  Weight:      Height:        GEN- pleasant elderly male in NAD, A&O x 3, normal affect HEENT: Normocephalic, atraumatic Lungs- CTAB, Normal effort.  Heart- Regular rate and rhythm, No M/G/R.  GI- Soft, NT, ND.  Extremities- No clubbing, cyanosis, no significant edema   Radiology/Studies: DG Hip Unilat W or Wo Pelvis 1 View Right  Result Date: 03/05/2023 CLINICAL DATA:  Multiple falls, right hip pain EXAM: DG HIP (WITH OR WITHOUT PELVIS) 1V RIGHT COMPARISON:  12/15/2018 FINDINGS: Mild symmetric degenerative changes in the hips with spurring. SI joints symmetric. No acute bony abnormality. Specifically, no fracture, subluxation, or dislocation. IMPRESSION: No acute bony abnormality. Electronically Signed   By: Charlett Nose M.D.   On: 03/05/2023 00:59   CT HEAD WO CONTRAST ( )  Result Date: 03/04/2023 CLINICAL DATA:  Head trauma, minor (Age >= 65y); Neck trauma (Age >= 65y), fall, chronic anticoagulation EXAM: CT HEAD WITHOUT CONTRAST CT CERVICAL SPINE WITHOUT CONTRAST TECHNIQUE: Multidetector CT imaging of the head and cervical spine was performed following the standard protocol without intravenous contrast. Multiplanar CT image reconstructions of the cervical spine were also generated. RADIATION DOSE REDUCTION: This exam was performed according to the departmental dose-optimization program  which includes automated exposure control, adjustment of the mA and/or kV according to patient size and/or use of iterative reconstruction technique. COMPARISON:  None Available. FINDINGS: CT HEAD FINDINGS Brain: No evidence of acute infarction, hemorrhage, hydrocephalus, extra-axial collection or mass lesion/mass effect. Vascular: No hyperdense vessel or unexpected calcification. Skull: Normal. Negative for fracture or focal lesion. Sinuses/Orbits: No acute finding. Other: Mastoid air cells and middle ear cavities are clear. Small right parietal scalp hematoma. CT CERVICAL SPINE FINDINGS Alignment: Normal. Skull  base and vertebrae: Craniocervical alignment is normal. The atlantodental interval is not widened. No acute fracture of the cervical spine. Vertebral body height is preserved. Soft tissues and spinal canal: No prevertebral fluid or swelling. No visible canal hematoma. Disc levels: There is endplate remodeling throughout the cervical spine in keeping with changes of diffuse moderate degenerative disc disease. Prevertebral soft tissues are not thickened on sagittal reformats. The spinal canal is widely patent. No significant neuroforaminal narrowing. Upper chest: 19 mm nodule is seen within the right thyroid gland demonstrating punctate internal calcification and heterogeneous internal enhancement. This is not well characterized on this examination. Visualized lung apices demonstrate at least mild emphysematous. Other: None IMPRESSION: 1. No acute intracranial abnormality. No calvarial fracture. Small right parietal scalp hematoma. 2. No acute fracture or listhesis of the cervical spine. 3. 19 mm nodule within the right thyroid gland, not well characterized on this examination. Recommend thyroid US (ref: J Am Coll Radiol. 2015 Feb;12(2): 143-50). Emphysema (ICD10-J43.9). Electronically Signed   By: Helyn Numbers M.D.   On: 03/04/2023 21:45   CT Cervical Spine Wo Contrast  Result Date: 03/04/2023 CLINICAL  DATA:  Head trauma, minor (Age >= 65y); Neck trauma (Age >= 65y), fall, chronic anticoagulation EXAM: CT HEAD WITHOUT CONTRAST CT CERVICAL SPINE WITHOUT CONTRAST TECHNIQUE: Multidetector CT imaging of the head and cervical spine was performed following the standard protocol without intravenous contrast. Multiplanar CT image reconstructions of the cervical spine were also generated. RADIATION DOSE REDUCTION: This exam was performed according to the departmental dose-optimization program which includes automated exposure control, adjustment of the mA and/or kV according to patient size and/or use of iterative reconstruction technique. COMPARISON:  None Available. FINDINGS: CT HEAD FINDINGS Brain: No evidence of acute infarction, hemorrhage, hydrocephalus, extra-axial collection or mass lesion/mass effect. Vascular: No hyperdense vessel or unexpected calcification. Skull: Normal. Negative for fracture or focal lesion. Sinuses/Orbits: No acute finding. Other: Mastoid air cells and middle ear cavities are clear. Small right parietal scalp hematoma. CT CERVICAL SPINE FINDINGS Alignment: Normal. Skull base and vertebrae: Craniocervical alignment is normal. The atlantodental interval is not widened. No acute fracture of the cervical spine. Vertebral body height is preserved. Soft tissues and spinal canal: No prevertebral fluid or swelling. No visible canal hematoma. Disc levels: There is endplate remodeling throughout the cervical spine in keeping with changes of diffuse moderate degenerative disc disease. Prevertebral soft tissues are not thickened on sagittal reformats. The spinal canal is widely patent. No significant neuroforaminal narrowing. Upper chest: 19 mm nodule is seen within the right thyroid gland demonstrating punctate internal calcification and heterogeneous internal enhancement. This is not well characterized on this examination. Visualized lung apices demonstrate at least mild emphysematous. Other: None  IMPRESSION: 1. No acute intracranial abnormality. No calvarial fracture. Small right parietal scalp hematoma. 2. No acute fracture or listhesis of the cervical spine. 3. 19 mm nodule within the right thyroid gland, not well characterized on this examination. Recommend thyroid US (ref: J Am Coll Radiol. 2015 Feb;12(2): 143-50). Emphysema (ICD10-J43.9). Electronically Signed   By: Helyn Numbers M.D.   On: 03/04/2023 21:45   DG Chest Portable 1 View  Result Date: 03/04/2023 CLINICAL DATA:  Syncope, fall. EXAM: PORTABLE CHEST 1 VIEW COMPARISON:  01/16/2023. FINDINGS: The heart is enlarged and the mediastinal contour is within normal limits. There is atherosclerotic calcification of the aorta. Interstitial prominence is present bilaterally and likely chronic. No consolidation, effusion, or pneumothorax. Multi lead pacemaker devices are present bilaterally. Sternotomy wires are noted. No  acute osseous abnormality is seen. IMPRESSION: 1. No active disease. 2. Cardiomegaly. 3. Chronic interstitial changes bilaterally. Electronically Signed   By: Thornell Sartorius M.D.   On: 03/04/2023 21:44    EKG: (personally reviewed) EKG 11/13/2022 > SB with 1AVB EKG 01/04/2023 > AFL 99bpm  EKG 03/05/23 > AFL 100 bpm, IVCD  TELEMETRY: AFL 90's (personally reviewed)  STUDIES: cMRI 2019 > mildly dilated LV with wall motion abnormalities, laminated apical thrombus ECHO 09/06/22 > LVEF 25-30%, global hypokinesis with apical akinesis, LV has severely decreased function, GII DD, RV systolic function mildly reduced   DEVICE HISTORY:  MDT Single Chamber ICD, implant 10/2017 for HFrEF / cardiomyopathy  CCM Generator Insertion 01/04/23  Assessment/Plan:  Atrial Flutter / Paroxysmal Atrial Fibrillation  In AFL/AF since 12/2022.  Subjectively feels fatigued.  -NPO after MN for TEE (given hx of LA thrombus) and DCCV in am  -pt confirms no missed doses of OAC -continue amiodarone 200mg  BID  -continue eliquis 5mg  BID, dose appropriate  for age / wt  Syncope  -No clear cause of syncope on device interrogation (VT/VF) -further work up per primary   For questions or updates, please contact [facility] Please consult [email or web address] for contact info under Cardiology/STEMI.  Signed, Canary Brim, MSN, APRN, NP-C, AGACNP-BC [facility] - Electrophysiology  03/06/2023, 4:40 PM

## 2023-03-06 NOTE — Plan of Care (Signed)
  Problem: Education: Goal: Knowledge of General Education information will improve Description: Including pain rating scale, medication(s)/side effects and non-pharmacologic comfort measures Outcome: Progressing   Problem: Health Behavior/Discharge Planning: Goal: Ability to manage health-related needs will improve Outcome: Progressing   Problem: Clinical Measurements: Goal: Ability to maintain clinical measurements within normal limits will improve Outcome: Progressing   Problem: Activity: Goal: Risk for activity intolerance will decrease Outcome: Progressing   Problem: Nutrition: Goal: Adequate nutrition will be maintained Outcome: Progressing   Problem: Elimination: Goal: Will not experience complications related to bowel motility Outcome: Progressing   Problem: Pain Management: Goal: General experience of comfort will improve Outcome: Progressing   Problem: Safety: Goal: Ability to remain free from injury will improve Outcome: Progressing   Problem: Skin Integrity: Goal: Risk for impaired skin integrity will decrease Outcome: Progressing

## 2023-03-06 NOTE — Progress Notes (Addendum)
PROGRESS NOTE        PATIENT DETAILS Name: [name] [name] Age: 77 y.o. Sex: male Date of Birth: 1946-03-09 Admit Date: 03/04/2023 Admitting Physician Angie Fava, DO ZOX:[id number], [name], MD  Brief Summary: Patient is a 77 y.o.  male with history of chronic HFrEF-s/p ICD, CAD s/p CABG 2018, persistent atrial fibrillation on Eliquis, LV thrombus, DM-2, CKD stage IIIb, PAD presented with several days history of subjective fever/malaise/weakness "felt like I had the flu"-and sustained a syncopal episode on the day of admission-subsequently brought to the ED and admitted to the hospitalist service.  See below for further details.  Significant events: 11/4>> admit to [facility]  Significant studies: 11/4>> CT head: No acute intracranial abnormality 11/4>> CT C-spine: No fracture 11/4>> CXR: No PNA 11/5>> x-ray hip right: No fracture  Significant microbiology data: 11/4>> blood cultures: No growth 11/5>> COVID PCR: Negative 11/5>> respiratory virus panel: Negative  Procedures: None  Consults: None  Subjective: Feels better-no cough-no abdominal pain-no nausea-no vomiting-no diarrhea-no rash.  Objective: Vitals: Blood pressure 123/69, pulse 98, temperature 98 F (36.7 C), temperature source Oral, resp. rate 18, height 6\' 2"  (1.88 m), weight 91.2 kg, SpO2 93%.   Exam: Gen Exam:Alert awake-not in any distress HEENT:atraumatic, normocephalic Chest: B/L clear to auscultation anteriorly CVS:S1S2 regular Abdomen:soft non tender, non distended Extremities:no edema Neurology: Non focal Skin: no rash  Pertinent Labs/Radiology:    Latest Ref Rng & Units 03/06/2023    7:06 AM 03/05/2023    4:49 AM 03/04/2023    9:20 PM  CBC  WBC 4.0 - 10.5 K/uL 17.5  20.4  22.1   Hemoglobin 13.0 - 17.0 g/dL 40.9  81.1  91.4   Hematocrit 39.0 - 52.0 % 32.4  34.4  39.0   Platelets 150 - 400 K/uL 313  310  336     Lab Results  Component Value Date   NA 128 (L)  03/06/2023   K 3.9 03/06/2023   CL 102 03/06/2023   CO2 18 (L) 03/06/2023    Assessment/Plan: SIRS/leukocytosis ?  Viral etiology Cultures/imaging negative for infection-none evident by physical exam Procalcitonin negative Hemodynamically stable Reasonable to stop antibiotics and monitor  Syncope Check orthostatic vital signs Discussed with EP-they will review ICD interrogation data-to see if there was any significant arrhythmias PT/OT eval  Addendum Per PT-orthostatic vital signs 106/59 seated, BP: 90/60 standing   Chronic HFrEF-s/p AICD Volume status stable Continue to hold diuretics today-mobilize-if remains stable-orthostatic vital signs-suspect could resume diuretic/GDMT medications 11/7.  Persistent atrial fibrillation Rate controlled Amiodarone/Eliquis  CAD s/p CABG No anginal symptoms Not on aspirin as on Eliquis Continue statin  AKI on CKD stage IIIb AKI likely hemodynamically mediated Improving-trending back to baseline with supportive care  Hyponatremia Mild Stop IV fluids Resume Demadex once ambulatory/no evidence of orthostatic vital signs Fluid restrict Recheck electrolytes tomorrow.  DM-2 (A1c 6.9 on 11/5) CBG stable on SSI  Recent Labs    03/05/23 2204 03/06/23 0843  GLUCAP 165* 199*     19 mm right thyroid gland nodule Incidentally seen on CT C-spine Stable for outpatient follow-up by PCP  BMI: Estimated body mass index is 25.81 kg/m as calculated from the following:   Height as of this encounter: 6\' 2"  (1.88 m).   Weight as of this encounter: 91.2 kg.   Code status:   Code Status: Full  Code   DVT Prophylaxis: SCDs Start: 03/05/23 0210 apixaban (ELIQUIS) tablet 5 mg    Family Communication: None at bedside  Disposition Plan: Status is: Inpatient Remains inpatient appropriate because: Severity of illness   Planned Discharge Destination:Home   Diet: Diet Order             Diet regular Room service appropriate? Yes;  Fluid consistency: Thin  Diet effective now                     Antimicrobial agents: Anti-infectives (From admission, onward)    Start     Dose/Rate Route Frequency Ordered Stop   03/05/23 2200  cefTRIAXone (ROCEPHIN) 1 g in sodium chloride 0.9 % 100 mL IVPB  Status:  Discontinued        1 g 200 mL/hr over 30 Minutes Intravenous Every 24 hours 03/05/23 0211 03/05/23 0859   03/05/23 1145  ceFEPIme (MAXIPIME) 2 g in sodium chloride 0.9 % 100 mL IVPB        2 g 200 mL/hr over 30 Minutes Intravenous Every 12 hours 03/05/23 1139     03/05/23 1140  vancomycin variable dose per unstable renal function (pharmacist dosing)         Does not apply See admin instructions 03/05/23 1140     03/04/23 2330  ceFEPIme (MAXIPIME) 2 g in sodium chloride 0.9 % 100 mL IVPB        2 g 200 mL/hr over 30 Minutes Intravenous  Once 03/04/23 2317 03/05/23 0001   03/04/23 2330  metroNIDAZOLE (FLAGYL) IVPB 500 mg        500 mg 100 mL/hr over 60 Minutes Intravenous  Once 03/04/23 2317 03/05/23 0131   03/04/23 2330  vancomycin (VANCOCIN) IVPB 1000 mg/200 mL premix  Status:  Discontinued        1,000 mg 200 mL/hr over 60 Minutes Intravenous  Once 03/04/23 2317 03/04/23 2320   03/04/23 2330  vancomycin (VANCOREADY) IVPB 2000 mg/400 mL        2,000 mg 200 mL/hr over 120 Minutes Intravenous  Once 03/04/23 2320 03/05/23 0354        MEDICATIONS: Scheduled Meds:  amiodarone  200 mg Oral BID   apixaban  5 mg Oral BID   atorvastatin  80 mg Oral Daily   insulin aspart  0-6 Units Subcutaneous TID WC   oxymetazoline  1 spray Each Nare QHS   vancomycin variable dose per unstable renal function (pharmacist dosing)   Does not apply See admin instructions   zolpidem  5 mg Oral QHS   Continuous Infusions:  ceFEPime (MAXIPIME) IV 2 g (03/05/23 2248)   PRN Meds:.acetaminophen **OR** acetaminophen, melatonin, ondansetron (ZOFRAN) IV   I have personally reviewed following labs and imaging studies  LABORATORY  DATA: CBC: Recent Labs  Lab 03/04/23 2120 03/05/23 0449 03/06/23 0706  WBC 22.1* 20.4* 17.5*  NEUTROABS  --  17.2*  --   HGB 12.5* 10.9* 10.6*  HCT 39.0 34.4* 32.4*  MCV 88.4 88.9 85.9  PLT 336 310 313    Basic Metabolic Panel: Recent Labs  Lab 03/04/23 2120 03/05/23 0449 03/06/23 0706  NA 131* 130* 128*  K 4.3 4.1 3.9  CL 99 102 102  CO2 21* 21* 18*  GLUCOSE 209* 167* 219*  BUN 39* 35* 37*  CREATININE 2.52* 2.44* 2.33*  CALCIUM 8.1* 7.5* 7.8*  MG  --  2.4  --     GFR: Estimated Creatinine Clearance: 30.9 mL/min (A) (by C-G  formula based on SCr of 2.33 mg/dL (H)).  Liver Function Tests: Recent Labs  Lab 03/04/23 2120 03/05/23 0449 03/06/23 0706  AST 53* 49* 57*  ALT 79* 71* 75*  ALKPHOS 84 70 76  BILITOT 1.4* 1.5* 0.9  PROT 5.9* 5.1* 5.3*  ALBUMIN 2.7* 2.3* 2.2*   No results for input(s): "LIPASE", "AMYLASE" in the last 168 hours. No results for input(s): "AMMONIA" in the last 168 hours.  Coagulation Profile: No results for input(s): "INR", "PROTIME" in the last 168 hours.  Cardiac Enzymes: No results for input(s): "CKTOTAL", "CKMB", "CKMBINDEX", "TROPONINI" in the last 168 hours.  BNP (last 3 results) Recent Labs    09/25/22 0953  PROBNP 2,187*    Lipid Profile: No results for input(s): "CHOL", "HDL", "LDLCALC", "TRIG", "CHOLHDL", "LDLDIRECT" in the last 72 hours.  Thyroid Function Tests: Recent Labs    03/05/23 0449  TSH 0.838    Anemia Panel: No results for input(s): "VITAMINB12", "FOLATE", "FERRITIN", "TIBC", "IRON", "RETICCTPCT" in the last 72 hours.  Urine analysis:    Component Value Date/Time   COLORURINE YELLOW 03/05/2023 0250   APPEARANCEUR CLEAR 03/05/2023 0250   LABSPEC 1.023 03/05/2023 0250   PHURINE 5.0 03/05/2023 0250   GLUCOSEU NEGATIVE 03/05/2023 0250   HGBUR NEGATIVE 03/05/2023 0250   BILIRUBINUR NEGATIVE 03/05/2023 0250   KETONESUR NEGATIVE 03/05/2023 0250   PROTEINUR 100 (A) 03/05/2023 0250   NITRITE NEGATIVE  03/05/2023 0250   LEUKOCYTESUR NEGATIVE 03/05/2023 0250    Sepsis Labs: Lactic Acid, Venous    Component Value Date/Time   LATICACIDVEN 1.3 03/05/2023 0607    MICROBIOLOGY: Recent Results (from the past 240 hour(s))  Culture, blood (routine x 2)     Status: None (Preliminary result)   Collection Time: 03/04/23 10:46 PM   Specimen: BLOOD RIGHT FOREARM  Result Value Ref Range Status   Specimen Description BLOOD RIGHT FOREARM  Final   Special Requests   Final    BOTTLES DRAWN AEROBIC AND ANAEROBIC Blood Culture results may not be optimal due to an excessive volume of blood received in culture bottles   Culture   Final    NO GROWTH 2 DAYS Performed at [facility], [address], [place], [place] [address]    Report Status PENDING  Incomplete  Culture, blood (routine x 2)     Status: None (Preliminary result)   Collection Time: 03/04/23 11:14 PM   Specimen: BLOOD LEFT FOREARM  Result Value Ref Range Status   Specimen Description BLOOD LEFT FOREARM  Final   Special Requests   Final    BOTTLES DRAWN AEROBIC AND ANAEROBIC Blood Culture adequate volume   Culture   Final    NO GROWTH 1 DAY Performed at [facility], [address] [place], [place] [address]    Report Status PENDING  Incomplete  SARS Coronavirus 2 by RT PCR (hospital order, performed in [facility] hospital lab) *cepheid single result test* Anterior Nasal Swab     Status: None   Collection Time: 03/05/23  2:11 AM   Specimen: Anterior Nasal Swab  Result Value Ref Range Status   SARS Coronavirus 2 by RT PCR NEGATIVE NEGATIVE Final    Comment: Performed at [facility], [address] [place], [place] [address]  Respiratory (~20 pathogens) panel by PCR     Status: None   Collection Time: 03/05/23 11:14 AM   Specimen: Nasopharyngeal Swab; Respiratory  Result Value Ref Range Status   Adenovirus NOT DETECTED NOT  DETECTED Final   Coronavirus 229E NOT DETECTED NOT DETECTED Final    Comment:  (NOTE) The Coronavirus on the Respiratory Panel, DOES NOT test for the novel  Coronavirus (2019 nCoV)    Coronavirus HKU1 NOT DETECTED NOT DETECTED Final   Coronavirus NL63 NOT DETECTED NOT DETECTED Final   Coronavirus OC43 NOT DETECTED NOT DETECTED Final   Metapneumovirus NOT DETECTED NOT DETECTED Final   Rhinovirus / Enterovirus NOT DETECTED NOT DETECTED Final   Influenza A NOT DETECTED NOT DETECTED Final   Influenza B NOT DETECTED NOT DETECTED Final   Parainfluenza Virus 1 NOT DETECTED NOT DETECTED Final   Parainfluenza Virus 2 NOT DETECTED NOT DETECTED Final   Parainfluenza Virus 3 NOT DETECTED NOT DETECTED Final   Parainfluenza Virus 4 NOT DETECTED NOT DETECTED Final   Respiratory Syncytial Virus NOT DETECTED NOT DETECTED Final   Bordetella pertussis NOT DETECTED NOT DETECTED Final   Bordetella Parapertussis NOT DETECTED NOT DETECTED Final   Chlamydophila pneumoniae NOT DETECTED NOT DETECTED Final   Mycoplasma pneumoniae NOT DETECTED NOT DETECTED Final    Comment: Performed at [facility], [address] [place], [place] [address]  MRSA Next Gen by PCR, Nasal     Status: None   Collection Time: 03/05/23 11:50 PM   Specimen: Nasal Mucosa; Nasal Swab  Result Value Ref Range Status   MRSA by PCR Next Gen NOT DETECTED NOT DETECTED Final    Comment: (NOTE) The GeneXpert MRSA Assay (FDA approved for NASAL specimens only), is one component of a comprehensive MRSA colonization surveillance program. It is not intended to diagnose MRSA infection nor to guide or monitor treatment for MRSA infections. Test performance is not FDA approved in patients less than 36 years old. Performed at [facility], [address], [place], [place] [address]     RADIOLOGY STUDIES/RESULTS: DG Hip Unilat W or Wo Pelvis 1 View Right  Result Date: 03/05/2023 CLINICAL DATA:  Multiple falls, right hip pain EXAM: DG HIP (WITH OR WITHOUT PELVIS) 1V RIGHT COMPARISON:  12/15/2018 FINDINGS: Mild  symmetric degenerative changes in the hips with spurring. SI joints symmetric. No acute bony abnormality. Specifically, no fracture, subluxation, or dislocation. IMPRESSION: No acute bony abnormality. Electronically Signed   By: Charlett Nose M.D.   On: 03/05/2023 00:59   CT HEAD WO CONTRAST ( )  Result Date: 03/04/2023 CLINICAL DATA:  Head trauma, minor (Age >= 65y); Neck trauma (Age >= 65y), fall, chronic anticoagulation EXAM: CT HEAD WITHOUT CONTRAST CT CERVICAL SPINE WITHOUT CONTRAST TECHNIQUE: Multidetector CT imaging of the head and cervical spine was performed following the standard protocol without intravenous contrast. Multiplanar CT image reconstructions of the cervical spine were also generated. RADIATION DOSE REDUCTION: This exam was performed according to the departmental dose-optimization program which includes automated exposure control, adjustment of the mA and/or kV according to patient size and/or use of iterative reconstruction technique. COMPARISON:  None Available. FINDINGS: CT HEAD FINDINGS Brain: No evidence of acute infarction, hemorrhage, hydrocephalus, extra-axial collection or mass lesion/mass effect. Vascular: No hyperdense vessel or unexpected calcification. Skull: Normal. Negative for fracture or focal lesion. Sinuses/Orbits: No acute finding. Other: Mastoid air cells and middle ear cavities are clear. Small right parietal scalp hematoma. CT CERVICAL SPINE FINDINGS Alignment: Normal. Skull base and vertebrae: Craniocervical alignment is normal. The atlantodental interval is not widened. No acute fracture of the cervical spine. Vertebral body height is preserved. Soft tissues and spinal canal: No prevertebral fluid or swelling. No visible canal hematoma.  Disc levels: There is endplate remodeling throughout the cervical spine in keeping with changes of diffuse moderate degenerative disc disease. Prevertebral soft tissues are not thickened on sagittal reformats. The spinal canal is  widely patent. No significant neuroforaminal narrowing. Upper chest: 19 mm nodule is seen within the right thyroid gland demonstrating punctate internal calcification and heterogeneous internal enhancement. This is not well characterized on this examination. Visualized lung apices demonstrate at least mild emphysematous. Other: None IMPRESSION: 1. No acute intracranial abnormality. No calvarial fracture. Small right parietal scalp hematoma. 2. No acute fracture or listhesis of the cervical spine. 3. 19 mm nodule within the right thyroid gland, not well characterized on this examination. Recommend thyroid US (ref: J Am Coll Radiol. 2015 Feb;12(2): 143-50). Emphysema (ICD10-J43.9). Electronically Signed   By: Helyn Numbers M.D.   On: 03/04/2023 21:45   CT Cervical Spine Wo Contrast  Result Date: 03/04/2023 CLINICAL DATA:  Head trauma, minor (Age >= 65y); Neck trauma (Age >= 65y), fall, chronic anticoagulation EXAM: CT HEAD WITHOUT CONTRAST CT CERVICAL SPINE WITHOUT CONTRAST TECHNIQUE: Multidetector CT imaging of the head and cervical spine was performed following the standard protocol without intravenous contrast. Multiplanar CT image reconstructions of the cervical spine were also generated. RADIATION DOSE REDUCTION: This exam was performed according to the departmental dose-optimization program which includes automated exposure control, adjustment of the mA and/or kV according to patient size and/or use of iterative reconstruction technique. COMPARISON:  None Available. FINDINGS: CT HEAD FINDINGS Brain: No evidence of acute infarction, hemorrhage, hydrocephalus, extra-axial collection or mass lesion/mass effect. Vascular: No hyperdense vessel or unexpected calcification. Skull: Normal. Negative for fracture or focal lesion. Sinuses/Orbits: No acute finding. Other: Mastoid air cells and middle ear cavities are clear. Small right parietal scalp hematoma. CT CERVICAL SPINE FINDINGS Alignment: Normal. Skull base  and vertebrae: Craniocervical alignment is normal. The atlantodental interval is not widened. No acute fracture of the cervical spine. Vertebral body height is preserved. Soft tissues and spinal canal: No prevertebral fluid or swelling. No visible canal hematoma. Disc levels: There is endplate remodeling throughout the cervical spine in keeping with changes of diffuse moderate degenerative disc disease. Prevertebral soft tissues are not thickened on sagittal reformats. The spinal canal is widely patent. No significant neuroforaminal narrowing. Upper chest: 19 mm nodule is seen within the right thyroid gland demonstrating punctate internal calcification and heterogeneous internal enhancement. This is not well characterized on this examination. Visualized lung apices demonstrate at least mild emphysematous. Other: None IMPRESSION: 1. No acute intracranial abnormality. No calvarial fracture. Small right parietal scalp hematoma. 2. No acute fracture or listhesis of the cervical spine. 3. 19 mm nodule within the right thyroid gland, not well characterized on this examination. Recommend thyroid US (ref: J Am Coll Radiol. 2015 Feb;12(2): 143-50). Emphysema (ICD10-J43.9). Electronically Signed   By: Helyn Numbers M.D.   On: 03/04/2023 21:45   DG Chest Portable 1 View  Result Date: 03/04/2023 CLINICAL DATA:  Syncope, fall. EXAM: PORTABLE CHEST 1 VIEW COMPARISON:  01/16/2023. FINDINGS: The heart is enlarged and the mediastinal contour is within normal limits. There is atherosclerotic calcification of the aorta. Interstitial prominence is present bilaterally and likely chronic. No consolidation, effusion, or pneumothorax. Multi lead pacemaker devices are present bilaterally. Sternotomy wires are noted. No acute osseous abnormality is seen. IMPRESSION: 1. No active disease. 2. Cardiomegaly. 3. Chronic interstitial changes bilaterally. Electronically Signed   By: Thornell Sartorius M.D.   On: 03/04/2023 21:44     LOS: 1 day  Jeoffrey Massed, MD  [facility]    To contact the attending provider between 7A-7P or the covering provider during after hours 7P-7A, please log into the web site [email or web address] and access using universal [employer] password for that web site. If you do not have the password, please call the hospital operator.  03/06/2023, 10:55 AM

## 2023-03-06 NOTE — Evaluation (Signed)
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  He presented with complaint of single presyncopal episode resulting in a fall in which he hit his head.  Clinical Impression  Pt presents with admitting diagnosis above. Pt today was able to ambulate short distance in room requiring CGA/Min A for all mobility. Orthostatics as follows: BP: 106/59 seated, BP: 90/60 standing, BP: 99/65 seated after ambulation, BP: 124/59 supine. Pt reports PTA he was fully independent with no AD however has had a few blackout falls recently. Recommend HHPT upon DC. PT will continue to follow.       If plan is discharge home, recommend the following: A little help with walking and/or transfers;A little help with bathing/dressing/bathroom;Assistance with cooking/housework;Assist for transportation;Help with stairs or ramp for entrance   Can travel by private vehicle        Equipment Recommendations None recommended by PT  Recommendations for Other Services       Functional Status Assessment Patient has had a recent decline in their functional status and demonstrates the ability to make significant improvements in function in a reasonable and predictable amount of time.     Precautions / Restrictions Precautions Precautions: Fall Precaution Comments: R shoulder and R hip discomfort post fall.  Xrays have been (-) for fx. Restrictions Weight Bearing Restrictions: No      Mobility  Bed Mobility Overal bed mobility: Needs Assistance Bed Mobility: Sit to Supine       Sit to supine: Min assist   General bed mobility comments: Assistance with RLE management     Transfers Overall transfer level: Needs assistance Equipment used: 1 person hand held assist Transfers: Sit to/from Stand Sit to Stand: Min assist           General transfer comment: Min A to power up from chair.    Ambulation/Gait Ambulation/Gait assistance: Contact guard assist, Min assist Gait Distance (Feet): 15 Feet Assistive device: 1 person hand held assist, Rolling walker (2 wheels) Gait Pattern/deviations: Knees buckling, Decreased stride length, Step-through pattern, Wide base of support, Trunk flexed Gait velocity: decreased     General Gait Details: 1 episode of R knee buckling in hallway. Initially started with 1 person HHA hwoever transitioned to RW which pt stated felt much better.  Stairs            Wheelchair Mobility     Tilt Bed    Modified Rankin (Stroke Patients Only)       Balance Overall balance assessment: Needs assistance Sitting-balance support: Feet supported Sitting balance-Leahy Scale: Fair     Standing balance support: Single extremity supported, Bilateral upper extremity supported Standing balance-Leahy Scale: Fair Standing balance comment: Reliant on external support                             Pertinent Vitals/Pain Pain Assessment Pain Assessment: 0-10 Pain Score: 8  Pain Location: R shoulder > than R hip Pain Descriptors / Indicators: Tender, Discomfort, Grimacing, Guarding Pain Intervention(s): Monitored during session, Limited activity within patient's tolerance, Premedicated before session    Home Living Family/patient expects to be discharged to:: Private residence Living Arrangements: Spouse/significant other Available Help at Discharge: Family;Available 24 hours/day Type  of Home: House Home Access: Stairs to enter Entrance Stairs-Rails: None Entrance Stairs-Number of Steps: 2 Alternate Level Stairs-Number of Steps: flight Home Layout: Multi-level;Bed/bath upstairs Home Equipment: Clinical biochemist (2 wheels);Cane - single point;Shower seat      Prior Function Prior Level of Function : Independent/Modified Independent;Driving;History of Falls (last six months)             Mobility Comments: Ind no AD ADLs Comments: Ind     Extremity/Trunk Assessment   Upper Extremity Assessment Upper Extremity Assessment: Defer to OT evaluation RUE: Unable to fully assess due to pain    Lower Extremity Assessment Lower Extremity Assessment: Generalized weakness    Cervical / Trunk Assessment Cervical / Trunk Assessment: Kyphotic  Communication   Communication Communication: No apparent difficulties Cueing Techniques: Verbal cues  Cognition Arousal: Alert Behavior During Therapy: WFL for tasks assessed/performed Overall Cognitive Status: Within Functional Limits for tasks assessed                                          General Comments General comments (skin integrity, edema, etc.): BP: 106/59 seated, BP: 90/60 standing, BP: 99/65 seated after ambulation, BP: 124/59 supine.    Exercises     Assessment/Plan    PT Assessment Patient needs continued PT services  PT Problem List Decreased strength;Decreased range of motion;Decreased activity tolerance;Decreased balance;Decreased mobility;Decreased coordination;Decreased knowledge of use of DME;Decreased safety awareness;Decreased knowledge of precautions;Cardiopulmonary status limiting activity;Pain       PT Treatment Interventions DME instruction;Gait training;Stair training;Functional mobility training;Therapeutic activities;Therapeutic exercise;Balance training;Neuromuscular re-education;Patient/family education    PT Goals (Current goals can be found in the Care Plan section)  Acute Rehab PT Goals Patient Stated Goal: to get better PT Goal Formulation: With patient Time For Goal Achievement: 03/20/23 Potential to Achieve Goals: Good    Frequency Min 1X/week     Co-evaluation                AM-PAC PT "6 Clicks" Mobility  Outcome Measure Help needed turning from your back to your side while in a flat bed without using bedrails?: A Little Help needed moving from lying on your back to sitting on the side of a flat bed without using bedrails?: A Little Help needed moving to and from a bed to a chair (including a wheelchair)?: A Little Help needed standing up from a chair using your arms (e.g., wheelchair or bedside chair)?: A Little Help needed to walk in hospital room?: A Little Help needed climbing 3-5 steps with a railing? : A Lot 6 Click Score: 17    End of Session Equipment Utilized During Treatment: Gait belt Activity Tolerance: Patient tolerated treatment well Patient left: in bed;with call bell/phone within reach;with family/visitor present;with bed alarm set Nurse Communication: Mobility status PT Visit Diagnosis: Unsteadiness on feet (R26.81);Repeated falls (R29.6);Other abnormalities of gait and mobility (R26.89)    Time: 6578-4696 PT Time Calculation (min) (ACUTE ONLY): 29 min   Charges:   PT Evaluation $PT Eval Moderate Complexity: 1 Mod PT Treatments $Gait Training: 8-22 mins PT General Charges $$ ACUTE PT VISIT: 1 Visit         Shela Nevin, PT, DPT Acute Rehab Services [phone]   Gladys Damme 03/06/2023, 12:37 PM

## 2023-03-06 NOTE — Consult Note (Signed)
ELECTROPHYSIOLOGY CONSULT NOTE    Patient ID: [name] [name] MRN: [id number], DOB/AGE: 1945/07/20 77 y.o.  Admit date: 03/04/2023 Date of Consult: 03/06/2023  Primary Physician: [name], MD Primary Cardiologist: Arvilla Meres, MD  Electrophysiologist: Dr. Jimmey Ralph   Referring Provider: Dr. Jerral Ralph   Patient Profile: [name] [name] is a 77 y.o. male with a history of chronic HFrEF s/p ICD, paroxysmal atrial fibrillation/flutter, CAD s/p CABG 2018, LV thrombus, PAD, CKD 3B, DM2 who is being seen today for the evaluation of syncopal episode at the request of Dr. Jerral Ralph.  HPI:  [name] [name] is a 77 y.o. male who presented to [facility] ER on 11/4 with reports of syncope.  The patient reports he felt as though he had a viral illness in the recent weeks. He was notably tired.  He was walking up his stairs and then he woke up several minutes later.  He he fell hitting his head. He had an additional episode the day prior and did not seek care for it.  Patient was admitted by [facility] for further evaluation.  CT head and C-spine were negative for acute findings.  Chest x-ray was negative for pneumonia.  Right hip x-ray negative for fracture.  Respiratory viral panel was negative.  Blood cultures assessed on 11/4 and no growth to date as of 11/6, PCT negative.  Given syncopal episode the patient's ICD was interrogated by industry and EP was asked to evaluate.  The patient had notable atrial arrhythmia since late September/early October.  Orthostatic vital signs were not significantly positive with PT on 11/6.  He was felt to be largely euvolemic.  Patient reports he has not noticed any increase in energy level since CCM placement.   Device interrogation 11/6 per MDT shows that the patient has been in AFL since at least September 2024.  EKG review shows the patient in atrial flutter at least since September 2024.  DCCV was discussed with Dr. Graciela Husbands but was deferred as anticoagulation had to be  held for [facility] implant.  He denies chest pain, palpitations, dyspnea, PND, orthopnea, nausea, vomiting, dizziness, syncope, edema, weight gain, or early satiety.   Labs Potassium3.9 (11/06 2440) Magnesium  2.4 (11/05 0449) Creatinine, ser  2.33* (11/06 0706) PLT  313 (11/06 0706) HGB  10.6* (11/06 0706) WBC 17.5* (11/06 0706) Troponin I (High Sensitivity)25* (11/04 2120).    Past Medical History:  Diagnosis Date   AICD (automatic cardioverter/defibrillator) present    Arthritis    Bladder cancer (HCC)    Borderline glaucoma    Chronic systolic (congestive) heart failure (HCC)    Coronary artery disease    Diabetes mellitus type 2, controlled (HCC) ORAL MED   Essential hypertension    Heart murmur    Hepatitis    Medical history non-contributory    Paroxysmal A-fib (HCC)    Paroxysmal A-fib (HCC) 08/20/2020   Peripheral vascular disease (HCC)    Pneumonia      Surgical History:  Past Surgical History:  Procedure Laterality Date   CARDIOVERSION N/A 08/10/2021   Procedure: CARDIOVERSION;  Surgeon: Dolores Patty, MD;  Location: [facility] ENDOSCOPY;  Service: Cardiovascular;  Laterality: N/A;   CARDIOVERSION N/A 11/09/2022   Procedure: CARDIOVERSION;  Surgeon: Maisie Fus, MD;  Location: [facility] INVASIVE CV LAB;  Service: Cardiovascular;  Laterality: N/A;   CAROTID DUPLEX SCAN  07-26-2010   BILATERAL ICA  STENOSIS 1% - 39%   CCM GENERATOR INSERTION N/A 01/04/2023   Procedure: CCM GENERATOR INSERTION;  Surgeon: Duke Salvia, MD;  Location: [facility] INVASIVE CV LAB;  Service: Cardiovascular;  Laterality: N/A;   CHOLECYSTECTOMY N/A 08/22/2020   Procedure: LAPAROSCOPIC CHOLECYSTECTOMY;  Surgeon: Emelia Loron, MD;  Location: [facility] OR;  Service: General;  Laterality: N/A;   CORONARY ARTERY BYPASS GRAFT N/A 04/08/2017   Procedure: CORONARY ARTERY BYPASS GRAFTING (CABG) TIMES TWO USING LEFT INTERNAL MAMMARY ARTERY AND LEFT SAPHENOUS LEG VEIN HARVESTED ENDOSCOPICALLY.  LEG VEIN ALSO HARVESTED  FROM THE RIGHT LEG;  Surgeon: Alleen Borne, MD;  Location: [facility] OR;  Service: Open Heart Surgery;  Laterality: N/A;   CYSTOSCOPY WITH BIOPSY N/A 08/01/2012   Procedure: CYSTOSCOPY WITH BIOPSY BLADDER BIOPSY   ;  Surgeon: Antony Haste, MD;  Location: [facility];  Service: Urology;  Laterality: N/A;   ERCP N/A 08/21/2020   Procedure: ENDOSCOPIC RETROGRADE CHOLANGIOPANCREATOGRAPHY (ERCP);  Surgeon: Jeani Hawking, MD;  Location: [facility] ENDOSCOPY;  Service: Endoscopy;  Laterality: N/A;   FULGURATION OF BLADDER TUMOR N/A 08/01/2012   Procedure: FULGURATION OF BLADDER TUMOR;  Surgeon: Antony Haste, MD;  Location: [facility];  Service: Urology;  Laterality: N/A;   ICD IMPLANT  11/20/2017   ICD IMPLANT N/A 11/20/2017   Procedure: ICD IMPLANT;  Surgeon: Duke Salvia, MD;  Location: [facility] INVASIVE CV LAB;  Service: Cardiovascular;  Laterality: N/A;   LEFT HEART CATH AND CORONARY ANGIOGRAPHY N/A 03/31/2017   Procedure: LEFT HEART CATH AND CORONARY ANGIOGRAPHY;  Surgeon: Marykay Lex, MD;  Location: [facility] INVASIVE CV LAB;  Service: Cardiovascular;  Laterality: N/A;   LUMBAR LAMINECTOMY/DECOMPRESSION MICRODISCECTOMY Bilateral 06/16/2019   Procedure: Bilateral Lumbar Four-Five Laminectomy and Foraminotomy;  Surgeon: Julio Sicks, MD;  Location: [facility] OR;  Service: Neurosurgery;  Laterality: Bilateral;  posterior   REMOVAL OF STONES  08/21/2020   Procedure: REMOVAL OF STONES;  Surgeon: Jeani Hawking, MD;  Location: [facility] ENDOSCOPY;  Service: Endoscopy;;   RIGHT HEART CATH Right 03/31/2017   Procedure: RIGHT HEART CATH;  Surgeon: Marykay Lex, MD;  Location: [facility] INVASIVE CV LAB;  Service: Cardiovascular;  Laterality: Right;   SPHINCTEROTOMY  08/21/2020   Procedure: SPHINCTEROTOMY;  Surgeon: Jeani Hawking, MD;  Location: [facility] ENDOSCOPY;  Service: Endoscopy;;   TEE WITHOUT CARDIOVERSION N/A 04/08/2017   Procedure: TRANSESOPHAGEAL ECHOCARDIOGRAM (TEE);  Surgeon: Alleen Borne,  MD;  [facility] OR;  Service: Open Heart Surgery;  Laterality: N/A;   TRANSURETHRAL RESECTION OF BLADDER TUMOR  05/29/2011   Procedure: TRANSURETHRAL RESECTION OF BLADDER TUMOR (TURBT);  Surgeon: Antony Haste, MD;  Location: [facility];  Service: Urology;  Laterality: N/A;   VENTRICULAR ASSIST DEVICE INSERTION Right 03/31/2017   Procedure: VENTRICULAR ASSIST DEVICE INSERTION;  Surgeon: Marykay Lex, MD;  Location: [facility] INVASIVE CV LAB;  Service: Cardiovascular;  Laterality: Right;     Medications Prior to Admission  Medication Sig Dispense Refill Last Dose   acetaminophen (TYLENOL) 500 MG tablet Take 1,000 mg by mouth every 6 (six) hours as needed for mild pain (pain score 1-3), headache or fever.   03/04/2023   amiodarone (PACERONE) 200 MG tablet Take 1 tablet (200 mg total) by mouth 2 (two) times daily. 180 tablet 3 03/04/2023   amLODipine (NORVASC) 5 MG tablet Take 1 tablet (5 mg total) by mouth daily. 90 tablet 3 03/04/2023   atorvastatin (LIPITOR) 80 MG tablet TAKE 1 TABLET BY MOUTH EVERY DAY 90 tablet 3 03/04/2023   cyanocobalamin (VITAMIN B12) 250 MCG tablet Take 750 mcg by mouth daily.  03/04/2023   ELIQUIS 5 MG TABS tablet TAKE 1 TABLET TWICE A DAY 180 tablet 3 03/04/2023 at 1900   oxymetazoline (DRISTAN) 0.05 % nasal spray Place 1 spray into both nostrils at bedtime.   Past Week   sacubitril-valsartan (ENTRESTO) 97-103 MG Take 1 tablet by mouth 2 (two) times daily. Take 1/2 tablet BID   03/04/2023   torsemide (DEMADEX) 20 MG tablet Take 1 tablet (20 mg total) by mouth daily. 90 tablet 3 03/04/2023   zolpidem (AMBIEN) 10 MG tablet Take 5 mg by mouth at bedtime.   Past Week   sacubitril-valsartan (ENTRESTO) 49-51 MG Take 1 tablet by mouth 2 (two) times daily. 60 tablet 3     Inpatient Medications:   amiodarone  200 mg Oral BID   apixaban  5 mg Oral BID   atorvastatin  80 mg Oral Daily   insulin aspart  0-6 Units Subcutaneous TID WC   oxymetazoline  1 spray  Each Nare QHS   zolpidem  5 mg Oral QHS    Allergies:  Allergies  Allergen Reactions   Spironolactone Other (See Comments)    Painful gynecomastia   Empagliflozin Rash    Rash at the groin Jardiance   Niacin And Related Hives    Family History  Problem Relation Age of Onset   Heart attack Sister    Heart disease Sister      Physical Exam: Vitals:   03/05/23 2000 03/06/23 0000 03/06/23 0400 03/06/23 0555  BP: (!) 118/59 133/70 123/69   Pulse: 95 (!) 104  98  Resp: 18 17 20 18   Temp: 98.1 F (36.7 C) 98.4 F (36.9 C) 98 F (36.7 C)   TempSrc: Oral Oral Oral   SpO2: 96% 92%  93%  Weight:      Height:        GEN- pleasant elderly male in NAD, A&O x 3, normal affect HEENT: Normocephalic, atraumatic Lungs- CTAB, Normal effort.  Heart- Regular rate and rhythm, No M/G/R.  GI- Soft, NT, ND.  Extremities- No clubbing, cyanosis, no significant edema   Radiology/Studies: DG Hip Unilat W or Wo Pelvis 1 View Right  Result Date: 03/05/2023 CLINICAL DATA:  Multiple falls, right hip pain EXAM: DG HIP (WITH OR WITHOUT PELVIS) 1V RIGHT COMPARISON:  12/15/2018 FINDINGS: Mild symmetric degenerative changes in the hips with spurring. SI joints symmetric. No acute bony abnormality. Specifically, no fracture, subluxation, or dislocation. IMPRESSION: No acute bony abnormality. Electronically Signed   By: Charlett Nose M.D.   On: 03/05/2023 00:59   CT HEAD WO CONTRAST ( )  Result Date: 03/04/2023 CLINICAL DATA:  Head trauma, minor (Age >= 65y); Neck trauma (Age >= 65y), fall, chronic anticoagulation EXAM: CT HEAD WITHOUT CONTRAST CT CERVICAL SPINE WITHOUT CONTRAST TECHNIQUE: Multidetector CT imaging of the head and cervical spine was performed following the standard protocol without intravenous contrast. Multiplanar CT image reconstructions of the cervical spine were also generated. RADIATION DOSE REDUCTION: This exam was performed according to the departmental dose-optimization program  which includes automated exposure control, adjustment of the mA and/or kV according to patient size and/or use of iterative reconstruction technique. COMPARISON:  None Available. FINDINGS: CT HEAD FINDINGS Brain: No evidence of acute infarction, hemorrhage, hydrocephalus, extra-axial collection or mass lesion/mass effect. Vascular: No hyperdense vessel or unexpected calcification. Skull: Normal. Negative for fracture or focal lesion. Sinuses/Orbits: No acute finding. Other: Mastoid air cells and middle ear cavities are clear. Small right parietal scalp hematoma. CT CERVICAL SPINE FINDINGS Alignment: Normal. Skull  base and vertebrae: Craniocervical alignment is normal. The atlantodental interval is not widened. No acute fracture of the cervical spine. Vertebral body height is preserved. Soft tissues and spinal canal: No prevertebral fluid or swelling. No visible canal hematoma. Disc levels: There is endplate remodeling throughout the cervical spine in keeping with changes of diffuse moderate degenerative disc disease. Prevertebral soft tissues are not thickened on sagittal reformats. The spinal canal is widely patent. No significant neuroforaminal narrowing. Upper chest: 19 mm nodule is seen within the right thyroid gland demonstrating punctate internal calcification and heterogeneous internal enhancement. This is not well characterized on this examination. Visualized lung apices demonstrate at least mild emphysematous. Other: None IMPRESSION: 1. No acute intracranial abnormality. No calvarial fracture. Small right parietal scalp hematoma. 2. No acute fracture or listhesis of the cervical spine. 3. 19 mm nodule within the right thyroid gland, not well characterized on this examination. Recommend thyroid US (ref: J Am Coll Radiol. 2015 Feb;12(2): 143-50). Emphysema (ICD10-J43.9). Electronically Signed   By: Helyn Numbers M.D.   On: 03/04/2023 21:45   CT Cervical Spine Wo Contrast  Result Date: 03/04/2023 CLINICAL  DATA:  Head trauma, minor (Age >= 65y); Neck trauma (Age >= 65y), fall, chronic anticoagulation EXAM: CT HEAD WITHOUT CONTRAST CT CERVICAL SPINE WITHOUT CONTRAST TECHNIQUE: Multidetector CT imaging of the head and cervical spine was performed following the standard protocol without intravenous contrast. Multiplanar CT image reconstructions of the cervical spine were also generated. RADIATION DOSE REDUCTION: This exam was performed according to the departmental dose-optimization program which includes automated exposure control, adjustment of the mA and/or kV according to patient size and/or use of iterative reconstruction technique. COMPARISON:  None Available. FINDINGS: CT HEAD FINDINGS Brain: No evidence of acute infarction, hemorrhage, hydrocephalus, extra-axial collection or mass lesion/mass effect. Vascular: No hyperdense vessel or unexpected calcification. Skull: Normal. Negative for fracture or focal lesion. Sinuses/Orbits: No acute finding. Other: Mastoid air cells and middle ear cavities are clear. Small right parietal scalp hematoma. CT CERVICAL SPINE FINDINGS Alignment: Normal. Skull base and vertebrae: Craniocervical alignment is normal. The atlantodental interval is not widened. No acute fracture of the cervical spine. Vertebral body height is preserved. Soft tissues and spinal canal: No prevertebral fluid or swelling. No visible canal hematoma. Disc levels: There is endplate remodeling throughout the cervical spine in keeping with changes of diffuse moderate degenerative disc disease. Prevertebral soft tissues are not thickened on sagittal reformats. The spinal canal is widely patent. No significant neuroforaminal narrowing. Upper chest: 19 mm nodule is seen within the right thyroid gland demonstrating punctate internal calcification and heterogeneous internal enhancement. This is not well characterized on this examination. Visualized lung apices demonstrate at least mild emphysematous. Other: None  IMPRESSION: 1. No acute intracranial abnormality. No calvarial fracture. Small right parietal scalp hematoma. 2. No acute fracture or listhesis of the cervical spine. 3. 19 mm nodule within the right thyroid gland, not well characterized on this examination. Recommend thyroid US (ref: J Am Coll Radiol. 2015 Feb;12(2): 143-50). Emphysema (ICD10-J43.9). Electronically Signed   By: Helyn Numbers M.D.   On: 03/04/2023 21:45   DG Chest Portable 1 View  Result Date: 03/04/2023 CLINICAL DATA:  Syncope, fall. EXAM: PORTABLE CHEST 1 VIEW COMPARISON:  01/16/2023. FINDINGS: The heart is enlarged and the mediastinal contour is within normal limits. There is atherosclerotic calcification of the aorta. Interstitial prominence is present bilaterally and likely chronic. No consolidation, effusion, or pneumothorax. Multi lead pacemaker devices are present bilaterally. Sternotomy wires are noted. No  acute osseous abnormality is seen. IMPRESSION: 1. No active disease. 2. Cardiomegaly. 3. Chronic interstitial changes bilaterally. Electronically Signed   By: Thornell Sartorius M.D.   On: 03/04/2023 21:44    EKG: (personally reviewed) EKG 11/13/2022 > SB with 1AVB EKG 01/04/2023 > AFL 99bpm  EKG 03/05/23 > AFL 100 bpm, IVCD  TELEMETRY: AFL 90's (personally reviewed)  STUDIES: cMRI 2019 > mildly dilated LV with wall motion abnormalities, laminated apical thrombus ECHO 09/06/22 > LVEF 25-30%, global hypokinesis with apical akinesis, LV has severely decreased function, GII DD, RV systolic function mildly reduced   DEVICE HISTORY:  MDT Single Chamber ICD, implant 10/2017 for HFrEF / cardiomyopathy  CCM Generator Insertion 01/04/23  Assessment/Plan:  Atrial Flutter / Paroxysmal Atrial Fibrillation  In AFL/AF since 12/2022.  Subjectively feels fatigued.  -NPO after MN for TEE (given hx of LA thrombus) and DCCV in am  -pt confirms no missed doses of OAC -continue amiodarone 200mg  BID  -continue eliquis 5mg  BID, dose appropriate  for age / wt  Syncope  -No clear cause of syncope on device interrogation (VT/VF) -further work up per primary   For questions or updates, please contact [facility] Please consult [email or web address] for contact info under Cardiology/STEMI.  Signed, Canary Brim, MSN, APRN, NP-C, AGACNP-BC [facility] - Electrophysiology  03/06/2023, 4:40 PM

## 2023-03-07 ENCOUNTER — Inpatient Hospital Stay (HOSPITAL_COMMUNITY): Payer: Medicare Other

## 2023-03-07 ENCOUNTER — Inpatient Hospital Stay (HOSPITAL_COMMUNITY): Payer: Medicare Other | Admitting: Certified Registered"

## 2023-03-07 ENCOUNTER — Encounter (HOSPITAL_COMMUNITY): Payer: Self-pay | Admitting: Internal Medicine

## 2023-03-07 ENCOUNTER — Encounter (HOSPITAL_COMMUNITY)
Admission: EM | Disposition: A | Discharge status: Home Health | Payer: Self-pay | Source: Home / Self Care | Attending: Internal Medicine

## 2023-03-07 DIAGNOSIS — R6510 Systemic inflammatory response syndrome (SIRS) of non-infectious origin without acute organ dysfunction: Secondary | ICD-10-CM | POA: Diagnosis not present

## 2023-03-07 DIAGNOSIS — R55 Syncope and collapse: Secondary | ICD-10-CM | POA: Diagnosis not present

## 2023-03-07 DIAGNOSIS — I959 Hypotension, unspecified: Secondary | ICD-10-CM | POA: Diagnosis not present

## 2023-03-07 DIAGNOSIS — I4891 Unspecified atrial fibrillation: Secondary | ICD-10-CM

## 2023-03-07 DIAGNOSIS — N179 Acute kidney failure, unspecified: Secondary | ICD-10-CM | POA: Diagnosis not present

## 2023-03-07 DIAGNOSIS — I4892 Unspecified atrial flutter: Secondary | ICD-10-CM

## 2023-03-07 HISTORY — PX: TRANSESOPHAGEAL ECHOCARDIOGRAM (CATH LAB): EP1270

## 2023-03-07 HISTORY — PX: CARDIOVERSION: EP1203

## 2023-03-07 LAB — GLUCOSE, CAPILLARY
Glucose-Capillary: 146 mg/dL — ABNORMAL HIGH (ref 70–99)
Glucose-Capillary: 124 mg/dL — ABNORMAL HIGH (ref 70–99)
Glucose-Capillary: 181 mg/dL — ABNORMAL HIGH (ref 70–99)

## 2023-03-07 LAB — BASIC METABOLIC PANEL
Anion gap: 8 (ref 5–15)
BUN: 37 mg/dL — ABNORMAL HIGH (ref 8–23)
CO2: 19 mmol/L — ABNORMAL LOW (ref 22–32)
Calcium: 8.2 mg/dL — ABNORMAL LOW (ref 8.9–10.3)
Chloride: 104 mmol/L (ref 98–111)
Creatinine, Ser: 2.14 mg/dL — ABNORMAL HIGH (ref 0.61–1.24)
GFR, Estimated: 31 mL/min — ABNORMAL LOW (ref >60)
Glucose, Bld: 149 mg/dL — ABNORMAL HIGH (ref 70–99)
Potassium: 4.3 mmol/L (ref 3.5–5.1)
Sodium: 131 mmol/L — ABNORMAL LOW (ref 135–145)

## 2023-03-07 LAB — CBC
HCT: 34.7 % — ABNORMAL LOW (ref 39.0–52.0)
Hemoglobin: 11.4 g/dL — ABNORMAL LOW (ref 13.0–17.0)
MCH: 27.7 pg (ref 26.0–34.0)
MCHC: 32.9 g/dL (ref 30.0–36.0)
MCV: 84.4 fL (ref 80.0–100.0)
Platelets: 369 10*3/uL (ref 150–400)
RBC: 4.11 MIL/uL — ABNORMAL LOW (ref 4.22–5.81)
RDW: 16 % — ABNORMAL HIGH (ref 11.5–15.5)
WBC: 14.9 10*3/uL — ABNORMAL HIGH (ref 4.0–10.5)
nRBC: 0 % (ref 0.0–0.2)

## 2023-03-07 LAB — UREA NITROGEN, URINE: Urea Nitrogen, Ur: 948 mg/dL

## 2023-03-07 LAB — ECHO TEE

## 2023-03-07 SURGERY — TRANSESOPHAGEAL ECHOCARDIOGRAM (TEE) (CATHLAB)
Anesthesia: Monitor Anesthesia Care

## 2023-03-07 MED ORDER — DEXMEDETOMIDINE HCL IN NACL 80 MCG/20ML IV SOLN
INTRAVENOUS | Status: DC | PRN
  Administered 2023-03-07: 4 ug via INTRAVENOUS
  Administered 2023-03-07: 8 ug via INTRAVENOUS

## 2023-03-07 MED ORDER — PHENYLEPHRINE 80 MCG/ML (10ML) SYRINGE FOR IV PUSH (FOR BLOOD PRESSURE SUPPORT)
PREFILLED_SYRINGE | INTRAVENOUS | Status: DC | PRN
  Administered 2023-03-07 (×4): 80 ug via INTRAVENOUS

## 2023-03-07 MED ORDER — PROPOFOL 500 MG/50ML IV EMUL
INTRAVENOUS | Status: DC | PRN
  Administered 2023-03-07: 100 ug/kg/min via INTRAVENOUS

## 2023-03-07 MED ORDER — LIDOCAINE 2% (20 MG/ML) 5 ML SYRINGE
INTRAMUSCULAR | Status: DC | PRN
  Administered 2023-03-07: 40 mg via INTRAVENOUS

## 2023-03-07 MED ORDER — FUROSEMIDE 10 MG/ML IJ SOLN
40.0000 mg | Freq: Once | INTRAMUSCULAR | Status: AC
  Administered 2023-03-07: 40 mg via INTRAVENOUS
  Filled 2023-03-07: qty 4

## 2023-03-07 MED ORDER — PHENYLEPHRINE HCL-NACL 20-0.9 MG/250ML-% IV SOLN
INTRAVENOUS | Status: DC | PRN
  Administered 2023-03-07: 25 ug/min via INTRAVENOUS

## 2023-03-07 MED ORDER — AMIODARONE HCL 200 MG PO TABS
200.0000 mg | ORAL_TABLET | Freq: Every day | ORAL | Status: DC
  Administered 2023-03-08: 200 mg via ORAL
  Filled 2023-03-07: qty 1

## 2023-03-07 MED ORDER — SODIUM CHLORIDE 0.9 % IV SOLN
INTRAVENOUS | Status: DC
  Administered 2023-03-07: 20 mL/h via INTRAVENOUS

## 2023-03-07 MED ORDER — PROPOFOL 10 MG/ML IV BOLUS
INTRAVENOUS | Status: DC | PRN
  Administered 2023-03-07 (×3): 20 mg via INTRAVENOUS

## 2023-03-07 MED ORDER — SACUBITRIL-VALSARTAN 24-26 MG PO TABS
1.0000 | ORAL_TABLET | Freq: Two times a day (BID) | ORAL | Status: DC
  Administered 2023-03-08: 1 via ORAL
  Filled 2023-03-07: qty 1

## 2023-03-07 SURGICAL SUPPLY — 1 items: PAD DEFIB RADIO PHYSIO CONN (PAD) ×1 IMPLANT

## 2023-03-07 NOTE — Progress Notes (Signed)
PROGRESS NOTE        PATIENT DETAILS Name: KAMARII CARTON Age: 77 y.o. Sex: male Date of Birth: 1946/02/15 Admit Date: 03/04/2023 Admitting Physician Angie Fava, DO WUJ:[id number], [name], MD  Brief Summary: Patient is a 77 y.o.  male with history of chronic HFrEF-s/p ICD, CAD s/p CABG 2018, persistent atrial fibrillation on Eliquis, LV thrombus, DM-2, CKD stage IIIb, PAD presented with several days history of subjective fever/malaise/weakness "felt like I had the flu"-and sustained a syncopal episode on the day of admission-subsequently brought to the ED and admitted to the hospitalist service.  See below for further details.  Significant events: 11/4>> admit to [facility]  Significant studies: 11/4>> CT head: No acute intracranial abnormality 11/4>> CT C-spine: No fracture 11/4>> CXR: No PNA 11/5>> x-ray hip right: No fracture  Significant microbiology data: 11/4>> blood cultures: No growth 11/5>> COVID PCR: Negative 11/5>> respiratory virus panel: Negative  Procedures: None  Consults: None  Subjective: Significantly better overnight-spouse at bedside.  No complaints-requesting that he be sent home-after TEE/cardioversion.  Objective: Vitals: Blood pressure 117/75, pulse (!) 104, temperature 98.5 F (36.9 C), temperature source Oral, resp. rate (!) 22, height 6\' 2"  (1.88 m), weight 91.2 kg, SpO2 92%.   Exam: Gen Exam:Alert awake-not in any distress HEENT:atraumatic, normocephalic Chest: B/L clear to auscultation anteriorly CVS:S1S2 regular Abdomen:soft non tender, non distended Extremities:no edema Neurology: Non focal Skin: no rash  Pertinent Labs/Radiology:    Latest Ref Rng & Units 03/07/2023    6:07 AM 03/06/2023    7:06 AM 03/05/2023    4:49 AM  CBC  WBC 4.0 - 10.5 K/uL 14.9  17.5  20.4   Hemoglobin 13.0 - 17.0 g/dL 78.2  95.6  21.3   Hematocrit 39.0 - 52.0 % 34.7  32.4  34.4   Platelets 150 - 400 K/uL 369  313  310     Lab  Results  Component Value Date   NA 131 (L) 03/07/2023   K 4.3 03/07/2023   CL 104 03/07/2023   CO2 19 (L) 03/07/2023    Assessment/Plan: SIRS/leukocytosis ?  Viral etiology All cultures/imaging studies negative-no evidence of infection by physical exam Remains stable All antibiotics discontinued on 11/6 Continue to observe/monitor but he is requesting discharge home later today.  Syncope Suspect this was secondary to orthostatic hypotension-probably from recent viral illness. Physical therapy did check orthostatic vital signs-he was already sitting-it was positive-this is after overnight IV fluid hydration. ICD interrogation completed by EP no evidence of arrhythmias Although slightly orthostatic yesterday-he was asymptomatic. Continue compression stockings.    Chronic HFrEF-s/p AICD Volume status stable-but thinks he is putting on fluid- IV Lasix x 1 today Resume Demadex/Entresto 11/8.    Persistent atrial fibrillation Rate controlled EP planning TEE and cardioversion today. Remains on amiodarone/Eliquis  CAD s/p CABG No anginal symptoms Not on aspirin as on Eliquis Continue statin  AKI on CKD stage IIIb AKI likely hemodynamically mediated AKI improved-creatinine back to baseline.   Hyponatremia Secondary to IV fluids IV fluids discontinued 11/6-fluid restriction started-sodium improved to 131 Resuming diuretics today. Follow electrolytes periodically.  DM-2 (A1c 6.9 on 11/5) CBG stable on SSI  Recent Labs    03/06/23 1637 03/06/23 2123 03/07/23 0754  GLUCAP 148* 209* 146*     19 mm right thyroid gland nodule Incidentally seen on CT C-spine Stable for outpatient  follow-up by PCP  BMI: Estimated body mass index is 25.81 kg/m as calculated from the following:   Height as of this encounter: 6\' 2"  (1.88 m).   Weight as of this encounter: 91.2 kg.   Code status:   Code Status: Full Code   DVT Prophylaxis: Place TED hose Start: 03/06/23 1151 SCDs  Start: 03/05/23 0210 apixaban (ELIQUIS) tablet 5 mg    Family Communication: Spouse at bedside  Disposition Plan: Status is: Inpatient Remains inpatient appropriate because: Severity of illness   Planned Discharge Destination:Home   Diet: Diet Order             Diet NPO time specified  Diet effective midnight                     Antimicrobial agents: Anti-infectives (From admission, onward)    Start     Dose/Rate Route Frequency Ordered Stop   03/05/23 2200  cefTRIAXone (ROCEPHIN) 1 g in sodium chloride 0.9 % 100 mL IVPB  Status:  Discontinued        1 g 200 mL/hr over 30 Minutes Intravenous Every 24 hours 03/05/23 0211 03/05/23 0859   03/05/23 1145  ceFEPIme (MAXIPIME) 2 g in sodium chloride 0.9 % 100 mL IVPB  Status:  Discontinued        2 g 200 mL/hr over 30 Minutes Intravenous Every 12 hours 03/05/23 1139 03/06/23 1109   03/05/23 1140  vancomycin variable dose per unstable renal function (pharmacist dosing)  Status:  Discontinued         Does not apply See admin instructions 03/05/23 1140 03/06/23 1102   03/04/23 2330  ceFEPIme (MAXIPIME) 2 g in sodium chloride 0.9 % 100 mL IVPB        2 g 200 mL/hr over 30 Minutes Intravenous  Once 03/04/23 2317 03/05/23 0001   03/04/23 2330  metroNIDAZOLE (FLAGYL) IVPB 500 mg        500 mg 100 mL/hr over 60 Minutes Intravenous  Once 03/04/23 2317 03/05/23 0131   03/04/23 2330  vancomycin (VANCOCIN) IVPB 1000 mg/200 mL premix  Status:  Discontinued        1,000 mg 200 mL/hr over 60 Minutes Intravenous  Once 03/04/23 2317 03/04/23 2320   03/04/23 2330  vancomycin (VANCOREADY) IVPB 2000 mg/400 mL        2,000 mg 200 mL/hr over 120 Minutes Intravenous  Once 03/04/23 2320 03/05/23 0354        MEDICATIONS: Scheduled Meds:  amiodarone  200 mg Oral BID   apixaban  5 mg Oral BID   atorvastatin  80 mg Oral Daily   insulin aspart  0-6 Units Subcutaneous TID WC   oxymetazoline  1 spray Each Nare QHS   zolpidem  5 mg Oral QHS    Continuous Infusions:   PRN Meds:.acetaminophen **OR** acetaminophen, melatonin, ondansetron (ZOFRAN) IV   I have personally reviewed following labs and imaging studies  LABORATORY DATA: CBC: Recent Labs  Lab 03/04/23 2120 03/05/23 0449 03/06/23 0706 03/07/23 0607  WBC 22.1* 20.4* 17.5* 14.9*  NEUTROABS  --  17.2*  --   --   HGB 12.5* 10.9* 10.6* 11.4*  HCT 39.0 34.4* 32.4* 34.7*  MCV 88.4 88.9 85.9 84.4  PLT 336 310 313 369    Basic Metabolic Panel: Recent Labs  Lab 03/04/23 2120 03/05/23 0449 03/06/23 0706 03/07/23 0607  NA 131* 130* 128* 131*  K 4.3 4.1 3.9 4.3  CL 99 102 102 104  CO2 21*  21* 18* 19*  GLUCOSE 209* 167* 219* 149*  BUN 39* 35* 37* 37*  CREATININE 2.52* 2.44* 2.33* 2.14*  CALCIUM 8.1* 7.5* 7.8* 8.2*  MG  --  2.4  --   --     GFR: Estimated Creatinine Clearance: 33.6 mL/min (A) (by C-G formula based on SCr of 2.14 mg/dL (H)).  Liver Function Tests: Recent Labs  Lab 03/04/23 2120 03/05/23 0449 03/06/23 0706  AST 53* 49* 57*  ALT 79* 71* 75*  ALKPHOS 84 70 76  BILITOT 1.4* 1.5* 0.9  PROT 5.9* 5.1* 5.3*  ALBUMIN 2.7* 2.3* 2.2*   No results for input(s): "LIPASE", "AMYLASE" in the last 168 hours. No results for input(s): "AMMONIA" in the last 168 hours.  Coagulation Profile: No results for input(s): "INR", "PROTIME" in the last 168 hours.  Cardiac Enzymes: No results for input(s): "CKTOTAL", "CKMB", "CKMBINDEX", "TROPONINI" in the last 168 hours.  BNP (last 3 results) Recent Labs    09/25/22 0953  PROBNP 2,187*    Lipid Profile: No results for input(s): "CHOL", "HDL", "LDLCALC", "TRIG", "CHOLHDL", "LDLDIRECT" in the last 72 hours.  Thyroid Function Tests: Recent Labs    03/05/23 0449  TSH 0.838    Anemia Panel: No results for input(s): "VITAMINB12", "FOLATE", "FERRITIN", "TIBC", "IRON", "RETICCTPCT" in the last 72 hours.  Urine analysis:    Component Value Date/Time   COLORURINE YELLOW 03/05/2023 0250    APPEARANCEUR CLEAR 03/05/2023 0250   LABSPEC 1.023 03/05/2023 0250   PHURINE 5.0 03/05/2023 0250   GLUCOSEU NEGATIVE 03/05/2023 0250   HGBUR NEGATIVE 03/05/2023 0250   BILIRUBINUR NEGATIVE 03/05/2023 0250   KETONESUR NEGATIVE 03/05/2023 0250   PROTEINUR 100 (A) 03/05/2023 0250   NITRITE NEGATIVE 03/05/2023 0250   LEUKOCYTESUR NEGATIVE 03/05/2023 0250    Sepsis Labs: Lactic Acid, Venous    Component Value Date/Time   LATICACIDVEN 1.3 03/05/2023 0607    MICROBIOLOGY: Recent Results (from the past 240 hour(s))  Culture, blood (routine x 2)     Status: None (Preliminary result)   Collection Time: 03/04/23 10:46 PM   Specimen: BLOOD RIGHT FOREARM  Result Value Ref Range Status   Specimen Description BLOOD RIGHT FOREARM  Final   Special Requests   Final    BOTTLES DRAWN AEROBIC AND ANAEROBIC Blood Culture results may not be optimal due to an excessive volume of blood received in culture bottles   Culture   Final    NO GROWTH 3 DAYS Performed at [facility], [address] [place], [place] [address]    Report Status PENDING  Incomplete  Culture, blood (routine x 2)     Status: None (Preliminary result)   Collection Time: 03/04/23 11:14 PM   Specimen: BLOOD LEFT FOREARM  Result Value Ref Range Status   Specimen Description BLOOD LEFT FOREARM  Final   Special Requests   Final    BOTTLES DRAWN AEROBIC AND ANAEROBIC Blood Culture adequate volume   Culture   Final    NO GROWTH 2 DAYS Performed at [facility], [address], [place], [place] [address]    Report Status PENDING  Incomplete  SARS Coronavirus 2 by RT PCR (hospital order, performed in [facility] hospital lab) *cepheid single result test* Anterior Nasal Swab     Status: None   Collection Time: 03/05/23  2:11 AM   Specimen: Anterior Nasal Swab  Result Value Ref Range Status   SARS Coronavirus 2 by RT PCR NEGATIVE NEGATIVE Final    Comment:  Performed at [facility], [address]  [place], [place] [address]  Respiratory (~20 pathogens) panel by PCR     Status: None   Collection Time: 03/05/23 11:14 AM   Specimen: Nasopharyngeal Swab; Respiratory  Result Value Ref Range Status   Adenovirus NOT DETECTED NOT DETECTED Final   Coronavirus 229E NOT DETECTED NOT DETECTED Final    Comment: (NOTE) The Coronavirus on the Respiratory Panel, DOES NOT test for the novel  Coronavirus (2019 nCoV)    Coronavirus HKU1 NOT DETECTED NOT DETECTED Final   Coronavirus NL63 NOT DETECTED NOT DETECTED Final   Coronavirus OC43 NOT DETECTED NOT DETECTED Final   Metapneumovirus NOT DETECTED NOT DETECTED Final   Rhinovirus / Enterovirus NOT DETECTED NOT DETECTED Final   Influenza A NOT DETECTED NOT DETECTED Final   Influenza B NOT DETECTED NOT DETECTED Final   Parainfluenza Virus 1 NOT DETECTED NOT DETECTED Final   Parainfluenza Virus 2 NOT DETECTED NOT DETECTED Final   Parainfluenza Virus 3 NOT DETECTED NOT DETECTED Final   Parainfluenza Virus 4 NOT DETECTED NOT DETECTED Final   Respiratory Syncytial Virus NOT DETECTED NOT DETECTED Final   Bordetella pertussis NOT DETECTED NOT DETECTED Final   Bordetella Parapertussis NOT DETECTED NOT DETECTED Final   Chlamydophila pneumoniae NOT DETECTED NOT DETECTED Final   Mycoplasma pneumoniae NOT DETECTED NOT DETECTED Final    Comment: Performed at [facility], [address] [place], [place] [address]  MRSA Next Gen by PCR, Nasal     Status: None   Collection Time: 03/05/23 11:50 PM   Specimen: Nasal Mucosa; Nasal Swab  Result Value Ref Range Status   MRSA by PCR Next Gen NOT DETECTED NOT DETECTED Final    Comment: (NOTE) The GeneXpert MRSA Assay (FDA approved for NASAL specimens only), is one component of a comprehensive MRSA colonization surveillance program. It is not intended to diagnose MRSA infection nor to guide or monitor treatment for MRSA infections. Test performance is not FDA approved in patients less than 69  years old. Performed at [facility], [address], [place], [place] [address]     RADIOLOGY STUDIES/RESULTS: No results found.   LOS: 2 days   Jeoffrey Massed, MD  [facility]    To contact the attending provider between 7A-7P or the covering provider during after hours 7P-7A, please log into the web site [email or web address] and access using universal [place] password for that web site. If you do not have the password, please call the hospital operator.  03/07/2023, 10:50 AM

## 2023-03-07 NOTE — Progress Notes (Signed)
Occupational Therapy Treatment Patient Details Name: [name] [name] MRN: [id number] DOB: [birth year] Today's Date: 03/07/2023   History of present illness 77 year old male with history of chronic systolic/diastolic heart failure status post ICD, paroxysmal atrial fibrillation complicated by sick sinus syndrome status post pacemaker, is being admitted with SIRS criteria, generalized weakness.  He presented with complaint of single presyncopal episode resulting in a fall in which he hit his head.   OT comments  Patient with continued R shoulder pain limiting ROM and ability to bear weight.  Requested R shoulder x-ray via MD.  OT will await the results of the x-ray and proceed with HEP from there.  Post acute OT will hinge on the shoulder x-ray as well.        If plan is discharge home, recommend the following:  Assist for transportation;Assistance with cooking/housework;A little help with walking and/or transfers;A little help with bathing/dressing/bathroom   Equipment Recommendations  BSC/3in1    Recommendations for Other Services      Precautions / Restrictions Precautions Precautions: Fall Precaution Comments: R shoulder and R hip discomfort post fall.  Xray to R hip has (-) for fx.  No formal x-ray to shoulder has been completed - requested 11/7. Restrictions Weight Bearing Restrictions: No       Mobility Bed Mobility Overal bed mobility: Needs Assistance Bed Mobility: Supine to Sit, Sit to Supine     Supine to sit: Min assist, Mod assist Sit to supine: Contact guard assist, Min assist        Transfers                         Balance Overall balance assessment: Needs assistance Sitting-balance support: Feet supported Sitting balance-Leahy Scale: Fair   Postural control: Right lateral lean                                 ADL either performed or assessed with clinical judgement   ADL                       Lower Body Dressing:  Moderate assistance;Sit to/from stand Lower Body Dressing Details (indicate cue type and reason): shoulder pain Toilet Transfer: Contact guard assist;Minimal assistance;Regular Toilet;Ambulation                  Extremity/Trunk Assessment Upper Extremity Assessment RUE Deficits / Details: continued painful ROM to shoulder, significant bruising to top of the shoulder RUE: Shoulder pain with ROM RUE Sensation: WNL RUE Coordination: WNL   Lower Extremity Assessment Lower Extremity Assessment: Defer to PT evaluation   Cervical / Trunk Assessment Cervical / Trunk Assessment: Kyphotic    Vision Baseline Vision/History: 1 Wears glasses Patient Visual Report: No change from baseline     Perception Perception Perception: Within Functional Limits   Praxis Praxis Praxis: WFL    Cognition Arousal: Alert Behavior During Therapy: WFL for tasks assessed/performed Overall Cognitive Status: Within Functional Limits for tasks assessed                                          Exercises      Shoulder Instructions       General Comments HR up to 140 during ambulation    Pertinent Vitals/ Pain  Pain Assessment Pain Assessment: Faces Faces Pain Scale: Hurts even more Pain Location: R shoulder > than R hip Pain Descriptors / Indicators: Tender, Discomfort, Grimacing, Guarding Pain Intervention(s): Monitored during session, Ice applied, Other (comment) (asked MD for shoulder x-ray)                                                          Frequency  Min 1X/week        Progress Toward Goals  OT Goals(current goals can now be found in the care plan section)  Progress towards OT goals: Progressing toward goals  Acute Rehab OT Goals OT Goal Formulation: With patient Time For Goal Achievement: 03/19/23 Potential to Achieve Goals: Good  Plan      Co-evaluation                 AM-PAC OT "6 Clicks" Daily Activity      Outcome Measure   Help from another person eating meals?: None Help from another person taking care of personal grooming?: A Little Help from another person toileting, which includes using toliet, bedpan, or urinal?: A Little Help from another person bathing (including washing, rinsing, drying)?: A Lot Help from another person to put on and taking off regular upper body clothing?: A Lot Help from another person to put on and taking off regular lower body clothing?: A Lot 6 Click Score: 16    End of Session    OT Visit Diagnosis: Unsteadiness on feet (R26.81);History of falling (Z91.81);Pain Pain - Right/Left: Right Pain - part of body: Shoulder;Hip   Activity Tolerance Patient limited by pain   Patient Left in bed;with call bell/phone within reach;with family/visitor present   Nurse Communication Mobility status        Time: 4098-1191 OT Time Calculation (min): 15 min  Charges: OT General Charges $OT Visit: 1 Visit OT Treatments $Self Care/Home Management : 8-22 mins  03/07/2023  RP, OTR/L  Acute Rehabilitation Services  Office:  [phone]   [name] 03/07/2023, 3:29 PM

## 2023-03-07 NOTE — Progress Notes (Addendum)
Physical Therapy Treatment Patient Details Name: MELODY SAVIDGE MRN: [id number] DOB: 11-May-1945 Today's Date: 03/07/2023   History of Present Illness 77 year old male with history of chronic systolic/diastolic heart failure status post ICD, paroxysmal atrial fibrillation complicated by sick sinus syndrome status post pacemaker, is being admitted with SIRS criteria, generalized weakness.  He presented with complaint of single presyncopal episode resulting in a fall in which he hit his head.    PT Comments  Pt tolerated treatment well today. Pt able to progress ambulation in hallway with RW CGA however HR noted to climb to 140 during ambulation. No change in DC/DME recs at this time. PT will continue to follow however pt and family anticipate DC home today.   If plan is discharge home, recommend the following: A little help with walking and/or transfers;A little help with bathing/dressing/bathroom;Assistance with cooking/housework;Assist for transportation;Help with stairs or ramp for entrance   Can travel by private vehicle        Equipment Recommendations  None recommended by PT    Recommendations for Other Services       Precautions / Restrictions Precautions Precautions: Fall Precaution Comments: R shoulder and R hip discomfort post fall.  Xrays have been (-) for fx. Restrictions Weight Bearing Restrictions: No     Mobility  Bed Mobility Overal bed mobility: Needs Assistance Bed Mobility: Supine to Sit, Sit to Supine     Supine to sit: Min assist Sit to supine: Contact guard assist   General bed mobility comments: Pt requested assistance with trunk elevation sitting EOB. CGA getting back to bed.    Transfers Overall transfer level: Needs assistance Equipment used: 1 person hand held assist Transfers: Sit to/from Stand Sit to Stand: Contact guard assist, From elevated surface           General transfer comment: CGA from elevated bed. Cues for hand placement.     Ambulation/Gait Ambulation/Gait assistance: Contact guard assist, Min assist Gait Distance (Feet): 75 Feet Assistive device: 1 person hand held assist, Rolling walker (2 wheels) Gait Pattern/deviations: Decreased stride length, Step-through pattern, Wide base of support, Trunk flexed Gait velocity: decreased     General Gait Details: no knee buckling noted today. Pt appeared fatigued however kept stating that he was fine. HR up to 140 with ambulation.   Stairs             Wheelchair Mobility     Tilt Bed    Modified Rankin (Stroke Patients Only)       Balance Overall balance assessment: Needs assistance Sitting-balance support: Feet supported Sitting balance-Leahy Scale: Fair     Standing balance support: Single extremity supported, Bilateral upper extremity supported Standing balance-Leahy Scale: Fair Standing balance comment: Reliant on external support                            Cognition Arousal: Alert Behavior During Therapy: WFL for tasks assessed/performed Overall Cognitive Status: Within Functional Limits for tasks assessed                                          Exercises      General Comments General comments (skin integrity, edema, etc.): HR up to 140 during ambulation      Pertinent Vitals/Pain Pain Assessment Pain Assessment: Faces Faces Pain Scale: Hurts little more Pain Location: R shoulder > than  R hip Pain Descriptors / Indicators: Tender, Discomfort, Grimacing, Guarding Pain Intervention(s): Monitored during session    Home Living                          Prior Function            PT Goals (current goals can now be found in the care plan section) Acute Rehab PT Goals Patient Stated Goal: to get better PT Goal Formulation: With patient Time For Goal Achievement: 03/20/23 Potential to Achieve Goals: Good    Frequency    Min 1X/week      PT Plan      Co-evaluation               AM-PAC PT "6 Clicks" Mobility   Outcome Measure  Help needed turning from your back to your side while in a flat bed without using bedrails?: A Little Help needed moving from lying on your back to sitting on the side of a flat bed without using bedrails?: A Little Help needed moving to and from a bed to a chair (including a wheelchair)?: A Little Help needed standing up from a chair using your arms (e.g., wheelchair or bedside chair)?: A Little Help needed to walk in hospital room?: A Little Help needed climbing 3-5 steps with a railing? : A Lot 6 Click Score: 17    End of Session Equipment Utilized During Treatment: Gait belt Activity Tolerance: Patient tolerated treatment well Patient left: in bed;with call bell/phone within reach;with family/visitor present;with bed alarm set Nurse Communication: Mobility status PT Visit Diagnosis: Unsteadiness on feet (R26.81);Repeated falls (R29.6);Other abnormalities of gait and mobility (R26.89)     Time: 1610-9604 PT Time Calculation (min) (ACUTE ONLY): 28 min  Charges:    $Gait Training: 8-22 mins $Therapeutic Activity: 8-22 mins PT General Charges $$ ACUTE PT VISIT: 1 Visit                     Shela Nevin, PT, DPT Acute Rehab Services [phone]    Gladys Damme 03/07/2023, 12:18 PM

## 2023-03-07 NOTE — Anesthesia Procedure Notes (Signed)
Procedure Name: MAC Date/Time: 03/07/2023 1:03 PM  Performed by: Waynard Edwards, CRNAPre-anesthesia Checklist: Patient identified, Emergency Drugs available, Suction available and Patient being monitored Patient Re-evaluated:Patient Re-evaluated prior to induction Oxygen Delivery Method: Nasal cannula Induction Type: IV induction Airway Equipment and Method: Bite block Placement Confirmation: positive ETCO2 Dental Injury: Teeth and Oropharynx as per pre-operative assessment

## 2023-03-07 NOTE — Interval H&P Note (Signed)
History and Physical Interval Note:  03/07/2023 12:06 PM  [name] [name]  has presented today for surgery, with the diagnosis of aflutter.  The various methods of treatment have been discussed with the patient and family. After consideration of risks, benefits and other options for treatment, the patient has consented to  Procedure(s): TRANSESOPHAGEAL ECHOCARDIOGRAM (N/A) CARDIOVERSION (CATH LAB) (N/A) as a surgical intervention.  The patient's history has been reviewed, patient examined, no change in status, stable for surgery.  I have reviewed the patient's chart and labs.  Questions were answered to the patient's satisfaction.    Informed Consent   Shared Decision Making/Informed Consent The risks (stroke, cardiac arrhythmias rarely resulting in the need for a temporary or permanent pacemaker, skin irritation or burns and complications associated with conscious sedation including aspiration, arrhythmia, respiratory failure and death), benefits (restoration of normal sinus rhythm) and alternatives of a direct current cardioversion were explained in detail to Mr. Dues and he agrees to proceed.     The risks [esophageal damage, perforation (1:10,000 risk), bleeding, pharyngeal hematoma as well as other potential complications associated with conscious sedation including aspiration, arrhythmia, respiratory failure and death], benefits (treatment guidance and diagnostic support) and alternatives of a transesophageal echocardiogram were discussed in detail with Mr. Rominger and he is willing to proceed.      Tessa Lerner, DO, [facility]  [facility]  [address] [place], [place] [address] 12:07 PM 03/07/23

## 2023-03-07 NOTE — Progress Notes (Signed)
Post DCCV cardioversion reviewed with Dr. Jimmey Ralph.    Plan: -reduce amiodarone to 200mg  daily -assess EKG in AM to review QTc    [name] Brim, MSN, APRN, NP-C, AGACNP-BC [facility] - Electrophysiology  03/07/2023, 3:18 PM

## 2023-03-07 NOTE — CV Procedure (Signed)
   TRANSESOPHAGEAL ECHOCARDIOGRAM GUIDED DIRECT CURRENT CARDIOVERSION  NAME:  [name] [name]    MRN: [id number] DOB:  February 04, 1946    ADMIT DATE: 03/04/2023  INDICATIONS: Symptomatic atrial flutter  PROCEDURE:   Informed consent was obtained prior to the procedure. The risks, benefits and alternatives for the procedure were discussed and the patient comprehended these risks.  Risks include, but are not limited to, cough, sore throat, vomiting, nausea, somnolence, esophageal and stomach trauma or perforation, bleeding, low blood pressure, aspiration, pneumonia, infection, trauma to the teeth and death.    After a procedural time-out, the oropharynx was anesthetized and the patient was sedated by the anesthesia service. The transesophageal probe was inserted in the esophagus and stomach without difficulty and multiple views were obtained. Anesthesia was monitored by Dr. Maple Hudson.   COMPLICATIONS:    Complications: No complications Patient tolerated procedure well.  KEY FINDINGS:  Severely reduced LVEF. No obvious LV thrombus or LAA thrombus.  Full Report to follow.   CARDIOVERSION:     Indications:  Symptomatic Atrial Flutter  Procedure Details:  Once the TEE was complete, the patient had the defibrillator pads placed in the anterior and posterior position. Once an appropriate level of sedation was confirmed, the patient was cardioverted x 1 with 150 J of biphasic synchronized energy.  The patient converted to NSR.  There were no apparent complications.  The patient had normal neuro status and respiratory status post procedure with vitals stable as recorded elsewhere.  Adequate airway was maintained throughout and vital signs monitored per protocol.  The ICD was re-evaluated by the industry rep post-procedure.   Post cardioversion QT prolonged  - will need to re-evaluate his medications and monitor for now.   Tessa Lerner, DO, [facility]  [address]  [address] [place], [place] [address] Pager: [phone] Office: [phone] 03/07/23 1:53 PM

## 2023-03-07 NOTE — Progress Notes (Signed)
  Patient Name: [name] [name] Date of Encounter: 03/07/2023  Primary Cardiologist: Arvilla Meres, MD Electrophysiologist: Sherryl Manges, MD  Interval Summary   The patient is doing well today.  At this time, the patient denies chest pain, shortness of breath, or any new concerns.  Vital Signs    Vitals:   03/06/23 2000 03/06/23 2357 03/07/23 0400 03/07/23 0756  BP: 111/76 122/76 122/73 117/75  Pulse: 95 99 96 (!) 104  Resp: (!) 22 18 19  (!) 22  Temp: 98.5 F (36.9 C) 97.9 F (36.6 C) 99.3 F (37.4 C) 98.5 F (36.9 C)  TempSrc: Axillary Axillary Axillary Oral  SpO2:  94% 92% 92%  Weight:      Height:       No intake or output data in the 24 hours ending 03/07/23 1003 Filed Weights   03/04/23 2124  Weight: 91.2 kg    Physical Exam    GEN- The patient is well appearing, alert and oriented x 3 today.   Lungs- Clear to ausculation bilaterally, normal work of breathing Cardiac- Regular rate and rhythm, no murmurs, rubs or gallops GI- soft, NT, ND, + BS Extremities- no clubbing or cyanosis. No edema  Telemetry    AFL 100-110's, occ PVC (personally reviewed)  Hospital Course    [name] [name] is a 77 y.o. male with a history of chronic HFrEF s/p ICD, paroxysmal atrial fibrillation/flutter, CAD s/p CABG 2018, LV thrombus, PAD, CKD 3B, DM2 admitted 11/4 with concern for for syncopal episode in setting of possible SIRS/sepsis. Hx CCM placement in 12/2022 (interruption of OAC at that time for procedure)  Assessment & Plan    Atrial Flutter  Chronic Systolic CHF s/p ICD & CCM  CAD s/p CABG  Hx LV Thrombus   Plan: -pending TEE to r/o thrombus, then DCCV  -continue eliquis  -continue amiodarone 200mg  BID  -tele monitoring      Informed Consent   Shared Decision Making/Informed Consent The risks [stroke, cardiac arrhythmias rarely resulting in the need for a temporary or permanent pacemaker, skin irritation or burns, esophageal damage, perforation (1:10,000  risk), bleeding, pharyngeal hematoma as well as other potential complications associated with conscious sedation including aspiration, arrhythmia, respiratory failure and death], benefits (treatment guidance, restoration of normal sinus rhythm, diagnostic support) and alternatives of a transesophageal echocardiogram guided cardioversion were discussed in detail with Mr. Luecke and he is willing to proceed.      For questions or updates, please contact [facility] Please consult [email or web address] for contact info under Cardiology/STEMI.  Signed, Canary Brim, MSN, APRN, NP-C, AGACNP-BC [facility] - Electrophysiology  03/07/2023, 10:06 AM

## 2023-03-07 NOTE — Plan of Care (Signed)
  Problem: Education: Goal: Knowledge of General Education information will improve Description: Including pain rating scale, medication(s)/side effects and non-pharmacologic comfort measures Outcome: Progressing   Problem: Health Behavior/Discharge Planning: Goal: Ability to manage health-related needs will improve Outcome: Progressing   Problem: Clinical Measurements: Goal: Ability to maintain clinical measurements within normal limits will improve Outcome: Progressing   Problem: Activity: Goal: Risk for activity intolerance will decrease Outcome: Progressing   Problem: Coping: Goal: Level of anxiety will decrease Outcome: Progressing   Problem: Elimination: Goal: Will not experience complications related to bowel motility Outcome: Progressing   

## 2023-03-07 NOTE — Transfer of Care (Signed)
Immediate Anesthesia Transfer of Care Note  Patient: CHIEF WALKUP  Procedure(s) Performed: TRANSESOPHAGEAL ECHOCARDIOGRAM CARDIOVERSION (CATH LAB)  Patient Location: PACU  Anesthesia Type:General  Level of Consciousness: drowsy  Airway & Oxygen Therapy: Patient Spontanous Breathing and Patient connected to nasal cannula oxygen  Post-op Assessment: Report given to RN and Post -op Vital signs reviewed and stable  Post vital signs: Reviewed and stable  Last Vitals:  Vitals Value Taken Time  BP 93/59 03/07/23 1346  Temp 36.4 C 03/07/23 1346  Pulse 72 03/07/23 1347  Resp 24 03/07/23 1347  SpO2 89 % 03/07/23 1347  Vitals shown include unfiled device data.  Last Pain:  Vitals:   03/07/23 0900  TempSrc:   PainSc: 0-No pain         Complications: No notable events documented.

## 2023-03-07 NOTE — Anesthesia Preprocedure Evaluation (Signed)
Anesthesia Evaluation  Patient identified by MRN, date of birth, ID band Patient awake    Reviewed: Allergy & Precautions, NPO status , Patient's Chart, lab work & pertinent test results  History of Anesthesia Complications Negative for: history of anesthetic complications  Airway Mallampati: II  TM Distance: >3 FB Neck ROM: Full    Dental  (+) Teeth Intact, Dental Advisory Given   Pulmonary shortness of breath, neg sleep apnea, neg COPD, neg recent URI, former smoker    + decreased breath sounds      Cardiovascular hypertension, Pt. on medications + CAD, + Past MI, + CABG, + Peripheral Vascular Disease and +CHF  + Cardiac Defibrillator  Rhythm:Irregular     Neuro/Psych negative neurological ROS  negative psych ROS   GI/Hepatic negative GI ROS,,,  Endo/Other  diabetes    Renal/GU CRFRenal diseaseLab Results      Component                Value               Date                      NA                       131 (L)             03/07/2023                K                        4.3                 03/07/2023                CO2                      19 (L)              03/07/2023                GLUCOSE                  149 (H)             03/07/2023                BUN                      37 (H)              03/07/2023                CREATININE               2.14 (H)            03/07/2023                CALCIUM                  8.2 (L)             03/07/2023                EGFR                     29 (L)              11/06/2022  GFRNONAA                 31 (L)              03/07/2023                Musculoskeletal  (+) Arthritis ,    Abdominal   Peds  Hematology  (+) Blood dyscrasia, anemia Lab Results      Component                Value               Date                      WBC                      14.9 (H)            03/07/2023                HGB                      11.4 (L)            03/07/2023                 HCT                      34.7 (L)            03/07/2023                MCV                      84.4                03/07/2023                PLT                      369                 03/07/2023              Anesthesia Other Findings  1. Global hypokinesis with apical akinesis. Left ventricular ejection  fraction, by estimation, is 25 to 30%. The left ventricle has severely  decreased function. The left ventricle demonstrates global hypokinesis.  The left ventricular internal cavity  size was mildly dilated. Left ventricular diastolic parameters are  consistent with Grade II diastolic dysfunction (pseudonormalization).   2. Right ventricular systolic function is mildly reduced. The right  ventricular size is normal. There is mildly elevated pulmonary artery  systolic pressure.   3. Left atrial size was mildly dilated.   4. The mitral valve is normal in structure. Trivial mitral valve  regurgitation. No evidence of mitral stenosis.   5. Left and right coronary cusps are partially fused. The aortic valve is  tricuspid. There is mild calcification of the aortic valve. There is mild  thickening of the aortic valve. Aortic valve regurgitation is trivial. No  aortic stenosis is present.   6. The inferior vena cava is normal in size with greater than 50%  respiratory variability, suggesting right atrial pressure of 3 mmHg.    Reproductive/Obstetrics  Anesthesia Physical Anesthesia Plan  ASA: 3  Anesthesia Plan: MAC and General   Post-op Pain Management: Minimal or no pain anticipated   Induction: Intravenous  PONV Risk Score and Plan: 2 and Propofol infusion and Treatment may vary due to age or medical condition  Airway Management Planned: Nasal Cannula, Natural Airway and Simple Face Mask  Additional Equipment: None  Intra-op Plan:   Post-operative Plan:   Informed Consent: I have reviewed the patients  History and Physical, chart, labs and discussed the procedure including the risks, benefits and alternatives for the proposed anesthesia with the patient or authorized representative who has indicated his/her understanding and acceptance.     Dental advisory given  Plan Discussed with: CRNA  Anesthesia Plan Comments:          Anesthesia Quick Evaluation

## 2023-03-07 NOTE — Progress Notes (Signed)
*  PRELIMINARY RESULTS* Echocardiogram Echocardiogram Transesophageal has been performed.  [name] [name] 03/07/2023, 1:37 PM

## 2023-03-07 NOTE — TOC Initial Note (Signed)
Transition of Care [facility]) - Initial/Assessment Note    Patient Details  Name: [name] [name] MRN: [id number] Date of Birth: [birth year]  [facility] of [facility]) CM/SW Contact:    Lawerance Sabal, RN Phone Number: 03/07/2023, 1:48 PM  Clinical Narrative:                  Sherron Monday w patient's wife over the phone. They are agreeable to home health services, they have no preference for home health agency, and [facility] was able to accept for care.  Wife declines DME needs.   NEEDS HH Orders    Expected Discharge Plan: Home w Home Health Services Barriers to Discharge: Continued Medical Work up   Patient Goals and CMS Choice Patient states their goals for this hospitalization and ongoing recovery are:: to go home CMS Medicare.gov Compare Post Acute Care list provided to:: Other (Comment Required) Choice offered to / list presented to : Spouse      Expected Discharge Plan and Services     Post Acute Care Choice: Home Health Living arrangements for the past 2 months: Single Family Home                                      Prior Living Arrangements/Services Living arrangements for the past 2 months: Single Family Home Lives with:: Spouse                   Activities of Daily Living   ADL Screening (condition at time of admission) Independently performs ADLs?: Yes (appropriate for developmental age) Is the patient deaf or have difficulty hearing?: No Does the patient have difficulty seeing, even when wearing glasses/contacts?: No Does the patient have difficulty concentrating, remembering, or making decisions?: No  Permission Sought/Granted                  Emotional Assessment              Admission diagnosis:  Dehydration [E86.0] SIRS (systemic inflammatory response syndrome) (HCC) [R65.10] Injury of head, initial encounter [S09.90XA] Syncope, unspecified syncope type [R55] Leukocytosis, unspecified type [D72.829] Patient Active Problem List    Diagnosis Date Noted   SIRS (systemic inflammatory response syndrome) (HCC) 03/05/2023   Fall at home, initial encounter 03/05/2023   Syncope 03/05/2023   Transient hypotension 03/05/2023   Acute kidney injury superimposed on chronic kidney disease (HCC) 03/05/2023   Heart failure with reduced ejection fraction (HCC) 03/05/2023   Thyroid nodule 03/05/2023   Acute on chronic combined systolic and diastolic CHF (congestive heart failure) (HCC) 08/02/2021   Paroxysmal A-fib/flutter 08/02/2021   Cholelithiasis with choledocholithiasis 08/19/2020   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   Ischemic cardiomyopathy 11/20/2017   Elevated troponin and patient with history of CAD/CABG 05/10/2017   Chronic combined systolic and diastolic CHF (congestive heart failure) (HCC) 05/10/2017   Chronic kidney disease, stage 3b (HCC) 05/10/2017   Acute epididymitis 05/10/2017   Left epididymitis    Orchitis, right    S/P CABG x 2 04/08/2017   ST elevation myocardial infarction (STEMI) of anterior wall (HCC) 03/31/2017   STEMI (ST elevation myocardial infarction) (HCC) 03/31/2017   Essential hypertension    Diabetes mellitus type 2, controlled (HCC)    PCP:  [name], MD Pharmacy:   [phone] [place] [place] - [address] AT [address]  [address] [place] [address] Phone: [phone] Fax: [phone]  [facility] [place], [place] - [facility] AT Portal to Registered [address] [place] [address] Phone: [phone] Fax: [phone]  [facility] [address] [place] [address] Phone: [phone] Fax: [phone]     Social Determinants of Health (SDOH) Social History: SDOH Screenings   Food Insecurity: No Food Insecurity  (03/05/2023)  Housing: Low Risk  (03/05/2023)  Transportation Needs: No Transportation Needs (03/05/2023)  Utilities: Not At Risk (03/05/2023)  Physical Activity: Inactive (07/11/2017)  Stress: No Stress Concern Present (07/11/2017)  Tobacco Use: Medium Risk (03/07/2023)   SDOH Interventions:     Readmission Risk Interventions     No data to display

## 2023-03-08 ENCOUNTER — Encounter (HOSPITAL_COMMUNITY): Payer: Self-pay | Admitting: Cardiology

## 2023-03-08 DIAGNOSIS — I5020 Unspecified systolic (congestive) heart failure: Secondary | ICD-10-CM | POA: Diagnosis not present

## 2023-03-08 DIAGNOSIS — I4892 Unspecified atrial flutter: Secondary | ICD-10-CM | POA: Diagnosis not present

## 2023-03-08 DIAGNOSIS — N179 Acute kidney failure, unspecified: Secondary | ICD-10-CM | POA: Diagnosis not present

## 2023-03-08 DIAGNOSIS — R6510 Systemic inflammatory response syndrome (SIRS) of non-infectious origin without acute organ dysfunction: Secondary | ICD-10-CM | POA: Diagnosis not present

## 2023-03-08 LAB — BASIC METABOLIC PANEL
Anion gap: 6 (ref 5–15)
BUN: 43 mg/dL — ABNORMAL HIGH (ref 8–23)
CO2: 22 mmol/L (ref 22–32)
Calcium: 7.9 mg/dL — ABNORMAL LOW (ref 8.9–10.3)
Chloride: 103 mmol/L (ref 98–111)
Creatinine, Ser: 2.36 mg/dL — ABNORMAL HIGH (ref 0.61–1.24)
GFR, Estimated: 28 mL/min — ABNORMAL LOW (ref >60)
Glucose, Bld: 151 mg/dL — ABNORMAL HIGH (ref 70–99)
Potassium: 4.3 mmol/L (ref 3.5–5.1)
Sodium: 131 mmol/L — ABNORMAL LOW (ref 135–145)

## 2023-03-08 LAB — GLUCOSE, CAPILLARY: Glucose-Capillary: 149 mg/dL — ABNORMAL HIGH (ref 70–99)

## 2023-03-08 LAB — CBC
HCT: 33.1 % — ABNORMAL LOW (ref 39.0–52.0)
Hemoglobin: 10.8 g/dL — ABNORMAL LOW (ref 13.0–17.0)
MCH: 27.3 pg (ref 26.0–34.0)
MCHC: 32.6 g/dL (ref 30.0–36.0)
MCV: 83.6 fL (ref 80.0–100.0)
Platelets: 393 10*3/uL (ref 150–400)
RBC: 3.96 MIL/uL — ABNORMAL LOW (ref 4.22–5.81)
RDW: 16.1 % — ABNORMAL HIGH (ref 11.5–15.5)
WBC: 14.1 10*3/uL — ABNORMAL HIGH (ref 4.0–10.5)
nRBC: 0 % (ref 0.0–0.2)

## 2023-03-08 LAB — MAGNESIUM: Magnesium: 2.4 mg/dL (ref 1.7–2.4)

## 2023-03-08 MED ORDER — AMIODARONE HCL 200 MG PO TABS: 200.0000 mg | ORAL_TABLET | Freq: Every day | ORAL | Status: DC

## 2023-03-08 NOTE — Care Management Important Message (Signed)
Important Message  Patient Details  Name: [name] [name] MRN: [id number] Date of Birth: Apr 13, 1946   Important Message Given:  Yes - Medicare IM  Patient left prior to IM delivery will mail a copy to the patient home address.    Ylonda Storr 03/08/2023, 1:27 PM

## 2023-03-08 NOTE — Progress Notes (Signed)
Heart Failure Navigator Progress Note  Assessed for Heart & Vascular TOC clinic readiness.  Patient is already established with the advanced heart failure team. Patient of Dr. Gala Romney.   Sharen Hones, PharmD, BCPS Heart Failure Stewardship Pharmacist Phone [phone]

## 2023-03-08 NOTE — Discharge Summary (Addendum)
PATIENT DETAILS Name: [name] [name] Age: 77 y.o. Sex: male Date of Birth: [birth year] MRN: [id number]. Admitting Physician: Angie Fava, DO FAO:[id number], [name], MD  Admit Date: 03/04/2023 Discharge date: 03/08/2023  Recommendations for Outpatient Follow-up:  Follow up with PCP in 1-2 weeks Please obtain CMP/CBC in one week Please ensure follow-up with cardiology and orthopedics.   Incidental finding-thyroid nodule-needs FNA/workup if not done in the past  Admitted From:  Home  Disposition: Home health   Discharge Condition: good  CODE STATUS:   Code Status: Full Code   Diet recommendation:  Diet Order             Diet - low sodium heart healthy           Diet Carb Modified           Diet heart healthy/carb modified Room service appropriate? Yes; Fluid consistency: Thin  Diet effective now                    Brief Summary: Patient is a 77 y.o.  male with history of chronic HFrEF-s/p ICD, CAD s/p CABG 2018, persistent atrial fibrillation on Eliquis, LV thrombus, DM-2, CKD stage IIIb, PAD presented with several days history of subjective fever/malaise/weakness "felt like I had the flu"-and sustained a syncopal episode on the day of admission-subsequently brought to the ED and admitted to the hospitalist service.  See below for further details.   Significant events: 11/4>> admit to [facility]   Significant studies: 11/4>> CT head: No acute intracranial abnormality 11/4>> CT C-spine: No fracture 11/4>> CXR: No PNA 11/5>> x-ray hip right: No fracture   Significant microbiology data: 11/4>> blood cultures: No growth 11/5>> COVID PCR: Negative 11/5>> respiratory virus panel: Negative   Procedures: 11/7>> TEE guided cardioversion-restoration of sinus rhythm   Consults: EP  Brief Hospital Course: SIRS/leukocytosis ?  Viral etiology All cultures/imaging studies negative-no evidence of infection by physical exam Remains stable-off all antibiotics since  11/6-leukocytosis slowly decreasing Continue to monitor closely in the outpatient setting.   Syncope Suspect this was secondary to orthostatic hypotension-probably from recent viral illness. Physical therapy did check orthostatic vital signs-he was already sitting-it was positive-this is after overnight IV fluid hydration. ICD interrogation completed by EP no evidence of arrhythmias Continue compression stockings.   Right distal clavicular fracture Secondary to fall/syncope Evaluated by orthopedics-nonoperative management with sling/nonweightbearing status and gentle range of motion recommended Follow-up with Dr. Susa Simmonds.   Chronic HFrEF-s/p AICD Volume status stable-resume Demadex-Entresto dose reduced-reassess on follow-up with PCP/cardiology-and optimize medications accordingly.    QTc prolongation Has ICD in place Amiodarone changed to 200 mg daily Per EP-no adjustment made to device-patient has follow-up with Dr. Graciela Husbands on 12/11-okay to discharge home today.   Persistent atrial fibrillation Rate controlled-but underwent cardioversion on 11/7 with restoration of sinus rhythm. Continue amiodarone (dosage reduced due to QTc prolongation)-continue Eliquis.  CAD s/p CABG No anginal symptoms Not on aspirin as on Eliquis Continue statin  HTN Blood pressure stable-has been resumed on Demadex/Entresto during this hospitalization Due to concern that syncope was from orthostatic hypotension-will need to allow some amount of permissive hypertension-hence amlodipine has been discontinued. Follow-up with PCP and optimize accordingly.   AKI on CKD stage IIIb AKI likely hemodynamically mediated AKI improved-creatinine back to baseline.    Hyponatremia Secondary to IV fluids IV fluids discontinued 11/6-fluid restriction started-sodium improved to 131 Has been restarted on diuretics-recheck electrolytes in 1 week at PCPs office.   DM-2 (A1c 6.9  on 11/5) CBG stable on SSI while  inpatient-appears to be diet controlled in the outpatient setting  19 mm right thyroid gland nodule Incidentally seen on CT C-spine Stable for outpatient follow-up by PCP   BMI: Estimated body mass index is 25.81 kg/m as calculated from the following:   Height as of this encounter: 6\' 2"  (1.88 m).   Weight as of this encounter: 91.2 kg.    Discharge Diagnoses:  Principal Problem:   SIRS (systemic inflammatory response syndrome) (HCC) Active Problems:   Transient hypotension   Fall at home, initial encounter   Syncope   Acute kidney injury superimposed on chronic kidney disease (HCC)   Paroxysmal A-fib/flutter   Heart failure with reduced ejection fraction (HCC)   ICD (implantable cardioverter-defibrillator) in place   Diabetes mellitus type 2, controlled (HCC)   Thyroid nodule   Discharge Instructions:  Activity:  As tolerated   Discharge Instructions     (HEART FAILURE PATIENTS) Call MD:  Anytime you have any of the following symptoms: 1) 3 pound weight gain in 24 hours or 5 pounds in 1 week 2) shortness of breath, with or without a dry hacking cough 3) swelling in the hands, feet or stomach 4) if you have to sleep on extra pillows at night in order to breathe.   Complete by: As directed    Diet - low sodium heart healthy   Complete by: As directed    Diet Carb Modified   Complete by: As directed    Discharge instructions   Complete by: As directed    Follow with Primary MD  [name], MD in 1-2 weeks  Incidental finding-you have a thyroid nodule-please talk to your primary care practitioner about need for further workup-in some cases-these nodules can turn cancerous.  Continue Demadex/Entresto-but we have held your amlodipine as your blood pressure remains soft-and you had orthostatic hypotension (drop in blood pressure when you stand up) when you first presented.  Please get a complete blood count and chemistry panel checked by your Primary MD at your next  visit, and again as instructed by your Primary MD.  Get Medicines reviewed and adjusted: Please take all your medications with you for your next visit with your Primary MD  Laboratory/radiological data: Please request your Primary MD to go over all hospital tests and procedure/radiological results at the follow up, please ask your Primary MD to get all Hospital records sent to his/her office.  In some cases, they will be blood work, cultures and biopsy results pending at the time of your discharge. Please request that your primary care M.D. follows up on these results.  Also Note the following: If you experience worsening of your admission symptoms, develop shortness of breath, life threatening emergency, suicidal or homicidal thoughts you must seek medical attention immediately by calling 911 or calling your MD immediately  if symptoms less severe.  You must read complete instructions/literature along with all the possible adverse reactions/side effects for all the Medicines you take and that have been prescribed to you. Take any new Medicines after you have completely understood and accpet all the possible adverse reactions/side effects.   Do not drive when taking Pain medications or sleeping medications (Benzodaizepines)  Do not take more than prescribed Pain, Sleep and Anxiety Medications. It is not advisable to combine anxiety,sleep and pain medications without talking with your primary care practitioner  Special Instructions: If you have smoked or chewed Tobacco  in the last 2 yrs please stop  smoking, stop any regular Alcohol  and or any Recreational drug use.  Wear Seat belts while driving.  Please note: You were cared for by a hospitalist during your hospital stay. Once you are discharged, your primary care physician will handle any further medical issues. Please note that NO REFILLS for any discharge medications will be authorized once you are discharged, as it is imperative that you  return to your primary care physician (or establish a relationship with a primary care physician if you do not have one) for your post hospital discharge needs so that they can reassess your need for medications and monitor your lab values.   Increase activity slowly   Complete by: As directed    Nonweightbearing status to right upper extremity-gentle range of motion to right shoulder.      Allergies as of 03/08/2023       Reactions   Spironolactone Other (See Comments)   Painful gynecomastia   Empagliflozin Rash   Rash at the groin Jardiance   Niacin And Related Hives        Medication List     STOP taking these medications    amLODipine 5 MG tablet Commonly known as: NORVASC       TAKE these medications    acetaminophen 500 MG tablet Commonly known as: TYLENOL Take 1,000 mg by mouth every 6 (six) hours as needed for mild pain (pain score 1-3), headache or fever.   amiodarone 200 MG tablet Commonly known as: PACERONE Take 1 tablet (200 mg total) by mouth daily. What changed: when to take this   atorvastatin 80 MG tablet Commonly known as: LIPITOR TAKE 1 TABLET BY MOUTH EVERY DAY   cyanocobalamin 250 MCG tablet Commonly known as: VITAMIN B12 Take 750 mcg by mouth daily.   Dristan 0.05 % nasal spray Generic drug: oxymetazoline Place 1 spray into both nostrils at bedtime.   Eliquis 5 MG Tabs tablet Generic drug: apixaban TAKE 1 TABLET TWICE A DAY   Entresto 49-51 MG Generic drug: sacubitril-valsartan Take 1 tablet by mouth 2 (two) times daily. What changed: Another medication with the same name was removed. Continue taking this medication, and follow the directions you see here.   torsemide 20 MG tablet Commonly known as: DEMADEX Take 1 tablet (20 mg total) by mouth daily.   zolpidem 10 MG tablet Commonly known as: AMBIEN Take 5 mg by mouth at bedtime.        Follow-up Information     [facility] Follow up.   Specialty: Home  Health Services Why: for home health services Contact information: [address] [place] [address] [phone]         [name], MD. Schedule an appointment as soon as possible for a visit in 1 week(s).   Specialty: Family Medicine Contact information: [address] [place] [address] [phone]         Terance Hart, MD. Schedule an appointment as soon as possible for a visit in 2 week(s).   Specialty: Orthopedic Surgery Contact information: [address] [place] [address] [phone]         Bensimhon, Bevelyn Buckles, MD. Schedule an appointment as soon as possible for a visit in 2 week(s).   Specialty: Cardiology Contact information: [address] [place] [address] [phone]                Allergies  Allergen Reactions   Spironolactone Other (See  Comments)    Painful gynecomastia   Empagliflozin Rash    Rash at the groin Jardiance   Niacin And Related Hives     Other Procedures/Studies: DG Shoulder Right  Result Date: 03/07/2023 CLINICAL DATA:  Pain after fall. EXAM: RIGHT SHOULDER - 2+ VIEW COMPARISON:  No prior shoulder exams. Chest radiograph 01/16/2023 reviewed. FINDINGS: Mild acromioclavicular widening, 9 mm in the superior aspect. Possible small fracture fragment arising from the distal clavicle, not seen previously. Right clavicle is elevated less than 1 shaft width with respect to the acromion. This was not definitively seen on prior chest radiograph. Glenohumeral alignment is preserved. Faint calcification adjacent to the lateral humeral head. There is a subacromial spur. IMPRESSION: 1. Mild acromioclavicular widening with possible small fracture fragment arising from the distal clavicle. Findings suggestive of AC joint injury. 2. Faint calcification adjacent to the lateral humeral head may represent calcific tendinitis. 3. Subacromial spur. Electronically Signed   By: Narda Rutherford M.D.   On: 03/07/2023 20:39   ECHO TEE  Result Date: 03/07/2023    TRANSESOPHOGEAL ECHO REPORT   Patient Name:   TAOS LANNEN Date of Exam: 03/07/2023 Medical Rec #:  [id number]        Height:       74.0 in Accession #:    [id number]       Weight:       201.0 lb Date of Birth:  [birth year]        BSA:          2.178 m Patient Age:    77 years         BP:           129/69 mmHg Patient Gender: M                HR:           93 bpm. Exam Location:  Inpatient Procedure: Transesophageal Echo, Cardiac Doppler and Color Doppler Indications:    AFIB  History:        Patient has prior history of Echocardiogram examinations.  Sonographer:    Dondra Prader RVT RCS Referring Phys: [id number] Jeanella Craze PROCEDURE: After discussion of the risks and benefits of a TEE, an informed consent was obtained from the patient. The transesophogeal probe was passed without difficulty through the esophogus of the patient. Imaged were obtained with the patient in a supine position. Sedation performed by different physician. Image quality was good. The patient's vital signs; including heart rate, blood pressure, and oxygen saturation; remained stable throughout the procedure. Supplementary images were obtained from transthoracic windows as indicated to answer the clinical question. The patient developed no complications during the procedure. A successful direct current cardioversion was performed at 150 joules with 1 attempt.  IMPRESSIONS  1. Left ventricular ejection fraction, by estimation, is 20 to 25%. The left ventricle has severely decreased function. The left ventricular internal cavity size was dilated.  2. Right ventricular systolic function is mildly reduced. The right ventricular size is not well visualized.  3. No left atrial/left atrial appendage thrombus was detected. The LAA emptying velocity was 36 cm/s.  4. The mitral valve was not well visualized.  5. The aortic valve is normal in structure. Aortic valve  regurgitation is trivial. Aortic valve sclerosis is present, with no evidence of aortic valve stenosis.  6. There is mild (Grade II) layered plaque involving the descending aorta.  7. The atrial septum is grossly normal by Color Doppler. Conclusion(s)/Recommendation(s):  No LA/LAA thrombus identified. Successful cardioversion performed with restoration of normal sinus rhythm. FINDINGS  Left Ventricle: The left ventricle demonstrates global hypokinesis and regional wall motion abnormalities.Left ventricular diastolic function could not be evaluated due to atrial flutter. Left ventricular ejection fraction, by estimation, is 20 to 25%. The left ventricle has severely decreased function. The left ventricular internal cavity size was dilated. Left ventricular diastolic function could not be evaluated due to atrial fibrillation. Right Ventricle: The right ventricular size is not well visualized. Right vetricular wall thickness was not assessed. Right ventricular systolic function is mildly reduced. Left Atrium: Left atrial size was not well visualized. No left atrial/left atrial appendage thrombus was detected. The LAA emptying velocity was 36 cm/s. Right Atrium: Right atrial size was not well visualized. Pericardium: There is no evidence of pericardial effusion. Mitral Valve: The mitral valve was not well visualized. Tricuspid Valve: The tricuspid valve is normal in structure. Tricuspid valve regurgitation is mild . No evidence of tricuspid stenosis. Aortic Valve: The aortic valve is normal in structure. Aortic valve regurgitation is trivial. Aortic valve sclerosis is present, with no evidence of aortic valve stenosis. Pulmonic Valve: The pulmonic valve was grossly normal. Pulmonic valve regurgitation is not visualized. No evidence of pulmonic stenosis. Aorta: Aortic root could not be assessed. There is mild (Grade II) layered plaque involving the descending aorta. Venous: The inferior vena cava was not well visualized.  IAS/Shunts: The atrial septum is grossly normal by Color Doppler. Additional Comments: A device lead is visualized. Spectral Doppler performed. [employer] Electronically signed by Tessa Lerner Signature Date/Time: 03/07/2023/3:43:35 PM    Final    EP STUDY  Result Date: 03/07/2023 See surgical note for result.  DG Hip Unilat W or Wo Pelvis 1 View Right  Result Date: 03/05/2023 CLINICAL DATA:  Multiple falls, right hip pain EXAM: DG HIP (WITH OR WITHOUT PELVIS) 1V RIGHT COMPARISON:  12/15/2018 FINDINGS: Mild symmetric degenerative changes in the hips with spurring. SI joints symmetric. No acute bony abnormality. Specifically, no fracture, subluxation, or dislocation. IMPRESSION: No acute bony abnormality. Electronically Signed   By: Charlett Nose M.D.   On: 03/05/2023 00:59   CT HEAD WO CONTRAST ( )  Result Date: 03/04/2023 CLINICAL DATA:  Head trauma, minor (Age >= 65y); Neck trauma (Age >= 65y), fall, chronic anticoagulation EXAM: CT HEAD WITHOUT CONTRAST CT CERVICAL SPINE WITHOUT CONTRAST TECHNIQUE: Multidetector CT imaging of the head and cervical spine was performed following the standard protocol without intravenous contrast. Multiplanar CT image reconstructions of the cervical spine were also generated. RADIATION DOSE REDUCTION: This exam was performed according to the departmental dose-optimization program which includes automated exposure control, adjustment of the mA and/or kV according to patient size and/or use of iterative reconstruction technique. COMPARISON:  None Available. FINDINGS: CT HEAD FINDINGS Brain: No evidence of acute infarction, hemorrhage, hydrocephalus, extra-axial collection or mass lesion/mass effect. Vascular: No hyperdense vessel or unexpected calcification. Skull: Normal. Negative for fracture or focal lesion. Sinuses/Orbits: No acute finding. Other: Mastoid air cells and middle ear cavities are clear. Small right parietal scalp hematoma. CT CERVICAL SPINE FINDINGS  Alignment: Normal. Skull base and vertebrae: Craniocervical alignment is normal. The atlantodental interval is not widened. No acute fracture of the cervical spine. Vertebral body height is preserved. Soft tissues and spinal canal: No prevertebral fluid or swelling. No visible canal hematoma. Disc levels: There is endplate remodeling throughout the cervical spine in keeping with changes of diffuse moderate degenerative disc disease. Prevertebral soft tissues are not thickened  on sagittal reformats. The spinal canal is widely patent. No significant neuroforaminal narrowing. Upper chest: 19 mm nodule is seen within the right thyroid gland demonstrating punctate internal calcification and heterogeneous internal enhancement. This is not well characterized on this examination. Visualized lung apices demonstrate at least mild emphysematous. Other: None IMPRESSION: 1. No acute intracranial abnormality. No calvarial fracture. Small right parietal scalp hematoma. 2. No acute fracture or listhesis of the cervical spine. 3. 19 mm nodule within the right thyroid gland, not well characterized on this examination. Recommend thyroid US (ref: J Am Coll Radiol. 2015 Feb;12(2): 143-50). Emphysema (ICD10-J43.9). Electronically Signed   By: Helyn Numbers M.D.   On: 03/04/2023 21:45   CT Cervical Spine Wo Contrast  Result Date: 03/04/2023 CLINICAL DATA:  Head trauma, minor (Age >= 65y); Neck trauma (Age >= 65y), fall, chronic anticoagulation EXAM: CT HEAD WITHOUT CONTRAST CT CERVICAL SPINE WITHOUT CONTRAST TECHNIQUE: Multidetector CT imaging of the head and cervical spine was performed following the standard protocol without intravenous contrast. Multiplanar CT image reconstructions of the cervical spine were also generated. RADIATION DOSE REDUCTION: This exam was performed according to the departmental dose-optimization program which includes automated exposure control, adjustment of the mA and/or kV according to patient size  and/or use of iterative reconstruction technique. COMPARISON:  None Available. FINDINGS: CT HEAD FINDINGS Brain: No evidence of acute infarction, hemorrhage, hydrocephalus, extra-axial collection or mass lesion/mass effect. Vascular: No hyperdense vessel or unexpected calcification. Skull: Normal. Negative for fracture or focal lesion. Sinuses/Orbits: No acute finding. Other: Mastoid air cells and middle ear cavities are clear. Small right parietal scalp hematoma. CT CERVICAL SPINE FINDINGS Alignment: Normal. Skull base and vertebrae: Craniocervical alignment is normal. The atlantodental interval is not widened. No acute fracture of the cervical spine. Vertebral body height is preserved. Soft tissues and spinal canal: No prevertebral fluid or swelling. No visible canal hematoma. Disc levels: There is endplate remodeling throughout the cervical spine in keeping with changes of diffuse moderate degenerative disc disease. Prevertebral soft tissues are not thickened on sagittal reformats. The spinal canal is widely patent. No significant neuroforaminal narrowing. Upper chest: 19 mm nodule is seen within the right thyroid gland demonstrating punctate internal calcification and heterogeneous internal enhancement. This is not well characterized on this examination. Visualized lung apices demonstrate at least mild emphysematous. Other: None IMPRESSION: 1. No acute intracranial abnormality. No calvarial fracture. Small right parietal scalp hematoma. 2. No acute fracture or listhesis of the cervical spine. 3. 19 mm nodule within the right thyroid gland, not well characterized on this examination. Recommend thyroid US (ref: J Am Coll Radiol. 2015 Feb;12(2): 143-50). Emphysema (ICD10-J43.9). Electronically Signed   By: Helyn Numbers M.D.   On: 03/04/2023 21:45   DG Chest Portable 1 View  Result Date: 03/04/2023 CLINICAL DATA:  Syncope, fall. EXAM: PORTABLE CHEST 1 VIEW COMPARISON:  01/16/2023. FINDINGS: The heart is  enlarged and the mediastinal contour is within normal limits. There is atherosclerotic calcification of the aorta. Interstitial prominence is present bilaterally and likely chronic. No consolidation, effusion, or pneumothorax. Multi lead pacemaker devices are present bilaterally. Sternotomy wires are noted. No acute osseous abnormality is seen. IMPRESSION: 1. No active disease. 2. Cardiomegaly. 3. Chronic interstitial changes bilaterally. Electronically Signed   By: Thornell Sartorius M.D.   On: 03/04/2023 21:44     TODAY-DAY OF DISCHARGE:  Subjective:   [name] [name] today has no headache,no chest abdominal pain,no new weakness tingling or numbness, feels much better wants to go home today.  Objective:   Blood pressure 133/67, pulse 75, temperature 98.3 F (36.8 C), temperature source Oral, resp. rate 20, height 6\' 2"  (1.88 m), weight 91.2 kg, SpO2 90%.  Intake/Output Summary (Last 24 hours) at 03/08/2023 1100 Last data filed at 03/08/2023 0600 Gross per 24 hour  Intake --  Output 675 ml  Net -675 ml   Filed Weights   03/04/23 2124  Weight: 91.2 kg    Exam: Awake Alert, Oriented *3, No new F.N deficits, Normal affect [place].AT,PERRAL Supple Neck,No JVD, No cervical lymphadenopathy appriciated.  Symmetrical Chest wall movement, Good air movement bilaterally, CTAB RRR,No Gallops,Rubs or new Murmurs, No Parasternal Heave +ve B.Sounds, Abd Soft, Non tender, No organomegaly appriciated, No rebound -guarding or rigidity. No Cyanosis, Clubbing or edema, No new Rash or bruise   PERTINENT RADIOLOGIC STUDIES: DG Shoulder Right  Result Date: 03/07/2023 CLINICAL DATA:  Pain after fall. EXAM: RIGHT SHOULDER - 2+ VIEW COMPARISON:  No prior shoulder exams. Chest radiograph 01/16/2023 reviewed. FINDINGS: Mild acromioclavicular widening, 9 mm in the superior aspect. Possible small fracture fragment arising from the distal clavicle, not seen previously. Right clavicle is elevated less than 1 shaft width  with respect to the acromion. This was not definitively seen on prior chest radiograph. Glenohumeral alignment is preserved. Faint calcification adjacent to the lateral humeral head. There is a subacromial spur. IMPRESSION: 1. Mild acromioclavicular widening with possible small fracture fragment arising from the distal clavicle. Findings suggestive of AC joint injury. 2. Faint calcification adjacent to the lateral humeral head may represent calcific tendinitis. 3. Subacromial spur. Electronically Signed   By: Narda Rutherford M.D.   On: 03/07/2023 20:39   ECHO TEE  Result Date: 03/07/2023    TRANSESOPHOGEAL ECHO REPORT   Patient Name:   [name] [name] Date of Exam: 03/07/2023 Medical Rec #:  [id number]        Height:       74.0 in Accession #:    [id number]       Weight:       201.0 lb Date of Birth:  07-May-1945        BSA:          2.178 m Patient Age:    77 years         BP:           129/69 mmHg Patient Gender: M                HR:           93 bpm. Exam Location:  Inpatient Procedure: Transesophageal Echo, Cardiac Doppler and Color Doppler Indications:    AFIB  History:        Patient has prior history of Echocardiogram examinations.  Sonographer:    Dondra Prader RVT RCS Referring Phys: [id number] Jeanella Craze PROCEDURE: After discussion of the risks and benefits of a TEE, an informed consent was obtained from the patient. The transesophogeal probe was passed without difficulty through the esophogus of the patient. Imaged were obtained with the patient in a supine position. Sedation performed by different physician. Image quality was good. The patient's vital signs; including heart rate, blood pressure, and oxygen saturation; remained stable throughout the procedure. Supplementary images were obtained from transthoracic windows as indicated to answer the clinical question. The patient developed no complications during the procedure. A successful direct current cardioversion was performed at 150 joules with 1  attempt.  IMPRESSIONS  1. Left ventricular ejection fraction, by estimation, is 20 to  25%. The left ventricle has severely decreased function. The left ventricular internal cavity size was dilated.  2. Right ventricular systolic function is mildly reduced. The right ventricular size is not well visualized.  3. No left atrial/left atrial appendage thrombus was detected. The LAA emptying velocity was 36 cm/s.  4. The mitral valve was not well visualized.  5. The aortic valve is normal in structure. Aortic valve regurgitation is trivial. Aortic valve sclerosis is present, with no evidence of aortic valve stenosis.  6. There is mild (Grade II) layered plaque involving the descending aorta.  7. The atrial septum is grossly normal by Color Doppler. Conclusion(s)/Recommendation(s): No LA/LAA thrombus identified. Successful cardioversion performed with restoration of normal sinus rhythm. FINDINGS  Left Ventricle: The left ventricle demonstrates global hypokinesis and regional wall motion abnormalities.Left ventricular diastolic function could not be evaluated due to atrial flutter. Left ventricular ejection fraction, by estimation, is 20 to 25%. The left ventricle has severely decreased function. The left ventricular internal cavity size was dilated. Left ventricular diastolic function could not be evaluated due to atrial fibrillation. Right Ventricle: The right ventricular size is not well visualized. Right vetricular wall thickness was not assessed. Right ventricular systolic function is mildly reduced. Left Atrium: Left atrial size was not well visualized. No left atrial/left atrial appendage thrombus was detected. The LAA emptying velocity was 36 cm/s. Right Atrium: Right atrial size was not well visualized. Pericardium: There is no evidence of pericardial effusion. Mitral Valve: The mitral valve was not well visualized. Tricuspid Valve: The tricuspid valve is normal in structure. Tricuspid valve regurgitation is mild .  No evidence of tricuspid stenosis. Aortic Valve: The aortic valve is normal in structure. Aortic valve regurgitation is trivial. Aortic valve sclerosis is present, with no evidence of aortic valve stenosis. Pulmonic Valve: The pulmonic valve was grossly normal. Pulmonic valve regurgitation is not visualized. No evidence of pulmonic stenosis. Aorta: Aortic root could not be assessed. There is mild (Grade II) layered plaque involving the descending aorta. Venous: The inferior vena cava was not well visualized. IAS/Shunts: The atrial septum is grossly normal by Color Doppler. Additional Comments: A device lead is visualized. Spectral Doppler performed. [employer] Electronically signed by Tessa Lerner Signature Date/Time: 03/07/2023/3:43:35 PM    Final    EP STUDY  Result Date: 03/07/2023 See surgical note for result.    PERTINENT LAB RESULTS: CBC: Recent Labs    03/07/23 0607 03/08/23 0340  WBC 14.9* 14.1*  HGB 11.4* 10.8*  HCT 34.7* 33.1*  PLT 369 393   CMET CMP     Component Value Date/Time   NA 131 (L) 03/08/2023 0340   NA 139 11/06/2022 1510   K 4.3 03/08/2023 0340   CL 103 03/08/2023 0340   CO2 22 03/08/2023 0340   GLUCOSE 151 (H) 03/08/2023 0340   BUN 43 (H) 03/08/2023 0340   BUN 30 (H) 11/06/2022 1510   CREATININE 2.36 (H) 03/08/2023 0340   CALCIUM 7.9 (L) 03/08/2023 0340   PROT 5.3 (L) 03/06/2023 0706   PROT 6.4 09/25/2022 0953   ALBUMIN 2.2 (L) 03/06/2023 0706   ALBUMIN 4.0 09/25/2022 0953   AST 57 (H) 03/06/2023 0706   ALT 75 (H) 03/06/2023 0706   ALKPHOS 76 03/06/2023 0706   BILITOT 0.9 03/06/2023 0706   BILITOT 0.8 09/25/2022 0953   EGFR 29 (L) 11/06/2022 1510   GFRNONAA 28 (L) 03/08/2023 0340    GFR Estimated Creatinine Clearance: 30.5 mL/min (A) (by C-G formula based on SCr of  2.36 mg/dL (H)). No results for input(s): "LIPASE", "AMYLASE" in the last 72 hours. No results for input(s): "CKTOTAL", "CKMB", "CKMBINDEX", "TROPONINI" in the last 72 hours. Invalid  input(s): "POCBNP" No results for input(s): "DDIMER" in the last 72 hours. Recent Labs    03/05/23 2203  HGBA1C 6.9*   No results for input(s): "CHOL", "HDL", "LDLCALC", "TRIG", "CHOLHDL", "LDLDIRECT" in the last 72 hours. No results for input(s): "TSH", "T4TOTAL", "T3FREE", "THYROIDAB" in the last 72 hours.  Invalid input(s): "FREET3" No results for input(s): "VITAMINB12", "FOLATE", "FERRITIN", "TIBC", "IRON", "RETICCTPCT" in the last 72 hours. Coags: No results for input(s): "INR" in the last 72 hours.  Invalid input(s): "PT" Microbiology: Recent Results (from the past 240 hour(s))  Culture, blood (routine x 2)     Status: None (Preliminary result)   Collection Time: 03/04/23 10:46 PM   Specimen: BLOOD RIGHT FOREARM  Result Value Ref Range Status   Specimen Description BLOOD RIGHT FOREARM  Final   Special Requests   Final    BOTTLES DRAWN AEROBIC AND ANAEROBIC Blood Culture results may not be optimal due to an excessive volume of blood received in culture bottles   Culture   Final    NO GROWTH 4 DAYS Performed at [facility], [address] [place], [place] [address]    Report Status PENDING  Incomplete  Culture, blood (routine x 2)     Status: None (Preliminary result)   Collection Time: 03/04/23 11:14 PM   Specimen: BLOOD LEFT FOREARM  Result Value Ref Range Status   Specimen Description BLOOD LEFT FOREARM  Final   Special Requests   Final    BOTTLES DRAWN AEROBIC AND ANAEROBIC Blood Culture adequate volume   Culture   Final    NO GROWTH 3 DAYS Performed at [facility], [address] [place], [place] [address]    Report Status PENDING  Incomplete  SARS Coronavirus 2 by RT PCR (hospital order, performed in [facility] hospital lab) *cepheid single result test* Anterior Nasal Swab     Status: None   Collection Time: 03/05/23  2:11 AM   Specimen: Anterior Nasal Swab  Result Value Ref Range Status   SARS Coronavirus 2 by RT PCR NEGATIVE NEGATIVE Final     Comment: Performed at [facility], [address] [place], [place] [address]  Respiratory (~20 pathogens) panel by PCR     Status: None   Collection Time: 03/05/23 11:14 AM   Specimen: Nasopharyngeal Swab; Respiratory  Result Value Ref Range Status   Adenovirus NOT DETECTED NOT DETECTED Final   Coronavirus 229E NOT DETECTED NOT DETECTED Final    Comment: (NOTE) The Coronavirus on the Respiratory Panel, DOES NOT test for the novel  Coronavirus (2019 nCoV)    Coronavirus HKU1 NOT DETECTED NOT DETECTED Final   Coronavirus NL63 NOT DETECTED NOT DETECTED Final   Coronavirus OC43 NOT DETECTED NOT DETECTED Final   Metapneumovirus NOT DETECTED NOT DETECTED Final   Rhinovirus / Enterovirus NOT DETECTED NOT DETECTED Final   Influenza A NOT DETECTED NOT DETECTED Final   Influenza B NOT DETECTED NOT DETECTED Final   Parainfluenza Virus 1 NOT DETECTED NOT DETECTED Final   Parainfluenza Virus 2 NOT DETECTED NOT DETECTED Final   Parainfluenza Virus 3 NOT DETECTED NOT DETECTED Final   Parainfluenza Virus 4 NOT DETECTED NOT DETECTED Final   Respiratory Syncytial Virus NOT DETECTED NOT DETECTED Final   Bordetella pertussis NOT DETECTED NOT DETECTED Final   Bordetella  Parapertussis NOT DETECTED NOT DETECTED Final   Chlamydophila pneumoniae NOT DETECTED NOT DETECTED Final   Mycoplasma pneumoniae NOT DETECTED NOT DETECTED Final    Comment: Performed at [facility], [address] [place], [place] [address]  MRSA Next Gen by PCR, Nasal     Status: None   Collection Time: 03/05/23 11:50 PM   Specimen: Nasal Mucosa; Nasal Swab  Result Value Ref Range Status   MRSA by PCR Next Gen NOT DETECTED NOT DETECTED Final    Comment: (NOTE) The GeneXpert MRSA Assay (FDA approved for NASAL specimens only), is one component of a comprehensive MRSA colonization surveillance program. It is not intended to diagnose MRSA infection nor to guide or monitor treatment for MRSA infections. Test performance  is not FDA approved in patients less than 60 years old. Performed at [facility], [address] [place], [place] [address]     FURTHER DISCHARGE INSTRUCTIONS:  Get Medicines reviewed and adjusted: Please take all your medications with you for your next visit with your Primary MD  Laboratory/radiological data: Please request your Primary MD to go over all hospital tests and procedure/radiological results at the follow up, please ask your Primary MD to get all Hospital records sent to his/her office.  In some cases, they will be blood work, cultures and biopsy results pending at the time of your discharge. Please request that your primary care M.D. goes through all the records of your hospital data and follows up on these results.  Also Note the following: If you experience worsening of your admission symptoms, develop shortness of breath, life threatening emergency, suicidal or homicidal thoughts you must seek medical attention immediately by calling 911 or calling your MD immediately  if symptoms less severe.  You must read complete instructions/literature along with all the possible adverse reactions/side effects for all the Medicines you take and that have been prescribed to you. Take any new Medicines after you have completely understood and accpet all the possible adverse reactions/side effects.   Do not drive when taking Pain medications or sleeping medications (Benzodaizepines)  Do not take more than prescribed Pain, Sleep and Anxiety Medications. It is not advisable to combine anxiety,sleep and pain medications without talking with your primary care practitioner  Special Instructions: If you have smoked or chewed Tobacco  in the last 2 yrs please stop smoking, stop any regular Alcohol  and or any Recreational drug use.  Wear Seat belts while driving.  Please note: You were cared for by a hospitalist during your hospital stay. Once you are discharged, your primary care  physician will handle any further medical issues. Please note that NO REFILLS for any discharge medications will be authorized once you are discharged, as it is imperative that you return to your primary care physician (or establish a relationship with a primary care physician if you do not have one) for your post hospital discharge needs so that they can reassess your need for medications and monitor your lab values.  Total Time spent coordinating discharge including counseling, education and face to face time equals greater than 30 minutes.  Signed: Kieanna Rollo 03/08/2023 11:00 AM

## 2023-03-08 NOTE — Plan of Care (Signed)

## 2023-03-08 NOTE — Progress Notes (Signed)
Orthopedic Tech Progress Note Patient Details:  [name] [name] [birth year] [id number]  Ortho Devices Type of Ortho Device: Arm sling Ortho Device/Splint Location: RUE Ortho Device/Splint Interventions: Ordered, Other (comment)  Left at bedside Post Interventions Patient Tolerated: Well  [name] 03/08/2023, 10:57 AM

## 2023-03-08 NOTE — Consult Note (Signed)
Reason for Consult:Right clavicle fx Referring Physician: Jeoffrey Massed Time called: 2440 Time at bedside: 1009   [name] [name] is an 77 y.o. male.  HPI: [name] [name] had a syncopal event and fell down an unknown number of stairs 3d ago. He was brought to the ED and admitted. He c/o right shoulder pain and x-rays were done yesterday that showed a right distal clavicle fx and possible AC injury and orthopedic surgery was consulted the following morning. He is RHD and lives at home with his wife.  Past Medical History:  Diagnosis Date   AICD (automatic cardioverter/defibrillator) present    Arthritis    Bladder cancer (HCC)    Borderline glaucoma    Chronic systolic (congestive) heart failure (HCC)    Coronary artery disease    Diabetes mellitus type 2, controlled (HCC) ORAL MED   Essential hypertension    Heart murmur    Hepatitis    Medical history non-contributory    Paroxysmal A-fib (HCC)    Paroxysmal A-fib (HCC) 08/20/2020   Peripheral vascular disease (HCC)    Pneumonia     Past Surgical History:  Procedure Laterality Date   CARDIOVERSION N/A 08/10/2021   Procedure: CARDIOVERSION;  Surgeon: Dolores Patty, MD;  Location: [facility] ENDOSCOPY;  Service: Cardiovascular;  Laterality: N/A;   CARDIOVERSION N/A 11/09/2022   Procedure: CARDIOVERSION;  Surgeon: Maisie Fus, MD;  Location: [facility] INVASIVE CV LAB;  Service: Cardiovascular;  Laterality: N/A;   CARDIOVERSION N/A 03/07/2023   Procedure: CARDIOVERSION (CATH LAB);  Surgeon: Tessa Lerner, DO;  Location: [facility] INVASIVE CV LAB;  Service: Cardiovascular;  Laterality: N/A;   CAROTID DUPLEX SCAN  07-26-2010   BILATERAL ICA  STENOSIS 1% - 39%   CCM GENERATOR INSERTION N/A 01/04/2023   Procedure: CCM GENERATOR INSERTION;  Surgeon: Duke Salvia, MD;  Location: [facility] INVASIVE CV LAB;  Service: Cardiovascular;  Laterality: N/A;   CHOLECYSTECTOMY N/A 08/22/2020   Procedure: LAPAROSCOPIC CHOLECYSTECTOMY;  Surgeon: Emelia Loron, MD;   Location: [facility] OR;  Service: General;  Laterality: N/A;   CORONARY ARTERY BYPASS GRAFT N/A 04/08/2017   Procedure: CORONARY ARTERY BYPASS GRAFTING (CABG) TIMES TWO USING LEFT INTERNAL MAMMARY ARTERY AND LEFT SAPHENOUS LEG VEIN HARVESTED ENDOSCOPICALLY.  LEG VEIN ALSO HARVESTED FROM THE RIGHT LEG;  Surgeon: Alleen Borne, MD;  Location: [facility] OR;  Service: Open Heart Surgery;  Laterality: N/A;   CYSTOSCOPY WITH BIOPSY N/A 08/01/2012   Procedure: CYSTOSCOPY WITH BIOPSY BLADDER BIOPSY   ;  Surgeon: Antony Haste, MD;  Location: [facility];  Service: Urology;  Laterality: N/A;   ERCP N/A 08/21/2020   Procedure: ENDOSCOPIC RETROGRADE CHOLANGIOPANCREATOGRAPHY (ERCP);  Surgeon: Jeani Hawking, MD;  Location: [facility] ENDOSCOPY;  Service: Endoscopy;  Laterality: N/A;   FULGURATION OF BLADDER TUMOR N/A 08/01/2012   Procedure: FULGURATION OF BLADDER TUMOR;  Surgeon: Antony Haste, MD;  Location: [facility];  Service: Urology;  Laterality: N/A;   ICD IMPLANT  11/20/2017   ICD IMPLANT N/A 11/20/2017   Procedure: ICD IMPLANT;  Surgeon: Duke Salvia, MD;  Location: [facility] INVASIVE CV LAB;  Service: Cardiovascular;  Laterality: N/A;   LEFT HEART CATH AND CORONARY ANGIOGRAPHY N/A 03/31/2017   Procedure: LEFT HEART CATH AND CORONARY ANGIOGRAPHY;  Surgeon: Marykay Lex, MD;  Location: [facility] INVASIVE CV LAB;  Service: Cardiovascular;  Laterality: N/A;   LUMBAR LAMINECTOMY/DECOMPRESSION MICRODISCECTOMY Bilateral 06/16/2019   Procedure: Bilateral Lumbar Four-Five Laminectomy and Foraminotomy;  Surgeon: Julio Sicks, MD;  Location: [facility] OR;  Service: Neurosurgery;  Laterality: Bilateral;  posterior   REMOVAL OF STONES  08/21/2020   Procedure: REMOVAL OF STONES;  Surgeon: Jeani Hawking, MD;  Location: [facility] ENDOSCOPY;  Service: Endoscopy;;   RIGHT HEART CATH Right 03/31/2017   Procedure: RIGHT HEART CATH;  Surgeon: Marykay Lex, MD;  Location: [facility] INVASIVE CV LAB;  Service: Cardiovascular;   Laterality: Right;   SPHINCTEROTOMY  08/21/2020   Procedure: SPHINCTEROTOMY;  Surgeon: Jeani Hawking, MD;  Location: [facility] ENDOSCOPY;  Service: Endoscopy;;   TEE WITHOUT CARDIOVERSION N/A 04/08/2017   Procedure: TRANSESOPHAGEAL ECHOCARDIOGRAM (TEE);  Surgeon: Alleen Borne, MD;  Location: [facility] OR;  Service: Open Heart Surgery;  Laterality: N/A;   TRANSESOPHAGEAL ECHOCARDIOGRAM (CATH LAB) N/A 03/07/2023   Procedure: TRANSESOPHAGEAL ECHOCARDIOGRAM;  Surgeon: Tessa Lerner, DO;  [facility] INVASIVE CV LAB;  Service: Cardiovascular;  Laterality: N/A;   TRANSURETHRAL RESECTION OF BLADDER TUMOR  05/29/2011   Procedure: TRANSURETHRAL RESECTION OF BLADDER TUMOR (TURBT);  Surgeon: Antony Haste, MD;  Location: [facility];  Service: Urology;  Laterality: N/A;   VENTRICULAR ASSIST DEVICE INSERTION Right 03/31/2017   Procedure: VENTRICULAR ASSIST DEVICE INSERTION;  Surgeon: Marykay Lex, MD;  Location: [facility] INVASIVE CV LAB;  Service: Cardiovascular;  Laterality: Right;    Family History  Problem Relation Age of Onset   Heart attack Sister    Heart disease Sister     Social History:  reports that he quit smoking about 5 years ago. His smoking use included cigarettes. He started smoking about 45 years ago. He has a 40 pack-year smoking history. He has never used smokeless tobacco. He reports that he does not currently use alcohol. He reports that he does not use drugs.  Allergies:  Allergies  Allergen Reactions   Spironolactone Other (See Comments)    Painful gynecomastia   Empagliflozin Rash    Rash at the groin Jardiance   Niacin And Related Hives    Medications: I have reviewed the patient's current medications.  Results for orders placed or performed during the hospital encounter of 03/04/23 (from the past 48 hour(s))  Glucose, capillary     Status: Abnormal   Collection Time: 03/06/23  1:10 PM  Result Value Ref Range   Glucose-Capillary 136 (H) 70 - 99 mg/dL     Comment: Glucose reference range applies only to samples taken after fasting for at least 8 hours.  Glucose, capillary     Status: Abnormal   Collection Time: 03/06/23  4:37 PM  Result Value Ref Range   Glucose-Capillary 148 (H) 70 - 99 mg/dL    Comment: Glucose reference range applies only to samples taken after fasting for at least 8 hours.  Glucose, capillary     Status: Abnormal   Collection Time: 03/06/23  9:23 PM  Result Value Ref Range   Glucose-Capillary 209 (H) 70 - 99 mg/dL    Comment: Glucose reference range applies only to samples taken after fasting for at least 8 hours.  CBC     Status: Abnormal   Collection Time: 03/07/23  6:07 AM  Result Value Ref Range   WBC 14.9 (H) 4.0 - 10.5 K/uL   RBC 4.11 (L) 4.22 - 5.81 MIL/uL   Hemoglobin 11.4 (L) 13.0 - 17.0 g/dL   HCT 37.1 (L) 06.2 - 69.4 %   MCV 84.4 80.0 - 100.0 fL   MCH 27.7 26.0 - 34.0 pg   MCHC 32.9 30.0 - 36.0 g/dL   RDW 85.4 (H) 62.7 - 03.5 %  Platelets 369 150 - 400 K/uL   nRBC 0.0 0.0 - 0.2 %    Comment: Performed at [facility] Lab, [address] [place], [place] [address]  Basic metabolic panel     Status: Abnormal   Collection Time: 03/07/23  6:07 AM  Result Value Ref Range   Sodium 131 (L) 135 - 145 mmol/L   Potassium 4.3 3.5 - 5.1 mmol/L   Chloride 104 98 - 111 mmol/L   CO2 19 (L) 22 - 32 mmol/L   Glucose, Bld 149 (H) 70 - 99 mg/dL    Comment: Glucose reference range applies only to samples taken after fasting for at least 8 hours.   BUN 37 (H) 8 - 23 mg/dL   Creatinine, Ser 6.04 (H) 0.61 - 1.24 mg/dL   Calcium 8.2 (L) 8.9 - 10.3 mg/dL   GFR, Estimated 31 (L) >60 mL/min    Comment: (NOTE) Calculated using the CKD-EPI Creatinine Equation (2021)    Anion gap 8 5 - 15    Comment: Performed at [facility] Lab, [address], [place], [place] [address]  Glucose, capillary     Status: Abnormal   Collection Time: 03/07/23  7:54 AM  Result Value Ref Range   Glucose-Capillary 146 (H) 70 - 99 mg/dL     Comment: Glucose reference range applies only to samples taken after fasting for at least 8 hours.  Glucose, capillary     Status: Abnormal   Collection Time: 03/07/23  2:47 PM  Result Value Ref Range   Glucose-Capillary 124 (H) 70 - 99 mg/dL    Comment: Glucose reference range applies only to samples taken after fasting for at least 8 hours.  Glucose, capillary     Status: Abnormal   Collection Time: 03/07/23  8:26 PM  Result Value Ref Range   Glucose-Capillary 181 (H) 70 - 99 mg/dL    Comment: Glucose reference range applies only to samples taken after fasting for at least 8 hours.   Comment 1 Notify RN    Comment 2 Document in Chart   CBC     Status: Abnormal   Collection Time: 03/08/23  3:40 AM  Result Value Ref Range   WBC 14.1 (H) 4.0 - 10.5 K/uL   RBC 3.96 (L) 4.22 - 5.81 MIL/uL   Hemoglobin 10.8 (L) 13.0 - 17.0 g/dL   HCT 11.9 (L) 14.7 - 82.9 %   MCV 83.6 80.0 - 100.0 fL   MCH 27.3 26.0 - 34.0 pg   MCHC 32.6 30.0 - 36.0 g/dL   RDW 56.2 (H) 13.0 - 86.5 %   Platelets 393 150 - 400 K/uL   nRBC 0.0 0.0 - 0.2 %    Comment: Performed at [facility] Lab, [address], [place], [place] [address]  Basic metabolic panel     Status: Abnormal   Collection Time: 03/08/23  3:40 AM  Result Value Ref Range   Sodium 131 (L) 135 - 145 mmol/L   Potassium 4.3 3.5 - 5.1 mmol/L   Chloride 103 98 - 111 mmol/L   CO2 22 22 - 32 mmol/L   Glucose, Bld 151 (H) 70 - 99 mg/dL    Comment: Glucose reference range applies only to samples taken after fasting for at least 8 hours.   BUN 43 (H) 8 - 23 mg/dL   Creatinine, Ser 6.29 (H) 0.61 - 1.24 mg/dL   Calcium 7.9 (L) 8.9 - 10.3 mg/dL   GFR, Estimated 28 (L) >60  mL/min    Comment: (NOTE) Calculated using the CKD-EPI Creatinine Equation (2021)    Anion gap 6 5 - 15    Comment: Performed at [facility] Lab, [address] [place], [place] [address]  Magnesium     Status: None   Collection Time: 03/08/23  3:40 AM  Result Value Ref Range    Magnesium 2.4 1.7 - 2.4 mg/dL    Comment: Performed at [facility] Lab, [address] [place], [place] [address]  Glucose, capillary     Status: Abnormal   Collection Time: 03/08/23  8:02 AM  Result Value Ref Range   Glucose-Capillary 149 (H) 70 - 99 mg/dL    Comment: Glucose reference range applies only to samples taken after fasting for at least 8 hours.    DG Shoulder Right  Result Date: 03/07/2023 CLINICAL DATA:  Pain after fall. EXAM: RIGHT SHOULDER - 2+ VIEW COMPARISON:  No prior shoulder exams. Chest radiograph 01/16/2023 reviewed. FINDINGS: Mild acromioclavicular widening, 9 mm in the superior aspect. Possible small fracture fragment arising from the distal clavicle, not seen previously. Right clavicle is elevated less than 1 shaft width with respect to the acromion. This was not definitively seen on prior chest radiograph. Glenohumeral alignment is preserved. Faint calcification adjacent to the lateral humeral head. There is a subacromial spur. IMPRESSION: 1. Mild acromioclavicular widening with possible small fracture fragment arising from the distal clavicle. Findings suggestive of AC joint injury. 2. Faint calcification adjacent to the lateral humeral head may represent calcific tendinitis. 3. Subacromial spur. Electronically Signed   By: Narda Rutherford M.D.   On: 03/07/2023 20:39   ECHO TEE  Result Date: 03/07/2023    TRANSESOPHOGEAL ECHO REPORT   Patient Name:   [name] [name] Date of Exam: 03/07/2023 Medical Rec #:  [id number]        Height:       74.0 in Accession #:    [id number]       Weight:       201.0 lb Date of Birth:  12/11/1945        BSA:          2.178 m Patient Age:    77 years         BP:           129/69 mmHg Patient Gender: M                HR:           93 bpm. Exam Location:  Inpatient Procedure: Transesophageal Echo, Cardiac Doppler and Color Doppler Indications:    AFIB  History:        Patient has prior history of Echocardiogram examinations.  Sonographer:     Dondra Prader RVT RCS Referring Phys: [id number] Jeanella Craze PROCEDURE: After discussion of the risks and benefits of a TEE, an informed consent was obtained from the patient. The transesophogeal probe was passed without difficulty through the esophogus of the patient. Imaged were obtained with the patient in a supine position. Sedation performed by different physician. Image quality was good. The patient's vital signs; including heart rate, blood pressure, and oxygen saturation; remained stable throughout the procedure. Supplementary images were obtained from transthoracic windows as indicated to answer the clinical question. The patient developed no complications during the procedure. A successful direct current cardioversion was performed at 150 joules with 1 attempt.  IMPRESSIONS  1. Left ventricular ejection fraction, by estimation, is 20 to 25%. The  left ventricle has severely decreased function. The left ventricular internal cavity size was dilated.  2. Right ventricular systolic function is mildly reduced. The right ventricular size is not well visualized.  3. No left atrial/left atrial appendage thrombus was detected. The LAA emptying velocity was 36 cm/s.  4. The mitral valve was not well visualized.  5. The aortic valve is normal in structure. Aortic valve regurgitation is trivial. Aortic valve sclerosis is present, with no evidence of aortic valve stenosis.  6. There is mild (Grade II) layered plaque involving the descending aorta.  7. The atrial septum is grossly normal by Color Doppler. Conclusion(s)/Recommendation(s): No LA/LAA thrombus identified. Successful cardioversion performed with restoration of normal sinus rhythm. FINDINGS  Left Ventricle: The left ventricle demonstrates global hypokinesis and regional wall motion abnormalities.Left ventricular diastolic function could not be evaluated due to atrial flutter. Left ventricular ejection fraction, by estimation, is 20 to 25%. The left ventricle  has severely decreased function. The left ventricular internal cavity size was dilated. Left ventricular diastolic function could not be evaluated due to atrial fibrillation. Right Ventricle: The right ventricular size is not well visualized. Right vetricular wall thickness was not assessed. Right ventricular systolic function is mildly reduced. Left Atrium: Left atrial size was not well visualized. No left atrial/left atrial appendage thrombus was detected. The LAA emptying velocity was 36 cm/s. Right Atrium: Right atrial size was not well visualized. Pericardium: There is no evidence of pericardial effusion. Mitral Valve: The mitral valve was not well visualized. Tricuspid Valve: The tricuspid valve is normal in structure. Tricuspid valve regurgitation is mild . No evidence of tricuspid stenosis. Aortic Valve: The aortic valve is normal in structure. Aortic valve regurgitation is trivial. Aortic valve sclerosis is present, with no evidence of aortic valve stenosis. Pulmonic Valve: The pulmonic valve was grossly normal. Pulmonic valve regurgitation is not visualized. No evidence of pulmonic stenosis. Aorta: Aortic root could not be assessed. There is mild (Grade II) layered plaque involving the descending aorta. Venous: The inferior vena cava was not well visualized. IAS/Shunts: The atrial septum is grossly normal by Color Doppler. Additional Comments: A device lead is visualized. Spectral Doppler performed. [employer] Electronically signed by Tessa Lerner Signature Date/Time: 03/07/2023/3:43:35 PM    Final    EP STUDY  Result Date: 03/07/2023 See surgical note for result.   Review of Systems  HENT:  Negative for ear discharge, ear pain, hearing loss and tinnitus.   Eyes:  Negative for photophobia and pain.  Respiratory:  Negative for cough and shortness of breath.   Cardiovascular:  Negative for chest pain.  Gastrointestinal:  Negative for abdominal pain, nausea and vomiting.  Genitourinary:  Negative  for dysuria, flank pain, frequency and urgency.  Musculoskeletal:  Positive for arthralgias (Right shoulder). Negative for back pain, myalgias and neck pain.  Neurological:  Negative for dizziness and headaches.  Hematological:  Does not bruise/bleed easily.  Psychiatric/Behavioral:  The patient is not nervous/anxious.    Blood pressure 133/67, pulse 75, temperature 98.3 F (36.8 C), temperature source Oral, resp. rate 20, height 6\' 2"  (1.88 m), weight 91.2 kg, SpO2 90%. Physical Exam Constitutional:      General: He is not in acute distress.    Appearance: He is well-developed. He is not diaphoretic.  HENT:     Head: Normocephalic and atraumatic.  Eyes:     General: No scleral icterus.       Right eye: No discharge.        Left eye:  No discharge.     Conjunctiva/sclera: Conjunctivae normal.  Cardiovascular:     Rate and Rhythm: Normal rate and regular rhythm.  Pulmonary:     Effort: Pulmonary effort is normal. No respiratory distress.  Musculoskeletal:     Cervical back: Normal range of motion.     Comments: Right shoulder, elbow, wrist, digits- no skin wounds, mod TTP ant shoulder with posterior ecchymoses, no instability, no blocks to motion  Sens  Ax/R/M/U intact  Mot   Ax/ R/ PIN/ M/ AIN/ U intact  Rad 2+  Skin:    General: Skin is warm and dry.  Neurological:     Mental Status: He is alert.  Psychiatric:        Mood and Affect: Mood normal.        Behavior: Behavior normal.     Assessment/Plan: Right distal clavicle fx -- Plan non-operative management with sling and NWB, gentle ROM encouraged. F/u with Dr. Susa Simmonds in 2 weeks.    Freeman Caldron, PA-C Orthopedic Surgery [phone] 03/08/2023, 10:26 AM

## 2023-03-08 NOTE — Progress Notes (Signed)
Occupational Therapy Treatment Patient Details Name: [name] [name] MRN: [id number] DOB: 07/20/1945 Today's Date: 03/08/2023   History of present illness 77 year old male with history of chronic systolic/diastolic heart failure status post ICD, paroxysmal atrial fibrillation complicated by sick sinus syndrome status post pacemaker, is being admitted with SIRS criteria, generalized weakness.  He presented with complaint of single presyncopal episode resulting in a fall in which he hit his head.  Shoulder x-ray (+) for a/c fx.   OT comments  Patient admitted for the diagnosis above.  Shoulder x-ray performed yesterday: Mild acromioclavicular widening with possible small fracture fragment arising from the distal clavicle.  Patient discharging today.  OT provided elbow distal HEP, pendulum HEP, sling donning sheet, patient and spouse articulate good understanding.  Patient able to get dressed with up to Min A from a sit to stand level.  OT will follow if he remains in the acute setting, follow MD recommendations for follow up.  NWB status reviewed with patient.        If plan is discharge home, recommend the following:  Assist for transportation;Assistance with cooking/housework;A little help with walking and/or transfers;A little help with bathing/dressing/bathroom   Equipment Recommendations  BSC/3in1    Recommendations for Other Services      Precautions / Restrictions Precautions Precautions: Fall Restrictions Weight Bearing Restrictions: Yes RUE Weight Bearing: Non weight bearing       Mobility Bed Mobility Overal bed mobility: Needs Assistance Bed Mobility: Supine to Sit       Sit to supine: Min assist, Mod assist        Transfers Overall transfer level: Needs assistance Equipment used: 1 person hand held assist Transfers: Sit to/from Stand Sit to Stand: Contact guard assist, From elevated surface                 Balance Overall balance assessment: Needs  assistance Sitting-balance support: Feet supported Sitting balance-Leahy Scale: Good     Standing balance support: Single extremity supported, Bilateral upper extremity supported Standing balance-Leahy Scale: Fair                             ADL either performed or assessed with clinical judgement   ADL                   Upper Body Dressing : Minimal assistance;Moderate assistance;Sitting;Standing   Lower Body Dressing: Moderate assistance;Sit to/from stand;Minimal assistance   Toilet Transfer: Minimal assistance;Stand-pivot                  Extremity/Trunk Assessment Upper Extremity Assessment RUE Deficits / Details: continued painful ROM to shoulder, significant bruising to top of the shoulder RUE: Shoulder pain with ROM RUE Sensation: WNL RUE Coordination: WNL   Lower Extremity Assessment Lower Extremity Assessment: Defer to PT evaluation   Cervical / Trunk Assessment Cervical / Trunk Assessment: Kyphotic    Vision Baseline Vision/History: 1 Wears glasses Patient Visual Report: No change from baseline     Perception Perception Perception: Within Functional Limits   Praxis Praxis Praxis: WFL    Cognition Arousal: Alert Behavior During Therapy: WFL for tasks assessed/performed Overall Cognitive Status: Impaired/Different from baseline Area of Impairment: Safety/judgement, Memory                     Memory: Decreased short-term memory   Safety/Judgement: Decreased awareness of safety, Decreased awareness of deficits  Exercises Shoulder Exercises Pendulum Exercise: AAROM, Right, 10 reps Elbow Flexion: AROM, Right, 10 reps, Seated Elbow Extension: AROM, Right, 10 reps, Seated Wrist Flexion: AROM, Right, 10 reps, Seated Wrist Extension: AROM, Right, 10 reps, Seated Digit Composite Flexion: AROM, Right, 10 reps, Supine Composite Extension: AROM, Right, 10 reps, Seated    Shoulder Instructions        General Comments      Pertinent Vitals/ Pain       Pain Assessment Pain Assessment: Faces Faces Pain Scale: Hurts little more Pain Location: R shoulder > than R hip Pain Descriptors / Indicators: Tender, Discomfort, Grimacing, Guarding                                                          Frequency  Min 1X/week        Progress Toward Goals  OT Goals(current goals can now be found in the care plan section)  Progress towards OT goals: Progressing toward goals  Acute Rehab OT Goals OT Goal Formulation: With patient Time For Goal Achievement: 03/19/23 Potential to Achieve Goals: Good  Plan      Co-evaluation                 AM-PAC OT "6 Clicks" Daily Activity     Outcome Measure   Help from another person eating meals?: None Help from another person taking care of personal grooming?: A Little Help from another person toileting, which includes using toliet, bedpan, or urinal?: A Little Help from another person bathing (including washing, rinsing, drying)?: A Little Help from another person to put on and taking off regular upper body clothing?: A Lot Help from another person to put on and taking off regular lower body clothing?: A Little 6 Click Score: 18    End of Session    OT Visit Diagnosis: Unsteadiness on feet (R26.81);History of falling (Z91.81);Pain Pain - Right/Left: Right Pain - part of body: Shoulder;Hip   Activity Tolerance Patient tolerated treatment well   Patient Left in chair;with call bell/phone within reach;with family/visitor present   Nurse Communication Mobility status        Time: 8416-6063 OT Time Calculation (min): 28 min  Charges: OT General Charges $OT Visit: 1 Visit OT Treatments $Self Care/Home Management : 23-37 mins  03/08/2023  RP, OTR/L  Acute Rehabilitation Services  Office:  [phone]   [name] 03/08/2023, 11:43 AM

## 2023-03-08 NOTE — Progress Notes (Addendum)
  Patient Name: [name] [name] Date of Encounter: 03/08/2023  Primary Cardiologist: Arvilla Meres, MD Electrophysiologist: Sherryl Manges, MD  Interval Summary   The patient is doing well today.  At this time, the patient denies chest pain, shortness of breath, or any new concerns.  Vital Signs    Vitals:   03/07/23 2100 03/07/23 2200 03/07/23 2300 03/08/23 0400  BP: 121/65 124/61 119/61   Pulse: 81 81 84   Resp: (!) 23 (!) 23 19   Temp:   (!) 96.7 F (35.9 C) 98.9 F (37.2 C)  TempSrc:   Oral Oral  SpO2: (!) 88% (!) 89% 91%   Weight:      Height:        Intake/Output Summary (Last 24 hours) at 03/08/2023 0644 Last data filed at 03/08/2023 0600 Gross per 24 hour  Intake --  Output 675 ml  Net -675 ml   Filed Weights   03/04/23 2124  Weight: 91.2 kg    Physical Exam    GEN- The patient is well appearing, alert and oriented x 3 today.   Lungs- Clear to ausculation bilaterally, normal work of breathing Cardiac- Regular rate and rhythm, no murmurs, rubs or gallops GI- soft, NT, ND, + BS Extremities- no clubbing or cyanosis. No edema  Telemetry    SR 70's, one NSVT, occ PVC's (personally reviewed)  STUDIES: TEE 11/7 > LVEF 20-25%, no LA/LAA thrombus  Hospital Course    [name] [name] is a 77 y.o. male with a history of chronic HFrEF s/p ICD, paroxysmal atrial fibrillation/flutter, CAD s/p CABG 2018, LV thrombus, PAD, CKD 3B, DM2 admitted 11/4 with concern for for syncopal episode in setting of possible SIRS/sepsis. Hx CCM placement in 12/2022 (interruption of OAC at that time for procedure).  Found to be in AFL this admission.  S/p TEE (no LA/LAA thrombus) and DCCV on 11/7 with restoration of NSR.   Assessment & Plan    Atrial Flutter  Chronic Systolic CHF s/p ICD & CCM  CAD s/p CABG  Hx LV Thrombus  Prolonged QT Syncope PTA thought secondary to SIRS/Sepsis Physiology (Flu like sx)   Plan: S/p DCCV 11/8 with restoration of NSR. Baseline QTc in SR ~  . Device -continue amiodarone 200 mg every day  -Eliquis for stroke prophylaxis, can not interrupt for 30 days (discussed with patient as he had a dental procedure planned) -tele monitoring  -avoid QT prolonging agents -EKG with QTc ~494-563ms -discussed with [employer] (CCM), no adjustment to be made to device now that the patient is in SR.  Plan for CCM review at next clinic visit with Dr. Graciela Husbands 04/10/23. Have spoken to industry and they will plan to be there for visit.    EP will be avilable PRN. Please call back if new needs arise.     For questions or updates, please contact [facility] Please consult [email or web address] for contact info under Cardiology/STEMI.  Signed, Canary Brim, MSN, APRN, NP-C, AGACNP-BC [facility] - Electrophysiology  03/08/2023, 9:31 AM

## 2023-03-09 LAB — CULTURE, BLOOD (ROUTINE X 2): Culture: NO GROWTH

## 2023-03-10 ENCOUNTER — Encounter (HOSPITAL_COMMUNITY): Payer: Self-pay | Admitting: Cardiology

## 2023-03-10 LAB — CULTURE, BLOOD (ROUTINE X 2)
Culture: NO GROWTH
Special Requests: ADEQUATE

## 2023-03-10 NOTE — Anesthesia Postprocedure Evaluation (Signed)
Anesthesia Post Note  Patient: [name] [name]  Procedure(s) Performed: TRANSESOPHAGEAL ECHOCARDIOGRAM CARDIOVERSION (CATH LAB)     Patient location during evaluation: Cath Lab Anesthesia Type: MAC Level of consciousness: awake and alert Pain management: pain level controlled Vital Signs Assessment: post-procedure vital signs reviewed and stable Respiratory status: spontaneous breathing, nonlabored ventilation and respiratory function stable Cardiovascular status: blood pressure returned to baseline and stable Postop Assessment: no apparent nausea or vomiting Anesthetic complications: no   No notable events documented.          Makenzy Krist

## 2023-03-16 ENCOUNTER — Encounter (HOSPITAL_COMMUNITY): Payer: Self-pay | Admitting: Internal Medicine

## 2023-03-25 ENCOUNTER — Emergency Department (HOSPITAL_COMMUNITY): Payer: Medicare Other

## 2023-03-25 ENCOUNTER — Other Ambulatory Visit: Payer: Self-pay

## 2023-03-25 ENCOUNTER — Ambulatory Visit: Payer: Medicare Other | Attending: Internal Medicine

## 2023-03-25 ENCOUNTER — Inpatient Hospital Stay (HOSPITAL_COMMUNITY)
Admission: EM | Admit: 2023-03-25 | Discharge: 2023-04-08 | DRG: 291 | Disposition: A | Discharge status: Rehabilitation Facility | Payer: Medicare Other | Attending: Family Medicine | Admitting: Family Medicine

## 2023-03-25 DIAGNOSIS — E876 Hypokalemia: Secondary | ICD-10-CM | POA: Diagnosis not present

## 2023-03-25 DIAGNOSIS — M25551 Pain in right hip: Principal | ICD-10-CM | POA: Diagnosis present

## 2023-03-25 DIAGNOSIS — M7989 Other specified soft tissue disorders: Secondary | ICD-10-CM | POA: Diagnosis present

## 2023-03-25 DIAGNOSIS — I130 Hypertensive heart and chronic kidney disease with heart failure and stage 1 through stage 4 chronic kidney disease, or unspecified chronic kidney disease: Secondary | ICD-10-CM | POA: Diagnosis not present

## 2023-03-25 DIAGNOSIS — Z87891 Personal history of nicotine dependence: Secondary | ICD-10-CM

## 2023-03-25 DIAGNOSIS — E860 Dehydration: Secondary | ICD-10-CM | POA: Diagnosis present

## 2023-03-25 DIAGNOSIS — N1832 Chronic kidney disease, stage 3b: Secondary | ICD-10-CM | POA: Diagnosis present

## 2023-03-25 DIAGNOSIS — I959 Hypotension, unspecified: Secondary | ICD-10-CM | POA: Diagnosis not present

## 2023-03-25 DIAGNOSIS — Z9049 Acquired absence of other specified parts of digestive tract: Secondary | ICD-10-CM

## 2023-03-25 DIAGNOSIS — Z79899 Other long term (current) drug therapy: Secondary | ICD-10-CM

## 2023-03-25 DIAGNOSIS — I252 Old myocardial infarction: Secondary | ICD-10-CM

## 2023-03-25 DIAGNOSIS — Z951 Presence of aortocoronary bypass graft: Secondary | ICD-10-CM

## 2023-03-25 DIAGNOSIS — N179 Acute kidney failure, unspecified: Secondary | ICD-10-CM

## 2023-03-25 DIAGNOSIS — K761 Chronic passive congestion of liver: Secondary | ICD-10-CM | POA: Diagnosis present

## 2023-03-25 DIAGNOSIS — J189 Pneumonia, unspecified organism: Secondary | ICD-10-CM | POA: Diagnosis present

## 2023-03-25 DIAGNOSIS — Z7901 Long term (current) use of anticoagulants: Secondary | ICD-10-CM

## 2023-03-25 DIAGNOSIS — I5042 Chronic combined systolic (congestive) and diastolic (congestive) heart failure: Secondary | ICD-10-CM | POA: Diagnosis not present

## 2023-03-25 DIAGNOSIS — E1151 Type 2 diabetes mellitus with diabetic peripheral angiopathy without gangrene: Secondary | ICD-10-CM | POA: Diagnosis present

## 2023-03-25 DIAGNOSIS — R001 Bradycardia, unspecified: Secondary | ICD-10-CM | POA: Diagnosis present

## 2023-03-25 DIAGNOSIS — I480 Paroxysmal atrial fibrillation: Secondary | ICD-10-CM | POA: Diagnosis present

## 2023-03-25 DIAGNOSIS — E1122 Type 2 diabetes mellitus with diabetic chronic kidney disease: Secondary | ICD-10-CM | POA: Diagnosis present

## 2023-03-25 DIAGNOSIS — D631 Anemia in chronic kidney disease: Secondary | ICD-10-CM | POA: Diagnosis present

## 2023-03-25 DIAGNOSIS — K683 Retroperitoneal hematoma: Secondary | ICD-10-CM | POA: Diagnosis not present

## 2023-03-25 DIAGNOSIS — I509 Heart failure, unspecified: Secondary | ICD-10-CM

## 2023-03-25 DIAGNOSIS — Z95810 Presence of automatic (implantable) cardiac defibrillator: Secondary | ICD-10-CM

## 2023-03-25 DIAGNOSIS — E871 Hypo-osmolality and hyponatremia: Secondary | ICD-10-CM | POA: Diagnosis present

## 2023-03-25 DIAGNOSIS — Z8249 Family history of ischemic heart disease and other diseases of the circulatory system: Secondary | ICD-10-CM

## 2023-03-25 DIAGNOSIS — J440 Chronic obstructive pulmonary disease with acute lower respiratory infection: Secondary | ICD-10-CM | POA: Diagnosis present

## 2023-03-25 DIAGNOSIS — S36892A Contusion of other intra-abdominal organs, initial encounter: Secondary | ICD-10-CM

## 2023-03-25 DIAGNOSIS — R911 Solitary pulmonary nodule: Secondary | ICD-10-CM | POA: Diagnosis present

## 2023-03-25 DIAGNOSIS — I2510 Atherosclerotic heart disease of native coronary artery without angina pectoris: Secondary | ICD-10-CM | POA: Diagnosis present

## 2023-03-25 DIAGNOSIS — L299 Pruritus, unspecified: Secondary | ICD-10-CM | POA: Diagnosis not present

## 2023-03-25 DIAGNOSIS — I5043 Acute on chronic combined systolic (congestive) and diastolic (congestive) heart failure: Secondary | ICD-10-CM | POA: Diagnosis present

## 2023-03-25 DIAGNOSIS — Z1152 Encounter for screening for COVID-19: Secondary | ICD-10-CM

## 2023-03-25 DIAGNOSIS — Z23 Encounter for immunization: Secondary | ICD-10-CM

## 2023-03-25 DIAGNOSIS — W1830XA Fall on same level, unspecified, initial encounter: Secondary | ICD-10-CM | POA: Diagnosis present

## 2023-03-25 DIAGNOSIS — I484 Atypical atrial flutter: Secondary | ICD-10-CM | POA: Diagnosis present

## 2023-03-25 DIAGNOSIS — S42031A Displaced fracture of lateral end of right clavicle, initial encounter for closed fracture: Secondary | ICD-10-CM | POA: Diagnosis present

## 2023-03-25 DIAGNOSIS — I5023 Acute on chronic systolic (congestive) heart failure: Secondary | ICD-10-CM | POA: Diagnosis present

## 2023-03-25 DIAGNOSIS — R21 Rash and other nonspecific skin eruption: Secondary | ICD-10-CM | POA: Diagnosis not present

## 2023-03-25 DIAGNOSIS — D75839 Thrombocytosis, unspecified: Secondary | ICD-10-CM | POA: Diagnosis present

## 2023-03-25 DIAGNOSIS — Z888 Allergy status to other drugs, medicaments and biological substances status: Secondary | ICD-10-CM

## 2023-03-25 DIAGNOSIS — I255 Ischemic cardiomyopathy: Secondary | ICD-10-CM | POA: Diagnosis present

## 2023-03-25 DIAGNOSIS — Z8551 Personal history of malignant neoplasm of bladder: Secondary | ICD-10-CM

## 2023-03-25 LAB — CBC WITH DIFFERENTIAL/PLATELET
Abs Immature Granulocytes: 0.09 10*3/uL — ABNORMAL HIGH (ref 0.00–0.07)
Basophils Absolute: 0.1 10*3/uL (ref 0.0–0.1)
Basophils Relative: 0 %
Eosinophils Absolute: 0.1 10*3/uL (ref 0.0–0.5)
Eosinophils Relative: 0 %
HCT: 35.4 % — ABNORMAL LOW (ref 39.0–52.0)
Hemoglobin: 10.9 g/dL — ABNORMAL LOW (ref 13.0–17.0)
Immature Granulocytes: 1 %
Lymphocytes Relative: 5 %
Lymphs Abs: 0.8 10*3/uL (ref 0.7–4.0)
MCH: 26.3 pg (ref 26.0–34.0)
MCHC: 30.8 g/dL (ref 30.0–36.0)
MCV: 85.5 fL (ref 80.0–100.0)
Monocytes Absolute: 1.2 10*3/uL — ABNORMAL HIGH (ref 0.1–1.0)
Monocytes Relative: 9 %
Neutro Abs: 12 10*3/uL — ABNORMAL HIGH (ref 1.7–7.7)
Neutrophils Relative %: 85 %
Platelets: 555 10*3/uL — ABNORMAL HIGH (ref 150–400)
RBC: 4.14 MIL/uL — ABNORMAL LOW (ref 4.22–5.81)
RDW: 17.7 % — ABNORMAL HIGH (ref 11.5–15.5)
WBC: 14.2 10*3/uL — ABNORMAL HIGH (ref 4.0–10.5)
nRBC: 0 % (ref 0.0–0.2)

## 2023-03-25 LAB — COMPREHENSIVE METABOLIC PANEL WITH GFR
ALT: 85 U/L — ABNORMAL HIGH (ref 0–44)
AST: 74 U/L — ABNORMAL HIGH (ref 15–41)
Albumin: 2.4 g/dL — ABNORMAL LOW (ref 3.5–5.0)
Alkaline Phosphatase: 115 U/L (ref 38–126)
Anion gap: 7 (ref 5–15)
BUN: 32 mg/dL — ABNORMAL HIGH (ref 8–23)
CO2: 21 mmol/L — ABNORMAL LOW (ref 22–32)
Calcium: 8.1 mg/dL — ABNORMAL LOW (ref 8.9–10.3)
Chloride: 105 mmol/L (ref 98–111)
Creatinine, Ser: 1.96 mg/dL — ABNORMAL HIGH (ref 0.61–1.24)
GFR, Estimated: 35 mL/min — ABNORMAL LOW
Glucose, Bld: 176 mg/dL — ABNORMAL HIGH (ref 70–99)
Potassium: 3.7 mmol/L (ref 3.5–5.1)
Sodium: 133 mmol/L — ABNORMAL LOW (ref 135–145)
Total Bilirubin: 1.1 mg/dL
Total Protein: 6.1 g/dL — ABNORMAL LOW (ref 6.5–8.1)

## 2023-03-25 MED ORDER — MORPHINE SULFATE (PF) 2 MG/ML IV SOLN
2.0000 mg | Freq: Once | INTRAVENOUS | Status: AC
  Administered 2023-03-25: 2 mg via INTRAVENOUS
  Filled 2023-03-25: qty 1

## 2023-03-25 MED ORDER — MORPHINE SULFATE (PF) 4 MG/ML IV SOLN
4.0000 mg | Freq: Once | INTRAVENOUS | Status: AC
  Administered 2023-03-25: 4 mg via INTRAVENOUS
  Filled 2023-03-25: qty 1

## 2023-03-25 NOTE — ED Provider Notes (Incomplete)
[place] EMERGENCY DEPARTMENT AT [facility] Provider Note   CSN: [id number] Arrival date & time: 03/25/23  2008     History {Add pertinent medical, surgical, social history, OB history to HPI:1} Chief Complaint  Patient presents with  . Hip Pain    [name] [name] is a 77 y.o. male who presents via EMS for right hip pain.  Patient was admitted from 11/4 to 11/8 following a fall.  X-rays were taken of his right hip at that time.  Which did not demonstrate any obvious fracture or dislocation.  He states since returning home his hip pain has progressively worsened and he now has significant pain with weightbearing.  He is now utilizing a walker.  His PCP prescribed him tramadol which she did not tolerate well.  Denies any numbness or tingling in his lower extremity.    Hip Pain       Home Medications Prior to Admission medications   Medication Sig Start Date End Date Taking? Authorizing Provider  acetaminophen (TYLENOL) 500 MG tablet Take 1,000 mg by mouth every 6 (six) hours as needed for mild pain (pain score 1-3), headache or fever.    [provider]  amiodarone (PACERONE) 200 MG tablet Take 1 tablet (200 mg total) by mouth daily. 03/08/23   Ghimire, Werner Lean, MD  atorvastatin (LIPITOR) 80 MG tablet TAKE 1 TABLET BY MOUTH EVERY DAY 07/27/22   Bensimhon, Bevelyn Buckles, MD  cyanocobalamin (VITAMIN B12) 250 MCG tablet Take 750 mcg by mouth daily.    [provider]  ELIQUIS 5 MG TABS tablet TAKE 1 TABLET TWICE A DAY 10/03/22   Bensimhon, Bevelyn Buckles, MD  oxymetazoline (DRISTAN) 0.05 % nasal spray Place 1 spray into both nostrils at bedtime.    [provider]  sacubitril-valsartan (ENTRESTO) 49-51 MG Take 1 tablet by mouth 2 (two) times daily. 01/08/23   Bensimhon, Bevelyn Buckles, MD  torsemide (DEMADEX) 20 MG tablet Take 1 tablet (20 mg total) by mouth daily. 11/21/22   Andrey Farmer, PA-C  zolpidem (AMBIEN) 10 MG tablet Take 5 mg by mouth at bedtime.  12/13/22   [provider]      Allergies    Spironolactone, Empagliflozin, and Niacin and related    Review of Systems   Review of Systems  Musculoskeletal:  Positive for arthralgias.    Physical Exam Updated Vital Signs BP (!) 123/55   Pulse 63   Temp 98.1 F (36.7 C) (Oral)   Resp 17   SpO2 95%  Physical Exam Vitals and nursing note reviewed.  Constitutional:      General: He is not in acute distress.    Appearance: He is well-developed.  HENT:     Head: Normocephalic and atraumatic.  Eyes:     Conjunctiva/sclera: Conjunctivae normal.  Cardiovascular:     Rate and Rhythm: Normal rate and regular rhythm.     Heart sounds: No murmur heard. Pulmonary:     Effort: Pulmonary effort is normal. No respiratory distress.     Breath sounds: Normal breath sounds.  Abdominal:     Palpations: Abdomen is soft.     Tenderness: There is no abdominal tenderness.  Musculoskeletal:        General: No swelling.     Cervical back: Neck supple.     Comments: Patient unable to weight-bear due to pain.  He has tenderness to the inner groin and posterior lateral aspect of the hip.  He has 5 out of 5  strength strength.  Active and passive range of motion is limited due to pain.  Positive FABER, FADIR, pain with hip flexion, no erythema or overlying warmth over the joint  Skin:    General: Skin is warm and dry.     Capillary Refill: Capillary refill takes less than 2 seconds.  Neurological:     Mental Status: He is alert.  Psychiatric:        Mood and Affect: Mood normal.     ED Results / Procedures / Treatments   Labs (all labs ordered are listed, but only abnormal results are displayed) Labs Reviewed  CBC WITH DIFFERENTIAL/PLATELET - Abnormal; Notable for the following components:      Result Value   WBC 14.2 (*)    RBC 4.14 (*)    Hemoglobin 10.9 (*)    HCT 35.4 (*)    RDW 17.7 (*)    Platelets 555 (*)    Neutro Abs 12.0 (*)    Monocytes Absolute 1.2 (*)    Abs  Immature Granulocytes 0.09 (*)    All other components within normal limits  COMPREHENSIVE METABOLIC PANEL - Abnormal; Notable for the following components:   Sodium 133 (*)    CO2 21 (*)    Glucose, Bld 176 (*)    BUN 32 (*)    Creatinine, Ser 1.96 (*)    Calcium 8.1 (*)    Total Protein 6.1 (*)    Albumin 2.4 (*)    AST 74 (*)    ALT 85 (*)    GFR, Estimated 35 (*)    All other components within normal limits    EKG None  Radiology DG Hip Unilat W or Wo Pelvis 2-3 Views Right  Result Date: 03/25/2023 CLINICAL DATA:  Hip pain EXAM: DG HIP (WITH OR WITHOUT PELVIS) 2-3V RIGHT COMPARISON:  Right hip x-ray 03/05/2023 FINDINGS: The bones are mildly osteopenic. There is no acute fracture or dislocation. There are moderate degenerative changes of both hips similar to the prior study. Peripheral vascular calcifications are present. IMPRESSION: 1. No acute fracture or dislocation. 2. Moderate degenerative changes of both hips. Electronically Signed   By: Darliss Cheney M.D.   On: 03/25/2023 22:05    Procedures Procedures  {Document cardiac monitor, telemetry assessment procedure when appropriate:1}  Medications Ordered in ED Medications  morphine (PF) 2 MG/ML injection 2 mg (2 mg Intravenous Given 03/25/23 2112)  morphine (PF) 4 MG/ML injection 4 mg (4 mg Intravenous Given 03/25/23 2321)    ED Course/ Medical Decision Making/ A&P   {   Click here for ABCD2, HEART and other calculatorsREFRESH Note before signing :1}                              Medical Decision Making  This patient presents to the ED with chief complaint(s) of right hip pain with pertinent past medical history of fall 2 weeks ago.  The complaint involves an extensive differential diagnosis and also carries with it a high risk of complications and morbidity.    The differential diagnosis includes  Fracture, dislocation, septic joint, bursitis, arthritis The initial plan is to  Repeat x-ray to evaluate for occult  fracture, pain control with morphine Additional history obtained: Additional history obtained from family Records reviewed previous admission documents  Initial Assessment:   Hemodynamically stable, afebrile.  Exam is nonfocal but he does appear to be in significant discomfort, concerning for occult fracture.  Tolerates some range of motion of his hips without erythema or warmth, low suspicion for septic joint.  Independent ECG/labs interpretation:  The following labs were independently interpreted:  CBC shows leukocytosis at baseline downtrending CMP without significant abnormality  Independent visualization and interpretation of imaging: I independently visualized the following imaging with scope of interpretation limited to determining acute life threatening conditions related to emergency care: Right hip x-ray, which revealed no acute abnormality, moderate degenerative changes  Discussed these findings with the patient and his wife.  They are uncomfortable with him going home as he is unable to weight-bear with persistent pain.  Will obtain CT right hip to evaluate for occult fracture.  Treatment and Reassessment: Patient given IV morphine upon first assessment Upon reassessments he did have some minor temporary improvement of pain but by reassessment 11 PM he was still uncomfortable and pain.  Repeat morphine given.  Consultations obtained:   None  Disposition:   Sign out given. Patient will be discharged home pending CT results close Ortho follow-up.   Social Determinants of Health:   None   {Document critical care time when appropriate:1} {Document review of labs and clinical decision tools ie heart score, Chads2Vasc2 etc:1}  {Document your independent review of radiology images, and any outside records:1} {Document your discussion with family members, caretakers, and with consultants:1} {Document social determinants of health affecting pt's care:1} {Document your decision  making why or why not admission, treatments were needed:1} Final Clinical Impression(s) / ED Diagnoses Final diagnoses:  Right hip pain    Rx / DC Orders ED Discharge Orders          Ordered    CT HIP RIGHT WO CONTRAST        03/25/23 2237

## 2023-03-25 NOTE — Progress Notes (Signed)
EPIC Encounter for ICM Monitoring  Patient Name: [name] [name] is a 77 y.o. male Date: 03/25/2023 Primary Care Physican: [name], MD Primary Cardiologist: Benismhon Electrophysiologist: Graciela Husbands 01/08/2023 Office Weight: 212 lbs  02/20/2023 Weight: 203 lbs 03/24/2024 Weight: 207 lbs                                                           Spoke with patient and heart failure questions reviewed.  Transmission results reviewed.  Pt reports weight gain of 4 lbs over the last couple of weeks.  His main problem is severe hip pain and mobility is extremely limited.  He plans on going to ER to have pain evaluated.  He will ask about getting Lasix while he is in ER to help with fluid retention.   Optivol thoracic impedance suggesting possible fluid accumulation progressively worsening since 10/24.   CCM Implant 9/6.   Prescribed:  Torsemide 20 mg take 1 tablet(s) (20 mg total) by mouth daily.   02/20/2023 Pt self adjust Torsemide when needed.     Labs: 01/16/2023 Creatinine 2.18, BUN 29, Potassium 4.2, Sodium 138, GFR 30  12/11/2022 Creatinine 2.02, BUN 26, Potassium 4.3, Sodium 138, GFR 33  11/27/2022 Creatinine 2.12, BUN 31, Potassium 4.3, Sodium 135, GFR 31 11/13/2022 Creatinine 2.60, BUN 39, Potassium 4.6, Sodium 138, GFR 25 A complete set of results can be found in Results Review.   Recommendations:  Pt plans on going to ED on 11/26 for severe pain and will address fluid retention.      Follow-up plan: ICM clinic phone appointment on 04/08/2023 to recheck fluid levels.   91 day device clinic remote transmission 06/04/2023.     EP/Cardiology Office Visits:  04/10/2023 with Dr. Graciela Husbands.  04/19/2023 Dr Gala Romney 3 month f/u.     Copy of ICM check sent to Dr. Graciela Husbands.   3 month ICM trend: 03/25/2023.    12-14 Month ICM trend:     [name], RN 03/25/2023 4:15 PM

## 2023-03-25 NOTE — ED Triage Notes (Addendum)
Patient fell two weeks ago and was checked out here. He says other than a shoulder injury he was sent home. He has right hip pain, bruising, and swelling. He was taking tramadol for pain but stopped because of the side effects. He states the pain has been getting progressively worse. O2 has been running a bit low with EMS. He doesn't usually wear O2 at home. He states he feels a little short of breath. Here on room air he is 97% for EMS he was in the high 80s.

## 2023-03-25 NOTE — ED Provider Notes (Signed)
[place] EMERGENCY DEPARTMENT AT [facility] Provider Note   CSN: [id number] Arrival date & time: 03/25/23  2008     History  Chief Complaint  Patient presents with   Hip Pain    [name] [name] is a 77 y.o. male who presents via EMS for right hip pain.  Patient was admitted from 11/4 to 11/8 following a fall.  X-rays were taken of his right hip at that time.  Which did not demonstrate any obvious fracture or dislocation.  He states since returning home his hip pain has progressively worsened and he now has significant pain with weightbearing.  He is now utilizing a walker.  His PCP prescribed him tramadol which she did not tolerate well.  Denies any numbness or tingling in his lower extremity.    Hip Pain       Home Medications Prior to Admission medications   Medication Sig Start Date End Date Taking? Authorizing Provider  acetaminophen (TYLENOL) 500 MG tablet Take 1,000 mg by mouth every 6 (six) hours as needed for mild pain (pain score 1-3), headache or fever.    [provider]  amiodarone (PACERONE) 200 MG tablet Take 1 tablet (200 mg total) by mouth daily. 03/08/23   Ghimire, Werner Lean, MD  atorvastatin (LIPITOR) 80 MG tablet TAKE 1 TABLET BY MOUTH EVERY DAY 07/27/22   Bensimhon, Bevelyn Buckles, MD  cyanocobalamin (VITAMIN B12) 250 MCG tablet Take 750 mcg by mouth daily.    [provider]  ELIQUIS 5 MG TABS tablet TAKE 1 TABLET TWICE A DAY 10/03/22   Bensimhon, Bevelyn Buckles, MD  oxymetazoline (DRISTAN) 0.05 % nasal spray Place 1 spray into both nostrils at bedtime.    [provider]  sacubitril-valsartan (ENTRESTO) 49-51 MG Take 1 tablet by mouth 2 (two) times daily. 01/08/23   Bensimhon, Bevelyn Buckles, MD  torsemide (DEMADEX) 20 MG tablet Take 1 tablet (20 mg total) by mouth daily. 11/21/22   Andrey Farmer, PA-C  zolpidem (AMBIEN) 10 MG tablet Take 5 mg by mouth at bedtime. 12/13/22   [provider]      Allergies     Spironolactone, Empagliflozin, and Niacin and related    Review of Systems   Review of Systems  Musculoskeletal:  Positive for arthralgias.    Physical Exam Updated Vital Signs BP (!) 123/55   Pulse 63   Temp 98.1 F (36.7 C) (Oral)   Resp 17   SpO2 95%  Physical Exam Vitals and nursing note reviewed.  Constitutional:      General: He is not in acute distress.    Appearance: He is well-developed.  HENT:     Head: Normocephalic and atraumatic.  Eyes:     Conjunctiva/sclera: Conjunctivae normal.  Cardiovascular:     Rate and Rhythm: Normal rate and regular rhythm.     Heart sounds: No murmur heard. Pulmonary:     Effort: Pulmonary effort is normal. No respiratory distress.     Breath sounds: Normal breath sounds.  Abdominal:     Palpations: Abdomen is soft.     Tenderness: There is no abdominal tenderness.  Musculoskeletal:        General: No swelling.     Cervical back: Neck supple.     Comments: Patient unable to weight-bear due to pain.  He has tenderness to the inner groin and posterior lateral aspect of the hip.  He has 5 out of 5 strength strength.  Active and passive range of motion  is limited due to pain.  Positive FABER, FADIR, pain with hip flexion, no erythema or overlying warmth over the joint  Skin:    General: Skin is warm and dry.     Capillary Refill: Capillary refill takes less than 2 seconds.  Neurological:     Mental Status: He is alert.  Psychiatric:        Mood and Affect: Mood normal.     ED Results / Procedures / Treatments   Labs (all labs ordered are listed, but only abnormal results are displayed) Labs Reviewed  CBC WITH DIFFERENTIAL/PLATELET - Abnormal; Notable for the following components:      Result Value   WBC 14.2 (*)    RBC 4.14 (*)    Hemoglobin 10.9 (*)    HCT 35.4 (*)    RDW 17.7 (*)    Platelets 555 (*)    Neutro Abs 12.0 (*)    Monocytes Absolute 1.2 (*)    Abs Immature Granulocytes 0.09 (*)    All other components  within normal limits  COMPREHENSIVE METABOLIC PANEL - Abnormal; Notable for the following components:   Sodium 133 (*)    CO2 21 (*)    Glucose, Bld 176 (*)    BUN 32 (*)    Creatinine, Ser 1.96 (*)    Calcium 8.1 (*)    Total Protein 6.1 (*)    Albumin 2.4 (*)    AST 74 (*)    ALT 85 (*)    GFR, Estimated 35 (*)    All other components within normal limits    EKG None  Radiology DG Hip Unilat W or Wo Pelvis 2-3 Views Right  Result Date: 03/25/2023 CLINICAL DATA:  Hip pain EXAM: DG HIP (WITH OR WITHOUT PELVIS) 2-3V RIGHT COMPARISON:  Right hip x-ray 03/05/2023 FINDINGS: The bones are mildly osteopenic. There is no acute fracture or dislocation. There are moderate degenerative changes of both hips similar to the prior study. Peripheral vascular calcifications are present. IMPRESSION: 1. No acute fracture or dislocation. 2. Moderate degenerative changes of both hips. Electronically Signed   By: Darliss Cheney M.D.   On: 03/25/2023 22:05    Procedures Procedures    Medications Ordered in ED Medications  morphine (PF) 2 MG/ML injection 2 mg (2 mg Intravenous Given 03/25/23 2112)  morphine (PF) 4 MG/ML injection 4 mg (4 mg Intravenous Given 03/25/23 2321)    ED Course/ Medical Decision Making/ A&P                                 Medical Decision Making Amount and/or Complexity of Data Reviewed Labs: ordered. Radiology: ordered.  Risk Prescription drug management.   This patient presents to the ED with chief complaint(s) of right hip pain with pertinent past medical history of fall 2 weeks ago.  The complaint involves an extensive differential diagnosis and also carries with it a high risk of complications and morbidity.    The differential diagnosis includes  Fracture, dislocation, septic joint, bursitis, arthritis The initial plan is to  Repeat x-ray to evaluate for occult fracture, pain control with morphine Additional history obtained: Additional history obtained  from family Records reviewed previous admission documents  Initial Assessment:   Hemodynamically stable, afebrile.  Exam is nonfocal but he does appear to be in significant discomfort, concerning for occult fracture.  Tolerates some range of motion of his hips without erythema or warmth, low suspicion  for septic joint.  Independent ECG/labs interpretation:  The following labs were independently interpreted:  CBC shows leukocytosis at baseline downtrending CMP without significant abnormality  Independent visualization and interpretation of imaging: I independently visualized the following imaging with scope of interpretation limited to determining acute life threatening conditions related to emergency care: Right hip x-ray, which revealed no acute abnormality, moderate degenerative changes  Discussed these findings with the patient and his wife.  They are uncomfortable with him going home as he is unable to weight-bear with persistent pain.  Will obtain CT right hip to evaluate for occult fracture.  Treatment and Reassessment: Patient given IV morphine upon first assessment Upon reassessments he did have some minor temporary improvement of pain but by reassessment 11 PM he was still uncomfortable and pain.  Repeat morphine given.  Consultations obtained:   None  Disposition:   Sign out given. Patient will be discharged home pending CT results close Ortho follow-up.   Social Determinants of Health:   None         Final Clinical Impression(s) / ED Diagnoses Final diagnoses:  Right hip pain    Rx / DC Orders ED Discharge Orders          Ordered    CT HIP RIGHT WO CONTRAST        03/25/23 2237              Halford Decamp, PA-C 03/26/23 0051    Wilkie Aye, Clabe Seal, DO 04/01/23 1659

## 2023-03-25 NOTE — Discharge Instructions (Addendum)
Advised to follow up PCP in one week. Patient is being discharged to Acute inpatient rehab. Advised to follow up Left leg venous duplex for DVT.

## 2023-03-26 ENCOUNTER — Emergency Department (HOSPITAL_COMMUNITY): Payer: Medicare Other

## 2023-03-26 ENCOUNTER — Inpatient Hospital Stay (HOSPITAL_COMMUNITY): Payer: Medicare Other

## 2023-03-26 ENCOUNTER — Encounter (HOSPITAL_COMMUNITY): Payer: Self-pay | Admitting: Family Medicine

## 2023-03-26 DIAGNOSIS — E1165 Type 2 diabetes mellitus with hyperglycemia: Secondary | ICD-10-CM | POA: Diagnosis present

## 2023-03-26 DIAGNOSIS — K683 Retroperitoneal hematoma: Secondary | ICD-10-CM | POA: Diagnosis present

## 2023-03-26 DIAGNOSIS — D72829 Elevated white blood cell count, unspecified: Secondary | ICD-10-CM | POA: Diagnosis not present

## 2023-03-26 DIAGNOSIS — I959 Hypotension, unspecified: Secondary | ICD-10-CM | POA: Diagnosis not present

## 2023-03-26 DIAGNOSIS — N1832 Chronic kidney disease, stage 3b: Secondary | ICD-10-CM | POA: Diagnosis present

## 2023-03-26 DIAGNOSIS — I5043 Acute on chronic combined systolic (congestive) and diastolic (congestive) heart failure: Secondary | ICD-10-CM | POA: Diagnosis not present

## 2023-03-26 DIAGNOSIS — W1830XA Fall on same level, unspecified, initial encounter: Secondary | ICD-10-CM | POA: Diagnosis present

## 2023-03-26 DIAGNOSIS — I255 Ischemic cardiomyopathy: Secondary | ICD-10-CM | POA: Diagnosis present

## 2023-03-26 DIAGNOSIS — E860 Dehydration: Secondary | ICD-10-CM | POA: Diagnosis present

## 2023-03-26 DIAGNOSIS — I484 Atypical atrial flutter: Secondary | ICD-10-CM | POA: Diagnosis present

## 2023-03-26 DIAGNOSIS — T380X5A Adverse effect of glucocorticoids and synthetic analogues, initial encounter: Secondary | ICD-10-CM | POA: Diagnosis present

## 2023-03-26 DIAGNOSIS — D649 Anemia, unspecified: Secondary | ICD-10-CM | POA: Diagnosis not present

## 2023-03-26 DIAGNOSIS — G4700 Insomnia, unspecified: Secondary | ICD-10-CM | POA: Diagnosis not present

## 2023-03-26 DIAGNOSIS — I509 Heart failure, unspecified: Secondary | ICD-10-CM | POA: Diagnosis not present

## 2023-03-26 DIAGNOSIS — M7989 Other specified soft tissue disorders: Secondary | ICD-10-CM

## 2023-03-26 DIAGNOSIS — R001 Bradycardia, unspecified: Secondary | ICD-10-CM | POA: Diagnosis present

## 2023-03-26 DIAGNOSIS — J189 Pneumonia, unspecified organism: Secondary | ICD-10-CM | POA: Diagnosis present

## 2023-03-26 DIAGNOSIS — I252 Old myocardial infarction: Secondary | ICD-10-CM | POA: Diagnosis not present

## 2023-03-26 DIAGNOSIS — R911 Solitary pulmonary nodule: Secondary | ICD-10-CM | POA: Diagnosis present

## 2023-03-26 DIAGNOSIS — I10 Essential (primary) hypertension: Secondary | ICD-10-CM | POA: Diagnosis not present

## 2023-03-26 DIAGNOSIS — K6812 Psoas muscle abscess: Secondary | ICD-10-CM | POA: Diagnosis not present

## 2023-03-26 DIAGNOSIS — D631 Anemia in chronic kidney disease: Secondary | ICD-10-CM | POA: Diagnosis present

## 2023-03-26 DIAGNOSIS — E11621 Type 2 diabetes mellitus with foot ulcer: Secondary | ICD-10-CM | POA: Diagnosis present

## 2023-03-26 DIAGNOSIS — L89610 Pressure ulcer of right heel, unstageable: Secondary | ICD-10-CM | POA: Diagnosis not present

## 2023-03-26 DIAGNOSIS — M25551 Pain in right hip: Secondary | ICD-10-CM | POA: Diagnosis present

## 2023-03-26 DIAGNOSIS — J440 Chronic obstructive pulmonary disease with acute lower respiratory infection: Secondary | ICD-10-CM | POA: Diagnosis present

## 2023-03-26 DIAGNOSIS — L89150 Pressure ulcer of sacral region, unstageable: Secondary | ICD-10-CM | POA: Diagnosis not present

## 2023-03-26 DIAGNOSIS — N179 Acute kidney failure, unspecified: Secondary | ICD-10-CM | POA: Diagnosis not present

## 2023-03-26 DIAGNOSIS — E1151 Type 2 diabetes mellitus with diabetic peripheral angiopathy without gangrene: Secondary | ICD-10-CM | POA: Diagnosis present

## 2023-03-26 DIAGNOSIS — G5711 Meralgia paresthetica, right lower limb: Secondary | ICD-10-CM | POA: Diagnosis present

## 2023-03-26 DIAGNOSIS — S42031A Displaced fracture of lateral end of right clavicle, initial encounter for closed fracture: Secondary | ICD-10-CM | POA: Diagnosis present

## 2023-03-26 DIAGNOSIS — Z1152 Encounter for screening for COVID-19: Secondary | ICD-10-CM | POA: Diagnosis not present

## 2023-03-26 DIAGNOSIS — J449 Chronic obstructive pulmonary disease, unspecified: Secondary | ICD-10-CM | POA: Diagnosis not present

## 2023-03-26 DIAGNOSIS — I5084 End stage heart failure: Secondary | ICD-10-CM | POA: Diagnosis present

## 2023-03-26 DIAGNOSIS — W19XXXD Unspecified fall, subsequent encounter: Secondary | ICD-10-CM | POA: Diagnosis present

## 2023-03-26 DIAGNOSIS — I5023 Acute on chronic systolic (congestive) heart failure: Secondary | ICD-10-CM | POA: Diagnosis present

## 2023-03-26 DIAGNOSIS — I130 Hypertensive heart and chronic kidney disease with heart failure and stage 1 through stage 4 chronic kidney disease, or unspecified chronic kidney disease: Secondary | ICD-10-CM | POA: Diagnosis present

## 2023-03-26 DIAGNOSIS — K761 Chronic passive congestion of liver: Secondary | ICD-10-CM | POA: Diagnosis present

## 2023-03-26 DIAGNOSIS — I4892 Unspecified atrial flutter: Secondary | ICD-10-CM | POA: Diagnosis not present

## 2023-03-26 DIAGNOSIS — Z23 Encounter for immunization: Secondary | ICD-10-CM | POA: Diagnosis not present

## 2023-03-26 DIAGNOSIS — E1122 Type 2 diabetes mellitus with diabetic chronic kidney disease: Secondary | ICD-10-CM | POA: Diagnosis present

## 2023-03-26 DIAGNOSIS — L89512 Pressure ulcer of right ankle, stage 2: Secondary | ICD-10-CM | POA: Diagnosis not present

## 2023-03-26 DIAGNOSIS — I480 Paroxysmal atrial fibrillation: Secondary | ICD-10-CM | POA: Diagnosis present

## 2023-03-26 DIAGNOSIS — M79651 Pain in right thigh: Secondary | ICD-10-CM | POA: Diagnosis not present

## 2023-03-26 DIAGNOSIS — E871 Hypo-osmolality and hyponatremia: Secondary | ICD-10-CM | POA: Diagnosis present

## 2023-03-26 DIAGNOSIS — R5381 Other malaise: Secondary | ICD-10-CM | POA: Diagnosis not present

## 2023-03-26 LAB — RESPIRATORY PANEL BY PCR

## 2023-03-26 LAB — SARS CORONAVIRUS 2 BY RT PCR: SARS Coronavirus 2 by RT PCR: NEGATIVE

## 2023-03-26 LAB — RAPID URINE DRUG SCREEN, HOSP PERFORMED
Amphetamines: NOT DETECTED
Barbiturates: NOT DETECTED
Benzodiazepines: NOT DETECTED
Cocaine: NOT DETECTED
Opiates: POSITIVE — AB
Tetrahydrocannabinol: NOT DETECTED

## 2023-03-26 LAB — GLUCOSE, CAPILLARY
Glucose-Capillary: 133 mg/dL — ABNORMAL HIGH (ref 70–99)
Glucose-Capillary: 173 mg/dL — ABNORMAL HIGH (ref 70–99)
Glucose-Capillary: 120 mg/dL — ABNORMAL HIGH (ref 70–99)
Glucose-Capillary: 171 mg/dL — ABNORMAL HIGH (ref 70–99)

## 2023-03-26 LAB — MAGNESIUM: Magnesium: 2.1 mg/dL (ref 1.7–2.4)

## 2023-03-26 LAB — HEPATITIS PANEL, ACUTE
HCV Ab: NONREACTIVE
Hep A IgM: NONREACTIVE
Hep B C IgM: NONREACTIVE
Hepatitis B Surface Ag: NONREACTIVE

## 2023-03-26 LAB — PROTIME-INR
INR: 2.5 — ABNORMAL HIGH (ref 0.8–1.2)
Prothrombin Time: 27 s — ABNORMAL HIGH (ref 11.4–15.2)

## 2023-03-26 LAB — ACETAMINOPHEN LEVEL: Acetaminophen (Tylenol), Serum: <10 ug/mL — ABNORMAL LOW (ref 10–30)

## 2023-03-26 MED ORDER — AZITHROMYCIN 250 MG PO TABS
500.0000 mg | ORAL_TABLET | Freq: Once | ORAL | Status: AC
Start: 2023-03-26 — End: 2023-03-26
  Administered 2023-03-26: 500 mg via ORAL
  Filled 2023-03-26: qty 2

## 2023-03-26 MED ORDER — SODIUM CHLORIDE 0.9% FLUSH
3.0000 mL | Freq: Two times a day (BID) | INTRAVENOUS | Status: DC
  Administered 2023-03-26 – 2023-04-08 (×20): 3 mL via INTRAVENOUS

## 2023-03-26 MED ORDER — INSULIN ASPART 100 UNIT/ML IJ SOLN
0.0000 [IU] | Freq: Three times a day (TID) | INTRAMUSCULAR | Status: DC
  Administered 2023-03-26 – 2023-04-02 (×6): 1 [IU] via SUBCUTANEOUS
  Administered 2023-04-02: 3 [IU] via SUBCUTANEOUS
  Administered 2023-04-03 (×3): 1 [IU] via SUBCUTANEOUS
  Administered 2023-04-04: 2 [IU] via SUBCUTANEOUS
  Administered 2023-04-04: 3 [IU] via SUBCUTANEOUS

## 2023-03-26 MED ORDER — AMIODARONE HCL 200 MG PO TABS
200.0000 mg | ORAL_TABLET | Freq: Every day | ORAL | Status: DC
  Administered 2023-03-27 – 2023-04-08 (×12): 200 mg via ORAL
  Filled 2023-03-26 (×14): qty 1

## 2023-03-26 MED ORDER — OXYMETAZOLINE HCL 0.05 % NA SOLN
1.0000 | Freq: Every day | NASAL | Status: AC
  Administered 2023-03-27 – 2023-03-28 (×2): 1 via NASAL
  Filled 2023-03-26: qty 30

## 2023-03-26 MED ORDER — SODIUM CHLORIDE 0.9 % IV SOLN
500.0000 mg | INTRAVENOUS | Status: DC
  Administered 2023-03-27 – 2023-03-28 (×2): 500 mg via INTRAVENOUS
  Filled 2023-03-26 (×2): qty 5

## 2023-03-26 MED ORDER — SODIUM CHLORIDE 0.9 % IV SOLN
1.0000 g | INTRAVENOUS | Status: AC
  Administered 2023-03-27 – 2023-03-30 (×4): 1 g via INTRAVENOUS
  Filled 2023-03-26 (×4): qty 10

## 2023-03-26 MED ORDER — TORSEMIDE 20 MG PO TABS
20.0000 mg | ORAL_TABLET | Freq: Every day | ORAL | Status: DC
  Administered 2023-03-27: 20 mg via ORAL
  Filled 2023-03-26: qty 1

## 2023-03-26 MED ORDER — INSULIN ASPART 100 UNIT/ML IJ SOLN
0.0000 [IU] | Freq: Every day | INTRAMUSCULAR | Status: DC
  Administered 2023-04-01 – 2023-04-03 (×3): 2 [IU] via SUBCUTANEOUS
  Administered 2023-04-04: 3 [IU] via SUBCUTANEOUS
  Administered 2023-04-05 – 2023-04-06 (×2): 2 [IU] via SUBCUTANEOUS

## 2023-03-26 MED ORDER — GUAIFENESIN ER 600 MG PO TB12
600.0000 mg | ORAL_TABLET | Freq: Two times a day (BID) | ORAL | Status: DC
  Administered 2023-03-26 – 2023-04-03 (×17): 600 mg via ORAL
  Filled 2023-03-26 (×17): qty 1

## 2023-03-26 MED ORDER — IOHEXOL 350 MG/ML SOLN
80.0000 mL | Freq: Once | INTRAVENOUS | Status: AC | PRN
  Administered 2023-03-26: 80 mL via INTRAVENOUS

## 2023-03-26 MED ORDER — MORPHINE SULFATE (PF) 2 MG/ML IV SOLN
2.0000 mg | Freq: Once | INTRAVENOUS | Status: AC
  Administered 2023-03-26: 2 mg via INTRAVENOUS
  Filled 2023-03-26: qty 1

## 2023-03-26 MED ORDER — SODIUM CHLORIDE 0.9 % IV SOLN
1.0000 g | Freq: Once | INTRAVENOUS | Status: AC
  Administered 2023-03-26: 1 g via INTRAVENOUS
  Filled 2023-03-26: qty 10

## 2023-03-26 MED ORDER — ACETAMINOPHEN 325 MG PO TABS: 650.0000 mg | ORAL_TABLET | Freq: Four times a day (QID) | ORAL | Status: DC | PRN

## 2023-03-26 MED ORDER — ONDANSETRON HCL 4 MG/2ML IJ SOLN: 4.0000 mg | Freq: Four times a day (QID) | INTRAMUSCULAR | Status: DC | PRN

## 2023-03-26 MED ORDER — ZOLPIDEM TARTRATE 5 MG PO TABS
10.0000 mg | ORAL_TABLET | Freq: Once | ORAL | Status: AC
  Administered 2023-03-26: 10 mg via ORAL
  Filled 2023-03-26 (×2): qty 2

## 2023-03-26 MED ORDER — SODIUM CHLORIDE 0.9% FLUSH: 3.0000 mL | INTRAVENOUS | Status: DC | PRN

## 2023-03-26 MED ORDER — OXYCODONE HCL 5 MG PO TABS
5.0000 mg | ORAL_TABLET | ORAL | Status: DC | PRN
  Administered 2023-03-26 – 2023-04-08 (×32): 5 mg via ORAL
  Filled 2023-03-26 (×34): qty 1

## 2023-03-26 MED ORDER — MELATONIN 5 MG PO TABS
5.0000 mg | ORAL_TABLET | Freq: Every evening | ORAL | Status: DC | PRN
  Administered 2023-03-27 – 2023-04-07 (×6): 5 mg via ORAL
  Filled 2023-03-26 (×7): qty 1

## 2023-03-26 MED ORDER — ALBUTEROL SULFATE (2.5 MG/3ML) 0.083% IN NEBU: 2.5000 mg | INHALATION_SOLUTION | RESPIRATORY_TRACT | Status: DC | PRN

## 2023-03-26 MED ORDER — SODIUM CHLORIDE 0.9 % IV SOLN
250.0000 mL | INTRAVENOUS | Status: AC | PRN
  Administered 2023-03-27: 250 mL via INTRAVENOUS

## 2023-03-26 MED ORDER — BENZONATATE 100 MG PO CAPS: 200.0000 mg | ORAL_CAPSULE | Freq: Three times a day (TID) | ORAL | Status: DC | PRN

## 2023-03-26 MED ORDER — HYDROMORPHONE HCL 1 MG/ML IJ SOLN
0.5000 mg | INTRAMUSCULAR | Status: DC | PRN
  Administered 2023-03-28 – 2023-04-08 (×4): 0.5 mg via INTRAVENOUS
  Filled 2023-03-26 (×6): qty 0.5

## 2023-03-26 MED ORDER — ONDANSETRON HCL 4 MG PO TABS: 4.0000 mg | ORAL_TABLET | Freq: Four times a day (QID) | ORAL | Status: DC | PRN

## 2023-03-26 MED ORDER — POLYETHYLENE GLYCOL 3350 17 G PO PACK
17.0000 g | PACK | Freq: Every day | ORAL | Status: DC | PRN
  Administered 2023-03-29: 17 g via ORAL
  Filled 2023-03-26: qty 1

## 2023-03-26 MED ORDER — SACUBITRIL-VALSARTAN 49-51 MG PO TABS: 1.0000 | ORAL_TABLET | Freq: Two times a day (BID) | ORAL | Status: DC

## 2023-03-26 MED ORDER — ACETAMINOPHEN 650 MG RE SUPP: 650.0000 mg | Freq: Four times a day (QID) | RECTAL | Status: DC | PRN

## 2023-03-26 NOTE — H&P (Addendum)
History and Physical    Patient: [name] [name] DOB: November 06, 1945 DOA: 03/25/2023 DOS: the patient was seen and examined on 03/26/2023 PCP: [name], MD  Patient coming from: Home  Chief Complaint:  Chief Complaint  Patient presents with   Hip Pain   HPI: [name] [name] is a 77 y.o. male with medical history significant of  hypertension, coronary artery disease, paroxysmal atrial fibrillation/flutter, chronic systolic CHF,s/p CCM/PM, PVD, diabetes mellitus type 2, bladder cancer, and CKD.  Of note he was admitted 03/05/23 -03/08/23 after a fall at home thought secondary to syncope and hypotension where he suffered a right distal clavicle fracture that was managed non-operatively.  At discharge outpatient Torsemide, Entresto, and amiodarone doses were adjusted.  He returns to [facility] ED yesterday night with progressively worsening (R) hip pain for the last 2 days, he now has significant pain with weightbearing and is unable to ambulate independently.  ED Course: On arrival to [facility] ED he was noted to be afebrile temp 36.7C, BP 117/52, HR 66, RR 20, SpO2 96% on room air.  Labs notable for leukocytosis WBC 14.2, creatinine 1.96, hemoglobin 10.9, platelets 555.  AST and ALT elevated at 74 and 85 respectively.  PT 27, INR 2.5.  CT hip with evidence of retroperitoneal hematoma extending into the distal right psoas and right iliacus muscle. CT chest with multifocal groundglass opacities in bilateral apices.  He was given ceftriaxone, azithromycin, and morphine in the ED.  Patient still with significant pain with weightbearing requiring 2 person assist.  TRH contacted for admission.  Review of Systems: As mentioned in the history of present illness. All other systems reviewed and are negative. Past Medical History:  Diagnosis Date   AICD (automatic cardioverter/defibrillator) present    Arthritis    Bladder cancer (HCC)    Borderline glaucoma    Chronic systolic  (congestive) heart failure (HCC)    Coronary artery disease    Diabetes mellitus type 2, controlled (HCC) ORAL MED   Essential hypertension    Heart murmur    Hepatitis    Medical history non-contributory    Paroxysmal A-fib (HCC)    Paroxysmal A-fib (HCC) 08/20/2020   Peripheral vascular disease (HCC)    Pneumonia    Past Surgical History:  Procedure Laterality Date   CARDIOVERSION N/A 08/10/2021   Procedure: CARDIOVERSION;  Surgeon: Dolores Patty, MD;  Location: [facility] ENDOSCOPY;  Service: Cardiovascular;  Laterality: N/A;   CARDIOVERSION N/A 11/09/2022   Procedure: CARDIOVERSION;  Surgeon: Maisie Fus, MD;  Location: [facility] INVASIVE CV LAB;  Service: Cardiovascular;  Laterality: N/A;   CARDIOVERSION N/A 03/07/2023   Procedure: CARDIOVERSION (CATH LAB);  Surgeon: Tessa Lerner, DO;  Location: [facility] INVASIVE CV LAB;  Service: Cardiovascular;  Laterality: N/A;   CAROTID DUPLEX SCAN  07-26-2010   BILATERAL ICA  STENOSIS 1% - 39%   CCM GENERATOR INSERTION N/A 01/04/2023   Procedure: CCM GENERATOR INSERTION;  Surgeon: Duke Salvia, MD;  Location: [facility] INVASIVE CV LAB;  Service: Cardiovascular;  Laterality: N/A;   CHOLECYSTECTOMY N/A 08/22/2020   Procedure: LAPAROSCOPIC CHOLECYSTECTOMY;  Surgeon: Emelia Loron, MD;  Location: [facility] OR;  Service: General;  Laterality: N/A;   CORONARY ARTERY BYPASS GRAFT N/A 04/08/2017   Procedure: CORONARY ARTERY BYPASS GRAFTING (CABG) TIMES TWO USING LEFT INTERNAL MAMMARY ARTERY AND LEFT SAPHENOUS LEG VEIN HARVESTED ENDOSCOPICALLY.  LEG VEIN ALSO HARVESTED FROM THE RIGHT LEG;  Surgeon: Alleen Borne, MD;  Location: [facility] OR;  Service: Open  Heart Surgery;  Laterality: N/A;   CYSTOSCOPY WITH BIOPSY N/A 08/01/2012   Procedure: CYSTOSCOPY WITH BIOPSY BLADDER BIOPSY   ;  Surgeon: Antony Haste, MD;  Location: [facility];  Service: Urology;  Laterality: N/A;   ERCP N/A 08/21/2020   Procedure: ENDOSCOPIC RETROGRADE CHOLANGIOPANCREATOGRAPHY (ERCP);   Surgeon: Jeani Hawking, MD;  Location: [facility] ENDOSCOPY;  Service: Endoscopy;  Laterality: N/A;   FULGURATION OF BLADDER TUMOR N/A 08/01/2012   Procedure: FULGURATION OF BLADDER TUMOR;  Surgeon: Antony Haste, MD;  Location: [facility];  Service: Urology;  Laterality: N/A;   ICD IMPLANT  11/20/2017   ICD IMPLANT N/A 11/20/2017   Procedure: ICD IMPLANT;  Surgeon: Duke Salvia, MD;  Location: [facility] INVASIVE CV LAB;  Service: Cardiovascular;  Laterality: N/A;   LEFT HEART CATH AND CORONARY ANGIOGRAPHY N/A 03/31/2017   Procedure: LEFT HEART CATH AND CORONARY ANGIOGRAPHY;  Surgeon: Marykay Lex, MD;  Location: [facility] INVASIVE CV LAB;  Service: Cardiovascular;  Laterality: N/A;   LUMBAR LAMINECTOMY/DECOMPRESSION MICRODISCECTOMY Bilateral 06/16/2019   Procedure: Bilateral Lumbar Four-Five Laminectomy and Foraminotomy;  Surgeon: Julio Sicks, MD;  Location: [facility] OR;  Service: Neurosurgery;  Laterality: Bilateral;  posterior   REMOVAL OF STONES  08/21/2020   Procedure: REMOVAL OF STONES;  Surgeon: Jeani Hawking, MD;  Location: [facility] ENDOSCOPY;  Service: Endoscopy;;   RIGHT HEART CATH Right 03/31/2017   Procedure: RIGHT HEART CATH;  Surgeon: Marykay Lex, MD;  Location: [facility] INVASIVE CV LAB;  Service: Cardiovascular;  Laterality: Right;   SPHINCTEROTOMY  08/21/2020   Procedure: SPHINCTEROTOMY;  Surgeon: Jeani Hawking, MD;  Location: [facility] ENDOSCOPY;  Service: Endoscopy;;   TEE WITHOUT CARDIOVERSION N/A 04/08/2017   Procedure: TRANSESOPHAGEAL ECHOCARDIOGRAM (TEE);  Surgeon: Alleen Borne, MD;  Location: [facility] OR;  Service: Open Heart Surgery;  Laterality: N/A;   TRANSESOPHAGEAL ECHOCARDIOGRAM (CATH LAB) N/A 03/07/2023   Procedure: TRANSESOPHAGEAL ECHOCARDIOGRAM;  Surgeon: Tessa Lerner, DO;  Location: [facility] INVASIVE CV LAB;  Service: Cardiovascular;  Laterality: N/A;   TRANSURETHRAL RESECTION OF BLADDER TUMOR  05/29/2011   Procedure: TRANSURETHRAL RESECTION OF BLADDER TUMOR (TURBT);  Surgeon: Antony Haste, MD;  Location: [facility];  Service: Urology;  Laterality: N/A;   VENTRICULAR ASSIST DEVICE INSERTION Right 03/31/2017   Procedure: VENTRICULAR ASSIST DEVICE INSERTION;  Surgeon: Marykay Lex, MD;  Location: [facility] INVASIVE CV LAB;  Service: Cardiovascular;  Laterality: Right;   Social History:  reports that he quit smoking about 5 years ago. His smoking use included cigarettes. He started smoking about 46 years ago. He has a 40 pack-year smoking history. He has never used smokeless tobacco. He reports that he does not currently use alcohol. He reports that he does not use drugs.  Allergies  Allergen Reactions   Spironolactone Other (See Comments)    Painful gynecomastia   Semaglutide     Nausea and bloating   Tramadol Other (See Comments)    Sleep disturbance.  Apnea spells.   Empagliflozin Rash    Rash at the groin Jardiance   Niacin And Related Hives    Family History  Problem Relation Age of Onset   Heart attack Sister    Heart disease Sister     Prior to Admission medications   Medication Sig Start Date End Date Taking? Authorizing Provider  acetaminophen (TYLENOL) 500 MG tablet Take 1,000 mg by mouth every 6 (six) hours as needed for mild pain (pain score 1-3), headache or fever.   Yes [provider]  amiodarone (PACERONE) 200 MG tablet Take 1 tablet (200 mg total) by mouth daily. Patient taking differently: Take 200 mg by mouth 2 (two) times daily. 03/08/23  Yes Ghimire, Werner Lean, MD  atorvastatin (LIPITOR) 80 MG tablet TAKE 1 TABLET BY MOUTH EVERY DAY 07/27/22  Yes Bensimhon, Bevelyn Buckles, MD  cyanocobalamin (VITAMIN B12) 250 MCG tablet Take 500 mcg by mouth daily.   Yes [provider]  ELIQUIS 5 MG TABS tablet TAKE 1 TABLET TWICE A DAY Patient taking differently: Take 5 mg by mouth 2 (two) times daily. 10/03/22  Yes Bensimhon, Bevelyn Buckles, MD  oxymetazoline (DRISTAN) 0.05 % nasal spray Place 1 spray into both nostrils at bedtime.    Yes [provider]  sacubitril-valsartan (ENTRESTO) 49-51 MG Take 1 tablet by mouth 2 (two) times daily. 01/08/23  Yes Bensimhon, Bevelyn Buckles, MD  torsemide (DEMADEX) 20 MG tablet Take 1 tablet (20 mg total) by mouth daily. 11/21/22  Yes Andrey Farmer, PA-C  zolpidem (AMBIEN) 10 MG tablet Take 5 mg by mouth at bedtime. 12/13/22  Yes [provider]    Physical Exam: Vitals:   03/26/23 0545 03/26/23 0630 03/26/23 0801 03/26/23 0801  BP: (!) 123/55 (!) 112/53 (!) 121/52 (!) 121/52  Pulse: 71 64 65 65  Resp: 18 (!) 21 20 20   Temp:   97.7 F (36.5 C) 97.7 F (36.5 C)  TempSrc:   Oral Oral  SpO2: 92% 94%  (!) 89%  Height:   6\' 2"  (1.88 m)     Constitutional: NAD, calm, comfortable Eyes: PERRL, lids and conjunctivae normal ENMT: Mucous membranes are dry. Posterior pharynx clear of any exudate or lesions. Neck: normal, supple, no masses, no thyromegaly Respiratory: clear to auscultation bilaterally, no wheezing, no crackles. Normal respiratory effort. No accessory muscle use.  Cardiovascular: Regular rate and rhythm, no murmurs / rubs / gallops. +1(R)LE edema. 2+ radial and pedal pulses. No carotid bruits.  Abdomen: no tenderness, no masses palpated. No hepatosplenomegaly. Bowel sounds positive.  Musculoskeletal: no clubbing / cyanosis. No joint deformity upper and lower extremities. Limited ROM at (R) hip joint secondary to pain, no contractures.   Skin: no rashes, lesions, ulcers.  Neurologic: Alert and oriented x 3.    Data Reviewed: CBC    Component Value Date/Time   WBC 14.2 (H) 03/25/2023 2025   RBC 4.14 (L) 03/25/2023 2025   HGB 10.9 (L) 03/25/2023 2025   HGB 13.2 11/06/2022 1510   HCT 35.4 (L) 03/25/2023 2025   HCT 40.9 11/06/2022 1510   PLT 555 (H) 03/25/2023 2025   PLT 288 11/06/2022 1510   MCV 85.5 03/25/2023 2025   MCV 91 11/06/2022 1510   MCH 26.3 03/25/2023 2025   MCHC 30.8 03/25/2023 2025   RDW 17.7 (H) 03/25/2023 2025   RDW 14.5 11/06/2022  1510   LYMPHSABS 0.8 03/25/2023 2025   LYMPHSABS 2.0 11/14/2017 1025   MONOABS 1.2 (H) 03/25/2023 2025   EOSABS 0.1 03/25/2023 2025   EOSABS 0.2 11/14/2017 1025   BASOSABS 0.1 03/25/2023 2025   BASOSABS 0.0 11/14/2017 1025   CMP     Component Value Date/Time   NA 133 (L) 03/25/2023 2025   NA 139 11/06/2022 1510   K 3.7 03/25/2023 2025   CL 105 03/25/2023 2025   CO2 21 (L) 03/25/2023 2025   GLUCOSE 176 (H) 03/25/2023 2025   BUN 32 (H) 03/25/2023 2025   BUN 30 (H) 11/06/2022 1510   CREATININE 1.96 (H) 03/25/2023 2025  CALCIUM 8.1 (L) 03/25/2023 2025   PROT 6.1 (L) 03/25/2023 2025   PROT 6.4 09/25/2022 0953   ALBUMIN 2.4 (L) 03/25/2023 2025   ALBUMIN 4.0 09/25/2022 0953   AST 74 (H) 03/25/2023 2025   ALT 85 (H) 03/25/2023 2025   ALKPHOS 115 03/25/2023 2025   BILITOT 1.1 03/25/2023 2025   BILITOT 0.8 09/25/2022 0953   EGFR 29 (L) 11/06/2022 1510   GFRNONAA 35 (L) 03/25/2023 2025   Magnesium    Component Value Date/Time   MAGNESIUM 2.1 03/25/2023 2025   Protime-INR    Component Value Date/Time   PROTHROMBIN TIME INR 27.0 2.5 03/25/2023 2025 03/25/2023 2025   VAS [place] LOWER EXTREMITY VENOUS (DVT)  Result Date: 03/26/2023  Lower Venous DVT Study Patient Name:  [name] [name]  Date of Exam:   03/26/2023 Medical Rec #: [id number]         Accession #:    [id number] Date of Birth: [birth year]         Patient Gender: M Patient Age:   38 years Exam Location:  [facility] Procedure:      VAS [place] LOWER EXTREMITY VENOUS (DVT) Referring Phys: Orpha Bur FOUST --------------------------------------------------------------------------------  Indications: Swelling, Edema, and A-fib.  Comparison Study: No prior exam. Performing Technologist: Fernande Bras  Examination Guidelines: A complete evaluation includes B-mode imaging, spectral Doppler, color Doppler, and power Doppler as needed of all accessible portions of each vessel. Bilateral testing is considered an integral part of a  complete examination. Limited examinations for reoccurring indications may be performed as noted. The reflux portion of the exam is performed with the patient in reverse Trendelenburg.  +---------+---------------+---------+-----------+----------+--------------+ RIGHT    CompressibilityPhasicitySpontaneityPropertiesThrombus Aging +---------+---------------+---------+-----------+----------+--------------+ CFV      Full           Yes      Yes                                 +---------+---------------+---------+-----------+----------+--------------+ SFJ      Full                                                        +---------+---------------+---------+-----------+----------+--------------+ FV Prox  Full                                                        +---------+---------------+---------+-----------+----------+--------------+ FV Mid   Full                                                        +---------+---------------+---------+-----------+----------+--------------+ FV DistalFull                                                        +---------+---------------+---------+-----------+----------+--------------+ PFV      Full                                                        +---------+---------------+---------+-----------+----------+--------------+  POP      Full           Yes      Yes                                 +---------+---------------+---------+-----------+----------+--------------+ PTV      Full                                                        +---------+---------------+---------+-----------+----------+--------------+ PERO     Full                                                        +---------+---------------+---------+-----------+----------+--------------+   +----+---------------+---------+-----------+----------+--------------+ LEFTCompressibilityPhasicitySpontaneityPropertiesThrombus Aging  +----+---------------+---------+-----------+----------+--------------+ CFV Full           Yes      Yes                                 +----+---------------+---------+-----------+----------+--------------+     Summary: RIGHT: - There is no evidence of deep vein thrombosis in the lower extremity.  - No cystic structure found in the popliteal fossa.  LEFT: - No evidence of common femoral vein obstruction.   *See table(s) above for measurements and observations.    Preliminary    CT CHEST ABDOMEN PELVIS W CONTRAST  Result Date: 03/26/2023 CLINICAL DATA:  Retroperitoneal hematoma, fall, chronic anticoagulation EXAM: CT CHEST, ABDOMEN, AND PELVIS WITH CONTRAST TECHNIQUE: Multidetector CT imaging of the chest, abdomen and pelvis was performed following the standard protocol during bolus administration of intravenous contrast. RADIATION DOSE REDUCTION: This exam was performed according to the departmental dose-optimization program which includes automated exposure control, adjustment of the mA and/or kV according to patient size and/or use of iterative reconstruction technique. CONTRAST:  80mL OMNIPAQUE IOHEXOL 350 MG/ML SOLN COMPARISON:  CT pelvis 04/16/2019 FINDINGS: CT CHEST FINDINGS Cardiovascular: Global cardiac size within normal limits. 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 Left subclavian pacemaker leads are seen within the right atrium and right ventricle. Coronary artery bypass grafting has been performed. No pericardial effusion. Central pulmonary arteries are enlarged in keeping with changes of pulmonary arterial hypertension. Extensive atherosclerotic calcification within the thoracic aorta. No aortic aneurysm. Mediastinum/Nodes: 15 mm nodule within the right thyroid gland is indeterminate, not well characterized on this examination. Numerous pathologically enlarged mediastinal lymph nodes  are identified measuring up to 19 mm in short axis diameter within the subcarinal lymph node groups. These may be reactive, inflammatory, or lymphoproliferative in nature. Lungs/Pleura: Moderate emphysema. Small bilateral pleural effusions are present with associated bibasilar compressive atelectasis. Fluid within the right major fissure noted. There is superimposed multifocal ground-glass pulmonary infiltrate within the upper lobes bilaterally, new within the lung apices to prior CT examination 03/04/2023, more focal within the left upper lobe in keeping with acute infection in the appropriate clinical setting. More solid-appearing 16 mm nodule within the basilar right middle lobe axial image # 120/5 is indeterminate. No pneumothorax. No  central obstructing lesion. Musculoskeletal: No acute bone abnormality. No lytic or blastic bone lesion. CT ABDOMEN PELVIS FINDINGS Hepatobiliary: No focal liver abnormality is seen. Status post cholecystectomy. No biliary dilatation. Pancreas: Unremarkable Spleen: Unremarkable Adrenals/Urinary Tract: The adrenal glands are unremarkable. The kidneys are normal in position. There is marked cortical atrophy of the right kidney which demonstrates delayed and decreased relative perfusion. The left kidney is normal in size. No hydronephrosis. No definite intrarenal or ureteral calculi. The bladder is unremarkable. Stomach/Bowel: Moderate descending and sigmoid colonic diverticulosis. Stomach, small bowel, and large bowel are otherwise unremarkable. Appendix normal. No free intraperitoneal gas or fluid. Vascular/Lymphatic: Extensive aortoiliac atherosclerotic calcification. No aortic aneurysm. No pathologic adenopathy within the abdomen and pelvis. Reproductive: Prostate is unremarkable. Other: Multilocular retroperitoneal acute to subacute hematoma is seen with a component noted superiorly along the posterolateral abdominal wall measuring 4.4 x 2.6 cm at axial image # 87/3. There is  intramuscular hemorrhage within the right iliacus muscle with the dominant collection measuring 4.5 x 8.1 cm at axial image # 104/3. Intramuscular hemorrhage is seen within the distal iliopsoas muscle extending to level of the lesser trochanter. No active extravasation identified. Extensive circumferential subcutaneous edema is seen in the proximal right. Musculoskeletal: Osseous structures are age-appropriate. No acute bone abnormality. No lytic or blastic bone lesion. IMPRESSION: 1. Multilocular acute to subacute retroperitoneal hematoma with a component noted superiorly along the posterolateral abdominal wall measuring 4.4 x 2.6 cm. Intramuscular hemorrhage within the right iliacus muscle with the dominant collection measuring 4.5 x 8.1 cm. Intramuscular hemorrhage also noted within the distal iliopsoas muscle extending to level of the lesser trochanter. No active extravasation identified. 2. Small bilateral pleural effusions with associated bibasilar compressive atelectasis. 3. Multifocal ground-glass pulmonary infiltrate within the upper lobes bilaterally 7 port in keeping with acute infection in the appropriate clinical setting. 4. 16 mm nodule within the basilar right middle lobe, indeterminate. Consider one of the following in 3 months for both low-risk and high-risk individuals: (a) repeat chest CT, (b) follow-up PET-CT, or (c) tissue sampling. This recommendation follows the consensus statement: Guidelines for Management of Incidental Pulmonary Nodules Detected on CT Images: From the Fleischner Society 2017; Radiology 2017; 284:228-243. 5. Marked cortical atrophy of the right kidney 6. Moderate distal colonic diverticulosis. 7. 15 mm nodule within the right thyroid gland, not well characterized on this examination. In the setting of significant comorbidities or limited life expectancy, no follow-up recommended (ref: J Am Coll Radiol. 2015 Feb;12(2): 143-50). Aortic Atherosclerosis (ICD10-I70.0) and  Emphysema (ICD10-J43.9). Electronically Signed   By: Helyn Numbers M.D.   On: 03/26/2023 04:08   CT HIP RIGHT WO CONTRAST  Result Date: 03/26/2023 CLINICAL DATA:  Larey Seat 2 weeks ago. Progressive worsening right hip pain. EXAM: CT OF THE RIGHT HIP WITHOUT CONTRAST TECHNIQUE: Multidetector CT imaging of the right hip was performed according to the standard protocol. Multiplanar CT image reconstructions were also generated. RADIATION DOSE REDUCTION: This exam was performed according to the departmental dose-optimization program which includes automated exposure control, adjustment of the mA and/or kV according to patient size and/or use of iterative reconstruction technique. COMPARISON:  Radiograph 03/25/2023 FINDINGS: Bones/Joint/Cartilage No acute fracture or dislocation.  Degenerative arthritis right hip. Ligaments Suboptimally assessed by CT. Muscles and Tendons Retroperitoneal low to intermediate density fluid collection extending into the distal right psoas and right iliacus muscle compatible with retroperitoneal hematoma. This measures 7.6 x 5.8 cm. Soft tissues Nonspecific edema in the proximal right thigh. Vascular calcifications. IMPRESSION: 1. No acute  fracture or dislocation. 2. Retroperitoneal hematoma extending into the distal right psoas and right iliacus muscle. Electronically Signed   By: Minerva Fester M.D.   On: 03/26/2023 01:33   DG Hip Unilat W or Wo Pelvis 2-3 Views Right  Result Date: 03/25/2023 CLINICAL DATA:  Hip pain EXAM: DG HIP (WITH OR WITHOUT PELVIS) 2-3V RIGHT COMPARISON:  Right hip x-ray 03/05/2023 FINDINGS: The bones are mildly osteopenic. There is no acute fracture or dislocation. There are moderate degenerative changes of both hips similar to the prior study. Peripheral vascular calcifications are present. IMPRESSION: 1. No acute fracture or dislocation. 2. Moderate degenerative changes of both hips. Electronically Signed   By: Darliss Cheney M.D.   On: 03/25/2023 22:05     Assessment and Plan: #Retroperitoneal Hematoma CT (R) Hip with Retroperitoneal hematoma extending into the right psoas and right iliacus muscle - Hold home Eliquis - Trend HGB - PRN Analgesia - PT consult  #Community Acquired Pneumonia Multifocal ground glass pulmonary infiltrate in bilateral upper lobes - Continue Rocephin and Azithromycin - Mucinex - Nebulizers as needed - Pulmonary Toilet, incentive spirometer while awake - Tessalon Pearls  #(R) Lower Leg Swelling -US DVT (R)LE  #Thrombocytosis Reactive - Trend PLT with AM CBC  #Transaminitis Continuing to trend up from last hospitalization - Hepatitis penal - Acetaminophen level - Urine drug screen - Hold lipitor  #Chronic Kidney Disease Stage IIIB Creatinine 1.96 which is improved from 2.36 at time of discharge two weeks ago. -Avoid nephrotoxins, Contrast Dyes, Hypotension and Dehydration to Ensure Adequate Renal Perfusion and will need to Renally Adjust Meds -Continue to Monitor and Trend Renal Function carefully and repeat BMP in the AM   #Chronic HFrEF S/p AICD - Hold Entresto in setting of soft BP - Continue home Torsemide  #Atrial Fibrillation/Flutter - Hold Eliquis in setting of retroperitoneal hematoma - Continue amiodarone  #Hypertension - Continue home Torsemide, hold Entresto  #Coronary Artery Disease - Hold lipitor in setting of transaminitis  Type II Diabetes Mellitus, diet controlled - ACHS CBG monitoring and SSI   VTE prophylaxis: Pharm held in setting of retroperitoneal hematoma, SCDs held while DVT ruled out. GI prophylaxis: Not indicated  Diet: Heart healthy Access: PIV Lines: NONE Code Status: FULL Telemetry: No Disposition: Admit to Med-Surg   Advance Care Planning:   Code Status: Full Code   Consults: N/A  Family Communication: Wife Lupita Leash [phone] called, no answer, HIPAA compliant voicemail left  Severity of Illness: The appropriate patient status for this  patient is INPATIENT. Inpatient status is judged to be reasonable and necessary in order to provide the required intensity of service to ensure the patient's safety. The patient's presenting symptoms, physical exam findings, and initial radiographic and laboratory data in the context of their chronic comorbidities is felt to place them at high risk for further clinical deterioration. Furthermore, it is not anticipated that the patient will be medically stable for discharge from the hospital within 2 midnights of admission.   * I certify that at the point of admission it is my clinical judgment that the patient will require inpatient hospital care spanning beyond 2 midnights from the point of admission due to high intensity of service, high risk for further deterioration and high frequency of surveillance required.*  To reach the provider On-Call:   7AM- 7PM see care teams to locate the attending and reach out to them via [email or web address] Password: [email or web address] 7PM-7AM contact night-coverage If you still have difficulty reaching the appropriate provider, please page the  DOC (Director on Call) for [facility] on amion for assistance  This document was prepared using Conservation officer, historic buildings and may include unintentional dictation errors.  Bishop Limbo FNP-BC, PMHNP-BC Nurse Practitioner [facility]   Attending Note> Agree with above nurse practitioner note. Patient was seen and examined independently, all question answered.  This 77 yrs old Male with PMH significant for hypertension, Coronary artery disease, paroxysmal atrial fibrillation/flutter, chronic systolic CHF, s/p PPM, PVD, diabetes mellitus type 2, bladder cancer, and CKD  presented with Right hip pain.  Of note he was admitted from 03/05/23 -03/08/23 after a fall at home which was thought secondary to syncope and hypotension where he suffered right distal clavicle fracture that was managed non-operatively.  He  returns to [facility] ED last night with progressively worsening (R) hip pain for the last 2 days, He now has significant pain with weightbearing and is unable to ambulate independently. CT hip shows evidence of retroperitoneal hematoma extending into the right distal Psoas and right iliacus muscle.  CT chest showed multifocal groundglass opacities in bilateral apices.  Patient admitted for further evaluation.  ED physician discussed the case with trauma physician Dr. Bonita Quin who recommended continue supportive care PT and OT evaluation and pain control.  Patient is started on ceftriaxone and Zithromax for possible community-acquired pneumonia.  Patient needs PT and OT evaluation for disposition.

## 2023-03-26 NOTE — TOC Initial Note (Signed)
Transition of Care (TOC) - Initial/Assessment Note   Spoke to patient at bedside. From home with wife. Active with [facility] for home health PT. Confirmed with Kandee Keen with [facility]. [facility] will need resumption of care orders, entered for MD signature.   Patient has walker and raised commode seat at home.   TOC will continue to follow for disposition needs.  Patient Details  Name: [name] [name] MRN: [id number] Date of Birth: [birth year]  Transition of Care [facility] CM/SW Contact:    Kingsley Plan, RN Phone Number: 03/26/2023, 10:27 AM  Clinical Narrative:                   Expected Discharge Plan: Home w Home Health Services Barriers to Discharge: Continued Medical Work up   Patient Goals and CMS Choice Patient states their goals for this hospitalization and ongoing recovery are:: to go home CMS Medicare.gov Compare Post Acute Care list provided to:: Patient Choice offered to / list presented to : Patient      Expected Discharge Plan and Services   Discharge Planning Services: CM Consult Post Acute Care Choice: Home Health Living arrangements for the past 2 months: Single Family Home                 DME Arranged: N/A DME Agency: NA       HH Arranged: PT HH Agency: [facility] Date [facility] Agency Contacted: 03/26/23 Time HH Agency Contacted: 1026 Representative spoke with at [facility] Agency: Kandee Keen, patient active for HHPT need resumption of care orders  Prior Living Arrangements/Services Living arrangements for the past 2 months: Single Family Home Lives with:: Spouse Patient language and need for interpreter reviewed:: Yes Do you feel safe going back to the place where you live?: Yes      Need for Family Participation in Patient Care: Yes (Comment) Care giver support system in place?: Yes (comment) Current home services: DME, Home PT Criminal Activity/Legal Involvement Pertinent to Current Situation/Hospitalization: No - Comment as needed  Activities of Daily  Living      Permission Sought/Granted   Permission granted to share information with : Yes, Verbal Permission Granted     Permission granted to share info w AGENCY: [facility]        Emotional Assessment Appearance:: Appears stated age Attitude/Demeanor/Rapport: Engaged Affect (typically observed): Accepting Orientation: : Oriented to Self, Oriented to Place, Oriented to  Time, Oriented to Situation Alcohol / Substance Use: Not Applicable Psych Involvement: No (comment)  Admission diagnosis:  Retroperitoneal hematoma [K68.3] Right hip pain [M25.551] Traumatic retroperitoneal hematoma, initial encounter [E95.284X] Patient Active Problem List   Diagnosis Date Noted   Retroperitoneal hematoma 03/26/2023   SIRS (systemic inflammatory response syndrome) (HCC) 03/05/2023   Fall at home, initial encounter 03/05/2023   Syncope 03/05/2023   Transient hypotension 03/05/2023   Acute kidney injury superimposed on chronic kidney disease (HCC) 03/05/2023   Heart failure with reduced ejection fraction (HCC) 03/05/2023   Thyroid nodule 03/05/2023   Acute on chronic combined systolic and diastolic CHF (congestive heart failure) (HCC) 08/02/2021   Paroxysmal A-fib/flutter 08/02/2021   Cholelithiasis with choledocholithiasis 08/19/2020   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   Ischemic cardiomyopathy 11/20/2017   Elevated troponin and patient with history of CAD/CABG 05/10/2017   Chronic combined systolic and diastolic CHF (congestive heart failure) (HCC) 05/10/2017   Chronic kidney disease, stage 3b (HCC) 05/10/2017   Acute epididymitis 05/10/2017   Left epididymitis  Orchitis, right    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 [place] [address] Phone: [phone] Fax: [phone]  [facility] [place], [place] - [facility] AT Portal to Registered [address] [place] [address] Phone: [phone] Fax: [phone]  [facility] [address] [place] [address] Phone: [phone] Fax: [phone]     Social Determinants of Health (SDOH) Social History: SDOH Screenings   Food Insecurity: Low Risk  (03/11/2023)   Received from [facility]  Housing: Low Risk  (03/05/2023)  Transportation Needs: Unmet Transportation Needs (03/11/2023)   Received from [facility]  Utilities: Low Risk  (03/11/2023)   Received from [facility]  Physical Activity: Inactive (07/11/2017)  Stress: No Stress Concern Present (07/11/2017)  Tobacco Use: Medium Risk (03/07/2023)   SDOH Interventions:     Readmission Risk Interventions     No data to display

## 2023-03-26 NOTE — ED Provider Notes (Signed)
  Physical Exam  BP (!) 126/57   Pulse 70   Temp 98.1 F (36.7 C) (Oral)   Resp (!) 25   SpO2 99%   Physical Exam  Procedures  Procedures  ED Course / MDM   Clinical Course as of 03/26/23 0637  Tue Mar 26, 2023  0527 Consult to Dr. Bedelia Person, trauma, who states patient can receive robaxin, oxycodone, tylenol and d/c home if hemodynamically stable and able to ambulate. I appreciate her collaboration in the care of this patient.  [RS]  1610 Consulted Dr. Antionette Char, hospitalist was agreeable to admit this patient to his service here.  I appreciate his collaboration care of the patient. [RS]    Clinical Course User Index [RS] Malcom Selmer, Eugene Gavia, PA-C   Medical Decision Making Amount and/or Complexity of Data Reviewed Labs: ordered. Radiology: ordered.    Details: CT hip with retroperitoneal hematoma on the right extending into the distal right psoas and right iliac [place] measuring 7.6 x 5.8 cm.  Risk Prescription drug management. Decision regarding hospitalization.    Care of this patient is is assumed from preceding ED provider Rosette Reveal, PA-C at time of shift change.  Please see his associated note for further insight of the patient's ED course.  Patient was admitted 11 4-11 8 after fall on Eliquis.  Returning with progressively worsening right-sided hip and flank pain now unable to bear weight secondary to pain utilizing walker.  No abdominal pelvic imaging at that time aside from pelvic x-ray in th ED. Labs with stable hemoglobin at time of his arrival to the today.  Baseline creatinine 1.9, patient is not an ESRD patient.  At time of shift change pending CT of the right hip to rule out occult hip fracture.  CT hip with retroperitoneal hematoma, CT abdomen pelvis with intramuscular hemorrhaging and iliac [place] and iliopsoas on the right, no active extra off.  Patient ambulated but unable to sit up in bed independently.  Once assisted to standing was able to ambulate with 2 person assist  and walker though with significant pain.  Shared decision making with the patient regarding disposition.  Patient preference to be admitted for pain management and PT evaluation.  Feel this is reasonable.  Additional CT findings suggestive of pneumonia, antibiotic coverage administered in the ED.  Clinical concern for emergent underlying condition that would warrant further ED workup is exceedingly low.  [name] [name] and his wife  voiced understanding of her medical evaluation and treatment plan. Each of their questions answered to their expressed satisfaction.  They are amenable plan for admission This chart was dictated using voice recognition software, Dragon. Despite the best efforts of this provider to proofread and correct errors, errors may still occur which can change documentation meaning.      Paris Lore, PA-C 03/26/23 [id number]    Glynn Octave, MD 03/26/23 [phone]

## 2023-03-26 NOTE — Progress Notes (Signed)
PT Cancellation Note  Patient Details Name: [name] [name] MRN: [id number] DOB: 1945-12-28   Cancelled Treatment:    Reason Eval/Treat Not Completed: Medical issues which prohibited therapy. Pt awaiting RLE ultrasound to rule out potential DVT. PT will follow up after results from ultrasound are documented.   Arlyss Gandy 03/26/2023, 1:18 PM

## 2023-03-26 NOTE — ED Notes (Signed)
Pt w/ great difficulty sitting on the side of the bed. Pt was able to get to a seated position on the side of the bed with this Rns help and a NT's help. Pt was able to ambulate with a walker, however complains of 10/10 pain in that right hip with ambulation. Sponseller, PA notified.

## 2023-03-26 NOTE — ED Notes (Signed)
ED TO INPATIENT HANDOFF REPORT  ED Nurse Name and Phone #: Alycia Rossetti [phone]  S Name/Age/Gender [name] [name] 77 y.o. male Room/Bed: 025C/025C  Code Status   Code Status: Prior  Home/SNF/Other Home Patient oriented to: self, place, time, and situation Is this baseline? Yes   Triage Complete: Triage complete  Chief Complaint Retroperitoneal hematoma [K68.3]  Triage Note Patient fell two weeks ago and was checked out here. He says other than a shoulder injury he was sent home. He has right hip pain, bruising, and swelling. He was taking tramadol for pain but stopped because of the side effects. He states the pain has been getting progressively worse. O2 has been running a bit low with EMS. He doesn't usually wear O2 at home. He states he feels a little short of breath. Here on room air he is 97% for EMS he was in the high 80s.     Allergies Allergies  Allergen Reactions   Spironolactone Other (See Comments)    Painful gynecomastia   Semaglutide     Nausea and bloating   Tramadol Other (See Comments)    Sleep disturbance.  Apnea spells.   Empagliflozin Rash    Rash at the groin Jardiance   Niacin And Related Hives    Level of Care/Admitting Diagnosis ED Disposition     ED Disposition  Admit   Condition  --   Comment  Hospital Area: [facility] [[id number]]  Level of Care: Med-Surg [16]  May admit patient to [facility] or [facility] if equivalent level of care is available:: No  Covid Evaluation: Asymptomatic - no recent exposure (last 10 days) testing not required  Diagnosis: Retroperitoneal hematoma [[id number]]  Admitting Physician: Briscoe Deutscher [[id number]]  Attending Physician: Briscoe Deutscher [[id number]]  Certification:: I certify this patient will need inpatient services for at least 2 midnights  Expected Medical Readiness: 03/28/2023          B Medical/Surgery History Past Medical History:  Diagnosis Date   AICD (automatic  cardioverter/defibrillator) present    Arthritis    Bladder cancer (HCC)    Borderline glaucoma    Chronic systolic (congestive) heart failure (HCC)    Coronary artery disease    Diabetes mellitus type 2, controlled (HCC) ORAL MED   Essential hypertension    Heart murmur    Hepatitis    Medical history non-contributory    Paroxysmal A-fib (HCC)    Paroxysmal A-fib (HCC) 08/20/2020   Peripheral vascular disease (HCC)    Pneumonia    Past Surgical History:  Procedure Laterality Date   CARDIOVERSION N/A 08/10/2021   Procedure: CARDIOVERSION;  Surgeon: Dolores Patty, MD;  Location: [facility] ENDOSCOPY;  Service: Cardiovascular;  Laterality: N/A;   CARDIOVERSION N/A 11/09/2022   Procedure: CARDIOVERSION;  Surgeon: Maisie Fus, MD;  Location: [facility] INVASIVE CV LAB;  Service: Cardiovascular;  Laterality: N/A;   CARDIOVERSION N/A 03/07/2023   Procedure: CARDIOVERSION (CATH LAB);  Surgeon: Tessa Lerner, DO;  Location: [facility] INVASIVE CV LAB;  Service: Cardiovascular;  Laterality: N/A;   CAROTID DUPLEX SCAN  07-26-2010   BILATERAL ICA  STENOSIS 1% - 39%   CCM GENERATOR INSERTION N/A 01/04/2023   Procedure: CCM GENERATOR INSERTION;  Surgeon: Duke Salvia, MD;  Location: [facility] INVASIVE CV LAB;  Service: Cardiovascular;  Laterality: N/A;   CHOLECYSTECTOMY N/A 08/22/2020   Procedure: LAPAROSCOPIC CHOLECYSTECTOMY;  Surgeon: Emelia Loron, MD;  Location: [facility] OR;  Service: General;  Laterality: N/A;   CORONARY ARTERY  BYPASS GRAFT N/A 04/08/2017   Procedure: CORONARY ARTERY BYPASS GRAFTING (CABG) TIMES TWO USING LEFT INTERNAL MAMMARY ARTERY AND LEFT SAPHENOUS LEG VEIN HARVESTED ENDOSCOPICALLY.  LEG VEIN ALSO HARVESTED FROM THE RIGHT LEG;  Surgeon: Alleen Borne, MD;  Location: [facility] OR;  Service: Open Heart Surgery;  Laterality: N/A;   CYSTOSCOPY WITH BIOPSY N/A 08/01/2012   Procedure: CYSTOSCOPY WITH BIOPSY BLADDER BIOPSY   ;  Surgeon: Antony Haste, MD;  Location: [facility];   Service: Urology;  Laterality: N/A;   ERCP N/A 08/21/2020   Procedure: ENDOSCOPIC RETROGRADE CHOLANGIOPANCREATOGRAPHY (ERCP);  Surgeon: Jeani Hawking, MD;  Location: [facility] ENDOSCOPY;  Service: Endoscopy;  Laterality: N/A;   FULGURATION OF BLADDER TUMOR N/A 08/01/2012   Procedure: FULGURATION OF BLADDER TUMOR;  Surgeon: Antony Haste, MD;  Location: [facility];  Service: Urology;  Laterality: N/A;   ICD IMPLANT  11/20/2017   ICD IMPLANT N/A 11/20/2017   Procedure: ICD IMPLANT;  Surgeon: Duke Salvia, MD;  Location: [facility] INVASIVE CV LAB;  Service: Cardiovascular;  Laterality: N/A;   LEFT HEART CATH AND CORONARY ANGIOGRAPHY N/A 03/31/2017   Procedure: LEFT HEART CATH AND CORONARY ANGIOGRAPHY;  Surgeon: Marykay Lex, MD;  Location: [facility] INVASIVE CV LAB;  Service: Cardiovascular;  Laterality: N/A;   LUMBAR LAMINECTOMY/DECOMPRESSION MICRODISCECTOMY Bilateral 06/16/2019   Procedure: Bilateral Lumbar Four-Five Laminectomy and Foraminotomy;  Surgeon: Julio Sicks, MD;  Location: [facility] OR;  Service: Neurosurgery;  Laterality: Bilateral;  posterior   REMOVAL OF STONES  08/21/2020   Procedure: REMOVAL OF STONES;  Surgeon: Jeani Hawking, MD;  Location: [facility] ENDOSCOPY;  Service: Endoscopy;;   RIGHT HEART CATH Right 03/31/2017   Procedure: RIGHT HEART CATH;  Surgeon: Marykay Lex, MD;  Location: [facility] INVASIVE CV LAB;  Service: Cardiovascular;  Laterality: Right;   SPHINCTEROTOMY  08/21/2020   Procedure: SPHINCTEROTOMY;  Surgeon: Jeani Hawking, MD;  Location: [facility] ENDOSCOPY;  Service: Endoscopy;;   TEE WITHOUT CARDIOVERSION N/A 04/08/2017   Procedure: TRANSESOPHAGEAL ECHOCARDIOGRAM (TEE);  Surgeon: Alleen Borne, MD;  Location: [facility] OR;  Service: Open Heart Surgery;  Laterality: N/A;   TRANSESOPHAGEAL ECHOCARDIOGRAM (CATH LAB) N/A 03/07/2023   Procedure: TRANSESOPHAGEAL ECHOCARDIOGRAM;  Surgeon: Tessa Lerner, DO;  Location: [facility] INVASIVE CV LAB;  Service: Cardiovascular;  Laterality: N/A;    TRANSURETHRAL RESECTION OF BLADDER TUMOR  05/29/2011   Procedure: TRANSURETHRAL RESECTION OF BLADDER TUMOR (TURBT);  Surgeon: Antony Haste, MD;  Location: [facility];  Service: Urology;  Laterality: N/A;   VENTRICULAR ASSIST DEVICE INSERTION Right 03/31/2017   Procedure: VENTRICULAR ASSIST DEVICE INSERTION;  Surgeon: Marykay Lex, MD;  Location: [facility] INVASIVE CV LAB;  Service: Cardiovascular;  Laterality: Right;     A IV Location/Drains/Wounds Patient Lines/Drains/Airways Status     Active Line/Drains/Airways     Name Placement date Placement time Site Days   Peripheral IV 03/25/23 20 G Anterior;Distal;Left;Upper Arm 03/25/23  2021  Arm  1   Incision - 4 Ports Abdomen 1: Right 2: Right 3: Mid;Upper 4: Mid 08/22/20  1240  -- 946   Wound / Incision (Open or Dehisced) 08/21/20 Skin tear Elbow Left;Posterior 08/21/20  0831  Elbow  947            Intake/Output Last 24 hours No intake or output data in the 24 hours ending 03/26/23 9147  Labs/Imaging Results for orders placed or performed during the hospital encounter of 03/25/23 (from the past 48 hour(s))  CBC with Differential  Status: Abnormal   Collection Time: 03/25/23  8:25 PM  Result Value Ref Range   WBC 14.2 (H) 4.0 - 10.5 K/uL   RBC 4.14 (L) 4.22 - 5.81 MIL/uL   Hemoglobin 10.9 (L) 13.0 - 17.0 g/dL   HCT 54.0 (L) 98.1 - 19.1 %   MCV 85.5 80.0 - 100.0 fL   MCH 26.3 26.0 - 34.0 pg   MCHC 30.8 30.0 - 36.0 g/dL   RDW 47.8 (H) 29.5 - 62.1 %   Platelets 555 (H) 150 - 400 K/uL   nRBC 0.0 0.0 - 0.2 %   Neutrophils Relative % 85 %   Neutro Abs 12.0 (H) 1.7 - 7.7 K/uL   Lymphocytes Relative 5 %   Lymphs Abs 0.8 0.7 - 4.0 K/uL   Monocytes Relative 9 %   Monocytes Absolute 1.2 (H) 0.1 - 1.0 K/uL   Eosinophils Relative 0 %   Eosinophils Absolute 0.1 0.0 - 0.5 K/uL   Basophils Relative 0 %   Basophils Absolute 0.1 0.0 - 0.1 K/uL   Immature Granulocytes 1 %   Abs Immature Granulocytes 0.09 (H)  0.00 - 0.07 K/uL    Comment: Performed at [facility], [address] [place], [place] [address]  Comprehensive metabolic panel     Status: Abnormal   Collection Time: 03/25/23  8:25 PM  Result Value Ref Range   Sodium 133 (L) 135 - 145 mmol/L   Potassium 3.7 3.5 - 5.1 mmol/L   Chloride 105 98 - 111 mmol/L   CO2 21 (L) 22 - 32 mmol/L   Glucose, Bld 176 (H) 70 - 99 mg/dL    Comment: Glucose reference range applies only to samples taken after fasting for at least 8 hours.   BUN 32 (H) 8 - 23 mg/dL   Creatinine, Ser 7.84 (H) 0.61 - 1.24 mg/dL   Calcium 8.1 (L) 8.9 - 10.3 mg/dL   Total Protein 6.1 (L) 6.5 - 8.1 g/dL   Albumin 2.4 (L) 3.5 - 5.0 g/dL   AST 74 (H) 15 - 41 U/L   ALT 85 (H) 0 - 44 U/L   Alkaline Phosphatase 115 38 - 126 U/L   Total Bilirubin 1.1 <1.2 mg/dL   GFR, Estimated 35 (L) >60 mL/min    Comment: (NOTE) Calculated using the CKD-EPI Creatinine Equation (2021)    Anion gap 7 5 - 15    Comment: Performed at [facility], [address] [place], [place] [address]  Protime-INR     Status: Abnormal   Collection Time: 03/26/23  5:15 AM  Result Value Ref Range   Prothrombin Time 27.0 (H) 11.4 - 15.2 seconds   INR 2.5 (H) 0.8 - 1.2    Comment: (NOTE) INR goal varies based on device and disease states. Performed at [facility], [address] [place], [place] [address]    CT CHEST ABDOMEN PELVIS W CONTRAST  Result Date: 03/26/2023 CLINICAL DATA:  Retroperitoneal hematoma, fall, chronic anticoagulation EXAM: CT CHEST, ABDOMEN, AND PELVIS WITH CONTRAST TECHNIQUE: Multidetector CT imaging of the chest, abdomen and pelvis was performed following the standard protocol during bolus administration of intravenous contrast. RADIATION DOSE REDUCTION: This exam was performed according to the departmental dose-optimization program which includes automated exposure control, adjustment of the mA and/or kV according to patient size and/or use of iterative  reconstruction technique. CONTRAST:  80mL OMNIPAQUE IOHEXOL 350 MG/ML SOLN COMPARISON:  CT pelvis 04/16/2019 FINDINGS: CT CHEST FINDINGS Cardiovascular: Global cardiac size  within normal limits. 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 Left subclavian pacemaker leads are seen within the right atrium and right ventricle. Coronary artery bypass grafting has been performed. No pericardial effusion. Central pulmonary arteries are enlarged in keeping with changes of pulmonary arterial hypertension. Extensive atherosclerotic calcification within the thoracic aorta. No aortic aneurysm. Mediastinum/Nodes: 15 mm nodule within the right thyroid gland is indeterminate, not well characterized on this examination. Numerous pathologically enlarged mediastinal lymph nodes are identified measuring up to 19 mm in short axis diameter within the subcarinal lymph node groups. These may be reactive, inflammatory, or lymphoproliferative in nature. Lungs/Pleura: Moderate emphysema. Small bilateral pleural effusions are present with associated bibasilar compressive atelectasis. Fluid within the right major fissure noted. There is superimposed multifocal ground-glass pulmonary infiltrate within the upper lobes bilaterally, new within the lung apices to prior CT examination 03/04/2023, more focal within the left upper lobe in keeping with acute infection in the appropriate clinical setting. More solid-appearing 16 mm nodule within the basilar right middle lobe axial image # 120/5 is indeterminate. No pneumothorax. No central obstructing lesion. Musculoskeletal: No acute bone abnormality. No lytic or blastic bone lesion. CT ABDOMEN PELVIS FINDINGS Hepatobiliary: No focal liver abnormality is seen. Status post cholecystectomy. No biliary dilatation. Pancreas: Unremarkable Spleen: Unremarkable Adrenals/Urinary Tract: The adrenal glands are  unremarkable. The kidneys are normal in position. There is marked cortical atrophy of the right kidney which demonstrates delayed and decreased relative perfusion. The left kidney is normal in size. No hydronephrosis. No definite intrarenal or ureteral calculi. The bladder is unremarkable. Stomach/Bowel: Moderate descending and sigmoid colonic diverticulosis. Stomach, small bowel, and large bowel are otherwise unremarkable. Appendix normal. No free intraperitoneal gas or fluid. Vascular/Lymphatic: Extensive aortoiliac atherosclerotic calcification. No aortic aneurysm. No pathologic adenopathy within the abdomen and pelvis. Reproductive: Prostate is unremarkable. Other: Multilocular retroperitoneal acute to subacute hematoma is seen with a component noted superiorly along the posterolateral abdominal wall measuring 4.4 x 2.6 cm at axial image # 87/3. There is intramuscular hemorrhage within the right iliacus muscle with the dominant collection measuring 4.5 x 8.1 cm at axial image # 104/3. Intramuscular hemorrhage is seen within the distal iliopsoas muscle extending to level of the lesser trochanter. No active extravasation identified. Extensive circumferential subcutaneous edema is seen in the proximal right. Musculoskeletal: Osseous structures are age-appropriate. No acute bone abnormality. No lytic or blastic bone lesion. IMPRESSION: 1. Multilocular acute to subacute retroperitoneal hematoma with a component noted superiorly along the posterolateral abdominal wall measuring 4.4 x 2.6 cm. Intramuscular hemorrhage within the right iliacus muscle with the dominant collection measuring 4.5 x 8.1 cm. Intramuscular hemorrhage also noted within the distal iliopsoas muscle extending to level of the lesser trochanter. No active extravasation identified. 2. Small bilateral pleural effusions with associated bibasilar compressive atelectasis. 3. Multifocal ground-glass pulmonary infiltrate within the upper lobes bilaterally 7  port in keeping with acute infection in the appropriate clinical setting. 4. 16 mm nodule within the basilar right middle lobe, indeterminate. Consider one of the following in 3 months for both low-risk and high-risk individuals: (a) repeat chest CT, (b) follow-up PET-CT, or (c) tissue sampling. This recommendation follows the consensus statement: Guidelines for Management of Incidental Pulmonary Nodules Detected on CT Images: From the Fleischner Society 2017; Radiology 2017; 284:228-243. 5. Marked cortical atrophy of the right kidney 6. Moderate distal colonic diverticulosis. 7. 15 mm nodule within the right thyroid gland,  not well characterized on this examination. In the setting of significant comorbidities or limited life expectancy, no follow-up recommended (ref: J Am Coll Radiol. 2015 Feb;12(2): 143-50). Aortic Atherosclerosis (ICD10-I70.0) and Emphysema (ICD10-J43.9). Electronically Signed   By: Helyn Numbers M.D.   On: 03/26/2023 04:08   CT HIP RIGHT WO CONTRAST  Result Date: 03/26/2023 CLINICAL DATA:  Larey Seat 2 weeks ago. Progressive worsening right hip pain. EXAM: CT OF THE RIGHT HIP WITHOUT CONTRAST TECHNIQUE: Multidetector CT imaging of the right hip was performed according to the standard protocol. Multiplanar CT image reconstructions were also generated. RADIATION DOSE REDUCTION: This exam was performed according to the departmental dose-optimization program which includes automated exposure control, adjustment of the mA and/or kV according to patient size and/or use of iterative reconstruction technique. COMPARISON:  Radiograph 03/25/2023 FINDINGS: Bones/Joint/Cartilage No acute fracture or dislocation.  Degenerative arthritis right hip. Ligaments Suboptimally assessed by CT. Muscles and Tendons Retroperitoneal low to intermediate density fluid collection extending into the distal right psoas and right iliacus muscle compatible with retroperitoneal hematoma. This measures 7.6 x 5.8 cm. Soft tissues  Nonspecific edema in the proximal right thigh. Vascular calcifications. IMPRESSION: 1. No acute fracture or dislocation. 2. Retroperitoneal hematoma extending into the distal right psoas and right iliacus muscle. Electronically Signed   By: Minerva Fester M.D.   On: 03/26/2023 01:33   DG Hip Unilat W or Wo Pelvis 2-3 Views Right  Result Date: 03/25/2023 CLINICAL DATA:  Hip pain EXAM: DG HIP (WITH OR WITHOUT PELVIS) 2-3V RIGHT COMPARISON:  Right hip x-ray 03/05/2023 FINDINGS: The bones are mildly osteopenic. There is no acute fracture or dislocation. There are moderate degenerative changes of both hips similar to the prior study. Peripheral vascular calcifications are present. IMPRESSION: 1. No acute fracture or dislocation. 2. Moderate degenerative changes of both hips. Electronically Signed   By: Darliss Cheney M.D.   On: 03/25/2023 22:05    Pending Labs Unresulted Labs (From admission, onward)    None       Vitals/Pain Today's Vitals   03/26/23 0445 03/26/23 0500 03/26/23 0543 03/26/23 0545  BP: (!) 120/51 (!) 114/53  (!) 123/55  Pulse: 69 65  71  Resp: 12 13  18   Temp:   97.6 F (36.4 C)   TempSrc:   Oral   SpO2: 91% 95%  92%  PainSc:  5       Isolation Precautions No active isolations  Medications Medications  cefTRIAXone (ROCEPHIN) 1 g in sodium chloride 0.9 % 100 mL IVPB (1 g Intravenous New Bag/Given 03/26/23 0615)  morphine (PF) 2 MG/ML injection 2 mg (2 mg Intravenous Given 03/25/23 2112)  morphine (PF) 4 MG/ML injection 4 mg (4 mg Intravenous Given 03/25/23 2321)  morphine (PF) 2 MG/ML injection 2 mg (2 mg Intravenous Given 03/26/23 0245)  iohexol (OMNIPAQUE) 350 MG/ML injection 80 mL (80 mLs Intravenous Contrast Given 03/26/23 0330)  azithromycin (ZITHROMAX) tablet 500 mg (500 mg Oral Given 03/26/23 0612)  morphine (PF) 2 MG/ML injection 2 mg (2 mg Intravenous Given 03/26/23 4098)    Mobility walks with device     Focused Assessments    R Recommendations:  See Admitting Provider Note  Report given to:   Additional Notes: Patient is pleasant, alert, oriented but not at his baseline with ambulation.

## 2023-03-27 DIAGNOSIS — K683 Retroperitoneal hematoma: Secondary | ICD-10-CM | POA: Diagnosis not present

## 2023-03-27 LAB — CBC
HCT: 29.9 % — ABNORMAL LOW (ref 39.0–52.0)
Hemoglobin: 9.7 g/dL — ABNORMAL LOW (ref 13.0–17.0)
MCH: 26.9 pg (ref 26.0–34.0)
MCHC: 32.4 g/dL (ref 30.0–36.0)
MCV: 82.8 fL (ref 80.0–100.0)
Platelets: 475 10*3/uL — ABNORMAL HIGH (ref 150–400)
RBC: 3.61 MIL/uL — ABNORMAL LOW (ref 4.22–5.81)
RDW: 17.4 % — ABNORMAL HIGH (ref 11.5–15.5)
WBC: 13.8 10*3/uL — ABNORMAL HIGH (ref 4.0–10.5)
nRBC: 0 % (ref 0.0–0.2)

## 2023-03-27 LAB — BASIC METABOLIC PANEL
Anion gap: 9 (ref 5–15)
BUN: 37 mg/dL — ABNORMAL HIGH (ref 8–23)
CO2: 20 mmol/L — ABNORMAL LOW (ref 22–32)
Calcium: 8.1 mg/dL — ABNORMAL LOW (ref 8.9–10.3)
Chloride: 105 mmol/L (ref 98–111)
Creatinine, Ser: 2.25 mg/dL — ABNORMAL HIGH (ref 0.61–1.24)
GFR, Estimated: 29 mL/min — ABNORMAL LOW (ref >60)
Glucose, Bld: 101 mg/dL — ABNORMAL HIGH (ref 70–99)
Potassium: 4.3 mmol/L (ref 3.5–5.1)
Sodium: 134 mmol/L — ABNORMAL LOW (ref 135–145)

## 2023-03-27 LAB — GLUCOSE, CAPILLARY
Glucose-Capillary: 102 mg/dL — ABNORMAL HIGH (ref 70–99)
Glucose-Capillary: 136 mg/dL — ABNORMAL HIGH (ref 70–99)
Glucose-Capillary: 160 mg/dL — ABNORMAL HIGH (ref 70–99)
Glucose-Capillary: 152 mg/dL — ABNORMAL HIGH (ref 70–99)
Glucose-Capillary: 186 mg/dL — ABNORMAL HIGH (ref 70–99)

## 2023-03-27 NOTE — Evaluation (Signed)
Physical Therapy Evaluation Patient Details Name: [name] [name] MRN: [id number] DOB: January 05, 1946 Today's Date: 03/27/2023  History of Present Illness  77 y.o. male presents to [facility] hospital on 03/25/2023 with progressively worsening R hip pain. Pt found to have retroperitoneal hematoma extending into the distal R psoas and R iliacus. Started on antibiotics for possible PNA. Negative [place] for R LE DVT. Pt was recently admitted 11/5-11/8 after a fall and R distal clavicle fx. PMH includes AICD, bladder CA, CHF, CAD, DMII, PAF, PNA.   Clinical Impression  Pt in bed upon arrival and agreeable to PT eval. Prior to admit, pt was independent with a RW for mobility. Pt had a fall ~2 weeks prior and has had increased difficulty ambulating due to pain in R hip. In today's session, pt was able to sit EOB with ModA. Pt leaned heavily to the left and posteriorly. Deferred transfer due to high pain levels and therapist safety w/ heavy lean. Pt presents to therapy session with decreased postural control, balance, LE strength/ROM, activity tolerance, and mobility. Pt would benefit from acute skilled PT to address functional impairments. Recommending post-acute rehab >3 hrs to return to previous level of independence. Anticipate pt will progress well with continued pain management. Pt has strong family support and high motivation. Acute PT to follow.          If plan is discharge home, recommend the following: A lot of help with walking and/or transfers;A lot of help with bathing/dressing/bathroom;Assist for transportation;Help with stairs or ramp for entrance   Can travel by private vehicle    No    Equipment Recommendations None recommended by PT (will continue to assess)  Recommendations for Other Services  Rehab consult    Functional Status Assessment Patient has had a recent decline in their functional status and demonstrates the ability to make significant improvements in function in a reasonable and  predictable amount of time.     Precautions / Restrictions Precautions Precautions: Fall Restrictions Weight Bearing Restrictions: No      Mobility  Bed Mobility Overal bed mobility: Needs Assistance Bed Mobility: Supine to Sit, Sit to Supine     Supine to sit: Mod assist, HOB elevated, Used rails Sit to supine: Mod assist   General bed mobility comments: ModA for sup/sit to assist with moving R LE off EOB and for trunk elevation. ModA for return to supine for LE management. Pt able to pull self towards HOB in trendelenburg    Transfers  General transfer comment: deferred due to high pain levels and therapist safety w/ heavy lean        Balance Overall balance assessment: Needs assistance, History of Falls Sitting-balance support: Feet supported, Bilateral upper extremity supported Sitting balance-Leahy Scale: Poor Sitting balance - Comments: heavy left lateral lean and posterior lean, requires ModA for balance Postural control: Left lateral lean, Posterior lean         Pertinent Vitals/Pain Pain Assessment Pain Assessment: Faces Faces Pain Scale: Hurts little more Pain Location: R hip Pain Descriptors / Indicators: Tender, Discomfort, Grimacing, Guarding Pain Intervention(s): Limited activity within patient's tolerance, Monitored during session, Repositioned    Home Living Family/patient expects to be discharged to:: Private residence Living Arrangements: Spouse/significant other Available Help at Discharge: Family;Available 24 hours/day Type of Home: House Home Access: Stairs to enter Entrance Stairs-Rails: None Entrance Stairs-Number of Steps: 2 Alternate Level Stairs-Number of Steps: flight Home Layout: Multi-level;Bed/bath upstairs (half bath downstairs) Home Equipment: Rolling Walker (2 wheels);Cane - single point;Shower seat  Prior Function Prior Level of Function : Independent/Modified Independent;History of Falls (last six months)         Mobility Comments: uses RW past few weeks after the fall. Increased pain in R hip limiting mobility ADLs Comments: independent     Extremity/Trunk Assessment   Upper Extremity Assessment Upper Extremity Assessment: Defer to OT evaluation    Lower Extremity Assessment Lower Extremity Assessment: Generalized weakness (pain in R hip with mobility)    Cervical / Trunk Assessment Cervical / Trunk Assessment: Kyphotic  Communication   Communication Communication: No apparent difficulties Cueing Techniques: Verbal cues  Cognition Arousal: Alert Behavior During Therapy: WFL for tasks assessed/performed Overall Cognitive Status: Within Functional Limits for tasks assessed         General Comments General comments (skin integrity, edema, etc.): VSS on RA           PT Assessment Patient needs continued PT services  PT Problem List Decreased strength;Decreased range of motion;Decreased activity tolerance;Decreased balance;Decreased mobility;Decreased coordination;Pain       PT Treatment Interventions DME instruction;Gait training;Stair training;Functional mobility training;Therapeutic activities;Therapeutic exercise;Balance training;Neuromuscular re-education;Patient/family education    PT Goals (Current goals can be found in the Care Plan section)  Acute Rehab PT Goals Patient Stated Goal: to get better PT Goal Formulation: With patient Time For Goal Achievement: 04/10/23 Potential to Achieve Goals: Good    Frequency Min 1X/week        AM-PAC PT "6 Clicks" Mobility  Outcome Measure Help needed turning from your back to your side while in a flat bed without using bedrails?: A Little Help needed moving from lying on your back to sitting on the side of a flat bed without using bedrails?: A Lot Help needed moving to and from a bed to a chair (including a wheelchair)?: Total Help needed standing up from a chair using your arms (e.g., wheelchair or bedside chair)?:  Total Help needed to walk in hospital room?: Total Help needed climbing 3-5 steps with a railing? : Total 6 Click Score: 9    End of Session   Activity Tolerance: Patient limited by pain Patient left: in bed;with call bell/phone within reach;with bed alarm set Nurse Communication: Mobility status PT Visit Diagnosis: Unsteadiness on feet (R26.81);Repeated falls (R29.6);Other abnormalities of gait and mobility (R26.89)    Time: 7564-3329 PT Time Calculation (min) (ACUTE ONLY): 21 min   Charges:   PT Evaluation $PT Eval Low Complexity: 1 Low   PT General Charges $$ ACUTE PT VISIT: 1 Visit         Hilton Cork, PT, DPT Secure Chat Preferred  Rehab Office [phone]   Arturo Morton Brion Aliment 03/27/2023, 3:04 PM

## 2023-03-27 NOTE — Plan of Care (Signed)
  Problem: Education: Goal: Knowledge of General Education information will improve Description: Including pain rating scale, medication(s)/side effects and non-pharmacologic comfort measures Outcome: Progressing   Problem: Clinical Measurements: Goal: Ability to maintain clinical measurements within normal limits will improve Outcome: Progressing   Problem: Activity: Goal: Risk for activity intolerance will decrease Outcome: Progressing   Problem: Nutrition: Goal: Adequate nutrition will be maintained Outcome: Progressing   Problem: Coping: Goal: Level of anxiety will decrease Outcome: Progressing   Problem: Elimination: Goal: Will not experience complications related to bowel motility Outcome: Progressing   Problem: Pain Management: Goal: General experience of comfort will improve Outcome: Progressing   Problem: Safety: Goal: Ability to remain free from injury will improve Outcome: Progressing

## 2023-03-27 NOTE — TOC Progression Note (Signed)
Transition of Care [facility]) - Progression Note    Patient Details  Name: [name] [name] MRN: [id number] Date of Birth: [birth year]  [facility] CM/SW Contact  Tom-Johnson, Hershal Coria, RN Phone Number: 03/27/2023, 3:53 PM  Clinical Narrative:     CIR recommended, patient is not yet at a level to tolerate the intensity required to pursue a CIR admit due to pain. CIR will continue to follow and monitor for progress.   CM will continue to follow as patient progresses with care towards discharge.            Expected Discharge Plan: Home w Home Health Services Barriers to Discharge: Continued Medical Work up  Expected Discharge Plan and Services   Discharge Planning Services: CM Consult Post Acute Care Choice: Home Health Living arrangements for the past 2 months: Single Family Home                 DME Arranged: N/A DME Agency: NA       HH Arranged: PT HH AgencyHotel manager Home Health Care Date [facility] Agency Contacted: 03/26/23 Time HH Agency Contacted: 1026 Representative spoke with at [facility] Agency: Kandee Keen, patient active for HHPT need resumption of care orders   Social Determinants of Health (SDOH) Interventions SDOH Screenings   Food Insecurity: No Food Insecurity (03/26/2023)  Housing: Low Risk  (03/26/2023)  Transportation Needs: No Transportation Needs (03/26/2023)  Recent Concern: Transportation Needs - Unmet Transportation Needs (03/11/2023)   Received from [facility]  Utilities: Not At Risk (03/26/2023)  Physical Activity: Inactive (07/11/2017)  Stress: No Stress Concern Present (07/11/2017)  Tobacco Use: Medium Risk (03/26/2023)    Readmission Risk Interventions     No data to display

## 2023-03-27 NOTE — Progress Notes (Signed)
   Inpatient Rehab Admissions Coordinator :  Per therapy recommendations patient was screened for CIR candidacy by Ottie Glazier RN MSN. Patient is not yet at a level to tolerate the intensity required to pursue a CIR admit due to pain. The CIR admissions team will follow and monitor for progress and place a Rehab Consult order if felt to be appropriate. Please contact me with any questions.Recommend to pursue other rehab venue options.   Ottie Glazier RN MSN Admissions Coordinator [phone]

## 2023-03-27 NOTE — Progress Notes (Addendum)
PROGRESS NOTE  [name] [name]  WUJ:[id number] DOB: 05/03/1945 DOA: 03/25/2023 PCP: [name], MD   Brief Narrative: Patient is a 77 year old male with history of hypertension, coronary artery disease, paroxysmal A-fib/flutter, chronic systolic CHF, status post pacemaker placement, peripheral vascular disease, diabetes type 2, bladder cancer, CKD stage IIIb who presented from home with complaint of hip pain after the fall.  He was also admitted on 03/05/2023 and was discharged on 11/24 after he presented with fall at home, found to have right distal clavicle fracture that was managed nonoperatively.  Patient reported worsening progressive right hip pain for last 2 days, unable to ambulate.  On condition he was hemodynamically stable.  Lab work showed WBC count of 14.2, creatinine of 1.9, hemoglobin of 10.9.  CT hip showed evidence of right retroperitoneal hematoma extending into the distal right psoas and right iliacus muscle.  CT chest also showed multifocal groundglass opacities bilaterally.  Started on antibiotics for possible PNA.  PT/OT consulted  Assessment & Plan:  Principal Problem:   Retroperitoneal hematoma  Fall/retroperitoneal hematoma:CT right hip showed Retroperitoneal hematoma extending into the distal right psoas and right iliacus muscle. CT imaging also showed multilocular acute to subacute retroperitoneal hematoma with a component noted superiorly along the posterolateral abdominal wall measuring 4.4 x 2.6 cm. Intramuscular hemorrhage within the right iliacus muscle with the dominant collection measuring 4.5 x 8.1 cm.  Continue pain management, supportive care.  This is most likely secondary to fall.  PT/OT consulted.  Patient lives with his wife, ambulates with help of walker but has not been able to use it recently. Case was discussed with general surgery by ED physician, no surgical intervention recommended.  Community acquired pneumonia: CT imaging showed multilevel  groundglass infiltrates bilaterally.He is  currently on azithromycin, Rocephin.  Continue mucolytics, bronchodilator treatment as needed.  Currently on 2-3 l of oxygen per minute.  He is not on oxygen at home.  Lungs are clear on auscultation.  Will wean the oxygen.  Right lower extremity swelling: Venous Doppler did not show any DVT  Elevated liver enzymes: Mild, continue to monitor  Leukocytosis: This most likely reactive.  Continue to monitor  CKD stage IIIb: Baseline creatinine around 2.5.  Currently kidney function at baseline.  Avoid nephrotoxins  Chronic systolic congestive heart failure: Currently euvolemic.  Status post AICD.  Continue home torsemide.  Entresto on hold due to soft blood pressure.  Atrial flutter/fibrillation: Monitor on telemetry.  Eliquis on hold due to finding of retroperitoneal hematoma.  Continue monitor. Likely hold Eliquis for a week.  Follows with cardiology as an outpatient.  Hypertension: Currently blood pressure soft.  Continue torsemide, Entresto on hold  Coronary artery disease: Lipitor on hold due to elevated enzymes  Type 2 diabetes: Monitor blood sugars, continue sliding scale  Lung nodule: CT imaging showed 16 mm nodule within the basilar right middle lobe, indeterminate. Repeat CT in  3 months or PET -CT     DVT prophylaxis:Place and maintain sequential compression device Start: 03/26/23 1608     Code Status: Full Code  Family Communication: None at bedside  Patient status:Inpatient  Patient is from :Home  Anticipated discharge NF:AOZH vs SnF  Estimated DC date:1-2 days   Consultants: None  Procedures:None  Antimicrobials:  Anti-infectives (From admission, onward)    Start     Dose/Rate Route Frequency Ordered Stop   03/27/23 0600  cefTRIAXone (ROCEPHIN) 1 g in sodium chloride 0.9 % 100 mL IVPB  1 g 200 mL/hr over 30 Minutes Intravenous Every 24 hours 03/26/23 0821 03/31/23 0559   03/27/23 0600  azithromycin  (ZITHROMAX) 500 mg in sodium chloride 0.9 % 250 mL IVPB        500 mg 250 mL/hr over 60 Minutes Intravenous Every 24 hours 03/26/23 0821 03/31/23 0559   03/26/23 0615  cefTRIAXone (ROCEPHIN) 1 g in sodium chloride 0.9 % 100 mL IVPB        1 g 200 mL/hr over 30 Minutes Intravenous  Once 03/26/23 0600 03/26/23 0744   03/26/23 0615  azithromycin (ZITHROMAX) tablet 500 mg        500 mg Oral  Once 03/26/23 0600 03/26/23 0612       Subjective: Patient seen and examined at bedside today.  Pain well-controlled.  Denies any abdomen pain or pain in the right hip today.  No visible bruises or wounds on the right hip area.  Remains weak, mostly lying in bed.  Waiting for PT/OT evaluation.  Alert and oriented.  Objective: Vitals:   03/26/23 1847 03/26/23 2051 03/27/23 0433 03/27/23 0816  BP:  (!) 110/49 (!) 111/52 (!) 107/55  Pulse:  63 62 67  Resp:  19 18   Temp:  99.1 F (37.3 C) 98.2 F (36.8 C) 98.2 F (36.8 C)  TempSrc:  Oral Oral   SpO2:  92% 93% 95%  Weight: 89.5 kg     Height:        Intake/Output Summary (Last 24 hours) at 03/27/2023 1610 Last data filed at 03/26/2023 1732 Gross per 24 hour  Intake 243 ml  Output 200 ml  Net 43 ml   Filed Weights   03/26/23 1847  Weight: 89.5 kg    Examination:  General exam: Overall comfortable, not in distress, pleasant elderly male HEENT: PERRL Respiratory system:  no wheezes or crackles  Cardiovascular system: Irregular irregular rhythm Gastrointestinal system: Abdomen is nondistended, soft and nontender. Central nervous system: Alert and oriented Extremities: No edema, no clubbing ,no cyanosis Skin: No rashes, no ulcers,no icterus     Data Reviewed: I have personally reviewed following labs and imaging studies  CBC: Recent Labs  Lab 03/25/23 2025 03/27/23 0636  WBC 14.2* 13.8*  NEUTROABS 12.0*  --   HGB 10.9* 9.7*  HCT 35.4* 29.9*  MCV 85.5 82.8  PLT 555* 475*   Basic Metabolic Panel: Recent Labs  Lab  03/25/23 2025 03/27/23 0636  NA 133* 134*  K 3.7 4.3  CL 105 105  CO2 21* 20*  GLUCOSE 176* 101*  BUN 32* 37*  CREATININE 1.96* 2.25*  CALCIUM 8.1* 8.1*  MG 2.1  --      Recent Results (from the past 240 hour(s))  Respiratory (~20 pathogens) panel by PCR     Status: None   Collection Time: 03/26/23  9:47 AM   Specimen: Nasopharyngeal Swab; Respiratory  Result Value Ref Range Status   Adenovirus NOT DETECTED NOT DETECTED Final   Coronavirus 229E NOT DETECTED NOT DETECTED Final    Comment: (NOTE) The Coronavirus on the Respiratory Panel, DOES NOT test for the novel  Coronavirus (2019 nCoV)    Coronavirus HKU1 NOT DETECTED NOT DETECTED Final   Coronavirus NL63 NOT DETECTED NOT DETECTED Final   Coronavirus OC43 NOT DETECTED NOT DETECTED Final   Metapneumovirus NOT DETECTED NOT DETECTED Final   Rhinovirus / Enterovirus NOT DETECTED NOT DETECTED Final   Influenza A NOT DETECTED NOT DETECTED Final   Influenza B NOT DETECTED NOT DETECTED Final  Parainfluenza Virus 1 NOT DETECTED NOT DETECTED Final   Parainfluenza Virus 2 NOT DETECTED NOT DETECTED Final   Parainfluenza Virus 3 NOT DETECTED NOT DETECTED Final   Parainfluenza Virus 4 NOT DETECTED NOT DETECTED Final   Respiratory Syncytial Virus NOT DETECTED NOT DETECTED Final   Bordetella pertussis NOT DETECTED NOT DETECTED Final   Bordetella Parapertussis NOT DETECTED NOT DETECTED Final   Chlamydophila pneumoniae NOT DETECTED NOT DETECTED Final   Mycoplasma pneumoniae NOT DETECTED NOT DETECTED Final    Comment: Performed at [facility], [address] [place], [place] [address]  SARS Coronavirus 2 by RT PCR (hospital order, performed in [facility] hospital lab) *cepheid single result test* Anterior Nasal Swab     Status: None   Collection Time: 03/26/23  9:47 AM   Specimen: Anterior Nasal Swab  Result Value Ref Range Status   SARS Coronavirus 2 by RT PCR NEGATIVE NEGATIVE Final    Comment: Performed at [facility], [address] [place], [place] [address]     Radiology Studies: VAS [place] LOWER EXTREMITY VENOUS (DVT)  Result Date: 03/26/2023  Lower Venous DVT Study Patient Name:  [name] [name]  Date of Exam:   03/26/2023 Medical Rec #: [id number]         Accession #:    [id number] Date of Birth: 1945/12/26         Patient Gender: M Patient Age:   13 years Exam Location:  [facility] Procedure:      VAS [place] LOWER EXTREMITY VENOUS (DVT) Referring Phys: Orpha Bur FOUST --------------------------------------------------------------------------------  Indications: Swelling, Edema, and A-fib.  Comparison Study: No prior exam. Performing Technologist: Fernande Bras  Examination Guidelines: A complete evaluation includes B-mode imaging, spectral Doppler, color Doppler, and power Doppler as needed of all accessible portions of each vessel. Bilateral testing is considered an integral part of a complete examination. Limited examinations for reoccurring indications may be performed as noted. The reflux portion of the exam is performed with the patient in reverse Trendelenburg.  +---------+---------------+---------+-----------+----------+--------------+ RIGHT    CompressibilityPhasicitySpontaneityPropertiesThrombus Aging +---------+---------------+---------+-----------+----------+--------------+ CFV      Full           Yes      Yes                                 +---------+---------------+---------+-----------+----------+--------------+ SFJ      Full                                                        +---------+---------------+---------+-----------+----------+--------------+ FV Prox  Full                                                        +---------+---------------+---------+-----------+----------+--------------+ FV Mid   Full                                                        +---------+---------------+---------+-----------+----------+--------------+  FV DistalFull                                                         +---------+---------------+---------+-----------+----------+--------------+ PFV      Full                                                        +---------+---------------+---------+-----------+----------+--------------+ POP      Full           Yes      Yes                                 +---------+---------------+---------+-----------+----------+--------------+ PTV      Full                                                        +---------+---------------+---------+-----------+----------+--------------+ PERO     Full                                                        +---------+---------------+---------+-----------+----------+--------------+   +----+---------------+---------+-----------+----------+--------------+ LEFTCompressibilityPhasicitySpontaneityPropertiesThrombus Aging +----+---------------+---------+-----------+----------+--------------+ CFV Full           Yes      Yes                                 +----+---------------+---------+-----------+----------+--------------+     Summary: RIGHT: - There is no evidence of deep vein thrombosis in the lower extremity.  - No cystic structure found in the popliteal fossa.  LEFT: - No evidence of common femoral vein obstruction.   *See table(s) above for measurements and observations.    Preliminary    CT CHEST ABDOMEN PELVIS W CONTRAST  Result Date: 03/26/2023 CLINICAL DATA:  Retroperitoneal hematoma, fall, chronic anticoagulation EXAM: CT CHEST, ABDOMEN, AND PELVIS WITH CONTRAST TECHNIQUE: Multidetector CT imaging of the chest, abdomen and pelvis was performed following the standard protocol during bolus administration of intravenous contrast. RADIATION DOSE REDUCTION: This exam was performed according to the departmental dose-optimization program which includes automated exposure control, adjustment of the mA and/or kV according to patient size and/or use of  iterative reconstruction technique. CONTRAST:  80mL OMNIPAQUE IOHEXOL 350 MG/ML SOLN COMPARISON:  CT pelvis 04/16/2019 FINDINGS: CT CHEST FINDINGS Cardiovascular: Global cardiac size within normal limits. 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 Left subclavian pacemaker leads are seen within the right atrium and right ventricle. Coronary artery bypass grafting has been performed. No pericardial effusion. Central pulmonary arteries are enlarged in keeping with changes of pulmonary arterial hypertension. Extensive atherosclerotic calcification within the thoracic aorta. No aortic aneurysm. Mediastinum/Nodes: 15 mm nodule within the right thyroid  gland is indeterminate, not well characterized on this examination. Numerous pathologically enlarged mediastinal lymph nodes are identified measuring up to 19 mm in short axis diameter within the subcarinal lymph node groups. These may be reactive, inflammatory, or lymphoproliferative in nature. Lungs/Pleura: Moderate emphysema. Small bilateral pleural effusions are present with associated bibasilar compressive atelectasis. Fluid within the right major fissure noted. There is superimposed multifocal ground-glass pulmonary infiltrate within the upper lobes bilaterally, new within the lung apices to prior CT examination 03/04/2023, more focal within the left upper lobe in keeping with acute infection in the appropriate clinical setting. More solid-appearing 16 mm nodule within the basilar right middle lobe axial image # 120/5 is indeterminate. No pneumothorax. No central obstructing lesion. Musculoskeletal: No acute bone abnormality. No lytic or blastic bone lesion. CT ABDOMEN PELVIS FINDINGS Hepatobiliary: No focal liver abnormality is seen. Status post cholecystectomy. No biliary dilatation. Pancreas: Unremarkable Spleen: Unremarkable Adrenals/Urinary Tract: The adrenal  glands are unremarkable. The kidneys are normal in position. There is marked cortical atrophy of the right kidney which demonstrates delayed and decreased relative perfusion. The left kidney is normal in size. No hydronephrosis. No definite intrarenal or ureteral calculi. The bladder is unremarkable. Stomach/Bowel: Moderate descending and sigmoid colonic diverticulosis. Stomach, small bowel, and large bowel are otherwise unremarkable. Appendix normal. No free intraperitoneal gas or fluid. Vascular/Lymphatic: Extensive aortoiliac atherosclerotic calcification. No aortic aneurysm. No pathologic adenopathy within the abdomen and pelvis. Reproductive: Prostate is unremarkable. Other: Multilocular retroperitoneal acute to subacute hematoma is seen with a component noted superiorly along the posterolateral abdominal wall measuring 4.4 x 2.6 cm at axial image # 87/3. There is intramuscular hemorrhage within the right iliacus muscle with the dominant collection measuring 4.5 x 8.1 cm at axial image # 104/3. Intramuscular hemorrhage is seen within the distal iliopsoas muscle extending to level of the lesser trochanter. No active extravasation identified. Extensive circumferential subcutaneous edema is seen in the proximal right. Musculoskeletal: Osseous structures are age-appropriate. No acute bone abnormality. No lytic or blastic bone lesion. IMPRESSION: 1. Multilocular acute to subacute retroperitoneal hematoma with a component noted superiorly along the posterolateral abdominal wall measuring 4.4 x 2.6 cm. Intramuscular hemorrhage within the right iliacus muscle with the dominant collection measuring 4.5 x 8.1 cm. Intramuscular hemorrhage also noted within the distal iliopsoas muscle extending to level of the lesser trochanter. No active extravasation identified. 2. Small bilateral pleural effusions with associated bibasilar compressive atelectasis. 3. Multifocal ground-glass pulmonary infiltrate within the upper lobes  bilaterally 7 port in keeping with acute infection in the appropriate clinical setting. 4. 16 mm nodule within the basilar right middle lobe, indeterminate. Consider one of the following in 3 months for both low-risk and high-risk individuals: (a) repeat chest CT, (b) follow-up PET-CT, or (c) tissue sampling. This recommendation follows the consensus statement: Guidelines for Management of Incidental Pulmonary Nodules Detected on CT Images: From the Fleischner Society 2017; Radiology 2017; 284:228-243. 5. Marked cortical atrophy of the right kidney 6. Moderate distal colonic diverticulosis. 7. 15 mm nodule within the right thyroid gland, not well characterized on this examination. In the setting of significant comorbidities or limited life expectancy, no follow-up recommended (ref: J Am Coll Radiol. 2015 Feb;12(2): 143-50). Aortic Atherosclerosis (ICD10-I70.0) and Emphysema (ICD10-J43.9). Electronically Signed   By: Helyn Numbers M.D.   On: 03/26/2023 04:08   CT HIP RIGHT WO CONTRAST  Result Date: 03/26/2023 CLINICAL DATA:  Larey Seat 2 weeks ago. Progressive worsening right hip pain. EXAM: CT OF THE RIGHT HIP WITHOUT CONTRAST TECHNIQUE:  Multidetector CT imaging of the right hip was performed according to the standard protocol. Multiplanar CT image reconstructions were also generated. RADIATION DOSE REDUCTION: This exam was performed according to the departmental dose-optimization program which includes automated exposure control, adjustment of the mA and/or kV according to patient size and/or use of iterative reconstruction technique. COMPARISON:  Radiograph 03/25/2023 FINDINGS: Bones/Joint/Cartilage No acute fracture or dislocation.  Degenerative arthritis right hip. Ligaments Suboptimally assessed by CT. Muscles and Tendons Retroperitoneal low to intermediate density fluid collection extending into the distal right psoas and right iliacus muscle compatible with retroperitoneal hematoma. This measures 7.6 x 5.8  cm. Soft tissues Nonspecific edema in the proximal right thigh. Vascular calcifications. IMPRESSION: 1. No acute fracture or dislocation. 2. Retroperitoneal hematoma extending into the distal right psoas and right iliacus muscle. Electronically Signed   By: Minerva Fester M.D.   On: 03/26/2023 01:33   DG Hip Unilat W or Wo Pelvis 2-3 Views Right  Result Date: 03/25/2023 CLINICAL DATA:  Hip pain EXAM: DG HIP (WITH OR WITHOUT PELVIS) 2-3V RIGHT COMPARISON:  Right hip x-ray 03/05/2023 FINDINGS: The bones are mildly osteopenic. There is no acute fracture or dislocation. There are moderate degenerative changes of both hips similar to the prior study. Peripheral vascular calcifications are present. IMPRESSION: 1. No acute fracture or dislocation. 2. Moderate degenerative changes of both hips. Electronically Signed   By: Darliss Cheney M.D.   On: 03/25/2023 22:05    Scheduled Meds:  amiodarone  200 mg Oral Daily   guaiFENesin  600 mg Oral BID   insulin aspart  0-5 Units Subcutaneous QHS   insulin aspart  0-6 Units Subcutaneous TID WC   oxymetazoline  1 spray Each Nare QHS   sodium chloride flush  3 mL Intravenous Q12H   torsemide  20 mg Oral Daily   Continuous Infusions:  azithromycin 500 mg (03/27/23 0803)   cefTRIAXone (ROCEPHIN)  IV 1 g (03/27/23 0627)     LOS: 1 day   Burnadette Pop, MD [facility] P11/27/2024, 9:27 AM

## 2023-03-28 ENCOUNTER — Inpatient Hospital Stay (HOSPITAL_COMMUNITY): Payer: Medicare Other

## 2023-03-28 DIAGNOSIS — K683 Retroperitoneal hematoma: Secondary | ICD-10-CM | POA: Diagnosis not present

## 2023-03-28 DIAGNOSIS — I5023 Acute on chronic systolic (congestive) heart failure: Secondary | ICD-10-CM

## 2023-03-28 DIAGNOSIS — I4892 Unspecified atrial flutter: Secondary | ICD-10-CM | POA: Diagnosis not present

## 2023-03-28 LAB — COMPREHENSIVE METABOLIC PANEL
ALT: 75 U/L — ABNORMAL HIGH (ref 0–44)
AST: 92 U/L — ABNORMAL HIGH (ref 15–41)
Albumin: 2 g/dL — ABNORMAL LOW (ref 3.5–5.0)
Alkaline Phosphatase: 99 U/L (ref 38–126)
Anion gap: 12 (ref 5–15)
BUN: 48 mg/dL — ABNORMAL HIGH (ref 8–23)
CO2: 18 mmol/L — ABNORMAL LOW (ref 22–32)
Calcium: 8.1 mg/dL — ABNORMAL LOW (ref 8.9–10.3)
Chloride: 101 mmol/L (ref 98–111)
Creatinine, Ser: 3.13 mg/dL — ABNORMAL HIGH (ref 0.61–1.24)
GFR, Estimated: 20 mL/min — ABNORMAL LOW (ref >60)
Glucose, Bld: 119 mg/dL — ABNORMAL HIGH (ref 70–99)
Potassium: 4.1 mmol/L (ref 3.5–5.1)
Sodium: 131 mmol/L — ABNORMAL LOW (ref 135–145)
Total Bilirubin: 0.9 mg/dL (ref <1.2)
Total Protein: 5.5 g/dL — ABNORMAL LOW (ref 6.5–8.1)

## 2023-03-28 LAB — CBC
HCT: 30.4 % — ABNORMAL LOW (ref 39.0–52.0)
Hemoglobin: 9.8 g/dL — ABNORMAL LOW (ref 13.0–17.0)
MCH: 26.8 pg (ref 26.0–34.0)
MCHC: 32.2 g/dL (ref 30.0–36.0)
MCV: 83.3 fL (ref 80.0–100.0)
Platelets: 463 10*3/uL — ABNORMAL HIGH (ref 150–400)
RBC: 3.65 MIL/uL — ABNORMAL LOW (ref 4.22–5.81)
RDW: 17.4 % — ABNORMAL HIGH (ref 11.5–15.5)
WBC: 13.7 10*3/uL — ABNORMAL HIGH (ref 4.0–10.5)
nRBC: 0 % (ref 0.0–0.2)

## 2023-03-28 LAB — LACTIC ACID, PLASMA
Lactic Acid, Venous: 1.4 mmol/L (ref 0.5–1.9)
Lactic Acid, Venous: 1.2 mmol/L (ref 0.5–1.9)

## 2023-03-28 LAB — GLUCOSE, CAPILLARY
Glucose-Capillary: 140 mg/dL — ABNORMAL HIGH (ref 70–99)
Glucose-Capillary: 119 mg/dL — ABNORMAL HIGH (ref 70–99)
Glucose-Capillary: 136 mg/dL — ABNORMAL HIGH (ref 70–99)
Glucose-Capillary: 153 mg/dL — ABNORMAL HIGH (ref 70–99)

## 2023-03-28 LAB — BRAIN NATRIURETIC PEPTIDE: B Natriuretic Peptide: 2038.1 pg/mL — ABNORMAL HIGH (ref 0.0–100.0)

## 2023-03-28 MED ORDER — FUROSEMIDE 10 MG/ML IJ SOLN
60.0000 mg | Freq: Once | INTRAMUSCULAR | Status: AC
  Administered 2023-03-28: 60 mg via INTRAVENOUS

## 2023-03-28 MED ORDER — MIDODRINE HCL 5 MG PO TABS
10.0000 mg | ORAL_TABLET | Freq: Three times a day (TID) | ORAL | Status: DC
  Administered 2023-03-28 – 2023-04-08 (×34): 10 mg via ORAL
  Filled 2023-03-28 (×34): qty 2

## 2023-03-28 MED ORDER — MIDODRINE HCL 5 MG PO TABS: 5.0000 mg | ORAL_TABLET | Freq: Three times a day (TID) | ORAL | Status: DC

## 2023-03-28 MED ORDER — SODIUM BICARBONATE 650 MG PO TABS
650.0000 mg | ORAL_TABLET | Freq: Two times a day (BID) | ORAL | Status: DC
  Administered 2023-03-28 – 2023-04-08 (×23): 650 mg via ORAL
  Filled 2023-03-28 (×23): qty 1

## 2023-03-28 MED ORDER — FUROSEMIDE 10 MG/ML IJ SOLN: 60.0000 mg | Freq: Once | INTRAMUSCULAR | Status: DC

## 2023-03-28 MED ORDER — FUROSEMIDE 10 MG/ML IJ SOLN
60.0000 mg | Freq: Two times a day (BID) | INTRAMUSCULAR | Status: DC
  Filled 2023-03-28: qty 6

## 2023-03-28 NOTE — Consult Note (Signed)
Cardiology Consultation   Patient ID: [name] [name] MRN: [id number]; DOB: 1945-06-27  Admit date: 03/25/2023 Date of Consult: 03/28/2023  PCP:  [name] Banner, MD   [name] [name] Cardiologist:  [name] Meres, MD  Electrophysiologist:  [name] Manges, MD  Advanced Heart Failure:  [name] Meres, MD     Patient Profile:   [name] [name] is a 77 y.o. male with a hx of COPD, type 2 DM, CAD s/p CABG in 2018, chronic systolic HF/ischemic cardiomyopathy, HTN, history of bladder caner, paroxysmal atrial fibrillation who is being seen 03/28/2023 for the evaluation of CHF, fall at the request of Dr. Renford [name]   History of Present Illness:   [name] [name] is a 77 year old male with above medical history who is followed by Dr. Gala [name] with advanced heart failure and Dr. Graciela [name] with EP.   Patient was previously admitted in 03/2017 with anterior STEMI. Developed cardiogenic shock requiring Impella Support. Underwent cardiac catheterization that showed severe 3V CAD. While on impella, patient became septic, urine culture was positive for psuedomonas. Patient was stabilites, underwent CABG x2 with LIMA-LAD and SVG-OM on 04/08/17. He had atrial fibrillation post-op and was treated with amiodarone. There was no recurrence of afib. Echocardiogram from 03/2017 showed EF 20-25%.   A few months later, echocardiogram from 08/07/17 showed EF 35-40%, with mild LVH, a large laminated thrombus of the distal septum and apex, moderate-severe MR. Underwent cardiac MRI on 10/16/17 that showed EF 30%, laminated apical thrombus, normal RV size and function. LGE pattern suggestive of infarction in the LAD territory that was unlikely to improve with revascularization. Patient was referred to Dr. Graciela [name] and had a [employer]onic ICD implanted on 11/20/17.   Patient was later seen by Dr. Graciela [name] in 12/2019, and he was found to be in afib with RVR. Was on eliquis. Echocardiogram in 10/2020 showed EF 20-25%,  mildly reduced RV function, no LV clot noted.   He was admitted in 07/2021 with atrial flutter with RVR. He underwent DCCV with restoration of NSR. However, he presented back to the ED the next day with acute on chronic CHF. When he was seen in the clinic for a post-hospital follow up, patient was volume overloaded and was back in atrial flutter with RVR. He was started on amiodarone and was set up for an outpatient DCCV. Underwent DCCV on 08/10/21 with successful restoration of NSR.   Patient underwent echocardiogram on 09/26/22 that showed EF 25-30%, global hypokinesis, grade II DD, mildly reduced RV function, mildly elevated PA systolic pressure, trivial MR. Patient continued to have recurrent atrial flutter and volume overload. Underwent DCCV to NSR on 11/09/22. Later underwent CCM implant on 01/04/23  Patient was last seen by Dr. Gala [name] on 01/08/23. At that time, patient was feeling better after CCM implant. Had more energy, no device complications. Was able to ride the stationary bike. He had some dizziness, so his entresto was decreased to 49/51 mg BID. Of note, patient was not on SGLT2i due to history of yeast infections. Not on MRA due to gynecomastia with spironolactone, eplerenone due to hyperkalemia. Not on BB due to history of bradycardia. He was in atrial flutter, and his amiodarone was increased to 200 mg BID. Remained on eliquis. Considered DCCV, but his eliquis had been held for CCM imiplant, so this was deferred.   Patient was admitted from 11/4-11/8/24 with SIRS, syncope. Patient had been having flu-like symptoms for several days prior to his admission, then had a syncopal episode on  11/4. Device interrogation revealed persistent atrial flutter that had been ongoing since 12/2022. Patient underwent TEE guided DCCV on 11/7 with successful conversion to NSR. Was discharged on amiodarone 200 mg daily, eliquis  Patient presented to the ED on 11/25 complaining of right hip pain, swelling. Patient  reported that he had fallen 2 weeks prior, and was checked out in the ED but sent home. Since then, his right hip had been becoming more painful and swollen. Initial vital signs in the ED showed temperature 36.7C, BP 117/52, HR 66 BPM, oxygen 96% on room air. Labs showed WBC 14.2, creatinine 1.96, hemoglobin 10.9, platelets 555. CT hip showed evidence of retroperitoneal hematoma extending into the distal right psoas and right iliacus muscle. CT chest showed multifocal groundglass opacities in bilateral apices.   Patient was admitted to the [facility] service for treatment of retroperitoneal hematoma and community acquired pneumonia. His home eliquis was held due to hematoma. His entresto was held due to low BP. He was seen by PT and is being considered for CIR candidacy.   On 11/28, there was concern that patient was a bit volume overloaded. Cardiology was consulted. Of note, with patient's low BP, diuretics had been held.   On interview, patient reports that he fell about 2 weeks ago. Reports that he did lose consciousness during the fall. He was seen in the ED and prescribed pain medication. When first discharged from the ED, he felt pretty well. However, over the next two weeks he developed worsening pain and swelling in his right hip. Currently, he denies being in any pain. He notes that his right foot feels pretty swollen, and he does feel like he is holding onto extra fluid. His breathing feels "ok", but he is requiring 3 L supplemental oxygen. Denies dizziness, syncope, near syncope. Denies palpitations. Notes that he did not urinate very much yesterday, and he has not urinated at all today. Did not receive lasix this AM because his BP was low.     Past Medical History:  Diagnosis Date   AICD (automatic cardioverter/defibrillator) present    Arthritis    Bladder cancer (HCC)    Borderline glaucoma    Chronic systolic (congestive) heart failure (HCC)    Coronary artery disease    Diabetes mellitus  type 2, controlled (HCC) ORAL MED   Essential hypertension    Heart murmur    Hepatitis    Medical history non-contributory    Paroxysmal A-fib (HCC)    Paroxysmal A-fib (HCC) 08/20/2020   Peripheral vascular disease (HCC)    Pneumonia     Past Surgical History:  Procedure Laterality Date   CARDIOVERSION N/A 08/10/2021   Procedure: CARDIOVERSION;  Surgeon: Dolores Patty, MD;  Location: [name] ENDOSCOPY;  Service: Cardiovascular;  Laterality: N/A;   CARDIOVERSION N/A 11/09/2022   Procedure: CARDIOVERSION;  Surgeon: Maisie Fus, MD;  Location: [facility] INVASIVE CV LAB;  Service: Cardiovascular;  Laterality: N/A;   CARDIOVERSION N/A 03/07/2023   Procedure: CARDIOVERSION (CATH LAB);  Surgeon: Tessa Lerner, DO;  Location: [facility] INVASIVE CV LAB;  Service: Cardiovascular;  Laterality: N/A;   CAROTID DUPLEX SCAN  07-26-2010   BILATERAL ICA  STENOSIS 1% - 39%   CCM GENERATOR INSERTION N/A 01/04/2023   Procedure: CCM GENERATOR INSERTION;  Surgeon: Duke Salvia, MD;  Location: [facility] INVASIVE CV LAB;  Service: Cardiovascular;  Laterality: N/A;   CHOLECYSTECTOMY N/A 08/22/2020   Procedure: LAPAROSCOPIC CHOLECYSTECTOMY;  Surgeon: Emelia Loron, MD;  Location: [facility] OR;  Service: General;  Laterality: N/A;   CORONARY ARTERY BYPASS GRAFT N/A 04/08/2017   Procedure: CORONARY ARTERY BYPASS GRAFTING (CABG) TIMES TWO USING LEFT INTERNAL MAMMARY ARTERY AND LEFT SAPHENOUS LEG VEIN HARVESTED ENDOSCOPICALLY.  LEG VEIN ALSO HARVESTED FROM THE RIGHT LEG;  Surgeon: Alleen Borne, MD;  Location: [facility] OR;  Service: Open Heart Surgery;  Laterality: N/A;   CYSTOSCOPY WITH BIOPSY N/A 08/01/2012   Procedure: CYSTOSCOPY WITH BIOPSY BLADDER BIOPSY   ;  Surgeon: Antony Haste, MD;  Location: [facility];  Service: Urology;  Laterality: N/A;   ERCP N/A 08/21/2020   Procedure: ENDOSCOPIC RETROGRADE CHOLANGIOPANCREATOGRAPHY (ERCP);  Surgeon: Jeani Hawking, MD;  Location: [facility] ENDOSCOPY;  Service: Endoscopy;   Laterality: N/A;   FULGURATION OF BLADDER TUMOR N/A 08/01/2012   Procedure: FULGURATION OF BLADDER TUMOR;  Surgeon: Antony Haste, MD;  Location: [facility];  Service: Urology;  Laterality: N/A;   ICD IMPLANT  11/20/2017   ICD IMPLANT N/A 11/20/2017   Procedure: ICD IMPLANT;  Surgeon: Duke Salvia, MD;  Location: [facility] INVASIVE CV LAB;  Service: Cardiovascular;  Laterality: N/A;   LEFT HEART CATH AND CORONARY ANGIOGRAPHY N/A 03/31/2017   Procedure: LEFT HEART CATH AND CORONARY ANGIOGRAPHY;  Surgeon: Marykay Lex, MD;  Location: [facility] INVASIVE CV LAB;  Service: Cardiovascular;  Laterality: N/A;   LUMBAR LAMINECTOMY/DECOMPRESSION MICRODISCECTOMY Bilateral 06/16/2019   Procedure: Bilateral Lumbar Four-Five Laminectomy and Foraminotomy;  Surgeon: Julio Sicks, MD;  Location: [facility] OR;  Service: Neurosurgery;  Laterality: Bilateral;  posterior   REMOVAL OF STONES  08/21/2020   Procedure: REMOVAL OF STONES;  Surgeon: Jeani Hawking, MD;  Location: [facility] ENDOSCOPY;  Service: Endoscopy;;   RIGHT HEART CATH Right 03/31/2017   Procedure: RIGHT HEART CATH;  Surgeon: Marykay Lex, MD;  Location: [facility] INVASIVE CV LAB;  Service: Cardiovascular;  Laterality: Right;   SPHINCTEROTOMY  08/21/2020   Procedure: SPHINCTEROTOMY;  Surgeon: Jeani Hawking, MD;  Location: [facility] ENDOSCOPY;  Service: Endoscopy;;   TEE WITHOUT CARDIOVERSION N/A 04/08/2017   Procedure: TRANSESOPHAGEAL ECHOCARDIOGRAM (TEE);  Surgeon: Alleen Borne, MD;  Location: [facility] OR;  Service: Open Heart Surgery;  Laterality: N/A;   TRANSESOPHAGEAL ECHOCARDIOGRAM (CATH LAB) N/A 03/07/2023   Procedure: TRANSESOPHAGEAL ECHOCARDIOGRAM;  Surgeon: Tessa Lerner, DO;  Location: [facility] INVASIVE CV LAB;  Service: Cardiovascular;  Laterality: N/A;   TRANSURETHRAL RESECTION OF BLADDER TUMOR  05/29/2011   Procedure: TRANSURETHRAL RESECTION OF BLADDER TUMOR (TURBT);  Surgeon: Antony Haste, MD;  Location: [facility];  Service: Urology;   Laterality: N/A;   VENTRICULAR ASSIST DEVICE INSERTION Right 03/31/2017   Procedure: VENTRICULAR ASSIST DEVICE INSERTION;  Surgeon: Marykay Lex, MD;  Location: [facility] INVASIVE CV LAB;  Service: Cardiovascular;  Laterality: Right;     Home Medications:  Prior to Admission medications   Medication Sig Start Date End Date Taking? Authorizing Provider  acetaminophen (TYLENOL) 500 MG tablet Take 1,000 mg by mouth every 6 (six) hours as needed for mild pain (pain score 1-3), headache or fever.   Yes [provider]  amiodarone (PACERONE) 200 MG tablet Take 1 tablet (200 mg total) by mouth daily. Patient taking differently: Take 200 mg by mouth 2 (two) times daily. 03/08/23  Yes Ghimire, Werner Lean, MD  atorvastatin (LIPITOR) 80 MG tablet TAKE 1 TABLET BY MOUTH EVERY DAY 07/27/22  Yes Bensimhon, Bevelyn Buckles, MD  cyanocobalamin (VITAMIN B12) 250 MCG tablet Take 500 mcg by mouth daily.   Yes [provider]  ELIQUIS 5 MG  TABS tablet TAKE 1 TABLET TWICE A DAY Patient taking differently: Take 5 mg by mouth 2 (two) times daily. 10/03/22  Yes Bensimhon, Bevelyn Buckles, MD  oxymetazoline (DRISTAN) 0.05 % nasal spray Place 1 spray into both nostrils at bedtime.   Yes [provider]  sacubitril-valsartan (ENTRESTO) 49-51 MG Take 1 tablet by mouth 2 (two) times daily. 01/08/23  Yes Bensimhon, Bevelyn Buckles, MD  torsemide (DEMADEX) 20 MG tablet Take 1 tablet (20 mg total) by mouth daily. 11/21/22  Yes Andrey Farmer, PA-C  zolpidem (AMBIEN) 10 MG tablet Take 5 mg by mouth at bedtime. 12/13/22  Yes [provider]    Inpatient Medications: Scheduled Meds:  amiodarone  200 mg Oral Daily   guaiFENesin  600 mg Oral BID   insulin aspart  0-5 Units Subcutaneous QHS   insulin aspart  0-6 Units Subcutaneous TID WC   midodrine  10 mg Oral TID WC   oxymetazoline  1 spray Each Nare QHS   sodium bicarbonate  650 mg Oral BID   sodium chloride flush  3 mL Intravenous Q12H   Continuous  Infusions:  cefTRIAXone (ROCEPHIN)  IV Stopped (03/28/23 0628)   PRN Meds: albuterol, benzonatate, HYDROmorphone (DILAUDID) injection, melatonin, ondansetron **OR** ondansetron (ZOFRAN) IV, oxyCODONE, polyethylene glycol, sodium chloride flush  Allergies:    Allergies  Allergen Reactions   Spironolactone Other (See Comments)    Painful gynecomastia   Semaglutide     Nausea and bloating   Tramadol Other (See Comments)    Sleep disturbance.  Apnea spells.   Empagliflozin Rash    Rash at the groin Jardiance   Niacin And Related Hives    Social History:   Social History   Socioeconomic History   Marital status: Married    Spouse name: Not on file   Number of children: Not on file   Years of education: Not on file   Highest education level: Not on file  Occupational History   Occupation: retired  Tobacco Use   Smoking status: Former    Current packs/day: 0.00    Average packs/day: 1 pack/day for 40.0 years (40.0 ttl pk-yrs)    Types: Cigarettes    Start date: 03/31/1977    Quit date: 03/31/2017    Years since quitting: 5.9   Smokeless tobacco: Never  Vaping Use   Vaping status: Never Used  Substance and Sexual Activity   Alcohol use: Not Currently    Comment: rarely   Drug use: No   Sexual activity: Not on file  Other Topics Concern   Not on file  Social History Narrative   Not on file   Social Determinants of Health   Financial Resource Strain: Not on file  Food Insecurity: No Food Insecurity (03/26/2023)   Hunger Vital Sign    Worried About Running Out of Food in the Last Year: Never true    Ran Out of Food in the Last Year: Never true  Transportation Needs: No Transportation Needs (03/26/2023)   PRAPARE - Administrator, Civil Service (Medical): No    Lack of Transportation (Non-Medical): No  Recent Concern: Transportation Needs - Unmet Transportation Needs (03/11/2023)   Received from [employer]    In the past 12 months,  has lack of reliable transportation kept you from medical appointments, meetings, work or from getting things needed for daily living? : Yes  Physical Activity: Inactive (07/11/2017)   Exercise Vital Sign    Days of Exercise  per Week: 0 days    Minutes of Exercise per Session: 0 min  Stress: No Stress Concern Present (07/11/2017)   [employer] of Occupational Health - Occupational Stress Questionnaire    Feeling of Stress : Only a little  Social Connections: Not on file  Intimate Partner Violence: Not At Risk (03/26/2023)   Humiliation, Afraid, Rape, and Kick questionnaire    Fear of Current or Ex-Partner: No    Emotionally Abused: No    Physically Abused: No    Sexually Abused: No    Family History:    Family History  Problem Relation Age of Onset   Heart attack Sister    Heart disease Sister      ROS:  Please see the history of present illness.   All other ROS reviewed and negative.     Physical Exam/Data:   Vitals:   03/28/23 0810 03/28/23 0950 03/28/23 1105 03/28/23 1143  BP: (!) 95/51 (!) 90/40 (!) 99/52 (!) 106/53  Pulse:   (!) 54 (!) 52  Resp: 19     Temp: 97.9 F (36.6 C)     TempSrc: Oral     SpO2: 92%     Weight:      Height:        Intake/Output Summary (Last 24 hours) at 03/28/2023 1500 Last data filed at 03/28/2023 1416 Gross per 24 hour  Intake 1270 ml  Output 100 ml  Net 1170 ml      03/26/2023    6:47 PM 03/04/2023    9:24 PM 01/16/2023    9:26 AM  Last 3 Weights  Weight (lbs) 197 lb 5 oz 201 lb 210 lb  Weight (kg) 89.5 kg 91.173 kg 95.255 kg     Body mass index is 25.33 kg/m.  General:  Thin elderly male. Sitting upright in the bed. Wearing [place]. No acute distress  HEENT: normal Neck: no JVD Vascular: Radial pulses 2+ bilaterally Cardiac:  normal S1, S2; Regular rhythm, bradycardic. No murmurs  Lungs:  crackles throughout, most notable in lung bases. Normal work of breathing on 3.5 L oxygen via [place]  Abd: soft, nontender  Ext: 2+  edema in RLE, trace edema in LLE  Musculoskeletal:  No deformities, BUE and BLE strength normal and equal Skin: warm and dry  Neuro:   no focal abnormalities noted Psych:  Normal affect   EKG:  The EKG was personally reviewed and demonstrates: EKG from 11/25 has significant baseline artifact. Appears to show NSR with nonspecific IVCD, HR 66 BPM  Telemetry:  Telemetry was personally reviewed and demonstrates:  Sinus bradycardia with HR in the upper 40s-50s. Occasional PVCs   Relevant CV Studies: Cardiac Studies & Procedures   CARDIAC CATHETERIZATION  CARDIAC CATHETERIZATION 03/31/2017  Narrative Images from the original result were not included.   Prox RCA lesion is 100% stenosed. Dist RCA lesion is 100% stenosed (retrograde filling from collaterals does not go beyond the distal vessel) -->faint collateral filling to the distal PL and PDA from septal perforators and a diffusely diseased AV groove circumflex  LAV Groove lesion is 90% stenosed.  Prox LAD-1 lesion is 95% stenosed. Prox LAD-2 lesion is 85% stenosed. Heavily calcified, tandem lesions in very tortuous segment of the vessel.  Prox Cx to Mid Cx lesion is 70% stenosed. Heavily calcified  There is severe left ventricular systolic dysfunction. The left ventricular ejection fraction is less than 25% by visual estimate.  LV end diastolic pressure is severely elevated -prior to Impella insertion  Hemodynamic findings consistent with moderate pulmonary hypertension.  Successful Impella insertion with 3.5-3.6 LPM flow's  The patient has severe native coronary disease with an occluded RCA and extensive calcified disease in the LAD including 2 severe lesions that are not favorable for PCI based on the tortuosity, heavily calcified segment before and after the lesions.  Likely not to get a good outcome without atherectomy which is dangerous given the tortuosity. There is also diffuse calcified disease in the circumflex that appears to  be at least 70%.  The patient has severe ischemic cardiomyopathy and presented in acute hypoxic respiratory failure with acute systolic and diastolic heart failure now stabilized on nasal cannula oxygen with Impella in place.  Relatively hemodynamically stable.  Bedside limited stat echocardiogram was performed in the Cath Lab to ensure adequate Impella placement.  Assistance with Impella device representative was appreciated.  Plan: After discussion with Dr. Gala [name] and reviewing the images, we both agreed that attempted PCI on a Sunday evening in this critically ill patient is potentially dangerous.  Admit to ICU/CCU with standard post Impella care and monitoring.  Diuresis Via Lasix drip.  Okay to insert Foley catheter  Cycle cardiac enzymes.  Plan will be to "tune up" with diuresis and Impella and contact CT surgery in the morning to consider CABG.  We will hold amlodipine and ACE inhibitor, but continue low-dose beta-blocker given his sinus tachycardia until his blood pressure stabilized.    David Harding, M.D., M.S. Interventional Cardiologist  Pager # [phone] Phone # [phone] [address] [place], [place] [address]  Findings Coronary Findings Diagnostic  Dominance: Right  Left Main Vessel is large.  Left Anterior Descending There is mild diffuse disease throughout the vessel. The vessel is severely calcified. The vessel is severely tortuous. From proximal to distal.  Most notable in the area of the significant lesions. Prox LAD-1 lesion is 95% stenosed. Prox LAD-2 lesion is 85% stenosed. The lesion is located proximal to the major branch, irregular, thrombotic and ulcerative.  First Diagonal Branch Vessel is small in size.  Second Diagonal Branch Vessel is moderate in size.  Left Circumflex Vessel is large. Prox Cx to Mid Cx lesion is 70% stenosed. The lesion is eccentric. The lesion is calcified.  First Obtuse Marginal Branch Vessel is  small in size.  Second Obtuse Marginal Branch  Third Obtuse Marginal Branch Vessel is large in size.  Left Posterior Atrioventricular Artery Vessel is small in size. LAV Groove lesion is 90% stenosed. The lesion is segmental and eccentric.  Right Coronary Artery Vessel is moderate in size. Prox RCA lesion is 100% stenosed. The lesion is located proximal to the major branch. Potentially CTO.  Cannot be sure Dist RCA lesion is 100% stenosed.  Right Posterior Descending Artery Vessel is moderate in size. There is moderate disease in the vessel. Collaterals RPDA filled by collaterals from 1st Sept.  Right Posterior Atrioventricular Artery There is moderate disease in the vessel. Collaterals RPAV filled by collaterals from LPAV.  Intervention  No interventions have been documented.     ECHOCARDIOGRAM  ECHOCARDIOGRAM COMPLETE 09/26/2022  Narrative ECHOCARDIOGRAM REPORT    Patient Name:   [name] [name] Date of Exam: 09/26/2022 Medical Rec #:  [id number]        Height:       74.0 in Accession #:    [id number]       Weight:       20 7.6 lb Date of Birth:  March 22, 1946  BSA:          2.208 m Patient Age:    77 years         BP:           142/70 mmHg Patient Gender: M                HR:           68 bpm. Exam Location:  Outpatient  Procedure: 2D Echo, Cardiac Doppler, Color Doppler and Intracardiac Opacification Agent  Indications:    Congestive Heart Failure I50.9  History:        Patient has prior history of Echocardiogram examinations, most recent 01/04/2022. CHF, CAD, Defibrillator, Arrythmias:Atrial Fibrillation; Risk Factors:Hypertension and Diabetes.  Sonographer:    Eulah Pont RDCS Referring Phys: [id number] DANIEL R BENSIMHON  IMPRESSIONS   1. Global hypokinesis with apical akinesis. Left ventricular ejection fraction, by estimation, is 25 to 30%. The left ventricle has severely decreased function. The left ventricle demonstrates global hypokinesis. The  left ventricular internal cavity size was mildly dilated. Left ventricular diastolic parameters are consistent with Grade II diastolic dysfunction (pseudonormalization). 2. Right ventricular systolic function is mildly reduced. The right ventricular size is normal. There is mildly elevated pulmonary artery systolic pressure. 3. Left atrial size was mildly dilated. 4. The mitral valve is normal in structure. Trivial mitral valve regurgitation. No evidence of mitral stenosis. 5. Left and right coronary cusps are partially fused. The aortic valve is tricuspid. There is mild calcification of the aortic valve. There is mild thickening of the aortic valve. Aortic valve regurgitation is trivial. No aortic stenosis is present. 6. The inferior vena cava is normal in size with greater than 50% respiratory variability, suggesting right atrial pressure of 3 mmHg.  FINDINGS Left Ventricle: Global hypokinesis with apical akinesis. Left ventricular ejection fraction, by estimation, is 25 to 30%. The left ventricle has severely decreased function. The left ventricle demonstrates global hypokinesis. Definity contrast agent was given IV to delineate the left ventricular endocardial borders. The left ventricular internal cavity size was mildly dilated. There is no left ventricular hypertrophy. Left ventricular diastolic parameters are consistent with Grade II diastolic dysfunction (pseudonormalization).   LV Wall Scoring: The mid and distal anterior wall, mid and distal anterior septum, entire apex, and mid and distal inferior wall are akinetic. The antero-lateral wall, posterior wall, basal anteroseptal segment, mid inferoseptal segment, basal anterior segment, basal inferior segment, and basal inferoseptal segment are hypokinetic.  Right Ventricle: The right ventricular size is normal. No increase in right ventricular wall thickness. Right ventricular systolic function is mildly reduced. There is mildly elevated  pulmonary artery systolic pressure. The tricuspid regurgitant velocity is 2.93 m/s, and with an assumed right atrial pressure of 3 mmHg, the estimated right ventricular systolic pressure is 37.3 mmHg.  Left Atrium: Left atrial size was mildly dilated.  Right Atrium: Right atrial size was normal in size.  Pericardium: There is no evidence of pericardial effusion.  Mitral Valve: The mitral valve is normal in structure. Trivial mitral valve regurgitation. No evidence of mitral valve stenosis.  Tricuspid Valve: The tricuspid valve is normal in structure. Tricuspid valve regurgitation is trivial. No evidence of tricuspid stenosis.  Aortic Valve: Left and right coronary cusps are partially fused. The aortic valve is tricuspid. There is mild calcification of the aortic valve. There is mild thickening of the aortic valve. Aortic valve regurgitation is trivial. No aortic stenosis is present.  Pulmonic Valve: The pulmonic valve was normal in structure.  Pulmonic valve regurgitation is trivial. No evidence of pulmonic stenosis.  Aorta: The aortic root is normal in size and structure.  Venous: The inferior vena cava is normal in size with greater than 50% respiratory variability, suggesting right atrial pressure of 3 mmHg.  IAS/Shunts: No atrial level shunt detected by color flow Doppler.   LEFT VENTRICLE PLAX 2D LVIDd:         6.10 cm      Diastology LVIDs:         4.40 cm      LV e' medial:  4.31 cm/s LV PW:         1.00 cm      LV e' lateral: 6.18 cm/s LV IVS:        1.00 cm LVOT diam:     2.20 cm LV SV:         63 LV SV Index:   29 LVOT Area:     3.80 cm  LV Volumes (MOD) LV vol d, MOD A2C: 198.0 ml LV vol d, MOD A4C: 218.0 ml LV vol s, MOD A2C: 144.0 ml LV vol s, MOD A4C: 146.0 ml LV SV MOD A2C:     54.0 ml LV SV MOD A4C:     218.0 ml LV SV MOD BP:      62.8 ml  RIGHT VENTRICLE TAPSE (M-mode): 1.5 cm  LEFT ATRIUM             Index        RIGHT ATRIUM           Index LA diam:         4.30 cm 1.95 cm/m   RA Area:     13.90 cm LA Vol (A2C):   66.9 ml 30.30 ml/m  RA Volume:   31.00 ml  14.04 ml/m LA Vol (A4C):   58.0 ml 26.27 ml/m LA Biplane Vol: 63.7 ml 28.85 ml/m AORTIC VALVE LVOT Vmax:   90.60 cm/s LVOT Vmean:  53.000 cm/s LVOT VTI:    0.167 m  AORTA Ao Root diam: 3.80 cm Ao Asc diam:  3.70 cm  MR Peak grad:    110.9 mmHg   TRICUSPID VALVE MR Mean grad:    70.5 mmHg    TR Peak grad:   34.3 mmHg MR Vmax:         526.50 cm/s  TR Vmax:        293.00 cm/s MR Vmean:        400.5 cm/s MR PISA:         0.57 cm     SHUNTS MR PISA Eff ROA: 4 mm        Systemic VTI:  0.17 m MR PISA Radius:  0.30 cm      Systemic Diam: 2.20 cm  Chilton Si MD Electronically signed by Chilton Si MD Signature Date/Time: 09/26/2022/10:48:26 AM    Final   TEE  ECHO TEE 03/07/2023  Narrative TRANSESOPHOGEAL ECHO REPORT    Patient Name:   [name] [name] Date of Exam: 03/07/2023 Medical Rec #:  [id number]        Height:       74.0 in Accession #:    [id number]       Weight:       201.0 lb Date of Birth:  1945/07/04        BSA:          2.178 m Patient Age:    78 years  BP:           129/69 mmHg Patient Gender: M                HR:           93 bpm. Exam Location:  Inpatient  Procedure: Transesophageal Echo, Cardiac Doppler and Color Doppler  Indications:    AFIB  History:        Patient has prior history of Echocardiogram examinations.  Sonographer:    Dondra Prader RVT RCS Referring Phys: [id number] Jeanella Craze  PROCEDURE: After discussion of the risks and benefits of a TEE, an informed consent was obtained from the patient. The transesophogeal probe was passed without difficulty through the esophogus of the patient. Imaged were obtained with the patient in a supine position. Sedation performed by different physician. Image quality was good. The patient's vital signs; including heart rate, blood pressure, and oxygen saturation; remained stable  throughout the procedure. Supplementary images were obtained from transthoracic windows as indicated to answer the clinical question. The patient developed no complications during the procedure. A successful direct current cardioversion was performed at 150 joules with 1 attempt.  IMPRESSIONS   1. Left ventricular ejection fraction, by estimation, is 20 to 25%. The left ventricle has severely decreased function. The left ventricular internal cavity size was dilated. 2. Right ventricular systolic function is mildly reduced. The right ventricular size is not well visualized. 3. No left atrial/left atrial appendage thrombus was detected. The LAA emptying velocity was 36 cm/s. 4. The mitral valve was not well visualized. 5. The aortic valve is normal in structure. Aortic valve regurgitation is trivial. Aortic valve sclerosis is present, with no evidence of aortic valve stenosis. 6. There is mild (Grade II) layered plaque involving the descending aorta. 7. The atrial septum is grossly normal by Color Doppler.  Conclusion(s)/Recommendation(s): No LA/LAA thrombus identified. Successful cardioversion performed with restoration of normal sinus rhythm.  FINDINGS Left Ventricle: The left ventricle demonstrates global hypokinesis and regional wall motion abnormalities.Left ventricular diastolic function could not be evaluated due to atrial flutter. Left ventricular ejection fraction, by estimation, is 20 to 25%. The left ventricle has severely decreased function. The left ventricular internal cavity size was dilated. Left ventricular diastolic function could not be evaluated due to atrial fibrillation.  Right Ventricle: The right ventricular size is not well visualized. Right vetricular wall thickness was not assessed. Right ventricular systolic function is mildly reduced.  Left Atrium: Left atrial size was not well visualized. No left atrial/left atrial appendage thrombus was detected. The LAA emptying  velocity was 36 cm/s.  Right Atrium: Right atrial size was not well visualized.  Pericardium: There is no evidence of pericardial effusion.  Mitral Valve: The mitral valve was not well visualized.  Tricuspid Valve: The tricuspid valve is normal in structure. Tricuspid valve regurgitation is mild . No evidence of tricuspid stenosis.  Aortic Valve: The aortic valve is normal in structure. Aortic valve regurgitation is trivial. Aortic valve sclerosis is present, with no evidence of aortic valve stenosis.  Pulmonic Valve: The pulmonic valve was grossly normal. Pulmonic valve regurgitation is not visualized. No evidence of pulmonic stenosis.  Aorta: Aortic root could not be assessed. There is mild (Grade II) layered plaque involving the descending aorta.  Venous: The inferior vena cava was not well visualized.  IAS/Shunts: The atrial septum is grossly normal by Color Doppler.  Additional Comments: A device lead is visualized. Spectral Doppler performed.  [employer] signed by [employer]  [place] Signature Date/Time: 03/07/2023/3:43:35 PM    Final     CARDIAC MRI  MR CARDIAC MORPHOLOGY W WO CONTRAST 10/16/2017  Narrative CLINICAL DATA:  Ischemic cardiomyopathy  EXAM: CARDIAC MRI  TECHNIQUE: The patient was scanned on a 1.5 Tesla GE magnet. A dedicated cardiac coil was used. Functional imaging was done using Fiesta sequences. 2,3, and 4 chamber views were done to assess for RWMA's. Modified Simpson's rule using a short axis stack was used to calculate an ejection fraction on a dedicated work [employer]. The patient received 30 cc of Multihance. After 10 minutes inversion recovery sequences were used to assess for infiltration and scar tissue.  FINDINGS: Limited images of the lung fields showed no gross abnormalities. S/p sternotomy.  Mildly dilated left ventricle with normal wall thickness, EF 30%. Mid to apical anterior and anteroseptal  akinesis. Apical lateral and apical inferior akinesis. Akinesis of the true apex. Laminated apical thrombus. Normal right ventricular size and systolic function. Mild left atrial enlargement. Normal right atrium. Trileaflet aortic valve with no stenosis or regurgitation. Visually, mitral regurgitation appeared mild. Flow sequences to quantify MR were not done.  Delayed enhancement imaging:  Anteroseptal wall: 76-99% wall thickness subendocardial late gadolinium enhancement (LGE) mid anteroseptum, near full thickness LGE apical septum.  Inferior wall: 76-99% wall thickness subendocardial LGE apical inferior wall.  Lateral wall: 76-99% wall thickness subendocardial LGE apical lateral wall.  Anterior wall: 51-75% wall thickness subendocardial LGE mid anterior wall, 76-99% wall thickness subendocardial LGE apical anterior wall.  Measurements:  LVEDV 303 mL  LVSV 90 mL  LVEF 30%  IMPRESSION: 1. Mildly dilated LV with wall motion abnormalities as noted above, EF 30%.  2.  Laminated apical thrombus.  3.  Normal RV size and systolic function.  4. LGE pattern as noted above, suggesting infarction in the LAD territory. The affected wall segments would be unlikely to improve with revascularization.  Dalton Mclean   Electronically Signed By: Marca Ancona M.D. On: 10/18/2017 23:19          Laboratory Data:  High Sensitivity Troponin:   Recent Labs  Lab 03/04/23 0015 03/04/23 2120  TROPONINIHS 39* 25*     Chemistry Recent Labs  Lab 03/25/23 2025 03/27/23 0636 03/28/23 0548  NA 133* 134* 131*  K 3.7 4.3 4.1  CL 105 105 101  CO2 21* 20* 18*  GLUCOSE 176* 101* 119*  BUN 32* 37* 48*  CREATININE 1.96* 2.25* 3.13*  CALCIUM 8.1* 8.1* 8.1*  MG 2.1  --   --   GFRNONAA 35* 29* 20*  ANIONGAP 7 9 12     Recent Labs  Lab 03/25/23 2025 03/28/23 0548  PROT 6.1* 5.5*  ALBUMIN 2.4* 2.0*  AST 74* 92*  ALT 85* 75*  ALKPHOS 115 99  BILITOT 1.1 0.9   Lipids No  results for input(s): "CHOL", "TRIG", "HDL", "LABVLDL", "LDLCALC", "CHOLHDL" in the last 168 hours.  Hematology Recent Labs  Lab 03/25/23 2025 03/27/23 0636 03/28/23 0548  WBC 14.2* 13.8* 13.7*  RBC 4.14* 3.61* 3.65*  HGB 10.9* 9.7* 9.8*  HCT 35.4* 29.9* 30.4*  MCV 85.5 82.8 83.3  MCH 26.3 26.9 26.8  MCHC 30.8 32.4 32.2  RDW 17.7* 17.4* 17.4*  PLT 555* 475* 463*   Thyroid No results for input(s): "TSH", "FREET4" in the last 168 hours.  BNP Recent Labs  Lab 03/28/23 0548  BNP 2,038.1*    DDimer No results for input(s): "DDIMER" in the last 168 hours.   Radiology/Studies:  DG CHEST PORT 1 VIEW  Result Date: 03/28/2023 CLINICAL DATA:  Short of breath. EXAM: PORTABLE CHEST 1 VIEW COMPARISON:  03/04/2023, 01/16/2023 and CT dated 03/26/2023. FINDINGS: Stable changes from prior cardiac surgery and stable cardiac enlargement. No mediastinal or hilar masses. Left and right anterior chest wall pacemakers/AICD are stable. Irregular interstitial opacities and mild hazy intervening airspace opacities are noted in the upper lobes, greater on the right. Minor basilar opacities are noted suspected to be atelectasis. Lungs are hyperexpanded but otherwise clear. No visualized pleural effusion.  No pneumothorax. IMPRESSION: 1. Interstitial hazy airspace lung opacities in the upper lobes most evident on the right. On the recent prior chest CT, this was more apparent on the left. Consider asymmetric edema versus pneumonia. 2. No visualized pleural effusions. These may be improved from the recent prior chest CT or not visualized due to the AP technique. 3. No new abnormalities.  Stable cardiomegaly. Electronically Signed   By: Amie Portland M.D.   On: 03/28/2023 11:14   VAS [place] LOWER EXTREMITY VENOUS (DVT)  Result Date: 03/27/2023  Lower Venous DVT Study Patient Name:  [name] [name]  Date of Exam:   03/26/2023 Medical Rec #: [id number]         Accession #:    [id number] Date of Birth: Dec 25, 1945          Patient Gender: M Patient Age:   51 years Exam Location:  [facility] Procedure:      VAS [place] LOWER EXTREMITY VENOUS (DVT) Referring Phys: Orpha Bur FOUST --------------------------------------------------------------------------------  Indications: Swelling, Edema, and A-fib.  Comparison Study: No prior exam. Performing Technologist: Fernande Bras  Examination Guidelines: A complete evaluation includes B-mode imaging, spectral Doppler, color Doppler, and power Doppler as needed of all accessible portions of each vessel. Bilateral testing is considered an integral part of a complete examination. Limited examinations for reoccurring indications may be performed as noted. The reflux portion of the exam is performed with the patient in reverse Trendelenburg.  +---------+---------------+---------+-----------+----------+--------------+ RIGHT    CompressibilityPhasicitySpontaneityPropertiesThrombus Aging +---------+---------------+---------+-----------+----------+--------------+ CFV      Full           Yes      Yes                                 +---------+---------------+---------+-----------+----------+--------------+ SFJ      Full                                                        +---------+---------------+---------+-----------+----------+--------------+ FV Prox  Full                                                        +---------+---------------+---------+-----------+----------+--------------+ FV Mid   Full                                                        +---------+---------------+---------+-----------+----------+--------------+ FV DistalFull                                                        +---------+---------------+---------+-----------+----------+--------------+  PFV      Full                                                        +---------+---------------+---------+-----------+----------+--------------+ POP      Full           Yes       Yes                                 +---------+---------------+---------+-----------+----------+--------------+ PTV      Full                                                        +---------+---------------+---------+-----------+----------+--------------+ PERO     Full                                                        +---------+---------------+---------+-----------+----------+--------------+   +----+---------------+---------+-----------+----------+--------------+ LEFTCompressibilityPhasicitySpontaneityPropertiesThrombus Aging +----+---------------+---------+-----------+----------+--------------+ CFV Full           Yes      Yes                                 +----+---------------+---------+-----------+----------+--------------+     Summary: RIGHT: - There is no evidence of deep vein thrombosis in the lower extremity.  - No cystic structure found in the popliteal fossa.  LEFT: - No evidence of common femoral vein obstruction.   *See table(s) above for measurements and observations. Electronically signed by Heath Lark on 03/27/2023 at 4:44:43 PM.    Final    CT CHEST ABDOMEN PELVIS W CONTRAST  Result Date: 03/26/2023 CLINICAL DATA:  Retroperitoneal hematoma, fall, chronic anticoagulation EXAM: CT CHEST, ABDOMEN, AND PELVIS WITH CONTRAST TECHNIQUE: Multidetector CT imaging of the chest, abdomen and pelvis was performed following the standard protocol during bolus administration of intravenous contrast. RADIATION DOSE REDUCTION: This exam was performed according to the departmental dose-optimization program which includes automated exposure control, adjustment of the mA and/or kV according to patient size and/or use of iterative reconstruction technique. CONTRAST:  80mL OMNIPAQUE IOHEXOL 350 MG/ML SOLN COMPARISON:  CT pelvis 04/16/2019 FINDINGS: CT CHEST FINDINGS Cardiovascular: Global cardiac size within normal limits. There is thinning of the left ventricular apical  wall and apical aneurysm formation in keeping with remote myocardial infarction in this location. Left subclavian pacemaker leads are seen within the right atrium and right ventricle. Coronary artery bypass grafting has been performed. No pericardial effusion. Central pulmonary arteries are enlarged in keeping with changes of pulmonary arterial hypertension. Extensive atherosclerotic calcification within the thoracic aorta. No aortic aneurysm. Mediastinum/Nodes: 15 mm nodule within the right thyroid gland is indeterminate, not well characterized on this examination. Numerous pathologically enlarged mediastinal lymph nodes are identified measuring up to 19 mm in short axis diameter within the subcarinal lymph node groups. These may be reactive, inflammatory, or lymphoproliferative in nature. Lungs/Pleura: Moderate emphysema. Small bilateral pleural effusions  are present with associated bibasilar compressive atelectasis. Fluid within the right major fissure noted. There is superimposed multifocal ground-glass pulmonary infiltrate within the upper lobes bilaterally, new within the lung apices to prior CT examination 03/04/2023, more focal within the left upper lobe in keeping with acute infection in the appropriate clinical setting. More solid-appearing 16 mm nodule within the basilar right middle lobe axial image # 120/5 is indeterminate. No pneumothorax. No central obstructing lesion. Musculoskeletal: No acute bone abnormality. No lytic or blastic bone lesion. CT ABDOMEN PELVIS FINDINGS Hepatobiliary: No focal liver abnormality is seen. Status post cholecystectomy. No biliary dilatation. Pancreas: Unremarkable Spleen: Unremarkable Adrenals/Urinary Tract: The adrenal glands are unremarkable. The kidneys are normal in position. There is marked cortical atrophy of the right kidney which demonstrates delayed and decreased relative perfusion. The left kidney is normal in size. No hydronephrosis. No definite intrarenal or  ureteral calculi. The bladder is unremarkable. Stomach/Bowel: Moderate descending and sigmoid colonic diverticulosis. Stomach, small bowel, and large bowel are otherwise unremarkable. Appendix normal. No free intraperitoneal gas or fluid. Vascular/Lymphatic: Extensive aortoiliac atherosclerotic calcification. No aortic aneurysm. No pathologic adenopathy within the abdomen and pelvis. Reproductive: Prostate is unremarkable. Other: Multilocular retroperitoneal acute to subacute hematoma is seen with a component noted superiorly along the posterolateral abdominal wall measuring 4.4 x 2.6 cm at axial image # 87/3. There is intramuscular hemorrhage within the right iliacus muscle with the dominant collection measuring 4.5 x 8.1 cm at axial image # 104/3. Intramuscular hemorrhage is seen within the distal iliopsoas muscle extending to level of the lesser trochanter. No active extravasation identified. Extensive circumferential subcutaneous edema is seen in the proximal right. Musculoskeletal: Osseous structures are age-appropriate. No acute bone abnormality. No lytic or blastic bone lesion. IMPRESSION: 1. Multilocular acute to subacute retroperitoneal hematoma with a component noted superiorly along the posterolateral abdominal wall measuring 4.4 x 2.6 cm. Intramuscular hemorrhage within the right iliacus muscle with the dominant collection measuring 4.5 x 8.1 cm. Intramuscular hemorrhage also noted within the distal iliopsoas muscle extending to level of the lesser trochanter. No active extravasation identified. 2. Small bilateral pleural effusions with associated bibasilar compressive atelectasis. 3. Multifocal ground-glass pulmonary infiltrate within the upper lobes bilaterally 7 port in keeping with acute infection in the appropriate clinical setting. 4. 16 mm nodule within the basilar right middle lobe, indeterminate. Consider one of the following in 3 months for both low-risk and high-risk individuals: (a) repeat  chest CT, (b) follow-up PET-CT, or (c) tissue sampling. This recommendation follows the consensus statement: Guidelines for Management of Incidental Pulmonary Nodules Detected on CT Images: From the Fleischner Society 2017; Radiology 2017; 284:228-243. 5. Marked cortical atrophy of the right kidney 6. Moderate distal colonic diverticulosis. 7. 15 mm nodule within the right thyroid gland, not well characterized on this examination. In the setting of significant comorbidities or limited life expectancy, no follow-up recommended (ref: J Am Coll Radiol. 2015 Feb;12(2): 143-50). Aortic Atherosclerosis (ICD10-I70.0) and Emphysema (ICD10-J43.9). Electronically Signed   By: Helyn Numbers M.D.   On: 03/26/2023 04:08   CT HIP RIGHT WO CONTRAST  Result Date: 03/26/2023 CLINICAL DATA:  Larey Seat 2 weeks ago. Progressive worsening right hip pain. EXAM: CT OF THE RIGHT HIP WITHOUT CONTRAST TECHNIQUE: Multidetector CT imaging of the right hip was performed according to the standard protocol. Multiplanar CT image reconstructions were also generated. RADIATION DOSE REDUCTION: This exam was performed according to the departmental dose-optimization program which includes automated exposure control, adjustment of the mA and/or kV according to patient  size and/or use of iterative reconstruction technique. COMPARISON:  Radiograph 03/25/2023 FINDINGS: Bones/Joint/Cartilage No acute fracture or dislocation.  Degenerative arthritis right hip. Ligaments Suboptimally assessed by CT. Muscles and Tendons Retroperitoneal low to intermediate density fluid collection extending into the distal right psoas and right iliacus muscle compatible with retroperitoneal hematoma. This measures 7.6 x 5.8 cm. Soft tissues Nonspecific edema in the proximal right thigh. Vascular calcifications. IMPRESSION: 1. No acute fracture or dislocation. 2. Retroperitoneal hematoma extending into the distal right psoas and right iliacus muscle. Electronically Signed   By:  Minerva Fester M.D.   On: 03/26/2023 01:33   DG Hip Unilat W or Wo Pelvis 2-3 Views Right  Result Date: 03/25/2023 CLINICAL DATA:  Hip pain EXAM: DG HIP (WITH OR WITHOUT PELVIS) 2-3V RIGHT COMPARISON:  Right hip x-ray 03/05/2023 FINDINGS: The bones are mildly osteopenic. There is no acute fracture or dislocation. There are moderate degenerative changes of both hips similar to the prior study. Peripheral vascular calcifications are present. IMPRESSION: 1. No acute fracture or dislocation. 2. Moderate degenerative changes of both hips. Electronically Signed   By: Darliss Cheney M.D.   On: 03/25/2023 22:05     Assessment and Plan:   Acute on Chronic Systolic Heart Failure  - EF was found to be 20-25% in 2018 in the setting of anterior STEMI. Most recent echocardiogram from 08/2022 showed EF 25-30%, mildly reduced RV function, mildly elevated PA systolic pressure, trivial MR and trivial AI  - PTA, patient was on entresto 49-51 mg BID and torsemide 20 mg daily  - Was not on BB due to history of bradycardia. Not on MRA due to history of hyperkalemia and gynecomastia on spiro. Not on SGLT2i due to yeast infections. Not on Bi-Dil due to low BP  - Now, patient admitted with a retroperitoneal hematoma 2 weeks after fall. BP has been low, so entresto and torsemide have been held  - BNP elevated today to 2,038. CXR showed possible asymmetric edema vs pneumonia  - Creatinine up to 3.13 today (baseline around 2.5). Liver enzymes have also been elevated this admission  - Patient was started on midodrine 10 mg TID this AM- received first dose around 11 AM  - Patient reports that he has not urinated yet today. Reports little urine output yesterday. With elevated creatinine and liver enzymes, low BP, minimal urine output, some concern for shock. May need additional BP support. Patient was warm and well perfused on exam. Overall feels well   - Ordered lactic acid   - Possible that patient would benefit from RHC to  assess cardiac output. Will discuss with MD.  - Patient is volume overloaded on exam, BP 106/53   Persistent Atrial Flutter - Patient recently underwent DCCV on 11/7 - Maintaining sinus bradycardia, HR in the 50s, per telemetry  - Continue amiodarone 200 mg daily  - Eliquis has been held due to retroperitoneal hematoma- this is not ideal as he underwent cardioversion about 3 weeks ago, but is reasonable with  hematoma   HTN  - BP low- home entresto and torsemide held   CAD  - S/p CABG x2 in 2018 - Denies chest pain  - Not on ASA due to eliquis use. Not on BB due to bradycardia   Otherwise per primary  - Retroperitoneal hematoma  - CAP  - right lower extremity swelling  - Leukocytosis  - Type 2 DM  - Lung nodule    Risk Assessment/Risk Scores:   New York Heart Association (  NYHA) Functional Class NYHA Class IV  CHA2DS2-VASc Score = 6   This indicates a 9.7% annual risk of stroke. The patient's score is based upon: CHF History: 1 HTN History: 1 Diabetes History: 1 Stroke History: 0 Vascular Disease History: 1 Age Score: 2 Gender Score: 0    For questions or updates, please contact [facility] Please consult [email or web address] for contact info under    Signed, Jonita Albee, PA-C  03/28/2023 3:00 PM

## 2023-03-28 NOTE — Plan of Care (Signed)

## 2023-03-28 NOTE — Progress Notes (Signed)
PROGRESS NOTE  [name] [name]  GNF:[id number] DOB: May 30, 1945 DOA: 03/25/2023 PCP: [name], MD   Brief Narrative: Patient is a 77 year old male with history of hypertension, coronary artery disease, paroxysmal A-fib/flutter, chronic systolic CHF, status post pacemaker placement, peripheral vascular disease, diabetes type 2, bladder cancer, CKD stage IIIb who presented from home with complaint of hip pain after the fall.  He was also admitted on 03/05/2023 and was discharged on 11/24 after he presented with fall at home, found to have right distal clavicle fracture that was managed nonoperatively.  Patient reported worsening progressive right hip pain for last 2 days, unable to ambulate.  On condition he was hemodynamically stable.  Lab work showed WBC count of 14.2, creatinine of 1.9, hemoglobin of 10.9.  CT hip showed evidence of right retroperitoneal hematoma extending into the distal right psoas and right iliacus muscle.  CT chest also showed multifocal groundglass opacities bilaterally.  Started on antibiotics for possible PNA.  PT/OT consulted, recommendation is acute inpatient rehab.  Assessment & Plan:  Principal Problem:   Retroperitoneal hematoma  Fall/retroperitoneal hematoma:CT right hip showed Retroperitoneal hematoma extending into the distal right psoas and right iliacus muscle. CT imaging also showed multilocular acute to subacute retroperitoneal hematoma with a component noted superiorly along the posterolateral abdominal wall measuring 4.4 x 2.6 cm. Intramuscular hemorrhage within the right iliacus muscle with the dominant collection measuring 4.5 x 8.1 cm.  Continue pain management, supportive care.  This is most likely secondary to fall.  PT/OT consulted, AIR recommended.  Patient lives with his wife, ambulates with help of walker but has not been able to use it recently. Case was discussed with general surgery by ED physician, no surgical intervention  recommended.  Community acquired pneumonia: CT imaging showed multilevel groundglass infiltrates bilaterally.He is  currently on azithromycin, Rocephin.  Continue mucolytics, bronchodilator treatment as needed.  Currently on 2-3 l of oxygen per minute.  He is not on oxygen at home.  Will wean the oxygen.  Right lower extremity swelling: Venous Doppler did not show any DVT  Elevated liver enzymes: Mild, continue to monitor  Leukocytosis: This most likely reactive.  Continue to monitor  CKD stage IIIb: Baseline creatinine around 2.5.  Kidney function trended up to 3 today. Avoid nephrotoxins.  Could not order diuretics because of low blood pressure.  Acute on Chronic systolic congestive heart failure: Elevated BNP.  Status post AICD.  Home torsemide,Entresto on hold due to soft blood pressure.  Patient looks volume overloaded, will call cardiology consult  Atrial flutter/fibrillation: Monitor on telemetry.  Eliquis on hold due to finding of retroperitoneal hematoma.  Continue monitor. Likely hold Eliquis for a week.  Follows with cardiology as an outpatient.  Hypertension: Currently blood pressure soft.  Hold torsemide, Entresto   Coronary artery disease: Lipitor on hold due to elevated enzymes.  No anginal symptoms.  Type 2 diabetes: Monitor blood sugars, continue sliding scale  Lung nodule: CT imaging showed 16 mm nodule within the basilar right middle lobe, indeterminate. Repeat CT in  3 months or PET -CT     DVT prophylaxis:Place and maintain sequential compression device Start: 03/26/23 1608     Code Status: Full Code  Family Communication: Wife at bedside on 11/28  Patient status:Inpatient  Patient is from :Home  Anticipated discharge to:[facility]IR  Estimated DC date:1-2 days   Consultants: None  Procedures:None  Antimicrobials:  Anti-infectives (From admission, onward)    Start     Dose/Rate Route Frequency Ordered Stop  03/27/23 0600  cefTRIAXone (ROCEPHIN) 1 g in  sodium chloride 0.9 % 100 mL IVPB        1 g 200 mL/hr over 30 Minutes Intravenous Every 24 hours 03/26/23 0821 03/31/23 0559   03/27/23 0600  azithromycin (ZITHROMAX) 500 mg in sodium chloride 0.9 % 250 mL IVPB  Status:  Discontinued        500 mg 250 mL/hr over 60 Minutes Intravenous Every 24 hours 03/26/23 0821 03/28/23 0934   03/26/23 0615  cefTRIAXone (ROCEPHIN) 1 g in sodium chloride 0.9 % 100 mL IVPB        1 g 200 mL/hr over 30 Minutes Intravenous  Once 03/26/23 0600 03/26/23 0744   03/26/23 0615  azithromycin (ZITHROMAX) tablet 500 mg        500 mg Oral  Once 03/26/23 0600 03/26/23 0612       Subjective: Patient seen and examined at bedside today.  Appears comfortable.  Denies any new complaints.  Still on 2 L of oxygen per minute.  Denies worsening shortness of breath or cough.  No significant pain on the right flank.  Wife at bedside.  Auscultated to have some crackles today.  No peripheral edema.  Blood pressure remains low  Objective: Vitals:   03/27/23 2020 03/28/23 0323 03/28/23 0810 03/28/23 0950  BP: (!) 114/46 (!) 108/49 (!) 95/51 (!) 90/40  Pulse: 60 (!) 59    Resp: (!) 25 (!) 22 19   Temp: 99.3 F (37.4 C) 98.7 F (37.1 C) 97.9 F (36.6 C)   TempSrc: Oral Oral Oral   SpO2: 91% 93% 92%   Weight:      Height:        Intake/Output Summary (Last 24 hours) at 03/28/2023 1009 Last data filed at 03/27/2023 1900 Gross per 24 hour  Intake 910 ml  Output 350 ml  Net 560 ml   Filed Weights   03/26/23 1847  Weight: 89.5 kg    Examination:  General exam: Overall comfortable, not in distress HEENT: PERRL Respiratory system: Fine crackles bilaterally at bases Cardiovascular system: S1 & S2 heard, RRR.  Gastrointestinal system: Abdomen is nondistended, soft and nontender. Central nervous system: Alert and oriented Extremities: No edema, no clubbing ,no cyanosis Skin: No rashes, no ulcers,no icterus     Data Reviewed: I have personally reviewed following  labs and imaging studies  CBC: Recent Labs  Lab 03/25/23 2025 03/27/23 0636 03/28/23 0548  WBC 14.2* 13.8* 13.7*  NEUTROABS 12.0*  --   --   HGB 10.9* 9.7* 9.8*  HCT 35.4* 29.9* 30.4*  MCV 85.5 82.8 83.3  PLT 555* 475* 463*   Basic Metabolic Panel: Recent Labs  Lab 03/25/23 2025 03/27/23 0636 03/28/23 0548  NA 133* 134* 131*  K 3.7 4.3 4.1  CL 105 105 101  CO2 21* 20* 18*  GLUCOSE 176* 101* 119*  BUN 32* 37* 48*  CREATININE 1.96* 2.25* 3.13*  CALCIUM 8.1* 8.1* 8.1*  MG 2.1  --   --      Recent Results (from the past 240 hour(s))  Respiratory (~20 pathogens) panel by PCR     Status: None   Collection Time: 03/26/23  9:47 AM   Specimen: Nasopharyngeal Swab; Respiratory  Result Value Ref Range Status   Adenovirus NOT DETECTED NOT DETECTED Final   Coronavirus 229E NOT DETECTED NOT DETECTED Final    Comment: (NOTE) The Coronavirus on the Respiratory Panel, DOES NOT test for the novel  Coronavirus (2019 nCoV)  Coronavirus HKU1 NOT DETECTED NOT DETECTED Final   Coronavirus NL63 NOT DETECTED NOT DETECTED Final   Coronavirus OC43 NOT DETECTED NOT DETECTED Final   Metapneumovirus NOT DETECTED NOT DETECTED Final   Rhinovirus / Enterovirus NOT DETECTED NOT DETECTED Final   Influenza A NOT DETECTED NOT DETECTED Final   Influenza B NOT DETECTED NOT DETECTED Final   Parainfluenza Virus 1 NOT DETECTED NOT DETECTED Final   Parainfluenza Virus 2 NOT DETECTED NOT DETECTED Final   Parainfluenza Virus 3 NOT DETECTED NOT DETECTED Final   Parainfluenza Virus 4 NOT DETECTED NOT DETECTED Final   Respiratory Syncytial Virus NOT DETECTED NOT DETECTED Final   Bordetella pertussis NOT DETECTED NOT DETECTED Final   Bordetella Parapertussis NOT DETECTED NOT DETECTED Final   Chlamydophila pneumoniae NOT DETECTED NOT DETECTED Final   Mycoplasma pneumoniae NOT DETECTED NOT DETECTED Final    Comment: Performed at [facility], [address], [place], [place] [address]  SARS  Coronavirus 2 by RT PCR (hospital order, performed in [facility] hospital lab) *cepheid single result test* Anterior Nasal Swab     Status: None   Collection Time: 03/26/23  9:47 AM   Specimen: Anterior Nasal Swab  Result Value Ref Range Status   SARS Coronavirus 2 by RT PCR NEGATIVE NEGATIVE Final    Comment: Performed at [facility], [address] [place], [place] [address]     Radiology Studies: VAS [place] LOWER EXTREMITY VENOUS (DVT)  Result Date: 03/27/2023  Lower Venous DVT Study Patient Name:  [name] [name]  Date of Exam:   03/26/2023 Medical Rec #: [id number]         Accession #:    [id number] Date of Birth: 21-Jan-1946         Patient Gender: M Patient Age:   23 years Exam Location:  [facility] Procedure:      VAS [place] LOWER EXTREMITY VENOUS (DVT) Referring Phys: Orpha Bur FOUST --------------------------------------------------------------------------------  Indications: Swelling, Edema, and A-fib.  Comparison Study: No prior exam. Performing Technologist: Fernande Bras  Examination Guidelines: A complete evaluation includes B-mode imaging, spectral Doppler, color Doppler, and power Doppler as needed of all accessible portions of each vessel. Bilateral testing is considered an integral part of a complete examination. Limited examinations for reoccurring indications may be performed as noted. The reflux portion of the exam is performed with the patient in reverse Trendelenburg.  +---------+---------------+---------+-----------+----------+--------------+ RIGHT    CompressibilityPhasicitySpontaneityPropertiesThrombus Aging +---------+---------------+---------+-----------+----------+--------------+ CFV      Full           Yes      Yes                                 +---------+---------------+---------+-----------+----------+--------------+ SFJ      Full                                                         +---------+---------------+---------+-----------+----------+--------------+ FV Prox  Full                                                        +---------+---------------+---------+-----------+----------+--------------+  FV Mid   Full                                                        +---------+---------------+---------+-----------+----------+--------------+ FV DistalFull                                                        +---------+---------------+---------+-----------+----------+--------------+ PFV      Full                                                        +---------+---------------+---------+-----------+----------+--------------+ POP      Full           Yes      Yes                                 +---------+---------------+---------+-----------+----------+--------------+ PTV      Full                                                        +---------+---------------+---------+-----------+----------+--------------+ PERO     Full                                                        +---------+---------------+---------+-----------+----------+--------------+   +----+---------------+---------+-----------+----------+--------------+ LEFTCompressibilityPhasicitySpontaneityPropertiesThrombus Aging +----+---------------+---------+-----------+----------+--------------+ CFV Full           Yes      Yes                                 +----+---------------+---------+-----------+----------+--------------+     Summary: RIGHT: - There is no evidence of deep vein thrombosis in the lower extremity.  - No cystic structure found in the popliteal fossa.  LEFT: - No evidence of common femoral vein obstruction.   *See table(s) above for measurements and observations. Electronically signed by Heath Lark on 03/27/2023 at 4:44:43 PM.    Final     Scheduled Meds:  amiodarone  200 mg Oral Daily   guaiFENesin  600 mg Oral BID   insulin aspart  0-5 Units  Subcutaneous QHS   insulin aspart  0-6 Units Subcutaneous TID WC   midodrine  10 mg Oral TID WC   oxymetazoline  1 spray Each Nare QHS   sodium bicarbonate  650 mg Oral BID   sodium chloride flush  3 mL Intravenous Q12H   Continuous Infusions:  cefTRIAXone (ROCEPHIN)  IV 1 g (03/28/23 0557)     LOS: 2 days   Burnadette Pop, MD [facility] P11/28/2024, 10:09 AM

## 2023-03-29 DIAGNOSIS — I5023 Acute on chronic systolic (congestive) heart failure: Secondary | ICD-10-CM | POA: Diagnosis not present

## 2023-03-29 DIAGNOSIS — I4892 Unspecified atrial flutter: Secondary | ICD-10-CM | POA: Diagnosis not present

## 2023-03-29 DIAGNOSIS — K683 Retroperitoneal hematoma: Secondary | ICD-10-CM | POA: Diagnosis not present

## 2023-03-29 LAB — CBC
HCT: 30.8 % — ABNORMAL LOW (ref 39.0–52.0)
Hemoglobin: 9.8 g/dL — ABNORMAL LOW (ref 13.0–17.0)
MCH: 26.6 pg (ref 26.0–34.0)
MCHC: 31.8 g/dL (ref 30.0–36.0)
MCV: 83.5 fL (ref 80.0–100.0)
Platelets: 489 10*3/uL — ABNORMAL HIGH (ref 150–400)
RBC: 3.69 MIL/uL — ABNORMAL LOW (ref 4.22–5.81)
RDW: 17.2 % — ABNORMAL HIGH (ref 11.5–15.5)
WBC: 11.3 10*3/uL — ABNORMAL HIGH (ref 4.0–10.5)
nRBC: 0 % (ref 0.0–0.2)

## 2023-03-29 LAB — GLUCOSE, CAPILLARY
Glucose-Capillary: 108 mg/dL — ABNORMAL HIGH (ref 70–99)
Glucose-Capillary: 141 mg/dL — ABNORMAL HIGH (ref 70–99)
Glucose-Capillary: 161 mg/dL — ABNORMAL HIGH (ref 70–99)
Glucose-Capillary: 176 mg/dL — ABNORMAL HIGH (ref 70–99)

## 2023-03-29 LAB — BASIC METABOLIC PANEL
Anion gap: 9 (ref 5–15)
BUN: 55 mg/dL — ABNORMAL HIGH (ref 8–23)
CO2: 19 mmol/L — ABNORMAL LOW (ref 22–32)
Calcium: 8.2 mg/dL — ABNORMAL LOW (ref 8.9–10.3)
Chloride: 103 mmol/L (ref 98–111)
Creatinine, Ser: 3.26 mg/dL — ABNORMAL HIGH (ref 0.61–1.24)
GFR, Estimated: 19 mL/min — ABNORMAL LOW (ref >60)
Glucose, Bld: 118 mg/dL — ABNORMAL HIGH (ref 70–99)
Potassium: 4.6 mmol/L (ref 3.5–5.1)
Sodium: 131 mmol/L — ABNORMAL LOW (ref 135–145)

## 2023-03-29 MED ORDER — BISACODYL 10 MG RE SUPP
10.0000 mg | Freq: Once | RECTAL | Status: AC
  Administered 2023-03-29: 10 mg via RECTAL
  Filled 2023-03-29: qty 1

## 2023-03-29 MED ORDER — POLYETHYLENE GLYCOL 3350 17 G PO PACK
17.0000 g | PACK | Freq: Every day | ORAL | Status: DC
  Administered 2023-03-30 – 2023-04-05 (×5): 17 g via ORAL
  Filled 2023-03-29 (×9): qty 1

## 2023-03-29 MED ORDER — FUROSEMIDE 10 MG/ML IJ SOLN
40.0000 mg | Freq: Once | INTRAMUSCULAR | Status: AC
  Administered 2023-03-29: 40 mg via INTRAVENOUS
  Filled 2023-03-29: qty 4

## 2023-03-29 MED ORDER — SENNOSIDES-DOCUSATE SODIUM 8.6-50 MG PO TABS
1.0000 | ORAL_TABLET | Freq: Two times a day (BID) | ORAL | Status: DC
  Administered 2023-03-29 – 2023-04-05 (×13): 1 via ORAL
  Filled 2023-03-29 (×19): qty 1

## 2023-03-29 NOTE — Progress Notes (Signed)
Physical Therapy Treatment Patient Details Name: [name] [name] MRN: [id number] DOB: 06-21-1945 Today's Date: 03/29/2023   History of Present Illness 77 y.o. male presents to [facility] on 03/25/2023 with progressively worsening R hip pain. Pt found to have retroperitoneal hematoma extending into the distal R psoas and R iliacus. Started on antibiotics for possible PNA. Negative [place] for R LE DVT. Pt was recently admitted 11/5-11/8 after a fall and R distal clavicle fx. PMH includes AICD, bladder CA, CHF, CAD, DMII, PAF, PNA. 11/5 right distal clavicle fracture that was managed nonoperatively.    PT Comments  Pt in bed upon arrival and agreeable to PT session. Worked on transfers and gait training in today's session. Pt was able to stand with ModAx2 and RW. Upon standing, pt had difficulty standing fully upright with bilateral knees flexing. Pt was able to take steps towards the recliner with MinAx2. Messaged provider about prior WB restrictions from previous hospital admission. Pt reports utilizing RW at home with no UE precautions. Pt is progressing well towards goals. Continue to recommend >3 hrs post acute rehab to help pt return to prior level of independence. Acute PT to follow.      If plan is discharge home, recommend the following: A lot of help with walking and/or transfers;A lot of help with bathing/dressing/bathroom;Assist for transportation;Help with stairs or ramp for entrance   Can travel by private vehicle      No  Equipment Recommendations  None recommended by PT (will continue to assess)    Recommendations for Other Services Rehab consult     Precautions / Restrictions Precautions Precautions: Fall Precaution Comments: watch BP and HR Restrictions Weight Bearing Restrictions:  (clarifying WB precautions with MD from prior hospital admission) RUE Weight Bearing: Non weight bearing Other Position/Activity Restrictions: pt 11/8 orders NWB with gentle ROM recommends.  Requests clarification from MD this session as pt reports using RW at home full WBat     Mobility  Bed Mobility Overal bed mobility: Needs Assistance Bed Mobility: Supine to Sit     Supine to sit: Mod assist, HOB elevated, Used rails     General bed mobility comments: ModA to complete moving LE's off EOB and for trunk elevation. Cues for sequencing    Transfers Overall transfer level: Needs assistance Equipment used: Rolling walker (2 wheels) Transfers: Sit to/from Stand Sit to Stand: Mod assist, +2 physical assistance   Step pivot transfers: Min assist, +2 physical assistance       General transfer comment: ModAx2 for boost up and steadying. Pt had difficulty standing upright with B knees tending to flex. Cues for upright posture and to straighten legs. Able to take steps towards recliner with RW managment and assist for steadying    Ambulation/Gait Ambulation/Gait assistance: Min assist, +2 physical assistance Gait Distance (Feet): 2 Feet Assistive device: Rolling walker (2 wheels) Gait Pattern/deviations: Step-to pattern, Decreased stride length, Shuffle       General Gait Details: able to take steps with B knees bent and forward flexed trunk, MinAx2 for RW management and steadying         Balance Overall balance assessment: Needs assistance, History of Falls Sitting-balance support: Feet supported, Bilateral upper extremity supported Sitting balance-Leahy Scale: Fair Sitting balance - Comments: R lateral lean initially with CGA, progressed to supervision for safety with upright posture Postural control: Right lateral lean Standing balance support: Reliant on assistive device for balance, During functional activity, Bilateral upper extremity supported Standing balance-Leahy Scale: Poor Standing balance comment:  Reliant on external support        Cognition Arousal: Alert Behavior During Therapy: WFL for tasks assessed/performed Overall Cognitive Status:  Within Functional Limits for tasks assessed       Exercises Other Exercises Other Exercises: x2 STS with ModAx2    General Comments General comments (skin integrity, edema, etc.): SpO2 >89% on 2.5 L O2, cues for pursed lip breathing as pt tends to desat with movement. BP sitting EOB- 104/62, 68, BP sitting after transferring to the chair- 122/49, 69. No symptoms of dizziness or lightheadedness      Pertinent Vitals/Pain Pain Assessment Pain Assessment: Faces Faces Pain Scale: Hurts little more Pain Location: R hip Pain Descriptors / Indicators: Tender, Discomfort, Grimacing, Guarding Pain Intervention(s): Limited activity within patient's tolerance, Monitored during session, Repositioned     PT Goals (current goals can now be found in the care plan section) Acute Rehab PT Goals PT Goal Formulation: With patient Time For Goal Achievement: 04/10/23 Potential to Achieve Goals: Good Progress towards PT goals: Progressing toward goals    Frequency    Min 1X/week           Co-evaluation PT/OT/SLP Co-Evaluation/Treatment: Yes Reason for Co-Treatment: Complexity of the patient's impairments (multi-system involvement);For patient/therapist safety;To address functional/ADL transfers PT goals addressed during session: Mobility/safety with mobility;Balance OT goals addressed during session: ADL's and self-care;Proper use of Adaptive equipment and DME;Strengthening/ROM      AM-PAC PT "6 Clicks" Mobility   Outcome Measure  Help needed turning from your back to your side while in a flat bed without using bedrails?: A Little Help needed moving from lying on your back to sitting on the side of a flat bed without using bedrails?: A Lot Help needed moving to and from a bed to a chair (including a wheelchair)?: A Lot Help needed standing up from a chair using your arms (e.g., wheelchair or bedside chair)?: A Lot Help needed to walk in hospital room?: Total Help needed climbing 3-5  steps with a railing? : Total 6 Click Score: 11    End of Session Equipment Utilized During Treatment: Gait belt;Oxygen Activity Tolerance: Patient tolerated treatment well Patient left: in chair;with chair alarm set;with family/visitor present;with call bell/phone within reach Nurse Communication: Mobility status PT Visit Diagnosis: Unsteadiness on feet (R26.81);Repeated falls (R29.6);Other abnormalities of gait and mobility (R26.89)     Time: 1610-9604 PT Time Calculation (min) (ACUTE ONLY): 28 min  Charges:    $Therapeutic Activity: 8-22 mins PT General Charges $$ ACUTE PT VISIT: 1 Visit                     Hilton Cork, PT, DPT Secure Chat Preferred  Rehab Office [phone]    Arturo Morton Brion Aliment 03/29/2023, 1:47 PM

## 2023-03-29 NOTE — Progress Notes (Signed)
Inpatient Rehabilitation Admissions Coordinator   He has progressed with therapy. I will place rehab consult to assess for candidacy for CIR admit.  Ottie Glazier, RN, MSN Rehab Admissions Coordinator [phone] 03/29/2023 9:00 PM

## 2023-03-29 NOTE — Progress Notes (Signed)
OT EVALUATION PT admitted with R hip pain due to hematoma in muscle. Pt currently with functional limitiations due to the deficits listed below (see OT problem list). Pt with recent decline in ADLS due to pain limiting and R UE injury. MD clarification needed for R UE weightbearing status as last notes are 11/8 for NWB. Pt reports using RW at home ( 1 upstairs and one downstairs) Pt will benefit from skilled OT to increase their independence and safety with adls and balance to allow discharge Patient will benefit from intensive inpatient follow up therapy, >3 hours/day .   03/29/23 1150  OT Visit Information  Last OT Received On 03/29/23  Assistance Needed +2  PT/OT/SLP Co-Evaluation/Treatment Yes  Reason for Co-Treatment Complexity of the patient's impairments (multi-system involvement);For patient/therapist safety;To address functional/ADL transfers  OT goals addressed during session ADL's and self-care;Proper use of Adaptive equipment and DME;Strengthening/ROM  History of Present Illness 77 y.o. male presents to [facility] hospital on 03/25/2023 with progressively worsening R hip pain. Pt found to have retroperitoneal hematoma extending into the distal R psoas and R iliacus. Started on antibiotics for possible PNA. Negative [place] for R LE DVT. Pt was recently admitted 11/5-11/8 after a fall and R distal clavicle fx. PMH includes AICD, bladder CA, CHF, CAD, DMII, PAF, PNA. 11/5 right distal clavicle fracture that was managed nonoperatively.  Precautions  Precautions Fall  Precaution Comments watch BP and HR  Restrictions  Weight Bearing Restrictions Yes  RUE Weight Bearing NWB  Other Position/Activity Restrictions pt 11/8 orders NWB with gentle ROM recommends. Requests clarification from MD this session as pt reports using RW at home full [facility] Living  Family/patient expects to be discharged to: Private residence  Living Arrangements Spouse/significant other  Available Help at Discharge  Family;Available 24 hours/day  Type of Home House  Home Access Stairs to enter  Entrance Stairs-Number of Steps 2  Entrance Stairs-Rails None  Home Layout Multi-level;Bed/bath upstairs (half bath downstairs)  Alternate Level Stairs-Number of Steps flight  Alternate Level Stairs-Rails Left  Bathroom Shower/Tub Walk-in Pension scheme manager Yes  How Accessible Accessible via walker  Home Research scientist (physical sciences) (2 wheels);Cane - single point;Shower seat  Prior Function  Prior Level of Function  Independent/Modified Independent;History of Falls (last six months)  Mobility Comments uses RW past few weeks after the fall. Increased pain in R hip limiting mobility  ADLs Comments independent  Pain Assessment  Pain Assessment Faces  Faces Pain Scale 4  Pain Location R hip  Pain Descriptors / Indicators Discomfort;Grimacing  Pain Intervention(s) Limited activity within patient's tolerance;Monitored during session;Repositioned  Cognition  Arousal Alert  Behavior During Therapy WFL for tasks assessed/performed  Overall Cognitive Status Within Functional Limits for tasks assessed  Communication  Communication No apparent difficulties  Cueing Techniques Verbal cues  Upper Extremity Assessment  Upper Extremity Assessment Right hand dominant;RUE deficits/detail  RUE Deficits / Details need further clarification on restrictions as pt using it without restrictions on arrival.  Lower Extremity Assessment  Lower Extremity Assessment Generalized weakness;LLE deficits/detail  LLE Deficits / Details noted with standing needed cues for quad activation and knee extension  Cervical / Trunk Assessment  Cervical / Trunk Assessment Kyphotic  Vision- History  Baseline Vision/History 1 Wears glasses  Patient Visual Report No change from baseline  ADL  Overall ADL's  Needs assistance/impaired  Eating/Feeding Modified independent  Toilet Transfer +2 for physical  assistance;Moderate assistance;Stand-pivot;Regular Social worker Details (indicate cue type  and reason) cues for hand placment and pt reaching back with L UE simulated RW to chair from eob  Bed Mobility  Overal bed mobility Needs Assistance  Bed Mobility Supine to Sit  Supine to sit Mod assist;HOB elevated;Used rails  General bed mobility comments Mod A to complete moving LE's off EOB and for trunk elevation. Cues for sequencing. pt noted to have incontinence of bladder in the bed due to spilled urinal and not notifying staff  Transfers  Overall transfer level Needs assistance  Equipment used Rolling walker (2 wheels)  Transfers Sit to/from Stand  Sit to Stand Mod assist;+2 physical assistance  Step pivot transfers Min assist;+2 physical assistance  General transfer comment ModAx2 for boost up and steadying. Pt had difficulty standing upright with B knees tending to flex. Cues for upright posture and to straighten legs. Able to take steps towards recliner with RW managment and assist for steadying  Balance  Overall balance assessment Needs assistance;History of Falls  Sitting-balance support Feet supported;Bilateral upper extremity supported  Sitting balance-Leahy Scale Fair  Sitting balance - Comments R lateral lean initially with CGA, progressed to supervision for safety with upright posture  Postural control Right lateral lean  Standing balance support Reliant on assistive device for balance;During functional activity;Bilateral upper extremity supported  Standing balance-Leahy Scale Poor  Standing balance comment Reliant on external support  General Comments  General comments (skin integrity, edema, etc.) [place] 2.5L on arrival. pt in sitting sustain 91-93% FIO2. with movement pt dropped to 89% noted to have RR brady on monitor . BP sitting 104/62 and after transfer 122/49. pt reports no symtpoms but states "thats what happen at home. i  didnt know it was happening"  Other Exercises   Other Exercises x2 sit<>Stand at EOB with cues for sequence  OT - End of Session  Equipment Utilized During Treatment Gait belt;Rolling walker (2 wheels);Oxygen  Activity Tolerance Patient tolerated treatment well  Patient left in chair;with call bell/phone within reach;with chair alarm set;with family/visitor present;with SCD's reapplied  Nurse Communication Mobility status;Precautions  OT Assessment  OT Recommendation/Assessment Patient needs continued OT Services  OT Visit Diagnosis Unsteadiness on feet (R26.81);History of falling (Z91.81);Pain  Pain - Right/Left Right  Pain - part of body Shoulder;Hip  OT Problem List Decreased range of motion;Decreased strength;Impaired balance (sitting and/or standing);Pain  OT Plan  OT Frequency (ACUTE ONLY) Min 1X/week  OT Treatment/Interventions (ACUTE ONLY) Self-care/ADL training;Therapeutic activities;Balance training;DME and/or AE instruction;Patient/family education  AM-PAC OT "6 Clicks" Daily Activity Outcome Measure (Version 2)  Help from another person eating meals? 4  Help from another person taking care of personal grooming? 3  Help from another person toileting, which includes using toliet, bedpan, or urinal? 3  Help from another person bathing (including washing, rinsing, drying)? 3  Help from another person to put on and taking off regular upper body clothing? 2  Help from another person to put on and taking off regular lower body clothing? 3  6 Click Score 18  Progressive Mobility  What is the highest level of mobility based on the progressive mobility assessment? Level 3 (Stands with assist) - Balance while standing  and cannot march in place  Mobility Referral No  Activity Stood at bedside;Transferred from bed to chair  OT Recommendation  Recommendations for Other Services Rehab consult  Follow Up Recommendations Acute inpatient rehab (3hours/day)  Patient can return home with the following A lot of help with walking and/or  transfers;Two people to help with bathing/dressing/bathroom  Functional Status Assessent Patient has had a recent decline in their functional status and demonstrates the ability to make significant improvements in function in a reasonable and predictable amount of time.  OT Equipment Other (comment) (TBA)  Individuals Consulted  Consulted and Agree with Results and Recommendations Patient  Acute Rehab OT Goals  Patient Stated Goal to be able to go home.  OT Goal Formulation With patient  Time For Goal Achievement 04/12/23  Potential to Achieve Goals Good  OT Time Calculation  OT Start Time (ACUTE ONLY) 1019  OT Stop Time (ACUTE ONLY) 1048  OT Time Calculation (min) 29 min  OT General Charges  $OT Visit 1 Visit  OT Evaluation  $OT Eval Moderate Complexity 1 Mod   Brynn, OTR/L  Acute Rehabilitation Services Office: [phone] .

## 2023-03-29 NOTE — Plan of Care (Signed)

## 2023-03-29 NOTE — Progress Notes (Signed)
Progress Note  Patient Name: [name] [name] Date of Encounter: 03/29/2023  Primary Cardiologist: Arvilla Meres, MD   Subjective   Patient seen and examined at his bedside. His wife in the room.   Inpatient Medications    Scheduled Meds:  amiodarone  200 mg Oral Daily   bisacodyl  10 mg Rectal Once   guaiFENesin  600 mg Oral BID   insulin aspart  0-5 Units Subcutaneous QHS   insulin aspart  0-6 Units Subcutaneous TID WC   midodrine  10 mg Oral TID WC   oxymetazoline  1 spray Each Nare QHS   polyethylene glycol  17 g Oral Daily   senna-docusate  1 tablet Oral BID   sodium bicarbonate  650 mg Oral BID   sodium chloride flush  3 mL Intravenous Q12H   Continuous Infusions:  cefTRIAXone (ROCEPHIN)  IV 1 g (03/29/23 0703)   PRN Meds: albuterol, benzonatate, HYDROmorphone (DILAUDID) injection, melatonin, ondansetron **OR** ondansetron (ZOFRAN) IV, oxyCODONE, polyethylene glycol, sodium chloride flush   Vital Signs    Vitals:   03/28/23 1527 03/28/23 1900 03/29/23 0300 03/29/23 0825  BP: (!) 113/53 (!) 104/51 (!) 104/50 (!) 111/51  Pulse: (!) 51 (!) 50 (!) 56   Resp:   17 16  Temp:  98.3 F (36.8 C) 98.3 F (36.8 C)   TempSrc:  Oral Oral   SpO2:  95% 94% 94%  Weight:      Height:        Intake/Output Summary (Last 24 hours) at 03/29/2023 1517 Last data filed at 03/29/2023 0300 Gross per 24 hour  Intake 250 ml  Output 450 ml  Net -200 ml   Filed Weights   03/26/23 1847  Weight: 89.5 kg    Telemetry    Sinus  - Personally Reviewed  ECG    None  - Personally Reviewed  Physical Exam    General: Comfortable Head: Atraumatic, normal size  Eyes: PEERLA, EOMI  Neck: Supple, normal JVD Cardiac: Normal S1, S2; RRR; no murmurs, rubs, or gallops Lungs: Clear to auscultation bilaterally Abd: Soft, nontender, no hepatomegaly  Ext: warm, no edema Musculoskeletal: No deformities, BUE and BLE strength normal and equal Skin: Warm and dry, no rashes    Neuro: Alert and oriented to person, place, time, and situation, CNII-XII grossly intact, no focal deficits  Psych: Normal mood and affect   Labs    Chemistry Recent Labs  Lab 03/25/23 2025 03/27/23 0636 03/28/23 0548 03/29/23 0523  NA 133* 134* 131* 131*  K 3.7 4.3 4.1 4.6  CL 105 105 101 103  CO2 21* 20* 18* 19*  GLUCOSE 176* 101* 119* 118*  BUN 32* 37* 48* 55*  CREATININE 1.96* 2.25* 3.13* 3.26*  CALCIUM 8.1* 8.1* 8.1* 8.2*  PROT 6.1*  --  5.5*  --   ALBUMIN 2.4*  --  2.0*  --   AST 74*  --  92*  --   ALT 85*  --  75*  --   ALKPHOS 115  --  99  --   BILITOT 1.1  --  0.9  --   GFRNONAA 35* 29* 20* 19*  ANIONGAP 7 9 12 9      Hematology Recent Labs  Lab 03/27/23 0636 03/28/23 0548 03/29/23 0523  WBC 13.8* 13.7* 11.3*  RBC 3.61* 3.65* 3.69*  HGB 9.7* 9.8* 9.8*  HCT 29.9* 30.4* 30.8*  MCV 82.8 83.3 83.5  MCH 26.9 26.8 26.6  MCHC 32.4 32.2 31.8  RDW 17.4*  17.4* 17.2*  PLT 475* 463* 489*    Cardiac EnzymesNo results for input(s): "TROPONINI" in the last 168 hours. No results for input(s): "TROPIPOC" in the last 168 hours.   BNP Recent Labs  Lab 03/28/23 0548  BNP 2,038.1*     DDimer No results for input(s): "DDIMER" in the last 168 hours.   Radiology    DG CHEST PORT 1 VIEW  Result Date: 03/28/2023 CLINICAL DATA:  Short of breath. EXAM: PORTABLE CHEST 1 VIEW COMPARISON:  03/04/2023, 01/16/2023 and CT dated 03/26/2023. FINDINGS: Stable changes from prior cardiac surgery and stable cardiac enlargement. No mediastinal or hilar masses. Left and right anterior chest wall pacemakers/AICD are stable. Irregular interstitial opacities and mild hazy intervening airspace opacities are noted in the upper lobes, greater on the right. Minor basilar opacities are noted suspected to be atelectasis. Lungs are hyperexpanded but otherwise clear. No visualized pleural effusion.  No pneumothorax. IMPRESSION: 1. Interstitial hazy airspace lung opacities in the upper lobes most  evident on the right. On the recent prior chest CT, this was more apparent on the left. Consider asymmetric edema versus pneumonia. 2. No visualized pleural effusions. These may be improved from the recent prior chest CT or not visualized due to the AP technique. 3. No new abnormalities.  Stable cardiomegaly. Electronically Signed   By: Amie Portland M.D.   On: 03/28/2023 11:14    Cardiac Studies    Patient Profile     77 y.o. male with hx of COPD, type 2 diabetes, coronary artery disease status post CABG in 2018 with stage D systolic heart failure status post CCM, hypertension, history of bladder cancer and paroxysmal atrial fibrillation currently admitted with large retroperitoneal hematoma (on home Eliquis now held) and shortness of breath.   Assessment & Plan   Acute on Chronic Systolic Heart failure  Persistent Atrial flutter  Hypertension  CAD  Type DM2   Blood pressure still on the lower side, on midodrine. Will benefit from another dose of Lasix he is still significantly volume overloaded.  Eliquis is on hold due to retroperitoneal hematoma. He is on amio - please continue this.  CAD - no anginal symptoms.       For questions or updates, please contact [facility] Please consult [email or web address] for contact info under Cardiology/STEMI.      Signed, Thomasene Ripple, DO  03/29/2023, 3:17 PM

## 2023-03-29 NOTE — Progress Notes (Addendum)
PROGRESS NOTE  [name] [name]  GLO:[id number] DOB: July 24, 1945 DOA: 03/25/2023 PCP: [name], MD   Brief Narrative: Patient is a 77 year old male with history of hypertension, coronary artery disease, paroxysmal A-fib/flutter, chronic systolic CHF, status post pacemaker placement, peripheral vascular disease, diabetes type 2, bladder cancer, CKD stage IIIb who presented from home with complaint of hip pain after the fall.  He was also admitted on 03/05/2023 and was discharged on 11/24 after he presented with fall at home, found to have right distal clavicle fracture that was managed nonoperatively.  Patient reported worsening progressive right hip pain for last 2 days, unable to ambulate.  On condition he was hemodynamically stable.  Lab work showed WBC count of 14.2, creatinine of 1.9, hemoglobin of 10.9.  CT hip showed evidence of right retroperitoneal hematoma extending into the distal right psoas and right iliacus muscle.  CT chest also showed multifocal groundglass opacities bilaterally.  Started on antibiotics for possible PNA.  PT/OT consulted, recommendation is acute inpatient rehab. Cardiology following for acute on chronic systolic CHF.  Assessment & Plan:  Principal Problem:   Retroperitoneal hematoma  Fall/retroperitoneal hematoma:CT right hip showed Retroperitoneal hematoma extending into the distal right psoas and right iliacus muscle. CT imaging also showed multilocular acute to subacute retroperitoneal hematoma with a component noted superiorly along the posterolateral abdominal wall measuring 4.4 x 2.6 cm. Intramuscular hemorrhage within the right iliacus muscle with the dominant collection measuring 4.5 x 8.1 cm.  Continue pain management, supportive care.  This is most likely secondary to fall.  PT/OT consulted, AIR recommended.  Patient lives with his wife, ambulates with help of walker but has not been able to use it recently. Case was discussed with general surgery by ED  physician, no surgical intervention recommended.  AKI on CKD stage IIIb: Baseline creatinine around 2.5.  Kidney function trended up to 3 . Avoid nephrotoxins.  Likely will improve with further diuresis.  Most likely from cardiorenal syndrome.  Acute on Chronic systolic congestive heart failure: Elevated BNP.  Status post AICD.  Home torsemide,Entresto on hold.  Patient looks volume overloaded, cardiology following.  Given a dose of Lasix 60 mg IV once on 11/28, may need to continue IV diuresis. Last echo done on 11/7 showed EF of 20 to 25%  Community acquired pneumonia: CT imaging showed multilevel groundglass infiltrates bilaterally.He is  currently on  Rocephin.  Continue mucolytics, bronchodilator treatment as needed.  Currently on 2-3 l of oxygen per minute.  He is not on oxygen at home.  Will wean the oxygen.  Right lower extremity swelling: Venous Doppler did not show any DVT  Elevated liver enzymes: Mild, continue to monitor  Leukocytosis: This most likely reactive.  Continue to monitor,improving  Atrial flutter/fibrillation: Monitor on telemetry.  Eliquis on hold due to finding of retroperitoneal hematoma.  Continue monitor. Likely hold Eliquis for a week.  Follows with cardiology as an outpatient.  Hypertension: Currently blood pressure soft.  Hold torsemide, Entresto   Coronary artery disease: Lipitor on hold due to elevated enzymes.  No anginal symptoms.  Type 2 diabetes: Monitor blood sugars, continue sliding scale  Lung nodule: CT imaging showed 16 mm nodule within the basilar right middle lobe, indeterminate. Repeat CT in  3 months or PET -CT     DVT prophylaxis:Place and maintain sequential compression device Start: 03/26/23 1608     Code Status: Full Code  Family Communication: Wife at bedside on 11/29  Patient status:Inpatient  Patient is from :Home  Anticipated discharge to:[facility]  Estimated DC date:1-2 days   Consultants:  Cardiology  Procedures:None  Antimicrobials:  Anti-infectives (From admission, onward)    Start     Dose/Rate Route Frequency Ordered Stop   03/27/23 0600  cefTRIAXone (ROCEPHIN) 1 g in sodium chloride 0.9 % 100 mL IVPB        1 g 200 mL/hr over 30 Minutes Intravenous Every 24 hours 03/26/23 0821 03/31/23 0559   03/27/23 0600  azithromycin (ZITHROMAX) 500 mg in sodium chloride 0.9 % 250 mL IVPB  Status:  Discontinued        500 mg 250 mL/hr over 60 Minutes Intravenous Every 24 hours 03/26/23 0821 03/28/23 0934   03/26/23 0615  cefTRIAXone (ROCEPHIN) 1 g in sodium chloride 0.9 % 100 mL IVPB        1 g 200 mL/hr over 30 Minutes Intravenous  Once 03/26/23 0600 03/26/23 0744   03/26/23 0615  azithromycin (ZITHROMAX) tablet 500 mg        500 mg Oral  Once 03/26/23 0600 03/26/23 0612       Subjective: Patient seen and examined at bedside today.  Hemodynamically stable.  On 2 to 3 L of oxygen per minute.  He looks more comfortable this morning.  Denies any worsening shortness of breath or cough.  Objective: Vitals:   03/28/23 1527 03/28/23 1900 03/29/23 0300 03/29/23 0825  BP: (!) 113/53 (!) 104/51 (!) 104/50 (!) 111/51  Pulse: (!) 51 (!) 50 (!) 56   Resp:   17 16  Temp:  98.3 F (36.8 C) 98.3 F (36.8 C)   TempSrc:  Oral Oral   SpO2:  95% 94% 94%  Weight:      Height:        Intake/Output Summary (Last 24 hours) at 03/29/2023 1052 Last data filed at 03/29/2023 0300 Gross per 24 hour  Intake 350 ml  Output 450 ml  Net -100 ml   Filed Weights   03/26/23 1847  Weight: 89.5 kg    Examination:   General exam: Overall comfortable, not in distress HEENT: PERRL Respiratory system: Fine crackles bilaterally on bases but improved from yesterday Cardiovascular system: S1 & S2 heard, RRR.  Gastrointestinal system: Abdomen is nondistended, soft and nontender. Central nervous system: Alert and oriented Extremities: RLE  edema, no clubbing ,no cyanosis Skin: No rashes, no  ulcers,no icterus     Data Reviewed: I have personally reviewed following labs and imaging studies  CBC: Recent Labs  Lab 03/25/23 2025 03/27/23 0636 03/28/23 0548 03/29/23 0523  WBC 14.2* 13.8* 13.7* 11.3*  NEUTROABS 12.0*  --   --   --   HGB 10.9* 9.7* 9.8* 9.8*  HCT 35.4* 29.9* 30.4* 30.8*  MCV 85.5 82.8 83.3 83.5  PLT 555* 475* 463* 489*   Basic Metabolic Panel: Recent Labs  Lab 03/25/23 2025 03/27/23 0636 03/28/23 0548 03/29/23 0523  NA 133* 134* 131* 131*  K 3.7 4.3 4.1 4.6  CL 105 105 101 103  CO2 21* 20* 18* 19*  GLUCOSE 176* 101* 119* 118*  BUN 32* 37* 48* 55*  CREATININE 1.96* 2.25* 3.13* 3.26*  CALCIUM 8.1* 8.1* 8.1* 8.2*  MG 2.1  --   --   --      Recent Results (from the past 240 hour(s))  Respiratory (~20 pathogens) panel by PCR     Status: None   Collection Time: 03/26/23  9:47 AM   Specimen: Nasopharyngeal Swab; Respiratory  Result Value Ref Range Status  Adenovirus NOT DETECTED NOT DETECTED Final   Coronavirus 229E NOT DETECTED NOT DETECTED Final    Comment: (NOTE) The Coronavirus on the Respiratory Panel, DOES NOT test for the novel  Coronavirus (2019 nCoV)    Coronavirus HKU1 NOT DETECTED NOT DETECTED Final   Coronavirus NL63 NOT DETECTED NOT DETECTED Final   Coronavirus OC43 NOT DETECTED NOT DETECTED Final   Metapneumovirus NOT DETECTED NOT DETECTED Final   Rhinovirus / Enterovirus NOT DETECTED NOT DETECTED Final   Influenza A NOT DETECTED NOT DETECTED Final   Influenza B NOT DETECTED NOT DETECTED Final   Parainfluenza Virus 1 NOT DETECTED NOT DETECTED Final   Parainfluenza Virus 2 NOT DETECTED NOT DETECTED Final   Parainfluenza Virus 3 NOT DETECTED NOT DETECTED Final   Parainfluenza Virus 4 NOT DETECTED NOT DETECTED Final   Respiratory Syncytial Virus NOT DETECTED NOT DETECTED Final   Bordetella pertussis NOT DETECTED NOT DETECTED Final   Bordetella Parapertussis NOT DETECTED NOT DETECTED Final   Chlamydophila pneumoniae NOT  DETECTED NOT DETECTED Final   Mycoplasma pneumoniae NOT DETECTED NOT DETECTED Final    Comment: Performed at [facility], [address], [place], [place] [address]  SARS Coronavirus 2 by RT PCR (hospital order, performed in [facility] hospital lab) *cepheid single result test* Anterior Nasal Swab     Status: None   Collection Time: 03/26/23  9:47 AM   Specimen: Anterior Nasal Swab  Result Value Ref Range Status   SARS Coronavirus 2 by RT PCR NEGATIVE NEGATIVE Final    Comment: Performed at [facility], [address] [place], [place] [address]     Radiology Studies: DG CHEST PORT 1 VIEW  Result Date: 03/28/2023 CLINICAL DATA:  Short of breath. EXAM: PORTABLE CHEST 1 VIEW COMPARISON:  03/04/2023, 01/16/2023 and CT dated 03/26/2023. FINDINGS: Stable changes from prior cardiac surgery and stable cardiac enlargement. No mediastinal or hilar masses. Left and right anterior chest wall pacemakers/AICD are stable. Irregular interstitial opacities and mild hazy intervening airspace opacities are noted in the upper lobes, greater on the right. Minor basilar opacities are noted suspected to be atelectasis. Lungs are hyperexpanded but otherwise clear. No visualized pleural effusion.  No pneumothorax. IMPRESSION: 1. Interstitial hazy airspace lung opacities in the upper lobes most evident on the right. On the recent prior chest CT, this was more apparent on the left. Consider asymmetric edema versus pneumonia. 2. No visualized pleural effusions. These may be improved from the recent prior chest CT or not visualized due to the AP technique. 3. No new abnormalities.  Stable cardiomegaly. Electronically Signed   By: Amie Portland M.D.   On: 03/28/2023 11:14    Scheduled Meds:  amiodarone  200 mg Oral Daily   guaiFENesin  600 mg Oral BID   insulin aspart  0-5 Units Subcutaneous QHS   insulin aspart  0-6 Units Subcutaneous TID WC   midodrine  10 mg Oral TID WC   oxymetazoline  1 spray Each Nare  QHS   sodium bicarbonate  650 mg Oral BID   sodium chloride flush  3 mL Intravenous Q12H   Continuous Infusions:  cefTRIAXone (ROCEPHIN)  IV 1 g (03/29/23 0703)     LOS: 3 days   Burnadette Pop, MD [facility] P11/29/2024, 10:52 AM

## 2023-03-30 DIAGNOSIS — I5023 Acute on chronic systolic (congestive) heart failure: Secondary | ICD-10-CM | POA: Diagnosis not present

## 2023-03-30 DIAGNOSIS — K683 Retroperitoneal hematoma: Secondary | ICD-10-CM | POA: Diagnosis not present

## 2023-03-30 LAB — CBC
HCT: 31 % — ABNORMAL LOW (ref 39.0–52.0)
Hemoglobin: 9.8 g/dL — ABNORMAL LOW (ref 13.0–17.0)
MCH: 26.3 pg (ref 26.0–34.0)
MCHC: 31.6 g/dL (ref 30.0–36.0)
MCV: 83.1 fL (ref 80.0–100.0)
Platelets: 587 10*3/uL — ABNORMAL HIGH (ref 150–400)
RBC: 3.73 MIL/uL — ABNORMAL LOW (ref 4.22–5.81)
RDW: 17.5 % — ABNORMAL HIGH (ref 11.5–15.5)
WBC: 11.5 10*3/uL — ABNORMAL HIGH (ref 4.0–10.5)
nRBC: 0 % (ref 0.0–0.2)

## 2023-03-30 LAB — URINALYSIS, COMPLETE (UACMP) WITH MICROSCOPIC
Bacteria, UA: NONE SEEN
Bilirubin Urine: NEGATIVE
Glucose, UA: NEGATIVE mg/dL
Hgb urine dipstick: NEGATIVE
Ketones, ur: NEGATIVE mg/dL
Leukocytes,Ua: NEGATIVE
Nitrite: NEGATIVE
Protein, ur: NEGATIVE mg/dL
Specific Gravity, Urine: 1.015 (ref 1.005–1.030)
pH: 5 (ref 5.0–8.0)

## 2023-03-30 LAB — BASIC METABOLIC PANEL
Anion gap: 8 (ref 5–15)
BUN: 63 mg/dL — ABNORMAL HIGH (ref 8–23)
CO2: 20 mmol/L — ABNORMAL LOW (ref 22–32)
Calcium: 8.2 mg/dL — ABNORMAL LOW (ref 8.9–10.3)
Chloride: 104 mmol/L (ref 98–111)
Creatinine, Ser: 3.29 mg/dL — ABNORMAL HIGH (ref 0.61–1.24)
GFR, Estimated: 19 mL/min — ABNORMAL LOW (ref >60)
Glucose, Bld: 119 mg/dL — ABNORMAL HIGH (ref 70–99)
Potassium: 4.9 mmol/L (ref 3.5–5.1)
Sodium: 132 mmol/L — ABNORMAL LOW (ref 135–145)

## 2023-03-30 LAB — GLUCOSE, CAPILLARY
Glucose-Capillary: 133 mg/dL — ABNORMAL HIGH (ref 70–99)
Glucose-Capillary: 169 mg/dL — ABNORMAL HIGH (ref 70–99)
Glucose-Capillary: 153 mg/dL — ABNORMAL HIGH (ref 70–99)
Glucose-Capillary: 167 mg/dL — ABNORMAL HIGH (ref 70–99)

## 2023-03-30 LAB — PROTEIN / CREATININE RATIO, URINE
Creatinine, Urine: 104 mg/dL
Protein Creatinine Ratio: 0.52 mg/mg{creat} — ABNORMAL HIGH (ref 0.00–0.15)
Total Protein, Urine: 54 mg/dL

## 2023-03-30 MED ORDER — FUROSEMIDE 10 MG/ML IJ SOLN
60.0000 mg | Freq: Once | INTRAMUSCULAR | Status: AC
  Administered 2023-03-30: 60 mg via INTRAVENOUS
  Filled 2023-03-30: qty 6

## 2023-03-30 NOTE — Progress Notes (Signed)
PROGRESS NOTE  [name] [name]  WUJ:[id number] DOB: [birth year] DOA: 03/25/2023 PCP: [name], MD   Brief Narrative: Patient is a 77 year old male with history of hypertension, coronary artery disease, paroxysmal A-fib/flutter, chronic systolic CHF, status post pacemaker placement, peripheral vascular disease, diabetes type 2, bladder cancer, CKD stage IIIb who presented from home with complaint of hip pain after the fall.  He was also admitted on 03/05/2023 and was discharged on 11/24 after he presented with fall at home, found to have right distal clavicle fracture that was managed nonoperatively.  Patient reported worsening progressive right hip pain for last 2 days, unable to ambulate.  On condition he was hemodynamically stable.  Lab work showed WBC count of 14.2, creatinine of 1.9, hemoglobin of 10.9.  CT hip showed evidence of right retroperitoneal hematoma extending into the distal right psoas and right iliacus muscle.  CT chest also showed multifocal groundglass opacities bilaterally.  Started on antibiotics for possible PNA.  PT/OT consulted, recommendation is acute inpatient rehab. Cardiology following for acute on chronic systolic CHF.  Nephrology also consulted.  Assessment & Plan:  Principal Problem:   Retroperitoneal hematoma  Fall/retroperitoneal hematoma:CT right hip showed Retroperitoneal hematoma extending into the distal right psoas and right iliacus muscle. CT imaging also showed multilocular acute to subacute retroperitoneal hematoma with a component noted superiorly along the posterolateral abdominal wall measuring 4.4 x 2.6 cm. Intramuscular hemorrhage within the right iliacus muscle with the dominant collection measuring 4.5 x 8.1 cm.  Continue pain management, supportive care.  This is most likely secondary to fall.  PT/OT consulted, AIR recommended.  Patient lives with his wife, ambulates with help of walker but has not been able to use it recently. Case was discussed  with general surgery by ED physician, no surgical intervention recommended.  AKI on CKD stage IIIb: Baseline creatinine around 2.5.  Kidney function trended up to 3 . Avoid nephrotoxins.  Most likely from cardiorenal syndrome.  She follows with [facility], nephrology also consulted, given need of persistent diuresis with IV medications and worsening kidney function.  Acute on Chronic systolic congestive heart failure: Elevated BNP.  Status post AICD.  Home torsemide,Entresto on hold.  Patient looks volume overloaded, cardiology following.  Given a dose of Lasix 60 mg again today Last echo done on 11/7 showed EF of 20 to 25%  Community acquired pneumonia: CT imaging showed multilevel groundglass infiltrates bilaterally.He  completed course of antibiotics.  Currently on 2-3 l of oxygen per minute.  He is not on oxygen at home.  Will wean the oxygen.  Right lower extremity swelling: Venous Doppler did not show any DVT  Elevated liver enzymes: Mild, continue to monitor  Leukocytosis: This most likely reactive.  Continue to monitor,improving  Atrial flutter/fibrillation: Monitor on telemetry.  Eliquis on hold due to finding of retroperitoneal hematoma.  Continue monitor. Likely hold Eliquis for a week.  Follows with cardiology as an outpatient.  Currently in normal sinus rhythm  Hypertension: Currently blood pressure soft.  Hold torsemide, Entresto   Coronary artery disease: Lipitor on hold due to elevated enzymes.  No anginal symptoms.  Type 2 diabetes: Monitor blood sugars, continue sliding scale  Lung nodule: CT imaging showed 16 mm nodule within the basilar right middle lobe, indeterminate. Repeat CT in  3 months or PET -CT     DVT prophylaxis:Place and maintain sequential compression device Start: 03/26/23 1608     Code Status: Full Code  Family Communication: Wife at bedside on 11/30  Patient status:Inpatient  Patient is from :Home  Anticipated discharge  to:[facility]  Estimated DC date:1-2 days, after adequate volume management   Consultants: Cardiology  Procedures:None  Antimicrobials:  Anti-infectives (From admission, onward)    Start     Dose/Rate Route Frequency Ordered Stop   03/27/23 0600  cefTRIAXone (ROCEPHIN) 1 g in sodium chloride 0.9 % 100 mL IVPB        1 g 200 mL/hr over 30 Minutes Intravenous Every 24 hours 03/26/23 0821 03/30/23 0549   03/27/23 0600  azithromycin (ZITHROMAX) 500 mg in sodium chloride 0.9 % 250 mL IVPB  Status:  Discontinued        500 mg 250 mL/hr over 60 Minutes Intravenous Every 24 hours 03/26/23 0821 03/28/23 0934   03/26/23 0615  cefTRIAXone (ROCEPHIN) 1 g in sodium chloride 0.9 % 100 mL IVPB        1 g 200 mL/hr over 30 Minutes Intravenous  Once 03/26/23 0600 03/26/23 0744   03/26/23 0615  azithromycin (ZITHROMAX) tablet 500 mg        500 mg Oral  Once 03/26/23 0600 03/26/23 0612       Subjective: Patient seen and examined at bedside today.  Hemodynamically stable.  Overall comfortable.  Lying in bed.  Does not complain of any shortness of breath or cough.  Appears volume overloaded though.  Has bilateral basal crackles.  Complains of some discomfort on the palate  objective: Vitals:   03/29/23 1644 03/29/23 2014 03/30/23 0350 03/30/23 0828  BP: (!) 132/52 (!) 121/47 (!) 109/51 (!) 102/45  Pulse: 62 (!) 58 (!) 58 (!) 57  Resp: 17 (!) 22 (!) 22 18  Temp: 98.7 F (37.1 C) 98.8 F (37.1 C) 98.5 F (36.9 C) 98.2 F (36.8 C)  TempSrc:  Oral Oral Oral  SpO2: 93% 95% 97% 94%  Weight:      Height:        Intake/Output Summary (Last 24 hours) at 03/30/2023 1116 Last data filed at 03/30/2023 0410 Gross per 24 hour  Intake 840 ml  Output 725 ml  Net 115 ml   Filed Weights   03/26/23 1847  Weight: 89.5 kg    Examination:  General exam: Overall comfortable, not in distress HEENT: PERRL Respiratory system: Diminished sounds bilaterally, fine crackles bilaterally Cardiovascular system:  S1 & S2 heard, RRR.  Gastrointestinal system: Abdomen is nondistended, soft and nontender. Central nervous system: Alert and oriented Extremities: Right lower extremity pitting edema, no clubbing ,no cyanosis Skin: No rashes, no ulcers,no icterus     Data Reviewed: I have personally reviewed following labs and imaging studies  CBC: Recent Labs  Lab 03/25/23 2025 03/27/23 0636 03/28/23 0548 03/29/23 0523 03/30/23 0233  WBC 14.2* 13.8* 13.7* 11.3* 11.5*  NEUTROABS 12.0*  --   --   --   --   HGB 10.9* 9.7* 9.8* 9.8* 9.8*  HCT 35.4* 29.9* 30.4* 30.8* 31.0*  MCV 85.5 82.8 83.3 83.5 83.1  PLT 555* 475* 463* 489* 587*   Basic Metabolic Panel: Recent Labs  Lab 03/25/23 2025 03/27/23 0636 03/28/23 0548 03/29/23 0523 03/30/23 0233  NA 133* 134* 131* 131* 132*  K 3.7 4.3 4.1 4.6 4.9  CL 105 105 101 103 104  CO2 21* 20* 18* 19* 20*  GLUCOSE 176* 101* 119* 118* 119*  BUN 32* 37* 48* 55* 63*  CREATININE 1.96* 2.25* 3.13* 3.26* 3.29*  CALCIUM 8.1* 8.1* 8.1* 8.2* 8.2*  MG 2.1  --   --   --   --  Recent Results (from the past 240 hour(s))  Respiratory (~20 pathogens) panel by PCR     Status: None   Collection Time: 03/26/23  9:47 AM   Specimen: Nasopharyngeal Swab; Respiratory  Result Value Ref Range Status   Adenovirus NOT DETECTED NOT DETECTED Final   Coronavirus 229E NOT DETECTED NOT DETECTED Final    Comment: (NOTE) The Coronavirus on the Respiratory Panel, DOES NOT test for the novel  Coronavirus (2019 nCoV)    Coronavirus HKU1 NOT DETECTED NOT DETECTED Final   Coronavirus NL63 NOT DETECTED NOT DETECTED Final   Coronavirus OC43 NOT DETECTED NOT DETECTED Final   Metapneumovirus NOT DETECTED NOT DETECTED Final   Rhinovirus / Enterovirus NOT DETECTED NOT DETECTED Final   Influenza A NOT DETECTED NOT DETECTED Final   Influenza B NOT DETECTED NOT DETECTED Final   Parainfluenza Virus 1 NOT DETECTED NOT DETECTED Final   Parainfluenza Virus 2 NOT DETECTED NOT DETECTED  Final   Parainfluenza Virus 3 NOT DETECTED NOT DETECTED Final   Parainfluenza Virus 4 NOT DETECTED NOT DETECTED Final   Respiratory Syncytial Virus NOT DETECTED NOT DETECTED Final   Bordetella pertussis NOT DETECTED NOT DETECTED Final   Bordetella Parapertussis NOT DETECTED NOT DETECTED Final   Chlamydophila pneumoniae NOT DETECTED NOT DETECTED Final   Mycoplasma pneumoniae NOT DETECTED NOT DETECTED Final    Comment: Performed at [facility], [address] [place], [place] [address]  SARS Coronavirus 2 by RT PCR (hospital order, performed in [facility] hospital lab) *cepheid single result test* Anterior Nasal Swab     Status: None   Collection Time: 03/26/23  9:47 AM   Specimen: Anterior Nasal Swab  Result Value Ref Range Status   SARS Coronavirus 2 by RT PCR NEGATIVE NEGATIVE Final    Comment: Performed at [facility], [address], [place], [place] [address]     Radiology Studies: No results found.  Scheduled Meds:  amiodarone  200 mg Oral Daily   guaiFENesin  600 mg Oral BID   insulin aspart  0-5 Units Subcutaneous QHS   insulin aspart  0-6 Units Subcutaneous TID WC   midodrine  10 mg Oral TID WC   polyethylene glycol  17 g Oral Daily   senna-docusate  1 tablet Oral BID   sodium bicarbonate  650 mg Oral BID   sodium chloride flush  3 mL Intravenous Q12H   Continuous Infusions:     LOS: 4 days   Burnadette Pop, MD [facility] P11/30/2024, 11:16 AM

## 2023-03-30 NOTE — Progress Notes (Signed)
Inpatient Rehab Admissions:  Inpatient Rehab Consult received.  I met with patient and wife [name] [name] at the bedside for rehabilitation assessment and to discuss goals and expectations of an inpatient rehab admission.  Discussed average length of stay and discharge home after completion of CIR. Both acknowledged understanding. Pt would like to pursue CIR. Wife supportive. Pt/wife confirmed wife will be able to provide 24/7 support for pt after discharge. Will continue to follow.  Signed: Wolfgang Phoenix, MS, CCC-SLP Admissions Coordinator [phone]

## 2023-03-30 NOTE — PMR Pre-admission (Signed)
PMR Admission Coordinator Pre-Admission Assessment  Patient: [name] [name] is an 77 y.o., male MRN: [id number] DOB: 06-13-1945 Height: 6\' 2"  (188 cm) Weight: 89.5 kg  Insurance Information HMO:     PPO:      PCP:      IPA:      80/20: yes     OTHER:  PRIMARY: Medicare A & B      Policy#: [id number]      Subscriber: patient CM Name:       Phone#:      Fax#:  Pre-Cert#:       Employer:  Benefits:  Phone #: verified eligibility via [employer] on 03/30/23     Name:  Eff. Date: Part A & B effective 07/30/10     Deduct: $1,632      Out of Pocket Max: NA      Life Max: NA CIR: 100% coverage      SNF: 100% coverage days 1-20, 80% coverage days 21-100 Outpatient: 80% coverage     Co-Pay: 20% Home Health: 100% coverage      Co-Pay:  DME: 80% coverage     Co-Pay: 20% Providers: pt's choice SECONDARY: [name] [name] EMP      Policy#: [id number]     Phone#: [phone]  Financial Counselor:       Phone#:   The "Data Collection Information Summary" for patients in Inpatient Rehabilitation Facilities with attached "Privacy Act Statement-Health Care Records" was provided and verbally reviewed with: Patient  Emergency Contact Information Contact Information     Name Relation Home Work Mobile   [name] [name] Spouse [phone]  [phone]      Other Contacts   None on File     Current Medical History  Patient Admitting Diagnosis: retroperitoneal hematoma History of Present Illness: Pt is a 77 year old male with medical hx significant for: HTN, CAD, paroxysmal A-fib/flutter, chronic systolic CHF, s/p CCM/PM, PVD, DM II, bladder CA, CKD stage IIIb, right distal clavicle fx.  Pt presented to [facility] on 03/25/23 d/t right hip pain. Pt recently admitted to hospital 11/4-11/8 after a fall resulting in right distal clavicle fx. X-rays of hip negative for fracture or dislocation at that time. Pt reported hip pain increased and and was having pain with weightbearing since returning home.   Hip x-ray negative for acute abnormalities. CT hip revealed retroperitoneal hematoma. CT abdomen/pelvis showed intramuscular hemorrhaging extending into iliacus muscle and iliopsoas on right. CT chest suggestive of PNA. Pt placed on 2L of O2 via nasal cannula. Cardiology consulted d/t acute on chronic systolic CHF. Started IV Lasix. Nephrology consulted d/t creatinine of 3.29.No HD indicated at this time. Pt with RLE swelling 04/08/23, venous duplex ordered.  Therapy evaluations completed and CIR recommended d/t pt's deficits in functional mobility.      Patient's medical record from [facility] has been reviewed by the rehabilitation admission coordinator and physician.  Past Medical History  Past Medical History:  Diagnosis Date   AICD (automatic cardioverter/defibrillator) present    Arthritis    Bladder cancer (HCC)    Borderline glaucoma    Chronic systolic (congestive) heart failure (HCC)    Coronary artery disease    Diabetes mellitus type 2, controlled (HCC) ORAL MED   Essential hypertension    Heart murmur    Hepatitis    Medical history non-contributory    Paroxysmal A-fib (HCC)    Paroxysmal A-fib (HCC) 08/20/2020   Peripheral vascular disease (HCC)    Pneumonia  Has the patient had major surgery during 100 days prior to admission? No  Family History   family history includes Heart attack in his sister; Heart disease in his sister.  Current Medications  Current Facility-Administered Medications:    albuterol (PROVENTIL) (2.5 MG/3ML) 0.083% nebulizer solution 2.5 mg, 2.5 mg, Nebulization, Q4H PRN, Foust, Katy L, NP   amiodarone (PACERONE) tablet 200 mg, 200 mg, Oral, Daily, Foust, Katy L, NP, 200 mg at 03/30/23 0102   benzonatate (TESSALON) capsule 200 mg, 200 mg, Oral, TID PRN, Foust, Katy L, NP   guaiFENesin (MUCINEX) 12 hr tablet 600 mg, 600 mg, Oral, BID, Foust, Katy L, NP, 600 mg at 03/30/23 0928   HYDROmorphone (DILAUDID) injection 0.5 mg, 0.5 mg,  Intravenous, Q3H PRN, Foust, Katy L, NP, 0.5 mg at 03/28/23 1531   insulin aspart (novoLOG) injection 0-5 Units, 0-5 Units, Subcutaneous, QHS, Foust, Katy L, NP   insulin aspart (novoLOG) injection 0-6 Units, 0-6 Units, Subcutaneous, TID WC, Foust, Katy L, NP, 1 Units at 03/29/23 1731   melatonin tablet 5 mg, 5 mg, Oral, QHS PRN, Foust, Katy L, NP, 5 mg at 03/29/23 2112   midodrine (PROAMATINE) tablet 10 mg, 10 mg, Oral, TID WC, Adhikari, Amrit, MD, 10 mg at 03/30/23 1224   ondansetron (ZOFRAN) tablet 4 mg, 4 mg, Oral, Q6H PRN **OR** ondansetron (ZOFRAN) injection 4 mg, 4 mg, Intravenous, Q6H PRN, Foust, Katy L, NP   oxyCODONE (Oxy IR/ROXICODONE) immediate release tablet 5 mg, 5 mg, Oral, Q4H PRN, Foust, Katy L, NP, 5 mg at 03/30/23 1230   polyethylene glycol (MIRALAX / GLYCOLAX) packet 17 g, 17 g, Oral, Daily PRN, Foust, Katy L, NP, 17 g at 03/29/23 0828   polyethylene glycol (MIRALAX / GLYCOLAX) packet 17 g, 17 g, Oral, Daily, Adhikari, Amrit, MD, 17 g at 03/30/23 7253   senna-docusate (Senokot-S) tablet 1 tablet, 1 tablet, Oral, BID, Adhikari, Amrit, MD, 1 tablet at 03/30/23 6644   sodium bicarbonate tablet 650 mg, 650 mg, Oral, BID, Adhikari, Amrit, MD, 650 mg at 03/30/23 0347   sodium chloride flush (NS) 0.9 % injection 3 mL, 3 mL, Intravenous, Q12H, Foust, Katy L, NP, 3 mL at 03/30/23 0929   sodium chloride flush (NS) 0.9 % injection 3 mL, 3 mL, Intravenous, PRN, Foust, Katy L, NP  Patients Current Diet:  Diet Order             Diet Heart Room service appropriate? Yes; Fluid consistency: Thin  Diet effective now                   Precautions / Restrictions Precautions Precautions: Fall Precaution Comments: watch BP and HR Restrictions Weight Bearing Restrictions:  (clarifying WB precautions with MD from prior hospital admission) RUE Weight Bearing: Non weight bearing Other Position/Activity Restrictions: pt 11/8 orders NWB with gentle ROM recommends. Requests clarification  from MD this session as pt reports using RW at home full WBat   Has the patient had 2 or more falls or a fall with injury in the past year? Yes  Prior Activity Level Limited Community (1-2x/wk): gets out of house ~1-2 days/week  Prior Functional Level Self Care: Did the patient need help bathing, dressing, using the toilet or eating? Independent  Indoor Mobility: Did the patient need assistance with walking from room to room (with or without device)? Independent  Stairs: Did the patient need assistance with internal or external stairs (with or without device)? Independent  Functional Cognition: Did the patient need help  planning regular tasks such as shopping or remembering to take medications? Independent  Patient Information Are you of Hispanic, Latino/a,or Spanish origin?: A. No, not of Hispanic, Latino/a, or Spanish origin What is your race?: A. White Do you need or want an interpreter to communicate with a doctor or health care staff?: 0. No  Patient's Response To:  Health Literacy and Transportation Is the patient able to respond to health literacy and transportation needs?: Yes Health Literacy - How often do you need to have someone help you when you read instructions, pamphlets, or other written material from your doctor or pharmacy?: Never In the past 12 months, has lack of transportation kept you from medical appointments or from getting medications?: No In the past 12 months, has lack of transportation kept you from meetings, work, or from getting things needed for daily living?: No  Home Assistive Devices / Equipment Home Equipment: Agricultural consultant (2 wheels), [employer] - single point, Personnel officer Use: Indicate devices/aids used by the patient prior to current illness, exacerbation or injury? Walker  Current Functional Level Cognition  Overall Cognitive Status: Within Functional Limits for tasks assessed Orientation Level: Oriented X4    Extremity  Assessment (includes Sensation/Coordination)  Upper Extremity Assessment: Right hand dominant, RUE deficits/detail RUE Deficits / Details: need further clarification on restrictions as pt using it without restrictions on arrival.  Lower Extremity Assessment: Generalized weakness, LLE deficits/detail LLE Deficits / Details: noted with standing needed cues for quad activation and knee extension    ADLs  Overall ADL's : Needs assistance/impaired Eating/Feeding: Modified independent Toilet Transfer: +2 for physical assistance, Moderate assistance, Stand-pivot, Regular Toilet Toilet Transfer Details (indicate cue type and reason): cues for hand placment and pt reaching back with L UE simulated RW to chair from eob    Mobility  Overal bed mobility: Needs Assistance Bed Mobility: Supine to Sit Supine to sit: Mod assist, HOB elevated, Used rails Sit to supine: Mod assist General bed mobility comments: ModA to complete moving LE's off EOB and for trunk elevation. Cues for sequencing    Transfers  Overall transfer level: Needs assistance Equipment used: Rolling walker (2 wheels) Transfers: Sit to/from Stand Sit to Stand: Mod assist, +2 physical assistance Bed to/from chair/wheelchair/BSC transfer type:: Step pivot Step pivot transfers: Min assist, +2 physical assistance General transfer comment: ModAx2 for boost up and steadying. Pt had difficulty standing upright with B knees tending to flex. Cues for upright posture and to straighten legs. Able to take steps towards recliner with RW managment and assist for steadying    Ambulation / Gait / Stairs / Wheelchair Mobility  Ambulation/Gait Ambulation/Gait assistance: Min assist, +2 physical assistance Gait Distance (Feet): 2 Feet Assistive device: Rolling walker (2 wheels) Gait Pattern/deviations: Step-to pattern, Decreased stride length, Shuffle General Gait Details: able to take steps with B knees bent and forward flexed trunk, MinAx2 for RW  management and steadying    Posture / Balance Dynamic Sitting Balance Sitting balance - Comments: R lateral lean initially with CGA, progressed to supervision for safety with upright posture Balance Overall balance assessment: Needs assistance, History of Falls Sitting-balance support: Feet supported, Bilateral upper extremity supported Sitting balance-Leahy Scale: Fair Sitting balance - Comments: R lateral lean initially with CGA, progressed to supervision for safety with upright posture Postural control: Right lateral lean Standing balance support: Reliant on assistive device for balance, During functional activity, Bilateral upper extremity supported Standing balance-Leahy Scale: Poor Standing balance comment: Reliant on external support  Special needs/care consideration Oxygen 2L via nasal cannula, Skin Abrasion: knee, leg, foot/right; Ecchymosis: arm/right, left; Erythema/Redness: leg/right, and Diabetic management Novolog 0-5 units daily at bedtime; Novolog 0-6 units 3x daily with  meals   Previous Home Environment (from acute therapy documentation) Living Arrangements: Spouse/significant other  Lives With: Spouse Available Help at Discharge: Family, Available 24 hours/day Type of Home: House Home Layout: Two level Alternate Level Stairs-Rails: Left Alternate Level Stairs-Number of Steps: flight Home Access: Stairs to enter Entrance Stairs-Rails: None Entrance Stairs-Number of Steps: 2 Bathroom Shower/Tub: Psychologist, counselling, Engineer, manufacturing systems: Handicapped height Bathroom Accessibility: Yes How Accessible: Accessible via walker Home Care Services: Yes Type of Home Care Services: Home PT  Discharge Living Setting Plans for Discharge Living Setting: Patient's home Type of Home at Discharge: House Discharge Home Layout: Two level Alternate Level Stairs-Rails: Left Alternate Level Stairs-Number of Steps: 14 Discharge Home Access: Stairs to enter Entrance  Stairs-Rails: None Entrance Stairs-Number of Steps: 2 Discharge Bathroom Shower/Tub: Walk-in shower, Tub/shower unit Discharge Bathroom Toilet: Handicapped height Discharge Bathroom Accessibility: Yes How Accessible: Accessible via walker Does the patient have any problems obtaining your medications?: No  Social/Family/Support Systems Anticipated Caregiver: [name] [name], wife Anticipated Caregiver's Contact Information: [phone] Caregiver Availability: 24/7 Discharge Plan Discussed with Primary Caregiver: Yes Is Caregiver In Agreement with Plan?: Yes Does Caregiver/Family have Issues with Lodging/Transportation while Pt is in Rehab?: No  Goals Patient/Family Goal for Rehab: PT/OT Supervision  Expected length of stay: 10-12 days  Pt/Family Agrees to Admission and willing to participate: Yes Program Orientation Provided & Reviewed with Pt/Caregiver Including Roles  & Responsibilities: Yes  Decrease burden of Care through IP rehab admission: NA  Possible need for SNF placement upon discharge: Not anticipated  Patient Condition: I have reviewed medical records from [facility], spoken with CM, and patient and spouse. I met with patient at the bedside for inpatient rehabilitation assessment.  Patient will benefit from ongoing PT and OT, can actively participate in 3 hours of therapy a day 5 days of the week, and can make measurable gains during the admission.  Patient will also benefit from the coordinated team approach during an Inpatient Acute Rehabilitation admission.  The patient will receive intensive therapy as well as Rehabilitation physician, nursing, social worker, and care management interventions.  Due to safety, skin/wound care, disease management, medication administration, pain management, and patient education the patient requires 24 hour a day rehabilitation nursing.  The patient is currently min A  with mobility and basic ADLs.  Discharge setting and therapy post  discharge at home with home health is anticipated.  Patient has agreed to participate in the Acute Inpatient Rehabilitation Program and will admit today.  Preadmission Screen Completed By:  Domingo Pulse, 03/30/2023 12:36 PM ______________________________________________________________________   Discussed status with Dr. Riley Kill  on 04/08/23 at 930 and received approval for admission today.  Admission Coordinator:  Domingo Pulse, CCC-SLP, time 1100/Date 04/08/23   Assessment/Plan: Diagnosis: debility related to retroperitoneal hematoma Does the need for close, 24 hr/day Medical supervision in concert with the patient's rehab needs make it unreasonable for this patient to be served in a less intensive setting? Yes Co-Morbidities requiring supervision/potential complications: HTN, PAF, csCHF, bladder CA, CKD IIIb Due to bladder management, bowel management, safety, skin/wound care, disease management, medication administration, pain management, and patient education, does the patient require 24 hr/day rehab nursing? Yes Does the patient require coordinated care of a physician, rehab nurse, PT, OT, and SLP  to address physical and functional deficits in the context of the above medical diagnosis(es)? Yes Addressing deficits in the following areas: balance, endurance, locomotion, strength, transferring, bowel/bladder control, bathing, dressing, feeding, grooming, toileting, and psychosocial support Can the patient actively participate in an intensive therapy program of at least 3 hrs of therapy 5 days a week? Yes The potential for patient to make measurable gains while on inpatient rehab is excellent Anticipated functional outcomes upon discharge from inpatient rehab: supervision PT, supervision OT, n/a SLP Estimated rehab length of stay to reach the above functional goals is: 10-12 days Anticipated discharge destination: Home 10. Overall Rehab/Functional Prognosis: excellent   MD  Signature: Ranelle Oyster, MD, [facility] Physical Medicine & Rehabilitation Medical Director Rehabilitation Services 04/08/2023

## 2023-03-30 NOTE — Plan of Care (Signed)
  Problem: Activity: Goal: Risk for activity intolerance will decrease Outcome: Progressing   Problem: Nutrition: Goal: Adequate nutrition will be maintained Outcome: Progressing   Problem: Pain Management: Goal: General experience of comfort will improve Outcome: Progressing   Problem: Safety: Goal: Ability to remain free from injury will improve Outcome: Progressing

## 2023-03-30 NOTE — Progress Notes (Signed)
Rounding Note    Patient Name: [name] [name] Date of Encounter: 03/30/2023  [facility] Cardiologist: Arvilla Meres, MD   Subjective   No complaints  Inpatient Medications    Scheduled Meds:  amiodarone  200 mg Oral Daily   guaiFENesin  600 mg Oral BID   insulin aspart  0-5 Units Subcutaneous QHS   insulin aspart  0-6 Units Subcutaneous TID WC   midodrine  10 mg Oral TID WC   polyethylene glycol  17 g Oral Daily   senna-docusate  1 tablet Oral BID   sodium bicarbonate  650 mg Oral BID   sodium chloride flush  3 mL Intravenous Q12H   Continuous Infusions:  PRN Meds: albuterol, benzonatate, HYDROmorphone (DILAUDID) injection, melatonin, ondansetron **OR** ondansetron (ZOFRAN) IV, oxyCODONE, polyethylene glycol, sodium chloride flush   Vital Signs    Vitals:   03/29/23 0825 03/29/23 1644 03/29/23 2014 03/30/23 0350  BP: (!) 111/51 (!) 132/52 (!) 121/47 (!) 109/51  Pulse:  62 (!) 58 (!) 58  Resp: 16 17 (!) 22 (!) 22  Temp:  98.7 F (37.1 C) 98.8 F (37.1 C) 98.5 F (36.9 C)  TempSrc:   Oral Oral  SpO2: 94% 93% 95% 97%  Weight:      Height:        Intake/Output Summary (Last 24 hours) at 03/30/2023 0813 Last data filed at 03/30/2023 0410 Gross per 24 hour  Intake 1080 ml  Output 975 ml  Net 105 ml      03/26/2023    6:47 PM 03/04/2023    9:24 PM 01/16/2023    9:26 AM  Last 3 Weights  Weight (lbs) 197 lb 5 oz 201 lb 210 lb  Weight (kg) 89.5 kg 91.173 kg 95.255 kg      Telemetry    SR - Personally Reviewed  ECG    N/a - Personally Reviewed  Physical Exam   GEN: No acute distress.   Neck: + JVD Cardiac: RRR, no murmurs, rubs, or gallops.  Respiratory: bilateral crackles GI: Soft, nontender, non-distended  MS: 2+ RLE edema Neuro:  Nonfocal  Psych: Normal affect   Labs    High Sensitivity Troponin:   Recent Labs  Lab 03/04/23 0015 03/04/23 2120  TROPONINIHS 39* 25*     Chemistry Recent Labs  Lab 03/25/23 2025  03/27/23 0636 03/28/23 0548 03/29/23 0523 03/30/23 0233  NA 133*   < > 131* 131* 132*  K 3.7   < > 4.1 4.6 4.9  CL 105   < > 101 103 104  CO2 21*   < > 18* 19* 20*  GLUCOSE 176*   < > 119* 118* 119*  BUN 32*   < > 48* 55* 63*  CREATININE 1.96*   < > 3.13* 3.26* 3.29*  CALCIUM 8.1*   < > 8.1* 8.2* 8.2*  MG 2.1  --   --   --   --   PROT 6.1*  --  5.5*  --   --   ALBUMIN 2.4*  --  2.0*  --   --   AST 74*  --  92*  --   --   ALT 85*  --  75*  --   --   ALKPHOS 115  --  99  --   --   BILITOT 1.1  --  0.9  --   --   GFRNONAA 35*   < > 20* 19* 19*  ANIONGAP 7   < >  12 9 8    < > = values in this interval not displayed.    Lipids No results for input(s): "CHOL", "TRIG", "HDL", "LABVLDL", "LDLCALC", "CHOLHDL" in the last 168 hours.  Hematology Recent Labs  Lab 03/28/23 0548 03/29/23 0523 03/30/23 0233  WBC 13.7* 11.3* 11.5*  RBC 3.65* 3.69* 3.73*  HGB 9.8* 9.8* 9.8*  HCT 30.4* 30.8* 31.0*  MCV 83.3 83.5 83.1  MCH 26.8 26.6 26.3  MCHC 32.2 31.8 31.6  RDW 17.4* 17.2* 17.5*  PLT 463* 489* 587*   Thyroid No results for input(s): "TSH", "FREET4" in the last 168 hours.  BNP Recent Labs  Lab 03/28/23 0548  BNP 2,038.1*    DDimer No results for input(s): "DDIMER" in the last 168 hours.   Radiology    DG CHEST PORT 1 VIEW  Result Date: 03/28/2023 CLINICAL DATA:  Short of breath. EXAM: PORTABLE CHEST 1 VIEW COMPARISON:  03/04/2023, 01/16/2023 and CT dated 03/26/2023. FINDINGS: Stable changes from prior cardiac surgery and stable cardiac enlargement. No mediastinal or hilar masses. Left and right anterior chest wall pacemakers/AICD are stable. Irregular interstitial opacities and mild hazy intervening airspace opacities are noted in the upper lobes, greater on the right. Minor basilar opacities are noted suspected to be atelectasis. Lungs are hyperexpanded but otherwise clear. No visualized pleural effusion.  No pneumothorax. IMPRESSION: 1. Interstitial hazy airspace lung opacities  in the upper lobes most evident on the right. On the recent prior chest CT, this was more apparent on the left. Consider asymmetric edema versus pneumonia. 2. No visualized pleural effusions. These may be improved from the recent prior chest CT or not visualized due to the AP technique. 3. No new abnormalities.  Stable cardiomegaly. Electronically Signed   By: Amie Portland M.D.   On: 03/28/2023 11:14    Cardiac Studies    Patient Profile     77 y.o. male with hx of COPD, type 2 diabetes, coronary artery disease status post CABG in 2018 with stage D systolic heart failure status post CCM, hypertension, history of bladder cancer and paroxysmal atrial fibrillation currently admitted with large retroperitoneal hematoma (on home Eliquis now held) and shortness of breath.   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 - 08/2022 showed EF 25-30%  - 03/2023 TEE: LVEF 20-25%, mild RV dysfunction   - from notes not on beta blocker due to bradycardia - hyperkalemia on MRA, also gynecomastia on aldactone - Not on SGLT2i due to yeast infections  - Not on Bi-Dil due to low BP   - low bp's this admission. Home entresto and torsemide held - 11/28 BNP up to 2038, on that day CXR asymmetric edema vs infection - received IV lasix 40mg  x 1 yesterday. I/Os incomplete. Weight pending. Slight uptrend in Cr - ongoing fluid overload, redose IV lasix 60. Accept higher Cr for now. If poor diuresis may need to consider low output.  - low bp's, has been started on midodrine 10mg  tid. MAPs 60s-70s. 11/28 lactic acid 1.2   2. Retroperitoneal bleed - admitted with bleed, had fall 2 weeks prior to admission - from notes manage conservatively, does not require intervention - Hgb stable at plateau of 9.8  3. Persistent aflutter - recently underwent DCCV on 11/7  - has been on oral amiodarone - Eliquis has been held due to retroperitoneal hematoma    4. CAD  - history of prior CABG in 2018, 2 vessel  5.  AKI on CKD - Baseline creatinine around  2.5     6. CAP - per primary team  7. Right leg edema - venous US negative this admission for DVT    For questions or updates, please contact [facility] Please consult [email or web address] for contact info under        Signed, Dina Rich, MD  03/30/2023, 8:13 AM

## 2023-03-30 NOTE — Consult Note (Signed)
[facility] Renal Consultation Note  Requesting MD: Adhikari Indication for Consultation: A on CRF  HPI:  [name] [name] is a 77 y.o. male with many medical issues to include type 2 diabetes mellitus, PAD, history of bladder cancer as well as a significant cardiac history to include coronary artery disease, atrial fibrillation with chronic systolic congestive heart failure status post CCM implant followed by advanced heart failure team.  He has had a high lot of issues/hospitalizations of late-admission 11/4 to 11/8 with syncope/atrial flutter.  He has noted to be on amiodarone, Entresto as well as torsemide as an outpatient.  Regarding kidney function-patient seemed to have some mild CKD with creatinine up to 1.4 through October 2021.  Then, in 2022-2024 creatinine seems to range from about 1.8-2.2.  He has not seen a nephrologist but is aware that he has some kidney weakness, managed by the heart failure team.  When he was hospitalized earlier this month with syncope and a flutter creatinine a little bit higher in the mid twos, was 2.36 upon discharge on 03/08/2023.  On 1125, he presented to medical attention after a bad fall-found to have a significant retroperitoneal hematoma with back and leg pain.  Initial creatinine was 1.96 but has risen since admission to a level of 3.29 today and that is the reason for consult.  When I review the record, but I notices that blood pressure is soft.  Sherryll Burger was held early on that hospitalization as well as diuretic.  His urine output did seem to get lower through November 29 but was noted to rebound yesterday with a total urine output of 1100.  Had been off and o on Lasix but did get 40 IV on Friday and 60 IV today in response to mostly right lower extremity edema which I think is due to inactivity.  Blood pressure still soft but it is systolic over 100.  Last urine was negative for blood and dipstick positive for protein at 100.  Patient denies any  urinary symptoms, he denies NSAIDs.  He also denies uremic symptoms.  The main issue is this back and leg pain  Creatinine, Ser  Date/Time Value Ref Range Status  03/30/2023 02:33 AM 3.29 (H) 0.61 - 1.24 mg/dL Final  16/01/9603 54:09 AM 3.26 (H) 0.61 - 1.24 mg/dL Final  81/19/1478 29:56 AM 3.13 (H) 0.61 - 1.24 mg/dL Final  21/30/8657 84:69 AM 2.25 (H) 0.61 - 1.24 mg/dL Final  62/95/2841 32:44 PM 1.96 (H) 0.61 - 1.24 mg/dL Final  05/02/7251 66:44 AM 2.36 (H) 0.61 - 1.24 mg/dL Final  03/47/4259 56:38 AM 2.14 (H) 0.61 - 1.24 mg/dL Final  75/64/3329 51:88 AM 2.33 (H) 0.61 - 1.24 mg/dL Final  41/66/0630 16:01 AM 2.44 (H) 0.61 - 1.24 mg/dL Final  09/32/3557 32:20 PM 2.52 (H) 0.61 - 1.24 mg/dL Final  25/42/7062 37:62 AM 2.18 (H) 0.61 - 1.24 mg/dL Final  83/15/1761 60:73 PM 2.02 (H) 0.61 - 1.24 mg/dL Final  71/09/2692 85:46 AM 2.12 (H) 0.61 - 1.24 mg/dL Final  27/06/5007 38:18 PM 2.60 (H) 0.61 - 1.24 mg/dL Final  29/93/7169 67:89 PM 2.27 (H) 0.76 - 1.27 mg/dL Final  38/01/1750 02:58 PM 2.27 (H) 0.61 - 1.24 mg/dL Final  52/77/8242 35:36 AM 1.80 (H) 0.76 - 1.27 mg/dL Final  14/43/1540 08:67 PM 2.15 (H) 0.61 - 1.24 mg/dL Final  61/95/0932 67:12 PM 2.15 (H) 0.61 - 1.24 mg/dL Final  45/80/9983 38:25 PM 1.88 (H) 0.61 - 1.24 mg/dL Final  05/39/7673 41:93 PM 1.85 (  H) 0.61 - 1.24 mg/dL Final  13/24/4010 27:25 AM 1.84 (H) 0.61 - 1.24 mg/dL Final  36/64/4034 74:25 PM 1.88 (H) 0.61 - 1.24 mg/dL Final  95/63/8756 43:32 AM 1.79 (H) 0.61 - 1.24 mg/dL Final  95/18/8416 60:63 AM 1.66 (H) 0.61 - 1.24 mg/dL Final  01/60/1093 23:55 AM 1.65 (H) 0.61 - 1.24 mg/dL Final  73/22/0254 27:06 AM 1.87 (H) 0.61 - 1.24 mg/dL Final  23/76/2831 51:76 PM 1.41 (H) 0.61 - 1.24 mg/dL Final  16/10/3708 62:69 AM 1.44 (H) 0.76 - 1.27 mg/dL Final  48/54/6270 35:00 AM 1.49 (H) 0.76 - 1.27 mg/dL Final  93/81/8299 37:16 AM 1.46 (H) 0.76 - 1.27 mg/dL Final  96/78/9381 01:75 PM 1.37 (H) 0.61 - 1.24 mg/dL Final  02/21/8526 78:24 PM  1.41 (H) 0.61 - 1.24 mg/dL Final  23/53/6144 31:54 PM 1.32 (H) 0.61 - 1.24 mg/dL Final  00/86/7619 50:93 PM 1.27 (H) 0.61 - 1.24 mg/dL Final  26/71/2458 09:98 PM 1.09 0.61 - 1.24 mg/dL Final  33/82/5053 97:67 AM 1.18 0.76 - 1.27 mg/dL Final  34/19/3790 24:09 PM 1.19 0.61 - 1.24 mg/dL Final  73/53/2992 42:68 PM 1.11 0.61 - 1.24 mg/dL Final  34/19/6222 97:98 PM 1.22 0.61 - 1.24 mg/dL Final  92/02/9416 40:81 PM 1.29 (H) 0.61 - 1.24 mg/dL Final  44/81/8563 14:97 PM 1.31 (H) 0.61 - 1.24 mg/dL Final  02/63/7858 85:02 AM 1.43 (H) 0.61 - 1.24 mg/dL Final  77/41/2878 67:67 AM 1.71 (H) 0.61 - 1.24 mg/dL Final  20/94/7096 28:36 AM 1.51 (H) 0.61 - 1.24 mg/dL Final  62/94/7654 65:03 PM 1.52 (H) 0.61 - 1.24 mg/dL Final  54/65/6812 75:17 PM 1.62 (H) 0.61 - 1.24 mg/dL Final  00/17/4944 96:75 AM 1.57 (H) 0.61 - 1.24 mg/dL Final  91/63/8466 59:93 AM 1.67 (H) 0.61 - 1.24 mg/dL Final  57/05/7791 90:30 AM 1.87 (H) 0.61 - 1.24 mg/dL Final  01/20/3006 62:26 AM 1.86 (H) 0.61 - 1.24 mg/dL Final  33/35/4562 56:38 AM 1.94 (H) 0.61 - 1.24 mg/dL Final     PMHx:   Past Medical History:  Diagnosis Date   AICD (automatic cardioverter/defibrillator) present    Arthritis    Bladder cancer (HCC)    Borderline glaucoma    Chronic systolic (congestive) heart failure (HCC)    Coronary artery disease    Diabetes mellitus type 2, controlled (HCC) ORAL MED   Essential hypertension    Heart murmur    Hepatitis    Medical history non-contributory    Paroxysmal A-fib (HCC)    Paroxysmal A-fib (HCC) 08/20/2020   Peripheral vascular disease (HCC)    Pneumonia     Past Surgical History:  Procedure Laterality Date   CARDIOVERSION N/A 08/10/2021   Procedure: CARDIOVERSION;  Surgeon: Dolores Patty, MD;  Location: [facility] ENDOSCOPY;  Service: Cardiovascular;  Laterality: N/A;   CARDIOVERSION N/A 11/09/2022   Procedure: CARDIOVERSION;  Surgeon: Maisie Fus, MD;  Location: [facility] INVASIVE CV LAB;  Service: Cardiovascular;   Laterality: N/A;   CARDIOVERSION N/A 03/07/2023   Procedure: CARDIOVERSION (CATH LAB);  Surgeon: Tessa Lerner, DO;  Location: [facility] INVASIVE CV LAB;  Service: Cardiovascular;  Laterality: N/A;   CAROTID DUPLEX SCAN  07-26-2010   BILATERAL ICA  STENOSIS 1% - 39%   CCM GENERATOR INSERTION N/A 01/04/2023   Procedure: CCM GENERATOR INSERTION;  Surgeon: Duke Salvia, MD;  Location: [facility] INVASIVE CV LAB;  Service: Cardiovascular;  Laterality: N/A;   CHOLECYSTECTOMY N/A 08/22/2020   Procedure: LAPAROSCOPIC CHOLECYSTECTOMY;  Surgeon: Emelia Loron, MD;  Location: [facility] OR;  Service: General;  Laterality: N/A;   CORONARY ARTERY BYPASS GRAFT N/A 04/08/2017   Procedure: CORONARY ARTERY BYPASS GRAFTING (CABG) TIMES TWO USING LEFT INTERNAL MAMMARY ARTERY AND LEFT SAPHENOUS LEG VEIN HARVESTED ENDOSCOPICALLY.  LEG VEIN ALSO HARVESTED FROM THE RIGHT LEG;  Surgeon: Alleen Borne, MD;  Location: [facility] OR;  Service: Open Heart Surgery;  Laterality: N/A;   CYSTOSCOPY WITH BIOPSY N/A 08/01/2012   Procedure: CYSTOSCOPY WITH BIOPSY BLADDER BIOPSY   ;  Surgeon: Antony Haste, MD;  Location: [facility];  Service: Urology;  Laterality: N/A;   ERCP N/A 08/21/2020   Procedure: ENDOSCOPIC RETROGRADE CHOLANGIOPANCREATOGRAPHY (ERCP);  Surgeon: Jeani Hawking, MD;  Location: [facility] ENDOSCOPY;  Service: Endoscopy;  Laterality: N/A;   FULGURATION OF BLADDER TUMOR N/A 08/01/2012   Procedure: FULGURATION OF BLADDER TUMOR;  Surgeon: Antony Haste, MD;  Location: [facility];  Service: Urology;  Laterality: N/A;   ICD IMPLANT  11/20/2017   ICD IMPLANT N/A 11/20/2017   Procedure: ICD IMPLANT;  Surgeon: Duke Salvia, MD;  Location: [facility] INVASIVE CV LAB;  Service: Cardiovascular;  Laterality: N/A;   LEFT HEART CATH AND CORONARY ANGIOGRAPHY N/A 03/31/2017   Procedure: LEFT HEART CATH AND CORONARY ANGIOGRAPHY;  Surgeon: Marykay Lex, MD;  Location: [facility] INVASIVE CV LAB;  Service: Cardiovascular;   Laterality: N/A;   LUMBAR LAMINECTOMY/DECOMPRESSION MICRODISCECTOMY Bilateral 06/16/2019   Procedure: Bilateral Lumbar Four-Five Laminectomy and Foraminotomy;  Surgeon: Julio Sicks, MD;  Location: [facility] OR;  Service: Neurosurgery;  Laterality: Bilateral;  posterior   REMOVAL OF STONES  08/21/2020   Procedure: REMOVAL OF STONES;  Surgeon: Jeani Hawking, MD;  Location: [facility] ENDOSCOPY;  Service: Endoscopy;;   RIGHT HEART CATH Right 03/31/2017   Procedure: RIGHT HEART CATH;  Surgeon: Marykay Lex, MD;  Location: [facility] INVASIVE CV LAB;  Service: Cardiovascular;  Laterality: Right;   SPHINCTEROTOMY  08/21/2020   Procedure: SPHINCTEROTOMY;  Surgeon: Jeani Hawking, MD;  Location: [facility] ENDOSCOPY;  Service: Endoscopy;;   TEE WITHOUT CARDIOVERSION N/A 04/08/2017   Procedure: TRANSESOPHAGEAL ECHOCARDIOGRAM (TEE);  Surgeon: Alleen Borne, MD;  Location: [facility] OR;  Service: Open Heart Surgery;  Laterality: N/A;   TRANSESOPHAGEAL ECHOCARDIOGRAM (CATH LAB) N/A 03/07/2023   Procedure: TRANSESOPHAGEAL ECHOCARDIOGRAM;  Surgeon: Tessa Lerner, DO;  Location: [facility] INVASIVE CV LAB;  Service: Cardiovascular;  Laterality: N/A;   TRANSURETHRAL RESECTION OF BLADDER TUMOR  05/29/2011   Procedure: TRANSURETHRAL RESECTION OF BLADDER TUMOR (TURBT);  Surgeon: Antony Haste, MD;  Location: [facility];  Service: Urology;  Laterality: N/A;   VENTRICULAR ASSIST DEVICE INSERTION Right 03/31/2017   Procedure: VENTRICULAR ASSIST DEVICE INSERTION;  Surgeon: Marykay Lex, MD;  Location: [facility] INVASIVE CV LAB;  Service: Cardiovascular;  Laterality: Right;    Family Hx:  Family History  Problem Relation Age of Onset   Heart attack Sister    Heart disease Sister     Social History:  reports that he quit smoking about 6 years ago. His smoking use included cigarettes. He started smoking about 46 years ago. He has a 40 pack-year smoking history. He has never used smokeless tobacco. He reports that he does not currently use  alcohol. He reports that he does not use drugs.  Allergies:  Allergies  Allergen Reactions   Spironolactone Other (See Comments)    Painful gynecomastia   Semaglutide     Nausea and bloating   Tramadol Other (See Comments)    Sleep disturbance.  Apnea  spells.   Empagliflozin Rash    Rash at the groin Jardiance   Niacin And Related Hives    Medications: Prior to Admission medications   Medication Sig Start Date End Date Taking? Authorizing Provider  acetaminophen (TYLENOL) 500 MG tablet Take 1,000 mg by mouth every 6 (six) hours as needed for mild pain (pain score 1-3), headache or fever.   Yes [provider]  amiodarone (PACERONE) 200 MG tablet Take 1 tablet (200 mg total) by mouth daily. Patient taking differently: Take 200 mg by mouth 2 (two) times daily. 03/08/23  Yes Ghimire, Werner Lean, MD  atorvastatin (LIPITOR) 80 MG tablet TAKE 1 TABLET BY MOUTH EVERY DAY 07/27/22  Yes Bensimhon, Bevelyn Buckles, MD  cyanocobalamin (VITAMIN B12) 250 MCG tablet Take 500 mcg by mouth daily.   Yes [provider]  ELIQUIS 5 MG TABS tablet TAKE 1 TABLET TWICE A DAY Patient taking differently: Take 5 mg by mouth 2 (two) times daily. 10/03/22  Yes Bensimhon, Bevelyn Buckles, MD  oxymetazoline (DRISTAN) 0.05 % nasal spray Place 1 spray into both nostrils at bedtime.   Yes [provider]  sacubitril-valsartan (ENTRESTO) 49-51 MG Take 1 tablet by mouth 2 (two) times daily. 01/08/23  Yes Bensimhon, Bevelyn Buckles, MD  torsemide (DEMADEX) 20 MG tablet Take 1 tablet (20 mg total) by mouth daily. 11/21/22  Yes Andrey Farmer, PA-C  zolpidem (AMBIEN) 10 MG tablet Take 5 mg by mouth at bedtime. 12/13/22  Yes [provider]    I have reviewed the patient's current medications.  Labs:  Results for orders placed or performed during the hospital encounter of 03/25/23 (from the past 48 hour(s))  Glucose, capillary     Status: Abnormal   Collection Time: 03/28/23 11:50 AM  Result Value Ref  Range   Glucose-Capillary 119 (H) 70 - 99 mg/dL    Comment: Glucose reference range applies only to samples taken after fasting for at least 8 hours.  Lactic acid, plasma     Status: None   Collection Time: 03/28/23  2:34 PM  Result Value Ref Range   Lactic Acid, Venous 1.4 0.5 - 1.9 mmol/L    Comment: Performed at [facility] Lab, [address] [place], [place] [address]  Glucose, capillary     Status: Abnormal   Collection Time: 03/28/23  4:52 PM  Result Value Ref Range   Glucose-Capillary 136 (H) 70 - 99 mg/dL    Comment: Glucose reference range applies only to samples taken after fasting for at least 8 hours.  Lactic acid, plasma     Status: None   Collection Time: 03/28/23  5:23 PM  Result Value Ref Range   Lactic Acid, Venous 1.2 0.5 - 1.9 mmol/L    Comment: Performed at [facility] Lab, [address] [place], [place] [address]  Glucose, capillary     Status: Abnormal   Collection Time: 03/28/23  8:23 PM  Result Value Ref Range   Glucose-Capillary 153 (H) 70 - 99 mg/dL    Comment: Glucose reference range applies only to samples taken after fasting for at least 8 hours.  CBC     Status: Abnormal   Collection Time: 03/29/23  5:23 AM  Result Value Ref Range   WBC 11.3 (H) 4.0 - 10.5 K/uL   RBC 3.69 (L) 4.22 - 5.81 MIL/uL   Hemoglobin 9.8 (L) 13.0 - 17.0 g/dL   HCT 09.8 (L) 11.9 - 14.7 %   MCV 83.5 80.0 -  100.0 fL   MCH 26.6 26.0 - 34.0 pg   MCHC 31.8 30.0 - 36.0 g/dL   RDW 33.2 (H) 95.1 - 88.4 %   Platelets 489 (H) 150 - 400 K/uL   nRBC 0.0 0.0 - 0.2 %    Comment: Performed at [facility] Lab, [address], [place], [place] [address]  Basic metabolic panel     Status: Abnormal   Collection Time: 03/29/23  5:23 AM  Result Value Ref Range   Sodium 131 (L) 135 - 145 mmol/L   Potassium 4.6 3.5 - 5.1 mmol/L   Chloride 103 98 - 111 mmol/L   CO2 19 (L) 22 - 32 mmol/L   Glucose, Bld 118 (H) 70 - 99 mg/dL    Comment: Glucose reference range applies only to  samples taken after fasting for at least 8 hours.   BUN 55 (H) 8 - 23 mg/dL   Creatinine, Ser 3.01 (H) 0.61 - 1.24 mg/dL   Calcium 8.2 (L) 8.9 - 10.3 mg/dL   GFR, Estimated 19 (L) >60 mL/min    Comment: (NOTE) Calculated using the CKD-EPI Creatinine Equation (2021)    Anion gap 9 5 - 15    Comment: Performed at [facility] Lab, [address] [place], [place] [address]  Glucose, capillary     Status: Abnormal   Collection Time: 03/29/23  8:45 AM  Result Value Ref Range   Glucose-Capillary 108 (H) 70 - 99 mg/dL    Comment: Glucose reference range applies only to samples taken after fasting for at least 8 hours.  Glucose, capillary     Status: Abnormal   Collection Time: 03/29/23 11:59 AM  Result Value Ref Range   Glucose-Capillary 141 (H) 70 - 99 mg/dL    Comment: Glucose reference range applies only to samples taken after fasting for at least 8 hours.  Glucose, capillary     Status: Abnormal   Collection Time: 03/29/23  4:41 PM  Result Value Ref Range   Glucose-Capillary 161 (H) 70 - 99 mg/dL    Comment: Glucose reference range applies only to samples taken after fasting for at least 8 hours.  Glucose, capillary     Status: Abnormal   Collection Time: 03/29/23  8:13 PM  Result Value Ref Range   Glucose-Capillary 176 (H) 70 - 99 mg/dL    Comment: Glucose reference range applies only to samples taken after fasting for at least 8 hours.  CBC     Status: Abnormal   Collection Time: 03/30/23  2:33 AM  Result Value Ref Range   WBC 11.5 (H) 4.0 - 10.5 K/uL   RBC 3.73 (L) 4.22 - 5.81 MIL/uL   Hemoglobin 9.8 (L) 13.0 - 17.0 g/dL   HCT 32.3 (L) 55.7 - 32.2 %   MCV 83.1 80.0 - 100.0 fL   MCH 26.3 26.0 - 34.0 pg   MCHC 31.6 30.0 - 36.0 g/dL   RDW 02.5 (H) 42.7 - 06.2 %   Platelets 587 (H) 150 - 400 K/uL   nRBC 0.0 0.0 - 0.2 %    Comment: Performed at [facility] Lab, [address] [place], [place] [address]  Basic metabolic panel     Status: Abnormal   Collection Time:  03/30/23  2:33 AM  Result Value Ref Range   Sodium 132 (L) 135 - 145 mmol/L   Potassium 4.9 3.5 - 5.1 mmol/L   Chloride 104 98 - 111 mmol/L   CO2 20 (L) 22 -  32 mmol/L   Glucose, Bld 119 (H) 70 - 99 mg/dL    Comment: Glucose reference range applies only to samples taken after fasting for at least 8 hours.   BUN 63 (H) 8 - 23 mg/dL   Creatinine, Ser 1.91 (H) 0.61 - 1.24 mg/dL   Calcium 8.2 (L) 8.9 - 10.3 mg/dL   GFR, Estimated 19 (L) >60 mL/min    Comment: (NOTE) Calculated using the CKD-EPI Creatinine Equation (2021)    Anion gap 8 5 - 15    Comment: Performed at [facility] Lab, [address] [place], [place] [address]  Glucose, capillary     Status: Abnormal   Collection Time: 03/30/23  8:29 AM  Result Value Ref Range   Glucose-Capillary 133 (H) 70 - 99 mg/dL    Comment: Glucose reference range applies only to samples taken after fasting for at least 8 hours.     ROS:  A comprehensive review of systems was negative except for: Musculoskeletal: positive for back pain and pain that s traveling into right leg from back  -  some right leg edema   Physical Exam: Vitals:   03/30/23 0350 03/30/23 0828  BP: (!) 109/51 (!) 102/45  Pulse: (!) 58 (!) 57  Resp: (!) 22 18  Temp: 98.5 F (36.9 C) 98.2 F (36.8 C)  SpO2: 97% 94%     General: Pale, frail elderly white male in no acute distress HEENT: Pupils equal round reactive to light, extraocular's are intact, mucous membranes are moist Neck: No JVD appreciated Heart: Bradycardic Lungs: Mostly clear with deep breathing Abdomen: Soft, nontender, nondistended Extremities: 1+ edema to right lower extremity, only trace at most edema to left lower extremity Skin: Warm and dry Neuro: Alert and nonfocal  Assessment/Plan: 77 year old white male with multiple medical issues including significant cardiac pathology.  He has CKD at baseline with creatinine around 2.  Now presents with acute on chronic renal failure in the setting of  fall with retroperitoneal hematoma and hypotension 1.Renal-has baseline CKD with creatinine of around 2.  This is likely secondary to cardio renal syndrome in the setting of decreased ejection fraction.  Last urine appears fairly bland but I will recheck sample and also quantify protein.  What I suspect is that more significant hypotension on Entresto has caused this most recent change in kidney function.  Urine output is improved and creatinine is fairly stable today so hoping that worsening plateau before improvement.  Even though he has a significant retroperitoneal hematoma it does not appear to be causing obstruction given his good urine output at this time.  I would continue to hold Entresto and will follow kidney function with you.  Fortunately, no uremic symptoms at this time 2. Hypertension/volume  -by exam, patient mostly has right lower extremity edema which I think is more a result of his referred pain from back and immobility.  However, BNP is elevated.  I am okay with moderate dose as needed Lasix.  Even with increased urine output creatinine stayed stable from yesterday to today.  Patient is already on midodrine to augment blood pressure 3. Anemia  -does not appear to be major issue at this time and has not required any treatment   Cecille Aver 03/30/2023, 9:08 AM

## 2023-03-31 DIAGNOSIS — I5023 Acute on chronic systolic (congestive) heart failure: Secondary | ICD-10-CM | POA: Diagnosis not present

## 2023-03-31 DIAGNOSIS — K683 Retroperitoneal hematoma: Secondary | ICD-10-CM | POA: Diagnosis not present

## 2023-03-31 LAB — LACTIC ACID, PLASMA: Lactic Acid, Venous: 1.2 mmol/L (ref 0.5–1.9)

## 2023-03-31 LAB — BRAIN NATRIURETIC PEPTIDE: B Natriuretic Peptide: 3018.3 pg/mL — ABNORMAL HIGH (ref 0.0–100.0)

## 2023-03-31 LAB — COMPREHENSIVE METABOLIC PANEL
ALT: 107 U/L — ABNORMAL HIGH (ref 0–44)
AST: 132 U/L — ABNORMAL HIGH (ref 15–41)
Albumin: 1.9 g/dL — ABNORMAL LOW (ref 3.5–5.0)
Alkaline Phosphatase: 113 U/L (ref 38–126)
Anion gap: 11 (ref 5–15)
BUN: 67 mg/dL — ABNORMAL HIGH (ref 8–23)
CO2: 18 mmol/L — ABNORMAL LOW (ref 22–32)
Calcium: 8.3 mg/dL — ABNORMAL LOW (ref 8.9–10.3)
Chloride: 101 mmol/L (ref 98–111)
Creatinine, Ser: 3.43 mg/dL — ABNORMAL HIGH (ref 0.61–1.24)
GFR, Estimated: 18 mL/min — ABNORMAL LOW (ref >60)
Glucose, Bld: 116 mg/dL — ABNORMAL HIGH (ref 70–99)
Potassium: 4 mmol/L (ref 3.5–5.1)
Sodium: 130 mmol/L — ABNORMAL LOW (ref 135–145)
Total Bilirubin: 1 mg/dL (ref <1.2)
Total Protein: 5.4 g/dL — ABNORMAL LOW (ref 6.5–8.1)

## 2023-03-31 LAB — GLUCOSE, CAPILLARY
Glucose-Capillary: 130 mg/dL — ABNORMAL HIGH (ref 70–99)
Glucose-Capillary: 139 mg/dL — ABNORMAL HIGH (ref 70–99)
Glucose-Capillary: 170 mg/dL — ABNORMAL HIGH (ref 70–99)

## 2023-03-31 MED ORDER — FUROSEMIDE 10 MG/ML IJ SOLN
40.0000 mg | Freq: Two times a day (BID) | INTRAMUSCULAR | Status: DC
  Administered 2023-03-31 – 2023-04-06 (×13): 40 mg via INTRAVENOUS
  Filled 2023-03-31 (×13): qty 4

## 2023-03-31 NOTE — Progress Notes (Signed)
Rounding Note    Patient Name: [name] [name] Date of Encounter: 03/31/2023  [facility] Cardiologist: Arvilla Meres, MD   Subjective   No complaints  Inpatient Medications    Scheduled Meds:  amiodarone  200 mg Oral Daily   guaiFENesin  600 mg Oral BID   insulin aspart  0-5 Units Subcutaneous QHS   insulin aspart  0-6 Units Subcutaneous TID WC   midodrine  10 mg Oral TID WC   polyethylene glycol  17 g Oral Daily   senna-docusate  1 tablet Oral BID   sodium bicarbonate  650 mg Oral BID   sodium chloride flush  3 mL Intravenous Q12H   Continuous Infusions:  PRN Meds: albuterol, benzonatate, HYDROmorphone (DILAUDID) injection, melatonin, ondansetron **OR** ondansetron (ZOFRAN) IV, oxyCODONE, polyethylene glycol, sodium chloride flush   Vital Signs    Vitals:   03/30/23 1957 03/31/23 0455 03/31/23 0500 03/31/23 0751  BP: (!) 115/53 (!) 115/54  (!) 118/55  Pulse: (!) 50 (!) 55  (!) 57  Resp: 18 17  16   Temp: 97.9 F (36.6 C) 98 F (36.7 C)  98.8 F (37.1 C)  TempSrc: Oral Oral    SpO2: 96% 91%  96%  Weight:   94.4 kg   Height:        Intake/Output Summary (Last 24 hours) at 03/31/2023 0806 Last data filed at 03/31/2023 0500 Gross per 24 hour  Intake 240 ml  Output 650 ml  Net -410 ml      03/31/2023    5:00 AM 03/26/2023    6:47 PM 03/04/2023    9:24 PM  Last 3 Weights  Weight (lbs) 208 lb 1.8 oz 197 lb 5 oz 201 lb  Weight (kg) 94.4 kg 89.5 kg 91.173 kg      Telemetry    NSR - Personally Reviewed  ECG    N/a - Personally Reviewed  Physical Exam   GEN: No acute distress.   Neck: + JVD Cardiac: RRR, no murmurs, rubs, or gallops.  Respiratory: crackles bilaterally GI: Soft, nontender, non-distended  MS: 2+ RLE edema Neuro:  Nonfocal  Psych: Normal affect   Labs    High Sensitivity Troponin:   Recent Labs  Lab 03/04/23 0015 03/04/23 2120  TROPONINIHS 39* 25*     Chemistry Recent Labs  Lab 03/25/23 2025  03/27/23 0636 03/28/23 0548 03/29/23 0523 03/30/23 0233  NA 133*   < > 131* 131* 132*  K 3.7   < > 4.1 4.6 4.9  CL 105   < > 101 103 104  CO2 21*   < > 18* 19* 20*  GLUCOSE 176*   < > 119* 118* 119*  BUN 32*   < > 48* 55* 63*  CREATININE 1.96*   < > 3.13* 3.26* 3.29*  CALCIUM 8.1*   < > 8.1* 8.2* 8.2*  MG 2.1  --   --   --   --   PROT 6.1*  --  5.5*  --   --   ALBUMIN 2.4*  --  2.0*  --   --   AST 74*  --  92*  --   --   ALT 85*  --  75*  --   --   ALKPHOS 115  --  99  --   --   BILITOT 1.1  --  0.9  --   --   GFRNONAA 35*   < > 20* 19* 19*  ANIONGAP 7   < >  12 9 8    < > = values in this interval not displayed.    Lipids No results for input(s): "CHOL", "TRIG", "HDL", "LABVLDL", "LDLCALC", "CHOLHDL" in the last 168 hours.  Hematology Recent Labs  Lab 03/28/23 0548 03/29/23 0523 03/30/23 0233  WBC 13.7* 11.3* 11.5*  RBC 3.65* 3.69* 3.73*  HGB 9.8* 9.8* 9.8*  HCT 30.4* 30.8* 31.0*  MCV 83.3 83.5 83.1  MCH 26.8 26.6 26.3  MCHC 32.2 31.8 31.6  RDW 17.4* 17.2* 17.5*  PLT 463* 489* 587*   Thyroid No results for input(s): "TSH", "FREET4" in the last 168 hours.  BNP Recent Labs  Lab 03/28/23 0548  BNP 2,038.1*    DDimer No results for input(s): "DDIMER" in the last 168 hours.   Radiology    No results found.  Cardiac Studies     Patient Profile     77 y.o. male with hx of COPD, type 2 diabetes, coronary artery disease status post CABG in 2018 with stage D systolic heart failure status post CCM, hypertension, history of bladder cancer and paroxysmal atrial fibrillation currently admitted with large retroperitoneal hematoma (on home Eliquis now held) and shortness of breath.   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 - 08/2022 showed EF 25-30%  - 03/2023 TEE: LVEF 20-25%, mild RV dysfunction     - from notes not on beta blocker due to bradycardia - hyperkalemia on MRA, also gynecomastia on aldactone - Not on SGLT2i due to yeast infections  - Not  on Bi-Dil due to low BP    - low bp's this admission. Home entresto and torsemide held - 11/28 BNP up to 2038, on that day CXR asymmetric edema vs infection  Just of uop yesterday, intake is incomplete. Reecived IV lasix 60mg  x 1. Labs are pending - low bp's, has been started on midodrine 10mg  tid. MAPs 70s. 11/28 lactic acid 1.2  - follow labs, pending renal function consider additional diuretic dosing. There is a possibility of significant low cardiac output that is limiting diuresis, pending clinical course may warrant central access with coox and CVP monitoring, considerations for inotrope.       2. Retroperitoneal bleed - admitted with bleed, had fall 2 weeks prior to admission - from notes manage conservatively, does not require intervention - Hgb stable at plateau of 9.8   3. Persistent aflutter - recently underwent DCCV on 11/7  - has been on oral amiodarone - Eliquis has been held due to retroperitoneal hematoma      4. CAD  - history of prior CABG in 2018, 2 vessel   5. AKI on CKD - Baseline creatinine around 2.5  - followed by nephrology - from renal note chronic disease likely significant component of cardiorenal, perhaps acute injury in setting of hypotension       6. CAP - per primary team   7. Right leg edema - venous US negative this admission for DVT  For questions or updates, please contact [facility] Please consult [email or web address] for contact info under        Signed, Dina Rich, MD  03/31/2023, 8:06 AM

## 2023-03-31 NOTE — Progress Notes (Addendum)
PROGRESS NOTE  [name] [name]  QMV:[id number] DOB: 03/23/1946 DOA: 03/25/2023 PCP: [name], MD   Brief Narrative: Patient is a 77 year old male with history of hypertension, coronary artery disease, paroxysmal A-fib/flutter, chronic systolic CHF, status post pacemaker placement, peripheral vascular disease, diabetes type 2, bladder cancer, CKD stage IIIb who presented from home with complaint of hip pain after the fall.  He was also admitted on 03/05/2023 and was discharged on 11/24 after he presented with fall at home, found to have right distal clavicle fracture that was managed nonoperatively.  Patient reported worsening progressive right hip pain for last 2 days, unable to ambulate.  On condition he was hemodynamically stable.  Lab work showed WBC count of 14.2, creatinine of 1.9, hemoglobin of 10.9.  CT hip showed evidence of right retroperitoneal hematoma extending into the distal right psoas and right iliacus muscle.  CT chest also showed multifocal groundglass opacities bilaterally.  Started on antibiotics for possible PNA.  PT/OT consulted, recommendation is acute inpatient rehab. Cardiology following for acute on chronic systolic CHF.  Nephrology also consulted.  Assessment & Plan:  Principal Problem:   Retroperitoneal hematoma  Fall/retroperitoneal hematoma:CT right hip showed Retroperitoneal hematoma extending into the distal right psoas and right iliacus muscle. CT imaging also showed multilocular acute to subacute retroperitoneal hematoma with a component noted superiorly along the posterolateral abdominal wall measuring 4.4 x 2.6 cm. Intramuscular hemorrhage within the right iliacus muscle with the dominant collection measuring 4.5 x 8.1 cm.  Continue pain management, supportive care.  This is most likely secondary to fall.  PT/OT consulted, AIR recommended.  Patient lives with his wife, ambulates with help of walker but has not been able to use it recently. Case was discussed  with general surgery by ED physician, no surgical intervention recommended.  AKI on CKD stage IIIb: Baseline creatinine around 2.5.  Kidney function trended up to 3 . Avoid nephrotoxins.  Most likely from cardiorenal syndrome.  He follows with [facility], nephrology also consulted, given need of persistent diuresis with IV medications and worsening kidney function. Creatinine trended further up today.  Continue IV Lasix  Acute on Chronic systolic congestive heart failure: Elevated BNP.  Status post AICD.  Home torsemide,Entresto on hold.  Patient looks volume overloaded, cardiology following.  Given a dose of Lasix 60 mg on 11/30 Last echo done on 11/7 showed EF of 20 to 25%.  Elevated BNP  Community acquired pneumonia: CT imaging showed multilevel groundglass infiltrates bilaterally.He  completed course of antibiotics.  Currently on 1L oxygen per minute.  He is not on oxygen at home.  Will wean the oxygen.  Right lower extremity swelling: Venous Doppler did not show any DVT  Elevated liver enzymes: Mild, continue to monitor  Leukocytosis: This most likely reactive.  Continue to monitor,improving  Atrial flutter/fibrillation: Monitor on telemetry.  Eliquis on hold due to finding of retroperitoneal hematoma.  Continue monitor. Likely hold Eliquis for a week.  Follows with cardiology as an outpatient.  Currently in normal sinus rhythm  Hypertension: Currently blood pressure soft.  Hold torsemide, Entresto   Coronary artery disease: Lipitor on hold due to elevated enzymes.  No anginal symptoms.  Type 2 diabetes: Monitor blood sugars, continue sliding scale  Lung nodule: CT imaging showed 16 mm nodule within the basilar right middle lobe, indeterminate. Repeat CT in  3 months or PET -CT     DVT prophylaxis:Place and maintain sequential compression device Start: 03/26/23 1608     Code Status:  Full Code  Family Communication: Wife at bedside on 11/30  Patient  status:Inpatient  Patient is from :Home  Anticipated discharge to:[facility]  Estimated DC date:1-2 days, after adequate volume management   Consultants: Cardiology, nephrology  Procedures:None  Antimicrobials:  Anti-infectives (From admission, onward)    Start     Dose/Rate Route Frequency Ordered Stop   03/27/23 0600  cefTRIAXone (ROCEPHIN) 1 g in sodium chloride 0.9 % 100 mL IVPB        1 g 200 mL/hr over 30 Minutes Intravenous Every 24 hours 03/26/23 0821 03/30/23 0549   03/27/23 0600  azithromycin (ZITHROMAX) 500 mg in sodium chloride 0.9 % 250 mL IVPB  Status:  Discontinued        500 mg 250 mL/hr over 60 Minutes Intravenous Every 24 hours 03/26/23 0821 03/28/23 0934   03/26/23 0615  cefTRIAXone (ROCEPHIN) 1 g in sodium chloride 0.9 % 100 mL IVPB        1 g 200 mL/hr over 30 Minutes Intravenous  Once 03/26/23 0600 03/26/23 0744   03/26/23 0615  azithromycin (ZITHROMAX) tablet 500 mg        500 mg Oral  Once 03/26/23 0600 03/26/23 0612       Subjective: Patient seen and examined at bedside today.  Hemodynamically stable.  Overall comfortable.  Lying in bed.  On 1 L of oxygen.  Does not complain of any worsening shortness of breath or cough.  Remains weak and deconditioned.  objective: Vitals:   03/30/23 1957 03/31/23 0455 03/31/23 0500 03/31/23 0751  BP: (!) 115/53 (!) 115/54  (!) 118/55  Pulse: (!) 50 (!) 55  (!) 57  Resp: 18 17  16   Temp: 97.9 F (36.6 C) 98 F (36.7 C)  98.8 F (37.1 C)  TempSrc: Oral Oral    SpO2: 96% 91%  96%  Weight:   94.4 kg   Height:        Intake/Output Summary (Last 24 hours) at 03/31/2023 1032 Last data filed at 03/31/2023 0700 Gross per 24 hour  Intake 510 ml  Output 1050 ml  Net -540 ml   Filed Weights   03/26/23 1847 03/31/23 0500  Weight: 89.5 kg 94.4 kg    Examination:   General exam: Pleasant elderly gentleman, lying in bed, weak, deconditioned HEENT: PERRL Respiratory system: Diminished air sounds on bases, fine  crackles Cardiovascular system: S1 & S2 heard, RRR.  Gastrointestinal system: Abdomen is nondistended, soft and nontender. Central nervous system: Alert and oriented Extremities: Right lower extremity pitting edema, no clubbing ,no cyanosis Skin: No rashes, no ulcers,no icterus     Data Reviewed: I have personally reviewed following labs and imaging studies  CBC: Recent Labs  Lab 03/25/23 2025 03/27/23 0636 03/28/23 0548 03/29/23 0523 03/30/23 0233  WBC 14.2* 13.8* 13.7* 11.3* 11.5*  NEUTROABS 12.0*  --   --   --   --   HGB 10.9* 9.7* 9.8* 9.8* 9.8*  HCT 35.4* 29.9* 30.4* 30.8* 31.0*  MCV 85.5 82.8 83.3 83.5 83.1  PLT 555* 475* 463* 489* 587*   Basic Metabolic Panel: Recent Labs  Lab 03/25/23 2025 03/27/23 0636 03/28/23 0548 03/29/23 0523 03/30/23 0233 03/31/23 0823  NA 133* 134* 131* 131* 132* 130*  K 3.7 4.3 4.1 4.6 4.9 4.0  CL 105 105 101 103 104 101  CO2 21* 20* 18* 19* 20* 18*  GLUCOSE 176* 101* 119* 118* 119* 116*  BUN 32* 37* 48* 55* 63* 67*  CREATININE 1.96* 2.25* 3.13* 3.26*  3.29* 3.43*  CALCIUM 8.1* 8.1* 8.1* 8.2* 8.2* 8.3*  MG 2.1  --   --   --   --   --      Recent Results (from the past 240 hour(s))  Respiratory (~20 pathogens) panel by PCR     Status: None   Collection Time: 03/26/23  9:47 AM   Specimen: Nasopharyngeal Swab; Respiratory  Result Value Ref Range Status   Adenovirus NOT DETECTED NOT DETECTED Final   Coronavirus 229E NOT DETECTED NOT DETECTED Final    Comment: (NOTE) The Coronavirus on the Respiratory Panel, DOES NOT test for the novel  Coronavirus (2019 nCoV)    Coronavirus HKU1 NOT DETECTED NOT DETECTED Final   Coronavirus NL63 NOT DETECTED NOT DETECTED Final   Coronavirus OC43 NOT DETECTED NOT DETECTED Final   Metapneumovirus NOT DETECTED NOT DETECTED Final   Rhinovirus / Enterovirus NOT DETECTED NOT DETECTED Final   Influenza A NOT DETECTED NOT DETECTED Final   Influenza B NOT DETECTED NOT DETECTED Final   Parainfluenza  Virus 1 NOT DETECTED NOT DETECTED Final   Parainfluenza Virus 2 NOT DETECTED NOT DETECTED Final   Parainfluenza Virus 3 NOT DETECTED NOT DETECTED Final   Parainfluenza Virus 4 NOT DETECTED NOT DETECTED Final   Respiratory Syncytial Virus NOT DETECTED NOT DETECTED Final   Bordetella pertussis NOT DETECTED NOT DETECTED Final   Bordetella Parapertussis NOT DETECTED NOT DETECTED Final   Chlamydophila pneumoniae NOT DETECTED NOT DETECTED Final   Mycoplasma pneumoniae NOT DETECTED NOT DETECTED Final    Comment: Performed at [facility], [address], [place], [place] [address]  SARS Coronavirus 2 by RT PCR (hospital order, performed in [facility] hospital lab) *cepheid single result test* Anterior Nasal Swab     Status: None   Collection Time: 03/26/23  9:47 AM   Specimen: Anterior Nasal Swab  Result Value Ref Range Status   SARS Coronavirus 2 by RT PCR NEGATIVE NEGATIVE Final    Comment: Performed at [facility], [address], [place], [place] [address]     Radiology Studies: No results found.  Scheduled Meds:  amiodarone  200 mg Oral Daily   guaiFENesin  600 mg Oral BID   insulin aspart  0-5 Units Subcutaneous QHS   insulin aspart  0-6 Units Subcutaneous TID WC   midodrine  10 mg Oral TID WC   polyethylene glycol  17 g Oral Daily   senna-docusate  1 tablet Oral BID   sodium bicarbonate  650 mg Oral BID   sodium chloride flush  3 mL Intravenous Q12H   Continuous Infusions:     LOS: 5 days   Burnadette Pop, MD [facility] P12/04/2022, 10:32 AM

## 2023-03-31 NOTE — Progress Notes (Signed)
Subjective:  at least 650 of urine, he thinks possibly more-  SBP has been better at over 110-  crt essentially stable -  urine yesterday negative for blood and protein-  tells me that he may be going to inpatient rehab  Objective Vital signs in last 24 hours: Vitals:   03/30/23 1957 03/31/23 0455 03/31/23 0500 03/31/23 0751  BP: (!) 115/53 (!) 115/54  (!) 118/55  Pulse: (!) 50 (!) 55  (!) 57  Resp: 18 17  16   Temp: 97.9 F (36.6 C) 98 F (36.7 C)  98.8 F (37.1 C)  TempSrc: Oral Oral    SpO2: 96% 91%  96%  Weight:   94.4 kg   Height:       Weight change:   Intake/Output Summary (Last 24 hours) at 03/31/2023 1610 Last data filed at 03/31/2023 0500 Gross per 24 hour  Intake 240 ml  Output 650 ml  Net -410 ml     Assessment/Plan: 77 year old white male with multiple medical issues including significant cardiac pathology.  He has CKD at baseline with creatinine around 2.  Now presents with acute on chronic renal failure in the setting of fall with retroperitoneal hematoma and hypotension 1.Renal-has baseline CKD with creatinine of around 2.  This is likely secondary to cardio renal syndrome in the setting of decreased ejection fraction.   urine bland - no protein.  What I suspect is that more significant hypotension on Entresto has caused this most recent change in kidney function.  Urine output is improved and creatinine is fairly stable today so hoping that worsening plateau before improvement.  Even though he has a significant retroperitoneal hematoma it does not appear to be causing obstruction given his good urine output at this time.  I would continue to hold Entresto and will follow kidney function with you.  Fortunately, no uremic symptoms at this time 2. Hypertension/volume  -by exam, patient mostly has right lower extremity edema which I think is more a result of his referred pain from back and immobility.  However, BNP is elevated.  I am okay with moderate dose as needed Lasix-   given 60 IV today.  Even with increased urine output creatinine stayed stable from yesterday to today.  Patient is already on midodrine to augment blood pressure-  would continue-   no overt low BPs last 24 hours 3. Anemia  -does not appear to be major issue at this time and has not required any treatment 4. Hyponatremia -  likely hypervolemic-  has improved from yesterday      Cecille Aver    Labs: Basic Metabolic Panel: Recent Labs  Lab 03/28/23 0548 03/29/23 0523 03/30/23 0233  NA 131* 131* 132*  K 4.1 4.6 4.9  CL 101 103 104  CO2 18* 19* 20*  GLUCOSE 119* 118* 119*  BUN 48* 55* 63*  CREATININE 3.13* 3.26* 3.29*  CALCIUM 8.1* 8.2* 8.2*   Liver Function Tests: Recent Labs  Lab 03/25/23 2025 03/28/23 0548  AST 74* 92*  ALT 85* 75*  ALKPHOS 115 99  BILITOT 1.1 0.9  PROT 6.1* 5.5*  ALBUMIN 2.4* 2.0*   No results for input(s): "LIPASE", "AMYLASE" in the last 168 hours. No results for input(s): "AMMONIA" in the last 168 hours. CBC: Recent Labs  Lab 03/25/23 2025 03/27/23 0636 03/28/23 0548 03/29/23 0523 03/30/23 0233  WBC 14.2* 13.8* 13.7* 11.3* 11.5*  NEUTROABS 12.0*  --   --   --   --   HGB  10.9* 9.7* 9.8* 9.8* 9.8*  HCT 35.4* 29.9* 30.4* 30.8* 31.0*  MCV 85.5 82.8 83.3 83.5 83.1  PLT 555* 475* 463* 489* 587*   Cardiac Enzymes: No results for input(s): "CKTOTAL", "CKMB", "CKMBINDEX", "TROPONINI" in the last 168 hours. CBG: Recent Labs  Lab 03/30/23 0829 03/30/23 1200 03/30/23 1713 03/30/23 2209 03/31/23 0821  GLUCAP 133* 169* 153* 167* 130*    Iron Studies: No results for input(s): "IRON", "TIBC", "TRANSFERRIN", "FERRITIN" in the last 72 hours. Studies/Results: No results found. Medications: Infusions:   Scheduled Medications:  amiodarone  200 mg Oral Daily   guaiFENesin  600 mg Oral BID   insulin aspart  0-5 Units Subcutaneous QHS   insulin aspart  0-6 Units Subcutaneous TID WC   midodrine  10 mg Oral TID WC   polyethylene  glycol  17 g Oral Daily   senna-docusate  1 tablet Oral BID   sodium bicarbonate  650 mg Oral BID   sodium chloride flush  3 mL Intravenous Q12H    have reviewed scheduled and prn medications.  Physical Exam: General: pale, decreased bed mobility Heart: RRR Lungs: dec BS at bases Abdomen: soft,non tender Extremities: edema really only on RLE    03/31/2023,8:38 AM  LOS: 5 days

## 2023-03-31 NOTE — Plan of Care (Signed)
  Problem: Nutrition: Goal: Adequate nutrition will be maintained Outcome: Progressing   Problem: Elimination: Goal: Will not experience complications related to bowel motility Outcome: Progressing   Problem: Elimination: Goal: Will not experience complications related to urinary retention Outcome: Progressing   Problem: Pain Management: Goal: General experience of comfort will improve Outcome: Progressing   Problem: Safety: Goal: Ability to remain free from injury will improve Outcome: Progressing   Problem: Skin Integrity: Goal: Risk for impaired skin integrity will decrease Outcome: Progressing

## 2023-04-01 ENCOUNTER — Inpatient Hospital Stay (HOSPITAL_COMMUNITY): Payer: Medicare Other

## 2023-04-01 DIAGNOSIS — I5043 Acute on chronic combined systolic (congestive) and diastolic (congestive) heart failure: Secondary | ICD-10-CM

## 2023-04-01 DIAGNOSIS — I509 Heart failure, unspecified: Secondary | ICD-10-CM

## 2023-04-01 DIAGNOSIS — N1832 Chronic kidney disease, stage 3b: Secondary | ICD-10-CM | POA: Diagnosis not present

## 2023-04-01 DIAGNOSIS — N179 Acute kidney failure, unspecified: Secondary | ICD-10-CM | POA: Diagnosis not present

## 2023-04-01 DIAGNOSIS — K683 Retroperitoneal hematoma: Secondary | ICD-10-CM | POA: Diagnosis not present

## 2023-04-01 HISTORY — PX: IR US GUIDE VASC ACCESS RIGHT: IMG2390

## 2023-04-01 HISTORY — PX: IR FLUORO GUIDE CV LINE RIGHT: IMG2283

## 2023-04-01 LAB — CBC
HCT: 30.2 % — ABNORMAL LOW (ref 39.0–52.0)
Hemoglobin: 9.9 g/dL — ABNORMAL LOW (ref 13.0–17.0)
MCH: 27.2 pg (ref 26.0–34.0)
MCHC: 32.8 g/dL (ref 30.0–36.0)
MCV: 83 fL (ref 80.0–100.0)
Platelets: 742 10*3/uL — ABNORMAL HIGH (ref 150–400)
RBC: 3.64 MIL/uL — ABNORMAL LOW (ref 4.22–5.81)
RDW: 17.2 % — ABNORMAL HIGH (ref 11.5–15.5)
WBC: 17.2 10*3/uL — ABNORMAL HIGH (ref 4.0–10.5)
nRBC: 0 % (ref 0.0–0.2)

## 2023-04-01 LAB — GLUCOSE, CAPILLARY
Glucose-Capillary: 112 mg/dL — ABNORMAL HIGH (ref 70–99)
Glucose-Capillary: 130 mg/dL — ABNORMAL HIGH (ref 70–99)
Glucose-Capillary: 99 mg/dL (ref 70–99)
Glucose-Capillary: 107 mg/dL — ABNORMAL HIGH (ref 70–99)
Glucose-Capillary: 215 mg/dL — ABNORMAL HIGH (ref 70–99)
Glucose-Capillary: 322 mg/dL — ABNORMAL HIGH (ref 70–99)

## 2023-04-01 LAB — COOXEMETRY PANEL
Carboxyhemoglobin: 1 % (ref 0.5–1.5)
Methemoglobin: <0.7 % (ref 0.0–1.5)
O2 Saturation: 64.6 %
Total hemoglobin: 10.6 g/dL — ABNORMAL LOW (ref 12.0–16.0)

## 2023-04-01 MED ORDER — PREDNISONE 20 MG PO TABS
20.0000 mg | ORAL_TABLET | Freq: Every day | ORAL | Status: AC
  Administered 2023-04-02: 20 mg via ORAL
  Filled 2023-04-01: qty 1

## 2023-04-01 MED ORDER — MILRINONE LACTATE IN DEXTROSE 20-5 MG/100ML-% IV SOLN
0.2500 ug/kg/min | INTRAVENOUS | Status: DC
  Administered 2023-04-01 – 2023-04-05 (×7): 0.25 ug/kg/min via INTRAVENOUS
  Filled 2023-04-01 (×9): qty 100

## 2023-04-01 MED ORDER — SODIUM CHLORIDE 0.9% FLUSH
10.0000 mL | Freq: Two times a day (BID) | INTRAVENOUS | Status: DC
Start: 2023-04-01 — End: 2023-04-08
  Administered 2023-04-01 (×2): 10 mL
  Administered 2023-04-02: 20 mL
  Administered 2023-04-03 – 2023-04-08 (×9): 10 mL

## 2023-04-01 MED ORDER — HYDROCORTISONE 1 % EX CREA
TOPICAL_CREAM | Freq: Three times a day (TID) | CUTANEOUS | Status: DC
  Filled 2023-04-01 (×3): qty 28

## 2023-04-01 MED ORDER — HEPARIN SOD (PORK) LOCK FLUSH 100 UNIT/ML IV SOLN
INTRAVENOUS | Status: AC
  Filled 2023-04-01: qty 5

## 2023-04-01 MED ORDER — LIDOCAINE HCL 1 % IJ SOLN
INTRAMUSCULAR | Status: AC
  Filled 2023-04-01: qty 20

## 2023-04-01 MED ORDER — LIDOCAINE HCL 1 % IJ SOLN
10.0000 mL | Freq: Once | INTRAMUSCULAR | Status: AC
  Administered 2023-04-01: 10 mL via INTRADERMAL

## 2023-04-01 MED ORDER — PREDNISONE 10 MG PO TABS
10.0000 mg | ORAL_TABLET | Freq: Every day | ORAL | Status: AC
  Administered 2023-04-03: 10 mg via ORAL
  Filled 2023-04-01: qty 1

## 2023-04-01 MED ORDER — PREDNISONE 20 MG PO TABS
40.0000 mg | ORAL_TABLET | Freq: Every day | ORAL | Status: AC
  Administered 2023-04-01: 40 mg via ORAL
  Filled 2023-04-01: qty 2

## 2023-04-01 MED ORDER — CHLORHEXIDINE GLUCONATE CLOTH 2 % EX PADS
6.0000 | MEDICATED_PAD | Freq: Every day | CUTANEOUS | Status: DC
  Administered 2023-04-01 – 2023-04-08 (×8): 6 via TOPICAL

## 2023-04-01 MED ORDER — SODIUM CHLORIDE 0.9% FLUSH: 10.0000 mL | INTRAVENOUS | Status: DC | PRN

## 2023-04-01 NOTE — Progress Notes (Signed)
Patient transfer to 2C09. Report given to RN. Patient wife at bedside. All belongings given to patient wife.

## 2023-04-01 NOTE — Plan of Care (Signed)
  Problem: Pain Management: Goal: General experience of comfort will improve Outcome: Progressing   Problem: Safety: Goal: Ability to remain free from injury will improve Outcome: Progressing

## 2023-04-01 NOTE — Progress Notes (Signed)
Transfer pt to [phone], gave report to RN. Pt has all belonging at bedside. Nurse meet me in the room, show nurse his rash on his chest as well.

## 2023-04-01 NOTE — Progress Notes (Addendum)
Rounding Note    Patient Name: [name] [name] Date of Encounter: 04/01/2023  [facility] Cardiologist: Arvilla Meres, MD   Subjective   Very weak, denies SOB but does not wear O2 at home, no chest pain  Inpatient Medications    Scheduled Meds:  amiodarone  200 mg Oral Daily   furosemide  40 mg Intravenous BID   guaiFENesin  600 mg Oral BID   insulin aspart  0-5 Units Subcutaneous QHS   insulin aspart  0-6 Units Subcutaneous TID WC   midodrine  10 mg Oral TID WC   polyethylene glycol  17 g Oral Daily   senna-docusate  1 tablet Oral BID   sodium bicarbonate  650 mg Oral BID   sodium chloride flush  3 mL Intravenous Q12H   Continuous Infusions:  PRN Meds: albuterol, benzonatate, HYDROmorphone (DILAUDID) injection, melatonin, ondansetron **OR** ondansetron (ZOFRAN) IV, oxyCODONE, polyethylene glycol, sodium chloride flush   Vital Signs    Vitals:   03/31/23 1824 03/31/23 2200 04/01/23 0500 04/01/23 0751  BP: (!) 124/55 (!) 117/51 (!) 112/50 (!) 123/55  Pulse: (!) 52 (!) 52 (!) 52   Resp: 19 18 18 18   Temp:  98.2 F (36.8 C) 98.2 F (36.8 C) 98.5 F (36.9 C)  TempSrc:  Oral Oral Oral  SpO2: 95% 92% 93% 95%  Weight:   94.9 kg   Height:        Intake/Output Summary (Last 24 hours) at 04/01/2023 1051 Last data filed at 04/01/2023 0755 Gross per 24 hour  Intake 240 ml  Output --  Net 240 ml       04/01/2023    5:00 AM 03/31/2023    5:00 AM 03/26/2023    6:47 PM  Last 3 Weights  Weight (lbs) 209 lb 3.5 oz 208 lb 1.8 oz 197 lb 5 oz  Weight (kg) 94.9 kg 94.4 kg 89.5 kg      Telemetry    SR - Personally Reviewed  ECG    N/a - Personally Reviewed  Physical Exam   GEN: No acute distress.   Neck: JVD 10 cm Cardiac: RRR, 2/6 murmur, no rubs, or gallops.  Respiratory: crackles bilateral bases GI: Soft, nontender, non-distended  MS: 2+ RLE edema Neuro:  Nonfocal  Psych: Normal affect   Labs    High Sensitivity Troponin:   Recent Labs   Lab 03/04/23 0015 03/04/23 2120  TROPONINIHS 39* 25*     Chemistry Recent Labs  Lab 03/25/23 2025 03/27/23 0636 03/28/23 0548 03/29/23 0523 03/30/23 0233 03/31/23 0823  NA 133*   < > 131* 131* 132* 130*  K 3.7   < > 4.1 4.6 4.9 4.0  CL 105   < > 101 103 104 101  CO2 21*   < > 18* 19* 20* 18*  GLUCOSE 176*   < > 119* 118* 119* 116*  BUN 32*   < > 48* 55* 63* 67*  CREATININE 1.96*   < > 3.13* 3.26* 3.29* 3.43*  CALCIUM 8.1*   < > 8.1* 8.2* 8.2* 8.3*  MG 2.1  --   --   --   --   --   PROT 6.1*  --  5.5*  --   --  5.4*  ALBUMIN 2.4*  --  2.0*  --   --  1.9*  AST 74*  --  92*  --   --  132*  ALT 85*  --  75*  --   --  107*  ALKPHOS 115  --  99  --   --  113  BILITOT 1.1  --  0.9  --   --  1.0  GFRNONAA 35*   < > 20* 19* 19* 18*  ANIONGAP 7   < > 12 9 8 11    < > = values in this interval not displayed.    Lipids No results for input(s): "CHOL", "TRIG", "HDL", "LABVLDL", "LDLCALC", "CHOLHDL" in the last 168 hours.  Hematology Recent Labs  Lab 03/28/23 0548 03/29/23 0523 03/30/23 0233  WBC 13.7* 11.3* 11.5*  RBC 3.65* 3.69* 3.73*  HGB 9.8* 9.8* 9.8*  HCT 30.4* 30.8* 31.0*  MCV 83.3 83.5 83.1  MCH 26.8 26.6 26.3  MCHC 32.2 31.8 31.6  RDW 17.4* 17.2* 17.5*  PLT 463* 489* 587*   Thyroid No results for input(s): "TSH", "FREET4" in the last 168 hours.  BNP Recent Labs  Lab 03/28/23 0548 03/31/23 0823  BNP 2,038.1* 3,018.3*    DDimer No results for input(s): "DDIMER" in the last 168 hours.   Radiology    IR Fluoro Guide CV Line Right  Result Date: 04/01/2023 INDICATION: Heart failure patient, Milrinone therapy access EXAM: TUNNELED PICC LINE WITH ULTRASOUND AND FLUOROSCOPIC GUIDANCE MEDICATIONS: 1% lidocaine local. ANESTHESIA/SEDATION: None. FLUOROSCOPY: Radiation Exposure Index (as provided by the fluoroscopic device): 9.0 mGy Kerma COMPLICATIONS: None immediate. PROCEDURE: Informed written consent was obtained from the patient after a discussion of the risks,  benefits, and alternatives to treatment. Questions regarding the procedure were encouraged and answered. The right neck and chest were prepped with chlorhexidine in a sterile fashion, and a sterile drape was applied covering the operative field. Maximum barrier sterile technique with sterile gowns and gloves were used for the procedure. A timeout was performed prior to the initiation of the procedure. After creating a small venotomy incision, a micropuncture kit was utilized to access the right internal jugular vein under direct, real-time ultrasound guidance after the overlying soft tissues were anesthetized with 1% lidocaine with epinephrine. Ultrasound image documentation was performed. The microwire was kinked to measure appropriate catheter length. The micropuncture sheath was exchanged for a peel-away sheath over a guidewire. A 5 French dual lumen tunneled PICC measuring 28 cm was tunneled in a retrograde fashion from the anterior chest wall to the venotomy incision. The catheter was then placed through the peel-away sheath with tip ultimately positioned at the superior caval-atrial junction. Final catheter positioning was confirmed and documented with a spot radiographic image. The catheter aspirates and flushes normally. The catheter was flushed with appropriate volume heparin dwells. The catheter exit site was secured with a 0-Prolene retention suture. The venotomy incision was closed with an interrupted 4-0 Vicryl, Dermabond and Steri-strips. Dressings were applied. The patient tolerated the procedure well without immediate post procedural complication. FINDINGS: After catheter placement, the tip lies within the superior cavoatrial junction. The catheter aspirates and flushes normally and is ready for immediate use. IMPRESSION: Successful placement of 28cm dual lumen tunneled PICC catheter via the right internal jugular vein with tip terminating at the superior caval atrial junction. The catheter is ready  for immediate use. Electronically Signed   By: Judie Petit.  Shick M.D.   On: 04/01/2023 10:07   IR US Guide Vasc Access Right  Result Date: 04/01/2023 INDICATION: Heart failure patient, Milrinone therapy access EXAM: TUNNELED PICC LINE WITH ULTRASOUND AND FLUOROSCOPIC GUIDANCE MEDICATIONS: 1% lidocaine local. ANESTHESIA/SEDATION: None. FLUOROSCOPY: Radiation Exposure Index (as provided by the fluoroscopic device): 9.0 mGy Kerma COMPLICATIONS: None  immediate. PROCEDURE: Informed written consent was obtained from the patient after a discussion of the risks, benefits, and alternatives to treatment. Questions regarding the procedure were encouraged and answered. The right neck and chest were prepped with chlorhexidine in a sterile fashion, and a sterile drape was applied covering the operative field. Maximum barrier sterile technique with sterile gowns and gloves were used for the procedure. A timeout was performed prior to the initiation of the procedure. After creating a small venotomy incision, a micropuncture kit was utilized to access the right internal jugular vein under direct, real-time ultrasound guidance after the overlying soft tissues were anesthetized with 1% lidocaine with epinephrine. Ultrasound image documentation was performed. The microwire was kinked to measure appropriate catheter length. The micropuncture sheath was exchanged for a peel-away sheath over a guidewire. A 5 French dual lumen tunneled PICC measuring 28 cm was tunneled in a retrograde fashion from the anterior chest wall to the venotomy incision. The catheter was then placed through the peel-away sheath with tip ultimately positioned at the superior caval-atrial junction. Final catheter positioning was confirmed and documented with a spot radiographic image. The catheter aspirates and flushes normally. The catheter was flushed with appropriate volume heparin dwells. The catheter exit site was secured with a 0-Prolene retention suture. The  venotomy incision was closed with an interrupted 4-0 Vicryl, Dermabond and Steri-strips. Dressings were applied. The patient tolerated the procedure well without immediate post procedural complication. FINDINGS: After catheter placement, the tip lies within the superior cavoatrial junction. The catheter aspirates and flushes normally and is ready for immediate use. IMPRESSION: Successful placement of 28cm dual lumen tunneled PICC catheter via the right internal jugular vein with tip terminating at the superior caval atrial junction. The catheter is ready for immediate use. Electronically Signed   By: Judie Petit.  Shick M.D.   On: 04/01/2023 10:07    Cardiac Studies     Patient Profile     77 y.o. male with hx of COPD, type 2 diabetes, coronary artery disease status post CABG in 2018 with stage D systolic heart failure status post CCM, hypertension, history of bladder cancer and paroxysmal atrial fibrillation, admitted 11/26 with large retroperitoneal hematoma (home Eliquis now held) and shortness of breath.   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 - 08/2022 showed EF 25-30%  - 03/2023 TEE: LVEF 20-25%, mild RV dysfunction - Not on SGLT2i due to yeast infections  - Not on Bi-Dil due to low BP  - from notes not on beta blocker due to bradycardia - hyperkalemia on MRA, also gynecomastia on aldactone  - BNP now higher than on admit, sig volume overload by exam - now on Lasix 40 mg IV bid, no UOP recorded 12/01, need   - wt trending up, 11/26 was 197 >> now 209 lbs - Nephrology seeing  - concern for cardiorenal syndrome - BNP has gone from 2038 >> 3018 this admit - discuss options w/ MD, may benefit from inotropes, COOX, CVP  2. Hypotension - limits meds for CHF - has been started on midodrine 10 mg tid - SBP 110s-120s   2. Retroperitoneal bleed - admitted with bleed, had fall 2 weeks prior to admission - from notes manage conservatively, does not require intervention - Hgb stable at  plateau of 9.8   3. Persistent aflutter - recently underwent DCCV on 11/7  - has been on oral amiodarone - Eliquis has been held due to retroperitoneal hematoma  - Maintaining sinus brady  4. CAD  - history of prior CABG in 2018, 2 vessel - no ischemic sx   5. AKI on CKD - Baseline creatinine around 2.5  - followed by nephrology - from renal note chronic disease likely significant component of cardiorenal, perhaps acute injury in setting of hypotension   6. CAP - per primary team   7. Right leg edema - venous US negative this admission for DVT  For questions or updates, please contact [facility] Please consult [email or web address] for contact info under        Signed, Theodore Demark, PA-C  04/01/2023, 10:51 AM

## 2023-04-01 NOTE — TOC Progression Note (Signed)
Transition of Care [facility] - Progression Note    Patient Details  Name: [name] [name] MRN: [id number] Date of Birth: [birth year]  Transition of Care [facility]) CM/SW Contact  Mearl Latin, LCSW Phone Number: 04/01/2023, 2:23 PM  Clinical Narrative:    Patient transferred to 5W. TOC following for discharge needs. CIR following.    Expected Discharge Plan: CIR Barriers to Discharge: Continued Medical Work up  Expected Discharge Plan and Services   Discharge Planning Services: CM Consult Post Acute Care Choice: Home Health Living arrangements for the past 2 months: Single Family Home                 DME Arranged: N/A DME Agency: NA       HH Arranged: PT HH AgencyHotel manager Home Health Care Date [facility] Agency Contacted: 03/26/23 Time HH Agency Contacted: 1026 Representative spoke with at [facility] Agency: Kandee Keen, patient active for HHPT need resumption of care orders   Social Determinants of Health (SDOH) Interventions SDOH Screenings   Food Insecurity: No Food Insecurity (03/26/2023)  Housing: Low Risk  (03/26/2023)  Transportation Needs: No Transportation Needs (03/26/2023)  Recent Concern: Transportation Needs - Unmet Transportation Needs (03/11/2023)   Received from [facility]  Utilities: Not At Risk (03/26/2023)  Physical Activity: Inactive (07/11/2017)  Stress: No Stress Concern Present (07/11/2017)  Tobacco Use: Medium Risk (03/26/2023)    Readmission Risk Interventions     No data to display

## 2023-04-01 NOTE — Care Management Important Message (Signed)
Important Message  Patient Details  Name: RINALDO SCHARPF MRN: [id number] Date of Birth: [birth year]   Important Message Given:  Yes - Medicare IM     Sherilyn Banker 04/01/2023, 3:18 PM

## 2023-04-01 NOTE — Progress Notes (Signed)
Inpatient Rehab Admissions Coordinator:  Saw pt at bedside. Informed him that awaiting medical clearance. Will continue to follow.   Wolfgang Phoenix, MS, CCC-SLP Admissions Coordinator [phone]

## 2023-04-01 NOTE — Progress Notes (Signed)
Patient ID: [name] [name], male   DOB: [birth year], 77 y.o.   MRN: [id number] [facility] Progress Note   Assessment/ Plan:   1. Acute kidney Injury on chronic kidney disease stage IIIb (baseline creatinine around 2): Underlying chronic kidney disease suspected to be cardiorenal with acute injury likely from ischemic ATN in the setting of hypotension with ongoing ARB use (Entresto).  There is no urine output charted however according to the patient, he appears to be nonoliguric.  Renal function slightly worse overnight and likely indicative of approach to the plateau phase of ATN.  No indications noted at this time for hemodialysis.  No evidence of obstruction associated with retroperitoneal hematoma.  Continue supportive management/lab monitoring. 2.  Fall/right retroperitoneal hematoma: Developed traumatic multilocular acute versus subacute retroperitoneal hematoma with intramuscular hemorrhage within the right iliacus muscle.  Apixaban discontinued and ongoing symptomatic management. 3.  Acute exacerbation of chronic systolic heart failure: Torsemide and Entresto originally held in the setting of acute kidney injury and getting intermittent intravenous furosemide doses for hypervolemia. 4.  Anemia: Mild and secondary to underlying chronic kidney disease/chronic illness with exacerbation due to retroperitoneal hemorrhage.  No indication for PRBC transfusion. 5.  Atrial fibrillation: Currently in sinus rhythm that is rate controlled while on amiodarone.  Subjective:   Reports to be feeling fair and denies any chest pain or shortness of breath.   Objective:   BP (!) 123/55 (BP Location: Right Arm)   Pulse (!) 52   Temp 98.5 F (36.9 C) (Oral)   Resp 18   Ht 6\' 2"  (1.88 m)   Wt 94.9 kg   SpO2 95%   BMI 26.86 kg/m   Intake/Output Summary (Last 24 hours) at 04/01/2023 1047 Last data filed at 04/01/2023 4696 Gross per 24 hour  Intake 240 ml  Output --  Net 240 ml   Weight  change: 0.5 kg  Physical Exam: Gen: Comfortably resting in bed, awake/alert CVS: Pulse regular rhythm, normal rate, S1 and S2 normal Resp: Diminished breath sounds over bases due to poor inspiratory effort, no rales/rhonchi Abd: Soft, flat, nontender, bowel sounds normal Ext: 3+ right lower extremity edema, 1+ left lower extremity edema  Imaging: IR Fluoro Guide CV Line Right  Result Date: 04/01/2023 INDICATION: Heart failure patient, Milrinone therapy access EXAM: TUNNELED PICC LINE WITH ULTRASOUND AND FLUOROSCOPIC GUIDANCE MEDICATIONS: 1% lidocaine local. ANESTHESIA/SEDATION: None. FLUOROSCOPY: Radiation Exposure Index (as provided by the fluoroscopic device): 9.0 mGy Kerma COMPLICATIONS: None immediate. PROCEDURE: Informed written consent was obtained from the patient after a discussion of the risks, benefits, and alternatives to treatment. Questions regarding the procedure were encouraged and answered. The right neck and chest were prepped with chlorhexidine in a sterile fashion, and a sterile drape was applied covering the operative field. Maximum barrier sterile technique with sterile gowns and gloves were used for the procedure. A timeout was performed prior to the initiation of the procedure. After creating a small venotomy incision, a micropuncture kit was utilized to access the right internal jugular vein under direct, real-time ultrasound guidance after the overlying soft tissues were anesthetized with 1% lidocaine with epinephrine. Ultrasound image documentation was performed. The microwire was kinked to measure appropriate catheter length. The micropuncture sheath was exchanged for a peel-away sheath over a guidewire. A 5 French dual lumen tunneled PICC measuring 28 cm was tunneled in a retrograde fashion from the anterior chest wall to the venotomy incision. The catheter was then placed through the peel-away sheath with tip ultimately positioned  at the superior caval-atrial junction. Final  catheter positioning was confirmed and documented with a spot radiographic image. The catheter aspirates and flushes normally. The catheter was flushed with appropriate volume heparin dwells. The catheter exit site was secured with a 0-Prolene retention suture. The venotomy incision was closed with an interrupted 4-0 Vicryl, Dermabond and Steri-strips. Dressings were applied. The patient tolerated the procedure well without immediate post procedural complication. FINDINGS: After catheter placement, the tip lies within the superior cavoatrial junction. The catheter aspirates and flushes normally and is ready for immediate use. IMPRESSION: Successful placement of 28cm dual lumen tunneled PICC catheter via the right internal jugular vein with tip terminating at the superior caval atrial junction. The catheter is ready for immediate use. Electronically Signed   By: Judie Petit.  Shick M.D.   On: 04/01/2023 10:07   IR US Guide Vasc Access Right  Result Date: 04/01/2023 INDICATION: Heart failure patient, Milrinone therapy access EXAM: TUNNELED PICC LINE WITH ULTRASOUND AND FLUOROSCOPIC GUIDANCE MEDICATIONS: 1% lidocaine local. ANESTHESIA/SEDATION: None. FLUOROSCOPY: Radiation Exposure Index (as provided by the fluoroscopic device): 9.0 mGy Kerma COMPLICATIONS: None immediate. PROCEDURE: Informed written consent was obtained from the patient after a discussion of the risks, benefits, and alternatives to treatment. Questions regarding the procedure were encouraged and answered. The right neck and chest were prepped with chlorhexidine in a sterile fashion, and a sterile drape was applied covering the operative field. Maximum barrier sterile technique with sterile gowns and gloves were used for the procedure. A timeout was performed prior to the initiation of the procedure. After creating a small venotomy incision, a micropuncture kit was utilized to access the right internal jugular vein under direct, real-time ultrasound guidance  after the overlying soft tissues were anesthetized with 1% lidocaine with epinephrine. Ultrasound image documentation was performed. The microwire was kinked to measure appropriate catheter length. The micropuncture sheath was exchanged for a peel-away sheath over a guidewire. A 5 French dual lumen tunneled PICC measuring 28 cm was tunneled in a retrograde fashion from the anterior chest wall to the venotomy incision. The catheter was then placed through the peel-away sheath with tip ultimately positioned at the superior caval-atrial junction. Final catheter positioning was confirmed and documented with a spot radiographic image. The catheter aspirates and flushes normally. The catheter was flushed with appropriate volume heparin dwells. The catheter exit site was secured with a 0-Prolene retention suture. The venotomy incision was closed with an interrupted 4-0 Vicryl, Dermabond and Steri-strips. Dressings were applied. The patient tolerated the procedure well without immediate post procedural complication. FINDINGS: After catheter placement, the tip lies within the superior cavoatrial junction. The catheter aspirates and flushes normally and is ready for immediate use. IMPRESSION: Successful placement of 28cm dual lumen tunneled PICC catheter via the right internal jugular vein with tip terminating at the superior caval atrial junction. The catheter is ready for immediate use. Electronically Signed   By: Judie Petit.  Shick M.D.   On: 04/01/2023 10:07    Labs: BMET Recent Labs  Lab 03/25/23 2025 03/27/23 0636 03/28/23 0548 03/29/23 0523 03/30/23 0233 03/31/23 0823  NA 133* 134* 131* 131* 132* 130*  K 3.7 4.3 4.1 4.6 4.9 4.0  CL 105 105 101 103 104 101  CO2 21* 20* 18* 19* 20* 18*  GLUCOSE 176* 101* 119* 118* 119* 116*  BUN 32* 37* 48* 55* 63* 67*  CREATININE 1.96* 2.25* 3.13* 3.26* 3.29* 3.43*  CALCIUM 8.1* 8.1* 8.1* 8.2* 8.2* 8.3*   CBC Recent Labs  Lab 03/25/23  2025 03/27/23 0636 03/28/23 0548  03/29/23 0523 03/30/23 0233  WBC 14.2* 13.8* 13.7* 11.3* 11.5*  NEUTROABS 12.0*  --   --   --   --   HGB 10.9* 9.7* 9.8* 9.8* 9.8*  HCT 35.4* 29.9* 30.4* 30.8* 31.0*  MCV 85.5 82.8 83.3 83.5 83.1  PLT 555* 475* 463* 489* 587*    Medications:     amiodarone  200 mg Oral Daily   furosemide  40 mg Intravenous BID   guaiFENesin  600 mg Oral BID   insulin aspart  0-5 Units Subcutaneous QHS   insulin aspart  0-6 Units Subcutaneous TID WC   midodrine  10 mg Oral TID WC   polyethylene glycol  17 g Oral Daily   senna-docusate  1 tablet Oral BID   sodium bicarbonate  650 mg Oral BID   sodium chloride flush  3 mL Intravenous Q12H   Zetta Bills, MD 04/01/2023, 10:47 AM

## 2023-04-01 NOTE — Progress Notes (Signed)
Heart Failure Navigator Progress Note  Assessed for Heart & Vascular TOC clinic readiness.  Patient does not meet criteria due to Advanced Heart Failure Team patient of Dr. Bensimhon.   Navigator will sign off at this time.   Lucilia Yanni, BSN, RN Heart Failure Nurse Navigator Secure Chat Only   

## 2023-04-01 NOTE — Procedures (Signed)
Interventional Radiology Procedure Note  Procedure: rt internal jugular tunneled dl power picc    Complications: None  Estimated Blood Loss:  min  Findings: Tip svcra    Sharen Counter, MD

## 2023-04-01 NOTE — Progress Notes (Addendum)
PROGRESS NOTE  [name] [name]  ZOX:[id number] DOB: January 14, 1946 DOA: 03/25/2023 PCP: [name], MD   Brief Narrative: Patient is a 77 year old male with history of hypertension, coronary artery disease, paroxysmal A-fib/flutter, chronic systolic CHF, status post pacemaker placement, peripheral vascular disease, diabetes type 2, bladder cancer, CKD stage IIIb who presented from home with complaint of hip pain after the fall.  He was also admitted on 03/05/2023 and was discharged on 11/24 after he presented with fall at home, found to have right distal clavicle fracture that was managed nonoperatively.  Patient reported worsening progressive right hip pain for last 2 days, unable to ambulate.  On condition he was hemodynamically stable.  Lab work showed WBC count of 14.2, creatinine of 1.9, hemoglobin of 10.9.  CT hip showed evidence of right retroperitoneal hematoma extending into the distal right psoas and right iliacus muscle.  CT chest also showed multifocal groundglass opacities bilaterally.  Started on antibiotics for possible PNA.  PT/OT consulted, recommendation is acute inpatient rehab. Cardiology following for acute on chronic systolic CHF.  Nephrology also following  Assessment & Plan:  Principal Problem:   Retroperitoneal hematoma  Fall/retroperitoneal hematoma:CT right hip showed Retroperitoneal hematoma extending into the distal right psoas and right iliacus muscle. CT imaging also showed multilocular acute to subacute retroperitoneal hematoma with a component noted superiorly along the posterolateral abdominal wall measuring 4.4 x 2.6 cm. Intramuscular hemorrhage within the right iliacus muscle with the dominant collection measuring 4.5 x 8.1 cm.  Continue pain management, supportive care.  This is most likely secondary to fall.  PT/OT consulted, AIR recommended.  Patient lives with his wife, ambulates with help of walker but has not been able to use it recently. Case was discussed  with general surgery by ED physician, no surgical intervention recommended.  AKI on CKD stage IIIb: Baseline creatinine around 2.5.  Kidney function trended up to 3 . Avoid nephrotoxins.  Most likely from cardiorenal syndrome.  He follows with [facility], nephrology also consulted, given need of persistent diuresis with IV medications and worsening kidney function. Currently on IV Lasix.  BMP pending for today  Acute on Chronic systolic congestive heart failure: Elevated BNP.  Status post AICD.  Home torsemide,Entresto on hold.  Patient looks volume overloaded, cardiology following. Last echo done on 11/7 showed EF of 20 to 25%.  Elevated BNP. Currently on IV Lasix.  PICC line placed for possible need of inotropes because of persistent congestive heart failure with volume overload  Community acquired pneumonia: CT imaging showed multilevel groundglass infiltrates bilaterally.He  completed course of antibiotics.  Currently on 1L oxygen per minute.  He is not on oxygen at home.  Will wean the oxygen.  Right lower extremity swelling: Venous Doppler did not show any DVT  Elevated liver enzymes: Mild, continue to monitor  Leukocytosis: This most likely reactive.  Continue to monitor  Atrial flutter/fibrillation: Monitor on telemetry.  Eliquis on hold due to finding of retroperitoneal hematoma.  Continue monitor. Likely hold Eliquis for a week.  Follows with cardiology as an outpatient.  Currently in normal sinus rhythm  Hypertension: Currently blood pressure soft.  Hold torsemide, Entresto   Coronary artery disease: Lipitor on hold due to elevated enzymes.  No anginal symptoms.  Type 2 diabetes: Monitor blood sugars, continue sliding scale  Rash: Developed itch maculopapular rash on his back and buttocks.Ordered quick prednisone taper and hydrocortisone cream.  Lung nodule: CT imaging showed 16 mm nodule within the basilar right middle lobe, indeterminate.  Repeat CT in  3 months or PET -CT      DVT prophylaxis:Place and maintain sequential compression device Start: 03/26/23 1608     Code Status: Full Code  Family Communication: Wife at bedside on 11/30  Patient status:Inpatient  Patient is from :Home  Anticipated discharge to:AIR  Estimated DC date:after adequate volume management, improvement in the kidney function   Consultants: Cardiology, nephrology  Procedures:None  Antimicrobials:  Anti-infectives (From admission, onward)    Start     Dose/Rate Route Frequency Ordered Stop   03/27/23 0600  cefTRIAXone (ROCEPHIN) 1 g in sodium chloride 0.9 % 100 mL IVPB        1 g 200 mL/hr over 30 Minutes Intravenous Every 24 hours 03/26/23 0821 03/30/23 0549   03/27/23 0600  azithromycin (ZITHROMAX) 500 mg in sodium chloride 0.9 % 250 mL IVPB  Status:  Discontinued        500 mg 250 mL/hr over 60 Minutes Intravenous Every 24 hours 03/26/23 0821 03/28/23 0934   03/26/23 0615  cefTRIAXone (ROCEPHIN) 1 g in sodium chloride 0.9 % 100 mL IVPB        1 g 200 mL/hr over 30 Minutes Intravenous  Once 03/26/23 0600 03/26/23 0744   03/26/23 0615  azithromycin (ZITHROMAX) tablet 500 mg        500 mg Oral  Once 03/26/23 0600 03/26/23 0612       Subjective: Patient seen and examined at bedside today.  Hemodynamically stable.  Lying on  bed.  Appears weak.  He just came from PICC line placement.  Appears sleepy/drowsy.  Denies any worsening shortness of breath or cough.  objective: Vitals:   03/31/23 1824 03/31/23 2200 04/01/23 0500 04/01/23 0751  BP: (!) 124/55 (!) 117/51 (!) 112/50 (!) 123/55  Pulse: (!) 52 (!) 52 (!) 52   Resp: 19 18 18 18   Temp:  98.2 F (36.8 C) 98.2 F (36.8 C) 98.5 F (36.9 C)  TempSrc:  Oral Oral Oral  SpO2: 95% 92% 93% 95%  Weight:   94.9 kg   Height:        Intake/Output Summary (Last 24 hours) at 04/01/2023 1046 Last data filed at 04/01/2023 0755 Gross per 24 hour  Intake 240 ml  Output --  Net 240 ml   Filed Weights   03/26/23 1847  03/31/23 0500 04/01/23 0500  Weight: 89.5 kg 94.4 kg 94.9 kg    Examination:   General exam: Overall comfortable, not in distress,weak and deconditioned HEENT: PERRL Respiratory system: Diminished Bilaterally, Bilateral Crackles Cardiovascular system: S1 & S2 heard, RRR.  PICC line on the right chest Gastrointestinal system: Abdomen is nondistended, soft and nontender. Central nervous system: Alert and oriented Extremities: Right lower extremity edema, no clubbing ,no cyanosis Skin: No rashes, no ulcers,no icterus     Data Reviewed: I have personally reviewed following labs and imaging studies  CBC: Recent Labs  Lab 03/25/23 2025 03/27/23 0636 03/28/23 0548 03/29/23 0523 03/30/23 0233  WBC 14.2* 13.8* 13.7* 11.3* 11.5*  NEUTROABS 12.0*  --   --   --   --   HGB 10.9* 9.7* 9.8* 9.8* 9.8*  HCT 35.4* 29.9* 30.4* 30.8* 31.0*  MCV 85.5 82.8 83.3 83.5 83.1  PLT 555* 475* 463* 489* 587*   Basic Metabolic Panel: Recent Labs  Lab 03/25/23 2025 03/27/23 0636 03/28/23 0548 03/29/23 0523 03/30/23 0233 03/31/23 0823  NA 133* 134* 131* 131* 132* 130*  K 3.7 4.3 4.1 4.6 4.9 4.0  CL 105  105 101 103 104 101  CO2 21* 20* 18* 19* 20* 18*  GLUCOSE 176* 101* 119* 118* 119* 116*  BUN 32* 37* 48* 55* 63* 67*  CREATININE 1.96* 2.25* 3.13* 3.26* 3.29* 3.43*  CALCIUM 8.1* 8.1* 8.1* 8.2* 8.2* 8.3*  MG 2.1  --   --   --   --   --      Recent Results (from the past 240 hour(s))  Respiratory (~20 pathogens) panel by PCR     Status: None   Collection Time: 03/26/23  9:47 AM   Specimen: Nasopharyngeal Swab; Respiratory  Result Value Ref Range Status   Adenovirus NOT DETECTED NOT DETECTED Final   Coronavirus 229E NOT DETECTED NOT DETECTED Final    Comment: (NOTE) The Coronavirus on the Respiratory Panel, DOES NOT test for the novel  Coronavirus (2019 nCoV)    Coronavirus HKU1 NOT DETECTED NOT DETECTED Final   Coronavirus NL63 NOT DETECTED NOT DETECTED Final   Coronavirus OC43 NOT  DETECTED NOT DETECTED Final   Metapneumovirus NOT DETECTED NOT DETECTED Final   Rhinovirus / Enterovirus NOT DETECTED NOT DETECTED Final   Influenza A NOT DETECTED NOT DETECTED Final   Influenza B NOT DETECTED NOT DETECTED Final   Parainfluenza Virus 1 NOT DETECTED NOT DETECTED Final   Parainfluenza Virus 2 NOT DETECTED NOT DETECTED Final   Parainfluenza Virus 3 NOT DETECTED NOT DETECTED Final   Parainfluenza Virus 4 NOT DETECTED NOT DETECTED Final   Respiratory Syncytial Virus NOT DETECTED NOT DETECTED Final   Bordetella pertussis NOT DETECTED NOT DETECTED Final   Bordetella Parapertussis NOT DETECTED NOT DETECTED Final   Chlamydophila pneumoniae NOT DETECTED NOT DETECTED Final   Mycoplasma pneumoniae NOT DETECTED NOT DETECTED Final    Comment: Performed at [facility], [address], [place], [place] [address]  SARS Coronavirus 2 by RT PCR (hospital order, performed in [facility] hospital lab) *cepheid single result test* Anterior Nasal Swab     Status: None   Collection Time: 03/26/23  9:47 AM   Specimen: Anterior Nasal Swab  Result Value Ref Range Status   SARS Coronavirus 2 by RT PCR NEGATIVE NEGATIVE Final    Comment: Performed at [facility], [address], [place], [place] [address]     Radiology Studies: IR Fluoro Guide CV Line Right  Result Date: 04/01/2023 INDICATION: Heart failure patient, Milrinone therapy access EXAM: TUNNELED PICC LINE WITH ULTRASOUND AND FLUOROSCOPIC GUIDANCE MEDICATIONS: 1% lidocaine local. ANESTHESIA/SEDATION: None. FLUOROSCOPY: Radiation Exposure Index (as provided by the fluoroscopic device): 9.0 mGy Kerma COMPLICATIONS: None immediate. PROCEDURE: Informed written consent was obtained from the patient after a discussion of the risks, benefits, and alternatives to treatment. Questions regarding the procedure were encouraged and answered. The right neck and chest were prepped with chlorhexidine in a sterile fashion, and a sterile drape was  applied covering the operative field. Maximum barrier sterile technique with sterile gowns and gloves were used for the procedure. A timeout was performed prior to the initiation of the procedure. After creating a small venotomy incision, a micropuncture kit was utilized to access the right internal jugular vein under direct, real-time ultrasound guidance after the overlying soft tissues were anesthetized with 1% lidocaine with epinephrine. Ultrasound image documentation was performed. The microwire was kinked to measure appropriate catheter length. The micropuncture sheath was exchanged for a peel-away sheath over a guidewire. A 5 French dual lumen tunneled PICC measuring 28 cm was tunneled in a retrograde fashion from the anterior  chest wall to the venotomy incision. The catheter was then placed through the peel-away sheath with tip ultimately positioned at the superior caval-atrial junction. Final catheter positioning was confirmed and documented with a spot radiographic image. The catheter aspirates and flushes normally. The catheter was flushed with appropriate volume heparin dwells. The catheter exit site was secured with a 0-Prolene retention suture. The venotomy incision was closed with an interrupted 4-0 Vicryl, Dermabond and Steri-strips. Dressings were applied. The patient tolerated the procedure well without immediate post procedural complication. FINDINGS: After catheter placement, the tip lies within the superior cavoatrial junction. The catheter aspirates and flushes normally and is ready for immediate use. IMPRESSION: Successful placement of 28cm dual lumen tunneled PICC catheter via the right internal jugular vein with tip terminating at the superior caval atrial junction. The catheter is ready for immediate use. Electronically Signed   By: Judie Petit.  Shick M.D.   On: 04/01/2023 10:07   IR US Guide Vasc Access Right  Result Date: 04/01/2023 INDICATION: Heart failure patient, Milrinone therapy access  EXAM: TUNNELED PICC LINE WITH ULTRASOUND AND FLUOROSCOPIC GUIDANCE MEDICATIONS: 1% lidocaine local. ANESTHESIA/SEDATION: None. FLUOROSCOPY: Radiation Exposure Index (as provided by the fluoroscopic device): 9.0 mGy Kerma COMPLICATIONS: None immediate. PROCEDURE: Informed written consent was obtained from the patient after a discussion of the risks, benefits, and alternatives to treatment. Questions regarding the procedure were encouraged and answered. The right neck and chest were prepped with chlorhexidine in a sterile fashion, and a sterile drape was applied covering the operative field. Maximum barrier sterile technique with sterile gowns and gloves were used for the procedure. A timeout was performed prior to the initiation of the procedure. After creating a small venotomy incision, a micropuncture kit was utilized to access the right internal jugular vein under direct, real-time ultrasound guidance after the overlying soft tissues were anesthetized with 1% lidocaine with epinephrine. Ultrasound image documentation was performed. The microwire was kinked to measure appropriate catheter length. The micropuncture sheath was exchanged for a peel-away sheath over a guidewire. A 5 French dual lumen tunneled PICC measuring 28 cm was tunneled in a retrograde fashion from the anterior chest wall to the venotomy incision. The catheter was then placed through the peel-away sheath with tip ultimately positioned at the superior caval-atrial junction. Final catheter positioning was confirmed and documented with a spot radiographic image. The catheter aspirates and flushes normally. The catheter was flushed with appropriate volume heparin dwells. The catheter exit site was secured with a 0-Prolene retention suture. The venotomy incision was closed with an interrupted 4-0 Vicryl, Dermabond and Steri-strips. Dressings were applied. The patient tolerated the procedure well without immediate post procedural complication.  FINDINGS: After catheter placement, the tip lies within the superior cavoatrial junction. The catheter aspirates and flushes normally and is ready for immediate use. IMPRESSION: Successful placement of 28cm dual lumen tunneled PICC catheter via the right internal jugular vein with tip terminating at the superior caval atrial junction. The catheter is ready for immediate use. Electronically Signed   By: Judie Petit.  Shick M.D.   On: 04/01/2023 10:07    Scheduled Meds:  amiodarone  200 mg Oral Daily   furosemide  40 mg Intravenous BID   guaiFENesin  600 mg Oral BID   insulin aspart  0-5 Units Subcutaneous QHS   insulin aspart  0-6 Units Subcutaneous TID WC   midodrine  10 mg Oral TID WC   polyethylene glycol  17 g Oral Daily   senna-docusate  1 tablet Oral BID  sodium bicarbonate  650 mg Oral BID   sodium chloride flush  3 mL Intravenous Q12H   Continuous Infusions:     LOS: 6 days   Burnadette Pop, MD [facility] P12/05/2022, 10:46 AM

## 2023-04-01 NOTE — Progress Notes (Addendum)
Physical Therapy Treatment Patient Details Name: [name] [name] MRN: [id number] DOB: [birth year] Today's Date: 04/01/2023   History of Present Illness 77 y.o. male presents to [facility] hospital on 03/25/2023 with progressively worsening R hip pain. Pt found to have retroperitoneal hematoma extending into the distal R psoas and R iliacus. Started on antibiotics for possible PNA. Negative [place] for R LE DVT. Pt was recently admitted 11/5-11/8 after a fall and R distal clavicle fx. PMH includes AICD, bladder CA, CHF, CAD, DMII, PAF, PNA. 11/5 right distal clavicle fracture that was managed nonoperatively.    PT Comments  Pt received in supine, agreeable to therapy session with encouragement, pt c/o increased R hip pain with activity and quick to fatigue. Pt performs bed mobility with use of rail and hospital bed features with heavy modA and needing max to totalA for lateral scooting along EOB. Pt too fatigued to stand from EOB with +1 assist and defers to attempt with +2 staff assist when PTA offered to have nursing staff assist due to c/o pain/fatigue. Pt reports no appetite but encouraged to try eating from food tray which arrived at end of session, bed up in chair posture. Spouse present and encouraging, assisting pt to eat. Encouraged pt to use ice PRN to his R hip and notify staff for pain mgmt/needs. Patient will benefit from intensive inpatient follow up therapy, >3 hours/day, will plan to work on transfers to chair or wheelchair next session with +2 assist for safety.     If plan is discharge home, recommend the following: A lot of help with walking and/or transfers;A lot of help with bathing/dressing/bathroom;Assist for transportation;Help with stairs or ramp for entrance   Can travel by private vehicle        Equipment Recommendations  None recommended by PT (will continue to assess)    Recommendations for Other Services       Precautions / Restrictions Precautions Precautions:  Fall Precaution Comments: watch BP and HR Restrictions Weight Bearing Restrictions: No Other Position/Activity Restrictions: RUE WBAT allowed per Dr Renford Dills 12/2 10:47 AM     Mobility  Bed Mobility Overal bed mobility: Needs Assistance Bed Mobility: Supine to Sit, Sit to Sidelying     Supine to sit: Mod assist, HOB elevated, Used rails   Sit to sidelying: Mod assist, Used rails General bed mobility comments: ModA to complete moving LE's off EOB and for trunk elevation. Cues for sequencing. ModA to return to supine with BLE assist, pt able to assist with trunk lowering with cues for bed rail use, log roll on to his L for pt comfort returning to bed due to R hip pain    Transfers Overall transfer level: Needs assistance                 General transfer comment: Pt defers due to fear of falls/fatigue and R hip pain. Strong anterior lean siting EOB and pt at risk of sliding off EOB, attempted lateral scoot toward [facility] but pt needing totalA with bed pad for this.    Ambulation/Gait               General Gait Details: pt not yet able to perform; fatigued   Stairs             Wheelchair Mobility     Tilt Bed    Modified Rankin (Stroke Patients Only)       Balance Overall balance assessment: Needs assistance, History of Falls Sitting-balance support: Feet supported, Bilateral upper  extremity supported Sitting balance-Leahy Scale: Fair Sitting balance - Comments: significant anterior lean, pt hips advanced far to EOB after sitting EOB ~5 mins, had to return to supine to prevent hips sliding off EOB. Postural control:  (anterior lean)     Standing balance comment: defer for pt safety (pt also refusing to attempt), pain/fatigue limiting after EOB posture                            Cognition Arousal: Alert Behavior During Therapy: Anxious, Flat affect Overall Cognitive Status: Within Functional Limits for tasks assessed Area of Impairment:  Safety/judgement, Problem solving                         Safety/Judgement: Decreased awareness of safety   Problem Solving: Decreased initiation, Difficulty sequencing, Requires verbal cues General Comments: Pt appears fearful of falls, reluctant to attempt standing, appears more anxious with EOB posture.        Exercises Other Exercises Other Exercises: Supine BLE AROM: ankle pumps, SAQ, quad sets (performs ~5 reps ea with teachback) Other Exercises: static sitting emphasis on upright posture/core activation, pursed-lip breathing with cues for technique ~5 reps, instruction on IS frequency/technique    General Comments General comments (skin integrity, edema, etc.): BP taken seated EOB and supine and WFL. SpO2 reading 89-91% on RA; HR WFL. RN notified of pt VS/tolerance      Pertinent Vitals/Pain Pain Assessment Pain Assessment: Faces Faces Pain Scale: Hurts even more Pain Location: R hip Pain Descriptors / Indicators: Tender, Discomfort, Grimacing, Guarding Pain Intervention(s): Monitored during session, Limited activity within patient's tolerance, Repositioned, Ice applied    Home Living                          Prior Function            PT Goals (current goals can now be found in the care plan section) Acute Rehab PT Goals Patient Stated Goal: to get better PT Goal Formulation: With patient Time For Goal Achievement: 04/10/23 Progress towards PT goals: Progressing toward goals    Frequency    Min 1X/week      PT Plan      Co-evaluation              AM-PAC PT "6 Clicks" Mobility   Outcome Measure  Help needed turning from your back to your side while in a flat bed without using bedrails?: A Little Help needed moving from lying on your back to sitting on the side of a flat bed without using bedrails?: A Lot Help needed moving to and from a bed to a chair (including a wheelchair)?: A Lot Help needed standing up from a chair using  your arms (e.g., wheelchair or bedside chair)?: Total Help needed to walk in hospital room?: Total Help needed climbing 3-5 steps with a railing? : Total 6 Click Score: 10    End of Session Equipment Utilized During Treatment: Gait belt (pt received on RA) Activity Tolerance: Patient limited by fatigue;Patient limited by pain Patient left: with family/visitor present;with call bell/phone within reach;in bed;with bed alarm set;Other (comment) (bed in chair posture so pt can eat dinner) Nurse Communication: Mobility status;Need for lift equipment;Weight bearing status;Other (comment) (+2 for OOB tf) PT Visit Diagnosis: Unsteadiness on feet (R26.81);Repeated falls (R29.6);Other abnormalities of gait and mobility (R26.89)     Time: 1610-9604 PT Time  Calculation (min) (ACUTE ONLY): 25 min  Charges:    $Therapeutic Exercise: 8-22 mins $Therapeutic Activity: 8-22 mins PT General Charges $$ ACUTE PT VISIT: 1 Visit                     Tiffini Blacksher P., PTA Acute Rehabilitation Services Secure Chat Preferred 9a-5:30pm Office: [phone]    [place]us [place]ms 04/01/2023, 5:22 PM

## 2023-04-02 ENCOUNTER — Inpatient Hospital Stay (HOSPITAL_COMMUNITY): Payer: Medicare Other

## 2023-04-02 DIAGNOSIS — N1832 Chronic kidney disease, stage 3b: Secondary | ICD-10-CM | POA: Diagnosis not present

## 2023-04-02 DIAGNOSIS — N179 Acute kidney failure, unspecified: Secondary | ICD-10-CM | POA: Diagnosis not present

## 2023-04-02 DIAGNOSIS — I5043 Acute on chronic combined systolic (congestive) and diastolic (congestive) heart failure: Secondary | ICD-10-CM | POA: Diagnosis not present

## 2023-04-02 DIAGNOSIS — I509 Heart failure, unspecified: Secondary | ICD-10-CM | POA: Diagnosis not present

## 2023-04-02 DIAGNOSIS — K683 Retroperitoneal hematoma: Secondary | ICD-10-CM | POA: Diagnosis not present

## 2023-04-02 LAB — URINALYSIS, ROUTINE W REFLEX MICROSCOPIC
Bilirubin Urine: NEGATIVE
Glucose, UA: NEGATIVE mg/dL
Hgb urine dipstick: NEGATIVE
Ketones, ur: NEGATIVE mg/dL
Leukocytes,Ua: NEGATIVE
Nitrite: NEGATIVE
Protein, ur: NEGATIVE mg/dL
Specific Gravity, Urine: 1.011 (ref 1.005–1.030)
pH: 5 (ref 5.0–8.0)

## 2023-04-02 LAB — CUP PACEART REMOTE DEVICE CHECK
Battery Remaining Longevity: 60 mo
Battery Voltage: 2.99 V
Brady Statistic RV Percent Paced: 0 %
Date Time Interrogation Session: 20241105011057
HighPow Impedance: 68 Ohm
Implantable Lead Connection Status: 753985
Implantable Lead Implant Date: 20190724
Implantable Lead Location: 753860
Implantable Pulse Generator Implant Date: 20190724
Lead Channel Impedance Value: 323 Ohm
Lead Channel Impedance Value: 342 Ohm
Lead Channel Pacing Threshold Amplitude: 0.75 V
Lead Channel Pacing Threshold Pulse Width: 0.4 ms
Lead Channel Sensing Intrinsic Amplitude: 9.75 mV
Lead Channel Sensing Intrinsic Amplitude: 9.75 mV
Lead Channel Setting Pacing Amplitude: 2.5 V
Lead Channel Setting Pacing Pulse Width: 0.4 ms
Lead Channel Setting Sensing Sensitivity: 0.3 mV
Zone Setting Status: 755011

## 2023-04-02 LAB — BASIC METABOLIC PANEL
Anion gap: 9 (ref 5–15)
BUN: 65 mg/dL — ABNORMAL HIGH (ref 8–23)
CO2: 20 mmol/L — ABNORMAL LOW (ref 22–32)
Calcium: 8.1 mg/dL — ABNORMAL LOW (ref 8.9–10.3)
Chloride: 102 mmol/L (ref 98–111)
Creatinine, Ser: 2.58 mg/dL — ABNORMAL HIGH (ref 0.61–1.24)
GFR, Estimated: 25 mL/min — ABNORMAL LOW (ref >60)
Glucose, Bld: 196 mg/dL — ABNORMAL HIGH (ref 70–99)
Potassium: 4.1 mmol/L (ref 3.5–5.1)
Sodium: 131 mmol/L — ABNORMAL LOW (ref 135–145)

## 2023-04-02 LAB — CBC
HCT: 32.3 % — ABNORMAL LOW (ref 39.0–52.0)
Hemoglobin: 10.5 g/dL — ABNORMAL LOW (ref 13.0–17.0)
MCH: 26.5 pg (ref 26.0–34.0)
MCHC: 32.5 g/dL (ref 30.0–36.0)
MCV: 81.6 fL (ref 80.0–100.0)
Platelets: 786 10*3/uL — ABNORMAL HIGH (ref 150–400)
RBC: 3.96 MIL/uL — ABNORMAL LOW (ref 4.22–5.81)
RDW: 17.2 % — ABNORMAL HIGH (ref 11.5–15.5)
WBC: 21.7 10*3/uL — ABNORMAL HIGH (ref 4.0–10.5)
nRBC: 0 % (ref 0.0–0.2)

## 2023-04-02 LAB — GLUCOSE, CAPILLARY
Glucose-Capillary: 193 mg/dL — ABNORMAL HIGH (ref 70–99)
Glucose-Capillary: 170 mg/dL — ABNORMAL HIGH (ref 70–99)
Glucose-Capillary: 264 mg/dL — ABNORMAL HIGH (ref 70–99)
Glucose-Capillary: 214 mg/dL — ABNORMAL HIGH (ref 70–99)

## 2023-04-02 LAB — COOXEMETRY PANEL
Carboxyhemoglobin: 1.5 % (ref 0.5–1.5)
Methemoglobin: <0.7 % (ref 0.0–1.5)
O2 Saturation: 71.5 %
Total hemoglobin: 9.8 g/dL — ABNORMAL LOW (ref 12.0–16.0)

## 2023-04-02 LAB — MAGNESIUM: Magnesium: 2.1 mg/dL (ref 1.7–2.4)

## 2023-04-02 NOTE — Plan of Care (Signed)
  Problem: Education: Goal: Knowledge of General Education information will improve Description: Including pain rating scale, medication(s)/side effects and non-pharmacologic comfort measures Outcome: Progressing   Problem: Health Behavior/Discharge Planning: Goal: Ability to manage health-related needs will improve Outcome: Progressing   Problem: Clinical Measurements: Goal: Ability to maintain clinical measurements within normal limits will improve Outcome: Progressing Goal: Will remain free from infection Outcome: Progressing Goal: Diagnostic test results will improve Outcome: Progressing Goal: Respiratory complications will improve Outcome: Progressing Goal: Cardiovascular complication will be avoided Outcome: Progressing   Problem: Activity: Goal: Risk for activity intolerance will decrease Outcome: Progressing   Problem: Nutrition: Goal: Adequate nutrition will be maintained Outcome: Progressing   Problem: Coping: Goal: Level of anxiety will decrease Outcome: Progressing   Problem: Elimination: Goal: Will not experience complications related to bowel motility Outcome: Progressing Goal: Will not experience complications related to urinary retention Outcome: Progressing   Problem: Pain Management: Goal: General experience of comfort will improve Outcome: Progressing   Problem: Safety: Goal: Ability to remain free from injury will improve Outcome: Progressing   Problem: Skin Integrity: Goal: Risk for impaired skin integrity will decrease Outcome: Progressing   Problem: Activity: Goal: Ability to tolerate increased activity will improve Outcome: Progressing   Problem: Clinical Measurements: Goal: Ability to maintain a body temperature in the normal range will improve Outcome: Progressing   Problem: Respiratory: Goal: Ability to maintain adequate ventilation will improve Outcome: Progressing Goal: Ability to maintain a clear airway will improve Outcome:  Progressing   Problem: Education: Goal: Ability to describe self-care measures that may prevent or decrease complications (Diabetes Survival Skills Education) will improve Outcome: Progressing Goal: Individualized Educational Video(s) Outcome: Progressing   Problem: Coping: Goal: Ability to adjust to condition or change in health will improve Outcome: Progressing   Problem: Fluid Volume: Goal: Ability to maintain a balanced intake and output will improve Outcome: Progressing   Problem: Health Behavior/Discharge Planning: Goal: Ability to identify and utilize available resources and services will improve Outcome: Progressing Goal: Ability to manage health-related needs will improve Outcome: Progressing   Problem: Metabolic: Goal: Ability to maintain appropriate glucose levels will improve Outcome: Progressing   Problem: Nutritional: Goal: Maintenance of adequate nutrition will improve Outcome: Progressing Goal: Progress toward achieving an optimal weight will improve Outcome: Progressing   Problem: Skin Integrity: Goal: Risk for impaired skin integrity will decrease Outcome: Progressing   Problem: Tissue Perfusion: Goal: Adequacy of tissue perfusion will improve Outcome: Progressing

## 2023-04-02 NOTE — Progress Notes (Signed)
Rounding Note    Patient Name: [name] [name] Date of Encounter: 04/02/2023  [facility] Cardiologist: Arvilla Meres, MD   Subjective   Feels a little more energy, leg is more mobile, still cannot sit up w/out assistance Wife says she would empty urinals at times but told nsg staff Pt says urinated frequently last pm  Inpatient Medications    Scheduled Meds:  amiodarone  200 mg Oral Daily   Chlorhexidine Gluconate Cloth  6 each Topical Daily   furosemide  40 mg Intravenous BID   guaiFENesin  600 mg Oral BID   hydrocortisone cream   Topical TID   insulin aspart  0-5 Units Subcutaneous QHS   insulin aspart  0-6 Units Subcutaneous TID WC   midodrine  10 mg Oral TID WC   polyethylene glycol  17 g Oral Daily   [START ON 04/03/2023] predniSONE  10 mg Oral Q breakfast   predniSONE  20 mg Oral Q breakfast   senna-docusate  1 tablet Oral BID   sodium bicarbonate  650 mg Oral BID   sodium chloride flush  10-40 mL Intracatheter Q12H   sodium chloride flush  3 mL Intravenous Q12H   Continuous Infusions:  milrinone 0.25 mcg/kg/min (04/01/23 2134)   PRN Meds: albuterol, benzonatate, HYDROmorphone (DILAUDID) injection, melatonin, ondansetron **OR** ondansetron (ZOFRAN) IV, oxyCODONE, polyethylene glycol, sodium chloride flush, sodium chloride flush   Vital Signs    Vitals:   04/01/23 2323 04/02/23 0429 04/02/23 0600 04/02/23 0749  BP: (!) 124/49 (!) 111/47 (!) 111/97 (!) 118/45  Pulse: (!) 58 (!) 59 64 63  Resp: 12 12 18 20   Temp: 98 F (36.7 C) 97.7 F (36.5 C)  97.6 F (36.4 C)  TempSrc: Oral Oral  Oral  SpO2: 94% 94% 90% 92%  Weight:  93.2 kg    Height:        Intake/Output Summary (Last 24 hours) at 04/02/2023 0808 Last data filed at 04/02/2023 1610 Gross per 24 hour  Intake 130 ml  Output 1100 ml  Net -970 ml       04/02/2023    4:29 AM 04/01/2023    5:00 AM 03/31/2023    5:00 AM  Last 3 Weights  Weight (lbs) 205 lb 7.5 oz 209 lb 3.5 oz 208 lb  1.8 oz  Weight (kg) 93.2 kg 94.9 kg 94.4 kg      Telemetry    SR, sinus brady - Personally Reviewed  ECG   None today - Personally Reviewed  Physical Exam   GEN: No acute distress.   Neck: JVD 9-10 cm Cardiac: RRR, 2/6 murmur, no rubs, or gallops.  Respiratory: diminished to auscultation bilaterally with rales in the bases. GI: Soft, nontender, non-distended  MS: No edema; No deformity. Neuro:  Nonfocal  Psych: Normal affect   Labs    High Sensitivity Troponin:   Recent Labs  Lab 03/04/23 0015 03/04/23 2120  TROPONINIHS 39* 25*     Chemistry Recent Labs  Lab 03/28/23 0548 03/29/23 0523 03/30/23 0233 03/31/23 0823 04/02/23 0214  NA 131*   < > 132* 130* 131*  K 4.1   < > 4.9 4.0 4.1  CL 101   < > 104 101 102  CO2 18*   < > 20* 18* 20*  GLUCOSE 119*   < > 119* 116* 196*  BUN 48*   < > 63* 67* 65*  CREATININE 3.13*   < > 3.29* 3.43* 2.58*  CALCIUM 8.1*   < >  8.2* 8.3* 8.1*  MG  --   --   --   --  2.1  PROT 5.5*  --   --  5.4*  --   ALBUMIN 2.0*  --   --  1.9*  --   AST 92*  --   --  132*  --   ALT 75*  --   --  107*  --   ALKPHOS 99  --   --  113  --   BILITOT 0.9  --   --  1.0  --   GFRNONAA 20*   < > 19* 18* 25*  ANIONGAP 12   < > 8 11 9    < > = values in this interval not displayed.    Lipids No results for input(s): "CHOL", "TRIG", "HDL", "LABVLDL", "LDLCALC", "CHOLHDL" in the last 168 hours.  Hematology Recent Labs  Lab 03/30/23 0233 04/01/23 1230 04/02/23 0214  WBC 11.5* 17.2* 21.7*  RBC 3.73* 3.64* 3.96*  HGB 9.8* 9.9* 10.5*  HCT 31.0* 30.2* 32.3*  MCV 83.1 83.0 81.6  MCH 26.3 27.2 26.5  MCHC 31.6 32.8 32.5  RDW 17.5* 17.2* 17.2*  PLT 587* 742* 786*   Thyroid No results for input(s): "TSH", "FREET4" in the last 168 hours.  BNP Recent Labs  Lab 03/28/23 0548 03/31/23 0823  BNP 2,038.1* 3,018.3*    DDimer No results for input(s): "DDIMER" in the last 168 hours.   Radiology    IR Fluoro Guide CV Line Right  Result Date:  04/01/2023 INDICATION: Heart failure patient, Milrinone therapy access EXAM: TUNNELED PICC LINE WITH ULTRASOUND AND FLUOROSCOPIC GUIDANCE MEDICATIONS: 1% lidocaine local. ANESTHESIA/SEDATION: None. FLUOROSCOPY: Radiation Exposure Index (as provided by the fluoroscopic device): 9.0 mGy Kerma COMPLICATIONS: None immediate. PROCEDURE: Informed written consent was obtained from the patient after a discussion of the risks, benefits, and alternatives to treatment. Questions regarding the procedure were encouraged and answered. The right neck and chest were prepped with chlorhexidine in a sterile fashion, and a sterile drape was applied covering the operative field. Maximum barrier sterile technique with sterile gowns and gloves were used for the procedure. A timeout was performed prior to the initiation of the procedure. After creating a small venotomy incision, a micropuncture kit was utilized to access the right internal jugular vein under direct, real-time ultrasound guidance after the overlying soft tissues were anesthetized with 1% lidocaine with epinephrine. Ultrasound image documentation was performed. The microwire was kinked to measure appropriate catheter length. The micropuncture sheath was exchanged for a peel-away sheath over a guidewire. A 5 French dual lumen tunneled PICC measuring 28 cm was tunneled in a retrograde fashion from the anterior chest wall to the venotomy incision. The catheter was then placed through the peel-away sheath with tip ultimately positioned at the superior caval-atrial junction. Final catheter positioning was confirmed and documented with a spot radiographic image. The catheter aspirates and flushes normally. The catheter was flushed with appropriate volume heparin dwells. The catheter exit site was secured with a 0-Prolene retention suture. The venotomy incision was closed with an interrupted 4-0 Vicryl, Dermabond and Steri-strips. Dressings were applied. The patient tolerated the  procedure well without immediate post procedural complication. FINDINGS: After catheter placement, the tip lies within the superior cavoatrial junction. The catheter aspirates and flushes normally and is ready for immediate use. IMPRESSION: Successful placement of 28cm dual lumen tunneled PICC catheter via the right internal jugular vein with tip terminating at the superior caval atrial junction. The catheter is ready for  immediate use. Electronically Signed   By: Judie Petit.  Shick M.D.   On: 04/01/2023 10:07   IR US Guide Vasc Access Right  Result Date: 04/01/2023 INDICATION: Heart failure patient, Milrinone therapy access EXAM: TUNNELED PICC LINE WITH ULTRASOUND AND FLUOROSCOPIC GUIDANCE MEDICATIONS: 1% lidocaine local. ANESTHESIA/SEDATION: None. FLUOROSCOPY: Radiation Exposure Index (as provided by the fluoroscopic device): 9.0 mGy Kerma COMPLICATIONS: None immediate. PROCEDURE: Informed written consent was obtained from the patient after a discussion of the risks, benefits, and alternatives to treatment. Questions regarding the procedure were encouraged and answered. The right neck and chest were prepped with chlorhexidine in a sterile fashion, and a sterile drape was applied covering the operative field. Maximum barrier sterile technique with sterile gowns and gloves were used for the procedure. A timeout was performed prior to the initiation of the procedure. After creating a small venotomy incision, a micropuncture kit was utilized to access the right internal jugular vein under direct, real-time ultrasound guidance after the overlying soft tissues were anesthetized with 1% lidocaine with epinephrine. Ultrasound image documentation was performed. The microwire was kinked to measure appropriate catheter length. The micropuncture sheath was exchanged for a peel-away sheath over a guidewire. A 5 French dual lumen tunneled PICC measuring 28 cm was tunneled in a retrograde fashion from the anterior chest wall to the  venotomy incision. The catheter was then placed through the peel-away sheath with tip ultimately positioned at the superior caval-atrial junction. Final catheter positioning was confirmed and documented with a spot radiographic image. The catheter aspirates and flushes normally. The catheter was flushed with appropriate volume heparin dwells. The catheter exit site was secured with a 0-Prolene retention suture. The venotomy incision was closed with an interrupted 4-0 Vicryl, Dermabond and Steri-strips. Dressings were applied. The patient tolerated the procedure well without immediate post procedural complication. FINDINGS: After catheter placement, the tip lies within the superior cavoatrial junction. The catheter aspirates and flushes normally and is ready for immediate use. IMPRESSION: Successful placement of 28cm dual lumen tunneled PICC catheter via the right internal jugular vein with tip terminating at the superior caval atrial junction. The catheter is ready for immediate use. Electronically Signed   By: Judie Petit.  Shick M.D.   On: 04/01/2023 10:07    Cardiac Studies   03/26/2023 DVT STUDY:  Summary:  RIGHT:  - There is no evidence of deep vein thrombosis in the lower extremity.   - No cystic structure found in the popliteal fossa.    LEFT:  - No evidence of common femoral vein obstruction.      Patient Profile     77 y.o. male with hx of COPD, type 2 diabetes, coronary artery disease status post CABG in 2018 with stage D systolic heart failure status post CCM, hypertension, history of bladder cancer and paroxysmal atrial fibrillation, admitted 11/26 with large retroperitoneal hematoma (home Eliquis now held) and shortness of breath.   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 - 08/2022 showed EF 25-30%  - 03/2023 TEE: LVEF 20-25%, mild RV dysfunction - Not on SGLT2i due to yeast infections  - Not on Bi-Dil due to low BP  - from notes not on beta blocker due to bradycardia -  hyperkalemia on MRA, also gynecomastia on aldactone  - BNP 12/01 higher than on admit, sig volume overload by exam >> milrinone added - continue Lasix 40 mg IV bid, 900cc UOP overnight, 1100 for the day   - wt 11/26 was 197 >> peak 209 lbs >>  205 lbs - Nephrology seeing  - concern for cardiorenal syndrome >> improved w/ milrinone - BNP has gone from 2038 >> 3018 this admit - discuss options w/ MD, may benefit from inotropes, COOX, CVP  2. Hypotension - limits meds for CHF - has been started on midodrine 10 mg tid - SBP 110s-120s   2. Retroperitoneal bleed - admitted with bleed, had fall 2 weeks prior to admission - from notes manage conservatively, does not require intervention - Hgb stable    3. Persistent aflutter - recently underwent DCCV on 11/7  - has been on oral amiodarone - Eliquis has been held due to retroperitoneal hematoma  - Maintaining sinus brady    4. CAD  - history of prior CABG in 2018, 2 vessel - no ischemic sx   5. AKI on CKD - Baseline creatinine around 2.5  - followed by nephrology - from renal note chronic disease likely significant component of cardiorenal, perhaps acute injury in setting of hypotension - improved on milrinone   6. CAP - per primary team   7. Right leg edema - venous US negative this admission for DVT  For questions or updates, please contact [facility] Please consult [email or web address] for contact info under        Signed, Theodore Demark, PA-C  04/02/2023, 8:08 AM

## 2023-04-02 NOTE — Progress Notes (Signed)
Remote ICD transmission.   

## 2023-04-02 NOTE — Plan of Care (Signed)
  Problem: Education: Goal: Knowledge of General Education information will improve Description: Including pain rating scale, medication(s)/side effects and non-pharmacologic comfort measures Outcome: Progressing   Problem: Health Behavior/Discharge Planning: Goal: Ability to manage health-related needs will improve Outcome: Progressing   Problem: Clinical Measurements: Goal: Ability to maintain clinical measurements within normal limits will improve Outcome: Progressing Goal: Will remain free from infection Outcome: Progressing   Problem: Activity: Goal: Risk for activity intolerance will decrease Outcome: Progressing   Problem: Nutrition: Goal: Adequate nutrition will be maintained Outcome: Progressing   Problem: Pain Management: Goal: General experience of comfort will improve Outcome: Progressing

## 2023-04-02 NOTE — Progress Notes (Signed)
IP rehab admissions - once patient is medically ready, can potentially admit to CIR.  Patient is not ready for CIR today.  Call me for questions.  [phone]

## 2023-04-02 NOTE — Progress Notes (Signed)
PROGRESS NOTE  [name] [name]  HCW:[id number] DOB: [birth year] DOA: 03/25/2023 PCP: [name], MD   Brief Narrative: Patient is a 77 year old male with history of hypertension, coronary artery disease, paroxysmal A-fib/flutter, chronic systolic CHF, status post pacemaker placement, peripheral vascular disease, diabetes type 2, bladder cancer, CKD stage IIIb who presented from home with complaint of hip pain after the fall.  He was also admitted on 03/05/2023 and was discharged on 11/24 after he presented with fall at home, found to have right distal clavicle fracture that was managed nonoperatively.  Patient reported worsening progressive right hip pain for last 2 days, unable to ambulate.  On condition he was hemodynamically stable.  Lab work showed WBC count of 14.2, creatinine of 1.9, hemoglobin of 10.9.  CT hip showed evidence of right retroperitoneal hematoma extending into the distal right psoas and right iliacus muscle.  CT chest also showed multifocal groundglass opacities bilaterally.  Started on antibiotics for possible PNA.  PT/OT consulted, recommendation is acute inpatient rehab. Cardiology following for acute on chronic systolic CHF.  Nephrology also following.  Due to severe systolic CHF and volume overload, started on milrinone on 12/2  Assessment & Plan:  Principal Problem:   Retroperitoneal hematoma Active Problems:   Acute on chronic combined systolic and diastolic heart failure (HCC)   Stage 3b chronic kidney disease (HCC)   Low output heart failure (HCC)   AKI (acute kidney injury) (HCC)  Fall/retroperitoneal hematoma:CT right hip showed Retroperitoneal hematoma extending into the distal right psoas and right iliacus muscle. CT imaging also showed multilocular acute to subacute retroperitoneal hematoma with a component noted superiorly along the posterolateral abdominal wall measuring 4.4 x 2.6 cm. Intramuscular hemorrhage within the right iliacus muscle with the  dominant collection measuring 4.5 x 8.1 cm.  Continue pain management, supportive care.  This is most likely secondary to fall.  PT/OT consulted, AIR recommended.  Patient lives with his wife, ambulates with help of walker but has not been able to use it recently. Case was discussed with general surgery by ED physician, no surgical intervention recommended.  Acute on Chronic systolic congestive heart failure:   Status post AICD.  Home torsemide,Entresto on hold. Last echo done on 11/7 showed EF of 20 to 25%.  Elevated BNP. Hospital course remarkable for persistent volume overload.  Started on IV Lasix and milrinone.  Status post PICC line placement  AKI on CKD stage IIIb: Baseline creatinine around 2.5.  Kidney function trended up to 3 . He follows with [facility], nephrology also consulted, given need of persistent diuresis with IV medications and worsening kidney function. Currently on IV Lasix.  Creatinine trending down  Community acquired pneumonia: CT imaging showed multilevel groundglass infiltrates bilaterally.He  completed course of antibiotics.  Currently on 1L oxygen per minute.  He is not on oxygen at home.  Will wean the oxygen.  Right lower extremity swelling: Venous Doppler did not show any DVT  Elevated liver enzymes: Mild, continue to monitor  Leukocytosis: Unclear etiology.  Afebrile.  Denies any abdominal pain.  Will check UA and chest x-ray.  Blood culture sent.  Could be also from prednisone that was started on 12/2  Atrial flutter/fibrillation: Monitor on telemetry.  Eliquis on hold due to finding of retroperitoneal hematoma.    Follows with cardiology as an outpatient.  Currently in normal sinus rhythm.HR well controlled  Hypertension: Currently blood pressure soft.  Hold torsemide, Entresto   Coronary artery disease: Lipitor on hold due to elevated  enzymes.  No anginal symptoms.  Type 2 diabetes: Monitor blood sugars, continue sliding scale  Rash: Developed itch  maculopapular rash on his chrst, back and buttocks.Ordered quick prednisone taper and hydrocortisone cream.  Lung nodule: CT imaging showed 16 mm nodule within the basilar right middle lobe, indeterminate. Repeat CT in  3 months or PET -CT     DVT prophylaxis:Place and maintain sequential compression device Start: 03/26/23 1608     Code Status: Full Code  Family Communication: Wife at bedside on 11/30  Patient status:Inpatient  Patient is from :Home  Anticipated discharge to:AIR  Estimated DC date:after adequate volume management, improvement in the kidney function   Consultants: Cardiology, nephrology  Procedures:None  Antimicrobials:  Anti-infectives (From admission, onward)    Start     Dose/Rate Route Frequency Ordered Stop   03/27/23 0600  cefTRIAXone (ROCEPHIN) 1 g in sodium chloride 0.9 % 100 mL IVPB        1 g 200 mL/hr over 30 Minutes Intravenous Every 24 hours 03/26/23 0821 03/30/23 0549   03/27/23 0600  azithromycin (ZITHROMAX) 500 mg in sodium chloride 0.9 % 250 mL IVPB  Status:  Discontinued        500 mg 250 mL/hr over 60 Minutes Intravenous Every 24 hours 03/26/23 0821 03/28/23 0934   03/26/23 0615  cefTRIAXone (ROCEPHIN) 1 g in sodium chloride 0.9 % 100 mL IVPB        1 g 200 mL/hr over 30 Minutes Intravenous  Once 03/26/23 0600 03/26/23 0744   03/26/23 0615  azithromycin (ZITHROMAX) tablet 500 mg        500 mg Oral  Once 03/26/23 0600 03/26/23 0612       Subjective: Patient seen and examined at bedside today.  Hemodynamically stable.  Looks comfortable today.  Still appears volume overloaded.  Denies any worsening shortness of breath, cough, dysuria or abdominal pain.  The rashes are not itchy like yesterday but persist  objective: Vitals:   04/01/23 2323 04/02/23 0429 04/02/23 0600 04/02/23 0749  BP: (!) 124/49 (!) 111/47 (!) 111/97 (!) 118/45  Pulse: (!) 58 (!) 59 64 63  Resp: 12 12 18 20   Temp: 98 F (36.7 C) 97.7 F (36.5 C)  97.6 F (36.4  C)  TempSrc: Oral Oral  Oral  SpO2: 94% 94% 90% 92%  Weight:  93.2 kg    Height:        Intake/Output Summary (Last 24 hours) at 04/02/2023 1039 Last data filed at 04/02/2023 4098 Gross per 24 hour  Intake 130 ml  Output 1100 ml  Net -970 ml   Filed Weights   03/31/23 0500 04/01/23 0500 04/02/23 0429  Weight: 94.4 kg 94.9 kg 93.2 kg    Examination:   General exam: Overall comfortable, not in distress, weak and deconditioned HEENT: PERRL Respiratory system: Diminished air sounds bilaterally, fine crackles in bases Cardiovascular system: S1 & S2 heard, RRR.  Gastrointestinal system: Abdomen is nondistended, soft and nontender. Central nervous system: Alert and oriented Extremities: Right lower EXTR pitting edema, no clubbing ,no cyanosis Skin: Scattered macular papular rash on the chest, back and buttocks   Data Reviewed: I have personally reviewed following labs and imaging studies  CBC: Recent Labs  Lab 03/28/23 0548 03/29/23 0523 03/30/23 0233 04/01/23 1230 04/02/23 0214  WBC 13.7* 11.3* 11.5* 17.2* 21.7*  HGB 9.8* 9.8* 9.8* 9.9* 10.5*  HCT 30.4* 30.8* 31.0* 30.2* 32.3*  MCV 83.3 83.5 83.1 83.0 81.6  PLT 463* 489* 587* 742* 786*  Basic Metabolic Panel: Recent Labs  Lab 03/28/23 0548 03/29/23 0523 03/30/23 0233 03/31/23 0823 04/02/23 0214  NA 131* 131* 132* 130* 131*  K 4.1 4.6 4.9 4.0 4.1  CL 101 103 104 101 102  CO2 18* 19* 20* 18* 20*  GLUCOSE 119* 118* 119* 116* 196*  BUN 48* 55* 63* 67* 65*  CREATININE 3.13* 3.26* 3.29* 3.43* 2.58*  CALCIUM 8.1* 8.2* 8.2* 8.3* 8.1*  MG  --   --   --   --  2.1     Recent Results (from the past 240 hour(s))  Respiratory (~20 pathogens) panel by PCR     Status: None   Collection Time: 03/26/23  9:47 AM   Specimen: Nasopharyngeal Swab; Respiratory  Result Value Ref Range Status   Adenovirus NOT DETECTED NOT DETECTED Final   Coronavirus 229E NOT DETECTED NOT DETECTED Final    Comment: (NOTE) The Coronavirus  on the Respiratory Panel, DOES NOT test for the novel  Coronavirus (2019 nCoV)    Coronavirus HKU1 NOT DETECTED NOT DETECTED Final   Coronavirus NL63 NOT DETECTED NOT DETECTED Final   Coronavirus OC43 NOT DETECTED NOT DETECTED Final   Metapneumovirus NOT DETECTED NOT DETECTED Final   Rhinovirus / Enterovirus NOT DETECTED NOT DETECTED Final   Influenza A NOT DETECTED NOT DETECTED Final   Influenza B NOT DETECTED NOT DETECTED Final   Parainfluenza Virus 1 NOT DETECTED NOT DETECTED Final   Parainfluenza Virus 2 NOT DETECTED NOT DETECTED Final   Parainfluenza Virus 3 NOT DETECTED NOT DETECTED Final   Parainfluenza Virus 4 NOT DETECTED NOT DETECTED Final   Respiratory Syncytial Virus NOT DETECTED NOT DETECTED Final   Bordetella pertussis NOT DETECTED NOT DETECTED Final   Bordetella Parapertussis NOT DETECTED NOT DETECTED Final   Chlamydophila pneumoniae NOT DETECTED NOT DETECTED Final   Mycoplasma pneumoniae NOT DETECTED NOT DETECTED Final    Comment: Performed at [facility], [address], [place], [place] [address]  SARS Coronavirus 2 by RT PCR (hospital order, performed in [facility] hospital lab) *cepheid single result test* Anterior Nasal Swab     Status: None   Collection Time: 03/26/23  9:47 AM   Specimen: Anterior Nasal Swab  Result Value Ref Range Status   SARS Coronavirus 2 by RT PCR NEGATIVE NEGATIVE Final    Comment: Performed at [facility], [address], [place], [place] [address]     Radiology Studies: IR Fluoro Guide CV Line Right  Result Date: 04/01/2023 INDICATION: Heart failure patient, Milrinone therapy access EXAM: TUNNELED PICC LINE WITH ULTRASOUND AND FLUOROSCOPIC GUIDANCE MEDICATIONS: 1% lidocaine local. ANESTHESIA/SEDATION: None. FLUOROSCOPY: Radiation Exposure Index (as provided by the fluoroscopic device): 9.0 mGy Kerma COMPLICATIONS: None immediate. PROCEDURE: Informed written consent was obtained from the patient after a discussion of the  risks, benefits, and alternatives to treatment. Questions regarding the procedure were encouraged and answered. The right neck and chest were prepped with chlorhexidine in a sterile fashion, and a sterile drape was applied covering the operative field. Maximum barrier sterile technique with sterile gowns and gloves were used for the procedure. A timeout was performed prior to the initiation of the procedure. After creating a small venotomy incision, a micropuncture kit was utilized to access the right internal jugular vein under direct, real-time ultrasound guidance after the overlying soft tissues were anesthetized with 1% lidocaine with epinephrine. Ultrasound image documentation was performed. The microwire was kinked to measure appropriate catheter length. The micropuncture sheath was exchanged for  a peel-away sheath over a guidewire. A 5 French dual lumen tunneled PICC measuring 28 cm was tunneled in a retrograde fashion from the anterior chest wall to the venotomy incision. The catheter was then placed through the peel-away sheath with tip ultimately positioned at the superior caval-atrial junction. Final catheter positioning was confirmed and documented with a spot radiographic image. The catheter aspirates and flushes normally. The catheter was flushed with appropriate volume heparin dwells. The catheter exit site was secured with a 0-Prolene retention suture. The venotomy incision was closed with an interrupted 4-0 Vicryl, Dermabond and Steri-strips. Dressings were applied. The patient tolerated the procedure well without immediate post procedural complication. FINDINGS: After catheter placement, the tip lies within the superior cavoatrial junction. The catheter aspirates and flushes normally and is ready for immediate use. IMPRESSION: Successful placement of 28cm dual lumen tunneled PICC catheter via the right internal jugular vein with tip terminating at the superior caval atrial junction. The catheter is  ready for immediate use. Electronically Signed   By: Judie Petit.  Shick M.D.   On: 04/01/2023 10:07   IR US Guide Vasc Access Right  Result Date: 04/01/2023 INDICATION: Heart failure patient, Milrinone therapy access EXAM: TUNNELED PICC LINE WITH ULTRASOUND AND FLUOROSCOPIC GUIDANCE MEDICATIONS: 1% lidocaine local. ANESTHESIA/SEDATION: None. FLUOROSCOPY: Radiation Exposure Index (as provided by the fluoroscopic device): 9.0 mGy Kerma COMPLICATIONS: None immediate. PROCEDURE: Informed written consent was obtained from the patient after a discussion of the risks, benefits, and alternatives to treatment. Questions regarding the procedure were encouraged and answered. The right neck and chest were prepped with chlorhexidine in a sterile fashion, and a sterile drape was applied covering the operative field. Maximum barrier sterile technique with sterile gowns and gloves were used for the procedure. A timeout was performed prior to the initiation of the procedure. After creating a small venotomy incision, a micropuncture kit was utilized to access the right internal jugular vein under direct, real-time ultrasound guidance after the overlying soft tissues were anesthetized with 1% lidocaine with epinephrine. Ultrasound image documentation was performed. The microwire was kinked to measure appropriate catheter length. The micropuncture sheath was exchanged for a peel-away sheath over a guidewire. A 5 French dual lumen tunneled PICC measuring 28 cm was tunneled in a retrograde fashion from the anterior chest wall to the venotomy incision. The catheter was then placed through the peel-away sheath with tip ultimately positioned at the superior caval-atrial junction. Final catheter positioning was confirmed and documented with a spot radiographic image. The catheter aspirates and flushes normally. The catheter was flushed with appropriate volume heparin dwells. The catheter exit site was secured with a 0-Prolene retention suture.  The venotomy incision was closed with an interrupted 4-0 Vicryl, Dermabond and Steri-strips. Dressings were applied. The patient tolerated the procedure well without immediate post procedural complication. FINDINGS: After catheter placement, the tip lies within the superior cavoatrial junction. The catheter aspirates and flushes normally and is ready for immediate use. IMPRESSION: Successful placement of 28cm dual lumen tunneled PICC catheter via the right internal jugular vein with tip terminating at the superior caval atrial junction. The catheter is ready for immediate use. Electronically Signed   By: Judie Petit.  Shick M.D.   On: 04/01/2023 10:07    Scheduled Meds:  amiodarone  200 mg Oral Daily   Chlorhexidine Gluconate Cloth  6 each Topical Daily   furosemide  40 mg Intravenous BID   guaiFENesin  600 mg Oral BID   hydrocortisone cream   Topical TID   insulin  aspart  0-5 Units Subcutaneous QHS   insulin aspart  0-6 Units Subcutaneous TID WC   midodrine  10 mg Oral TID WC   polyethylene glycol  17 g Oral Daily   [START ON 04/03/2023] predniSONE  10 mg Oral Q breakfast   senna-docusate  1 tablet Oral BID   sodium bicarbonate  650 mg Oral BID   sodium chloride flush  10-40 mL Intracatheter Q12H   sodium chloride flush  3 mL Intravenous Q12H   Continuous Infusions:  milrinone 0.25 mcg/kg/min (04/02/23 0846)      LOS: 7 days   Burnadette Pop, MD [facility] P12/06/2022, 10:39 AM

## 2023-04-02 NOTE — Progress Notes (Signed)
Occupational Therapy Treatment Patient Details Name: [name] [name] MRN: [id number] DOB: 04-29-1946 Today's Date: 04/02/2023   History of present illness 77 y.o. male presents to [facility] on 03/25/2023 with progressively worsening R hip pain. Pt found to have retroperitoneal hematoma extending into the distal R psoas and R iliacus. Started on antibiotics for possible PNA. Negative [place] for R LE DVT. Pt was recently admitted 11/5-11/8 after a fall and R distal clavicle fx. PMH includes AICD, bladder CA, CHF, CAD, DMII, PAF, PNA. 11/5 right distal clavicle fracture that was managed nonoperatively.   OT comments  Pt progressing toward established OT goals. Reporting premedication facilitated greater pain management this session and pt able to perform bed mobility with up to mod A and transfers with min A+2. Encouraged pt to get up frequently with nursing staff. Will continue to follow.       If plan is discharge home, recommend the following:  A lot of help with walking and/or transfers;Two people to help with bathing/dressing/bathroom   Equipment Recommendations  Other (comment) (TBD)    Recommendations for Other Services      Precautions / Restrictions Precautions Precautions: Fall Precaution Comments: watch BP and HR Restrictions Weight Bearing Restrictions: No Other Position/Activity Restrictions: RUE WBAT allowed per Dr Renford Dills 12/2 10:47 AM       Mobility Bed Mobility Overal bed mobility: Needs Assistance Bed Mobility: Supine to Sit, Sit to Sidelying     Supine to sit: Mod assist, HOB elevated, Used rails     General bed mobility comments: ModA to complete moving LE's off EOB and for trunk elevation. Cues for sequencing.    Transfers Overall transfer level: Needs assistance                       Balance Overall balance assessment: Needs assistance, History of Falls Sitting-balance support: Feet supported, Bilateral upper extremity supported Sitting  balance-Leahy Scale: Fair   Postural control:  (anterior lean)                                 ADL either performed or assessed with clinical judgement   ADL Overall ADL's : Needs assistance/impaired     Grooming: Set up;Sitting                   Toilet Transfer: Moderate assistance;Stand-pivot;Rolling walker (2 wheels);BSC/3in1 Toilet Transfer Details (indicate cue type and reason): simulated                Extremity/Trunk Assessment Upper Extremity Assessment Upper Extremity Assessment: RUE deficits/detail RUE Deficits / Details: Pt using RUE functionally. per notes, MD cleared for weight bearing. Did teach strategies during session to decrese weightbearing through this arm during bed mobility and transfers   Lower Extremity Assessment Lower Extremity Assessment: Defer to PT evaluation        Vision       Perception     Praxis      Cognition Arousal: Alert Behavior During Therapy: Anxious, Flat affect Overall Cognitive Status: Within Functional Limits for tasks assessed Area of Impairment: Safety/judgement, Problem solving                         Safety/Judgement: Decreased awareness of safety   Problem Solving: Requires verbal cues          Exercises      Shoulder Instructions  General Comments      Pertinent Vitals/ Pain       Pain Assessment Faces Pain Scale: Hurts even more Pain Location: R hip Pain Descriptors / Indicators: Tender, Discomfort, Grimacing, Guarding  Home Living                                          Prior Functioning/Environment              Frequency  Min 1X/week        Progress Toward Goals  OT Goals(current goals can now be found in the care plan section)  Progress towards OT goals: Progressing toward goals  Acute Rehab OT Goals Patient Stated Goal: get better OT Goal Formulation: With patient Time For Goal Achievement: 04/12/23 Potential to  Achieve Goals: Good ADL Goals Pt Will Perform Upper Body Bathing: with modified independence;sitting Pt Will Perform Lower Body Bathing: with set-up;with adaptive equipment;sit to/from stand Pt Will Transfer to Toilet: with min assist;ambulating;bedside commode Additional ADL Goal #1: pt will complete bed mobilty supervision level as precursor to adls.  Plan      Co-evaluation                 AM-PAC OT "6 Clicks" Daily Activity     Outcome Measure   Help from another person eating meals?: None Help from another person taking care of personal grooming?: A Little Help from another person toileting, which includes using toliet, bedpan, or urinal?: A Lot Help from another person bathing (including washing, rinsing, drying)?: A Lot Help from another person to put on and taking off regular upper body clothing?: A Little Help from another person to put on and taking off regular lower body clothing?: A Lot 6 Click Score: 16    End of Session Equipment Utilized During Treatment: Gait belt;Rolling walker (2 wheels)  OT Visit Diagnosis: Unsteadiness on feet (R26.81);History of falling (Z91.81);Pain Pain - Right/Left: Right Pain - part of body: Hip   Activity Tolerance Patient tolerated treatment well   Patient Left in chair;with call bell/phone within reach;with chair alarm set   Nurse Communication Mobility status;Precautions        Time: 1610-9604 OT Time Calculation (min): 21 min  Charges: OT General Charges $OT Visit: 1 Visit OT Treatments $Self Care/Home Management : 8-22 mins  Tyler Deis, OTR/L [facility] Acute Rehabilitation Office: [phone]   Myrla Halsted 04/02/2023, 12:25 PM

## 2023-04-02 NOTE — Progress Notes (Signed)
Patient ID: [name] [name], male   DOB: October 11, 1945, 77 y.o.   MRN: [id number] [facility] Progress Note   Assessment/ Plan:   1. Acute kidney Injury on chronic kidney disease stage IIIb (baseline creatinine around 2): Underlying chronic kidney disease suspected to be cardiorenal with acute injury likely from ischemic ATN in the setting of hypotension with ongoing ARB use (Entresto).  No evidence of obstruction associated with retroperitoneal hematoma.  His urine output has picked up notably overnight with improvement of renal function/creatinine following initiation of milrinone drip.  No acute indication for dialysis and we will continue to monitor UOP/labs. 2.  Fall/right retroperitoneal hematoma: Developed traumatic multilocular acute versus subacute retroperitoneal hematoma with intramuscular hemorrhage within the right iliacus muscle.  Apixaban discontinued and ongoing symptomatic management. 3.  Acute exacerbation of chronic systolic heart failure: Torsemide and Entresto originally held in the setting of acute kidney injury and he is now on scheduled furosemide/milrinone drip for volume unloading 4.  Anemia: Mild and secondary to underlying chronic kidney disease/chronic illness with exacerbation due to retroperitoneal hemorrhage.  No indication for PRBC transfusion. 5.  Atrial fibrillation: Currently in sinus rhythm that is rate controlled while on amiodarone.  Subjective:   Reports some improvement of his breathing/energy this morning.   Objective:   BP (!) 118/45 (BP Location: Left Arm)   Pulse 63   Temp 97.6 F (36.4 C) (Oral)   Resp 20   Ht 6\' 2"  (1.88 m)   Wt 93.2 kg   SpO2 92%   BMI 26.38 kg/m   Intake/Output Summary (Last 24 hours) at 04/02/2023 8657 Last data filed at 04/02/2023 8469 Gross per 24 hour  Intake 130 ml  Output 1100 ml  Net -970 ml   Weight change: -1.7 kg  Physical Exam: Gen: Comfortably resting in bed, awake/alert CVS: Pulse regular  rhythm, normal rate, S1 and S2 normal Resp: Diminished breath sounds over bases due to poor inspiratory effort, no rales/rhonchi Abd: Soft, flat, nontender, bowel sounds normal Ext: 3+ right lower extremity edema, 1+ left lower extremity edema  Imaging: IR Fluoro Guide CV Line Right  Result Date: 04/01/2023 INDICATION: Heart failure patient, Milrinone therapy access EXAM: TUNNELED PICC LINE WITH ULTRASOUND AND FLUOROSCOPIC GUIDANCE MEDICATIONS: 1% lidocaine local. ANESTHESIA/SEDATION: None. FLUOROSCOPY: Radiation Exposure Index (as provided by the fluoroscopic device): 9.0 mGy Kerma COMPLICATIONS: None immediate. PROCEDURE: Informed written consent was obtained from the patient after a discussion of the risks, benefits, and alternatives to treatment. Questions regarding the procedure were encouraged and answered. The right neck and chest were prepped with chlorhexidine in a sterile fashion, and a sterile drape was applied covering the operative field. Maximum barrier sterile technique with sterile gowns and gloves were used for the procedure. A timeout was performed prior to the initiation of the procedure. After creating a small venotomy incision, a micropuncture kit was utilized to access the right internal jugular vein under direct, real-time ultrasound guidance after the overlying soft tissues were anesthetized with 1% lidocaine with epinephrine. Ultrasound image documentation was performed. The microwire was kinked to measure appropriate catheter length. The micropuncture sheath was exchanged for a peel-away sheath over a guidewire. A 5 French dual lumen tunneled PICC measuring 28 cm was tunneled in a retrograde fashion from the anterior chest wall to the venotomy incision. The catheter was then placed through the peel-away sheath with tip ultimately positioned at the superior caval-atrial junction. Final catheter positioning was confirmed and documented with a spot radiographic image. The catheter  aspirates and flushes normally. The catheter was flushed with appropriate volume heparin dwells. The catheter exit site was secured with a 0-Prolene retention suture. The venotomy incision was closed with an interrupted 4-0 Vicryl, Dermabond and Steri-strips. Dressings were applied. The patient tolerated the procedure well without immediate post procedural complication. FINDINGS: After catheter placement, the tip lies within the superior cavoatrial junction. The catheter aspirates and flushes normally and is ready for immediate use. IMPRESSION: Successful placement of 28cm dual lumen tunneled PICC catheter via the right internal jugular vein with tip terminating at the superior caval atrial junction. The catheter is ready for immediate use. Electronically Signed   By: Judie Petit.  Shick M.D.   On: 04/01/2023 10:07   IR US Guide Vasc Access Right  Result Date: 04/01/2023 INDICATION: Heart failure patient, Milrinone therapy access EXAM: TUNNELED PICC LINE WITH ULTRASOUND AND FLUOROSCOPIC GUIDANCE MEDICATIONS: 1% lidocaine local. ANESTHESIA/SEDATION: None. FLUOROSCOPY: Radiation Exposure Index (as provided by the fluoroscopic device): 9.0 mGy Kerma COMPLICATIONS: None immediate. PROCEDURE: Informed written consent was obtained from the patient after a discussion of the risks, benefits, and alternatives to treatment. Questions regarding the procedure were encouraged and answered. The right neck and chest were prepped with chlorhexidine in a sterile fashion, and a sterile drape was applied covering the operative field. Maximum barrier sterile technique with sterile gowns and gloves were used for the procedure. A timeout was performed prior to the initiation of the procedure. After creating a small venotomy incision, a micropuncture kit was utilized to access the right internal jugular vein under direct, real-time ultrasound guidance after the overlying soft tissues were anesthetized with 1% lidocaine with epinephrine.  Ultrasound image documentation was performed. The microwire was kinked to measure appropriate catheter length. The micropuncture sheath was exchanged for a peel-away sheath over a guidewire. A 5 French dual lumen tunneled PICC measuring 28 cm was tunneled in a retrograde fashion from the anterior chest wall to the venotomy incision. The catheter was then placed through the peel-away sheath with tip ultimately positioned at the superior caval-atrial junction. Final catheter positioning was confirmed and documented with a spot radiographic image. The catheter aspirates and flushes normally. The catheter was flushed with appropriate volume heparin dwells. The catheter exit site was secured with a 0-Prolene retention suture. The venotomy incision was closed with an interrupted 4-0 Vicryl, Dermabond and Steri-strips. Dressings were applied. The patient tolerated the procedure well without immediate post procedural complication. FINDINGS: After catheter placement, the tip lies within the superior cavoatrial junction. The catheter aspirates and flushes normally and is ready for immediate use. IMPRESSION: Successful placement of 28cm dual lumen tunneled PICC catheter via the right internal jugular vein with tip terminating at the superior caval atrial junction. The catheter is ready for immediate use. Electronically Signed   By: Judie Petit.  Shick M.D.   On: 04/01/2023 10:07    Labs: BMET Recent Labs  Lab 03/27/23 0636 03/28/23 0548 03/29/23 0523 03/30/23 0233 03/31/23 0823 04/02/23 0214  NA 134* 131* 131* 132* 130* 131*  K 4.3 4.1 4.6 4.9 4.0 4.1  CL 105 101 103 104 101 102  CO2 20* 18* 19* 20* 18* 20*  GLUCOSE 101* 119* 118* 119* 116* 196*  BUN 37* 48* 55* 63* 67* 65*  CREATININE 2.25* 3.13* 3.26* 3.29* 3.43* 2.58*  CALCIUM 8.1* 8.1* 8.2* 8.2* 8.3* 8.1*   CBC Recent Labs  Lab 03/29/23 0523 03/30/23 0233 04/01/23 1230 04/02/23 0214  WBC 11.3* 11.5* 17.2* 21.7*  HGB 9.8* 9.8* 9.9* 10.5*  HCT 30.8* 31.0*  30.2* 32.3*  MCV 83.5 83.1 83.0 81.6  PLT 489* 587* 742* 786*    Medications:     amiodarone  200 mg Oral Daily   Chlorhexidine Gluconate Cloth  6 each Topical Daily   furosemide  40 mg Intravenous BID   guaiFENesin  600 mg Oral BID   hydrocortisone cream   Topical TID   insulin aspart  0-5 Units Subcutaneous QHS   insulin aspart  0-6 Units Subcutaneous TID WC   midodrine  10 mg Oral TID WC   polyethylene glycol  17 g Oral Daily   [START ON 04/03/2023] predniSONE  10 mg Oral Q breakfast   predniSONE  20 mg Oral Q breakfast   senna-docusate  1 tablet Oral BID   sodium bicarbonate  650 mg Oral BID   sodium chloride flush  10-40 mL Intracatheter Q12H   sodium chloride flush  3 mL Intravenous Q12H   Zetta Bills, MD 04/02/2023, 8:19 AM

## 2023-04-03 ENCOUNTER — Inpatient Hospital Stay (HOSPITAL_COMMUNITY): Payer: Medicare Other

## 2023-04-03 DIAGNOSIS — N179 Acute kidney failure, unspecified: Secondary | ICD-10-CM | POA: Diagnosis not present

## 2023-04-03 DIAGNOSIS — K683 Retroperitoneal hematoma: Secondary | ICD-10-CM | POA: Diagnosis not present

## 2023-04-03 DIAGNOSIS — I509 Heart failure, unspecified: Secondary | ICD-10-CM | POA: Diagnosis not present

## 2023-04-03 DIAGNOSIS — I5043 Acute on chronic combined systolic (congestive) and diastolic (congestive) heart failure: Secondary | ICD-10-CM | POA: Diagnosis not present

## 2023-04-03 DIAGNOSIS — N1832 Chronic kidney disease, stage 3b: Secondary | ICD-10-CM | POA: Diagnosis not present

## 2023-04-03 LAB — COMPREHENSIVE METABOLIC PANEL
ALT: 101 U/L — ABNORMAL HIGH (ref 0–44)
AST: 118 U/L — ABNORMAL HIGH (ref 15–41)
Albumin: 1.7 g/dL — ABNORMAL LOW (ref 3.5–5.0)
Alkaline Phosphatase: 101 U/L (ref 38–126)
Anion gap: 10 (ref 5–15)
BUN: 65 mg/dL — ABNORMAL HIGH (ref 8–23)
CO2: 21 mmol/L — ABNORMAL LOW (ref 22–32)
Calcium: 8 mg/dL — ABNORMAL LOW (ref 8.9–10.3)
Chloride: 100 mmol/L (ref 98–111)
Creatinine, Ser: 2.35 mg/dL — ABNORMAL HIGH (ref 0.61–1.24)
GFR, Estimated: 28 mL/min — ABNORMAL LOW (ref >60)
Glucose, Bld: 138 mg/dL — ABNORMAL HIGH (ref 70–99)
Potassium: 3.4 mmol/L — ABNORMAL LOW (ref 3.5–5.1)
Sodium: 131 mmol/L — ABNORMAL LOW (ref 135–145)
Total Bilirubin: 0.9 mg/dL (ref <1.2)
Total Protein: 4.7 g/dL — ABNORMAL LOW (ref 6.5–8.1)

## 2023-04-03 LAB — COOXEMETRY PANEL
Carboxyhemoglobin: 1.5 % (ref 0.5–1.5)
Methemoglobin: <0.7 % (ref 0.0–1.5)
O2 Saturation: 89.1 %
Total hemoglobin: 9.7 g/dL — ABNORMAL LOW (ref 12.0–16.0)

## 2023-04-03 LAB — CBC
HCT: 27.6 % — ABNORMAL LOW (ref 39.0–52.0)
Hemoglobin: 9 g/dL — ABNORMAL LOW (ref 13.0–17.0)
MCH: 26.4 pg (ref 26.0–34.0)
MCHC: 32.6 g/dL (ref 30.0–36.0)
MCV: 80.9 fL (ref 80.0–100.0)
Platelets: 803 10*3/uL — ABNORMAL HIGH (ref 150–400)
RBC: 3.41 MIL/uL — ABNORMAL LOW (ref 4.22–5.81)
RDW: 17.3 % — ABNORMAL HIGH (ref 11.5–15.5)
WBC: 27.8 10*3/uL — ABNORMAL HIGH (ref 4.0–10.5)
nRBC: 0 % (ref 0.0–0.2)

## 2023-04-03 LAB — HEPATITIS PANEL, ACUTE
HCV Ab: NONREACTIVE
Hep A IgM: NONREACTIVE
Hep B C IgM: NONREACTIVE
Hepatitis B Surface Ag: NONREACTIVE

## 2023-04-03 LAB — GLUCOSE, CAPILLARY
Glucose-Capillary: 130 mg/dL — ABNORMAL HIGH (ref 70–99)
Glucose-Capillary: 151 mg/dL — ABNORMAL HIGH (ref 70–99)
Glucose-Capillary: 187 mg/dL — ABNORMAL HIGH (ref 70–99)
Glucose-Capillary: 233 mg/dL — ABNORMAL HIGH (ref 70–99)

## 2023-04-03 LAB — MAGNESIUM: Magnesium: 2.1 mg/dL (ref 1.7–2.4)

## 2023-04-03 LAB — PROCALCITONIN: Procalcitonin: 0.33 ng/mL

## 2023-04-03 MED ORDER — POTASSIUM CHLORIDE CRYS ER 20 MEQ PO TBCR
20.0000 meq | EXTENDED_RELEASE_TABLET | Freq: Two times a day (BID) | ORAL | Status: AC
  Administered 2023-04-03 – 2023-04-04 (×3): 20 meq via ORAL
  Filled 2023-04-03 (×3): qty 1

## 2023-04-03 MED ORDER — CALAMINE EX LOTN
1.0000 | TOPICAL_LOTION | Freq: Three times a day (TID) | CUTANEOUS | Status: DC
  Administered 2023-04-03 – 2023-04-05 (×6): 1 via TOPICAL
  Filled 2023-04-03: qty 177

## 2023-04-03 MED ORDER — APIXABAN 5 MG PO TABS
5.0000 mg | ORAL_TABLET | Freq: Two times a day (BID) | ORAL | Status: DC
  Administered 2023-04-03 – 2023-04-08 (×11): 5 mg via ORAL
  Filled 2023-04-03 (×11): qty 1

## 2023-04-03 MED ORDER — PIPERACILLIN-TAZOBACTAM 3.375 G IVPB
3.3750 g | Freq: Three times a day (TID) | INTRAVENOUS | Status: DC
  Administered 2023-04-04 – 2023-04-05 (×5): 3.375 g via INTRAVENOUS
  Filled 2023-04-03 (×5): qty 50

## 2023-04-03 MED ORDER — PIPERACILLIN-TAZOBACTAM 3.375 G IVPB 30 MIN
3.3750 g | Freq: Once | INTRAVENOUS | Status: AC
  Administered 2023-04-03: 3.375 g via INTRAVENOUS
  Filled 2023-04-03: qty 50

## 2023-04-03 MED ORDER — HYDROXYZINE HCL 25 MG PO TABS
25.0000 mg | ORAL_TABLET | Freq: Three times a day (TID) | ORAL | Status: DC | PRN
  Administered 2023-04-03 (×2): 25 mg via ORAL
  Filled 2023-04-03 (×2): qty 1

## 2023-04-03 MED ORDER — METHYLPREDNISOLONE SODIUM SUCC 40 MG IJ SOLR
40.0000 mg | Freq: Two times a day (BID) | INTRAMUSCULAR | Status: DC
  Administered 2023-04-03 – 2023-04-04 (×4): 40 mg via INTRAVENOUS
  Filled 2023-04-03 (×4): qty 1

## 2023-04-03 NOTE — Progress Notes (Signed)
Patient ID: [name] [name], male   DOB: 1945-07-23, 77 y.o.   MRN: [id number] [facility] Progress Note   Assessment/ Plan:   1. Acute kidney Injury on chronic kidney disease stage IIIb (baseline creatinine around 2): Underlying chronic kidney disease suspected to be cardiorenal with acute injury likely from ischemic ATN in the setting of hypotension with ongoing ARB use (Entresto).  No evidence of obstruction associated with retroperitoneal hematoma.  Urine output essentially fixed overnight with some improvement of renal function seen on labs this morning.  No critical electrolyte abnormalities, will replace potassium orally. 2.  Fall/right retroperitoneal hematoma: Developed traumatic multilocular acute versus subacute retroperitoneal hematoma with intramuscular hemorrhage within the right iliacus muscle.  Apixaban discontinued and ongoing symptomatic management. 3.  Acute exacerbation of chronic systolic heart failure: Torsemide and Entresto originally held in the setting of acute kidney injury and he is now on scheduled furosemide/milrinone drip for volume unloading 4.  Anemia: Mild and secondary to underlying chronic kidney disease/chronic illness with exacerbation due to retroperitoneal hemorrhage.  No indication for PRBC transfusion. 5.  Atrial fibrillation: Currently in sinus rhythm that is rate controlled while on amiodarone.  Subjective:   Bothered by pruritic rash over his trunk/back and arms.  Denies chest pain or shortness of breath but still feels weak.   Objective:   BP (!) 129/51 (BP Location: Left Arm)   Pulse 62   Temp 97.8 F (36.6 C) (Oral)   Resp 19   Ht 6\' 2"  (1.88 m)   Wt 91.9 kg   SpO2 96%   BMI 26.01 kg/m   Intake/Output Summary (Last 24 hours) at 04/03/2023 0800 Last data filed at 04/03/2023 0531 Gross per 24 hour  Intake 500.82 ml  Output 1120 ml  Net -619.18 ml   Weight change: -1.3 kg  Physical Exam: Gen: Resting comfortably in bed, wife  at bedside.  Splotchy skin rash noted over extremities and trunk CVS: Pulse regular rhythm, normal rate, S1 and S2 normal Resp: Diminished breath sounds over bases due to poor inspiratory effort, no rales/rhonchi Abd: Soft, flat, nontender, bowel sounds normal Ext: 1-2+ right lower extremity edema, trace left lower extremity edema  Imaging: DG CHEST PORT 1 VIEW  Result Date: 04/02/2023 CLINICAL DATA:  77 year old male with shortness of breath and leukocytosis. EXAM: PORTABLE CHEST 1 VIEW COMPARISON:  Portable chest 03/28/2023 and earlier. FINDINGS: Portable AP semi upright view at 0759 hours. Stable cardiomegaly and mediastinal contours. Stable cardiac pacemakers/ICD. Stable lung volumes. No pneumothorax, pulmonary edema, pleural effusion or confluent lung opacity. Pulmonary vascularity appears stable since 03/04/2023. Calcified aortic atherosclerosis. Visualized tracheal air column is within normal limits. Negative visible bowel gas. IMPRESSION: Stable.  No acute cardiopulmonary abnormality. Electronically Signed   By: Odessa Fleming M.D.   On: 04/02/2023 11:26   IR Fluoro Guide CV Line Right  Result Date: 04/01/2023 INDICATION: Heart failure patient, Milrinone therapy access EXAM: TUNNELED PICC LINE WITH ULTRASOUND AND FLUOROSCOPIC GUIDANCE MEDICATIONS: 1% lidocaine local. ANESTHESIA/SEDATION: None. FLUOROSCOPY: Radiation Exposure Index (as provided by the fluoroscopic device): 9.0 mGy Kerma COMPLICATIONS: None immediate. PROCEDURE: Informed written consent was obtained from the patient after a discussion of the risks, benefits, and alternatives to treatment. Questions regarding the procedure were encouraged and answered. The right neck and chest were prepped with chlorhexidine in a sterile fashion, and a sterile drape was applied covering the operative field. Maximum barrier sterile technique with sterile gowns and gloves were used for the procedure. A timeout was performed prior  to the initiation of the  procedure. After creating a small venotomy incision, a micropuncture kit was utilized to access the right internal jugular vein under direct, real-time ultrasound guidance after the overlying soft tissues were anesthetized with 1% lidocaine with epinephrine. Ultrasound image documentation was performed. The microwire was kinked to measure appropriate catheter length. The micropuncture sheath was exchanged for a peel-away sheath over a guidewire. A 5 French dual lumen tunneled PICC measuring 28 cm was tunneled in a retrograde fashion from the anterior chest wall to the venotomy incision. The catheter was then placed through the peel-away sheath with tip ultimately positioned at the superior caval-atrial junction. Final catheter positioning was confirmed and documented with a spot radiographic image. The catheter aspirates and flushes normally. The catheter was flushed with appropriate volume heparin dwells. The catheter exit site was secured with a 0-Prolene retention suture. The venotomy incision was closed with an interrupted 4-0 Vicryl, Dermabond and Steri-strips. Dressings were applied. The patient tolerated the procedure well without immediate post procedural complication. FINDINGS: After catheter placement, the tip lies within the superior cavoatrial junction. The catheter aspirates and flushes normally and is ready for immediate use. IMPRESSION: Successful placement of 28cm dual lumen tunneled PICC catheter via the right internal jugular vein with tip terminating at the superior caval atrial junction. The catheter is ready for immediate use. Electronically Signed   By: Judie Petit.  Shick M.D.   On: 04/01/2023 10:07   IR US Guide Vasc Access Right  Result Date: 04/01/2023 INDICATION: Heart failure patient, Milrinone therapy access EXAM: TUNNELED PICC LINE WITH ULTRASOUND AND FLUOROSCOPIC GUIDANCE MEDICATIONS: 1% lidocaine local. ANESTHESIA/SEDATION: None. FLUOROSCOPY: Radiation Exposure Index (as provided by the  fluoroscopic device): 9.0 mGy Kerma COMPLICATIONS: None immediate. PROCEDURE: Informed written consent was obtained from the patient after a discussion of the risks, benefits, and alternatives to treatment. Questions regarding the procedure were encouraged and answered. The right neck and chest were prepped with chlorhexidine in a sterile fashion, and a sterile drape was applied covering the operative field. Maximum barrier sterile technique with sterile gowns and gloves were used for the procedure. A timeout was performed prior to the initiation of the procedure. After creating a small venotomy incision, a micropuncture kit was utilized to access the right internal jugular vein under direct, real-time ultrasound guidance after the overlying soft tissues were anesthetized with 1% lidocaine with epinephrine. Ultrasound image documentation was performed. The microwire was kinked to measure appropriate catheter length. The micropuncture sheath was exchanged for a peel-away sheath over a guidewire. A 5 French dual lumen tunneled PICC measuring 28 cm was tunneled in a retrograde fashion from the anterior chest wall to the venotomy incision. The catheter was then placed through the peel-away sheath with tip ultimately positioned at the superior caval-atrial junction. Final catheter positioning was confirmed and documented with a spot radiographic image. The catheter aspirates and flushes normally. The catheter was flushed with appropriate volume heparin dwells. The catheter exit site was secured with a 0-Prolene retention suture. The venotomy incision was closed with an interrupted 4-0 Vicryl, Dermabond and Steri-strips. Dressings were applied. The patient tolerated the procedure well without immediate post procedural complication. FINDINGS: After catheter placement, the tip lies within the superior cavoatrial junction. The catheter aspirates and flushes normally and is ready for immediate use. IMPRESSION: Successful  placement of 28cm dual lumen tunneled PICC catheter via the right internal jugular vein with tip terminating at the superior caval atrial junction. The catheter is ready for immediate use. Electronically  Signed   By: Judie Petit.  Shick M.D.   On: 04/01/2023 10:07    Labs: BMET Recent Labs  Lab 03/28/23 0548 03/29/23 0523 03/30/23 0233 03/31/23 0823 04/02/23 0214 04/03/23 0544  NA 131* 131* 132* 130* 131* 131*  K 4.1 4.6 4.9 4.0 4.1 3.4*  CL 101 103 104 101 102 100  CO2 18* 19* 20* 18* 20* 21*  GLUCOSE 119* 118* 119* 116* 196* 138*  BUN 48* 55* 63* 67* 65* 65*  CREATININE 3.13* 3.26* 3.29* 3.43* 2.58* 2.35*  CALCIUM 8.1* 8.2* 8.2* 8.3* 8.1* 8.0*   CBC Recent Labs  Lab 03/30/23 0233 04/01/23 1230 04/02/23 0214 04/03/23 0544  WBC 11.5* 17.2* 21.7* 27.8*  HGB 9.8* 9.9* 10.5* 9.0*  HCT 31.0* 30.2* 32.3* 27.6*  MCV 83.1 83.0 81.6 80.9  PLT 587* 742* 786* 803*    Medications:     amiodarone  200 mg Oral Daily   Chlorhexidine Gluconate Cloth  6 each Topical Daily   furosemide  40 mg Intravenous BID   guaiFENesin  600 mg Oral BID   hydrocortisone cream   Topical TID   insulin aspart  0-5 Units Subcutaneous QHS   insulin aspart  0-6 Units Subcutaneous TID WC   midodrine  10 mg Oral TID WC   polyethylene glycol  17 g Oral Daily   potassium chloride  20 mEq Oral BID   predniSONE  10 mg Oral Q breakfast   senna-docusate  1 tablet Oral BID   sodium bicarbonate  650 mg Oral BID   sodium chloride flush  10-40 mL Intracatheter Q12H   sodium chloride flush  3 mL Intravenous Q12H   Zetta Bills, MD 04/03/2023, 8:00 AM

## 2023-04-03 NOTE — Progress Notes (Signed)
PROGRESS NOTE  [name] [name]  WUJ:[id number] DOB: [birth year] DOA: 03/25/2023 PCP: [name], MD   Brief Narrative: Patient is a 77 year old male with history of hypertension, coronary artery disease, paroxysmal A-fib/flutter, chronic systolic CHF, status post pacemaker placement, peripheral vascular disease, diabetes type 2, bladder cancer, CKD stage IIIb who presented from home with complaint of hip pain after the fall.  He was also admitted on 03/05/2023 and was discharged on 11/24 after he presented with fall at home, found to have right distal clavicle fracture that was managed nonoperatively.  Patient reported worsening progressive right hip pain for last 2 days, unable to ambulate.  On condition he was hemodynamically stable.  Lab work showed WBC count of 14.2, creatinine of 1.9, hemoglobin of 10.9.  CT hip showed evidence of right retroperitoneal hematoma extending into the distal right psoas and right iliacus muscle.  CT chest also showed multifocal groundglass opacities bilaterally.  Started on antibiotics for possible PNA.  PT/OT consulted, recommendation is acute inpatient rehab. Cardiology following for acute on chronic systolic CHF.  Nephrology also following.  Due to severe systolic CHF and volume overload, started on milrinone on 12/2  Assessment & Plan:  Principal Problem:   Retroperitoneal hematoma Active Problems:   Acute on chronic combined systolic and diastolic heart failure (HCC)   Stage 3b chronic kidney disease (HCC)   Low output heart failure (HCC)   AKI (acute kidney injury) (HCC)   Acute on Chronic systolic congestive heart failure:   Status post AICD.  Home torsemide,Entresto on hold. Last echo done on 11/7 showed EF of 20 to 25%.  Elevated BNP. Hospital course remarkable for persistent volume overload.  Started on IV Lasix and milrinone.  Status post PICC line placement  AKI on CKD stage IIIb: Baseline creatinine around 2.5.  Kidney function trended up to 3  . He follows with [facility], nephrology also consulted, given need of persistent diuresis with IV medications and worsening kidney function. Currently on IV Lasix.  Creatinine trending down  Fall/retroperitoneal hematoma:CT right hip showed Retroperitoneal hematoma extending into the distal right psoas and right iliacus muscle. CT imaging also showed multilocular acute to subacute retroperitoneal hematoma with a component noted superiorly along the posterolateral abdominal wall measuring 4.4 x 2.6 cm. Intramuscular hemorrhage within the right iliacus muscle with the dominant collection measuring 4.5 x 8.1 cm.  Continue pain management, supportive care.  This is most likely secondary to fall.  PT/OT consulted, AIR recommended.  Patient lives with his wife, ambulates with help of walker but has not been able to use it recently. Case was discussed with general surgery by ED physician, no surgical intervention recommended.  Community acquired pneumonia: CT imaging showed multilevel groundglass infiltrates bilaterally.He  completed course of antibiotics.  Currently on room air  Right lower extremity swelling: Venous Doppler did not show any DVT  Elevated liver enzymes: Mild, continue to monitor.  Unclear etiology.  Hepatitis panel negative.  Leukocytosis: Unclear etiology.  Worsening.  Afebrile.  Denies any abdominal pain.  Unremarkable UA and chest x-ray.  Blood culture sent:NGTD.  Procalcitonin reassuring.  Will check abdomen CT/pelvis  Atrial flutter/fibrillation: Monitor on telemetry.  Eliquis resumed.    Follows with cardiology as an outpatient.  Currently in normal sinus rhythm.HR well controlled  Hypertension: Currently blood pressure soft.  Hold torsemide, Entresto   Coronary artery disease: Lipitor on hold due to elevated enzymes.  No anginal symptoms.  Type 2 diabetes: Monitor blood sugars, continue sliding scale  Rash: Developed itch maculopapular rash on his chrst, back and  buttocks.he is sexually worsening.  Unclear etiology.  Might have some drug reaction but not sure which one.  Ordered calamine lotion, Solu-Medrol.  Continue hydroxyzine  Hypokalemia: Supplement with potassium  Lung nodule: CT imaging showed 16 mm nodule within the basilar right middle lobe, indeterminate. Repeat CT in  3 months or PET -CT     DVT prophylaxis:Place and maintain sequential compression device Start: 03/26/23 1608 apixaban (ELIQUIS) tablet 5 mg     Code Status: Full Code  Family Communication: Wife at bedside on 11/30  Patient status:Inpatient  Patient is from :Home  Anticipated discharge to:AIR  Estimated DC date:after adequate volume management, cardiology clearance    Consultants: Cardiology, nephrology  Procedures:None  Antimicrobials:  Anti-infectives (From admission, onward)    Start     Dose/Rate Route Frequency Ordered Stop   03/27/23 0600  cefTRIAXone (ROCEPHIN) 1 g in sodium chloride 0.9 % 100 mL IVPB        1 g 200 mL/hr over 30 Minutes Intravenous Every 24 hours 03/26/23 0821 03/30/23 0549   03/27/23 0600  azithromycin (ZITHROMAX) 500 mg in sodium chloride 0.9 % 250 mL IVPB  Status:  Discontinued        500 mg 250 mL/hr over 60 Minutes Intravenous Every 24 hours 03/26/23 0821 03/28/23 0934   03/26/23 0615  cefTRIAXone (ROCEPHIN) 1 g in sodium chloride 0.9 % 100 mL IVPB        1 g 200 mL/hr over 30 Minutes Intravenous  Once 03/26/23 0600 03/26/23 0744   03/26/23 0615  azithromycin (ZITHROMAX) tablet 500 mg        500 mg Oral  Once 03/26/23 0600 03/26/23 0612       Subjective: Patient seen and examined at bedside today.  Hemodynamically stable lying in bed.  He is comfortable but he is bothered by the progressive purpuric macular rash.  Unclear etiology.  Denies abdomen pain, nausea or vomiting.  Had a bowel movement yesterday.  He is on room air.  Denies any worsening shortness of breath or cough.  Objective: Vitals:   04/02/23 1635 04/02/23  1933 04/03/23 0100 04/03/23 0526  BP: (!) 120/47 (!) 130/53  (!) 129/51  Pulse: 61 (!) 59 (!) 57 62  Resp: 13 16 13 19   Temp: 97.7 F (36.5 C) 97.9 F (36.6 C)  97.8 F (36.6 C)  TempSrc: Oral Oral  Oral  SpO2: 96% 96% 94% 96%  Weight:    91.9 kg  Height:        Intake/Output Summary (Last 24 hours) at 04/03/2023 1033 Last data filed at 04/03/2023 0930 Gross per 24 hour  Intake 563.81 ml  Output 1420 ml  Net -856.19 ml   Filed Weights   04/01/23 0500 04/02/23 0429 04/03/23 0526  Weight: 94.9 kg 93.2 kg 91.9 kg    Examination:   General exam: Overall comfortable, not in distress HEENT: PERRL Respiratory system: Mildly diminished sounds bilaterally, no wheezes or crackles  Cardiovascular system: S1 & S2 heard, RRR.  Gastrointestinal system: Abdomen is nondistended, soft and nontender. Central nervous system: Alert and oriented Extremities: Right lower extremity pitting edema, no clubbing ,no cyanosis Skin: Scattered pruritic macular papular rash on the chest, back and buttocks   Data Reviewed: I have personally reviewed following labs and imaging studies  CBC: Recent Labs  Lab 03/29/23 0523 03/30/23 0233 04/01/23 1230 04/02/23 0214 04/03/23 0544  WBC 11.3* 11.5* 17.2* 21.7* 27.8*  HGB  9.8* 9.8* 9.9* 10.5* 9.0*  HCT 30.8* 31.0* 30.2* 32.3* 27.6*  MCV 83.5 83.1 83.0 81.6 80.9  PLT 489* 587* 742* 786* 803*   Basic Metabolic Panel: Recent Labs  Lab 03/29/23 0523 03/30/23 0233 03/31/23 0823 04/02/23 0214 04/03/23 0544  NA 131* 132* 130* 131* 131*  K 4.6 4.9 4.0 4.1 3.4*  CL 103 104 101 102 100  CO2 19* 20* 18* 20* 21*  GLUCOSE 118* 119* 116* 196* 138*  BUN 55* 63* 67* 65* 65*  CREATININE 3.26* 3.29* 3.43* 2.58* 2.35*  CALCIUM 8.2* 8.2* 8.3* 8.1* 8.0*  MG  --   --   --  2.1 2.1     Recent Results (from the past 240 hour(s))  Respiratory (~20 pathogens) panel by PCR     Status: None   Collection Time: 03/26/23  9:47 AM   Specimen: Nasopharyngeal  Swab; Respiratory  Result Value Ref Range Status   Adenovirus NOT DETECTED NOT DETECTED Final   Coronavirus 229E NOT DETECTED NOT DETECTED Final    Comment: (NOTE) The Coronavirus on the Respiratory Panel, DOES NOT test for the novel  Coronavirus (2019 nCoV)    Coronavirus HKU1 NOT DETECTED NOT DETECTED Final   Coronavirus NL63 NOT DETECTED NOT DETECTED Final   Coronavirus OC43 NOT DETECTED NOT DETECTED Final   Metapneumovirus NOT DETECTED NOT DETECTED Final   Rhinovirus / Enterovirus NOT DETECTED NOT DETECTED Final   Influenza A NOT DETECTED NOT DETECTED Final   Influenza B NOT DETECTED NOT DETECTED Final   Parainfluenza Virus 1 NOT DETECTED NOT DETECTED Final   Parainfluenza Virus 2 NOT DETECTED NOT DETECTED Final   Parainfluenza Virus 3 NOT DETECTED NOT DETECTED Final   Parainfluenza Virus 4 NOT DETECTED NOT DETECTED Final   Respiratory Syncytial Virus NOT DETECTED NOT DETECTED Final   Bordetella pertussis NOT DETECTED NOT DETECTED Final   Bordetella Parapertussis NOT DETECTED NOT DETECTED Final   Chlamydophila pneumoniae NOT DETECTED NOT DETECTED Final   Mycoplasma pneumoniae NOT DETECTED NOT DETECTED Final    Comment: Performed at [facility], [address] [place], [place] [address]  SARS Coronavirus 2 by RT PCR (hospital order, performed in [facility] hospital lab) *cepheid single result test* Anterior Nasal Swab     Status: None   Collection Time: 03/26/23  9:47 AM   Specimen: Anterior Nasal Swab  Result Value Ref Range Status   SARS Coronavirus 2 by RT PCR NEGATIVE NEGATIVE Final    Comment: Performed at [facility], [address] [place], [place] [address]  Culture, blood (Routine X 2) w Reflex to ID Panel     Status: None (Preliminary result)   Collection Time: 04/02/23  9:26 AM   Specimen: BLOOD  Result Value Ref Range Status   Specimen Description BLOOD BLOOD RIGHT HAND  Final   Special Requests   Final    BOTTLES DRAWN AEROBIC AND ANAEROBIC Blood  Culture results may not be optimal due to an inadequate volume of blood received in culture bottles   Culture   Final    NO GROWTH < 24 HOURS Performed at [facility], [address], [place], [place] [address]    Report Status PENDING  Incomplete  Culture, blood (Routine X 2) w Reflex to ID Panel     Status: None (Preliminary result)   Collection Time: 04/02/23  9:26 AM   Specimen: BLOOD  Result Value Ref Range Status   Specimen Description BLOOD BLOOD RIGHT  HAND  Final   Special Requests   Final    BOTTLES DRAWN AEROBIC AND ANAEROBIC Blood Culture results may not be optimal due to an inadequate volume of blood received in culture bottles   Culture   Final    NO GROWTH < 24 HOURS Performed at [facility], [address], [place], [place] [address]    Report Status PENDING  Incomplete     Radiology Studies: DG CHEST PORT 1 VIEW  Result Date: 04/02/2023 CLINICAL DATA:  77 year old male with shortness of breath and leukocytosis. EXAM: PORTABLE CHEST 1 VIEW COMPARISON:  Portable chest 03/28/2023 and earlier. FINDINGS: Portable AP semi upright view at 0759 hours. Stable cardiomegaly and mediastinal contours. Stable cardiac pacemakers/ICD. Stable lung volumes. No pneumothorax, pulmonary edema, pleural effusion or confluent lung opacity. Pulmonary vascularity appears stable since 03/04/2023. Calcified aortic atherosclerosis. Visualized tracheal air column is within normal limits. Negative visible bowel gas. IMPRESSION: Stable.  No acute cardiopulmonary abnormality. Electronically Signed   By: Odessa Fleming M.D.   On: 04/02/2023 11:26    Scheduled Meds:  amiodarone  200 mg Oral Daily   apixaban  5 mg Oral BID   calamine  1 Application Topical TID   Chlorhexidine Gluconate Cloth  6 each Topical Daily   furosemide  40 mg Intravenous BID   hydrocortisone cream   Topical TID   insulin aspart  0-5 Units Subcutaneous QHS   insulin aspart  0-6 Units Subcutaneous TID WC    methylPREDNISolone (SOLU-MEDROL) injection  40 mg Intravenous Q12H   midodrine  10 mg Oral TID WC   polyethylene glycol  17 g Oral Daily   potassium chloride  20 mEq Oral BID   senna-docusate  1 tablet Oral BID   sodium bicarbonate  650 mg Oral BID   sodium chloride flush  10-40 mL Intracatheter Q12H   sodium chloride flush  3 mL Intravenous Q12H   Continuous Infusions:  milrinone 0.25 mcg/kg/min (04/03/23 0907)      LOS: 8 days   Burnadette Pop, MD [facility] P12/07/2022, 10:33 AM

## 2023-04-03 NOTE — Progress Notes (Signed)
Pharmacy Antibiotic Note  [name] [name] is a 77 y.o. male to begin Zosyn for sepsis coverage. Completed 5 days of Ceftriaxone for CAP on 11/30; also received Azithromycin x 3 days.   AKI on CKD3b, creatinine improved.   Plan: Zosyn 3.375 gm IV over 30 minutes x 1, then q8hrs (each over 4 hours). Follow renal function, culture data, clinical progress and antibiotic plans.  Height: 6\' 2"  (188 cm) Weight: 91.9 kg (202 lb 9.6 oz) IBW/kg (Calculated) : 82.2  Temp (24hrs), Avg:97.8 F (36.6 C), Min:97.7 F (36.5 C), Max:97.9 F (36.6 C)  Recent Labs  Lab 03/28/23 1434 03/28/23 1723 03/29/23 0523 03/30/23 0233 03/31/23 0823 04/01/23 1230 04/02/23 0214 04/03/23 0544  WBC  --   --  11.3* 11.5*  --  17.2* 21.7* 27.8*  CREATININE  --   --  3.26* 3.29* 3.43*  --  2.58* 2.35*  LATICACIDVEN 1.4 1.2  --   --  1.2  --   --   --     Estimated Creatinine Clearance: 30.6 mL/min (A) (by C-G formula based on SCr of 2.35 mg/dL (H)).    Allergies  Allergen Reactions   Spironolactone Other (See Comments)    Painful gynecomastia   Semaglutide     Nausea and bloating   Tramadol Other (See Comments)    Sleep disturbance.  Apnea spells.   Empagliflozin Rash    Rash at the groin Jardiance   Niacin And Related Hives    Antimicrobials this admission: Ceftriaxone 11/26>>11/30 Azithromycin 11/26>>11/28 Zosyn 12/4 >>  Dose adjustments this admission: n/a  Microbiology results: 11/26 respiratory panel: negative 11/26 COVID PCR: negative 12/3 blood: no growth < 24 hours to date  Thank you for allowing pharmacy to be a part of this patient's care.  Dennie Fetters, [place] 04/03/2023 7:18 PM

## 2023-04-03 NOTE — Progress Notes (Signed)
Physical Therapy Treatment Patient Details Name: [name] [name] MRN: [id number] DOB: 1945-12-27 Today's Date: 04/03/2023   History of Present Illness 77 y.o. male presents to [facility] hospital on 03/25/2023 with progressively worsening R hip pain. Pt found to have retroperitoneal hematoma extending into the distal R psoas and R iliacus. Started on antibiotics for possible PNA. Negative [place] for R LE DVT. Pt was recently admitted 11/5-11/8 after a fall and R distal clavicle fx. PMH includes AICD, bladder CA, CHF, CAD, DMII, PAF, PNA. 11/5 right distal clavicle fracture that was managed nonoperatively.    PT Comments  Pt is progressing towards goals. Pt was able to perform bed mobility at 2 person Min A and sit to stand at 2 person Mod A from elevated EOB. This session pt was able to slightly improved gait distance to 15 ft at 2 person assist.  Due to pt current level of function, PLOF, home set up and supportive family assist available at discharge recommending skilled physical therapy services > 3 hours/day on discharge from acute care hospital setting to address strength, balance and functional mobility to decrease risk for falls, injury, immobility and re-hospitalization. Pt was fatigued at end of session.    If plan is discharge home, recommend the following: Assist for transportation;Help with stairs or ramp for entrance;A lot of help with walking and/or transfers;Assistance with cooking/housework     Equipment Recommendations  Other (comment) (defer to post acute)       Precautions / Restrictions Precautions Precautions: Fall Precaution Comments: watch BP and HR Restrictions Weight Bearing Restrictions: No RUE Weight Bearing: Weight bearing as tolerated Other Position/Activity Restrictions: RUE WBAT allowed per Dr Renford Dills 12/2 10:47 AM     Mobility  Bed Mobility Overal bed mobility: Needs Assistance Bed Mobility: Supine to Sit, Sit to Supine     Supine to sit: Min assist, +2 for  safety/equipment Sit to supine: Min assist, +2 for physical assistance   General bed mobility comments: Min A for upper body to get to mid line from supine with second person for steadying and Min A +2 for LE and trunk for sitting to supine.    Transfers Overall transfer level: Needs assistance Equipment used: Rolling walker (2 wheels) Transfers: Sit to/from Stand Sit to Stand: Mod assist, +2 physical assistance, From elevated surface           General transfer comment: Pt attempted from lowered EOB and was unable to get to standing. States he has elevated bed, chair and toilet riser at home. Spouse assist when standing from low surfaces.    Ambulation/Gait Ambulation/Gait assistance: Min assist, +2 physical assistance Gait Distance (Feet): 15 Feet Assistive device: Rolling walker (2 wheels) Gait Pattern/deviations: Step-to pattern, Decreased stride length, Shuffle Gait velocity: decreased Gait velocity interpretation: <1.31 ft/sec, indicative of household ambulator   General Gait Details: Pt started to fatigue and was not answering questions. Pt was assisted back with retro stepping toward EOB to sit. When asked pt was unclear why he needed a rest and stopped ambulating. VS WNL       Balance Overall balance assessment: Needs assistance, History of Falls Sitting-balance support: Feet supported, Bilateral upper extremity supported Sitting balance-Leahy Scale: Fair Sitting balance - Comments: sitting EOB at SBA no LOB   Standing balance support: Reliant on assistive device for balance, During functional activity, Bilateral upper extremity supported Standing balance-Leahy Scale: Poor Standing balance comment: chair follow, +2 reliant on external support and AD.  Cognition Arousal: Alert Behavior During Therapy: WFL for tasks assessed/performed Overall Cognitive Status: Within Functional Limits for tasks assessed     General Comments: fatigued quickly and sat  EOB; difficult to assess during function due to pt was not answering questions.           General Comments General comments (skin integrity, edema, etc.): No deviations in O2 sats or HR throughout session      Pertinent Vitals/Pain Pain Assessment Pain Assessment: Faces Faces Pain Scale: Hurts little more Breathing: normal Pain Location: R hip Pain Descriptors / Indicators: Tender, Discomfort, Grimacing, Guarding Pain Intervention(s): Monitored during session, Limited activity within patient's tolerance, Repositioned     PT Goals (current goals can now be found in the care plan section) Acute Rehab PT Goals Patient Stated Goal: to get better PT Goal Formulation: With patient Time For Goal Achievement: 04/10/23 Potential to Achieve Goals: Good Progress towards PT goals: Progressing toward goals    Frequency    Min 1X/week      PT Plan  Continue with current POC       AM-PAC PT "6 Clicks" Mobility   Outcome Measure  Help needed turning from your back to your side while in a flat bed without using bedrails?: A Little Help needed moving from lying on your back to sitting on the side of a flat bed without using bedrails?: A Lot Help needed moving to and from a bed to a chair (including a wheelchair)?: A Lot Help needed standing up from a chair using your arms (e.g., wheelchair or bedside chair)?: Total Help needed to walk in hospital room?: Total Help needed climbing 3-5 steps with a railing? : Total 6 Click Score: 10    End of Session Equipment Utilized During Treatment: Gait belt Activity Tolerance: Patient limited by fatigue;Patient limited by pain Patient left: with call bell/phone within reach;in bed;with bed alarm set Nurse Communication: Mobility status PT Visit Diagnosis: Unsteadiness on feet (R26.81);Repeated falls (R29.6);Other abnormalities of gait and mobility (R26.89)     Time: 1046-1110 PT Time Calculation (min) (ACUTE ONLY): 24 min  Charges:     $Therapeutic Activity: 23-37 mins PT General Charges $$ ACUTE PT VISIT: 1 Visit                    Harrel Carina, DPT, CLT  Acute Rehabilitation Services Office: [phone] (Secure chat preferred)    Claudia Desanctis 04/03/2023, 1:12 PM

## 2023-04-03 NOTE — Progress Notes (Signed)
Rounding Note    Patient Name: [name] [name] Date of Encounter: 04/03/2023  [facility] Cardiologist: Arvilla Meres, MD   Subjective   Continues to improve on milrinone. Has pruritic rash today, started on back several days ago but has now progressed to diffuse, though appears more confluent on areas that are prone to sweat (back, lateral ribs/medial arms, folds). Mouth also has some soreness on roof of mouth. Has never had rash like this before. No breathing issues.  Inpatient Medications    Scheduled Meds:  amiodarone  200 mg Oral Daily   Chlorhexidine Gluconate Cloth  6 each Topical Daily   furosemide  40 mg Intravenous BID   guaiFENesin  600 mg Oral BID   hydrocortisone cream   Topical TID   insulin aspart  0-5 Units Subcutaneous QHS   insulin aspart  0-6 Units Subcutaneous TID WC   midodrine  10 mg Oral TID WC   polyethylene glycol  17 g Oral Daily   potassium chloride  20 mEq Oral BID   predniSONE  10 mg Oral Q breakfast   senna-docusate  1 tablet Oral BID   sodium bicarbonate  650 mg Oral BID   sodium chloride flush  10-40 mL Intracatheter Q12H   sodium chloride flush  3 mL Intravenous Q12H   Continuous Infusions:  milrinone 0.25 mcg/kg/min (04/02/23 2234)   PRN Meds: albuterol, benzonatate, HYDROmorphone (DILAUDID) injection, hydrOXYzine, melatonin, ondansetron **OR** ondansetron (ZOFRAN) IV, oxyCODONE, polyethylene glycol, sodium chloride flush, sodium chloride flush   Vital Signs    Vitals:   04/02/23 1635 04/02/23 1933 04/03/23 0100 04/03/23 0526  BP: (!) 120/47 (!) 130/53  (!) 129/51  Pulse: 61 (!) 59 (!) 57 62  Resp: 13 16 13 19   Temp: 97.7 F (36.5 C) 97.9 F (36.6 C)  97.8 F (36.6 C)  TempSrc: Oral Oral  Oral  SpO2: 96% 96% 94% 96%  Weight:    91.9 kg  Height:        Intake/Output Summary (Last 24 hours) at 04/03/2023 0847 Last data filed at 04/03/2023 0531 Gross per 24 hour  Intake 500.82 ml  Output 1120 ml  Net -619.18 ml       04/03/2023    5:26 AM 04/02/2023    4:29 AM 04/01/2023    5:00 AM  Last 3 Weights  Weight (lbs) 202 lb 9.6 oz 205 lb 7.5 oz 209 lb 3.5 oz  Weight (kg) 91.9 kg 93.2 kg 94.9 kg      Telemetry    SR - Personally Reviewed  Physical Exam   GEN: No acute distress.  O2 by nasal cannula Neck: JVD at clavicle at 45 degrees Cardiac: RRR, no rubs, or gallops. 2/6 systolic murmur Respiratory: Improving breath sounds bilaterally GI: Soft, nontender, non-distended  MS: No edema; No deformity. Neuro:  Nonfocal  Psych: Normal affect  Skin: diffuse erythematous papules across body, most highly concentrated in area between lateral ribs/along medial upper arms, across back, and in folds of skin. Oropharynx examined, no lesions noted  New pertinent results (labs, ECG, imaging, cardiac studies)     Patient Profile     77 y.o. male with hx of COPD, type 2 diabetes, coronary artery disease status post CABG in 2018 with stage D systolic heart failure status post CCM, hypertension, history of bladder cancer and paroxysmal atrial fibrillation, admitted 11/26 with large retroperitoneal hematoma (home Eliquis now held) and shortness of breath.   Assessment & Plan    Acute on  chronic systolic and diastolic heart failure Ischemic cardiomyopathy Hypotension AKI on CKD stage 3b -EF 20-25% -nephrology following, no acute indications for HD -no sglt2i due to yeast infections -hyperkalemia/gynecomastia on spironolactone -on midodrine, no BP room for GDMT -Cr today 2.35, down from 3.43 12/2, baseline ~2, minimal urine output with lasix prior to milrinone -started milrinone 12/2 with improvement in urine output and Cr.  Made 1100 cc urine yesterday. Initial coox 64, was 89 this AM, ?accuracy -blood pressure stable on milrinone, have not needed to add levophed, continue midodrine -admission weight 89.5 kg, peak weight 94.9 kg 12/2, weight today 91.9 kg -has not had significant ectopy on milrinone -K  low at 3.4 today, repletion of 60 meq pending   RLE edema -negative for DVT -may be in the setting of venous/lymphatic compression from RP hematoma given right sided location of hematoma (with notable iliopsoas and iliacus intramuscular hemorrhage) -continues to improve   Atrial flutter RP hematoma -DCCV 11/7, but anticoagulation on hold given RP bleed -hgb dropped from 10.5 to 9.0 today. Monitor   CAD History of 2V CABG 2018  Rash -my concern was for drug eruption from milrinone, but his wife notes that he started getting spots on his back even prior to milrinone, and this has been progressive.  -has been started on hydroxysine and topical hydrocortisone as well as solumedrol  Severe deconditioning -cannot even sit up in bed without assistance. Per primary team    Signed, Jodelle Red, MD  04/03/2023, 8:47 AM

## 2023-04-04 DIAGNOSIS — N1832 Chronic kidney disease, stage 3b: Secondary | ICD-10-CM | POA: Diagnosis not present

## 2023-04-04 DIAGNOSIS — I509 Heart failure, unspecified: Secondary | ICD-10-CM | POA: Diagnosis not present

## 2023-04-04 DIAGNOSIS — N179 Acute kidney failure, unspecified: Secondary | ICD-10-CM | POA: Diagnosis not present

## 2023-04-04 DIAGNOSIS — I5043 Acute on chronic combined systolic (congestive) and diastolic (congestive) heart failure: Secondary | ICD-10-CM | POA: Diagnosis not present

## 2023-04-04 DIAGNOSIS — K683 Retroperitoneal hematoma: Secondary | ICD-10-CM | POA: Diagnosis not present

## 2023-04-04 LAB — BASIC METABOLIC PANEL
Anion gap: 7 (ref 5–15)
BUN: 63 mg/dL — ABNORMAL HIGH (ref 8–23)
CO2: 25 mmol/L (ref 22–32)
Calcium: 7.9 mg/dL — ABNORMAL LOW (ref 8.9–10.3)
Chloride: 100 mmol/L (ref 98–111)
Creatinine, Ser: 2.23 mg/dL — ABNORMAL HIGH (ref 0.61–1.24)
GFR, Estimated: 30 mL/min — ABNORMAL LOW (ref >60)
Glucose, Bld: 250 mg/dL — ABNORMAL HIGH (ref 70–99)
Potassium: 4.2 mmol/L (ref 3.5–5.1)
Sodium: 132 mmol/L — ABNORMAL LOW (ref 135–145)

## 2023-04-04 LAB — CBC
HCT: 27.4 % — ABNORMAL LOW (ref 39.0–52.0)
Hemoglobin: 8.7 g/dL — ABNORMAL LOW (ref 13.0–17.0)
MCH: 25.7 pg — ABNORMAL LOW (ref 26.0–34.0)
MCHC: 31.8 g/dL (ref 30.0–36.0)
MCV: 81.1 fL (ref 80.0–100.0)
Platelets: 783 10*3/uL — ABNORMAL HIGH (ref 150–400)
RBC: 3.38 MIL/uL — ABNORMAL LOW (ref 4.22–5.81)
RDW: 17.6 % — ABNORMAL HIGH (ref 11.5–15.5)
WBC: 18.8 10*3/uL — ABNORMAL HIGH (ref 4.0–10.5)
nRBC: 0 % (ref 0.0–0.2)

## 2023-04-04 LAB — GLUCOSE, CAPILLARY
Glucose-Capillary: 236 mg/dL — ABNORMAL HIGH (ref 70–99)
Glucose-Capillary: 263 mg/dL — ABNORMAL HIGH (ref 70–99)
Glucose-Capillary: 330 mg/dL — ABNORMAL HIGH (ref 70–99)
Glucose-Capillary: 281 mg/dL — ABNORMAL HIGH (ref 70–99)

## 2023-04-04 LAB — MAGNESIUM: Magnesium: 2 mg/dL (ref 1.7–2.4)

## 2023-04-04 LAB — COOXEMETRY PANEL
Carboxyhemoglobin: 1.5 % (ref 0.5–1.5)
Methemoglobin: <0.7 % (ref 0.0–1.5)
O2 Saturation: 85.5 %
Total hemoglobin: 9.2 g/dL — ABNORMAL LOW (ref 12.0–16.0)

## 2023-04-04 MED ORDER — DIPHENHYDRAMINE HCL 50 MG/ML IJ SOLN: 12.5000 mg | Freq: Once | INTRAMUSCULAR | Status: DC

## 2023-04-04 MED ORDER — PNEUMOCOCCAL 20-VAL CONJ VACC 0.5 ML IM SUSY
0.5000 mL | PREFILLED_SYRINGE | INTRAMUSCULAR | Status: DC
  Filled 2023-04-04: qty 0.5

## 2023-04-04 MED ORDER — INSULIN GLARGINE-YFGN 100 UNIT/ML ~~LOC~~ SOLN
8.0000 [IU] | Freq: Every day | SUBCUTANEOUS | Status: DC
  Administered 2023-04-04 – 2023-04-06 (×3): 8 [IU] via SUBCUTANEOUS
  Filled 2023-04-04 (×3): qty 0.08

## 2023-04-04 MED ORDER — INFLUENZA VAC A&B SURF ANT ADJ 0.5 ML IM SUSY
0.5000 mL | PREFILLED_SYRINGE | INTRAMUSCULAR | Status: DC
  Filled 2023-04-04: qty 0.5

## 2023-04-04 MED ORDER — DIPHENHYDRAMINE HCL 50 MG/ML IJ SOLN
25.0000 mg | Freq: Once | INTRAMUSCULAR | Status: AC
  Administered 2023-04-04: 25 mg via INTRAVENOUS
  Filled 2023-04-04: qty 1

## 2023-04-04 MED ORDER — MAGIC MOUTHWASH
10.0000 mL | Freq: Four times a day (QID) | ORAL | Status: DC | PRN
  Administered 2023-04-04: 10 mL via ORAL
  Administered 2023-04-05: 5 mL via ORAL
  Filled 2023-04-04 (×3): qty 10

## 2023-04-04 MED ORDER — INSULIN ASPART 100 UNIT/ML IJ SOLN
0.0000 [IU] | Freq: Three times a day (TID) | INTRAMUSCULAR | Status: DC
  Administered 2023-04-04: 11 [IU] via SUBCUTANEOUS
  Administered 2023-04-05 (×2): 5 [IU] via SUBCUTANEOUS
  Administered 2023-04-05: 8 [IU] via SUBCUTANEOUS
  Administered 2023-04-06: 10 [IU] via SUBCUTANEOUS
  Administered 2023-04-06 – 2023-04-07 (×2): 5 [IU] via SUBCUTANEOUS
  Administered 2023-04-07: 2 [IU] via SUBCUTANEOUS
  Administered 2023-04-07: 3 [IU] via SUBCUTANEOUS
  Administered 2023-04-08: 2 [IU] via SUBCUTANEOUS
  Administered 2023-04-08: 5 [IU] via SUBCUTANEOUS
  Administered 2023-04-08: 2 [IU] via SUBCUTANEOUS

## 2023-04-04 NOTE — Progress Notes (Signed)
PT Note:  SATURATION QUALIFICATIONS: (This note is used to comply with regulatory documentation for home oxygen)  Patient Saturations on Room Air at Rest = 89%  Patient Saturations on Room Air while Ambulating = 87%  Patient Saturations on 2 Liters of oxygen while Ambulating = 91-95%  Please briefly explain why patient needs home oxygen: Pt with O2 desaturation below 88% with activity on RA.   Lyanne Co, PT  Acute Rehab Services Secure chat preferred Office [phone]

## 2023-04-04 NOTE — Progress Notes (Signed)
PROGRESS NOTE  [name] [name]  PPI:[id number] DOB: 1946-04-30 DOA: 03/25/2023 PCP: [name], MD   Brief Narrative: Patient is a 77 year old male with history of hypertension, coronary artery disease, paroxysmal A-fib/flutter, chronic systolic CHF, status post pacemaker placement, peripheral vascular disease, diabetes type 2, bladder cancer, CKD stage IIIb who presented from home with complaint of hip pain after the fall.  He was also admitted on 03/05/2023 and was discharged on 11/24 after he presented with fall at home, found to have right distal clavicle fracture that was managed nonoperatively.  Patient reported worsening progressive right hip pain for last 2 days, unable to ambulate.  On condition he was hemodynamically stable.  Lab work showed WBC count of 14.2, creatinine of 1.9, hemoglobin of 10.9.  CT hip showed evidence of right retroperitoneal hematoma extending into the distal right psoas and right iliacus muscle.  CT chest also showed multifocal groundglass opacities bilaterally.  Started on antibiotics for possible PNA.  PT/OT consulted, recommendation is acute inpatient rehab. Cardiology following for acute on chronic systolic CHF.  Nephrology also following.  Due to severe systolic CHF and volume overload, started on milrinone on 12/2  Assessment & Plan:  Principal Problem:   Retroperitoneal hematoma Active Problems:   Acute on chronic combined systolic and diastolic heart failure (HCC)   Stage 3b chronic kidney disease (HCC)   Low output heart failure (HCC)   AKI (acute kidney injury) (HCC)   Acute on Chronic systolic congestive heart failure:   Status post AICD.  Home torsemide,Entresto on hold. Last echo done on 11/7 showed EF of 20 to 25%.  Elevated BNP. Hospital course remarkable for persistent volume overload.  Started on IV Lasix and milrinone.  Status post PICC line placement  AKI on CKD stage IIIb: Baseline creatinine around 2.5.  Kidney function trended up to 3  . He follows with [facility], nephrology also consulted, given need of persistent diuresis with IV medications and worsening kidney function. Currently on IV Lasix.  Creatinine trending down  Fall/retroperitoneal hematoma:CT right hip showed Retroperitoneal hematoma extending into the distal right psoas and right iliacus muscle. CT imaging also showed multilocular acute to subacute retroperitoneal hematoma with a component noted superiorly along the posterolateral abdominal wall measuring 4.4 x 2.6 cm. Intramuscular hemorrhage within the right iliacus muscle with the dominant collection measuring 4.5 x 8.1 cm.  Continue pain management, supportive care.  This is most likely secondary to fall.  PT/OT consulted, AIR recommended.  Patient lives with his wife, ambulates with help of walker but has not been able to use it recently. Case was discussed with general surgery by ED physician, no surgical intervention recommended.  Community acquired pneumonia: CT imaging showed multilevel groundglass infiltrates bilaterally.He  completed course of antibiotics.  Currently on room air  Right lower extremity swelling: Venous Doppler did not show any DVT  Elevated liver enzymes: Mild, continue to monitor.  Unclear etiology.  Hepatitis panel negative.  Leukocytosis: Unclear etiology. Afebrile.  Denies any abdominal pain.  Unremarkable UA and chest x-ray.  Blood culture sent:NGTD.  Procalcitonin reassuring. Abdomen CT abdomen/pelvis did not show any acute findings except for right iliopsoas musculature which could be bursitis or hematoma.  Started on empiric Zosyn.  Hematoma infection is a possibility.  Atrial flutter/fibrillation: Monitor on telemetry.  Eliquis resumed.    Follows with cardiology as an outpatient.  Currently in normal sinus rhythm.HR well controlled  Hypertension: Currently blood pressure soft.  Hold torsemide, Entresto   Coronary artery  disease: Lipitor on hold due to elevated enzymes.  No  anginal symptoms.  Type 2 diabetes: Monitor blood sugars, continue sliding scale  Rash: Developed itch maculopapular rash on his chrst, back and buttocks.  Unclear etiology.  Might have some drug reaction but not sure which one.  Ordered calamine lotion, Solu-Medrol.  Continue hydroxyzine.Rash looks better today  Lung nodule: CT imaging showed 16 mm nodule within the basilar right middle lobe, indeterminate. Repeat CT in  3 months or PET -CT     DVT prophylaxis:Place and maintain sequential compression device Start: 03/26/23 1608 apixaban (ELIQUIS) tablet 5 mg     Code Status: Full Code  Family Communication: Wife at bedside on 12/5  Patient status:Inpatient  Patient is from :Home  Anticipated discharge to:AIR  Estimated DC date:after adequate volume management, cardiology clearance    Consultants: Cardiology, nephrology  Procedures:None  Antimicrobials:  Anti-infectives (From admission, onward)    Start     Dose/Rate Route Frequency Ordered Stop   04/04/23 0200  piperacillin-tazobactam (ZOSYN) IVPB 3.375 g        3.375 g 12.5 mL/hr over 240 Minutes Intravenous Every 8 hours 04/03/23 1912     04/03/23 1845  piperacillin-tazobactam (ZOSYN) IVPB 3.375 g        3.375 g 100 mL/hr over 30 Minutes Intravenous  Once 04/03/23 1754 04/04/23 1000   03/27/23 0600  cefTRIAXone (ROCEPHIN) 1 g in sodium chloride 0.9 % 100 mL IVPB        1 g 200 mL/hr over 30 Minutes Intravenous Every 24 hours 03/26/23 0821 03/30/23 0549   03/27/23 0600  azithromycin (ZITHROMAX) 500 mg in sodium chloride 0.9 % 250 mL IVPB  Status:  Discontinued        500 mg 250 mL/hr over 60 Minutes Intravenous Every 24 hours 03/26/23 0821 03/28/23 0934   03/26/23 0615  cefTRIAXone (ROCEPHIN) 1 g in sodium chloride 0.9 % 100 mL IVPB        1 g 200 mL/hr over 30 Minutes Intravenous  Once 03/26/23 0600 03/26/23 0744   03/26/23 0615  azithromycin (ZITHROMAX) tablet 500 mg        500 mg Oral  Once 03/26/23 0600  03/26/23 0612       Subjective: Patient seen and examined at bedside today.  Appears overall comfortable.  Rash looks better today.  Remains on room air.  Denies any worsening shortness of breath.  Had a bowel movement yesterday.  Complains of some discomfort in the mouth today  Objective: Vitals:   04/03/23 2313 04/03/23 2314 04/04/23 0505 04/04/23 0839  BP: (!) 116/47  (!) 114/44 136/71  Pulse:   61 66  Resp: 14  12 17   Temp: 98 F (36.7 C) 98 F (36.7 C) 97.8 F (36.6 C)   TempSrc: Oral  Oral Oral  SpO2: 96%  92% 93%  Weight:   91.6 kg   Height:        Intake/Output Summary (Last 24 hours) at 04/04/2023 1120 Last data filed at 04/04/2023 1000 Gross per 24 hour  Intake 399.08 ml  Output 750 ml  Net -350.92 ml   Filed Weights   04/02/23 0429 04/03/23 0526 04/04/23 0505  Weight: 93.2 kg 91.9 kg 91.6 kg    Examination:  General exam: Overall comfortable, not in distress, pleasant  elderly male HEENT: PERRL Respiratory system:  no wheezes or crackles , mildly  diminished sounds bilaterally Cardiovascular system: S1 & S2 heard, RRR.  Gastrointestinal system: Abdomen is nondistended, soft  and nontender. Central nervous system: Alert and oriented Extremities: right lower extremity pitting  edema, no clubbing ,no cyanosis Skin: Scattered maculopapular rash on the body including face but improving now   Data Reviewed: I have personally reviewed following labs and imaging studies  CBC: Recent Labs  Lab 03/30/23 0233 04/01/23 1230 04/02/23 0214 04/03/23 0544 04/04/23 0600  WBC 11.5* 17.2* 21.7* 27.8* 18.8*  HGB 9.8* 9.9* 10.5* 9.0* 8.7*  HCT 31.0* 30.2* 32.3* 27.6* 27.4*  MCV 83.1 83.0 81.6 80.9 81.1  PLT 587* 742* 786* 803* 783*   Basic Metabolic Panel: Recent Labs  Lab 03/30/23 0233 03/31/23 0823 04/02/23 0214 04/03/23 0544 04/04/23 0600  NA 132* 130* 131* 131* 132*  K 4.9 4.0 4.1 3.4* 4.2  CL 104 101 102 100 100  CO2 20* 18* 20* 21* 25  GLUCOSE 119*  116* 196* 138* 250*  BUN 63* 67* 65* 65* 63*  CREATININE 3.29* 3.43* 2.58* 2.35* 2.23*  CALCIUM 8.2* 8.3* 8.1* 8.0* 7.9*  MG  --   --  2.1 2.1 2.0     Recent Results (from the past 240 hour(s))  Respiratory (~20 pathogens) panel by PCR     Status: None   Collection Time: 03/26/23  9:47 AM   Specimen: Nasopharyngeal Swab; Respiratory  Result Value Ref Range Status   Adenovirus NOT DETECTED NOT DETECTED Final   Coronavirus 229E NOT DETECTED NOT DETECTED Final    Comment: (NOTE) The Coronavirus on the Respiratory Panel, DOES NOT test for the novel  Coronavirus (2019 nCoV)    Coronavirus HKU1 NOT DETECTED NOT DETECTED Final   Coronavirus NL63 NOT DETECTED NOT DETECTED Final   Coronavirus OC43 NOT DETECTED NOT DETECTED Final   Metapneumovirus NOT DETECTED NOT DETECTED Final   Rhinovirus / Enterovirus NOT DETECTED NOT DETECTED Final   Influenza A NOT DETECTED NOT DETECTED Final   Influenza B NOT DETECTED NOT DETECTED Final   Parainfluenza Virus 1 NOT DETECTED NOT DETECTED Final   Parainfluenza Virus 2 NOT DETECTED NOT DETECTED Final   Parainfluenza Virus 3 NOT DETECTED NOT DETECTED Final   Parainfluenza Virus 4 NOT DETECTED NOT DETECTED Final   Respiratory Syncytial Virus NOT DETECTED NOT DETECTED Final   Bordetella pertussis NOT DETECTED NOT DETECTED Final   Bordetella Parapertussis NOT DETECTED NOT DETECTED Final   Chlamydophila pneumoniae NOT DETECTED NOT DETECTED Final   Mycoplasma pneumoniae NOT DETECTED NOT DETECTED Final    Comment: Performed at [facility], [address] [place], [place] [address]  SARS Coronavirus 2 by RT PCR (hospital order, performed in [facility] hospital lab) *cepheid single result test* Anterior Nasal Swab     Status: None   Collection Time: 03/26/23  9:47 AM   Specimen: Anterior Nasal Swab  Result Value Ref Range Status   SARS Coronavirus 2 by RT PCR NEGATIVE NEGATIVE Final    Comment: Performed at [facility], [address],  [place], [place] [address]  Culture, blood (Routine X 2) w Reflex to ID Panel     Status: None (Preliminary result)   Collection Time: 04/02/23  9:26 AM   Specimen: BLOOD  Result Value Ref Range Status   Specimen Description BLOOD BLOOD RIGHT HAND  Final   Special Requests   Final    BOTTLES DRAWN AEROBIC AND ANAEROBIC Blood Culture results may not be optimal due to an inadequate volume of blood received in culture bottles   Culture   Final    NO GROWTH 2  DAYS Performed at [facility], [address] [place], [place] [address]    Report Status PENDING  Incomplete  Culture, blood (Routine X 2) w Reflex to ID Panel     Status: None (Preliminary result)   Collection Time: 04/02/23  9:26 AM   Specimen: BLOOD  Result Value Ref Range Status   Specimen Description BLOOD BLOOD RIGHT HAND  Final   Special Requests   Final    BOTTLES DRAWN AEROBIC AND ANAEROBIC Blood Culture results may not be optimal due to an inadequate volume of blood received in culture bottles   Culture   Final    NO GROWTH 2 DAYS Performed at [facility], [address] [place], [place] [address]    Report Status PENDING  Incomplete     Radiology Studies: CT ABDOMEN PELVIS WO CONTRAST  Result Date: 04/03/2023 CLINICAL DATA:  Sepsis.  Hip pain. EXAM: CT ABDOMEN AND PELVIS WITHOUT CONTRAST TECHNIQUE: Multidetector CT imaging of the abdomen and pelvis was performed following the standard protocol without IV contrast. RADIATION DOSE REDUCTION: This exam was performed according to the departmental dose-optimization program which includes automated exposure control, adjustment of the mA and/or kV according to patient size and/or use of iterative reconstruction technique. COMPARISON:  Abdominopelvic CT 03/26/2023.  Pelvic CT 04/16/2019. FINDINGS: Lower chest: Cardiac pacemaker leads extend into the right atrium and right ventricle. There is aortic and coronary artery atherosclerosis. Unchanged moderate right and small  left pleural effusions with associated compressive atelectasis at both lung bases. The recently demonstrated right middle lobe nodule is incompletely visualized, although grossly stable. No new nodularity identified at the visualized lung bases. There is underlying mild to moderate centrilobular emphysema. Hepatobiliary: The liver appears stable, without focal abnormality on noncontrast imaging. No evidence of significant biliary dilatation status post cholecystectomy. Pancreas: Unremarkable. No pancreatic ductal dilatation or surrounding inflammatory changes. Spleen: Normal in size without focal abnormality. Adrenals/Urinary Tract: Both adrenal glands appear normal. No evidence of urinary tract calculus, suspicious renal lesion or hydronephrosis. Stable renal atrophy and cortical scarring on the right. Stable renal vascular calcifications bilaterally. The bladder appears unremarkable for its degree of distention. Stomach/Bowel: Small amount of enteric contrast in the stomach. The stomach appears unremarkable for its degree of distension. No evidence of bowel wall thickening, distention or surrounding inflammatory change. The appendix is tentatively identified and appears unremarkable. There is prominent stool throughout the colon. Mild diverticular changes throughout the descending and sigmoid colon. Vascular/Lymphatic: There are no enlarged abdominal or pelvic lymph nodes. Extensive aortic and branch vessel atherosclerosis again noted without evidence of aneurysm. Reproductive: The prostate gland and seminal vesicles appear unremarkable. Other: No significant change in recently demonstrated multifocal fluid collections along the course of the right iliopsoas musculature. A superior component along the right posterolateral abdominal wall measures 4.3 x 3.0 cm on image 41/3. Ill-defined component associated with the right iliacus muscle measures approximately 7.8 x 4.3 cm on image 56/3. Distal component anterior to  the right femoral head measures approximately 4.4 x 2.4 cm on image 82/3 and may be slightly larger. These collections do not demonstrate any high-density components or air. No ascites or pneumoperitoneum. No abdominal wall hernia. Musculoskeletal: No evidence of acute fracture, dislocation or osteomyelitis. Degenerative changes of the sacroiliac joints and mild multilevel lumbar spondylosis. Subcutaneous edema in both flanks has mildly increased. There is asymmetric extension of subcutaneous edema into the proximal right thigh. IMPRESSION: 1. No significant change in recently demonstrated multifocal fluid collections  along the course of the right iliopsoas musculature. The distal component anterior to the right femoral head may be slightly larger. These collections do not demonstrate any high-density components or air and could be secondary to subacute or chronic hematoma versus bursitis. In the setting of sepsis, aspiration should be considered to exclude infection. 2. No evidence of osteomyelitis or discitis. 3. Unchanged moderate right and small left pleural effusions with associated compressive atelectasis at both lung bases. 4. Mildly increased subcutaneous edema in both flanks with asymmetric extension into the proximal right thigh. 5. Incompletely visualized, but grossly stable right middle lobe nodule. See follow up recommendations from previous chest CT of 03/26/2023. 6. Aortic Atherosclerosis (ICD10-I70.0) and Emphysema (ICD10-J43.9). Electronically Signed   By: Carey Bullocks M.D.   On: 04/03/2023 16:02    Scheduled Meds:  amiodarone  200 mg Oral Daily   apixaban  5 mg Oral BID   calamine  1 Application Topical TID   Chlorhexidine Gluconate Cloth  6 each Topical Daily   furosemide  40 mg Intravenous BID   hydrocortisone cream   Topical TID   insulin aspart  0-5 Units Subcutaneous QHS   insulin aspart  0-6 Units Subcutaneous TID WC   methylPREDNISolone (SOLU-MEDROL) injection  40 mg  Intravenous Q12H   midodrine  10 mg Oral TID WC   polyethylene glycol  17 g Oral Daily   senna-docusate  1 tablet Oral BID   sodium bicarbonate  650 mg Oral BID   sodium chloride flush  10-40 mL Intracatheter Q12H   sodium chloride flush  3 mL Intravenous Q12H   Continuous Infusions:  milrinone 0.25 mcg/kg/min (04/04/23 1000)   piperacillin-tazobactam (ZOSYN)  IV 3.375 g (04/04/23 1005)      LOS: 9 days   Burnadette Pop, MD [facility] P12/08/2022, 11:20 AM

## 2023-04-04 NOTE — Progress Notes (Signed)
Physical Therapy Treatment Patient Details Name: [name] [name] MRN: [id number] DOB: [birth year] Today's Date: 04/04/2023   History of Present Illness 77 y.o. male presents to [facility] hospital on 03/25/2023 with progressively worsening R hip pain. Pt found to have retroperitoneal hematoma extending into the distal R psoas and R iliacus. Started on antibiotics for possible PNA. Negative [place] for R LE DVT. Pt was recently admitted 11/5-11/8 after a fall and R distal clavicle fx. PMH includes AICD, bladder CA, CHF, CAD, DMII, PAF, PNA. 11/5 right distal clavicle fracture that was managed nonoperatively.    PT Comments  Pt received in bed, worked on coming out of bed to L which is difficult for pt due to RLE weakness, min A needed. Pt needed increased time in sitting EOB with gain balance. Min A needed for sit>stand from bed and recliner. Pt ambulated 30' with min A, then took seated rest break and ambulated 30' more. First bout off O2 with SPO2 dropped to 87%, second bout on 2L with sats remaining in low 90's and pt reported feeling better. Pt with occasional buckling of RLE with ambulation, needed AD to compensate for this. Patient will benefit from intensive inpatient follow up therapy, >3 hours/day. PT will continue to follow.     If plan is discharge home, recommend the following: Assist for transportation;Help with stairs or ramp for entrance;A lot of help with walking and/or transfers;Assistance with cooking/housework   Can travel by private vehicle        Equipment Recommendations  Other (comment) (defer to post acute)    Recommendations for Other Services Rehab consult     Precautions / Restrictions Precautions Precautions: Fall Precaution Comments: watch BP and HR Restrictions Weight Bearing Restrictions: No RUE Weight Bearing: Weight bearing as tolerated Other Position/Activity Restrictions: RUE WBAT allowed per Dr Renford Dills 12/2 10:47 AM     Mobility  Bed Mobility Overal bed  mobility: Needs Assistance Bed Mobility: Supine to Sit     Supine to sit: Min assist     General bed mobility comments: min A for RLE to come to L side of bed. vc's for rolling to L and grasping L rail with R hand. Pt needed increased time and took great effort but was able to come to upright without physical assist. One post LOB in sitting with initial ascent, min A to correct    Transfers Overall transfer level: Needs assistance Equipment used: Rolling walker (2 wheels) Transfers: Sit to/from Stand Sit to Stand: Min assist, +2 safety/equipment           General transfer comment: min A from elevated bed and from low recliner. vc's for hand placement    Ambulation/Gait Ambulation/Gait assistance: Min assist, +2 safety/equipment Gait Distance (Feet): 60 Feet (30' x2) Assistive device: Rolling walker (2 wheels) Gait Pattern/deviations: Step-to pattern, Decreased stride length, Shuffle Gait velocity: decreased Gait velocity interpretation: <1.31 ft/sec, indicative of household ambulator   General Gait Details: pt needed seated rest break at 20'. Ambulated without O2 first 20' with SPO2 dropping to 87%. Ambulated back on 2L with sats remaining low 90's. Pt with occasional buckle of RLE, min A for safety, +2 for chair behind   Stairs             Wheelchair Mobility     Tilt Bed    Modified Rankin (Stroke Patients Only)       Balance Overall balance assessment: Needs assistance, History of Falls Sitting-balance support: Feet supported, Bilateral upper extremity supported Sitting  balance-Leahy Scale: Poor Sitting balance - Comments: has difficulty maintaining balance with scooting fwd   Standing balance support: Reliant on assistive device for balance, During functional activity, Bilateral upper extremity supported Standing balance-Leahy Scale: Poor Standing balance comment: chair follow, +2 reliant on external support and AD.                             Cognition Arousal: Alert Behavior During Therapy: WFL for tasks assessed/performed, Anxious Overall Cognitive Status: Within Functional Limits for tasks assessed Area of Impairment: Safety/judgement, Problem solving                             Problem Solving: Requires verbal cues, Difficulty sequencing General Comments: mildly jittery and impulsive, cues needed to take his time.        Exercises Other Exercises Other Exercises: standing B hip ext pushing hips fwd x10    General Comments General comments (skin integrity, edema, etc.): HR 70 bpm, BP 158/61 after session      Pertinent Vitals/Pain Pain Assessment Pain Assessment: Faces Faces Pain Scale: Hurts little more Pain Location: R hip Pain Descriptors / Indicators: Tender, Discomfort, Grimacing, Guarding Pain Intervention(s): Monitored during session    Home Living                          Prior Function            PT Goals (current goals can now be found in the care plan section) Acute Rehab PT Goals Patient Stated Goal: to get better PT Goal Formulation: With patient Time For Goal Achievement: 04/10/23 Potential to Achieve Goals: Good Progress towards PT goals: Progressing toward goals    Frequency    Min 1X/week      PT Plan      Co-evaluation              AM-PAC PT "6 Clicks" Mobility   Outcome Measure  Help needed turning from your back to your side while in a flat bed without using bedrails?: A Little Help needed moving from lying on your back to sitting on the side of a flat bed without using bedrails?: A Lot Help needed moving to and from a bed to a chair (including a wheelchair)?: A Lot Help needed standing up from a chair using your arms (e.g., wheelchair or bedside chair)?: A Lot Help needed to walk in hospital room?: A Lot Help needed climbing 3-5 steps with a railing? : A Lot 6 Click Score: 13    End of Session Equipment Utilized During Treatment:  Gait belt Activity Tolerance: Patient limited by fatigue Patient left: with call bell/phone within reach;in chair;with chair alarm set;with family/visitor present Nurse Communication: Mobility status PT Visit Diagnosis: Unsteadiness on feet (R26.81);Repeated falls (R29.6);Other abnormalities of gait and mobility (R26.89)     Time: 7829-5621 PT Time Calculation (min) (ACUTE ONLY): 24 min  Charges:    $Gait Training: 23-37 mins PT General Charges $$ ACUTE PT VISIT: 1 Visit                     Lyanne Co, PT  Acute Rehab Services Secure chat preferred Office [phone]    Lawana Chambers Arn Mcomber 04/04/2023, 12:02 PM

## 2023-04-04 NOTE — Progress Notes (Signed)
Mobility Specialist Progress Note:   04/04/23 1300  Mobility  Activity Transferred from chair to bed  Level of Assistance +2 (takes two people)  Press photographer wheel walker  Distance Ambulated (ft) 6 ft  RUE Weight Bearing WBAT  Activity Response Tolerated well  Mobility Referral Yes  Mobility visit 1 Mobility  Mobility Specialist Start Time (ACUTE ONLY) 1326  Mobility Specialist Stop Time (ACUTE ONLY) 1332  Mobility Specialist Time Calculation (min) (ACUTE ONLY) 6 min    Pt received in chair, requesting assistance back to bed. Able to stand w/ minA+2 and take steps to bed. No complaints throughout. Pt left in bed with call bell and NT present.  D'Vante Earlene Plater Mobility Specialist Please contact via Special educational needs teacher or Rehab office at [phone]

## 2023-04-04 NOTE — Plan of Care (Signed)
  Problem: Education: Goal: Knowledge of General Education information will improve Description: Including pain rating scale, medication(s)/side effects and non-pharmacologic comfort measures Outcome: Progressing   Problem: Health Behavior/Discharge Planning: Goal: Ability to manage health-related needs will improve Outcome: Progressing   Problem: Clinical Measurements: Goal: Ability to maintain clinical measurements within normal limits will improve Outcome: Progressing Goal: Will remain free from infection Outcome: Progressing Goal: Diagnostic test results will improve Outcome: Progressing Goal: Respiratory complications will improve Outcome: Progressing Goal: Cardiovascular complication will be avoided Outcome: Progressing   Problem: Nutrition: Goal: Adequate nutrition will be maintained Outcome: Progressing   Problem: Coping: Goal: Level of anxiety will decrease Outcome: Progressing   Problem: Elimination: Goal: Will not experience complications related to bowel motility Outcome: Progressing Goal: Will not experience complications related to urinary retention Outcome: Progressing   Problem: Pain Management: Goal: General experience of comfort will improve Outcome: Progressing   Problem: Safety: Goal: Ability to remain free from injury will improve Outcome: Progressing   Problem: Skin Integrity: Goal: Risk for impaired skin integrity will decrease Outcome: Progressing   Problem: Clinical Measurements: Goal: Ability to maintain a body temperature in the normal range will improve Outcome: Progressing   Problem: Respiratory: Goal: Ability to maintain adequate ventilation will improve Outcome: Progressing Goal: Ability to maintain a clear airway will improve Outcome: Progressing   Problem: Health Behavior/Discharge Planning: Goal: Ability to identify and utilize available resources and services will improve Outcome: Progressing Goal: Ability to manage  health-related needs will improve Outcome: Progressing   Problem: Metabolic: Goal: Ability to maintain appropriate glucose levels will improve Outcome: Progressing   Problem: Nutritional: Goal: Maintenance of adequate nutrition will improve Outcome: Progressing Goal: Progress toward achieving an optimal weight will improve Outcome: Progressing   Problem: Skin Integrity: Goal: Risk for impaired skin integrity will decrease Outcome: Progressing   Problem: Tissue Perfusion: Goal: Adequacy of tissue perfusion will improve Outcome: Progressing

## 2023-04-04 NOTE — Progress Notes (Signed)
Rounding Note    Patient Name: [name] [name] Date of Encounter: 04/04/2023  [facility] Cardiologist: Arvilla Meres, MD   Subjective   Rash is less pruritic today. Reviewed CT results, he reports having a BM yesterday. No pain, breathing stable, urine output stable.  Inpatient Medications    Scheduled Meds:  amiodarone  200 mg Oral Daily   apixaban  5 mg Oral BID   calamine  1 Application Topical TID   Chlorhexidine Gluconate Cloth  6 each Topical Daily   furosemide  40 mg Intravenous BID   hydrocortisone cream   Topical TID   insulin aspart  0-5 Units Subcutaneous QHS   insulin aspart  0-6 Units Subcutaneous TID WC   methylPREDNISolone (SOLU-MEDROL) injection  40 mg Intravenous Q12H   midodrine  10 mg Oral TID WC   polyethylene glycol  17 g Oral Daily   senna-docusate  1 tablet Oral BID   sodium bicarbonate  650 mg Oral BID   sodium chloride flush  10-40 mL Intracatheter Q12H   sodium chloride flush  3 mL Intravenous Q12H   Continuous Infusions:  milrinone 0.25 mcg/kg/min (04/04/23 1000)   piperacillin-tazobactam (ZOSYN)  IV 3.375 g (04/04/23 1005)   PRN Meds: albuterol, benzonatate, HYDROmorphone (DILAUDID) injection, hydrOXYzine, magic mouthwash, melatonin, ondansetron **OR** ondansetron (ZOFRAN) IV, oxyCODONE, sodium chloride flush, sodium chloride flush   Vital Signs    Vitals:   04/03/23 2313 04/03/23 2314 04/04/23 0505 04/04/23 0839  BP: (!) 116/47  (!) 114/44 136/71  Pulse:   61 66  Resp: 14  12 17   Temp: 98 F (36.7 C) 98 F (36.7 C) 97.8 F (36.6 C)   TempSrc: Oral  Oral Oral  SpO2: 96%  92% 93%  Weight:   91.6 kg   Height:        Intake/Output Summary (Last 24 hours) at 04/04/2023 1040 Last data filed at 04/04/2023 1000 Gross per 24 hour  Intake 399.08 ml  Output 750 ml  Net -350.92 ml      04/04/2023    5:05 AM 04/03/2023    5:26 AM 04/02/2023    4:29 AM  Last 3 Weights  Weight (lbs) 201 lb 15.1 oz 202 lb 9.6 oz 205 lb 7.5  oz  Weight (kg) 91.6 kg 91.9 kg 93.2 kg      Telemetry    SR - Personally Reviewed  Physical Exam   GEN: Well nourished, well developed in no acute distress. O2 by nasal cannula NECK: JVD at clavicle at 45 degrees CARDIAC: regular rhythm, normal S1 and S2, no rubs or gallops. 2/6 systolic murmur. VASCULAR: Radial pulses 2+ bilaterally.  RESPIRATORY:  Diffusely coarse, diminished at bilateral bases  ABDOMEN: Soft, non-tender, non-distended MUSCULOSKELETAL:  Moves all 4 limbs independently SKIN: Warm and dry, no LLE edema, improving RLE edema NEUROLOGIC:  No focal neuro deficits noted. PSYCHIATRIC:  Normal affect   Skin: diffuse erythematous papules across body, most highly concentrated in area between lateral ribs/along medial upper arms, across back, and in folds of skin. Less erythematous and raised compared to yesterday  New pertinent results (labs, ECG, imaging, cardiac studies)    CT from 12/4 personally reviewed--agree with report  Patient Profile     77 y.o. male with hx of COPD, type 2 diabetes, coronary artery disease status post CABG in 2018 with stage D systolic heart failure status post CCM, hypertension, history of bladder cancer and paroxysmal atrial fibrillation, admitted 11/26 with large retroperitoneal hematoma (home Eliquis  now held) and shortness of breath. Treated for CAP, started on milrinone this admission for cardiorenal syndrome  Assessment & Plan    Acute on chronic systolic and diastolic heart failure Ischemic cardiomyopathy Hypotension AKI on CKD stage 3b Cardiorenal syndrome -EF 20-25% -nephrology following, no acute indications for HD -no sglt2i due to yeast infections -hyperkalemia/gynecomastia on spironolactone -on midodrine, no BP room for GDMT -Cr today 2.23, down from 3.43 12/2, baseline ~2, minimal urine output with lasix prior to milrinone -started milrinone 12/2 with improvement in urine output and Cr.  Made ~1000 cc urine yesterday.  Initial coox 64, was 85 today (see below, there is some concern for infection, may be part of rise in coox) -blood pressure stable on milrinone, continue midodrine for now. Blood pressures significantly improving on milrinone, may be able to cut back on midodrine if this continues to improve. Will aim for as much BP as we can get right now to facilitate diuresis. -admission weight 89.5 kg, peak weight 94.9 kg 12/2, weight today 91.6 kg -has not had significant ectopy on milrinone   RLE edema -negative for DVT -may be in the setting of venous/lymphatic compression from RP hematoma given right sided location of hematoma (with notable iliopsoas and iliacus intramuscular hemorrhage) -continues to improve   Atrial flutter RP hematoma -DCCV 11/7, but anticoagulation on hold given RP bleed -has remained in sinus throughout admission -hgb dropped from 10.5 to 9.0 to 8.7 today. Monitor, denies new pain.  -re-imaged with CT 12/4, fluid collections largely unchanged, possibly slightly larger size of collection anterior to right femoral head, but does not suggest substantial new bleeding to explain Hgb drop   CAD History of 2V CABG 2018 -atorvastatin on hold given elevated LFTs, which are gradually improving, likely 2/2 congestive hepatopathy. Restart statin once LFTs normalized. -no aspirin long term as he is on DOAC (which is currently on hold, as above)  Rash Concern for infection -my concern was for drug eruption from milrinone, but his wife notes that he started getting spots on his back even prior to milrinone, and this has been progressive.  -has been started on hydroxyzine and topical hydrocortisone as well as solumedrol -WBC continued to rise, procalcitonin mid range -completed ceftriaxone/azithromycin for CAP earlier this admission, started on zosyn yesterday -per primary team  Severe deconditioning -cannot even sit up in bed without assistance. Working with PT. Agree with rehab post  discharge.  Prominent stool on CT -on miralax daily, last stool 11/29    Signed, Jodelle Red, MD  04/04/2023, 10:40 AM

## 2023-04-04 NOTE — Progress Notes (Signed)
IP rehab admissions - continuing to follow.  Noted milrinone drip d/t volume overload.  Not medically ready for inpatient rehab.  Call for questions.  [phone]

## 2023-04-04 NOTE — Inpatient Diabetes Management (Signed)
Inpatient Diabetes Program Recommendations  AACE/ADA: New Consensus Statement on Inpatient Glycemic Control (2015)  Target Ranges:  Prepandial:   less than 140 mg/dL      Peak postprandial:   less than 180 mg/dL (1-2 hours)      Critically ill patients:  140 - 180 mg/dL   Lab Results  Component Value Date   GLUCAP 236 (H) 04/04/2023   HGBA1C 6.9 (H) 03/05/2023    Review of Glycemic Control  Latest Reference Range & Units 04/02/23 21:33 04/03/23 05:25 04/03/23 11:25 04/03/23 16:27 04/03/23 20:46 04/04/23 05:46  Glucose-Capillary 70 - 99 mg/dL 147 (H) 829 (H) 562 (H) 187 (H) 233 (H) 236 (H)   Diabetes history: DM 2 Outpatient Diabetes medications:  None Current orders for Inpatient glycemic control:  Novolog 0-6 units and HS Solumedrol 40 mg IV q 12 hours Inpatient Diabetes Program Recommendations:  Consider increasing Novolog correction to sensitive (0-9 units) tid with meals and consider adding Semglee 8 units daily - while on steroids.   Thanks,   Beryl Meager, RN, BC-ADM Inpatient Diabetes Coordinator Pager [phone]  (8a-5p)

## 2023-04-04 NOTE — Progress Notes (Signed)
Patient ID: [name] [name], male   DOB: 1946/02/25, 77 y.o.   MRN: [id number]  [facility] Progress Note   Assessment/ Plan:   1. Acute kidney Injury on chronic kidney disease stage IIIb (baseline creatinine around 2): Underlying chronic kidney disease suspected to be cardiorenal with acute injury likely from ischemic ATN in the setting of hypotension with ongoing ARB use (Entresto).  No evidence of obstruction associated with retroperitoneal hematoma.  Renal function continues to improve (albeit slowly) with fixed urine output on milrinone and furosemide. 2.  Fall/right retroperitoneal hematoma: Developed traumatic multilocular acute versus subacute retroperitoneal hematoma with intramuscular hemorrhage within the right iliacus muscle.  Apixaban transiently held and has been restarted yesterday. 3.  Acute exacerbation of chronic systolic heart failure: Torsemide and Entresto originally held in the setting of acute kidney injury and currently on milrinone/furosemide for inotropic support with ongoing diuresis. 4.  Anemia: Mild and secondary to underlying chronic kidney disease/chronic illness with exacerbation due to retroperitoneal hemorrhage.  No indication for PRBC transfusion. 5.  Atrial fibrillation: Currently in sinus rhythm that is rate controlled while on amiodarone. 6.  Skin rash: Currently treating as an allergic skin reaction/pruritus with topical/systemic management.  Subjective:   Reports symptomatic improvement of his rash with topical hydrocortisone/calamine and hydroxyzine along with Solu-Medrol.   Objective:   BP (!) 114/44 (BP Location: Left Arm)   Pulse 61   Temp 97.8 F (36.6 C) (Oral)   Resp 12   Ht 6\' 2"  (1.88 m)   Wt 91.6 kg   SpO2 92%   BMI 25.93 kg/m   Intake/Output Summary (Last 24 hours) at 04/04/2023 0838 Last data filed at 04/04/2023 0700 Gross per 24 hour  Intake 403.53 ml  Output 1050 ml  Net -646.47 ml   Weight change: -0.3 kg  Physical  Exam: Gen: Resting comfortably in bed, wife at bedside.  Areas of splotchy rash over extremities appear to be reduced CVS: Pulse regular rhythm, normal rate, S1 and S2 normal Resp: Diminished breath sounds over bases due to poor inspiratory effort, no rales/rhonchi Abd: Soft, flat, nontender, bowel sounds normal Ext: 1-2+ right lower extremity edema, trace left lower extremity edema  Imaging: CT ABDOMEN PELVIS WO CONTRAST  Result Date: 04/03/2023 CLINICAL DATA:  Sepsis.  Hip pain. EXAM: CT ABDOMEN AND PELVIS WITHOUT CONTRAST TECHNIQUE: Multidetector CT imaging of the abdomen and pelvis was performed following the standard protocol without IV contrast. RADIATION DOSE REDUCTION: This exam was performed according to the departmental dose-optimization program which includes automated exposure control, adjustment of the mA and/or kV according to patient size and/or use of iterative reconstruction technique. COMPARISON:  Abdominopelvic CT 03/26/2023.  Pelvic CT 04/16/2019. FINDINGS: Lower chest: Cardiac pacemaker leads extend into the right atrium and right ventricle. There is aortic and coronary artery atherosclerosis. Unchanged moderate right and small left pleural effusions with associated compressive atelectasis at both lung bases. The recently demonstrated right middle lobe nodule is incompletely visualized, although grossly stable. No new nodularity identified at the visualized lung bases. There is underlying mild to moderate centrilobular emphysema. Hepatobiliary: The liver appears stable, without focal abnormality on noncontrast imaging. No evidence of significant biliary dilatation status post cholecystectomy. Pancreas: Unremarkable. No pancreatic ductal dilatation or surrounding inflammatory changes. Spleen: Normal in size without focal abnormality. Adrenals/Urinary Tract: Both adrenal glands appear normal. No evidence of urinary tract calculus, suspicious renal lesion or hydronephrosis. Stable renal  atrophy and cortical scarring on the right. Stable renal vascular calcifications bilaterally. The bladder appears  unremarkable for its degree of distention. Stomach/Bowel: Small amount of enteric contrast in the stomach. The stomach appears unremarkable for its degree of distension. No evidence of bowel wall thickening, distention or surrounding inflammatory change. The appendix is tentatively identified and appears unremarkable. There is prominent stool throughout the colon. Mild diverticular changes throughout the descending and sigmoid colon. Vascular/Lymphatic: There are no enlarged abdominal or pelvic lymph nodes. Extensive aortic and branch vessel atherosclerosis again noted without evidence of aneurysm. Reproductive: The prostate gland and seminal vesicles appear unremarkable. Other: No significant change in recently demonstrated multifocal fluid collections along the course of the right iliopsoas musculature. A superior component along the right posterolateral abdominal wall measures 4.3 x 3.0 cm on image 41/3. Ill-defined component associated with the right iliacus muscle measures approximately 7.8 x 4.3 cm on image 56/3. Distal component anterior to the right femoral head measures approximately 4.4 x 2.4 cm on image 82/3 and may be slightly larger. These collections do not demonstrate any high-density components or air. No ascites or pneumoperitoneum. No abdominal wall hernia. Musculoskeletal: No evidence of acute fracture, dislocation or osteomyelitis. Degenerative changes of the sacroiliac joints and mild multilevel lumbar spondylosis. Subcutaneous edema in both flanks has mildly increased. There is asymmetric extension of subcutaneous edema into the proximal right thigh. IMPRESSION: 1. No significant change in recently demonstrated multifocal fluid collections along the course of the right iliopsoas musculature. The distal component anterior to the right femoral head may be slightly larger. These  collections do not demonstrate any high-density components or air and could be secondary to subacute or chronic hematoma versus bursitis. In the setting of sepsis, aspiration should be considered to exclude infection. 2. No evidence of osteomyelitis or discitis. 3. Unchanged moderate right and small left pleural effusions with associated compressive atelectasis at both lung bases. 4. Mildly increased subcutaneous edema in both flanks with asymmetric extension into the proximal right thigh. 5. Incompletely visualized, but grossly stable right middle lobe nodule. See follow up recommendations from previous chest CT of 03/26/2023. 6. Aortic Atherosclerosis (ICD10-I70.0) and Emphysema (ICD10-J43.9). Electronically Signed   By: Carey Bullocks M.D.   On: 04/03/2023 16:02    Labs: BMET Recent Labs  Lab 03/29/23 0523 03/30/23 0233 03/31/23 0823 04/02/23 0214 04/03/23 0544 04/04/23 0600  NA 131* 132* 130* 131* 131* 132*  K 4.6 4.9 4.0 4.1 3.4* 4.2  CL 103 104 101 102 100 100  CO2 19* 20* 18* 20* 21* 25  GLUCOSE 118* 119* 116* 196* 138* 250*  BUN 55* 63* 67* 65* 65* 63*  CREATININE 3.26* 3.29* 3.43* 2.58* 2.35* 2.23*  CALCIUM 8.2* 8.2* 8.3* 8.1* 8.0* 7.9*   CBC Recent Labs  Lab 04/01/23 1230 04/02/23 0214 04/03/23 0544 04/04/23 0600  WBC 17.2* 21.7* 27.8* 18.8*  HGB 9.9* 10.5* 9.0* 8.7*  HCT 30.2* 32.3* 27.6* 27.4*  MCV 83.0 81.6 80.9 81.1  PLT 742* 786* 803* 783*    Medications:     amiodarone  200 mg Oral Daily   apixaban  5 mg Oral BID   calamine  1 Application Topical TID   Chlorhexidine Gluconate Cloth  6 each Topical Daily   furosemide  40 mg Intravenous BID   hydrocortisone cream   Topical TID   insulin aspart  0-5 Units Subcutaneous QHS   insulin aspart  0-6 Units Subcutaneous TID WC   methylPREDNISolone (SOLU-MEDROL) injection  40 mg Intravenous Q12H   midodrine  10 mg Oral TID WC   polyethylene glycol  17 g  Oral Daily   potassium chloride  20 mEq Oral BID    senna-docusate  1 tablet Oral BID   sodium bicarbonate  650 mg Oral BID   sodium chloride flush  10-40 mL Intracatheter Q12H   sodium chloride flush  3 mL Intravenous Q12H   Zetta Bills, MD 04/04/2023, 8:38 AM

## 2023-04-05 DIAGNOSIS — I5043 Acute on chronic combined systolic (congestive) and diastolic (congestive) heart failure: Secondary | ICD-10-CM | POA: Diagnosis not present

## 2023-04-05 DIAGNOSIS — I509 Heart failure, unspecified: Secondary | ICD-10-CM | POA: Diagnosis not present

## 2023-04-05 DIAGNOSIS — N1832 Chronic kidney disease, stage 3b: Secondary | ICD-10-CM | POA: Diagnosis not present

## 2023-04-05 DIAGNOSIS — K683 Retroperitoneal hematoma: Secondary | ICD-10-CM | POA: Diagnosis not present

## 2023-04-05 LAB — COMPREHENSIVE METABOLIC PANEL
ALT: 123 U/L — ABNORMAL HIGH (ref 0–44)
AST: 130 U/L — ABNORMAL HIGH (ref 15–41)
Albumin: 1.8 g/dL — ABNORMAL LOW (ref 3.5–5.0)
Alkaline Phosphatase: 103 U/L (ref 38–126)
Anion gap: 12 (ref 5–15)
BUN: 60 mg/dL — ABNORMAL HIGH (ref 8–23)
CO2: 20 mmol/L — ABNORMAL LOW (ref 22–32)
Calcium: 7.8 mg/dL — ABNORMAL LOW (ref 8.9–10.3)
Chloride: 98 mmol/L (ref 98–111)
Creatinine, Ser: 2.4 mg/dL — ABNORMAL HIGH (ref 0.61–1.24)
GFR, Estimated: 27 mL/min — ABNORMAL LOW (ref >60)
Glucose, Bld: 287 mg/dL — ABNORMAL HIGH (ref 70–99)
Potassium: 3.9 mmol/L (ref 3.5–5.1)
Sodium: 130 mmol/L — ABNORMAL LOW (ref 135–145)
Total Bilirubin: 0.6 mg/dL (ref <1.2)
Total Protein: 4.8 g/dL — ABNORMAL LOW (ref 6.5–8.1)

## 2023-04-05 LAB — CBC
HCT: 26.2 % — ABNORMAL LOW (ref 39.0–52.0)
Hemoglobin: 8.5 g/dL — ABNORMAL LOW (ref 13.0–17.0)
MCH: 26 pg (ref 26.0–34.0)
MCHC: 32.4 g/dL (ref 30.0–36.0)
MCV: 80.1 fL (ref 80.0–100.0)
Platelets: 711 10*3/uL — ABNORMAL HIGH (ref 150–400)
RBC: 3.27 MIL/uL — ABNORMAL LOW (ref 4.22–5.81)
RDW: 17.6 % — ABNORMAL HIGH (ref 11.5–15.5)
WBC: 21.1 10*3/uL — ABNORMAL HIGH (ref 4.0–10.5)
nRBC: 0 % (ref 0.0–0.2)

## 2023-04-05 LAB — GLUCOSE, CAPILLARY
Glucose-Capillary: 244 mg/dL — ABNORMAL HIGH (ref 70–99)
Glucose-Capillary: 292 mg/dL — ABNORMAL HIGH (ref 70–99)
Glucose-Capillary: 215 mg/dL — ABNORMAL HIGH (ref 70–99)
Glucose-Capillary: 248 mg/dL — ABNORMAL HIGH (ref 70–99)

## 2023-04-05 LAB — MAGNESIUM: Magnesium: 2 mg/dL (ref 1.7–2.4)

## 2023-04-05 LAB — COOXEMETRY PANEL
Carboxyhemoglobin: 2.1 % — ABNORMAL HIGH (ref 0.5–1.5)
Methemoglobin: <0.7 % (ref 0.0–1.5)
O2 Saturation: 79.4 %
Total hemoglobin: 8.9 g/dL — ABNORMAL LOW (ref 12.0–16.0)

## 2023-04-05 MED ORDER — BISACODYL 10 MG RE SUPP
10.0000 mg | Freq: Once | RECTAL | Status: AC
  Administered 2023-04-05: 10 mg via RECTAL
  Filled 2023-04-05: qty 1

## 2023-04-05 MED ORDER — PREDNISONE 20 MG PO TABS
40.0000 mg | ORAL_TABLET | Freq: Every day | ORAL | Status: AC
  Administered 2023-04-06: 40 mg via ORAL
  Filled 2023-04-05: qty 2

## 2023-04-05 MED ORDER — MILRINONE LACTATE IN DEXTROSE 20-5 MG/100ML-% IV SOLN
0.1250 ug/kg/min | INTRAVENOUS | Status: DC
Start: 2023-04-05 — End: 2023-04-07
  Administered 2023-04-05 – 2023-04-07 (×3): 0.125 ug/kg/min via INTRAVENOUS
  Filled 2023-04-05 (×2): qty 100

## 2023-04-05 MED ORDER — PREDNISONE 20 MG PO TABS: 20.0000 mg | ORAL_TABLET | Freq: Every day | ORAL | Status: DC

## 2023-04-05 MED ORDER — MILRINONE LACTATE IN DEXTROSE 20-5 MG/100ML-% IV SOLN: 0.1250 ug/kg/min | INTRAVENOUS | Status: DC

## 2023-04-05 MED ORDER — PREDNISONE 10 MG PO TABS: 10.0000 mg | ORAL_TABLET | Freq: Every day | ORAL | Status: DC

## 2023-04-05 MED ORDER — AMOXICILLIN-POT CLAVULANATE 500-125 MG PO TABS
1.0000 | ORAL_TABLET | Freq: Two times a day (BID) | ORAL | Status: DC
  Administered 2023-04-05 – 2023-04-08 (×7): 1 via ORAL
  Filled 2023-04-05 (×8): qty 1

## 2023-04-05 MED ORDER — BISACODYL 10 MG RE SUPP
10.0000 mg | Freq: Once | RECTAL | Status: DC
  Filled 2023-04-05: qty 1

## 2023-04-05 NOTE — Progress Notes (Signed)
Patient ID: [name] [name], male   DOB: 04-05-1946, 77 y.o.   MRN: [id number] [facility] Progress Note   Assessment/ Plan:   1. Acute kidney Injury on chronic kidney disease stage IIIb (baseline creatinine around 2): Underlying chronic kidney disease suspected to be cardiorenal with acute injury likely from ischemic ATN in the setting of hypotension with ongoing ARB use (Entresto).  No evidence of obstruction associated with retroperitoneal hematoma.  Renal function slightly lower this morning with urine output appearing to daily/decreased.  It is possible that he is euvolemic and may be dipping into intravascular volume contraction.  Will transition from intravenous furosemide to oral torsemide when off milrinone. 2.  Fall/right retroperitoneal hematoma: Developed traumatic multilocular acute versus subacute retroperitoneal hematoma with intramuscular hemorrhage within the right iliacus muscle.  Apixaban transiently held and has been restarted yesterday. 3.  Acute exacerbation of chronic systolic heart failure: Torsemide and Entresto originally held in the setting of acute kidney injury and with good diuresis/improving renal function following initiation of milrinone. 4.  Anemia: Mild and secondary to underlying chronic kidney disease/chronic illness with exacerbation due to retroperitoneal hemorrhage.  No indication for PRBC transfusion. 5.  Atrial fibrillation: Currently in sinus rhythm that is rate controlled while on amiodarone. 6.  Skin rash: Currently treating as an allergic skin reaction/pruritus with topical/systemic management.  Subjective:   He is no longer itchy and remarks that rash is reduced in intensity.   Objective:   BP 124/63 (BP Location: Left Arm)   Pulse 98   Temp 98.4 F (36.9 C) (Oral)   Resp 18   Ht 6\' 2"  (1.88 m)   Wt 90 kg   SpO2 91%   BMI 25.47 kg/m   Intake/Output Summary (Last 24 hours) at 04/05/2023 0840 Last data filed at 04/05/2023  0700 Gross per 24 hour  Intake 549.98 ml  Output 525 ml  Net 24.98 ml   Weight change: -1.6 kg  Physical Exam: Gen: Thin ready to work with physical therapy CVS: Pulse regular rhythm, normal rate, S1 and S2 normal Resp: Distant breath sounds bilaterally, no distinct rales or rhonchi Abd: Soft, flat, nontender, bowel sounds normal Ext: 1+ right lower extremity edema, no left lower extremity edema  Imaging: CT ABDOMEN PELVIS WO CONTRAST  Result Date: 04/03/2023 CLINICAL DATA:  Sepsis.  Hip pain. EXAM: CT ABDOMEN AND PELVIS WITHOUT CONTRAST TECHNIQUE: Multidetector CT imaging of the abdomen and pelvis was performed following the standard protocol without IV contrast. RADIATION DOSE REDUCTION: This exam was performed according to the departmental dose-optimization program which includes automated exposure control, adjustment of the mA and/or kV according to patient size and/or use of iterative reconstruction technique. COMPARISON:  Abdominopelvic CT 03/26/2023.  Pelvic CT 04/16/2019. FINDINGS: Lower chest: Cardiac pacemaker leads extend into the right atrium and right ventricle. There is aortic and coronary artery atherosclerosis. Unchanged moderate right and small left pleural effusions with associated compressive atelectasis at both lung bases. The recently demonstrated right middle lobe nodule is incompletely visualized, although grossly stable. No new nodularity identified at the visualized lung bases. There is underlying mild to moderate centrilobular emphysema. Hepatobiliary: The liver appears stable, without focal abnormality on noncontrast imaging. No evidence of significant biliary dilatation status post cholecystectomy. Pancreas: Unremarkable. No pancreatic ductal dilatation or surrounding inflammatory changes. Spleen: Normal in size without focal abnormality. Adrenals/Urinary Tract: Both adrenal glands appear normal. No evidence of urinary tract calculus, suspicious renal lesion or  hydronephrosis. Stable renal atrophy and cortical scarring on the  right. Stable renal vascular calcifications bilaterally. The bladder appears unremarkable for its degree of distention. Stomach/Bowel: Small amount of enteric contrast in the stomach. The stomach appears unremarkable for its degree of distension. No evidence of bowel wall thickening, distention or surrounding inflammatory change. The appendix is tentatively identified and appears unremarkable. There is prominent stool throughout the colon. Mild diverticular changes throughout the descending and sigmoid colon. Vascular/Lymphatic: There are no enlarged abdominal or pelvic lymph nodes. Extensive aortic and branch vessel atherosclerosis again noted without evidence of aneurysm. Reproductive: The prostate gland and seminal vesicles appear unremarkable. Other: No significant change in recently demonstrated multifocal fluid collections along the course of the right iliopsoas musculature. A superior component along the right posterolateral abdominal wall measures 4.3 x 3.0 cm on image 41/3. Ill-defined component associated with the right iliacus muscle measures approximately 7.8 x 4.3 cm on image 56/3. Distal component anterior to the right femoral head measures approximately 4.4 x 2.4 cm on image 82/3 and may be slightly larger. These collections do not demonstrate any high-density components or air. No ascites or pneumoperitoneum. No abdominal wall hernia. Musculoskeletal: No evidence of acute fracture, dislocation or osteomyelitis. Degenerative changes of the sacroiliac joints and mild multilevel lumbar spondylosis. Subcutaneous edema in both flanks has mildly increased. There is asymmetric extension of subcutaneous edema into the proximal right thigh. IMPRESSION: 1. No significant change in recently demonstrated multifocal fluid collections along the course of the right iliopsoas musculature. The distal component anterior to the right femoral head may be  slightly larger. These collections do not demonstrate any high-density components or air and could be secondary to subacute or chronic hematoma versus bursitis. In the setting of sepsis, aspiration should be considered to exclude infection. 2. No evidence of osteomyelitis or discitis. 3. Unchanged moderate right and small left pleural effusions with associated compressive atelectasis at both lung bases. 4. Mildly increased subcutaneous edema in both flanks with asymmetric extension into the proximal right thigh. 5. Incompletely visualized, but grossly stable right middle lobe nodule. See follow up recommendations from previous chest CT of 03/26/2023. 6. Aortic Atherosclerosis (ICD10-I70.0) and Emphysema (ICD10-J43.9). Electronically Signed   By: Carey Bullocks M.D.   On: 04/03/2023 16:02    Labs: BMET Recent Labs  Lab 03/30/23 0233 03/31/23 0823 04/02/23 0214 04/03/23 0544 04/04/23 0600 04/05/23 0500  NA 132* 130* 131* 131* 132* 130*  K 4.9 4.0 4.1 3.4* 4.2 3.9  CL 104 101 102 100 100 98  CO2 20* 18* 20* 21* 25 20*  GLUCOSE 119* 116* 196* 138* 250* 287*  BUN 63* 67* 65* 65* 63* 60*  CREATININE 3.29* 3.43* 2.58* 2.35* 2.23* 2.40*  CALCIUM 8.2* 8.3* 8.1* 8.0* 7.9* 7.8*   CBC Recent Labs  Lab 04/02/23 0214 04/03/23 0544 04/04/23 0600 04/05/23 0500  WBC 21.7* 27.8* 18.8* 21.1*  HGB 10.5* 9.0* 8.7* 8.5*  HCT 32.3* 27.6* 27.4* 26.2*  MCV 81.6 80.9 81.1 80.1  PLT 786* 803* 783* 711*    Medications:     amiodarone  200 mg Oral Daily   apixaban  5 mg Oral BID   calamine  1 Application Topical TID   Chlorhexidine Gluconate Cloth  6 each Topical Daily   furosemide  40 mg Intravenous BID   hydrocortisone cream   Topical TID   influenza vaccine adjuvanted  0.5 mL Intramuscular Tomorrow-1000   insulin aspart  0-15 Units Subcutaneous TID WC   insulin aspart  0-5 Units Subcutaneous QHS   insulin glargine-yfgn  8 Units  Subcutaneous Daily   methylPREDNISolone (SOLU-MEDROL) injection  40  mg Intravenous Q12H   midodrine  10 mg Oral TID WC   pneumococcal 20-valent conjugate vaccine  0.5 mL Intramuscular Tomorrow-1000   polyethylene glycol  17 g Oral Daily   senna-docusate  1 tablet Oral BID   sodium bicarbonate  650 mg Oral BID   sodium chloride flush  10-40 mL Intracatheter Q12H   sodium chloride flush  3 mL Intravenous Q12H   Zetta Bills, MD 04/05/2023, 8:40 AM

## 2023-04-05 NOTE — Progress Notes (Signed)
Pt refused to take the full dose of magic mouth wash. Pt educated that the Provider has ordered to help his symptoms. Pt still refused after education.

## 2023-04-05 NOTE — Progress Notes (Signed)
PROGRESS NOTE  [name] [name]  UJW:[id number] DOB: [birth year] DOA: 03/25/2023 PCP: [name], MD   Brief Narrative: Patient is a 77 year old male with history of hypertension, coronary artery disease, paroxysmal A-fib/flutter, chronic systolic CHF, status post pacemaker placement, peripheral vascular disease, diabetes type 2, bladder cancer, CKD stage IIIb who presented from home with complaint of hip pain after the fall.  He was also admitted on 03/05/2023 and was discharged on 11/24 after he presented with fall at home, found to have right distal clavicle fracture that was managed nonoperatively.  Patient reported worsening progressive right hip pain for last 2 days, unable to ambulate.  On condition he was hemodynamically stable.  Lab work showed WBC count of 14.2, creatinine of 1.9, hemoglobin of 10.9.  CT hip showed evidence of right retroperitoneal hematoma extending into the distal right psoas and right iliacus muscle.  CT chest also showed multifocal groundglass opacities bilaterally.  Started on antibiotics for possible PNA.  PT/OT consulted, recommendation is acute inpatient rehab. Cardiology following for acute on chronic systolic CHF.  Nephrology also following.  Due to severe systolic CHF and volume overload, started on milrinone on 12/2.  Assessment & Plan:  Principal Problem:   Retroperitoneal hematoma Active Problems:   Acute on chronic combined systolic and diastolic heart failure (HCC)   Stage 3b chronic kidney disease (HCC)   Low output heart failure (HCC)   AKI (acute kidney injury) (HCC)   Acute on Chronic systolic congestive heart failure:   Status post AICD.  Home torsemide,Entresto on hold. Last echo done on 11/7 showed EF of 20 to 25%.  Elevated BNP. Hospital course remarkable for persistent volume overload.  Started on IV Lasix and milrinone after PICC line placement  AKI on CKD stage IIIb: Baseline creatinine around 2.5.  Kidney function trended up to 3 . He  follows with [facility], nephrology also consulted, given need of persistent diuresis with IV medications and worsening kidney function. Currently on IV Lasix.  Creatinine trending down  Fall/retroperitoneal hematoma:CT right hip showed Retroperitoneal hematoma extending into the distal right psoas and right iliacus muscle. CT imaging also showed multilocular acute to subacute retroperitoneal hematoma with a component noted superiorly along the posterolateral abdominal wall measuring 4.4 x 2.6 cm. Intramuscular hemorrhage within the right iliacus muscle with the dominant collection measuring 4.5 x 8.1 cm.  Continue pain management, supportive care.  This is most likely secondary to fall.  PT/OT consulted, AIR recommended.  Patient lives with his wife, ambulates with help of walker but has not been able to use it recently. Case was discussed with general surgery by ED physician, no surgical intervention recommended.  Community acquired pneumonia: CT imaging showed multilevel groundglass infiltrates bilaterally.He  completed course of antibiotics.  Currently on room air  Right lower extremity swelling: Venous Doppler did not show any DVT  Elevated liver enzymes: Mild, continue to monitor.  Unclear etiology.  Hepatitis panel negative.  Likely from chronic hepatic congestion from severe CHF  Leukocytosis: Unclear etiology. Afebrile.  Denies any abdominal pain.  Unremarkable UA and chest x-ray.  Blood culture sent:NGTD.  Procalcitonin reassuring. Abdomen CT abdomen/pelvis did not show any acute findings except for right iliopsoas musculature which could be bursitis or hematoma.  Started on empiric Zosyn.  Hematoma infection is a possibility.  Changed to Augmentin.  Atrial flutter/fibrillation: Monitor on telemetry.  Eliquis resumed.    Follows with cardiology as an outpatient.  Currently in normal sinus rhythm.HR well controlled  Hypertension: Currently  blood pressure soft.  Hold torsemide,  Entresto.  Continue midodrine  Coronary artery disease: Lipitor on hold due to elevated enzymes.  No anginal symptoms.  Type 2 diabetes: Monitor blood sugars, continue sliding scale.A1c of 6.9.Started on insulin because he is on steroids  Rash: Developed itch maculopapular rash on his chrst, back and buttocks.  Unclear etiology.  Might have some drug reaction but not sure which one.  Ordered calamine lotion, Solu-Medrol.  Continue hydroxyzine as needed.Rash continues to improve.  Tapered steroid to oral  Lung nodule: CT imaging showed 16 mm nodule within the basilar right middle lobe, indeterminate. Repeat CT in  3 months or PET -CT     DVT prophylaxis:Place and maintain sequential compression device Start: 03/26/23 1608 apixaban (ELIQUIS) tablet 5 mg     Code Status: Full Code  Family Communication: Wife at bedside on 12/5  Patient status:Inpatient  Patient is from :Home  Anticipated discharge to:[facility]  Estimated DC date:after adequate volume management, cardiology clearance    Consultants: Cardiology, nephrology  Procedures:None  Antimicrobials:  Anti-infectives (From admission, onward)    Start     Dose/Rate Route Frequency Ordered Stop   04/04/23 0200  piperacillin-tazobactam (ZOSYN) IVPB 3.375 g        3.375 g 12.5 mL/hr over 240 Minutes Intravenous Every 8 hours 04/03/23 1912     04/03/23 1845  piperacillin-tazobactam (ZOSYN) IVPB 3.375 g        3.375 g 100 mL/hr over 30 Minutes Intravenous  Once 04/03/23 1754 04/04/23 1000   03/27/23 0600  cefTRIAXone (ROCEPHIN) 1 g in sodium chloride 0.9 % 100 mL IVPB        1 g 200 mL/hr over 30 Minutes Intravenous Every 24 hours 03/26/23 0821 03/30/23 0549   03/27/23 0600  azithromycin (ZITHROMAX) 500 mg in sodium chloride 0.9 % 250 mL IVPB  Status:  Discontinued        500 mg 250 mL/hr over 60 Minutes Intravenous Every 24 hours 03/26/23 0821 03/28/23 0934   03/26/23 0615  cefTRIAXone (ROCEPHIN) 1 g in sodium chloride 0.9 % 100  mL IVPB        1 g 200 mL/hr over 30 Minutes Intravenous  Once 03/26/23 0600 03/26/23 0744   03/26/23 0615  azithromycin (ZITHROMAX) tablet 500 mg        500 mg Oral  Once 03/26/23 0600 03/26/23 0612       Subjective: Patient seen and examined at bedside today.  Hemodynamically stable.  Comfortable.  Denies any complaints.  No shortness of breath or cough today.  Rash progressively improves  Objective: Vitals:   04/05/23 0400 04/05/23 0626 04/05/23 0716 04/05/23 1102  BP: (!) 149/69  124/63 119/60  Pulse: 97  98 85  Resp: 14  18 14   Temp: 98.4 F (36.9 C)  98.4 F (36.9 C) 98.6 F (37 C)  TempSrc: Oral  Oral Oral  SpO2: 95%  91% 95%  Weight:  90 kg    Height:        Intake/Output Summary (Last 24 hours) at 04/05/2023 1115 Last data filed at 04/05/2023 1103 Gross per 24 hour  Intake 491.44 ml  Output 725 ml  Net -233.56 ml   Filed Weights   04/03/23 0526 04/04/23 0505 04/05/23 0626  Weight: 91.9 kg 91.6 kg 90 kg    Examination:   General exam: Overall comfortable, not in distress, pleasant elderly male HEENT: PERRL Respiratory system:  no wheezes or crackles , mildly  diminished sounds bilaterally Cardiovascular  system: S1 & S2 heard, RRR.  Gastrointestinal system: Abdomen is nondistended, soft and nontender. Central nervous system: Alert and oriented Extremities: Right lower LE edema pitting edema, no clubbing ,no cyanosis Skin: improving macular rash, no ulcers,no icterus     Data Reviewed: I have personally reviewed following labs and imaging studies  CBC: Recent Labs  Lab 04/01/23 1230 04/02/23 0214 04/03/23 0544 04/04/23 0600 04/05/23 0500  WBC 17.2* 21.7* 27.8* 18.8* 21.1*  HGB 9.9* 10.5* 9.0* 8.7* 8.5*  HCT 30.2* 32.3* 27.6* 27.4* 26.2*  MCV 83.0 81.6 80.9 81.1 80.1  PLT 742* 786* 803* 783* 711*   Basic Metabolic Panel: Recent Labs  Lab 03/31/23 0823 04/02/23 0214 04/03/23 0544 04/04/23 0600 04/05/23 0500  NA 130* 131* 131* 132* 130*   K 4.0 4.1 3.4* 4.2 3.9  CL 101 102 100 100 98  CO2 18* 20* 21* 25 20*  GLUCOSE 116* 196* 138* 250* 287*  BUN 67* 65* 65* 63* 60*  CREATININE 3.43* 2.58* 2.35* 2.23* 2.40*  CALCIUM 8.3* 8.1* 8.0* 7.9* 7.8*  MG  --  2.1 2.1 2.0 2.0     Recent Results (from the past 240 hour(s))  Culture, blood (Routine X 2) w Reflex to ID Panel     Status: None (Preliminary result)   Collection Time: 04/02/23  9:26 AM   Specimen: BLOOD  Result Value Ref Range Status   Specimen Description BLOOD BLOOD RIGHT HAND  Final   Special Requests   Final    BOTTLES DRAWN AEROBIC AND ANAEROBIC Blood Culture results may not be optimal due to an inadequate volume of blood received in culture bottles   Culture   Final    NO GROWTH 3 DAYS Performed at [facility], [address] [place], [place] [address]    Report Status PENDING  Incomplete  Culture, blood (Routine X 2) w Reflex to ID Panel     Status: None (Preliminary result)   Collection Time: 04/02/23  9:26 AM   Specimen: BLOOD  Result Value Ref Range Status   Specimen Description BLOOD BLOOD RIGHT HAND  Final   Special Requests   Final    BOTTLES DRAWN AEROBIC AND ANAEROBIC Blood Culture results may not be optimal due to an inadequate volume of blood received in culture bottles   Culture   Final    NO GROWTH 3 DAYS Performed at [facility], [address] [place], [place] [address]    Report Status PENDING  Incomplete     Radiology Studies: CT ABDOMEN PELVIS WO CONTRAST  Result Date: 04/03/2023 CLINICAL DATA:  Sepsis.  Hip pain. EXAM: CT ABDOMEN AND PELVIS WITHOUT CONTRAST TECHNIQUE: Multidetector CT imaging of the abdomen and pelvis was performed following the standard protocol without IV contrast. RADIATION DOSE REDUCTION: This exam was performed according to the departmental dose-optimization program which includes automated exposure control, adjustment of the mA and/or kV according to patient size and/or use of iterative  reconstruction technique. COMPARISON:  Abdominopelvic CT 03/26/2023.  Pelvic CT 04/16/2019. FINDINGS: Lower chest: Cardiac pacemaker leads extend into the right atrium and right ventricle. There is aortic and coronary artery atherosclerosis. Unchanged moderate right and small left pleural effusions with associated compressive atelectasis at both lung bases. The recently demonstrated right middle lobe nodule is incompletely visualized, although grossly stable. No new nodularity identified at the visualized lung bases. There is underlying mild to moderate centrilobular emphysema. Hepatobiliary: The liver appears stable, without focal abnormality on noncontrast imaging. No evidence  of significant biliary dilatation status post cholecystectomy. Pancreas: Unremarkable. No pancreatic ductal dilatation or surrounding inflammatory changes. Spleen: Normal in size without focal abnormality. Adrenals/Urinary Tract: Both adrenal glands appear normal. No evidence of urinary tract calculus, suspicious renal lesion or hydronephrosis. Stable renal atrophy and cortical scarring on the right. Stable renal vascular calcifications bilaterally. The bladder appears unremarkable for its degree of distention. Stomach/Bowel: Small amount of enteric contrast in the stomach. The stomach appears unremarkable for its degree of distension. No evidence of bowel wall thickening, distention or surrounding inflammatory change. The appendix is tentatively identified and appears unremarkable. There is prominent stool throughout the colon. Mild diverticular changes throughout the descending and sigmoid colon. Vascular/Lymphatic: There are no enlarged abdominal or pelvic lymph nodes. Extensive aortic and branch vessel atherosclerosis again noted without evidence of aneurysm. Reproductive: The prostate gland and seminal vesicles appear unremarkable. Other: No significant change in recently demonstrated multifocal fluid collections along the course of the  right iliopsoas musculature. A superior component along the right posterolateral abdominal wall measures 4.3 x 3.0 cm on image 41/3. Ill-defined component associated with the right iliacus muscle measures approximately 7.8 x 4.3 cm on image 56/3. Distal component anterior to the right femoral head measures approximately 4.4 x 2.4 cm on image 82/3 and may be slightly larger. These collections do not demonstrate any high-density components or air. No ascites or pneumoperitoneum. No abdominal wall hernia. Musculoskeletal: No evidence of acute fracture, dislocation or osteomyelitis. Degenerative changes of the sacroiliac joints and mild multilevel lumbar spondylosis. Subcutaneous edema in both flanks has mildly increased. There is asymmetric extension of subcutaneous edema into the proximal right thigh. IMPRESSION: 1. No significant change in recently demonstrated multifocal fluid collections along the course of the right iliopsoas musculature. The distal component anterior to the right femoral head may be slightly larger. These collections do not demonstrate any high-density components or air and could be secondary to subacute or chronic hematoma versus bursitis. In the setting of sepsis, aspiration should be considered to exclude infection. 2. No evidence of osteomyelitis or discitis. 3. Unchanged moderate right and small left pleural effusions with associated compressive atelectasis at both lung bases. 4. Mildly increased subcutaneous edema in both flanks with asymmetric extension into the proximal right thigh. 5. Incompletely visualized, but grossly stable right middle lobe nodule. See follow up recommendations from previous chest CT of 03/26/2023. 6. Aortic Atherosclerosis (ICD10-I70.0) and Emphysema (ICD10-J43.9). Electronically Signed   By: Carey Bullocks M.D.   On: 04/03/2023 16:02    Scheduled Meds:  amiodarone  200 mg Oral Daily   apixaban  5 mg Oral BID   calamine  1 Application Topical TID    Chlorhexidine Gluconate Cloth  6 each Topical Daily   furosemide  40 mg Intravenous BID   hydrocortisone cream   Topical TID   influenza vaccine adjuvanted  0.5 mL Intramuscular Tomorrow-1000   insulin aspart  0-15 Units Subcutaneous TID WC   insulin aspart  0-5 Units Subcutaneous QHS   insulin glargine-yfgn  8 Units Subcutaneous Daily   midodrine  10 mg Oral TID WC   pneumococcal 20-valent conjugate vaccine  0.5 mL Intramuscular Tomorrow-1000   polyethylene glycol  17 g Oral Daily   [START ON 04/08/2023] predniSONE  10 mg Oral Q breakfast   [START ON 04/07/2023] predniSONE  20 mg Oral Q breakfast   [START ON 04/06/2023] predniSONE  40 mg Oral Q breakfast   senna-docusate  1 tablet Oral BID   sodium bicarbonate  650  mg Oral BID   sodium chloride flush  10-40 mL Intracatheter Q12H   sodium chloride flush  3 mL Intravenous Q12H   Continuous Infusions:  milrinone 0.25 mcg/kg/min (04/05/23 0620)   piperacillin-tazobactam (ZOSYN)  IV 3.375 g (04/05/23 1040)      LOS: 10 days   Burnadette Pop, MD [facility] P12/09/2022, 11:15 AM

## 2023-04-05 NOTE — Plan of Care (Signed)
  Problem: Education: Goal: Knowledge of General Education information will improve Description: Including pain rating scale, medication(s)/side effects and non-pharmacologic comfort measures Outcome: Progressing   Problem: Health Behavior/Discharge Planning: Goal: Ability to manage health-related needs will improve Outcome: Progressing   Problem: Clinical Measurements: Goal: Ability to maintain clinical measurements within normal limits will improve Outcome: Progressing Goal: Will remain free from infection Outcome: Progressing Goal: Diagnostic test results will improve Outcome: Progressing Goal: Respiratory complications will improve Outcome: Progressing Goal: Cardiovascular complication will be avoided Outcome: Progressing   Problem: Activity: Goal: Risk for activity intolerance will decrease Outcome: Progressing   Problem: Nutrition: Goal: Adequate nutrition will be maintained Outcome: Progressing   Problem: Coping: Goal: Level of anxiety will decrease Outcome: Progressing   Problem: Elimination: Goal: Will not experience complications related to bowel motility Outcome: Progressing Goal: Will not experience complications related to urinary retention Outcome: Progressing   Problem: Pain Management: Goal: General experience of comfort will improve Outcome: Progressing   Problem: Safety: Goal: Ability to remain free from injury will improve Outcome: Progressing   Problem: Skin Integrity: Goal: Risk for impaired skin integrity will decrease Outcome: Progressing   Problem: Activity: Goal: Ability to tolerate increased activity will improve Outcome: Progressing   Problem: Clinical Measurements: Goal: Ability to maintain a body temperature in the normal range will improve Outcome: Progressing   Problem: Respiratory: Goal: Ability to maintain adequate ventilation will improve Outcome: Progressing Goal: Ability to maintain a clear airway will improve Outcome:  Progressing   Problem: Education: Goal: Ability to describe self-care measures that may prevent or decrease complications (Diabetes Survival Skills Education) will improve Outcome: Progressing Goal: Individualized Educational Video(s) Outcome: Progressing   Problem: Coping: Goal: Ability to adjust to condition or change in health will improve Outcome: Progressing   Problem: Fluid Volume: Goal: Ability to maintain a balanced intake and output will improve Outcome: Progressing   Problem: Health Behavior/Discharge Planning: Goal: Ability to identify and utilize available resources and services will improve Outcome: Progressing Goal: Ability to manage health-related needs will improve Outcome: Progressing   Problem: Metabolic: Goal: Ability to maintain appropriate glucose levels will improve Outcome: Progressing   Problem: Nutritional: Goal: Maintenance of adequate nutrition will improve Outcome: Progressing Goal: Progress toward achieving an optimal weight will improve Outcome: Progressing   Problem: Skin Integrity: Goal: Risk for impaired skin integrity will decrease Outcome: Progressing   Problem: Tissue Perfusion: Goal: Adequacy of tissue perfusion will improve Outcome: Progressing

## 2023-04-05 NOTE — Progress Notes (Addendum)
Rounding Note    Patient Name: [name] [name] Date of Encounter: 04/05/2023  [facility] Cardiologist: Arvilla Meres, MD   Subjective   Urine output less yesterday, Cr up slightly, liver enzymes up slightly. White count up from yesterday though not as high as day prior, Hgb slightly downtrending. Reports rash is improved, no new concerns. Went into a rate controlled atrial flutter yesterday afternoon and has been in this since that time. He does not feel differently. Hoping to go to inpatient rehab over the weekend.  Inpatient Medications    Scheduled Meds:  amiodarone  200 mg Oral Daily   amoxicillin-clavulanate  1 tablet Oral BID   apixaban  5 mg Oral BID   calamine  1 Application Topical TID   Chlorhexidine Gluconate Cloth  6 each Topical Daily   furosemide  40 mg Intravenous BID   hydrocortisone cream   Topical TID   influenza vaccine adjuvanted  0.5 mL Intramuscular Tomorrow-1000   insulin aspart  0-15 Units Subcutaneous TID WC   insulin aspart  0-5 Units Subcutaneous QHS   insulin glargine-yfgn  8 Units Subcutaneous Daily   midodrine  10 mg Oral TID WC   pneumococcal 20-valent conjugate vaccine  0.5 mL Intramuscular Tomorrow-1000   polyethylene glycol  17 g Oral Daily   [START ON 04/08/2023] predniSONE  10 mg Oral Q breakfast   [START ON 04/07/2023] predniSONE  20 mg Oral Q breakfast   [START ON 04/06/2023] predniSONE  40 mg Oral Q breakfast   senna-docusate  1 tablet Oral BID   sodium bicarbonate  650 mg Oral BID   sodium chloride flush  10-40 mL Intracatheter Q12H   sodium chloride flush  3 mL Intravenous Q12H   Continuous Infusions:  milrinone 0.125 mcg/kg/min (04/05/23 1854)   PRN Meds: albuterol, benzonatate, HYDROmorphone (DILAUDID) injection, hydrOXYzine, magic mouthwash, melatonin, ondansetron **OR** ondansetron (ZOFRAN) IV, oxyCODONE, sodium chloride flush, sodium chloride flush   Vital Signs    Vitals:   04/05/23 0716 04/05/23 1102  04/05/23 1550 04/05/23 1952  BP: 124/63 119/60 (!) 143/78 124/65  Pulse: 98 85 90 88  Resp: 18 14 14 13   Temp: 98.4 F (36.9 C) 98.6 F (37 C) 98.7 F (37.1 C) 98.6 F (37 C)  TempSrc: Oral Oral Oral Oral  SpO2: 91% 95% 91% 96%  Weight:      Height:        Intake/Output Summary (Last 24 hours) at 04/05/2023 2002 Last data filed at 04/05/2023 1551 Gross per 24 hour  Intake 360 ml  Output 925 ml  Net -565 ml      04/05/2023    6:26 AM 04/04/2023    5:05 AM 04/03/2023    5:26 AM  Last 3 Weights  Weight (lbs) 198 lb 6.6 oz 201 lb 15.1 oz 202 lb 9.6 oz  Weight (kg) 90 kg 91.6 kg 91.9 kg      Telemetry    Rate controlled atypical atrial flutter- Personally Reviewed  Physical Exam   GEN: Well nourished, well developed in no acute distress. O2 by nasal cannula NECK: JVD at clavicle at 45 degrees CARDIAC: regular rhythm, normal S1 and S2, no rubs or gallops. 2/6 systolic murmur. VASCULAR: Radial pulses 2+ bilaterally.  RESPIRATORY:  Diffusely coarse, diminished at bilateral bases but slowly improving ABDOMEN: Soft, non-tender, non-distended MUSCULOSKELETAL:  Moves all 4 limbs independently SKIN: Warm and dry, no LLE edema, improving RLE edema NEUROLOGIC:  No focal neuro deficits noted. PSYCHIATRIC:  Normal affect  Skin: diffuse erythematous papules across body, less erythematous than prior  New pertinent results (labs, ECG, imaging, cardiac studies)    Summarized below  Patient Profile     77 y.o. male with hx of COPD, type 2 diabetes, coronary artery disease status post CABG in 2018 with stage D systolic heart failure status post CCM, hypertension, history of bladder cancer and paroxysmal atrial fibrillation, admitted 11/26 with large retroperitoneal hematoma (home Eliquis now held) and shortness of breath. Treated for CAP, started on milrinone this admission for cardiorenal syndrome  Assessment & Plan    Acute on chronic systolic and diastolic heart failure Ischemic  cardiomyopathy Hypotension AKI on CKD stage 3b Cardiorenal syndrome -EF 20-25% -nephrology following, no acute indications for HD -no sglt2i due to yeast infections -hyperkalemia/gynecomastia on spironolactone -on midodrine, no BP room for GDMT -Cr today 2.40, down from 3.43 12/2 but up slightly from yesterday, baseline ~2, minimal urine output with lasix prior to milrinone -started milrinone 12/2 with improvement in urine output and Cr.  Will wean milrinone overnight to 0.125 mcg/kg/min, monitor renal function. If stable, would stop milrinone in the AM. -Initial coox 64, was 79 today -blood pressure stable on milrinone, continue midodrine for now. Blood pressures significantly improving on milrinone, may be able to cut back on midodrine if this continues to improve. Will aim for as much BP as we can get right now to facilitate diuresis. -admission weight 89.5 kg, peak weight 94.9 kg 12/2, weight today 90 kg -has not had significant ectopy on milrinone   RLE edema -negative for DVT -may be in the setting of venous/lymphatic compression from RP hematoma given right sided location of hematoma (with notable iliopsoas and iliacus intramuscular hemorrhage) -continues to improve   Atrial flutter RP hematoma -DCCV 11/7, but anticoagulation held on admission given RP bleed -has remained in sinus throughout admission until 12/5 -hgb dropped from 10.5 >9.0>8.7>8.5 today. Monitor, denies new pain.  -re-imaged with CT 12/4, fluid collections largely unchanged, possibly slightly larger size of collection anterior to right femoral head, but does not suggest substantial new bleeding to explain Hgb drop -apixaban restarted 12/4. Had interrupted anticoagulation this admission but was in sinus throughout that time. -continue amiodarone   CAD History of 2V CABG 2018 -atorvastatin on hold given elevated LFTs, likely 2/2 congestive hepatopathy. Restart statin once LFTs normalized. -no aspirin long term  as he is on DOAC  Rash Concern for infection  -has been started on hydroxyzine and topical hydrocortisone as well as solumedrol -WBC continued to rise, procalcitonin mid range -completed ceftriaxone/azithromycin for CAP earlier this admission, started on zosyn per primary team  Severe deconditioning -cannot even sit up in bed without assistance. Working with PT. Agree with rehab post discharge.    Signed, Jodelle Red, MD  04/05/2023, 8:02 PM

## 2023-04-05 NOTE — Progress Notes (Addendum)
Occupational Therapy Treatment Patient Details Name: [name] [name] MRN: [id number] DOB: [birth year] Today's Date: 04/05/2023   History of present illness 77 y.o. male presents to [facility] hospital on 03/25/2023 with progressively worsening R hip pain. Pt found to have retroperitoneal hematoma extending into the distal R psoas and R iliacus. Started on antibiotics for possible PNA. Negative [place] for R LE DVT. Pt was recently admitted 11/5-11/8 after a fall and R distal clavicle fx. PMH includes AICD, bladder CA, CHF, CAD, DMII, PAF, PNA. 11/5 right distal clavicle fracture that was managed nonoperatively.   OT comments  Pt. Seen for skilled OT treatment session.  Focus of session was bed mobility and LB ADL strategies.  Pt. Progressing with bed mobility requiring only assistance for guiding trunk for oob.  No assistance for back to bed.  Cont. With acute OT POC.        If plan is discharge home, recommend the following:  A lot of help with walking and/or transfers;Two people to help with bathing/dressing/bathroom   Equipment Recommendations       Recommendations for Other Services Rehab consult    Precautions / Restrictions Precautions Precautions: Fall Precaution Comments: watch BP and HR Restrictions RUE Weight Bearing: Weight bearing as tolerated Other Position/Activity Restrictions: RUE WBAT allowed per Dr Renford Dills 12/2 10:47 AM       Mobility Bed Mobility Overal bed mobility: Needs Assistance Bed Mobility: Rolling, Sidelying to Sit, Sit to Supine Rolling: Supervision, Used rails Sidelying to sit: Min assist   Sit to supine: Supervision   General bed mobility comments: pt. reports having bed rail at home so utilized bed rail to simulate home env. pt. exited on R side. able to manage bles without assistance but required light trunk support for coming upright.  for back to bed pt. states he does not scoot towards hob as rail is in the way so he demonstrated how he gets into bed he  scoots backwards first then laid back and brought bles into bed without assistance.  heavy reliance on using bed controls to elevated knees/feet. reviewed if he does not have these functions on his bed to try and find ways to be comfortable without.  he states "ill just stack pillows"    Transfers                         Balance                                           ADL either performed or assessed with clinical judgement   ADL Overall ADL's : Needs assistance/impaired                       Lower Body Dressing Details (indicate cue type and reason): worked on [employer] dressing strategies in conjunction with same tech. That can be utilized during lb bathing.  seated eob, able to reach LLE but not R. states at home he has slip on sneakers and does not wear socks so does not need to access his feet.  when reviewing pants/underwear pt. reports he does not wear underwear and lays pants/shorts on floor and slips feet into them then pulls them up.  Provided demo of another way to access les by turning sideways in the bed  General ADL Comments: bed mobility and lb dressing review    Extremity/Trunk Assessment              Vision       Perception     Praxis      Cognition Arousal: Alert Behavior During Therapy: WFL for tasks assessed/performed Overall Cognitive Status: Within Functional Limits for tasks assessed                                 General Comments: mildly jittery and impulsive, cues needed to take his time.        Exercises      Shoulder Instructions       General Comments      Pertinent Vitals/ Pain       Pain Assessment Pain Assessment: Faces Faces Pain Scale: Hurts a little bit Pain Location: R hip Pain Descriptors / Indicators: Aching Pain Intervention(s): Limited activity within patient's tolerance, Monitored during session, Repositioned  Home Living                                           Prior Functioning/Environment              Frequency  Min 1X/week        Progress Toward Goals  OT Goals(current goals can now be found in the care plan section)  Progress towards OT goals: Progressing toward goals     Plan      Co-evaluation                 AM-PAC OT "6 Clicks" Daily Activity     Outcome Measure   Help from another person eating meals?: None Help from another person taking care of personal grooming?: A Little Help from another person toileting, which includes using toliet, bedpan, or urinal?: A Lot Help from another person bathing (including washing, rinsing, drying)?: A Lot Help from another person to put on and taking off regular upper body clothing?: A Little Help from another person to put on and taking off regular lower body clothing?: A Lot 6 Click Score: 16    End of Session    OT Visit Diagnosis: Unsteadiness on feet (R26.81);History of falling (Z91.81);Pain Pain - Right/Left: Right Pain - part of body: Hip   Activity Tolerance Patient tolerated treatment well   Patient Left in bed;with call bell/phone within reach;with bed alarm set   Nurse Communication Other (comment) (rn states ok to work with pt. secure chat at end of sesssion ro review pt. removal of o2 during bed mobility but maintained 90% without. placed back on at end of session)        Time: 1610-9604 OT Time Calculation (min): 22 min  Charges: OT General Charges $OT Visit: 1 Visit OT Treatments $Self Care/Home Management : 8-22 mins  Boneta Lucks, COTA/L Acute Rehabilitation [phone]   Alessandra Bevels Lorraine-COTA/L 04/05/2023, 9:09 AM

## 2023-04-05 NOTE — Progress Notes (Signed)
Inpatient Rehab Admissions Coordinator:   CIR following. Pt is not yet medically ready for CIR but I will continue to follow for potential admit pending medical readiness.   Megan Salon, MS, CCC-SLP Rehab Admissions Coordinator  [phone] (celll) [phone] (office)

## 2023-04-06 DIAGNOSIS — K683 Retroperitoneal hematoma: Secondary | ICD-10-CM | POA: Diagnosis not present

## 2023-04-06 DIAGNOSIS — I5043 Acute on chronic combined systolic (congestive) and diastolic (congestive) heart failure: Secondary | ICD-10-CM | POA: Diagnosis not present

## 2023-04-06 DIAGNOSIS — I4892 Unspecified atrial flutter: Secondary | ICD-10-CM | POA: Diagnosis not present

## 2023-04-06 LAB — GLUCOSE, CAPILLARY
Glucose-Capillary: 113 mg/dL — ABNORMAL HIGH (ref 70–99)
Glucose-Capillary: 439 mg/dL — ABNORMAL HIGH (ref 70–99)
Glucose-Capillary: 283 mg/dL — ABNORMAL HIGH (ref 70–99)
Glucose-Capillary: 228 mg/dL — ABNORMAL HIGH (ref 70–99)

## 2023-04-06 LAB — CBC
HCT: 30.3 % — ABNORMAL LOW (ref 39.0–52.0)
Hemoglobin: 10 g/dL — ABNORMAL LOW (ref 13.0–17.0)
MCH: 26.7 pg (ref 26.0–34.0)
MCHC: 33 g/dL (ref 30.0–36.0)
MCV: 80.8 fL (ref 80.0–100.0)
Platelets: 834 10*3/uL — ABNORMAL HIGH (ref 150–400)
RBC: 3.75 MIL/uL — ABNORMAL LOW (ref 4.22–5.81)
RDW: 17.5 % — ABNORMAL HIGH (ref 11.5–15.5)
WBC: 28.8 10*3/uL — ABNORMAL HIGH (ref 4.0–10.5)
nRBC: 0.1 % (ref 0.0–0.2)

## 2023-04-06 LAB — COOXEMETRY PANEL
Carboxyhemoglobin: 1.4 % (ref 0.5–1.5)
Methemoglobin: <0.7 % (ref 0.0–1.5)
O2 Saturation: 66.6 %
Total hemoglobin: 10.2 g/dL — ABNORMAL LOW (ref 12.0–16.0)

## 2023-04-06 LAB — BASIC METABOLIC PANEL
Anion gap: 6 (ref 5–15)
BUN: 57 mg/dL — ABNORMAL HIGH (ref 8–23)
CO2: 27 mmol/L (ref 22–32)
Calcium: 7.9 mg/dL — ABNORMAL LOW (ref 8.9–10.3)
Chloride: 100 mmol/L (ref 98–111)
Creatinine, Ser: 2.02 mg/dL — ABNORMAL HIGH (ref 0.61–1.24)
GFR, Estimated: 33 mL/min — ABNORMAL LOW (ref >60)
Glucose, Bld: 127 mg/dL — ABNORMAL HIGH (ref 70–99)
Potassium: 4 mmol/L (ref 3.5–5.1)
Sodium: 133 mmol/L — ABNORMAL LOW (ref 135–145)

## 2023-04-06 LAB — MAGNESIUM: Magnesium: 2 mg/dL (ref 1.7–2.4)

## 2023-04-06 MED ORDER — INSULIN ASPART 100 UNIT/ML IJ SOLN: 10.0000 [IU] | Freq: Once | INTRAMUSCULAR | Status: AC

## 2023-04-06 NOTE — Progress Notes (Signed)
PROGRESS NOTE  MENA MICHELOTTI  ZOX:[id number] DOB: 1946-02-18 DOA: 03/25/2023 PCP: [name], MD   Brief Narrative: Patient is a 77 year old male with history of hypertension, coronary artery disease, paroxysmal A-fib/flutter, chronic systolic CHF, status post pacemaker placement, peripheral vascular disease, diabetes type 2, bladder cancer, CKD stage IIIb who presented from home with complaint of hip pain after the fall.  He was also admitted on 03/05/2023 and was discharged on 11/24 after he presented with fall at home, found to have right distal clavicle fracture that was managed nonoperatively.  Patient reported worsening progressive right hip pain for last 2 days, unable to ambulate.  On presentation, he was hemodynamically stable.  Lab work showed WBC count of 14.2, creatinine of 1.9, hemoglobin of 10.9.  CT hip showed evidence of right retroperitoneal hematoma extending into the distal right psoas and right iliacus muscle.  CT chest also showed multifocal groundglass opacities bilaterally.  Started on antibiotics for possible PNA.  Cardiology consulted for volume overload and nephrology consulted for worsening renal function..  Due to severe systolic CHF and volume overload, started on milrinone on 12/2.  Now clinically improving.  Ultimate plan is to discharge him to CIR after stopping milrinone drip  Assessment & Plan:  Principal Problem:   Retroperitoneal hematoma Active Problems:   Acute on chronic combined systolic and diastolic heart failure (HCC)   Stage 3b chronic kidney disease (HCC)   Low output heart failure (HCC)   AKI (acute kidney injury) (HCC)   Acute on Chronic systolic congestive heart failure:   Status post AICD.  Home torsemide,Entresto on hold. Last echo done on 11/7 showed EF of 20 to 25%.  Elevated BNP. Hospital course remarkable for persistent volume overload.  Started on IV Lasix and milrinone after PICC line placement.  Volume status improving.  Cardiology  planning to stop milrinone tomorrow.  AKI on CKD stage IIIb: Baseline creatinine around 2.5.  Kidney function trended up to 3 . He follows with [facility], nephrology also consulted, given need of persistent diuresis with IV medications and worsening kidney function. Currently on IV Lasix.  Creatinine trending down  Fall/retroperitoneal hematoma:CT right hip showed Retroperitoneal hematoma extending into the distal right psoas and right iliacus muscle. CT imaging also showed multilocular acute to subacute retroperitoneal hematoma with a component noted superiorly along the posterolateral abdominal wall measuring 4.4 x 2.6 cm. Intramuscular hemorrhage within the right iliacus muscle with the dominant collection measuring 4.5 x 8.1 cm.  Continue pain management, supportive care.  This is most likely secondary to fall.  PT/OT consulted, AIR recommended.  Patient lives with his wife, ambulates with help of walker but has not been able to use it recently. Case was discussed with general surgery by ED physician, no surgical intervention recommended.  Community acquired pneumonia: CT imaging showed multilevel groundglass infiltrates bilaterally.currently stable.  Continue oral antibiotics to finish the course.  Right lower extremity swelling: Venous Doppler did not show any DVT  Elevated liver enzymes: Mild, continue to monitor.  Unclear etiology.  Hepatitis panel negative.  Likely from chronic hepatic congestion from severe CHF  Leukocytosis: Unclear etiology.  Chronic.  On his last admission also, he had leukocytosis in the range of 20K .No fever  Denies any abdominal pain.  Unremarkable UA and chest x-ray.  Blood culture sent:NGTD.  Procalcitonin reassuring. Abdomen CT abdomen/pelvis did not show any acute findings except for right iliopsoas musculature which could be bursitis or hematoma.  Started on empiric Zosyn now changed  to Augmentin.  Leukocytosis probably worsened because of steroids.   Anticipating gradual improvement.  Atrial flutter/fibrillation: Monitor on telemetry.  Eliquis resumed.    Follows with cardiology as an outpatient.  Currently in normal sinus rhythm.HR well controlled  Hypertension: Currently blood pressure soft.  Hold torsemide, Entresto.  Continue midodrine  Coronary artery disease: Lipitor on hold due to elevated enzymes.  No anginal symptoms.  Type 2 diabetes: Monitor blood sugars, continue sliding scale.A1c of 6.9.Started on insulin because he is on steroids.  Rash: Developed itch maculopapular rash on his chrst, back and buttocks.  Unclear etiology.  Might have some drug reaction but not sure which one.  Ordered calamine lotion, Solu-Medrol, hydroxyzine as needed.Rash continues to improve,now almost resolved.  Tapered steroid to oral  Lung nodule: CT imaging showed 16 mm nodule within the basilar right middle lobe, indeterminate. Repeat CT in  3 months or PET -CT     DVT prophylaxis:Place and maintain sequential compression device Start: 03/26/23 1608 apixaban (ELIQUIS) tablet 5 mg     Code Status: Full Code  Family Communication: Wife at bedside on 12/5  Patient status:Inpatient  Patient is from :Home  Anticipated discharge to:AIR  Estimated DC date:after cardiology clearance , needs to stop milrinone drip.   Consultants: Cardiology, nephrology  Procedures:None  Antimicrobials:  Anti-infectives (From admission, onward)    Start     Dose/Rate Route Frequency Ordered Stop   04/05/23 1230  amoxicillin-clavulanate (AUGMENTIN) 500-125 MG per tablet 1 tablet        1 tablet Oral 2 times daily 04/05/23 1130 04/10/23 0959   04/04/23 0200  piperacillin-tazobactam (ZOSYN) IVPB 3.375 g  Status:  Discontinued        3.375 g 12.5 mL/hr over 240 Minutes Intravenous Every 8 hours 04/03/23 1912 04/05/23 1130   04/03/23 1845  piperacillin-tazobactam (ZOSYN) IVPB 3.375 g        3.375 g 100 mL/hr over 30 Minutes Intravenous  Once 04/03/23 1754  04/04/23 1000   03/27/23 0600  cefTRIAXone (ROCEPHIN) 1 g in sodium chloride 0.9 % 100 mL IVPB        1 g 200 mL/hr over 30 Minutes Intravenous Every 24 hours 03/26/23 0821 03/30/23 0549   03/27/23 0600  azithromycin (ZITHROMAX) 500 mg in sodium chloride 0.9 % 250 mL IVPB  Status:  Discontinued        500 mg 250 mL/hr over 60 Minutes Intravenous Every 24 hours 03/26/23 0821 03/28/23 0934   03/26/23 0615  cefTRIAXone (ROCEPHIN) 1 g in sodium chloride 0.9 % 100 mL IVPB        1 g 200 mL/hr over 30 Minutes Intravenous  Once 03/26/23 0600 03/26/23 0744   03/26/23 0615  azithromycin (ZITHROMAX) tablet 500 mg        500 mg Oral  Once 03/26/23 0600 03/26/23 0612       Subjective: Seen and examined at the bedside today.  Hemodynamically stable.  Looks much better today.  Feels better.  On 1 L of oxygen for comfort only.  Denies any shortness of breath or cough.  Lower extremity edema significantly improved.  Rash has disappeared almost  Objective: Vitals:   04/05/23 1952 04/05/23 2347 04/06/23 0450 04/06/23 0819  BP: 124/65 120/63 111/71 129/70  Pulse: 88 91 93   Resp: 13 16 19    Temp: 98.6 F (37 C) 98.7 F (37.1 C) 98.7 F (37.1 C) 98.1 F (36.7 C)  TempSrc: Oral Oral Oral Oral  SpO2: 96% 96% 95%  Weight:   89.9 kg   Height:        Intake/Output Summary (Last 24 hours) at 04/06/2023 1130 Last data filed at 04/06/2023 0820 Gross per 24 hour  Intake 360 ml  Output 1450 ml  Net -1090 ml   Filed Weights   04/04/23 0505 04/05/23 0626 04/06/23 0450  Weight: 91.6 kg 90 kg 89.9 kg    Examination:  General exam: Overall comfortable, not in distress, pleasant elderly male HEENT: PERRL Respiratory system:  no wheezes or crackles  Cardiovascular system: S1 & S2 heard, RRR.  Gastrointestinal system: Abdomen is nondistended, soft and nontender. Central nervous system: Alert and oriented Extremities: No edema, no clubbing ,no cyanosis Skin: No rashes, no ulcers,no icterus  ,  scattered bruises   Data Reviewed: I have personally reviewed following labs and imaging studies  CBC: Recent Labs  Lab 04/02/23 0214 04/03/23 0544 04/04/23 0600 04/05/23 0500 04/06/23 0500  WBC 21.7* 27.8* 18.8* 21.1* 28.8*  HGB 10.5* 9.0* 8.7* 8.5* 10.0*  HCT 32.3* 27.6* 27.4* 26.2* 30.3*  MCV 81.6 80.9 81.1 80.1 80.8  PLT 786* 803* 783* 711* 834*   Basic Metabolic Panel: Recent Labs  Lab 04/02/23 0214 04/03/23 0544 04/04/23 0600 04/05/23 0500 04/06/23 0500  NA 131* 131* 132* 130* 133*  K 4.1 3.4* 4.2 3.9 4.0  CL 102 100 100 98 100  CO2 20* 21* 25 20* 27  GLUCOSE 196* 138* 250* 287* 127*  BUN 65* 65* 63* 60* 57*  CREATININE 2.58* 2.35* 2.23* 2.40* 2.02*  CALCIUM 8.1* 8.0* 7.9* 7.8* 7.9*  MG 2.1 2.1 2.0 2.0 2.0     Recent Results (from the past 240 hour(s))  Culture, blood (Routine X 2) w Reflex to ID Panel     Status: None (Preliminary result)   Collection Time: 04/02/23  9:26 AM   Specimen: BLOOD  Result Value Ref Range Status   Specimen Description BLOOD BLOOD RIGHT HAND  Final   Special Requests   Final    BOTTLES DRAWN AEROBIC AND ANAEROBIC Blood Culture results may not be optimal due to an inadequate volume of blood received in culture bottles   Culture   Final    NO GROWTH 4 DAYS Performed at [facility], [address] [place], [place] [address]    Report Status PENDING  Incomplete  Culture, blood (Routine X 2) w Reflex to ID Panel     Status: None (Preliminary result)   Collection Time: 04/02/23  9:26 AM   Specimen: BLOOD  Result Value Ref Range Status   Specimen Description BLOOD BLOOD RIGHT HAND  Final   Special Requests   Final    BOTTLES DRAWN AEROBIC AND ANAEROBIC Blood Culture results may not be optimal due to an inadequate volume of blood received in culture bottles   Culture   Final    NO GROWTH 4 DAYS Performed at [facility], [address], [place], [place] [address]    Report Status PENDING  Incomplete      Radiology Studies: No results found.  Scheduled Meds:  amiodarone  200 mg Oral Daily   amoxicillin-clavulanate  1 tablet Oral BID   apixaban  5 mg Oral BID   calamine  1 Application Topical TID   Chlorhexidine Gluconate Cloth  6 each Topical Daily   furosemide  40 mg Intravenous BID   hydrocortisone cream   Topical TID   influenza vaccine adjuvanted  0.5 mL Intramuscular Tomorrow-1000   insulin aspart  0-15 Units Subcutaneous TID WC   insulin aspart  0-5 Units Subcutaneous QHS   insulin glargine-yfgn  8 Units Subcutaneous Daily   midodrine  10 mg Oral TID WC   pneumococcal 20-valent conjugate vaccine  0.5 mL Intramuscular Tomorrow-1000   polyethylene glycol  17 g Oral Daily   [START ON 04/08/2023] predniSONE  10 mg Oral Q breakfast   [START ON 04/07/2023] predniSONE  20 mg Oral Q breakfast   senna-docusate  1 tablet Oral BID   sodium bicarbonate  650 mg Oral BID   sodium chloride flush  10-40 mL Intracatheter Q12H   sodium chloride flush  3 mL Intravenous Q12H   Continuous Infusions:  milrinone 0.125 mcg/kg/min (04/06/23 0217)      LOS: 11 days   Burnadette Pop, MD [facility] P12/10/2022, 11:30 AM

## 2023-04-06 NOTE — Progress Notes (Signed)
Rounding Note    Patient Name: DIANA GERSHON Date of Encounter: 04/06/2023  [facility] Cardiologist: Arvilla Meres, MD   Subjective  He feels well, ready for inpatient rehab  Clinically improving Hgb up from 8 to 10 Crt 2.4->2.02 on milrinone I/O net negative 2.3L on lasix 40 mg IV BID Rate controlled flutter on amio Bps doing well on midodrine  Inpatient Medications    Scheduled Meds:  amiodarone  200 mg Oral Daily   amoxicillin-clavulanate  1 tablet Oral BID   apixaban  5 mg Oral BID   calamine  1 Application Topical TID   Chlorhexidine Gluconate Cloth  6 each Topical Daily   furosemide  40 mg Intravenous BID   hydrocortisone cream   Topical TID   influenza vaccine adjuvanted  0.5 mL Intramuscular Tomorrow-1000   insulin aspart  0-15 Units Subcutaneous TID WC   insulin aspart  0-5 Units Subcutaneous QHS   insulin glargine-yfgn  8 Units Subcutaneous Daily   midodrine  10 mg Oral TID WC   pneumococcal 20-valent conjugate vaccine  0.5 mL Intramuscular Tomorrow-1000   polyethylene glycol  17 g Oral Daily   [START ON 04/08/2023] predniSONE  10 mg Oral Q breakfast   [START ON 04/07/2023] predniSONE  20 mg Oral Q breakfast   senna-docusate  1 tablet Oral BID   sodium bicarbonate  650 mg Oral BID   sodium chloride flush  10-40 mL Intracatheter Q12H   sodium chloride flush  3 mL Intravenous Q12H   Continuous Infusions:  milrinone 0.125 mcg/kg/min (04/06/23 0217)   PRN Meds: albuterol, benzonatate, HYDROmorphone (DILAUDID) injection, hydrOXYzine, magic mouthwash, melatonin, ondansetron **OR** ondansetron (ZOFRAN) IV, oxyCODONE, sodium chloride flush, sodium chloride flush   Vital Signs    Vitals:   04/05/23 1952 04/05/23 2347 04/06/23 0450 04/06/23 0819  BP: 124/65 120/63 111/71 129/70  Pulse: 88 91 93   Resp: 13 16 19    Temp: 98.6 F (37 C) 98.7 F (37.1 C) 98.7 F (37.1 C) 98.1 F (36.7 C)  TempSrc: Oral Oral Oral Oral  SpO2: 96% 96% 95%    Weight:   89.9 kg   Height:        Intake/Output Summary (Last 24 hours) at 04/06/2023 0836 Last data filed at 04/06/2023 0820 Gross per 24 hour  Intake 360 ml  Output 1650 ml  Net -1290 ml      04/06/2023    4:50 AM 04/05/2023    6:26 AM 04/04/2023    5:05 AM  Last 3 Weights  Weight (lbs) 198 lb 3.1 oz 198 lb 6.6 oz 201 lb 15.1 oz  Weight (kg) 89.9 kg 90 kg 91.6 kg      Telemetry    Rate controlled atypical atrial flutter- Personally Reviewed  Physical Exam   GEN: Well nourished, well developed in no acute distress. O2 by nasal cannula NECK: JVD at clavicle at 45 degrees CARDIAC: regular rhythm, normal S1 and S2, no rubs or gallops. 2/6 systolic murmur. VASCULAR: Radial pulses 2+ bilaterally.  RESPIRATORY:  Diffusely coarse, diminished at bilateral bases but slowly improving ABDOMEN: Soft, non-tender, non-distended MUSCULOSKELETAL:  Moves all 4 limbs independently SKIN: Warm and dry, no LLE edema, improving RLE edema NEUROLOGIC:  No focal neuro deficits noted. PSYCHIATRIC:  Normal affect   Skin: diffuse erythematous papules across body, less erythematous than prior  New pertinent results (labs, ECG, imaging, cardiac studies)    Summarized below  Patient Profile     77 y.o. male with  hx of COPD, type 2 diabetes, coronary artery disease status post CABG in 2018 with stage D systolic heart failure status post CCM, hypertension, history of bladder cancer and paroxysmal atrial fibrillation, admitted 11/26 with large retroperitoneal hematoma (home Eliquis now held) and shortness of breath. Treated for CAP, started on milrinone this admission for cardiorenal syndrome  Assessment & Plan    Acute on chronic systolic and diastolic heart failure Ischemic cardiomyopathy Hypotension AKI on CKD stage 3b Cardiorenal syndrome -EF 20-25% -nephrology following, no acute indications for HD -no sglt2i due to yeast infections -hyperkalemia/gynecomastia on spironolactone -on  midodrine, no BP room for GDMT -Crt is improving, baseline ~2, minimal urine output with lasix prior to milrinone. -cont 0.125 mcg/kg/min, If crt continues to be stable, would stop milrinone in the AM 12/8. -CoOx is normal -blood pressure stable on milrinone, continue midodrine for now. -admission weight 89.5 kg, peak weight 94.9 kg 12/2, weight today 89 kg -has not had significant ectopy on milrinone   RLE edema -negative for DVT -may be in the setting of venous/lymphatic compression from RP hematoma given right sided location of hematoma (with notable iliopsoas and iliacus intramuscular hemorrhage) -continues to improve   Atrial flutter -DCCV 11/7, but anticoagulation held on admission given RP bleed; now restarted - back in flutter rate controlled. Cannot cardiovert with AC interruption -continue amiodarone  RP hematoma- stable -re-imaged with CT 12/4, fluid collections largely unchanged, possibly slightly larger size of collection anterior to right femoral head    CAD History of 2V CABG 2018 -atorvastatin on hold given elevated LFTs, likely 2/2 congestive hepatopathy. Restart statin once LFTs normalized. -no aspirin long term as he is on DOAC  Rash Concern for infection  -has been started on hydroxyzine and topical hydrocortisone as well as solumedrol -WBC continued to rise, procalcitonin mid range -completed ceftriaxone/azithromycin for CAP earlier this admission, s/p zosyn per primary team - still needs to wean O2, encouraged OOB to chair  Severe deconditioning -cannot even sit up all the way in bed without assistance. Working with PT. Agree with rehab post discharge.   Time Spent Directly with Patient:  I have spent a total of 35 minutes with the patient reviewing hospital notes, telemetry, EKGs, labs and examining the patient as well as establishing an assessment and plan that was discussed personally with the patient.       Signed, Maisie Fus, MD  04/06/2023,  8:36 AM

## 2023-04-06 NOTE — Progress Notes (Signed)
Patient ID: [name] [name], male   DOB: 08-16-1945, 77 y.o.   MRN: [id number] [facility] Progress Note   Assessment/ Plan:   1. Acute kidney Injury on chronic kidney disease stage IIIb (baseline creatinine around 2): Underlying chronic kidney disease suspected to be cardiorenal with acute injury likely from ischemic ATN in the setting of hypotension with ongoing ARB use (Entresto).  No evidence of obstruction associated with retroperitoneal hematoma.  With decent/fixed urine output on furosemide and milrinone and improving renal function seen on labs this morning.  Will convert to oral diuretics after he is weaned off of milrinone.  No indication for HD. 2.  Fall/right retroperitoneal hematoma: Developed traumatic multilocular acute versus subacute retroperitoneal hematoma with intramuscular hemorrhage within the right iliacus muscle.  Apixaban transiently held and has been restarted yesterday. 3.  Acute exacerbation of chronic systolic heart failure: Torsemide and Entresto originally held in the setting of acute kidney injury and with good diuresis/improving renal function following initiation of milrinone.  He informs me that cardiology plan to wean off milrinone today. 4.  Anemia: Mild and secondary to underlying chronic kidney disease/chronic illness with exacerbation due to retroperitoneal hemorrhage.  No indication for PRBC transfusion. 5.  Atrial fibrillation: Currently in sinus rhythm that is rate controlled while on amiodarone. 6.  Skin rash: Currently treating as an allergic skin reaction/pruritus with topical/systemic management.  Subjective:   Reports that he continues to feel stronger and was able to transfer well yesterday.  Wife reports that he walked around hallway with assistance the day before.   Objective:   BP 129/70 (BP Location: Left Arm)   Pulse 93   Temp 98.1 F (36.7 C) (Oral)   Resp 19   Ht 6\' 2"  (1.88 m)   Wt 89.9 kg   SpO2 95%   BMI 25.45 kg/m    Intake/Output Summary (Last 24 hours) at 04/06/2023 3086 Last data filed at 04/06/2023 0820 Gross per 24 hour  Intake 360 ml  Output 1650 ml  Net -1290 ml   Weight change: -0.1 kg  Physical Exam: Gen: Resting comfortably in bed, wife at bedside CVS: Pulse regular rhythm, normal rate, S1 and S2 normal Resp: Distant breath sounds bilaterally, no distinct rales or rhonchi Abd: Soft, flat, nontender, bowel sounds normal Ext: Trace-1+ right lower extremity edema, no left lower extremity edema  Imaging: No results found.  Labs: BMET Recent Labs  Lab 03/31/23 0823 04/02/23 0214 04/03/23 0544 04/04/23 0600 04/05/23 0500 04/06/23 0500  NA 130* 131* 131* 132* 130* 133*  K 4.0 4.1 3.4* 4.2 3.9 4.0  CL 101 102 100 100 98 100  CO2 18* 20* 21* 25 20* 27  GLUCOSE 116* 196* 138* 250* 287* 127*  BUN 67* 65* 65* 63* 60* 57*  CREATININE 3.43* 2.58* 2.35* 2.23* 2.40* 2.02*  CALCIUM 8.3* 8.1* 8.0* 7.9* 7.8* 7.9*   CBC Recent Labs  Lab 04/03/23 0544 04/04/23 0600 04/05/23 0500 04/06/23 0500  WBC 27.8* 18.8* 21.1* 28.8*  HGB 9.0* 8.7* 8.5* 10.0*  HCT 27.6* 27.4* 26.2* 30.3*  MCV 80.9 81.1 80.1 80.8  PLT 803* 783* 711* 834*    Medications:     amiodarone  200 mg Oral Daily   amoxicillin-clavulanate  1 tablet Oral BID   apixaban  5 mg Oral BID   calamine  1 Application Topical TID   Chlorhexidine Gluconate Cloth  6 each Topical Daily   furosemide  40 mg Intravenous BID   hydrocortisone cream   Topical  TID   influenza vaccine adjuvanted  0.5 mL Intramuscular Tomorrow-1000   insulin aspart  0-15 Units Subcutaneous TID WC   insulin aspart  0-5 Units Subcutaneous QHS   insulin glargine-yfgn  8 Units Subcutaneous Daily   midodrine  10 mg Oral TID WC   pneumococcal 20-valent conjugate vaccine  0.5 mL Intramuscular Tomorrow-1000   polyethylene glycol  17 g Oral Daily   [START ON 04/08/2023] predniSONE  10 mg Oral Q breakfast   [START ON 04/07/2023] predniSONE  20 mg Oral Q  breakfast   senna-docusate  1 tablet Oral BID   sodium bicarbonate  650 mg Oral BID   sodium chloride flush  10-40 mL Intracatheter Q12H   sodium chloride flush  3 mL Intravenous Q12H   Zetta Bills, MD 04/06/2023, 8:21 AM

## 2023-04-07 DIAGNOSIS — K683 Retroperitoneal hematoma: Secondary | ICD-10-CM | POA: Diagnosis not present

## 2023-04-07 DIAGNOSIS — I4892 Unspecified atrial flutter: Secondary | ICD-10-CM | POA: Diagnosis not present

## 2023-04-07 DIAGNOSIS — I5043 Acute on chronic combined systolic (congestive) and diastolic (congestive) heart failure: Secondary | ICD-10-CM | POA: Diagnosis not present

## 2023-04-07 LAB — CBC
HCT: 29.4 % — ABNORMAL LOW (ref 39.0–52.0)
Hemoglobin: 9.7 g/dL — ABNORMAL LOW (ref 13.0–17.0)
MCH: 26.5 pg (ref 26.0–34.0)
MCHC: 33 g/dL (ref 30.0–36.0)
MCV: 80.3 fL (ref 80.0–100.0)
Platelets: 738 10*3/uL — ABNORMAL HIGH (ref 150–400)
RBC: 3.66 MIL/uL — ABNORMAL LOW (ref 4.22–5.81)
RDW: 17.6 % — ABNORMAL HIGH (ref 11.5–15.5)
WBC: 28.3 10*3/uL — ABNORMAL HIGH (ref 4.0–10.5)
nRBC: 0 % (ref 0.0–0.2)

## 2023-04-07 LAB — CULTURE, BLOOD (ROUTINE X 2)
Culture: NO GROWTH
Culture: NO GROWTH

## 2023-04-07 LAB — BASIC METABOLIC PANEL
Anion gap: 10 (ref 5–15)
BUN: 53 mg/dL — ABNORMAL HIGH (ref 8–23)
CO2: 26 mmol/L (ref 22–32)
Calcium: 7.8 mg/dL — ABNORMAL LOW (ref 8.9–10.3)
Chloride: 98 mmol/L (ref 98–111)
Creatinine, Ser: 2.21 mg/dL — ABNORMAL HIGH (ref 0.61–1.24)
GFR, Estimated: 30 mL/min — ABNORMAL LOW (ref >60)
Glucose, Bld: 141 mg/dL — ABNORMAL HIGH (ref 70–99)
Potassium: 3.8 mmol/L (ref 3.5–5.1)
Sodium: 134 mmol/L — ABNORMAL LOW (ref 135–145)

## 2023-04-07 LAB — URINALYSIS, ROUTINE W REFLEX MICROSCOPIC
Bilirubin Urine: NEGATIVE
Glucose, UA: NEGATIVE mg/dL
Hgb urine dipstick: NEGATIVE
Ketones, ur: NEGATIVE mg/dL
Leukocytes,Ua: NEGATIVE
Nitrite: NEGATIVE
Protein, ur: NEGATIVE mg/dL
Specific Gravity, Urine: 1.009 (ref 1.005–1.030)
pH: 5 (ref 5.0–8.0)

## 2023-04-07 LAB — GLUCOSE, CAPILLARY
Glucose-Capillary: 128 mg/dL — ABNORMAL HIGH (ref 70–99)
Glucose-Capillary: 243 mg/dL — ABNORMAL HIGH (ref 70–99)
Glucose-Capillary: 174 mg/dL — ABNORMAL HIGH (ref 70–99)
Glucose-Capillary: 174 mg/dL — ABNORMAL HIGH (ref 70–99)

## 2023-04-07 LAB — COOXEMETRY PANEL
Carboxyhemoglobin: 1.5 % (ref 0.5–1.5)
Methemoglobin: <0.7 % (ref 0.0–1.5)
O2 Saturation: 72.7 %
Total hemoglobin: 9.8 g/dL — ABNORMAL LOW (ref 12.0–16.0)

## 2023-04-07 MED ORDER — TORSEMIDE 20 MG PO TABS
20.0000 mg | ORAL_TABLET | Freq: Every day | ORAL | Status: DC
  Administered 2023-04-07 – 2023-04-08 (×2): 20 mg via ORAL
  Filled 2023-04-07 (×2): qty 1

## 2023-04-07 MED ORDER — CALAMINE EX LOTN
1.0000 | TOPICAL_LOTION | CUTANEOUS | Status: DC | PRN
  Filled 2023-04-07: qty 177

## 2023-04-07 MED ORDER — HYDROCORTISONE 1 % EX CREA
TOPICAL_CREAM | Freq: Three times a day (TID) | CUTANEOUS | Status: DC | PRN
  Filled 2023-04-07: qty 28

## 2023-04-07 NOTE — Progress Notes (Signed)
PROGRESS NOTE  RESTON HOCKER  QMV:[id number] DOB: 1946/01/03 DOA: 03/25/2023 PCP: [name], MD   Brief Narrative: Patient is a 77 year old male with history of hypertension, coronary artery disease, paroxysmal A-fib/flutter, chronic systolic CHF, status post pacemaker placement, peripheral vascular disease, diabetes type 2, bladder cancer, CKD stage IIIb who presented from home with complaint of hip pain after the fall.  He was also admitted on 03/05/2023 and was discharged on 11/24 after he presented with fall at home, found to have right distal clavicle fracture that was managed nonoperatively.  Patient reported worsening progressive right hip pain for last 2 days, unable to ambulate.  On presentation, he was hemodynamically stable.  Lab work showed WBC count of 14.2, creatinine of 1.9, hemoglobin of 10.9.  CT hip showed evidence of right retroperitoneal hematoma extending into the distal right psoas and right iliacus muscle.  CT chest also showed multifocal groundglass opacities bilaterally.  Started on antibiotics for possible PNA.  Cardiology consulted for volume overload and nephrology consulted for worsening renal function..  Due to severe systolic CHF and volume overload, started on milrinone on 12/2.  Now clinically improving.  Ultimate plan is to discharge him to CIR after stopping milrinone drip.  12/8: Vital stable, slight increase in creatinine to 2.21, persistent leukocytosis at 28.3.  Cardiology will stop milrinone today PT is recommending CIR  Assessment & Plan:  Principal Problem:   Retroperitoneal hematoma Active Problems:   Acute on chronic combined systolic and diastolic heart failure (HCC)   Stage 3b chronic kidney disease (HCC)   Low output heart failure (HCC)   AKI (acute kidney injury) (HCC)   Acute on Chronic systolic congestive heart failure:   Status post AICD.  Home torsemide,Entresto on hold. Last echo done on 11/7 showed EF of 20 to 25%.  Elevated  BNP. Hospital course remarkable for persistent volume overload.  Started on IV Lasix and milrinone after PICC line placement.  Volume status improving.  Cardiology planning to stop milrinone today.  AKI on CKD stage IIIb: Baseline creatinine around 2.5.  Patient did had some increase in creatinine which has been improved and stable now. He follows with [facility], nephrology also consulted, given need of persistent diuresis with IV medications and worsening kidney function. Currently on IV Lasix.  Creatinine trending down and stable  Fall/retroperitoneal hematoma:CT right hip showed Retroperitoneal hematoma extending into the distal right psoas and right iliacus muscle. CT imaging also showed multilocular acute to subacute retroperitoneal hematoma with a component noted superiorly along the posterolateral abdominal wall measuring 4.4 x 2.6 cm. Intramuscular hemorrhage within the right iliacus muscle with the dominant collection measuring 4.5 x 8.1 cm.  Continue pain management, supportive care.  This is most likely secondary to fall.  PT/OT consulted, CIR recommended.  Patient lives with his wife, ambulates with help of walker but has not been able to use it recently. Case was discussed with general surgery by ED physician, no surgical intervention recommended.  Community acquired pneumonia: CT imaging showed multilevel groundglass infiltrates bilaterally.currently stable.  Continue oral antibiotics to finish the course.  Right lower extremity swelling: Venous Doppler did not show any DVT  Elevated liver enzymes: Mild, continue to monitor.  Unclear etiology.  Hepatitis panel negative.  Likely from chronic hepatic congestion from severe CHF  Leukocytosis: Unclear etiology.  Chronic.  On his last admission also, he had leukocytosis in the range of 20K .No fever  Denies any abdominal pain.  Unremarkable UA and chest x-ray.  Blood culture sent:NGTD.  Procalcitonin reassuring. Abdomen CT  abdomen/pelvis did not show any acute findings except for right iliopsoas musculature which could be bursitis or hematoma.  Started on empiric Zosyn now changed to Augmentin.  Leukocytosis probably worsened because of steroids.  Anticipating gradual improvement.  Currently seems stable.  Atrial flutter/fibrillation: Monitor on telemetry.  Eliquis resumed.    Follows with cardiology as an outpatient.  Currently in normal sinus rhythm.HR well controlled  Hypertension: Currently blood pressure soft.  Hold torsemide, Entresto.  Continue midodrine  Coronary artery disease: Lipitor on hold due to elevated enzymes.  No anginal symptoms.  Type 2 diabetes: Monitor blood sugars, continue sliding scale.A1c of 6.9.Started on insulin because he is on steroids.  Rash: Developed itch maculopapular rash on his chrst, back and buttocks.  Unclear etiology.  Might have some drug reaction but not sure which one.  Ordered calamine lotion, Solu-Medrol, hydroxyzine as needed.Rash continues to improve,now almost resolved.  Tapered steroid to oral  Lung nodule: CT imaging showed 16 mm nodule within the basilar right middle lobe, indeterminate. Repeat CT in  3 months or PET -CT     DVT prophylaxis:Place and maintain sequential compression device Start: 03/26/23 1608 apixaban (ELIQUIS) tablet 5 mg     Code Status: Full Code  Family Communication: Wife at bedside Patient status:Inpatient  Patient is from :Home  Anticipated discharge to:CIR  Estimated DC date:after cardiology clearance , needs to stop milrinone drip.   Consultants: Cardiology, nephrology  Procedures:None  Antimicrobials:  Anti-infectives (From admission, onward)    Start     Dose/Rate Route Frequency Ordered Stop   04/05/23 1230  amoxicillin-clavulanate (AUGMENTIN) 500-125 MG per tablet 1 tablet        1 tablet Oral 2 times daily 04/05/23 1130 04/10/23 0959   04/04/23 0200  piperacillin-tazobactam (ZOSYN) IVPB 3.375 g  Status:   Discontinued        3.375 g 12.5 mL/hr over 240 Minutes Intravenous Every 8 hours 04/03/23 1912 04/05/23 1130   04/03/23 1845  piperacillin-tazobactam (ZOSYN) IVPB 3.375 g        3.375 g 100 mL/hr over 30 Minutes Intravenous  Once 04/03/23 1754 04/04/23 1000   03/27/23 0600  cefTRIAXone (ROCEPHIN) 1 g in sodium chloride 0.9 % 100 mL IVPB        1 g 200 mL/hr over 30 Minutes Intravenous Every 24 hours 03/26/23 0821 03/30/23 0549   03/27/23 0600  azithromycin (ZITHROMAX) 500 mg in sodium chloride 0.9 % 250 mL IVPB  Status:  Discontinued        500 mg 250 mL/hr over 60 Minutes Intravenous Every 24 hours 03/26/23 0821 03/28/23 0934   03/26/23 0615  cefTRIAXone (ROCEPHIN) 1 g in sodium chloride 0.9 % 100 mL IVPB        1 g 200 mL/hr over 30 Minutes Intravenous  Once 03/26/23 0600 03/26/23 0744   03/26/23 0615  azithromycin (ZITHROMAX) tablet 500 mg        500 mg Oral  Once 03/26/23 0600 03/26/23 0612       Subjective: Patient was seen and examined today.  No new concern.  He was happy that he is getting closer to go to CIR.   Objective: Vitals:   04/06/23 1518 04/06/23 1946 04/06/23 2312 04/07/23 0559  BP: 127/63 (!) 151/70 123/75 127/67  Pulse:  94 94 97  Resp: 18 20 14 14   Temp: 97.6 F (36.4 C) 97.6 F (36.4 C) 97.6 F (36.4 C) 97.7 F (36.5  C)  TempSrc: Oral Oral Oral Oral  SpO2:  95% 98% 98%  Weight:    87.7 kg  Height:        Intake/Output Summary (Last 24 hours) at 04/07/2023 0803 Last data filed at 04/07/2023 0341 Gross per 24 hour  Intake 120 ml  Output 125 ml  Net -5 ml   Filed Weights   04/05/23 0626 04/06/23 0450 04/07/23 0559  Weight: 90 kg 89.9 kg 87.7 kg    Examination:  General.  Well-developed elderly man, in no acute distress. Pulmonary.  Lungs clear bilaterally, normal respiratory effort. CV.  Regular rate and rhythm, no JVD, rub or murmur. Abdomen.  Soft, nontender, nondistended, BS positive. CNS.  Alert and oriented .  No focal neurologic  deficit. Extremities.  No edema, no cyanosis, pulses intact and symmetrical. Psychiatry.  Judgment and insight appears normal.   Data Reviewed: I have personally reviewed following labs and imaging studies  CBC: Recent Labs  Lab 04/03/23 0544 04/04/23 0600 04/05/23 0500 04/06/23 0500 04/07/23 0425  WBC 27.8* 18.8* 21.1* 28.8* 28.3*  HGB 9.0* 8.7* 8.5* 10.0* 9.7*  HCT 27.6* 27.4* 26.2* 30.3* 29.4*  MCV 80.9 81.1 80.1 80.8 80.3  PLT 803* 783* 711* 834* 738*   Basic Metabolic Panel: Recent Labs  Lab 04/02/23 0214 04/03/23 0544 04/04/23 0600 04/05/23 0500 04/06/23 0500 04/07/23 0425  NA 131* 131* 132* 130* 133* 134*  K 4.1 3.4* 4.2 3.9 4.0 3.8  CL 102 100 100 98 100 98  CO2 20* 21* 25 20* 27 26  GLUCOSE 196* 138* 250* 287* 127* 141*  BUN 65* 65* 63* 60* 57* 53*  CREATININE 2.58* 2.35* 2.23* 2.40* 2.02* 2.21*  CALCIUM 8.1* 8.0* 7.9* 7.8* 7.9* 7.8*  MG 2.1 2.1 2.0 2.0 2.0  --      Recent Results (from the past 240 hour(s))  Culture, blood (Routine X 2) w Reflex to ID Panel     Status: None (Preliminary result)   Collection Time: 04/02/23  9:26 AM   Specimen: BLOOD  Result Value Ref Range Status   Specimen Description BLOOD BLOOD RIGHT HAND  Final   Special Requests   Final    BOTTLES DRAWN AEROBIC AND ANAEROBIC Blood Culture results may not be optimal due to an inadequate volume of blood received in culture bottles   Culture   Final    NO GROWTH 4 DAYS Performed at [facility], [address], [place], [place] [address]    Report Status PENDING  Incomplete  Culture, blood (Routine X 2) w Reflex to ID Panel     Status: None (Preliminary result)   Collection Time: 04/02/23  9:26 AM   Specimen: BLOOD  Result Value Ref Range Status   Specimen Description BLOOD BLOOD RIGHT HAND  Final   Special Requests   Final    BOTTLES DRAWN AEROBIC AND ANAEROBIC Blood Culture results may not be optimal due to an inadequate volume of blood received in culture bottles    Culture   Final    NO GROWTH 4 DAYS Performed at [facility], [address], [place], [place] [address]    Report Status PENDING  Incomplete     Radiology Studies: No results found.  Scheduled Meds:  amiodarone  200 mg Oral Daily   amoxicillin-clavulanate  1 tablet Oral BID   apixaban  5 mg Oral BID   calamine  1 Application Topical TID   Chlorhexidine Gluconate Cloth  6 each Topical Daily  furosemide  40 mg Intravenous BID   hydrocortisone cream   Topical TID   influenza vaccine adjuvanted  0.5 mL Intramuscular Tomorrow-1000   insulin aspart  0-15 Units Subcutaneous TID WC   insulin aspart  0-5 Units Subcutaneous QHS   midodrine  10 mg Oral TID WC   pneumococcal 20-valent conjugate vaccine  0.5 mL Intramuscular Tomorrow-1000   polyethylene glycol  17 g Oral Daily   senna-docusate  1 tablet Oral BID   sodium bicarbonate  650 mg Oral BID   sodium chloride flush  10-40 mL Intracatheter Q12H   sodium chloride flush  3 mL Intravenous Q12H   Continuous Infusions:  milrinone 0.125 mcg/kg/min (04/07/23 0341)      LOS: 12 days   Arnetha Courser, MD [facility] P12/11/2022, 8:03 AM

## 2023-04-07 NOTE — Progress Notes (Signed)
Rounding Note    Patient Name: [name] [name] Date of Encounter: 04/07/2023  [facility] Cardiologist: Arvilla Meres, MD   Subjective  Today he feels a bit dehydrated and deconditioned.  He is excited to get to inpatient rehab  Clinically improving Hgb up from 8 to 10 Crt 2.4->2.02->2.21 on milrinone I/O net negative 2.3L on lasix 40 mg IV BID Rate controlled flutter on amio Bps doing well on midodrine  Inpatient Medications    Scheduled Meds:  amiodarone  200 mg Oral Daily   amoxicillin-clavulanate  1 tablet Oral BID   apixaban  5 mg Oral BID   calamine  1 Application Topical TID   Chlorhexidine Gluconate Cloth  6 each Topical Daily   hydrocortisone cream   Topical TID   influenza vaccine adjuvanted  0.5 mL Intramuscular Tomorrow-1000   insulin aspart  0-15 Units Subcutaneous TID WC   insulin aspart  0-5 Units Subcutaneous QHS   midodrine  10 mg Oral TID WC   pneumococcal 20-valent conjugate vaccine  0.5 mL Intramuscular Tomorrow-1000   polyethylene glycol  17 g Oral Daily   senna-docusate  1 tablet Oral BID   sodium bicarbonate  650 mg Oral BID   sodium chloride flush  10-40 mL Intracatheter Q12H   sodium chloride flush  3 mL Intravenous Q12H   torsemide  20 mg Oral Daily   Continuous Infusions:  milrinone 0.125 mcg/kg/min (04/07/23 0341)   PRN Meds: albuterol, benzonatate, HYDROmorphone (DILAUDID) injection, hydrOXYzine, magic mouthwash, melatonin, ondansetron **OR** ondansetron (ZOFRAN) IV, oxyCODONE, sodium chloride flush, sodium chloride flush   Vital Signs    Vitals:   04/06/23 1518 04/06/23 1946 04/06/23 2312 04/07/23 0559  BP: 127/63 (!) 151/70 123/75 127/67  Pulse:  94 94 97  Resp: 18 20 14 14   Temp: 97.6 F (36.4 C) 97.6 F (36.4 C) 97.6 F (36.4 C) 97.7 F (36.5 C)  TempSrc: Oral Oral Oral Oral  SpO2:  95% 98% 98%  Weight:    87.7 kg  Height:        Intake/Output Summary (Last 24 hours) at 04/07/2023 0853 Last data filed at  04/07/2023 0341 Gross per 24 hour  Intake --  Output 125 ml  Net -125 ml      04/07/2023    5:59 AM 04/06/2023    4:50 AM 04/05/2023    6:26 AM  Last 3 Weights  Weight (lbs) 193 lb 5.5 oz 198 lb 3.1 oz 198 lb 6.6 oz  Weight (kg) 87.7 kg 89.9 kg 90 kg      Telemetry    Rate controlled atypical atrial flutter- Personally Reviewed  Physical Exam   GEN: Well nourished, well developed in no acute distress.  CARDIAC: regular rhythm, normal S1 and S2, no rubs or gallops RESPIRATORY:  normal wob, good airmovement ABDOMEN: Soft, non-tender, non-distended SKIN: warm and well perfused NEUROLOGIC:  No focal neuro deficits noted. PSYCHIATRIC:  Normal affect   EXT: no LE edema  New pertinent results (labs, ECG, imaging, cardiac studies)    Summarized below  Patient Profile     77 y.o. male with hx of COPD, type 2 diabetes, coronary artery disease status post CABG in 2018 with stage D systolic heart failure status post CCM, hypertension, history of bladder cancer and paroxysmal atrial fibrillation, admitted 11/26 with large retroperitoneal hematoma (home Eliquis now held) and shortness of breath. Treated for CAP, started on milrinone this admission for cardiorenal syndrome  Assessment & Plan    Acute  on chronic systolic and diastolic heart failure Ischemic cardiomyopathy Hypotension AKI on CKD stage 3b Cardiorenal syndrome -EF 20-25% -nephrology following, no acute indications for HD -no sglt2i due to yeast infections -hyperkalemia/gynecomastia on spironolactone - no BP room for GDMT; including his Entresto, recommend holding this on discharge -Crt is stable, baseline ~2; his co-ox's have been normal and he has been diuresing well and is clinically at his baseline.  O2 sat in the 90s without oxygen at the bedside.  Will stop his milrinone today. -Will transition his IV diuretic to his home oral dose -admission weight 89.5 kg, peak weight 94.9 kg 12/2, weight today 87kg    RLE  edema- resolved -negative for DVT -may be in the setting of venous/lymphatic compression from RP hematoma given right sided location of hematoma (with notable iliopsoas and iliacus intramuscular hemorrhage)    Atrial flutter-stable rates low 100s -DCCV 11/7, but anticoagulation held on admission given RP bleed; now restarted - back in flutter rate controlled. Cannot cardiovert with AC interruption -continue amiodarone  RP hematoma- stable -re-imaged with CT 12/4, fluid collections largely unchanged, possibly slightly larger size of collection anterior to right femoral head    CAD-stable History of 2V CABG 2018 -atorvastatin on hold given elevated LFTs, likely 2/2 congestive hepatopathy. Restart statin once LFTs normalized. -no aspirin long term as he is on DOAC  Rash Concern for infection -resolved -has been started on hydroxyzine and topical hydrocortisone as well as solumedrol -WBC continued to rise, procalcitonin mid range -completed ceftriaxone/azithromycin for CAP earlier this admission, s/p zosyn per primary team  Severe deconditioning -cannot even sit up all the way in bed without assistance. Working with PT. Agree with rehab post discharge.  With stopping his milrinone and IV diuresis he is in a good position to transition to inpatient rehab.  Cardiology will sign off; but do not hesitate to reach out for questions   Time Spent Directly with Patient:  I have spent a total of 35 minutes with the patient reviewing hospital notes, telemetry, EKGs, labs and examining the patient as well as establishing an assessment and plan that was discussed personally with the patient.       Signed, Maisie Fus, MD  04/07/2023, 8:53 AM

## 2023-04-07 NOTE — Plan of Care (Signed)
  Problem: Education: Goal: Knowledge of General Education information will improve Description: Including pain rating scale, medication(s)/side effects and non-pharmacologic comfort measures Outcome: Progressing   Problem: Clinical Measurements: Goal: Ability to maintain clinical measurements within normal limits will improve Outcome: Progressing Goal: Will remain free from infection Outcome: Progressing Goal: Respiratory complications will improve Outcome: Progressing   Problem: Nutrition: Goal: Adequate nutrition will be maintained Outcome: Progressing   

## 2023-04-07 NOTE — Progress Notes (Signed)
Patient ID: [name] [name], male   DOB: 06/04/1945, 77 y.o.   MRN: [id number] [facility] Progress Note   Assessment/ Plan:   1. Acute kidney Injury on chronic kidney disease stage IIIb (baseline creatinine around 2): Underlying chronic kidney disease suspected to be cardiorenal with acute injury likely from ischemic ATN in the setting of hypotension with ongoing ARB use (Entresto).  No evidence of obstruction associated with retroperitoneal hematoma.  Urine output appears to be incompletely charted from overnight but reassuringly weight is down by almost 2 kg this morning.  Furosemide dose decreased yesterday from 80 mg to 40 mg IV twice daily with continued milrinone drip.  Will transition to oral torsemide 40 mg twice daily when weaned off of milrinone. 2.  Fall/right retroperitoneal hematoma: Developed traumatic multilocular acute versus subacute retroperitoneal hematoma with intramuscular hemorrhage within the right iliacus muscle.  Apixaban transiently held and has been restarted yesterday. 3.  Acute exacerbation of chronic systolic heart failure: Torsemide and Entresto originally held in the setting of acute kidney injury and with good diuresis/improving renal function following initiation of milrinone.  Wean/discontinue milrinone per cardiology. 4.  Anemia: Mild and secondary to underlying chronic kidney disease/chronic illness with exacerbation due to retroperitoneal hemorrhage.  No indication for PRBC transfusion. 5.  Atrial fibrillation: Currently in sinus rhythm that is rate controlled while on amiodarone. 6.  Skin rash: Currently treating as an allergic skin reaction/pruritus with topical/systemic management.  Subjective:   Reports an uncomfortable night of sleep because of "overheating".  Having some right leg/thigh aching this morning.  He has noted a decrease in his urine output.   Objective:   BP 127/67 (BP Location: Left Arm)   Pulse 97   Temp 97.7 F (36.5 C)  (Oral)   Resp 14   Ht 6\' 2"  (1.88 m)   Wt 87.7 kg Comment: pt refused to stand opted for bed weight  SpO2 98%   BMI 24.82 kg/m   Intake/Output Summary (Last 24 hours) at 04/07/2023 0825 Last data filed at 04/07/2023 0341 Gross per 24 hour  Intake --  Output 125 ml  Net -125 ml   Weight change: -2.2 kg  Physical Exam: Gen: Sitting up comfortably in recliner CVS: Pulse regular rhythm, normal rate, S1 and S2 normal Resp: Distant breath sounds bilaterally, no distinct rales or rhonchi Abd: Soft, flat, nontender, bowel sounds normal Ext: Trace right lower extremity edema, no left lower extremity edema  Imaging: No results found.  Labs: BMET Recent Labs  Lab 04/02/23 0214 04/03/23 0544 04/04/23 0600 04/05/23 0500 04/06/23 0500 04/07/23 0425  NA 131* 131* 132* 130* 133* 134*  K 4.1 3.4* 4.2 3.9 4.0 3.8  CL 102 100 100 98 100 98  CO2 20* 21* 25 20* 27 26  GLUCOSE 196* 138* 250* 287* 127* 141*  BUN 65* 65* 63* 60* 57* 53*  CREATININE 2.58* 2.35* 2.23* 2.40* 2.02* 2.21*  CALCIUM 8.1* 8.0* 7.9* 7.8* 7.9* 7.8*   CBC Recent Labs  Lab 04/04/23 0600 04/05/23 0500 04/06/23 0500 04/07/23 0425  WBC 18.8* 21.1* 28.8* 28.3*  HGB 8.7* 8.5* 10.0* 9.7*  HCT 27.4* 26.2* 30.3* 29.4*  MCV 81.1 80.1 80.8 80.3  PLT 783* 711* 834* 738*    Medications:     amiodarone  200 mg Oral Daily   amoxicillin-clavulanate  1 tablet Oral BID   apixaban  5 mg Oral BID   calamine  1 Application Topical TID   Chlorhexidine Gluconate Cloth  6 each Topical  Daily   furosemide  40 mg Intravenous BID   hydrocortisone cream   Topical TID   influenza vaccine adjuvanted  0.5 mL Intramuscular Tomorrow-1000   insulin aspart  0-15 Units Subcutaneous TID WC   insulin aspart  0-5 Units Subcutaneous QHS   midodrine  10 mg Oral TID WC   pneumococcal 20-valent conjugate vaccine  0.5 mL Intramuscular Tomorrow-1000   polyethylene glycol  17 g Oral Daily   senna-docusate  1 tablet Oral BID   sodium  bicarbonate  650 mg Oral BID   sodium chloride flush  10-40 mL Intracatheter Q12H   sodium chloride flush  3 mL Intravenous Q12H   Zetta Bills, MD 04/07/2023, 8:25 AM

## 2023-04-08 ENCOUNTER — Inpatient Hospital Stay (HOSPITAL_COMMUNITY)
Admission: AD | Admit: 2023-04-08 | Discharge: 2023-04-20 | DRG: 291 | Disposition: A | Discharge status: Other Acute Inpatient Hospital | Payer: Medicare Other | Source: Intra-hospital | Attending: Physical Medicine and Rehabilitation | Admitting: Physical Medicine and Rehabilitation

## 2023-04-08 ENCOUNTER — Other Ambulatory Visit: Payer: Self-pay

## 2023-04-08 ENCOUNTER — Inpatient Hospital Stay (HOSPITAL_COMMUNITY): Payer: Medicare Other

## 2023-04-08 ENCOUNTER — Encounter (HOSPITAL_COMMUNITY): Payer: Self-pay | Admitting: Physical Medicine and Rehabilitation

## 2023-04-08 DIAGNOSIS — Z9181 History of falling: Secondary | ICD-10-CM

## 2023-04-08 DIAGNOSIS — I5020 Unspecified systolic (congestive) heart failure: Secondary | ICD-10-CM | POA: Diagnosis not present

## 2023-04-08 DIAGNOSIS — T380X5A Adverse effect of glucocorticoids and synthetic analogues, initial encounter: Secondary | ICD-10-CM | POA: Diagnosis present

## 2023-04-08 DIAGNOSIS — G3184 Mild cognitive impairment, so stated: Secondary | ICD-10-CM | POA: Diagnosis present

## 2023-04-08 DIAGNOSIS — R7401 Elevation of levels of liver transaminase levels: Secondary | ICD-10-CM | POA: Diagnosis present

## 2023-04-08 DIAGNOSIS — Z888 Allergy status to other drugs, medicaments and biological substances status: Secondary | ICD-10-CM

## 2023-04-08 DIAGNOSIS — L89150 Pressure ulcer of sacral region, unstageable: Secondary | ICD-10-CM | POA: Diagnosis present

## 2023-04-08 DIAGNOSIS — I130 Hypertensive heart and chronic kidney disease with heart failure and stage 1 through stage 4 chronic kidney disease, or unspecified chronic kidney disease: Secondary | ICD-10-CM | POA: Diagnosis present

## 2023-04-08 DIAGNOSIS — J449 Chronic obstructive pulmonary disease, unspecified: Secondary | ICD-10-CM | POA: Diagnosis present

## 2023-04-08 DIAGNOSIS — G4700 Insomnia, unspecified: Secondary | ICD-10-CM | POA: Diagnosis not present

## 2023-04-08 DIAGNOSIS — L89512 Pressure ulcer of right ankle, stage 2: Secondary | ICD-10-CM | POA: Diagnosis present

## 2023-04-08 DIAGNOSIS — E8770 Fluid overload, unspecified: Secondary | ICD-10-CM | POA: Diagnosis present

## 2023-04-08 DIAGNOSIS — I483 Typical atrial flutter: Secondary | ICD-10-CM | POA: Diagnosis not present

## 2023-04-08 DIAGNOSIS — Z515 Encounter for palliative care: Secondary | ICD-10-CM | POA: Diagnosis not present

## 2023-04-08 DIAGNOSIS — L89626 Pressure-induced deep tissue damage of left heel: Secondary | ICD-10-CM | POA: Diagnosis present

## 2023-04-08 DIAGNOSIS — W19XXXD Unspecified fall, subsequent encounter: Secondary | ICD-10-CM | POA: Diagnosis present

## 2023-04-08 DIAGNOSIS — I10 Essential (primary) hypertension: Secondary | ICD-10-CM | POA: Diagnosis not present

## 2023-04-08 DIAGNOSIS — M79661 Pain in right lower leg: Secondary | ICD-10-CM | POA: Diagnosis not present

## 2023-04-08 DIAGNOSIS — I484 Atypical atrial flutter: Secondary | ICD-10-CM | POA: Diagnosis present

## 2023-04-08 DIAGNOSIS — K5900 Constipation, unspecified: Secondary | ICD-10-CM | POA: Diagnosis not present

## 2023-04-08 DIAGNOSIS — E871 Hypo-osmolality and hyponatremia: Secondary | ICD-10-CM | POA: Diagnosis present

## 2023-04-08 DIAGNOSIS — G5711 Meralgia paresthetica, right lower limb: Secondary | ICD-10-CM | POA: Diagnosis present

## 2023-04-08 DIAGNOSIS — I5084 End stage heart failure: Secondary | ICD-10-CM | POA: Diagnosis present

## 2023-04-08 DIAGNOSIS — K683 Retroperitoneal hematoma: Secondary | ICD-10-CM | POA: Diagnosis present

## 2023-04-08 DIAGNOSIS — Z8551 Personal history of malignant neoplasm of bladder: Secondary | ICD-10-CM

## 2023-04-08 DIAGNOSIS — E11621 Type 2 diabetes mellitus with foot ulcer: Secondary | ICD-10-CM | POA: Diagnosis present

## 2023-04-08 DIAGNOSIS — M25511 Pain in right shoulder: Secondary | ICD-10-CM | POA: Diagnosis present

## 2023-04-08 DIAGNOSIS — H40009 Preglaucoma, unspecified, unspecified eye: Secondary | ICD-10-CM | POA: Diagnosis present

## 2023-04-08 DIAGNOSIS — Z885 Allergy status to narcotic agent status: Secondary | ICD-10-CM

## 2023-04-08 DIAGNOSIS — I5043 Acute on chronic combined systolic (congestive) and diastolic (congestive) heart failure: Secondary | ICD-10-CM | POA: Diagnosis present

## 2023-04-08 DIAGNOSIS — M79651 Pain in right thigh: Secondary | ICD-10-CM

## 2023-04-08 DIAGNOSIS — R42 Dizziness and giddiness: Secondary | ICD-10-CM | POA: Diagnosis not present

## 2023-04-08 DIAGNOSIS — I5023 Acute on chronic systolic (congestive) heart failure: Secondary | ICD-10-CM | POA: Diagnosis not present

## 2023-04-08 DIAGNOSIS — Z79899 Other long term (current) drug therapy: Secondary | ICD-10-CM

## 2023-04-08 DIAGNOSIS — E1122 Type 2 diabetes mellitus with diabetic chronic kidney disease: Secondary | ICD-10-CM | POA: Diagnosis present

## 2023-04-08 DIAGNOSIS — M7989 Other specified soft tissue disorders: Secondary | ICD-10-CM

## 2023-04-08 DIAGNOSIS — R5381 Other malaise: Principal | ICD-10-CM | POA: Diagnosis present

## 2023-04-08 DIAGNOSIS — R21 Rash and other nonspecific skin eruption: Secondary | ICD-10-CM | POA: Diagnosis present

## 2023-04-08 DIAGNOSIS — D649 Anemia, unspecified: Secondary | ICD-10-CM | POA: Diagnosis present

## 2023-04-08 DIAGNOSIS — N1832 Chronic kidney disease, stage 3b: Secondary | ICD-10-CM | POA: Diagnosis present

## 2023-04-08 DIAGNOSIS — F410 Panic disorder [episodic paroxysmal anxiety] without agoraphobia: Secondary | ICD-10-CM | POA: Diagnosis present

## 2023-04-08 DIAGNOSIS — R570 Cardiogenic shock: Secondary | ICD-10-CM | POA: Diagnosis not present

## 2023-04-08 DIAGNOSIS — I2510 Atherosclerotic heart disease of native coronary artery without angina pectoris: Secondary | ICD-10-CM | POA: Diagnosis present

## 2023-04-08 DIAGNOSIS — Z8249 Family history of ischemic heart disease and other diseases of the circulatory system: Secondary | ICD-10-CM

## 2023-04-08 DIAGNOSIS — Z66 Do not resuscitate: Secondary | ICD-10-CM | POA: Diagnosis not present

## 2023-04-08 DIAGNOSIS — R918 Other nonspecific abnormal finding of lung field: Secondary | ICD-10-CM | POA: Diagnosis present

## 2023-04-08 DIAGNOSIS — E1165 Type 2 diabetes mellitus with hyperglycemia: Secondary | ICD-10-CM | POA: Diagnosis present

## 2023-04-08 DIAGNOSIS — Z951 Presence of aortocoronary bypass graft: Secondary | ICD-10-CM

## 2023-04-08 DIAGNOSIS — E1151 Type 2 diabetes mellitus with diabetic peripheral angiopathy without gangrene: Secondary | ICD-10-CM | POA: Diagnosis present

## 2023-04-08 DIAGNOSIS — I4892 Unspecified atrial flutter: Secondary | ICD-10-CM | POA: Diagnosis not present

## 2023-04-08 DIAGNOSIS — D75838 Other thrombocytosis: Secondary | ICD-10-CM | POA: Diagnosis present

## 2023-04-08 DIAGNOSIS — L89610 Pressure ulcer of right heel, unstageable: Secondary | ICD-10-CM | POA: Diagnosis present

## 2023-04-08 DIAGNOSIS — K6812 Psoas muscle abscess: Secondary | ICD-10-CM | POA: Diagnosis not present

## 2023-04-08 DIAGNOSIS — N179 Acute kidney failure, unspecified: Secondary | ICD-10-CM | POA: Diagnosis present

## 2023-04-08 DIAGNOSIS — R296 Repeated falls: Secondary | ICD-10-CM | POA: Diagnosis present

## 2023-04-08 DIAGNOSIS — L299 Pruritus, unspecified: Secondary | ICD-10-CM | POA: Diagnosis present

## 2023-04-08 DIAGNOSIS — Z9889 Other specified postprocedural states: Secondary | ICD-10-CM

## 2023-04-08 DIAGNOSIS — Z87891 Personal history of nicotine dependence: Secondary | ICD-10-CM

## 2023-04-08 DIAGNOSIS — I480 Paroxysmal atrial fibrillation: Secondary | ICD-10-CM | POA: Diagnosis present

## 2023-04-08 DIAGNOSIS — D72829 Elevated white blood cell count, unspecified: Secondary | ICD-10-CM | POA: Diagnosis present

## 2023-04-08 DIAGNOSIS — I959 Hypotension, unspecified: Secondary | ICD-10-CM | POA: Diagnosis present

## 2023-04-08 DIAGNOSIS — Z95810 Presence of automatic (implantable) cardiac defibrillator: Secondary | ICD-10-CM

## 2023-04-08 DIAGNOSIS — Z91119 Patient's noncompliance with dietary regimen due to unspecified reason: Secondary | ICD-10-CM

## 2023-04-08 DIAGNOSIS — I951 Orthostatic hypotension: Secondary | ICD-10-CM | POA: Diagnosis not present

## 2023-04-08 DIAGNOSIS — Z9079 Acquired absence of other genital organ(s): Secondary | ICD-10-CM

## 2023-04-08 DIAGNOSIS — Z7901 Long term (current) use of anticoagulants: Secondary | ICD-10-CM

## 2023-04-08 DIAGNOSIS — K761 Chronic passive congestion of liver: Secondary | ICD-10-CM | POA: Diagnosis present

## 2023-04-08 DIAGNOSIS — R911 Solitary pulmonary nodule: Secondary | ICD-10-CM | POA: Diagnosis present

## 2023-04-08 DIAGNOSIS — Z8701 Personal history of pneumonia (recurrent): Secondary | ICD-10-CM

## 2023-04-08 DIAGNOSIS — I4891 Unspecified atrial fibrillation: Secondary | ICD-10-CM | POA: Diagnosis not present

## 2023-04-08 DIAGNOSIS — Z9049 Acquired absence of other specified parts of digestive tract: Secondary | ICD-10-CM

## 2023-04-08 DIAGNOSIS — M25551 Pain in right hip: Secondary | ICD-10-CM | POA: Diagnosis present

## 2023-04-08 DIAGNOSIS — S4991XD Unspecified injury of right shoulder and upper arm, subsequent encounter: Secondary | ICD-10-CM

## 2023-04-08 DIAGNOSIS — Z7189 Other specified counseling: Secondary | ICD-10-CM | POA: Diagnosis not present

## 2023-04-08 LAB — CBC
HCT: 32 % — ABNORMAL LOW (ref 39.0–52.0)
Hemoglobin: 10.4 g/dL — ABNORMAL LOW (ref 13.0–17.0)
MCH: 25.9 pg — ABNORMAL LOW (ref 26.0–34.0)
MCHC: 32.5 g/dL (ref 30.0–36.0)
MCV: 79.6 fL — ABNORMAL LOW (ref 80.0–100.0)
Platelets: 706 10*3/uL — ABNORMAL HIGH (ref 150–400)
RBC: 4.02 MIL/uL — ABNORMAL LOW (ref 4.22–5.81)
RDW: 17.9 % — ABNORMAL HIGH (ref 11.5–15.5)
WBC: 37.8 10*3/uL — ABNORMAL HIGH (ref 4.0–10.5)
nRBC: 0 % (ref 0.0–0.2)

## 2023-04-08 LAB — GLUCOSE, CAPILLARY
Glucose-Capillary: 142 mg/dL — ABNORMAL HIGH (ref 70–99)
Glucose-Capillary: 235 mg/dL — ABNORMAL HIGH (ref 70–99)
Glucose-Capillary: 134 mg/dL — ABNORMAL HIGH (ref 70–99)
Glucose-Capillary: 245 mg/dL — ABNORMAL HIGH (ref 70–99)

## 2023-04-08 LAB — BASIC METABOLIC PANEL
Anion gap: 11 (ref 5–15)
BUN: 47 mg/dL — ABNORMAL HIGH (ref 8–23)
CO2: 26 mmol/L (ref 22–32)
Calcium: 7.7 mg/dL — ABNORMAL LOW (ref 8.9–10.3)
Chloride: 96 mmol/L — ABNORMAL LOW (ref 98–111)
Creatinine, Ser: 2.1 mg/dL — ABNORMAL HIGH (ref 0.61–1.24)
GFR, Estimated: 32 mL/min — ABNORMAL LOW (ref >60)
Glucose, Bld: 164 mg/dL — ABNORMAL HIGH (ref 70–99)
Potassium: 3.5 mmol/L (ref 3.5–5.1)
Sodium: 133 mmol/L — ABNORMAL LOW (ref 135–145)

## 2023-04-08 MED ORDER — MELATONIN 5 MG PO TABS: 5.0000 mg | ORAL_TABLET | Freq: Every evening | ORAL | Status: DC | PRN

## 2023-04-08 MED ORDER — AMIODARONE HCL 200 MG PO TABS
200.0000 mg | ORAL_TABLET | Freq: Every day | ORAL | Status: DC
Start: 2023-04-09 — End: 2023-04-20
  Administered 2023-04-09 – 2023-04-20 (×12): 200 mg via ORAL
  Filled 2023-04-08 (×12): qty 1

## 2023-04-08 MED ORDER — OXYCODONE HCL 5 MG PO TABS: 5.0000 mg | ORAL_TABLET | ORAL | 0 refills | Status: DC | PRN

## 2023-04-08 MED ORDER — ONDANSETRON HCL 4 MG PO TABS
4.0000 mg | ORAL_TABLET | Freq: Four times a day (QID) | ORAL | Status: DC | PRN
Start: 2023-04-08 — End: 2023-04-20

## 2023-04-08 MED ORDER — INSULIN ASPART 100 UNIT/ML IJ SOLN
0.0000 [IU] | Freq: Three times a day (TID) | INTRAMUSCULAR | Status: DC
  Administered 2023-04-09: 5 [IU] via SUBCUTANEOUS
  Administered 2023-04-09: 2 [IU] via SUBCUTANEOUS
  Administered 2023-04-09 – 2023-04-10 (×3): 5 [IU] via SUBCUTANEOUS
  Administered 2023-04-10: 2 [IU] via SUBCUTANEOUS
  Administered 2023-04-11: 5 [IU] via SUBCUTANEOUS
  Administered 2023-04-11: 8 [IU] via SUBCUTANEOUS
  Administered 2023-04-12: 5 [IU] via SUBCUTANEOUS
  Administered 2023-04-12: 2 [IU] via SUBCUTANEOUS
  Administered 2023-04-12: 3 [IU] via SUBCUTANEOUS
  Administered 2023-04-13: 5 [IU] via SUBCUTANEOUS
  Administered 2023-04-13 – 2023-04-14 (×2): 3 [IU] via SUBCUTANEOUS
  Administered 2023-04-14: 5 [IU] via SUBCUTANEOUS
  Administered 2023-04-14: 2 [IU] via SUBCUTANEOUS
  Administered 2023-04-15: 3 [IU] via SUBCUTANEOUS
  Administered 2023-04-15: 2 [IU] via SUBCUTANEOUS
  Administered 2023-04-15 – 2023-04-16 (×2): 3 [IU] via SUBCUTANEOUS
  Administered 2023-04-16: 5 [IU] via SUBCUTANEOUS
  Administered 2023-04-17: 3 [IU] via SUBCUTANEOUS
  Administered 2023-04-17: 2 [IU] via SUBCUTANEOUS
  Administered 2023-04-17 – 2023-04-18 (×2): 3 [IU] via SUBCUTANEOUS
  Administered 2023-04-18 – 2023-04-19 (×2): 2 [IU] via SUBCUTANEOUS
  Administered 2023-04-19: 3 [IU] via SUBCUTANEOUS
  Administered 2023-04-20: 2 [IU] via SUBCUTANEOUS

## 2023-04-08 MED ORDER — AMOXICILLIN-POT CLAVULANATE 500-125 MG PO TABS
1.0000 | ORAL_TABLET | Freq: Two times a day (BID) | ORAL | Status: DC
  Administered 2023-04-08 – 2023-04-09 (×2): 1 via ORAL
  Filled 2023-04-08 (×3): qty 1

## 2023-04-08 MED ORDER — APIXABAN 5 MG PO TABS
5.0000 mg | ORAL_TABLET | Freq: Two times a day (BID) | ORAL | Status: DC
  Administered 2023-04-08 – 2023-04-20 (×24): 5 mg via ORAL
  Filled 2023-04-08 (×24): qty 1

## 2023-04-08 MED ORDER — SENNOSIDES-DOCUSATE SODIUM 8.6-50 MG PO TABS
1.0000 | ORAL_TABLET | Freq: Two times a day (BID) | ORAL | Status: DC
  Administered 2023-04-08 – 2023-04-18 (×19): 1 via ORAL
  Filled 2023-04-08 (×21): qty 1

## 2023-04-08 MED ORDER — AMIODARONE HCL 200 MG PO TABS: 200.0000 mg | ORAL_TABLET | Freq: Every day | ORAL | Status: DC

## 2023-04-08 MED ORDER — MIDODRINE HCL 10 MG PO TABS: 10.0000 mg | ORAL_TABLET | Freq: Three times a day (TID) | ORAL | 0 refills | Status: DC

## 2023-04-08 MED ORDER — METHOCARBAMOL 500 MG PO TABS
500.0000 mg | ORAL_TABLET | Freq: Four times a day (QID) | ORAL | Status: DC | PRN
  Administered 2023-04-10: 500 mg via ORAL
  Filled 2023-04-08: qty 1

## 2023-04-08 MED ORDER — SORBITOL 70 % SOLN
30.0000 mL | Freq: Every day | Status: DC | PRN
  Administered 2023-04-17: 30 mL via ORAL
  Filled 2023-04-08: qty 30

## 2023-04-08 MED ORDER — INSULIN ASPART 100 UNIT/ML IJ SOLN
0.0000 [IU] | Freq: Every day | INTRAMUSCULAR | Status: DC
  Administered 2023-04-08 – 2023-04-12 (×4): 2 [IU] via SUBCUTANEOUS

## 2023-04-08 MED ORDER — BENZONATATE 100 MG PO CAPS: 200.0000 mg | ORAL_CAPSULE | Freq: Three times a day (TID) | ORAL | Status: DC | PRN

## 2023-04-08 MED ORDER — ACETAMINOPHEN 325 MG PO TABS
325.0000 mg | ORAL_TABLET | ORAL | Status: DC | PRN
  Administered 2023-04-08: 650 mg via ORAL
  Filled 2023-04-08: qty 2

## 2023-04-08 MED ORDER — TORSEMIDE 20 MG PO TABS
20.0000 mg | ORAL_TABLET | Freq: Every day | ORAL | Status: DC
  Administered 2023-04-09 – 2023-04-20 (×12): 20 mg via ORAL
  Filled 2023-04-08 (×12): qty 1

## 2023-04-08 MED ORDER — BENZONATATE 200 MG PO CAPS: 200.0000 mg | ORAL_CAPSULE | Freq: Three times a day (TID) | ORAL | 0 refills | Status: DC | PRN

## 2023-04-08 MED ORDER — OXYCODONE HCL 5 MG PO TABS
5.0000 mg | ORAL_TABLET | ORAL | Status: DC | PRN
Start: 2023-04-08 — End: 2023-04-09
  Administered 2023-04-08 – 2023-04-09 (×2): 5 mg via ORAL
  Filled 2023-04-08 (×2): qty 1

## 2023-04-08 MED ORDER — SODIUM BICARBONATE 650 MG PO TABS
650.0000 mg | ORAL_TABLET | Freq: Two times a day (BID) | ORAL | Status: DC
  Administered 2023-04-08 – 2023-04-20 (×24): 650 mg via ORAL
  Filled 2023-04-08 (×24): qty 1

## 2023-04-08 MED ORDER — SODIUM BICARBONATE 650 MG PO TABS: 650.0000 mg | ORAL_TABLET | Freq: Two times a day (BID) | ORAL | 0 refills | Status: AC

## 2023-04-08 MED ORDER — CALAMINE EX LOTN: 1.0000 | TOPICAL_LOTION | CUTANEOUS | Status: DC | PRN

## 2023-04-08 MED ORDER — HYDROXYZINE HCL 25 MG PO TABS
25.0000 mg | ORAL_TABLET | Freq: Three times a day (TID) | ORAL | Status: DC | PRN
  Administered 2023-04-14: 25 mg via ORAL
  Filled 2023-04-08: qty 1

## 2023-04-08 MED ORDER — HYDROCORTISONE 1 % EX CREA: TOPICAL_CREAM | Freq: Three times a day (TID) | CUTANEOUS | Status: DC | PRN

## 2023-04-08 MED ORDER — HYDROXYZINE HCL 25 MG PO TABS: 25.0000 mg | ORAL_TABLET | Freq: Three times a day (TID) | ORAL | 0 refills | Status: DC | PRN

## 2023-04-08 MED ORDER — AMOXICILLIN-POT CLAVULANATE 500-125 MG PO TABS: 1.0000 | ORAL_TABLET | Freq: Two times a day (BID) | ORAL | 0 refills | Status: AC

## 2023-04-08 MED ORDER — ONDANSETRON HCL 4 MG/2ML IJ SOLN
4.0000 mg | Freq: Four times a day (QID) | INTRAMUSCULAR | Status: DC | PRN
Start: 2023-04-08 — End: 2023-04-20

## 2023-04-08 MED ORDER — FLEET ENEMA RE ENEM
1.0000 | ENEMA | Freq: Once | RECTAL | Status: DC | PRN
Start: 2023-04-08 — End: 2023-04-20

## 2023-04-08 MED ORDER — MIDODRINE HCL 5 MG PO TABS
10.0000 mg | ORAL_TABLET | Freq: Three times a day (TID) | ORAL | Status: DC
Start: 2023-04-09 — End: 2023-04-15
  Administered 2023-04-09 – 2023-04-15 (×19): 10 mg via ORAL
  Filled 2023-04-08 (×19): qty 2

## 2023-04-08 NOTE — TOC Transition Note (Signed)
Transition of Care [facility]) - CM/SW Discharge Note   Patient Details  Name: [name] [name] MRN: [id number] Date of Birth: October 11, 1945  [facility] of [facility]) CM/SW Contact:  Harriet Masson, RN Phone Number: 04/08/2023, 11:42 AM   Clinical Narrative:    Patient stable to transition to CIR.  TOC will sign off.   Final next level of care: IP Rehab Facility Barriers to Discharge: Barriers Resolved   Patient Goals and CMS Choice CMS Medicare.gov Compare Post Acute Care list provided to:: Patient Choice offered to / list presented to : Patient  Discharge Placement                         Discharge Plan and Services Additional resources added to the After Visit Summary for     Discharge Planning Services: CM Consult Post Acute Care Choice: Home Health          DME Arranged: N/A DME Agency: NA       HH Arranged: PT HH Agency: [facility] Date [facility] Agency Contacted: 03/26/23 Time HH Agency Contacted: 1026 Representative spoke with at [facility] Agency: Kandee Keen, patient active for HHPT need resumption of care orders  Social Determinants of Health (SDOH) Interventions SDOH Screenings   Food Insecurity: No Food Insecurity (03/26/2023)  Housing: Low Risk  (03/26/2023)  Transportation Needs: No Transportation Needs (03/26/2023)  Recent Concern: Transportation Needs - Unmet Transportation Needs (03/11/2023)   Received from [facility]  Utilities: Not At Risk (03/26/2023)  Physical Activity: Inactive (07/11/2017)  Stress: No Stress Concern Present (07/11/2017)  Tobacco Use: Medium Risk (03/26/2023)     Readmission Risk Interventions     No data to display

## 2023-04-08 NOTE — Progress Notes (Signed)
Patient ID: [name] [name], male   DOB: 12-17-1945, 77 y.o.   MRN: [id number] [facility] Progress Note   Assessment/ Plan:   1. Acute kidney Injury on chronic kidney disease stage IIIb (baseline creatinine around 2): Underlying chronic kidney disease suspected to be cardiorenal with acute injury likely from ischemic ATN in the setting of hypotension with ongoing ARB use (Entresto).  No evidence of obstruction associated with retroperitoneal hematoma. Now off milrinone, on midodrine, and oral torsemide. Crt stable at baseline. We will sign off but are available if needed. 2.  Fall/right retroperitoneal hematoma: Developed traumatic multilocular acute versus subacute retroperitoneal hematoma with intramuscular hemorrhage within the right iliacus muscle.  Apixaban transiently held and has been restarted 3.  Acute exacerbation of chronic systolic heart failure: Torsemide and Entresto originally held in the setting of acute kidney injury and with good diuresis/improving renal function following initiation of milrinone.  Off milrinone now. Mgmt per cardiology. 4.  Anemia: Mild and secondary to underlying chronic kidney disease/chronic illness with exacerbation due to retroperitoneal hemorrhage.  No indication for PRBC transfusion. 5.  Atrial fibrillation: Currently in sinus rhythm that is rate controlled while on amiodarone. 6.  Skin rash: Currently treating as an allergic skin reaction/pruritus with topical/systemic management.  Subjective:   Patient states he feels okay.  Wants to get to rehab to get stronger.  No other concerns.   Objective:   BP 119/66 (BP Location: Left Arm)   Pulse 93   Temp 97.6 F (36.4 C) (Oral)   Resp 16   Ht 6\' 2"  (1.88 m)   Wt 86.8 kg   SpO2 97%   BMI 24.57 kg/m   Intake/Output Summary (Last 24 hours) at 04/08/2023 0956 Last data filed at 04/08/2023 0300 Gross per 24 hour  Intake 635.34 ml  Output 700 ml  Net -64.66 ml   Weight change: -0.9  kg  Physical Exam: Gen: Lying in bed, no distress CVS: Normal rate, no rub Resp: No increased work of breathing, bilateral chest rise Abd: Soft, flat, nontender, bowel sounds normal Ext: trace edema  Imaging: No results found.  Labs: BMET Recent Labs  Lab 04/02/23 0214 04/03/23 0544 04/04/23 0600 04/05/23 0500 04/06/23 0500 04/07/23 0425 04/08/23 0301  NA 131* 131* 132* 130* 133* 134* 133*  K 4.1 3.4* 4.2 3.9 4.0 3.8 3.5  CL 102 100 100 98 100 98 96*  CO2 20* 21* 25 20* 27 26 26   GLUCOSE 196* 138* 250* 287* 127* 141* 164*  BUN 65* 65* 63* 60* 57* 53* 47*  CREATININE 2.58* 2.35* 2.23* 2.40* 2.02* 2.21* 2.10*  CALCIUM 8.1* 8.0* 7.9* 7.8* 7.9* 7.8* 7.7*   CBC Recent Labs  Lab 04/05/23 0500 04/06/23 0500 04/07/23 0425 04/08/23 0301  WBC 21.1* 28.8* 28.3* 37.8*  HGB 8.5* 10.0* 9.7* 10.4*  HCT 26.2* 30.3* 29.4* 32.0*  MCV 80.1 80.8 80.3 79.6*  PLT 711* 834* 738* 706*    Medications:     amiodarone  200 mg Oral Daily   amoxicillin-clavulanate  1 tablet Oral BID   apixaban  5 mg Oral BID   Chlorhexidine Gluconate Cloth  6 each Topical Daily   influenza vaccine adjuvanted  0.5 mL Intramuscular Tomorrow-1000   insulin aspart  0-15 Units Subcutaneous TID WC   insulin aspart  0-5 Units Subcutaneous QHS   midodrine  10 mg Oral TID WC   pneumococcal 20-valent conjugate vaccine  0.5 mL Intramuscular Tomorrow-1000   polyethylene glycol  17 g Oral Daily  senna-docusate  1 tablet Oral BID   sodium bicarbonate  650 mg Oral BID   sodium chloride flush  10-40 mL Intracatheter Q12H   sodium chloride flush  3 mL Intravenous Q12H   torsemide  20 mg Oral Daily  Darnell Level  04/08/2023, 9:56 AM

## 2023-04-08 NOTE — Progress Notes (Signed)
PMR Admission Coordinator Pre-Admission Assessment   Patient: [name] [name] is an 77 y.o., male MRN: [id number] DOB: [birth year] Height: 6\' 2"  (188 cm) Weight: 89.5 kg   Insurance Information HMO:     PPO:      PCP:      IPA:      80/20: yes     OTHER:  PRIMARY: Medicare A & B      Policy#: [id number]      Subscriber: patient CM Name:       Phone#:      Fax#:  Pre-Cert#:       Employer:  Benefits:  Phone #: verified eligibility via [employer] on 03/30/23     Name:  Eff. Date: Part A & B effective 07/30/10     Deduct: $1,632      Out of Pocket Max: NA      Life Max: NA CIR: 100% coverage      SNF: 100% coverage days 1-20, 80% coverage days 21-100 Outpatient: 80% coverage     Co-Pay: 20% Home Health: 100% coverage      Co-Pay:  DME: 80% coverage     Co-Pay: 20% Providers: pt's choice SECONDARY: Sharen Counter EMP      Policy#: [id number]     Phone#: [phone]   Financial Counselor:       Phone#:    The "Data Collection Information Summary" for patients in Inpatient Rehabilitation Facilities with attached "Privacy Act Statement-Health Care Records" was provided and verbally reviewed with: Patient   Emergency Contact Information Contact Information       Name Relation Home Work Mobile    Agostinelli,Donna Spouse [phone]   [phone]         Other Contacts   None on File        Current Medical History  Patient Admitting Diagnosis: retroperitoneal hematoma History of Present Illness: Pt is a 77 year old male with medical hx significant for: HTN, CAD, paroxysmal A-fib/flutter, chronic systolic CHF, s/p CCM/PM, PVD, DM II, bladder CA, CKD stage IIIb, right distal clavicle fx.  Pt presented to [facility] on 03/25/23 d/t right hip pain. Pt recently admitted to hospital 11/4-11/8 after a fall resulting in right distal clavicle fx. X-rays of hip negative for fracture or dislocation at that time. Pt reported hip pain increased and and was having pain with weightbearing since  returning home.  Hip x-ray negative for acute abnormalities. CT hip revealed retroperitoneal hematoma. CT abdomen/pelvis showed intramuscular hemorrhaging extending into iliacus muscle and iliopsoas on right. CT chest suggestive of PNA. Pt placed on 2L of O2 via nasal cannula. Cardiology consulted d/t acute on chronic systolic CHF. Started IV Lasix. Nephrology consulted d/t creatinine of 3.29.No HD indicated at this time. Pt with RLE swelling 04/08/23, venous duplex ordered.  Therapy evaluations completed and CIR recommended d/t pt's deficits in functional mobility.     Patient's medical record from [facility] has been reviewed by the rehabilitation admission coordinator and physician.   Past Medical History      Past Medical History:  Diagnosis Date   AICD (automatic cardioverter/defibrillator) present     Arthritis     Bladder cancer (HCC)     Borderline glaucoma     Chronic systolic (congestive) heart failure (HCC)     Coronary artery disease     Diabetes mellitus type 2, controlled (HCC) ORAL MED   Essential hypertension     Heart murmur     Hepatitis  Medical history non-contributory     Paroxysmal A-fib (HCC)     Paroxysmal A-fib (HCC) 08/20/2020   Peripheral vascular disease (HCC)     Pneumonia            Has the patient had major surgery during 100 days prior to admission? No   Family History   family history includes Heart attack in his sister; Heart disease in his sister.   Current Medications  Current Medications    Current Facility-Administered Medications:    albuterol (PROVENTIL) (2.5 MG/3ML) 0.083% nebulizer solution 2.5 mg, 2.5 mg, Nebulization, Q4H PRN, Foust, Katy L, NP   amiodarone (PACERONE) tablet 200 mg, 200 mg, Oral, Daily, Foust, Katy L, NP, 200 mg at 03/30/23 9811   benzonatate (TESSALON) capsule 200 mg, 200 mg, Oral, TID PRN, Foust, Katy L, NP   guaiFENesin (MUCINEX) 12 hr tablet 600 mg, 600 mg, Oral, BID, Foust, Katy L, NP, 600 mg at  03/30/23 0928   HYDROmorphone (DILAUDID) injection 0.5 mg, 0.5 mg, Intravenous, Q3H PRN, Foust, Katy L, NP, 0.5 mg at 03/28/23 1531   insulin aspart (novoLOG) injection 0-5 Units, 0-5 Units, Subcutaneous, QHS, Foust, Katy L, NP   insulin aspart (novoLOG) injection 0-6 Units, 0-6 Units, Subcutaneous, TID WC, Foust, Katy L, NP, 1 Units at 03/29/23 1731   melatonin tablet 5 mg, 5 mg, Oral, QHS PRN, Foust, Katy L, NP, 5 mg at 03/29/23 2112   midodrine (PROAMATINE) tablet 10 mg, 10 mg, Oral, TID WC, Adhikari, Amrit, MD, 10 mg at 03/30/23 1224   ondansetron (ZOFRAN) tablet 4 mg, 4 mg, Oral, Q6H PRN **OR** ondansetron (ZOFRAN) injection 4 mg, 4 mg, Intravenous, Q6H PRN, Foust, Katy L, NP   oxyCODONE (Oxy IR/ROXICODONE) immediate release tablet 5 mg, 5 mg, Oral, Q4H PRN, Foust, Katy L, NP, 5 mg at 03/30/23 1230   polyethylene glycol (MIRALAX / GLYCOLAX) packet 17 g, 17 g, Oral, Daily PRN, Foust, Katy L, NP, 17 g at 03/29/23 0828   polyethylene glycol (MIRALAX / GLYCOLAX) packet 17 g, 17 g, Oral, Daily, Adhikari, Amrit, MD, 17 g at 03/30/23 9147   senna-docusate (Senokot-S) tablet 1 tablet, 1 tablet, Oral, BID, Adhikari, Amrit, MD, 1 tablet at 03/30/23 8295   sodium bicarbonate tablet 650 mg, 650 mg, Oral, BID, Adhikari, Amrit, MD, 650 mg at 03/30/23 6213   sodium chloride flush (NS) 0.9 % injection 3 mL, 3 mL, Intravenous, Q12H, Foust, Katy L, NP, 3 mL at 03/30/23 0929   sodium chloride flush (NS) 0.9 % injection 3 mL, 3 mL, Intravenous, PRN, Foust, Katy L, NP     Patients Current Diet:  Diet Order                  Diet Heart Room service appropriate? Yes; Fluid consistency: Thin  Diet effective now                         Precautions / Restrictions Precautions Precautions: Fall Precaution Comments: watch BP and HR Restrictions Weight Bearing Restrictions:  (clarifying WB precautions with MD from prior hospital admission) RUE Weight Bearing: Non weight bearing Other Position/Activity  Restrictions: pt 11/8 orders NWB with gentle ROM recommends. Requests clarification from MD this session as pt reports using RW at home full WBat    Has the patient had 2 or more falls or a fall with injury in the past year? Yes   Prior Activity Level Limited Community (1-2x/wk): gets out of house ~1-2 days/week  Prior Functional Level Self Care: Did the patient need help bathing, dressing, using the toilet or eating? Independent   Indoor Mobility: Did the patient need assistance with walking from room to room (with or without device)? Independent   Stairs: Did the patient need assistance with internal or external stairs (with or without device)? Independent   Functional Cognition: Did the patient need help planning regular tasks such as shopping or remembering to take medications? Independent   Patient Information Are you of Hispanic, Latino/a,or Spanish origin?: A. No, not of Hispanic, Latino/a, or Spanish origin What is your race?: A. White Do you need or want an interpreter to communicate with a doctor or health care staff?: 0. No   Patient's Response To:  Health Literacy and Transportation Is the patient able to respond to health literacy and transportation needs?: Yes Health Literacy - How often do you need to have someone help you when you read instructions, pamphlets, or other written material from your doctor or pharmacy?: Never In the past 12 months, has lack of transportation kept you from medical appointments or from getting medications?: No In the past 12 months, has lack of transportation kept you from meetings, work, or from getting things needed for daily living?: No   Home Assistive Devices / Equipment Home Equipment: Agricultural consultant (2 wheels), [employer] - single point, Special educational needs teacher Use: Indicate devices/aids used by the patient prior to current illness, exacerbation or injury? Walker   Current Functional Level Cognition   Overall Cognitive Status: Within  Functional Limits for tasks assessed Orientation Level: Oriented X4    Extremity Assessment (includes Sensation/Coordination)   Upper Extremity Assessment: Right hand dominant, RUE deficits/detail RUE Deficits / Details: need further clarification on restrictions as pt using it without restrictions on arrival.  Lower Extremity Assessment: Generalized weakness, LLE deficits/detail LLE Deficits / Details: noted with standing needed cues for quad activation and knee extension     ADLs   Overall ADL's : Needs assistance/impaired Eating/Feeding: Modified independent Toilet Transfer: +2 for physical assistance, Moderate assistance, Stand-pivot, Regular Toilet Toilet Transfer Details (indicate cue type and reason): cues for hand placment and pt reaching back with L UE simulated RW to chair from eob     Mobility   Overal bed mobility: Needs Assistance Bed Mobility: Supine to Sit Supine to sit: Mod assist, HOB elevated, Used rails Sit to supine: Mod assist General bed mobility comments: ModA to complete moving LE's off EOB and for trunk elevation. Cues for sequencing     Transfers   Overall transfer level: Needs assistance Equipment used: Rolling walker (2 wheels) Transfers: Sit to/from Stand Sit to Stand: Mod assist, +2 physical assistance Bed to/from chair/wheelchair/BSC transfer type:: Step pivot Step pivot transfers: Min assist, +2 physical assistance General transfer comment: ModAx2 for boost up and steadying. Pt had difficulty standing upright with B knees tending to flex. Cues for upright posture and to straighten legs. Able to take steps towards recliner with RW managment and assist for steadying     Ambulation / Gait / Stairs / Wheelchair Mobility   Ambulation/Gait Ambulation/Gait assistance: Min assist, +2 physical assistance Gait Distance (Feet): 2 Feet Assistive device: Rolling walker (2 wheels) Gait Pattern/deviations: Step-to pattern, Decreased stride length,  Shuffle General Gait Details: able to take steps with B knees bent and forward flexed trunk, MinAx2 for RW management and steadying     Posture / Balance Dynamic Sitting Balance Sitting balance - Comments: R lateral lean initially with CGA,  progressed to supervision for safety with upright posture Balance Overall balance assessment: Needs assistance, History of Falls Sitting-balance support: Feet supported, Bilateral upper extremity supported Sitting balance-Leahy Scale: Fair Sitting balance - Comments: R lateral lean initially with CGA, progressed to supervision for safety with upright posture Postural control: Right lateral lean Standing balance support: Reliant on assistive device for balance, During functional activity, Bilateral upper extremity supported Standing balance-Leahy Scale: Poor Standing balance comment: Reliant on external support     Special needs/care consideration Oxygen 2L via nasal cannula, Skin Abrasion: knee, leg, foot/right; Ecchymosis: arm/right, left; Erythema/Redness: leg/right, and Diabetic management Novolog 0-5 units daily at bedtime; Novolog 0-6 units 3x daily with  meals    Previous Home Environment (from acute therapy documentation) Living Arrangements: Spouse/significant other  Lives With: Spouse Available Help at Discharge: Family, Available 24 hours/day Type of Home: House Home Layout: Two level Alternate Level Stairs-Rails: Left Alternate Level Stairs-Number of Steps: flight Home Access: Stairs to enter Entrance Stairs-Rails: None Entrance Stairs-Number of Steps: 2 Bathroom Shower/Tub: Psychologist, counselling, Engineer, manufacturing systems: Handicapped height Bathroom Accessibility: Yes How Accessible: Accessible via walker Home Care Services: Yes Type of Home Care Services: Home PT   Discharge Living Setting Plans for Discharge Living Setting: Patient's home Type of Home at Discharge: House Discharge Home Layout: Two level Alternate Level  Stairs-Rails: Left Alternate Level Stairs-Number of Steps: 14 Discharge Home Access: Stairs to enter Entrance Stairs-Rails: None Entrance Stairs-Number of Steps: 2 Discharge Bathroom Shower/Tub: Walk-in shower, Tub/shower unit Discharge Bathroom Toilet: Handicapped height Discharge Bathroom Accessibility: Yes How Accessible: Accessible via walker Does the patient have any problems obtaining your medications?: No   Social/Family/Support Systems Anticipated Caregiver: Reyce Drachenberg, wife Anticipated Caregiver's Contact Information: [phone] Caregiver Availability: 24/7 Discharge Plan Discussed with Primary Caregiver: Yes Is Caregiver In Agreement with Plan?: Yes Does Caregiver/Family have Issues with Lodging/Transportation while Pt is in Rehab?: No   Goals Patient/Family Goal for Rehab: PT/OT Supervision  Expected length of stay: 10-12 days  Pt/Family Agrees to Admission and willing to participate: Yes Program Orientation Provided & Reviewed with Pt/Caregiver Including Roles  & Responsibilities: Yes   Decrease burden of Care through IP rehab admission: NA   Possible need for SNF placement upon discharge: Not anticipated   Patient Condition: I have reviewed medical records from [facility], spoken with CM, and patient and spouse. I met with patient at the bedside for inpatient rehabilitation assessment.  Patient will benefit from ongoing PT and OT, can actively participate in 3 hours of therapy a day 5 days of the week, and can make measurable gains during the admission.  Patient will also benefit from the coordinated team approach during an Inpatient Acute Rehabilitation admission.  The patient will receive intensive therapy as well as Rehabilitation physician, nursing, social worker, and care management interventions.  Due to safety, skin/wound care, disease management, medication administration, pain management, and patient education the patient requires 24 hour a day  rehabilitation nursing.  The patient is currently min A  with mobility and basic ADLs.  Discharge setting and therapy post discharge at home with home health is anticipated.  Patient has agreed to participate in the Acute Inpatient Rehabilitation Program and will admit today.   Preadmission Screen Completed By:  Domingo Pulse, 03/30/2023 12:36 PM ______________________________________________________________________   Discussed status with Dr. Riley Kill  on 04/08/23 at 930 and received approval for admission today.   Admission Coordinator:  Domingo Pulse, CCC-SLP, time 1100/Date 04/08/23  Assessment/Plan: Diagnosis: debility related to retroperitoneal hematoma Does the need for close, 24 hr/day Medical supervision in concert with the patient's rehab needs make it unreasonable for this patient to be served in a less intensive setting? Yes Co-Morbidities requiring supervision/potential complications: HTN, PAF, csCHF, bladder CA, CKD IIIb Due to bladder management, bowel management, safety, skin/wound care, disease management, medication administration, pain management, and patient education, does the patient require 24 hr/day rehab nursing? Yes Does the patient require coordinated care of a physician, rehab nurse, PT, OT, and SLP to address physical and functional deficits in the context of the above medical diagnosis(es)? Yes Addressing deficits in the following areas: balance, endurance, locomotion, strength, transferring, bowel/bladder control, bathing, dressing, feeding, grooming, toileting, and psychosocial support Can the patient actively participate in an intensive therapy program of at least 3 hrs of therapy 5 days a week? Yes The potential for patient to make measurable gains while on inpatient rehab is excellent Anticipated functional outcomes upon discharge from inpatient rehab: supervision PT, supervision OT, n/a SLP Estimated rehab length of stay to reach the above  functional goals is: 10-12 days Anticipated discharge destination: Home 10. Overall Rehab/Functional Prognosis: excellent     MD Signature: Ranelle Oyster, MD, [facility] Physical Medicine & Rehabilitation Medical Director Rehabilitation Services 04/08/2023

## 2023-04-08 NOTE — Progress Notes (Signed)
Printed AVS paperwork, removed Right internal jugular, gathered patient belongings, and patient was wheeled up to 4W-20

## 2023-04-08 NOTE — Discharge Summary (Signed)
Physician Discharge Summary  [name] [name] WGN:[id number] DOB: [birth year] DOA: 03/25/2023  PCP: [name], MD  Admit date: 03/25/2023  Discharge date: 04/08/2023  Admitted From: Home. Disposition:  Inpatient rehab.  Recommendations for Outpatient Follow-up:  Follow up with PCP in 1-2 weeks. Please obtain BMP/CBC in one week. Advised to follow-up with Right lower extremity venous duplex for rule out DVT.  Home Health: None Equipment/Devices:Home oxygen @ 2l /min  Discharge Condition: Stable CODE STATUS:Full code Diet recommendation: Heart Healthy  Brief [facility] Course: This 77 year old male with history of hypertension, coronary artery disease, paroxysmal A-fib/flutter, chronic systolic CHF, status post pacemaker placement, peripheral vascular disease, diabetes type 2, bladder cancer, CKD stage IIIb who presented from home with complaint of hip pain after the fall.  He was also admitted on 03/05/2023 and was discharged on 11/24 after he presented with fall at home, found to have right distal clavicle fracture that was managed nonoperatively.  Patient reported worsening progressive right hip pain for last 2 days, unable to ambulate.  On presentation, he was hemodynamically stable.  Lab work showed WBC count of 14.2, creatinine of 1.9, hemoglobin of 10.9.  CT hip showed evidence of right retroperitoneal hematoma extending into the distal right psoas and right iliacus muscle.  CT chest also showed multifocal groundglass opacities bilaterally.  Started on antibiotics for possible PNA.  Cardiology consulted for volume overload and nephrology consulted for worsening renal function..  Due to severe systolic CHF and volume overload, started on milrinone on 12/2.  Now clinically improving.  Milrinone infusion discontinued.  Patient back to baseline euvolemic state.  Renal functions improved.  Nephrology signed off.  Patient is being discharged to acute inpatient rehab.   Discharge  Diagnoses:  Principal Problem:   Retroperitoneal hematoma Active Problems:   Acute on chronic combined systolic and diastolic heart failure (HCC)   Stage 3b chronic kidney disease (HCC)   Low output heart failure (HCC)   AKI (acute kidney injury) (HCC)  Acute on Chronic systolic congestive heart failure:    Status post AICD. Home torsemide, Entresto on hold. Last echo done on 11/7 showed EF of 20 to 25%.  Elevated BNP. Hospital course remarkable for persistent volume overload.  Started on IV Lasix and milrinone after PICC line placement.  Volume status improving.   Cardiology stopped milrinone infusion.  Patient resumed on torsemide 20 mg daily.  Cardiology signed off.   AKI on CKD stage IIIb:  Baseline creatinine around 2.5.  Patient did had some increase in creatinine which has been improved and stable now.  He follows with [facility], nephrology also consulted, given need of persistent diuresis with IV medications and worsening kidney function. Currently on IV Lasix.  Creatinine trending down and stable.  Resumed on home torsemide dosing.   Fall/retroperitoneal hematoma: CT right hip showed Retroperitoneal hematoma extending into the distal right psoas and right iliacus muscle. CT imaging also showed multilocular acute to subacute retroperitoneal hematoma with a component noted superiorly along the posterolateral abdominal wall measuring 4.4 x 2.6 cm. Intramuscular hemorrhage within the right iliacus muscle with the dominant collection measuring 4.5 x 8.1 cm.  Continue pain management, supportive care.  This is most likely secondary to fall.  PT/OT consulted, CIR recommended.  Patient lives with his wife, ambulates with help of walker but has not been able to use it recently. Case was discussed with general surgery by ED physician, no surgical intervention recommended. Continue to monitor.   Community acquired pneumonia:  CT imaging showed multilevel groundglass infiltrates  bilaterally.currently stable.  Continue oral antibiotics to finish the course.   Right lower extremity swelling: Venous Doppler did not show any DVT. Venous duplex ordered    Elevated liver enzymes: Mild, continue to monitor.  Unclear etiology.  Hepatitis panel negative.   Likely from chronic hepatic congestion from severe CHF   Leukocytosis: Unclear etiology.  Chronic.  On his last admission also, he had leukocytosis in the range of 20K . No fever  Denies any abdominal pain.  Unremarkable UA and chest x-ray.  Blood culture sent: NGTD.  Procalcitonin reassuring. Abdomen CT abdomen/pelvis did not show any acute findings except for right iliopsoas musculature which could be bursitis or hematoma.  Started on empiric Zosyn now changed to Augmentin.  Leukocytosis probably worsened because of steroids.  Anticipating gradual improvement.  Currently seems stable.   Atrial flutter/fibrillation: Monitor on telemetry.  Eliquis resumed.     Follows with cardiology as an outpatient.  Currently in normal sinus rhythm. HR well controlled   Hypertension: Currently blood pressure soft.  Hold  Entresto.  Continue midodrine.    Coronary artery disease: Lipitor on hold due to elevated enzymes.  No anginal symptoms.   Type 2 diabetes: Monitor blood sugars, continue sliding scale. A1c of 6.9.Started on insulin because he is on steroids.   Rash: Developed itch maculopapular rash on his chest, back and buttocks.  Unclear etiology.  Might have some drug reaction but not sure which one.  Ordered calamine lotion, Solu-Medrol, hydroxyzine as needed.Rash continues to improve,now almost resolved.  Tapered steroid to oral   Lung nodule: CT imaging showed 16 mm nodule within the basilar right middle lobe, indeterminate. Repeat CT in  3 months or PET -CT   Discharge Instructions  Discharge Instructions     Call MD for:  difficulty breathing, headache or visual disturbances   Complete by: As directed    Call MD for:   persistant dizziness or light-headedness   Complete by: As directed    Call MD for:  persistant nausea and vomiting   Complete by: As directed    Diet - low sodium heart healthy   Complete by: As directed    Diet Carb Modified   Complete by: As directed    Increase activity slowly   Complete by: As directed    No wound care   Complete by: As directed       Allergies as of 04/08/2023       Reactions   Spironolactone Other (See Comments)   Painful gynecomastia   Semaglutide    Nausea and bloating   Tramadol Other (See Comments)   Sleep disturbance.  Apnea spells.   Empagliflozin Rash   Rash at the groin Jardiance   Niacin And Related Hives        Medication List     STOP taking these medications    Dristan 0.05 % nasal spray Generic drug: oxymetazoline   Entresto 49-51 MG Generic drug: sacubitril-valsartan       TAKE these medications    acetaminophen 500 MG tablet Commonly known as: TYLENOL Take 1,000 mg by mouth every 6 (six) hours as needed for mild pain (pain score 1-3), headache or fever.   amiodarone 200 MG tablet Commonly known as: PACERONE Take 1 tablet (200 mg total) by mouth daily. What changed: when to take this   amoxicillin-clavulanate 500-125 MG tablet Commonly known as: AUGMENTIN Take 1 tablet by mouth 2 (two) times daily for 2  days.   atorvastatin 80 MG tablet Commonly known as: LIPITOR TAKE 1 TABLET BY MOUTH EVERY DAY   benzonatate 200 MG capsule Commonly known as: TESSALON Take 1 capsule (200 mg total) by mouth 3 (three) times daily as needed for cough.   cyanocobalamin 250 MCG tablet Commonly known as: VITAMIN B12 Take 500 mcg by mouth daily.   Eliquis 5 MG Tabs tablet Generic drug: apixaban TAKE 1 TABLET TWICE A DAY What changed: how much to take   hydrOXYzine 25 MG tablet Commonly known as: ATARAX Take 1 tablet (25 mg total) by mouth 3 (three) times daily as needed for itching.   midodrine 10 MG tablet Commonly known  as: PROAMATINE Take 1 tablet (10 mg total) by mouth 3 (three) times daily with meals.   oxyCODONE 5 MG immediate release tablet Commonly known as: Oxy IR/ROXICODONE Take 1 tablet (5 mg total) by mouth every 4 (four) hours as needed for moderate pain (pain score 4-6).   sodium bicarbonate 650 MG tablet Take 1 tablet (650 mg total) by mouth 2 (two) times daily for 10 days.   torsemide 20 MG tablet Commonly known as: DEMADEX Take 1 tablet (20 mg total) by mouth daily.   zolpidem 10 MG tablet Commonly known as: AMBIEN Take 5 mg by mouth at bedtime.        Follow-up Information     [facility] .   Why: Dec. 30 , 2024 @2 :30 PM Contact information: [address] [place] [address] [phone]        [facility] Follow up.   Specialty: Home Health Services Contact information: [address] [place] [address] [phone]         [name], MD Follow up in 1 week(s).   Specialty: Family Medicine Why: Dec. 17 ,2024 @3 :30 PM Contact information: [address] [place] [address] [phone]                Allergies  Allergen Reactions   Spironolactone Other (See Comments)    Painful gynecomastia   Semaglutide     Nausea and bloating   Tramadol Other (See Comments)    Sleep disturbance.  Apnea spells.   Empagliflozin Rash    Rash at the groin Jardiance   Niacin And Related Hives    Consultations: Nephrology Cardiology   Procedures/Studies: CT ABDOMEN PELVIS WO CONTRAST  Result Date: 04/03/2023 CLINICAL DATA:  Sepsis.  Hip pain. EXAM: CT ABDOMEN AND PELVIS WITHOUT CONTRAST TECHNIQUE: Multidetector CT imaging of the abdomen and pelvis was performed following the standard protocol without IV contrast. RADIATION DOSE REDUCTION: This exam was performed according to the departmental dose-optimization program which includes automated exposure control,  adjustment of the mA and/or kV according to patient size and/or use of iterative reconstruction technique. COMPARISON:  Abdominopelvic CT 03/26/2023.  Pelvic CT 04/16/2019. FINDINGS: Lower chest: Cardiac pacemaker leads extend into the right atrium and right ventricle. There is aortic and coronary artery atherosclerosis. Unchanged moderate right and small left pleural effusions with associated compressive atelectasis at both lung bases. The recently demonstrated right middle lobe nodule is incompletely visualized, although grossly stable. No new nodularity identified at the visualized lung bases. There is underlying mild to moderate centrilobular emphysema. Hepatobiliary: The liver appears stable, without focal abnormality on noncontrast imaging. No evidence of significant biliary dilatation status post cholecystectomy. Pancreas: Unremarkable. No pancreatic ductal dilatation or surrounding inflammatory changes. Spleen: Normal in size without  focal abnormality. Adrenals/Urinary Tract: Both adrenal glands appear normal. No evidence of urinary tract calculus, suspicious renal lesion or hydronephrosis. Stable renal atrophy and cortical scarring on the right. Stable renal vascular calcifications bilaterally. The bladder appears unremarkable for its degree of distention. Stomach/Bowel: Small amount of enteric contrast in the stomach. The stomach appears unremarkable for its degree of distension. No evidence of bowel wall thickening, distention or surrounding inflammatory change. The appendix is tentatively identified and appears unremarkable. There is prominent stool throughout the colon. Mild diverticular changes throughout the descending and sigmoid colon. Vascular/Lymphatic: There are no enlarged abdominal or pelvic lymph nodes. Extensive aortic and branch vessel atherosclerosis again noted without evidence of aneurysm. Reproductive: The prostate gland and seminal vesicles appear unremarkable. Other: No significant  change in recently demonstrated multifocal fluid collections along the course of the right iliopsoas musculature. A superior component along the right posterolateral abdominal wall measures 4.3 x 3.0 cm on image 41/3. Ill-defined component associated with the right iliacus muscle measures approximately 7.8 x 4.3 cm on image 56/3. Distal component anterior to the right femoral head measures approximately 4.4 x 2.4 cm on image 82/3 and may be slightly larger. These collections do not demonstrate any high-density components or air. No ascites or pneumoperitoneum. No abdominal wall hernia. Musculoskeletal: No evidence of acute fracture, dislocation or osteomyelitis. Degenerative changes of the sacroiliac joints and mild multilevel lumbar spondylosis. Subcutaneous edema in both flanks has mildly increased. There is asymmetric extension of subcutaneous edema into the proximal right thigh. IMPRESSION: 1. No significant change in recently demonstrated multifocal fluid collections along the course of the right iliopsoas musculature. The distal component anterior to the right femoral head may be slightly larger. These collections do not demonstrate any high-density components or air and could be secondary to subacute or chronic hematoma versus bursitis. In the setting of sepsis, aspiration should be considered to exclude infection. 2. No evidence of osteomyelitis or discitis. 3. Unchanged moderate right and small left pleural effusions with associated compressive atelectasis at both lung bases. 4. Mildly increased subcutaneous edema in both flanks with asymmetric extension into the proximal right thigh. 5. Incompletely visualized, but grossly stable right middle lobe nodule. See follow up recommendations from previous chest CT of 03/26/2023. 6. Aortic Atherosclerosis (ICD10-I70.0) and Emphysema (ICD10-J43.9). Electronically Signed   By: Carey Bullocks M.D.   On: 04/03/2023 16:02   DG CHEST PORT 1 VIEW  Result Date:  04/02/2023 CLINICAL DATA:  77 year old male with shortness of breath and leukocytosis. EXAM: PORTABLE CHEST 1 VIEW COMPARISON:  Portable chest 03/28/2023 and earlier. FINDINGS: Portable AP semi upright view at 0759 hours. Stable cardiomegaly and mediastinal contours. Stable cardiac pacemakers/ICD. Stable lung volumes. No pneumothorax, pulmonary edema, pleural effusion or confluent lung opacity. Pulmonary vascularity appears stable since 03/04/2023. Calcified aortic atherosclerosis. Visualized tracheal air column is within normal limits. Negative visible bowel gas. IMPRESSION: Stable.  No acute cardiopulmonary abnormality. Electronically Signed   By: Odessa Fleming M.D.   On: 04/02/2023 11:26   IR Fluoro Guide CV Line Right  Result Date: 04/01/2023 INDICATION: Heart failure patient, Milrinone therapy access EXAM: TUNNELED PICC LINE WITH ULTRASOUND AND FLUOROSCOPIC GUIDANCE MEDICATIONS: 1% lidocaine local. ANESTHESIA/SEDATION: None. FLUOROSCOPY: Radiation Exposure Index (as provided by the fluoroscopic device): 9.0 mGy Kerma COMPLICATIONS: None immediate. PROCEDURE: Informed written consent was obtained from the patient after a discussion of the risks, benefits, and alternatives to treatment. Questions regarding the procedure were encouraged and answered. The right neck and chest were prepped with chlorhexidine  in a sterile fashion, and a sterile drape was applied covering the operative field. Maximum barrier sterile technique with sterile gowns and gloves were used for the procedure. A timeout was performed prior to the initiation of the procedure. After creating a small venotomy incision, a micropuncture kit was utilized to access the right internal jugular vein under direct, real-time ultrasound guidance after the overlying soft tissues were anesthetized with 1% lidocaine with epinephrine. Ultrasound image documentation was performed. The microwire was kinked to measure appropriate catheter length. The micropuncture  sheath was exchanged for a peel-away sheath over a guidewire. A 5 French dual lumen tunneled PICC measuring 28 cm was tunneled in a retrograde fashion from the anterior chest wall to the venotomy incision. The catheter was then placed through the peel-away sheath with tip ultimately positioned at the superior caval-atrial junction. Final catheter positioning was confirmed and documented with a spot radiographic image. The catheter aspirates and flushes normally. The catheter was flushed with appropriate volume heparin dwells. The catheter exit site was secured with a 0-Prolene retention suture. The venotomy incision was closed with an interrupted 4-0 Vicryl, Dermabond and Steri-strips. Dressings were applied. The patient tolerated the procedure well without immediate post procedural complication. FINDINGS: After catheter placement, the tip lies within the superior cavoatrial junction. The catheter aspirates and flushes normally and is ready for immediate use. IMPRESSION: Successful placement of 28cm dual lumen tunneled PICC catheter via the right internal jugular vein with tip terminating at the superior caval atrial junction. The catheter is ready for immediate use. Electronically Signed   By: Judie Petit.  Shick M.D.   On: 04/01/2023 10:07   IR US Guide Vasc Access Right  Result Date: 04/01/2023 INDICATION: Heart failure patient, Milrinone therapy access EXAM: TUNNELED PICC LINE WITH ULTRASOUND AND FLUOROSCOPIC GUIDANCE MEDICATIONS: 1% lidocaine local. ANESTHESIA/SEDATION: None. FLUOROSCOPY: Radiation Exposure Index (as provided by the fluoroscopic device): 9.0 mGy Kerma COMPLICATIONS: None immediate. PROCEDURE: Informed written consent was obtained from the patient after a discussion of the risks, benefits, and alternatives to treatment. Questions regarding the procedure were encouraged and answered. The right neck and chest were prepped with chlorhexidine in a sterile fashion, and a sterile drape was applied covering  the operative field. Maximum barrier sterile technique with sterile gowns and gloves were used for the procedure. A timeout was performed prior to the initiation of the procedure. After creating a small venotomy incision, a micropuncture kit was utilized to access the right internal jugular vein under direct, real-time ultrasound guidance after the overlying soft tissues were anesthetized with 1% lidocaine with epinephrine. Ultrasound image documentation was performed. The microwire was kinked to measure appropriate catheter length. The micropuncture sheath was exchanged for a peel-away sheath over a guidewire. A 5 French dual lumen tunneled PICC measuring 28 cm was tunneled in a retrograde fashion from the anterior chest wall to the venotomy incision. The catheter was then placed through the peel-away sheath with tip ultimately positioned at the superior caval-atrial junction. Final catheter positioning was confirmed and documented with a spot radiographic image. The catheter aspirates and flushes normally. The catheter was flushed with appropriate volume heparin dwells. The catheter exit site was secured with a 0-Prolene retention suture. The venotomy incision was closed with an interrupted 4-0 Vicryl, Dermabond and Steri-strips. Dressings were applied. The patient tolerated the procedure well without immediate post procedural complication. FINDINGS: After catheter placement, the tip lies within the superior cavoatrial junction. The catheter aspirates and flushes normally and is ready for immediate use. IMPRESSION:  Successful placement of 28cm dual lumen tunneled PICC catheter via the right internal jugular vein with tip terminating at the superior caval atrial junction. The catheter is ready for immediate use. Electronically Signed   By: Judie Petit.  Shick M.D.   On: 04/01/2023 10:07   DG CHEST PORT 1 VIEW  Result Date: 03/28/2023 CLINICAL DATA:  Short of breath. EXAM: PORTABLE CHEST 1 VIEW COMPARISON:  03/04/2023,  01/16/2023 and CT dated 03/26/2023. FINDINGS: Stable changes from prior cardiac surgery and stable cardiac enlargement. No mediastinal or hilar masses. Left and right anterior chest wall pacemakers/AICD are stable. Irregular interstitial opacities and mild hazy intervening airspace opacities are noted in the upper lobes, greater on the right. Minor basilar opacities are noted suspected to be atelectasis. Lungs are hyperexpanded but otherwise clear. No visualized pleural effusion.  No pneumothorax. IMPRESSION: 1. Interstitial hazy airspace lung opacities in the upper lobes most evident on the right. On the recent prior chest CT, this was more apparent on the left. Consider asymmetric edema versus pneumonia. 2. No visualized pleural effusions. These may be improved from the recent prior chest CT or not visualized due to the AP technique. 3. No new abnormalities.  Stable cardiomegaly. Electronically Signed   By: Amie Portland M.D.   On: 03/28/2023 11:14   VAS [place] LOWER EXTREMITY VENOUS (DVT)  Result Date: 03/27/2023  Lower Venous DVT Study Patient Name:  [name] [name]  Date of Exam:   03/26/2023 Medical Rec #: [id number]         Accession #:    [id number] Date of Birth: 1946/01/21         Patient Gender: M Patient Age:   58 years Exam Location:  [facility] Procedure:      VAS [place] LOWER EXTREMITY VENOUS (DVT) Referring Phys: Orpha Bur FOUST --------------------------------------------------------------------------------  Indications: Swelling, Edema, and A-fib.  Comparison Study: No prior exam. Performing Technologist: Fernande Bras  Examination Guidelines: A complete evaluation includes B-mode imaging, spectral Doppler, color Doppler, and power Doppler as needed of all accessible portions of each vessel. Bilateral testing is considered an integral part of a complete examination. Limited examinations for reoccurring indications may be performed as noted. The reflux portion of the exam is performed with the  patient in reverse Trendelenburg.  +---------+---------------+---------+-----------+----------+--------------+ RIGHT    CompressibilityPhasicitySpontaneityPropertiesThrombus Aging +---------+---------------+---------+-----------+----------+--------------+ CFV      Full           Yes      Yes                                 +---------+---------------+---------+-----------+----------+--------------+ SFJ      Full                                                        +---------+---------------+---------+-----------+----------+--------------+ FV Prox  Full                                                        +---------+---------------+---------+-----------+----------+--------------+ FV Mid   Full                                                        +---------+---------------+---------+-----------+----------+--------------+  FV DistalFull                                                        +---------+---------------+---------+-----------+----------+--------------+ PFV      Full                                                        +---------+---------------+---------+-----------+----------+--------------+ POP      Full           Yes      Yes                                 +---------+---------------+---------+-----------+----------+--------------+ PTV      Full                                                        +---------+---------------+---------+-----------+----------+--------------+ PERO     Full                                                        +---------+---------------+---------+-----------+----------+--------------+   +----+---------------+---------+-----------+----------+--------------+ LEFTCompressibilityPhasicitySpontaneityPropertiesThrombus Aging +----+---------------+---------+-----------+----------+--------------+ CFV Full           Yes      Yes                                  +----+---------------+---------+-----------+----------+--------------+     Summary: RIGHT: - There is no evidence of deep vein thrombosis in the lower extremity.  - No cystic structure found in the popliteal fossa.  LEFT: - No evidence of common femoral vein obstruction.   *See table(s) above for measurements and observations. Electronically signed by Heath Lark on 03/27/2023 at 4:44:43 PM.    Final    CT CHEST ABDOMEN PELVIS W CONTRAST  Result Date: 03/26/2023 CLINICAL DATA:  Retroperitoneal hematoma, fall, chronic anticoagulation EXAM: CT CHEST, ABDOMEN, AND PELVIS WITH CONTRAST TECHNIQUE: Multidetector CT imaging of the chest, abdomen and pelvis was performed following the standard protocol during bolus administration of intravenous contrast. RADIATION DOSE REDUCTION: This exam was performed according to the departmental dose-optimization program which includes automated exposure control, adjustment of the mA and/or kV according to patient size and/or use of iterative reconstruction technique. CONTRAST:  80mL OMNIPAQUE IOHEXOL 350 MG/ML SOLN COMPARISON:  CT pelvis 04/16/2019 FINDINGS: CT CHEST FINDINGS Cardiovascular: Global cardiac size within normal limits. 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 Left subclavian pacemaker leads are seen within the right atrium and right ventricle. Coronary artery bypass grafting has been performed. No pericardial effusion. Central pulmonary arteries are enlarged in keeping with changes of pulmonary arterial hypertension. Extensive atherosclerotic calcification within the thoracic aorta. No aortic  aneurysm. Mediastinum/Nodes: 15 mm nodule within the right thyroid gland is indeterminate, not well characterized on this examination. Numerous pathologically enlarged mediastinal lymph nodes are identified measuring up to 19 mm in short axis diameter within the subcarinal  lymph node groups. These may be reactive, inflammatory, or lymphoproliferative in nature. Lungs/Pleura: Moderate emphysema. Small bilateral pleural effusions are present with associated bibasilar compressive atelectasis. Fluid within the right major fissure noted. There is superimposed multifocal ground-glass pulmonary infiltrate within the upper lobes bilaterally, new within the lung apices to prior CT examination 03/04/2023, more focal within the left upper lobe in keeping with acute infection in the appropriate clinical setting. More solid-appearing 16 mm nodule within the basilar right middle lobe axial image # 120/5 is indeterminate. No pneumothorax. No central obstructing lesion. Musculoskeletal: No acute bone abnormality. No lytic or blastic bone lesion. CT ABDOMEN PELVIS FINDINGS Hepatobiliary: No focal liver abnormality is seen. Status post cholecystectomy. No biliary dilatation. Pancreas: Unremarkable Spleen: Unremarkable Adrenals/Urinary Tract: The adrenal glands are unremarkable. The kidneys are normal in position. There is marked cortical atrophy of the right kidney which demonstrates delayed and decreased relative perfusion. The left kidney is normal in size. No hydronephrosis. No definite intrarenal or ureteral calculi. The bladder is unremarkable. Stomach/Bowel: Moderate descending and sigmoid colonic diverticulosis. Stomach, small bowel, and large bowel are otherwise unremarkable. Appendix normal. No free intraperitoneal gas or fluid. Vascular/Lymphatic: Extensive aortoiliac atherosclerotic calcification. No aortic aneurysm. No pathologic adenopathy within the abdomen and pelvis. Reproductive: Prostate is unremarkable. Other: Multilocular retroperitoneal acute to subacute hematoma is seen with a component noted superiorly along the posterolateral abdominal wall measuring 4.4 x 2.6 cm at axial image # 87/3. There is intramuscular hemorrhage within the right iliacus muscle with the dominant collection  measuring 4.5 x 8.1 cm at axial image # 104/3. Intramuscular hemorrhage is seen within the distal iliopsoas muscle extending to level of the lesser trochanter. No active extravasation identified. Extensive circumferential subcutaneous edema is seen in the proximal right. Musculoskeletal: Osseous structures are age-appropriate. No acute bone abnormality. No lytic or blastic bone lesion. IMPRESSION: 1. Multilocular acute to subacute retroperitoneal hematoma with a component noted superiorly along the posterolateral abdominal wall measuring 4.4 x 2.6 cm. Intramuscular hemorrhage within the right iliacus muscle with the dominant collection measuring 4.5 x 8.1 cm. Intramuscular hemorrhage also noted within the distal iliopsoas muscle extending to level of the lesser trochanter. No active extravasation identified. 2. Small bilateral pleural effusions with associated bibasilar compressive atelectasis. 3. Multifocal ground-glass pulmonary infiltrate within the upper lobes bilaterally 7 port in keeping with acute infection in the appropriate clinical setting. 4. 16 mm nodule within the basilar right middle lobe, indeterminate. Consider one of the following in 3 months for both low-risk and high-risk individuals: (a) repeat chest CT, (b) follow-up PET-CT, or (c) tissue sampling. This recommendation follows the consensus statement: Guidelines for Management of Incidental Pulmonary Nodules Detected on CT Images: From the Fleischner Society 2017; Radiology 2017; 284:228-243. 5. Marked cortical atrophy of the right kidney 6. Moderate distal colonic diverticulosis. 7. 15 mm nodule within the right thyroid gland, not well characterized on this examination. In the setting of significant comorbidities or limited life expectancy, no follow-up recommended (ref: J Am Coll Radiol. 2015 Feb;12(2): 143-50). Aortic Atherosclerosis (ICD10-I70.0) and Emphysema (ICD10-J43.9). Electronically Signed   By: Helyn Numbers M.D.   On: 03/26/2023  04:08   CT HIP RIGHT WO CONTRAST  Result Date: 03/26/2023 CLINICAL DATA:  Larey Seat 2 weeks ago. Progressive worsening right hip  pain. EXAM: CT OF THE RIGHT HIP WITHOUT CONTRAST TECHNIQUE: Multidetector CT imaging of the right hip was performed according to the standard protocol. Multiplanar CT image reconstructions were also generated. RADIATION DOSE REDUCTION: This exam was performed according to the departmental dose-optimization program which includes automated exposure control, adjustment of the mA and/or kV according to patient size and/or use of iterative reconstruction technique. COMPARISON:  Radiograph 03/25/2023 FINDINGS: Bones/Joint/Cartilage No acute fracture or dislocation.  Degenerative arthritis right hip. Ligaments Suboptimally assessed by CT. Muscles and Tendons Retroperitoneal low to intermediate density fluid collection extending into the distal right psoas and right iliacus muscle compatible with retroperitoneal hematoma. This measures 7.6 x 5.8 cm. Soft tissues Nonspecific edema in the proximal right thigh. Vascular calcifications. IMPRESSION: 1. No acute fracture or dislocation. 2. Retroperitoneal hematoma extending into the distal right psoas and right iliacus muscle. Electronically Signed   By: Minerva Fester M.D.   On: 03/26/2023 01:33   DG Hip Unilat W or Wo Pelvis 2-3 Views Right  Result Date: 03/25/2023 CLINICAL DATA:  Hip pain EXAM: DG HIP (WITH OR WITHOUT PELVIS) 2-3V RIGHT COMPARISON:  Right hip x-ray 03/05/2023 FINDINGS: The bones are mildly osteopenic. There is no acute fracture or dislocation. There are moderate degenerative changes of both hips similar to the prior study. Peripheral vascular calcifications are present. IMPRESSION: 1. No acute fracture or dislocation. 2. Moderate degenerative changes of both hips. Electronically Signed   By: Darliss Cheney M.D.   On: 03/25/2023 22:05     Subjective: Patient was seen and examined at bedside.  Overnight events noted.    Patient reports feeling much improved.  He wants to be discharged to acute inpatient rehab.  Discharge Exam: Vitals:   04/08/23 0824 04/08/23 1207  BP: 119/66 122/69  Pulse: 93 92  Resp: 16 20  Temp: 97.6 F (36.4 C) 97.9 F (36.6 C)  SpO2: 97% 96%   Vitals:   04/08/23 0226 04/08/23 0545 04/08/23 0824 04/08/23 1207  BP: 136/73  119/66 122/69  Pulse: 93  93 92  Resp: 16  16 20   Temp: 97.9 F (36.6 C)  97.6 F (36.4 C) 97.9 F (36.6 C)  TempSrc: Oral  Oral Oral  SpO2: 94%  97% 96%  Weight:  86.8 kg    Height:        General: Pt is alert, awake, not in acute distress Cardiovascular: RRR, S1/S2 +, no rubs, no gallops Respiratory: CTA bilaterally, no wheezing, no rhonchi Abdominal: Soft, NT, ND, bowel sounds + Extremities: RLE pitting edema, NO cyanosis, No clubbing    The results of significant diagnostics from this hospitalization (including imaging, microbiology, ancillary and laboratory) are listed below for reference.     Microbiology: Recent Results (from the past 240 hour(s))  Culture, blood (Routine X 2) w Reflex to ID Panel     Status: None   Collection Time: 04/02/23  9:26 AM   Specimen: BLOOD  Result Value Ref Range Status   Specimen Description BLOOD BLOOD RIGHT HAND  Final   Special Requests   Final    BOTTLES DRAWN AEROBIC AND ANAEROBIC Blood Culture results may not be optimal due to an inadequate volume of blood received in culture bottles   Culture   Final    NO GROWTH 5 DAYS Performed at [facility], [address], [place], [place] [address]    Report Status 04/07/2023 FINAL  Final  Culture, blood (Routine X 2) w Reflex to ID Panel  Status: None   Collection Time: 04/02/23  9:26 AM   Specimen: BLOOD  Result Value Ref Range Status   Specimen Description BLOOD BLOOD RIGHT HAND  Final   Special Requests   Final    BOTTLES DRAWN AEROBIC AND ANAEROBIC Blood Culture results may not be optimal due to an inadequate volume of blood received  in culture bottles   Culture   Final    NO GROWTH 5 DAYS Performed at [facility], [address], [place], [place] [address]    Report Status 04/07/2023 FINAL  Final     Labs: BNP (last 3 results) Recent Labs    03/05/23 0449 03/28/23 0548 03/31/23 0823  BNP 742.9* 2,038.1* 3,018.3*   Basic Metabolic Panel: Recent Labs  Lab 04/02/23 0214 04/03/23 0544 04/04/23 0600 04/05/23 0500 04/06/23 0500 04/07/23 0425 04/08/23 0301  NA 131* 131* 132* 130* 133* 134* 133*  K 4.1 3.4* 4.2 3.9 4.0 3.8 3.5  CL 102 100 100 98 100 98 96*  CO2 20* 21* 25 20* 27 26 26   GLUCOSE 196* 138* 250* 287* 127* 141* 164*  BUN 65* 65* 63* 60* 57* 53* 47*  CREATININE 2.58* 2.35* 2.23* 2.40* 2.02* 2.21* 2.10*  CALCIUM 8.1* 8.0* 7.9* 7.8* 7.9* 7.8* 7.7*  MG 2.1 2.1 2.0 2.0 2.0  --   --    Liver Function Tests: Recent Labs  Lab 04/03/23 0544 04/05/23 0500  AST 118* 130*  ALT 101* 123*  ALKPHOS 101 103  BILITOT 0.9 0.6  PROT 4.7* 4.8*  ALBUMIN 1.7* 1.8*   No results for input(s): "LIPASE", "AMYLASE" in the last 168 hours. No results for input(s): "AMMONIA" in the last 168 hours. CBC: Recent Labs  Lab 04/04/23 0600 04/05/23 0500 04/06/23 0500 04/07/23 0425 04/08/23 0301  WBC 18.8* 21.1* 28.8* 28.3* 37.8*  HGB 8.7* 8.5* 10.0* 9.7* 10.4*  HCT 27.4* 26.2* 30.3* 29.4* 32.0*  MCV 81.1 80.1 80.8 80.3 79.6*  PLT 783* 711* 834* 738* 706*   Cardiac Enzymes: No results for input(s): "CKTOTAL", "CKMB", "CKMBINDEX", "TROPONINI" in the last 168 hours. BNP: Invalid input(s): "POCBNP" CBG: Recent Labs  Lab 04/07/23 1136 04/07/23 1653 04/07/23 2146 04/08/23 0641 04/08/23 1209  GLUCAP 243* 174* 174* 142* 235*   D-Dimer No results for input(s): "DDIMER" in the last 72 hours. Hgb A1c No results for input(s): "HGBA1C" in the last 72 hours. Lipid Profile No results for input(s): "CHOL", "HDL", "LDLCALC", "TRIG", "CHOLHDL", "LDLDIRECT" in the last 72 hours. Thyroid function  studies No results for input(s): "TSH", "T4TOTAL", "T3FREE", "THYROIDAB" in the last 72 hours.  Invalid input(s): "FREET3" Anemia work up No results for input(s): "VITAMINB12", "FOLATE", "FERRITIN", "TIBC", "IRON", "RETICCTPCT" in the last 72 hours. Urinalysis    Component Value Date/Time   COLORURINE YELLOW 04/07/2023 1810   APPEARANCEUR CLEAR 04/07/2023 1810   LABSPEC 1.009 04/07/2023 1810   PHURINE 5.0 04/07/2023 1810   GLUCOSEU NEGATIVE 04/07/2023 1810   HGBUR NEGATIVE 04/07/2023 1810   BILIRUBINUR NEGATIVE 04/07/2023 1810   KETONESUR NEGATIVE 04/07/2023 1810   PROTEINUR NEGATIVE 04/07/2023 1810   NITRITE NEGATIVE 04/07/2023 1810   LEUKOCYTESUR NEGATIVE 04/07/2023 1810   Sepsis Labs Recent Labs  Lab 04/05/23 0500 04/06/23 0500 04/07/23 0425 04/08/23 0301  WBC 21.1* 28.8* 28.3* 37.8*   Microbiology Recent Results (from the past 240 hour(s))  Culture, blood (Routine X 2) w Reflex to ID Panel     Status: None   Collection Time: 04/02/23  9:26 AM   Specimen: BLOOD  Result Value Ref Range Status   Specimen Description BLOOD BLOOD RIGHT HAND  Final   Special Requests   Final    BOTTLES DRAWN AEROBIC AND ANAEROBIC Blood Culture results may not be optimal due to an inadequate volume of blood received in culture bottles   Culture   Final    NO GROWTH 5 DAYS Performed at [facility], [address], [place], [place] [address]    Report Status 04/07/2023 FINAL  Final  Culture, blood (Routine X 2) w Reflex to ID Panel     Status: None   Collection Time: 04/02/23  9:26 AM   Specimen: BLOOD  Result Value Ref Range Status   Specimen Description BLOOD BLOOD RIGHT HAND  Final   Special Requests   Final    BOTTLES DRAWN AEROBIC AND ANAEROBIC Blood Culture results may not be optimal due to an inadequate volume of blood received in culture bottles   Culture   Final    NO GROWTH 5 DAYS Performed at [facility], [address] [place], [place] [address]    Report  Status 04/07/2023 FINAL  Final     Time coordinating discharge: Over 30 minutes  SIGNED:   Willeen Niece, MD  [facility] 04/08/2023, 4:47 PM Pager   If 7PM-7AM, please contact night-coverage

## 2023-04-08 NOTE — H&P (Signed)
Physical Medicine and Rehabilitation Admission H&P   CC: Functional deficits secondary to debility  HPI: [name] [name] is a 77 year old male with a history of CHF and CKD who presented to the ED on 03/25/2023 complaining of ongoing right hip pain. He had fallen after syncopal episode on 11/04. Evaluated then in the ED. He had hit his head and complained of right shoulder and hip pain.  CT head, CTA head and CT of c-spine right ip x-rays were normal. Mild acromioclavicular widening with possible small fracture fragment arising from the distal clavicle. Findings suggestive of AC joint injury. He is maintained on Eliquis for pAF. Underwent TEE. Leukocytosis with negative cultures. Discharged home on 11/08.   He returned the hospital on 11/25 with increasing right hip pain. CT scan of right hip this admission showed retroperitoneal hematoma extending to right psoas and iliacus muscles. No surgical intervention was recommended. H and H stable. Eliquis resumed.  Noted leukocytosis and cultures obtained.  CT of the chest showed multi level groundglass infiltrates bilaterally.  Intravenous antibiotics initiated and is now on Augmentin to finish course.  He developed a pruritic skin rash and has been treated with Solu-Medrol, hydroxyzine and calamine lotion.  Was given prednisone taper.  Leukocytosis most likely due to steroids.  He has remained afebrile. Cardiology consulted for volume overload and nephrology consulted for worsening renal function.  Due to severe systolic CHF and volume overload, started on milrinone on 12/02.  He has a past history of coronary artery disease status post two-vessel CABG in 2018.  He is status post CCM insertion by Dr. Graciela Husbands in September, 2024.  Past medical history significant for chronic kidney disease and diabetes mellitus.  His single dose of metformin at home was discontinued prior to admission.  A1c is 6.9%.  He required midodrine for hypotension.  Milrinone discontinued  yesterday and torsemide resumed and Entresto remains held.  Additional lung nodule noted on CT in the right middle lobe.  Repeat CT in 3 months versus PET-CT.  Pt developed swelling in right lower leg with associated thigh pain over the last 24 hours or so. LE venous dopplers were ordered today prior to admit. Results are pending.    The patient lives at home with his wife and prior to his initial fall was ambulating with a rolling walker.  Right internal jugular tunneled PICC in place. The patient requires inpatient physical medicine and rehabilitation evaluations and services for ongoing dysfunction secondary to debility.  Wife at bedsides. Says he walked with PT once in the hallway.  Review of Systems  HENT:  Negative for sore throat.   Cardiovascular:  Negative for chest pain and palpitations.  Gastrointestinal:  Negative for constipation, nausea and vomiting.  Musculoskeletal:  Negative for back pain and neck pain.  Neurological:  Negative for dizziness and headaches.  Psychiatric/Behavioral:  Negative for depression. The patient does not have insomnia.    Past Medical History:  Diagnosis Date   AICD (automatic cardioverter/defibrillator) present    Arthritis    Bladder cancer (HCC)    Borderline glaucoma    Chronic systolic (congestive) heart failure (HCC)    Coronary artery disease    Diabetes mellitus type 2, controlled (HCC) ORAL MED   Essential hypertension    Heart murmur    Hepatitis    Medical history non-contributory    Paroxysmal A-fib (HCC)    Paroxysmal A-fib (HCC) 08/20/2020   Peripheral vascular disease (HCC)    Pneumonia    Past  Surgical History:  Procedure Laterality Date   CARDIOVERSION N/A 08/10/2021   Procedure: CARDIOVERSION;  Surgeon: Dolores Patty, MD;  Location: [facility] ENDOSCOPY;  Service: Cardiovascular;  Laterality: N/A;   CARDIOVERSION N/A 11/09/2022   Procedure: CARDIOVERSION;  Surgeon: Maisie Fus, MD;  Location: [facility] INVASIVE CV LAB;  Service:  Cardiovascular;  Laterality: N/A;   CARDIOVERSION N/A 03/07/2023   Procedure: CARDIOVERSION (CATH LAB);  Surgeon: Tessa Lerner, DO;  Location: [facility] INVASIVE CV LAB;  Service: Cardiovascular;  Laterality: N/A;   CAROTID DUPLEX SCAN  07-26-2010   BILATERAL ICA  STENOSIS 1% - 39%   CCM GENERATOR INSERTION N/A 01/04/2023   Procedure: CCM GENERATOR INSERTION;  Surgeon: Duke Salvia, MD;  Location: [facility] INVASIVE CV LAB;  Service: Cardiovascular;  Laterality: N/A;   CHOLECYSTECTOMY N/A 08/22/2020   Procedure: LAPAROSCOPIC CHOLECYSTECTOMY;  Surgeon: Emelia Loron, MD;  Location: [facility] OR;  Service: General;  Laterality: N/A;   CORONARY ARTERY BYPASS GRAFT N/A 04/08/2017   Procedure: CORONARY ARTERY BYPASS GRAFTING (CABG) TIMES TWO USING LEFT INTERNAL MAMMARY ARTERY AND LEFT SAPHENOUS LEG VEIN HARVESTED ENDOSCOPICALLY.  LEG VEIN ALSO HARVESTED FROM THE RIGHT LEG;  Surgeon: Alleen Borne, MD;  Location: [facility] OR;  Service: Open Heart Surgery;  Laterality: N/A;   CYSTOSCOPY WITH BIOPSY N/A 08/01/2012   Procedure: CYSTOSCOPY WITH BIOPSY BLADDER BIOPSY   ;  Surgeon: Antony Haste, MD;  Location: [facility];  Service: Urology;  Laterality: N/A;   ERCP N/A 08/21/2020   Procedure: ENDOSCOPIC RETROGRADE CHOLANGIOPANCREATOGRAPHY (ERCP);  Surgeon: Jeani Hawking, MD;  Location: [facility] ENDOSCOPY;  Service: Endoscopy;  Laterality: N/A;   FULGURATION OF BLADDER TUMOR N/A 08/01/2012   Procedure: FULGURATION OF BLADDER TUMOR;  Surgeon: Antony Haste, MD;  Location: [facility];  Service: Urology;  Laterality: N/A;   ICD IMPLANT  11/20/2017   ICD IMPLANT N/A 11/20/2017   Procedure: ICD IMPLANT;  Surgeon: Duke Salvia, MD;  Location: [facility] INVASIVE CV LAB;  Service: Cardiovascular;  Laterality: N/A;   IR FLUORO GUIDE CV LINE RIGHT  04/01/2023   IR US GUIDE VASC ACCESS RIGHT  04/01/2023   LEFT HEART CATH AND CORONARY ANGIOGRAPHY N/A 03/31/2017   Procedure: LEFT HEART CATH AND CORONARY  ANGIOGRAPHY;  Surgeon: Marykay Lex, MD;  Location: [facility] INVASIVE CV LAB;  Service: Cardiovascular;  Laterality: N/A;   LUMBAR LAMINECTOMY/DECOMPRESSION MICRODISCECTOMY Bilateral 06/16/2019   Procedure: Bilateral Lumbar Four-Five Laminectomy and Foraminotomy;  Surgeon: Julio Sicks, MD;  Location: [facility] OR;  Service: Neurosurgery;  Laterality: Bilateral;  posterior   REMOVAL OF STONES  08/21/2020   Procedure: REMOVAL OF STONES;  Surgeon: Jeani Hawking, MD;  Location: [facility] ENDOSCOPY;  Service: Endoscopy;;   RIGHT HEART CATH Right 03/31/2017   Procedure: RIGHT HEART CATH;  Surgeon: Marykay Lex, MD;  Location: [facility] INVASIVE CV LAB;  Service: Cardiovascular;  Laterality: Right;   SPHINCTEROTOMY  08/21/2020   Procedure: SPHINCTEROTOMY;  Surgeon: Jeani Hawking, MD;  Location: [facility] ENDOSCOPY;  Service: Endoscopy;;   TEE WITHOUT CARDIOVERSION N/A 04/08/2017   Procedure: TRANSESOPHAGEAL ECHOCARDIOGRAM (TEE);  Surgeon: Alleen Borne, MD;  Location: [facility] OR;  Service: Open Heart Surgery;  Laterality: N/A;   TRANSESOPHAGEAL ECHOCARDIOGRAM (CATH LAB) N/A 03/07/2023   Procedure: TRANSESOPHAGEAL ECHOCARDIOGRAM;  Surgeon: Tessa Lerner, DO;  Location: [facility] INVASIVE CV LAB;  Service: Cardiovascular;  Laterality: N/A;   TRANSURETHRAL RESECTION OF BLADDER TUMOR  05/29/2011   Procedure: TRANSURETHRAL RESECTION OF BLADDER TUMOR (TURBT);  Surgeon: Antony Haste, MD;  Location: [facility];  Service: Urology;  Laterality: N/A;   VENTRICULAR ASSIST DEVICE INSERTION Right 03/31/2017   Procedure: VENTRICULAR ASSIST DEVICE INSERTION;  Surgeon: Marykay Lex, MD;  Location: [facility] INVASIVE CV LAB;  Service: Cardiovascular;  Laterality: Right;   Family History  Problem Relation Age of Onset   Heart attack Sister    Heart disease Sister    Social History:  reports that he quit smoking about 6 years ago. His smoking use included cigarettes. He started smoking about 46 years ago. He has a 40 pack-year smoking  history. He has never used smokeless tobacco. He reports that he does not currently use alcohol. He reports that he does not use drugs. Allergies:  Allergies  Allergen Reactions   Spironolactone Other (See Comments)    Painful gynecomastia   Semaglutide     Nausea and bloating   Tramadol Other (See Comments)    Sleep disturbance.  Apnea spells.   Empagliflozin Rash    Rash at the groin Jardiance   Niacin And Related Hives   Medications Prior to Admission  Medication Sig Dispense Refill   acetaminophen (TYLENOL) 500 MG tablet Take 1,000 mg by mouth every 6 (six) hours as needed for mild pain (pain score 1-3), headache or fever.     amiodarone (PACERONE) 200 MG tablet Take 1 tablet (200 mg total) by mouth daily. (Patient taking differently: Take 200 mg by mouth 2 (two) times daily.)     atorvastatin (LIPITOR) 80 MG tablet TAKE 1 TABLET BY MOUTH EVERY DAY 90 tablet 3   cyanocobalamin (VITAMIN B12) 250 MCG tablet Take 500 mcg by mouth daily.     ELIQUIS 5 MG TABS tablet TAKE 1 TABLET TWICE A DAY (Patient taking differently: Take 5 mg by mouth 2 (two) times daily.) 180 tablet 3   oxymetazoline (DRISTAN) 0.05 % nasal spray Place 1 spray into both nostrils at bedtime.     sacubitril-valsartan (ENTRESTO) 49-51 MG Take 1 tablet by mouth 2 (two) times daily. 60 tablet 3   torsemide (DEMADEX) 20 MG tablet Take 1 tablet (20 mg total) by mouth daily. 90 tablet 3   zolpidem (AMBIEN) 10 MG tablet Take 5 mg by mouth at bedtime.        Home: Home Living Family/patient expects to be discharged to:: Private residence Living Arrangements: Spouse/significant other Available Help at Discharge: Family, Available 24 hours/day Type of Home: House Home Access: Stairs to enter [employer] of Steps: 2 Entrance Stairs-Rails: None Home Layout: Two level Alternate Level Stairs-Number of Steps: flight Alternate Level Stairs-Rails: Left Bathroom Shower/Tub: Psychologist, counselling, Teacher, music: Handicapped height Bathroom Accessibility: Yes Home Equipment: Agricultural consultant (2 wheels), [employer] - single point, Information systems manager  Lives With: Spouse   Functional History: Prior Function Prior Level of Function : Independent/Modified Independent, History of Falls (last six months) Mobility Comments: uses RW past few weeks after the fall. Increased pain in R hip limiting mobility ADLs Comments: independent  Functional Status:  Mobility: Bed Mobility Overal bed mobility: Needs Assistance Bed Mobility: Rolling, Sidelying to Sit, Sit to Supine Rolling: Supervision, Used rails Sidelying to sit: Min assist Supine to sit: Min assist Sit to supine: Supervision Sit to sidelying: Mod assist, Used rails General bed mobility comments: pt. reports having bed rail at home so utilized bed rail to simulate home env. pt. exited on R side. able to manage bles without assistance but required light trunk support for coming upright.  for back to  bed pt. states he does not scoot towards hob as rail is in the way so he demonstrated how he gets into bed he scoots backwards first then laid back and brought bles into bed without assistance.  heavy reliance on using bed controls to elevated knees/feet. reviewed if he does not have these functions on his bed to try and find ways to be comfortable without.  he states "ill just stack pillows" Transfers Overall transfer level: Needs assistance Equipment used: Rolling walker (2 wheels) Transfers: Sit to/from Stand Sit to Stand: Min assist, +2 safety/equipment Bed to/from chair/wheelchair/BSC transfer type:: Step pivot Step pivot transfers: Min assist, +2 physical assistance General transfer comment: min A from elevated bed and from low recliner. vc's for hand placement Ambulation/Gait Ambulation/Gait assistance: Min assist, +2 safety/equipment Gait Distance (Feet): 60 Feet (30' x2) Assistive device: Rolling walker (2 wheels) Gait  Pattern/deviations: Step-to pattern, Decreased stride length, Shuffle General Gait Details: pt needed seated rest break at 20'. Ambulated without O2 first 20' with SPO2 dropping to 87%. Ambulated back on 2L with sats remaining low 90's. Pt with occasional buckle of RLE, min A for safety, +2 for chair behind Gait velocity: decreased Gait velocity interpretation: <1.31 ft/sec, indicative of household ambulator    ADL: ADL Overall ADL's : Needs assistance/impaired Eating/Feeding: Modified independent Grooming: Set up, Sitting Lower Body Dressing Details (indicate cue type and reason): seated eob, able to reach LLE but not R. states at home he has slip on sneakers and does not wear socks so does not need to access his feet.  when reviewing pants/underwear pt. reports he does not wear underwear and lays pants/shorts on floor and slips feet into them then pulls them up. Toilet Transfer: Moderate assistance, Stand-pivot, Rolling walker (2 wheels), BSC/3in1 Toilet Transfer Details (indicate cue type and reason): simulated General ADL Comments: bed mobility and lb dressing review  Cognition: Cognition Overall Cognitive Status: Within Functional Limits for tasks assessed Orientation Level: Oriented X4 Cognition Arousal: Alert Behavior During Therapy: WFL for tasks assessed/performed Overall Cognitive Status: Within Functional Limits for tasks assessed Area of Impairment: Safety/judgement, Problem solving Safety/Judgement: Decreased awareness of safety Problem Solving: Requires verbal cues, Difficulty sequencing General Comments: mildly jittery and impulsive, cues needed to take his time.  Physical Exam: Blood pressure 119/66, pulse 93, temperature 97.6 F (36.4 C), temperature source Oral, resp. rate 16, height 6\' 2"  (1.88 m), weight 86.8 kg, SpO2 97%. Physical Exam Constitutional:      General: He is not in acute distress. HENT:     Head: Normocephalic.     Right Ear: External ear normal.      Left Ear: External ear normal.     Mouth/Throat:     Mouth: Mucous membranes are moist.  Eyes:     Extraocular Movements: Extraocular movements intact.     Pupils: Pupils are equal, round, and reactive to light.  Cardiovascular:     Rate and Rhythm: Normal rate and regular rhythm.     Heart sounds: No murmur heard.    No gallop.  Pulmonary:     Effort: Pulmonary effort is normal. No respiratory distress.     Breath sounds: Normal breath sounds. No wheezing.  Abdominal:     General: Bowel sounds are normal. There is no distension.     Palpations: Abdomen is soft.     Tenderness: There is no abdominal tenderness.  Musculoskeletal:     Cervical back: Normal range of motion.     Right lower leg: Edema  present.     Left lower leg: No edema.     Comments: Right quads/anterior thigh are tender to simple palpation. No thigh swelling is obvious although lower RLE with 1+ edema.   Neurological:     Mental Status: He is alert and oriented to person, place, and time.     Comments: Alert and oriented x 3. Normal insight and awareness. Intact Memory. Normal language and speech. Cranial nerve exam unremarkable. MMT: UE 3+ to 4/5 prox to distal bilaterally. RLE: 1/5 HF/KE partially due to pain and 3/5 ADF/PF. LLE 2+ to 3/5 HF, KE and 4/5 ADF/PF. Sensory exam normal for light touch and pain in all 4 limbs. No limb ataxia or cerebellar signs. No abnormal tone appreciated.      Psychiatric:        Mood and Affect: Mood normal.        Behavior: Behavior normal.     Results for orders placed or performed during the hospital encounter of 03/25/23 (from the past 48 hour(s))  Glucose, capillary     Status: Abnormal   Collection Time: 04/06/23 11:44 AM  Result Value Ref Range   Glucose-Capillary 439 (H) 70 - 99 mg/dL    Comment: Glucose reference range applies only to samples taken after fasting for at least 8 hours.  Glucose, capillary     Status: Abnormal   Collection Time: 04/06/23  3:20 PM   Result Value Ref Range   Glucose-Capillary 283 (H) 70 - 99 mg/dL    Comment: Glucose reference range applies only to samples taken after fasting for at least 8 hours.  Glucose, capillary     Status: Abnormal   Collection Time: 04/06/23  9:23 PM  Result Value Ref Range   Glucose-Capillary 228 (H) 70 - 99 mg/dL    Comment: Glucose reference range applies only to samples taken after fasting for at least 8 hours.   Comment 1 Notify RN    Comment 2 Document in Chart   Cooxemetry Panel (carboxy, met, total hgb, O2 sat)     Status: Abnormal   Collection Time: 04/07/23  4:25 AM  Result Value Ref Range   Total hemoglobin 9.8 (L) 12.0 - 16.0 g/dL   O2 Saturation 47.4 %   Carboxyhemoglobin 1.5 0.5 - 1.5 %   Methemoglobin <0.7 0.0 - 1.5 %    Comment: Performed at [facility], [address] [place], [place] [address]  CBC     Status: Abnormal   Collection Time: 04/07/23  4:25 AM  Result Value Ref Range   WBC 28.3 (H) 4.0 - 10.5 K/uL   RBC 3.66 (L) 4.22 - 5.81 MIL/uL   Hemoglobin 9.7 (L) 13.0 - 17.0 g/dL   HCT 38.7 (L) 56.4 - 33.2 %   MCV 80.3 80.0 - 100.0 fL   MCH 26.5 26.0 - 34.0 pg   MCHC 33.0 30.0 - 36.0 g/dL   RDW 95.1 (H) 88.4 - 16.6 %   Platelets 738 (H) 150 - 400 K/uL   nRBC 0.0 0.0 - 0.2 %    Comment: Performed at [facility], [address] [place], [place] [address]  Basic metabolic panel     Status: Abnormal   Collection Time: 04/07/23  4:25 AM  Result Value Ref Range   Sodium 134 (L) 135 - 145 mmol/L   Potassium 3.8 3.5 - 5.1 mmol/L   Chloride 98 98 - 111 mmol/L   CO2 26 22 - 32 mmol/L  Glucose, Bld 141 (H) 70 - 99 mg/dL    Comment: Glucose reference range applies only to samples taken after fasting for at least 8 hours.   BUN 53 (H) 8 - 23 mg/dL   Creatinine, Ser 3.87 (H) 0.61 - 1.24 mg/dL   Calcium 7.8 (L) 8.9 - 10.3 mg/dL   GFR, Estimated 30 (L) >60 mL/min    Comment: (NOTE) Calculated using the CKD-EPI Creatinine Equation (2021)    Anion gap 10 5  - 15    Comment: Performed at [facility], [address], [place], [place] [address]  Glucose, capillary     Status: Abnormal   Collection Time: 04/07/23  6:00 AM  Result Value Ref Range   Glucose-Capillary 128 (H) 70 - 99 mg/dL    Comment: Glucose reference range applies only to samples taken after fasting for at least 8 hours.   Comment 1 Notify RN    Comment 2 Document in Chart   Glucose, capillary     Status: Abnormal   Collection Time: 04/07/23 11:36 AM  Result Value Ref Range   Glucose-Capillary 243 (H) 70 - 99 mg/dL    Comment: Glucose reference range applies only to samples taken after fasting for at least 8 hours.  Glucose, capillary     Status: Abnormal   Collection Time: 04/07/23  4:53 PM  Result Value Ref Range   Glucose-Capillary 174 (H) 70 - 99 mg/dL    Comment: Glucose reference range applies only to samples taken after fasting for at least 8 hours.  Urinalysis, Routine w reflex microscopic -Urine, Clean Catch     Status: None   Collection Time: 04/07/23  6:10 PM  Result Value Ref Range   Color, Urine YELLOW YELLOW   APPearance CLEAR CLEAR   Specific Gravity, Urine 1.009 1.005 - 1.030   pH 5.0 5.0 - 8.0   Glucose, UA NEGATIVE NEGATIVE mg/dL   Hgb urine dipstick NEGATIVE NEGATIVE   Bilirubin Urine NEGATIVE NEGATIVE   Ketones, ur NEGATIVE NEGATIVE mg/dL   Protein, ur NEGATIVE NEGATIVE mg/dL   Nitrite NEGATIVE NEGATIVE   Leukocytes,Ua NEGATIVE NEGATIVE    Comment: Performed at [facility], [address], [place], [place] [address]  Glucose, capillary     Status: Abnormal   Collection Time: 04/07/23  9:46 PM  Result Value Ref Range   Glucose-Capillary 174 (H) 70 - 99 mg/dL    Comment: Glucose reference range applies only to samples taken after fasting for at least 8 hours.  CBC     Status: Abnormal   Collection Time: 04/08/23  3:01 AM  Result Value Ref Range   WBC 37.8 (H) 4.0 - 10.5 K/uL   RBC 4.02 (L) 4.22 - 5.81 MIL/uL   Hemoglobin 10.4 (L)  13.0 - 17.0 g/dL   HCT 84.1 (L) 66.0 - 63.0 %   MCV 79.6 (L) 80.0 - 100.0 fL   MCH 25.9 (L) 26.0 - 34.0 pg   MCHC 32.5 30.0 - 36.0 g/dL   RDW 16.0 (H) 10.9 - 32.3 %   Platelets 706 (H) 150 - 400 K/uL   nRBC 0.0 0.0 - 0.2 %    Comment: Performed at [facility], [address], [place], [place] [address]  Basic metabolic panel     Status: Abnormal   Collection Time: 04/08/23  3:01 AM  Result Value Ref Range   Sodium 133 (L) 135 - 145 mmol/L   Potassium 3.5 3.5 - 5.1 mmol/L  Chloride 96 (L) 98 - 111 mmol/L   CO2 26 22 - 32 mmol/L   Glucose, Bld 164 (H) 70 - 99 mg/dL    Comment: Glucose reference range applies only to samples taken after fasting for at least 8 hours.   BUN 47 (H) 8 - 23 mg/dL   Creatinine, Ser 5.18 (H) 0.61 - 1.24 mg/dL   Calcium 7.7 (L) 8.9 - 10.3 mg/dL   GFR, Estimated 32 (L) >60 mL/min    Comment: (NOTE) Calculated using the CKD-EPI Creatinine Equation (2021)    Anion gap 11 5 - 15    Comment: Performed at [facility], [address], [place], [place] [address]  Glucose, capillary     Status: Abnormal   Collection Time: 04/08/23  6:41 AM  Result Value Ref Range   Glucose-Capillary 142 (H) 70 - 99 mg/dL    Comment: Glucose reference range applies only to samples taken after fasting for at least 8 hours.   Comment 1 Notify RN    No results found.    Blood pressure 119/66, pulse 93, temperature 97.6 F (36.4 C), temperature source Oral, resp. rate 16, height 6\' 2"  (1.88 m), weight 86.8 kg, SpO2 97%.  Medical Problem List and Plan: 1. Functional deficits secondary to retroperitoneal hematoma after recent fall.  -also right right AC jt injury, ?clavicle fx  -patient may  shower  -ELOS/Goals: 10-12 days, supervision goals with PT, OT  2.  Antithrombotics: -DVT/anticoagulation:  Pharmaceutical: Eliquis  -antiplatelet therapy: none  -pt with new right lower extremity edema and thigh pain. Dopplers pending   -eliquis recently resumed, so  findings won't change medical mgt 3. Pain Management: Tylenol, oxycodone as needed for hip   -right thigh pain. ?muscle strain. No sensory findings   -add prn robaxin   -order kpad  4. Mood/Behavior/Sleep: LCSW to evaluate and provide emotional support  -continue melatonin 10 mg   -antipsychotic agents: n/a  5. Neuropsych/cognition: This patient is capable of making decisions on his own behalf.  6. Skin/Wound Care: Routine skin care checks   7. Fluids/Electrolytes/Nutrition: Strict Is and Os and follow-up chemistries  8: Hypertension: monitor TID and prn (Entresto, torsemide held)  -currently on midodrine 10 mg TID for hypotension  9:  Cardiorenal syndrome/acute kidney Injury on chronic kidney disease stage IIIb (baseline creatinine around 2)   -milrinone discontinued 12/08  -no indication for HD  -daily weight  -continue sodium bicarbonate 650 mg BID  -continue torsemide 20 mg daily  10: Fall/right retroperitoneal hematoma: now back on Eliquis; H and H stable  -follow-up CBC  11: Atypical atrial flutter; hx of pAF: rate controlled (low 100s)  -on Eliquis, amiodarone  12: COPD: former tobacco smoker, quit 2018  13: DM-2: CBGs QID; A1c = 6.9% (home on Ozempic; metformin discontinued due to CKD)  -continue SSI  14: CAD: s/p 2-vessel CABG 2018  -restart statin once LFTs normalized  -follow-up CMP  15: Acute on chronic systolic and diastolic heart failure, hx of stage D systolic heart failure post CCM  -continue amiodarone 200 mg daily  -continue torsemide 20 mg daily  16: Elevated transaminases, up trending: ? due to congestive hepatopathy  -follow-up CMP  17: Multiple falls; AC joint injury/possible right distal clavicular fracture 11/05  18: Possible pneumonia: continue Augmentin to finish the course  19: Leukocytosis, chronic: negative infectious work-up except possible PNA; had short course steroids for skin rash  -appears to be off prednisone now  -on Augmentin  (end 12/11)  -  follow-up CBC with diff on 12/10  20: Lung nodule: follow-up outpatient with PCP  21: Hx of bladder cancer s/p TURP 2013  22: Skin rash treated with prednisone/hydroxyzine/calamine lotion  -appears prednisone has been discontinued  23: Chronic anemia: H and H stable times 72 hours  -follow-up CBC  24: Thrombocytosis; fluctuating: follow-up CBC     Milinda Antis, PA-C 04/08/2023

## 2023-04-08 NOTE — Progress Notes (Signed)
PT Cancellation Note  Patient Details Name: [name] [name] MRN: [id number] DOB: 25-Jan-1946   Cancelled Treatment:    Reason Eval/Treat Not Completed: Patient declined, no reason specified; reports leg hurts when he moves and planned d/c to rehab this pm.  Encouraged we could get him some medication, though pt preferred to wait for therapy to start on rehab in am.    Elray Mcgregor 04/08/2023, 4:50 PM Sheran Lawless, PT Acute Rehabilitation Services Office:[phone] 04/08/2023

## 2023-04-08 NOTE — Progress Notes (Signed)
Inpatient Rehab Admissions Coordinator:    I have a CIR bed for this Pt. Today. RN may call report to [phone]  Pt. To admit to CIR for an estimated 10-12 days with the goal of discharging home with assist from his wife.   Megan Salon, MS, CCC-SLP Rehab Admissions Coordinator  [phone] (celll) [phone] (office)

## 2023-04-08 NOTE — H&P (Signed)
Physical Medicine and Rehabilitation Admission H&P     CC: Functional deficits secondary to debility   HPI: [name] [name] is a 77 year old male with a history of CHF and CKD who presented to the ED on 03/25/2023 complaining of ongoing right hip pain. He had fallen after syncopal episode on 11/04. Evaluated then in the ED. He had hit his head and complained of right shoulder and hip pain.  CT head, CTA head and CT of c-spine right ip x-rays were normal. Mild acromioclavicular widening with possible small fracture fragment arising from the distal clavicle. Findings suggestive of AC joint injury. He is maintained on Eliquis for pAF. Underwent TEE. Leukocytosis with negative cultures. Discharged home on 11/08.   He returned the hospital on 11/25 with increasing right hip pain. CT scan of right hip this admission showed retroperitoneal hematoma extending to right psoas and iliacus muscles. No surgical intervention was recommended. H and H stable. Eliquis resumed.  Noted leukocytosis and cultures obtained.  CT of the chest showed multi level groundglass infiltrates bilaterally.  Intravenous antibiotics initiated and is now on Augmentin to finish course.  He developed a pruritic skin rash and has been treated with Solu-Medrol, hydroxyzine and calamine lotion.  Was given prednisone taper.  Leukocytosis most likely due to steroids.  He has remained afebrile. Cardiology consulted for volume overload and nephrology consulted for worsening renal function.  Due to severe systolic CHF and volume overload, started on milrinone on 12/02.  He has a past history of coronary artery disease status post two-vessel CABG in 2018.  He is status post CCM insertion by Dr. Graciela Husbands in September, 2024.  Past medical history significant for chronic kidney disease and diabetes mellitus.  His single dose of metformin at home was discontinued prior to admission.  A1c is 6.9%.  He required midodrine for hypotension.  Milrinone discontinued  yesterday and torsemide resumed and Entresto remains held.  Additional lung nodule noted on CT in the right middle lobe.  Repeat CT in 3 months versus PET-CT.  Pt developed swelling in right lower leg with associated thigh pain over the last 24 hours or so. LE venous dopplers were ordered today prior to admit. Results are pending.      The patient lives at home with his wife and prior to his initial fall was ambulating with a rolling walker.  Right internal jugular tunneled PICC in place. The patient requires inpatient physical medicine and rehabilitation evaluations and services for ongoing dysfunction secondary to debility.   Wife at bedsides. Says he walked with PT once in the hallway.   Review of Systems  HENT:  Negative for sore throat.   Cardiovascular:  Negative for chest pain and palpitations.  Gastrointestinal:  Negative for constipation, nausea and vomiting.  Musculoskeletal:  Negative for back pain and neck pain.  Neurological:  Negative for dizziness and headaches.  Psychiatric/Behavioral:  Negative for depression. The patient does not have insomnia.         Past Medical History:  Diagnosis Date   AICD (automatic cardioverter/defibrillator) present     Arthritis     Bladder cancer (HCC)     Borderline glaucoma     Chronic systolic (congestive) heart failure (HCC)     Coronary artery disease     Diabetes mellitus type 2, controlled (HCC) ORAL MED   Essential hypertension     Heart murmur     Hepatitis     Medical history non-contributory     Paroxysmal A-fib (  HCC)     Paroxysmal A-fib (HCC) 08/20/2020   Peripheral vascular disease (HCC)     Pneumonia               Past Surgical History:  Procedure Laterality Date   CARDIOVERSION N/A 08/10/2021    Procedure: CARDIOVERSION;  Surgeon: Dolores Patty, MD;  Location: [facility] ENDOSCOPY;  Service: Cardiovascular;  Laterality: N/A;   CARDIOVERSION N/A 11/09/2022    Procedure: CARDIOVERSION;  Surgeon: Maisie Fus, MD;   Location: [facility] INVASIVE CV LAB;  Service: Cardiovascular;  Laterality: N/A;   CARDIOVERSION N/A 03/07/2023    Procedure: CARDIOVERSION (CATH LAB);  Surgeon: Tessa Lerner, DO;  Location: [facility] INVASIVE CV LAB;  Service: Cardiovascular;  Laterality: N/A;   CAROTID DUPLEX SCAN   07-26-2010    BILATERAL ICA  STENOSIS 1% - 39%   CCM GENERATOR INSERTION N/A 01/04/2023    Procedure: CCM GENERATOR INSERTION;  Surgeon: Duke Salvia, MD;  Location: [facility] INVASIVE CV LAB;  Service: Cardiovascular;  Laterality: N/A;   CHOLECYSTECTOMY N/A 08/22/2020    Procedure: LAPAROSCOPIC CHOLECYSTECTOMY;  Surgeon: Emelia Loron, MD;  Location: [facility] OR;  Service: General;  Laterality: N/A;   CORONARY ARTERY BYPASS GRAFT N/A 04/08/2017    Procedure: CORONARY ARTERY BYPASS GRAFTING (CABG) TIMES TWO USING LEFT INTERNAL MAMMARY ARTERY AND LEFT SAPHENOUS LEG VEIN HARVESTED ENDOSCOPICALLY.  LEG VEIN ALSO HARVESTED FROM THE RIGHT LEG;  Surgeon: Alleen Borne, MD;  Location: [facility] OR;  Service: Open Heart Surgery;  Laterality: N/A;   CYSTOSCOPY WITH BIOPSY N/A 08/01/2012    Procedure: CYSTOSCOPY WITH BIOPSY BLADDER BIOPSY   ;  Surgeon: Antony Haste, MD;  Location: [facility];  Service: Urology;  Laterality: N/A;   ERCP N/A 08/21/2020    Procedure: ENDOSCOPIC RETROGRADE CHOLANGIOPANCREATOGRAPHY (ERCP);  Surgeon: Jeani Hawking, MD;  Location: [facility] ENDOSCOPY;  Service: Endoscopy;  Laterality: N/A;   FULGURATION OF BLADDER TUMOR N/A 08/01/2012    Procedure: FULGURATION OF BLADDER TUMOR;  Surgeon: Antony Haste, MD;  Location: [facility];  Service: Urology;  Laterality: N/A;   ICD IMPLANT   11/20/2017   ICD IMPLANT N/A 11/20/2017    Procedure: ICD IMPLANT;  Surgeon: Duke Salvia, MD;  Location: [facility] INVASIVE CV LAB;  Service: Cardiovascular;  Laterality: N/A;   IR FLUORO GUIDE CV LINE RIGHT   04/01/2023   IR US GUIDE VASC ACCESS RIGHT   04/01/2023   LEFT HEART CATH AND CORONARY ANGIOGRAPHY N/A  03/31/2017    Procedure: LEFT HEART CATH AND CORONARY ANGIOGRAPHY;  Surgeon: Marykay Lex, MD;  Location: [facility] INVASIVE CV LAB;  Service: Cardiovascular;  Laterality: N/A;   LUMBAR LAMINECTOMY/DECOMPRESSION MICRODISCECTOMY Bilateral 06/16/2019    Procedure: Bilateral Lumbar Four-Five Laminectomy and Foraminotomy;  Surgeon: Julio Sicks, MD;  Location: [facility] OR;  Service: Neurosurgery;  Laterality: Bilateral;  posterior   REMOVAL OF STONES   08/21/2020    Procedure: REMOVAL OF STONES;  Surgeon: Jeani Hawking, MD;  Location: [facility] ENDOSCOPY;  Service: Endoscopy;;   RIGHT HEART CATH Right 03/31/2017    Procedure: RIGHT HEART CATH;  Surgeon: Marykay Lex, MD;  Location: [facility] INVASIVE CV LAB;  Service: Cardiovascular;  Laterality: Right;   SPHINCTEROTOMY   08/21/2020    Procedure: SPHINCTEROTOMY;  Surgeon: Jeani Hawking, MD;  Location: [facility] ENDOSCOPY;  Service: Endoscopy;;   TEE WITHOUT CARDIOVERSION N/A 04/08/2017    Procedure: TRANSESOPHAGEAL ECHOCARDIOGRAM (TEE);  Surgeon: Alleen Borne, MD;  Location: [facility] OR;  Service: Open Heart  Surgery;  Laterality: N/A;   TRANSESOPHAGEAL ECHOCARDIOGRAM (CATH LAB) N/A 03/07/2023    Procedure: TRANSESOPHAGEAL ECHOCARDIOGRAM;  Surgeon: Tessa Lerner, DO;  Location: [facility] INVASIVE CV LAB;  Service: Cardiovascular;  Laterality: N/A;   TRANSURETHRAL RESECTION OF BLADDER TUMOR   05/29/2011    Procedure: TRANSURETHRAL RESECTION OF BLADDER TUMOR (TURBT);  Surgeon: Antony Haste, MD;  Location: [facility];  Service: Urology;  Laterality: N/A;   VENTRICULAR ASSIST DEVICE INSERTION Right 03/31/2017    Procedure: VENTRICULAR ASSIST DEVICE INSERTION;  Surgeon: Marykay Lex, MD;  Location: [facility] INVASIVE CV LAB;  Service: Cardiovascular;  Laterality: Right;             Family History  Problem Relation Age of Onset   Heart attack Sister     Heart disease Sister          Social History:  reports that he quit smoking about 6 years ago. His smoking use  included cigarettes. He started smoking about 46 years ago. He has a 40 pack-year smoking history. He has never used smokeless tobacco. He reports that he does not currently use alcohol. He reports that he does not use drugs. Allergies:  Allergies       Allergies  Allergen Reactions   Spironolactone Other (See Comments)      Painful gynecomastia   Semaglutide        Nausea and bloating   Tramadol Other (See Comments)      Sleep disturbance.  Apnea spells.   Empagliflozin Rash      Rash at the groin Jardiance   Niacin And Related Hives            Medications Prior to Admission  Medication Sig Dispense Refill   acetaminophen (TYLENOL) 500 MG tablet Take 1,000 mg by mouth every 6 (six) hours as needed for mild pain (pain score 1-3), headache or fever.       amiodarone (PACERONE) 200 MG tablet Take 1 tablet (200 mg total) by mouth daily. (Patient taking differently: Take 200 mg by mouth 2 (two) times daily.)       atorvastatin (LIPITOR) 80 MG tablet TAKE 1 TABLET BY MOUTH EVERY DAY 90 tablet 3   cyanocobalamin (VITAMIN B12) 250 MCG tablet Take 500 mcg by mouth daily.       ELIQUIS 5 MG TABS tablet TAKE 1 TABLET TWICE A DAY (Patient taking differently: Take 5 mg by mouth 2 (two) times daily.) 180 tablet 3   oxymetazoline (DRISTAN) 0.05 % nasal spray Place 1 spray into both nostrils at bedtime.       sacubitril-valsartan (ENTRESTO) 49-51 MG Take 1 tablet by mouth 2 (two) times daily. 60 tablet 3   torsemide (DEMADEX) 20 MG tablet Take 1 tablet (20 mg total) by mouth daily. 90 tablet 3   zolpidem (AMBIEN) 10 MG tablet Take 5 mg by mouth at bedtime.                  Home: Home Living Family/patient expects to be discharged to:: Private residence Living Arrangements: Spouse/significant other Available Help at Discharge: Family, Available 24 hours/day Type of Home: House Home Access: Stairs to enter [employer] of Steps: 2 Entrance Stairs-Rails: None Home Layout: Two  level Alternate Level Stairs-Number of Steps: flight Alternate Level Stairs-Rails: Left Bathroom Shower/Tub: Psychologist, counselling, Tub/shower unit Bathroom Toilet: Handicapped height Bathroom Accessibility: Yes Home Equipment: Agricultural consultant (2 wheels), [employer] - single point, Information systems manager  Lives With: Spouse   Functional History:  Prior Function Prior Level of Function : Independent/Modified Independent, History of Falls (last six months) Mobility Comments: uses RW past few weeks after the fall. Increased pain in R hip limiting mobility ADLs Comments: independent   Functional Status:  Mobility: Bed Mobility Overal bed mobility: Needs Assistance Bed Mobility: Rolling, Sidelying to Sit, Sit to Supine Rolling: Supervision, Used rails Sidelying to sit: Min assist Supine to sit: Min assist Sit to supine: Supervision Sit to sidelying: Mod assist, Used rails General bed mobility comments: pt. reports having bed rail at home so utilized bed rail to simulate home env. pt. exited on R side. able to manage bles without assistance but required light trunk support for coming upright.  for back to bed pt. states he does not scoot towards hob as rail is in the way so he demonstrated how he gets into bed he scoots backwards first then laid back and brought bles into bed without assistance.  heavy reliance on using bed controls to elevated knees/feet. reviewed if he does not have these functions on his bed to try and find ways to be comfortable without.  he states "ill just stack pillows" Transfers Overall transfer level: Needs assistance Equipment used: Rolling walker (2 wheels) Transfers: Sit to/from Stand Sit to Stand: Min assist, +2 safety/equipment Bed to/from chair/wheelchair/BSC transfer type:: Step pivot Step pivot transfers: Min assist, +2 physical assistance General transfer comment: min A from elevated bed and from low recliner. vc's for hand placement Ambulation/Gait Ambulation/Gait assistance:  Min assist, +2 safety/equipment Gait Distance (Feet): 60 Feet (30' x2) Assistive device: Rolling walker (2 wheels) Gait Pattern/deviations: Step-to pattern, Decreased stride length, Shuffle General Gait Details: pt needed seated rest break at 20'. Ambulated without O2 first 20' with SPO2 dropping to 87%. Ambulated back on 2L with sats remaining low 90's. Pt with occasional buckle of RLE, min A for safety, +2 for chair behind Gait velocity: decreased Gait velocity interpretation: <1.31 ft/sec, indicative of household ambulator   ADL: ADL Overall ADL's : Needs assistance/impaired Eating/Feeding: Modified independent Grooming: Set up, Sitting Lower Body Dressing Details (indicate cue type and reason): seated eob, able to reach LLE but not R. states at home he has slip on sneakers and does not wear socks so does not need to access his feet.  when reviewing pants/underwear pt. reports he does not wear underwear and lays pants/shorts on floor and slips feet into them then pulls them up. Toilet Transfer: Moderate assistance, Stand-pivot, Rolling walker (2 wheels), BSC/3in1 Toilet Transfer Details (indicate cue type and reason): simulated General ADL Comments: bed mobility and lb dressing review   Cognition: Cognition Overall Cognitive Status: Within Functional Limits for tasks assessed Orientation Level: Oriented X4 Cognition Arousal: Alert Behavior During Therapy: WFL for tasks assessed/performed Overall Cognitive Status: Within Functional Limits for tasks assessed Area of Impairment: Safety/judgement, Problem solving Safety/Judgement: Decreased awareness of safety Problem Solving: Requires verbal cues, Difficulty sequencing General Comments: mildly jittery and impulsive, cues needed to take his time.   Physical Exam: Blood pressure 119/66, pulse 93, temperature 97.6 F (36.4 C), temperature source Oral, resp. rate 16, height 6\' 2"  (1.88 m), weight 86.8 kg, SpO2 97%. Physical  Exam Constitutional:      General: He is not in acute distress. HENT:     Head: Normocephalic.     Right Ear: External ear normal.     Left Ear: External ear normal.     Mouth/Throat:     Mouth: Mucous membranes are moist.  Eyes:  Extraocular Movements: Extraocular movements intact.     Pupils: Pupils are equal, round, and reactive to light.  Cardiovascular:     Rate and Rhythm: Normal rate and regular rhythm.     Heart sounds: No murmur heard.    No gallop.  Pulmonary:     Effort: Pulmonary effort is normal. No respiratory distress.     Breath sounds: Normal breath sounds. No wheezing.  Abdominal:     General: Bowel sounds are normal. There is no distension.     Palpations: Abdomen is soft.     Tenderness: There is no abdominal tenderness.  Musculoskeletal:     Cervical back: Normal range of motion.     Right lower leg: Edema present.     Left lower leg: No edema.     Comments: Right quads/anterior thigh are tender to simple palpation. No thigh swelling is obvious although lower RLE with 1+ edema.   Neurological:     Mental Status: He is alert and oriented to person, place, and time.     Comments: Alert and oriented x 3. Normal insight and awareness. Intact Memory. Normal language and speech. Cranial nerve exam unremarkable. MMT: UE 3+ to 4/5 prox to distal bilaterally. RLE: 1/5 HF/KE partially due to pain and 3/5 ADF/PF. LLE 2+ to 3/5 HF, KE and 4/5 ADF/PF. Sensory exam normal for light touch and pain in all 4 limbs. No limb ataxia or cerebellar signs. No abnormal tone appreciated.      Psychiatric:        Mood and Affect: Mood normal.        Behavior: Behavior normal.        Lab Results Last 48 Hours        Results for orders placed or performed during the hospital encounter of 03/25/23 (from the past 48 hour(s))  Glucose, capillary     Status: Abnormal    Collection Time: 04/06/23 11:44 AM  Result Value Ref Range    Glucose-Capillary 439 (H) 70 - 99 mg/dL       Comment: Glucose reference range applies only to samples taken after fasting for at least 8 hours.  Glucose, capillary     Status: Abnormal    Collection Time: 04/06/23  3:20 PM  Result Value Ref Range    Glucose-Capillary 283 (H) 70 - 99 mg/dL      Comment: Glucose reference range applies only to samples taken after fasting for at least 8 hours.  Glucose, capillary     Status: Abnormal    Collection Time: 04/06/23  9:23 PM  Result Value Ref Range    Glucose-Capillary 228 (H) 70 - 99 mg/dL      Comment: Glucose reference range applies only to samples taken after fasting for at least 8 hours.    Comment 1 Notify RN      Comment 2 Document in Chart    Cooxemetry Panel (carboxy, met, total hgb, O2 sat)     Status: Abnormal    Collection Time: 04/07/23  4:25 AM  Result Value Ref Range    Total hemoglobin 9.8 (L) 12.0 - 16.0 g/dL    O2 Saturation 56.2 %    Carboxyhemoglobin 1.5 0.5 - 1.5 %    Methemoglobin <0.7 0.0 - 1.5 %      Comment: Performed at [facility], [address] [place], [place] [address]  CBC     Status: Abnormal    Collection Time: 04/07/23  4:25 AM  Result Value  Ref Range    WBC 28.3 (H) 4.0 - 10.5 K/uL    RBC 3.66 (L) 4.22 - 5.81 MIL/uL    Hemoglobin 9.7 (L) 13.0 - 17.0 g/dL    HCT 40.9 (L) 81.1 - 52.0 %    MCV 80.3 80.0 - 100.0 fL    MCH 26.5 26.0 - 34.0 pg    MCHC 33.0 30.0 - 36.0 g/dL    RDW 91.4 (H) 78.2 - 15.5 %    Platelets 738 (H) 150 - 400 K/uL    nRBC 0.0 0.0 - 0.2 %      Comment: Performed at [facility], [address] [place], [place] [address]  Basic metabolic panel     Status: Abnormal    Collection Time: 04/07/23  4:25 AM  Result Value Ref Range    Sodium 134 (L) 135 - 145 mmol/L    Potassium 3.8 3.5 - 5.1 mmol/L    Chloride 98 98 - 111 mmol/L    CO2 26 22 - 32 mmol/L    Glucose, Bld 141 (H) 70 - 99 mg/dL      Comment: Glucose reference range applies only to samples taken after fasting for at least 8 hours.    BUN 53 (H) 8 - 23  mg/dL    Creatinine, Ser 3.08 (H) 0.61 - 1.24 mg/dL    Calcium 7.8 (L) 8.9 - 10.3 mg/dL    GFR, Estimated 30 (L) >60 mL/min      Comment: (NOTE) Calculated using the CKD-EPI Creatinine Equation (2021)      Anion gap 10 5 - 15      Comment: Performed at [facility], [address] [place], [place] [address]  Glucose, capillary     Status: Abnormal    Collection Time: 04/07/23  6:00 AM  Result Value Ref Range    Glucose-Capillary 128 (H) 70 - 99 mg/dL      Comment: Glucose reference range applies only to samples taken after fasting for at least 8 hours.    Comment 1 Notify RN      Comment 2 Document in Chart    Glucose, capillary     Status: Abnormal    Collection Time: 04/07/23 11:36 AM  Result Value Ref Range    Glucose-Capillary 243 (H) 70 - 99 mg/dL      Comment: Glucose reference range applies only to samples taken after fasting for at least 8 hours.  Glucose, capillary     Status: Abnormal    Collection Time: 04/07/23  4:53 PM  Result Value Ref Range    Glucose-Capillary 174 (H) 70 - 99 mg/dL      Comment: Glucose reference range applies only to samples taken after fasting for at least 8 hours.  Urinalysis, Routine w reflex microscopic -Urine, Clean Catch     Status: None    Collection Time: 04/07/23  6:10 PM  Result Value Ref Range    Color, Urine YELLOW YELLOW    APPearance CLEAR CLEAR    Specific Gravity, Urine 1.009 1.005 - 1.030    pH 5.0 5.0 - 8.0    Glucose, UA NEGATIVE NEGATIVE mg/dL    Hgb urine dipstick NEGATIVE NEGATIVE    Bilirubin Urine NEGATIVE NEGATIVE    Ketones, ur NEGATIVE NEGATIVE mg/dL    Protein, ur NEGATIVE NEGATIVE mg/dL    Nitrite NEGATIVE NEGATIVE    Leukocytes,Ua NEGATIVE NEGATIVE      Comment: Performed at [facility], [address], [place], [place] [address]  Glucose, capillary     Status: Abnormal    Collection Time: 04/07/23  9:46 PM  Result Value Ref Range    Glucose-Capillary 174 (H) 70 - 99 mg/dL      Comment: Glucose  reference range applies only to samples taken after fasting for at least 8 hours.  CBC     Status: Abnormal    Collection Time: 04/08/23  3:01 AM  Result Value Ref Range    WBC 37.8 (H) 4.0 - 10.5 K/uL    RBC 4.02 (L) 4.22 - 5.81 MIL/uL    Hemoglobin 10.4 (L) 13.0 - 17.0 g/dL    HCT 60.4 (L) 54.0 - 52.0 %    MCV 79.6 (L) 80.0 - 100.0 fL    MCH 25.9 (L) 26.0 - 34.0 pg    MCHC 32.5 30.0 - 36.0 g/dL    RDW 98.1 (H) 19.1 - 15.5 %    Platelets 706 (H) 150 - 400 K/uL    nRBC 0.0 0.0 - 0.2 %      Comment: Performed at [facility], [address], [place], [place] [address]  Basic metabolic panel     Status: Abnormal    Collection Time: 04/08/23  3:01 AM  Result Value Ref Range    Sodium 133 (L) 135 - 145 mmol/L    Potassium 3.5 3.5 - 5.1 mmol/L    Chloride 96 (L) 98 - 111 mmol/L    CO2 26 22 - 32 mmol/L    Glucose, Bld 164 (H) 70 - 99 mg/dL      Comment: Glucose reference range applies only to samples taken after fasting for at least 8 hours.    BUN 47 (H) 8 - 23 mg/dL    Creatinine, Ser 5.62 (H) 0.61 - 1.24 mg/dL    Calcium 7.7 (L) 8.9 - 10.3 mg/dL    GFR, Estimated 32 (L) >60 mL/min      Comment: (NOTE) Calculated using the CKD-EPI Creatinine Equation (2021)      Anion gap 11 5 - 15      Comment: Performed at [facility], [address], [place], [place] [address]  Glucose, capillary     Status: Abnormal    Collection Time: 04/08/23  6:41 AM  Result Value Ref Range    Glucose-Capillary 142 (H) 70 - 99 mg/dL      Comment: Glucose reference range applies only to samples taken after fasting for at least 8 hours.    Comment 1 Notify RN        Imaging Results (Last 48 hours)  No results found.         Blood pressure 119/66, pulse 93, temperature 97.6 F (36.4 C), temperature source Oral, resp. rate 16, height 6\' 2"  (1.88 m), weight 86.8 kg, SpO2 97%.   Medical Problem List and Plan: 1. Functional deficits secondary to retroperitoneal hematoma after recent  fall.             -also right right AC jt injury, ?clavicle fx             -patient may  shower             -ELOS/Goals: 10-12 days, supervision goals with PT, OT   2.  Antithrombotics: -DVT/anticoagulation:  Pharmaceutical: Eliquis             -antiplatelet therapy: none             -pt with  new right lower extremity edema and thigh pain. Dopplers pending                         -eliquis recently resumed, so findings won't change medical mgt 3. Pain Management: Tylenol, oxycodone as needed for hip              -right thigh pain. ?muscle strain. No sensory findings. Most recent CT reviewed.  It's possible he's had further migration of blood further down into the thigh as there was blood along the distal iliopsoas on that CT                         -add prn robaxin                         -order kpad    -dopplers pending as above 4. Mood/Behavior/Sleep: LCSW to evaluate and provide emotional support             -continue melatonin 10 mg              -antipsychotic agents: n/a   5. Neuropsych/cognition: This patient is capable of making decisions on his own behalf.   6. Skin/Wound Care: Routine skin care checks   7. Fluids/Electrolytes/Nutrition: Strict Is and Os and follow-up chemistries   8: Hypertension: monitor TID and prn (Entresto, torsemide held)             -currently on midodrine 10 mg TID for hypotension   9:  Cardiorenal syndrome/acute kidney Injury on chronic kidney disease stage IIIb (baseline creatinine around 2)              -milrinone discontinued 12/08             -no indication for HD             -daily weight             -continue sodium bicarbonate 650 mg BID             -continue torsemide 20 mg daily   10: Fall/right retroperitoneal hematoma: now back on Eliquis; H and H stable             -follow-up CBC   11: Atypical atrial flutter; hx of pAF: rate controlled (low 100s)             -on Eliquis, amiodarone   12: COPD: former tobacco smoker, quit 2018    13: DM-2: CBGs QID; A1c = 6.9% (home on Ozempic; metformin discontinued due to CKD)             -continue SSI   14: CAD: s/p 2-vessel CABG 2018             -restart statin once LFTs normalized             -follow-up CMP   15: Acute on chronic systolic and diastolic heart failure, hx of stage D systolic heart failure post CCM             -continue amiodarone 200 mg daily             -continue torsemide 20 mg daily   16: Elevated transaminases, up trending: ? due to congestive hepatopathy             -follow-up CMP   17: Multiple falls; AC joint injury/possible right distal clavicular fracture 11/05   18:  Possible pneumonia: continue Augmentin to finish the course   19: Leukocytosis, chronic: negative infectious work-up except possible PNA; had short course steroids for skin rash             -appears to be off prednisone now             -on Augmentin (end 12/11)             -follow-up CBC with diff on 12/10   20: Lung nodule: follow-up outpatient with PCP   21: Hx of bladder cancer s/p TURP 2013   22: Skin rash treated with prednisone/hydroxyzine/calamine lotion             -appears prednisone has been discontinued   23: Chronic anemia: H and H stable times 72 hours             -follow-up CBC   24: Thrombocytosis; fluctuating: follow-up CBC       Milinda Antis, PA-C 04/08/2023  I have personally performed a face to face diagnostic evaluation of this patient and formulated the key components of the plan.  Additionally, I have personally reviewed laboratory data, imaging studies, as well as relevant notes and concur with the physician assistant's documentation above.  The patient's status has not changed from the original H&P.  Any changes in documentation from the acute care chart have been noted above.  Ranelle Oyster, MD, [place] [facility]

## 2023-04-08 NOTE — Plan of Care (Signed)
  Problem: Education: Goal: Knowledge of General Education information will improve Description: Including pain rating scale, medication(s)/side effects and non-pharmacologic comfort measures Outcome: Progressing   Problem: Health Behavior/Discharge Planning: Goal: Ability to manage health-related needs will improve Outcome: Progressing   Problem: Clinical Measurements: Goal: Ability to maintain clinical measurements within normal limits will improve Outcome: Progressing Goal: Will remain free from infection Outcome: Progressing Goal: Diagnostic test results will improve Outcome: Progressing Goal: Respiratory complications will improve Outcome: Progressing Goal: Cardiovascular complication will be avoided Outcome: Progressing   Problem: Activity: Goal: Risk for activity intolerance will decrease Outcome: Progressing   Problem: Nutrition: Goal: Adequate nutrition will be maintained Outcome: Progressing   Problem: Coping: Goal: Level of anxiety will decrease Outcome: Progressing   Problem: Elimination: Goal: Will not experience complications related to bowel motility Outcome: Progressing Goal: Will not experience complications related to urinary retention Outcome: Progressing   Problem: Pain Management: Goal: General experience of comfort will improve Outcome: Progressing   Problem: Safety: Goal: Ability to remain free from injury will improve Outcome: Progressing   Problem: Skin Integrity: Goal: Risk for impaired skin integrity will decrease Outcome: Progressing   Problem: Activity: Goal: Ability to tolerate increased activity will improve Outcome: Progressing   Problem: Clinical Measurements: Goal: Ability to maintain a body temperature in the normal range will improve Outcome: Progressing   Problem: Respiratory: Goal: Ability to maintain adequate ventilation will improve Outcome: Progressing Goal: Ability to maintain a clear airway will improve Outcome:  Progressing   Problem: Education: Goal: Ability to describe self-care measures that may prevent or decrease complications (Diabetes Survival Skills Education) will improve Outcome: Progressing Goal: Individualized Educational Video(s) Outcome: Progressing   Problem: Coping: Goal: Ability to adjust to condition or change in health will improve Outcome: Progressing   Problem: Fluid Volume: Goal: Ability to maintain a balanced intake and output will improve Outcome: Progressing   Problem: Health Behavior/Discharge Planning: Goal: Ability to identify and utilize available resources and services will improve Outcome: Progressing Goal: Ability to manage health-related needs will improve Outcome: Progressing   Problem: Metabolic: Goal: Ability to maintain appropriate glucose levels will improve Outcome: Progressing   Problem: Nutritional: Goal: Maintenance of adequate nutrition will improve Outcome: Progressing Goal: Progress toward achieving an optimal weight will improve Outcome: Progressing   Problem: Skin Integrity: Goal: Risk for impaired skin integrity will decrease Outcome: Progressing   Problem: Tissue Perfusion: Goal: Adequacy of tissue perfusion will improve Outcome: Progressing

## 2023-04-09 ENCOUNTER — Inpatient Hospital Stay (HOSPITAL_COMMUNITY): Payer: Medicare Other

## 2023-04-09 DIAGNOSIS — R5381 Other malaise: Secondary | ICD-10-CM | POA: Diagnosis not present

## 2023-04-09 DIAGNOSIS — D72829 Elevated white blood cell count, unspecified: Secondary | ICD-10-CM

## 2023-04-09 LAB — CBC WITH DIFFERENTIAL/PLATELET
Abs Immature Granulocytes: 1.11 10*3/uL — ABNORMAL HIGH (ref 0.00–0.07)
Basophils Absolute: 0.1 10*3/uL (ref 0.0–0.1)
Basophils Relative: 0 %
Eosinophils Absolute: 0 10*3/uL (ref 0.0–0.5)
Eosinophils Relative: 0 %
HCT: 31.9 % — ABNORMAL LOW (ref 39.0–52.0)
Hemoglobin: 10.9 g/dL — ABNORMAL LOW (ref 13.0–17.0)
Immature Granulocytes: 3 %
Lymphocytes Relative: 2 %
Lymphs Abs: 0.8 10*3/uL (ref 0.7–4.0)
MCH: 26.8 pg (ref 26.0–34.0)
MCHC: 34.2 g/dL (ref 30.0–36.0)
MCV: 78.4 fL — ABNORMAL LOW (ref 80.0–100.0)
Monocytes Absolute: 0.8 10*3/uL (ref 0.1–1.0)
Monocytes Relative: 2 %
Neutro Abs: 40.9 10*3/uL — ABNORMAL HIGH (ref 1.7–7.7)
Neutrophils Relative %: 93 %
Platelets: 584 10*3/uL — ABNORMAL HIGH (ref 150–400)
RBC: 4.07 MIL/uL — ABNORMAL LOW (ref 4.22–5.81)
RDW: 18.2 % — ABNORMAL HIGH (ref 11.5–15.5)
Smear Review: INCREASED
WBC: 43.7 10*3/uL — ABNORMAL HIGH (ref 4.0–10.5)
nRBC: 0 % (ref 0.0–0.2)

## 2023-04-09 LAB — COMPREHENSIVE METABOLIC PANEL
ALT: 141 U/L — ABNORMAL HIGH (ref 0–44)
AST: 98 U/L — ABNORMAL HIGH (ref 15–41)
Albumin: 1.8 g/dL — ABNORMAL LOW (ref 3.5–5.0)
Alkaline Phosphatase: 108 U/L (ref 38–126)
Anion gap: 11 (ref 5–15)
BUN: 37 mg/dL — ABNORMAL HIGH (ref 8–23)
CO2: 26 mmol/L (ref 22–32)
Calcium: 7.8 mg/dL — ABNORMAL LOW (ref 8.9–10.3)
Chloride: 94 mmol/L — ABNORMAL LOW (ref 98–111)
Creatinine, Ser: 1.93 mg/dL — ABNORMAL HIGH (ref 0.61–1.24)
GFR, Estimated: 35 mL/min — ABNORMAL LOW (ref >60)
Glucose, Bld: 230 mg/dL — ABNORMAL HIGH (ref 70–99)
Potassium: 4 mmol/L (ref 3.5–5.1)
Sodium: 131 mmol/L — ABNORMAL LOW (ref 135–145)
Total Bilirubin: 1.6 mg/dL — ABNORMAL HIGH (ref <1.2)
Total Protein: 5.2 g/dL — ABNORMAL LOW (ref 6.5–8.1)

## 2023-04-09 LAB — GLUCOSE, CAPILLARY
Glucose-Capillary: 128 mg/dL — ABNORMAL HIGH (ref 70–99)
Glucose-Capillary: 225 mg/dL — ABNORMAL HIGH (ref 70–99)
Glucose-Capillary: 202 mg/dL — ABNORMAL HIGH (ref 70–99)
Glucose-Capillary: 219 mg/dL — ABNORMAL HIGH (ref 70–99)

## 2023-04-09 MED ORDER — OXYCODONE HCL 5 MG PO TABS: 5.0000 mg | ORAL_TABLET | ORAL | Status: DC | PRN

## 2023-04-09 MED ORDER — OXYCODONE HCL 5 MG PO TABS
5.0000 mg | ORAL_TABLET | ORAL | Status: DC | PRN
  Administered 2023-04-09 – 2023-04-11 (×3): 10 mg via ORAL
  Filled 2023-04-09 (×4): qty 2

## 2023-04-09 MED ORDER — GABAPENTIN 100 MG PO CAPS
100.0000 mg | ORAL_CAPSULE | Freq: Three times a day (TID) | ORAL | Status: DC
  Administered 2023-04-09 – 2023-04-11 (×7): 100 mg via ORAL
  Filled 2023-04-09 (×7): qty 1

## 2023-04-09 MED ORDER — ACETAMINOPHEN 500 MG PO TABS: 1000.0000 mg | ORAL_TABLET | Freq: Three times a day (TID) | ORAL | Status: DC

## 2023-04-09 MED ORDER — ACETAMINOPHEN 325 MG PO TABS
650.0000 mg | ORAL_TABLET | Freq: Four times a day (QID) | ORAL | Status: DC | PRN
Start: 2023-04-09 — End: 2023-04-15
  Administered 2023-04-10 – 2023-04-15 (×6): 650 mg via ORAL
  Filled 2023-04-09 (×6): qty 2

## 2023-04-09 NOTE — Plan of Care (Signed)
  Problem: RH Balance Goal: LTG: Patient will maintain dynamic sitting balance (OT) Description: LTG:  Patient will maintain dynamic sitting balance with assistance during activities of daily living (OT) Flowsheets (Taken 04/09/2023 1139) LTG: Pt will maintain dynamic sitting balance during ADLs with: Independent with assistive device Goal: LTG Patient will maintain dynamic standing with ADLs (OT) Description: LTG:  Patient will maintain dynamic standing balance with assist during activities of daily living (OT)  Flowsheets (Taken 04/09/2023 1139) LTG: Pt will maintain dynamic standing balance during ADLs with: Contact Guard/Touching assist   Problem: Sit to Stand Goal: LTG:  Patient will perform sit to stand in prep for activites of daily living with assistance level (OT) Description: LTG:  Patient will perform sit to stand in prep for activites of daily living with assistance level (OT) Flowsheets (Taken 04/09/2023 1139) LTG: PT will perform sit to stand in prep for activites of daily living with assistance level: Contact Guard/Touching assist   Problem: RH Grooming Goal: LTG Patient will perform grooming w/assist,cues/equip (OT) Description: LTG: Patient will perform grooming with assist, with/without cues using equipment (OT) Flowsheets (Taken 04/09/2023 1139) LTG: Pt will perform grooming with assistance level of: Set up assist    Problem: RH Bathing Goal: LTG Patient will bathe all body parts with assist levels (OT) Description: LTG: Patient will bathe all body parts with assist levels (OT) Flowsheets (Taken 04/09/2023 1139) LTG: Pt will perform bathing with assistance level/cueing: Contact Guard/Touching assist   Problem: RH Dressing Goal: LTG Patient will perform upper body dressing (OT) Description: LTG Patient will perform upper body dressing with assist, with/without cues (OT). Flowsheets (Taken 04/09/2023 1139) LTG: Pt will perform upper body dressing with assistance level  of: Set up assist Goal: LTG Patient will perform lower body dressing w/assist (OT) Description: LTG: Patient will perform lower body dressing with assist, with/without cues in positioning using equipment (OT) Flowsheets (Taken 04/09/2023 1139) LTG: Pt will perform lower body dressing with assistance level of: Contact Guard/Touching assist   Problem: RH Toileting Goal: LTG Patient will perform toileting task (3/3 steps) with assistance level (OT) Description: LTG: Patient will perform toileting task (3/3 steps) with assistance level (OT)  Flowsheets (Taken 04/09/2023 1139) LTG: Pt will perform toileting task (3/3 steps) with assistance level: Contact Guard/Touching assist   Problem: RH Toilet Transfers Goal: LTG Patient will perform toilet transfers w/assist (OT) Description: LTG: Patient will perform toilet transfers with assist, with/without cues using equipment (OT) Flowsheets (Taken 04/09/2023 1139) LTG: Pt will perform toilet transfers with assistance level of: Contact Guard/Touching assist   Problem: RH Tub/Shower Transfers Goal: LTG Patient will perform tub/shower transfers w/assist (OT) Description: LTG: Patient will perform tub/shower transfers with assist, with/without cues using equipment (OT) Flowsheets (Taken 04/09/2023 1139) LTG: Pt will perform tub/shower stall transfers with assistance level of: Contact Guard/Touching assist

## 2023-04-09 NOTE — Discharge Instructions (Signed)
Inpatient Rehab Discharge Instructions  [name] [name] Discharge date and time:    Activities/Precautions/ Functional Status: Activity: no lifting, driving, or strenuous exercise until cleared by MD Diet: diabetic diet Wound Care: as directed Functional status:  ___ No restrictions     ___ Walk up steps independently __x_ 24/7 supervision/assistance   ___ Walk up steps with assistance ___ Intermittent supervision/assistance  ___ Bathe/dress independently ___ Walk with walker     ___ Bathe/dress with assistance ___ Walk Independently    ___ Shower independently ___ Walk with assistance    __x_ Shower with assistance __x_ No alcohol     ___ Return to work/school ________  Special Instructions: No driving, alcohol consumption or tobacco use.    My questions have been answered and I understand these instructions. I will adhere to these goals and the provided educational materials after my discharge from the hospital.  Patient/Caregiver Signature _______________________________ Date __________  Clinician Signature _______________________________________ Date __________  Please bring this form and your medication list with you to all your follow-up doctor's appointments.

## 2023-04-09 NOTE — Evaluation (Signed)
Occupational Therapy Assessment and Plan  Patient Details  Name: [name] [name] MRN: [id number] Date of Birth: [birth year]  OT Diagnosis: abnormal posture, acute pain, cognitive deficits, muscle weakness (generalized), pain in joint, and swelling of limb Rehab Potential: Rehab Potential (ACUTE ONLY): Good ELOS: 2 weeks   Today's Date: 04/09/2023 OT Individual Time: [phone], 1345-1414 OT Individual Time Calculation (min):71 min, 29 min     Hospital Problem: Principal Problem:   Debility   Past Medical History:  Past Medical History:  Diagnosis Date   AICD (automatic cardioverter/defibrillator) present    Arthritis    Bladder cancer (HCC)    Borderline glaucoma    Chronic systolic (congestive) heart failure (HCC)    Coronary artery disease    Diabetes mellitus type 2, controlled (HCC) ORAL MED   Essential hypertension    Heart murmur    Hepatitis    Medical history non-contributory    Paroxysmal A-fib (HCC)    Paroxysmal A-fib (HCC) 08/20/2020   Peripheral vascular disease (HCC)    Pneumonia    Past Surgical History:  Past Surgical History:  Procedure Laterality Date   CARDIOVERSION N/A 08/10/2021   Procedure: CARDIOVERSION;  Surgeon: Dolores Patty, MD;  Location: [facility] ENDOSCOPY;  Service: Cardiovascular;  Laterality: N/A;   CARDIOVERSION N/A 11/09/2022   Procedure: CARDIOVERSION;  Surgeon: Maisie Fus, MD;  Location: [facility] INVASIVE CV LAB;  Service: Cardiovascular;  Laterality: N/A;   CARDIOVERSION N/A 03/07/2023   Procedure: CARDIOVERSION (CATH LAB);  Surgeon: Tessa Lerner, DO;  Location: [facility] INVASIVE CV LAB;  Service: Cardiovascular;  Laterality: N/A;   CAROTID DUPLEX SCAN  07-26-2010   BILATERAL ICA  STENOSIS 1% - 39%   CCM GENERATOR INSERTION N/A 01/04/2023   Procedure: CCM GENERATOR INSERTION;  Surgeon: Duke Salvia, MD;  Location: [facility] INVASIVE CV LAB;  Service: Cardiovascular;  Laterality: N/A;   CHOLECYSTECTOMY N/A 08/22/2020   Procedure: LAPAROSCOPIC  CHOLECYSTECTOMY;  Surgeon: Emelia Loron, MD;  Location: [facility] OR;  Service: General;  Laterality: N/A;   CORONARY ARTERY BYPASS GRAFT N/A 04/08/2017   Procedure: CORONARY ARTERY BYPASS GRAFTING (CABG) TIMES TWO USING LEFT INTERNAL MAMMARY ARTERY AND LEFT SAPHENOUS LEG VEIN HARVESTED ENDOSCOPICALLY.  LEG VEIN ALSO HARVESTED FROM THE RIGHT LEG;  Surgeon: Alleen Borne, MD;  Location: [facility] OR;  Service: Open Heart Surgery;  Laterality: N/A;   CYSTOSCOPY WITH BIOPSY N/A 08/01/2012   Procedure: CYSTOSCOPY WITH BIOPSY BLADDER BIOPSY   ;  Surgeon: Antony Haste, MD;  Location: [facility];  Service: Urology;  Laterality: N/A;   ERCP N/A 08/21/2020   Procedure: ENDOSCOPIC RETROGRADE CHOLANGIOPANCREATOGRAPHY (ERCP);  Surgeon: Jeani Hawking, MD;  Location: [facility] ENDOSCOPY;  Service: Endoscopy;  Laterality: N/A;   FULGURATION OF BLADDER TUMOR N/A 08/01/2012   Procedure: FULGURATION OF BLADDER TUMOR;  Surgeon: Antony Haste, MD;  Location: [facility];  Service: Urology;  Laterality: N/A;   ICD IMPLANT  11/20/2017   ICD IMPLANT N/A 11/20/2017   Procedure: ICD IMPLANT;  Surgeon: Duke Salvia, MD;  Location: [facility] INVASIVE CV LAB;  Service: Cardiovascular;  Laterality: N/A;   IR FLUORO GUIDE CV LINE RIGHT  04/01/2023   IR US GUIDE VASC ACCESS RIGHT  04/01/2023   LEFT HEART CATH AND CORONARY ANGIOGRAPHY N/A 03/31/2017   Procedure: LEFT HEART CATH AND CORONARY ANGIOGRAPHY;  Surgeon: Marykay Lex, MD;  Location: [facility] INVASIVE CV LAB;  Service: Cardiovascular;  Laterality: N/A;   LUMBAR LAMINECTOMY/DECOMPRESSION MICRODISCECTOMY Bilateral 06/16/2019   Procedure:  Bilateral Lumbar Four-Five Laminectomy and Foraminotomy;  Surgeon: Julio Sicks, MD;  Location: [facility] OR;  Service: Neurosurgery;  Laterality: Bilateral;  posterior   REMOVAL OF STONES  08/21/2020   Procedure: REMOVAL OF STONES;  Surgeon: Jeani Hawking, MD;  Location: [facility] ENDOSCOPY;  Service: Endoscopy;;   RIGHT HEART  CATH Right 03/31/2017   Procedure: RIGHT HEART CATH;  Surgeon: Marykay Lex, MD;  Location: [facility] INVASIVE CV LAB;  Service: Cardiovascular;  Laterality: Right;   SPHINCTEROTOMY  08/21/2020   Procedure: SPHINCTEROTOMY;  Surgeon: Jeani Hawking, MD;  Location: [facility] ENDOSCOPY;  Service: Endoscopy;;   TEE WITHOUT CARDIOVERSION N/A 04/08/2017   Procedure: TRANSESOPHAGEAL ECHOCARDIOGRAM (TEE);  Surgeon: Alleen Borne, MD;  Location: [facility] OR;  Service: Open Heart Surgery;  Laterality: N/A;   TRANSESOPHAGEAL ECHOCARDIOGRAM (CATH LAB) N/A 03/07/2023   Procedure: TRANSESOPHAGEAL ECHOCARDIOGRAM;  Surgeon: Tessa Lerner, DO;  Location: [facility] INVASIVE CV LAB;  Service: Cardiovascular;  Laterality: N/A;   TRANSURETHRAL RESECTION OF BLADDER TUMOR  05/29/2011   Procedure: TRANSURETHRAL RESECTION OF BLADDER TUMOR (TURBT);  Surgeon: Antony Haste, MD;  Location: [facility];  Service: Urology;  Laterality: N/A;   VENTRICULAR ASSIST DEVICE INSERTION Right 03/31/2017   Procedure: VENTRICULAR ASSIST DEVICE INSERTION;  Surgeon: Marykay Lex, MD;  Location: [facility] INVASIVE CV LAB;  Service: Cardiovascular;  Laterality: Right;    Assessment & Plan Clinical Impression: [name] [name] is a 77 year old male with a history of CHF and CKD who presented to the ED on 03/25/2023 complaining of ongoing right hip pain. He had fallen after syncopal episode on 11/04. Evaluated then in the ED. He had hit his head and complained of right shoulder and hip pain.  CT head, CTA head and CT of c-spine right ip x-rays were normal. Mild acromioclavicular widening with possible small fracture fragment arising from the distal clavicle. Findings suggestive of AC joint injury. He is maintained on Eliquis for pAF. Underwent TEE. Leukocytosis with negative cultures. Discharged home on 11/08.   He returned the hospital on 11/25 with increasing right hip pain. CT scan of right hip this admission showed retroperitoneal hematoma extending  to right psoas and iliacus muscles. No surgical intervention was recommended. H and H stable. Eliquis resumed.  Noted leukocytosis and cultures obtained.  CT of the chest showed multi level groundglass infiltrates bilaterally.  Intravenous antibiotics initiated and is now on Augmentin to finish course.  He developed a pruritic skin rash and has been treated with Solu-Medrol, hydroxyzine and calamine lotion.  Was given prednisone taper.  Leukocytosis most likely due to steroids.  He has remained afebrile. Cardiology consulted for volume overload and nephrology consulted for worsening renal function.  Due to severe systolic CHF and volume overload, started on milrinone on 12/02.  He has a past history of coronary artery disease status post two-vessel CABG in 2018.  He is status post CCM insertion by Dr. Graciela Husbands in September, 2024.  Past medical history significant for chronic kidney disease and diabetes mellitus.  His single dose of metformin at home was discontinued prior to admission.  A1c is 6.9%.  He required midodrine for hypotension.  Milrinone discontinued yesterday and torsemide resumed and Entresto remains held.  Additional lung nodule noted on CT in the right middle lobe.  Repeat CT in 3 months versus PET-CT.  Pt developed swelling in right lower leg with associated thigh pain over the last 24 hours or so. LE venous dopplers were ordered today prior to admit. Results are pending.  The patient lives at home with his wife and prior to his initial fall was ambulating with a rolling walker.  Right internal jugular tunneled PICC in place. The patient requires inpatient physical medicine and rehabilitation evaluations and services for ongoing dysfunction secondary to debility.  Patient currently requires max with basic self-care skills and IADL secondary to muscle weakness and muscle joint tightness, decreased cardiorespiratoy endurance and decreased oxygen support, impaired timing and sequencing,  unbalanced muscle activation, decreased coordination, and decreased motor planning, decreased safety awareness, decreased memory, and delayed processing, and decreased sitting balance, decreased standing balance, decreased postural control, and decreased balance strategies.  Prior to hospitalization, patient could complete  all ADL's incl driving prior to fall with independent  level.   Patient will benefit from skilled intervention to decrease level of assist with basic self-care skills, increase independence with basic self-care skills, and increase level of independence with iADL prior to discharge home with care partner.  Anticipate patient will require 24 hour supervision and minimal physical assistance and follow up home health.  OT - End of Session Activity Tolerance: Decreased this session Endurance Deficit: Yes Endurance Deficit Description: rest breaks with all mobility OT Assessment Rehab Potential (ACUTE ONLY): Good OT Barriers to Discharge: Inaccessible home environment;Home environment access/layout OT Barriers to Discharge Comments: pain, frequent falls, FF to 2nd floor OT Patient demonstrates impairments in the following area(s): Balance;Edema;Nutrition;Safety;Endurance;Pain;Sensory;Cognition;Motor;Skin Integrity OT Basic ADL's Functional Problem(s): Grooming;Bathing;Dressing;Toileting OT Advanced ADL's Functional Problem(s): Laundry;Simple Meal Preparation;Light Housekeeping OT Transfers Functional Problem(s): Toilet;Tub/Shower OT Plan OT Intensity: Minimum of 1-2 x/day, 45 to 90 minutes OT Frequency: 5 out of 7 days OT Duration/Estimated Length of Stay: 2 weeks OT Treatment/Interventions: Balance/vestibular training;Cognitive remediation/compensation;Community reintegration;Discharge planning;Disease mangement/prevention;DME/adaptive equipment instruction;Functional electrical stimulation;Functional mobility training;Neuromuscular re-education;Pain management;Patient/family  education;Psychosocial support;Self Care/advanced ADL retraining;Skin care/wound managment;Splinting/orthotics;Therapeutic Activities;Therapeutic Exercise;UE/LE Strength taining/ROM;UE/LE Coordination activities;Visual/perceptual remediation/compensation;Wheelchair propulsion/positioning OT Self Feeding Anticipated Outcome(s): indep OT Basic Self-Care Anticipated Outcome(s): CGA LB, set up UB OT Toileting Anticipated Outcome(s): CGA OT Bathroom Transfers Anticipated Outcome(s): CGA OT Recommendation Recommendations for Other Services: Speech consult;Neuropsych consult;Therapeutic Recreation consult Therapeutic Recreation Interventions: Stress management Patient destination: Home Follow Up Recommendations: Home health OT Equipment Recommended: 3 in 1 bedside comode;Tub/shower bench Equipment Details: 3 in 1 commode, TTB if shower stool is not suffcient pt already has   OT Evaluation Precautions/Restrictions  Precautions Precautions: Fall Precaution Comments: watch BP and HR Restrictions Weight Bearing Restrictions: No RUE Weight Bearing: Weight bearing as tolerated General Chart Reviewed: Yes Family/Caregiver Present: No Pain Pain Assessment Pain Scale: 0-10 Pain Score: 4  Pain Type: Acute pain Pain Location: Leg Pain Orientation: Right Pain Descriptors / Indicators: Aching;Throbbing;Tender Pain Frequency: Constant Pain Onset: On-going Patients Stated Pain Goal: 2 Pain Intervention(s): Pain med given for lower pain score than stated, per patient request;Emotional support;Distraction;Repositioned;Relaxation;Hot/Cold interventions Multiple Pain Sites: No Home Living/Prior Functioning Home Living Family/patient expects to be discharged to:: Private residence Living Arrangements: Spouse/significant other Available Help at Discharge: Family, Available 24 hours/day Type of Home: House Home Access: Stairs to enter [employer] of Steps: 2 Entrance Stairs-Rails:  None Home Layout: Multi-level, Bed/bath upstairs (half bath downstairs) Alternate Level Stairs-Number of Steps: flight, stair lift to be put in Alternate Level Stairs-Rails: Left Bathroom Shower/Tub: Health visitor: Standard Bathroom Accessibility: Yes Additional Comments: stair lift being installed  Lives With: Spouse IADL History Homemaking Responsibilities: Yes Current License: Yes Mode of Transportation: Car Education: Automotive engineer Occupation: Retired Type of Occupation: [employer] Leisure and Hobbies: grandchildren, dog Prior Function Level of  Independence: Independent with basic ADLs, Independent with gait, Independent with transfers  Able to Take Stairs?: Yes Driving: Yes Vocation: Retired Marine scientist Requirements: [employer] Vision Baseline Vision/History: 1 Wears glasses Ability to See in Adequate Light: 0 Adequate Patient Visual Report: No change from baseline Perception  Perception: Within Functional Limits Praxis Praxis: WFL Cognition Cognition Overall Cognitive Status: Impaired/Different from baseline Arousal/Alertness: Awake/alert Orientation Level: Person;Place;Situation Person: Oriented Place: Oriented Situation: Oriented Memory: Impaired Memory Impairment: Decreased recall of new information;Decreased short term memory Decreased Short Term Memory: Verbal complex;Functional complex Awareness: Appears intact Problem Solving: Impaired Problem Solving Impairment: Verbal complex;Functional complex Executive Function: Sequencing Safety/Judgment: Appears intact Comments: slower processing and disorganized with fear of pain Brief Interview for Mental Status (BIMS) Repetition of Three Words (First Attempt): 3 Temporal Orientation: Year: Correct Temporal Orientation: Month: Accurate within 5 days Temporal Orientation: Day: Incorrect Recall: "Sock": Yes, no cue required Recall: "Blue": Yes, no cue required Recall: "Bed": Yes, after cueing ("a piece of  furniture") BIMS Summary Score: 13 Sensation Sensation Light Touch: Impaired by gross assessment Hot/Cold: Appears Intact Proprioception: Appears Intact Stereognosis: Appears Intact Additional Comments: reports hypersensitivity in lateral R thigh Coordination Gross Motor Movements are Fluid and Coordinated: No Fine Motor Movements are Fluid and Coordinated: No Coordination and Movement Description: decreased smoothness and accuracy Finger Nose Finger Test: some dysmetria noted 9 Hole Peg Test: TBA later visit Motor  Motor Motor: Other (comment) Motor - Skilled Clinical Observations: generalized weakness, poor tolerance to weightbearing and all ROM with RLE  Trunk/Postural Assessment  Cervical Assessment Cervical Assessment: Within Functional Limits Thoracic Assessment Thoracic Assessment: Within Functional Limits Lumbar Assessment Lumbar Assessment: Exceptions to [facility] (posterior pelvic tilt) Postural Control Postural Control: Deficits on evaluation Righting Reactions: delayed and inadequate Protective Responses: decreased Postural Limitations: decreased  Balance Balance Balance Assessed: Yes Static Sitting Balance Static Sitting - Balance Support: Bilateral upper extremity supported;Feet supported Static Sitting - Level of Assistance: 5: Stand by assistance Static Standing Balance Static Standing - Balance Support: Bilateral upper extremity supported Static Standing - Level of Assistance: 3: Mod assist Dynamic Standing Balance Dynamic Standing - Balance Support: Bilateral upper extremity supported;During functional activity Dynamic Standing - Level of Assistance: 3: Mod assist Extremity/Trunk Assessment RUE Assessment RUE Assessment: Exceptions to [facility] General Strength Comments: 4-/5 R sh and grip TBA LUE Assessment LUE Assessment: Within Functional Limits General Strength Comments: TBA later session  Care Tool Care Tool Self Care Eating   Eating Assist Level: Set  up assist    Oral Care    Oral Care Assist Level: Contact Guard/Toucning assist    Bathing   Body parts bathed by patient: Right arm;Left arm;Chest;Face;Abdomen Body parts bathed by helper: Front perineal area;Right lower leg;Left upper leg;Right upper leg;Buttocks;Left lower leg   Assist Level: Total Assistance - Patient < 25%    Upper Body Dressing(including orthotics)   What is the patient wearing?: Pull over shirt   Assist Level: Contact Guard/Touching assist    Lower Body Dressing (excluding footwear)   What is the patient wearing?: Pants Assist for lower body dressing: Total Assistance - Patient < 25%    Putting on/Taking off footwear   What is the patient wearing?: Non-skid slipper socks Assist for footwear: Total Assistance - Patient < 25%       Care Tool Toileting Toileting activity   Assist for toileting: Maximal Assistance - Patient 25 - 49%     Care Tool Bed Mobility Roll left and right activity   Roll left and right  assist level: Minimal Assistance - Patient > 75%    Sit to lying activity   Sit to lying assist level: 2 Helpers    Lying to sitting on side of bed activity   Lying to sitting on side of bed assist level: the ability to move from lying on the back to sitting on the side of the bed with no back support.: Maximal Assistance - Patient 25 - 49%     Care Tool Transfers Sit to stand transfer   Sit to stand assist level: Maximal Assistance - Patient 25 - 49%    Chair/bed transfer   Chair/bed transfer assist level: 2 Helpers     Toilet transfer   Assist Level: Maximal Assistance - Patient 24 - 49%     Care Tool Cognition  Expression of Ideas and Wants Expression of Ideas and Wants: 4. Without difficulty (complex and basic) - expresses complex messages without difficulty and with speech that is clear and easy to understand  Understanding Verbal and Non-Verbal Content Understanding Verbal and Non-Verbal Content: 4. Understands (complex and basic)  - clear comprehension without cues or repetitions   Memory/Recall Ability Memory/Recall Ability : Current season;That he or she is in a hospital/hospital unit   Refer to Care Plan for Long Term Goals  SHORT TERM GOAL WEEK 1 OT Short Term Goal 1 (Week 1): LB dressing with AE and min A OT Short Term Goal 2 (Week 1): Standing 2 min sink side with LRAD for grooming with min A OT Short Term Goal 3 (Week 1): Toilet/stall shower transfer with LRAD and min A OT Short Term Goal 4 (Week 1): UB self care seated sinkside with set up  Recommendations for other services: ST, NP, TR for stress mngt   Skilled Therapeutic Intervention ADL ADL Eating: Set up Where Assessed-Eating: Bed level Grooming: Minimal assistance Where Assessed-Grooming: Sitting at sink;Wheelchair Upper Body Bathing: Minimal assistance Where Assessed-Upper Body Bathing: Sitting at sink;Wheelchair Lower Body Bathing: Dependent Where Assessed-Lower Body Bathing: Sitting at sink;Standing at sink;Wheelchair Upper Body Dressing: Minimal assistance;Contact guard Where Assessed-Upper Body Dressing: Sitting at sink;Wheelchair Lower Body Dressing: Dependent Where Assessed-Lower Body Dressing: Standing at sink;Sitting at sink;Wheelchair Toileting: Maximal assistance Where Assessed-Toileting: Bedside Commode;Bed level Toilet Transfer: Maximal assistance Toilet Transfer Method: Surveyor, minerals: Gaffer: Unable to assess Film/video editor Method: Unable to assess ADL Comments: total a for LB bathing and dressing, min A/CGA for UB sink side self care, max A SPT bed to w/c and BSC, significantly limited by R LE pain, pt reports decreased STM and proessing Mobility  Bed Mobility Bed Mobility: Sit to Supine Sit to Supine: 2 Helpers Transfers Sit to Stand: Moderate Assistance - Patient 50-74% Stand to Sit: Moderate Assistance - Patient 50-74% OT Treatment/Intervention:   1st  Session: Pt seen for full initial OT evaluation and training session this am. Pt in bed upon OT arrival. OT introduced role of therapy and purpose of session. Pt open to all presented assessment and training this visit despite R LE pain. Pt on 2 ltrs O2 currently via [place]. Wife left bedside earlier to obtain clothes for pt. OT gathered pull on scrubs for am self care routine as well as ice for r hip. OT assisted and assessed ADL's, mobility, vision, sensation. cognition/lang, G/FMC, strength and balance throughout session. See above for levels. Pt with some delayed processing and overall memory reported and demonstrated thus will discuss with team ST needs if appropriate to assess. OT requested  NT assist for safety with all transfer and mobility due to pt's pain. Educated in positioning, pain management and safety. Pt will benefit from skilled OT services at [facility] to maximize function and safety with recommendation to return home with family support and assistance and HHOT services upon d/c home. Pt left at end of session in w/c with chair alarm set, tray table and nurse call bell within reach.   2nd Session:   Pt seen for 2nd session for skilled OT added due to pt need. Wife bedside for support. Pt reports increased comfort now back to bed but does fear mobilization for next PT session. OT educated on potential for kpad and will request from MD as pt feels ice packs are cumbersome and never cold enough. OT completed grip strength testing with L hand 80 lbs R hand 70 lbs which is dominant but sustained clavicular fx early Nov with this fall, 9HPT 29 sec R and 31 sec L with improved coordination this session vs this am. OT feels potential impact from fear of pain adds to difficulty with overall motor control and planning as well as higher level cognitive processing. This session without the anticipation of OOB, pt was able to attend, respond and recall info from am session and general retrieval of recalled info. Left  pt bed level with all needs and safety measures in place.   Pain: 4/10 with R LE  elevated and pain meds administered   Discharge Criteria: Patient will be discharged from OT if patient refuses treatment 3 consecutive times without medical reason, if treatment goals not met, if there is a change in medical status, if patient makes no progress towards goals or if patient is discharged from hospital.  The above assessment, treatment plan, treatment alternatives and goals were discussed and mutually agreed upon: by patient  Vicenta Dunning 04/09/2023, 2:25 PM

## 2023-04-09 NOTE — Discharge Summary (Signed)
Physician Discharge Summary  Patient ID: [name] [name] MRN: [id number] DOB/AGE: 77-18-1947 77 y.o.  Admit date: 04/08/2023 Discharge date: 04/20/2023   Discharge Diagnoses:  Principal Problem:   Debility Active Problems:   Leukocytosis Active problems: Debility secondary to heart failure Right thigh pain  RLE edema Retroperitoneal hematoma Hypertension AKI atop CKD stage IIIb Cardiorenal syndrome Acute on chronic systolic and diastolic HF Elevated transaminases Right AC injury Possible pneumonia Leukocytosis Chronic anemia Thrombocytosis CAD Vertigo/dizziness Lacticaseibacillus isolated from right hip  Discharged Condition: fair  Significant Diagnostic Studies: Narrative & Impression  CLINICAL DATA:  Leukocytosis.   EXAM: PORTABLE CHEST 1 VIEW   COMPARISON:  Chest radiograph dated 04/02/2023.   FINDINGS: No focal consolidation, pleural effusion, pneumothorax. Mild cardiomegaly. Median sternotomy wires. Cardiac pacemakers noted. Atherosclerotic calcification of the aorta. No acute osseous pathology.   IMPRESSION: 1. No active disease. 2. Mild cardiomegaly.     Electronically Signed   By: Elgie Collard M.D.   On: 04/09/2023 17:41    VAS [place] LOWER EXTREMITY VENOUS (DVT)  Result Date: 04/09/2023  Lower Venous DVT Study Patient Name:  [name] [name]  Date of Exam:   04/08/2023 Medical Rec #: [id number]         Accession #:    [id number] Date of Birth: Oct 22, 1945         Patient Gender: M Patient Age:   13 years Exam Location:  [facility] Procedure:      VAS [place] LOWER EXTREMITY VENOUS (DVT) Referring Phys: PARDEEP KHATRI --------------------------------------------------------------------------------  Indications: Swelling, and Edema.  Comparison Study: Previous study on 11.26.2024. Performing Technologist: Fernande Bras  Examination Guidelines: A complete evaluation includes B-mode imaging, spectral Doppler, color Doppler, and power Doppler as  needed of all accessible portions of each vessel. Bilateral testing is considered an integral part of a complete examination. Limited examinations for reoccurring indications may be performed as noted. The reflux portion of the exam is performed with the patient in reverse Trendelenburg.  +---------+---------------+---------+-----------+----------+--------------+ RIGHT    CompressibilityPhasicitySpontaneityPropertiesThrombus Aging +---------+---------------+---------+-----------+----------+--------------+ CFV      Full           Yes      Yes                                 +---------+---------------+---------+-----------+----------+--------------+ SFJ      Full                                                        +---------+---------------+---------+-----------+----------+--------------+ FV Prox  Full                                                        +---------+---------------+---------+-----------+----------+--------------+ FV Mid   Full                                                        +---------+---------------+---------+-----------+----------+--------------+ FV DistalFull                                                        +---------+---------------+---------+-----------+----------+--------------+  PFV      Full                                                        +---------+---------------+---------+-----------+----------+--------------+ POP      Full           Yes      Yes                                 +---------+---------------+---------+-----------+----------+--------------+ PTV      Full                                                        +---------+---------------+---------+-----------+----------+--------------+ PERO     Full                                                        +---------+---------------+---------+-----------+----------+--------------+    +----+---------------+---------+-----------+----------+--------------+ LEFTCompressibilityPhasicitySpontaneityPropertiesThrombus Aging +----+---------------+---------+-----------+----------+--------------+ CFV Full           Yes      Yes                                 +----+---------------+---------+-----------+----------+--------------+     *See table(s) above for measurements and observations. Electronically signed by Carolynn Sayers on 04/09/2023 at 7:24:06 AM.    Final     Labs:  Basic Metabolic Panel: Recent Labs  Lab 04/14/23 0648 04/15/23 0535 04/18/23 0554 04/19/23 0512 04/20/23 0847  NA 133* 132* 130* 131* 129*  K 3.9 4.1 3.6 4.0 4.3  CL 96* 95* 93* 94* 90*  CO2 28 26 27 28 26   GLUCOSE 160* 142* 122* 111* 166*  BUN 33* 33* 37* 36* 42*  CREATININE 2.25* 2.00* 2.52* 2.30* 2.46*  CALCIUM 7.8* 7.9* 7.7* 8.0* 8.2*  MG  --   --   --  1.9  --   PHOS  --   --   --  5.0*  --     CBC: Recent Labs  Lab 04/18/23 0554 04/19/23 1037 04/20/23 0847  WBC 13.1* 13.8* 16.0*  NEUTROABS 11.3* 12.0*  --   HGB 10.0* 10.3* 10.5*  HCT 30.1* 31.4* 32.3*  MCV 80.1 80.7 81.0  PLT 662* 645* 662*    CBG: Recent Labs  Lab 04/19/23 1123 04/19/23 1619 04/19/23 2021 04/20/23 0554 04/20/23 1201  GLUCAP 149* 151* 169* 139* 180*    Brief HPI:   [name] [name] is a 77 y.o. male with a history of CHF and CKD who presented to the ED on 03/25/2023 complaining of ongoing right hip pain. He had fallen after syncopal episode on 11/04. Evaluated then in the ED. He had hit his head and complained of right shoulder and hip pain.  CT head, CTA head and CT of c-spine right ip x-rays were normal. Mild acromioclavicular widening with possible small fracture fragment arising from the distal clavicle. Findings suggestive of  AC joint injury. He is maintained on Eliquis for pAF. Underwent TEE. Leukocytosis with negative cultures. Discharged home on 11/08.   He returned the hospital on 11/25 with  increasing right hip pain. CT scan of right hip this admission showed retroperitoneal hematoma extending to right psoas and iliacus muscles. No surgical intervention was recommended. H and H stable. Eliquis resumed.  Noted leukocytosis and cultures obtained.  CT of the chest showed multi level groundglass infiltrates bilaterally.  Intravenous antibiotics initiated and is now on Augmentin to finish course.  He developed a pruritic skin rash and has been treated with Solu-Medrol, hydroxyzine and calamine lotion.  Was given prednisone taper.  Leukocytosis most likely due to steroids.  He has remained afebrile. Cardiology consulted for volume overload and nephrology consulted for worsening renal function.  Due to severe systolic CHF and volume overload, started on milrinone on 12/02.  He has a past history of coronary artery disease status post two-vessel CABG in 2018.  He is status post CCM insertion by Dr. Graciela Husbands in September, 2024.  Past medical history significant for chronic kidney disease and diabetes mellitus.  His single dose of metformin at home was discontinued prior to admission.  A1c is 6.9%.  He required midodrine for hypotension.  Milrinone discontinued yesterday and torsemide resumed and Entresto remains held.  Additional lung nodule noted on CT in the right middle lobe.  Repeat CT in 3 months versus PET-CT.  Pt developed swelling in right lower leg with associated thigh pain over the last 24 hours or so. LE venous dopplers were ordered today prior to admit. Results are pending.    Hospital Course: MARTHA GUERRY was admitted to rehab 04/08/2023 for inpatient therapies to consist of PT, ST and OT at least three hours five days a week. Past admission physiatrist, therapy team and rehab RN have worked together to provide customized collaborative inpatient rehab. RLE venous duplex negative for DVT. Continued with complaints of right lateral anterior thigh pain and burning. Gabapentin 100 mg TID started.  Increased oxycodone from 5 to 10 mg as needed. On review of labs 12/10, hyponatremia mildly worsened today. LFTs remain elevated but are coming down. BUN/creatinine stable. Blood glucose elevated to 230. WBCs increased to 43.7. Consulted ID to investigate further.  CXR updated>>no active disease. Augmentin discontinued. Consider drainage of psoas fluid collection and daily CBC. Unstageable right heel ulcer noted and low-air-loss mattress provided. H and H stable. Underwent aspiration of right hip fluid collection by IR on 12/12 and started on Vancomycin. Stain with many WBCs and PMNs. 2/12: Episode of dizziness a.m., had taken a.m. oxycodone several hours prior.  Decrease as needed to 5 mg every 6 hours as needed.  DC gabapentin.12/13: Dizziness not recurrent.  Start Robaxin 750 mg 3 times daily for ongoing thigh pain. Sodium bicarb 650 mg continued. Provided Ambien 5 mg prn sleep 12/15. Gram positive rod in hip aspiration fluid> Follow-up labs 12/16: sodium 132, BUN/Cr 33/2.00, AST/ALT falling, WBC count fallling now 19.5. Platelet count up to 640k. ID follow-up on 12/16>>discontinue Vancomycin due to Lactobacillus isolated (no action against). CBC every 72 hours with CRP. Developed intermittent SOB and tachycardia in the 110s-120s during initial therapy sessions on 12/20. EKG, CXR obtained and received 20 mg Lasix IV. Atrial flutter noted. Rapid response called 12/21 for worsening symptoms. Cardiology consulted and patient transferred due to atrial fib with RVR,decompensated HF.  Blood pressures were monitored on TID basis and midodrine continued for hypotension. Torsemide 20 mg restarted. Sherryll Burger  held.  Diabetes has been monitored with ac/hs CBG checks and SSI was use prn for tighter BS control.    Rehab course: During patient's stay in rehab weekly team conferences were held to monitor patient's progress, set goals and discuss barriers to discharge. At admission, patient required max with mobility and   with basic self-care skills and IADL   Discharge disposition: 02-Transferred to [facility]     Diet: carb modified  Special Instructions: No driving, alcohol consumption or tobacco use.   Allergies as of 04/20/2023       Reactions   Spironolactone Other (See Comments)   Painful gynecomastia   Semaglutide    Nausea and bloating   Tramadol Other (See Comments)   Sleep disturbance.  Apnea spells.   Empagliflozin Rash   Rash at the groin Jardiance   Niacin And Related Hives        Medication List     ASK your doctor about these medications    acetaminophen 500 MG tablet Commonly known as: TYLENOL Take 1,000 mg by mouth every 6 (six) hours as needed for mild pain (pain score 1-3), headache or fever.   amiodarone 200 MG tablet Commonly known as: PACERONE Take 1 tablet (200 mg total) by mouth daily.   amoxicillin-clavulanate 500-125 MG tablet Commonly known as: AUGMENTIN Take 1 tablet by mouth 2 (two) times daily for 2 days. Ask about: Should I take this medication?   atorvastatin 80 MG tablet Commonly known as: LIPITOR TAKE 1 TABLET BY MOUTH EVERY DAY   benzonatate 200 MG capsule Commonly known as: TESSALON Take 1 capsule (200 mg total) by mouth 3 (three) times daily as needed for cough.   cyanocobalamin 250 MCG tablet Commonly known as: VITAMIN B12 Take 500 mcg by mouth daily.   Dristan 0.05 % nasal spray Generic drug: oxymetazoline Place 1 spray into both nostrils at bedtime.   Eliquis 5 MG Tabs tablet Generic drug: apixaban TAKE 1 TABLET TWICE A DAY   Entresto 49-51 MG Generic drug: sacubitril-valsartan Take 1 tablet by mouth 2 (two) times daily.   hydrOXYzine 25 MG tablet Commonly known as: ATARAX Take 1 tablet (25 mg total) by mouth 3 (three) times daily as needed for itching.   midodrine 10 MG tablet Commonly known as: PROAMATINE Take 1 tablet (10 mg total) by mouth 3 (three) times daily with meals.   oxyCODONE 5 MG immediate  release tablet Commonly known as: Oxy IR/ROXICODONE Take 1 tablet (5 mg total) by mouth every 4 (four) hours as needed for moderate pain (pain score 4-6).   sodium bicarbonate 650 MG tablet Take 1 tablet (650 mg total) by mouth 2 (two) times daily for 10 days. Ask about: Should I take this medication?   torsemide 20 MG tablet Commonly known as: DEMADEX Take 1 tablet (20 mg total) by mouth daily.   zolpidem 10 MG tablet Commonly known as: AMBIEN Take 5 mg by mouth at bedtime.        Follow-up Information     Angelina Sheriff, DO Follow up.   Specialty: Physical Medicine and Rehabilitation Why: As needed Contact information: [address] [place] [address] [phone]         [name], MD Follow up.   Specialty: Family Medicine Why: Call the office in 1 to 2 days to make arrangements for hospital follow-up appointment Contact information: [address] [place] [address] [phone]  Bensimhon, Bevelyn Buckles, MD. Go to.   Specialty: Cardiology Contact information: [address] [place] [address] [phone]                 Signed: Milinda Antis 04/20/2023, 12:12 PM

## 2023-04-09 NOTE — Plan of Care (Signed)
  Problem: RH Balance Goal: LTG Patient will maintain dynamic standing balance (PT) Description: LTG:  Patient will maintain dynamic standing balance with assistance during mobility activities (PT) Flowsheets (Taken 04/09/2023 1238) LTG: Pt will maintain dynamic standing balance during mobility activities with:: Supervision/Verbal cueing   Problem: Sit to Stand Goal: LTG:  Patient will perform sit to stand with assistance level (PT) Description: LTG:  Patient will perform sit to stand with assistance level (PT) Flowsheets (Taken 04/09/2023 1238) LTG: PT will perform sit to stand in preparation for functional mobility with assistance level: Supervision/Verbal cueing   Problem: RH Bed Mobility Goal: LTG Patient will perform bed mobility with assist (PT) Description: LTG: Patient will perform bed mobility with assistance, with/without cues (PT). Flowsheets (Taken 04/09/2023 1238) LTG: Pt will perform bed mobility with assistance level of: Supervision/Verbal cueing   Problem: RH Bed to Chair Transfers Goal: LTG Patient will perform bed/chair transfers w/assist (PT) Description: LTG: Patient will perform bed to chair transfers with assistance (PT). Flowsheets (Taken 04/09/2023 1238) LTG: Pt will perform Bed to Chair Transfers with assistance level: Supervision/Verbal cueing   Problem: RH Car Transfers Goal: LTG Patient will perform car transfers with assist (PT) Description: LTG: Patient will perform car transfers with assistance (PT). Flowsheets (Taken 04/09/2023 1238) LTG: Pt will perform car transfers with assist:: Minimal Assistance - Patient > 75%   Problem: RH Ambulation Goal: LTG Patient will ambulate in controlled environment (PT) Description: LTG: Patient will ambulate in a controlled environment, # of feet with assistance (PT). Flowsheets (Taken 04/09/2023 1238) LTG: Pt will ambulate in controlled environ  assist needed:: Contact Guard/Touching assist LTG: Ambulation distance  in controlled environment: 150' Goal: LTG Patient will ambulate in home environment (PT) Description: LTG: Patient will ambulate in home environment, # of feet with assistance (PT). Flowsheets (Taken 04/09/2023 1238) LTG: Pt will ambulate in home environ  assist needed:: Supervision/Verbal cueing LTG: Ambulation distance in home environment: 50'   Problem: RH Stairs Goal: LTG Patient will ambulate up and down stairs w/assist (PT) Description: LTG: Patient will ambulate up and down # of stairs with assistance (PT) Flowsheets (Taken 04/09/2023 1238) LTG: Pt will ambulate up/down stairs assist needed:: Minimal Assistance - Patient > 75% LTG: Pt will  ambulate up and down number of stairs: 2 per home set up

## 2023-04-09 NOTE — Progress Notes (Signed)
Inpatient Rehabilitation  Patient information reviewed and entered into eRehab system by Demarrio Menges Gentry, OTR/L, Rehab Quality Coordinator.   Information including medical coding, functional ability and quality indicators will be reviewed and updated through discharge.   

## 2023-04-09 NOTE — Progress Notes (Addendum)
Patient ID: [name] [name], male   DOB: June 01, 1945, 77 y.o.   MRN: [id number]  [id number] spoke with pt wife to introduce self, explain role, discuss discharge process, and d/c plan. Reports plan remains for him to discharge to home, and will have support if needed from her family, and their dtr and SIL live close and are retired. Wife reports they are established with [facility] for PT. Wife would like to remain with this [facility].   SW waiting on follow-up from [name] [name]/[facility] to confirm.  *confirms pt is active with services.   *SW went by pt room, and pt not present as in therapy. SW will follow-up to complete assessment. SW informed pt wife on d/c date being 12/23. Reports stair lift should be installed before he arrives home.   [name] [name], MSW, LCSW Office: [phone] Cell: [phone] Fax: [phone]

## 2023-04-09 NOTE — Progress Notes (Addendum)
Inpatient Rehabilitation Admission Medication Review by a Pharmacist  A complete drug regimen review was completed for this patient to identify any potential clinically significant medication issues.  High Risk Drug Classes Is patient taking? Indication by Medication  Antipsychotic No   Anticoagulant Yes Apixaban - atrial fibrillation (resumed 04/03/23)  Antibiotic Yes Augmentin thru 12/10 pm - completing 7 days antibiotics for pneumonia  Opioid Yes PRN Oxycodone - moderate or severe pain  Antiplatelet No   Hypoglycemics/insulin Yes SSI - DM Type 2  Vasoactive Medication Yes Amiodarone - atrial fibrillation Midodrine - hypotension Torsemide - CHF/volume status  Chemotherapy No   Other Yes Sodium bicarbonate - metabolic acidosis Senna-docusate - laxative Gabapentin - neuropathic pain  PRNs: Benzonatate - cough Calamine lotion, hydrocortisone cream, Hydroxyzine PO - itching/rash Melatonin - sleep Methocarbamol - muscle spasms Ondansetron - nausea/vomiting Sorbitol, Fleets enema - constipation     Type of Medication Issue Identified Description of Issue Recommendation(s)  Drug Interaction(s) (clinically significant)     Duplicate Therapy     Allergy     No Medication Administration End Date  Discharge note indicates plan for 10 days of sodium bicarbonate. Monitor electrolytes and add stop time when appropriate.  Incorrect Dose     Additional Drug Therapy Needed     Significant med changes from prior encounter (inform family/care partners about these prior to discharge). Entresto discontinued. Oxymetazoline nasal spray at bedtime discontinued. Amiodarone 200 mg BID > daily. New Midodrine, sodium bicarbonate, gabapentin.  Discharge summary notes plan to resume Atorvastatin, Ambien and B-12. Communicate changes with patient/family prior to discharge.     olding Atorvastatin due to elevated LFTs. Using Melatonin for sleep. Resume B-12 while on CIR or at discharge.    Other       Clinically significant medication issues were identified that warrant physician communication and completion of prescribed/recommended actions by midnight of the next day:  No  Pharmacist comments:  - noted received steroids for rash while inpatient. Prednisone 40 mg (12/2)>20 mg (12/3)> 10 mg (12/4); then solumedrol 40 mg IV q12h x 4 doses 12/4>>12/5; no doses 12/6, then Prednisone 40 mg x 1 on 12/7.  - Admit note indicates prior Metformin discontinued due to renal function, appears in September 2024. Also prior Ozempic, removed from med list 03/05/23 when reported not taking.  Last filled 01/18/23.  Time spent performing this drug regimen review (minutes):  [id number]   Dennie Fetters, [place] 04/09/2023 12:14 PM

## 2023-04-09 NOTE — Consult Note (Signed)
[facility]    Date of Admission:  04/08/2023     Total days of antibiotics 12               Reason for Consult: Leukocytosis   Referring Provider: Dr. Shearon Stalls  Primary Care Provider: [name], MD   ASSESSMENT:  [name] is a 77 y/o caucasian male admitted with worsening right hip pain and found to have a retroperitoneal hematoma extending into the distal right psoas and iliacus muscle. No surgical intervention was warranted. There was also concern for pneumonia which was treated with azithromycin and ceftriaxone. Now concern for leukocytosis with most recent being 43,700. Several differentials are in play here including the recent corticosteroids, the now removed right CVC catheter, and the multifocal fluid collection along the iliopsoas. Does have neutrophilia with most recent differential that is worrisome for potential infection although this could be the result of corticosteroids. Discussed recommendations to stop antibiotics and monitor CBC over the next 24-48 hours. If there is no improvement or worsening would consider additional imaging of right hip and consideration of IR aspiration to ensure there is no infection present.  Remaining medical and supportive care per Primary Team.   PLAN:  Discontinue Augmentin Monitor CBC for changes over the next 24-48 hours. If no improvements would consider additional imaging of right hip and possible IR aspiration.  Remaining medical and supportive care per Primary Team.    Principal Problem:   Debility Active Problems:   Leukocytosis    amiodarone  200 mg Oral Daily   apixaban  5 mg Oral BID   gabapentin  100 mg Oral TID   insulin aspart  0-15 Units Subcutaneous TID WC   insulin aspart  0-5 Units Subcutaneous QHS   midodrine  10 mg Oral TID WC   senna-docusate  1 tablet Oral BID   sodium bicarbonate  650 mg Oral BID   torsemide  20 mg Oral Daily     HPI: [name] [name] is a 77 y.o.  male with previous medical history of hypertension, bladder cancer, paroxysmal atrial fibrillation, peripheral vascular disease and diabetes    [name] was initially seen on 03/04/23 following a syncopal episode and fall. Head CT and chest x-ray were unremarkable. Was noted to have leukocytosis of 22 at the time despite no evidence of infection and questionable viral etiology and was not treated with any antibiotics and on discharge was down to 14.1. Returned to the ED on 03/25/23 with progressively worsening right hip pain. WBC count was 14.2 CT right hip with retroperitoneal hematoma extending into the distal right psoas and iliacus muscle. CT chest with concern for possible pneumonia and was treated with azithromycin and ceftriaxone and completed course on 11/29. Had increasing WBC count on 04/03/23 of unclear origin and was started on piperacillin-tazobactam which continued for 3 days before transitioning to amoxicillin-clavulanate which he continues to receive today. Initially received a 3 day course of prednisone (40-20-10) from 04/01/23 until 04/03/23 and then a secondary dose of 40 mg on 04/06/23.   Discharged to CIR. Has been afebrile with continued leukocytosis with WBC count 43,700. Differential also show significant neutrophilia. CT on 04/03/23 with no significant change in multifocal fluid collection and along the course of the right iliopsoas. Wife is at beside. Feeling okay. Continues to have right hip pain which is slightly improved since admission. No shortness of breath, fevers, or chills. Central line was removed earlier today.  Review of Systems: Review of Systems  Constitutional:  Negative for chills, fever and weight loss.  Respiratory:  Negative for cough, shortness of breath and wheezing.   Cardiovascular:  Negative for chest pain and leg swelling.  Gastrointestinal:  Negative for abdominal pain, constipation, diarrhea, nausea and vomiting.  Musculoskeletal:        Right hip pain   Skin:  Negative for rash.     Past Medical History:  Diagnosis Date   AICD (automatic cardioverter/defibrillator) present    Arthritis    Bladder cancer (HCC)    Borderline glaucoma    Chronic systolic (congestive) heart failure (HCC)    Coronary artery disease    Diabetes mellitus type 2, controlled (HCC) [facility]   Essential hypertension    Heart murmur    Hepatitis    Medical history non-contributory    Paroxysmal A-fib (HCC)    Paroxysmal A-fib (HCC) 08/20/2020   Peripheral vascular disease (HCC)    Pneumonia     Social History   Tobacco Use   Smoking status: Former    Current packs/day: 0.00    Average packs/day: 1 pack/day for 40.0 years (40.0 ttl pk-yrs)    Types: Cigarettes    Start date: 03/31/1977    Quit date: 03/31/2017    Years since quitting: 6.0   Smokeless tobacco: Never  Vaping Use   Vaping status: Never Used  Substance Use Topics   Alcohol use: Not Currently    Comment: rarely   Drug use: No    Family History  Problem Relation Age of Onset   Heart attack Sister    Heart disease Sister     Allergies  Allergen Reactions   Spironolactone Other (See Comments)    Painful gynecomastia   Semaglutide     Nausea and bloating   Tramadol Other (See Comments)    Sleep disturbance.  Apnea spells.   Empagliflozin Rash    Rash at the groin Jardiance   Niacin And Related Hives    OBJECTIVE: Blood pressure 125/70, pulse 90, temperature 98.2 F (36.8 C), resp. rate 17, height 6\' 2"  (1.88 m), weight 89.4 kg, SpO2 94%.  Physical Exam Constitutional:      General: He is not in acute distress.    Appearance: He is well-developed.  Cardiovascular:     Rate and Rhythm: Normal rate and regular rhythm.     Heart sounds: Normal heart sounds.  Pulmonary:     Effort: Pulmonary effort is normal.     Breath sounds: Normal breath sounds.  Skin:    General: Skin is warm and dry.  Neurological:     Mental Status: He is alert and oriented to person,  place, and time.     Lab Results Lab Results  Component Value Date   WBC 43.7 (H) 04/09/2023   HGB 10.9 (L) 04/09/2023   HCT 31.9 (L) 04/09/2023   MCV 78.4 (L) 04/09/2023   PLT 584 (H) 04/09/2023    Lab Results  Component Value Date   CREATININE 1.93 (H) 04/09/2023   BUN 37 (H) 04/09/2023   NA 131 (L) 04/09/2023   K 4.0 04/09/2023   CL 94 (L) 04/09/2023   CO2 26 04/09/2023    Lab Results  Component Value Date   ALT 141 (H) 04/09/2023   AST 98 (H) 04/09/2023   ALKPHOS 108 04/09/2023   BILITOT 1.6 (H) 04/09/2023     Microbiology: Recent Results (from the past 240 hour(s))  Culture, blood (Routine X  2) w Reflex to ID Panel     Status: None   Collection Time: 04/02/23  9:26 AM   Specimen: BLOOD  Result Value Ref Range Status   Specimen Description BLOOD BLOOD RIGHT HAND  Final   Special Requests   Final    BOTTLES DRAWN AEROBIC AND ANAEROBIC Blood Culture results may not be optimal due to an inadequate volume of blood received in culture bottles   Culture   Final    NO GROWTH 5 DAYS Performed at [facility], [address], [place], [place] [address]    Report Status 04/07/2023 FINAL  Final  Culture, blood (Routine X 2) w Reflex to ID Panel     Status: None   Collection Time: 04/02/23  9:26 AM   Specimen: BLOOD  Result Value Ref Range Status   Specimen Description BLOOD BLOOD RIGHT HAND  Final   Special Requests   Final    BOTTLES DRAWN AEROBIC AND ANAEROBIC Blood Culture results may not be optimal due to an inadequate volume of blood received in culture bottles   Culture   Final    NO GROWTH 5 DAYS Performed at [facility], [address], [place], [place] [address]    Report Status 04/07/2023 FINAL  Final     Marcos Eke, NP [facility]  04/09/2023  3:27 PM

## 2023-04-09 NOTE — Evaluation (Signed)
Physical Therapy Assessment and Plan  Patient Details  Name: [name] [name] MRN: [id number] Date of Birth: 1946/01/16  PT Diagnosis: Abnormality of gait, Difficulty walking, Muscle weakness, and Pain in RLE Rehab Potential: Good ELOS: 2 weeks   Today's Date: 04/09/2023 PT Individual Time: 1105-1203 + [id number] PT Individual Time Calculation (min): 58 min  + 74 min  Hospital Problem: Principal Problem:   Debility   Past Medical History:  Past Medical History:  Diagnosis Date   AICD (automatic cardioverter/defibrillator) present    Arthritis    Bladder cancer (HCC)    Borderline glaucoma    Chronic systolic (congestive) heart failure (HCC)    Coronary artery disease    Diabetes mellitus type 2, controlled (HCC) ORAL MED   Essential hypertension    Heart murmur    Hepatitis    Medical history non-contributory    Paroxysmal A-fib (HCC)    Paroxysmal A-fib (HCC) 08/20/2020   Peripheral vascular disease (HCC)    Pneumonia    Past Surgical History:  Past Surgical History:  Procedure Laterality Date   CARDIOVERSION N/A 08/10/2021   Procedure: CARDIOVERSION;  Surgeon: Dolores Patty, MD;  Location: [facility] ENDOSCOPY;  Service: Cardiovascular;  Laterality: N/A;   CARDIOVERSION N/A 11/09/2022   Procedure: CARDIOVERSION;  Surgeon: Maisie Fus, MD;  Location: [facility] INVASIVE CV LAB;  Service: Cardiovascular;  Laterality: N/A;   CARDIOVERSION N/A 03/07/2023   Procedure: CARDIOVERSION (CATH LAB);  Surgeon: Tessa Lerner, DO;  Location: [facility] INVASIVE CV LAB;  Service: Cardiovascular;  Laterality: N/A;   CAROTID DUPLEX SCAN  07-26-2010   BILATERAL ICA  STENOSIS 1% - 39%   CCM GENERATOR INSERTION N/A 01/04/2023   Procedure: CCM GENERATOR INSERTION;  Surgeon: Duke Salvia, MD;  Location: [facility] INVASIVE CV LAB;  Service: Cardiovascular;  Laterality: N/A;   CHOLECYSTECTOMY N/A 08/22/2020   Procedure: LAPAROSCOPIC CHOLECYSTECTOMY;  Surgeon: Emelia Loron, MD;  Location: [facility] OR;  Service:  General;  Laterality: N/A;   CORONARY ARTERY BYPASS GRAFT N/A 04/08/2017   Procedure: CORONARY ARTERY BYPASS GRAFTING (CABG) TIMES TWO USING LEFT INTERNAL MAMMARY ARTERY AND LEFT SAPHENOUS LEG VEIN HARVESTED ENDOSCOPICALLY.  LEG VEIN ALSO HARVESTED FROM THE RIGHT LEG;  Surgeon: Alleen Borne, MD;  Location: [facility] OR;  Service: Open Heart Surgery;  Laterality: N/A;   CYSTOSCOPY WITH BIOPSY N/A 08/01/2012   Procedure: CYSTOSCOPY WITH BIOPSY BLADDER BIOPSY   ;  Surgeon: Antony Haste, MD;  Location: [facility];  Service: Urology;  Laterality: N/A;   ERCP N/A 08/21/2020   Procedure: ENDOSCOPIC RETROGRADE CHOLANGIOPANCREATOGRAPHY (ERCP);  Surgeon: Jeani Hawking, MD;  Location: [facility] ENDOSCOPY;  Service: Endoscopy;  Laterality: N/A;   FULGURATION OF BLADDER TUMOR N/A 08/01/2012   Procedure: FULGURATION OF BLADDER TUMOR;  Surgeon: Antony Haste, MD;  Location: [facility];  Service: Urology;  Laterality: N/A;   ICD IMPLANT  11/20/2017   ICD IMPLANT N/A 11/20/2017   Procedure: ICD IMPLANT;  Surgeon: Duke Salvia, MD;  Location: [facility] INVASIVE CV LAB;  Service: Cardiovascular;  Laterality: N/A;   IR FLUORO GUIDE CV LINE RIGHT  04/01/2023   IR US GUIDE VASC ACCESS RIGHT  04/01/2023   LEFT HEART CATH AND CORONARY ANGIOGRAPHY N/A 03/31/2017   Procedure: LEFT HEART CATH AND CORONARY ANGIOGRAPHY;  Surgeon: Marykay Lex, MD;  Location: [facility] INVASIVE CV LAB;  Service: Cardiovascular;  Laterality: N/A;   LUMBAR LAMINECTOMY/DECOMPRESSION MICRODISCECTOMY Bilateral 06/16/2019   Procedure: Bilateral Lumbar Four-Five Laminectomy and Foraminotomy;  Surgeon:  Julio Sicks, MD;  Location: [facility] OR;  Service: Neurosurgery;  Laterality: Bilateral;  posterior   REMOVAL OF STONES  08/21/2020   Procedure: REMOVAL OF STONES;  Surgeon: Jeani Hawking, MD;  Location: [facility] ENDOSCOPY;  Service: Endoscopy;;   RIGHT HEART CATH Right 03/31/2017   Procedure: RIGHT HEART CATH;  Surgeon: Marykay Lex,  MD;  Location: [facility] INVASIVE CV LAB;  Service: Cardiovascular;  Laterality: Right;   SPHINCTEROTOMY  08/21/2020   Procedure: SPHINCTEROTOMY;  Surgeon: Jeani Hawking, MD;  Location: [facility] ENDOSCOPY;  Service: Endoscopy;;   TEE WITHOUT CARDIOVERSION N/A 04/08/2017   Procedure: TRANSESOPHAGEAL ECHOCARDIOGRAM (TEE);  Surgeon: Alleen Borne, MD;  Location: [facility] OR;  Service: Open Heart Surgery;  Laterality: N/A;   TRANSESOPHAGEAL ECHOCARDIOGRAM (CATH LAB) N/A 03/07/2023   Procedure: TRANSESOPHAGEAL ECHOCARDIOGRAM;  Surgeon: Tessa Lerner, DO;  Location: [facility] INVASIVE CV LAB;  Service: Cardiovascular;  Laterality: N/A;   TRANSURETHRAL RESECTION OF BLADDER TUMOR  05/29/2011   Procedure: TRANSURETHRAL RESECTION OF BLADDER TUMOR (TURBT);  Surgeon: Antony Haste, MD;  Location: [facility];  Service: Urology;  Laterality: N/A;   VENTRICULAR ASSIST DEVICE INSERTION Right 03/31/2017   Procedure: VENTRICULAR ASSIST DEVICE INSERTION;  Surgeon: Marykay Lex, MD;  Location: [facility] INVASIVE CV LAB;  Service: Cardiovascular;  Laterality: Right;    Assessment & Plan Clinical Impression: Patient is a 77 year old male with medical hx significant for: HTN, CAD, paroxysmal A-fib/flutter, chronic systolic CHF, s/p CCM/PM, PVD, DM II, bladder CA, CKD stage IIIb, right distal clavicle fx.  Pt presented to [facility] on 03/25/23 d/t right hip pain. Pt recently admitted to hospital 11/4-11/8 after a fall resulting in right distal clavicle fx. X-rays of hip negative for fracture or dislocation at that time. Pt reported hip pain increased and and was having pain with weightbearing since returning home.  Hip x-ray negative for acute abnormalities. CT hip revealed retroperitoneal hematoma. CT abdomen/pelvis showed intramuscular hemorrhaging extending into iliacus muscle and iliopsoas on right. CT chest suggestive of PNA. Pt placed on 2L of O2 via nasal cannula. Cardiology consulted d/t acute on chronic systolic  CHF. Started IV Lasix. Nephrology consulted d/t creatinine of 3.29.No HD indicated at this time. Pt with RLE swelling 04/08/23, venous duplex ordered.  Therapy evaluations completed and CIR recommended d/t pt's deficits in functional mobility.    Patient currently requires max with mobility secondary to muscle weakness, decreased cardiorespiratoy endurance and decreased oxygen support,  , and decreased standing balance, decreased postural control, and decreased balance strategies.  Prior to hospitalization, patient was independent  with mobility and lived with Spouse in a House home.  Home access is 2Stairs to enter.  Patient will benefit from skilled PT intervention to maximize safe functional mobility, minimize fall risk, and decrease caregiver burden for planned discharge home with 24 hour assist.  Anticipate patient will benefit from follow up [facility] at discharge.  PT - End of Session Activity Tolerance: Tolerates 30+ min activity with multiple rests Endurance Deficit: Yes Endurance Deficit Description: rest breaks with all mobility PT Assessment Rehab Potential (ACUTE/IP ONLY): Good PT Barriers to Discharge: Inaccessible home environment;Home environment access/layout PT Barriers to Discharge Comments: 2 STE, main bedroom upstairs PT Patient demonstrates impairments in the following area(s): Balance PT Transfers Functional Problem(s): Bed Mobility;Bed to Chair;Car PT Locomotion Functional Problem(s): Ambulation;Stairs PT Plan PT Intensity: Minimum of 1-2 x/day ,45 to 90 minutes PT Frequency: 5 out of 7 days PT Duration Estimated Length of Stay: 2 weeks PT  Treatment/Interventions: Ambulation/gait training;Disease management/prevention;Pain management;Stair training;Balance/vestibular training;DME/adaptive equipment instruction;Patient/family education;Therapeutic Activities;Psychosocial support;Therapeutic Exercise;Community reintegration;Functional mobility training;UE/LE Strength  taining/ROM;Discharge planning;Neuromuscular re-education;UE/LE Coordination activities PT Transfers Anticipated Outcome(s): supervision PT Locomotion Anticipated Outcome(s): supervision ambulatory PT Recommendation Recommendations for Other Services: None Follow Up Recommendations: Home health PT Patient destination: Home Equipment Recommended: Rolling walker with 5" wheels   PT Evaluation Precautions/Restrictions Precautions Precautions: Fall Precaution Comments: watch BP and HR Restrictions Weight Bearing Restrictions: No RUE Weight Bearing: Weight bearing as tolerated Pain Interference Pain Interference Pain Effect on Sleep: 2. Occasionally Pain Interference with Therapy Activities: 3. Frequently Pain Interference with Day-to-Day Activities: 3. Frequently Home Living/Prior Functioning Home Living Available Help at Discharge: Family;Available 24 hours/day Type of Home: House Home Access: Stairs to enter [employer] of Steps: 2 Entrance Stairs-Rails: None Home Layout: Multi-level;Bed/bath upstairs (half bath downstairs) Alternate Level Stairs-Number of Steps: flight, stair lift to be put in Alternate Level Stairs-Rails: Left Bathroom Shower/Tub: Health visitor: Standard Bathroom Accessibility: Yes Additional Comments: stair lift being installed  Lives With: Spouse Prior Function Level of Independence: Independent with basic ADLs;Independent with gait;Independent with transfers  Able to Take Stairs?: Yes Driving: Yes Vocation: Retired Marine scientist Requirements: [employer] Cognition Overall Cognitive Status: Impaired/Different from baseline Arousal/Alertness: Awake/alert Memory: Impaired Memory Impairment: Decreased recall of new information;Decreased short term memory Decreased Short Term Memory: Verbal complex;Functional complex Awareness: Appears intact Problem Solving: Impaired Problem Solving Impairment: Verbal complex;Functional  complex Executive Function: Sequencing Safety/Judgment: Appears intact Comments: slower processing and disorganized with fear of pain Sensation Sensation Light Touch: Impaired by gross assessment Hot/Cold: Appears Intact Proprioception: Appears Intact Stereognosis: Appears Intact Additional Comments: reports hypersensitivity in lateral R thigh Coordination Gross Motor Movements are Fluid and Coordinated: No Fine Motor Movements are Fluid and Coordinated: No Coordination and Movement Description: decreased smoothness and accuracy Finger Nose Finger Test: some dysmetria noted 9 Hole Peg Test: TBA later visit Motor  Motor Motor: Other (comment) Motor - Skilled Clinical Observations: generalized weakness, poor tolerance to weightbearing and all ROM with RLE  Trunk/Postural Assessment  Cervical Assessment Cervical Assessment: Within Functional Limits Thoracic Assessment Thoracic Assessment: Within Functional Limits Lumbar Assessment Lumbar Assessment: Exceptions to [facility] (posterior pelvic tilt) Postural Control Postural Control: Deficits on evaluation Righting Reactions: delayed and inadequate Protective Responses: decreased Postural Limitations: decreased  Balance Balance Balance Assessed: Yes Static Sitting Balance Static Sitting - Balance Support: Bilateral upper extremity supported;Feet supported Static Sitting - Level of Assistance: 5: Stand by assistance Static Standing Balance Static Standing - Balance Support: Bilateral upper extremity supported Static Standing - Level of Assistance: 3: Mod assist Dynamic Standing Balance Dynamic Standing - Balance Support: Bilateral upper extremity supported;During functional activity Dynamic Standing - Level of Assistance: 3: Mod assist Extremity Assessment  RUE Assessment RUE Assessment: Exceptions to [facility] General Strength Comments: 4-/5 R sh and grip TBA LUE Assessment LUE Assessment: Within Functional Limits General Strength  Comments: TBA later session RLE Assessment RLE Assessment: Exceptions to [facility] General Strength Comments: limited by pain, tested in sitting RLE Strength Right Hip Flexion: 1/5 Right Hip Extension: 3-/5 Right Knee Extension: 2-/5 Right Ankle Dorsiflexion: 3+/5 Right Ankle Plantar Flexion: 3/5 LLE Assessment LLE Assessment: Exceptions to [facility] General Strength Comments: grossly 4/5  Care Tool Care Tool Bed Mobility Roll left and right activity   Roll left and right assist level: Minimal Assistance - Patient > 75%    Sit to lying activity   Sit to lying assist level: 2 Helpers    Lying to sitting on side  of bed activity   Lying to sitting on side of bed assist level: the ability to move from lying on the back to sitting on the side of the bed with no back support.: Maximal Assistance - Patient 25 - 49%     Care Tool Transfers Sit to stand transfer   Sit to stand assist level: Maximal Assistance - Patient 25 - 49%    Chair/bed transfer   Chair/bed transfer assist level: 2 Web designer transfer activity did not occur: Safety/medical concerns        Care Tool Locomotion Ambulation Ambulation activity did not occur: Safety/medical concerns (amb 8' in //bars with min assist and +2 for WC follow)        Walk 10 feet activity Walk 10 feet activity did not occur: Safety/medical concerns       Walk 50 feet with 2 turns activity Walk 50 feet with 2 turns activity did not occur: Safety/medical concerns      Walk 150 feet activity Walk 150 feet activity did not occur: Safety/medical concerns      Walk 10 feet on uneven surfaces activity Walk 10 feet on uneven surfaces activity did not occur: Safety/medical concerns      Stairs Stair activity did not occur: Safety/medical concerns        Walk up/down 1 step activity Walk up/down 1 step or curb (drop down) activity did not occur: Safety/medical concerns      Walk up/down 4 steps activity Walk up/down 4 steps  activity did not occur: Safety/medical concerns      Walk up/down 12 steps activity Walk up/down 12 steps activity did not occur: Safety/medical concerns      Pick up small objects from floor Pick up small object from the floor (from standing position) activity did not occur: Safety/medical concerns      Wheelchair Is the patient using a wheelchair?: Yes Type of Wheelchair: Manual   Wheelchair assist level: Contact Guard/Touching assist Max wheelchair distance: 50'  Wheel 50 feet with 2 turns activity   Assist Level: Contact Guard/Touching assist  Wheel 150 feet activity   Assist Level: Moderate Assistance - Patient 50 - 74%    Refer to Care Plan for Long Term Goals  SHORT TERM GOAL WEEK 1 PT Short Term Goal 1 (Week 1): Pt will complete sit<>stand with min assist PT Short Term Goal 2 (Week 1): Pt will complete bed mobility with min assist with LRAD PT Short Term Goal 3 (Week 1): Pt will ambulate 22' with min assist and LRAD PT Short Term Goal 4 (Week 1): Pt will complete up/down 1 step with BHRs mod assist  Recommendations for other services: None   Skilled Therapeutic Intervention SESSION 1: Evaluation completed (see details above and below) with education on PT POC and goals and individual treatment initiated with focus on transfer training, gait training, WC mobility. Pt reporting pain in thigh, states premedicated. Pt falling asleep during subjective portion of evaluation as well as education, reports pain medication making pt feel fatigue. Pt SpO2 monitored throughout session on room air with rest and activity with pt demonstrating SpO2 >95%, RN notified and pt left on room air at end of session. Pt completes WC mobility 74' with BUEs with CGA with verbal cues for maintaining path with pt demonstrating wide turns and path deviation to L. Pt transported to main gym where pt completes x3 sit<>stands in //bars with mod assist with pt completing weighshifting x20 seconds as  pregait  activity. Pt with verbal outburst of pain with stand and weightshifting activity. Pt ambulates in //bars with therapist seated in front of pt to block R knee in stance x8' with WC follow, pt demonstrating forward flexed trunk, antalgic gait with R knee flexed in stance however no buckling noted. Pt transported back to room and completes stand pivot with mod assist x2 with RW WC to bed. Pt completes sit to supine with assist x2 due to pain for managing trunk and BLEs into bed. Pt repositioned toward [facility] with assist x2. Pt remains supine with all needs within reach, call light in place at end of session.   SESSION 2: Pt presents in room in bed, alert and agreeable to PT. Pt reporting pain in R thigh ongoing, reports decreased fatigue compared to earlier session. Session focused on therapeutic activity for education on condition, transfer training, gait training, and therapeutic exercise. NP and MD presenting during session from infectious disease, therapist assist with discussing pt symptoms from session. Pt vitals monitored with pt denying dizziness or SOB, noted below.  Pt completes supine to sit with max assist for BLEs off bed and pt trunk to upright. Pt requires CGA for immediate sitting balance. Pt completes sit<>stand from elevated bed with min assist, continues to demonstrate vocal outburst of pain. Pt completes stand pivot with mod assist and RW, cues to increase proximity to RW. Pt transported to day room for time management.  Pt completes sit<>stand to RW from [facility] with max assist and increased time, cues for hand placement with unilateral UE on RW, other UE on WC. Pt maintains standing ~15 seconds before returning to sit. Pt then ambulates with RW 20' with mod assist, cues for step to gait pattern, UE weightbearing to compensate for increased pain, and increasing proximity to RW with pt able to complete.  Pt then completes interval training on kinetron while seated in WC to promote BLE  strengthening, ROM, and improve tolerance to weightbearing and mobility with pt denying pain with activity, completes 1 min work/1 min rest at workload 20 CM/sec.  Pt transported back to room dependently for time management. Pt completes stand step transfer with max assist for stand, mod assist for pivot to bed with cues for positioning prior to sitting. Pt completes sit to supine with max assist for BLEs into bed however improved ability to reposition self while in supine with min assist for managing RLE. Pt noted to have heel dressing come off during removal of tennis shoes, NT notified. Pt remains supine with RLE elevated, all needs within reach, call light in place, bed alarm activated at end of session.  Vitals BP 125/70, HR 90 bpm, SpO2 97%   Mobility Bed Mobility Bed Mobility: Sit to Supine Sit to Supine: 2 Helpers Transfers Transfers: Sit to Stand;Stand to [employer] Transfers Sit to Stand: Moderate Assistance - Patient 50-74% Stand to Sit: Moderate Assistance - Patient 50-74% Stand Pivot Transfers: 2 Helpers Transfer (Assistive device): Biomedical engineer Ambulation: Yes Gait Assistance: 2 Helpers Gait Distance (Feet): 8 Feet Assistive device: Parallel bars Gait Assistance Details: Tactile cues for weight beaing;Tactile cues for weight shifting Gait Gait: Yes Gait Pattern: Impaired Gait Pattern: Antalgic;Decreased step length - left;Decreased stance time - right;Right flexed knee in stance;Left flexed knee in stance;Trunk flexed Gait velocity: decreased Stairs / Additional Locomotion Stairs: No Wheelchair Mobility Wheelchair Mobility: Yes Wheelchair Assistance: Veterinary surgeon: Both upper extremities Distance: 50'   Discharge Criteria: Patient will  be discharged from PT if patient refuses treatment 3 consecutive times without medical reason, if treatment goals not met, if there is a change in medical status, if  patient makes no progress towards goals or if patient is discharged from hospital.  The above assessment, treatment plan, treatment alternatives and goals were discussed and mutually agreed upon: by patient  Edwin Cap PT, DPT 04/09/2023, 12:37 PM

## 2023-04-09 NOTE — Progress Notes (Addendum)
PROGRESS NOTE   Subjective/Complaints:  No events overnight.  Patient extremely limited by right lower extremity burning pain, located over the proximal lateral anterior thigh, nonradiating, worse with palpation.  States it is approximately the same problem prior to rehab admission.  On review of labs, hyponatremia mildly worsened today.  LFTs remain elevated but are coming down.  BUN/creatinine stable.  Blood glucose elevated to 230 today.  Reactive thrombocytosis improving, hemoglobin stable, however WBCs continue to increase today 43.7.  Appears on chart review that last dose of oral steroids was on 12-7.  Had extensive inpatient infectious disease workup as below, no infectious disease consult per the chart.  Of note, chronic baseline leukocytosis in the 20s, no apparent workup on outpatient chart reviews.  ROS: Right lower extremity lateral thigh burning pain -ongoing. denies fevers, chills, N/V, abdominal pain, constipation, diarrhea, SOB, cough, chest pain, new weakness or paraesthesias.    Objective:   VAS [place] LOWER EXTREMITY VENOUS (DVT)  Result Date: 04/09/2023  Lower Venous DVT Study Patient Name:  KALIB STARKOVICH  Date of Exam:   04/08/2023 Medical Rec #: [id number]         Accession #:    [id number] Date of Birth: 1945-09-22         Patient Gender: M Patient Age:   77 years Exam Location:  [facility] Procedure:      VAS [place] LOWER EXTREMITY VENOUS (DVT) Referring Phys: Willeen Niece --------------------------------------------------------------------------------  Indications: Swelling, and Edema.  Comparison Study: Previous study on 11.26.2024. Performing Technologist: Fernande Bras  Examination Guidelines: A complete evaluation includes B-mode imaging, spectral Doppler, color Doppler, and power Doppler as needed of all accessible portions of each vessel. Bilateral testing is considered an integral part of a complete  examination. Limited examinations for reoccurring indications may be performed as noted. The reflux portion of the exam is performed with the patient in reverse Trendelenburg.  +---------+---------------+---------+-----------+----------+--------------+ RIGHT    CompressibilityPhasicitySpontaneityPropertiesThrombus Aging +---------+---------------+---------+-----------+----------+--------------+ CFV      Full           Yes      Yes                                 +---------+---------------+---------+-----------+----------+--------------+ SFJ      Full                                                        +---------+---------------+---------+-----------+----------+--------------+ FV Prox  Full                                                        +---------+---------------+---------+-----------+----------+--------------+ FV Mid   Full                                                        +---------+---------------+---------+-----------+----------+--------------+  FV DistalFull                                                        +---------+---------------+---------+-----------+----------+--------------+ PFV      Full                                                        +---------+---------------+---------+-----------+----------+--------------+ POP      Full           Yes      Yes                                 +---------+---------------+---------+-----------+----------+--------------+ PTV      Full                                                        +---------+---------------+---------+-----------+----------+--------------+ PERO     Full                                                        +---------+---------------+---------+-----------+----------+--------------+   +----+---------------+---------+-----------+----------+--------------+ LEFTCompressibilityPhasicitySpontaneityPropertiesThrombus Aging  +----+---------------+---------+-----------+----------+--------------+ CFV Full           Yes      Yes                                 +----+---------------+---------+-----------+----------+--------------+     *See table(s) above for measurements and observations. Electronically signed by Carolynn Sayers on 04/09/2023 at 7:24:06 AM.    Final    Recent Labs    04/08/23 0301 04/09/23 0801  WBC 37.8* 43.7*  HGB 10.4* 10.9*  HCT 32.0* 31.9*  PLT 706* 584*   Recent Labs    04/08/23 0301 04/09/23 0801  NA 133* 131*  K 3.5 4.0  CL 96* 94*  CO2 26 26  GLUCOSE 164* 230*  BUN 47* 37*  CREATININE 2.10* 1.93*  CALCIUM 7.7* 7.8*    Intake/Output Summary (Last 24 hours) at 04/09/2023 1039 Last data filed at 04/09/2023 0538 Gross per 24 hour  Intake 720 ml  Output 250 ml  Net 470 ml        Physical Exam: Vital Signs Blood pressure 139/65, pulse 99, temperature 99.2 F (37.3 C), temperature source Oral, resp. rate 17, height 6\' 2"  (1.88 m), weight 89.4 kg, SpO2 94%.   PE: Constitution: Appropriate appearance for age. No apparent distress.  Sitting upright in therapy gym. Resp: No respiratory distress. No accessory muscle usage. on RA and CTAB Cardio: Well perfused appearance.  3+ pitting edema in the right lower extremity; none in left lower extremity. Abdomen: Nondistended. Nontender.  Nondistended.  Positive bowel sounds. Psych: Appropriate mood and affect. Neuro: AAOx4. No apparent cognitive deficits.  Follow all simple commands  and complex problem-solving.  No dysarthria.  Neurologic Exam:   DTRs: Reflexes were 2+ in bilateral achilles, patella, biceps, BR and triceps. Babinsky: flexor responses b/l.   Hoffmans: negative b/l Sensory exam: revealed normal sensation in all dermatomal regions in bilateral upper extremities, bilateral lower extremities, and with reduced sensation to light touch in right anterior lateral thigh Motor exam: strength 5/5 throughout bilateral  upper extremities, left lower extremity, and with exception of right lower extremity 4 out of 5 throughout limited by pain Coordination: Fine motor coordination was normal.             Assessment/Plan: 1. Functional deficits which require 3+ hours per day of interdisciplinary therapy in a comprehensive inpatient rehab setting. Physiatrist is providing close team supervision and 24 hour management of active medical problems listed below. Physiatrist and rehab team continue to assess barriers to discharge/monitor patient progress toward functional and medical goals  Care Tool:  Bathing              Bathing assist       Upper Body Dressing/Undressing Upper body dressing        Upper body assist      Lower Body Dressing/Undressing Lower body dressing            Lower body assist       Toileting Toileting    Toileting assist       Transfers Chair/bed transfer  Transfers assist           Locomotion Ambulation   Ambulation assist              Walk 10 feet activity   Assist           Walk 50 feet activity   Assist           Walk 150 feet activity   Assist           Walk 10 feet on uneven surface  activity   Assist           Wheelchair     Assist               Wheelchair 50 feet with 2 turns activity    Assist            Wheelchair 150 feet activity     Assist          Blood pressure 139/65, pulse 99, temperature 99.2 F (37.3 C), temperature source Oral, resp. rate 17, height 6\' 2"  (1.88 m), weight 89.4 kg, SpO2 94%.  Medical Problem List and Plan: 1. Functional deficits secondary to retroperitoneal hematoma after recent fall.             -also right right AC jt injury, ?clavicle fx             -patient may  shower  -Stable to continue CIR             -ELOS/Goals: 10-12 days, supervision goals with PT, OT - 12/23 DC date   - 12/10: Family installing chair lift. Has HH in  place. Functionally limited by RLE pain.    2.  Antithrombotics: -DVT/anticoagulation:  Pharmaceutical: Eliquis             -antiplatelet therapy: none             -pt with new right lower extremity edema and thigh pain. Dopplers pending                         -  eliquis recently resumed, so findings won't change medical mgt 3. Pain Management: Tylenol, oxycodone as needed for hip              -right thigh pain. ?muscle strain. No sensory findings. Most recent CT reviewed.  It's possible he's had further migration of blood further down into the thigh as there was blood along the distal iliopsoas on that CT                         -add prn robaxin                         -order kpad                          -dopplers pending as above   - 12/10: Per OT Max 2 OOB due to leg pain.  On exam,?  Meralgia paresthetica based on distribution, will add gabapentin 100 mg 3 times daily due to renal deficiency.  Increase oxycodone to 5 to 10 mg as needed.  Not scheduled Tylenol due to elevated LFTs.  4. Mood/Behavior/Sleep: LCSW to evaluate and provide emotional support             -continue melatonin 10 mg              -antipsychotic agents: n/a   5. Neuropsych/cognition: This patient is capable of making decisions on his own behalf.    - 12/10: SLP consult for reported cognitive deficits; may be medication induced per wife  6. Skin/Wound Care: Routine skin care checks   - Stage 2 to sacrum reported   - Lateral trunk rash managed with steroid creams   7. Fluids/Electrolytes/Nutrition: Strict Is and Os and follow-up chemistries   - 12/10: Fluid restriction for mild hyponatremia today.  Repeat tomorrow a.m. 8: Hypertension: monitor TID and prn (Entresto, torsemide held)             -currently on midodrine 10 mg TID for hypotension      04/09/2023    5:34 AM 04/08/2023    8:09 PM 04/08/2023    5:45 PM  Vitals with BMI  Height   6\' 2"   Weight   197 lbs  BMI   25.28  Systolic 139 124   Diastolic  65 64   Pulse 99 92     9:  Cardiorenal syndrome/acute kidney Injury on chronic kidney disease stage IIIb (baseline creatinine around 2)              -milrinone discontinued 12/08             -no indication for HD             -daily weight             -continue sodium bicarbonate 650 mg BID             -continue torsemide 20 mg daily  12/10: Renal function appears stable.     10: Fall/right retroperitoneal hematoma: now back on Eliquis; H and H stable             -follow-up CBC - stable   11: Atypical atrial flutter; hx of pAF: rate controlled (low 100s)             -on Eliquis, amiodarone   12: COPD: former tobacco smoker, quit 2018   13: DM-2: CBGs QID; A1c = 6.9% (  home on Ozempic; metformin discontinued due to CKD)             -continue SSI   14: CAD: s/p 2-vessel CABG 2018             -restart statin once LFTs normalized             -follow-up CMP   15: Acute on chronic systolic and diastolic heart failure, hx of stage D systolic heart failure post CCM             -continue amiodarone 200 mg daily             -continue torsemide 20 mg daily   16: Elevated transaminases, up trending: ? due to congestive hepatopathy             -follow-up CMP   17: Multiple falls; AC joint injury/possible right distal clavicular fracture 11/05   18: Possible pneumonia: continue Augmentin to finish the course   - 12/10:  Repeat chest x-ray pending.  19: Leukocytosis, chronic: negative infectious work-up except possible PNA; had short course steroids for skin rash             -appears to be off prednisone now             -on Augmentin (end 12/11)             -follow-up CBC with diff on 12/10  12/10: Uptrending leukocytosis 28.3 -> 37.8 -> 43.7 despite stopping p.o. steroids on 12-7.  Extensive inpatient workup for leukocytosis described in H&P.  Does have chronic underlying leukocytosis in the 20s.  Will consult infectious disease for additional workup/ideas today, feel less likely reactive  given that thrombocytosis is improving and other parameters remained stable.    20: Lung nodule: follow-up outpatient with PCP   21: Hx of bladder cancer s/p TURP 2013   22: Skin rash treated with prednisone/hydroxyzine/calamine lotion             -appears prednisone has been discontinued   23: Chronic anemia: H and H stable times 72 hours             -follow-up CBC-stable   24: Thrombocytosis; fluctuating: follow-up CBC.  Improving.    LOS: 1 days A FACE TO FACE EVALUATION WAS PERFORMED  Angelina Sheriff 04/09/2023, 10:39 AM

## 2023-04-09 NOTE — Progress Notes (Signed)
Educated patient on fluid restrictions and why he is on it due to him mentioning to nurse that he "gets dry here". Nurse informed how many mls are in each of the styrofoam cups that he gets. He said so I can have 4. Nurse encouraged him to be as compliant as he can.

## 2023-04-09 NOTE — Patient Care Conference (Signed)
Inpatient RehabilitationTeam Conference and Plan of Care Update Date: 04/09/2023   Time: 10:41 AM    Patient Name: [name] [name]      Medical Record Number: [id number]  Date of Birth: 1946/03/05 Sex: Male         Room/Bed: 4W20C/4W20C-01 Payor Info: Payor: MEDICARE / Plan: MEDICARE PART A AND B / Product Type: *No Product type* /    Admit Date/Time:  04/08/2023  5:09 PM  Primary Diagnosis:  Debility  Hospital Problems: Principal Problem:   Debility    Expected Discharge Date: Expected Discharge Date: 04/22/23 (ELOS 2 weeks)  Team Members Present: Physician leading conference: Dr. Elijah Birk Social Worker Present: Cecile Sheerer, LCSWA Nurse Present: Chana Bode, RN PT Present: Darrold Span, PT OT Present: Valetta Fuller, OT PPS Coordinator present : Edson Snowball, PT     Current Status/Progress Goal Weekly Team Focus  Bowel/Bladder   patient is continent of b/b LBM 12/8   maintain continence   offer toileing qshift and PRN    Swallow/Nutrition/ Hydration               ADL's   total a for LB bathing and dressing, min A/CGA for UB sink side self care, max A SPT bed to w/c and BSC, significantly limited by R LE pain, pt reports decreased STM and proessing   S UB, CGA LB and ADL/bathroom mobility   pain management, improve activity tolerance for ADL's, introduce AE for LB self care    Mobility   eval pending           Communication                Safety/Cognition/ Behavioral Observations               Pain   patient c/o right hip pain 8/10   <4/10 on pain scale   assess pain qshift and PRN using ice and medication as needed    Skin   patient has some bruising bilaterally   maintain skin integrity  assess skin qshift and PRN looking for signs of skin breakdown      Discharge Planning:  TBA. D/c to home with his wife who will be primary caregiver. Wife reports they will ahve support from her family, and their dtr and SIL live close by  and are retired and can help PRN. SW will confirm there are no barriers to discharge.   Team Discussion: Patient with debility; limited by hip pain, and memory issues.  Patient on target to meet rehab goals: Evals pending  *See Care Plan and progress notes for long and short-term goals.   Revisions to Treatment Plan:  CXR HH diet with 1200 cc FR  SLP service eval and treat  Teaching Needs: Safety, medications, dietary modification, transfers, toileting, etc.   Current Barriers to Discharge: Decreased caregiver support and Home enviroment access/layout  Possible Resolutions to Barriers: Family education Stair lift for home     Medical Summary Current Status: Medically complicated by leukocytosis, reactive thrombocytosis, hyponatremia, hypotension,, poor pain control, poor p.o. intakes, and constipation.  Barriers to Discharge: Inadequate Nutritional Intake;Self-care education;Renal Insufficiency/Failure;Medical stability;Uncontrolled Pain   Possible Resolutions to [employer] Focus: Monitor labs and vitals, infectious disease consult to evaluate for leukocytosis which was negative for extensive workup inpatient, encourage p.o. intakes, fluid restriction for hyponatremia, titrate pain medication to lowest tolerable dose due to cognitive side effects,   Continued Need for Acute Rehabilitation Level of Care: The patient requires daily medical management  by a physician with specialized training in physical medicine and rehabilitation for the following reasons: Direction of a multidisciplinary physical rehabilitation program to maximize functional independence : Yes Medical management of patient stability for increased activity during participation in an intensive rehabilitation regime.: Yes Analysis of laboratory values and/or radiology reports with any subsequent need for medication adjustment and/or medical intervention. : Yes   I attest that I was present, lead the team  conference, and concur with the assessment and plan of the team.   Chana Bode B 04/09/2023, 2:10 PM

## 2023-04-10 ENCOUNTER — Encounter: Payer: Medicare Other | Admitting: Internal Medicine

## 2023-04-10 DIAGNOSIS — R5381 Other malaise: Secondary | ICD-10-CM | POA: Diagnosis not present

## 2023-04-10 LAB — CBC WITH DIFFERENTIAL/PLATELET
Abs Immature Granulocytes: 0.71 10*3/uL — ABNORMAL HIGH (ref 0.00–0.07)
Basophils Absolute: 0.1 10*3/uL (ref 0.0–0.1)
Basophils Relative: 0 %
Eosinophils Absolute: 0 10*3/uL (ref 0.0–0.5)
Eosinophils Relative: 0 %
HCT: 29.1 % — ABNORMAL LOW (ref 39.0–52.0)
Hemoglobin: 9.8 g/dL — ABNORMAL LOW (ref 13.0–17.0)
Immature Granulocytes: 2 %
Lymphocytes Relative: 2 %
Lymphs Abs: 0.8 10*3/uL (ref 0.7–4.0)
MCH: 26.3 pg (ref 26.0–34.0)
MCHC: 33.7 g/dL (ref 30.0–36.0)
MCV: 78 fL — ABNORMAL LOW (ref 80.0–100.0)
Monocytes Absolute: 1.2 10*3/uL — ABNORMAL HIGH (ref 0.1–1.0)
Monocytes Relative: 3 %
Neutro Abs: 32.5 10*3/uL — ABNORMAL HIGH (ref 1.7–7.7)
Neutrophils Relative %: 93 %
Platelets: 522 10*3/uL — ABNORMAL HIGH (ref 150–400)
RBC: 3.73 MIL/uL — ABNORMAL LOW (ref 4.22–5.81)
RDW: 18.3 % — ABNORMAL HIGH (ref 11.5–15.5)
WBC: 35.2 10*3/uL — ABNORMAL HIGH (ref 4.0–10.5)
nRBC: 0 % (ref 0.0–0.2)

## 2023-04-10 LAB — GLUCOSE, CAPILLARY
Glucose-Capillary: 124 mg/dL — ABNORMAL HIGH (ref 70–99)
Glucose-Capillary: 245 mg/dL — ABNORMAL HIGH (ref 70–99)
Glucose-Capillary: 233 mg/dL — ABNORMAL HIGH (ref 70–99)
Glucose-Capillary: 203 mg/dL — ABNORMAL HIGH (ref 70–99)

## 2023-04-10 LAB — BASIC METABOLIC PANEL
Anion gap: 6 (ref 5–15)
BUN: 34 mg/dL — ABNORMAL HIGH (ref 8–23)
CO2: 27 mmol/L (ref 22–32)
Calcium: 7.3 mg/dL — ABNORMAL LOW (ref 8.9–10.3)
Chloride: 96 mmol/L — ABNORMAL LOW (ref 98–111)
Creatinine, Ser: 1.89 mg/dL — ABNORMAL HIGH (ref 0.61–1.24)
GFR, Estimated: 36 mL/min — ABNORMAL LOW (ref >60)
Glucose, Bld: 136 mg/dL — ABNORMAL HIGH (ref 70–99)
Potassium: 3.2 mmol/L — ABNORMAL LOW (ref 3.5–5.1)
Sodium: 129 mmol/L — ABNORMAL LOW (ref 135–145)

## 2023-04-10 MED ORDER — MEDIHONEY WOUND/BURN DRESSING EX PSTE
1.0000 | PASTE | Freq: Every day | CUTANEOUS | Status: DC
  Administered 2023-04-11: 1 via TOPICAL
  Filled 2023-04-10: qty 44

## 2023-04-10 MED ORDER — POTASSIUM CHLORIDE CRYS ER 20 MEQ PO TBCR
40.0000 meq | EXTENDED_RELEASE_TABLET | Freq: Every day | ORAL | Status: DC
  Administered 2023-04-10 – 2023-04-11 (×2): 40 meq via ORAL
  Filled 2023-04-10 (×2): qty 2

## 2023-04-10 NOTE — Progress Notes (Signed)
Orthopedic Tech Progress Note Patient Details:  [name] [name] 1945-09-05 [id number]  Prafo boot applied   Ortho Devices Type of Ortho Device: Prafo boot/shoe Ortho Device/Splint Location: LRE Ortho Device/Splint Interventions: Ordered, Application, Adjustment   Post Interventions Patient Tolerated: Well Instructions Provided: Adjustment of device, Care of device  Diannia Ruder 04/10/2023, 9:06 AM

## 2023-04-10 NOTE — Progress Notes (Signed)
Occupational Therapy Session Note  Patient Details  Name: [name] [name] MRN: [id number] Date of Birth: 1945-11-27  Today's Date: 04/10/2023 OT Individual Time: 1100-1155 OT Individual Time Calculation (min): 55 min    Short Term Goals: Week 1:  OT Short Term Goal 1 (Week 1): LB dressing with AE and min A OT Short Term Goal 2 (Week 1): Standing 2 min sink side with LRAD for grooming with min A OT Short Term Goal 3 (Week 1): Toilet/stall shower transfer with LRAD and min A OT Short Term Goal 4 (Week 1): UB self care seated sinkside with set up  Skilled Therapeutic Interventions/Progress Updates:   Pt seen for skilled OT this am and resting bed level after PT session. R LE with heel float over pillow and now has PRAFO for protection. OT brief education re: skin protection and positioning. Pt already dressed so deferring the shower until tomorrow. Pt was agreeable to oob to w/c for shaving. Issued and trained in loaner leg lifter as pt with significant deficits to mobilize R LE in general and in and out of bed. Pt mildly reluctant to use with decreased insight into how much assist he actually requires for the LE. Pt grasped concept somewhat during the course of session and was agreeable to leave n bed to assist with bed positioning as pt unable to maneuver even to place on pillow. Mod A supine to sit with rails, bed features and increased time. Mod A x 1 SPT with heavy support given to prevent R LE buckling out and in to bed and w/c. Pt sat for shaving with set up. OT educated on scapular retraction and ankle pumps 10 reps x 2 sets w/c level. Pt requested back to bed with same level of A. Left pt bed level with R heel float, bed exit engaged, needs and nurse call button in reach.   Pain: 5/10 at rest, 8/10 with mobility- demo use of leg lifter with loaner provided, repositioning, heel float on R LE and rest for relief   Therapy Documentation Precautions:  Precautions Precautions:  Fall Precaution Comments: watch BP and HR Restrictions Weight Bearing Restrictions: No RUE Weight Bearing: Weight bearing as tolerated   Therapy/Group: Individual Therapy  Vicenta Dunning 04/10/2023, 7:56 AM

## 2023-04-10 NOTE — Progress Notes (Signed)
[facility]  Date of Admission:  04/08/2023     Total days of antibiotics 12         ASSESSMENT:  Mr. Hanna's leukocytosis is improved today now down to 35,200 from 43,700. Remains afebrile and no other clear source of infection. Recommend to continue watchful waiting off antibiotics and monitor CBC. Plan of care discussed with Mr. Langseth who is in agreement. Remaining medical and supportive care per Primary Team.   PLAN:  Continue watchful waiting off antibiotics. Monitor CBC for continued improvement. Remaining medical and supportive care per Primary Team.    Principal Problem:   Debility Active Problems:   Leukocytosis    amiodarone  200 mg Oral Daily   apixaban  5 mg Oral BID   gabapentin  100 mg Oral TID   insulin aspart  0-15 Units Subcutaneous TID WC   insulin aspart  0-5 Units Subcutaneous QHS   leptospermum manuka honey  1 Application Topical Daily   midodrine  10 mg Oral TID WC   potassium chloride  40 mEq Oral Daily   senna-docusate  1 tablet Oral BID   sodium bicarbonate  650 mg Oral BID   torsemide  20 mg Oral Daily    SUBJECTIVE:  Afebrile with no acute events. Feeling well.   Allergies  Allergen Reactions   Spironolactone Other (See Comments)    Painful gynecomastia   Semaglutide     Nausea and bloating   Tramadol Other (See Comments)    Sleep disturbance.  Apnea spells.   Empagliflozin Rash    Rash at the groin Jardiance   Niacin And Related Hives     Review of Systems: Review of Systems  Constitutional:  Negative for chills, fever and weight loss.  Respiratory:  Negative for cough, shortness of breath and wheezing.   Cardiovascular:  Negative for chest pain and leg swelling.  Gastrointestinal:  Negative for abdominal pain, constipation, diarrhea, nausea and vomiting.  Skin:  Negative for rash.      OBJECTIVE: Vitals:   04/09/23 2022 04/10/23 0510 04/10/23 0634 04/10/23 1318  BP: 125/72 118/67  (!)  126/57  Pulse: 90 93  92  Resp: 16 16  16   Temp: 97.9 F (36.6 C) 99.4 F (37.4 C)  98.3 F (36.8 C)  TempSrc:    Oral  SpO2: 96% 98%  94%  Weight:   89.8 kg   Height:       Body mass index is 25.42 kg/m.  Physical Exam Constitutional:      General: He is not in acute distress.    Appearance: He is well-developed.  Cardiovascular:     Rate and Rhythm: Normal rate and regular rhythm.     Heart sounds: Normal heart sounds.  Pulmonary:     Effort: Pulmonary effort is normal.     Breath sounds: Normal breath sounds.  Skin:    General: Skin is warm and dry.  Neurological:     Mental Status: He is alert.     Lab Results Lab Results  Component Value Date   WBC 35.2 (H) 04/10/2023   HGB 9.8 (L) 04/10/2023   HCT 29.1 (L) 04/10/2023   MCV 78.0 (L) 04/10/2023   PLT 522 (H) 04/10/2023    Lab Results  Component Value Date   CREATININE 1.89 (H) 04/10/2023   BUN 34 (H) 04/10/2023   NA 129 (L) 04/10/2023   K 3.2 (L) 04/10/2023   CL 96 (L) 04/10/2023  CO2 27 04/10/2023    Lab Results  Component Value Date   ALT 141 (H) 04/09/2023   AST 98 (H) 04/09/2023   ALKPHOS 108 04/09/2023   BILITOT 1.6 (H) 04/09/2023     Microbiology: Recent Results (from the past 240 hour(s))  Culture, blood (Routine X 2) w Reflex to ID Panel     Status: None   Collection Time: 04/02/23  9:26 AM   Specimen: BLOOD  Result Value Ref Range Status   Specimen Description BLOOD BLOOD RIGHT HAND  Final   Special Requests   Final    BOTTLES DRAWN AEROBIC AND ANAEROBIC Blood Culture results may not be optimal due to an inadequate volume of blood received in culture bottles   Culture   Final    NO GROWTH 5 DAYS Performed at [facility], [address] [place], [place] [address]    Report Status 04/07/2023 FINAL  Final  Culture, blood (Routine X 2) w Reflex to ID Panel     Status: None   Collection Time: 04/02/23  9:26 AM   Specimen: BLOOD  Result Value Ref Range Status   Specimen  Description BLOOD BLOOD RIGHT HAND  Final   Special Requests   Final    BOTTLES DRAWN AEROBIC AND ANAEROBIC Blood Culture results may not be optimal due to an inadequate volume of blood received in culture bottles   Culture   Final    NO GROWTH 5 DAYS Performed at [facility], [address], [place], [place] [address]    Report Status 04/07/2023 FINAL  Final     Marcos Eke, NP [facility]  04/10/2023  2:15 PM

## 2023-04-10 NOTE — Progress Notes (Signed)
Met with patient to review current plan of care, rehab schedule, team conference, medications and complications/risks with medical diagnosis. Patient is voiding and having bowel movements regularly with no issues, per patient. Pain is well controlled whenever he receives his oxy. Patient has discharge date set for 12/23 and wife voiced concerns about being able to get into the house and walk to the bathroom and asked writer if she felt this would be doable, Clinical research associate encouraged patient and wife to voice concerns to PT and OT team to give instructions and advice if they thought this would be a doable plan. Continue to follow to address educations needs to facilitate preparation for discharge.

## 2023-04-10 NOTE — Progress Notes (Signed)
Inpatient Rehabilitation Care Coordinator Assessment and Plan Patient Details  Name: [name] [name] MRN: [id number] Date of Birth: [birth year]  Today's Date: 04/10/2023  Hospital Problems: Principal Problem:   Debility Active Problems:   Leukocytosis  Past Medical History:  Past Medical History:  Diagnosis Date   AICD (automatic cardioverter/defibrillator) present    Arthritis    Bladder cancer (HCC)    Borderline glaucoma    Chronic systolic (congestive) heart failure (HCC)    Coronary artery disease    Diabetes mellitus type 2, controlled (HCC) ORAL MED   Essential hypertension    Heart murmur    Hepatitis    Medical history non-contributory    Paroxysmal A-fib (HCC)    Paroxysmal A-fib (HCC) 08/20/2020   Peripheral vascular disease (HCC)    Pneumonia    Past Surgical History:  Past Surgical History:  Procedure Laterality Date   CARDIOVERSION N/A 08/10/2021   Procedure: CARDIOVERSION;  Surgeon: Dolores Patty, MD;  Location: [facility] ENDOSCOPY;  Service: Cardiovascular;  Laterality: N/A;   CARDIOVERSION N/A 11/09/2022   Procedure: CARDIOVERSION;  Surgeon: Maisie Fus, MD;  Location: [facility] INVASIVE CV LAB;  Service: Cardiovascular;  Laterality: N/A;   CARDIOVERSION N/A 03/07/2023   Procedure: CARDIOVERSION (CATH LAB);  Surgeon: Tessa Lerner, DO;  Location: [facility] INVASIVE CV LAB;  Service: Cardiovascular;  Laterality: N/A;   CAROTID DUPLEX SCAN  07-26-2010   BILATERAL ICA  STENOSIS 1% - 39%   CCM GENERATOR INSERTION N/A 01/04/2023   Procedure: CCM GENERATOR INSERTION;  Surgeon: Duke Salvia, MD;  Location: [facility] INVASIVE CV LAB;  Service: Cardiovascular;  Laterality: N/A;   CHOLECYSTECTOMY N/A 08/22/2020   Procedure: LAPAROSCOPIC CHOLECYSTECTOMY;  Surgeon: Emelia Loron, MD;  Location: [facility] OR;  Service: General;  Laterality: N/A;   CORONARY ARTERY BYPASS GRAFT N/A 04/08/2017   Procedure: CORONARY ARTERY BYPASS GRAFTING (CABG) TIMES TWO USING LEFT INTERNAL MAMMARY ARTERY AND  LEFT SAPHENOUS LEG VEIN HARVESTED ENDOSCOPICALLY.  LEG VEIN ALSO HARVESTED FROM THE RIGHT LEG;  Surgeon: Alleen Borne, MD;  Location: [facility] OR;  Service: Open Heart Surgery;  Laterality: N/A;   CYSTOSCOPY WITH BIOPSY N/A 08/01/2012   Procedure: CYSTOSCOPY WITH BIOPSY BLADDER BIOPSY   ;  Surgeon: Antony Haste, MD;  Location: [facility];  Service: Urology;  Laterality: N/A;   ERCP N/A 08/21/2020   Procedure: ENDOSCOPIC RETROGRADE CHOLANGIOPANCREATOGRAPHY (ERCP);  Surgeon: Jeani Hawking, MD;  Location: [facility] ENDOSCOPY;  Service: Endoscopy;  Laterality: N/A;   FULGURATION OF BLADDER TUMOR N/A 08/01/2012   Procedure: FULGURATION OF BLADDER TUMOR;  Surgeon: Antony Haste, MD;  Location: [facility];  Service: Urology;  Laterality: N/A;   ICD IMPLANT  11/20/2017   ICD IMPLANT N/A 11/20/2017   Procedure: ICD IMPLANT;  Surgeon: Duke Salvia, MD;  Location: [facility] INVASIVE CV LAB;  Service: Cardiovascular;  Laterality: N/A;   IR FLUORO GUIDE CV LINE RIGHT  04/01/2023   IR US GUIDE VASC ACCESS RIGHT  04/01/2023   LEFT HEART CATH AND CORONARY ANGIOGRAPHY N/A 03/31/2017   Procedure: LEFT HEART CATH AND CORONARY ANGIOGRAPHY;  Surgeon: Marykay Lex, MD;  Location: [facility] INVASIVE CV LAB;  Service: Cardiovascular;  Laterality: N/A;   LUMBAR LAMINECTOMY/DECOMPRESSION MICRODISCECTOMY Bilateral 06/16/2019   Procedure: Bilateral Lumbar Four-Five Laminectomy and Foraminotomy;  Surgeon: Julio Sicks, MD;  Location: [facility] OR;  Service: Neurosurgery;  Laterality: Bilateral;  posterior   REMOVAL OF STONES  08/21/2020   Procedure: REMOVAL OF STONES;  Surgeon: Jeani Hawking, MD;  Location: [facility] ENDOSCOPY;  Service: Endoscopy;;   RIGHT HEART CATH Right 03/31/2017   Procedure: RIGHT HEART CATH;  Surgeon: Marykay Lex, MD;  Location: [facility] INVASIVE CV LAB;  Service: Cardiovascular;  Laterality: Right;   SPHINCTEROTOMY  08/21/2020   Procedure: SPHINCTEROTOMY;  Surgeon: Jeani Hawking, MD;   Location: [facility] ENDOSCOPY;  Service: Endoscopy;;   TEE WITHOUT CARDIOVERSION N/A 04/08/2017   Procedure: TRANSESOPHAGEAL ECHOCARDIOGRAM (TEE);  Surgeon: Alleen Borne, MD;  Location: [facility] OR;  Service: Open Heart Surgery;  Laterality: N/A;   TRANSESOPHAGEAL ECHOCARDIOGRAM (CATH LAB) N/A 03/07/2023   Procedure: TRANSESOPHAGEAL ECHOCARDIOGRAM;  Surgeon: Tessa Lerner, DO;  [facility] INVASIVE CV LAB;  Service: Cardiovascular;  Laterality: N/A;   TRANSURETHRAL RESECTION OF BLADDER TUMOR  05/29/2011   Procedure: TRANSURETHRAL RESECTION OF BLADDER TUMOR (TURBT);  Surgeon: Antony Haste, MD;  Location: [facility];  Service: Urology;  Laterality: N/A;   VENTRICULAR ASSIST DEVICE INSERTION Right 03/31/2017   Procedure: VENTRICULAR ASSIST DEVICE INSERTION;  Surgeon: Marykay Lex, MD;  Location: [facility] INVASIVE CV LAB;  Service: Cardiovascular;  Laterality: Right;   Social History:  reports that he quit smoking about 6 years ago. His smoking use included cigarettes. He started smoking about 46 years ago. He has a 40 pack-year smoking history. He has never used smokeless tobacco. He reports that he does not currently use alcohol. He reports that he does not use drugs.  Family / Support Systems Marital Status: Married Patient Roles: Spouse Spouse/Significant Other: Lupita Leash (wife) Other Supports: PRN- dtr and SIL Anticipated Caregiver: Wife Ability/Limitations of Caregiver: Pt wife will be primary caregiver. Assistance from dtr and SIL if needed for care needs. Caregiver Availability: 24/7  Social History Preferred language: English Religion: None Health Literacy - How often do you need to have someone help you when you read instructions, pamphlets, or other written material from your doctor or pharmacy?: Never Writes: Yes Employment Status: Retired   Abuse/Neglect Abuse/Neglect Assessment Can Be Completed: Yes Physical Abuse: Denies Verbal Abuse: Denies Sexual Abuse:  Denies Exploitation of patient/patient's resources: Denies Self-Neglect: Denies  Patient response to: Social Isolation - How often do you feel lonely or isolated from those around you?: Never  Emotional Status    Patient / Family Perceptions, Expectations & Goals    Building surveyor: None Premorbid Home Care/DME Agencies: None Transportation available at discharge: Pt wife Is the patient able to respond to transportation needs?: Yes In the past 12 months, has lack of transportation kept you from medical appointments or from getting medications?: No In the past 12 months, has lack of transportation kept you from meetings, work, or from getting things needed for daily living?: No Resource referrals recommended: Neuropsychology  Discharge Planning Living Arrangements: Spouse/significant other Support Systems: Spouse/significant other, Children, Other relatives Type of Residence: Private residence Insurance Resources: Electrical engineer Resources: Restaurant manager, fast food Screen Referred: No Living Expenses: Banker Management: Patient, Spouse Does the patient have any problems obtaining your medications?: Yes (Describe) Home Management: Pt and wife managed home care needs Patient/Family Preliminary Plans: TBD Care Coordinator Barriers to Discharge: Lack of/limited family support, Decreased caregiver support Care Coordinator Anticipated Follow Up Needs: HH/OP Expected length of stay: D/c 12/23  Clinical Impression Chart review to complete assessment. Will add additional information once available.   Renel Ende A Velinda Wrobel 04/10/2023, 2:07 PM

## 2023-04-10 NOTE — Evaluation (Signed)
Speech Language Pathology Assessment and Plan  Patient Details  Name: [name] [name] MRN: [id number] Date of Birth: Feb 08, 1946  SLP Diagnosis:   n/a Rehab Potential:  n/a ELOS:   n/a  Today's Date: 04/10/2023 SLP Individual Time: 1000-1040 SLP Individual Time Calculation (min): 40 min  Hospital Problem: Principal Problem:   Debility Active Problems:   Leukocytosis  Past Medical History:  Past Medical History:  Diagnosis Date   AICD (automatic cardioverter/defibrillator) present    Arthritis    Bladder cancer (HCC)    Borderline glaucoma    Chronic systolic (congestive) heart failure (HCC)    Coronary artery disease    Diabetes mellitus type 2, controlled (HCC) ORAL MED   Essential hypertension    Heart murmur    Hepatitis    Medical history non-contributory    Paroxysmal A-fib (HCC)    Paroxysmal A-fib (HCC) 08/20/2020   Peripheral vascular disease (HCC)    Pneumonia    Past Surgical History:  Past Surgical History:  Procedure Laterality Date   CARDIOVERSION N/A 08/10/2021   Procedure: CARDIOVERSION;  Surgeon: Dolores Patty, MD;  Location: [facility] ENDOSCOPY;  Service: Cardiovascular;  Laterality: N/A;   CARDIOVERSION N/A 11/09/2022   Procedure: CARDIOVERSION;  Surgeon: Maisie Fus, MD;  [facility] INVASIVE CV LAB;  Service: Cardiovascular;  Laterality: N/A;   CARDIOVERSION N/A 03/07/2023   Procedure: CARDIOVERSION (CATH LAB);  Surgeon: Tessa Lerner, DO;  [facility]: [facility] INVASIVE CV LAB;  Service: Cardiovascular;  Laterality: N/A;   CAROTID DUPLEX SCAN  07-26-2010   BILATERAL ICA  STENOSIS 1% - 39%   CCM GENERATOR INSERTION N/A 01/04/2023   Procedure: CCM GENERATOR INSERTION;  Surgeon: Duke Salvia, MD;  [facility] INVASIVE CV LAB;  Service: Cardiovascular;  Laterality: N/A;   CHOLECYSTECTOMY N/A 08/22/2020   Procedure: LAPAROSCOPIC CHOLECYSTECTOMY;  Surgeon: Emelia Loron, MD;  Location: [facility] OR;  Service: General;  Laterality: N/A;   CORONARY ARTERY BYPASS  GRAFT N/A 04/08/2017   Procedure: CORONARY ARTERY BYPASS GRAFTING (CABG) TIMES TWO USING LEFT INTERNAL MAMMARY ARTERY AND LEFT SAPHENOUS LEG VEIN HARVESTED ENDOSCOPICALLY.  LEG VEIN ALSO HARVESTED FROM THE RIGHT LEG;  Surgeon: Alleen Borne, MD;  [facility] OR;  Service: Open Heart Surgery;  Laterality: N/A;   CYSTOSCOPY WITH BIOPSY N/A 08/01/2012   Procedure: CYSTOSCOPY WITH BIOPSY BLADDER BIOPSY   ;  Surgeon: Antony Haste, MD;  Location: [facility];  Service: Urology;  Laterality: N/A;   ERCP N/A 08/21/2020   Procedure: ENDOSCOPIC RETROGRADE CHOLANGIOPANCREATOGRAPHY (ERCP);  Surgeon: Jeani Hawking, MD;  Location: [facility] ENDOSCOPY;  Service: Endoscopy;  Laterality: N/A;   FULGURATION OF BLADDER TUMOR N/A 08/01/2012   Procedure: FULGURATION OF BLADDER TUMOR;  Surgeon: Antony Haste, MD;  Location: [facility];  Service: Urology;  Laterality: N/A;   ICD IMPLANT  11/20/2017   ICD IMPLANT N/A 11/20/2017   Procedure: ICD IMPLANT;  Surgeon: Duke Salvia, MD;  Location: [facility] INVASIVE CV LAB;  Service: Cardiovascular;  Laterality: N/A;   IR FLUORO GUIDE CV LINE RIGHT  04/01/2023   IR US GUIDE VASC ACCESS RIGHT  04/01/2023   LEFT HEART CATH AND CORONARY ANGIOGRAPHY N/A 03/31/2017   Procedure: LEFT HEART CATH AND CORONARY ANGIOGRAPHY;  Surgeon: Marykay Lex, MD;  Location: [facility] INVASIVE CV LAB;  Service: Cardiovascular;  Laterality: N/A;   LUMBAR LAMINECTOMY/DECOMPRESSION MICRODISCECTOMY Bilateral 06/16/2019   Procedure: Bilateral Lumbar Four-Five Laminectomy and Foraminotomy;  Surgeon: Julio Sicks, MD;  Location: [facility] OR;  Service: Neurosurgery;  Laterality: Bilateral;  posterior   REMOVAL OF STONES  08/21/2020   Procedure: REMOVAL OF STONES;  Surgeon: Jeani Hawking, MD;  Location: [facility] ENDOSCOPY;  Service: Endoscopy;;   RIGHT HEART CATH Right 03/31/2017   Procedure: RIGHT HEART CATH;  Surgeon: Marykay Lex, MD;  Location: [facility] INVASIVE CV LAB;  Service:  Cardiovascular;  Laterality: Right;   SPHINCTEROTOMY  08/21/2020   Procedure: SPHINCTEROTOMY;  Surgeon: Jeani Hawking, MD;  Location: [facility] ENDOSCOPY;  Service: Endoscopy;;   TEE WITHOUT CARDIOVERSION N/A 04/08/2017   Procedure: TRANSESOPHAGEAL ECHOCARDIOGRAM (TEE);  Surgeon: Alleen Borne, MD;  Location: [facility] OR;  Service: Open Heart Surgery;  Laterality: N/A;   TRANSESOPHAGEAL ECHOCARDIOGRAM (CATH LAB) N/A 03/07/2023   Procedure: TRANSESOPHAGEAL ECHOCARDIOGRAM;  Surgeon: Tessa Lerner, DO;  Location: [facility] INVASIVE CV LAB;  Service: Cardiovascular;  Laterality: N/A;   TRANSURETHRAL RESECTION OF BLADDER TUMOR  05/29/2011   Procedure: TRANSURETHRAL RESECTION OF BLADDER TUMOR (TURBT);  Surgeon: Antony Haste, MD;  Location: [facility];  Service: Urology;  Laterality: N/A;   VENTRICULAR ASSIST DEVICE INSERTION Right 03/31/2017   Procedure: VENTRICULAR ASSIST DEVICE INSERTION;  Surgeon: Marykay Lex, MD;  Location: [facility] INVASIVE CV LAB;  Service: Cardiovascular;  Laterality: Right;    Assessment / Plan / Recommendation Clinical Impression Pt is a 77 year old male with medical hx significant for: HTN, CAD, paroxysmal A-fib/flutter, chronic systolic CHF, s/p CCM/PM, PVD, DM II, bladder CA, CKD stage IIIb, right distal clavicle fx.  Pt presented to [facility] on 03/25/23 d/t right hip pain. Pt recently admitted to hospital 11/4-11/8 after a fall resulting in right distal clavicle fx. X-rays of hip negative for fracture or dislocation at that time. Pt reported hip pain increased and and was having pain with weightbearing since returning home.  Hip x-ray negative for acute abnormalities. CT hip revealed retroperitoneal hematoma. CT abdomen/pelvis showed intramuscular hemorrhaging extending into iliacus muscle and iliopsoas on right. CT chest suggestive of PNA. Pt placed on 2L of O2 via nasal cannula. Cardiology consulted d/t acute on chronic systolic CHF. Started IV Lasix. Nephrology  consulted d/t creatinine of 3.29.No HD indicated at this time. Pt with RLE swelling 04/08/23, venous duplex ordered.  Therapy evaluations completed and CIR recommended d/t pt's deficits in functional mobility.    Patient presents with cognition that is considered within functional limits for his age range. Administered CLQT+ with patient scoring WFL in the domains of memory, attention, executive function, visuospatial skills, language, and clock drawing. Throughout testing, SLP did notice several instances of impaired complex self-monitoring and complex self-correction which is likely different from baseline given cognitive load of patient's previous profession and likelihood of above average intelligence/cognitive function. Discussed these results with patient who demonstrates awareness of changes to focus/self-monitoring skills and attributes these changes to pain and current medications. Discussed with patient the converse relationship between pain and cognition with patient verbalizing understanding. Given patient's [facility] scores and reports of feeling at baseline level of function, no SLP needs indicated at this time. SLP will sign off.    Skilled Therapeutic Interventions          SLP conducted skilled evaluation session to assess cognitive linguistic function via CLQT+ and patient interview with full results above.    SLP Assessment  Patient does not need any further Speech Lanaguage Pathology Services    Recommendations  Oral Care Recommendations: Oral care BID Recommendations for Other Services: Neuropsych consult Patient destination: Home Follow up Recommendations: None  Equipment Recommended: None recommended by SLP    SLP Frequency   N/a  SLP Duration  SLP Intensity  SLP Treatment/Interventions  N/a   N/a    N/a   Pain Pain Assessment Pain Scale: 0-10 Pain Score: 5   Prior Functioning Cognitive/Linguistic Baseline: Within functional limits Type of Home: House  Lives With:  Spouse Available Help at Discharge: Family;Available 24 hours/day Education: [employer] Vocation: Retired  Architectural technologist Overall Cognitive Status: Within Functional Limits for tasks assessed Arousal/Alertness: Awake/alert Orientation Level: Oriented X4 Year: 2024 Month: December Day of Week: Correct Memory: Appears intact Awareness: Appears intact Problem Solving: Appears intact Executive Function: Self Monitoring;Self Correcting Self Monitoring: Impaired Self Monitoring Impairment: Functional complex Self Correcting: Appears intact Safety/Judgment: Appears intact Comments: slower processing and disorganized with fear of pain per OT  Comprehension Auditory Comprehension Overall Auditory Comprehension: Appears within functional limits for tasks assessed Expression Expression Primary Mode of Expression: Verbal Verbal Expression Overall Verbal Expression: Appears within functional limits for tasks assessed Oral Motor Oral Motor/Sensory Function Overall Oral Motor/Sensory Function: Within functional limits Motor Speech Overall Motor Speech: Appears within functional limits for tasks assessed  Care Tool Care Tool Cognition Ability to hear (with hearing aid or hearing appliances if normally used Ability to hear (with hearing aid or hearing appliances if normally used): 0. Adequate - no difficulty in normal conservation, social interaction, listening to TV   Expression of Ideas and Wants Expression of Ideas and Wants: 4. Without difficulty (complex and basic) - expresses complex messages without difficulty and with speech that is clear and easy to understand   Understanding Verbal and Non-Verbal Content Understanding Verbal and Non-Verbal Content: 4. Understands (complex and basic) - clear comprehension without cues or repetitions  Memory/Recall Ability Memory/Recall Ability : Current season;That he or she is in a hospital/hospital unit   Recommendations for other  services: Neuropsych  Discharge Criteria: Patient will be discharged from SLP if patient refuses treatment 3 consecutive times without medical reason, if treatment goals not met, if there is a change in medical status, if patient makes no progress towards goals or if patient is discharged from hospital.  The above assessment, treatment plan, treatment alternatives and goals were discussed and mutually agreed upon: by patient  Jeannie Done, M.A., CCC-SLP  Yetta Barre 04/10/2023, 12:27 PM

## 2023-04-10 NOTE — Care Management (Signed)
Inpatient Rehabilitation Center Individual Statement of Services  Patient Name:  [name] [name]  Date:  04/10/2023  Welcome to the Inpatient Rehabilitation Center.  Our goal is to provide you with an individualized program based on your diagnosis and situation, designed to meet your specific needs.  With this comprehensive rehabilitation program, you will be expected to participate in at least 3 hours of rehabilitation therapies Monday-Friday, with modified therapy programming on the weekends.  Your rehabilitation program will include the following services:  Physical Therapy (PT), Occupational Therapy (OT), 24 hour per day rehabilitation nursing, Therapeutic Recreaction (TR), Psychology, Neuropsychology, Care Coordinator, Rehabilitation Medicine, Nutrition Services, Pharmacy Services, and Other  Weekly team conferences will be held on Tuesdays to discuss your progress.  Your Inpatient Rehabilitation Care Coordinator will talk with you frequently to get your input and to update you on team discussions.  Team conferences with you and your family in attendance may also be held.  Expected length of stay: 2 weeks    Overall anticipated outcome: Supervision  Depending on your progress and recovery, your program may change. Your Inpatient Rehabilitation Care Coordinator will coordinate services and will keep you informed of any changes. Your Inpatient Rehabilitation Care Coordinator's name and contact numbers are listed  below.  The following services may also be recommended but are not provided by the Inpatient Rehabilitation Center:  Driving Evaluations Home Health Rehabiltiation Services Outpatient Rehabilitation Services Vocational Rehabilitation   Arrangements will be made to provide these services after discharge if needed.  Arrangements include referral to agencies that provide these services.  Your insurance has been verified to be:  Medicare A/B  Your primary doctor is:  Benedetto Goad  Pertinent information will be shared with your doctor and your insurance company.  Inpatient Rehabilitation Care Coordinator:  Susie Cassette [phone] or ([phone]  Information discussed with and copy given to patient by: Gretchen Short, 04/10/2023, 10:28 AM

## 2023-04-10 NOTE — Progress Notes (Signed)
PROGRESS NOTE   Subjective/Complaints:  No events overnight. Patient remains with RLE pain severe overnight.  Per nursing, unstageable wound on his buttocks and right heel.  CBC mildly improved to 35 this AM. NA 129 however patient was noncompliant with fluid restriction yesterday. I&Os remain very +200-470.  LBM 12/10, small.   ROS: Right lower extremity lateral thigh burning pain -ongoing.   denies fevers, chills, N/V, abdominal pain, constipation, diarrhea, SOB, cough, chest pain, new weakness or paraesthesias.    Objective:   DG CHEST PORT 1 VIEW  Result Date: 04/09/2023 CLINICAL DATA:  Leukocytosis. EXAM: PORTABLE CHEST 1 VIEW COMPARISON:  Chest radiograph dated 04/02/2023. FINDINGS: No focal consolidation, pleural effusion, pneumothorax. Mild cardiomegaly. Median sternotomy wires. Cardiac pacemakers noted. Atherosclerotic calcification of the aorta. No acute osseous pathology. IMPRESSION: 1. No active disease. 2. Mild cardiomegaly. Electronically Signed   By: Elgie Collard M.D.   On: 04/09/2023 17:41   VAS [place] LOWER EXTREMITY VENOUS (DVT)  Result Date: 04/09/2023  Lower Venous DVT Study Patient Name:  [name] [name]  Date of Exam:   04/08/2023 Medical Rec #: [id number]         Accession #:    [id number] Date of Birth: 01-08-1946         Patient Gender: M Patient Age:   77 years Exam Location:  [facility] Procedure:      VAS [place] LOWER EXTREMITY VENOUS (DVT) Referring Phys: PARDEEP KHATRI --------------------------------------------------------------------------------  Indications: Swelling, and Edema.  Comparison Study: Previous study on 11.26.2024. Performing Technologist: Fernande Bras  Examination Guidelines: A complete evaluation includes B-mode imaging, spectral Doppler, color Doppler, and power Doppler as needed of all accessible portions of each vessel. Bilateral testing is considered an integral part of a  complete examination. Limited examinations for reoccurring indications may be performed as noted. The reflux portion of the exam is performed with the patient in reverse Trendelenburg.  +---------+---------------+---------+-----------+----------+--------------+ RIGHT    CompressibilityPhasicitySpontaneityPropertiesThrombus Aging +---------+---------------+---------+-----------+----------+--------------+ CFV      Full           Yes      Yes                                 +---------+---------------+---------+-----------+----------+--------------+ SFJ      Full                                                        +---------+---------------+---------+-----------+----------+--------------+ FV Prox  Full                                                        +---------+---------------+---------+-----------+----------+--------------+ FV Mid   Full                                                        +---------+---------------+---------+-----------+----------+--------------+  FV DistalFull                                                        +---------+---------------+---------+-----------+----------+--------------+ PFV      Full                                                        +---------+---------------+---------+-----------+----------+--------------+ POP      Full           Yes      Yes                                 +---------+---------------+---------+-----------+----------+--------------+ PTV      Full                                                        +---------+---------------+---------+-----------+----------+--------------+ PERO     Full                                                        +---------+---------------+---------+-----------+----------+--------------+   +----+---------------+---------+-----------+----------+--------------+ LEFTCompressibilityPhasicitySpontaneityPropertiesThrombus Aging  +----+---------------+---------+-----------+----------+--------------+ CFV Full           Yes      Yes                                 +----+---------------+---------+-----------+----------+--------------+     *See table(s) above for measurements and observations. Electronically signed by Carolynn Sayers on 04/09/2023 at 7:24:06 AM.    Final    Recent Labs    04/09/23 0801 04/10/23 0603  WBC 43.7* 35.2*  HGB 10.9* 9.8*  HCT 31.9* 29.1*  PLT 584* 522*   Recent Labs    04/09/23 0801 04/10/23 0603  NA 131* 129*  K 4.0 3.2*  CL 94* 96*  CO2 26 27  GLUCOSE 230* 136*  BUN 37* 34*  CREATININE 1.93* 1.89*  CALCIUM 7.8* 7.3*    Intake/Output Summary (Last 24 hours) at 04/10/2023 0807 Last data filed at 04/10/2023 0700 Gross per 24 hour  Intake 473 ml  Output 200 ml  Net 273 ml        Physical Exam: Vital Signs Blood pressure 118/67, pulse 93, temperature 99.4 F (37.4 C), resp. rate 16, height 6\' 2"  (1.88 m), weight 89.8 kg, SpO2 98%.   PE: Constitution: Appropriate appearance for age. No apparent distress.  Sitting upright in therapy gym. Resp: No respiratory distress. No accessory muscle usage. on RA and CTAB Cardio: Well perfused appearance.  3+ pitting edema in the right lower extremity; 2+ left lower extremity Abdomen: Nondistended. Nontender.  Nondistended.  Positive bowel sounds. Psych: Appropriate mood and affect. Skin:  - skin rash along right posterior trunk essentially resolved. Mepilex dressing to right  heel, sacral heart to low back for DTI's, unstageable   Neuro: AAOx4. No apparent cognitive deficits.  Follow all simple commands and complex problem-solving.  No dysarthria.  Neurologic Exam:   DTRs: Reflexes were 2+ in bilateral achilles, patella, biceps, BR and triceps. Babinsky: flexor responses b/l.   Hoffmans: negative b/l Sensory exam: revealed normal sensation in all dermatomal regions in bilateral upper extremities, bilateral lower extremities,  and with reduced sensation to light touch in right anterior lateral thigh -ongoing Motor exam: strength 5/5 throughout bilateral upper extremities, left lower extremity, and with exception of right lower extremity 4 out of 5 throughout limited by pain -unchanged Coordination: Fine motor coordination was normal.             Assessment/Plan: 1. Functional deficits which require 3+ hours per day of interdisciplinary therapy in a comprehensive inpatient rehab setting. Physiatrist is providing close team supervision and 24 hour management of active medical problems listed below. Physiatrist and rehab team continue to assess barriers to discharge/monitor patient progress toward functional and medical goals  Care Tool:  Bathing    Body parts bathed by patient: Right arm, Left arm, Chest, Face, Abdomen   Body parts bathed by helper: Front perineal area, Right lower leg, Left upper leg, Right upper leg, Buttocks, Left lower leg     Bathing assist Assist Level: Total Assistance - Patient < 25%     Upper Body Dressing/Undressing Upper body dressing   What is the patient wearing?: Pull over shirt    Upper body assist Assist Level: Contact Guard/Touching assist    Lower Body Dressing/Undressing Lower body dressing      What is the patient wearing?: Pants     Lower body assist Assist for lower body dressing: Total Assistance - Patient < 25%     Toileting Toileting    Toileting assist Assist for toileting: Maximal Assistance - Patient 25 - 49%     Transfers Chair/bed transfer  Transfers assist     Chair/bed transfer assist level: 2 Helpers     Locomotion Ambulation   Ambulation assist   Ambulation activity did not occur: Safety/medical concerns (amb 8' in //bars with min assist and +2 for WC follow)          Walk 10 feet activity   Assist  Walk 10 feet activity did not occur: Safety/medical concerns        Walk 50 feet activity   Assist Walk 50 feet  with 2 turns activity did not occur: Safety/medical concerns         Walk 150 feet activity   Assist Walk 150 feet activity did not occur: Safety/medical concerns         Walk 10 feet on uneven surface  activity   Assist Walk 10 feet on uneven surfaces activity did not occur: Safety/medical concerns         Wheelchair     Assist Is the patient using a wheelchair?: Yes Type of Wheelchair: Manual    Wheelchair assist level: Contact Guard/Touching assist Max wheelchair distance: 89'    Wheelchair 50 feet with 2 turns activity    Assist        Assist Level: Contact Guard/Touching assist   Wheelchair 150 feet activity     Assist      Assist Level: Moderate Assistance - Patient 50 - 74%   Blood pressure 118/67, pulse 93, temperature 99.4 F (37.4 C), resp. rate 16, height 6\' 2"  (1.88 m), weight 89.8 kg, SpO2  98%.  Medical Problem List and Plan: 1. Functional deficits secondary to retroperitoneal hematoma after recent fall.             -also right right AC jt injury, ?clavicle fx             -patient may  shower  -Stable to continue CIR             -ELOS/Goals: 10-12 days, supervision goals with PT, OT - 12/23 DC date   - 12/10: Family installing chair lift. Has HH in place. Functionally limited by RLE pain.    2.  Antithrombotics: -DVT/anticoagulation:  Pharmaceutical: Eliquis             -antiplatelet therapy: none             -pt with new right lower extremity edema and thigh pain. Dopplers pending                         -eliquis recently resumed, so findings won't change medical mgt 3. Pain Management: Tylenol, oxycodone as needed for hip              -right thigh pain. ?muscle strain. No sensory findings. Most recent CT reviewed.  It's possible he's had further migration of blood further down into the thigh as there was blood along the distal iliopsoas on that CT                         -add prn robaxin                         -order kpad                           -dopplers pending as above   - 12/10: Per OT Max 2 OOB due to leg pain.  On exam,?  Meralgia paresthetica based on distribution, will add gabapentin 100 mg 3 times daily due to renal deficiency.  Increase oxycodone to 5 to 10 mg as needed.  Not scheduled Tylenol due to elevated LFTs.  4. Mood/Behavior/Sleep: LCSW to evaluate and provide emotional support             -continue melatonin 10 mg              -antipsychotic agents: n/a   5. Neuropsych/cognition: This patient is capable of making decisions on his own behalf.    - 12/10: SLP consult for reported cognitive deficits; may be medication induced per wife  6. Skin/Wound Care: Routine skin care checks   - Stage 2 to sacrum reported-now unstageable  12-11: Unstageable found on right heel.  Added Medihoney to heel and low back, changed to low-air-loss mattress.   - Lateral trunk rash managed with steroid creams essentially resolved   7. Fluids/Electrolytes/Nutrition/hyponatremia: Strict Is and Os and follow-up chemistries   - 12/10: Fluid restriction for mild hyponatremia today.  Repeat tomorrow a.m.  - 12/11: Na 129 today; was not compliant with fluid restriction yesterday. Continue today and repeat in AM tomorrow; if no improvement will need to start gentle IVF. Add 40 meq daily potassium.   8: Hypertension: monitor TID and prn (Entresto, torsemide held)             -currently on midodrine 10 mg TID for hypotension      04/10/2023    6:34 AM 04/10/2023  5:10 AM 04/09/2023    8:22 PM  Vitals with BMI  Weight 198 lbs    BMI 25.41    Systolic  118 125  Diastolic  67 72  Pulse  93 90    9:  Cardiorenal syndrome/acute kidney Injury on chronic kidney disease stage IIIb (baseline creatinine around 2)              -milrinone discontinued 12/08             -no indication for HD             -daily weight             -continue sodium bicarbonate 650 mg BID             -continue torsemide 20 mg  daily  12/10: Renal function appears stable.     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  I's and O's remain significantly positive.  Added TED hose for bilateral lower extremity edema; weights remain stable, no symptoms of volume overload. Filed Weights   04/08/23 1745 04/10/23 0634  Weight: 89.4 kg 89.8 kg      16: Elevated transaminases, up trending: ? due to congestive hepatopathy             -follow-up CMP-downtrending, but persistent   17: Multiple falls; AC joint injury/possible right distal clavicular fracture 11/05   18: Possible pneumonia: continue Augmentin to finish the course   - 12/10:  Repeat chest x-ray without consolidation  - 12/11: Augmentin stopped per ID recommendations  19: Leukocytosis, chronic: negative infectious work-up except possible PNA; had short course steroids for skin rash             -appears to be off prednisone now             -on Augmentin (end 12/11)             -follow-up CBC with diff on 12/10  12/10: Uptrending leukocytosis 28.3 -> 37.8 -> 43.7 despite stopping p.o. steroids on 12-7.  Extensive inpatient workup for leukocytosis described in H&P.  Does have chronic underlying leukocytosis in the 20s.  Will consult infectious disease for additional  workup/ideas today, feel less likely reactive given that thrombocytosis is improving and other parameters remained stable.   12/11: Mildly decreased to 35 today. Remains afebrile. ID consulted yesterday, recommending DC augmentin, daily  monitorring, and drainage of psoas fluid collection if no improvement. Appreciate their insight and recommendations.   20: Lung nodule: follow-up outpatient with PCP   21: Hx of bladder cancer s/p TURP 2013   22: Skin rash treated with prednisone/hydroxyzine/calamine lotion             -appears prednisone has been discontinued-rash essentially resolved.   23: Chronic anemia: H and H stable times 72 hours             -follow-up CBC-stable   24: Thrombocytosis; fluctuating: follow-up CBC.  Improving.    LOS: 2 days A FACE TO FACE EVALUATION WAS PERFORMED  Angelina Sheriff 04/10/2023, 8:07 AM

## 2023-04-10 NOTE — Progress Notes (Signed)
No ICM remote transmission received for 04/08/2023 due to currently hospitalized and next ICM transmission scheduled for 05/06/2023.

## 2023-04-10 NOTE — Progress Notes (Signed)
Patient ID: [name] [name], male   DOB: 1945/08/12, 77 y.o.   MRN: [id number]  SW went by pt room, and pt not present as in therapy. SW will follow-up to complete assessment. SW completed chart review. Additional information will be added once available.   Cecile Sheerer, MSW, LCSW Office: [phone] Cell: [phone] Fax: [phone]

## 2023-04-10 NOTE — Progress Notes (Signed)
Physical Therapy Session Note  Patient Details  Name: [name] [name] MRN: [id number] Date of Birth: January 07, 1946  Today's Date: 04/10/2023 PT Individual Time: 0905-0950 PT Individual Time Calculation (min): 45 min   Short Term Goals: Week 1:  PT Short Term Goal 1 (Week 1): Pt will complete sit<>stand with min assist PT Short Term Goal 2 (Week 1): Pt will complete bed mobility with min assist with LRAD PT Short Term Goal 3 (Week 1): Pt will ambulate 48' with min assist and LRAD PT Short Term Goal 4 (Week 1): Pt will complete up/down 1 step with BHRs mod assist  Skilled Therapeutic Interventions/Progress Updates: Pt presented in bed agreeable to therapy, states pain 7/10, premedicated with rest and repositioning provided as needed. Pt agreeable to OOB and donning clothes. Performed gentle AROM/AAROM prior to bed mobility. Performed ankle pumps, QS, AA heel slides, and AA Abd x 10. Pt then completed supine to sit with heavy modA, use of bed features and increased time. At EOB pt noted to have posterior bias requiring modA for correction multiple times. PTA threaded pants and donned shoes total A with pt able to maintain sitting balance. Pt then donned shirt with set up but noted x 2 LOB posteriorly requiring assist from PTA for correction. Bed was then elevated and pt completed sit to stand with modA with pt requiring cues for increased anterior weight shifting. Pt completed stand step transfer to w/c with RW and ModA. Pt then transported to day room for time management with pt participated in Cybex Kinetron 20cm/sec 1 min on/1 min off for 5 cycles. Pt was able to intermittently increase range (pt requesting PTA to increase foot plate height). Pt indicated no significant increase in pain with activity. Pt transported back to room and pt requesting to return to bed as has SLP eval immediately followed by OT session. Pt completed sit to stand from w/c with heavy modA and completed transfer back to EOB. Pt  required modA for sit to supine with PTA managing BLE and pt able to control trunk with use of bed rails. In bed pt's head lowered and pt was able to boost self up to [facility] using LLE and pulling on bed rails. Pt repositioned to comfort and left with bed alarm on, call bell within reach and needs met.      Therapy Documentation Precautions:  Precautions Precautions: Fall Precaution Comments: watch BP and HR Restrictions Weight Bearing Restrictions: No RUE Weight Bearing: Weight bearing as tolerated General:   Vital Signs:   Pain: Pain Assessment Pain Scale: 0-10 Pain Score: 5     Therapy/Group: Individual Therapy  Indie Boehne 04/10/2023, 12:58 PM

## 2023-04-10 NOTE — Progress Notes (Signed)
Occupational Therapy Session Note  Patient Details  Name: ANASS CARNEGIE MRN: [id number] Date of Birth: 07/02/1945  Today's Date: 04/10/2023 OT Individual Time: 1345-1430 OT Individual Time Calculation (min): 45 min    Short Term Goals: Week 1:  OT Short Term Goal 1 (Week 1): LB dressing with AE and min A OT Short Term Goal 2 (Week 1): Standing 2 min sink side with LRAD for grooming with min A OT Short Term Goal 3 (Week 1): Toilet/stall shower transfer with LRAD and min A OT Short Term Goal 4 (Week 1): UB self care seated sinkside with set up  Skilled Therapeutic Interventions/Progress Updates:    Pt received supine with 6/10 pain in his R hip, agreeable to OT session. Repositioning used for pain intervention. He came to EOB with use of a leg lifter initially and then mod A to elevate trunk and manage RLE. He stood from very elevated EOB with min A. Stand pivot with the RW with min A to the w/c. Pt was taken via w/c to the therapy gym for time management. He completed 25 ft of functional mobility with the RW with antalgic gait, about min-mod A provided. He required an extended rest break for pain and endurance. He completed stand pivot to the mat with min A. He completed 3x10 tricep push ups for carryover to ADL transfers and increased UE reliance. He returned to the w/c, min A with the RW. He transferred to supine in bed with mod A and was left with all needs met, bed alarm set.    Therapy Documentation Precautions:  Precautions Precautions: Fall Precaution Comments: watch BP and HR Restrictions Weight Bearing Restrictions: No RUE Weight Bearing: Weight bearing as tolerated  Therapy/Group: Individual Therapy  [name] 04/10/2023, 6:12 AM

## 2023-04-11 ENCOUNTER — Inpatient Hospital Stay (HOSPITAL_COMMUNITY): Payer: Medicare Other

## 2023-04-11 DIAGNOSIS — R5381 Other malaise: Secondary | ICD-10-CM | POA: Diagnosis not present

## 2023-04-11 LAB — BASIC METABOLIC PANEL
Anion gap: 9 (ref 5–15)
BUN: 37 mg/dL — ABNORMAL HIGH (ref 8–23)
CO2: 26 mmol/L (ref 22–32)
Calcium: 7.6 mg/dL — ABNORMAL LOW (ref 8.9–10.3)
Chloride: 96 mmol/L — ABNORMAL LOW (ref 98–111)
Creatinine, Ser: 1.95 mg/dL — ABNORMAL HIGH (ref 0.61–1.24)
GFR, Estimated: 35 mL/min — ABNORMAL LOW (ref >60)
Glucose, Bld: 134 mg/dL — ABNORMAL HIGH (ref 70–99)
Potassium: 4.1 mmol/L (ref 3.5–5.1)
Sodium: 131 mmol/L — ABNORMAL LOW (ref 135–145)

## 2023-04-11 LAB — CBC WITH DIFFERENTIAL/PLATELET
Abs Immature Granulocytes: 0.79 10*3/uL — ABNORMAL HIGH (ref 0.00–0.07)
Basophils Absolute: 0.1 10*3/uL (ref 0.0–0.1)
Basophils Relative: 0 %
Eosinophils Absolute: 0 10*3/uL (ref 0.0–0.5)
Eosinophils Relative: 0 %
HCT: 31.2 % — ABNORMAL LOW (ref 39.0–52.0)
Hemoglobin: 10.5 g/dL — ABNORMAL LOW (ref 13.0–17.0)
Immature Granulocytes: 2 %
Lymphocytes Relative: 3 %
Lymphs Abs: 1.1 10*3/uL (ref 0.7–4.0)
MCH: 26.7 pg (ref 26.0–34.0)
MCHC: 33.7 g/dL (ref 30.0–36.0)
MCV: 79.4 fL — ABNORMAL LOW (ref 80.0–100.0)
Monocytes Absolute: 1.7 10*3/uL — ABNORMAL HIGH (ref 0.1–1.0)
Monocytes Relative: 4 %
Neutro Abs: 36.9 10*3/uL — ABNORMAL HIGH (ref 1.7–7.7)
Neutrophils Relative %: 91 %
Platelets: 570 10*3/uL — ABNORMAL HIGH (ref 150–400)
RBC: 3.93 MIL/uL — ABNORMAL LOW (ref 4.22–5.81)
RDW: 18.5 % — ABNORMAL HIGH (ref 11.5–15.5)
WBC: 40.6 10*3/uL — ABNORMAL HIGH (ref 4.0–10.5)
nRBC: 0 % (ref 0.0–0.2)

## 2023-04-11 LAB — GLUCOSE, CAPILLARY
Glucose-Capillary: 116 mg/dL — ABNORMAL HIGH (ref 70–99)
Glucose-Capillary: 263 mg/dL — ABNORMAL HIGH (ref 70–99)
Glucose-Capillary: 254 mg/dL — ABNORMAL HIGH (ref 70–99)
Glucose-Capillary: 231 mg/dL — ABNORMAL HIGH (ref 70–99)
Glucose-Capillary: 196 mg/dL — ABNORMAL HIGH (ref 70–99)

## 2023-04-11 MED ORDER — MECLIZINE HCL 25 MG PO TABS: 12.5000 mg | ORAL_TABLET | Freq: Two times a day (BID) | ORAL | Status: DC | PRN

## 2023-04-11 MED ORDER — OXYCODONE HCL 5 MG PO TABS
5.0000 mg | ORAL_TABLET | Freq: Four times a day (QID) | ORAL | Status: DC | PRN
Start: 2023-04-11 — End: 2023-04-18
  Administered 2023-04-12 – 2023-04-18 (×12): 5 mg via ORAL
  Filled 2023-04-11 (×14): qty 1

## 2023-04-11 MED ORDER — LIDOCAINE HCL (PF) 1 % IJ SOLN
7.0000 mL | Freq: Once | INTRAMUSCULAR | Status: AC
  Administered 2023-04-11: 7 mL via INTRADERMAL

## 2023-04-11 NOTE — Plan of Care (Signed)
Patient alert/oriented X4. Patient compliant with medication administration and tolerated aspiration procedure of R hip. Patient complains of no pain. VSS, will continue to monitor.   Problem: Consults Goal: RH GENERAL PATIENT EDUCATION Description: See Patient Education module for education specifics. Outcome: Progressing   Problem: Consults Goal: Skin Care Protocol Initiated - if Braden Score 18 or less Description: If consults are not indicated, leave blank or document N/A Outcome: Progressing   Problem: Consults Goal: Nutrition Consult-if indicated Outcome: Progressing   Problem: Consults Goal: Diabetes Guidelines if Diabetic/Glucose > 140 Description: If diabetic or lab glucose is > 140 mg/dl - Initiate Diabetes/Hyperglycemia Guidelines & Document Interventions  Outcome: Progressing   Problem: RH BOWEL ELIMINATION Goal: RH STG MANAGE BOWEL WITH ASSISTANCE Description: STG Manage Bowel with mod I Assistance. Outcome: Progressing   Problem: RH SKIN INTEGRITY Goal: RH STG SKIN FREE OF INFECTION/BREAKDOWN Description: Manage w min assist Outcome: Progressing   Problem: RH SAFETY Goal: RH STG ADHERE TO SAFETY PRECAUTIONS W/ASSISTANCE/DEVICE Description: STG Adhere to Safety Precautions With cues Assistance/Device. Outcome: Progressing   Problem: RH PAIN MANAGEMENT Goal: RH STG PAIN MANAGED AT OR BELOW PT'S PAIN GOAL Description: < 4 with prns Outcome: Progressing

## 2023-04-11 NOTE — Procedures (Signed)
Interventional Radiology Procedure Note  Procedure:   US guided aspiration of right hip fluid.   Findings:  30cc of turbid brownish fluid  Complications: None Recommendations:  - Routine wound care - Do not submerge for 7 days - follow up culture  Signed,  Yvone Neu. Loreta Ave, DO

## 2023-04-11 NOTE — Progress Notes (Signed)
Physical Therapy Session Note  Patient Details  Name: [name] [name] MRN: [id number] Date of Birth: 1946-03-12  Today's Date: 04/11/2023 PT Individual Time: 0806-0901 + 8413-2440 PT Individual Time Calculation (min): 55 min + 59 min  Short Term Goals: Week 1:  PT Short Term Goal 1 (Week 1): Pt will complete sit<>stand with min assist PT Short Term Goal 2 (Week 1): Pt will complete bed mobility with min assist with LRAD PT Short Term Goal 3 (Week 1): Pt will ambulate 76' with min assist and LRAD PT Short Term Goal 4 (Week 1): Pt will complete up/down 1 step with BHRs mod assist  Skilled Therapeutic Interventions/Progress Updates:   SESSION 1:  Pt presents in room in bed, agreeable to PT. Pt denies pain at rest however with vocal outbursts of pain with mobility and all movement of RLE. Session focused on therapeutic activities to include bed mobility training, tolerance to upright as well as therapeutic exercise to promote tolerance to weightbearing as well as BLE strengthening needed for functional transfers and ambulation.  Pt completes bed mobility mod assist with leg lifter and max cues, requires min assist for sitting balance with posterior LOB, increased difficulty with anterior wt shift. Pt completes stand pivot transfer with RW from elevated bed height mod assist, mod assist stand pivot with posterior lean noted.  Pt transported to gym via WC dependently for time management. Pt completes interval training on kinetron 12 min at 30 cm/sec, increased difficulty noted this session compared to previous session with pt demonstrating decreased endurance and reporting increased pain in RLE with mobility.  Pt then completes seated therex for core/BLE strengthening needed for functional transfers, positioning, and ambulation including: - sit ups x10 - reaching towards toes x10 - heel slides x15 - LAQs x10 AAROM - hip flexion x10 AAROM  Pt returns to room and remains seated in [facility] with all  needs within reach, cal light in place and chair alarm donned and activated at end of session.   SESSION 2: Pt presents in room in bed, agreeable to PT. Pt denies pain in supine at rest at start of session but reporting pain with all RLE movements and mobility. Session focused on gait training and therapeutic activities for transfer training.  Pt provided with thigh high TED hose for RLE to decrease RLE edema with dependent positioning. Pt completes bed mobility with mod assist with max verbal cues for sequencing task, uses leg lifter to manage RLE off bed requires assist for trunk to upright. Pt then completes sit<>stand with mod assist to RW, stand pivot to [facility] with min assist with RW.  Pt trnasposrted from room to day room dependently for time management. Pt completes ambulation 22' with RW mod assist and 2nd person WC follow with pt demonstrating increasing cadence, decreased proximity to RW and RLE buckling with fatigue towards end of ambulation requiring quick sit to WC. Pt requires extended seated rest break following gait trial due to fatigue and SOB that subsides with rest. Pt then ambulates short distance 5' from [facility] to RW, cues for stepping into RW with RLE to allow asisst for BUEs. Upon sitting on EOM, pt reporting dizziness. MD then present to discuss procedure with pt, completes vestibular screen, pt demonstrating decreasing postural stability while seated EOM requiring CGA for sitting balance. Pt completes stand step transfer from EOM to [facility] with mod assist and increased time to complete secondary to fatigue, cues for RW mgmt and increasing proximity to RW.  Pt transported  back to room dependently for time management, pt reporting dizziness subsiding but requesting to return to bed. Pt completes stand step transfer with RW mod assist, cues for positioning prior to sitting EOB. Pt completes sit to supine with significantly increased time, uses leg lifter for RLE mgmt however continues to require  max assist for BLEs into bed. Pt requires assist for repositioning in supine and remains supine with all needs within reach, call light in place, and bed alarm activated at end of session. Pt wife at bedside at end of session.   Therapy Documentation Precautions:  Precautions Precautions: Fall Precaution Comments: watch BP and HR Restrictions Weight Bearing Restrictions Per Provider Order: No RUE Weight Bearing Per Provider Order: Weight bearing as tolerated    Therapy/Group: Individual Therapy  Edwin Cap PT, DPT 04/11/2023, 9:06 AM

## 2023-04-11 NOTE — Progress Notes (Signed)
Occupational Therapy Session Note  Patient Details  Name: [name] [name] MRN: [id number] Date of Birth: 03-06-1946  Today's Date: 04/11/2023 OT Individual Time:  - Missed 75 min due to pt down for procedure for R hip fluid aspiration, will make up missed minutes as able       Short Term Goals: Week 1:  OT Short Term Goal 1 (Week 1): LB dressing with AE and min A OT Short Term Goal 2 (Week 1): Standing 2 min sink side with LRAD for grooming with min A OT Short Term Goal 3 (Week 1): Toilet/stall shower transfer with LRAD and min A OT Short Term Goal 4 (Week 1): UB self care seated sinkside with set up  Skilled Therapeutic Interventions/Progress Updates:   Vicenta Dunning 04/11/2023, 2:14 PM

## 2023-04-11 NOTE — Progress Notes (Signed)
PROGRESS NOTE   Subjective/Complaints:  No events overnight.  On exam, patient working in the gym with PT, started experiencing dizziness/vertigo and had just asked to sit down.  Symptoms are slowly improving, he states he has never had this before, denies any association with pain, decreased energy, or anxiety surrounding upcoming IR procedure.  Right lower extremity pain ongoing but appears to be improving somewhat, scoring 6-8 overnight instead of 10 out of 10.  Vitals appear stable.  No fevers overnight.  Labs this a.m. significant for WBC back up to 40, and thrombocytosis back up slightly.  Renal function appears stable.  NA slightly improved.  ROS: Right lower extremity lateral thigh burning pain -ongoing. +vertigo - new.   denies fevers, chills, N/V, abdominal pain, constipation, diarrhea, SOB, cough, chest pain, new weakness or paraesthesias.    Objective:   DG CHEST PORT 1 VIEW Result Date: 04/09/2023 CLINICAL DATA:  Leukocytosis. EXAM: PORTABLE CHEST 1 VIEW COMPARISON:  Chest radiograph dated 04/02/2023. FINDINGS: No focal consolidation, pleural effusion, pneumothorax. Mild cardiomegaly. Median sternotomy wires. Cardiac pacemakers noted. Atherosclerotic calcification of the aorta. No acute osseous pathology. IMPRESSION: 1. No active disease. 2. Mild cardiomegaly. Electronically Signed   By: Elgie Collard M.D.   On: 04/09/2023 17:41   Recent Labs    04/10/23 0603 04/11/23 0629  WBC 35.2* 40.6*  HGB 9.8* 10.5*  HCT 29.1* 31.2*  PLT 522* 570*   Recent Labs    04/10/23 0603 04/11/23 0629  NA 129* 131*  K 3.2* 4.1  CL 96* 96*  CO2 27 26  GLUCOSE 136* 134*  BUN 34* 37*  CREATININE 1.89* 1.95*  CALCIUM 7.3* 7.6*   No intake or output data in the 24 hours ending 04/11/23 1033    Pressure Injury 04/08/23 Ankle Posterior;Right Stage 2 -  Partial thickness loss of dermis presenting as a shallow open injury with a  red, pink wound bed without slough. unattached edges scant drainage. This LDA replaces one from 03/03/23 that was entered (Active)  04/08/23 1730  Location: Ankle  Location Orientation: Posterior;Right  Staging: Stage 2 -  Partial thickness loss of dermis presenting as a shallow open injury with a red, pink wound bed without slough.  Wound Description (Comments): unattached edges scant drainage. This LDA replaces one from 03/03/23 that was entered inaccurately as a blister that had opened.  Present on Admission: Yes    Physical Exam: Vital Signs Blood pressure (!) 118/58, pulse 94, temperature 98 F (36.7 C), temperature source Oral, resp. rate 17, height 6\' 2"  (1.88 m), weight 89.8 kg, SpO2 94%.   PE: Constitution: Appropriate appearance for age. No apparent distress.  Sitting upright in therapy gym. HEENT: No horizontal or vertical nystagmus on EOMI.  No difficulty with tracking.  PERRLA. Resp: No respiratory distress. No accessory muscle usage. on RA and CTAB Cardio: Well perfused appearance.  2+ pitting edema in the right lower extremity w/ thigh high TED; 1+ left lower extremity Abdomen: Nondistended. Nontender.  Nondistended.  Positive bowel sounds. Psych: Appropriate mood and affect.  Mildly anxious. Skin:  - skin rash along right posterior trunk essentially resolved. Mepilex dressing to right heel, sacral heart to low  back for DTI's, unstageable   Neuro: AAOx4. No apparent cognitive deficits.  Follow all simple commands and complex problem-solving.  No dysarthria.  Neurologic Exam:   DTRs: Reflexes were 2+ in bilateral achilles, patella, biceps, BR and triceps. Babinsky: flexor responses b/l.   Hoffmans: negative b/l Sensory exam: revealed normal sensation in all dermatomal regions in bilateral upper extremities, bilateral lower extremities, and with reduced sensation to light touch in right anterior lateral thigh -ongoing Motor exam: Antigravity and against resistance in all  4 extremities, ambulating with min assist with rolling walker Coordination: Fine motor coordination was normal.             Assessment/Plan: 1. Functional deficits which require 3+ hours per day of interdisciplinary therapy in a comprehensive inpatient rehab setting. Physiatrist is providing close team supervision and 24 hour management of active medical problems listed below. Physiatrist and rehab team continue to assess barriers to discharge/monitor patient progress toward functional and medical goals  Care Tool:  Bathing    Body parts bathed by patient: Right arm, Left arm, Chest, Face, Abdomen   Body parts bathed by helper: Front perineal area, Right lower leg, Left upper leg, Right upper leg, Buttocks, Left lower leg     Bathing assist Assist Level: Total Assistance - Patient < 25%     Upper Body Dressing/Undressing Upper body dressing   What is the patient wearing?: Pull over shirt    Upper body assist Assist Level: Contact Guard/Touching assist    Lower Body Dressing/Undressing Lower body dressing      What is the patient wearing?: Pants     Lower body assist Assist for lower body dressing: Total Assistance - Patient < 25%     Toileting Toileting    Toileting assist Assist for toileting: Maximal Assistance - Patient 25 - 49%     Transfers Chair/bed transfer  Transfers assist  Chair/bed transfer activity did not occur: Safety/medical concerns (unsafe to get up)  Chair/bed transfer assist level: 2 Helpers     Locomotion Ambulation   Ambulation assist   Ambulation activity did not occur: Safety/medical concerns (amb 8' in //bars with min assist and +2 for WC follow)          Walk 10 feet activity   Assist  Walk 10 feet activity did not occur: Safety/medical concerns        Walk 50 feet activity   Assist Walk 50 feet with 2 turns activity did not occur: Safety/medical concerns         Walk 150 feet activity   Assist Walk 150  feet activity did not occur: Safety/medical concerns         Walk 10 feet on uneven surface  activity   Assist Walk 10 feet on uneven surfaces activity did not occur: Safety/medical concerns         Wheelchair     Assist Is the patient using a wheelchair?: Yes Type of Wheelchair: Manual    Wheelchair assist level: Contact Guard/Touching assist Max wheelchair distance: 24'    Wheelchair 50 feet with 2 turns activity    Assist        Assist Level: Contact Guard/Touching assist   Wheelchair 150 feet activity     Assist      Assist Level: Moderate Assistance - Patient 50 - 74%   Blood pressure (!) 118/58, pulse 94, temperature 98 F (36.7 C), temperature source Oral, resp. rate 17, height 6\' 2"  (1.88 m), weight 89.8 kg, SpO2 94%.  Medical Problem List and Plan: 1. Functional deficits secondary to retroperitoneal hematoma after recent fall.             -also right right AC jt injury, ?clavicle fx             -patient may  shower  -Stable to continue CIR             -ELOS/Goals: 10-12 days, supervision goals with PT, OT - 12/23 DC date   - 12/10: Family installing chair lift. Has HH in place. Functionally limited by RLE pain.    2.  Antithrombotics: -DVT/anticoagulation:  Pharmaceutical: Eliquis             -antiplatelet therapy: none             -pt with new right lower extremity edema and thigh pain. Dopplers pending                         -eliquis recently resumed, so findings won't change medical mgt 3. Pain Management: Tylenol, oxycodone as needed for hip              -right thigh pain. ?muscle strain. No sensory findings. Most recent CT reviewed.  It's possible he's had further migration of blood further down into the thigh as there was blood along the distal iliopsoas on that CT                         -add prn robaxin                         -order kpad                          -dopplers pending as above   - 12/10: Per OT Max 2 OOB due to leg  pain.  On exam,?  Meralgia paresthetica based on distribution, will add gabapentin 100 mg 3 times daily due to renal deficiency.  Increase oxycodone to 5 to 10 mg as needed.  Not scheduled Tylenol due to elevated LFTs.  -12/12: Episode of dizziness a.m., had taken a.m. oxycodone several hours prior.  Decrease as needed to 5 mg every 6 hours as needed.  DC gabapentin.  4. Mood/Behavior/Sleep: LCSW to evaluate and provide emotional support             -continue melatonin 10 mg              -antipsychotic agents: n/a   5. Neuropsych/cognition: This patient is capable of making decisions on his own behalf.    - 12/10: SLP consult for reported cognitive deficits; may be medication induced per wife  6. Skin/Wound Care: Routine skin care checks   - Stage 2 to sacrum reported-now unstageable  12-11: Unstageable found on right heel.  Added Medihoney to heel and low back, changed to low-air-loss mattress - WOCN consulted 12/11 for recs   - Lateral trunk rash managed with steroid creams essentially resolved   7. Fluids/Electrolytes/Nutrition/hyponatremia: Strict Is and Os and follow-up chemistries   - 12/10: Fluid restriction for mild hyponatremia today.  Repeat tomorrow a.m.  - 12/11: Na 129 today; was not compliant with fluid restriction yesterday. Continue today and repeat in AM tomorrow; if no improvement will need to start gentle IVF. Add 40 meq daily potassium.   12-12: NA 131, improved.  Potassium normalized.  DC supplementation  of potassium given all priors were normal on same dose of torsemide.  8: Hypertension: monitor TID and prn (Entresto, torsemide held)             -currently on midodrine 10 mg TID for hypotension   -12-12: Slightly hypotensive, on fluid restriction with diuresis, would continue midodrine for now.  Questionable orthostatic episode today, symptoms more vertiginous, treatment as below.     04/11/2023    6:01 AM 04/10/2023    7:25 PM 04/10/2023    1:18 PM  Vitals with  BMI  Systolic 118 95 126  Diastolic 58 70 57  Pulse 94 89 92    9:  Cardiorenal syndrome/acute kidney Injury on chronic kidney disease stage IIIb (baseline creatinine around 2)              -milrinone discontinued 12/08             -no indication for HD             -daily weight             -continue sodium bicarbonate 650 mg BID             -continue torsemide 20 mg daily  12/10-12: Renal function appears stable.     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  I's and O's remain significantly positive.  Added TED hose for bilateral lower extremity edema; weights remain stable, no symptoms of volume overload.  12/12: Weights stable. Filed Weights   04/08/23 1745 04/10/23 0634  Weight: 89.4 kg 89.8 kg      16: Elevated transaminases, up trending: ? due to congestive hepatopathy             -follow-up CMP-downtrending, but persistent   17: Multiple falls; AC joint injury/possible right distal clavicular fracture 11/05   18: Possible pneumonia: continue Augmentin to finish the course   - 12/10:  Repeat chest x-ray without consolidation  - 12/11: Augmentin stopped per ID recommendations  19: Leukocytosis, chronic: negative infectious work-up except possible PNA; had short course steroids for  skin rash             -appears to be off prednisone now             -on Augmentin (end 12/11)             -follow-up CBC with diff  on 12/10  12/10: Uptrending leukocytosis 28.3 -> 37.8 -> 43.7 despite stopping p.o. steroids on 12-7.  Extensive inpatient workup for leukocytosis described in H&P.  Does have chronic underlying leukocytosis in the 20s.  Will consult infectious disease for additional workup/ideas today, feel less likely reactive given that thrombocytosis is improving and other parameters remained stable.   12/11: Mildly decreased to 35 today. Remains afebrile. ID consulted yesterday, recommending DC augmentin, daily monitorring, and drainage of psoas fluid collection if no improvement. Appreciate their insight and recommendations.  12-12: WBC back up to 40, thrombocytosis mildly uptrending.  Afebrile, per infectious disease aspiration of right hip fluid performed and cultures pending.  Appreciate their recommendations and management.  20: Lung nodule: follow-up outpatient with PCP   21: Hx of bladder cancer s/p TURP 2013   22: Skin rash treated with prednisone/hydroxyzine/calamine lotion             -appears prednisone has been discontinued-rash essentially resolved.   23: Chronic anemia: H and H stable times 72 hours             -follow-up CBC-stable   24: Thrombocytosis; fluctuating: follow-up CBC.  Ongoing..   25. Vertigo/dizziness.  Started 12-12, no nystagmus on exam. -orthostatic vitals every morning -Added as needed meclizine 12.5 mg twice daily -Possible symptoms are secondary to medication burden, would wean down pain medications as tolerated  LOS: 3 days A FACE TO FACE EVALUATION WAS PERFORMED  Angelina Sheriff 04/11/2023, 10:33 AM

## 2023-04-11 NOTE — Progress Notes (Signed)
[facility]  Date of Admission:  04/08/2023     Total days of antibiotics 12         ASSESSMENT:  Mr. Routh slightly worsening leukocytosis with WBC count increasing to 40.6 along with increased neturophilia on differential concerning for possibility of infection despite stopping steroids and removal of central venous catheter. Discussed recommendation to have IR evaluate for possible aspiration of right hip to ensure there is no infection present. Clinically stable and no indication to restart antibiotics which may help with any yields for culture. Continue watchful waiting with additional recommendations pending IR findings/results. Remaining medical and supportive care per Primary Team.    PLAN:  Continue to monitor off antibiotics.  Consult IR for evaluation and possible aspiration or right hip fluid collection with concern for infection Remaining medical and supportive care per Primary Team.   Principal Problem:   Debility Active Problems:   Leukocytosis    amiodarone  200 mg Oral Daily   apixaban  5 mg Oral BID   gabapentin  100 mg Oral TID   insulin aspart  0-15 Units Subcutaneous TID WC   insulin aspart  0-5 Units Subcutaneous QHS   midodrine  10 mg Oral TID WC   senna-docusate  1 tablet Oral BID   sodium bicarbonate  650 mg Oral BID   torsemide  20 mg Oral Daily    SUBJECTIVE:  Afebrile overnight with increased leukocytosis. Having some hip discomfort after working with Physical Therapy. Wife at bedside.   Allergies  Allergen Reactions   Spironolactone Other (See Comments)    Painful gynecomastia   Semaglutide     Nausea and bloating   Tramadol Other (See Comments)    Sleep disturbance.  Apnea spells.   Empagliflozin Rash    Rash at the groin Jardiance   Niacin And Related Hives     Review of Systems: Review of Systems  Constitutional:  Negative for chills, fever and weight loss.  Respiratory:  Negative for cough,  shortness of breath and wheezing.   Cardiovascular:  Negative for chest pain and leg swelling.  Gastrointestinal:  Negative for abdominal pain, constipation, diarrhea, nausea and vomiting.  Musculoskeletal:        Hip discomfort.   Skin:  Negative for rash.      OBJECTIVE: Vitals:   04/10/23 0634 04/10/23 1318 04/10/23 1925 04/11/23 0601  BP:  (!) 126/57 95/70 (!) 118/58  Pulse:  92 89 94  Resp:  16 18 17   Temp:  98.3 F (36.8 C) 98.4 F (36.9 C) 98 F (36.7 C)  TempSrc:  Oral Oral Oral  SpO2:  94% 95% 94%  Weight: 89.8 kg     Height:       Body mass index is 25.42 kg/m.  Physical Exam Constitutional:      General: He is not in acute distress.    Appearance: He is well-developed.  Cardiovascular:     Rate and Rhythm: Normal rate and regular rhythm.     Heart sounds: Normal heart sounds.  Pulmonary:     Effort: Pulmonary effort is normal.     Breath sounds: Normal breath sounds.  Skin:    General: Skin is warm and dry.  Neurological:     Mental Status: He is alert.  Psychiatric:        Mood and Affect: Mood normal.     Lab Results Lab Results  Component Value Date   WBC 40.6 (H) 04/11/2023  HGB 10.5 (L) 04/11/2023   HCT 31.2 (L) 04/11/2023   MCV 79.4 (L) 04/11/2023   PLT 570 (H) 04/11/2023    Lab Results  Component Value Date   CREATININE 1.95 (H) 04/11/2023   BUN 37 (H) 04/11/2023   NA 131 (L) 04/11/2023   K 4.1 04/11/2023   CL 96 (L) 04/11/2023   CO2 26 04/11/2023    Lab Results  Component Value Date   ALT 141 (H) 04/09/2023   AST 98 (H) 04/09/2023   ALKPHOS 108 04/09/2023   BILITOT 1.6 (H) 04/09/2023     Microbiology: Recent Results (from the past 240 hours)  Culture, blood (Routine X 2) w Reflex to ID Panel     Status: None   Collection Time: 04/02/23  9:26 AM   Specimen: BLOOD  Result Value Ref Range Status   Specimen Description BLOOD BLOOD RIGHT HAND  Final   Special Requests   Final    BOTTLES DRAWN AEROBIC AND ANAEROBIC Blood  Culture results may not be optimal due to an inadequate volume of blood received in culture bottles   Culture   Final    NO GROWTH 5 DAYS Performed at [facility], [address], [place], [place] [address]    Report Status 04/07/2023 FINAL  Final  Culture, blood (Routine X 2) w Reflex to ID Panel     Status: None   Collection Time: 04/02/23  9:26 AM   Specimen: BLOOD  Result Value Ref Range Status   Specimen Description BLOOD BLOOD RIGHT HAND  Final   Special Requests   Final    BOTTLES DRAWN AEROBIC AND ANAEROBIC Blood Culture results may not be optimal due to an inadequate volume of blood received in culture bottles   Culture   Final    NO GROWTH 5 DAYS Performed at [facility], [address], [place], [place] [address]    Report Status 04/07/2023 FINAL  Final     Marcos Eke, NP [facility]  04/11/2023  11:02 AM

## 2023-04-11 NOTE — Consult Note (Addendum)
WOC Nurse Consult Note: Reason for Consult: Requested to assess a sacral wound. Performed remotely after evaluate photo and notes. Wound type: Unstageable sacral wound on the R side. Pressure Injury POA: Yes Measurement: aproxx. 5x5x.1cm Wound bed: 100% yellow  Periwound: dry, intact, signs of friction. Dressing procedure/placement/frequency: Sacrum: Clean with Vashe [id number], apply xeroform (change daily), and foam dressing covering. Change every 3 days of if is saturated or contaminated by stools.  WOC team will not plan to follow further.  Please reconsult if further assistance is needed. Thank-you,  Denyse Amass BSN, RN, [employer], WOC  (Pager: [phone])

## 2023-04-12 DIAGNOSIS — K6812 Psoas muscle abscess: Secondary | ICD-10-CM

## 2023-04-12 DIAGNOSIS — D72829 Elevated white blood cell count, unspecified: Secondary | ICD-10-CM | POA: Diagnosis not present

## 2023-04-12 DIAGNOSIS — R5381 Other malaise: Secondary | ICD-10-CM | POA: Diagnosis not present

## 2023-04-12 LAB — CBC WITH DIFFERENTIAL/PLATELET
Abs Immature Granulocytes: 0.73 10*3/uL — ABNORMAL HIGH (ref 0.00–0.07)
Basophils Absolute: 0.1 10*3/uL (ref 0.0–0.1)
Basophils Relative: 0 %
Eosinophils Absolute: 0 10*3/uL (ref 0.0–0.5)
Eosinophils Relative: 0 %
HCT: 29.7 % — ABNORMAL LOW (ref 39.0–52.0)
Hemoglobin: 10.1 g/dL — ABNORMAL LOW (ref 13.0–17.0)
Immature Granulocytes: 2 %
Lymphocytes Relative: 4 %
Lymphs Abs: 1.4 10*3/uL (ref 0.7–4.0)
MCH: 26.4 pg (ref 26.0–34.0)
MCHC: 34 g/dL (ref 30.0–36.0)
MCV: 77.7 fL — ABNORMAL LOW (ref 80.0–100.0)
Monocytes Absolute: 1.6 10*3/uL — ABNORMAL HIGH (ref 0.1–1.0)
Monocytes Relative: 5 %
Neutro Abs: 30.1 10*3/uL — ABNORMAL HIGH (ref 1.7–7.7)
Neutrophils Relative %: 89 %
Platelets: 569 10*3/uL — ABNORMAL HIGH (ref 150–400)
RBC: 3.82 MIL/uL — ABNORMAL LOW (ref 4.22–5.81)
RDW: 18.6 % — ABNORMAL HIGH (ref 11.5–15.5)
WBC: 33.9 10*3/uL — ABNORMAL HIGH (ref 4.0–10.5)
nRBC: 0 % (ref 0.0–0.2)

## 2023-04-12 LAB — BASIC METABOLIC PANEL
Anion gap: 8 (ref 5–15)
BUN: 37 mg/dL — ABNORMAL HIGH (ref 8–23)
CO2: 25 mmol/L (ref 22–32)
Calcium: 7.6 mg/dL — ABNORMAL LOW (ref 8.9–10.3)
Chloride: 97 mmol/L — ABNORMAL LOW (ref 98–111)
Creatinine, Ser: 1.84 mg/dL — ABNORMAL HIGH (ref 0.61–1.24)
GFR, Estimated: 37 mL/min — ABNORMAL LOW (ref >60)
Glucose, Bld: 139 mg/dL — ABNORMAL HIGH (ref 70–99)
Potassium: 4.2 mmol/L (ref 3.5–5.1)
Sodium: 130 mmol/L — ABNORMAL LOW (ref 135–145)

## 2023-04-12 LAB — GLUCOSE, CAPILLARY
Glucose-Capillary: 127 mg/dL — ABNORMAL HIGH (ref 70–99)
Glucose-Capillary: 244 mg/dL — ABNORMAL HIGH (ref 70–99)
Glucose-Capillary: 162 mg/dL — ABNORMAL HIGH (ref 70–99)
Glucose-Capillary: 241 mg/dL — ABNORMAL HIGH (ref 70–99)

## 2023-04-12 MED ORDER — VANCOMYCIN HCL 1.5 G IV SOLR
1500.0000 mg | Freq: Once | INTRAVENOUS | Status: AC
  Administered 2023-04-12: 1500 mg via INTRAVENOUS
  Filled 2023-04-12: qty 30

## 2023-04-12 MED ORDER — METHOCARBAMOL 750 MG PO TABS
750.0000 mg | ORAL_TABLET | Freq: Three times a day (TID) | ORAL | Status: DC
  Administered 2023-04-12 – 2023-04-17 (×17): 750 mg via ORAL
  Filled 2023-04-12 (×17): qty 1

## 2023-04-12 MED ORDER — VANCOMYCIN HCL IN DEXTROSE 1-5 GM/200ML-% IV SOLN
1000.0000 mg | INTRAVENOUS | Status: DC
  Administered 2023-04-13 – 2023-04-14 (×2): 1000 mg via INTRAVENOUS
  Filled 2023-04-12 (×3): qty 200

## 2023-04-12 NOTE — Progress Notes (Signed)
Physical Therapy Note  Patient Details  Name: [name] [name] MRN: [id number] Date of Birth: 1946-01-16 Today's Date: 04/12/2023  Pt missed 60 minutes of group therapy due to refusal. Pt reporting feeling "anxious" after experiencing extreme LOB in PT session this morning and declining participation in group session as well as OOB mobility for remainder of day. Will attempt to make up missed time as able.     Marlana Salvage Zaunegger Blima Rich PT, DPT 04/12/2023, 12:11 PM

## 2023-04-12 NOTE — Progress Notes (Signed)
Physical Therapy Session Note  Patient Details  Name: [name] [name] MRN: [id number] Date of Birth: 16-Nov-1945  Today's Date: 04/12/2023 PT Individual Time: 0804-0850 PT Individual Time Calculation (min): 46 min   Short Term Goals: Week 1:  PT Short Term Goal 1 (Week 1): Pt will complete sit<>stand with min assist PT Short Term Goal 2 (Week 1): Pt will complete bed mobility with min assist with LRAD PT Short Term Goal 3 (Week 1): Pt will ambulate 22' with min assist and LRAD PT Short Term Goal 4 (Week 1): Pt will complete up/down 1 step with BHRs mod assist   Skilled Therapeutic Interventions/Progress Updates:  Patient supine in bed on entrance to room. Playing solitaire on tablet. Wife present. Patient alert and agreeable to PT session. Is already dressed for day.   RN present and providing morning medications. Patient with 8/10 pain complaint at start of session and pain medication included in morning medicines. Requires max cues to participate.   Therapeutic Activity: Bed Mobility: Pt performed supine > sit with moving BLE to EOB requiring Min A and with visible pain. VC required for pt to initiate all movements as he is expecting full DEP assist for RLE movement in order to guard against potential pain. Extensive cueing and education for pt to initiate throughout. Pt very specific as to needs and makes requests prior to proper time or without actual need.   Is very slowly and with great difficulty able to scoot forward to edge of bed and place feet on floor. VC for technique to move R hip including lean to L side in order to unweight R side.   Education provided re: pain science and harmful vs healing pain as well as pain related to swelling/ edema and infection in iliopsoas musculature.   Transfers: Pt setup for stand pivot to w/c placed to his L side. Provided with RW and guided in sit<>stand from elevated bed height.Educated on Bil foot placement with R foot ahead of L.  Demonstrated need for hip hinge for proper biomechanics for rising to stand.    Is able to reach flexed standing position at RW. Stands for almost and guided in mild lateral weight shift. Pt attempting to take step leading with RLE and knee buckles. Pt guided to sit on therapist's knee. Mod/ maxA +3 required to position pt supine in bed as pt unable to utilize LLE to assist back to bed.   Pt is able to use LLE with vc to assist with move toward HOB once supine. Pt relates pain increased in RLE to 9/10. Pt apologizing for "failing" in therapy session. Educated that pt will be pushed to attempt movements in order to progress. Educated again, not to let pain prevent attempts to mobilize.   Patient supine in bed at end of session with brakes locked, bed alarm set, and all needs within reach.   Therapy Documentation Precautions:  Precautions Precautions: Fall Precaution Comments: watch BP and HR Restrictions Weight Bearing Restrictions Per Provider Order: No RUE Weight Bearing Per Provider Order: Weight bearing as tolerated  Pain: Pt relates 8/10 pain to RN at start of session when receiving pain meds. Then 9/10 after return to bed at end of session. Premedicated but RN notified.   Therapy/Group: Individual Therapy  Loel Dubonnet PT, DPT, CSRS 04/12/2023, 4:56 PM

## 2023-04-12 NOTE — Progress Notes (Signed)
PROGRESS NOTE   Subjective/Complaints:  Patient with ongoing right lower extremity pain today.  Does not feel that it was improved at all after aspiration of 300 cc of fluid yesterday.  Today, culture showing many WBCs and PMNs; final results pending.  He denies any fevers, chills.  Patient initially started on vancomycin per infectious disease, however shortly after starting his arm became red and indurated.  On exam, he denies any itching, warmth, or pain from the site.  Has never had a reaction to medications before.  Does appear that the IV infiltrated.  He denies any further orthostasis today or dizziness/vertigo.  ROS: Right lower extremity lateral thigh burning pain -ongoing. +vertigo -not recurrent today  denies fevers, chills, N/V, abdominal pain, constipation, diarrhea, SOB, cough, chest pain, new weakness or paraesthesias.    Objective:   [place] DRAIN/INJ MAJOR JOINT/BURSA Result Date: 04/12/2023 INDICATION: 77 year old male with fluid collection involving the hip flexors on the right based on prior CT 04/03/2023. He has been referred aspiration for diagnostic purposes. EXAM: ULTRASOUND-GUIDED ASPIRATION HIP FLUID COLLECTION MEDICATIONS: None ANESTHESIA/SEDATION: Moderate (conscious) sedation was not employed during this procedure. COMPLICATIONS: None PROCEDURE: Informed written consent was obtained from the patient after a thorough discussion of the procedural risks, benefits and alternatives. All questions were addressed. Maximal Sterile Barrier Technique was utilized including caps, mask, sterile gowns, sterile gloves, sterile drape, hand hygiene and skin antiseptic. A timeout was performed prior to the initiation of the procedure. Patient was positioned supine.  Ultrasound images were performed. The patient is prepped and draped in the usual sterile fashion. 1% lidocaine was used for local anesthesia. 18 gauge needle was advanced  into the fluid collection without significant aspiration. A Yueh needle was then advanced. We were able to achieve approximately 30 cc of aspiration turbid brownish fluid. This was sent for culture. Catheter was removed and a final image was stored. Sterile bandage was placed. Patient tolerated the procedure well and remained hemodynamically stable throughout. No complications were encountered and no significant blood loss IMPRESSION: Status post ultrasound-guided aspiration of right hip fluid collection involving the hip flexors. Signed, Yvone Neu. Miachel Roux, RPVI [facility] Radiology Electronically Signed   By: Gilmer Mor D.O.   On: 04/12/2023 09:38   Recent Labs    04/11/23 0629 04/12/23 0609  WBC 40.6* 33.9*  HGB 10.5* 10.1*  HCT 31.2* 29.7*  PLT 570* 569*   Recent Labs    04/11/23 0629 04/12/23 0609  NA 131* 130*  K 4.1 4.2  CL 96* 97*  CO2 26 25  GLUCOSE 134* 139*  BUN 37* 37*  CREATININE 1.95* 1.84*  CALCIUM 7.6* 7.6*    Intake/Output Summary (Last 24 hours) at 04/12/2023 1031 Last data filed at 04/12/2023 0740 Gross per 24 hour  Intake 720 ml  Output 750 ml  Net -30 ml      Pressure Injury 04/08/23 Ankle Posterior;Right Stage 2 -  Partial thickness loss of dermis presenting as a shallow open injury with a red, pink wound bed without slough. unattached edges scant drainage. This LDA replaces one from 03/03/23 that was entered (Active)  04/08/23 1730  Location: Ankle  Location Orientation: Posterior;Right  Staging: Stage 2 -  Partial thickness loss of dermis presenting as a shallow open injury with a red, pink wound bed without slough.  Wound Description (Comments): unattached edges scant drainage. This LDA replaces one from 03/03/23 that was entered inaccurately as a blister that had opened.  Present on Admission: Yes    Physical Exam: Vital Signs Blood pressure 119/68, pulse 96, temperature 98.3 F (36.8 C),  temperature source Oral, resp. rate 18, height 6\' 2"  (1.88 m), weight 89.8 kg, SpO2 96%.   PE: Constitution: Appropriate appearance for age. No apparent distress.  Laying in bed. HEENT: NCAT.  EOMI.  PERRLA. Resp: No respiratory distress. No accessory muscle usage. on RA and CTAB Cardio: Well perfused appearance.  2+ pitting edema in the right lower extremity greatest at the thigh; trace left lower extremity Abdomen: Nondistended. Nontender.  Nondistended.  Positive bowel sounds. Psych: Appropriate mood and affect.  Mildly anxious. Skin:  - skin rash along right posterior trunk essentially resolved.  - Mepilex dressing to right heel, sacral heart to low back for DTI's, unstageable -Left antecubital fossa erythema surrounding prior IV site; no warmth, fluctuance, or drainage.  Neuro: AAOx4. No apparent cognitive deficits.   Neurologic Exam:   DTRs: Reflexes were 2+ in bilateral achilles, patella, biceps, BR and triceps. Sensory exam: revealed normal sensation in all dermatomal regions in bilateral upper extremities, bilateral lower extremities, and with reduced sensation to light touch in right anterior lateral thigh -ongoing Motor exam: Antigravity and against resistance in all 4 extremities, lower extremity hip flexion and knee extension limited due to pain  coordination: Fine motor coordination was normal.             Assessment/Plan: 1. Functional deficits which require 3+ hours per day of interdisciplinary therapy in a comprehensive inpatient rehab setting. Physiatrist is providing close team supervision and 24 hour management of active medical problems listed below. Physiatrist and rehab team continue to assess barriers to discharge/monitor patient progress toward functional and medical goals  Care Tool:  Bathing    Body parts bathed by patient: Right arm, Left arm, Chest, Face, Abdomen   Body parts bathed by helper: Front perineal area, Right lower leg, Left upper leg,  Right upper leg, Buttocks, Left lower leg     Bathing assist Assist Level: Total Assistance - Patient < 25%     Upper Body Dressing/Undressing Upper body dressing   What is the patient wearing?: Pull over shirt    Upper body assist Assist Level: Contact Guard/Touching assist    Lower Body Dressing/Undressing Lower body dressing      What is the patient wearing?: Pants     Lower body assist Assist for lower body dressing: Total Assistance - Patient < 25%     Toileting Toileting    Toileting assist Assist for toileting: Maximal Assistance - Patient 25 - 49%     Transfers Chair/bed transfer  Transfers assist  Chair/bed transfer activity did not occur: Safety/medical concerns (unsafe to get up)  Chair/bed transfer assist level: 2 Helpers     Locomotion Ambulation   Ambulation assist   Ambulation activity did not occur: Safety/medical concerns (amb 8' in //bars with min assist and +2 for WC follow)          Walk 10 feet activity   Assist  Walk 10 feet activity did not occur: Safety/medical concerns        Walk 50 feet activity   Assist Walk 50 feet with  2 turns activity did not occur: Safety/medical concerns         Walk 150 feet activity   Assist Walk 150 feet activity did not occur: Safety/medical concerns         Walk 10 feet on uneven surface  activity   Assist Walk 10 feet on uneven surfaces activity did not occur: Safety/medical concerns         Wheelchair     Assist Is the patient using a wheelchair?: Yes Type of Wheelchair: Manual    Wheelchair assist level: Contact Guard/Touching assist Max wheelchair distance: 74'    Wheelchair 50 feet with 2 turns activity    Assist        Assist Level: Contact Guard/Touching assist   Wheelchair 150 feet activity     Assist      Assist Level: Moderate Assistance - Patient 50 - 74%   Blood pressure 119/68, pulse 96, temperature 98.3 F (36.8 C), temperature  source Oral, resp. rate 18, height 6\' 2"  (1.88 m), weight 89.8 kg, SpO2 96%.  Medical Problem List and Plan: 1. Functional deficits secondary to retroperitoneal hematoma after recent fall.             -also right right AC jt injury, ?clavicle fx             -patient may  shower  -Stable to continue CIR             -ELOS/Goals: 10-12 days, supervision goals with PT, OT - 12/23 DC date   - 12/10: Family installing chair lift. Has HH in place. Functionally limited by RLE pain.    2.  Antithrombotics: -DVT/anticoagulation:  Pharmaceutical: Eliquis             -antiplatelet therapy: none             -pt with new right lower extremity edema and thigh pain. Dopplers pending                         -eliquis recently resumed, so findings won't change medical mgt 3. Pain Management: Tylenol, oxycodone as needed for hip              -right thigh pain. ?muscle strain. No sensory findings. Most recent CT reviewed.  It's possible he's had further migration of blood further down into the thigh as there was blood along the distal iliopsoas on that CT                         -add prn robaxin                         -order kpad                          -dopplers pending as above   - 12/10: Per OT Max 2 OOB due to leg pain.  On exam,?  Meralgia paresthetica based on distribution, will add gabapentin 100 mg 3 times daily due to renal deficiency.  Increase oxycodone to 5 to 10 mg as needed.  Not scheduled Tylenol due to elevated LFTs.  -12/12: Episode of dizziness a.m., had taken a.m. oxycodone several hours prior.  Decrease as needed to 5 mg every 6 hours as needed.  DC gabapentin.  12-13: Dizziness not recurrent.  Start Robaxin 750 mg 3 times daily for ongoing 5 pain.  4. Mood/Behavior/Sleep: LCSW to evaluate and provide emotional support             -continue melatonin 10 mg              -antipsychotic agents: n/a   5. Neuropsych/cognition: This patient is capable of making decisions on his own behalf.     - 12/10: SLP consult for reported cognitive deficits; may be medication induced per wife  6. Skin/Wound Care: Routine skin care checks   - Stage 2 to sacrum reported-now unstageable  12-11: Unstageable found on right heel.  Added Medihoney to heel and low back, changed to low-air-loss mattress - WOCN consulted 12/11 for recs   - Lateral trunk rash managed with steroid creams essentially resolved   -12-13: Left antecubital IV infiltration.  Ice and compression to area.  7. Fluids/Electrolytes/Nutrition/hyponatremia: Strict Is and Os and follow-up chemistries   - 12/10: Fluid restriction for mild hyponatremia today.  Repeat tomorrow a.m.  - 12/11: Na 129 today; was not compliant with fluid restriction yesterday. Continue today and repeat in AM tomorrow; if no improvement will need to start gentle IVF. Add 40 meq daily potassium.   12-12: NA 131, improved.  Potassium normalized.  DC supplementation of potassium given all priors were normal on same dose of torsemide.  12-13: NA 130, BUN/creatinine stable.  Monitor   8: Hypertension: monitor TID and prn (Entresto, torsemide held)             -currently on midodrine 10 mg TID for hypotension   -12-12: Slightly hypotensive, on fluid restriction with diuresis, would continue midodrine for now.  Questionable orthostatic episode today, symptoms more vertiginous, treatment as below.  12-13: BP improved, no further symptoms orthostasis, monitor     04/12/2023    6:05 AM 04/11/2023    8:07 PM 04/11/2023   12:00 PM  Vitals with BMI  Systolic 119 112 865  Diastolic 68 57 74  Pulse 96 88     9:  Cardiorenal syndrome/acute kidney Injury on chronic kidney disease stage IIIb (baseline creatinine around 2)              -milrinone discontinued 12/08             -no indication for HD             -daily weight             -continue sodium bicarbonate 650 mg BID             -continue torsemide 20 mg daily  12/10-13: Renal function appears stable.      10: Fall/right retroperitoneal hematoma: now back on Eliquis; H and H stable             -follow-up CBC - stable   11: Atypical atrial flutter; hx of pAF: rate controlled (low 100s)             -on Eliquis, amiodarone   12: COPD: former tobacco smoker, quit 2018   13: DM-2: CBGs QID; A1c = 6.9% (home on Ozempic; metformin discontinued due to CKD)             -continue SSI  Blood sugars have remained relatively stable Recent Labs    04/12/23 0606 04/12/23 1135 04/12/23 1559  GLUCAP 127* 244* 162*       14: CAD: s/p 2-vessel CABG 2018             -restart statin once LFTs normalized             -  follow-up CMP   15: Acute on chronic systolic and diastolic heart failure, 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  I's and O's remain significantly positive.  Added TED hose for bilateral lower extremity edema; weights remain stable, no symptoms of volume overload.  12/12-13: Weights stable. Filed Weights   04/08/23 1745 04/10/23 0634  Weight: 89.4 kg 89.8 kg      16: Elevated transaminases, up trending: ? due to congestive hepatopathy             -follow-up CMP-downtrending, but persistent   17: Multiple falls; AC joint injury/possible right distal clavicular fracture 11/05   18: Possible pneumonia: continue Augmentin to finish the course   - 12/10:  Repeat chest x-ray without consolidation  - 12/11: Augmentin stopped per ID recommendations  19: Leukocytosis, chronic: negative infectious work-up except possible PNA; had short course steroids for skin rash             -appears to be off prednisone now             -on Augmentin (end 12/11)             -follow-up CBC with diff on 12/10  12/10: Uptrending leukocytosis 28.3 -> 37.8 -> 43.7 despite stopping p.o. steroids on 12-7.  Extensive inpatient workup for leukocytosis described  in H&P.  Does have chronic underlying leukocytosis in the 20s.  Will consult infectious disease for additional workup/ideas today, feel less likely reactive given that thrombocytosis is improving and other parameters remained stable.   12/11: Mildly decreased to 35 today. Remains afebrile. ID consulted yesterday, recommending DC augmentin, daily monitorring, and drainage of psoas fluid collection if no improvement. Appreciate their insight and recommendations.  12-12: WBC back up to 40, thrombocytosis mildly uptrending.  Afebrile, per infectious disease aspiration of right hip fluid performed and cultures pending.  Appreciate their recommendations and management.  12-13: 300 cc hip aspirate with multiple WBCs and PMNs.  Patient started on vancomycin per infectious disease.  Monitor for final culture results.  20: Lung nodule: follow-up outpatient with PCP   21: Hx of bladder cancer s/p TURP 2013   22: Skin rash treated with prednisone/hydroxyzine/calamine lotion             -appears prednisone has been discontinued-rash essentially resolved.   23: Chronic anemia: H and H stable times 72 hours             -follow-up CBC-stable   24: Thrombocytosis; fluctuating: follow-up CBC.  Ongoing..   25. Vertigo/dizziness.  Started 12-12, no nystagmus on exam. -orthostatic vitals every morning -Added as needed meclizine 12.5 mg twice daily -Possible symptoms are secondary to medication burden, would wean down pain medications as tolerated -12-13: Improved,  LOS: 4 days A FACE TO FACE EVALUATION WAS PERFORMED  Angelina Sheriff 04/12/2023, 10:31 AM

## 2023-04-12 NOTE — Plan of Care (Signed)
Patient alert/oriented X4. Patient compliant with medication administration and tolerated IV abx. New IV placed in L forearm and was properly covered with insulin administration. Tramadol given as needed for increased pain in R hip. VSS, will continue to monitor.   Problem: Consults Goal: RH GENERAL PATIENT EDUCATION Description: See Patient Education module for education specifics. Outcome: Progressing   Problem: Consults Goal: Skin Care Protocol Initiated - if Braden Score 18 or less Description: If consults are not indicated, leave blank or document N/A Outcome: Progressing   Problem: Consults Goal: Nutrition Consult-if indicated Outcome: Progressing   Problem: Consults Goal: Diabetes Guidelines if Diabetic/Glucose > 140 Description: If diabetic or lab glucose is > 140 mg/dl - Initiate Diabetes/Hyperglycemia Guidelines & Document Interventions  Outcome: Progressing   Problem: RH BOWEL ELIMINATION Goal: RH STG MANAGE BOWEL WITH ASSISTANCE Description: STG Manage Bowel with mod I Assistance. Outcome: Progressing   Problem: RH SKIN INTEGRITY Goal: RH STG SKIN FREE OF INFECTION/BREAKDOWN Description: Manage w min assist Outcome: Progressing   Problem: RH SAFETY Goal: RH STG ADHERE TO SAFETY PRECAUTIONS W/ASSISTANCE/DEVICE Description: STG Adhere to Safety Precautions With cues Assistance/Device. Outcome: Progressing   Problem: RH PAIN MANAGEMENT Goal: RH STG PAIN MANAGED AT OR BELOW PT'S PAIN GOAL Description: < 4 with prns Outcome: Progressing

## 2023-04-12 NOTE — Progress Notes (Signed)
Inpatient Rehabilitation Care Coordinator Assessment and Plan Patient Details  Name: [name] [name] MRN: [id number] Date of Birth: [birth year]  Today's Date: 04/12/2023  Hospital Problems: Principal Problem:   Debility Active Problems:   Leukocytosis  Past Medical History:  Past Medical History:  Diagnosis Date   AICD (automatic cardioverter/defibrillator) present    Arthritis    Bladder cancer (HCC)    Borderline glaucoma    Chronic systolic (congestive) heart failure (HCC)    Coronary artery disease    Diabetes mellitus type 2, controlled (HCC) ORAL MED   Essential hypertension    Heart murmur    Hepatitis    Medical history non-contributory    Paroxysmal A-fib (HCC)    Paroxysmal A-fib (HCC) 08/20/2020   Peripheral vascular disease (HCC)    Pneumonia    Past Surgical History:  Past Surgical History:  Procedure Laterality Date   CARDIOVERSION N/A 08/10/2021   Procedure: CARDIOVERSION;  Surgeon: Dolores Patty, MD;  Location: [facility] ENDOSCOPY;  Service: Cardiovascular;  Laterality: N/A;   CARDIOVERSION N/A 11/09/2022   Procedure: CARDIOVERSION;  Surgeon: Maisie Fus, MD;  Location: [facility] INVASIVE CV LAB;  Service: Cardiovascular;  Laterality: N/A;   CARDIOVERSION N/A 03/07/2023   Procedure: CARDIOVERSION (CATH LAB);  Surgeon: Tessa Lerner, DO;  Location: [facility] INVASIVE CV LAB;  Service: Cardiovascular;  Laterality: N/A;   CAROTID DUPLEX SCAN  07-26-2010   BILATERAL ICA  STENOSIS 1% - 39%   CCM GENERATOR INSERTION N/A 01/04/2023   Procedure: CCM GENERATOR INSERTION;  Surgeon: Duke Salvia, MD;  Location: [facility] INVASIVE CV LAB;  Service: Cardiovascular;  Laterality: N/A;   CHOLECYSTECTOMY N/A 08/22/2020   Procedure: LAPAROSCOPIC CHOLECYSTECTOMY;  Surgeon: Emelia Loron, MD;  Location: [facility] OR;  Service: General;  Laterality: N/A;   CORONARY ARTERY BYPASS GRAFT N/A 04/08/2017   Procedure: CORONARY ARTERY BYPASS GRAFTING (CABG) TIMES TWO USING LEFT INTERNAL MAMMARY ARTERY AND  LEFT SAPHENOUS LEG VEIN HARVESTED ENDOSCOPICALLY.  LEG VEIN ALSO HARVESTED FROM THE RIGHT LEG;  Surgeon: Alleen Borne, MD;  Location: [facility] OR;  Service: Open Heart Surgery;  Laterality: N/A;   CYSTOSCOPY WITH BIOPSY N/A 08/01/2012   Procedure: CYSTOSCOPY WITH BIOPSY BLADDER BIOPSY   ;  Surgeon: Antony Haste, MD;  Location: [facility];  Service: Urology;  Laterality: N/A;   ERCP N/A 08/21/2020   Procedure: ENDOSCOPIC RETROGRADE CHOLANGIOPANCREATOGRAPHY (ERCP);  Surgeon: Jeani Hawking, MD;  Location: [facility] ENDOSCOPY;  Service: Endoscopy;  Laterality: N/A;   FULGURATION OF BLADDER TUMOR N/A 08/01/2012   Procedure: FULGURATION OF BLADDER TUMOR;  Surgeon: Antony Haste, MD;  Location: [facility];  Service: Urology;  Laterality: N/A;   ICD IMPLANT  11/20/2017   ICD IMPLANT N/A 11/20/2017   Procedure: ICD IMPLANT;  Surgeon: Duke Salvia, MD;  Location: [facility] INVASIVE CV LAB;  Service: Cardiovascular;  Laterality: N/A;   IR FLUORO GUIDE CV LINE RIGHT  04/01/2023   IR US GUIDE VASC ACCESS RIGHT  04/01/2023   LEFT HEART CATH AND CORONARY ANGIOGRAPHY N/A 03/31/2017   Procedure: LEFT HEART CATH AND CORONARY ANGIOGRAPHY;  Surgeon: Marykay Lex, MD;  Location: [facility] INVASIVE CV LAB;  Service: Cardiovascular;  Laterality: N/A;   LUMBAR LAMINECTOMY/DECOMPRESSION MICRODISCECTOMY Bilateral 06/16/2019   Procedure: Bilateral Lumbar Four-Five Laminectomy and Foraminotomy;  Surgeon: Julio Sicks, MD;  Location: [facility] OR;  Service: Neurosurgery;  Laterality: Bilateral;  posterior   REMOVAL OF STONES  08/21/2020   Procedure: REMOVAL OF STONES;  Surgeon: Jeani Hawking, MD;  Location: [facility] ENDOSCOPY;  Service: Endoscopy;;   RIGHT HEART CATH Right 03/31/2017   Procedure: RIGHT HEART CATH;  Surgeon: Marykay Lex, MD;  Location: [facility] INVASIVE CV LAB;  Service: Cardiovascular;  Laterality: Right;   SPHINCTEROTOMY  08/21/2020   Procedure: SPHINCTEROTOMY;  Surgeon: Jeani Hawking, MD;   Location: [facility] ENDOSCOPY;  Service: Endoscopy;;   TEE WITHOUT CARDIOVERSION N/A 04/08/2017   Procedure: TRANSESOPHAGEAL ECHOCARDIOGRAM (TEE);  Surgeon: Alleen Borne, MD;  Location: [facility] OR;  Service: Open Heart Surgery;  Laterality: N/A;   TRANSESOPHAGEAL ECHOCARDIOGRAM (CATH LAB) N/A 03/07/2023   Procedure: TRANSESOPHAGEAL ECHOCARDIOGRAM;  Surgeon: Tessa Lerner, DO;  Location: [facility] INVASIVE CV LAB;  Service: Cardiovascular;  Laterality: N/A;   TRANSURETHRAL RESECTION OF BLADDER TUMOR  05/29/2011   Procedure: TRANSURETHRAL RESECTION OF BLADDER TUMOR (TURBT);  Surgeon: Antony Haste, MD;  Location[facility];  Service: Urology;  Laterality: N/A;   VENTRICULAR ASSIST DEVICE INSERTION Right 03/31/2017   Procedure: VENTRICULAR ASSIST DEVICE INSERTION;  Surgeon: Marykay Lex, MD;  Location: [facility] INVASIVE CV LAB;  Service: Cardiovascular;  Laterality: Right;   Social History:  reports that he quit smoking about 6 years ago. His smoking use included cigarettes. He started smoking about 46 years ago. He has a 40 pack-year smoking history. He has never used smokeless tobacco. He reports that he does not currently use alcohol. He reports that he does not use drugs.  Family / Support Systems Marital Status: Married How Long?: 46 years Patient Roles: Spouse Spouse/Significant Other: Lupita Leash (wife) Children: Rob (soin; lives in [place]); Quincy (lives in [place]), Treynor (dtr; lives in [place]), and Grenloch (son; lives in [place])- PRN support Other Supports: PRN- dtr and SIL Anticipated Caregiver: Wife Ability/Limitations of Caregiver: Pt wife will be primary caregiver. Assistance from dtr Kirk (works from home) and SIL if needed for care needs. Caregiver Availability: 24/7 Family Dynamics: Pt lives with his wife.  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 Pt was in [employer] for 24  years. Education: 1st yr grad school. Health Literacy - How often do you need to have someone help you when you read instructions, pamphlets, or other written material from your doctor or pharmacy?: Never Writes: Yes Employment Status: Retired Date Retired/Disabled/Unemployed: 1998 Marine scientist Issues: Denies Guardian/Conservator: Product manager- wife Lupita Leash   Abuse/Neglect Abuse/Neglect Assessment Can Be Completed: Yes Physical Abuse: Denies Verbal Abuse: Denies Sexual Abuse: Denies Exploitation of patient/patient's resources: Denies Self-Neglect: Denies  Patient response to: Social Isolation - How often do you feel lonely or isolated from those around you?: Never  Emotional Status Pt's affect, behavior and adjustment status: Pt in good spirits at time of visit Recent Psychosocial Issues: Denies Psychiatric History: Denies Substance Abuse History: Denies  Patient / Family Perceptions, Expectations & Goals Pt/Family understanding of illness & functional limitations: Pt and famil have a general understanding of care needs Premorbid pt/family roles/activities: Independent Anticipated changes in roles/activities/participation: Assistance with ADLs/IADLs Pt/family expectations/goals: Pt would like to work on getting out of the bed safely, and find out source of infection.  Community Resources [employer]: None Premorbid Home Care/DME Agencies: None Transportation available at discharge: Pt wife Is the patient able to respond to transportation needs?: Yes In the past 12 months, has lack of transportation kept you from medical appointments or from getting medications?: No In the past 12 months, has lack of transportation kept you from meetings, work, or from getting things  needed for daily living?: No Resource referrals recommended: Neuropsychology  Discharge Planning Living Arrangements: Spouse/significant other Support Systems: Spouse/significant other, Children,  Other relatives Type of Residence: Private residence Insurance Resources: [employer] Financial Resources: Restaurant manager, fast food Screen Referred: No Living Expenses: Banker Management: Patient, Spouse Does the patient have any problems obtaining your medications?: Yes (Describe) Home Management: Pt and wife managed home care needs Patient/Family Preliminary Plans: TBD Care Coordinator Barriers to Discharge: Lack of/limited family support, Decreased caregiver support Care Coordinator Anticipated Follow Up Needs: HH/OP Expected length of stay: D/c 12/23  Clinical Impression Updated assessment.  Shereda Graw A Annalucia Laino 04/12/2023, 9:54 AM

## 2023-04-12 NOTE — Progress Notes (Signed)
Occupational Therapy Session Note  Patient Details  Name: VONTAE KAPLOWITZ MRN: [id number] Date of Birth: Jan 09, 1946  Today's Date: 04/12/2023 OT Individual Time: 1400-1445 OT Individual Time Calculation (min): 45 min    Short Term Goals: Week 1:  OT Short Term Goal 1 (Week 1): LB dressing with AE and min A OT Short Term Goal 2 (Week 1): Standing 2 min sink side with LRAD for grooming with min A OT Short Term Goal 3 (Week 1): Toilet/stall shower transfer with LRAD and min A OT Short Term Goal 4 (Week 1): UB self care seated sinkside with set up     Skilled Therapeutic Interventions/Progress Updates:  Pain: no pain at rest, some intermittent pain with movement, modified activity as needed   Pt received in bed connected to IV. Wife present.  Pt stated that he would not mind washing up but he did not want to get out of bed or work on standing due to still being upset that his knee buckled in earlier session.  Suggested pt still get to EOB to work on RLE strengthening to help prevent that from happening in his next PT session.  Also suggested he wait to do his UB wash up during the time frame the nurses change out his IV as the shirt could not be doffed at this time.   Using his leg lifter and hooking his L foot under his R foot pt able scoot feet towards edge of bed.  Had pt roll onto L side with cues to bring R shoulder over so he was in full torso rotation and then look down to floor as he pushed up to sit with with L arm. Pt able to do so with min A.  Once sitting EOB.  Had pt work on sit to semi squats with a focus on forward trunk flexion and weight shifting forward to elevate hips.  Pt pushed through his hands and able to elevate hips well.  He practiced 10 reps with a count of up 2, down 2.  Then he practiced holding isometric holds of 10 seconds. Pt then added in lift and weight shift so he could accomplish 6 scoots to move from end of bed to head of bed.     Sitting EOB worked on  upright posture and focus on nasal breathing. Pt cued to rest his tongue on roof of mouth to cue him to not breathe through mouth.    Pt moved back to supine with foot hook and mod A to lift legs.    From supine,  placed a slide board under R foot with towel on top for a/arom knee flex and extension. Had pt use gait belt around his thigh and pull on belt to help him assist his hip and knee flexion.  Pt did have pain with full hip flexion but was able to tolerate flexion to 45 degrees well.   Pt participated well. Alarm set, all needs met.   Therapy Documentation Precautions:  Precautions Precautions: Fall Precaution Comments: watch BP and HR Restrictions Weight Bearing Restrictions Per Provider Order: No RUE Weight Bearing Per Provider Order: Weight bearing as tolerated  Vital Signs: Therapy Vitals Temp: 99.2 F (37.3 C) Temp Source: Oral Pulse Rate: 96 Resp: 18 BP: 122/68 Patient Position (if appropriate): Lying Oxygen Therapy SpO2: 99 % O2 Device: Room Air     Therapy/Group: Individual Therapy  Kareem Aul 04/12/2023, 4:02 PM

## 2023-04-12 NOTE — Progress Notes (Signed)
Physical Therapy Session Note  Patient Details  Name: [name] [name] MRN: [id number] Date of Birth: Mar 24, 1946  Today's Date: 04/12/2023 PT Individual Time: 1050-1130 PT Individual Time Calculation (min): 40 min   Short Term Goals: Week 1:  PT Short Term Goal 1 (Week 1): Pt will complete sit<>stand with min assist PT Short Term Goal 2 (Week 1): Pt will complete bed mobility with min assist with LRAD PT Short Term Goal 3 (Week 1): Pt will ambulate 52' with min assist and LRAD PT Short Term Goal 4 (Week 1): Pt will complete up/down 1 step with BHRs mod assist  Skilled Therapeutic Interventions/Progress Updates:    Pt presents in room in bed, with RN present to start IV antibiotics. Pt agreeable to PT and reports feeling soreness in RLE. Session focused on bed mobility training, and therapeutic exercise to promote RLE strengthening and mobility.  Pt reporting feeling decreased confidence after having LOB this AM with PT while standing to RW. Pt agreeable to trial sit<>stands to sara stedy on EOB. Pt completes bed mobility using LLE to hook under RLE for management off bed, mod assist for trunk to upright. Pt requires increased time to complete but improved technique compared to using leg lifter.  Once EOB, sara stedy positioned in front of pt with max assist for managing RLE onto stedy platform. Pt completes sit<>stand with heavy mod assist to stedy. Pt remains standing ~20 seconds before sittting on posterior pads. Pt requires extended rest break in sitting then completes pull to stand in stedy again with mod assist. Pt remains standing fro ~30 seconds with cues for upright posture, upright gaze, and prompted to complete x10 shoulder retractions to promote improved tolerance to upright as pt demonstrating increasing trunk flexion with fatigue and time in standing. Pt returns to sitting on posterior pads and when asked if pt felt dizziness pt responds yes and then begins to demonstrate increased  forward flexion and slumping forward with decreased trunk control. Pt vitals assessed in sitting on posterior pads and noted below. Pt returns to supine, with max assist however prompted to complete by hooking LLE under RLE and improved ability to assist with BLEs into bed. Pt denies dizziness in supine and vitals reassessed and noted below.  Pt completes supine therex for RLE strengthening and ROM including: - supine hip flexion AAROM x10 RLE - supine ankle pump x20 (cues to decrease speed) - supine hip abduction AAROM x10  RLE  Pt remains supine with RLE elevated, all needs within reach, call light in place, bed alarm activated. Therapist notes redness and swelling at IV site, RN notified and present at end of session.  Vitals: Sitting, symptomatic: BP 145/55, HR 116 Supine, asymptomatic: BP 143/73, HR 108  Therapy Documentation Precautions:  Precautions Precautions: Fall Precaution Comments: watch BP and HR Restrictions Weight Bearing Restrictions Per Provider Order: No RUE Weight Bearing Per Provider Order: Weight bearing as tolerated  Therapy/Group: Individual Therapy  Edwin Cap PT, DPT 04/12/2023, 12:37 PM

## 2023-04-12 NOTE — Progress Notes (Signed)
    [facility]   Reason for visit: Follow up on leukocytosis  Interval History: hip aspiration performed and abundant WBCs present and gram stain with GPRs.  Complains of pain with ambulation.     Physical Exam: Constitutional:  Vitals:   04/12/23 0605 04/12/23 1320  BP: 119/68 122/68  Pulse: 96 96  Resp: 18 18  Temp:  99.2 F (37.3 C)  SpO2: 96% 99%   patient appears in NAD Respiratory: Normal respiratory effort  Review of Systems: Constitutional: negative for fevers and chills  Lab Results  Component Value Date   WBC 33.9 (H) 04/12/2023   HGB 10.1 (L) 04/12/2023   HCT 29.7 (L) 04/12/2023   MCV 77.7 (L) 04/12/2023   PLT 569 (H) 04/12/2023    Lab Results  Component Value Date   CREATININE 1.84 (H) 04/12/2023   BUN 37 (H) 04/12/2023   NA 130 (L) 04/12/2023   K 4.2 04/12/2023   CL 97 (L) 04/12/2023   CO2 25 04/12/2023    Lab Results  Component Value Date   ALT 141 (H) 04/09/2023   AST 98 (H) 04/09/2023   ALKPHOS 108 04/09/2023     Microbiology: Recent Results (from the past 240 hours)  Aerobic/Anaerobic Culture w Gram Stain (surgical/deep wound)     Status: None (Preliminary result)   Collection Time: 04/11/23  1:39 PM   Specimen: Abscess  Result Value Ref Range Status   Specimen Description ABSCESS  Final   Special Requests right hip  Final   Gram Stain   Final    ABUNDANT WBC PRESENT, PREDOMINANTLY PMN FEW GRAM POSITIVE RODS    Culture   Final    NO GROWTH < 24 HOURS Performed at [facility], [address] [place], [place] [address]    Report Status PENDING  Incomplete    Impression/Plan:  1. Iliopsoas infection - aspiration done and turbid brownish fluid noted and gram stain with GPRs.  Not a typical pathogen but will start vancomycin and monitor cultures.    2.  Medication monitoring - some issue with infusion of vancomycin but will be ideal to stay on this pending ID of organism.    3.  Leukocytosis -  improved after aspiration.  Will continue to monitor and consider reimaging of his hip with a CT for ? Need for drainage, if no singificant improvement on antibiotics alone.    Dr. Elinor Parkinson will monitor cultures over the weekend and ID will follow up again on Monday

## 2023-04-12 NOTE — Progress Notes (Signed)
Alerted by nursing staff of localized erythema to left [facility] after initiation of after initiation of vancomycin IV.  Infusion stopped, IV removed and ice pack applied.  Denies pain at site or generalized itching.  Will recite IV and right upper extremity and resume vancomycin administration.

## 2023-04-12 NOTE — IPOC Note (Signed)
Overall Plan of Care [facility]) Patient Details Name: [name] [name] MRN: [id number] DOB: 12-28-1945  Admitting Diagnosis: [facility] Problems: Principal Problem:   Debility Active Problems:   Leukocytosis     Functional Problem List: Nursing Bowel, Pain, Endurance, Medication Management, Safety, Skin Integrity  PT Balance  OT Balance, Edema, Nutrition, Safety, Endurance, Pain, Sensory, Cognition, Motor, Skin Integrity  SLP    TR         Basic ADL's: OT Grooming, Bathing, Dressing, Toileting     Advanced  ADL's: OT Laundry, Simple Meal Preparation, Light Housekeeping     Transfers: PT Bed Mobility, Bed to Chair, Customer service manager, Tub/Shower     Locomotion: PT Ambulation, Stairs     Additional Impairments: OT    SLP        TR      Anticipated Outcomes Item Anticipated Outcome  Self Feeding indep  Swallowing      Basic self-care  CGA LB, set up UB  Toileting  CGA   Bathroom Transfers CGA  Bowel/Bladder  manage bowel w mod I assist  Transfers  supervision  Locomotion  supervision ambulatory  Communication     Cognition     Pain  < 4 with prns  Safety/Judgment  manage w cues   Therapy Plan: PT Intensity: Minimum of 1-2 x/day ,45 to 90 minutes PT Frequency: 5 out of 7 days PT Duration Estimated Length of Stay: 2 weeks OT Intensity: Minimum of 1-2 x/day, 45 to 90 minutes OT Frequency: 5 out of 7 days OT Duration/Estimated Length of Stay: 2 weeks     Team Interventions: Nursing Interventions Patient/Family Education, Disease Management/Prevention, Discharge Planning, Pain Management, Bowel Management, Medication Management, Skin Care/Wound Management  PT interventions Ambulation/gait training, Disease management/prevention, Pain management, Stair training, Warden/ranger, DME/adaptive equipment instruction, Patient/family education, Therapeutic Activities, Psychosocial support, Therapeutic Exercise, Community reintegration,  Functional mobility training, UE/LE Strength taining/ROM, Discharge planning, Neuromuscular re-education, UE/LE Coordination activities  OT Interventions Warden/ranger, Cognitive remediation/compensation, Community reintegration, Discharge planning, Disease mangement/prevention, DME/adaptive equipment instruction, Functional electrical stimulation, Functional mobility training, Neuromuscular re-education, Pain management, Patient/family education, Psychosocial support, Self Care/advanced ADL retraining, Skin care/wound managment, Splinting/orthotics, Therapeutic Activities, Therapeutic Exercise, UE/LE Strength taining/ROM, UE/LE Coordination activities, Visual/perceptual remediation/compensation, Wheelchair propulsion/positioning  SLP Interventions    TR Interventions    SW/CM Interventions Discharge Planning, Psychosocial Support, Patient/Family Education   Barriers to Discharge MD  Medical stability, Home enviroment access/loayout, Wound care, Lack of/limited family support, and Insurance for SNF coverage  Nursing Decreased caregiver support, Home environment access/layout, Weight bearing restrictions 2 level 2 ste left rail w spouse;pt. reports having bed rail at home so utilized bed rail to simulate home env. pt. exited on R side. able to manage bles without assistance but required light trunk support for coming upright.  for back to bed pt. states he does not scoot towards hob as rail is in the way so he demonstrated how he gets into bed he scoots backwards first then laid back and brought bles into bed without assistance.  heavy reliance on using bed controls to elevated knees/feet. reviewed if he does not have these functions on his bed to try and find ways to be comfortable without.  he states "ill just stack pillows"  PT Inaccessible home environment, Home environment access/layout 2 STE, main bedroom upstairs  OT Inaccessible home environment, Home environment access/layout pain,  frequent falls, FF to 2nd floor  SLP      SW Lack of/limited  family support, Decreased caregiver support     Team Discharge Planning: Destination: PT-Home ,OT- Home , SLP-Home Projected Follow-up: PT-Home health PT, OT-  Home health OT, SLP-None Projected Equipment Needs: PT-Rolling walker with 5" wheels, OT- 3 in 1 bedside comode, Tub/shower bench, SLP-None recommended by SLP Equipment Details: PT- , OT-3 in 1 commode, TTB if shower stool is not suffcient pt already has Patient/family involved in discharge planning: PT- Patient,  OT-Patient, SLP-Patient  MD ELOS: 10-12 days Medical Rehab Prognosis:  Good Assessment: The patient has been admitted for CIR therapies with the diagnosis of debility due to retroperitoneal hematoma. The team will be addressing functional mobility, strength, stamina, balance, safety, adaptive techniques and equipment, self-care, bowel and bladder mgt, patient and caregiver education. Goals have been set at supvervision. Anticipated discharge destination is home.       See Team Conference Notes for weekly updates to the plan of care

## 2023-04-12 NOTE — Progress Notes (Signed)
Pharmacy Antibiotic Note  [name] [name] is a 77 y.o. male admitted on 04/08/2023 and now with concern for R-hip infection. Aspiration done 12/11 showed WBC and GPR on gram stain. Pharmacy has been consulted for Vancomycin dosing.  Plan: - Vancomycin 1500 mg IV x 1 dose followed by 1000 mg IV every 24 hours (eAUC 419, VT 11.3, Vd 0.72, SCr 1.84) - Will continue to follow renal function, culture results, LOT, and antibiotic de-escalation plans   Height: 6\' 2"  (188 cm) Weight: 89.8 kg (197 lb 15.6 oz) IBW/kg (Calculated) : 82.2  Temp (24hrs), Avg:98.8 F (37.1 C), Min:98.3 F (36.8 C), Max:99.2 F (37.3 C)  Recent Labs  Lab 04/08/23 0301 04/09/23 0801 04/10/23 0603 04/11/23 0629 04/12/23 0609  WBC 37.8* 43.7* 35.2* 40.6* 33.9*  CREATININE 2.10* 1.93* 1.89* 1.95* 1.84*    Estimated Creatinine Clearance: 39.1 mL/min (A) (by C-G formula based on SCr of 1.84 mg/dL (H)).    Allergies  Allergen Reactions   Spironolactone Other (See Comments)    Painful gynecomastia   Semaglutide     Nausea and bloating   Tramadol Other (See Comments)    Sleep disturbance.  Apnea spells.   Empagliflozin Rash    Rash at the groin Jardiance   Niacin And Related Hives    Antimicrobials this admission: Zosyn 12/4 >> 12/6 Augmentin 12/6 >> 12/10 Vancomycin 12/13 >>  Microbiology results: 12/12 R-hip asp >> GPR on gram stain  Thank you for allowing pharmacy to be a part of this patient's care.  Georgina Pillion, PharmD, BCPS, BCIDP Infectious Diseases Clinical Pharmacist 04/12/2023 3:05 PM   **Pharmacist phone directory can now be found on [email or web address] ([email or web address]).  Listed under [facility].

## 2023-04-13 DIAGNOSIS — R5381 Other malaise: Secondary | ICD-10-CM | POA: Diagnosis not present

## 2023-04-13 DIAGNOSIS — I10 Essential (primary) hypertension: Secondary | ICD-10-CM | POA: Diagnosis not present

## 2023-04-13 LAB — CBC WITH DIFFERENTIAL/PLATELET
Abs Immature Granulocytes: 0 10*3/uL (ref 0.00–0.07)
Basophils Absolute: 0 10*3/uL (ref 0.0–0.1)
Basophils Relative: 0 %
Eosinophils Absolute: 0 10*3/uL (ref 0.0–0.5)
Eosinophils Relative: 0 %
HCT: 29.6 % — ABNORMAL LOW (ref 39.0–52.0)
Hemoglobin: 10.2 g/dL — ABNORMAL LOW (ref 13.0–17.0)
Lymphocytes Relative: 4 %
Lymphs Abs: 1 10*3/uL (ref 0.7–4.0)
MCH: 26.8 pg (ref 26.0–34.0)
MCHC: 34.5 g/dL (ref 30.0–36.0)
MCV: 77.7 fL — ABNORMAL LOW (ref 80.0–100.0)
Monocytes Absolute: 1 10*3/uL (ref 0.1–1.0)
Monocytes Relative: 4 %
Neutro Abs: 23.3 10*3/uL — ABNORMAL HIGH (ref 1.7–7.7)
Neutrophils Relative %: 92 %
Platelets: 568 10*3/uL — ABNORMAL HIGH (ref 150–400)
RBC: 3.81 MIL/uL — ABNORMAL LOW (ref 4.22–5.81)
RDW: 18.6 % — ABNORMAL HIGH (ref 11.5–15.5)
WBC: 25.3 10*3/uL — ABNORMAL HIGH (ref 4.0–10.5)
nRBC: 0 % (ref 0.0–0.2)
nRBC: 0 /100{WBCs}

## 2023-04-13 LAB — BASIC METABOLIC PANEL
Anion gap: 3 — ABNORMAL LOW (ref 5–15)
BUN: 36 mg/dL — ABNORMAL HIGH (ref 8–23)
CO2: 28 mmol/L (ref 22–32)
Calcium: 7.5 mg/dL — ABNORMAL LOW (ref 8.9–10.3)
Chloride: 99 mmol/L (ref 98–111)
Creatinine, Ser: 1.84 mg/dL — ABNORMAL HIGH (ref 0.61–1.24)
GFR, Estimated: 37 mL/min — ABNORMAL LOW (ref >60)
Glucose, Bld: 136 mg/dL — ABNORMAL HIGH (ref 70–99)
Potassium: 4.4 mmol/L (ref 3.5–5.1)
Sodium: 130 mmol/L — ABNORMAL LOW (ref 135–145)

## 2023-04-13 LAB — GLUCOSE, CAPILLARY
Glucose-Capillary: 91 mg/dL (ref 70–99)
Glucose-Capillary: 205 mg/dL — ABNORMAL HIGH (ref 70–99)
Glucose-Capillary: 160 mg/dL — ABNORMAL HIGH (ref 70–99)
Glucose-Capillary: 182 mg/dL — ABNORMAL HIGH (ref 70–99)

## 2023-04-13 MED ORDER — MEDIHONEY WOUND/BURN DRESSING EX PSTE
1.0000 | PASTE | Freq: Every day | CUTANEOUS | Status: DC
  Administered 2023-04-13 – 2023-04-14 (×2): 1 via TOPICAL
  Filled 2023-04-13: qty 44

## 2023-04-13 NOTE — Progress Notes (Signed)
PROGRESS NOTE   Subjective/Complaints:  Pt doing well, slept ok, pain well controlled, LBM last night, urinating fine. Denies any other complaints or concerns.   ROS: Right lower extremity lateral thigh burning pain -ongoing. +vertigo -not recurrent today  denies fevers, chills, N/V, abdominal pain, constipation, diarrhea, SOB, cough, chest pain, new weakness or paraesthesias.    Objective:   No results found.  Recent Labs    04/12/23 0609 04/13/23 0528  WBC 33.9* 25.3*  HGB 10.1* 10.2*  HCT 29.7* 29.6*  PLT 569* 568*   Recent Labs    04/12/23 0609 04/13/23 0528  NA 130* 130*  K 4.2 4.4  CL 97* 99  CO2 25 28  GLUCOSE 139* 136*  BUN 37* 36*  CREATININE 1.84* 1.84*  CALCIUM 7.6* 7.5*    Intake/Output Summary (Last 24 hours) at 04/13/2023 1500 Last data filed at 04/13/2023 0451 Gross per 24 hour  Intake 240 ml  Output 800 ml  Net -560 ml      Pressure Injury 04/08/23 Ankle Posterior;Right Stage 2 -  Partial thickness loss of dermis presenting as a shallow open injury with a red, pink wound bed without slough. unattached edges scant drainage. This LDA replaces one from 03/03/23 that was entered (Active)  04/08/23 1730  Location: Ankle  Location Orientation: Posterior;Right  Staging: Stage 2 -  Partial thickness loss of dermis presenting as a shallow open injury with a red, pink wound bed without slough.  Wound Description (Comments): unattached edges scant drainage. This LDA replaces one from 03/03/23 that was entered inaccurately as a blister that had opened.  Present on Admission: Yes    Physical Exam: Vital Signs Blood pressure 123/62, pulse 97, temperature 98.5 F (36.9 C), temperature source Oral, resp. rate 16, height 6\' 2"  (1.88 m), weight 89.7 kg, SpO2 95%.   PE: Constitution: Appropriate appearance for age. No apparent distress.  Laying in bed. HEENT: NCAT.  EOMI.  PERRLA. Resp: No respiratory  distress. No accessory muscle usage. on RA and CTAB Cardio: Well perfused appearance.  2+ pitting edema in the right lower extremity greatest at the pretibial area; trace left lower extremity Abdomen: Nondistended. Nontender.  Nondistended.  Positive bowel sounds. Psych: Appropriate mood and affect.  Mildly anxious. Neuro: AAOx4. No apparent cognitive deficits.   PRIOR EXAMS: Skin:  - skin rash along right posterior trunk essentially resolved.  - Mepilex dressing to right heel, sacral heart to low back for DTI's, unstageable -Left antecubital fossa erythema surrounding prior IV site; no warmth, fluctuance, or drainage.  Neurologic Exam:   DTRs: Reflexes were 2+ in bilateral achilles, patella, biceps, BR and triceps. Sensory exam: revealed normal sensation in all dermatomal regions in bilateral upper extremities, bilateral lower extremities, and with reduced sensation to light touch in right anterior lateral thigh -ongoing Motor exam: Antigravity and against resistance in all 4 extremities, lower extremity hip flexion and knee extension limited due to pain  coordination: Fine motor coordination was normal.             Assessment/Plan: 1. Functional deficits which require 3+ hours per day of interdisciplinary therapy in a comprehensive inpatient rehab setting. Physiatrist is providing close team supervision  and 24 hour management of active medical problems listed below. Physiatrist and rehab team continue to assess barriers to discharge/monitor patient progress toward functional and medical goals  Care Tool:  Bathing    Body parts bathed by patient: Right arm, Left arm, Chest, Face, Abdomen   Body parts bathed by helper: Front perineal area, Right lower leg, Left upper leg, Right upper leg, Buttocks, Left lower leg     Bathing assist Assist Level: Total Assistance - Patient < 25%     Upper Body Dressing/Undressing Upper body dressing   What is the patient wearing?: Pull over  shirt    Upper body assist Assist Level: Contact Guard/Touching assist    Lower Body Dressing/Undressing Lower body dressing      What is the patient wearing?: Pants     Lower body assist Assist for lower body dressing: Total Assistance - Patient < 25%     Toileting Toileting    Toileting assist Assist for toileting: Maximal Assistance - Patient 25 - 49%     Transfers Chair/bed transfer  Transfers assist  Chair/bed transfer activity did not occur: Safety/medical concerns (unsafe to get up)  Chair/bed transfer assist level: 2 Helpers     Locomotion Ambulation   Ambulation assist   Ambulation activity did not occur: Safety/medical concerns (amb 8' in //bars with min assist and +2 for WC follow)          Walk 10 feet activity   Assist  Walk 10 feet activity did not occur: Safety/medical concerns        Walk 50 feet activity   Assist Walk 50 feet with 2 turns activity did not occur: Safety/medical concerns         Walk 150 feet activity   Assist Walk 150 feet activity did not occur: Safety/medical concerns         Walk 10 feet on uneven surface  activity   Assist Walk 10 feet on uneven surfaces activity did not occur: Safety/medical concerns         Wheelchair     Assist Is the patient using a wheelchair?: Yes Type of Wheelchair: Manual    Wheelchair assist level: Contact Guard/Touching assist Max wheelchair distance: 44'    Wheelchair 50 feet with 2 turns activity    Assist        Assist Level: Contact Guard/Touching assist   Wheelchair 150 feet activity     Assist      Assist Level: Moderate Assistance - Patient 50 - 74%   Blood pressure 123/62, pulse 97, temperature 98.5 F (36.9 C), temperature source Oral, resp. rate 16, height 6\' 2"  (1.88 m), weight 89.7 kg, SpO2 95%.  Medical Problem List and Plan: 1. Functional deficits secondary to retroperitoneal hematoma after recent fall.             -also  right right AC jt injury, ?clavicle fx             -patient may  shower  -Stable to continue CIR             -ELOS/Goals: 10-12 days, supervision goals with PT, OT - 12/23 DC date  - 12/10: Family installing chair lift. Has HH in place. Functionally limited by RLE pain.    2.  Antithrombotics: -DVT/anticoagulation:  Pharmaceutical: Eliquis 5mg  BID             -antiplatelet therapy: none             -pt with new  right lower extremity edema and thigh pain. Dopplers pending                         -eliquis recently resumed, so findings won't change medical mgt 3. Pain Management: Tylenol, oxycodone as needed for hip  -right thigh pain. ?muscle strain. No sensory findings. Most recent CT reviewed.  It's possible he's had further migration of blood further down into the thigh as there was blood along the distal iliopsoas on that CT                         -add prn robaxin                         -order kpad                          -dopplers pending as above - 12/10: Per OT Max 2 OOB due to leg pain.  On exam,?  Meralgia paresthetica based on distribution, will add gabapentin 100 mg 3 times daily due to renal deficiency.  Increase oxycodone to 5 to 10 mg as needed.  Not scheduled Tylenol due to elevated LFTs. -12/12: Episode of dizziness a.m., had taken a.m. oxycodone several hours prior.  Decrease as needed to 5 mg every 6 hours as needed.  DC gabapentin. 12-13: Dizziness not recurrent.  Start Robaxin 750 mg 3 times daily for ongoing thigh pain.  4. Mood/Behavior/Sleep: LCSW to evaluate and provide emotional support             -continue melatonin 10mg  (ordered as 5mg  PRN though?)             -antipsychotic agents: n/a   5. Neuropsych/cognition: This patient is capable of making decisions on his own behalf. - 12/10: SLP consult for reported cognitive deficits; may be medication induced per wife  6. Skin/Wound Care: Routine skin care checks   - Stage 2 to sacrum reported-now  unstageable 12-11: Unstageable found on right heel.  Added Medihoney to heel and low back, changed to low-air-loss mattress - WOCN consulted 12/11 for recs   - Lateral trunk rash managed with steroid creams essentially resolved   -12-13: Left antecubital IV infiltration.  Ice and compression to area.  7. Fluids/Electrolytes/Nutrition/hyponatremia: Strict Is and Os and follow-up chemistries   - 12/10: Fluid restriction for mild hyponatremia today.  Repeat tomorrow a.m. - 12/11: Na 129 today; was not compliant with fluid restriction yesterday. Continue today and repeat in AM tomorrow; if no improvement will need to start gentle IVF. Add 40 meq daily potassium.  12-12: NA 131, improved.  Potassium normalized.  DC supplementation of potassium given all priors were normal on same dose of torsemide.  04/13/23: NA 130, BUN/creatinine stable, same as yesterday.  Monitor   8: Hypertension: monitor TID and prn (Entresto, torsemide held)>> still on torsemide 20mg  daily             -currently on midodrine 10 mg TID for hypotension -12-12: Slightly hypotensive, on fluid restriction with diuresis, would continue midodrine for now.  Questionable orthostatic episode today, symptoms more vertiginous, treatment as below.  12-13: BP improved, no further symptoms orthostasis, monitor  -04/13/23 BPs great, monitor Vitals:   04/09/23 2022 04/10/23 0510 04/10/23 1318 04/10/23 1925  BP: 125/72 118/67 (!) 126/57 95/70   04/11/23 0601 04/11/23 1200 04/11/23 2007 04/12/23 0605  BP: [name])  118/58 139/74 (!) 112/57 119/68   04/12/23 1320 04/12/23 1919 04/13/23 0449 04/13/23 1333  BP: 122/68 113/60 117/69 123/62     9:  Cardiorenal syndrome/acute kidney Injury on chronic kidney disease stage IIIb (baseline creatinine around 2)              -milrinone discontinued 12/08             -no indication for HD             -daily weight             -continue sodium bicarbonate 650 mg BID             -continue torsemide 20 mg  daily  12/10-14: Renal function appears stable.     10: Fall/right retroperitoneal hematoma: now back on Eliquis; H and H stable             -follow-up CBC - stable   11: Atypical atrial flutter; hx of pAF: rate controlled (low 100s)             -on Eliquis 5mg  BID, amiodarone 200mg  daily   12: COPD: former tobacco smoker, quit 2018   13: DM-2: CBGs QID; A1c = 6.9% (home on Ozempic; metformin discontinued due to CKD)             -continue SSI  Blood sugars have remained relatively stable -04/13/23 CBGs variable, many >200, would probably benefit from some optimization, but defer to weekday team to discuss options Recent Labs    04/12/23 2100 04/13/23 0553 04/13/23 1151  GLUCAP 241* 91 205*       14: CAD: s/p 2-vessel CABG 2018             -restart statin once LFTs normalized             -follow-up CMP Mon 12/16   15: Acute on chronic systolic and diastolic heart failure, 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  I's and O's remain significantly positive.  Added TED hose for bilateral lower extremity edema; weights remain stable, no symptoms of volume overload. 12/12-14: Weights stable. Can't tolerate TEDs, advised we could ACE wrap his RLE to help with the swelling; pt will consider it [facility] Weights   04/08/23 1745 04/10/23 0634 04/13/23 0544  Weight: 89.4 kg 89.8 kg 89.7 kg      16: Elevated transaminases, up trending: ? due to congestive hepatopathy             -follow-up CMP-downtrending, but persistent, f/up Monday   17: Multiple falls; AC joint injury/possible right distal clavicular fracture 11/05   18: Possible pneumonia: continue Augmentin to finish the course   - 12/10:  Repeat chest x-ray without consolidation  - 12/11: Augmentin stopped per ID recommendations  19: Leukocytosis, chronic: negative infectious work-up  except possible PNA; had short course steroids for skin rash             -appears to be off prednisone now             -on Augmentin (end 12/11)             -follow-up CBC with diff on 12/10 12/10: Uptrending leukocytosis 28.3 -> 37.8 -> 43.7 despite stopping  p.o. steroids on 12-7.  Extensive inpatient workup for leukocytosis described in H&P.  Does have chronic underlying leukocytosis in the 20s.  Will consult infectious disease for additional workup/ideas today, feel less likely reactive given that thrombocytosis is improving and other parameters remained stable.  12/11: Mildly decreased to 35 today. Remains afebrile. ID consulted yesterday, recommending DC augmentin, daily monitorring, and drainage of psoas fluid collection if no improvement. Appreciate their insight and recommendations. 12-12: WBC back up to 40, thrombocytosis mildly uptrending.  Afebrile, per infectious disease aspiration of right hip fluid performed and cultures pending.  Appreciate their recommendations and management. 12-13: 300 cc hip aspirate with multiple WBCs and PMNs.  Patient started on vancomycin per infectious disease.  Monitor for final culture results.-- still incubating 12/14  20: Lung nodule: follow-up outpatient with PCP   21: Hx of bladder cancer s/p TURP 2013   22: Skin rash treated with prednisone/hydroxyzine/calamine lotion             -appears prednisone has been discontinued-rash essentially resolved.   23: Chronic anemia: H and H stable times 72 hours             -follow-up CBC-stable 12/14   24: Thrombocytosis; fluctuating: follow-up CBC.  Ongoing..   25. Vertigo/dizziness.  Started 12-12, no nystagmus on exam. -orthostatic vitals every morning -Added as needed meclizine 12.5 mg twice daily -Possible symptoms are secondary to medication burden, would wean down pain medications as tolerated -12-13: Improved,  LOS: 5 days A FACE TO FACE EVALUATION WAS PERFORMED  Ligia Duguay 04/13/2023,  3:00 PM

## 2023-04-14 DIAGNOSIS — R5381 Other malaise: Secondary | ICD-10-CM | POA: Diagnosis not present

## 2023-04-14 DIAGNOSIS — G4700 Insomnia, unspecified: Secondary | ICD-10-CM

## 2023-04-14 DIAGNOSIS — I10 Essential (primary) hypertension: Secondary | ICD-10-CM | POA: Diagnosis not present

## 2023-04-14 LAB — BASIC METABOLIC PANEL
Anion gap: 9 (ref 5–15)
BUN: 33 mg/dL — ABNORMAL HIGH (ref 8–23)
CO2: 28 mmol/L (ref 22–32)
Calcium: 7.8 mg/dL — ABNORMAL LOW (ref 8.9–10.3)
Chloride: 96 mmol/L — ABNORMAL LOW (ref 98–111)
Creatinine, Ser: 2.25 mg/dL — ABNORMAL HIGH (ref 0.61–1.24)
GFR, Estimated: 29 mL/min — ABNORMAL LOW (ref >60)
Glucose, Bld: 160 mg/dL — ABNORMAL HIGH (ref 70–99)
Potassium: 3.9 mmol/L (ref 3.5–5.1)
Sodium: 133 mmol/L — ABNORMAL LOW (ref 135–145)

## 2023-04-14 LAB — GLUCOSE, CAPILLARY
Glucose-Capillary: 158 mg/dL — ABNORMAL HIGH (ref 70–99)
Glucose-Capillary: 202 mg/dL — ABNORMAL HIGH (ref 70–99)
Glucose-Capillary: 142 mg/dL — ABNORMAL HIGH (ref 70–99)
Glucose-Capillary: 135 mg/dL — ABNORMAL HIGH (ref 70–99)

## 2023-04-14 MED ORDER — MELATONIN 5 MG PO TABS
5.0000 mg | ORAL_TABLET | Freq: Every evening | ORAL | Status: DC | PRN
  Filled 2023-04-14 (×2): qty 1

## 2023-04-14 MED ORDER — MEDIHONEY WOUND/BURN DRESSING EX PSTE
1.0000 | PASTE | Freq: Every day | CUTANEOUS | Status: DC
  Administered 2023-04-14 – 2023-04-20 (×6): 1 via TOPICAL
  Filled 2023-04-14: qty 44

## 2023-04-14 MED ORDER — ZOLPIDEM TARTRATE 5 MG PO TABS
5.0000 mg | ORAL_TABLET | Freq: Every evening | ORAL | Status: DC | PRN
Start: 2023-04-14 — End: 2023-04-20
  Administered 2023-04-14 – 2023-04-18 (×5): 5 mg via ORAL
  Filled 2023-04-14 (×5): qty 1

## 2023-04-14 MED ORDER — GERHARDT'S BUTT CREAM
TOPICAL_CREAM | Freq: Two times a day (BID) | CUTANEOUS | Status: DC
  Administered 2023-04-15: 1 via TOPICAL
  Filled 2023-04-14: qty 60

## 2023-04-14 NOTE — Progress Notes (Addendum)
PROGRESS NOTE   Subjective/Complaints:  Pt doing well today, but didn't sleep well. Takes 5-10mg  ambien at home and that wasn't reordered when he got to the hospital. Would like it. Pain well controlled, LBM 2 days ago-- will ask for sorbitol. Urinating fine. Denies any other complaints or concerns.   ROS: Right lower extremity lateral thigh burning pain -ongoing. +vertigo -not recurrent today, +insomnia  denies fevers, chills, N/V, abdominal pain, constipation, diarrhea, SOB, cough, chest pain, new weakness or paraesthesias.    Objective:   No results found.  Recent Labs    04/12/23 0609 04/13/23 0528  WBC 33.9* 25.3*  HGB 10.1* 10.2*  HCT 29.7* 29.6*  PLT 569* 568*   Recent Labs    04/13/23 0528 04/14/23 0648  NA 130* 133*  K 4.4 3.9  CL 99 96*  CO2 28 28  GLUCOSE 136* 160*  BUN 36* 33*  CREATININE 1.84* 2.25*  CALCIUM 7.5* 7.8*    Intake/Output Summary (Last 24 hours) at 04/14/2023 1204 Last data filed at 04/14/2023 0700 Gross per 24 hour  Intake --  Output 700 ml  Net -700 ml      Pressure Injury 04/08/23 Ankle Posterior;Right Stage 2 -  Partial thickness loss of dermis presenting as a shallow open injury with a red, pink wound bed without slough. unattached edges scant drainage. This LDA replaces one from 03/03/23 that was entered (Active)  04/08/23 1730  Location: Ankle  Location Orientation: Posterior;Right  Staging: Stage 2 -  Partial thickness loss of dermis presenting as a shallow open injury with a red, pink wound bed without slough.  Wound Description (Comments): unattached edges scant drainage. This LDA replaces one from 03/03/23 that was entered inaccurately as a blister that had opened.  Present on Admission: Yes     Pressure Injury 04/14/23 Heel Left;Lateral Deep Tissue Pressure Injury - Purple or maroon localized area of discolored intact skin or blood-filled blister due to damage of  underlying soft tissue from pressure and/or shear. (Active)  04/14/23 1010  Location: Heel  Location Orientation: Left;Lateral  Staging: Deep Tissue Pressure Injury - Purple or maroon localized area of discolored intact skin or blood-filled blister due to damage of underlying soft tissue from pressure and/or shear.  Wound Description (Comments):   Present on Admission: No    Physical Exam: Vital Signs Blood pressure 131/73, pulse 100, temperature 98.2 F (36.8 C), temperature source Oral, resp. rate 18, height 6\' 2"  (1.88 m), weight 89.7 kg, SpO2 99%.   PE: Constitution: Appropriate appearance for age. No apparent distress.  Laying in bed. HEENT: NCAT.  EOMI.  PERRLA. Resp: No respiratory distress. No accessory muscle usage. on RA and CTAB Cardio: Well perfused appearance.  2+ pitting edema in the right lower extremity greatest at the pretibial area up to knee at least; trace left lower extremity Abdomen: Nondistended. Nontender.  Nondistended.  Positive bowel sounds. Psych: Appropriate mood and affect.  Mildly anxious. Neuro: AAOx4. No apparent cognitive deficits.   PRIOR EXAMS: Skin:  - skin rash along right posterior trunk essentially resolved.  - Mepilex dressing to right heel, sacral heart to low back for DTI's, unstageable -Left antecubital fossa erythema surrounding  prior IV site; no warmth, fluctuance, or drainage.  Neurologic Exam:   DTRs: Reflexes were 2+ in bilateral achilles, patella, biceps, BR and triceps. Sensory exam: revealed normal sensation in all dermatomal regions in bilateral upper extremities, bilateral lower extremities, and with reduced sensation to light touch in right anterior lateral thigh -ongoing Motor exam: Antigravity and against resistance in all 4 extremities, lower extremity hip flexion and knee extension limited due to pain  coordination: Fine motor coordination was normal.             Assessment/Plan: 1. Functional deficits which require  3+ hours per day of interdisciplinary therapy in a comprehensive inpatient rehab setting. Physiatrist is providing close team supervision and 24 hour management of active medical problems listed below. Physiatrist and rehab team continue to assess barriers to discharge/monitor patient progress toward functional and medical goals  Care Tool:  Bathing    Body parts bathed by patient: Right arm, Left arm, Chest, Face, Abdomen   Body parts bathed by helper: Front perineal area, Right lower leg, Left upper leg, Right upper leg, Buttocks, Left lower leg     Bathing assist Assist Level: Total Assistance - Patient < 25%     Upper Body Dressing/Undressing Upper body dressing   What is the patient wearing?: Pull over shirt    Upper body assist Assist Level: Contact Guard/Touching assist    Lower Body Dressing/Undressing Lower body dressing      What is the patient wearing?: Pants     Lower body assist Assist for lower body dressing: Total Assistance - Patient < 25%     Toileting Toileting    Toileting assist Assist for toileting: Maximal Assistance - Patient 25 - 49%     Transfers Chair/bed transfer  Transfers assist  Chair/bed transfer activity did not occur: Safety/medical concerns (unsafe to get up)  Chair/bed transfer assist level: 2 Helpers     Locomotion Ambulation   Ambulation assist   Ambulation activity did not occur: Safety/medical concerns (amb 8' in //bars with min assist and +2 for WC follow)          Walk 10 feet activity   Assist  Walk 10 feet activity did not occur: Safety/medical concerns        Walk 50 feet activity   Assist Walk 50 feet with 2 turns activity did not occur: Safety/medical concerns         Walk 150 feet activity   Assist Walk 150 feet activity did not occur: Safety/medical concerns         Walk 10 feet on uneven surface  activity   Assist Walk 10 feet on uneven surfaces activity did not occur:  Safety/medical concerns         Wheelchair     Assist Is the patient using a wheelchair?: Yes Type of Wheelchair: Manual    Wheelchair assist level: Contact Guard/Touching assist Max wheelchair distance: 40'    Wheelchair 50 feet with 2 turns activity    Assist        Assist Level: Contact Guard/Touching assist   Wheelchair 150 feet activity     Assist      Assist Level: Moderate Assistance - Patient 50 - 74%   Blood pressure 131/73, pulse 100, temperature 98.2 F (36.8 C), temperature source Oral, resp. rate 18, height 6\' 2"  (1.88 m), weight 89.7 kg, SpO2 99%.  Medical Problem List and Plan: 1. Functional deficits secondary to retroperitoneal hematoma after recent fall.             -  also right right AC jt injury, ?clavicle fx             -patient may  shower  -Stable to continue CIR             -ELOS/Goals: 10-12 days, supervision goals with PT, OT - 12/23 DC date  - 12/10: Family installing chair lift. Has HH in place. Functionally limited by RLE pain.    2.  Antithrombotics: -DVT/anticoagulation:  Pharmaceutical: Eliquis 5mg  BID             -antiplatelet therapy: none             -pt with new right lower extremity edema and thigh pain. Dopplers pending                         -eliquis recently resumed, so findings won't change medical mgt 3. Pain Management: Tylenol, oxycodone as needed for hip  -right thigh pain. ?muscle strain. No sensory findings. Most recent CT reviewed.  It's possible he's had further migration of blood further down into the thigh as there was blood along the distal iliopsoas on that CT                         -add prn robaxin                         -order kpad                          -dopplers pending as above - 12/10: Per OT Max 2 OOB due to leg pain.  On exam,?  Meralgia paresthetica based on distribution, will add gabapentin 100 mg 3 times daily due to renal deficiency.  Increase oxycodone to 5 to 10 mg as needed.  Not  scheduled Tylenol due to elevated LFTs. -12/12: Episode of dizziness a.m., had taken a.m. oxycodone several hours prior.  Decrease as needed to 5 mg every 6 hours as needed.  DC gabapentin. 12-13: Dizziness not recurrent.  Start Robaxin 750 mg 3 times daily for ongoing thigh pain.  4. Mood/Behavior/Sleep: LCSW to evaluate and provide emotional support             -continue melatonin 10mg  (ordered as 5mg  PRN though?)             -antipsychotic agents: n/a -04/14/23 pt slept poorly, takes 5-10mg  ambien at home; d/t age, will start 5mg  dose PRN, advised he has to ask for it; melatonin PRN still in place, for if [place] doesn't help.    5. Neuropsych/cognition: This patient is capable of making decisions on his own behalf. - 12/10: SLP consult for reported cognitive deficits; may be medication induced per wife  6. Skin/Wound Care: Routine skin care checks   - Stage 2 to sacrum reported-now unstageable 12-11: Unstageable found on right heel.  Added Medihoney to heel and low back, changed to low-air-loss mattress - WOCN consulted 12/11 for recs   - Lateral trunk rash managed with steroid creams essentially resolved   -12-13: Left antecubital IV infiltration.  Ice and compression to area.  7. Fluids/Electrolytes/Nutrition/hyponatremia: Strict Is and Os and follow-up chemistries   - 12/10: Fluid restriction for mild hyponatremia today.  Repeat tomorrow a.m. - 12/11: Na 129 today; was not compliant with fluid restriction yesterday. Continue today and repeat in AM tomorrow; if no improvement will need to start  gentle IVF. Add 40 meq daily potassium.  12-12: NA 131, improved.  Potassium normalized.  DC supplementation of potassium given all priors were normal on same dose of torsemide.  04/13/23: NA 130, BUN/creatinine stable, same as yesterday.  Monitor   8: Hypertension: monitor TID and prn (Entresto, torsemide held)>> still on torsemide 20mg  daily             -currently on midodrine 10 mg TID for  hypotension -12-12: Slightly hypotensive, on fluid restriction with diuresis, would continue midodrine for now.  Questionable orthostatic episode today, symptoms more vertiginous, treatment as below.  12-13: BP improved, no further symptoms orthostasis, monitor  -12/14-15/24 BPs great, monitor Vitals:   04/10/23 1318 04/10/23 1925 04/11/23 0601 04/11/23 1200  BP: (!) 126/57 95/70 (!) 118/58 139/74   04/11/23 2007 04/12/23 0605 04/12/23 1320 04/12/23 1919  BP: (!) 112/57 119/68 122/68 113/60   04/13/23 0449 04/13/23 1333 04/13/23 1913 04/14/23 0243  BP: 117/69 123/62 120/62 131/73     9:  Cardiorenal syndrome/acute kidney Injury on chronic kidney disease stage IIIb (baseline creatinine around 2)              -milrinone discontinued 12/08             -no indication for HD             -daily weight             -continue sodium bicarbonate 650 mg BID             -continue torsemide 20 mg daily  12/10-14: Renal function appears stable.   -04/14/23 Cr up to 2.25 today, baseline is 2 so will just encourage PO intake for now, but monitor closely   10: Fall/right retroperitoneal hematoma: now back on Eliquis; H and H stable             -follow-up CBC - stable   11: Atypical atrial flutter; hx of pAF: rate controlled (low 100s)             -on Eliquis 5mg  BID, amiodarone 200mg  daily   12: COPD: former tobacco smoker, quit 2018   13: DM-2: CBGs QID; A1c = 6.9% (home on Ozempic; metformin discontinued due to CKD)             -continue SSI  Blood sugars have remained relatively stable -04/13/23 CBGs variable, many >200, would probably benefit from some optimization, but defer to weekday team to discuss options -04/14/23 CBGs a bit better but still might benefit from further optimization Recent Labs    04/13/23 2046 04/14/23 0556 04/14/23 1124  GLUCAP 182* 158* 202*       14: CAD: s/p 2-vessel CABG 2018             -restart statin once LFTs normalized             -follow-up CMP Mon  12/16   15: Acute on chronic systolic and diastolic heart failure, 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  I's and O's remain significantly positive.  Added TED hose for bilateral lower extremity edema; weights remain stable, no symptoms of volume overload. 12/12-14: Weights stable. Can't tolerate TEDs, advised we could ACE wrap his RLE to help with  the swelling; pt will consider it [facility] Weights   04/10/23 0634 04/13/23 0544 04/14/23 0530  Weight: 89.8 kg 89.7 kg 89.7 kg      16: Elevated transaminases, up trending: ? due to congestive hepatopathy             -follow-up CMP-downtrending, but persistent, f/up Monday   17: Multiple falls; AC joint injury/possible right distal clavicular fracture 11/05   18: Possible pneumonia: continue Augmentin to finish the course   - 12/10:  Repeat chest x-ray without consolidation  - 12/11: Augmentin stopped per ID recommendations  19: Leukocytosis, chronic: negative infectious work-up except possible PNA; had short course steroids for skin rash             -appears to be off prednisone now             -on Augmentin (end 12/11)             -follow-up CBC with diff on 12/10 12/10: Uptrending leukocytosis 28.3 -> 37.8 -> 43.7 despite stopping p.o. steroids on 12-7.  Extensive inpatient workup for leukocytosis described in H&P.  Does have chronic underlying leukocytosis in the 20s.  Will consult infectious disease for additional workup/ideas today, feel less likely reactive given that thrombocytosis is improving and other parameters remained stable.  12/11: Mildly decreased to 35 today. Remains afebrile. ID consulted yesterday, recommending DC augmentin, daily monitorring, and drainage of psoas fluid collection if no improvement. Appreciate their insight and recommendations. 12-12: WBC back up to 40,  thrombocytosis mildly uptrending.  Afebrile, per infectious disease aspiration of right hip fluid performed and cultures pending.  Appreciate their recommendations and management. 12-13: 300 cc hip aspirate with multiple WBCs and PMNs.  Patient started on vancomycin per infectious disease.  Monitor for final culture results.-- still incubating 12/14  20: Lung nodule: follow-up outpatient with PCP   21: Hx of bladder cancer s/p TURP 2013   22: Skin rash treated with prednisone/hydroxyzine/calamine lotion             -appears prednisone has been discontinued-rash essentially resolved.   23: Chronic anemia: H and H stable times 72 hours             -follow-up CBC-stable 12/14   24: Thrombocytosis; fluctuating: follow-up CBC.  Ongoing..   25. Vertigo/dizziness.  Started 12-12, no nystagmus on exam. -orthostatic vitals every morning -Added as needed meclizine 12.5 mg twice daily -Possible symptoms are secondary to medication burden, would wean down pain medications as tolerated -12-13: Improved,  LOS: 6 days A FACE TO FACE EVALUATION WAS PERFORMED  [address] 04/14/2023, 12:04 PM

## 2023-04-14 NOTE — Progress Notes (Signed)
Physical Therapy Session Note  Patient Details  Name: [name] [name] MRN: [id number] Date of Birth: 14-Aug-1945  Today's Date: 04/14/2023 PT Individual Time: 1015-1050 PT Individual Time Calculation (min): 35 min   Short Term Goals: Week 1:  PT Short Term Goal 1 (Week 1): Pt will complete sit<>stand with min assist PT Short Term Goal 2 (Week 1): Pt will complete bed mobility with min assist with LRAD PT Short Term Goal 3 (Week 1): Pt will ambulate 55' with min assist and LRAD PT Short Term Goal 4 (Week 1): Pt will complete up/down 1 step with BHRs mod assist  Skilled Therapeutic Interventions/Progress Updates:  Pt was seen bedside in the am fatigued from OT treatment this am. Pt willing to participate with therapy as tolerated. Pt c/o 7/10 R LE pain, medicated by nursing. Pt performed B LEs exercises, heel slides, hip abd/add, SAQs, 2 sets x 10 reps each. Pt transferred supine to edge of bed with mod A and verbal cues. Pt tolerated edge of bed about 10 minutes with S. While on edge of bed, pt performed blocked practice, partial stands, 3 sets x 5 reps each. Pt returned to supine with mod A and verbal cues. Pt moved up in bed with mod A. Pt too fatigued to participate further, treatment ended. Pt left sitting up in bed with call bell within reach.   Therapy Documentation Precautions:  Precautions Precautions: Fall Precaution Comments: watch BP and HR Restrictions Weight Bearing Restrictions Per Provider Order: No RUE Weight Bearing Per Provider Order: Weight bearing as tolerated General: PT Amount of Missed Time (min): 10 Minutes PT Missed Treatment Reason: Patient fatigue Vital Signs:   Pain: Pain Assessment Pain Scale: 0-10 Pain Score: 7  Pain Type: Acute pain Pain Location: Back Pain Descriptors / Indicators: Aching Pain Frequency: Constant Pain Onset: On-going Pain Intervention(s): Medication (See eMAR)   Therapy/Group: Individual Therapy  [name] 04/14/2023, 12:13 PM

## 2023-04-14 NOTE — Progress Notes (Signed)
Physical Therapy Session Note  Patient Details  Name: [name] [name] MRN: [id number] Date of Birth: [birth year]  Today's Date: 04/14/2023 PT Individual Time: 1302-1412 PT Individual Time Calculation (min): 70 min   Short Term Goals: Week 1:  PT Short Term Goal 1 (Week 1): Pt will complete sit<>stand with min assist PT Short Term Goal 2 (Week 1): Pt will complete bed mobility with min assist with LRAD PT Short Term Goal 3 (Week 1): Pt will ambulate 25' with min assist and LRAD PT Short Term Goal 4 (Week 1): Pt will complete up/down 1 step with BHRs mod assist  Skilled Therapeutic Interventions/Progress Updates:  Pt was seen bedside in the pm. Pt transferred supine to edge of bed with min to mod A and verbal cues. Pt maintained balance on edge of bed with S. Pt transferred sit to stand and pivoted to w/c with rolling walker and min to mod A with verbal cues. Pt transported to rehab gym. Pt transferred sit to stand with rolling walker and min A with verbal cues. Pt c/o dizziness and returned to w/c. BP 113/86 with HR 112. After a rest period, dizziness subsided and pt willing to try and stand again. Pt stood with rolling walker and min to mod A. Pt c/o dizziness again and returned to w/c. BP seated in w/c after standing 87/72, retake in w/c was 97/63. Pt continued to c/o dizziness which slowly improved with rest in w/c. Pt returned to room. Notified pt's nurse of orthostasis with standing. Pt transferred w/c to edge of bed with min to mod A and rolling walker. P's BP after transfer on edge of bed was 75/58 with HR 113. Pt returned to supine with min to mod A. Pt moved up in bed with min A and verbal cues. Pt's BP resting in bed was 133/69 with HR 107. Notified pt's nurse and pt left sitting up in bed with call bell within reach.   Therapy Documentation Precautions:  Precautions Precautions: Fall Precaution Comments: watch BP and HR Restrictions Weight Bearing Restrictions Per Provider Order:  No RUE Weight Bearing Per Provider Order: Weight bearing as tolerated General:  Pain: No c/o pain  Therapy/Group: Individual Therapy  [name] 04/14/2023, 3:42 PM

## 2023-04-14 NOTE — Progress Notes (Signed)
Pharmacy Antibiotic Note  [name] [name] is a 77 y.o. male admitted to CIR on 04/08/23 and now with  concern for R hip infection .  Aspriation done 12/12 and showed WBCs and GPR on gram stain. Pharmacy has been consulted for Vancomycin dosing.  Prior antibiotics for possible pneumonia completed 12/10.  Day #2 Vancomycin, creatinine trended up some today; BUN trending down. Noted urinating fine, PO intake has been good.  On Torsemide 20 mg PO daily.  Plan: Continue Vancomycin 1gm IV q24hrs - 2nd maintenance dose due at 4pm today Goal AUC 400-550. Expected AUC 419 using SCr 1.84 > 500 using SCr 2.25 Will follow up renal function on 12/16. Follow up final culture results, ID input and antibiotic plans.   Height: 6\' 2"  (188 cm) Weight: 89.7 kg (197 lb 12 oz) IBW/kg (Calculated) : 82.2  Temp (24hrs), Avg:98.1 F (36.7 C), Min:97.7 F (36.5 C), Max:98.5 F (36.9 C)  Recent Labs  Lab 04/09/23 0801 04/10/23 0603 04/11/23 0629 04/12/23 0609 04/13/23 0528 04/14/23 0648  WBC 43.7* 35.2* 40.6* 33.9* 25.3*  --   CREATININE 1.93* 1.89* 1.95* 1.84* 1.84* 2.25*    Estimated Creatinine Clearance: 32 mL/min (A) (by C-G formula based on SCr of 2.25 mg/dL (H)).    Allergies  Allergen Reactions   Spironolactone Other (See Comments)    Painful gynecomastia   Semaglutide     Nausea and bloating   Tramadol Other (See Comments)    Sleep disturbance.  Apnea spells.   Empagliflozin Rash    Rash at the groin Jardiance   Niacin And Related Hives    Antimicrobials this admission: Ceftriaxone 11/26 >>11/30 Azithromycin 11/26 >>11/28 Zosyn 12/4 >>6 Augmentin 12/6>>12/10 Medihoney to wound 12/11>>12/12 Vancomycin 12/13 >>  Dose adjustments this admission: n/a  Microbiology results: 11/26 respiratory panel: negative 12/3 blood: negative 12/12 abscess R hip: few gram + rods, abundant WBCs, reincubated  Thank you for allowing pharmacy to be a part of this patient's care.  Dennie Fetters, RPh 04/14/2023 11:04 AM

## 2023-04-14 NOTE — Progress Notes (Addendum)
Physical Therapy reported patient had dizziness on his therapy session; Per PT bp was 110 sys ; standing 80 sys. Patient was not wearing teds and  order to have teds when OOB patient claims he does not like it because it is uncomfortable.  Per patient he has not been drinking water; encourage to drink more fluids; We'll monitor.

## 2023-04-14 NOTE — Progress Notes (Signed)
Occupational Therapy Session Note  Patient Details  Name: [name] [name] MRN: [id number] Date of Birth: [birth year]  Today's Date: 04/14/2023 OT Individual Time: [phone] OT Individual Time Calculation (min): 75 min    Short Term Goals: Week 1:  OT Short Term Goal 1 (Week 1): LB dressing with AE and min A OT Short Term Goal 2 (Week 1): Standing 2 min sink side with LRAD for grooming with min A OT Short Term Goal 3 (Week 1): Toilet/stall shower transfer with LRAD and min A OT Short Term Goal 4 (Week 1): UB self care seated sinkside with set up  Skilled Therapeutic Interventions/Progress Updates:    Patient resting in bed at the time of arrival.  Patient indicated that he was unable to rest and that he had a pain response of 8 on 0-10 associated with his sacral and R hip.  Nursing was present and indicated that medication had been administered to address his  pain response. The pt was able to transfer from bed LOF to the w/c with Min/ModA x3 secondary to buckling of his knee using the RW for additional balance.  The pt was transported to the sink area and was able to doff his over head shirt with SBA and he was able to come from sit to stand using the sink for additional balance by rocking forward 3x to get momentum for coming in to stand at ModAx1.  The pt was MaxA for doffing his underwear and pants. The pt was able to bathe his UB with s/u A and he was MaxA  for bathing his LB. The pt was able to donn her overhead shirt with s/uA and he was MaxA for LB task completion of his brief and pants. The pt was s/uA for hygiene care for  brushing his teeth and  he was MinA for brushing his hair.At the end of the session the pt was able to transfer back to bed LOF using the bed rail  and RW for coming from sit to stand for completing a stand pivot transfer with MinA and ModA for BLE placement onto the bed.  The pt was able to be positioned up in the bed with MaxAx2 .  At the end of the session, the call  light and bedside table were both within reach and all additional needs were addressed.   Therapy Documentation Precautions:  Precautions Precautions: Fall Precaution Comments: watch BP and HR Restrictions Weight Bearing Restrictions Per Provider Order: No RUE Weight Bearing Per Provider Order: Weight bearing as tolerated  Therapy/Group: Individual Therapy  Lavona Mound 04/14/2023, 12:30 PM

## 2023-04-15 DIAGNOSIS — R5381 Other malaise: Secondary | ICD-10-CM | POA: Diagnosis not present

## 2023-04-15 DIAGNOSIS — D72829 Elevated white blood cell count, unspecified: Secondary | ICD-10-CM | POA: Diagnosis not present

## 2023-04-15 LAB — CBC
HCT: 31 % — ABNORMAL LOW (ref 39.0–52.0)
Hemoglobin: 10.2 g/dL — ABNORMAL LOW (ref 13.0–17.0)
MCH: 26.1 pg (ref 26.0–34.0)
MCHC: 32.9 g/dL (ref 30.0–36.0)
MCV: 79.3 fL — ABNORMAL LOW (ref 80.0–100.0)
Platelets: 640 10*3/uL — ABNORMAL HIGH (ref 150–400)
RBC: 3.91 MIL/uL — ABNORMAL LOW (ref 4.22–5.81)
RDW: 19.5 % — ABNORMAL HIGH (ref 11.5–15.5)
WBC: 19.5 10*3/uL — ABNORMAL HIGH (ref 4.0–10.5)
nRBC: 0 % (ref 0.0–0.2)

## 2023-04-15 LAB — COMPREHENSIVE METABOLIC PANEL
ALT: 58 U/L — ABNORMAL HIGH (ref 0–44)
AST: 61 U/L — ABNORMAL HIGH (ref 15–41)
Albumin: 1.7 g/dL — ABNORMAL LOW (ref 3.5–5.0)
Alkaline Phosphatase: 116 U/L (ref 38–126)
Anion gap: 11 (ref 5–15)
BUN: 33 mg/dL — ABNORMAL HIGH (ref 8–23)
CO2: 26 mmol/L (ref 22–32)
Calcium: 7.9 mg/dL — ABNORMAL LOW (ref 8.9–10.3)
Chloride: 95 mmol/L — ABNORMAL LOW (ref 98–111)
Creatinine, Ser: 2 mg/dL — ABNORMAL HIGH (ref 0.61–1.24)
GFR, Estimated: 34 mL/min — ABNORMAL LOW (ref >60)
Glucose, Bld: 142 mg/dL — ABNORMAL HIGH (ref 70–99)
Potassium: 4.1 mmol/L (ref 3.5–5.1)
Sodium: 132 mmol/L — ABNORMAL LOW (ref 135–145)
Total Bilirubin: 1.4 mg/dL — ABNORMAL HIGH (ref <1.2)
Total Protein: 4.9 g/dL — ABNORMAL LOW (ref 6.5–8.1)

## 2023-04-15 LAB — GLUCOSE, CAPILLARY
Glucose-Capillary: 137 mg/dL — ABNORMAL HIGH (ref 70–99)
Glucose-Capillary: 184 mg/dL — ABNORMAL HIGH (ref 70–99)
Glucose-Capillary: 151 mg/dL — ABNORMAL HIGH (ref 70–99)
Glucose-Capillary: 140 mg/dL — ABNORMAL HIGH (ref 70–99)

## 2023-04-15 MED ORDER — ACETAMINOPHEN 500 MG PO TABS
1000.0000 mg | ORAL_TABLET | Freq: Three times a day (TID) | ORAL | Status: DC
Start: 2023-04-15 — End: 2023-04-20
  Administered 2023-04-15 – 2023-04-20 (×16): 1000 mg via ORAL
  Filled 2023-04-15 (×16): qty 2

## 2023-04-15 MED ORDER — MIDODRINE HCL 5 MG PO TABS
5.0000 mg | ORAL_TABLET | Freq: Three times a day (TID) | ORAL | Status: DC
Start: 2023-04-15 — End: 2023-04-16
  Administered 2023-04-15 – 2023-04-16 (×3): 5 mg via ORAL
  Filled 2023-04-15 (×3): qty 1

## 2023-04-15 NOTE — Plan of Care (Signed)
  Problem: RH Balance Goal: LTG Patient will maintain dynamic standing balance (PT) Description: LTG:  Patient will maintain dynamic standing balance with assistance during mobility activities (PT) Flowsheets (Taken 04/15/2023 1609) LTG: Pt will maintain dynamic standing balance during mobility activities with:: Contact Guard/Touching assist Note: Downgraded due to lack of progress   Problem: Sit to Stand Goal: LTG:  Patient will perform sit to stand with assistance level (PT) Description: LTG:  Patient will perform sit to stand with assistance level (PT) Flowsheets (Taken 04/15/2023 1609) LTG: PT will perform sit to stand in preparation for functional mobility with assistance level: Contact Guard/Touching assist Note: Downgraded due to lack of progress   Problem: RH Bed Mobility Goal: LTG Patient will perform bed mobility with assist (PT) Description: LTG: Patient will perform bed mobility with assistance, with/without cues (PT). Flowsheets (Taken 04/15/2023 1609) LTG: Pt will perform bed mobility with assistance level of: Minimal Assistance - Patient > 75% Note: Downgraded due to lack of progress   Problem: RH Bed to Chair Transfers Goal: LTG Patient will perform bed/chair transfers w/assist (PT) Description: LTG: Patient will perform bed to chair transfers with assistance (PT). Flowsheets (Taken 04/15/2023 1609) LTG: Pt will perform Bed to Chair Transfers with assistance level: Contact Guard/Touching assist Note: Downgraded due to lack of progress   Problem: RH Ambulation Goal: LTG Patient will ambulate in controlled environment (PT) Description: LTG: Patient will ambulate in a controlled environment, # of feet with assistance (PT). Flowsheets (Taken 04/15/2023 1609) LTG: Ambulation distance in controlled environment: 100' Note: Downgraded due to lack of progress Goal: LTG Patient will ambulate in home environment (PT) Description: LTG: Patient will ambulate in home environment,  # of feet with assistance (PT). Flowsheets (Taken 04/15/2023 1609) LTG: Pt will ambulate in home environ  assist needed:: Contact Guard/Touching assist Note: Downgraded due to lack of progress

## 2023-04-15 NOTE — Progress Notes (Signed)
Physical Therapy Session Note  Patient Details  Name: [name] [name] MRN: [id number] Date of Birth: 25-Aug-1945  Today's Date: 04/15/2023 PT Individual Time: 5621-3086 + 1304-1400 PT Individual Time Calculation (min): 42 min + 56 min  Short Term Goals: Week 1:  PT Short Term Goal 1 (Week 1): Pt will complete sit<>stand with min assist PT Short Term Goal 2 (Week 1): Pt will complete bed mobility with min assist with LRAD PT Short Term Goal 3 (Week 1): Pt will ambulate 40' with min assist and LRAD PT Short Term Goal 4 (Week 1): Pt will complete up/down 1 step with BHRs mod assist  Skilled Therapeutic Interventions/Progress Updates:    SESSION 1: Pt presents in room in bed, agreeable to PT. Pt reporting pain in RLE, premedicated. Pt stating feeling better having ace wraps on lower legs to assist with hypotension. Session focused on bed mobility, transfer training, and therapeutic exercise to promote BLE strengthening and ROM needed for functional transfers and ambulation.  Pt completes bed mobility with verbal cues, min assist for RLE off bed, mod assist for trunk to upright. Pt completes sit<>stand to RW with mod assist, therapist blocking R knee and assist for gluteal clearance, pt with poor anterior wt shift with translation secondary to poor tolerance to hip flexion. Pt completes stand step transfer with RW with min assist to WC.  Pt transported from room to day room for time management. Pt completes interval training on kinetron work/52min rest at 60 cm/sec workload for total 10 minutes to promote improved BP prior to upright mobility,  Vitals assessed following exercise with pt demonstrating BP 98/64, HR 117 bpm, asymptomatic.  Pt attempts sit to stand with max assist however demonstrating poor anterior wt shift for stand. Pt returns to sitting and completes x10 anterior wt shifts reaching towards feet to promote improved hip flexion ROM needed for functional transfers. Pt reporting  increased discomfort with hip flexion however demonstrating improved ROM with repetition.  Pt returns to room and remains seated in [facility] with all needs within reach, cal light in place and chair alarm donned and activated at end of session.   SESSION 2: Pt presents in room in bed, reporting fatigue but agreeable to PT. Pt reporting no pain in RLE with rest but pain increases with mobility. Pt states overall pain is down today compared to previous sessions. Session focused on gait training, transfer training and therapeutic exercise to promote improved BLE strengthening and tolerance to upright needed for functional mobility and ambulation.  Pt completes bed mobility with min assist for RLE mgmt, able to manage trunk to upright with min assist. Pt completes sit<>stand with min assist from elevated EOB and completes stand step transfer with min assist. Pt transported from room to main gym dependently for time management.  Pt completes sit<>stand from WC to RW with light mod assist improved anterior wt shift and power up. Pt ambulates with RW min assist with 15' with two turns, slight LOB to L with turn to R requiring min assist to correct, demonstrating step to gait pattern. Pt then ambulates 22' with RW, pt demonstrating decreased proximity to RW and noted R knee instability with R stance phase, cued for stepping into RW with RLE with improved knee stability noted, no buckling. Pt takes extended seated rest break then ambulates 10' back to [facility] with RW min assist.   Pt positioned in // bars and completes seated/standing therex to promote BLE strengthening: - standing RLE marches x10 -  standing hip abduction RLE x10 - seated alternating marches x20 - seated heel/toe raise x20  Pt reporting dizziness following 2nd stand that lingers in sitting. Pt transported back to room dependently in WC. Pt completes stand pivot transfer with RW mod assist back to bed, completes sit to supine hooking LLE under RLE with  mod assist and cues for repositioning in supine. Therapist doffs ace wraps while pt in supine and pt remains with RLE elevated, ankle floating, all needs within reach at end of session.   Therapy Documentation Precautions:  Precautions Precautions: Fall Precaution Comments: watch BP and HR Restrictions Weight Bearing Restrictions Per Provider Order: No RUE Weight Bearing Per Provider Order: Weight bearing as tolerated   Therapy/Group: Individual Therapy  Edwin Cap PT, DPT 04/15/2023, 12:22 PM

## 2023-04-15 NOTE — Progress Notes (Signed)
Occupational Therapy Weekly Progress Note  Patient Details  Name: [name] [name] MRN: [id number] Date of Birth: 1946-04-02  Beginning of progress report period: April 09, 2023 End of progress report period: April 15, 2023  Today's Date: 04/15/2023 OT Individual Time: 1100-1200 OT Individual Time Calculation (min): 60 min    Patient has met 1 of 4 short term goals. Pt limited by R LE pain, edema and poor activity tolerance and strength. Pt had been taken down for R hip fluid aspiration procedure during session planned for shower retraining and has not completed thus far. OT addressed sink side self care this visit and pt open for shower training next session. OT discussed potential for TTB and w/c and will need HHOT at home. Pt to discuss with wife TTB  as pt seems to have limited insight and reports the small shower stoll should suffice for d/c into step over threshold. OT pointed out currently pt unable to step into a stall shower.  Will continue to focus on pain management, activity tolerance, especially with standing, DME needs for d/c planning.   Patient continues to demonstrate the following deficits: muscle weakness and muscle joint tightness, decreased cardiorespiratoy endurance, impaired timing and sequencing, ataxia, and decreased coordination, delayed processing, and decreased sitting balance, decreased standing balance, decreased postural control, and decreased balance strategies and therefore will continue to benefit from skilled OT intervention to enhance overall performance with BADL, iADL, and Reduce care partner burden.  Patient progressing toward long term goals..  Continue plan of care.  OT Short Term Goals Week 1:  OT Short Term Goal 1 (Week 1): LB dressing with AE and min A OT Short Term Goal 1 - Progress (Week 1): Progressing toward goal OT Short Term Goal 2 (Week 1): Standing 2 min sink side with LRAD for grooming with min A OT Short Term Goal 2 - Progress (Week  1): Not met OT Short Term Goal 3 (Week 1): Toilet/stall shower transfer with LRAD and min A OT Short Term Goal 3 - Progress (Week 1): Progressing toward goal OT Short Term Goal 4 (Week 1): UB self care seated sinkside with set up OT Short Term Goal 4 - Progress (Week 1): Met Week 2:  OT Short Term Goal 1 (Week 2): LB dressing with AE and min A OT Short Term Goal 2 (Week 2): Standing 2 min sink side with LRAD for grooming with min A OT Short Term Goal 3 (Week 2): Toilet/stall shower transfer with LRAD and min A  Skilled Therapeutic Interventions/Progress Updates:  Pt seen this am for weekly reassessment and training session. Overall status as follows:  min A UB bath/dress, max A LB bath/dress, mod fading to min A SPT to commode and w/c, sink side seated grooming set up, unable to stand for longer than a few seconds with RW mainly limited by pain and poor mobility and OOB tolerance. Pt requested back to bed with SPT with RW with min A. Use of leg lifter encouraged with + results.   OT discussed DME for d/c and most likely will need a w/c and potentially TTB but pt wants to wait and speak with wife. HHOT rec.  Left with all safety needs and nurse call button and needs in reach.   Pain: 5/10 at start of session with reduction to 4/10 with rest, elevation and repositioning   Therapy Documentation Precautions:  Precautions Precautions: Fall Precaution Comments: watch BP and HR Restrictions Weight Bearing Restrictions Per Provider Order: No RUE Weight  Bearing Per Provider Order: Weight bearing as tolerated   Therapy/Group: Individual Therapy  Vicenta Dunning 04/15/2023, 7:53 AM

## 2023-04-15 NOTE — Progress Notes (Addendum)
[facility]  Date of Admission:  04/08/2023     Total days of antibiotics 16         ASSESSMENT:  Mr. Parady most recent WBC count continues to improve with most recent WBC count of 19.5. Microbiology has identified the organism as Lactobacillus which is intrinsically resistant to vancomycin indicating that there is likely no significant infection. Will discontinue vancomycin and continue to monitor WBC count for now.  Suspect no additional antibiotics needed at this point. Remaining medical and supportive care per Primary Team.   PLAN:  Discontinue vancomycin Continue to monitor cultures until finalized.  Remaining medical and supportive care per Primary Team.   Principal Problem:   Debility Active Problems:   Leukocytosis    acetaminophen  1,000 mg Oral TID   amiodarone  200 mg Oral Daily   apixaban  5 mg Oral BID   Gerhardt's butt cream   Topical BID   insulin aspart  0-15 Units Subcutaneous TID WC   insulin aspart  0-5 Units Subcutaneous QHS   leptospermum manuka honey  1 Application Topical Daily   methocarbamol  750 mg Oral TID   midodrine  5 mg Oral TID WC   senna-docusate  1 tablet Oral BID   sodium bicarbonate  650 mg Oral BID   torsemide  20 mg Oral Daily    SUBJECTIVE:  Afebrile overnight with no acute events. Feeling okay and tolerating antibiotics.   Allergies  Allergen Reactions   Spironolactone Other (See Comments)    Painful gynecomastia   Semaglutide     Nausea and bloating   Tramadol Other (See Comments)    Sleep disturbance.  Apnea spells.   Empagliflozin Rash    Rash at the groin Jardiance   Niacin And Related Hives     Review of Systems: Review of Systems  Constitutional:  Negative for chills, fever and weight loss.  Respiratory:  Negative for cough, shortness of breath and wheezing.   Cardiovascular:  Negative for chest pain and leg swelling.  Gastrointestinal:  Negative for abdominal pain,  constipation, diarrhea, nausea and vomiting.  Skin:  Negative for rash.      OBJECTIVE: Vitals:   04/14/23 1912 04/15/23 0515 04/15/23 0629 04/15/23 1403  BP: 129/66 121/69  132/75  Pulse: 94 (!) 102  (!) 109  Resp: 18 18  17   Temp: 98.1 F (36.7 C) 98.2 F (36.8 C)  (!) 97.3 F (36.3 C)  TempSrc: Oral Oral    SpO2: 97% 97%  95%  Weight:   89.6 kg   Height:       Body mass index is 25.36 kg/m.  Physical Exam Constitutional:      General: He is not in acute distress.    Appearance: He is well-developed.     Comments: Lying in bed with head of bed elevated; pleasant.   Cardiovascular:     Rate and Rhythm: Normal rate and regular rhythm.     Heart sounds: Normal heart sounds.  Pulmonary:     Effort: Pulmonary effort is normal.     Breath sounds: Normal breath sounds.  Musculoskeletal:     Comments: Bilateral legs wrapped to assist with edema.   Skin:    General: Skin is warm and dry.  Neurological:     Mental Status: He is alert.     Lab Results Lab Results  Component Value Date   WBC 19.5 (H) 04/15/2023   HGB 10.2 (L) 04/15/2023  HCT 31.0 (L) 04/15/2023   MCV 79.3 (L) 04/15/2023   PLT 640 (H) 04/15/2023    Lab Results  Component Value Date   CREATININE 2.00 (H) 04/15/2023   BUN 33 (H) 04/15/2023   NA 132 (L) 04/15/2023   K 4.1 04/15/2023   CL 95 (L) 04/15/2023   CO2 26 04/15/2023    Lab Results  Component Value Date   ALT 58 (H) 04/15/2023   AST 61 (H) 04/15/2023   ALKPHOS 116 04/15/2023   BILITOT 1.4 (H) 04/15/2023     Microbiology: Recent Results (from the past 240 hours)  Aerobic/Anaerobic Culture w Gram Stain (surgical/deep wound)     Status: None (Preliminary result)   Collection Time: 04/11/23  1:39 PM   Specimen: Abscess  Result Value Ref Range Status   Specimen Description ABSCESS  Final   Special Requests right hip  Final   Gram Stain   Final    ABUNDANT WBC PRESENT, PREDOMINANTLY PMN FEW GRAM POSITIVE RODS Performed at [facility], [address] [place], [place] [address]    Culture   Final    CULTURE REINCUBATED FOR BETTER GROWTH NO ANAEROBES ISOLATED; CULTURE IN PROGRESS FOR 5 DAYS    Report Status PENDING  Incomplete     Marcos Eke, NP [facility]  04/15/2023  2:49 PM

## 2023-04-15 NOTE — Progress Notes (Signed)
Physical Therapy Session Note  Patient Details  Name: [name] [name] MRN: [id number] Date of Birth: 19-Jul-1945  Today's Date: 04/15/2023 PT Individual Time: 1000-1030 PT Individual Time Calculation (min): 30 min   Short Term Goals: Week 1:  PT Short Term Goal 1 (Week 1): Pt will complete sit<>stand with min assist PT Short Term Goal 2 (Week 1): Pt will complete bed mobility with min assist with LRAD PT Short Term Goal 3 (Week 1): Pt will ambulate 85' with min assist and LRAD PT Short Term Goal 4 (Week 1): Pt will complete up/down 1 step with BHRs mod assist  Skilled Therapeutic Interventions/Progress Updates:      Therapy Documentation Precautions:  Precautions Precautions: Fall Precaution Comments: watch BP and HR Restrictions Weight Bearing Restrictions Per Provider Order: No RUE Weight Bearing Per Provider Order: Weight bearing as tolerated  Pt agreeable to PT session with emphasis on UE global strength training per patient request. Pt with unrated R LE pain, provided rest/repositioning for relief. Pt dependently transported by w/c to main gym and performed following UE exercises with cues for technique with 3.3 # weighted ball:   -OH press 2 x 8   -chest press 2 x 8   -OH + chest press 3 x 5   Pt returned to room, denied dizziness (ace wrapped bilateral LE's prior to session) and mod A with stand pivot and sit to lying. Pt left semi-reclined with all needs in reach.     Therapy/Group: Individual Therapy  Truitt Leep Truitt Leep PT, DPT  04/15/2023, 10:10 AM

## 2023-04-15 NOTE — Progress Notes (Signed)
PROGRESS NOTE   Subjective/Complaints:  No events overnight.  Left hip pain remains persistent.  Patient has not appreciated much of an improvement since starting Robaxin.  Nursing reports severe anxiety, was resumed on home medications yesterday but states that he is having panic attacks regularly.  Request to be started on some standing medication.  Labs appear to be improving, LFTs have mostly normalized, and WBCs are coming down.  Vital stable, blood pressure has improved slightly through the weekend.  ROS: Right lower extremity lateral thigh burning pain, aching, spasming-ongoing. +vertigo -not recurrent today, +insomnia-improved with medication  denies fevers, chills, N/V, abdominal pain, constipation, diarrhea, SOB, cough, chest pain, new weakness or paraesthesias.    Objective:   No results found.  Recent Labs    04/13/23 0528 04/15/23 0535  WBC 25.3* 19.5*  HGB 10.2* 10.2*  HCT 29.6* 31.0*  PLT 568* 640*   Recent Labs    04/14/23 0648 04/15/23 0535  NA 133* 132*  K 3.9 4.1  CL 96* 95*  CO2 28 26  GLUCOSE 160* 142*  BUN 33* 33*  CREATININE 2.25* 2.00*  CALCIUM 7.8* 7.9*    Intake/Output Summary (Last 24 hours) at 04/15/2023 0829 Last data filed at 04/15/2023 0753 Gross per 24 hour  Intake 473 ml  Output --  Net 473 ml      Pressure Injury 04/08/23 Ankle Posterior;Right Stage 2 -  Partial thickness loss of dermis presenting as a shallow open injury with a red, pink wound bed without slough. unattached edges scant drainage. This LDA replaces one from 03/03/23 that was entered (Active)  04/08/23 1730  Location: Ankle  Location Orientation: Posterior;Right  Staging: Stage 2 -  Partial thickness loss of dermis presenting as a shallow open injury with a red, pink wound bed without slough.  Wound Description (Comments): unattached edges scant drainage. This LDA replaces one from 03/03/23 that was entered  inaccurately as a blister that had opened.  Present on Admission: Yes     Pressure Injury 04/14/23 Heel Left;Lateral Deep Tissue Pressure Injury - Purple or maroon localized area of discolored intact skin or blood-filled blister due to damage of underlying soft tissue from pressure and/or shear. (Active)  04/14/23 1010  Location: Heel  Location Orientation: Left;Lateral  Staging: Deep Tissue Pressure Injury - Purple or maroon localized area of discolored intact skin or blood-filled blister due to damage of underlying soft tissue from pressure and/or shear.  Wound Description (Comments):   Present on Admission: No    Physical Exam: Vital Signs Blood pressure 121/69, pulse (!) 102, temperature 98.2 F (36.8 C), temperature source Oral, resp. rate 18, height 6\' 2"  (1.88 m), weight 89.6 kg, SpO2 97%.   PE: Constitution: Appropriate appearance for age. No apparent distress.  Laying in bed. HEENT: NCAT.  EOMI.  PERRLA.  Glasses donned. Resp: No respiratory distress. No accessory muscle usage. on RA and CTAB Cardio: Well perfused appearance.  No further pitting edema in the right lower extremity -bilateral legs wrapped in Ace bandages. Abdomen: Nondistended. Nontender.  Nondistended.  Positive bowel sounds, normoactive. Psych: Appropriate mood and affect.  Mildly anxious. Skin:  - Mepilex dressing to right heel, sacral heart  to low back for DTI's, unstageable-not examined -Left antecubital fossa erythema surrounding prior IV site; no warmth, fluctuance, or drainage.-Resolved  Neurologic Exam:   DTRs: Reflexes were 2+ in bilateral achilles, patella Sensory exam: revealed normal sensation in all dermatomal regions in bilateral upper and lower extremities Motor exam: Antigravity and against resistance in all 4 extremities coordination: Fine motor coordination was normal.             Assessment/Plan: 1. Functional deficits which require 3+ hours per day of interdisciplinary therapy in  a comprehensive inpatient rehab setting. Physiatrist is providing close team supervision and 24 hour management of active medical problems listed below. Physiatrist and rehab team continue to assess barriers to discharge/monitor patient progress toward functional and medical goals  Care Tool:  Bathing    Body parts bathed by patient: Right arm, Left arm, Chest, Face, Abdomen   Body parts bathed by helper: Front perineal area, Right lower leg, Left upper leg, Right upper leg, Buttocks, Left lower leg     Bathing assist Assist Level: Total Assistance - Patient < 25%     Upper Body Dressing/Undressing Upper body dressing   What is the patient wearing?: Pull over shirt    Upper body assist Assist Level: Contact Guard/Touching assist    Lower Body Dressing/Undressing Lower body dressing      What is the patient wearing?: Pants     Lower body assist Assist for lower body dressing: Total Assistance - Patient < 25%     Toileting Toileting    Toileting assist Assist for toileting: Maximal Assistance - Patient 25 - 49%     Transfers Chair/bed transfer  Transfers assist  Chair/bed transfer activity did not occur: Safety/medical concerns (unsafe to get up)  Chair/bed transfer assist level: Moderate Assistance - Patient 50 - 74%     Locomotion Ambulation   Ambulation assist   Ambulation activity did not occur: Safety/medical concerns (amb 8' in //bars with min assist and +2 for WC follow)          Walk 10 feet activity   Assist  Walk 10 feet activity did not occur: Safety/medical concerns        Walk 50 feet activity   Assist Walk 50 feet with 2 turns activity did not occur: Safety/medical concerns         Walk 150 feet activity   Assist Walk 150 feet activity did not occur: Safety/medical concerns         Walk 10 feet on uneven surface  activity   Assist Walk 10 feet on uneven surfaces activity did not occur: Safety/medical concerns          Wheelchair     Assist Is the patient using a wheelchair?: Yes Type of Wheelchair: Manual    Wheelchair assist level: Contact Guard/Touching assist Max wheelchair distance: 47'    Wheelchair 50 feet with 2 turns activity    Assist        Assist Level: Contact Guard/Touching assist   Wheelchair 150 feet activity     Assist      Assist Level: Moderate Assistance - Patient 50 - 74%   Blood pressure 121/69, pulse (!) 102, temperature 98.2 F (36.8 C), temperature source Oral, resp. rate 18, height 6\' 2"  (1.88 m), weight 89.6 kg, SpO2 97%.  Medical Problem List and Plan: 1. Functional deficits secondary to retroperitoneal hematoma after recent fall.             -also right right [facility]  jt injury, ?clavicle fx             -patient may  shower  -Stable to continue CIR             -ELOS/Goals: 10-12 days, supervision goals with PT, OT - 12/23 DC date  - 12/10: Family installing chair lift. Has HH in place. Functionally limited by RLE pain.    2.  Antithrombotics: -DVT/anticoagulation:  Pharmaceutical: Eliquis 5mg  BID             -antiplatelet therapy: none             -pt with new right lower extremity edema and thigh pain. Dopplers pending                         -eliquis recently resumed, so findings won't change medical mgt 3. Pain Management: Tylenol, oxycodone as needed for hip  -right thigh pain. ?muscle strain. No sensory findings. Most recent CT reviewed.  It's possible he's had further migration of blood further down into the thigh as there was blood along the distal iliopsoas on that CT                         -add prn robaxin                         -order kpad                          -dopplers pending as above - 12/10: Per OT Max 2 OOB due to leg pain.  On exam,?  Meralgia paresthetica based on distribution, will add gabapentin 100 mg 3 times daily due to renal deficiency.  Increase oxycodone to 5 to 10 mg as needed.  Not scheduled Tylenol due to  elevated LFTs. -12/12: Episode of dizziness a.m., had taken a.m. oxycodone several hours prior.  Decrease as needed to 5 mg every 6 hours as needed.  DC gabapentin. 12-13: Dizziness not recurrent.  Start Robaxin 750 mg 3 times daily for ongoing thigh pain. 12-16: LFTs essentially normalized.  Order standing Tylenol 1000 mg 3 times daily  4. Mood/Behavior/Sleep: LCSW to evaluate and provide emotional support             -continue melatonin 10mg  (ordered as 5mg  PRN though?)             -antipsychotic agents: n/a -04/14/23 pt slept poorly, takes 5-10mg  ambien at home; d/t age, will start 5mg  dose PRN, advised he has to ask for it; melatonin PRN still in place, for if ambien doesn't help.  12-16: Used both PRNs yesterday.  Still having panic attacks and severe anxiety per nursing.  Order standing BuSpar 5 mg 3 times daily.  Would benefit from neuropsych consult   5. Neuropsych/cognition: This patient is capable of making decisions on his own behalf. - 12/10: SLP consult for reported cognitive deficits; may be medication induced per wife  6. Skin/Wound Care: Routine skin care checks   - Stage 2 to sacrum reported-now unstageable 12-11: Unstageable found on right heel.  Added Medihoney to heel and low back, changed to low-air-loss mattress - WOCN consulted 12/11 for recs   - Lateral trunk rash managed with steroid creams essentially resolved   -12-13: Left antecubital IV infiltration.  Ice and compression to area.-Much improved  7. Fluids/Electrolytes/Nutrition/hyponatremia: Strict Is and Os and follow-up chemistries   -  12/10: Fluid restriction for mild hyponatremia today.  Repeat tomorrow a.m. - 12/11: Na 129 today; was not compliant with fluid restriction yesterday. Continue today and repeat in AM tomorrow; if no improvement will need to start gentle IVF. Add 40 meq daily potassium.  12-12: NA 131, improved.  Potassium normalized.  DC supplementation of potassium given all priors were normal on  same dose of torsemide.  04/13/23: NA 130, BUN/creatinine stable, same as yesterday.  Monitor 12-16: Labs stable  8: Hypertension: monitor TID and prn (Entresto, torsemide held)>> still on torsemide 20mg  daily             -currently on midodrine 10 mg TID for hypotension -12-12: Slightly hypotensive, on fluid restriction with diuresis, would continue midodrine for now.  Questionable orthostatic episode today, symptoms more vertiginous, treatment as below.  12-13: BP improved, no further symptoms orthostasis, monitor  -12/14-15/24 BPs great, monitor  12-16: Decrease midodrine to 5 mg twice daily.  Wean gradually over the next few days if tolerated.  Has TED hose. Vitals:   04/11/23 1200 04/11/23 2007 04/12/23 0605 04/12/23 1320  BP: 139/74 (!) 112/57 119/68 122/68   04/12/23 1919 04/13/23 0449 04/13/23 1333 04/13/23 1913  BP: 113/60 117/69 123/62 120/62   04/14/23 0243 04/14/23 1314 04/14/23 1912 04/15/23 0515  BP: 131/73 (!) 114/57 129/66 121/69     9:  Cardiorenal syndrome/acute kidney Injury on chronic kidney disease stage IIIb (baseline creatinine around 2)              -milrinone discontinued 12/08             -no indication for HD             -daily weight             -continue sodium bicarbonate 650 mg BID             -continue torsemide 20 mg daily  12/10-14: Renal function appears stable.   -04/14/23 Cr up to 2.25 today, baseline is 2 so will just encourage PO intake for now, but monitor closely 12-16: Creatinine down to 2.0.  Back to baseline.    10: Fall/right retroperitoneal hematoma: now back on Eliquis; H and H stable             -follow-up CBC - stable   11: Atypical atrial flutter; hx of pAF: rate controlled (low 100s)             -on Eliquis 5mg  BID, amiodarone 200mg  daily   12: COPD: former tobacco smoker, quit 2018   13: DM-2: CBGs QID; A1c = 6.9% (home on Ozempic; metformin discontinued due to CKD)             -continue SSI  Blood sugars have remained  relatively stable -04/13/23 CBGs variable, many >200, would probably benefit from some optimization, but defer to weekday team to discuss options -04/14/23 CBGs a bit better but still might benefit from further optimization Recent Labs    04/14/23 1626 04/14/23 2101 04/15/23 0533  GLUCAP 142* 135* 137*       14: CAD: s/p 2-vessel CABG 2018             -restart statin once LFTs normalized             -follow-up CMP Mon 12/16   15: Acute on chronic systolic and diastolic heart failure, 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  I's and O's remain significantly positive.  Added TED hose for bilateral lower extremity edema; weights remain stable, no symptoms of volume overload. 12/12-16: Weights stable. Can't tolerate TEDs, advised we could ACE wrap his RLE to help with the swelling; pt will consider it [facility] Weights   04/13/23 0544 04/14/23 0530 04/15/23 0629  Weight: 89.7 kg 89.7 kg 89.6 kg      16: Elevated transaminases, up trending: ? due to congestive hepatopathy             -follow-up CMP-downtrending, but persistent, f/up Monday   17: Multiple falls; AC joint injury/possible right distal clavicular fracture 11/05   18: Possible pneumonia: continue Augmentin to finish the course   - 12/10:  Repeat chest x-ray without consolidation  - 12/11: Augmentin stopped per ID recommendations  19: Leukocytosis, chronic: negative infectious work-up except possible PNA; had short course steroids for skin rash             -appears to be off prednisone now             -on Augmentin (end 12/11)             -follow-up CBC with diff on 12/10 12/10: Uptrending leukocytosis 28.3 -> 37.8 -> 43.7 despite stopping p.o. steroids on 12-7.  Extensive inpatient workup for leukocytosis described in H&P.  Does have chronic underlying leukocytosis in the 20s.   Will consult infectious disease for additional workup/ideas today, feel less likely reactive given that thrombocytosis is improving and other parameters remained stable.  12/11: Mildly decreased to 35 today. Remains afebrile. ID consulted yesterday, recommending DC augmentin, daily monitorring, and drainage of psoas fluid collection if no improvement. Appreciate their insight and recommendations. 12-12: WBC back up to 40, thrombocytosis mildly uptrending.  Afebrile, per infectious disease aspiration of right hip fluid performed and cultures pending.  Appreciate their recommendations and management. 12-13: 300 cc hip aspirate with multiple WBCs and PMNs.  Patient started on vancomycin per infectious disease.  Monitor for final culture results.--Lactobacillus contaminant 12-16: Culture grew contaminant as above.  WBC is downtrending.  Stop vancomycin, repeat CBC every 72 hours and reconsult ID as needed.  Very much appreciate their assistance in the management of this patient.  20: Lung nodule: follow-up outpatient with PCP   21: Hx of bladder cancer s/p TURP 2013   22: Skin rash treated with prednisone/hydroxyzine/calamine lotion             -appears prednisone has been discontinued-rash essentially resolved.   23: Chronic anemia: H and H stable times 72 hours             -follow-up CBC-stable 12/14   24: Thrombocytosis; fluctuating: follow-up CBC.  Ongoing..   25. Vertigo/dizziness.  Started 12-12, no nystagmus on exam. -orthostatic vitals every morning -Added as needed meclizine 12.5 mg twice daily -Possible symptoms are secondary to medication burden, would wean down pain medications as tolerated -12-13: Improved, no recurrent symptoms endorsed  LOS: 7 days A FACE TO FACE EVALUATION WAS PERFORMED  Angelina Sheriff 04/15/2023, 8:29 AM

## 2023-04-15 NOTE — Progress Notes (Signed)
Patient refused prevalon boot. Nurse education patient on the benefits of wearing while in bed. Patient content on floating right heel with pillow

## 2023-04-15 NOTE — Progress Notes (Signed)
Occupational Therapy Session Note  Patient Details  Name: [name] [name] MRN: [id number] Date of Birth: 13-Nov-1945  Today's Date: 04/15/2023 OT Individual Time: 8182-9937 OT Individual Time Calculation (min): 18 min  Missed minutes: -12 minutes d/t nursing care, medication and nurse putting pt on bed pan   Short Term Goals: Week 1:  OT Short Term Goal 1 (Week 1): LB dressing with AE and min A OT Short Term Goal 2 (Week 1): Standing 2 min sink side with LRAD for grooming with min A OT Short Term Goal 3 (Week 1): Toilet/stall shower transfer with LRAD and min A OT Short Term Goal 4 (Week 1): UB self care seated sinkside with set up  Skilled Therapeutic Interventions/Progress Updates:      Therapy Documentation Precautions:  Precautions Precautions: Fall Precaution Comments: watch BP and HR Restrictions Weight Bearing Restrictions Per Provider Order: No RUE Weight Bearing Per Provider Order: Weight bearing as tolerated General: "Nice to meet you." Pt supine in bed on bed pan upon OT arrival, agreeable to OT session.  Pain: unrated pain reported in RLE, activity, intermittent rest breaks, distractions provided for pain management, pt reports tolerable to proceed.   ADL: Toileting: Mod A on bed pan, OT completing hygiene after BM, pt able to assist with pulling pants over hips using bridging technique  Other Treatments: OT educated pt on ACE wrapping and purpose. OT ACE wrapped BLE total A in order to improve circulation and BP.    Pt supine in bed with bed alarm activated, 2 bed rails up, call light within reach and 4Ps assessed.   Therapy/Group: Individual Therapy  Velia Meyer, OTD, OTR/L 04/15/2023, 12:37 PM

## 2023-04-16 DIAGNOSIS — R5381 Other malaise: Secondary | ICD-10-CM | POA: Diagnosis not present

## 2023-04-16 LAB — GLUCOSE, CAPILLARY
Glucose-Capillary: 120 mg/dL — ABNORMAL HIGH (ref 70–99)
Glucose-Capillary: 187 mg/dL — ABNORMAL HIGH (ref 70–99)
Glucose-Capillary: 226 mg/dL — ABNORMAL HIGH (ref 70–99)
Glucose-Capillary: 123 mg/dL — ABNORMAL HIGH (ref 70–99)

## 2023-04-16 LAB — AEROBIC/ANAEROBIC CULTURE W GRAM STAIN (SURGICAL/DEEP WOUND): Culture: NO GROWTH — AB

## 2023-04-16 MED ORDER — ATORVASTATIN CALCIUM 80 MG PO TABS
80.0000 mg | ORAL_TABLET | Freq: Every day | ORAL | Status: DC
  Administered 2023-04-17 – 2023-04-20 (×4): 80 mg via ORAL
  Filled 2023-04-16 (×4): qty 1

## 2023-04-16 MED ORDER — MIDODRINE HCL 5 MG PO TABS
7.5000 mg | ORAL_TABLET | Freq: Three times a day (TID) | ORAL | Status: DC
  Administered 2023-04-16 – 2023-04-19 (×9): 7.5 mg via ORAL
  Filled 2023-04-16 (×10): qty 2

## 2023-04-16 NOTE — Progress Notes (Signed)
PROGRESS NOTE   Subjective/Complaints:  No events overnight.  Per therapies, very limited due to anxiety and deconditioning, some orthostasis with weightbearing.  Patient seen in therapy gym today, states that his right hip is hurting a little bit less.  Does not always have orthostasis when he stands, but will usually get towards the end of the session when he is about to sit down.  Otherwise, no concerns, no complaints.   ROS: Right lower extremity lateral thigh burning pain, aching, spasming-improving. +vertigo/dizziness/orthostasis-improving, +insomnia-improved with medication  denies fevers, chills, N/V, abdominal pain, constipation, diarrhea, SOB, cough, chest pain, new weakness or paraesthesias.    Objective:   No results found.  Recent Labs    04/15/23 0535  WBC 19.5*  HGB 10.2*  HCT 31.0*  PLT 640*   Recent Labs    04/14/23 0648 04/15/23 0535  NA 133* 132*  K 3.9 4.1  CL 96* 95*  CO2 28 26  GLUCOSE 160* 142*  BUN 33* 33*  CREATININE 2.25* 2.00*  CALCIUM 7.8* 7.9*    Intake/Output Summary (Last 24 hours) at 04/16/2023 1017 Last data filed at 04/16/2023 1914 Gross per 24 hour  Intake 480 ml  Output 375 ml  Net 105 ml      Pressure Injury 04/08/23 Ankle Posterior;Right Stage 2 -  Partial thickness loss of dermis presenting as a shallow open injury with a red, pink wound bed without slough. unattached edges scant drainage. This LDA replaces one from 03/03/23 that was entered (Active)  04/08/23 1730  Location: Ankle  Location Orientation: Posterior;Right  Staging: Stage 2 -  Partial thickness loss of dermis presenting as a shallow open injury with a red, pink wound bed without slough.  Wound Description (Comments): unattached edges scant drainage. This LDA replaces one from 03/03/23 that was entered inaccurately as a blister that had opened.  Present on Admission: Yes     Pressure Injury 04/14/23 Heel  Left;Lateral Deep Tissue Pressure Injury - Purple or maroon localized area of discolored intact skin or blood-filled blister due to damage of underlying soft tissue from pressure and/or shear. (Active)  04/14/23 1010  Location: Heel  Location Orientation: Left;Lateral  Staging: Deep Tissue Pressure Injury - Purple or maroon localized area of discolored intact skin or blood-filled blister due to damage of underlying soft tissue from pressure and/or shear.  Wound Description (Comments):   Present on Admission: No    Physical Exam: Vital Signs Blood pressure 128/70, pulse 97, temperature 97.6 F (36.4 C), resp. rate 17, height 6\' 2"  (1.88 m), weight 89.6 kg, SpO2 93%.   PE: Constitution: Appropriate appearance for age. No apparent distress.  Seen in therapy gym. HEENT: EOMI.  PERRLA.  Glasses donned. Resp: No respiratory distress. No accessory muscle usage. on RA and CTAB Cardio: Well perfused appearance.  2+ pitting edema at the right thigh.  Legs wrapped in Ace bandages.   Abdomen: Nondistended. Nontender.  Nondistended.  Positive bowel sounds, normoactive. Psych: Appropriate mood and affect.  Mildly anxious. Skin:  - Mepilex dressing to right heel, sacral heart to low back for DTI's, unstageable-not examined  Neurologic Exam:   Awake, alert, and oriented x 3.  No apparent  cognitive deficits. Sensory exam: revealed normal sensation in all dermatomal regions in bilateral upper and lower extremities Motor exam: Antigravity and against resistance in all 4 extremities coordination: Fine motor coordination was normal.             Assessment/Plan: 1. Functional deficits which require 3+ hours per day of interdisciplinary therapy in a comprehensive inpatient rehab setting. Physiatrist is providing close team supervision and 24 hour management of active medical problems listed below. Physiatrist and rehab team continue to assess barriers to discharge/monitor patient progress toward  functional and medical goals  Care Tool:  Bathing    Body parts bathed by patient: Right arm, Left arm, Chest, Face, Abdomen   Body parts bathed by helper: Front perineal area, Right lower leg, Left upper leg, Right upper leg, Buttocks, Left lower leg     Bathing assist Assist Level: Total Assistance - Patient < 25%     Upper Body Dressing/Undressing Upper body dressing   What is the patient wearing?: Pull over shirt    Upper body assist Assist Level: Contact Guard/Touching assist    Lower Body Dressing/Undressing Lower body dressing      What is the patient wearing?: Pants     Lower body assist Assist for lower body dressing: Total Assistance - Patient < 25%     Toileting Toileting    Toileting assist Assist for toileting: Maximal Assistance - Patient 25 - 49%     Transfers Chair/bed transfer  Transfers assist  Chair/bed transfer activity did not occur: Safety/medical concerns (unsafe to get up)  Chair/bed transfer assist level: Moderate Assistance - Patient 50 - 74%     Locomotion Ambulation   Ambulation assist   Ambulation activity did not occur: Safety/medical concerns (amb 8' in //bars with min assist and +2 for WC follow)          Walk 10 feet activity   Assist  Walk 10 feet activity did not occur: Safety/medical concerns        Walk 50 feet activity   Assist Walk 50 feet with 2 turns activity did not occur: Safety/medical concerns         Walk 150 feet activity   Assist Walk 150 feet activity did not occur: Safety/medical concerns         Walk 10 feet on uneven surface  activity   Assist Walk 10 feet on uneven surfaces activity did not occur: Safety/medical concerns         Wheelchair     Assist Is the patient using a wheelchair?: Yes Type of Wheelchair: Manual    Wheelchair assist level: Contact Guard/Touching assist Max wheelchair distance: 61'    Wheelchair 50 feet with 2 turns  activity    Assist        Assist Level: Contact Guard/Touching assist   Wheelchair 150 feet activity     Assist      Assist Level: Moderate Assistance - Patient 50 - 74%   Blood pressure 128/70, pulse 97, temperature 97.6 F (36.4 C), resp. rate 17, height 6\' 2"  (1.88 m), weight 89.6 kg, SpO2 93%.  Medical Problem List and Plan: 1. Functional deficits secondary to retroperitoneal hematoma after recent fall.             -also right right AC jt injury, ?clavicle fx             -patient may  shower  -Stable to continue CIR             -  ELOS/Goals: 10-12 days, supervision goals with PT, OT -  DC date goal extended to 12/30   - 12/10: Family installing chair lift. Has HH in place. Functionally limited by RLE pain.  - 12/17: Limited by orthostasis per therapies, and decreased insight. CGA LBD. Hasn't made much progress with PT. Walker 22 feet Min A with RW yesterday. RLE pain limiting but improving. Self-limiting.  CGA ambulatory goals. May need WC at discharge.    2.  Antithrombotics: -DVT/anticoagulation:  Pharmaceutical: Eliquis 5mg  BID             -antiplatelet therapy: none             -pt with new right lower extremity edema and thigh pain. Dopplers pending                         -eliquis recently resumed, so findings won't change medical mgt 3. Pain Management: Tylenol, oxycodone as needed for hip  -right thigh pain. ?muscle strain. No sensory findings. Most recent CT reviewed.  It's possible he's had further migration of blood further down into the thigh as there was blood along the distal iliopsoas on that CT                         -add prn robaxin                         -order kpad                          -dopplers pending as above - 12/10: Per OT Max 2 OOB due to leg pain.  On exam,?  Meralgia paresthetica based on distribution, will add gabapentin 100 mg 3 times daily due to renal deficiency.  Increase oxycodone to 5 to 10 mg as needed.  Not scheduled Tylenol due  to elevated LFTs. -12/12: Episode of dizziness a.m., had taken a.m. oxycodone several hours prior.  Decrease as needed to 5 mg every 6 hours as needed.  DC gabapentin. - 12-13: Dizziness not recurrent.  Start Robaxin 750 mg 3 times daily for ongoing thigh pain. - 12-16: LFTs essentially normalized.  Order standing Tylenol 1000 mg 3 times daily 12-17: Pain improving.  Patient wants just to stay on current regimen for 1 to 2 days prior to down titrating.  4. Mood/Behavior/Sleep: LCSW to evaluate and provide emotional support             -continue melatonin 10mg  (ordered as 5mg  PRN though?)             -antipsychotic agents: n/a -04/14/23 pt slept poorly, takes 5-10mg  ambien at home; d/t age, will start 5mg  dose PRN, advised he has to ask for it; melatonin PRN still in place, for if ambien doesn't help.  12-16: Used both PRNs yesterday.  Still having panic attacks and severe anxiety per nursing.  Order standing BuSpar 5 mg 3 times daily.  Would benefit from neuropsych consult 12-17: Monitor BuSpar 1 to 2 days before increasing.   5. Neuropsych/cognition: This patient is capable of making decisions on his own behalf. - 12/10: SLP consult for reported cognitive deficits; may be medication induced per wife  6. Skin/Wound Care: Routine skin care checks   - Stage 2 to sacrum reported-now unstageable 12-11: Unstageable found on right heel.  Added Medihoney to heel and low back, changed to low-air-loss mattress -  WOCN consulted 12/11 for recs   - Lateral trunk rash managed with steroid creams essentially resolved   -12-13: Left antecubital IV infiltration.  Ice and compression to area.  Resolved  7. Fluids/Electrolytes/Nutrition/hyponatremia: Strict Is and Os and follow-up chemistries   - 12/10: Fluid restriction for mild hyponatremia today.  Repeat tomorrow a.m. - 12/11: Na 129 today; was not compliant with fluid restriction yesterday. Continue today and repeat in AM tomorrow; if no improvement will  need to start gentle IVF. Add 40 meq daily potassium.  12-12: NA 131, improved.  Potassium normalized.  DC supplementation of potassium given all priors were normal on same dose of torsemide.  - 04/13/23: NA 130, BUN/creatinine stable, same as yesterday.  Monitor - 12-16: Labs stable  8: Hypertension: monitor TID and prn (Entresto, torsemide held)>> still on torsemide 20mg  daily             -currently on midodrine 10 mg TID for hypotension -12-12: Slightly hypotensive, on fluid restriction with diuresis, would continue midodrine for now.  Questionable orthostatic episode today, symptoms more vertiginous, treatment as below.  12-13: BP improved, no further symptoms orthostasis, monitor  -12/14-15/24 BPs great, monitor  12-16: Decrease midodrine to 5 mg twice daily.  Wean gradually over the next few days if tolerated.  Has TED hose.  12/17: increase midodrine back to 7.5 twice daily due to symptomatic orthostasis with therapies; question more due to deconditioning due to tendency to happen at end of ambulation. Vitals:   04/12/23 1320 04/12/23 1919 04/13/23 0449 04/13/23 1333  BP: 122/68 113/60 117/69 123/62   04/13/23 1913 04/14/23 0243 04/14/23 1314 04/14/23 1912  BP: 120/62 131/73 (!) 114/57 129/66   04/15/23 0515 04/15/23 1403 04/15/23 1917 04/16/23 0615  BP: 121/69 132/75 (!) 109/59 128/70     9:  Cardiorenal syndrome/acute kidney Injury on chronic kidney disease stage IIIb (baseline creatinine around 2)              -milrinone discontinued 12/08             -no indication for HD             -daily weight             -continue sodium bicarbonate 650 mg BID             -continue torsemide 20 mg daily  12/10-14: Renal function appears stable.   -04/14/23 Cr up to 2.25 today, baseline is 2 so will just encourage PO intake for now, but monitor closely 12-16: Creatinine down to 2.0.  Back to baseline.    10: Fall/right retroperitoneal hematoma: now back on Eliquis; H and H stable              -follow-up CBC - stable   11: Atypical atrial flutter; hx of pAF: rate controlled (low 100s)             -on Eliquis 5mg  BID, amiodarone 200mg  daily   12: COPD: former tobacco smoker, quit 2018   13: DM-2: CBGs QID; A1c = 6.9% (home on Ozempic; metformin discontinued due to CKD)             -continue SSI  Blood sugars have remained relatively stable -04/13/23 CBGs variable, many >200, would probably benefit from some optimization, but defer to weekday team to discuss options -04/14/23 CBGs a bit better but still might benefit from further optimization 12-17: BG is well-controlled.  Monitor. 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  I's and O's remain significantly positive.  Added TED hose for bilateral lower extremity edema; weights remain stable, no symptoms of volume overload. 12/12-17: Weights stable.  Can't tolerate TEDs, advised we could ACE wrap his RLE to help with the swelling; pt will consider it - done 12/17 [facility] Weights   04/13/23 0544 04/14/23 0530 04/15/23 0629  Weight: 89.7 kg 89.7 kg 89.6 kg      16: Elevated transaminases, up trending: ? due to congestive hepatopathy             -follow-up CMP-downtrending, but persistent, f/up Monday   17: Multiple falls; AC joint injury/possible right distal clavicular fracture 11/05   18: Possible pneumonia: continue Augmentin to finish the course   - 12/10:  Repeat chest x-ray without consolidation  - 12/11: Augmentin stopped per ID  recommendations  19: Leukocytosis, chronic: negative infectious work-up except possible PNA; had short course steroids for skin rash             -appears to be off prednisone now             -on Augmentin (end 12/11)             -follow-up CBC with diff on 12/10 12/10: Uptrending leukocytosis 28.3 -> 37.8 -> 43.7 despite stopping p.o. steroids on 12-7.  Extensive inpatient workup for leukocytosis described in H&P.  Does have chronic underlying leukocytosis in the 20s.  Will consult infectious disease for additional workup/ideas today, feel less likely reactive given that thrombocytosis is improving and other parameters remained stable.  12/11: Mildly decreased to 35 today. Remains afebrile. ID consulted yesterday, recommending DC augmentin, daily monitorring, and drainage of psoas fluid collection if no improvement. Appreciate their insight and recommendations. 12-12: WBC back up to 40, thrombocytosis mildly uptrending.  Afebrile, per infectious disease aspiration of right hip fluid performed and cultures pending.  Appreciate their recommendations and management. 12-13: 300 cc hip aspirate with multiple WBCs and PMNs.  Patient started on vancomycin per infectious disease.  Monitor for final culture results.--Lactobacillus contaminant 12-16: Culture grew contaminant as above.  WBC is downtrending.  Stop vancomycin, repeat CBC every 72 hours and reconsult ID as needed.  Very much appreciate their assistance in the management of this patient. Repeat CBC 12-19  20: Lung nodule: follow-up outpatient with PCP   21: Hx of bladder cancer s/p TURP 2013   22: Skin rash treated with prednisone/hydroxyzine/calamine lotion             -appears prednisone has been discontinued-rash essentially resolved.   23: Chronic anemia: H and H stable times 72 hours             -follow-up CBC-stable 12/14   24: Thrombocytosis; fluctuating: follow-up CBC.  Ongoing..   25. Vertigo/dizziness.  Started 12-12, no  nystagmus on exam. -orthostatic vitals every morning -Added as needed meclizine 12.5 mg twice daily -Possible symptoms are secondary to medication burden, would wean down pain medications as tolerated -12-13: Improved, no recurrent symptoms endorsed 12-17: Increasing midodrine as above.  Seems more due to deconditioning, monitor.  LOS: 8 days A FACE TO FACE EVALUATION WAS PERFORMED  Angelina Sheriff 04/16/2023, 10:17 AM

## 2023-04-16 NOTE — Progress Notes (Signed)
Physical Therapy Session Note  Patient Details  Name: [name] [name] MRN: [id number] Date of Birth: Mar 14, 1946  Today's Date: 04/16/2023 PT Individual Time: 1045-1130 PT Individual Time Calculation (min): 45 min   Short Term Goals: Week 1:  PT Short Term Goal 1 (Week 1): Pt will complete sit<>stand with min assist PT Short Term Goal 2 (Week 1): Pt will complete bed mobility with min assist with LRAD PT Short Term Goal 3 (Week 1): Pt will ambulate 21' with min assist and LRAD PT Short Term Goal 4 (Week 1): Pt will complete up/down 1 step with BHRs mod assist  Skilled Therapeutic Interventions/Progress Updates:    Session focused on functional bed mobility to come to EOB, sit <> stands, and gait training. PT donned ACE wraps for orthostasis prior to OOB. Required min assist to come to EOB for trunk facilitation. No c/o dizziness. Donned shoes with max assist EOB with pt using BUE support for balance initially. Elevated bed surface with mod assist for sit > stand with RW with cues for hand placement and min assist transfer to w/c for stand step transfer with RW. Focused on sit > stand from lower surface w/c with cues for hand placement and technique with mod assist and then the next several attempts during the session progressed to min assist. Gait training to tolerance x 22' with min assist with RW with pt limited by fatigue in UE's overall per report. Performed 2 more sit <> stands with focus on technique and eccentric control with overall min assist. Educated pt on importance of sitting up OOB, and gave pt option for recliner or w/c upon return to room. Pt states he wanted to be in bed so wife could feed him - strongly suggested that he sit up in chair for improved ability to eat, upright tolerance/BP management and more normal return to home situation. Pt agreeable to recliner. Performed min assist transfer with RW and moved ROHO cushion to the recliner for improved tolerance and elevated surface.  All needs inr each and wife at bedside.   Therapy Documentation Precautions:  Precautions Precautions: Fall Precaution Comments: watch BP and HR Restrictions Weight Bearing Restrictions Per Provider Order: No RUE Weight Bearing Per Provider Order: Weight bearing as tolerated    Pain:  Reports pain in RLE - declines pain intervention needed at this time.     Therapy/Group: Individual Therapy  Karolee Stamps Darrol Poke, PT, DPT, CBIS  04/16/2023, 12:05 PM

## 2023-04-16 NOTE — Progress Notes (Signed)
Patient ID: [name] [name], male   DOB: Feb 04, 1946, 77 y.o.   MRN: [id number]  SW met with pt and pt wife in room to provide updates from team conference, and informed on change in discharge date from 12/23 to 12/30 to allow for him to build of his strength and endurance. He and wife are in agreement. Pt wife reports that the stair lift will not be built until middle of January. SW will provide updates after team conference.   Cecile Sheerer, MSW, LCSW Office: [phone] Cell: [phone] Fax: [phone]

## 2023-04-16 NOTE — Progress Notes (Signed)
Occupational Therapy Session Note  Patient Details  Name: [name] [name] MRN: [id number] Date of Birth: Jun 06, 1945  Today's Date: 04/16/2023 OT Individual Time: 0930-1030 OT Individual Time Calculation (min): 60 min    Short Term Goals: Week 2:  OT Short Term Goal 1 (Week 2): LB dressing with AE and min A OT Short Term Goal 2 (Week 2): Standing 2 min sink side with LRAD for grooming with min A OT Short Term Goal 3 (Week 2): Toilet/stall shower transfer with LRAD and min A  Skilled Therapeutic Interventions/Progress Updates:  Pt seen for OT an extra 15 min to have time for shower for the 1st time. Pt completing session with PT with hand off and reports of feeling "woozy". BP taken with 112/70, HR 98 and SpO2 99%. Cues for breathing and hydration with ACE wraps in place. OT wrapped ACE wraps for waterproofing to ensure no episode of orthostasis occurred as well as R heel protection. Pt transferred to and from w/c to TTB with bars with mod A SPT. Min A UB bathing and max A LB bathing. Transferred back to w/c for dressing with set up and CGA for pull over shirt and max A for shorts management. Transfer via SPT back to bed for rest and OT replaced sacral pad. Pt requested ACE wrapping removed. Heel float on pillow on R LE. Provided all safety measures and needs with call button and bed rails in place.    Pain: 2/10 R LE this session with warm shower and rest with R LE heel float and elevation at end of session   Therapy Documentation Precautions:  Precautions Precautions: Fall Precaution Comments: watch BP and HR Restrictions Weight Bearing Restrictions Per Provider Order: No RUE Weight Bearing Per Provider Order: Weight bearing as tolerated   Therapy/Group: Individual Therapy  Vicenta Dunning 04/16/2023, 11:16 AM

## 2023-04-16 NOTE — Progress Notes (Signed)
Physical Therapy Weekly Progress Note  Patient Details  Name: [name] [name] MRN: [id number] Date of Birth: 1945-05-13  Beginning of progress report period: April 09, 2023 End of progress report period: April 16, 2023  Today's Date: 04/16/2023 PT Individual Time: 1335-1418 PT Individual Time Calculation (min): 43 min   Patient has met 0 of 4 short term goals. Pt making slow progress towards functional goals but has been limited due to RLE pain and orthostatic hypotension, poor tolerance to OOB and endurance. Pt completes bed mobility with min/mod assist, transfers with min/mod assist, and ambulates 22' with min assist, can ambulate up to 41' with min assist and 2nd person WC follow. Education with pt wife has been ongoing however pt would benefit from formal family training prior to DC.  Patient continues to demonstrate the following deficits muscle weakness and muscle joint tightness, decreased cardiorespiratoy endurance, and decreased sitting balance, decreased standing balance, decreased postural control, and decreased balance strategies and therefore will continue to benefit from skilled PT intervention to increase functional independence with mobility.  Patient progressing toward long term goals..  Continue plan of care.  PT Short Term Goals Week 1:  PT Short Term Goal 1 (Week 1): Pt will complete sit<>stand with min assist PT Short Term Goal 1 - Progress (Week 1): Progressing toward goal PT Short Term Goal 2 (Week 1): Pt will complete bed mobility with min assist with LRAD PT Short Term Goal 2 - Progress (Week 1): Progressing toward goal PT Short Term Goal 3 (Week 1): Pt will ambulate 36' with min assist and LRAD PT Short Term Goal 3 - Progress (Week 1): Progressing toward goal PT Short Term Goal 4 (Week 1): Pt will complete up/down 1 step with BHRs mod assist PT Short Term Goal 4 - Progress (Week 1): Progressing toward goal Week 2:  PT Short Term Goal 1 (Week 2): Pt will  consistently complete sit<>stands with CGA PT Short Term Goal 2 (Week 2): Pt will ambulate 49' with min assist with LRAD PT Short Term Goal 3 (Week 2): Pt will complete bed mobility with CGA consistently PT Short Term Goal 4 (Week 2): Pt will complete up/down 1 step with min assist  Skilled Therapeutic Interventions/Progress Updates:  Pt presents in room in bed, agreeable to PT, reporting mild pain in RLE unrated. Pt requesting to focus on BUE/core strength/endurance this session due to insufficient upper body strength noted with ambulation and transfers, session focused on therex to address above as well as therapeutic activities for transfer training and DC planning and WC mobility training.  Pt wife present and therapist discusses new DC date as well as anticipated goals for DC. Therapist educates pt on possibility of getting WC for home due to decreased endurance with ambulation at this time with pt reluctant but agreeable with education. Pt wife discussing hospital bed for DC. Pt vitals assessed in supine prior to mobility BP 125/68, HR 105 bpm.  Pt completes bed mobility with min assist for RLE mgmt and trunk to upright. Pt completes sit to stand to RW from eelvated EOB min assist, completes stand step trnasfer with RW min assist.  Pt self propels WC with BUEs with CGA room to day room, 150' with one rest break due to fatigue, completed to promote improved independence with mobility as well as improved UE endurance needed for functional ambulation and transfers.  Pt completes seated therex for BUE/core strength to promote improved muscle fiber recruitment needed for functional ambulation and transfers including: -  seated rows x10 red tband - band pull aparts x10 red tband - seated straight arm lat pull downs x10 red tband - WC press ups x10 - modified sit ups from reclined position x10 2# med ball, x10 4# med ball - chest pass x20 to therapist 4# med ball  Pt returns to room and remains  seated in [facility] with all needs within reach, cal light in place and chair alarm donned and activated at end of session.   Ambulation/gait training;Disease management/prevention;Pain management;Stair training;Balance/vestibular training;DME/adaptive equipment instruction;Patient/family education;Therapeutic Activities;Psychosocial support;Therapeutic Exercise;Community reintegration;Functional mobility training;UE/LE Strength taining/ROM;Discharge planning;Neuromuscular re-education;UE/LE Coordination activities   Therapy Documentation Precautions:  Precautions Precautions: Fall Precaution Comments: watch BP and HR Restrictions Weight Bearing Restrictions Per Provider Order: No RUE Weight Bearing Per Provider Order: Weight bearing as tolerated   Therapy/Group: Individual Therapy  Edwin Cap PT, DPT 04/16/2023, 3:51 PM

## 2023-04-16 NOTE — Progress Notes (Signed)
Occupational Therapy Session Note  Patient Details  Name: [name] [name] MRN: [id number] Date of Birth: [birth year]  Today's Date: 04/16/2023 OT Individual Time: 1445-1510 OT Individual Time Calculation (min): 25 min  and Today's Date: 04/16/2023 OT Missed Time: 20 Minutes Missed Time Reason:  fatigue    Short Term Goals: Week 2:  OT Short Term Goal 1 (Week 2): LB dressing with AE and min A OT Short Term Goal 2 (Week 2): Standing 2 min sink side with LRAD for grooming with min A OT Short Term Goal 3 (Week 2): Toilet/stall shower transfer with LRAD and min A  Skilled Therapeutic Interventions/Progress Updates:   Pt received sitting up with moderate pain in his R hip, agreeable to OT session. Rest breaks used for pain relief. He required min A to change his shirt. Pt was taken via w/c to the therapy gym for time management. He completed sit > stand with mod A from the w/c and min A for stand pivot with the RW to the mat. He reported wooziness and his BP was assessed- 85/52. He requested to return to the w/c and to his room, stating he is too worn out to continue. Min A to return to the w/c and he was then taken back to his room. He completed stand pivot to the EOB and required mod A to return to bed, for management of LE. He was left supine with all needs met.     Therapy Documentation Precautions:  Precautions Precautions: Fall Precaution Comments: watch BP and HR Restrictions Weight Bearing Restrictions Per Provider Order: No RUE Weight Bearing Per Provider Order: Weight bearing as tolerated   Therapy/Group: Individual Therapy  [name] 04/16/2023, 6:16 AM

## 2023-04-16 NOTE — Patient Care Conference (Signed)
Inpatient RehabilitationTeam Conference and Plan of Care Update Date: 04/16/2023   Time: 10:43 AM    Patient Name: [name] [name]      Medical Record Number: [id number]  Date of Birth: 1945-10-01 Sex: Male         Room/Bed: 4W20C/4W20C-01 Payor Info: Payor: MEDICARE / Plan: MEDICARE PART A AND B / Product Type: *No Product type* /    Admit Date/Time:  04/08/2023  5:09 PM  Primary Diagnosis:  Debility  Hospital Problems: Principal Problem:   Debility Active Problems:   Leukocytosis    Expected Discharge Date: Expected Discharge Date: 04/13/2023 (d/c extended)  Team Members Present: Physician leading conference: Dr. Elijah Birk Social Worker Present: Cecile Sheerer, LCSWA Nurse Present: Chana Bode, RN PT Present: Darrold Span, PT OT Present: Valetta Fuller, OT SLP Present: Jeannie Done, SLP PPS Coordinator present : Fae Pippin, SLP     Current Status/Progress Goal Weekly Team Focus  Bowel/Bladder   patient is contient of B/B LBM 12/16   maintain continence   offer toileting qshift and PRN    Swallow/Nutrition/ Hydration               ADL's   min A UB bath/dress, max A LB bath/dress, mod fading to min A SPT to commode and w/c, sink side seated grooming set up, mainly limited by pain and poor mobility and OOB tolerance   S UB, CGA LB and ADL/bathroom mobility   continue pain management, activity tolerance, especially with standing, DME needs for d/c planning    Mobility   modA overall, bed mobility, transfers with RW; gait 22' with RW min assist   CGA/min assist ambulatory  barrier: RLE pain, hypotension and dizziness, poor tolerance to OOB; focus on OOB tolerance, transfer training, gait training, RLE strengthening, ace wrapping BLEs for [facility]    Communication                Safety/Cognition/ Behavioral Observations               Pain   patient c/o of right leg pain usually 7-8/10   <4/10 on pain scale   assess pain qshift and PRN using ice and  PRN medication    Skin   patient has DTI on right heel and bruising   keep wounds infection free and healing  assess wounds q shift and PRN, wound care and dressing changes per order. informed doctor if wound does not appear to be healing or shows s/s of infection      Discharge Planning:  D/c to home with his wife who will be primary caregiver. Wife reports they will have support from her family, and their dtr and SIL live close by and are retired and can help PRN. SW will confirm there are no barriers to discharge.   Team Discussion: Patient post retroperitoneal hematoma with debility and leukocytosis. Hematoma tapped; treated with abx. WBC trending down. Limited by poor activity tolerance, intermittent orthostasis and decreased insight into deficits with self limiting behaviors.  Patient on target to meet rehab goals: yes, currently needs min assist for upper body care and max assist for lower body care. Completes stand pivot transfers with min assist. Ambulate up to 22' using a RW with min assist; limited by dizziness and right leg pain. Goals for discharge set for supervision to CGA overall.  *See Care Plan and progress notes for long and short-term goals.   Revisions to Treatment Plan:  N/a   Teaching Needs: Safety, skin  care, medications, diet modification, transfers, toileting, etc.   Current Barriers to Discharge: Decreased caregiver support  Possible Resolutions to Barriers: Family education HH follow up services DME: TTB, Wheelchair     Medical Summary Current Status: medically complicated by leukocytosis, hypotension on midodrine, left hip pain, hyponatremia and vertigo  Barriers to Discharge: Behavior/Mood;Cardiac Complications;Electrolyte abnormality;Self-care education;Uncontrolled Pain;Medical stability;Renal Insufficiency/Failure   Possible Resolutions to [employer] Focus: Monitor labs for hyponatremia, CKD, and leukocytosis s/p R psoas drainage off of  antibiotics,  TED/Binder when OOB while weaning midodrine, fluid restriction for hyponatremia, titrate pain medication to lowest tolerable dose due to cognitive side effects,   Continued Need for Acute Rehabilitation Level of Care: The patient requires daily medical management by a physician with specialized training in physical medicine and rehabilitation for the following reasons: Direction of a multidisciplinary physical rehabilitation program to maximize functional independence : Yes Medical management of patient stability for increased activity during participation in an intensive rehabilitation regime.: Yes Analysis of laboratory values and/or radiology reports with any subsequent need for medication adjustment and/or medical intervention. : Yes   I attest that I was present, lead the team conference, and concur with the assessment and plan of the team.   Chana Bode B 04/16/2023, 8:30 PM

## 2023-04-16 NOTE — Progress Notes (Signed)
Physical Therapy Session Note  Patient Details  Name: [name] [name] MRN: [id number] Date of Birth: 1946-01-08  Today's Date: 04/16/2023 PT Individual Time: 0900-0930 PT Individual Time Calculation (min): 30 min   Short Term Goals: Week 2:  PT Short Term Goal 1 (Week 2): Pt will consistently complete sit<>stands with CGA PT Short Term Goal 2 (Week 2): Pt will ambulate 62' with min assist with LRAD PT Short Term Goal 3 (Week 2): Pt will complete bed mobility with CGA consistently PT Short Term Goal 4 (Week 2): Pt will complete up/down 1 step with min assist  Skilled Therapeutic Interventions/Progress Updates:  Ambulation/gait training;Disease management/prevention;Pain management;Stair training;Balance/vestibular training;DME/adaptive equipment instruction;Patient/family education;Therapeutic Activities;Psychosocial support;Therapeutic Exercise;Community reintegration;Functional mobility training;UE/LE Strength taining/ROM;Discharge planning;Neuromuscular re-education;UE/LE Coordination activities   Pt received supine in bed and agrees to therapy. Reports some pain in RLE. PT provides ace wrapping, rest breaks, and repositioning to manage pain. Prior to mobility PT ace wraps both legs form feet to just proximal to knees for edema management as well as BP control. Pt performs supine to sit slowly with minA to facilitate sidelying to sit, with cues for hand placement and body mechanics. Pt sits at EOB while PT assists to don shoes, with pt having one LOB backward. 7PT provides cues for optimal body mechanics to promote improved balance. Pt performs sit to stand from elevated bed with minA and RW, with cues for intiation, anterior weight shifting, and hand placement. Stand step to [facility] with minA and cues for increased eccentric control of stand to sit for safety and strengthening. WC transport to gym. Pt ambulates x70' with minA, progressing to modA due to increasing forward flexion and difficulty  forward progression of Rt leg, requiring +2 to bring [facility] for safety. Pt verbalizes fatigue in legs as well as light headedness. Pt handed off to OT.    Therapy Documentation Precautions:  Precautions Precautions: Fall Precaution Comments: watch BP and HR Restrictions Weight Bearing Restrictions Per Provider Order: No RUE Weight Bearing Per Provider Order: Weight bearing as tolerated    Therapy/Group: Individual Therapy  Beau Fanny, PT, DPT 04/16/2023, 4:24 PM

## 2023-04-17 DIAGNOSIS — R5381 Other malaise: Secondary | ICD-10-CM | POA: Diagnosis not present

## 2023-04-17 LAB — GLUCOSE, CAPILLARY
Glucose-Capillary: 175 mg/dL — ABNORMAL HIGH (ref 70–99)
Glucose-Capillary: 182 mg/dL — ABNORMAL HIGH (ref 70–99)
Glucose-Capillary: 128 mg/dL — ABNORMAL HIGH (ref 70–99)
Glucose-Capillary: 164 mg/dL — ABNORMAL HIGH (ref 70–99)

## 2023-04-17 MED ORDER — ENSURE ENLIVE PO LIQD: 1.0000 | Freq: Three times a day (TID) | ORAL | Status: DC

## 2023-04-17 NOTE — Progress Notes (Signed)
Physical Therapy Session Note  Patient Details  Name: [name] [name] MRN: [id number] Date of Birth: Sep 20, 1945  Today's Date: 04/17/2023 PT Individual Time: 0910-1020 PT Individual Time Calculation (min): 70 min   Short Term Goals: Week 2:  PT Short Term Goal 1 (Week 2): Pt will consistently complete sit<>stands with CGA PT Short Term Goal 2 (Week 2): Pt will ambulate 55' with min assist with LRAD PT Short Term Goal 3 (Week 2): Pt will complete bed mobility with CGA consistently PT Short Term Goal 4 (Week 2): Pt will complete up/down 1 step with min assist  Skilled Therapeutic Interventions/Progress Updates: Patient supine in bed following personal care with RN on entrance to room. Patient alert and agreeable to PT session.   Patient reported no pain, only fatigue this morning due to being awake before dawn. RN stated that pt did not want to wear brief. PTA encouraged pt to wear one due to pt reporting not always being able to control BM when pt can feel he needs one (RN reported pt has been having loose stools). Pt agreed to wear brief to avoid contamination of gyms.  Pt performed R and L sidelying from supine in bed with minA to donn brief (pt bridged and doffed personal shorts with modA to get shorts off LE's - pt able to donn around waist via bridge). PTA donned B LE Ace wraps while pt in supine from plantar surface of foot to distal knee. Pt performed supine<>sit on EOB with minA and increased time to perform. VC required for sequence. Pt sitting EOB with PTA donning personal shoes with totalA (pt with heavy posterior lean). Pt required use of B UE's to maintain truncal support. Pt required minA for trunk support after B UE's began to fatigue. PTA continued donning personal shoes.  Pt performed sit to stand to RW with bed elevated and modA to stand. Pt standing EOB in RW with maxA, and VC to extend B knees and hip due to forward flexed posture - pt also with heavy posterior lean that  decreased after pt followed extension cue. Pt pivoted to WC with modA and VC to advance B LE's accordingly to pivot. Pt also cued to reach back to arm chair to control descent. Attending MD present at this time to watch pt transfer, and to do morning rounds. Pt BP monitored sitting in WC prior to leaving room. Pt transported to main gym in [facility] dependently for time management. PTA doffed ACE wrap on B LE's for new ones as current ones started to lose elasticity, and to adjust in better position with pt in WC vs on air mattress. Pt performed B UE flexion with 5lb bar with cues to slowly descend weight onto lap from slightly above 90* in order to focus on eccentric control of shoulder flexors. Pt performed therex until close to fatigue with VC to monitor breathing. Pt then attempted to stand to RW after having BP checked in [facility] in order to check orthostatics from seating position and standing. Pt with multiple attempts to stand with no success with VC to increase anterior weight shift. Pt reports fatigue this morning vs previous therapy sessions earlier in week. Pt transported back to room in [facility]. Pt requested to go back to bed. Pt encouraged to sit in [facility] between sessions to increase tolerance to OOB activities, and further emphasized to stay OOB as much as able due to [facility] presentation. Pt agreed.   Patient sitting in wC at end of session  with brakes locked, chair alarm set, and all needs within reach.      Therapy Documentation Precautions:  Precautions Precautions: Fall Precaution Comments: watch BP and HR Restrictions Weight Bearing Restrictions Per Provider Order: No RUE Weight Bearing Per Provider Order: Weight bearing as tolerated  Vitals: 131/85 (99) sitting in WC  126/62 (82) in WC in main gym  Therapy/Group: Individual Therapy  Madeliene Tejera PTA 04/17/2023, 12:56 PM

## 2023-04-17 NOTE — Progress Notes (Signed)
PROGRESS NOTE   Subjective/Complaints:  No events overnight.  Seen transferring from bed to wheelchair this a.m., fatigues quickly with heavy posterior lean, color somewhat drained, needs significant amount of assistance to complete.  Afterwards, discussed symptoms with patient's, he just states "I was so focused on the transfer, I did not notice" if his hip was hurting or if he was feeling orthostatic.  Denies any other symptoms at this time  Only as needed utilization oxycodone.   Vital stable, has atrial fibrillation, heart rate from 70s to 120s intermittently.  ROS: Right lower extremity lateral thigh burning pain, aching, spasming-resolved +vertigo/dizziness/orthostasis-improving, +insomnia-improved with medication  denies fevers, chills, N/V, abdominal pain, constipation, diarrhea, SOB, cough, chest pain, new weakness or paraesthesias.    Objective:   No results found.  Recent Labs    04/15/23 0535  WBC 19.5*  HGB 10.2*  HCT 31.0*  PLT 640*   Recent Labs    04/15/23 0535  NA 132*  K 4.1  CL 95*  CO2 26  GLUCOSE 142*  BUN 33*  CREATININE 2.00*  CALCIUM 7.9*    Intake/Output Summary (Last 24 hours) at 04/17/2023 2352 Last data filed at 04/17/2023 1610 Gross per 24 hour  Intake 600 ml  Output 2 ml  Net 598 ml      Pressure Injury 04/08/23 Ankle Posterior;Right Stage 2 -  Partial thickness loss of dermis presenting as a shallow open injury with a red, pink wound bed without slough. unattached edges scant drainage. This LDA replaces one from 03/03/23 that was entered (Active)  04/08/23 1730  Location: Ankle  Location Orientation: Posterior;Right  Staging: Stage 2 -  Partial thickness loss of dermis presenting as a shallow open injury with a red, pink wound bed without slough.  Wound Description (Comments): unattached edges scant drainage. This LDA replaces one from 03/03/23 that was entered inaccurately as a  blister that had opened.  Present on Admission: Yes     Pressure Injury 04/14/23 Heel Left;Lateral Deep Tissue Pressure Injury - Purple or maroon localized area of discolored intact skin or blood-filled blister due to damage of underlying soft tissue from pressure and/or shear. (Active)  04/14/23 1010  Location: Heel  Location Orientation: Left;Lateral  Staging: Deep Tissue Pressure Injury - Purple or maroon localized area of discolored intact skin or blood-filled blister due to damage of underlying soft tissue from pressure and/or shear.  Wound Description (Comments):   Present on Admission: No    Physical Exam: Vital Signs Blood pressure 121/62, pulse 100, temperature 97.7 F (36.5 C), resp. rate 16, height 6\' 2"  (1.88 m), weight 75 kg, SpO2 95%.   PE: Constitution: Appropriate appearance for age. No apparent distress.  Seen in therapy gym. HEENT: EOMI.  PERRLA.  Glasses donned. Resp: No respiratory distress. No accessory muscle usage. on RA and CTAB Cardio: Well perfused appearance.  2+ pitting edema at the right thigh-unchanged.  Legs wrapped in Ace bandages.   Abdomen: Nondistended. Nontender.  Nondistended.  Positive bowel sounds, normoactive. Psych: Appropriate mood and affect.  Mildly anxious. Skin:  - Mepilex dressing to right heel, sacral heart to low back for DTI's, unstageable-not examined  Neurologic Exam:  Awake, alert, and oriented x 3.  No apparent cognitive deficits. Sensory exam: revealed normal sensation in all dermatomal regions in bilateral upper and lower extremities Motor exam: Antigravity and against resistance in all 4 extremities coordination: Fine motor coordination was normal.             Assessment/Plan: 1. Functional deficits which require 3+ hours per day of interdisciplinary therapy in a comprehensive inpatient rehab setting. Physiatrist is providing close team supervision and 24 hour management of active medical problems listed  below. Physiatrist and rehab team continue to assess barriers to discharge/monitor patient progress toward functional and medical goals  Care Tool:  Bathing    Body parts bathed by patient: Right arm, Left arm, Chest, Face, Abdomen   Body parts bathed by helper: Front perineal area, Right lower leg, Left upper leg, Right upper leg, Buttocks, Left lower leg     Bathing assist Assist Level: Total Assistance - Patient < 25%     Upper Body Dressing/Undressing Upper body dressing   What is the patient wearing?: Pull over shirt    Upper body assist Assist Level: Contact Guard/Touching assist    Lower Body Dressing/Undressing Lower body dressing      What is the patient wearing?: Pants     Lower body assist Assist for lower body dressing: Total Assistance - Patient < 25%     Toileting Toileting    Toileting assist Assist for toileting: Maximal Assistance - Patient 25 - 49%     Transfers Chair/bed transfer  Transfers assist  Chair/bed transfer activity did not occur: Safety/medical concerns (unsafe to get up)  Chair/bed transfer assist level: Minimal Assistance - Patient > 75%     Locomotion Ambulation   Ambulation assist   Ambulation activity did not occur: Safety/medical concerns (amb 8' in //bars with min assist and +2 for WC follow)  Assist level: Minimal Assistance - Patient > 75% Assistive device: Walker-rolling Max distance: 22'   Walk 10 feet activity   Assist  Walk 10 feet activity did not occur: Safety/medical concerns  Assist level: Minimal Assistance - Patient > 75% Assistive device: Walker-rolling   Walk 50 feet activity   Assist Walk 50 feet with 2 turns activity did not occur: Safety/medical concerns         Walk 150 feet activity   Assist Walk 150 feet activity did not occur: Safety/medical concerns         Walk 10 feet on uneven surface  activity   Assist Walk 10 feet on uneven surfaces activity did not occur:  Safety/medical concerns         Wheelchair     Assist Is the patient using a wheelchair?: Yes Type of Wheelchair: Manual    Wheelchair assist level: Contact Guard/Touching assist Max wheelchair distance: 51'    Wheelchair 50 feet with 2 turns activity    Assist        Assist Level: Contact Guard/Touching assist   Wheelchair 150 feet activity     Assist      Assist Level: Moderate Assistance - Patient 50 - 74%   Blood pressure 121/62, pulse 100, temperature 97.7 F (36.5 C), resp. rate 16, height 6\' 2"  (1.88 m), weight 75 kg, SpO2 95%.  Medical Problem List and Plan: 1. Functional deficits secondary to retroperitoneal hematoma after recent fall.             -also right right AC jt injury, ?clavicle fx             -  patient may  shower  -Stable to continue CIR             -ELOS/Goals: 10-12 days, supervision goals with PT, OT -  DC date goal extended to 12/30   - 12/10: Family installing chair lift. Has HH in place. Functionally limited by RLE pain.  - 12/17: Limited by orthostasis per therapies, and decreased insight. CGA LBD. Hasn't made much progress with PT. Walker 22 feet Min A with RW yesterday. RLE pain limiting but improving. Self-limiting.  CGA ambulatory goals. May need WC at discharge.    2.  Antithrombotics: -DVT/anticoagulation:  Pharmaceutical: Eliquis 5mg  BID             -antiplatelet therapy: none             -pt with new right lower extremity edema and thigh pain. Dopplers pending                         -eliquis recently resumed, so findings won't change medical mgt 3. Pain Management: Tylenol, oxycodone as needed for hip  -right thigh pain. ?muscle strain. No sensory findings. Most recent CT reviewed.  It's possible he's had further migration of blood further down into the thigh as there was blood along the distal iliopsoas on that CT                         -add prn robaxin                         -order kpad                           -dopplers pending as above - 12/10: Per OT Max 2 OOB due to leg pain.  On exam,?  Meralgia paresthetica based on distribution, will add gabapentin 100 mg 3 times daily due to renal deficiency.  Increase oxycodone to 5 to 10 mg as needed.  Not scheduled Tylenol due to elevated LFTs. -12/12: Episode of dizziness a.m., had taken a.m. oxycodone several hours prior.  Decrease as needed to 5 mg every 6 hours as needed.  DC gabapentin. - 12-13: Dizziness not recurrent.  Start Robaxin 750 mg 3 times daily for ongoing thigh pain. - 12-16: LFTs essentially normalized.  Order standing Tylenol 1000 mg 3 times daily 12-17: Pain improving.  Patient wants just to stay on current regimen for 1 to 2 days prior to down titrating.  4. Mood/Behavior/Sleep: LCSW to evaluate and provide emotional support             -continue melatonin 10mg  (ordered as 5mg  PRN though?)             -antipsychotic agents: n/a -04/14/23 pt slept poorly, takes 5-10mg  ambien at home; d/t age, will start 5mg  dose PRN, advised he has to ask for it; melatonin PRN still in place, for if ambien doesn't help.  12-16: Used both PRNs yesterday.  Still having panic attacks and severe anxiety per nursing.  Order standing BuSpar 5 mg 3 times daily.  Would benefit from neuropsych consult 12-17: Monitor BuSpar 1 to 2 days before increasing. 12-18, remains anxious, but without panic attacks.  Does not appear to be self-limiting during his transfers, simply deconditioned.  Monitor on current medications.   5. Neuropsych/cognition: This patient is capable of making decisions on his own behalf. - 12/10: SLP consult  for reported cognitive deficits; may be medication induced per wife 12-18: Per teams, cognitive baseline.  6. Skin/Wound Care: Routine skin care checks   - Stage 2 to sacrum reported-now unstageable 12-11: Unstageable found on right heel.  Added Medihoney to heel and low back, changed to low-air-loss mattress - WOCN consulted 12/11 for recs    - Lateral trunk rash managed with steroid creams essentially resolved   -12-13: Left antecubital IV infiltration.  Ice and compression to area.  Resolved  7. Fluids/Electrolytes/Nutrition/hyponatremia: Strict Is and Os and follow-up chemistries   - 12/10: Fluid restriction for mild hyponatremia today.  Repeat tomorrow a.m. - 12/11: Na 129 today; was not compliant with fluid restriction yesterday. Continue today and repeat in AM tomorrow; if no improvement will need to start gentle IVF. Add 40 meq daily potassium.  12-12: NA 131, improved.  Potassium normalized.  DC supplementation of potassium given all priors were normal on same dose of torsemide.  - 04/13/23: NA 130, BUN/creatinine stable, same as yesterday.  Monitor - 12-16: Labs stable  8: Hypertension: monitor TID and prn (Entresto, torsemide held)>> still on torsemide 20mg  daily             -currently on midodrine 10 mg TID for hypotension -12-12: Slightly hypotensive, on fluid restriction with diuresis, would continue midodrine for now.  Questionable orthostatic episode today, symptoms more vertiginous, treatment as below.  12-13: BP improved, no further symptoms orthostasis, monitor  -12/14-15/24 BPs great, monitor  12-16: Decrease midodrine to 5 mg twice daily.  Wean gradually over the next few days if tolerated.  Has TED hose.  12/17: increase midodrine back to 7.5 twice daily due to symptomatic orthostasis with therapies; question more due to deconditioning due to tendency to happen at end of ambulation.  12/18: Normotensive. Vitals:   04/14/23 1314 04/14/23 1912 04/15/23 0515 04/15/23 1403  BP: (!) 114/57 129/66 121/69 132/75   04/15/23 1917 04/16/23 0615 04/16/23 1329 04/16/23 2013  BP: (!) 109/59 128/70 120/89 115/77   04/17/23 0608 04/17/23 0620 04/17/23 1452 04/17/23 1919  BP: 124/63 112/65 128/81 121/62     9:  Cardiorenal syndrome/acute kidney Injury on chronic kidney disease stage IIIb (baseline creatinine around 2)               -milrinone discontinued 12/08             -no indication for HD             -daily weight             -continue sodium bicarbonate 650 mg BID             -continue torsemide 20 mg daily  12/10-14: Renal function appears stable.   -04/14/23 Cr up to 2.25 today, baseline is 2 so will just encourage PO intake for now, but monitor closely 12-16: Creatinine down to 2.0.  Back to baseline.    10: Fall/right retroperitoneal hematoma: now back on Eliquis; H and H stable             -follow-up CBC - stable   11: Atypical atrial flutter; hx of pAF: rate controlled (low 100s)             -on Eliquis 5mg  BID, amiodarone 200mg  daily  -Heart rate stable, does have some variation into the low 100s to 120s intermittently.   12: COPD: former tobacco smoker, quit 2018   13: DM-2: CBGs QID; A1c = 6.9% (home on Ozempic; metformin discontinued due  to CKD)             -continue SSI  Blood sugars have remained relatively stable -04/13/23 CBGs variable, many >200, would probably benefit from some optimization, but defer to weekday team to discuss options -04/14/23 CBGs a bit better but still might benefit from further optimization 12-17: BG is well-controlled.  Monitor. 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  I's and O's remain significantly positive.  Added TED hose for bilateral lower extremity edema; weights remain stable, no symptoms of volume overload. 12/12-17: Weights stable.  Can't tolerate TEDs,  advised we could ACE wrap his RLE to help with the swelling; pt will consider it - done 12/17 12-18: Weight significantly down, likely an error, edema around thigh appears approximately the same.  CMP tomorrow a.m. Filed Weights   04/14/23 0530 04/15/23 0629 04/17/23 0608  Weight: 89.7 kg 89.6 kg 75 kg      16: Elevated transaminases, up trending: ? due to congestive hepatopathy             -follow-up CMP-downtrending, but persistent, f/up Monday   17: Multiple falls; AC joint injury/possible right distal clavicular fracture 11/05   18: Possible pneumonia: continue Augmentin to finish the course   - 12/10:  Repeat chest x-ray without consolidation  - 12/11: Augmentin stopped per ID recommendations  19: Leukocytosis, chronic: negative infectious work-up except possible PNA; had short course steroids for skin rash             -appears to be off prednisone now             -on Augmentin (end 12/11)             -follow-up CBC with diff on 12/10 12/10: Uptrending leukocytosis 28.3 -> 37.8 -> 43.7 despite stopping p.o. steroids on 12-7.  Extensive inpatient workup for leukocytosis described in H&P.  Does have chronic underlying leukocytosis in the 20s.  Will consult infectious disease for additional workup/ideas today, feel less likely reactive given that thrombocytosis is improving and other parameters remained stable.  12/11: Mildly decreased to 35 today. Remains afebrile. ID consulted yesterday, recommending DC augmentin, daily monitorring, and drainage of psoas fluid collection if no improvement. Appreciate their insight and recommendations. 12-12: WBC back up to 40, thrombocytosis mildly uptrending.  Afebrile, per infectious disease aspiration of right hip fluid performed and cultures pending.  Appreciate their recommendations and management. 12-13: 300 cc hip aspirate with multiple WBCs and PMNs.  Patient started on vancomycin per infectious disease.  Monitor for final culture  results.--Lactobacillus contaminant 12-16: Culture grew contaminant as above.  WBC is downtrending.  Stop vancomycin, repeat CBC every 72 hours and reconsult ID as needed.  Very much appreciate their assistance in the management  of this patient. Repeat CBC 12-19  20: Lung nodule: follow-up outpatient with PCP   21: Hx of bladder cancer s/p TURP 2013   22: Skin rash treated with prednisone/hydroxyzine/calamine lotion             -appears prednisone has been discontinued-rash essentially resolved.   23: Chronic anemia: H and H stable times 72 hours             -follow-up CBC-stable 12/14   24: Thrombocytosis; fluctuating: follow-up CBC.  Ongoing..   25. Vertigo/dizziness.  Started 12-12, no nystagmus on exam. -orthostatic vitals every morning -Added as needed meclizine 12.5 mg twice daily-discontinued due to no use 12-18 -Possible symptoms are secondary to medication burden, would wean down pain medications as tolerated -12-13: Improved, no recurrent symptoms endorsed 12-17: Increasing midodrine as above.  Seems more due to deconditioning, monitor.  LOS: 9 days A FACE TO FACE EVALUATION WAS PERFORMED  Angelina Sheriff 04/17/2023, 11:52 PM

## 2023-04-17 NOTE — Progress Notes (Signed)
Patient ID: [name] [name], male   DOB: [birth year], 77 y.o.   MRN: [id number]  Per OT, pt will need a 3in1 BSC. SW will order.   Cecile Sheerer, MSW, LCSW Office: [phone] Cell: [phone] Fax: [phone]

## 2023-04-17 NOTE — Progress Notes (Signed)
Physical Therapy Session Note  Patient Details  Name: [name] [name] MRN: [id number] Date of Birth: 1946/01/06  Today's Date: 04/17/2023 PT Individual Time: 1100-1145 PT Individual Time Calculation (min): 45 min   Short Term Goals: Week 2:  PT Short Term Goal 1 (Week 2): Pt will consistently complete sit<>stands with CGA PT Short Term Goal 2 (Week 2): Pt will ambulate 50' with min assist with LRAD PT Short Term Goal 3 (Week 2): Pt will complete bed mobility with CGA consistently PT Short Term Goal 4 (Week 2): Pt will complete up/down 1 step with min assist  Skilled Therapeutic Interventions/Progress Updates: Pt presented in w/c with wife present agreeable to therapy. Pt states significant fatigue as had eventful night due to several BMs overnight. Pt initially requesting to only perform supine LE therex, advised will be able to perform later in session but will not spend entire session in bed. Pt transported to ortho gym and participated UBE L4 forward x 2 min then L2 backwards x 2 min. Pt with significant fatigue after first round therefore for second round dropped to L2 forward x 2 min and L2 backwards x 2 min. Pt with increased DOE noted requiring increased time for recovery. Pt then transported back to room and with increased time and cues for set up was able to complete Sit to stand with modA from w/c. Pt completed ambulatory transfer to bed and required heavy minA for BLE management to complete sit to supine. In supine pt was able to use bed features and use of LLE to boost self to [facility]. Pt then completed rolling L/R with supervision to adjust chuck pad. Pt requesting to end session early due to fatigue. Pt left in bed at end of session with bed alarm on, call bell within reach and needs met. Pt missed 15 min skilled PT due to fatigue.       Therapy Documentation Precautions:  Precautions Precautions: Fall Precaution Comments: watch BP and HR Restrictions Weight Bearing Restrictions  Per Provider Order: No RUE Weight Bearing Per Provider Order: Weight bearing as tolerated General: PT Amount of Missed Time (min): 15 Minutes PT Missed Treatment Reason: Patient fatigue Vital Signs:   Pain: Pain Assessment Pain Scale: 0-10 Pain Score: 8  Pain Type: Acute pain Pain Location: Leg Pain Orientation: Right Pain Descriptors / Indicators: Aching Pain Intervention(s): Medication (See eMAR) Mobility:   Locomotion :    Trunk/Postural Assessment :    Balance:   Exercises:   Other Treatments:      Therapy/Group: Individual Therapy  Kiel Cockerell 04/17/2023, 12:10 PM

## 2023-04-17 NOTE — Progress Notes (Signed)
Occupational Therapy Session Note  Patient Details  Name: [name] [name] MRN: [id number] Date of Birth: 01/23/1946  Today's Date: 04/17/2023 OT Individual Time: 2130-8657 OT Individual Time Calculation (min): 72 min    Short Term Goals: Week 2:  OT Short Term Goal 1 (Week 2): LB dressing with AE and min A OT Short Term Goal 2 (Week 2): Standing 2 min sink side with LRAD for grooming with min A OT Short Term Goal 3 (Week 2): Toilet/stall shower transfer with LRAD and min A  Skilled Therapeutic Interventions/Progress Updates:  Pt bed level upon OT arrival. Wife bedside for discharge planning education. D/c date extended to 12/30. Secure chat to team as now pt will need a BSC. Pt and wife thought they had one but its a toilet riser. OT inquired  about the stair lift and it is delayed till mid Jan. Pt will need to stay on the 1st floor initially. Discussed rental of a recliner/lift chair and potential hospital bed in addition to the w/c with cushion as they said they would just have him try the sofa. Pt has a sacral and heel wound so sofa sleeping would be inappropriate. Discussed potential and wife and pt are interested in the recliner lift chair and wife will call [facility]. Pt requested toileting and performed supine to sit with LE mngt with min a, SPT on BSC with mod A with RW and mod A fading to min A for sit to stand and SPT back to bed. Mod A for peri and buttocks hygiene. Sit to supine with mod A. B UE red tband issued with triceps press 10 reps x 3 sets. Left pt bed level with wife bedside, needs and nurse call button in reach.   Pain: 5/10 R LE with elevation and repositioning with ice pack at end of session  Therapy Documentation Precautions:  Precautions Precautions: Fall Precaution Comments: watch BP and HR Restrictions Weight Bearing Restrictions Per Provider Order: No RUE Weight Bearing Per Provider Order: Weight bearing as tolerated   Therapy/Group: Individual  Therapy  Vicenta Dunning 04/17/2023, 7:57 AM

## 2023-04-18 DIAGNOSIS — R5381 Other malaise: Secondary | ICD-10-CM | POA: Diagnosis not present

## 2023-04-18 LAB — CBC WITH DIFFERENTIAL/PLATELET
Abs Immature Granulocytes: 0.14 10*3/uL — ABNORMAL HIGH (ref 0.00–0.07)
Basophils Absolute: 0.1 10*3/uL (ref 0.0–0.1)
Basophils Relative: 1 %
Eosinophils Absolute: 0.1 10*3/uL (ref 0.0–0.5)
Eosinophils Relative: 1 %
HCT: 30.1 % — ABNORMAL LOW (ref 39.0–52.0)
Hemoglobin: 10 g/dL — ABNORMAL LOW (ref 13.0–17.0)
Immature Granulocytes: 1 %
Lymphocytes Relative: 7 %
Lymphs Abs: 1 10*3/uL (ref 0.7–4.0)
MCH: 26.6 pg (ref 26.0–34.0)
MCHC: 33.2 g/dL (ref 30.0–36.0)
MCV: 80.1 fL (ref 80.0–100.0)
Monocytes Absolute: 0.5 10*3/uL (ref 0.1–1.0)
Monocytes Relative: 4 %
Neutro Abs: 11.3 10*3/uL — ABNORMAL HIGH (ref 1.7–7.7)
Neutrophils Relative %: 86 %
Platelets: 662 10*3/uL — ABNORMAL HIGH (ref 150–400)
RBC: 3.76 MIL/uL — ABNORMAL LOW (ref 4.22–5.81)
RDW: 20.2 % — ABNORMAL HIGH (ref 11.5–15.5)
WBC: 13.1 10*3/uL — ABNORMAL HIGH (ref 4.0–10.5)
nRBC: 0 % (ref 0.0–0.2)

## 2023-04-18 LAB — COMPREHENSIVE METABOLIC PANEL
ALT: 41 U/L (ref 0–44)
AST: 46 U/L — ABNORMAL HIGH (ref 15–41)
Albumin: 1.7 g/dL — ABNORMAL LOW (ref 3.5–5.0)
Alkaline Phosphatase: 112 U/L (ref 38–126)
Anion gap: 10 (ref 5–15)
BUN: 37 mg/dL — ABNORMAL HIGH (ref 8–23)
CO2: 27 mmol/L (ref 22–32)
Calcium: 7.7 mg/dL — ABNORMAL LOW (ref 8.9–10.3)
Chloride: 93 mmol/L — ABNORMAL LOW (ref 98–111)
Creatinine, Ser: 2.52 mg/dL — ABNORMAL HIGH (ref 0.61–1.24)
GFR, Estimated: 26 mL/min — ABNORMAL LOW (ref >60)
Glucose, Bld: 122 mg/dL — ABNORMAL HIGH (ref 70–99)
Potassium: 3.6 mmol/L (ref 3.5–5.1)
Sodium: 130 mmol/L — ABNORMAL LOW (ref 135–145)
Total Bilirubin: 1.2 mg/dL — ABNORMAL HIGH (ref <1.2)
Total Protein: 4.7 g/dL — ABNORMAL LOW (ref 6.5–8.1)

## 2023-04-18 LAB — GLUCOSE, CAPILLARY
Glucose-Capillary: 120 mg/dL — ABNORMAL HIGH (ref 70–99)
Glucose-Capillary: 179 mg/dL — ABNORMAL HIGH (ref 70–99)
Glucose-Capillary: 136 mg/dL — ABNORMAL HIGH (ref 70–99)
Glucose-Capillary: 175 mg/dL — ABNORMAL HIGH (ref 70–99)

## 2023-04-18 MED ORDER — METHOCARBAMOL 500 MG PO TABS
500.0000 mg | ORAL_TABLET | Freq: Three times a day (TID) | ORAL | Status: DC
  Administered 2023-04-18 – 2023-04-19 (×5): 500 mg via ORAL
  Filled 2023-04-18 (×5): qty 1

## 2023-04-18 MED ORDER — OXYCODONE HCL 5 MG PO TABS
2.5000 mg | ORAL_TABLET | Freq: Four times a day (QID) | ORAL | Status: DC | PRN
  Administered 2023-04-18 – 2023-04-20 (×2): 2.5 mg via ORAL
  Filled 2023-04-18 (×2): qty 1

## 2023-04-18 MED ORDER — OYSTER SHELL CALCIUM/D3 500-5 MG-MCG PO TABS
1.0000 | ORAL_TABLET | Freq: Two times a day (BID) | ORAL | Status: DC
  Administered 2023-04-18 – 2023-04-20 (×4): 1 via ORAL
  Filled 2023-04-18 (×4): qty 1

## 2023-04-18 MED ORDER — POLYETHYLENE GLYCOL 3350 17 G PO PACK
17.0000 g | PACK | Freq: Every day | ORAL | Status: DC
  Filled 2023-04-18 (×3): qty 1

## 2023-04-18 MED ORDER — SENNOSIDES-DOCUSATE SODIUM 8.6-50 MG PO TABS
2.0000 | ORAL_TABLET | Freq: Two times a day (BID) | ORAL | Status: DC
  Administered 2023-04-18 – 2023-04-20 (×4): 2 via ORAL
  Filled 2023-04-18 (×5): qty 2

## 2023-04-18 NOTE — Progress Notes (Signed)
Patient ID: [name] [name], male   DOB: 1945-12-27, 77 y.o.   MRN: [id number]  HHPT/OT/aide referral sent to [facility]. SW waiting on follow-up now.   Cecile Sheerer, MSW, LCSW Office: [phone] Cell: [phone] Fax: [phone]

## 2023-04-18 NOTE — Progress Notes (Signed)
Occupational Therapy Session Note  Patient Details  Name: [name] [name] MRN: [id number] Date of Birth: [birth year]  Today's Date: 04/18/2023 OT Individual Time: 4098-1191 OT Individual Time Calculation (min): 60 min    Short Term Goals: Week 2:  OT Short Term Goal 1 (Week 2): LB dressing with AE and min A OT Short Term Goal 2 (Week 2): Standing 2 min sink side with LRAD for grooming with min A OT Short Term Goal 3 (Week 2): Toilet/stall shower transfer with LRAD and min A  Skilled Therapeutic Interventions/Progress Updates:   Pt open to all skilled OT interventions. Pt bed level with OT applying ACE wraps for BP management. Baseline BP 116/72. Bed features set and moved to EOB with improved R LE management with CGA. Sit to stand min A from elevated surface and 4 steps around to transfer to w/c with min A. Sink side oral care and shaving seated with set up. Stood 1.5 min for hair combing with min A for balance. Transported pt to and from ortho gym w/c level for energy conservation and pt able to complete theraball core strength with forward pushes 10 reps x 2 and circular x 2. BORG scale introduced. Pt left w/c level for next PT session with BP 109/72. Needs and safety measures in place. Pain meds given by nursing.   Pain: 3/10 R LE in w/c 0/10 bed level, pain meds requested prior to PT  Therapy Documentation Precautions:  Precautions Precautions: Fall Precaution Comments: watch BP and HR Restrictions Weight Bearing Restrictions Per Provider Order: No RUE Weight Bearing Per Provider Order: Weight bearing as tolerated   Therapy/Group: Individual Therapy  Vicenta Dunning 04/18/2023, 7:56 AM

## 2023-04-18 NOTE — Progress Notes (Addendum)
PROGRESS NOTE   Subjective/Complaints:  No events overnight.  No acute complaints. Denies any symptoms of vertigo, orthostasis other than first thing in the morning. Took sorbitol yesterday morning, with medium results. Agreeable to reducing pain medications today. Patient requesting liberalization of fluid restriction.  ROS: Right lower extremity lateral thigh burning pain,-resolved;  aching-ongoing +vertigo/dizziness/orthostasis-improving, +insomnia-improved with medication  denies fevers, chills, N/V, abdominal pain, constipation, diarrhea, SOB, cough, chest pain, new weakness or paraesthesias.    Objective:   No results found.  Recent Labs    04/18/23 0554  WBC 13.1*  HGB 10.0*  HCT 30.1*  PLT 662*   Recent Labs    04/18/23 0554  NA 130*  K 3.6  CL 93*  CO2 27  GLUCOSE 122*  BUN 37*  CREATININE 2.52*  CALCIUM 7.7*    Intake/Output Summary (Last 24 hours) at 04/18/2023 0913 Last data filed at 04/18/2023 0500 Gross per 24 hour  Intake 480 ml  Output 300 ml  Net 180 ml      Pressure Injury 04/08/23 Ankle Posterior;Right Stage 2 -  Partial thickness loss of dermis presenting as a shallow open injury with a red, pink wound bed without slough. unattached edges scant drainage. This LDA replaces one from 03/03/23 that was entered (Active)  04/08/23 1730  Location: Ankle  Location Orientation: Posterior;Right  Staging: Stage 2 -  Partial thickness loss of dermis presenting as a shallow open injury with a red, pink wound bed without slough.  Wound Description (Comments): unattached edges scant drainage. This LDA replaces one from 03/03/23 that was entered inaccurately as a blister that had opened.  Present on Admission: Yes     Pressure Injury 04/14/23 Heel Left;Lateral Deep Tissue Pressure Injury - Purple or maroon localized area of discolored intact skin or blood-filled blister due to damage of underlying soft  tissue from pressure and/or shear. (Active)  04/14/23 1010  Location: Heel  Location Orientation: Left;Lateral  Staging: Deep Tissue Pressure Injury - Purple or maroon localized area of discolored intact skin or blood-filled blister due to damage of underlying soft tissue from pressure and/or shear.  Wound Description (Comments):   Present on Admission: No    Physical Exam: Vital Signs Blood pressure 126/72, pulse (!) 102, temperature 97.6 F (36.4 C), resp. rate 15, height 6\' 2"  (1.88 m), weight 75 kg, SpO2 93%.   PE: Constitution: Appropriate appearance for age. No apparent distress.  Seen sitting upright in wheelchair. HEENT: [place] grossly intact.  Glasses donned. Resp: No respiratory distress. No accessory muscle usage. on RA and CTAB Cardio: Well perfused appearance.  2-3+ pitting edema at the right thigh, warm.  Legs wrapped in Ace bandages.   Abdomen: Nondistended. Nontender.  Nondistended.  Positive bowel sounds, normoactive. Psych: Appropriate mood and affect.  Mildly anxious. Skin:  - Mepilex dressing to right heel, sacral heart to low back for DTI's, unstageable-not examined  Neurologic Exam:   Awake, alert, and oriented x 3.  No apparent cognitive deficits. Sensory exam: revealed normal sensation in all dermatomal regions in bilateral upper and lower extremities Motor exam: Antigravity and against resistance in all 4 extremities; right hip knee extension limited due to pain.  coordination: Fine motor coordination was normal.             Assessment/Plan: 1. Functional deficits which require 3+ hours per day of interdisciplinary therapy in a comprehensive inpatient rehab setting. Physiatrist is providing close team supervision and 24 hour management of active medical problems listed below. Physiatrist and rehab team continue to assess barriers to discharge/monitor patient progress toward functional and medical goals  Care Tool:  Bathing    Body parts  bathed by patient: Right arm, Left arm, Chest, Face, Abdomen   Body parts bathed by helper: Front perineal area, Right lower leg, Left upper leg, Right upper leg, Buttocks, Left lower leg     Bathing assist Assist Level: Total Assistance - Patient < 25%     Upper Body Dressing/Undressing Upper body dressing   What is the patient wearing?: Pull over shirt    Upper body assist Assist Level: Contact Guard/Touching assist    Lower Body Dressing/Undressing Lower body dressing      What is the patient wearing?: Pants     Lower body assist Assist for lower body dressing: Total Assistance - Patient < 25%     Toileting Toileting    Toileting assist Assist for toileting: Maximal Assistance - Patient 25 - 49%     Transfers Chair/bed transfer  Transfers assist  Chair/bed transfer activity did not occur: Safety/medical concerns (unsafe to get up)  Chair/bed transfer assist level: Minimal Assistance - Patient > 75%     Locomotion Ambulation   Ambulation assist   Ambulation activity did not occur: Safety/medical concerns (amb 8' in //bars with min assist and +2 for WC follow)  Assist level: Minimal Assistance - Patient > 75% Assistive device: Walker-rolling Max distance: 22'   Walk 10 feet activity   Assist  Walk 10 feet activity did not occur: Safety/medical concerns  Assist level: Minimal Assistance - Patient > 75% Assistive device: Walker-rolling   Walk 50 feet activity   Assist Walk 50 feet with 2 turns activity did not occur: Safety/medical concerns         Walk 150 feet activity   Assist Walk 150 feet activity did not occur: Safety/medical concerns         Walk 10 feet on uneven surface  activity   Assist Walk 10 feet on uneven surfaces activity did not occur: Safety/medical concerns         Wheelchair     Assist Is the patient using a wheelchair?: Yes Type of Wheelchair: Manual    Wheelchair assist level: Contact Guard/Touching  assist Max wheelchair distance: 54'    Wheelchair 50 feet with 2 turns activity    Assist        Assist Level: Contact Guard/Touching assist   Wheelchair 150 feet activity     Assist      Assist Level: Moderate Assistance - Patient 50 - 74%   Blood pressure 126/72, pulse (!) 102, temperature 97.6 F (36.4 C), resp. rate 15, height 6\' 2"  (1.88 m), weight 75 kg, SpO2 93%.  Medical Problem List and Plan: 1. Functional deficits secondary to retroperitoneal hematoma after recent fall.             -also right right AC jt injury, ?clavicle fx             -patient may  shower  -Stable to continue CIR             -ELOS/Goals: 10-12 days, supervision goals with PT, OT -  DC date goal extended to 12/30   - 12/10: Family installing chair lift. Has HH in place. Functionally limited by RLE pain.  - 12/17: Limited by orthostasis per therapies, and decreased insight. CGA LBD. Hasn't made much progress with PT. Walker 22 feet Min A with RW yesterday. RLE pain limiting but improving. Self-limiting.  CGA ambulatory goals. May need WC at discharge.    2.  Antithrombotics: -DVT/anticoagulation:  Pharmaceutical: Eliquis 5mg  BID             -antiplatelet therapy: none             -pt with new right lower extremity edema and thigh pain. Dopplers pending                         -eliquis recently resumed, so findings won't change medical mgt 3. Pain Management: Tylenol, oxycodone as needed for hip  -right thigh pain. ?muscle strain. No sensory findings. Most recent CT reviewed.  It's possible he's had further migration of blood further down into the thigh as there was blood along the distal iliopsoas on that CT                         -add prn robaxin                         -order kpad                          -dopplers pending as above - 12/10: Per OT Max 2 OOB due to leg pain.  On exam,?  Meralgia paresthetica based on distribution, will add gabapentin 100 mg 3 times daily due to renal  deficiency.  Increase oxycodone to 5 to 10 mg as needed.  Not scheduled Tylenol due to elevated LFTs. -12/12: Episode of dizziness a.m., had taken a.m. oxycodone several hours prior.  Decrease as needed to 5 mg every 6 hours as needed.  DC gabapentin. - 12-13: Dizziness not recurrent.  Start Robaxin 750 mg 3 times daily for ongoing thigh pain. - 12-16: LFTs essentially normalized.  Order standing Tylenol 1000 mg 3 times daily 12-17: Pain improving.  Patient wants just to stay on current regimen for 1 to 2 days prior to down titrating. 12/19: Reduce scheduled Robaxin to 500 mg 3 times daily.  Reduce as needed oxycodone to 2.5 mg for severe pain.  4. Mood/Behavior/Sleep: LCSW to evaluate and provide emotional support             -continue melatonin 10mg  (ordered as 5mg  PRN though?)             -antipsychotic agents: n/a -04/14/23 pt slept poorly, takes 5-10mg  ambien at home; d/t age, will start 5mg  dose PRN, advised he has to ask for it; melatonin PRN still in place, for if ambien doesn't help.  12-16: Used both PRNs yesterday.  Still having panic attacks and severe anxiety per nursing.  Order standing BuSpar 5 mg 3 times daily.  Would benefit from neuropsych consult 12-17: Monitor BuSpar 1 to 2 days before increasing. 12-18, remains anxious, but without panic attacks.  Does not appear to be self-limiting during his transfers, simply deconditioned.  Monitor on current medications.   5. Neuropsych/cognition: This patient is capable of making decisions on his own behalf. - 12/10: SLP consult for reported cognitive deficits; may be medication induced per wife  12-18: Per teams, cognitive baseline.  6. Skin/Wound Care: Routine skin care checks   - Stage 2 to sacrum reported-now unstageable 12-11: Unstageable found on right heel.  Added Medihoney to heel and low back, changed to low-air-loss mattress - WOCN consulted 12/11 for recs   - Lateral trunk rash managed with steroid creams essentially  resolved   -12-13: Left antecubital IV infiltration.  Ice and compression to area.  Resolved  7. Fluids/Electrolytes/Nutrition/hyponatremia: Strict Is and Os and follow-up chemistries   - 12/10: Fluid restriction for mild hyponatremia today.  Repeat tomorrow a.m. - 12/11: Na 129 today; was not compliant with fluid restriction yesterday. Continue today and repeat in AM tomorrow; if no improvement will need to start gentle IVF. Add 40 meq daily potassium.  12-12: NA 131, improved.  Potassium normalized.  DC supplementation of potassium given all priors were normal on same dose of torsemide.  - 04/13/23: NA 130, BUN/creatinine stable, same as yesterday.  Monitor - 12-19: NA 130, BUN/creatinine slightly increased.  Start Os-Cal 1 tab twice daily for hypocalcemia less than 8.  Liberalizing fluids per patient request; repeat BMP in AM with magnesium, Phos, and vitamin D.  8: Hypertension: monitor TID and prn (Entresto, torsemide held)>> still on torsemide 20mg  daily             -currently on midodrine 10 mg TID for hypotension -12-12: Slightly hypotensive, on fluid restriction with diuresis, would continue midodrine for now.  Questionable orthostatic episode today, symptoms more vertiginous, treatment as below.  12-13: BP improved, no further symptoms orthostasis, monitor  -12/14-15/24 BPs great, monitor  12-16: Decrease midodrine to 5 mg twice daily.  Wean gradually over the next few days if tolerated.  Has TED hose.  12/17: increase midodrine back to 7.5 twice daily due to symptomatic orthostasis with therapies; question more due to deconditioning due to tendency to happen at end of ambulation.  12/18: Normotensive.   12/19: Blood pressure stable, but tachycardic with orthostatic vitals into the 120s. Vitals:   04/14/23 1912 04/15/23 0515 04/15/23 1403 04/15/23 1917  BP: 129/66 121/69 132/75 (!) 109/59   04/16/23 0615 04/16/23 1329 04/16/23 2013 04/17/23 0608  BP: 128/70 120/89 115/77 124/63    04/17/23 0620 04/17/23 1452 04/17/23 1919 04/18/23 0456  BP: 112/65 128/81 121/62 126/72     9:  Cardiorenal syndrome/acute kidney Injury on chronic kidney disease stage IIIb (baseline creatinine around 2)              -milrinone discontinued 12/08             -no indication for HD             -daily weight             -continue sodium bicarbonate 650 mg BID             -continue torsemide 20 mg daily  12/10-14: Renal function appears stable.   -04/14/23 Cr up to 2.25 today, baseline is 2 so will just encourage PO intake for now, but monitor closely 12-16: Creatinine down to 2.0.  Back to baseline. 12/19: Creatinine back up to 2.5, liberalizing fluids as above and repeat in a.m.    10: Fall/right retroperitoneal hematoma: now back on Eliquis; H and H stable             -follow-up CBC - stable   11: Atypical atrial flutter; hx of pAF: rate controlled (low 100s)             -  on Eliquis 5mg  BID, amiodarone 200mg  daily  -Heart rate stable, does have some variation into the low 100s to 120s intermittently.   12: COPD: former tobacco smoker, quit 2018   13: DM-2: CBGs QID; A1c = 6.9% (home on Ozempic; metformin discontinued due to CKD)             -continue SSI  Blood sugars have remained relatively stable -04/13/23 CBGs variable, many >200, would probably benefit from some optimization, but defer to weekday team to discuss options -04/14/23 CBGs a bit better but still might benefit from further optimization 12-17: BG is well-controlled.  Monitor. 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  I's and O's remain significantly positive.  Added TED hose for bilateral lower extremity edema; weights remain stable, no symptoms of volume overload. 12/12-17: Weights stable.  Can't tolerate TEDs, advised we could ACE wrap his RLE to help with the swelling; pt will consider it - done 12/17 12-18: Weight significantly down, likely an error, edema around thigh appears approximately the same.  CMP tomorrow a.m. 12-19: No weights overnight.  Clinically volume stable. Filed Weights   04/14/23 0530 04/15/23 0629 04/17/23 0608  Weight: 89.7 kg 89.6 kg 75 kg      16: Elevated transaminases, up trending: ? due to congestive hepatopathy             -follow-up CMP-downtrending, but persistent, f/up Monday   17: Multiple falls; AC joint injury/possible right distal clavicular fracture 11/05   18: Possible pneumonia: continue Augmentin to finish the course   - 12/10:  Repeat chest x-ray without consolidation  - 12/11: Augmentin stopped per ID recommendations  19: Leukocytosis, chronic: negative infectious work-up except possible PNA; had short course steroids for skin rash             -appears to be off prednisone now             -on Augmentin (end 12/11)             -follow-up CBC with diff on 12/10 12/10: Uptrending leukocytosis 28.3 -> 37.8 -> 43.7 despite stopping p.o. steroids on 12-7.  Extensive inpatient workup for leukocytosis described in H&P.  Does have chronic underlying leukocytosis in the 20s.  Will consult infectious disease for additional workup/ideas today, feel less likely reactive given that thrombocytosis is improving and other parameters remained stable.  12/11: Mildly decreased to 35 today. Remains afebrile. ID consulted yesterday, recommending DC augmentin, daily monitorring, and drainage of psoas fluid collection if no improvement. Appreciate their insight  and recommendations. 12-12: WBC back up to 40, thrombocytosis mildly uptrending.  Afebrile, per infectious disease aspiration of right hip fluid performed and cultures pending.  Appreciate their recommendations and management.  12-13: 300 cc hip aspirate with multiple WBCs and PMNs.  Patient started on vancomycin per infectious disease.  Monitor for final culture results.--Lactobacillus contaminant 12-16: Culture grew contaminant as above.  WBC is downtrending.  Stop vancomycin, repeat CBC every 72 hours and reconsult ID as needed.  Very much appreciate their assistance in the management of this patient. Repeat CBC 12-19-WBC continues to downtrend.  20: Lung nodule: follow-up outpatient with PCP   21: Hx of bladder cancer s/p TURP 2013   22: Skin rash treated with prednisone/hydroxyzine/calamine lotion             -appears prednisone has been discontinued-rash essentially resolved.   23: Chronic anemia: H and H stable times 72 hours             -follow-up CBC-stable 12/14   24: Thrombocytosis; fluctuating: follow-up CBC.  Ongoing..   25. Vertigo/dizziness.  Started 12-12, no nystagmus on exam. -orthostatic vitals every morning -Added as needed meclizine 12.5 mg twice daily-discontinued due to no use 12-18 -Possible symptoms are secondary to medication burden, would wean down pain medications as tolerated -12-13: Improved, no recurrent symptoms endorsed 12-17: Increasing midodrine as above.  Seems more due to deconditioning, monitor. 12-19: Patient reports only in a.m., seems more related to tachycardia than blood pressure on orthostatic vitals.  Liberalizing fluids as above, keep torsemide at same dose for now.  If no improvement in symptoms, will need cardiology consult to help with medication adjustment  26.  Constipation.  Increase sorbitol to 2 tabs twice daily, add daily MiraLAX. -Last bowel movement 12-18 with sorbitol   LOS: 10 days A FACE TO FACE EVALUATION WAS  PERFORMED  Angelina Sheriff 04/18/2023, 9:13 AM

## 2023-04-18 NOTE — Progress Notes (Signed)
Physical Therapy Session Note  Patient Details  Name: [name] [name] MRN: [id number] Date of Birth: [birth year]  Today's Date: 04/18/2023 PT Individual Time: 1030-1130 + [id number] PT Individual Time Calculation (min): 60 min + 71 min  Short Term Goals: Week 2:  PT Short Term Goal 1 (Week 2): Pt will consistently complete sit<>stands with CGA PT Short Term Goal 2 (Week 2): Pt will ambulate 68' with min assist with LRAD PT Short Term Goal 3 (Week 2): Pt will complete bed mobility with CGA consistently PT Short Term Goal 4 (Week 2): Pt will complete up/down 1 step with min assist  Skilled Therapeutic Interventions/Progress Updates:     SESSION 1: Pt presents in room in [facility], agreeable to PT. Pt denies pain at rest and states pain is improving overall. Pt denies dizziness in sitting at rest, ace wraps donned. Session focused on vitals management with education on HR vs BP, transfer training and bed mobility training, therapeutic exercise to promote BUE/BLE strengthening, endurance, and RLE ROM.  Pt transported from room to day room dependently for time management. Pt vitals assessed in sitting to be BP 125/80, HR 115bpm, attempt to take with pt in standing, pt demonstrating decreased tolerance to upright and requests to sit prior to assessment being completed with pt demonstrating BP 123/100, HR inconclusive due to interference with reading. Pt manual HR taken and noted to be ~115 bpm. Pt educated on elevated HR at rest contributing to dizziness and fatigue symptoms with pt verbalizing understanding. Pt transferred to nustep with min assist with RW, improved anterior trunk wt shift and gluteal clearance.  Pt completes interval training on nustep with BUE/BLE work/58min rest for total 12 minutes at workload 5. Pt demonstrating decreased RLE hip flexion ROM initially, improves throughout exercise and pt denies pain. Pt completes stand pivot transfer back to [facility], vitals reassessed to be BP 114/60,  HR 127 bpm.  Pt returned to room via [facility] for time management. Pt completes short distance ambulatory transfer from [facility] back to bed with min assist for stand, min assist for gait and positioning. Pt able to complete sit to supine with min assist for BLE mgmt with pt able to cross LLE underneath RLE for compensatory strategy to place BLEs into bed.  Pt then completes supine therex for BLE strengthening and ROM including: - hip flexion x10 BLE (RLE AAROM) - hip abduction x10 BLE - bridges x10  Pt remains in supine with all needs within reach, call light in place, pt requesting four bed rails up to assist with repositioning in bed at end of session.  Vital signs: - sitting prior to activity: BP 125/80, HR 115bpm - following standing: BP 123/100, HR inconclusive due to poor reading - after nustep and transfer back to WC: BP 114/60, HR 127bpm   SESSION 2: Pt presents in room in bed, agreeable to PT. Pt denies pain at this time. Session focused on vitals management with vitals taken in supine, sitting, following transfer, and after ambulation, transfer training, bed mobility training, gait training, and therapeutic exercise for BLE/BUE strengthening and endurance.  Pt completes bed mobility with CGA! From supine to sit EOB, increased time with trunk to upright and requires CGA for immediate sitting balance, demonstrating improved ability to manage BLEs off bed, requires assist to elevate bed for improved BLE positioning prior to stand. Pt vitals assessed in sitting EOB, noted below. Pt completes sit to stand and stand step transfer with min assist to [facility], vitals assessed post  transfer as pt unable to tolerate time in upright. Pt transported from room to main gym dependently for time management.  Pt ambulates with RW min assist 52' with 2nd person WC follow, decreased step length with RLE, improved proximity to RW however continues to demonstrate increased trunk flexion with fatigue. Pt reporting  dizziness or "fogginess" towards end of gait trial, pt returns to sitting and vitals reassessed.  Pt takes extended seated rest break during which time pt RN administers medications. Pt reports resolution of "fogginess" with increased time in sitting. Pt positioned in WC in front of steps for step negotiation practice. Pt completes step up/down one 3" step x4 reps with BHRs and LLE only, pt demonstrating some knee instabilitly on RLE with stepping down from step transitioning LLE to floor. Pt takes seated rest break then completes step taps to 3" step x10 with RLE with BUE support on BHRs for RLE hip flexor strengthening needed for transfers and ambulation.  Pt then self propels WC with BUEs 100' with supervision, decreased propulsion speed, completed to promote BUE endurance and improved independence with mobility.  Pt then completes interval training on UE ergometer to promote BUE strengthening and endurance needed for functional ambulation and transfers, completes 1 min work, 1 min rest for 3 rounds forward and 3 rounds retro and workload level 1. Pt reporting "fogginess" with fatigue towards end of interval that subsides with rest break interval.  Pt transported back to room at end of session and completes ambulatory transfer with RW 6' back to bed with cues for positioning prior to sitting, min assist. Pt completes sit to supine with min assist for BLE management and repositioning in supine. Therapist unwraps ace wraps with pt in supine and pt remains in supine with all needs within reach, call light in place, and pt wife at bedside at end of session.  Vitals: Supine: BP 127/77, HR 108 bpm Sitting EOB: BP 127/76, HR 114 bpm Sitting following transfer to WC: BP 111/68, HR 120 bpm Sitting following ambulation (pt reporting "fogginess" and dizziness): BP 110/77, HR 120 bpm  Therapy Documentation Precautions:  Precautions Precautions: Fall Precaution Comments: watch BP and  HR Restrictions Weight Bearing Restrictions Per Provider Order: No RUE Weight Bearing Per Provider Order: Weight bearing as tolerated    Therapy/Group: Individual Therapy  Edwin Cap PT, DPT 04/18/2023, 12:32 PM

## 2023-04-19 ENCOUNTER — Other Ambulatory Visit: Payer: Self-pay

## 2023-04-19 ENCOUNTER — Inpatient Hospital Stay (HOSPITAL_COMMUNITY): Payer: Medicare Other

## 2023-04-19 ENCOUNTER — Encounter (HOSPITAL_COMMUNITY): Payer: Medicare Other | Admitting: Internal Medicine

## 2023-04-19 DIAGNOSIS — M79661 Pain in right lower leg: Secondary | ICD-10-CM | POA: Diagnosis not present

## 2023-04-19 LAB — CBC WITH DIFFERENTIAL/PLATELET
Abs Immature Granulocytes: 0.12 10*3/uL — ABNORMAL HIGH (ref 0.00–0.07)
Basophils Absolute: 0.1 10*3/uL (ref 0.0–0.1)
Basophils Relative: 1 %
Eosinophils Absolute: 0 10*3/uL (ref 0.0–0.5)
Eosinophils Relative: 0 %
HCT: 31.4 % — ABNORMAL LOW (ref 39.0–52.0)
Hemoglobin: 10.3 g/dL — ABNORMAL LOW (ref 13.0–17.0)
Immature Granulocytes: 1 %
Lymphocytes Relative: 7 %
Lymphs Abs: 0.9 10*3/uL (ref 0.7–4.0)
MCH: 26.5 pg (ref 26.0–34.0)
MCHC: 32.8 g/dL (ref 30.0–36.0)
MCV: 80.7 fL (ref 80.0–100.0)
Monocytes Absolute: 0.6 10*3/uL (ref 0.1–1.0)
Monocytes Relative: 5 %
Neutro Abs: 12 10*3/uL — ABNORMAL HIGH (ref 1.7–7.7)
Neutrophils Relative %: 86 %
Platelets: 645 10*3/uL — ABNORMAL HIGH (ref 150–400)
RBC: 3.89 MIL/uL — ABNORMAL LOW (ref 4.22–5.81)
RDW: 20.5 % — ABNORMAL HIGH (ref 11.5–15.5)
WBC: 13.8 10*3/uL — ABNORMAL HIGH (ref 4.0–10.5)
nRBC: 0 % (ref 0.0–0.2)

## 2023-04-19 LAB — BASIC METABOLIC PANEL
Anion gap: 9 (ref 5–15)
BUN: 36 mg/dL — ABNORMAL HIGH (ref 8–23)
CO2: 28 mmol/L (ref 22–32)
Calcium: 8 mg/dL — ABNORMAL LOW (ref 8.9–10.3)
Chloride: 94 mmol/L — ABNORMAL LOW (ref 98–111)
Creatinine, Ser: 2.3 mg/dL — ABNORMAL HIGH (ref 0.61–1.24)
GFR, Estimated: 29 mL/min — ABNORMAL LOW (ref >60)
Glucose, Bld: 111 mg/dL — ABNORMAL HIGH (ref 70–99)
Potassium: 4 mmol/L (ref 3.5–5.1)
Sodium: 131 mmol/L — ABNORMAL LOW (ref 135–145)

## 2023-04-19 LAB — GLUCOSE, CAPILLARY
Glucose-Capillary: 104 mg/dL — ABNORMAL HIGH (ref 70–99)
Glucose-Capillary: 149 mg/dL — ABNORMAL HIGH (ref 70–99)
Glucose-Capillary: 151 mg/dL — ABNORMAL HIGH (ref 70–99)
Glucose-Capillary: 169 mg/dL — ABNORMAL HIGH (ref 70–99)

## 2023-04-19 LAB — PHOSPHORUS: Phosphorus: 5 mg/dL — ABNORMAL HIGH (ref 2.5–4.6)

## 2023-04-19 LAB — MAGNESIUM: Magnesium: 1.9 mg/dL (ref 1.7–2.4)

## 2023-04-19 LAB — VITAMIN D 25 HYDROXY (VIT D DEFICIENCY, FRACTURES): Vit D, 25-Hydroxy: 36.44 ng/mL (ref 30–100)

## 2023-04-19 MED ORDER — METHOCARBAMOL 500 MG PO TABS
500.0000 mg | ORAL_TABLET | Freq: Three times a day (TID) | ORAL | Status: DC | PRN
  Administered 2023-04-20: 500 mg via ORAL
  Filled 2023-04-19: qty 1

## 2023-04-19 MED ORDER — MIDODRINE HCL 5 MG PO TABS
5.0000 mg | ORAL_TABLET | Freq: Three times a day (TID) | ORAL | Status: DC
  Administered 2023-04-19: 5 mg via ORAL
  Filled 2023-04-19: qty 1

## 2023-04-19 MED ORDER — MIDODRINE HCL 5 MG PO TABS
2.5000 mg | ORAL_TABLET | Freq: Three times a day (TID) | ORAL | Status: DC
  Administered 2023-04-19 – 2023-04-20 (×2): 2.5 mg via ORAL
  Filled 2023-04-19 (×2): qty 1

## 2023-04-19 MED ORDER — FUROSEMIDE 10 MG/ML IJ SOLN
20.0000 mg | Freq: Once | INTRAMUSCULAR | Status: AC
  Administered 2023-04-19: 20 mg via INTRAVENOUS
  Filled 2023-04-19: qty 2

## 2023-04-19 MED ORDER — FUROSEMIDE 10 MG/ML IJ SOLN
20.0000 mg | Freq: Once | INTRAMUSCULAR | Status: AC
Start: 2023-04-19 — End: 2023-04-19
  Administered 2023-04-19: 20 mg via INTRAVENOUS
  Filled 2023-04-19: qty 2

## 2023-04-19 NOTE — Progress Notes (Signed)
   04/19/23 2055  Assess: MEWS Score  Temp 97.7 F (36.5 C)  BP (!) 141/79  MAP (mmHg) 96  Pulse Rate (!) 112 (107 Dinamap)  Resp (!) 22  SpO2 95 %  O2 Device Nasal Cannula  O2 Flow Rate (L/min) 3 L/min  Assess: MEWS Score  MEWS Temp 0  MEWS Systolic 0  MEWS Pulse 2  MEWS RR 1  MEWS LOC 0  MEWS Score 3  MEWS Score Color Yellow  Assess: if the MEWS score is Yellow or Red  Were vital signs accurate and taken at a resting state? Yes  Does the patient meet 2 or more of the SIRS criteria? Yes  Does the patient have a confirmed or suspected source of infection? No  MEWS guidelines implemented  No, previously yellow, continue vital signs every 4 hours  Notify: Charge Nurse/RN  Name of Charge Nurse/RN Notified Alfredo Martinez, RN  Provider Notification  Provider Name/Title [address] [place], [place]  Date Provider Notified 04/19/23  Time Provider Notified 2100  Method of Notification Call  Notification Reason Change in status;Requested by patient/family  Provider response See new orders  Date of Provider Response 04/19/23  Time of Provider Response 2100  Notify: Rapid Response  Name of Rapid Response RN Notified Jeniffer Motley, RN  Date Rapid Response Notified 04/19/23  Time Rapid Response Notified 2120  Assess: SIRS CRITERIA  SIRS Temperature  0  SIRS Respirations  1  SIRS Pulse 1  SIRS WBC 1  SIRS Score Sum  3

## 2023-04-19 NOTE — Plan of Care (Signed)
  Problem: Consults Goal: RH GENERAL PATIENT EDUCATION Description: See Patient Education module for education specifics. Outcome: Progressing Goal: Skin Care Protocol Initiated - if Braden Score 18 or less Description: If consults are not indicated, leave blank or document N/A Outcome: Progressing Goal: Nutrition Consult-if indicated Outcome: Progressing Goal: Diabetes Guidelines if Diabetic/Glucose > 140 Description: If diabetic or lab glucose is > 140 mg/dl - Initiate Diabetes/Hyperglycemia Guidelines & Document Interventions  Outcome: Progressing   Problem: RH BOWEL ELIMINATION Goal: RH STG MANAGE BOWEL WITH ASSISTANCE Description: STG Manage Bowel with mod I Assistance. Outcome: Progressing   Problem: RH SKIN INTEGRITY Goal: RH STG SKIN FREE OF INFECTION/BREAKDOWN Description: Manage w min assist Outcome: Progressing   Problem: RH SAFETY Goal: RH STG ADHERE TO SAFETY PRECAUTIONS W/ASSISTANCE/DEVICE Description: STG Adhere to Safety Precautions With cues Assistance/Device. Outcome: Progressing   Problem: RH PAIN MANAGEMENT Goal: RH STG PAIN MANAGED AT OR BELOW PT'S PAIN GOAL Description: < 4 with prns Outcome: Progressing

## 2023-04-19 NOTE — Progress Notes (Addendum)
Physical Therapy Session Note  Patient Details  Name: [name] [name] MRN: [id number] Date of Birth: 06-16-1945  Today's Date: 04/19/2023 PT Missed Time: 45 Minutes Missed Time Reason: MD hold (Comment) (change in status with tachycardia, SOB, and + homan's sign)  Pt currently on medical hold per charge RN due to change in status with tachycardia, SOB, and + Homan's sign. Will reattempt as medically appropriate.  Edwin Cap PT, DPT 04/19/2023, 2:10 PM

## 2023-04-19 NOTE — Significant Event (Signed)
Rapid Response Event Note   Reason for Call : RN called for SOB and chest pain with inspiration. Chest xray done earlier today that showed small bilateral effusions with vascular congestion. 20mg  Lasix given at 14:25 and 20:49. MD notified.    Initial Focused Assessment: Patient alert and oriented laying in the bed with no signs of distress. Lungs clear, diminished in the bases, on 3L nasal cannula. Patient has voided 175 since last dose of lasix given.       Interventions: EKG obtained.    Plan of Care: Continue to monitor, allow lasix to work. Call back with increase in oxygen need, increased worked of breathing or if further assistance needed.    Event Summary:   MD Notified: PTA Call Time: 21:18 Arrival Time: 21:22 End Time: 21:30  Nechama Guard, RN

## 2023-04-19 NOTE — Progress Notes (Signed)
Occupational Therapy Session Note  Patient Details  Name: [name] [name] MRN: [id number] Date of Birth: August 19, 1945  Today's Date: 04/19/2023 OT Missed Time: 45 Minutes Missed Time Reason: Patient on bedrest  Pt currently on therapy hold from MD. 45 min missed. Will f/u when medically stable.   [name] 04/19/2023, 6:11 AM

## 2023-04-19 NOTE — Progress Notes (Signed)
   04/19/23 1013  Assess: MEWS Score  Temp 97.6 F (36.4 C)  BP 119/83  MAP (mmHg) 94  Pulse Rate (!) 114  Resp 20  SpO2 96 %  O2 Device Nasal Cannula  O2 Flow Rate (L/min) 2 L/min  Assess: MEWS Score  MEWS Temp 0  MEWS Systolic 0  MEWS Pulse 2  MEWS RR 0  MEWS LOC 0  MEWS Score 2  MEWS Score Color Yellow  Assess: if the MEWS score is Yellow or Red  Were vital signs accurate and taken at a resting state? Yes  Does the patient meet 2 or more of the SIRS criteria? No  Does the patient have a confirmed or suspected source of infection? No  MEWS guidelines implemented  Yes, yellow  Treat  MEWS Interventions Considered administering scheduled or prn medications/treatments as ordered  Take Vital Signs  Increase Vital Sign Frequency  Yellow: Q2hr x1, continue Q4hrs until patient remains green for 12hrs  Escalate  MEWS: Escalate Yellow: Discuss with charge nurse and consider notifying provider and/or RRT  Notify: Charge Nurse/RN  Name of Charge Nurse/RN Notified Lottie Sigman, RN  Provider Notification  Provider Name/Title Wendi Maya, [place] and Elijah Birk, DO  Date Provider Notified 04/19/23  Time Provider Notified 1013  Method of Notification Call  Notification Reason Change in status  Provider response At bedside  Date of Provider Response 04/19/23  Time of Provider Response 1020  Assess: SIRS CRITERIA  SIRS Temperature  0  SIRS Respirations  0  SIRS Pulse 1  SIRS WBC 0  SIRS Score Sum  1

## 2023-04-19 NOTE — Progress Notes (Signed)
Chest x-ray resulted:  Narrative & Impression  CLINICAL DATA:  Shortness of breath for a day   EXAM: PORTABLE CHEST 1 VIEW   COMPARISON:  X-ray 04/09/2023.  Older exams as well.   FINDINGS: Sternal wires. Enlarged cardiopericardial silhouette with a calcified aorta. Bilateral upper chest battery packs with leads along the right side of the heart. Please correlate with history. Increasing small bilateral pleural effusions with some adjacent opacity. Mild interstitial prominence. No pneumothorax.   IMPRESSION: Postop chest with enlarged heart. 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   Increasing small bilateral pleural effusions with some vascular congestion and trace interstitial prominence. Recommend follow up     Electronically Signed   By: Karen Kays M.D.   On: 04/19/2023 12:21   Per Dr. Shearon Stalls, will give Lasix 20 mg now.

## 2023-04-19 NOTE — Progress Notes (Signed)
Physical Therapy Session Note  Patient Details  Name: [name] [name] MRN: [id number] Date of Birth: 29-Apr-1946  Today's Date: 04/19/2023 PT Individual Time: 0803-0900 PT Individual Time Calculation (min): 57 min   Short Term Goals: Week 2:  PT Short Term Goal 1 (Week 2): Pt will consistently complete sit<>stands with CGA PT Short Term Goal 2 (Week 2): Pt will ambulate 76' with min assist with LRAD PT Short Term Goal 3 (Week 2): Pt will complete bed mobility with CGA consistently PT Short Term Goal 4 (Week 2): Pt will complete up/down 1 step with min assist  Skilled Therapeutic Interventions/Progress Updates:    Pt presents in room in bed, with 2L supplemental O2 donned, pt reporting feeling SOB and "strangled" sensation with breathing. Therapist notifies RN who presents at beginning of session, pt demonstrating elevated HR ~120 bpm at rest in supine, SpO2 95-97%, BP 149/74. Pt readjusted in supine with HOB elevated and pillows adjusted to allow chest expansion with pt reporting decrease in SOB. Pt motivated to transfer OOB. Therapist dons figure 8 ace wraps to BLEs in supine for BP mgmt. Pt completes sit to supine with min assist to EOB for trunk to upright, increased time required and SOB noted. Pt completes stand step transfer with RW min assist to WC. Vitals reassessed to be BP 125/64. HR 62, SpO2 92% on room air, pt denies SOB. Session then focused on tolerance to upright and education on managing SOB. Therapist uses therapeutic use of environment with pt requesting to go outside briefly during session. Therapist takes pt outside dependently for energy conservation, using travel time to educate pt on use of inspirometer and what SOB could indicate medically. Pt returned to room and completes transfer back to bed with min assist for stand step transfer, mod assist for sit to supine. Vitals reassessed with pt demonstrating BP 133/73, HR 123 in supine. Pt repositioned in supine with all needs  within reach, call light in place at end of session.  Therapy Documentation Precautions:  Precautions Precautions: Fall Precaution Comments: watch BP and HR Restrictions Weight Bearing Restrictions Per Provider Order: No RUE Weight Bearing Per Provider Order: Weight bearing as tolerated   Therapy/Group: Individual Therapy  Edwin Cap PT, DPT 04/19/2023, 10:02 AM

## 2023-04-19 NOTE — Progress Notes (Signed)
PROGRESS NOTE   Subjective/Complaints:  Notified this a.m. of tachycardia 110s to 120s during initial therapies in a.m., patient returned to bed without symptomatic improvement.  Endorsing some shortness of breath, placed on 2 L nasal cannula, satting in the mid 90s.  Denies chest pain, cough, nausea, fevers, or other changes.  Therapy holds place for medical workup.  Later, came back around and saw patient approximately 30 minutes after receiving as needed Lasix for chest x-ray findings of bilateral pleural effusions..  He he stated that he was breathing little bit better, remained on 2 L oxygen, tachycardia now 106.    Pain in R hip resolved today per patient  ROS: Right lower extremity lateral thigh burning pain,-resolved;  aching-resolved +vertigo/dizziness/orthostasis-improving, +insomnia-improved with medication, + tachycardia-ongoing, worse today  denies fevers, chills, N/V, abdominal pain, constipation, diarrhea, SOB, cough, chest pain, new weakness or paraesthesias.    Objective:   No results found.  Recent Labs    04/18/23 0554  WBC 13.1*  HGB 10.0*  HCT 30.1*  PLT 662*   Recent Labs    04/18/23 0554 04/19/23 0512  NA 130* 131*  K 3.6 4.0  CL 93* 94*  CO2 27 28  GLUCOSE 122* 111*  BUN 37* 36*  CREATININE 2.52* 2.30*  CALCIUM 7.7* 8.0*    Intake/Output Summary (Last 24 hours) at 04/19/2023 1018 Last data filed at 04/19/2023 9562 Gross per 24 hour  Intake 358 ml  Output --  Net 358 ml      Pressure Injury 04/08/23 Ankle Posterior;Right Stage 2 -  Partial thickness loss of dermis presenting as a shallow open injury with a red, pink wound bed without slough. unattached edges scant drainage. This LDA replaces one from 03/03/23 that was entered (Active)  04/08/23 1730  Location: Ankle  Location Orientation: Posterior;Right  Staging: Stage 2 -  Partial thickness loss of dermis presenting as a shallow open  injury with a red, pink wound bed without slough.  Wound Description (Comments): unattached edges scant drainage. This LDA replaces one from 03/03/23 that was entered inaccurately as a blister that had opened.  Present on Admission: Yes     Pressure Injury 04/14/23 Heel Left;Lateral Deep Tissue Pressure Injury - Purple or maroon localized area of discolored intact skin or blood-filled blister due to damage of underlying soft tissue from pressure and/or shear. (Active)  04/14/23 1010  Location: Heel  Location Orientation: Left;Lateral  Staging: Deep Tissue Pressure Injury - Purple or maroon localized area of discolored intact skin or blood-filled blister due to damage of underlying soft tissue from pressure and/or shear.  Wound Description (Comments):   Present on Admission: No    Physical Exam: Vital Signs Blood pressure 119/83, pulse (!) 114, temperature 97.6 F (36.4 C), temperature source Oral, resp. rate 18, height 6\' 2"  (1.88 m), weight 75 kg, SpO2 96%.   PE: Constitution: Appropriate appearance for age. No apparent distress.  Sitting up in bed. HEENT: PERRLA, EOMI.  Glasses donned. Resp: No respiratory distress. No accessory muscle usage.  On room air, satting 96% on 2 L nasal cannula. Cardio: Well perfused appearance.  2-3+ pitting edema at the right thigh, 1+ in bilateral lower  extremities. + Pain on right calf squeeze.  Capillary refill brisk in all 4 extremities. Abdomen: Nondistended. Nontender.  Nondistended.  Positive bowel sounds, normoactive. Psych: Appropriate mood and affect.  Moderately anxious today. Skin:  - Mepilex dressing to right heel, sacral heart to low back for DTI's, unstageable-not examined  Neurologic Exam:   Awake, alert, and oriented x 3.  No apparent cognitive deficits. Sensory exam: revealed normal sensation in all dermatomal regions in bilateral upper and lower extremities Motor exam: Antigravity and against resistance in all 4 extremities; no  appreciable changes from prior exams.  4 out of 5 throughout. coordination: Fine motor coordination was normal.             Assessment/Plan: 1. Functional deficits which require 3+ hours per day of interdisciplinary therapy in a comprehensive inpatient rehab setting. Physiatrist is providing close team supervision and 24 hour management of active medical problems listed below. Physiatrist and rehab team continue to assess barriers to discharge/monitor patient progress toward functional and medical goals  Care Tool:  Bathing    Body parts bathed by patient: Right arm, Left arm, Chest, Face, Abdomen   Body parts bathed by helper: Front perineal area, Right lower leg, Left upper leg, Right upper leg, Buttocks, Left lower leg     Bathing assist Assist Level: Total Assistance - Patient < 25%     Upper Body Dressing/Undressing Upper body dressing   What is the patient wearing?: Pull over shirt    Upper body assist Assist Level: Contact Guard/Touching assist    Lower Body Dressing/Undressing Lower body dressing      What is the patient wearing?: Pants     Lower body assist Assist for lower body dressing: Total Assistance - Patient < 25%     Toileting Toileting    Toileting assist Assist for toileting: Maximal Assistance - Patient 25 - 49%     Transfers Chair/bed transfer  Transfers assist  Chair/bed transfer activity did not occur: Safety/medical concerns (unsafe to get up)  Chair/bed transfer assist level: Minimal Assistance - Patient > 75%     Locomotion Ambulation   Ambulation assist   Ambulation activity did not occur: Safety/medical concerns (amb 8' in //bars with min assist and +2 for WC follow)  Assist level: 2 helpers Assistive device: Walker-rolling Max distance: 33'   Walk 10 feet activity   Assist  Walk 10 feet activity did not occur: Safety/medical concerns  Assist level: 2 helpers Assistive device: Walker-rolling   Walk 50 feet  activity   Assist Walk 50 feet with 2 turns activity did not occur: Safety/medical concerns         Walk 150 feet activity   Assist Walk 150 feet activity did not occur: Safety/medical concerns         Walk 10 feet on uneven surface  activity   Assist Walk 10 feet on uneven surfaces activity did not occur: Safety/medical concerns         Wheelchair     Assist Is the patient using a wheelchair?: Yes Type of Wheelchair: Manual    Wheelchair assist level: Contact Guard/Touching assist Max wheelchair distance: 31'    Wheelchair 50 feet with 2 turns activity    Assist        Assist Level: Contact Guard/Touching assist   Wheelchair 150 feet activity     Assist      Assist Level: Moderate Assistance - Patient 50 - 74%   Blood pressure 119/83, pulse (!) 114, temperature  97.6 F (36.4 C), temperature source Oral, resp. rate 18, height 6\' 2"  (1.88 m), weight 75 kg, SpO2 96%.  Medical Problem List and Plan: 1. Functional deficits secondary to retroperitoneal hematoma after recent fall.             -also right right AC jt injury, ?clavicle fx             -patient may  shower  -Stable to continue CIR             -ELOS/Goals: 10-12 days, supervision goals with PT, OT -  DC date goal extended to 12/30   - 12/10: Family installing chair lift. Has HH in place. Functionally limited by RLE pain.  - 12/17: Limited by orthostasis per therapies, and decreased insight. CGA LBD. Hasn't made much progress with PT. Walker 22 feet Min A with RW yesterday. RLE pain limiting but improving. Self-limiting.  CGA ambulatory goals. May need WC at discharge.  12/20: On medical hold for symptomatic tachycardia today.  Improved this afternoon, will DC hold for tomorrow.   2.  Antithrombotics: -DVT/anticoagulation:  Pharmaceutical: Eliquis 5mg  BID             -antiplatelet therapy: none             -pt with new right lower extremity edema and thigh pain. Dopplers pending                          -eliquis recently resumed, so findings won't change medical mgt 3. Pain Management: Tylenol, oxycodone as needed for hip  -right thigh pain. ?muscle strain. No sensory findings. Most recent CT reviewed.  It's possible he's had further migration of blood further down into the thigh as there was blood along the distal iliopsoas on that CT                         -add prn robaxin                         -order kpad                          -dopplers pending as above - 12/10: Per OT Max 2 OOB due to leg pain.  On exam,?  Meralgia paresthetica based on distribution, will add gabapentin 100 mg 3 times daily due to renal deficiency.  Increase oxycodone to 5 to 10 mg as needed.  Not scheduled Tylenol due to elevated LFTs. -12/12: Episode of dizziness a.m., had taken a.m. oxycodone several hours prior.  Decrease as needed to 5 mg every 6 hours as needed.  DC gabapentin. - 12-13: Dizziness not recurrent.  Start Robaxin 750 mg 3 times daily for ongoing thigh pain. - 12-16: LFTs essentially normalized.  Order standing Tylenol 1000 mg 3 times daily 12-17: Pain improving.  Patient wants just to stay on current regimen for 1 to 2 days prior to down titrating. 12/19: Reduce scheduled Robaxin to 500 mg 3 times daily.  Reduce as needed oxycodone to 2.5 mg for severe pain. 12-20: Move Robaxin to as needed  4. Mood/Behavior/Sleep: LCSW to evaluate and provide emotional support             -continue melatonin 10mg  (ordered as 5mg  PRN though?)             -antipsychotic agents: n/a -04/14/23 pt slept poorly,  takes 5-10mg  ambien at home; d/t age, will start 5mg  dose PRN, advised he has to ask for it; melatonin PRN still in place, for if ambien doesn't help.  12-16: Used both PRNs yesterday.  Still having panic attacks and severe anxiety per nursing.  Order standing BuSpar 5 mg 3 times daily.  Would benefit from neuropsych consult 12-17: Monitor BuSpar 1 to 2 days before increasing. 12-18, remains  anxious, but without panic attacks.  Does not appear to be self-limiting during his transfers, simply deconditioned.  Monitor on current medications.   5. Neuropsych/cognition: This patient is capable of making decisions on his own behalf. - 12/10: SLP consult for reported cognitive deficits; may be medication induced per wife 12-18: Per teams, cognitive baseline.  6. Skin/Wound Care: Routine skin care checks   - Stage 2 to sacrum reported-now unstageable 12-11: Unstageable found on right heel.  Added Medihoney to heel and low back, changed to low-air-loss mattress - WOCN consulted 12/11 for recs   - Lateral trunk rash managed with steroid creams essentially resolved   -12-13: Left antecubital IV infiltration.  Ice and compression to area.  Resolved  7. Fluids/Electrolytes/Nutrition/hyponatremia: Strict Is and Os and follow-up chemistries   - 12/10: Fluid restriction for mild hyponatremia today.  Repeat tomorrow a.m. - 12/11: Na 129 today; was not compliant with fluid restriction yesterday. Continue today and repeat in AM tomorrow; if no improvement will need to start gentle IVF. Add 40 meq daily potassium.  12-12: NA 131, improved.  Potassium normalized.  DC supplementation of potassium given all priors were normal on same dose of torsemide.  - 04/13/23: NA 130, BUN/creatinine stable, same as yesterday.  Monitor - 12-19: NA 130, BUN/creatinine slightly increased.  Start Os-Cal 1 tab twice daily for hypocalcemia less than 8.  Liberalizing fluids per patient request; repeat BMP in AM with magnesium, Phos, and vitamin D. 12/20: NA 131 on liberalized fluids.  Phosphorus mildly high 5.0, otherwise labs appear stable.  Vitamin D is low/normal, calcium improving on supplementation.  8: Hypertension: monitor TID and prn (Entresto, torsemide held)>> still on torsemide 20mg  daily             -currently on midodrine 10 mg TID for hypotension -12-12: Slightly hypotensive, on fluid restriction with  diuresis, would continue midodrine for now.  Questionable orthostatic episode today, symptoms more vertiginous, treatment as below.  12-13: BP improved, no further symptoms orthostasis, monitor  -12/14-15/24 BPs great, monitor  12-16: Decrease midodrine to 5 mg twice daily.  Wean gradually over the next few days if tolerated.  Has TED hose.  12/17: increase midodrine back to 7.5 twice daily due to symptomatic orthostasis with therapies; question more due to deconditioning due to tendency to happen at end of ambulation.  12/18: Normotensive.   12/19: Blood pressure stable, but tachycardic with orthostatic vitals into the 120s.  12/20: Blood pressure remaining stable, primary issue is tachycardia.  Reduce midodrine to 2.5 mg 3 times daily, could DC over this weekend if BP holds   Vitals:   04/18/23 1310 04/18/23 1325 04/18/23 1512 04/18/23 1813  BP: 111/68 110/77 129/72 117/66   04/18/23 2025 04/19/23 0003 04/19/23 0402 04/19/23 0817  BP: 135/71 129/81 132/73 (!) 149/74   04/19/23 0848 04/19/23 0900 04/19/23 1011 04/19/23 1013  BP: 125/64 (P) 133/73 119/83 119/83     9:  Cardiorenal syndrome/acute kidney Injury on chronic kidney disease stage IIIb (baseline creatinine around 2)              -  milrinone discontinued 12/08             -no indication for HD             -daily weight             -continue sodium bicarbonate 650 mg BID             -continue torsemide 20 mg daily  12/10-14: Renal function appears stable.   -04/14/23 Cr up to 2.25 today, baseline is 2 so will just encourage PO intake for now, but monitor closely 12-16: Creatinine down to 2.0.  Back to baseline. 12/19: Creatinine back up to 2.5, liberalizing fluids as above and repeat in a.m. 12/20: Creatinine improved.  NA stable and liberalize fluids.    10: Fall/right retroperitoneal hematoma: now back on Eliquis; H and H stable             -follow-up CBC - stable   11: Atypical atrial flutter; hx of pAF: rate controlled  (low 100s)             -on Eliquis 5mg  BID, amiodarone 200mg  daily  -Heart rate stable, does have some variation into the low 100s to 120s intermittently.  12-20: Tachycardia today 110-120s, chest x-ray with increased fluid.  Bilateral lower extremity duplex is negative.  EKG appears unchanged from prior.  If no improvement with treatment of volume overload as above, will get cardiology involved over this weekend for medication adjustments.   12: COPD: former tobacco smoker, quit 2018   13: DM-2: CBGs QID; A1c = 6.9% (home on Ozempic; metformin discontinued due to CKD)             -continue SSI  Blood sugars have remained relatively stable -04/13/23 CBGs variable, many >200, would probably benefit from some optimization, but defer to weekday team to discuss options -04/14/23 CBGs a bit better but still might benefit from further optimization 12-17: BG is well-controlled.  Monitor. Recent Labs    04/18/23 1622 04/18/23 2028 04/19/23 0603  GLUCAP 136* 175* 104*       14: CAD: s/p 2-vessel CABG 2018             -restart statin once LFTs normalized             -follow-up CMP Mon 12/16  12-17: LFTs improved, resume prior Lipitor 80 mg daily  Repeat CMP 12-19-LFTs continuing to normalize   15: Acute on chronic systolic and diastolic heart failure, 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  I's and O's remain significantly positive.  Added TED hose for bilateral lower extremity edema; weights remain stable, no symptoms of volume overload. 12/12-17: Weights stable.  Can't tolerate TEDs, advised we could ACE wrap his RLE to help with the swelling; pt will consider it - done 12/17 12-18: Weight significantly down, likely an error, edema around thigh appears approximately the same.  CMP tomorrow a.m. 12-19: No weights overnight.  Clinically volume  stable. 12/20: Weight stable.  New shortness of breath, peripheral edema, and chest x-ray with increased bilateral pleural effusions.  Giving Lasix 20 mg as needed x 1. Filed Weights   04/15/23 0629 04/17/23 0608 04/19/23 1023  Weight: 89.6 kg 75 kg 72.1 kg  16: Elevated transaminases, up trending: ? due to congestive hepatopathy             -follow-up CMP-downtrending, but persistent, f/up Monday   17: Multiple falls; AC joint injury/possible right distal clavicular fracture 11/05   18: Possible pneumonia: continue Augmentin to finish the course   - 12/10:  Repeat chest x-ray without consolidation  - 12/11: Augmentin stopped per ID recommendations  19: Leukocytosis, chronic: negative infectious work-up except possible PNA; had short course steroids for skin rash             -appears to be off prednisone now             -on Augmentin (end 12/11)             -follow-up CBC with diff on 12/10 12/10: Uptrending leukocytosis 28.3 -> 37.8 -> 43.7 despite stopping p.o. steroids on 12-7.  Extensive inpatient workup for leukocytosis described in H&P.  Does have chronic underlying leukocytosis in the 20s.  Will consult infectious disease for additional workup/ideas today, feel less likely reactive given that thrombocytosis is improving and other parameters remained stable.  12/11: Mildly decreased to 35 today. Remains afebrile. ID consulted yesterday, recommending DC augmentin, daily monitorring, and drainage of psoas fluid collection if no improvement. Appreciate their insight and recommendations. 12-12: WBC back up to 40, thrombocytosis mildly uptrending.  Afebrile, per infectious disease aspiration of right hip fluid performed and cultures pending.  Appreciate their recommendations and management. 12-13: 300 cc hip aspirate with multiple WBCs and PMNs.  Patient started on vancomycin per infectious disease.  Monitor for final culture results.--Lactobacillus contaminant 12-16: Culture grew  contaminant as above.  WBC is downtrending.  Stop vancomycin, repeat CBC every 72 hours and reconsult ID as needed.  Very much appreciate their assistance in the management of this patient. Repeat CBC 12-19-WBC continues to downtrend.  Stable 12/20  20: Lung nodule: follow-up outpatient with PCP   21: Hx of bladder cancer s/p TURP 2013   22: Skin rash treated with prednisone/hydroxyzine/calamine lotion             -appears prednisone has been discontinued-rash essentially resolved.   23: Chronic anemia: H and H stable times 72 hours             -follow-up CBC-stable 12/14   24: Thrombocytosis; fluctuating: follow-up CBC.  Ongoing..   25. Vertigo/dizziness.  Started 12-12, no nystagmus on exam. -orthostatic vitals every morning -Added as needed meclizine 12.5 mg twice daily-discontinued due to no use 12-18 -Possible symptoms are secondary to medication burden, would wean down pain medications as tolerated -12-13: Improved, no recurrent symptoms endorsed 12-17: Increasing midodrine as above.  Seems more due to deconditioning, monitor. 12-19: Patient reports only in a.m., seems more related to tachycardia than blood pressure on orthostatic vitals.  Liberalizing fluids as above, keep torsemide at same dose for now.  If no improvement in symptoms, will need cardiology consult to help with medication adjustment 12-20: See above.  Some signs of volume overload today with tachycardia.  26.  Constipation.  Increase sorbitol to 2 tabs twice daily, add daily MiraLAX. -Last bowel movement 12-18 with sorbitol   LOS: 11 days A FACE TO FACE EVALUATION WAS PERFORMED  Angelina Sheriff 04/19/2023, 10:18 AM

## 2023-04-20 ENCOUNTER — Telehealth: Payer: Self-pay | Admitting: Internal Medicine

## 2023-04-20 ENCOUNTER — Inpatient Hospital Stay (HOSPITAL_COMMUNITY)
Admission: RE | Admit: 2023-04-20 | Discharge: 2023-05-01 | DRG: 286 | Disposition: E | Payer: Medicare Other | Source: Ambulatory Visit | Attending: Internal Medicine | Admitting: Internal Medicine

## 2023-04-20 ENCOUNTER — Encounter (HOSPITAL_COMMUNITY): Payer: Self-pay

## 2023-04-20 ENCOUNTER — Other Ambulatory Visit: Payer: Self-pay

## 2023-04-20 ENCOUNTER — Encounter (HOSPITAL_COMMUNITY): Payer: Self-pay | Admitting: Internal Medicine

## 2023-04-20 DIAGNOSIS — I5043 Acute on chronic combined systolic (congestive) and diastolic (congestive) heart failure: Secondary | ICD-10-CM | POA: Diagnosis not present

## 2023-04-20 DIAGNOSIS — I951 Orthostatic hypotension: Secondary | ICD-10-CM | POA: Diagnosis present

## 2023-04-20 DIAGNOSIS — R570 Cardiogenic shock: Secondary | ICD-10-CM | POA: Diagnosis not present

## 2023-04-20 DIAGNOSIS — Z95810 Presence of automatic (implantable) cardiac defibrillator: Secondary | ICD-10-CM

## 2023-04-20 DIAGNOSIS — I252 Old myocardial infarction: Secondary | ICD-10-CM

## 2023-04-20 DIAGNOSIS — Z9049 Acquired absence of other specified parts of digestive tract: Secondary | ICD-10-CM

## 2023-04-20 DIAGNOSIS — N184 Chronic kidney disease, stage 4 (severe): Secondary | ICD-10-CM | POA: Diagnosis present

## 2023-04-20 DIAGNOSIS — E1151 Type 2 diabetes mellitus with diabetic peripheral angiopathy without gangrene: Secondary | ICD-10-CM | POA: Diagnosis present

## 2023-04-20 DIAGNOSIS — Z66 Do not resuscitate: Secondary | ICD-10-CM | POA: Diagnosis not present

## 2023-04-20 DIAGNOSIS — E43 Unspecified severe protein-calorie malnutrition: Secondary | ICD-10-CM | POA: Diagnosis present

## 2023-04-20 DIAGNOSIS — Z951 Presence of aortocoronary bypass graft: Secondary | ICD-10-CM

## 2023-04-20 DIAGNOSIS — I5020 Unspecified systolic (congestive) heart failure: Secondary | ICD-10-CM | POA: Diagnosis not present

## 2023-04-20 DIAGNOSIS — Z79899 Other long term (current) drug therapy: Secondary | ICD-10-CM

## 2023-04-20 DIAGNOSIS — Z515 Encounter for palliative care: Secondary | ICD-10-CM

## 2023-04-20 DIAGNOSIS — J189 Pneumonia, unspecified organism: Secondary | ICD-10-CM | POA: Diagnosis not present

## 2023-04-20 DIAGNOSIS — E8809 Other disorders of plasma-protein metabolism, not elsewhere classified: Secondary | ICD-10-CM | POA: Diagnosis present

## 2023-04-20 DIAGNOSIS — I5084 End stage heart failure: Secondary | ICD-10-CM | POA: Diagnosis present

## 2023-04-20 DIAGNOSIS — D509 Iron deficiency anemia, unspecified: Secondary | ICD-10-CM | POA: Diagnosis present

## 2023-04-20 DIAGNOSIS — D75839 Thrombocytosis, unspecified: Secondary | ICD-10-CM | POA: Diagnosis present

## 2023-04-20 DIAGNOSIS — E1122 Type 2 diabetes mellitus with diabetic chronic kidney disease: Secondary | ICD-10-CM | POA: Diagnosis present

## 2023-04-20 DIAGNOSIS — D631 Anemia in chronic kidney disease: Secondary | ICD-10-CM | POA: Diagnosis present

## 2023-04-20 DIAGNOSIS — J9601 Acute respiratory failure with hypoxia: Secondary | ICD-10-CM | POA: Diagnosis present

## 2023-04-20 DIAGNOSIS — I480 Paroxysmal atrial fibrillation: Secondary | ICD-10-CM | POA: Diagnosis present

## 2023-04-20 DIAGNOSIS — E663 Overweight: Secondary | ICD-10-CM | POA: Diagnosis present

## 2023-04-20 DIAGNOSIS — J440 Chronic obstructive pulmonary disease with acute lower respiratory infection: Secondary | ICD-10-CM | POA: Diagnosis not present

## 2023-04-20 DIAGNOSIS — Z6825 Body mass index (BMI) 25.0-25.9, adult: Secondary | ICD-10-CM

## 2023-04-20 DIAGNOSIS — I130 Hypertensive heart and chronic kidney disease with heart failure and stage 1 through stage 4 chronic kidney disease, or unspecified chronic kidney disease: Principal | ICD-10-CM | POA: Diagnosis present

## 2023-04-20 DIAGNOSIS — Z881 Allergy status to other antibiotic agents status: Secondary | ICD-10-CM

## 2023-04-20 DIAGNOSIS — I5023 Acute on chronic systolic (congestive) heart failure: Secondary | ICD-10-CM | POA: Diagnosis present

## 2023-04-20 DIAGNOSIS — Z8249 Family history of ischemic heart disease and other diseases of the circulatory system: Secondary | ICD-10-CM

## 2023-04-20 DIAGNOSIS — N179 Acute kidney failure, unspecified: Secondary | ICD-10-CM | POA: Diagnosis present

## 2023-04-20 DIAGNOSIS — I110 Hypertensive heart disease with heart failure: Secondary | ICD-10-CM | POA: Diagnosis not present

## 2023-04-20 DIAGNOSIS — R34 Anuria and oliguria: Secondary | ICD-10-CM | POA: Diagnosis not present

## 2023-04-20 DIAGNOSIS — I2510 Atherosclerotic heart disease of native coronary artery without angina pectoris: Secondary | ICD-10-CM | POA: Diagnosis not present

## 2023-04-20 DIAGNOSIS — I4891 Unspecified atrial fibrillation: Secondary | ICD-10-CM

## 2023-04-20 DIAGNOSIS — Z7901 Long term (current) use of anticoagulants: Secondary | ICD-10-CM

## 2023-04-20 DIAGNOSIS — Z8551 Personal history of malignant neoplasm of bladder: Secondary | ICD-10-CM

## 2023-04-20 DIAGNOSIS — I4892 Unspecified atrial flutter: Secondary | ICD-10-CM | POA: Diagnosis present

## 2023-04-20 DIAGNOSIS — M7989 Other specified soft tissue disorders: Secondary | ICD-10-CM | POA: Diagnosis not present

## 2023-04-20 DIAGNOSIS — I255 Ischemic cardiomyopathy: Secondary | ICD-10-CM | POA: Diagnosis present

## 2023-04-20 DIAGNOSIS — I483 Typical atrial flutter: Secondary | ICD-10-CM | POA: Diagnosis not present

## 2023-04-20 DIAGNOSIS — L89150 Pressure ulcer of sacral region, unstageable: Secondary | ICD-10-CM | POA: Diagnosis not present

## 2023-04-20 DIAGNOSIS — I509 Heart failure, unspecified: Secondary | ICD-10-CM | POA: Diagnosis not present

## 2023-04-20 DIAGNOSIS — E871 Hypo-osmolality and hyponatremia: Secondary | ICD-10-CM | POA: Diagnosis present

## 2023-04-20 DIAGNOSIS — K683 Retroperitoneal hematoma: Secondary | ICD-10-CM | POA: Diagnosis not present

## 2023-04-20 DIAGNOSIS — I5082 Biventricular heart failure: Secondary | ICD-10-CM | POA: Diagnosis present

## 2023-04-20 DIAGNOSIS — Z87891 Personal history of nicotine dependence: Secondary | ICD-10-CM

## 2023-04-20 DIAGNOSIS — R54 Age-related physical debility: Secondary | ICD-10-CM | POA: Diagnosis present

## 2023-04-20 DIAGNOSIS — R5381 Other malaise: Secondary | ICD-10-CM | POA: Diagnosis not present

## 2023-04-20 DIAGNOSIS — Z888 Allergy status to other drugs, medicaments and biological substances status: Secondary | ICD-10-CM

## 2023-04-20 DIAGNOSIS — Z7189 Other specified counseling: Secondary | ICD-10-CM | POA: Diagnosis not present

## 2023-04-20 LAB — CBC
HCT: 32.3 % — ABNORMAL LOW (ref 39.0–52.0)
Hemoglobin: 10.5 g/dL — ABNORMAL LOW (ref 13.0–17.0)
MCH: 26.3 pg (ref 26.0–34.0)
MCHC: 32.5 g/dL (ref 30.0–36.0)
MCV: 81 fL (ref 80.0–100.0)
Platelets: 662 10*3/uL — ABNORMAL HIGH (ref 150–400)
RBC: 3.99 MIL/uL — ABNORMAL LOW (ref 4.22–5.81)
RDW: 20.4 % — ABNORMAL HIGH (ref 11.5–15.5)
WBC: 16 10*3/uL — ABNORMAL HIGH (ref 4.0–10.5)
nRBC: 0 % (ref 0.0–0.2)

## 2023-04-20 LAB — COMPREHENSIVE METABOLIC PANEL
ALT: 45 U/L — ABNORMAL HIGH (ref 0–44)
AST: 70 U/L — ABNORMAL HIGH (ref 15–41)
Albumin: 2 g/dL — ABNORMAL LOW (ref 3.5–5.0)
Alkaline Phosphatase: 156 U/L — ABNORMAL HIGH (ref 38–126)
Anion gap: 13 (ref 5–15)
BUN: 42 mg/dL — ABNORMAL HIGH (ref 8–23)
CO2: 26 mmol/L (ref 22–32)
Calcium: 8.2 mg/dL — ABNORMAL LOW (ref 8.9–10.3)
Chloride: 90 mmol/L — ABNORMAL LOW (ref 98–111)
Creatinine, Ser: 2.46 mg/dL — ABNORMAL HIGH (ref 0.61–1.24)
GFR, Estimated: 26 mL/min — ABNORMAL LOW (ref >60)
Glucose, Bld: 166 mg/dL — ABNORMAL HIGH (ref 70–99)
Potassium: 4.3 mmol/L (ref 3.5–5.1)
Sodium: 129 mmol/L — ABNORMAL LOW (ref 135–145)
Total Bilirubin: 1.3 mg/dL — ABNORMAL HIGH (ref <1.2)
Total Protein: 5.7 g/dL — ABNORMAL LOW (ref 6.5–8.1)

## 2023-04-20 LAB — BASIC METABOLIC PANEL
Anion gap: 13 (ref 5–15)
BUN: 44 mg/dL — ABNORMAL HIGH (ref 8–23)
CO2: 25 mmol/L (ref 22–32)
Calcium: 8 mg/dL — ABNORMAL LOW (ref 8.9–10.3)
Chloride: 92 mmol/L — ABNORMAL LOW (ref 98–111)
Creatinine, Ser: 2.75 mg/dL — ABNORMAL HIGH (ref 0.61–1.24)
GFR, Estimated: 23 mL/min — ABNORMAL LOW (ref >60)
Glucose, Bld: 173 mg/dL — ABNORMAL HIGH (ref 70–99)
Potassium: 4.2 mmol/L (ref 3.5–5.1)
Sodium: 130 mmol/L — ABNORMAL LOW (ref 135–145)

## 2023-04-20 LAB — BRAIN NATRIURETIC PEPTIDE
B Natriuretic Peptide: 2698.9 pg/mL — ABNORMAL HIGH (ref 0.0–100.0)
B Natriuretic Peptide: 2078.7 pg/mL — ABNORMAL HIGH (ref 0.0–100.0)

## 2023-04-20 LAB — MRSA NEXT GEN BY PCR, NASAL: MRSA by PCR Next Gen: NOT DETECTED

## 2023-04-20 LAB — GLUCOSE, CAPILLARY
Glucose-Capillary: 139 mg/dL — ABNORMAL HIGH (ref 70–99)
Glucose-Capillary: 180 mg/dL — ABNORMAL HIGH (ref 70–99)
Glucose-Capillary: 132 mg/dL — ABNORMAL HIGH (ref 70–99)
Glucose-Capillary: 165 mg/dL — ABNORMAL HIGH (ref 70–99)

## 2023-04-20 LAB — TROPONIN I (HIGH SENSITIVITY): Troponin I (High Sensitivity): 33 ng/L — ABNORMAL HIGH (ref <18)

## 2023-04-20 MED ORDER — ORAL CARE MOUTH RINSE
15.0000 mL | OROMUCOSAL | Status: DC | PRN
  Administered 2023-04-21: 15 mL via OROMUCOSAL

## 2023-04-20 MED ORDER — GERHARDT'S BUTT CREAM
TOPICAL_CREAM | Freq: Two times a day (BID) | CUTANEOUS | Status: DC
  Administered 2023-04-22: 1 via TOPICAL
  Filled 2023-04-20 (×3): qty 60

## 2023-04-20 MED ORDER — ACETAMINOPHEN 500 MG PO TABS
1000.0000 mg | ORAL_TABLET | Freq: Three times a day (TID) | ORAL | Status: DC
Start: 2023-04-20 — End: 2023-04-29
  Administered 2023-04-20 – 2023-04-29 (×20): 1000 mg via ORAL
  Filled 2023-04-20 (×23): qty 2

## 2023-04-20 MED ORDER — AMIODARONE IV BOLUS ONLY 150 MG/100ML
150.0000 mg | Freq: Once | INTRAVENOUS | Status: AC
  Administered 2023-04-20: 150 mg via INTRAVENOUS

## 2023-04-20 MED ORDER — FUROSEMIDE 10 MG/ML IJ SOLN
80.0000 mg | Freq: Once | INTRAMUSCULAR | Status: AC
  Administered 2023-04-20: 80 mg via INTRAVENOUS
  Filled 2023-04-20: qty 8

## 2023-04-20 MED ORDER — CHLORHEXIDINE GLUCONATE CLOTH 2 % EX PADS
6.0000 | MEDICATED_PAD | Freq: Every day | CUTANEOUS | Status: DC
  Administered 2023-04-20 – 2023-04-29 (×9): 6 via TOPICAL

## 2023-04-20 MED ORDER — FUROSEMIDE 10 MG/ML IJ SOLN: 40.0000 mg | Freq: Two times a day (BID) | INTRAMUSCULAR | Status: DC

## 2023-04-20 MED ORDER — AMIODARONE HCL IN DEXTROSE 360-4.14 MG/200ML-% IV SOLN
60.0000 mg/h | INTRAVENOUS | Status: DC
  Filled 2023-04-20: qty 200

## 2023-04-20 MED ORDER — METHOCARBAMOL 500 MG PO TABS
500.0000 mg | ORAL_TABLET | Freq: Three times a day (TID) | ORAL | Status: DC | PRN
  Administered 2023-04-27: 500 mg via ORAL
  Filled 2023-04-20: qty 1

## 2023-04-20 MED ORDER — MILRINONE LACTATE IN DEXTROSE 20-5 MG/100ML-% IV SOLN
0.2500 ug/kg/min | INTRAVENOUS | Status: DC
  Administered 2023-04-20 – 2023-04-21 (×2): 0.25 ug/kg/min via INTRAVENOUS
  Filled 2023-04-20 (×2): qty 100

## 2023-04-20 MED ORDER — SODIUM BICARBONATE 650 MG PO TABS
650.0000 mg | ORAL_TABLET | Freq: Two times a day (BID) | ORAL | Status: DC
  Administered 2023-04-20 – 2023-04-29 (×16): 650 mg via ORAL
  Filled 2023-04-20 (×18): qty 1

## 2023-04-20 MED ORDER — ATORVASTATIN CALCIUM 80 MG PO TABS
80.0000 mg | ORAL_TABLET | Freq: Every day | ORAL | Status: DC
  Administered 2023-04-21 – 2023-04-29 (×8): 80 mg via ORAL
  Filled 2023-04-20 (×10): qty 1

## 2023-04-20 MED ORDER — AMIODARONE HCL IN DEXTROSE 360-4.14 MG/200ML-% IV SOLN
30.0000 mg/h | INTRAVENOUS | Status: DC
  Filled 2023-04-20: qty 200

## 2023-04-20 MED ORDER — AMIODARONE HCL IN DEXTROSE 360-4.14 MG/200ML-% IV SOLN
60.0000 mg/h | INTRAVENOUS | Status: AC
  Administered 2023-04-20: 60 mg/h via INTRAVENOUS

## 2023-04-20 MED ORDER — POLYETHYLENE GLYCOL 3350 17 G PO PACK
17.0000 g | PACK | Freq: Every day | ORAL | Status: DC
  Administered 2023-04-21 – 2023-04-29 (×3): 17 g via ORAL
  Filled 2023-04-20 (×6): qty 1

## 2023-04-20 MED ORDER — FUROSEMIDE 10 MG/ML IJ SOLN
40.0000 mg | Freq: Once | INTRAMUSCULAR | Status: DC
  Filled 2023-04-20: qty 4

## 2023-04-20 MED ORDER — SODIUM CHLORIDE 0.9 % IV SOLN: 250.0000 mL | INTRAVENOUS | Status: DC | PRN

## 2023-04-20 MED ORDER — APIXABAN 5 MG PO TABS
5.0000 mg | ORAL_TABLET | Freq: Two times a day (BID) | ORAL | Status: DC
  Administered 2023-04-20 – 2023-04-29 (×18): 5 mg via ORAL
  Filled 2023-04-20 (×19): qty 1

## 2023-04-20 MED ORDER — FUROSEMIDE 10 MG/ML IJ SOLN
160.0000 mg | Freq: Two times a day (BID) | INTRAVENOUS | Status: DC
  Administered 2023-04-20 – 2023-04-22 (×5): 160 mg via INTRAVENOUS
  Filled 2023-04-20: qty 10
  Filled 2023-04-20: qty 16
  Filled 2023-04-20: qty 10
  Filled 2023-04-20 (×2): qty 16
  Filled 2023-04-20 (×2): qty 10

## 2023-04-20 MED ORDER — FUROSEMIDE 10 MG/ML IJ SOLN: 80.0000 mg | Freq: Two times a day (BID) | INTRAMUSCULAR | Status: DC

## 2023-04-20 MED ORDER — MIDODRINE HCL 5 MG PO TABS
5.0000 mg | ORAL_TABLET | Freq: Three times a day (TID) | ORAL | Status: DC
  Administered 2023-04-20 – 2023-04-24 (×11): 5 mg via ORAL
  Filled 2023-04-20 (×11): qty 1

## 2023-04-20 MED ORDER — INSULIN ASPART 100 UNIT/ML IJ SOLN: 0.0000 [IU] | Freq: Every day | INTRAMUSCULAR | Status: DC

## 2023-04-20 MED ORDER — SENNOSIDES-DOCUSATE SODIUM 8.6-50 MG PO TABS
2.0000 | ORAL_TABLET | Freq: Two times a day (BID) | ORAL | Status: DC
  Administered 2023-04-20 – 2023-04-29 (×12): 2 via ORAL
  Filled 2023-04-20 (×13): qty 2

## 2023-04-20 MED ORDER — SODIUM CHLORIDE 0.9% FLUSH
3.0000 mL | INTRAVENOUS | Status: DC | PRN
Start: 2023-04-20 — End: 2023-04-20

## 2023-04-20 MED ORDER — INSULIN ASPART 100 UNIT/ML IJ SOLN
0.0000 [IU] | Freq: Three times a day (TID) | INTRAMUSCULAR | Status: DC
  Administered 2023-04-20: 3 [IU] via SUBCUTANEOUS
  Administered 2023-04-20: 2 [IU] via SUBCUTANEOUS
  Administered 2023-04-21: 3 [IU] via SUBCUTANEOUS
  Administered 2023-04-21 (×2): 2 [IU] via SUBCUTANEOUS
  Administered 2023-04-22 (×2): 3 [IU] via SUBCUTANEOUS
  Administered 2023-04-22 – 2023-04-24 (×5): 2 [IU] via SUBCUTANEOUS
  Administered 2023-04-24 – 2023-04-25 (×4): 3 [IU] via SUBCUTANEOUS
  Administered 2023-04-25 – 2023-04-27 (×5): 2 [IU] via SUBCUTANEOUS
  Administered 2023-04-27 (×2): 3 [IU] via SUBCUTANEOUS
  Administered 2023-04-28 – 2023-04-29 (×3): 2 [IU] via SUBCUTANEOUS
  Administered 2023-04-29: 3 [IU] via SUBCUTANEOUS

## 2023-04-20 MED ORDER — OYSTER SHELL CALCIUM/D3 500-5 MG-MCG PO TABS
1.0000 | ORAL_TABLET | Freq: Two times a day (BID) | ORAL | Status: DC
  Administered 2023-04-21 – 2023-04-29 (×14): 1 via ORAL
  Filled 2023-04-20 (×20): qty 1

## 2023-04-20 MED ORDER — OXYCODONE HCL 5 MG PO TABS
5.0000 mg | ORAL_TABLET | ORAL | Status: DC | PRN
  Administered 2023-04-20 – 2023-04-29 (×21): 5 mg via ORAL
  Filled 2023-04-20 (×21): qty 1

## 2023-04-20 MED ORDER — ZOLPIDEM TARTRATE 5 MG PO TABS
5.0000 mg | ORAL_TABLET | Freq: Every evening | ORAL | Status: DC | PRN
  Administered 2023-04-20 – 2023-04-25 (×6): 5 mg via ORAL
  Filled 2023-04-20 (×6): qty 1

## 2023-04-20 MED ORDER — SODIUM CHLORIDE 0.9% FLUSH
3.0000 mL | Freq: Two times a day (BID) | INTRAVENOUS | Status: DC
  Administered 2023-04-20: 3 mL via INTRAVENOUS

## 2023-04-20 MED ORDER — MIDODRINE HCL 5 MG PO TABS: 2.5000 mg | ORAL_TABLET | Freq: Three times a day (TID) | ORAL | Status: DC

## 2023-04-20 MED ORDER — AMIODARONE HCL IN DEXTROSE 360-4.14 MG/200ML-% IV SOLN
30.0000 mg/h | INTRAVENOUS | Status: DC
  Administered 2023-04-20: 30 mg/h via INTRAVENOUS
  Administered 2023-04-21: 60 mg/h via INTRAVENOUS
  Administered 2023-04-21: 30 mg/h via INTRAVENOUS
  Administered 2023-04-21 – 2023-04-25 (×14): 60 mg/h via INTRAVENOUS
  Administered 2023-04-25 – 2023-04-29 (×8): 30 mg/h via INTRAVENOUS
  Filled 2023-04-20 (×25): qty 200

## 2023-04-20 MED ORDER — MEDIHONEY WOUND/BURN DRESSING EX PSTE
1.0000 | PASTE | Freq: Every day | CUTANEOUS | Status: DC
  Administered 2023-04-21 – 2023-04-29 (×8): 1 via TOPICAL
  Filled 2023-04-20 (×2): qty 44

## 2023-04-20 NOTE — Progress Notes (Addendum)
Around 0100 this morning. Slept briefly but was awake in room. Reported having another episode of shortness of breath. Reporting sensation of "difficulty of taking a breath". Also, expressing sensation of "wanting to die". Respiratory Therapy on unit assisting at this time as well. RT placing patient on SALTER 10 L of 02. Vitals obtained. On call Provider contacted and Charge Nurse made aware. Rapid Response contacted again. Rounding on patient second time with him repositioned few times during this episode, propped up with pillows, and respiratory intervention does endorse slight improvement with how he is feeling.

## 2023-04-20 NOTE — H&P (Signed)
Error   See H and P

## 2023-04-20 NOTE — Consult Note (Signed)
Advanced Heart Failure Team Consult Note   Primary Physician: [name], MD PCP-Cardiologist:  Arvilla Meres, MD  Reason for Consultation: atrial fibrillation, volume overload  HPI:    [name] [name] is a 77 y.o. male with a PMH of COPD, CAD, CABG 2018, ischemic cardiomyopathy EF 20-25%, paroxysmal A-fib, hypertension, type 2 diabetes mellitus, history of LV thrombus  who is seen today for evaluation of afib and volume overload at the request of Dr. Tenny Craw.   Patient with multiple recent admissions for atrial fibrillation, then subsequent retroperitoneal hematoma development. He had been discharged to rehab, but had worsening of his white count on 12/10. Drainage of his hematoma with lacticaseibacillus casei, treated with a week of IV vancomycin that was stopped on 12/16.  Over the past 2 days he has had increasing shortness of breath, worsening heart rate, and was found to be in atrial fibrillation with RVR.  He was seen by Dr. Tenny Craw this morning and admitted to heart for further management.  His blood pressure remained stable, he is moderately volume overloaded with elevated jugular venous pressure and orthopnea.  Difficulty with PICC line placement in the past given his renal dysfunction as well as ICD and CCM placement.      Objective:    Vital Signs:   Temp:  [97.5 F (36.4 C)-97.6 F (36.4 C)] 97.6 F (36.4 C) (12/21 1315) Pulse Rate:  [112] 112 (12/21 1315) Resp:  [17-25] 22 (12/21 1400) BP: (104-151)/(68-93) 130/78 (12/21 1400) SpO2:  [93 %-99 %] 93 % (12/21 1400) Weight:  [72.1 kg] 72.1 kg (12/21 1315)    Weight change: Filed Weights   04/20/23 1315  Weight: 72.1 kg    Intake/Output:   Intake/Output Summary (Last 24 hours) at 04/20/2023 1438 Last data filed at 04/20/2023 1400 Gross per 24 hour  Intake 168.24 ml  Output 125 ml  Net 43.24 ml      Physical Exam    General: Chronically ill-appearing, cachectic HEENT: NCAT Cor: Irregular rate and  rhythm, systolic murmur, JVP moderately elevated, lower extremity edema 2+ on the right Lungs: Mild tachypnea, increased work of breathing Abdomen: soft, nontender, nondistended Extremities: no cyanosis, clubbing, rash Neuro: alert & orientedx3, cranial nerves grossly intact. moves all 4 extremities w/o difficulty. Affect pleasant Vascular: 2+ radial pulses   Telemetry   Atrial fibrillation with rates in the 110s  Labs and medications reviewed in Epic.  Patient Profile   [name] [name] is a 77 y.o. male with a past medical history of systolic heart failure, atrial fibrillation, atrial flutter, CKD, recent retroperitoneal hematoma who presents for acute on chronic systolic heart failure and atrial fibrillation with RVR from rehab.  Assessment/Plan   Acute on chronic systolic heart failure: Advanced disease, previously required milrinone for diuresis during his last admission.  Was discharged on midodrine without significant GDMT. He does appear moderately volume overloaded but his weight is still down significantly from admission, though has severe malnutrition. -Aggressive IV diuresis, 160 Mg x 2 -Management of atrial fibrillation as noted below -Defer PICC placement given difficulty with access, dual sided devices -If poor response to diuretics will consider low-dose milrinone -GDMT limited by blood pressure, on midodrine 5 mg 3 times daily -Extremely poor long-term prognosis, does not appear to be in advanced therapies candidate  Atrial fibrillation with RVR: Started 2 days ago, has been cardioverted multiple times.  While may have been triggered by worsening volume overload, also recently had severe leukocytosis to 43, though this  appears to be improving. -Management of infective component per hospitalist team -Continue new IV amiodarone at 60mg  -Continue anticoagulation, which was restarted on 12/4 -cardioversion after IV diuresis  CAD: CABG 2018. - apixaban, statin  CKD:  Limits ability to diuresis, advanced therapies  Diabetes: - SSI  Severe malnutrition  Length of Stay: 0  Romie Minus, MD  04/20/2023, 2:38 PM  Advanced Heart Failure Team Pager [phone] (M-F; 7a - 5p)  Please contact [facility] for night-coverage after hours (4p -7a ) and weekends on [email or web address]

## 2023-04-20 NOTE — Consult Note (Addendum)
Medical Consultation   [name] [name]  WGN:[id number]  DOB: [birth year]  DOA: 04/20/2023  PCP: [name], MD   Requesting physician:Dr.Kirstens  Reason for consultation: Hypoxia, A-fib RVR History of Present Illness: [name] [name] is an 77 y.o. male with COPD, CAD, CABG 2018, ischemic cardiomyopathy EF 20-25%, paroxysmal A-fib, hypertension, type 2 diabetes mellitus, history of LV thrombus.   -Admitted 11/4 with syncope fall cardioverted 11/7, discharged 11/8 -Readmitted 11/25 with increased right hip pain, imaging noted retroperitoneal hematoma extending to distal psoas and iliacus and also volume overloaded, followed by nephrology and cards then, started on midodrine, discharged to rehab 12/9 on torsemide -In rehab he had worsening leukocytosis up to 43K, infectious disease consulted 12/10, on 12/12 underwent ultrasound-guided aspiration of right hip, 30 cc of turbid brownish fluid drained, Cx w LACTICASEIBACILLUS CASEI, he was treated with few days of IV vancomycin which does not have efficacy against this, leukocytosis largely spontaneously started improving, vancomycin discontinued 12/16.  Last 2 days had increased shortness of breath, elevated heart rate etc. Seen by cardiology this morning, he is being admitted to 2 heart, TRH consulted as well  Review of Systems:   As per HPI otherwise 14 point review of systems negative.    Review of Systems Past Medical History: Past Medical History:  Diagnosis Date   AICD (automatic cardioverter/defibrillator) present    Arthritis    Bladder cancer (HCC)    Borderline glaucoma    Chronic systolic (congestive) heart failure (HCC)    Coronary artery disease    Diabetes mellitus type 2, controlled (HCC) ORAL MED   Essential hypertension    Heart murmur    Hepatitis    Medical history non-contributory    Paroxysmal A-fib (HCC)    Paroxysmal A-fib (HCC) 08/20/2020   Peripheral vascular disease (HCC)     Pneumonia     Past Surgical History: Past Surgical History:  Procedure Laterality Date   CARDIOVERSION N/A 08/10/2021   Procedure: CARDIOVERSION;  Surgeon: Dolores Patty, MD;  Location: [facility] ENDOSCOPY;  Service: Cardiovascular;  Laterality: N/A;   CARDIOVERSION N/A 11/09/2022   Procedure: CARDIOVERSION;  Surgeon: Maisie Fus, MD;  Location: [facility] INVASIVE CV LAB;  Service: Cardiovascular;  Laterality: N/A;   CARDIOVERSION N/A 03/07/2023   Procedure: CARDIOVERSION (CATH LAB);  Surgeon: Tessa Lerner, DO;  Location: [facility] INVASIVE CV LAB;  Service: Cardiovascular;  Laterality: N/A;   CAROTID DUPLEX SCAN  07-26-2010   BILATERAL ICA  STENOSIS 1% - 39%   CCM GENERATOR INSERTION N/A 01/04/2023   Procedure: CCM GENERATOR INSERTION;  Surgeon: Duke Salvia, MD;  Location[facility] INVASIVE CV LAB;  Service: Cardiovascular;  Laterality: N/A;   CHOLECYSTECTOMY N/A 08/22/2020   Procedure: LAPAROSCOPIC CHOLECYSTECTOMY;  Surgeon: Emelia Loron, MD;  Location: [facility] OR;  Service: General;  Laterality: N/A;   CORONARY ARTERY BYPASS GRAFT N/A 04/08/2017   Procedure: CORONARY ARTERY BYPASS GRAFTING (CABG) TIMES TWO USING LEFT INTERNAL MAMMARY ARTERY AND LEFT SAPHENOUS LEG VEIN HARVESTED ENDOSCOPICALLY.  LEG VEIN ALSO HARVESTED FROM THE RIGHT LEG;  Surgeon: Alleen Borne, MD;  Location: [facility] OR;  Service: Open Heart Surgery;  Laterality: N/A;   CYSTOSCOPY WITH BIOPSY N/A 08/01/2012   Procedure: CYSTOSCOPY WITH BIOPSY BLADDER BIOPSY   ;  Surgeon: Antony Haste, MD;  Location: [facility];  Service: Urology;  Laterality: N/A;   ERCP N/A 08/21/2020   Procedure: ENDOSCOPIC RETROGRADE  CHOLANGIOPANCREATOGRAPHY (ERCP);  Surgeon: Jeani Hawking, MD;  Location: [facility] ENDOSCOPY;  Service: Endoscopy;  Laterality: N/A;   FULGURATION OF BLADDER TUMOR N/A 08/01/2012   Procedure: FULGURATION OF BLADDER TUMOR;  Surgeon: Antony Haste, MD;  Location: [facility];  Service: Urology;   Laterality: N/A;   ICD IMPLANT  11/20/2017   ICD IMPLANT N/A 11/20/2017   Procedure: ICD IMPLANT;  Surgeon: Duke Salvia, MD;  Location: [facility] INVASIVE CV LAB;  Service: Cardiovascular;  Laterality: N/A;   IR FLUORO GUIDE CV LINE RIGHT  04/01/2023   IR US GUIDE VASC ACCESS RIGHT  04/01/2023   LEFT HEART CATH AND CORONARY ANGIOGRAPHY N/A 03/31/2017   Procedure: LEFT HEART CATH AND CORONARY ANGIOGRAPHY;  Surgeon: Marykay Lex, MD;  Location: [facility] INVASIVE CV LAB;  Service: Cardiovascular;  Laterality: N/A;   LUMBAR LAMINECTOMY/DECOMPRESSION MICRODISCECTOMY Bilateral 06/16/2019   Procedure: Bilateral Lumbar Four-Five Laminectomy and Foraminotomy;  Surgeon: Julio Sicks, MD;  Location: [facility] OR;  Service: Neurosurgery;  Laterality: Bilateral;  posterior   REMOVAL OF STONES  08/21/2020   Procedure: REMOVAL OF STONES;  Surgeon: Jeani Hawking, MD;  Location: [facility] ENDOSCOPY;  Service: Endoscopy;;   RIGHT HEART CATH Right 03/31/2017   Procedure: RIGHT HEART CATH;  Surgeon: Marykay Lex, MD;  Location: [facility] INVASIVE CV LAB;  Service: Cardiovascular;  Laterality: Right;   SPHINCTEROTOMY  08/21/2020   Procedure: SPHINCTEROTOMY;  Surgeon: Jeani Hawking, MD;  Location: [facility] ENDOSCOPY;  Service: Endoscopy;;   TEE WITHOUT CARDIOVERSION N/A 04/08/2017   Procedure: TRANSESOPHAGEAL ECHOCARDIOGRAM (TEE);  Surgeon: Alleen Borne, MD;  Location: [facility] OR;  Service: Open Heart Surgery;  Laterality: N/A;   TRANSESOPHAGEAL ECHOCARDIOGRAM (CATH LAB) N/A 03/07/2023   Procedure: TRANSESOPHAGEAL ECHOCARDIOGRAM;  Surgeon: Tessa Lerner, DO;  Location: [facility] INVASIVE CV LAB;  Service: Cardiovascular;  Laterality: N/A;   TRANSURETHRAL RESECTION OF BLADDER TUMOR  05/29/2011   Procedure: TRANSURETHRAL RESECTION OF BLADDER TUMOR (TURBT);  Surgeon: Antony Haste, MD;  Location: [facility];  Service: Urology;  Laterality: N/A;   VENTRICULAR ASSIST DEVICE INSERTION Right 03/31/2017   Procedure: VENTRICULAR ASSIST DEVICE  INSERTION;  Surgeon: Marykay Lex, MD;  Location: [facility] INVASIVE CV LAB;  Service: Cardiovascular;  Laterality: Right;     Allergies:   Allergies  Allergen Reactions   Spironolactone Other (See Comments)    Painful gynecomastia   Semaglutide     Nausea and bloating   Tramadol Other (See Comments)    Sleep disturbance.  Apnea spells.   Empagliflozin Rash    Rash at the groin Jardiance   Niacin And Related Hives     Social History:  reports that he quit smoking about 6 years ago. His smoking use included cigarettes. He started smoking about 46 years ago. He has a 40 pack-year smoking history. He has never used smokeless tobacco. He reports that he does not currently use alcohol. He reports that he does not use drugs.   Family History: Family History  Problem Relation Age of Onset   Heart attack Sister    Heart disease Sister     Physical Exam: Vitals:   04/20/23 1000 04/20/23 1015  BP: (!) 136/92 114/68  Resp: 17 (!) 24  SpO2: 97% 96%   Gen: Chronically ill thinly built male sitting up in bed, AAOx3 HEENT: Positive JVD CVS: S1-S2, irregular rhythm Lungs: Bilateral Rales Abdomen: Soft, nontender, bowel sounds present Extremities: Edema in upper thighs, flank, 1+ right lower extremity   Data reviewed:  I have personally reviewed following labs and imaging studies Labs:  CBC: Recent Labs  Lab 04/15/23 0535 04/18/23 0554 04/19/23 1037 04/20/23 0847  WBC 19.5* 13.1* 13.8* 16.0*  NEUTROABS  --  11.3* 12.0*  --   HGB 10.2* 10.0* 10.3* 10.5*  HCT 31.0* 30.1* 31.4* 32.3*  MCV 79.3* 80.1 80.7 81.0  PLT 640* 662* 645* 662*    Basic Metabolic Panel: Recent Labs  Lab 04/14/23 0648 04/15/23 0535 04/18/23 0554 04/19/23 0512 04/20/23 0847  NA 133* 132* 130* 131* 129*  K 3.9 4.1 3.6 4.0 4.3  CL 96* 95* 93* 94* 90*  CO2 28 26 27 28 26   GLUCOSE 160* 142* 122* 111* 166*  BUN 33* 33* 37* 36* 42*  CREATININE 2.25* 2.00* 2.52* 2.30* 2.46*  CALCIUM 7.8* 7.9* 7.7*  8.0* 8.2*  MG  --   --   --  1.9  --   PHOS  --   --   --  5.0*  --    GFR Estimated Creatinine Clearance: 25.6 mL/min (A) (by C-G formula based on SCr of 2.46 mg/dL (H)). Liver Function Tests: Recent Labs  Lab 04/15/23 0535 04/18/23 0554 04/20/23 0847  AST 61* 46* 70*  ALT 58* 41 45*  ALKPHOS 116 112 156*  BILITOT 1.4* 1.2* 1.3*  PROT 4.9* 4.7* 5.7*  ALBUMIN 1.7* 1.7* 2.0*   No results for input(s): "LIPASE", "AMYLASE" in the last 168 hours. No results for input(s): "AMMONIA" in the last 168 hours. Coagulation profile No results for input(s): "INR", "PROTIME" in the last 168 hours.  Cardiac Enzymes: No results for input(s): "CKTOTAL", "CKMB", "CKMBINDEX", "TROPONINI" in the last 168 hours. BNP: Invalid input(s): "POCBNP" CBG: Recent Labs  Lab 04/19/23 0603 04/19/23 1123 04/19/23 1619 04/19/23 2021 04/20/23 0554  GLUCAP 104* 149* 151* 169* 139*   D-Dimer No results for input(s): "DDIMER" in the last 72 hours. Hgb A1c No results for input(s): "HGBA1C" in the last 72 hours. Lipid Profile No results for input(s): "CHOL", "HDL", "LDLCALC", "TRIG", "CHOLHDL", "LDLDIRECT" in the last 72 hours. Thyroid function studies No results for input(s): "TSH", "T4TOTAL", "T3FREE", "THYROIDAB" in the last 72 hours.  Invalid input(s): "FREET3" Anemia work up No results for input(s): "VITAMINB12", "FOLATE", "FERRITIN", "TIBC", "IRON", "RETICCTPCT" in the last 72 hours. Urinalysis    Component Value Date/Time   COLORURINE YELLOW 04/07/2023 1810   APPEARANCEUR CLEAR 04/07/2023 1810   LABSPEC 1.009 04/07/2023 1810   PHURINE 5.0 04/07/2023 1810   GLUCOSEU NEGATIVE 04/07/2023 1810   HGBUR NEGATIVE 04/07/2023 1810   BILIRUBINUR NEGATIVE 04/07/2023 1810   KETONESUR NEGATIVE 04/07/2023 1810   PROTEINUR NEGATIVE 04/07/2023 1810   NITRITE NEGATIVE 04/07/2023 1810   LEUKOCYTESUR NEGATIVE 04/07/2023 1810     Sepsis Labs Recent Labs  Lab 04/15/23 0535 04/18/23 0554  04/19/23 1037 04/20/23 0847  WBC 19.5* 13.1* 13.8* 16.0*   Microbiology Recent Results (from the past 240 hours)  Aerobic/Anaerobic Culture w Gram Stain (surgical/deep wound)     Status: Abnormal   Collection Time: 04/11/23  1:39 PM   Specimen: Abscess  Result Value Ref Range Status   Specimen Description ABSCESS  Final   Special Requests right hip  Final   Gram Stain   Final    ABUNDANT WBC PRESENT, PREDOMINANTLY PMN FEW GRAM POSITIVE RODS    Culture (A)  Final    LACTICASEIBACILLUS CASEI Standardized susceptibility testing for this organism is not available. NO ANAEROBES ISOLATED Performed at [facility], [address] [place],  [place] [address]    Report Status 04/16/2023 FINAL  Final       Inpatient Medications:   Scheduled Meds:  acetaminophen  1,000 mg Oral TID   apixaban  5 mg Oral BID   [START ON 04/21/2023] atorvastatin  80 mg Oral Daily   calcium-vitamin D  1 tablet Oral BID WC   Chlorhexidine Gluconate Cloth  6 each Topical Daily   Gerhardt's butt cream   Topical BID   insulin aspart  0-15 Units Subcutaneous TID WC   insulin aspart  0-5 Units Subcutaneous QHS   [START ON 04/21/2023] leptospermum manuka honey  1 Application Topical Daily   midodrine  2.5 mg Oral TID WC   [START ON 04/21/2023] polyethylene glycol  17 g Oral Daily   senna-docusate  2 tablet Oral BID   sodium bicarbonate  650 mg Oral BID   Continuous Infusions:  amiodarone     amiodarone     amiodarone     amiodarone       Radiological Exams on Admission: [place] EKG SITE RITE Result Date: 04/20/2023 If Site Rite image not attached, placement could not be confirmed due to current cardiac rhythm.  VAS [place] LOWER EXTREMITY VENOUS (DVT) Result Date: 04/20/2023  Lower Venous DVT Study Patient Name:  [name] [name]  Date of Exam:   04/19/2023 Medical Rec #: [id number]         Accession #:    [id number] Date of Birth: Sep 09, 1945         Patient Gender: M Patient Age:   89 years Exam  Location:  [facility] Procedure:      VAS [place] LOWER EXTREMITY VENOUS (DVT) Referring Phys: Elijah Birk --------------------------------------------------------------------------------  Indications: Swelling, Pain, and right calf. Other Indications: Recent fall after syncopal episode on 11/4. Comparison Study: 04/08/23 -Negative Performing Technologist: Marilynne Halsted RDMS, RVT  Examination Guidelines: A complete evaluation includes B-mode imaging, spectral Doppler, color Doppler, and power Doppler as needed of all accessible portions of each vessel. Bilateral testing is considered an integral part of a complete examination. Limited examinations for reoccurring indications may be performed as noted. The reflux portion of the exam is performed with the patient in reverse Trendelenburg.  +---------+---------------+---------+-----------+----------+--------------+ RIGHT    CompressibilityPhasicitySpontaneityPropertiesThrombus Aging +---------+---------------+---------+-----------+----------+--------------+ CFV      Full           Yes      Yes                                 +---------+---------------+---------+-----------+----------+--------------+ SFJ      Full                                                        +---------+---------------+---------+-----------+----------+--------------+ FV Prox  Full                                                        +---------+---------------+---------+-----------+----------+--------------+ FV Mid   Full                                                        +---------+---------------+---------+-----------+----------+--------------+  FV DistalFull                                                        +---------+---------------+---------+-----------+----------+--------------+ PFV      Full                                                        +---------+---------------+---------+-----------+----------+--------------+  POP      Full           Yes      Yes                                 +---------+---------------+---------+-----------+----------+--------------+ PTV      Full                                                        +---------+---------------+---------+-----------+----------+--------------+ PERO     Full                                                        +---------+---------------+---------+-----------+----------+--------------+   +---------+---------------+---------+-----------+----------+--------------+ LEFT     CompressibilityPhasicitySpontaneityPropertiesThrombus Aging +---------+---------------+---------+-----------+----------+--------------+ CFV      Full                    Yes                                 +---------+---------------+---------+-----------+----------+--------------+ SFJ      Full                                                        +---------+---------------+---------+-----------+----------+--------------+ FV Prox  Full                                                        +---------+---------------+---------+-----------+----------+--------------+ FV Mid   Full                                                        +---------+---------------+---------+-----------+----------+--------------+ FV DistalFull                                                        +---------+---------------+---------+-----------+----------+--------------+  PFV      Full                                                        +---------+---------------+---------+-----------+----------+--------------+ POP      Full           Yes      Yes                                 +---------+---------------+---------+-----------+----------+--------------+ PTV      Full                                                        +---------+---------------+---------+-----------+----------+--------------+ PERO     Full                                                         +---------+---------------+---------+-----------+----------+--------------+     Summary: BILATERAL: - No evidence of deep vein thrombosis seen in the lower extremities, bilaterally. -No evidence of popliteal cyst, bilaterally.   *See table(s) above for measurements and observations. Electronically signed by Coral Else MD on 04/20/2023 at 10:03:45 AM.    Final    DG Chest Port 1 View Result Date: 04/19/2023 CLINICAL DATA:  Shortness of breath for a day EXAM: PORTABLE CHEST 1 VIEW COMPARISON:  X-ray 04/09/2023.  Older exams as well. FINDINGS: Sternal wires. Enlarged cardiopericardial silhouette with a calcified aorta. Bilateral upper chest battery packs with leads along the right side of the heart. Please correlate with history. Increasing small bilateral pleural effusions with some adjacent opacity. Mild interstitial prominence. No pneumothorax. IMPRESSION: Postop chest with enlarged heart. 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 Increasing small bilateral pleural effusions with some vascular congestion and trace interstitial prominence. Recommend follow up Electronically Signed   By: Karen Kays M.D.   On: 04/19/2023 12:21    Impression/Recommendations  Acute on chronic systolic CHF, ischemic cardiomyopathy -Last echo 11/24 with EF 20-25%, mildly reduced RV -Significantly volume overloaded, compounded by severe hypoalbuminemia and third spacing -IV Lasix 80 Mg x 1 now followed by 40 Mg twice daily -On midodrine -Add GDMT slowly if blood pressure tolerates  Atrial fibrillation RVR -On amiodarone, prior cardioversions -Starting IV amiodarone -Per cardiology, -Anticoagulation resumed -on apixaban  Retroperitoneal hematoma extending to distal psoas and iliacus -Following mechanical fall -12/12 underwent ultrasound-guided aspiration of right hip, 30 cc of turbid brownish fluid drained, Cx w LACTICASEIBACILLUS CASEI, he was treated with few days of IV  vancomycin which has no activity against this, leukocytosis largely spontaneously started improving, vancomycin discontinued 12/16, ID signed off -Has persistent leukocytosis, thrombocytosis, monitor off antibiotics today  Leukocytosis -See discussion above   CAD  Hx CABG 2018 -Stable   AKI, CKD 3B/4 -Baseline creatinine around 2-2.2 -Relatively stable, monitor with diuresis  Chronic anemia  Type 2 diabetes mellitus -Recent A1c was 6.9 -On Ozempic at baseline, off metformin now -CBGs are stable, continue SSI,  if starts trending up will add long-acting insulin   Severe hypoalbuminemia -No clear history of cirrhosis -?  Malnutrition alone versus combination with chronic liver disease -Dietitian consult  GOALS: 77/M with severe cardiomyopathy, A-fib RVR, severe hypoalbuminemia and third spacing, retroperitoneal hematoma not on anticoagulation, I think his prognosis is not very good, discussed CODE STATUS with patient and wife, he wishes to be a full code with full scope    Time Spent:  Zannie Cove M.D. [facility] 04/20/2023, 11:05 AM

## 2023-04-20 NOTE — H&P (Addendum)
Expand All Collapse All      Cardiology Admission History and Physical    Patient ID: KADESH SULLO MRN: [id number]; DOB: 1945/08/13    Admission date: 04/08/2023   PCP:  [name], MD              [facility] Providers Cardiologist:  Arvilla Meres, MD  Electrophysiologist:  Sherryl Manges, MD  Advanced Heart Failure:  Arvilla Meres, MD      Chief Complaint:  I had a very bad night  Very SOB    Patient Profile:    KRISHANG LOHRMANN is a 77 y.o. male with hx of CAD, ICM and atrial flutter  who is being seen 04/20/2023 for the evaluation of worsening CHF .   History of Present Illness:    Mr. Skellie is a 77 yo with hx of T2DM, COPD (quit 2018), severe 3V CAD (s/p CABG in 2018) ICM (LVEF 20 to 25%), HTN, PAF  03/2017  Anterior STEMI  Shock LHC showed sever 3V CAD   Sepsis.  Eventuall CABG x 2 (LIMA to LAD and SVG ot OM)  Post op afib      07/2017 Echo LVEF 35 to 40% with apical thrombus   Mod/severe MR 09/2017 MRI LVEF 30%   Thrombus noted    10/2017.  ICD     12/2019   AFib with RVR 10/2020 LVEF 20 to 25%    07/2021  Aflutter with RVR  underwent DCCV   ER next day in CHF   Back in aflutter with RVR  Started on Amiodarone   Underwent DCCV  to SR Echo May 2024 LVEF 25 to 30%    July 2024  Atrial flutter with RVR  Pt underwent   DCCV 11/09/22 Sept 6, 2024   CCM insertion    Followed by D Bensimhon in CHF clinic   Last seen in September 2024   March 04, 2023  Syncope Fell down stairs Hit head  Went to ER  Rshoulder/R hip pain   ER    Fevers  Admitted  Pt in afib   Underwent TEE/DCCV on 03/07/23 Discharged 03/08/23   Admitted 11/25 with increased R hip pain  CT with retroperitoneal hematoma  Chest CT with ground glass infitrates  Cardiology consulted for volume overload  Cardiology and renal also consulted    Patient started on midrinone 04/01/23    Discontinued on 04/07/23   Torsemide resumed    Patient admitted to Rehab service on 04/08/23  Review of notes from  Rehab: 04/09/23   [id number]   Infectious disease contacted  ? Infectiion vs recent steroid use   Fluid collection aspirated with 300cc removed  on 04/11/23  IR did asp of 300 cc (R hip area)  Started on Vancomycin  D Fluid gram stain showed Gram + rods   Culture for organism felt be contaminant   Vanco stopped on 12/16  ID signed off    04/17/23  Note that patient had higher HR with rates 70s to 120s     On midodrine for hypotension    12/19  HR again noted to be high  Pt orthostatic    04/19/23   AM  tachy 110s t o120s   SOme SOB   Placed on 2L [place]    Sats in mid 90s    CXR with bilateral effusion    Lasix given   HR 106  On 2L     Midodine decreased to 2.5 tid  Note to consider for cardiology consult over weekend    Pt called cardiology this am ton call MD complaining of continued SOB Pt noted his weights up had increased LE swellng    Cardiology fellow unaware of institution.   Told to go to ER   The pt continues to be SOB  " I had a rough night"     Past Medical History:  Diagnosis Date   AICD (automatic cardioverter/defibrillator) present     Arthritis     Bladder cancer (HCC)     Borderline glaucoma     Chronic systolic (congestive) heart failure (HCC)     Coronary artery disease     Diabetes mellitus type 2, controlled (HCC) ORAL MED   Essential hypertension     Heart murmur     Hepatitis     Medical history non-contributory     Paroxysmal A-fib (HCC)     Paroxysmal A-fib (HCC) 08/20/2020   Peripheral vascular disease (HCC)     Pneumonia                 Past Surgical History:  Procedure Laterality Date   CARDIOVERSION N/A 08/10/2021    Procedure: CARDIOVERSION;  Surgeon: Dolores Patty, MD;  Location: [facility] ENDOSCOPY;  Service: Cardiovascular;  Laterality: N/A;   CARDIOVERSION N/A 11/09/2022    Procedure: CARDIOVERSION;  Surgeon: Maisie Fus, MD;  Location: [facility] INVASIVE CV LAB;  Service: Cardiovascular;  Laterality: N/A;   CARDIOVERSION N/A 03/07/2023    Procedure:  CARDIOVERSION (CATH LAB);  Surgeon: Tessa Lerner, DO;  Location: [facility] INVASIVE CV LAB;  Service: Cardiovascular;  Laterality: N/A;   CAROTID DUPLEX SCAN   07-26-2010    BILATERAL ICA  STENOSIS 1% - 39%   CCM GENERATOR INSERTION N/A 01/04/2023    Procedure: CCM GENERATOR INSERTION;  Surgeon: Duke Salvia, MD;  Location: [facility] INVASIVE CV LAB;  Service: Cardiovascular;  Laterality: N/A;   CHOLECYSTECTOMY N/A 08/22/2020    Procedure: LAPAROSCOPIC CHOLECYSTECTOMY;  Surgeon: Emelia Loron, MD;  Location: [facility] OR;  Service: General;  Laterality: N/A;   CORONARY ARTERY BYPASS GRAFT N/A 04/08/2017    Procedure: CORONARY ARTERY BYPASS GRAFTING (CABG) TIMES TWO USING LEFT INTERNAL MAMMARY ARTERY AND LEFT SAPHENOUS LEG VEIN HARVESTED ENDOSCOPICALLY.  LEG VEIN ALSO HARVESTED FROM THE RIGHT LEG;  Surgeon: Alleen Borne, MD;  Location: [facility] OR;  Service: Open Heart Surgery;  Laterality: N/A;   CYSTOSCOPY WITH BIOPSY N/A 08/01/2012    Procedure: CYSTOSCOPY WITH BIOPSY BLADDER BIOPSY   ;  Surgeon: Antony Haste, MD;  Location: [facility];  Service: Urology;  Laterality: N/A;   ERCP N/A 08/21/2020    Procedure: ENDOSCOPIC RETROGRADE CHOLANGIOPANCREATOGRAPHY (ERCP);  Surgeon: Jeani Hawking, MD;  Location: [facility] ENDOSCOPY;  Service: Endoscopy;  Laterality: N/A;   FULGURATION OF BLADDER TUMOR N/A 08/01/2012    Procedure: FULGURATION OF BLADDER TUMOR;  Surgeon: Antony Haste, MD;  Location: [facility];  Service: Urology;  Laterality: N/A;   ICD IMPLANT   11/20/2017   ICD IMPLANT N/A 11/20/2017    Procedure: ICD IMPLANT;  Surgeon: Duke Salvia, MD;  Location: [facility] INVASIVE CV LAB;  Service: Cardiovascular;  Laterality: N/A;   IR FLUORO GUIDE CV LINE RIGHT   04/01/2023   IR US GUIDE VASC ACCESS RIGHT   04/01/2023   LEFT HEART CATH AND CORONARY ANGIOGRAPHY N/A 03/31/2017    Procedure: LEFT HEART CATH AND CORONARY ANGIOGRAPHY;  Surgeon: Marykay Lex, MD;  Location:  [facility] INVASIVE  CV LAB;  Service: Cardiovascular;  Laterality: N/A;   LUMBAR LAMINECTOMY/DECOMPRESSION MICRODISCECTOMY Bilateral 06/16/2019    Procedure: Bilateral Lumbar Four-Five Laminectomy and Foraminotomy;  Surgeon: Julio Sicks, MD;  Location: [facility] OR;  Service: Neurosurgery;  Laterality: Bilateral;  posterior   REMOVAL OF STONES   08/21/2020    Procedure: REMOVAL OF STONES;  Surgeon: Jeani Hawking, MD;  Location: [facility] ENDOSCOPY;  Service: Endoscopy;;   RIGHT HEART CATH Right 03/31/2017    Procedure: RIGHT HEART CATH;  Surgeon: Marykay Lex, MD;  Location: [facility] INVASIVE CV LAB;  Service: Cardiovascular;  Laterality: Right;   SPHINCTEROTOMY   08/21/2020    Procedure: SPHINCTEROTOMY;  Surgeon: Jeani Hawking, MD;  Location: [facility] ENDOSCOPY;  Service: Endoscopy;;   TEE WITHOUT CARDIOVERSION N/A 04/08/2017    Procedure: TRANSESOPHAGEAL ECHOCARDIOGRAM (TEE);  Surgeon: Alleen Borne, MD;  Location: [facility] OR;  Service: Open Heart Surgery;  Laterality: N/A;   TRANSESOPHAGEAL ECHOCARDIOGRAM (CATH LAB) N/A 03/07/2023    Procedure: TRANSESOPHAGEAL ECHOCARDIOGRAM;  Surgeon: Tessa Lerner, DO;  Location: [facility] INVASIVE CV LAB;  Service: Cardiovascular;  Laterality: N/A;   TRANSURETHRAL RESECTION OF BLADDER TUMOR   05/29/2011    Procedure: TRANSURETHRAL RESECTION OF BLADDER TUMOR (TURBT);  Surgeon: Antony Haste, MD;  Location: [facility];  Service: Urology;  Laterality: N/A;   VENTRICULAR ASSIST DEVICE INSERTION Right 03/31/2017    Procedure: VENTRICULAR ASSIST DEVICE INSERTION;  Surgeon: Marykay Lex, MD;  Location: [facility] INVASIVE CV LAB;  Service: Cardiovascular;  Laterality: Right;          Medications Prior to Admission:        Prior to Admission medications   Medication Sig Start Date End Date Taking? Authorizing Provider  oxymetazoline (DRISTAN) 0.05 % nasal spray Place 1 spray into both nostrils at bedtime.     Yes [provider]  sacubitril-valsartan (ENTRESTO) 49-51 MG Take 1 tablet by  mouth 2 (two) times daily.     Yes [provider]  acetaminophen (TYLENOL) 500 MG tablet Take 1,000 mg by mouth every 6 (six) hours as needed for mild pain (pain score 1-3), headache or fever.       [provider]  amiodarone (PACERONE) 200 MG tablet Take 1 tablet (200 mg total) by mouth daily. 04/08/23     Willeen Niece, MD  atorvastatin (LIPITOR) 80 MG tablet TAKE 1 TABLET BY MOUTH EVERY DAY 07/27/22     Bensimhon, Bevelyn Buckles, MD  benzonatate (TESSALON) 200 MG capsule Take 1 capsule (200 mg total) by mouth 3 (three) times daily as needed for cough. 04/08/23     Willeen Niece, MD  cyanocobalamin (VITAMIN B12) 250 MCG tablet Take 500 mcg by mouth daily.       [provider]  ELIQUIS 5 MG TABS tablet TAKE 1 TABLET TWICE A DAY Patient taking differently: Take 5 mg by mouth 2 (two) times daily. 10/03/22     Bensimhon, Bevelyn Buckles, MD  hydrOXYzine (ATARAX) 25 MG tablet Take 1 tablet (25 mg total) by mouth 3 (three) times daily as needed for itching. 04/08/23     Willeen Niece, MD  midodrine (PROAMATINE) 10 MG tablet Take 1 tablet (10 mg total) by mouth 3 (three) times daily with meals. 04/08/23 05/08/23   Willeen Niece, MD  oxyCODONE (OXY IR/ROXICODONE) 5 MG immediate release tablet Take 1 tablet (5 mg total) by mouth every 4 (four) hours as needed for moderate pain (pain score 4-6). 04/08/23     Khatri,  Pardeep, MD  torsemide (DEMADEX) 20 MG tablet Take 1 tablet (20 mg total) by mouth daily. 11/21/22     Andrey Farmer, PA-C  zolpidem (AMBIEN) 10 MG tablet Take 5 mg by mouth at bedtime. 12/13/22     [provider]      Allergies:    Allergies       Allergies  Allergen Reactions   Spironolactone Other (See Comments)      Painful gynecomastia   Semaglutide        Nausea and bloating   Tramadol Other (See Comments)      Sleep disturbance.  Apnea spells.   Empagliflozin Rash      Rash at the groin Jardiance   Niacin And Related Hives        Social  History:   Social History         Socioeconomic History   Marital status: Married      Spouse name: Not on file   Number of children: Not on file   Years of education: Not on file   Highest education level: Not on file  Occupational History   Occupation: retired  Tobacco Use   Smoking status: Former      Current packs/day: 0.00      Average packs/day: 1 pack/day for 40.0 years (40.0 ttl pk-yrs)      Types: Cigarettes      Start date: 03/31/1977      Quit date: 03/31/2017      Years since quitting: 6.0   Smokeless tobacco: Never  Vaping Use   Vaping status: Never Used  Substance and Sexual Activity   Alcohol use: Not Currently      Comment: rarely   Drug use: No   Sexual activity: Not on file  Other Topics Concern   Not on file  Social History Narrative   Not on file    Social Drivers of Health        Financial Resource Strain: Not on file  Food Insecurity: No Food Insecurity (03/26/2023)    Hunger Vital Sign     Worried About Running Out of Food in the Last Year: Never true     Ran Out of Food in the Last Year: Never true  Transportation Needs: No Transportation Needs (03/26/2023)    PRAPARE - Therapist, art (Medical): No     Lack of Transportation (Non-Medical): No  Recent Concern: Transportation Needs - Unmet Transportation Needs (03/11/2023)    Received from [facility]     In the past 12 months, has lack of reliable transportation kept you from medical appointments, meetings, work or from getting things needed for daily living? : Yes  Physical Activity: Inactive (07/11/2017)    Exercise Vital Sign     Days of Exercise per Week: 0 days     Minutes of Exercise per Session: 0 min  Stress: No Stress Concern Present (07/11/2017)    [employer] of Occupational Health - Occupational Stress Questionnaire     Feeling of Stress : Only a little  Social Connections: Not on file  Intimate Partner Violence: Not At Risk  (03/26/2023)    Humiliation, Afraid, Rape, and Kick questionnaire     Fear of Current or Ex-Partner: No     Emotionally Abused: No     Physically Abused: No     Sexually Abused: No    Family History:   The patient's family history includes  Heart attack in his sister; Heart disease in his sister.     ROS:  Please see the history of present illness.  All other ROS reviewed and negative.      Physical Exam/Data:          Vitals:    04/20/23 0300 04/20/23 0459 04/20/23 0700 04/20/23 0847  BP: 122/83 133/79 138/78 128/83  Pulse: (!) 116 (!) 119 (S) (!) 124 (!) 142  Resp: 20 20 (!) 21 (!) 24  Temp: 98 F (36.7 C) 98 F (36.7 C) 97.7 F (36.5 C) 97.8 F (36.6 C)  TempSrc: Oral Oral Oral Oral  SpO2: 96% 99% 99% 98%  Weight:          Height:              Intake/Output Summary (Last 24 hours) at 04/20/2023 0855 Last data filed at 04/19/2023 1800    Gross per 24 hour  Intake 250 ml  Output 220 ml  Net 30 ml        04/19/2023   10:23 AM 04/17/2023    6:08 AM 04/15/2023    6:29 AM  Last 3 Weights  Weight (lbs) 158 lb 15.2 oz 165 lb 5.5 oz 197 lb 8.5 oz  Weight (kg) 72.1 kg 75 kg 89.6 kg     Body mass index is 20.41 kg/m.  General:  Thin 77 yo in mild distress  HEENT: normal Neck: JVP is increased  Vascular: No carotid bruits; Distal pulses 2+ bilaterally   Cardiac:  Irreg irreg  Tachy  No signifcant murmurs  Lungs:  Rales at bases anteriorly  Abd: soft, nontender, no hepatomegaly  Ext:   1-2+ edema lower extremities to thig   Skin: Sacral ulcer   Excoriated lesion on L calf   Heel breakdown Neuro:  CNs 2-12 intact grossly intact Psych:  Normal affect      EKG:  The ECG that was done  was personally reviewed and demonstrates Afib with RVR   LBBB   137 bpm    Relevant CV Studies:   Echo   03/07/23  1. Left ventricular ejection fraction, by estimation, is 20 to 25%. The  left ventricle has severely decreased function. The left ventricular  internal cavity size  was dilated.   2. Right ventricular systolic function is mildly reduced. The right  ventricular size is not well visualized.   3. No left atrial/left atrial appendage thrombus was detected. The LAA  emptying velocity was 36 cm/s.   4. The mitral valve was not well visualized.   5. The aortic valve is normal in structure. Aortic valve regurgitation is  trivial. Aortic valve sclerosis is present, with no evidence of aortic  valve stenosis.   6. There is mild (Grade II) layered plaque involving the descending  aorta.   7. The atrial septum is grossly normal by Color Doppler.   Conclusion(s)/Recommendation(s): No LA/LAA thrombus identified. Successful  cardioversion performed with restoration of normal sinus rhythm.    LHC 2018 - L/RHC 03/31/2017 Prox RCA lesion is 100% stenosed. Dist RCA lesion is 100% stenosed (retrograde filling from collaterals does not go beyond the distal vessel) -->faint collateral filling to the distal PL and PDA from septal perforators and a diffusely diseased AV groove circumflex LAV Groove lesion is 90% stenosed. Prox LAD-1 lesion is 95% stenosed. Prox LAD-2 lesion is 85% stenosed. Heavily calcified, tandem lesions in very tortuous segment of the vessel. Prox Cx to Mid Cx lesion is 70% stenosed. Heavily  calcified There is severe left ventricular systolic dysfunction. The left ventricular ejection fraction is less than 25% by visual estimate. LV end diastolic pressure is severely elevated -prior to Impella insertion Hemodynamic findings consistent with moderate pulmonary hypertension. Successful Impella insertion with 3.5-3.6 LPM flow's Laboratory Data:   High Sensitivity Troponin:   Last Labs     Recent Labs  Lab 04/20/23 0432  TROPONINIHS 33*        Chemistry Last Labs      Recent Labs  Lab 04/18/23 0554 04/19/23 0512  NA 130* 131*  K 3.6 4.0  CL 93* 94*  CO2 27 28  GLUCOSE 122* 111*  BUN 37* 36*  CREATININE 2.52* 2.30*  CALCIUM 7.7* 8.0*   MG  --  1.9  GFRNONAA 26* 29*  ANIONGAP 10 9      Last Labs      Recent Labs  Lab 04/15/23 0535 04/18/23 0554  PROT 4.9* 4.7*  ALBUMIN 1.7* 1.7*  AST 61* 46*  ALT 58* 41  ALKPHOS 116 112  BILITOT 1.4* 1.2*      Lipids  Last Labs  No results for input(s): "CHOL", "TRIG", "HDL", "LABVLDL", "LDLCALC", "CHOLHDL" in the last 168 hours.   Hematology Last Labs      Recent Labs  Lab 04/18/23 0554 04/19/23 1037  WBC 13.1* 13.8*  RBC 3.76* 3.89*  HGB 10.0* 10.3*  HCT 30.1* 31.4*  MCV 80.1 80.7  MCH 26.6 26.5  MCHC 33.2 32.8  RDW 20.2* 20.5*  PLT 662* 645*      Thyroid  Last Labs  No results for input(s): "TSH", "FREET4" in the last 168 hours.   BNP Last Labs     Recent Labs  Lab 04/20/23 0432  BNP 2,698.9*      DDimer  Last Labs  No results for input(s): "DDIMER" in the last 168 hours.       Radiology/Studies:  DG Chest Port 1 View Result Date: 04/19/2023 CLINICAL DATA:  Shortness of breath for a day EXAM: PORTABLE CHEST 1 VIEW COMPARISON:  X-ray 04/09/2023.  Older exams as well. FINDINGS: Sternal wires. Enlarged cardiopericardial silhouette with a calcified aorta. Bilateral upper chest battery packs with leads along the right side of the heart. Please correlate with history. Increasing small bilateral pleural effusions with some adjacent opacity. Mild interstitial prominence. No pneumothorax. IMPRESSION: Postop chest with enlarged heart. 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 Increasing small bilateral pleural effusions with some vascular congestion and trace interstitial prominence. Recommend follow up Electronically Signed   By: Karen Kays M.D.   On: 04/19/2023 12:21    VAS [place] LOWER EXTREMITY VENOUS (DVT) Result Date: 04/19/2023  Lower Venous DVT Study Patient Name:  KAEVON CORIELL  Date of Exam:   04/19/2023 Medical Rec #: [id number]         Accession #:    [id number] Date of Birth: May 24, 1945         Patient Gender: M Patient Age:   44  years Exam Location:  [facility] Procedure:      VAS [place] LOWER EXTREMITY VENOUS (DVT) Referring Phys: Elijah Birk --------------------------------------------------------------------------------  Indications: Swelling, Pain, and right calf. Other Indications: Recent fall after syncopal episode on 11/4. Comparison Study: 04/08/23 -Negative Performing Technologist: Marilynne Halsted RDMS, RVT  Examination Guidelines: A complete evaluation includes B-mode imaging, spectral Doppler, color Doppler, and power Doppler as needed of all accessible portions of each vessel. Bilateral testing is considered an integral part of a complete examination.  Limited examinations for reoccurring indications may be performed as noted. The reflux portion of the exam is performed with the patient in reverse Trendelenburg.  +---------+---------------+---------+-----------+----------+--------------+ RIGHT    CompressibilityPhasicitySpontaneityPropertiesThrombus Aging +---------+---------------+---------+-----------+----------+--------------+ CFV      Full           Yes      Yes                                 +---------+---------------+---------+-----------+----------+--------------+ SFJ      Full                                                        +---------+---------------+---------+-----------+----------+--------------+ FV Prox  Full                                                        +---------+---------------+---------+-----------+----------+--------------+ FV Mid   Full                                                        +---------+---------------+---------+-----------+----------+--------------+ FV DistalFull                                                        +---------+---------------+---------+-----------+----------+--------------+ PFV      Full                                                         +---------+---------------+---------+-----------+----------+--------------+ POP      Full           Yes      Yes                                 +---------+---------------+---------+-----------+----------+--------------+ PTV      Full                                                        +---------+---------------+---------+-----------+----------+--------------+ PERO     Full                                                        +---------+---------------+---------+-----------+----------+--------------+   +---------+---------------+---------+-----------+----------+--------------+ LEFT     CompressibilityPhasicitySpontaneityPropertiesThrombus Aging +---------+---------------+---------+-----------+----------+--------------+ CFV      Full  Yes                                 +---------+---------------+---------+-----------+----------+--------------+ SFJ      Full                                                        +---------+---------------+---------+-----------+----------+--------------+ FV Prox  Full                                                        +---------+---------------+---------+-----------+----------+--------------+ FV Mid   Full                                                        +---------+---------------+---------+-----------+----------+--------------+ FV DistalFull                                                        +---------+---------------+---------+-----------+----------+--------------+ PFV      Full                                                        +---------+---------------+---------+-----------+----------+--------------+ POP      Full           Yes      Yes                                 +---------+---------------+---------+-----------+----------+--------------+ PTV      Full                                                         +---------+---------------+---------+-----------+----------+--------------+ PERO     Full                                                        +---------+---------------+---------+-----------+----------+--------------+    Summary: BILATERAL: - No evidence of deep vein thrombosis seen in the lower extremities, bilaterally. -No evidence of popliteal cyst, bilaterally.   *See table(s) above for measurements and observations.    Preliminary         Assessment and Plan:    Afib wit hRVR   Pt with hx of atrial flutter/fib  On amiodarone 200 mg per day  He has had multiple cardioversions for this in  past    Last in Nov 2024    It appears that he has probably been out of SR for at least a couple days   Has decompensated Plan Tx to ICU Start IV amiodarone to see if HR will slow    BP is adequate    If rates do not improve, pt remains symptomatic may need DCCV    Would be good to get more amiodarone load though  2  HFrEF  Pt with known ischemic cardiomyopathy   Volume is increased and pt with pulmonary edema on exam   Now on high flow Lozano    REcomm IV lasix 80 given x1    Follow response Amio IV for rate, rhythm control May again need to consider DCCV more urgently if does not respond  Pt does not do well when out of SR  CHF service notified Will place PICC line   Has been on milrinone in previous admits  Hold other meds for now to allow BP room  3   CAD  Hx CABG 2018   No symtpoms of CP   Follow   4.  Renal  Cr 2.46   In Sept 2024 Cr wsa 2-2.2   Follow with lasix     5  Orthostatic hypotension   WIll review   has been on midodrine up to 10 tid   Now on 2.5 tid   Follow for now  6  Heme   Anemia stable with Hgb of 10.5     WBC   Peak at over 40K   ? Steroids   Down to 16 K today     Last differential 86% neutrophils  7  DM  Per internal medicine  8   Debility  Pt with several wounds,  Alb 1.9       IM to follow  COntinue care           Code Status:Full code     Severity  of Illness: The appropriate patient status for this patient is INPATIENT. Inpatient status is judged to be reasonable and necessary in order to provide the required intensity of service to ensure the patient's safety. The patient's presenting symptoms, physical exam findings, and initial radiographic and laboratory data in the context of their chronic comorbidities is felt to place them at high risk for further clinical deterioration. Furthermore, it is not anticipated that the patient will be medically stable for discharge from the hospital within 2 midnights of admission.    * I certify that at the point of admission it is my clinical judgment that the patient will require inpatient hospital care spanning beyond 2 midnights from the point of admission due to high intensity of service, high risk for further deterioration and high frequency of surveillance required.*    For questions or updates, please contact [facility] Please consult [email or web address] for contact info under      Signed, Dietrich Pates, MD  04/20/2023 8:55 AM

## 2023-04-20 NOTE — Plan of Care (Signed)
  Problem: Activity: Goal: Risk for activity intolerance will decrease Outcome: Not Progressing   

## 2023-04-20 NOTE — Significant Event (Signed)
Rapid Response Event Note   Reason for Call :  Shortness of breath, fatigue  Initial Focused Assessment:  He is alert and oriented, lying in bed. He feels fatigues and short of breath. Breathing is labored with increased work of breathing, using accessory muscles.  He is pale but warm and dry.  Lung sounds decreased bases.  Heart tones irregular. Mild Edema JVD  BP 128/83  HR 120-140  RR 24  O2 sat 98% on 10L [place]  Oral temp 97.8   Interventions:  12 lead EKG done (Dr Tenny Craw at bedside) 80mg  Lasix IV Labs drawn  Readmit to ICU  Plan of Care:  Amiodarone gtt   Event Summary:   MD Notified: Dr Wynn Banker, Dr Tenny Craw, Dr Jomarie Longs Call Time: 0808 Arrival Time: 0815 End Time: 0945  Marcellina Millin, RN

## 2023-04-20 NOTE — Progress Notes (Signed)
Inpatient Rehabilitation Discharge Medication Review by a Pharmacist  A complete drug regimen review was completed for this patient to identify any potential clinically significant medication issues.  High Risk Drug Classes Is patient taking? Indication by Medication  Antipsychotic No   Anticoagulant Yes Apixaban - Afib   Antibiotic No   Opioid Yes Oxycodone prn pain  Antiplatelet No   Hypoglycemics/insulin Yes Insulin - DM  Vasoactive Medication Yes IV amiodarone - Afib IV furosemide - SOB Midodrine - BP  Chemotherapy No   Other Yes Atorvastatin - HLD Sodium bicarb - supplement Methocarbamol prn spasms Ondansetron prn N/V Zolpidem prn sleep     Type of Medication Issue Identified Description of Issue Recommendation(s)  Drug Interaction(s) (clinically significant)     Duplicate Therapy     Allergy     No Medication Administration End Date     Incorrect Dose     Additional Drug Therapy Needed     Significant med changes from prior encounter (inform family/care partners about these prior to discharge).    Other       Clinically significant medication issues were identified that warrant physician communication and completion of prescribed/recommended actions by midnight of the next day:  No  Name of provider notified for urgent issues identified:   Provider Method of Notification:     Pharmacist comments: Transfer back to acute  Time spent performing this drug regimen review (minutes):  20 minutes  Okey Regal, PharmD

## 2023-04-20 NOTE — Telephone Encounter (Signed)
[name] [name] called me this a.m. he states he has been having continued shortness of breath is rehabilitation center.  He states he was recently discharged on 04/08/2023 was not experiencing the symptoms.  He does not notice any worsening weight gain or leg swelling.  He states he cannot continue to have this shortness of breath.  Labs were obtained from the rehab center.  His proBNP (2,698) is improved from 2 weeks ago.  Troponin level 33.  His ECG shows what appears to be an ectopic atrial rhythm that looks more consistent with atrial flutter.  He has had prior ECGs with evidence of atrial fibrillation and atrial for flutter.  He underwent a TEE cardioversion on 11/7 and was successfully restored to normal sinus rhythm.  On his most recent hospitalization, it is stated that he was discharged in normal sinus rhythm.  I suspect his symptoms may be due to his ectopic atrial rhythm.  I instructed [name] [name] to come to the emergency department for further evaluation of his shortness of breath persisted.

## 2023-04-20 NOTE — Progress Notes (Signed)
PROGRESS NOTE   Subjective/Complaints:  Remains with SOB, tachypnea and elevated HR overnight Pt denies CP, no cough  Labs and CXR reviewed  ROS:  denies fevers, chills, N/V, abdominal pain, constipation, diarrhea, Objective:   DG Chest Port 1 View Result Date: 04/19/2023 CLINICAL DATA:  Shortness of breath for a day EXAM: PORTABLE CHEST 1 VIEW COMPARISON:  X-ray 04/09/2023.  Older exams as well. FINDINGS: Sternal wires. Enlarged cardiopericardial silhouette with a calcified aorta. Bilateral upper chest battery packs with leads along the right side of the heart. Please correlate with history. Increasing small bilateral pleural effusions with some adjacent opacity. Mild interstitial prominence. No pneumothorax. IMPRESSION: Postop chest with enlarged heart. 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 Increasing small bilateral pleural effusions with some vascular congestion and trace interstitial prominence. Recommend follow up Electronically Signed   By: Karen Kays M.D.   On: 04/19/2023 12:21   VAS [place] LOWER EXTREMITY VENOUS (DVT) Result Date: 04/19/2023  Lower Venous DVT Study Patient Name:  [name] [name]  Date of Exam:   04/19/2023 Medical Rec #: [id number]         Accession #:    [id number] Date of Birth: 11/23/1945         Patient Gender: M Patient Age:   77 years Exam Location:  [facility] Procedure:      VAS [place] LOWER EXTREMITY VENOUS (DVT) Referring Phys: Elijah Birk --------------------------------------------------------------------------------  Indications: Swelling, Pain, and right calf. Other Indications: Recent fall after syncopal episode on 11/4. Comparison Study: 04/08/23 -Negative Performing Technologist: Marilynne Halsted RDMS, RVT  Examination Guidelines: A complete evaluation includes B-mode imaging, spectral Doppler, color Doppler, and power Doppler as needed of all accessible portions of each vessel.  Bilateral testing is considered an integral part of a complete examination. Limited examinations for reoccurring indications may be performed as noted. The reflux portion of the exam is performed with the patient in reverse Trendelenburg.  +---------+---------------+---------+-----------+----------+--------------+ RIGHT    CompressibilityPhasicitySpontaneityPropertiesThrombus Aging +---------+---------------+---------+-----------+----------+--------------+ CFV      Full           Yes      Yes                                 +---------+---------------+---------+-----------+----------+--------------+ SFJ      Full                                                        +---------+---------------+---------+-----------+----------+--------------+ FV Prox  Full                                                        +---------+---------------+---------+-----------+----------+--------------+ FV Mid   Full                                                        +---------+---------------+---------+-----------+----------+--------------+  FV DistalFull                                                        +---------+---------------+---------+-----------+----------+--------------+ PFV      Full                                                        +---------+---------------+---------+-----------+----------+--------------+ POP      Full           Yes      Yes                                 +---------+---------------+---------+-----------+----------+--------------+ PTV      Full                                                        +---------+---------------+---------+-----------+----------+--------------+ PERO     Full                                                        +---------+---------------+---------+-----------+----------+--------------+   +---------+---------------+---------+-----------+----------+--------------+ LEFT      CompressibilityPhasicitySpontaneityPropertiesThrombus Aging +---------+---------------+---------+-----------+----------+--------------+ CFV      Full                    Yes                                 +---------+---------------+---------+-----------+----------+--------------+ SFJ      Full                                                        +---------+---------------+---------+-----------+----------+--------------+ FV Prox  Full                                                        +---------+---------------+---------+-----------+----------+--------------+ FV Mid   Full                                                        +---------+---------------+---------+-----------+----------+--------------+ FV DistalFull                                                        +---------+---------------+---------+-----------+----------+--------------+  PFV      Full                                                        +---------+---------------+---------+-----------+----------+--------------+ POP      Full           Yes      Yes                                 +---------+---------------+---------+-----------+----------+--------------+ PTV      Full                                                        +---------+---------------+---------+-----------+----------+--------------+ PERO     Full                                                        +---------+---------------+---------+-----------+----------+--------------+    Summary: BILATERAL: - No evidence of deep vein thrombosis seen in the lower extremities, bilaterally. -No evidence of popliteal cyst, bilaterally.   *See table(s) above for measurements and observations.    Preliminary     Recent Labs    04/18/23 0554 04/19/23 1037  WBC 13.1* 13.8*  HGB 10.0* 10.3*  HCT 30.1* 31.4*  PLT 662* 645*   Recent Labs    04/18/23 0554 04/19/23 0512  NA 130* 131*  K 3.6 4.0  CL 93* 94*   CO2 27 28  GLUCOSE 122* 111*  BUN 37* 36*  CREATININE 2.52* 2.30*  CALCIUM 7.7* 8.0*    Intake/Output Summary (Last 24 hours) at 04/20/2023 0745 Last data filed at 04/19/2023 1800 Gross per 24 hour  Intake 250 ml  Output 220 ml  Net 30 ml      Pressure Injury 04/08/23 Ankle Posterior;Right Stage 2 -  Partial thickness loss of dermis presenting as a shallow open injury with a red, pink wound bed without slough. unattached edges scant drainage. This LDA replaces one from 03/03/23 that was entered (Active)  04/08/23 1730  Location: Ankle  Location Orientation: Posterior;Right  Staging: Stage 2 -  Partial thickness loss of dermis presenting as a shallow open injury with a red, pink wound bed without slough.  Wound Description (Comments): unattached edges scant drainage. This LDA replaces one from 03/03/23 that was entered inaccurately as a blister that had opened.  Present on Admission: Yes     Pressure Injury 04/14/23 Heel Left;Lateral Deep Tissue Pressure Injury - Purple or maroon localized area of discolored intact skin or blood-filled blister due to damage of underlying soft tissue from pressure and/or shear. (Active)  04/14/23 1010  Location: Heel  Location Orientation: Left;Lateral  Staging: Deep Tissue Pressure Injury - Purple or maroon localized area of discolored intact skin or blood-filled blister due to damage of underlying soft tissue from pressure and/or shear.  Wound Description (Comments):   Present on Admission: No    Physical Exam: Vital Signs Blood pressure 138/78, pulse (S) (!)  124, temperature 97.7 F (36.5 C), temperature source Oral, resp. rate (!) 21, height 6\' 2"  (1.88 m), weight 72.1 kg, SpO2 99%.   PE:  General: No acute distress Mood and affect are appropriate Heart: Irreg irreg, no murmurs, +tachy  Lungs: diminished breath sounds at bases  breathing labored, no rales or wheezes Abdomen: Positive bowel sounds, soft nontender to palpation,  nondistended Extremities: No clubbing, cyanosis, or edema, right thigh swelling  Skin: No evidence of breakdown, no evidence of rash Neurologic:motor strength is 4/5 in bilateral deltoid, bicep, tricep, grip, hip flexor, knee extensors, ankle dorsiflexor and plantar flexor  Musculoskeletal: Full range of motion in all 4 extremities. No joint swelling    Assessment/Plan: 1. Functional deficits which require 3+ hours per day of interdisciplinary therapy in a comprehensive inpatient rehab setting. Physiatrist is providing close team supervision and 24 hour management of active medical problems listed below. Physiatrist and rehab team continue to assess barriers to discharge/monitor patient progress toward functional and medical goals  Care Tool:  Bathing    Body parts bathed by patient: Right arm, Left arm, Chest, Face, Abdomen   Body parts bathed by helper: Front perineal area, Right lower leg, Left upper leg, Right upper leg, Buttocks, Left lower leg     Bathing assist Assist Level: Total Assistance - Patient < 25%     Upper Body Dressing/Undressing Upper body dressing   What is the patient wearing?: Pull over shirt    Upper body assist Assist Level: Contact Guard/Touching assist    Lower Body Dressing/Undressing Lower body dressing      What is the patient wearing?: Pants     Lower body assist Assist for lower body dressing: Total Assistance - Patient < 25%     Toileting Toileting    Toileting assist Assist for toileting: Maximal Assistance - Patient 25 - 49%     Transfers Chair/bed transfer  Transfers assist  Chair/bed transfer activity did not occur: Safety/medical concerns (unsafe to get up)  Chair/bed transfer assist level: Minimal Assistance - Patient > 75%     Locomotion Ambulation   Ambulation assist   Ambulation activity did not occur: Safety/medical concerns (amb 8' in //bars with min assist and +2 for WC follow)  Assist level: 2  helpers Assistive device: Walker-rolling Max distance: 33'   Walk 10 feet activity   Assist  Walk 10 feet activity did not occur: Safety/medical concerns  Assist level: 2 helpers Assistive device: Walker-rolling   Walk 50 feet activity   Assist Walk 50 feet with 2 turns activity did not occur: Safety/medical concerns         Walk 150 feet activity   Assist Walk 150 feet activity did not occur: Safety/medical concerns         Walk 10 feet on uneven surface  activity   Assist Walk 10 feet on uneven surfaces activity did not occur: Safety/medical concerns         Wheelchair     Assist Is the patient using a wheelchair?: Yes Type of Wheelchair: Manual    Wheelchair assist level: Contact Guard/Touching assist Max wheelchair distance: 59'    Wheelchair 50 feet with 2 turns activity    Assist        Assist Level: Contact Guard/Touching assist   Wheelchair 150 feet activity     Assist      Assist Level: Moderate Assistance - Patient 50 - 74%   Blood pressure 138/78, pulse (S) (!) 124, temperature 97.7 F (  36.5 C), temperature source Oral, resp. rate (!) 21, height 6\' 2"  (1.88 m), weight 72.1 kg, SpO2 99%.  Medical Problem List and Plan: 1. Functional deficits secondary to retroperitoneal hematoma after recent fall.             -also right right AC jt injury, ?clavicle fx             -patient may  shower  -Stable to continue CIR             -ELOS/Goals: 10-12 days, supervision goals with PT, OT -  DC date goal extended to 12/30   - 12/10: Family installing chair lift. Has HH in place. Functionally limited by RLE pain.  - 12/17: Limited by orthostasis per therapies, and decreased insight. CGA LBD. Hasn't made much progress with PT. Walker 22 feet Min A with RW yesterday. RLE pain limiting but improving. Self-limiting.  CGA ambulatory goals. May need WC at discharge.  12/20: On medical hold for symptomatic tachycardia today.  Improved this  afternoon, will DC hold for tomorrow.   2.  Antithrombotics: -DVT/anticoagulation:  Pharmaceutical: Eliquis 5mg  BID             -antiplatelet therapy: none             -pt with new right lower extremity edema and thigh pain. Dopplers pending                         -eliquis recently resumed, so findings won't change medical mgt 3. Pain Management: Tylenol, oxycodone as needed for hip  -right thigh pain. ?muscle strain. No sensory findings. Most recent CT reviewed.  It's possible he's had further migration of blood further down into the thigh as there was blood along the distal iliopsoas on that CT                  4. Mood/Behavior/Sleep: LCSW to evaluate and provide emotional support             -continue melatonin 10mg  (ordered as 5mg  PRN though?)             -antipsychotic agents: n/a -anxious poor sleep r/t SOB   5. Neuropsych/cognition: This patient is capable of making decisions on his own behalf. - 12/10: SLP consult for reported cognitive deficits; may be medication induced per wife 12-18: Per teams, cognitive baseline.  6. Skin/Wound Care: Routine skin care checks   - Stage 2 to sacrum reported-now unstageable 12-11: Unstageable found on right heel.  Added Medihoney to heel and low back, changed to low-air-loss mattress - WOCN consulted 12/11 for recs   - Lateral trunk rash managed with steroid creams essentially resolved   -12-13: Left antecubital IV infiltration.  Ice and compression to area.  Resolved  7. Fluids/Electrolytes/Nutrition/hyponatremia: Strict Is and Os and follow-up chemistries   - 12/10: Fluid restriction for mild hyponatremia today.  Repeat tomorrow a.m. - 12/11: Na 129 today; was not compliant with fluid restriction yesterday. Continue today and repeat in AM tomorrow; if no improvement will need to start gentle IVF. Add 40 meq daily potassium.  12-12: NA 131, improved.  Potassium normalized.  DC supplementation of potassium given all priors were normal on same  dose of torsemide.  - 04/13/23: NA 130, BUN/creatinine stable, same as yesterday.  Monitor - 12-19: NA 130, BUN/creatinine slightly increased.  Start Os-Cal 1 tab twice daily for hypocalcemia less than 8.  Liberalizing fluids per patient request; repeat BMP  in AM with magnesium, Phos, and vitamin D. 12/20: NA 131 on liberalized fluids.  Phosphorus mildly high 5.0, otherwise labs appear stable.  Vitamin D is low/normal, calcium improving on supplementation.  8: Hypertension: monitor TID and prn (Entresto, torsemide held)>> still on torsemide 20mg  daily             -currently on midodrine 10 mg TID for hypotension -12-12: Slightly hypotensive, on fluid restriction with diuresis, would continue midodrine for now.  Questionable orthostatic episode today, symptoms more vertiginous, treatment as below.  12-13: BP improved, no further symptoms orthostasis, monitor  -12/14-15/24 BPs great, monitor  12-16: Decrease midodrine to 5 mg twice daily.  Wean gradually over the next few days if tolerated.  Has TED hose.  12/17: increase midodrine back to 7.5 twice daily due to symptomatic orthostasis with therapies; question more due to deconditioning due to tendency to happen at end of ambulation.  12/18: Normotensive.   12/19: Blood pressure stable, but tachycardic with orthostatic vitals into the 120s.  12/20: Blood pressure remaining stable, primary issue is tachycardia.  Reduce midodrine to 2.5 mg 3 times daily, could DC over this weekend if BP holds   Vitals:   04/19/23 1430 04/19/23 1621 04/19/23 1825 04/19/23 2016  BP: 125/71 128/79 123/64 136/73   04/19/23 2055 04/19/23 2231 04/19/23 2300 04/20/23 0110  BP: (!) 141/79 139/86 119/86 (!) 133/90   04/20/23 0200 04/20/23 0300 04/20/23 0459 04/20/23 0700  BP: 121/65 122/83 133/79 138/78     9:  Cardiorenal syndrome/acute kidney Injury on chronic kidney disease stage IIIb (baseline creatinine around 2)              -milrinone discontinued 12/08              -no indication for HD             -daily weight             -continue sodium bicarbonate 650 mg BID             -continue torsemide 20 mg daily  12/10-14: Renal function appears stable.   -04/14/23 Cr up to 2.25 today, baseline is 2 so will just encourage PO intake for now, but monitor closely 12-16: Creatinine down to 2.0.  Back to baseline. 12/19: Creatinine back up to 2.5, liberalizing fluids as above and repeat in a.m. 12/20: Creatinine improved.  NA stable and liberalize fluids. 12/21-    10: Fall/right retroperitoneal hematoma: now back on Eliquis; H and H stable             -follow-up CBC - stable   11: Atypical atrial flutter; hx of pAF: rate controlled (low 100s)             -on Eliquis 5mg  BID, amiodarone 200mg  daily  -Heart rate stable, does have some variation into the low 100s to 120s intermittently.  12-20: Tachycardia today 110-120s, 12/21 tachycardia 124 bpm repeat EKG    12: COPD: former tobacco smoker, quit 2018   13: DM-2: CBGs QID; A1c = 6.9% (home on Ozempic; metformin discontinued due to CKD)             -continue SSI  Blood sugars have remained relatively stable -04/13/23 CBGs variable, many >200, would probably benefit from some optimization, but defer to weekday team to discuss options -04/14/23 CBGs a bit better but still might benefit from further optimization 12-17: BG is well-controlled.  Monitor. Recent Labs    04/19/23 1619 04/19/23 2021  04/20/23 0554  GLUCAP 151* 169* 139*       14: CAD: s/p 2-vessel CABG 2018             -restart statin once LFTs normalized             -follow-up CMP Mon 12/16  12-17: LFTs improved, resume prior Lipitor 80 mg daily  Repeat CMP 12-19-LFTs continuing to normalize   15: Acute on chronic systolic and diastolic heart failure, hx of stage D systolic heart failure post CCM             -continue amiodarone 200 mg daily             -continue torsemide 20 mg daily 12-11: Fluid restriction for hyponatremia as  above.  I's and O's remain significantly positive.  Added TED hose for bilateral lower extremity edema; weights remain stable, no symptoms of volume overload. 12/12-17: Weights stable.  Can't tolerate TEDs, advised we could ACE wrap his RLE to help with the swelling; pt will consider it - done 12/17 12-18: Weight significantly down, likely an error, edema around thigh appears approximately the same.  CMP tomorrow a.m. 12-19: No weights overnight.  Clinically volume stable. 12/20: Weight stable.  New shortness of breath, peripheral edema, and chest x-ray with increased bilateral pleural effusions.  Giving Lasix 20 mg as needed x 1. Filed Weights   04/15/23 0629 04/17/23 0608 04/19/23 1023  Weight: 89.6 kg 75 kg 72.1 kg  12/21 weight is down but still has SOB, increased cardiomegaly with increased interstitial markings , repeat IV lasix increase to 40mg  , pt unable to perform rehab x >2 Spoke to Dr Tenny Craw from cardiology who will eval  Also contacting IM hospitalist, per cardiology will need monitored bed   back in A flutter on last EKG 16: Elevated transaminases, up trending: ? due to congestive hepatopathy             -follow-up CMP-downtrending, but persistent, f/up Monday   17: Multiple falls; AC joint injury/possible right distal clavicular fracture 11/05   18: Possible pneumonia: continue Augmentin to finish the course   - 12/10:  Repeat chest x-ray without consolidation  - 12/11: Augmentin stopped per ID recommendations  19: Leukocytosis, chronic: negative infectious work-up except possible PNA; had short course steroids for skin rash             -appears to be off prednisone now             -on Augmentin (end 12/11)             -follow-up CBC with diff on 12/10 12/10: Uptrending leukocytosis 28.3 -> 37.8 -> 43.7 despite stopping p.o. steroids on 12-7.  Extensive inpatient workup for leukocytosis described in H&P.  Does have chronic underlying leukocytosis in the 20s.  Will consult  infectious disease for additional workup/ideas today, feel less likely reactive given that thrombocytosis is improving and other parameters remained stable.  12/11: Mildly decreased to 35 today. Remains afebrile. ID consulted yesterday, recommending DC augmentin, daily monitorring, and drainage of psoas fluid collection if no improvement. Appreciate their insight and recommendations. 12-12: WBC back up to 40, thrombocytosis mildly uptrending.  Afebrile, per infectious disease aspiration of right hip fluid performed and cultures pending.  Appreciate their recommendations and management. 12-13: 300 cc hip aspirate with multiple WBCs and PMNs.  Patient started on vancomycin per infectious disease.  Monitor for final culture results.--Lactobacillus contaminant 12-16: Culture grew contaminant as above.  WBC is  downtrending.  Stop vancomycin, repeat CBC every 72 hours and reconsult ID as needed.  Very much appreciate their assistance in the management of this patient. Repeat CBC 12-19-WBC continues to downtrend.  Stable Hgb Leukocytosis trending down  20: Lung nodule: follow-up outpatient with PCP   21: Hx of bladder cancer s/p TURP 2013   22: Skin rash treated with prednisone/hydroxyzine/calamine lotion             -appears prednisone has been discontinued-rash essentially resolved.   23: Chronic anemia: H and H stable times 72 hours             -follow-up CBC-stable 12/14   24: Thrombocytosis; fluctuating: follow-up CBC.  Ongoing..   25. Vertigo/dizziness.  Started 12-12, no nystagmus on exam. -orthostatic vitals every morning -Added as needed meclizine 12.5 mg twice daily-discontinued due to no use 12-18 -Possible symptoms are secondary to medication burden, would wean down pain medications as tolerated -12-13: Improved, no recurrent symptoms endorsed 12-17: Increasing midodrine as above.  Seems more due to deconditioning, monitor. 12-19: Patient reports only in a.m., seems more related to  tachycardia than blood pressure on orthostatic vitals.  Liberalizing fluids as above, keep torsemide at same dose for now.  If no improvement in symptoms, will need cardiology consult to help with medication adjustment 12-20: See above.  Some signs of volume overload today with tachycardia.  26.  Constipation.  Increase sorbitol to 2 tabs twice daily, add daily MiraLAX. -Last bowel movement 12-18 with sorbitol   LOS: 12 days A FACE TO FACE EVALUATION WAS PERFORMED  Erick Colace 04/20/2023, 7:45 AM

## 2023-04-21 DIAGNOSIS — I5043 Acute on chronic combined systolic (congestive) and diastolic (congestive) heart failure: Secondary | ICD-10-CM

## 2023-04-21 LAB — BASIC METABOLIC PANEL
Anion gap: 13 (ref 5–15)
BUN: 46 mg/dL — ABNORMAL HIGH (ref 8–23)
CO2: 25 mmol/L (ref 22–32)
Calcium: 8.1 mg/dL — ABNORMAL LOW (ref 8.9–10.3)
Chloride: 91 mmol/L — ABNORMAL LOW (ref 98–111)
Creatinine, Ser: 2.56 mg/dL — ABNORMAL HIGH (ref 0.61–1.24)
GFR, Estimated: 25 mL/min — ABNORMAL LOW (ref >60)
Glucose, Bld: 153 mg/dL — ABNORMAL HIGH (ref 70–99)
Potassium: 3.8 mmol/L (ref 3.5–5.1)
Sodium: 129 mmol/L — ABNORMAL LOW (ref 135–145)

## 2023-04-21 LAB — GLUCOSE, CAPILLARY
Glucose-Capillary: 149 mg/dL — ABNORMAL HIGH (ref 70–99)
Glucose-Capillary: 193 mg/dL — ABNORMAL HIGH (ref 70–99)
Glucose-Capillary: 255 mg/dL — ABNORMAL HIGH (ref 70–99)
Glucose-Capillary: 179 mg/dL — ABNORMAL HIGH (ref 70–99)

## 2023-04-21 LAB — CBC
HCT: 29.8 % — ABNORMAL LOW (ref 39.0–52.0)
Hemoglobin: 10 g/dL — ABNORMAL LOW (ref 13.0–17.0)
MCH: 26.9 pg (ref 26.0–34.0)
MCHC: 33.6 g/dL (ref 30.0–36.0)
MCV: 80.1 fL (ref 80.0–100.0)
Platelets: 720 10*3/uL — ABNORMAL HIGH (ref 150–400)
RBC: 3.72 MIL/uL — ABNORMAL LOW (ref 4.22–5.81)
RDW: 20 % — ABNORMAL HIGH (ref 11.5–15.5)
WBC: 15.4 10*3/uL — ABNORMAL HIGH (ref 4.0–10.5)
nRBC: 0 % (ref 0.0–0.2)

## 2023-04-21 LAB — PROCALCITONIN: Procalcitonin: 0.7 ng/mL

## 2023-04-21 LAB — BRAIN NATRIURETIC PEPTIDE: B Natriuretic Peptide: 1292 pg/mL — ABNORMAL HIGH (ref 0.0–100.0)

## 2023-04-21 LAB — MAGNESIUM: Magnesium: 2 mg/dL (ref 1.7–2.4)

## 2023-04-21 LAB — TSH: TSH: 3.735 u[IU]/mL (ref 0.350–4.500)

## 2023-04-21 MED ORDER — MAGNESIUM HYDROXIDE 400 MG/5ML PO SUSP: 30.0000 mL | Freq: Every day | ORAL | Status: DC | PRN

## 2023-04-21 MED ORDER — AMIODARONE IV BOLUS ONLY 150 MG/100ML
150.0000 mg | Freq: Once | INTRAVENOUS | Status: AC
  Administered 2023-04-21: 150 mg via INTRAVENOUS

## 2023-04-21 MED ORDER — MILRINONE LACTATE IN DEXTROSE 20-5 MG/100ML-% IV SOLN: 0.1250 ug/kg/min | INTRAVENOUS | Status: DC

## 2023-04-21 MED ORDER — POTASSIUM CHLORIDE CRYS ER 20 MEQ PO TBCR
20.0000 meq | EXTENDED_RELEASE_TABLET | Freq: Once | ORAL | Status: AC
  Administered 2023-04-21: 20 meq via ORAL
  Filled 2023-04-21: qty 1

## 2023-04-21 NOTE — Progress Notes (Signed)
PROGRESS NOTE    [name] [name]  JYN:[id number] DOB: [birth year] DOA: 04/20/2023 PCP: [name], MD   77 y.o. male with COPD, CAD, CABG 2018, ischemic cardiomyopathy EF 20-25%, paroxysmal A-fib, hypertension, type 2 diabetes mellitus, history of LV thrombus.   -Admitted 11/4 w/syncope fall, Afib>cardioverted 11/7, discharged 11/8 -Readmitted 11/25 with increased right hip pain, imaging noted retroperitoneal hematoma extending to distal psoas and iliacus and also volume overloaded, diuresed, followed by nephrology and cards then, started on midodrine, discharged to rehab 12/9 on torsemide -In rehab he had worsening leukocytosis up to 43K, infectious disease consulted 12/10, on 12/12 underwent ultrasound-guided aspiration of right hip, 30 cc of turbid brownish fluid drained, Cx w LACTICASEIBACILLUS CASEI, he was treated with few days of IV vancomycin which does not have efficacy against this, leukocytosis largely spontaneously started improving, vancomycin discontinued 12/16.  Last 2 days had increased shortness of breath, elevated heart rate etc.  -12/21: Readmitted from CIR back to inpatient, 2H with A-fib RVR and CHF -12/21, heart failure team consulted, started on milrinone, Lasix dose increased  Subjective: -Feels better today, breathing improving  Assessment and Plan:  Acute on chronic systolic CHF, ischemic cardiomyopathy -Last echo 11/24 with EF 20-25%, mildly reduced RV -volume overloaded, compounded by severe hypoalbuminemia and third spacing -CHF team following, continue milrinone and high-dose Lasix, urine output poor yesterday -On midodrine -Add GDMT slowly if blood pressure tolerates   Atrial fibrillation RVR -On amiodarone, prior cardioversions -Continue IV amiodarone -Per cardiology, plan for cardioversion possibly tomorrow -Anticoagulation resumed -on apixaban   Retroperitoneal hematoma extending to distal psoas and iliacus -Following mechanical fall -Had  profound leukocytosis around 40K ,12/12 underwent ultrasound-guided aspiration of right hip, 30 cc of turbid brownish fluid drained, Cx w LACTICASEIBACILLUS CASEI, he was treated with few days of IV vancomycin which has no activity against this, leukocytosis largely spontaneously started improving, vancomycin discontinued 12/16, ID signed off -Mild leukocytosis and thrombocytosis persists, afebrile and nontoxic, no abdominal symptoms, monitor   Leukocytosis -See discussion above   CAD  Hx CABG 2018 -Stable   AKI, CKD 3B/4 -Baseline creatinine around 2-2.2 -Was trending up, now improving on milrinone   Chronic anemia -Relatively stable   Type 2 diabetes mellitus -Recent A1c was 6.9 -On Ozempic at baseline, off metformin now -CBGs are stable, continue SSI, if starts trending up will add long-acting insulin   Severe hypoalbuminemia -No clear history of cirrhosis -?  Malnutrition alone versus combination with chronic liver disease -Dietitian consult   GOALS: 77/M with severe cardiomyopathy, A-fib RVR, severe hypoalbuminemia and third spacing, retroperitoneal hematoma, I think his prognosis is poor, discussed CODE STATUS with patient and wife, he wishes to be a full code with full scope of Rx     DVT prophylaxis: Apixaban Code Status: Full code Family Communication: Wife at bedside Disposition Plan:   Consultants:    Procedures:   Antimicrobials:    Objective: Vitals:   04/21/23 0600 04/21/23 0700 04/21/23 0800 04/21/23 0900  BP: 137/60 123/60 131/67 118/77  Pulse:      Resp: 15 17 18 20   Temp:  97.9 F (36.6 C)    TempSrc:  Oral    SpO2: 94% 92% 92% 91%  Weight:      Height:        Intake/Output Summary (Last 24 hours) at 04/21/2023 0956 Last data filed at 04/21/2023 0900 Gross per 24 hour  Intake 810.59 ml  Output 848 ml  Net -37.41 ml   [employer]  04/20/23 1315 04/21/23 0519  Weight: 72.1 kg 91 kg    Examination:  General exam: Appears calm  and comfortable AAOx3 HEENT: Positive JVD Respiratory system: Decreased breath sounds at the bases Cardiovascular system: S1 & S2 heard, irregular rhythm Abd: nondistended, soft and nontender.Normal bowel sounds heard. Central nervous system: Alert and oriented. No focal neurological deficits. Extremities: 1-2+ edema, extending to upper thigh Skin: No rashes Psychiatry:  Mood & affect appropriate.     Data Reviewed:   CBC: Recent Labs  Lab 04/15/23 0535 04/18/23 0554 04/19/23 1037 04/20/23 0847 04/21/23 0921  WBC 19.5* 13.1* 13.8* 16.0* 15.4*  NEUTROABS  --  11.3* 12.0*  --   --   HGB 10.2* 10.0* 10.3* 10.5* 10.0*  HCT 31.0* 30.1* 31.4* 32.3* 29.8*  MCV 79.3* 80.1 80.7 81.0 80.1  PLT 640* 662* 645* 662* 720*   Basic Metabolic Panel: Recent Labs  Lab 04/18/23 0554 04/19/23 0512 04/20/23 0847 04/20/23 2119 04/21/23 0921  NA 130* 131* 129* 130* 129*  K 3.6 4.0 4.3 4.2 3.8  CL 93* 94* 90* 92* 91*  CO2 27 28 26 25 25   GLUCOSE 122* 111* 166* 173* 153*  BUN 37* 36* 42* 44* 46*  CREATININE 2.52* 2.30* 2.46* 2.75* 2.56*  CALCIUM 7.7* 8.0* 8.2* 8.0* 8.1*  MG  --  1.9  --   --  2.0  PHOS  --  5.0*  --   --   --    GFR: Estimated Creatinine Clearance: 28.1 mL/min (A) (by C-G formula based on SCr of 2.56 mg/dL (H)). Liver Function Tests: Recent Labs  Lab 04/15/23 0535 04/18/23 0554 04/20/23 0847  AST 61* 46* 70*  ALT 58* 41 45*  ALKPHOS 116 112 156*  BILITOT 1.4* 1.2* 1.3*  PROT 4.9* 4.7* 5.7*  ALBUMIN 1.7* 1.7* 2.0*   No results for input(s): "LIPASE", "AMYLASE" in the last 168 hours. No results for input(s): "AMMONIA" in the last 168 hours. Coagulation Profile: No results for input(s): "INR", "PROTIME" in the last 168 hours. Cardiac Enzymes: No results for input(s): "CKTOTAL", "CKMB", "CKMBINDEX", "TROPONINI" in the last 168 hours. BNP (last 3 results) Recent Labs    09/25/22 0953  PROBNP 2,187*   HbA1C: No results for input(s): "HGBA1C" in the last 72  hours. CBG: Recent Labs  Lab 04/20/23 0554 04/20/23 1201 04/20/23 1615 04/20/23 2123 04/21/23 0645  GLUCAP 139* 180* 132* 165* 149*   Lipid Profile: No results for input(s): "CHOL", "HDL", "LDLCALC", "TRIG", "CHOLHDL", "LDLDIRECT" in the last 72 hours. Thyroid Function Tests: No results for input(s): "TSH", "T4TOTAL", "FREET4", "T3FREE", "THYROIDAB" in the last 72 hours. Anemia Panel: No results for input(s): "VITAMINB12", "FOLATE", "FERRITIN", "TIBC", "IRON", "RETICCTPCT" in the last 72 hours. Urine analysis:    Component Value Date/Time   COLORURINE YELLOW 04/07/2023 1810   APPEARANCEUR CLEAR 04/07/2023 1810   LABSPEC 1.009 04/07/2023 1810   PHURINE 5.0 04/07/2023 1810   GLUCOSEU NEGATIVE 04/07/2023 1810   HGBUR NEGATIVE 04/07/2023 1810   BILIRUBINUR NEGATIVE 04/07/2023 1810   KETONESUR NEGATIVE 04/07/2023 1810   PROTEINUR NEGATIVE 04/07/2023 1810   NITRITE NEGATIVE 04/07/2023 1810   LEUKOCYTESUR NEGATIVE 04/07/2023 1810   Sepsis Labs: @LABRCNTIP (procalcitonin:4,lacticidven:4)  ) Recent Results (from the past 240 hours)  Aerobic/Anaerobic Culture w Gram Stain (surgical/deep wound)     Status: Abnormal   Collection Time: 04/11/23  1:39 PM   Specimen: Abscess  Result Value Ref Range Status   Specimen Description ABSCESS  Final   Special Requests right hip  Final   Gram Stain   Final    ABUNDANT WBC PRESENT, PREDOMINANTLY PMN FEW GRAM POSITIVE RODS    Culture (A)  Final    LACTICASEIBACILLUS CASEI Standardized susceptibility testing for this organism is not available. NO ANAEROBES ISOLATED Performed at [facility], [address], [place], [place] [address]    Report Status 04/16/2023 FINAL  Final  MRSA Next Gen by PCR, Nasal     Status: None   Collection Time: 04/20/23 10:29 AM   Specimen: Nasal Mucosa; Nasal Swab  Result Value Ref Range Status   MRSA by PCR Next Gen NOT DETECTED NOT DETECTED Final    Comment: (NOTE) The GeneXpert MRSA Assay (FDA  approved for NASAL specimens only), is one component of a comprehensive MRSA colonization surveillance program. It is not intended to diagnose MRSA infection nor to guide or monitor treatment for MRSA infections. Test performance is not FDA approved in patients less than 48 years old. Performed at [facility], [address], [place], [place] [address]      Radiology Studies: [place] EKG SITE RITE Result Date: 04/20/2023 If [facility] image not attached, placement could not be confirmed due to current cardiac rhythm.  VAS [place] LOWER EXTREMITY VENOUS (DVT) Result Date: 04/20/2023  Lower Venous DVT Study Patient Name:  DEMETRIK LATTANZI  Date of Exam:   04/19/2023 Medical Rec #: [id number]         Accession #:    [id number] Date of Birth: Jan 08, 1946         Patient Gender: M Patient Age:   53 years Exam Location:  [facility] Procedure:      VAS [place] LOWER EXTREMITY VENOUS (DVT) Referring Phys: Elijah Birk --------------------------------------------------------------------------------  Indications: Swelling, Pain, and right calf. Other Indications: Recent fall after syncopal episode on 11/4. Comparison Study: 04/08/23 -Negative Performing Technologist: Marilynne Halsted RDMS, RVT  Examination Guidelines: A complete evaluation includes B-mode imaging, spectral Doppler, color Doppler, and power Doppler as needed of all accessible portions of each vessel. Bilateral testing is considered an integral part of a complete examination. Limited examinations for reoccurring indications may be performed as noted. The reflux portion of the exam is performed with the patient in reverse Trendelenburg.  +---------+---------------+---------+-----------+----------+--------------+ RIGHT    CompressibilityPhasicitySpontaneityPropertiesThrombus Aging +---------+---------------+---------+-----------+----------+--------------+ CFV      Full           Yes      Yes                                  +---------+---------------+---------+-----------+----------+--------------+ SFJ      Full                                                        +---------+---------------+---------+-----------+----------+--------------+ FV Prox  Full                                                        +---------+---------------+---------+-----------+----------+--------------+ FV Mid   Full                                                        +---------+---------------+---------+-----------+----------+--------------+  FV DistalFull                                                        +---------+---------------+---------+-----------+----------+--------------+ PFV      Full                                                        +---------+---------------+---------+-----------+----------+--------------+ POP      Full           Yes      Yes                                 +---------+---------------+---------+-----------+----------+--------------+ PTV      Full                                                        +---------+---------------+---------+-----------+----------+--------------+ PERO     Full                                                        +---------+---------------+---------+-----------+----------+--------------+   +---------+---------------+---------+-----------+----------+--------------+ LEFT     CompressibilityPhasicitySpontaneityPropertiesThrombus Aging +---------+---------------+---------+-----------+----------+--------------+ CFV      Full                    Yes                                 +---------+---------------+---------+-----------+----------+--------------+ SFJ      Full                                                        +---------+---------------+---------+-----------+----------+--------------+ FV Prox  Full                                                         +---------+---------------+---------+-----------+----------+--------------+ FV Mid   Full                                                        +---------+---------------+---------+-----------+----------+--------------+ FV DistalFull                                                        +---------+---------------+---------+-----------+----------+--------------+  PFV      Full                                                        +---------+---------------+---------+-----------+----------+--------------+ POP      Full           Yes      Yes                                 +---------+---------------+---------+-----------+----------+--------------+ PTV      Full                                                        +---------+---------------+---------+-----------+----------+--------------+ PERO     Full                                                        +---------+---------------+---------+-----------+----------+--------------+     Summary: BILATERAL: - No evidence of deep vein thrombosis seen in the lower extremities, bilaterally. -No evidence of popliteal cyst, bilaterally.   *See table(s) above for measurements and observations. Electronically signed by Coral Else MD on 04/20/2023 at 10:03:45 AM.    Final    DG Chest Port 1 View Result Date: 04/19/2023 CLINICAL DATA:  Shortness of breath for a day EXAM: PORTABLE CHEST 1 VIEW COMPARISON:  X-ray 04/09/2023.  Older exams as well. FINDINGS: Sternal wires. Enlarged cardiopericardial silhouette with a calcified aorta. Bilateral upper chest battery packs with leads along the right side of the heart. Please correlate with history. Increasing small bilateral pleural effusions with some adjacent opacity. Mild interstitial prominence. No pneumothorax. IMPRESSION: Postop chest with enlarged heart. 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 Increasing small bilateral pleural effusions with some vascular  congestion and trace interstitial prominence. Recommend follow up Electronically Signed   By: Karen Kays M.D.   On: 04/19/2023 12:21     Scheduled Meds:  acetaminophen  1,000 mg Oral TID   apixaban  5 mg Oral BID   atorvastatin  80 mg Oral Daily   calcium-vitamin D  1 tablet Oral BID WC   Chlorhexidine Gluconate Cloth  6 each Topical Daily   Gerhardt's butt cream   Topical BID   insulin aspart  0-15 Units Subcutaneous TID WC   insulin aspart  0-5 Units Subcutaneous QHS   leptospermum manuka honey  1 Application Topical Daily   midodrine  5 mg Oral TID WC   polyethylene glycol  17 g Oral Daily   senna-docusate  2 tablet Oral BID   sodium bicarbonate  650 mg Oral BID   Continuous Infusions:  amiodarone 60 mg/hr (04/21/23 0932)   furosemide Stopped (04/21/23 0841)   milrinone 0.125 mcg/kg/min (04/21/23 0939)     LOS: 1 day    Time spent:    Zannie Cove, MD [facility]   04/21/2023, 9:56 AM

## 2023-04-21 NOTE — Anesthesia Preprocedure Evaluation (Signed)
Anesthesia Evaluation  Patient identified by MRN, date of birth, ID band Patient awake    Reviewed: Allergy & Precautions, NPO status , Patient's Chart, lab work & pertinent test results  History of Anesthesia Complications Negative for: history of anesthetic complications  Airway Mallampati: II  TM Distance: >3 FB Neck ROM: Full    Dental  (+) Teeth Intact, Dental Advisory Given   Pulmonary shortness of breath, neg sleep apnea, pneumonia, neg COPD, neg recent URI, former smoker    + decreased breath sounds      Cardiovascular hypertension, Pt. on medications + CAD, + Past MI, + CABG, + Peripheral Vascular Disease and +CHF  + Cardiac Defibrillator + Valvular Problems/Murmurs  Rhythm:Irregular     Neuro/Psych negative neurological ROS  negative psych ROS   GI/Hepatic negative GI ROS,,,(+) Hepatitis -  Endo/Other  diabetes, Type 2    Renal/GU CRFRenal disease     Musculoskeletal  (+) Arthritis ,    Abdominal   Peds  Hematology  (+) Blood dyscrasia, anemia   Anesthesia Other Findings  1. Global hypokinesis with apical akinesis. Left ventricular ejection  fraction, by estimation, is 25 to 30%. The left ventricle has severely  decreased function. The left ventricle demonstrates global hypokinesis.  The left ventricular internal cavity  size was mildly dilated. Left ventricular diastolic parameters are  consistent with Grade II diastolic dysfunction (pseudonormalization).   2. Right ventricular systolic function is mildly reduced. The right  ventricular size is normal. There is mildly elevated pulmonary artery  systolic pressure.   3. Left atrial size was mildly dilated.   4. The mitral valve is normal in structure. Trivial mitral valve  regurgitation. No evidence of mitral stenosis.   5. Left and right coronary cusps are partially fused. The aortic valve is  tricuspid. There is mild calcification of the aortic  valve. There is mild  thickening of the aortic valve. Aortic valve regurgitation is trivial. No  aortic stenosis is present.   6. The inferior vena cava is normal in size with greater than 50%  respiratory variability, suggesting right atrial pressure of 3 mmHg.    Reproductive/Obstetrics                             Anesthesia Physical Anesthesia Plan  ASA: 4  Anesthesia Plan: General   Post-op Pain Management: Minimal or no pain anticipated   Induction: Intravenous  PONV Risk Score and Plan: 2 and Propofol infusion and Treatment may vary due to age or medical condition  Airway Management Planned: Nasal Cannula, Natural Airway and Simple Face Mask  Additional Equipment: None  Intra-op Plan:   Post-operative Plan:   Informed Consent: I have reviewed the patients History and Physical, chart, labs and discussed the procedure including the risks, benefits and alternatives for the proposed anesthesia with the patient or authorized representative who has indicated his/her understanding and acceptance.     Dental advisory given  Plan Discussed with: CRNA and Anesthesiologist  Anesthesia Plan Comments:         Anesthesia Quick Evaluation

## 2023-04-21 NOTE — Progress Notes (Addendum)
Rounding Note    Patient Name: [name] [name] Date of Encounter: 04/21/2023   [facility] Cardiologist: Arvilla Meres, MD   Subjective   Breathing some better  Slept well  Inpatient Medications    Scheduled Meds:  acetaminophen  1,000 mg Oral TID   apixaban  5 mg Oral BID   atorvastatin  80 mg Oral Daily   calcium-vitamin D  1 tablet Oral BID WC   Chlorhexidine Gluconate Cloth  6 each Topical Daily   Gerhardt's butt cream   Topical BID   insulin aspart  0-15 Units Subcutaneous TID WC   insulin aspart  0-5 Units Subcutaneous QHS   leptospermum manuka honey  1 Application Topical Daily   midodrine  5 mg Oral TID WC   polyethylene glycol  17 g Oral Daily   senna-docusate  2 tablet Oral BID   sodium bicarbonate  650 mg Oral BID   Continuous Infusions:  amiodarone 30 mg/hr (04/21/23 0700)   furosemide Stopped (04/20/23 1441)   milrinone 0.25 mcg/kg/min (04/21/23 0700)   PRN Meds: methocarbamol, mouth rinse, oxyCODONE, zolpidem   Vital Signs    Vitals:   04/21/23 0500 04/21/23 0519 04/21/23 0600 04/21/23 0700  BP: 124/67  137/60 123/60  Pulse:      Resp: 15  15 17   Temp:    97.9 F (36.6 C)  TempSrc:    Oral  SpO2: 92%  94% 92%  Weight:  91 kg    Height:        Intake/Output Summary (Last 24 hours) at 04/21/2023 0714 Last data filed at 04/21/2023 0700 Gross per 24 hour  Intake 723.49 ml  Output 673 ml  Net 50.49 ml      04/21/2023    5:19 AM 04/20/2023    1:15 PM 04/19/2023   10:23 AM  Last 3 Weights  Weight (lbs) 200 lb 9.9 oz 158 lb 15.2 oz 158 lb 15.2 oz  Weight (kg) 91 kg 72.1 kg 72.1 kg      Telemetry    Afib  100s to 120 - Personally Reviewed  ECG    No new  - Personally Reviewed  Physical Exam   GEN: No acute distress.  On 2L  Neck: JVP increased Cardiac: Irrg irreg   II/VI systolic murmur      Respiratory: CTA GI: Soft, nontender, no hepatomegaly  Ext   1+ LE edema   Pt with bandages on Neuro:  Nonfocal  Psych:  Normal affect   Labs    High Sensitivity Troponin:   Recent Labs  Lab 04/20/23 0432  TROPONINIHS 33*     Chemistry Recent Labs  Lab 04/15/23 0535 04/18/23 0554 04/19/23 0512 04/20/23 0847 04/20/23 2119  NA 132* 130* 131* 129* 130*  K 4.1 3.6 4.0 4.3 4.2  CL 95* 93* 94* 90* 92*  CO2 26 27 28 26 25   GLUCOSE 142* 122* 111* 166* 173*  BUN 33* 37* 36* 42* 44*  CREATININE 2.00* 2.52* 2.30* 2.46* 2.75*  CALCIUM 7.9* 7.7* 8.0* 8.2* 8.0*  MG  --   --  1.9  --   --   PROT 4.9* 4.7*  --  5.7*  --   ALBUMIN 1.7* 1.7*  --  2.0*  --   AST 61* 46*  --  70*  --   ALT 58* 41  --  45*  --   ALKPHOS 116 112  --  156*  --   BILITOT 1.4* 1.2*  --  1.3*  --   GFRNONAA 34* 26* 29* 26* 23*  ANIONGAP 11 10 9 13 13     Lipids No results for input(s): "CHOL", "TRIG", "HDL", "LABVLDL", "LDLCALC", "CHOLHDL" in the last 168 hours.  Hematology Recent Labs  Lab 04/18/23 0554 04/19/23 1037 04/20/23 0847  WBC 13.1* 13.8* 16.0*  RBC 3.76* 3.89* 3.99*  HGB 10.0* 10.3* 10.5*  HCT 30.1* 31.4* 32.3*  MCV 80.1 80.7 81.0  MCH 26.6 26.5 26.3  MCHC 33.2 32.8 32.5  RDW 20.2* 20.5* 20.4*  PLT 662* 645* 662*   Thyroid No results for input(s): "TSH", "FREET4" in the last 168 hours.  BNP Recent Labs  Lab 04/20/23 0432 04/20/23 2119  BNP 2,698.9* 2,078.7*    DDimer No results for input(s): "DDIMER" in the last 168 hours.   Radiology    [place] EKG SITE RITE Result Date: 04/20/2023 If Site Rite image not attached, placement could not be confirmed due to current cardiac rhythm.  VAS [place] LOWER EXTREMITY VENOUS (DVT) Result Date: 04/20/2023  Lower Venous DVT Study Patient Name:  [name] [name]  Date of Exam:   04/19/2023 Medical Rec #: [id number]         Accession #:    [id number] Date of Birth: 10/07/1945         Patient Gender: M Patient Age:  77 years Exam Location:  [facility] Procedure:      VAS [place] LOWER EXTREMITY VENOUS (DVT) Referring Phys: Elijah Birk  --------------------------------------------------------------------------------  Indications: Swelling, Pain, and right calf. Other Indications: Recent fall after syncopal episode on 11/4. Comparison Study: 04/08/23 -Negative Performing Technologist: Marilynne Halsted RDMS, RVT  Examination Guidelines: A complete evaluation includes B-mode imaging, spectral Doppler, color Doppler, and power Doppler as needed of all accessible portions of each vessel. Bilateral testing is considered an integral part of a complete examination. Limited examinations for reoccurring indications may be performed as noted. The reflux portion of the exam is performed with the patient in reverse Trendelenburg.  +---------+---------------+---------+-----------+----------+--------------+ RIGHT    CompressibilityPhasicitySpontaneityPropertiesThrombus Aging +---------+---------------+---------+-----------+----------+--------------+ CFV      Full           Yes      Yes                                 +---------+---------------+---------+-----------+----------+--------------+ SFJ      Full                                                        +---------+---------------+---------+-----------+----------+--------------+ FV Prox  Full                                                        +---------+---------------+---------+-----------+----------+--------------+ FV Mid   Full                                                        +---------+---------------+---------+-----------+----------+--------------+ FV DistalFull                                                        +---------+---------------+---------+-----------+----------+--------------+  PFV      Full                                                        +---------+---------------+---------+-----------+----------+--------------+ POP      Full           Yes      Yes                                  +---------+---------------+---------+-----------+----------+--------------+ PTV      Full                                                        +---------+---------------+---------+-----------+----------+--------------+ PERO     Full                                                        +---------+---------------+---------+-----------+----------+--------------+   +---------+---------------+---------+-----------+----------+--------------+ LEFT     CompressibilityPhasicitySpontaneityPropertiesThrombus Aging +---------+---------------+---------+-----------+----------+--------------+ CFV      Full                    Yes                                 +---------+---------------+---------+-----------+----------+--------------+ SFJ      Full                                                        +---------+---------------+---------+-----------+----------+--------------+ FV Prox  Full                                                        +---------+---------------+---------+-----------+----------+--------------+ FV Mid   Full                                                        +---------+---------------+---------+-----------+----------+--------------+ FV DistalFull                                                        +---------+---------------+---------+-----------+----------+--------------+ PFV      Full                                                        +---------+---------------+---------+-----------+----------+--------------+  POP      Full           Yes      Yes                                 +---------+---------------+---------+-----------+----------+--------------+ PTV      Full                                                        +---------+---------------+---------+-----------+----------+--------------+ PERO     Full                                                         +---------+---------------+---------+-----------+----------+--------------+     Summary: BILATERAL: - No evidence of deep vein thrombosis seen in the lower extremities, bilaterally. -No evidence of popliteal cyst, bilaterally.   *See table(s) above for measurements and observations. Electronically signed by Coral Else MD on 04/20/2023 at 10:03:45 AM.    Final    DG Chest Port 1 View Result Date: 04/19/2023 CLINICAL DATA:  Shortness of breath for a day EXAM: PORTABLE CHEST 1 VIEW COMPARISON:  X-ray 04/09/2023.  Older exams as well. FINDINGS: Sternal wires. Enlarged cardiopericardial silhouette with a calcified aorta. Bilateral upper chest battery packs with leads along the right side of the heart. Please correlate with history. Increasing small bilateral pleural effusions with some adjacent opacity. Mild interstitial prominence. No pneumothorax. IMPRESSION: Postop chest with enlarged heart. 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 Increasing small bilateral pleural effusions with some vascular congestion and trace interstitial prominence. Recommend follow up Electronically Signed   By: Karen Kays M.D.   On: 04/19/2023 12:21    Cardiac Studies     Patient Profile      [name] [name] is a 77 y.o. male with hx of CAD, ICM and atrial flutter  who is being seen 04/20/2023 for the evaluation of worsening CHF .   Assessment & Plan    1  Atrial fibrillation / flutter   Pt remains in atrial fibrillation  By review of notes from rehab service probably went into this a few days ago    He has not tolerated this in past, with worsening CHF symptoms  Has had several cardioversions in past 1 year   Started on IV amiodarone yesterday (was on 200 mg daily)    Got a couple boluses and 60 mg /hour gtt    Rates in low 100s  Will give an additional bolus of amiodarone now  Review with CHF service  timing of cardioversion   ADDENDUM:  After talking to B Coldiron (CHF service) will continue IV  amiodarone today  There was a pause in Eliquis and he will be a 3 wks this week   (he probably has only been in afib for shorter time anyway) Plan for DCCV tomorrow with anesthesia support     2  HFrEF  Ischemic CM     Yesterday was in pulmonary edema with afib with RVR Lasix 80 then 160 mg given without a signficant response in UO, though he was breathing better and O2  needs have gone down    Milrinone started last night (no PICC due to devices bilaterally)   Unfort UO has not increased signficantly after next lasix dose given  Cr is 2.75 this am ( up from 2.46 yesterday  Note pt hs been continued on midodrine  3  CAD   CABG in 2018  4.  REnal   As above   Cr 2.75 this am  Baseline difficult to say but probably2 to 2.2   Has been labile     For questions or updates, please contact [facility] Please consult [email or web address] for contact info under        Signed, Dietrich Pates, MD  04/21/2023, 7:14 AM

## 2023-04-21 NOTE — Plan of Care (Signed)
  Problem: Clinical Measurements: Goal: Respiratory complications will improve Outcome: Progressing   Problem: Activity: Goal: Risk for activity intolerance will decrease Outcome: Not Progressing

## 2023-04-21 NOTE — Plan of Care (Signed)
  Problem: Clinical Measurements: Goal: Ability to maintain clinical measurements within normal limits will improve Outcome: Progressing   Problem: Nutrition: Goal: Adequate nutrition will be maintained Outcome: Not Progressing   

## 2023-04-21 NOTE — H&P (View-Only) (Signed)
Rounding Note    Patient Name: MANVILLE WOLAVER Date of Encounter: 04/21/2023   [facility] Cardiologist: Arvilla Meres, MD   Subjective   Breathing some better  Slept well  Inpatient Medications    Scheduled Meds:  acetaminophen  1,000 mg Oral TID   apixaban  5 mg Oral BID   atorvastatin  80 mg Oral Daily   calcium-vitamin D  1 tablet Oral BID WC   Chlorhexidine Gluconate Cloth  6 each Topical Daily   Gerhardt's butt cream   Topical BID   insulin aspart  0-15 Units Subcutaneous TID WC   insulin aspart  0-5 Units Subcutaneous QHS   leptospermum manuka honey  1 Application Topical Daily   midodrine  5 mg Oral TID WC   polyethylene glycol  17 g Oral Daily   senna-docusate  2 tablet Oral BID   sodium bicarbonate  650 mg Oral BID   Continuous Infusions:  amiodarone 30 mg/hr (04/21/23 0700)   furosemide Stopped (04/20/23 1441)   milrinone 0.25 mcg/kg/min (04/21/23 0700)   PRN Meds: methocarbamol, mouth rinse, oxyCODONE, zolpidem   Vital Signs    Vitals:   04/21/23 0500 04/21/23 0519 04/21/23 0600 04/21/23 0700  BP: 124/67  137/60 123/60  Pulse:      Resp: 15  15 17   Temp:    97.9 F (36.6 C)  TempSrc:    Oral  SpO2: 92%  94% 92%  Weight:  91 kg    Height:        Intake/Output Summary (Last 24 hours) at 04/21/2023 0714 Last data filed at 04/21/2023 0700 Gross per 24 hour  Intake 723.49 ml  Output 673 ml  Net 50.49 ml      04/21/2023    5:19 AM 04/20/2023    1:15 PM 04/19/2023   10:23 AM  Last 3 Weights  Weight (lbs) 200 lb 9.9 oz 158 lb 15.2 oz 158 lb 15.2 oz  Weight (kg) 91 kg 72.1 kg 72.1 kg      Telemetry    Afib  100s to 120 - Personally Reviewed  ECG    No new  - Personally Reviewed  Physical Exam   GEN: No acute distress.  On 2L  Neck: JVP increased Cardiac: Irrg irreg   II/VI systolic murmur      Respiratory: CTA GI: Soft, nontender, no hepatomegaly  Ext   1+ LE edema   Pt with bandages on Neuro:  Nonfocal  Psych:  Normal affect   Labs    High Sensitivity Troponin:   Recent Labs  Lab 04/20/23 0432  TROPONINIHS 33*     Chemistry Recent Labs  Lab 04/15/23 0535 04/18/23 0554 04/19/23 0512 04/20/23 0847 04/20/23 2119  NA 132* 130* 131* 129* 130*  K 4.1 3.6 4.0 4.3 4.2  CL 95* 93* 94* 90* 92*  CO2 26 27 28 26 25   GLUCOSE 142* 122* 111* 166* 173*  BUN 33* 37* 36* 42* 44*  CREATININE 2.00* 2.52* 2.30* 2.46* 2.75*  CALCIUM 7.9* 7.7* 8.0* 8.2* 8.0*  MG  --   --  1.9  --   --   PROT 4.9* 4.7*  --  5.7*  --   ALBUMIN 1.7* 1.7*  --  2.0*  --   AST 61* 46*  --  70*  --   ALT 58* 41  --  45*  --   ALKPHOS 116 112  --  156*  --   BILITOT 1.4* 1.2*  --  1.3*  --   GFRNONAA 34* 26* 29* 26* 23*  ANIONGAP 11 10 9 13 13     Lipids No results for input(s): "CHOL", "TRIG", "HDL", "LABVLDL", "LDLCALC", "CHOLHDL" in the last 168 hours.  Hematology Recent Labs  Lab 04/18/23 0554 04/19/23 1037 04/20/23 0847  WBC 13.1* 13.8* 16.0*  RBC 3.76* 3.89* 3.99*  HGB 10.0* 10.3* 10.5*  HCT 30.1* 31.4* 32.3*  MCV 80.1 80.7 81.0  MCH 26.6 26.5 26.3  MCHC 33.2 32.8 32.5  RDW 20.2* 20.5* 20.4*  PLT 662* 645* 662*   Thyroid No results for input(s): "TSH", "FREET4" in the last 168 hours.  BNP Recent Labs  Lab 04/20/23 0432 04/20/23 2119  BNP 2,698.9* 2,078.7*    DDimer No results for input(s): "DDIMER" in the last 168 hours.   Radiology    [place] EKG SITE RITE Result Date: 04/20/2023 If Site Rite image not attached, placement could not be confirmed due to current cardiac rhythm.  VAS [place] LOWER EXTREMITY VENOUS (DVT) Result Date: 04/20/2023  Lower Venous DVT Study Patient Name:  LOWELL ORDOYNE  Date of Exam:   04/19/2023 Medical Rec #: [id number]         Accession #:    [id number] Date of Birth: 10/07/1945         Patient Gender: M Patient Age:   3 years Exam Location:  [facility] Procedure:      VAS [place] LOWER EXTREMITY VENOUS (DVT) Referring Phys: Elijah Birk  --------------------------------------------------------------------------------  Indications: Swelling, Pain, and right calf. Other Indications: Recent fall after syncopal episode on 11/4. Comparison Study: 04/08/23 -Negative Performing Technologist: Marilynne Halsted RDMS, RVT  Examination Guidelines: A complete evaluation includes B-mode imaging, spectral Doppler, color Doppler, and power Doppler as needed of all accessible portions of each vessel. Bilateral testing is considered an integral part of a complete examination. Limited examinations for reoccurring indications may be performed as noted. The reflux portion of the exam is performed with the patient in reverse Trendelenburg.  +---------+---------------+---------+-----------+----------+--------------+ RIGHT    CompressibilityPhasicitySpontaneityPropertiesThrombus Aging +---------+---------------+---------+-----------+----------+--------------+ CFV      Full           Yes      Yes                                 +---------+---------------+---------+-----------+----------+--------------+ SFJ      Full                                                        +---------+---------------+---------+-----------+----------+--------------+ FV Prox  Full                                                        +---------+---------------+---------+-----------+----------+--------------+ FV Mid   Full                                                        +---------+---------------+---------+-----------+----------+--------------+ FV DistalFull                                                        +---------+---------------+---------+-----------+----------+--------------+  PFV      Full                                                        +---------+---------------+---------+-----------+----------+--------------+ POP      Full           Yes      Yes                                  +---------+---------------+---------+-----------+----------+--------------+ PTV      Full                                                        +---------+---------------+---------+-----------+----------+--------------+ PERO     Full                                                        +---------+---------------+---------+-----------+----------+--------------+   +---------+---------------+---------+-----------+----------+--------------+ LEFT     CompressibilityPhasicitySpontaneityPropertiesThrombus Aging +---------+---------------+---------+-----------+----------+--------------+ CFV      Full                    Yes                                 +---------+---------------+---------+-----------+----------+--------------+ SFJ      Full                                                        +---------+---------------+---------+-----------+----------+--------------+ FV Prox  Full                                                        +---------+---------------+---------+-----------+----------+--------------+ FV Mid   Full                                                        +---------+---------------+---------+-----------+----------+--------------+ FV DistalFull                                                        +---------+---------------+---------+-----------+----------+--------------+ PFV      Full                                                        +---------+---------------+---------+-----------+----------+--------------+  POP      Full           Yes      Yes                                 +---------+---------------+---------+-----------+----------+--------------+ PTV      Full                                                        +---------+---------------+---------+-----------+----------+--------------+ PERO     Full                                                         +---------+---------------+---------+-----------+----------+--------------+     Summary: BILATERAL: - No evidence of deep vein thrombosis seen in the lower extremities, bilaterally. -No evidence of popliteal cyst, bilaterally.   *See table(s) above for measurements and observations. Electronically signed by Coral Else MD on 04/20/2023 at 10:03:45 AM.    Final    DG Chest Port 1 View Result Date: 04/19/2023 CLINICAL DATA:  Shortness of breath for a day EXAM: PORTABLE CHEST 1 VIEW COMPARISON:  X-ray 04/09/2023.  Older exams as well. FINDINGS: Sternal wires. Enlarged cardiopericardial silhouette with a calcified aorta. Bilateral upper chest battery packs with leads along the right side of the heart. Please correlate with history. Increasing small bilateral pleural effusions with some adjacent opacity. Mild interstitial prominence. No pneumothorax. IMPRESSION: Postop chest with enlarged heart. 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 Increasing small bilateral pleural effusions with some vascular congestion and trace interstitial prominence. Recommend follow up Electronically Signed   By: Karen Kays M.D.   On: 04/19/2023 12:21    Cardiac Studies     Patient Profile      CERGIO LANDAU is a 77 y.o. male with hx of CAD, ICM and atrial flutter  who is being seen 04/20/2023 for the evaluation of worsening CHF .   Assessment & Plan    1  Atrial fibrillation / flutter   Pt remains in atrial fibrillation  By review of notes from rehab service probably went into this a few days ago    He has not tolerated this in past, with worsening CHF symptoms  Has had several cardioversions in past 1 year   Started on IV amiodarone yesterday (was on 200 mg daily)    Got a couple boluses and 60 mg /hour gtt    Rates in low 100s  Will give an additional bolus of amiodarone now  Review with CHF service  timing of cardioversion   ADDENDUM:  After talking to B Coldiron (CHF service) will continue IV  amiodarone today  There was a pause in Eliquis and he will be a 3 wks this week   (he probably has only been in afib for shorter time anyway) Plan for DCCV tomorrow with anesthesia support     2  HFrEF  Ischemic CM     Yesterday was in pulmonary edema with afib with RVR Lasix 80 then 160 mg given without a signficant response in UO, though he was breathing better and O2  needs have gone down    Milrinone started last night (no PICC due to devices bilaterally)   Unfort UO has not increased signficantly after next lasix dose given  Cr is 2.75 this am ( up from 2.46 yesterday  Note pt hs been continued on midodrine  3  CAD   CABG in 2018  4.  REnal   As above   Cr 2.75 this am  Baseline difficult to say but probably2 to 2.2   Has been labile     For questions or updates, please contact [facility] Please consult [email or web address] for contact info under        Signed, Dietrich Pates, MD  04/21/2023, 7:14 AM

## 2023-04-22 ENCOUNTER — Other Ambulatory Visit: Payer: Self-pay

## 2023-04-22 ENCOUNTER — Inpatient Hospital Stay (HOSPITAL_COMMUNITY): Payer: Self-pay | Admitting: Anesthesiology

## 2023-04-22 ENCOUNTER — Encounter (HOSPITAL_COMMUNITY): Admission: RE | Disposition: E | Payer: Self-pay | Source: Ambulatory Visit | Attending: Internal Medicine

## 2023-04-22 ENCOUNTER — Inpatient Hospital Stay (HOSPITAL_COMMUNITY): Payer: Medicare Other | Admitting: Anesthesiology

## 2023-04-22 ENCOUNTER — Inpatient Hospital Stay (HOSPITAL_COMMUNITY): Payer: Medicare Other

## 2023-04-22 DIAGNOSIS — I110 Hypertensive heart disease with heart failure: Secondary | ICD-10-CM

## 2023-04-22 DIAGNOSIS — I509 Heart failure, unspecified: Secondary | ICD-10-CM | POA: Diagnosis not present

## 2023-04-22 DIAGNOSIS — R570 Cardiogenic shock: Secondary | ICD-10-CM

## 2023-04-22 DIAGNOSIS — I4892 Unspecified atrial flutter: Secondary | ICD-10-CM | POA: Diagnosis not present

## 2023-04-22 DIAGNOSIS — I2510 Atherosclerotic heart disease of native coronary artery without angina pectoris: Secondary | ICD-10-CM | POA: Diagnosis not present

## 2023-04-22 HISTORY — PX: CARDIOVERSION: EP1203

## 2023-04-22 LAB — BASIC METABOLIC PANEL
Anion gap: 12 (ref 5–15)
Anion gap: 13 (ref 5–15)
BUN: 47 mg/dL — ABNORMAL HIGH (ref 8–23)
BUN: 51 mg/dL — ABNORMAL HIGH (ref 8–23)
CO2: 24 mmol/L (ref 22–32)
CO2: 25 mmol/L (ref 22–32)
Calcium: 7.8 mg/dL — ABNORMAL LOW (ref 8.9–10.3)
Calcium: 8.1 mg/dL — ABNORMAL LOW (ref 8.9–10.3)
Chloride: 92 mmol/L — ABNORMAL LOW (ref 98–111)
Chloride: 91 mmol/L — ABNORMAL LOW (ref 98–111)
Creatinine, Ser: 2.91 mg/dL — ABNORMAL HIGH (ref 0.61–1.24)
Creatinine, Ser: 2.76 mg/dL — ABNORMAL HIGH (ref 0.61–1.24)
GFR, Estimated: 22 mL/min — ABNORMAL LOW (ref >60)
GFR, Estimated: 23 mL/min — ABNORMAL LOW (ref >60)
Glucose, Bld: 145 mg/dL — ABNORMAL HIGH (ref 70–99)
Glucose, Bld: 173 mg/dL — ABNORMAL HIGH (ref 70–99)
Potassium: 3.7 mmol/L (ref 3.5–5.1)
Potassium: 4.4 mmol/L (ref 3.5–5.1)
Sodium: 128 mmol/L — ABNORMAL LOW (ref 135–145)
Sodium: 129 mmol/L — ABNORMAL LOW (ref 135–145)

## 2023-04-22 LAB — CBC
HCT: 28.3 % — ABNORMAL LOW (ref 39.0–52.0)
Hemoglobin: 9.2 g/dL — ABNORMAL LOW (ref 13.0–17.0)
MCH: 26.1 pg (ref 26.0–34.0)
MCHC: 32.5 g/dL (ref 30.0–36.0)
MCV: 80.4 fL (ref 80.0–100.0)
Platelets: 627 10*3/uL — ABNORMAL HIGH (ref 150–400)
RBC: 3.52 MIL/uL — ABNORMAL LOW (ref 4.22–5.81)
RDW: 20.3 % — ABNORMAL HIGH (ref 11.5–15.5)
WBC: 13.4 10*3/uL — ABNORMAL HIGH (ref 4.0–10.5)
nRBC: 0 % (ref 0.0–0.2)

## 2023-04-22 LAB — LACTIC ACID, PLASMA: Lactic Acid, Venous: 2.1 mmol/L (ref 0.5–1.9)

## 2023-04-22 LAB — GLUCOSE, CAPILLARY
Glucose-Capillary: 156 mg/dL — ABNORMAL HIGH (ref 70–99)
Glucose-Capillary: 143 mg/dL — ABNORMAL HIGH (ref 70–99)
Glucose-Capillary: 195 mg/dL — ABNORMAL HIGH (ref 70–99)
Glucose-Capillary: 179 mg/dL — ABNORMAL HIGH (ref 70–99)

## 2023-04-22 LAB — MAGNESIUM: Magnesium: 2.2 mg/dL (ref 1.7–2.4)

## 2023-04-22 SURGERY — CARDIOVERSION (CATH LAB)
Anesthesia: General

## 2023-04-22 MED ORDER — APIXABAN 5 MG PO TABS
ORAL_TABLET | ORAL | Status: AC
  Filled 2023-04-22: qty 1

## 2023-04-22 MED ORDER — MILRINONE LACTATE IN DEXTROSE 20-5 MG/100ML-% IV SOLN
0.2500 ug/kg/min | INTRAVENOUS | Status: DC
  Administered 2023-04-22 – 2023-04-29 (×11): 0.25 ug/kg/min via INTRAVENOUS
  Filled 2023-04-22 (×13): qty 100

## 2023-04-22 MED ORDER — FUROSEMIDE 10 MG/ML IJ SOLN
160.0000 mg | Freq: Once | INTRAVENOUS | Status: AC
  Administered 2023-04-22: 160 mg via INTRAVENOUS
  Filled 2023-04-22: qty 10

## 2023-04-22 MED ORDER — METOLAZONE 5 MG PO TABS
5.0000 mg | ORAL_TABLET | Freq: Once | ORAL | Status: AC
  Administered 2023-04-22: 5 mg via ORAL
  Filled 2023-04-22: qty 1

## 2023-04-22 MED ORDER — POTASSIUM CHLORIDE CRYS ER 20 MEQ PO TBCR
20.0000 meq | EXTENDED_RELEASE_TABLET | Freq: Once | ORAL | Status: AC
  Administered 2023-04-22: 20 meq via ORAL
  Filled 2023-04-22: qty 1

## 2023-04-22 MED ORDER — PROPOFOL 10 MG/ML IV BOLUS
INTRAVENOUS | Status: DC | PRN
  Administered 2023-04-22: 40 mg via INTRAVENOUS

## 2023-04-22 SURGICAL SUPPLY — 1 items: PAD DEFIB RADIO PHYSIO CONN (PAD) ×1 IMPLANT

## 2023-04-22 NOTE — CV Procedure (Signed)
    Electrical Cardioversion Procedure Note [name] [name] [id number] 07/15/1945  Procedure: Electrical Cardioversion Indications:  Atrial Flutter  Time Out: Verified patient identification, verified procedure,medications/allergies/relevent history reviewed, required imaging and test results available.  Performed  Procedure Details  The patient was NPO after midnight. Anesthesia was administered at the beside  by Dr.Oddono with 45mg  of propofol.  Cardioversion was performed with synchronized biphasic defibrillation via AP pads with 200 joules.  1 attempt(s) were performed.  The patient converted to normal sinus rhythm. The patient tolerated the procedure well   IMPRESSION:  Successful cardioversion of atrial flutter (on amiodarone and milrinone)     Donato Schultz 04/22/2023, 9:35 AM

## 2023-04-22 NOTE — Anesthesia Postprocedure Evaluation (Signed)
Anesthesia Post Note  Patient: [name] [name]  Procedure(s) Performed: CARDIOVERSION     Patient location during evaluation: PACU Anesthesia Type: General Level of consciousness: awake and alert Pain management: pain level controlled Vital Signs Assessment: post-procedure vital signs reviewed and stable Respiratory status: spontaneous breathing, nonlabored ventilation, respiratory function stable and patient connected to nasal cannula oxygen Cardiovascular status: blood pressure returned to baseline and stable Postop Assessment: no apparent nausea or vomiting Anesthetic complications: no   No notable events documented.  Last Vitals:  Vitals:   04/22/23 1100 04/22/23 1200  BP:    Pulse: 87 86  Resp: 19 14  Temp:    SpO2: 92% 94%    Last Pain:  Vitals:   04/22/23 0942  TempSrc: Temporal  PainSc: 0-No pain                 [name]

## 2023-04-22 NOTE — Transfer of Care (Signed)
Immediate Anesthesia Transfer of Care Note  Patient: [name] [name]  Procedure(s) Performed: CARDIOVERSION  Patient Location: PACU  Anesthesia Type:General  Level of Consciousness: drowsy  Airway & Oxygen Therapy: Patient Spontanous Breathing and Patient connected to face mask oxygen  Post-op Assessment: Report given to RN and Post -op Vital signs reviewed and stable  Post vital signs: Reviewed and stable  Last Vitals:  Vitals Value Taken Time  BP    Temp    Pulse 77 04/22/23 0940  Resp 16 04/22/23 0940  SpO2 100 % 04/22/23 0940  Vitals shown include unfiled device data.  Last Pain:  Vitals:   04/22/23 0810  TempSrc: Temporal  PainSc:          Complications: No notable events documented.

## 2023-04-22 NOTE — Progress Notes (Addendum)
Advanced Heart Failure Rounding Note  Cardiologist: Arvilla Meres, MD   Subjective:   Chief Complaint:   12/22: started on empiric milrinone 0.125 + high dose lasix, 160 IV x 2  Remains on milrinone 0.125. UOP sluggish despite support, only 735 cc yesterday. SCr up, 2.56>>2.91. No cental access for co-ox/CVP monitoring   Na 128   Underwent DCCV this morning, back to NSR. On amio gtt at 60/hr   WBC trending down, 16>>13K  C/w b/l LEE. Says breathing has improved post cardioversion.     Objective:   Weight Range: 92.2 kg Body mass index is 26.1 kg/m.   Vital Signs:   Temp:  [96.6 F (35.9 C)-97.8 F (36.6 C)] 97.5 F (36.4 C) (12/23 0942) Pulse Rate:  [114-118] 118 (12/23 0900) Resp:  [11-23] 17 (12/23 0900) BP: (92-137)/(65-106) 126/72 (12/23 0900) SpO2:  [92 %-97 %] 96 % (12/23 0900) Weight:  [92.2 kg] 92.2 kg (12/23 0423) Last BM Date : 04/21/23  Weight change: Filed Weights   04/20/23 1315 04/21/23 0519 04/22/23 0423  Weight: 72.1 kg 91 kg 92.2 kg    Intake/Output:   Intake/Output Summary (Last 24 hours) at 04/22/2023 0949 Last data filed at 04/22/2023 0636 Gross per 24 hour  Intake 1401.83 ml  Output 560 ml  Net 841.83 ml      Physical Exam    General:  fatigued appearing. No resp difficulty HEENT: Normal Neck: Supple. JVP elevated to jaw/ear. Carotids 2+ bilat; no bruits. No lymphadenopathy or thyromegaly appreciated. Cor: PMI nondisplaced. Regular rate & rhythm. No rubs, gallops or murmurs. Lungs: decreased BS at the bases  Abdomen: Soft, nontender, nondistended. No hepatosplenomegaly. No bruits or masses. Good bowel sounds. Extremities: No cyanosis, clubbing, rash, 2-3+ b/l LE edema Neuro: Alert & orientedx3, cranial nerves grossly intact. moves all 4 extremities w/o difficulty. Affect pleasant GU: + foley    Telemetry   NSR 90 bpm, personally reviewed   EKG    Post DCCV EKG, NSR 98 bpm   Labs    CBC Recent Labs     04/19/23 1037 04/20/23 0847 04/21/23 0921 04/22/23 0323  WBC 13.8*   < > 15.4* 13.4*  NEUTROABS 12.0*  --   --   --   HGB 10.3*   < > 10.0* 9.2*  HCT 31.4*   < > 29.8* 28.3*  MCV 80.7   < > 80.1 80.4  PLT 645*   < > 720* 627*   < > = values in this interval not displayed.   Basic Metabolic Panel Recent Labs    16/10/96 0921 04/22/23 0323  NA 129* 128*  K 3.8 3.7  CL 91* 92*  CO2 25 24  GLUCOSE 153* 145*  BUN 46* 47*  CREATININE 2.56* 2.91*  CALCIUM 8.1* 7.8*  MG 2.0 2.2   Liver Function Tests Recent Labs    04/20/23 0847  AST 70*  ALT 45*  ALKPHOS 156*  BILITOT 1.3*  PROT 5.7*  ALBUMIN 2.0*   No results for input(s): "LIPASE", "AMYLASE" in the last 72 hours. Cardiac Enzymes No results for input(s): "CKTOTAL", "CKMB", "CKMBINDEX", "TROPONINI" in the last 72 hours.  BNP: BNP (last 3 results) Recent Labs    04/20/23 0432 04/20/23 2119 04/21/23 0921  BNP 2,698.9* 2,078.7* 1,292.0*    ProBNP (last 3 results) Recent Labs    09/25/22 0953  PROBNP 2,187*     D-Dimer No results for input(s): "DDIMER" in the last 72 hours. Hemoglobin A1C No results for  input(s): "HGBA1C" in the last 72 hours. Fasting Lipid Panel No results for input(s): "CHOL", "HDL", "LDLCALC", "TRIG", "CHOLHDL", "LDLDIRECT" in the last 72 hours. Thyroid Function Tests Recent Labs    04/21/23 0921  TSH 3.735    Other results:   Imaging    No results found.   Medications:     Scheduled Medications:  [MAR Hold] acetaminophen  1,000 mg Oral TID   apixaban       [MAR Hold] apixaban  5 mg Oral BID   [MAR Hold] atorvastatin  80 mg Oral Daily   [MAR Hold] calcium-vitamin D  1 tablet Oral BID WC   [MAR Hold] Chlorhexidine Gluconate Cloth  6 each Topical Daily   [MAR Hold] Gerhardt's butt cream   Topical BID   [MAR Hold] insulin aspart  0-15 Units Subcutaneous TID WC   [MAR Hold] insulin aspart  0-5 Units Subcutaneous QHS   [MAR Hold] leptospermum manuka honey  1  Application Topical Daily   [MAR Hold] midodrine  5 mg Oral TID WC   [MAR Hold] polyethylene glycol  17 g Oral Daily   [MAR Hold] senna-docusate  2 tablet Oral BID   [MAR Hold] sodium bicarbonate  650 mg Oral BID    Infusions:  amiodarone 60 mg/hr (04/22/23 0803)   [MAR Hold] furosemide Stopped (04/21/23 1858)   milrinone 0.125 mcg/kg/min (04/22/23 0600)    PRN Medications: apixaban, [MAR Hold] magnesium hydroxide, [MAR Hold] methocarbamol, [MAR Hold] mouth rinse, [MAR Hold] oxyCODONE, [MAR Hold] zolpidem    Patient Profile   77 y.o. 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 Readmitted on 12/21 for recurrent afib w/ RVR and a/c CHF w/ voulume overload and low-output, requiring inotropic support w/ milrinone.   Assessment/Plan   1. Acute on chronic systolic heart failure: Advanced disease, previously required milrinone for diuresis during his last admission.  Was discharged on midodrine without significant GDMT. Now readmitted w/ volume overload in setting of recurrent Afib w/ RVR  - poor urinary response to high dose IV Lasix despite empiric milrinone support, currently on 0.125 mcg/kg/min. SCr also rising, 2.56>>2.91. he has marked edema on exam but also in setting of low albumin, 1.7 on admit. He will need central access to check CVP to guide diuresis as well to follow co-ox to guide inotropic support w/ milrinone, though increasing milrinone will make maintaining NSR more difficult - will place UNNA boots for edema  - will discuss central access w/ Dr. Gala Romney. PICC deferred over the weekend given difficulty with  access, dual sided devices -GDMT limited by blood pressure, on midodrine 5 mg tid  -Extremely poor long-term prognosis, does not appear to be in advanced therapies candidate. He is not interested in palliative care discussions at this time   2. Atrial fibrillation with RVR: failed recent DCCV. Back in AFib on admit, suspect triggered by worsening volume overload and recent infection  - s/p DCCV this morning, back in NSR  -Continue IV amiodarone at 60 mg/hr while on milrinone  -Continue anticoagulation, which was restarted on 12/4, Eliquis 5 mg bid  -Management of infective component per hospitalist team  3. CAD: CABG 2018 - stable w/o CP  - apixaban, statin  4. 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 Diabetes: - per IM  - SSI   6. Severe malnutrition - albumin 1.7 on admit  - per IM     Length of Stay: 2  Robbie Lis, PA-C  04/22/2023, 9:49 AM  Advanced Heart Failure Team Pager [phone] (M-F; 7a - 5p)  Please contact [facility] for night-coverage after hours (5p -7a ) and weekends on [email or web address]  Patient seen and examined with the above-signed Advanced Practice Provider and/or Housestaff. I personally reviewed laboratory data, imaging studies and relevant notes. I independently examined the patient and formulated the important aspects of the plan. I have edited the note to reflect any of my changes or salient points. I have personally discussed the plan with the patient and/or family.  He is back in NSR after DC-CV. Feels weak. Denies SOB.   On milrinone 0.125. Not diuresing much with high-dose IV lasix. Scr up to 2.9  General:  Weak appearing. No resp difficulty HEENT: normal Neck: supple. JVP to ear. Carotids 2+ bilat; no bruits. No lymphadenopathy or thryomegaly appreciated. Cor: Regular rate &  rhythm. 2/6 AS 2/6 TR Lungs: clear Abdomen: soft, nontender, + distended. No hepatosplenomegaly. No bruits or masses. Good bowel sounds. Extremities: no cyanosis, clubbing, rash, 4+ edema on RLE 2+ on LLE Neuro: alert & orientedx3, cranial nerves grossly intact. moves all 4 extremities w/o difficulty. Affect pleasant  He has significant volume overload as well as lymphedema in RLE. He is not diuresing well despite milrinone support. Suspect component of cardiorenal syndrome.  Will increase milrinone. Place compression hose. Increase diuresis.   If no response can consider RHC tomorrow.   Arvilla Meres, MD  5:57 PM

## 2023-04-22 NOTE — Progress Notes (Addendum)
Orthopedic Tech Progress Note Patient Details:  [name] [name] 07/11/1945 [id number]  Patient had wounds that were covered when I entered the room.  I inquired with the nurse if the bandages needed to be changed before I applied the unna boots as the bandages were each dated 12/21. The nurse advised to remove the bandages and that they did not need to be re dressed and the unna boots were to be directly applied over the wounds. Nurse assisted with holding the patients legs as I applied the unna boots.   Ortho Devices Type of Ortho Device: Radio broadcast assistant Ortho Device/Splint Location: BLE Ortho Device/Splint Interventions: Ordered, Application   Post Interventions Patient Tolerated: Well  Diannia Ruder 04/22/2023, 8:23 PM

## 2023-04-22 NOTE — Progress Notes (Signed)
Heart Failure Navigator Progress Note  Assessed for Heart & Vascular TOC clinic readiness.  Patient does not meet criteria due to Advanced Heart Failure Team patient of Dr. Bensimhon.   Navigator will sign off at this time.   Lucilia Yanni, BSN, RN Heart Failure Nurse Navigator Secure Chat Only   

## 2023-04-22 NOTE — Plan of Care (Signed)
  Problem: Education: Goal: Knowledge of General Education information will improve Description Including pain rating scale, medication(s)/side effects and non-pharmacologic comfort measures Outcome: Progressing   

## 2023-04-22 NOTE — TOC Initial Note (Addendum)
Transition of Care [facility]) - Initial/Assessment Note    Patient Details  Name: [name] [name] MRN: [id number] Date of Birth: December 10, 1945  Transition of Care [facility]) CM/SW Contact:    Elliot Cousin, RN Phone Number: [phone] 04/22/2023, 4:23 PM  Clinical Narrative:                 CM spoke wife at bedside. Pt states he wants to finish up at IP rehab at dc. He was active with [facility] for [facility]. Has RW and raised commode at home. Wife at home to assist with his care.  Will continue to follow for dc needs.   Expected Discharge Plan: IP Rehab Facility Barriers to Discharge: Continued Medical Work up   Patient Goals and CMS Choice Patient states their goals for this hospitalization and ongoing recovery are:: wants to remain independent CMS Medicare.gov Compare Post Acute Care list provided to:: Patient Choice offered to / list presented to : Patient      Expected Discharge Plan and Services   Discharge Planning Services: CM Consult Post Acute Care Choice: IP Rehab                                        Prior Living Arrangements/Services   Lives with:: Spouse   Do you feel safe going back to the place where you live?: Yes          Current home services: DME    Activities of Daily Living   ADL Screening (condition at time of admission) Independently performs ADLs?: No Does the patient have a NEW difficulty with bathing/dressing/toileting/self-feeding that is expected to last >3 days?: No Does the patient have a NEW difficulty with getting in/out of bed, walking, or climbing stairs that is expected to last >3 days?: No Does the patient have a NEW difficulty with communication that is expected to last >3 days?: No Is the patient deaf or have difficulty hearing?: No Does the patient have difficulty seeing, even when wearing glasses/contacts?: No Does the patient have difficulty concentrating, remembering, or making decisions?: No  Permission  Sought/Granted Permission sought to share information with : Case Manager, Family Supports, PCP Permission granted to share information with : Yes, Verbal Permission Granted  Share Information with NAME: [name] [name]     Permission granted to share info w Relationship: wife  Permission granted to share info w Contact Information: [phone]  Emotional Assessment Appearance:: Appears stated age Attitude/Demeanor/Rapport: Engaged Affect (typically observed): Accepting Orientation: : Oriented to Self, Oriented to Place, Oriented to  Time, Oriented to Situation   Psych Involvement: No (comment)  Admission diagnosis:  Atrial flutter [facility]) [I48.92] Patient Active Problem List   Diagnosis Date Noted   Leukocytosis 04/09/2023   Debility 04/08/2023   Low output heart failure (HCC) 04/01/2023   AKI (acute kidney injury) (HCC) 04/01/2023   Retroperitoneal hematoma 03/26/2023   SIRS (systemic inflammatory response syndrome) (HCC) 03/05/2023   Fall at home, initial encounter 03/05/2023   Syncope 03/05/2023   Transient hypotension 03/05/2023   Acute kidney injury superimposed on chronic kidney disease (HCC) 03/05/2023   Heart failure with reduced ejection fraction (HCC) 03/05/2023   Thyroid nodule 03/05/2023   Acute on chronic combined systolic and diastolic CHF (congestive heart failure) (HCC) 08/02/2021   Paroxysmal A-fib/flutter 08/02/2021   Cholelithiasis with choledocholithiasis 08/19/2020   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   Lumbar  stenosis with neurogenic claudication 06/16/2019   Ischemic cardiomyopathy 11/20/2017   Elevated troponin and patient with history of CAD/CABG 05/10/2017   Chronic combined systolic and diastolic CHF (congestive heart failure) (HCC) 05/10/2017   Stage 3b chronic kidney disease (HCC) 05/10/2017   Acute epididymitis 05/10/2017   Left epididymitis    Orchitis, right    S/P CABG x 2 04/08/2017   ST elevation myocardial  infarction (STEMI) of anterior wall (HCC) 03/31/2017   STEMI (ST elevation myocardial infarction) (HCC) 03/31/2017   Essential hypertension    Diabetes mellitus type 2, controlled (HCC)    PCP:  [name], MD Pharmacy:   [phone] [place], [place] - [address] [place] [address] Phone: [phone] Fax: [phone]  [facility] [place], [place] - [facility] AT Portal to Registered [address] [place] [address] Phone: [phone] Fax: [phone]  [facility] [address] [place] [address] Phone: [phone] Fax: [phone]     Social Drivers of Health (SDOH) Social History: SDOH Screenings   Food Insecurity: No Food Insecurity (04/20/2023)  Housing: Low Risk  (04/20/2023)  Transportation Needs: No Transportation Needs (04/20/2023)  Recent Concern: Transportation Needs - Unmet Transportation Needs (03/11/2023)   Received from [facility]  Utilities: Not At Risk (04/20/2023)  Physical Activity: Inactive (07/11/2017)  Stress: No Stress Concern Present (07/11/2017)  Tobacco Use: Medium Risk (04/20/2023)   SDOH Interventions:     Readmission Risk Interventions     No data to display

## 2023-04-22 NOTE — Progress Notes (Signed)
PROGRESS NOTE    [name] [name]  JOA:[id number] DOB: May 17, 1945 DOA: 04/20/2023 PCP: [name], MD   77 y.o. male with COPD, CAD, CABG 2018, ischemic cardiomyopathy EF 20-25%, paroxysmal A-fib, hypertension, type 2 diabetes mellitus, history of LV thrombus.   -Admitted 11/4 w/syncope fall, Afib>cardioverted 11/7, discharged 11/8 -Readmitted 11/25 with increased right hip pain, imaging noted retroperitoneal hematoma extending to distal psoas and iliacus and also volume overloaded, diuresed, followed by nephrology and cards then, started on midodrine, discharged to rehab 12/9 on torsemide -In rehab he had worsening leukocytosis up to 43K, infectious disease consulted 12/10, on 12/12 underwent ultrasound-guided aspiration of right hip, 30 cc of turbid brownish fluid drained, Cx w LACTICASEIBACILLUS CASEI, he was treated with few days of IV vancomycin which does not have efficacy against this, leukocytosis largely spontaneously started improving, vancomycin discontinued 12/16.  Last 2 days had increased shortness of breath, elevated heart rate etc.  -12/21: Readmitted from CIR back to inpatient, 2H with A-fib RVR and CHF -12/21, heart failure team consulted, started on milrinone, Lasix dose increased  Subjective: -Denies significant dyspnea this morning, but has not gotten out of bed, remains in A-fib with RVR  Assessment and Plan:  Acute on chronic systolic CHF, ischemic cardiomyopathy Low output state -Last echo 11/24 with EF 20-25%, mildly reduced RV -volume overloaded, compounded by severe hypoalbuminemia (1.7-2) and third spacing -CHF team following, on low-dose milrinone and Lasix gtt., poor response to diuretics, going for cardioversion today -CHF team consulting, anticipate needs higher dose of milrinone -On midodrine -Add GDMT slowly if blood pressure tolerates   Atrial fibrillation RVR -On amiodarone, prior cardioversions -Continue IV amiodarone -Per cardiology,  cardioversion planned this morning -Anticoagulation resumed -on apixaban   Retroperitoneal hematoma extending to distal psoas and iliacus -Following mechanical fall -Had profound leukocytosis around 40K in CIR,12/12 underwent ultrasound-guided aspiration of right hip, 30 cc of turbid brownish fluid drained, Cx w LACTICASEIBACILLUS CASEI, he was treated with few days of IV vancomycin which has no activity against this, leukocytosis largely spontaneously started improving, vancomycin discontinued 12/16, ID signed off -Mild leukocytosis and thrombocytosis persists, afebrile and nontoxic, no abdominal symptoms, monitor   Leukocytosis -See discussion above   CAD  Hx CABG 2018 -Stable   AKI, CKD 3B/4 -Baseline creatinine around 2-2.2 -With low output state, creatinine worsening, see above   Chronic anemia -Relatively stable   Type 2 diabetes mellitus -Recent A1c was 6.9 -On Ozempic at baseline, off metformin now -CBGs are stable, continue SSI,   Severe hypoalbuminemia -No clear history of cirrhosis -?  Malnutrition alone versus combination with chronic liver disease -Dietitian consult   GOALS: 77/M with severe cardiomyopathy, A-fib RVR, severe hypoalbuminemia and third spacing, retroperitoneal hematoma, I think his prognosis is poor, discussed CODE STATUS with patient and wife, he wishes to be a full code with full scope of Rx  DVT prophylaxis: Apixaban Code Status: Full code Family Communication: Wife at bedside Disposition Plan:   Consultants:    Procedures:   Antimicrobials:    Objective: Vitals:   04/22/23 0731 04/22/23 0810 04/22/23 0900 04/22/23 0942  BP:  122/77 126/72   Pulse:  (!) 114 (!) 118   Resp:  19 17   Temp: 97.7 F (36.5 C) (!) 96.6 F (35.9 C)  (!) 97.5 F (36.4 C)  TempSrc: Oral Temporal  Temporal  SpO2:  95% 96%   Weight:      Height:        Intake/Output Summary (Last 24  hours) at 04/22/2023 1123 Last data filed at 04/22/2023 0636 Gross  per 24 hour  Intake 1108.51 ml  Output 520 ml  Net 588.51 ml   Filed Weights   04/20/23 1315 04/21/23 0519 04/22/23 0423  Weight: 72.1 kg 91 kg 92.2 kg    Examination:  General exam: Appears calm and comfortable AAOx3 HEENT: Positive JVD Respiratory system: Fine bilateral Rales CVS: S1-S2, irregular rhythm Abdomen: Soft, nontender, bowel sounds present Extremities: 2+ edema extending to upper thigh Skin: No rashes Psychiatry:  Mood & affect appropriate.     Data Reviewed:   CBC: Recent Labs  Lab 04/18/23 0554 04/19/23 1037 04/20/23 0847 04/21/23 0921 04/22/23 0323  WBC 13.1* 13.8* 16.0* 15.4* 13.4*  NEUTROABS 11.3* 12.0*  --   --   --   HGB 10.0* 10.3* 10.5* 10.0* 9.2*  HCT 30.1* 31.4* 32.3* 29.8* 28.3*  MCV 80.1 80.7 81.0 80.1 80.4  PLT 662* 645* 662* 720* 627*   Basic Metabolic Panel: Recent Labs  Lab 04/19/23 0512 04/20/23 0847 04/20/23 2119 04/21/23 0921 04/22/23 0323  NA 131* 129* 130* 129* 128*  K 4.0 4.3 4.2 3.8 3.7  CL 94* 90* 92* 91* 92*  CO2 28 26 25 25 24   GLUCOSE 111* 166* 173* 153* 145*  BUN 36* 42* 44* 46* 47*  CREATININE 2.30* 2.46* 2.75* 2.56* 2.91*  CALCIUM 8.0* 8.2* 8.0* 8.1* 7.8*  MG 1.9  --   --  2.0 2.2  PHOS 5.0*  --   --   --   --    GFR: Estimated Creatinine Clearance: 24.7 mL/min (A) (by C-G formula based on SCr of 2.91 mg/dL (H)). Liver Function Tests: Recent Labs  Lab 04/18/23 0554 04/20/23 0847  AST 46* 70*  ALT 41 45*  ALKPHOS 112 156*  BILITOT 1.2* 1.3*  PROT 4.7* 5.7*  ALBUMIN 1.7* 2.0*   No results for input(s): "LIPASE", "AMYLASE" in the last 168 hours. No results for input(s): "AMMONIA" in the last 168 hours. Coagulation Profile: No results for input(s): "INR", "PROTIME" in the last 168 hours. Cardiac Enzymes: No results for input(s): "CKTOTAL", "CKMB", "CKMBINDEX", "TROPONINI" in the last 168 hours. BNP (last 3 results) Recent Labs    09/25/22 0953  PROBNP 2,187*   HbA1C: No results for input(s):  "HGBA1C" in the last 72 hours. CBG: Recent Labs  Lab 04/21/23 0645 04/21/23 1123 04/21/23 1548 04/21/23 2137 04/22/23 0655  GLUCAP 149* 193* 255* 179* 156*   Lipid Profile: No results for input(s): "CHOL", "HDL", "LDLCALC", "TRIG", "CHOLHDL", "LDLDIRECT" in the last 72 hours. Thyroid Function Tests: Recent Labs    04/21/23 0921  TSH 3.735   Anemia Panel: No results for input(s): "VITAMINB12", "FOLATE", "FERRITIN", "TIBC", "IRON", "RETICCTPCT" in the last 72 hours. Urine analysis:    Component Value Date/Time   COLORURINE YELLOW 04/07/2023 1810   APPEARANCEUR CLEAR 04/07/2023 1810   LABSPEC 1.009 04/07/2023 1810   PHURINE 5.0 04/07/2023 1810   GLUCOSEU NEGATIVE 04/07/2023 1810   HGBUR NEGATIVE 04/07/2023 1810   BILIRUBINUR NEGATIVE 04/07/2023 1810   KETONESUR NEGATIVE 04/07/2023 1810   PROTEINUR NEGATIVE 04/07/2023 1810   NITRITE NEGATIVE 04/07/2023 1810   LEUKOCYTESUR NEGATIVE 04/07/2023 1810   Sepsis Labs: @LABRCNTIP (procalcitonin:4,lacticidven:4)  ) Recent Results (from the past 240 hours)  MRSA Next Gen by PCR, Nasal     Status: None   Collection Time: 04/20/23 10:29 AM   Specimen: Nasal Mucosa; Nasal Swab  Result Value Ref Range Status   MRSA by PCR Next Gen NOT  DETECTED NOT DETECTED Final    Comment: (NOTE) The GeneXpert MRSA Assay (FDA approved for NASAL specimens only), is one component of a comprehensive MRSA colonization surveillance program. It is not intended to diagnose MRSA infection nor to guide or monitor treatment for MRSA infections. Test performance is not FDA approved in patients less than 68 years old. Performed at [facility], [address] [place], [place] [address]      Radiology Studies: EP STUDY Result Date: 04/22/2023 See surgical note for result.    Scheduled Meds:  acetaminophen  1,000 mg Oral TID   apixaban       apixaban  5 mg Oral BID   atorvastatin  80 mg Oral Daily   calcium-vitamin D  1 tablet Oral BID WC    Chlorhexidine Gluconate Cloth  6 each Topical Daily   Gerhardt's butt cream   Topical BID   insulin aspart  0-15 Units Subcutaneous TID WC   insulin aspart  0-5 Units Subcutaneous QHS   leptospermum manuka honey  1 Application Topical Daily   midodrine  5 mg Oral TID WC   polyethylene glycol  17 g Oral Daily   senna-docusate  2 tablet Oral BID   sodium bicarbonate  650 mg Oral BID   Continuous Infusions:  amiodarone 60 mg/hr (04/22/23 0803)   furosemide 160 mg (04/22/23 1055)   milrinone 0.125 mcg/kg/min (04/22/23 0600)     LOS: 2 days    Time spent:    Zannie Cove, MD [facility]   04/22/2023, 11:23 AM

## 2023-04-22 NOTE — Plan of Care (Signed)
patient discharged due to atrial fib with RVR,decompensated HF and unable to complete the CIR program

## 2023-04-22 NOTE — Plan of Care (Addendum)
Called to room. Increasing O2 req. He has advanced HF. Milrinone uptitrated today. He underwent DCCV for AF today. JVP to angle of mandible, rales b/l bases. Urine output marginal. CXR. Will redose lasix and add metolazone. Check BMP and LA.

## 2023-04-22 NOTE — Progress Notes (Signed)
I received a report from [place], LPN on, 63/01/60 at 7:00 am,  stating that the patient reported having another episode of shortness of breath. Reporting sensation of "difficulty of taking a breath". Also, expressing the sensation of "wanting to die". Respiratory Therapy on unit assisting at this time as well. Vitals obtained. On call Provider contacted and Charge Nurse made aware. Rapid Response contacted and remained with the patient until transfer to another unit.

## 2023-04-22 NOTE — Progress Notes (Signed)
Inpatient Rehabilitation Care Coordinator Discharge Note   Patient Details  Name: [name] [name] MRN: [id number] Date of Birth: 07-Jun-1945   Discharge location: D/c to acute due to medical reasons  Length of Stay: 11 days  Discharge activity level:    Home/community participation:    Patient response WU:[id number] Literacy - How often do you need to have someone help you when you read instructions, pamphlets, or other written material from your doctor or pharmacy?: Never  Patient response [id number] Isolation - How often do you feel lonely or isolated from those around you?: Patient unable to respond  Services provided included: RD, PT, MD, OT, CM, TR, Pharmacy, Neuropsych, SW, RN  Financial Services:  Financial Services Utilized: Medicare    Choices offered to/list presented to: patient and wife  Follow-up services arranged:  Other (Comment) (referral sent to [facility] and waiting on follow-up)           Patient response to transportation need: Is the patient able to respond to transportation needs?: Yes In the past 12 months, has lack of transportation kept you from medical appointments or from getting medications?: No In the past 12 months, has lack of transportation kept you from meetings, work, or from getting things needed for daily living?: No   Patient/Family verbalized understanding of follow-up arrangements:  Yes  Individual responsible for coordination of the follow-up plan: contact pt  Confirmed correct DME delivered: Gretchen Short 04/22/2023    Comments (or additional information):  Summary of Stay    Date/Time Discharge Planning CSW  04/15/23 1542 D/c to home with his wife who will be primary caregiver. Wife reports they will have support from her family, and their dtr and SIL live close by and are retired and can help PRN. SW will confirm there are no barriers to discharge. AAC  04/09/23 0944 TBA. D/c to home with his wife who will be primary  caregiver. Wife reports they will ahve support from her family, and their dtr and SIL live close by and are retired and can help PRN. SW will confirm there are no barriers to discharge. AAC       Shaunika Italiano A Lula Olszewski

## 2023-04-22 NOTE — Progress Notes (Signed)
Physical Therapy Note  Patient Details  Name: [name] [name] MRN: [id number] Date of Birth: 1946/03/28 Today's Date: 04/22/2023    Physical Therapy Discharge Note  This patient was unable to complete the inpatient rehab program due to change in medical status; therefore did not meet their long term goals. Pt left the program at a min/mod assist level for their functional mobility/ transfers. This patient is being discharged from PT services at this time.  Pt's perception of pain in the last five days was unable to answer at this time.    See CareTool for functional status details  If the patient is able to return to inpatient rehabilitation within 3 midnights, this may be considered an interrupted stay and therapy services will resume as ordered. Modification and reinstatement of their goals will be made upon completion of therapy service reevaluations.     Edwin Cap PT, DPT 04/22/2023, 12:13 PM

## 2023-04-22 NOTE — Interval H&P Note (Signed)
History and Physical Interval Note:  04/22/2023 7:30 AM  [name] [name]  has presented today for surgery, with the diagnosis of afib.  The various methods of treatment have been discussed with the patient and family. After consideration of risks, benefits and other options for treatment, the patient has consented to  Procedure(s): CARDIOVERSION (N/A) as a surgical intervention.  The patient's history has been reviewed, patient examined, no change in status, stable for surgery.  I have reviewed the patient's chart and labs.  Questions were answered to the patient's satisfaction.     [employer]

## 2023-04-23 ENCOUNTER — Encounter (HOSPITAL_COMMUNITY): Payer: Self-pay | Admitting: Cardiology

## 2023-04-23 ENCOUNTER — Encounter (HOSPITAL_COMMUNITY): Admission: RE | Disposition: E | Payer: Self-pay | Source: Ambulatory Visit | Attending: Internal Medicine

## 2023-04-23 ENCOUNTER — Inpatient Hospital Stay (HOSPITAL_COMMUNITY): Payer: Medicare Other

## 2023-04-23 DIAGNOSIS — R570 Cardiogenic shock: Secondary | ICD-10-CM

## 2023-04-23 HISTORY — PX: RIGHT HEART CATH: CATH118263

## 2023-04-23 LAB — CBC
HCT: 28.4 % — ABNORMAL LOW (ref 39.0–52.0)
Hemoglobin: 9.4 g/dL — ABNORMAL LOW (ref 13.0–17.0)
MCH: 26.3 pg (ref 26.0–34.0)
MCHC: 33.1 g/dL (ref 30.0–36.0)
MCV: 79.6 fL — ABNORMAL LOW (ref 80.0–100.0)
Platelets: 771 10*3/uL — ABNORMAL HIGH (ref 150–400)
RBC: 3.57 MIL/uL — ABNORMAL LOW (ref 4.22–5.81)
RDW: 20.1 % — ABNORMAL HIGH (ref 11.5–15.5)
WBC: 15.6 10*3/uL — ABNORMAL HIGH (ref 4.0–10.5)
nRBC: 0 % (ref 0.0–0.2)

## 2023-04-23 LAB — GLUCOSE, CAPILLARY
Glucose-Capillary: 178 mg/dL — ABNORMAL HIGH (ref 70–99)
Glucose-Capillary: 145 mg/dL — ABNORMAL HIGH (ref 70–99)
Glucose-Capillary: 146 mg/dL — ABNORMAL HIGH (ref 70–99)
Glucose-Capillary: 141 mg/dL — ABNORMAL HIGH (ref 70–99)
Glucose-Capillary: 143 mg/dL — ABNORMAL HIGH (ref 70–99)

## 2023-04-23 LAB — POCT I-STAT EG7
Acid-Base Excess: 4 mmol/L — ABNORMAL HIGH (ref 0.0–2.0)
Acid-Base Excess: 5 mmol/L — ABNORMAL HIGH (ref 0.0–2.0)
Bicarbonate: 29.1 mmol/L — ABNORMAL HIGH (ref 20.0–28.0)
Bicarbonate: 29.7 mmol/L — ABNORMAL HIGH (ref 20.0–28.0)
Calcium, Ion: 1 mmol/L — ABNORMAL LOW (ref 1.15–1.40)
Calcium, Ion: 1.05 mmol/L — ABNORMAL LOW (ref 1.15–1.40)
HCT: 30 % — ABNORMAL LOW (ref 39.0–52.0)
HCT: 31 % — ABNORMAL LOW (ref 39.0–52.0)
Hemoglobin: 10.2 g/dL — ABNORMAL LOW (ref 13.0–17.0)
Hemoglobin: 10.5 g/dL — ABNORMAL LOW (ref 13.0–17.0)
O2 Saturation: 48 %
O2 Saturation: 46 %
Potassium: 3.7 mmol/L (ref 3.5–5.1)
Potassium: 3.8 mmol/L (ref 3.5–5.1)
Sodium: 129 mmol/L — ABNORMAL LOW (ref 135–145)
Sodium: 127 mmol/L — ABNORMAL LOW (ref 135–145)
TCO2: 30 mmol/L (ref 22–32)
TCO2: 31 mmol/L (ref 22–32)
pCO2, Ven: 44.5 mm[Hg] (ref 44–60)
pCO2, Ven: 45 mm[Hg] (ref 44–60)
pH, Ven: 7.424 (ref 7.25–7.43)
pH, Ven: 7.427 (ref 7.25–7.43)
pO2, Ven: 26 mm[Hg] — CL (ref 32–45)
pO2, Ven: 25 mm[Hg] — CL (ref 32–45)

## 2023-04-23 LAB — BASIC METABOLIC PANEL
Anion gap: 15 (ref 5–15)
BUN: 52 mg/dL — ABNORMAL HIGH (ref 8–23)
CO2: 25 mmol/L (ref 22–32)
Calcium: 8.1 mg/dL — ABNORMAL LOW (ref 8.9–10.3)
Chloride: 87 mmol/L — ABNORMAL LOW (ref 98–111)
Creatinine, Ser: 2.76 mg/dL — ABNORMAL HIGH (ref 0.61–1.24)
GFR, Estimated: 23 mL/min — ABNORMAL LOW (ref >60)
Glucose, Bld: 155 mg/dL — ABNORMAL HIGH (ref 70–99)
Potassium: 3.9 mmol/L (ref 3.5–5.1)
Sodium: 127 mmol/L — ABNORMAL LOW (ref 135–145)

## 2023-04-23 LAB — LACTIC ACID, PLASMA: Lactic Acid, Venous: 1.5 mmol/L (ref 0.5–1.9)

## 2023-04-23 LAB — MAGNESIUM: Magnesium: 2.1 mg/dL (ref 1.7–2.4)

## 2023-04-23 SURGERY — RIGHT HEART CATH
Anesthesia: LOCAL

## 2023-04-23 MED ORDER — POTASSIUM CHLORIDE CRYS ER 20 MEQ PO TBCR
40.0000 meq | EXTENDED_RELEASE_TABLET | Freq: Once | ORAL | Status: AC
  Administered 2023-04-23: 40 meq via ORAL
  Filled 2023-04-23: qty 2

## 2023-04-23 MED ORDER — ACETAMINOPHEN 325 MG PO TABS
650.0000 mg | ORAL_TABLET | ORAL | Status: DC | PRN
Start: 2023-04-23 — End: 2023-04-29
  Administered 2023-04-25: 650 mg via ORAL
  Filled 2023-04-23: qty 2

## 2023-04-23 MED ORDER — LABETALOL HCL 5 MG/ML IV SOLN
10.0000 mg | INTRAVENOUS | Status: AC | PRN
Start: 2023-04-23 — End: 2023-04-23

## 2023-04-23 MED ORDER — LIDOCAINE HCL (PF) 1 % IJ SOLN
INTRAMUSCULAR | Status: AC
  Filled 2023-04-23: qty 30

## 2023-04-23 MED ORDER — ASPIRIN 81 MG PO CHEW: 81.0000 mg | CHEWABLE_TABLET | ORAL | Status: AC

## 2023-04-23 MED ORDER — SODIUM CHLORIDE 0.9% IV SOLUTION
INTRAVENOUS | Status: DC
  Administered 2023-04-23: 10 mL/h via INTRAVENOUS

## 2023-04-23 MED ORDER — SODIUM CHLORIDE 0.9% IV SOLUTION
INTRAVENOUS | Status: DC | PRN
Start: 2023-04-23 — End: 2023-04-30

## 2023-04-23 MED ORDER — MIDAZOLAM HCL 2 MG/2ML IJ SOLN
INTRAMUSCULAR | Status: AC
  Filled 2023-04-23: qty 2

## 2023-04-23 MED ORDER — HYDRALAZINE HCL 20 MG/ML IJ SOLN: 10.0000 mg | INTRAMUSCULAR | Status: AC | PRN

## 2023-04-23 MED ORDER — LIDOCAINE HCL (PF) 1 % IJ SOLN
INTRAMUSCULAR | Status: DC | PRN
  Administered 2023-04-23: 10 mL

## 2023-04-23 MED ORDER — SODIUM CHLORIDE 0.9 % IV SOLN
INTRAVENOUS | Status: AC
  Administered 2023-04-23: 10 mL/h via INTRAVENOUS

## 2023-04-23 MED ORDER — SODIUM CHLORIDE 0.9% FLUSH
3.0000 mL | INTRAVENOUS | Status: DC | PRN
Start: 2023-04-23 — End: 2023-04-30

## 2023-04-23 MED ORDER — SODIUM CHLORIDE 0.9 % IV SOLN: INTRAVENOUS | Status: AC

## 2023-04-23 MED ORDER — MIDAZOLAM HCL 2 MG/2ML IJ SOLN
INTRAMUSCULAR | Status: DC | PRN
  Administered 2023-04-23: .5 mg via INTRAVENOUS

## 2023-04-23 MED ORDER — HEPARIN (PORCINE) IN NACL 1000-0.9 UT/500ML-% IV SOLN
INTRAVENOUS | Status: DC | PRN
  Administered 2023-04-23: 500 mL

## 2023-04-23 MED ORDER — SODIUM CHLORIDE 0.9 % IV SOLN: 250.0000 mL | INTRAVENOUS | Status: AC | PRN

## 2023-04-23 MED ORDER — ONDANSETRON HCL 4 MG/2ML IJ SOLN
4.0000 mg | Freq: Four times a day (QID) | INTRAMUSCULAR | Status: DC | PRN
  Administered 2023-04-24: 4 mg via INTRAVENOUS
  Filled 2023-04-23: qty 2

## 2023-04-23 MED ORDER — SODIUM CHLORIDE 0.9% FLUSH
3.0000 mL | Freq: Two times a day (BID) | INTRAVENOUS | Status: DC
  Administered 2023-04-23 – 2023-04-29 (×11): 3 mL via INTRAVENOUS

## 2023-04-23 MED ORDER — FUROSEMIDE 10 MG/ML IJ SOLN
30.0000 mg/h | INTRAMUSCULAR | Status: DC
  Administered 2023-04-23: 30 mg/h via INTRAVENOUS
  Administered 2023-04-23: 20 mg/h via INTRAVENOUS
  Administered 2023-04-24 – 2023-04-25 (×4): 30 mg/h via INTRAVENOUS
  Filled 2023-04-23 (×7): qty 20

## 2023-04-23 SURGICAL SUPPLY — 9 items
CATH SWAN GANZ VIP 7.5F (CATHETERS) IMPLANT
CATH-GARD ARROW CATH SHIELD (MISCELLANEOUS) ×1
KIT MICROPUNCTURE NIT STIFF (SHEATH) IMPLANT
PACK CARDIAC CATHETERIZATION (CUSTOM PROCEDURE TRAY) ×1 IMPLANT
SHEATH PINNACLE 8F 10CM (SHEATH) IMPLANT
SHEATH PROBE COVER 6X72 (BAG) IMPLANT
SHIELD CATHGARD ARROW (MISCELLANEOUS) IMPLANT
TRANSDUCER W/STOPCOCK (MISCELLANEOUS) IMPLANT
TUBING ART PRESS 72 MALE/FEM (TUBING) IMPLANT

## 2023-04-23 NOTE — Progress Notes (Addendum)
Advanced Heart Failure Rounding Note  Cardiologist: Arvilla Meres, MD   Subjective:   Chief Complaint: dyspnea and fatigue   12/22: started on empiric milrinone 0.125 + high dose lasix, 160 IV x 2 12/23: s/p DCCV>>NSR. Milrinone increased to 0.25.   Developed increased O2 requirements overnight. CXR w/ vascular congestion and interstitial edema + layering bilateral pleural effusions. Given additional 160 IV lasix + 5 metolazone by overnight fellow.   UOP remains poor. Only 500 cc overnight.   Na 127 SCr down and stable past 24 hrs 2.91>>2.76>>2.76  K 3.9   Maintaining NSR. On amio gtt at 60/hr   On 7L [place]. No home baseline requirements. OOB sitting up in chair. Feels slightly better up in chair. Dyspnea improved w/ [place].     Objective:   Weight Range: 97.7 kg Body mass index is 27.65 kg/m.   Vital Signs:   Temp:  [97.4 F (36.3 C)-97.7 F (36.5 C)] 97.7 F (36.5 C) (12/24 0349) Pulse Rate:  [78-118] 83 (12/24 0837) Resp:  [14-27] 18 (12/24 0800) BP: (85-140)/(57-82) 140/66 (12/24 0837) SpO2:  [90 %-98 %] 90 % (12/24 0837) Weight:  [97.7 kg] 97.7 kg (12/24 0500) Last BM Date : 04/23/23  Weight change: Filed Weights   04/21/23 0519 04/22/23 0423 04/23/23 0500  Weight: 91 kg 92.2 kg 97.7 kg    Intake/Output:   Intake/Output Summary (Last 24 hours) at 04/23/2023 0839 Last data filed at 04/23/2023 0800 Gross per 24 hour  Intake 827.81 ml  Output 780 ml  Net 47.81 ml      Physical Exam    General:  sitting up in chair on [place] but no increased WOB. Fatigued/ pale looking   HEENT: normal Neck: supple. JVD elevated to jaw Carotids 2+ bilat; no bruits. No lymphadenopathy or thyromegaly appreciated. Cor: PMI nondisplaced. Regular rate & rhythm. No rubs, gallops or murmurs. Lungs: decreased BS at bases L>R  Abdomen: soft, nontender, nondistended. No hepatosplenomegaly. No bruits or masses. Good bowel sounds. Extremities: no cyanosis, clubbing, rash, 2-3+ b/l  LE pitting edema up to thighs  + unna boots Neuro: alert & oriented x 3, cranial nerves grossly intact. moves all 4 extremities w/o difficulty. Affect pleasant. GU: + foley    Telemetry   NSR 80s personally reviewed   EKG    Post DCCV EKG, NSR 98 bpm   Labs    CBC Recent Labs    04/22/23 0323 04/23/23 0326  WBC 13.4* 15.6*  HGB 9.2* 9.4*  HCT 28.3* 28.4*  MCV 80.4 79.6*  PLT 627* 771*   Basic Metabolic Panel Recent Labs    81/19/14 0323 04/22/23 2216 04/23/23 0326  NA 128* 129* 127*  K 3.7 4.4 3.9  CL 92* 91* 87*  CO2 24 25 25   GLUCOSE 145* 173* 155*  BUN 47* 51* 52*  CREATININE 2.91* 2.76* 2.76*  CALCIUM 7.8* 8.1* 8.1*  MG 2.2  --  2.1   Liver Function Tests Recent Labs    04/20/23 0847  AST 70*  ALT 45*  ALKPHOS 156*  BILITOT 1.3*  PROT 5.7*  ALBUMIN 2.0*   No results for input(s): "LIPASE", "AMYLASE" in the last 72 hours. Cardiac Enzymes No results for input(s): "CKTOTAL", "CKMB", "CKMBINDEX", "TROPONINI" in the last 72 hours.  BNP: BNP (last 3 results) Recent Labs    04/20/23 0432 04/20/23 2119 04/21/23 0921  BNP 2,698.9* 2,078.7* 1,292.0*    ProBNP (last 3 results) Recent Labs    09/25/22 0953  PROBNP  2,187*     D-Dimer No results for input(s): "DDIMER" in the last 72 hours. Hemoglobin A1C No results for input(s): "HGBA1C" in the last 72 hours. Fasting Lipid Panel No results for input(s): "CHOL", "HDL", "LDLCALC", "TRIG", "CHOLHDL", "LDLDIRECT" in the last 72 hours. Thyroid Function Tests Recent Labs    04/21/23 0921  TSH 3.735    Other results:   Imaging    DG CHEST PORT 1 VIEW Result Date: 04/23/2023 CLINICAL DATA:  Hypoxia EXAM: PORTABLE CHEST 1 VIEW COMPARISON:  04/19/2023 FINDINGS: Prior median sternotomy. Bilateral pacer/AICD again noted, unchanged. Cardiomegaly with vascular congestion. Interstitial prominence may reflect interstitial edema. Layering bilateral pleural effusions. Left base atelectasis.  IMPRESSION: 1. Cardiomegaly with vascular congestion and interstitial edema. 2. Layering bilateral pleural effusions. Electronically Signed   By: Charlett Nose M.D.   On: 04/23/2023 00:34   [place] EKG SITE RITE Result Date: 04/22/2023 If Site Rite image not attached, placement could not be confirmed due to current cardiac rhythm.  EP STUDY Result Date: 04/22/2023 See surgical note for result.    Medications:     Scheduled Medications:  acetaminophen  1,000 mg Oral TID   apixaban  5 mg Oral BID   atorvastatin  80 mg Oral Daily   calcium-vitamin D  1 tablet Oral BID WC   Chlorhexidine Gluconate Cloth  6 each Topical Daily   Gerhardt's butt cream   Topical BID   insulin aspart  0-15 Units Subcutaneous TID WC   insulin aspart  0-5 Units Subcutaneous QHS   leptospermum manuka honey  1 Application Topical Daily   midodrine  5 mg Oral TID WC   polyethylene glycol  17 g Oral Daily   senna-docusate  2 tablet Oral BID   sodium bicarbonate  650 mg Oral BID    Infusions:  amiodarone 60 mg/hr (04/23/23 0800)   furosemide Stopped (04/22/23 1835)   milrinone 0.25 mcg/kg/min (04/23/23 0800)    PRN Medications: magnesium hydroxide, methocarbamol, mouth rinse, oxyCODONE, zolpidem    Patient Profile   77 y.o. 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 Readmitted on 12/21 for recurrent afib w/ RVR and a/c CHF w/ voulume overload and low-output, requiring inotropic support w/ milrinone.   Assessment/Plan   1. Acute on chronic systolic heart failure: Advanced disease, previously required milrinone for diuresis during his last  admission.  Was discharged on midodrine without significant GDMT. Now readmitted w/ volume overload in setting of recurrent Afib w/ RVR  - poor urinary response to high dose IV Lasix despite empiric milrinone support, currently on 0.25 mcg/kg/min. He has marked edema on exam. CXR w/ pulmonary edema, now on high O2 requirements via [place]  - continue milrinone 0.25 - transition to lasix gtt at 20/hr + 5 of metolazone - if poor response today, will need RHC  - continue UNNA boots for edema  -GDMT limited by blood pressure, on midodrine 5 mg tid  -appears to be end-stage. Extremely poor long-term prognosis, does not appear to be in advanced therapies candidate. He is not interested in palliative care discussions at this time   2. Atrial fibrillation with RVR: failed recent DCCV. Back in AFib on admit, suspect triggered by worsening  volume overload and recent infection  - s/p DCCV 12/23, back in NSR  -Continue IV amiodarone at 60 mg/hr while on milrinone  -Eliquis 5 mg bid  -Management of infective component per hospitalist team  3. CAD: CABG 2018 - stable w/o CP  - apixaban, statin   4. 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 Diabetes: - per IM  - SSI   6. Severe malnutrition - albumin 1.7 on admit  - per IM     Length of Stay: 3  Knute Neu  04/23/2023, 8:39 AM  Advanced Heart Failure Team Pager [phone] (M-F; 7a - 5p)  Please contact [facility] for night-coverage after hours (5p -7a ) and weekends on [email or web address]  Agree with above.   Remains on milrinone. Feels poorly. Weak and SOB. Not diuresing well  General:  Weak appearing. + SOB HEENT: normal Neck: supple. JVP to ear Carotids 2+ bilat; no bruits. No lymphadenopathy or thryomegaly appreciated. Cor: Regular rate & rhythm. 2/6  TR Lungs: clear Abdomen: soft, nontender, nondistended. No hepatosplenomegaly. No bruits or masses. Good bowel sounds. Extremities: no cyanosis, clubbing, rash, 2-3+ edema R>L + UNNA boots Neuro: alert & orientedx3, cranial nerves grossly intact. moves all 4 extremities w/o difficulty. Affect pleasant  He is very tenuous. I am worried about his trajectory and the possibility of end-stage HF. Will plan RHC to clearly assess hemodynamics.  Continue milrinone for now.   CRITICAL CARE Performed by: Arvilla Meres  Total critical care time: 45 minutes  Critical care time was exclusive of separately billable procedures and treating other patients.  Critical care was necessary to treat or prevent imminent or life-threatening deterioration.  Critical care was time spent personally by me (independent of midlevel providers or residents) on the following activities: development of treatment plan with patient and/or surrogate as well as nursing, discussions with consultants, evaluation of patient's response to treatment, examination of patient, obtaining history from patient or surrogate, ordering and performing treatments and interventions, ordering and review of laboratory studies, ordering and review of radiographic studies, pulse oximetry and re-evaluation of patient's condition.

## 2023-04-23 NOTE — Progress Notes (Signed)
OT Cancellation Note  Patient Details Name: NAOL DIFABIO MRN: [id number] DOB: Nov 21, 1945   Cancelled Treatment:    Reason Eval/Treat Not Completed: Patient at procedure or test/ unavailable at cath lab will return as schedule allows.   Tyler Deis, OTR/L [facility] Acute Rehabilitation Office: [phone]   Myrla Halsted 04/23/2023, 1:22 PM

## 2023-04-23 NOTE — H&P (View-Only) (Signed)
Advanced Heart Failure Rounding Note  Cardiologist: Arvilla Meres, MD   Subjective:   Chief Complaint: dyspnea and fatigue   12/22: started on empiric milrinone 0.125 + high dose lasix, 160 IV x 2 12/23: s/p DCCV>>NSR. Milrinone increased to 0.25.   Developed increased O2 requirements overnight. CXR w/ vascular congestion and interstitial edema + layering bilateral pleural effusions. Given additional 160 IV lasix + 5 metolazone by overnight fellow.   UOP remains poor. Only 500 cc overnight.   Na 127 SCr down and stable past 24 hrs 2.91>>2.76>>2.76  K 3.9   Maintaining NSR. On amio gtt at 60/hr   On 7L [place]. No home baseline requirements. OOB sitting up in chair. Feels slightly better up in chair. Dyspnea improved w/ [place].     Objective:   Weight Range: 97.7 kg Body mass index is 27.65 kg/m.   Vital Signs:   Temp:  [97.4 F (36.3 C)-97.7 F (36.5 C)] 97.7 F (36.5 C) (12/24 0349) Pulse Rate:  [78-118] 83 (12/24 0837) Resp:  [14-27] 18 (12/24 0800) BP: (85-140)/(57-82) 140/66 (12/24 0837) SpO2:  [90 %-98 %] 90 % (12/24 0837) Weight:  [97.7 kg] 97.7 kg (12/24 0500) Last BM Date : 04/23/23  Weight change: Filed Weights   04/21/23 0519 04/22/23 0423 04/23/23 0500  Weight: 91 kg 92.2 kg 97.7 kg    Intake/Output:   Intake/Output Summary (Last 24 hours) at 04/23/2023 0839 Last data filed at 04/23/2023 0800 Gross per 24 hour  Intake 827.81 ml  Output 780 ml  Net 47.81 ml      Physical Exam    General:  sitting up in chair on [place] but no increased WOB. Fatigued/ pale looking   HEENT: normal Neck: supple. JVD elevated to jaw Carotids 2+ bilat; no bruits. No lymphadenopathy or thyromegaly appreciated. Cor: PMI nondisplaced. Regular rate & rhythm. No rubs, gallops or murmurs. Lungs: decreased BS at bases L>R  Abdomen: soft, nontender, nondistended. No hepatosplenomegaly. No bruits or masses. Good bowel sounds. Extremities: no cyanosis, clubbing, rash, 2-3+ b/l  LE pitting edema up to thighs  + unna boots Neuro: alert & oriented x 3, cranial nerves grossly intact. moves all 4 extremities w/o difficulty. Affect pleasant. GU: + foley    Telemetry   NSR 80s personally reviewed   EKG    Post DCCV EKG, NSR 98 bpm   Labs    CBC Recent Labs    04/22/23 0323 04/23/23 0326  WBC 13.4* 15.6*  HGB 9.2* 9.4*  HCT 28.3* 28.4*  MCV 80.4 79.6*  PLT 627* 771*   Basic Metabolic Panel Recent Labs    81/19/14 0323 04/22/23 2216 04/23/23 0326  NA 128* 129* 127*  K 3.7 4.4 3.9  CL 92* 91* 87*  CO2 24 25 25   GLUCOSE 145* 173* 155*  BUN 47* 51* 52*  CREATININE 2.91* 2.76* 2.76*  CALCIUM 7.8* 8.1* 8.1*  MG 2.2  --  2.1   Liver Function Tests Recent Labs    04/20/23 0847  AST 70*  ALT 45*  ALKPHOS 156*  BILITOT 1.3*  PROT 5.7*  ALBUMIN 2.0*   No results for input(s): "LIPASE", "AMYLASE" in the last 72 hours. Cardiac Enzymes No results for input(s): "CKTOTAL", "CKMB", "CKMBINDEX", "TROPONINI" in the last 72 hours.  BNP: BNP (last 3 results) Recent Labs    04/20/23 0432 04/20/23 2119 04/21/23 0921  BNP 2,698.9* 2,078.7* 1,292.0*    ProBNP (last 3 results) Recent Labs    09/25/22 0953  PROBNP  2,187*     D-Dimer No results for input(s): "DDIMER" in the last 72 hours. Hemoglobin A1C No results for input(s): "HGBA1C" in the last 72 hours. Fasting Lipid Panel No results for input(s): "CHOL", "HDL", "LDLCALC", "TRIG", "CHOLHDL", "LDLDIRECT" in the last 72 hours. Thyroid Function Tests Recent Labs    04/21/23 0921  TSH 3.735    Other results:   Imaging    DG CHEST PORT 1 VIEW Result Date: 04/23/2023 CLINICAL DATA:  Hypoxia EXAM: PORTABLE CHEST 1 VIEW COMPARISON:  04/19/2023 FINDINGS: Prior median sternotomy. Bilateral pacer/AICD again noted, unchanged. Cardiomegaly with vascular congestion. Interstitial prominence may reflect interstitial edema. Layering bilateral pleural effusions. Left base atelectasis.  IMPRESSION: 1. Cardiomegaly with vascular congestion and interstitial edema. 2. Layering bilateral pleural effusions. Electronically Signed   By: Charlett Nose M.D.   On: 04/23/2023 00:34   [place] EKG SITE RITE Result Date: 04/22/2023 If Site Rite image not attached, placement could not be confirmed due to current cardiac rhythm.  EP STUDY Result Date: 04/22/2023 See surgical note for result.    Medications:     Scheduled Medications:  acetaminophen  1,000 mg Oral TID   apixaban  5 mg Oral BID   atorvastatin  80 mg Oral Daily   calcium-vitamin D  1 tablet Oral BID WC   Chlorhexidine Gluconate Cloth  6 each Topical Daily   Gerhardt's butt cream   Topical BID   insulin aspart  0-15 Units Subcutaneous TID WC   insulin aspart  0-5 Units Subcutaneous QHS   leptospermum manuka honey  1 Application Topical Daily   midodrine  5 mg Oral TID WC   polyethylene glycol  17 g Oral Daily   senna-docusate  2 tablet Oral BID   sodium bicarbonate  650 mg Oral BID    Infusions:  amiodarone 60 mg/hr (04/23/23 0800)   furosemide Stopped (04/22/23 1835)   milrinone 0.25 mcg/kg/min (04/23/23 0800)    PRN Medications: magnesium hydroxide, methocarbamol, mouth rinse, oxyCODONE, zolpidem    Patient Profile   77 y.o. 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 Readmitted on 12/21 for recurrent afib w/ RVR and a/c CHF w/ voulume overload and low-output, requiring inotropic support w/ milrinone.   Assessment/Plan   1. Acute on chronic systolic heart failure: Advanced disease, previously required milrinone for diuresis during his last  admission.  Was discharged on midodrine without significant GDMT. Now readmitted w/ volume overload in setting of recurrent Afib w/ RVR  - poor urinary response to high dose IV Lasix despite empiric milrinone support, currently on 0.25 mcg/kg/min. He has marked edema on exam. CXR w/ pulmonary edema, now on high O2 requirements via [place]  - continue milrinone 0.25 - transition to lasix gtt at 20/hr + 5 of metolazone - if poor response today, will need RHC  - continue UNNA boots for edema  -GDMT limited by blood pressure, on midodrine 5 mg tid  -appears to be end-stage. Extremely poor long-term prognosis, does not appear to be in advanced therapies candidate. He is not interested in palliative care discussions at this time   2. Atrial fibrillation with RVR: failed recent DCCV. Back in AFib on admit, suspect triggered by worsening  volume overload and recent infection  - s/p DCCV 12/23, back in NSR  -Continue IV amiodarone at 60 mg/hr while on milrinone  -Eliquis 5 mg bid  -Management of infective component per hospitalist team  3. CAD: CABG 2018 - stable w/o CP  - apixaban, statin   4. 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 Diabetes: - per IM  - SSI   6. Severe malnutrition - albumin 1.7 on admit  - per IM     Length of Stay: 3  [name] [name]  04/23/2023, 8:39 AM  Advanced Heart Failure Team Pager [phone] (M-F; 7a - 5p)  Please contact [facility] for night-coverage after hours (5p -7a ) and weekends on [email or web address]  Agree with above.   Remains on milrinone. Feels poorly. Weak and SOB. Not diuresing well  General:  Weak appearing. + SOB HEENT: normal Neck: supple. JVP to ear Carotids 2+ bilat; no bruits. No lymphadenopathy or thryomegaly appreciated. Cor: Regular rate & rhythm. 2/6  TR Lungs: clear Abdomen: soft, nontender, nondistended. No hepatosplenomegaly. No bruits or masses. Good bowel sounds. Extremities: no cyanosis, clubbing, rash, 2-3+ edema R>L + UNNA boots Neuro: alert & orientedx3, cranial nerves grossly intact. moves all 4 extremities w/o difficulty. Affect pleasant  He is very tenuous. I am worried about his trajectory and the possibility of end-stage HF. Will plan RHC to clearly assess hemodynamics.  Continue milrinone for now.   CRITICAL CARE Performed by: Arvilla Meres  Total critical care time: 45 minutes  Critical care time was exclusive of separately billable procedures and treating other patients.  Critical care was necessary to treat or prevent imminent or life-threatening deterioration.  Critical care was time spent personally by me (independent of midlevel providers or residents) on the following activities: development of treatment plan with patient and/or surrogate as well as nursing, discussions with consultants, evaluation of patient's response to treatment, examination of patient, obtaining history from patient or surrogate, ordering and performing treatments and interventions, ordering and review of laboratory studies, ordering and review of radiographic studies, pulse oximetry and re-evaluation of patient's condition.

## 2023-04-23 NOTE — Progress Notes (Signed)
Inpatient Rehab Admissions Coordinator:   Pt from CIR and recs to return.  I will place a consult order per our protocol.   Estill Dooms, PT, DPT Admissions Coordinator [phone] 04/23/23  11:35 AM

## 2023-04-23 NOTE — Progress Notes (Signed)
PROGRESS NOTE    [name] [name]  ZHY:[id number] DOB: 06/14/1945 DOA: 04/20/2023 PCP: [name], MD   77 y.o. male with COPD, CAD, CABG 2018, ischemic cardiomyopathy EF 20-25%, paroxysmal A-fib, hypertension, type 2 diabetes mellitus, history of LV thrombus.   -Admitted 11/4 w/syncope fall, Afib>cardioverted 11/7, discharged 11/8 -Readmitted 11/25 with increased right hip pain, imaging noted retroperitoneal hematoma extending to distal psoas and iliacus and also volume overloaded, diuresed, followed by nephrology and cards then, started on midodrine, discharged to rehab 12/9 on torsemide -In rehab he had worsening leukocytosis up to 43K, infectious disease consulted 12/10, on 12/12 underwent ultrasound-guided aspiration of right hip, 30 cc of turbid brownish fluid drained, Cx w LACTICASEIBACILLUS CASEI, he was treated with few days of IV vancomycin which does not have efficacy against this, leukocytosis largely spontaneously started improving, vancomycin discontinued 12/16.  Last 2 days had increased shortness of breath, elevated heart rate etc.  -12/21: Readmitted from CIR back to inpatient, 2H with A-fib RVR and CHF -12/21, heart failure team consulted, started on milrinone, Lasix dose increased -12/23 cardioverted to sinus rhythm -12/24: Poor response to diuretics  Subjective: -Denies significant symptoms, but remains in bed, no significant diuresis  Assessment and Plan:  Acute on chronic systolic CHF, ischemic cardiomyopathy Low output state -Last echo 11/24 with EF 20-25%, mildly reduced RV -volume overloaded, compounded by severe hypoalbuminemia (1.7-2) and third spacing -CHF team following, on Lasix and milrinone gtt., poor response to diuretics, cardioverted yesterday -CHF team following, prognosis appears to be poor   Atrial fibrillation RVR -On amiodarone, prior cardioversions -Continue IV amiodarone -Per cardiology, sp DCCV 12/23 -Anticoagulation resumed -on  apixaban   Retroperitoneal hematoma extending to distal psoas and iliacus -Following mechanical fall -Had profound leukocytosis around 40K in CIR,12/12 underwent ultrasound-guided aspiration of right hip, 30 cc of turbid brownish fluid drained, Cx w LACTICASEIBACILLUS CASEI, he was treated with few days of IV vancomycin which has no activity against this, leukocytosis largely spontaneously started improving, vancomycin discontinued 12/16, ID signed off -Mild leukocytosis and thrombocytosis persists, afebrile and nontoxic, no abdominal symptoms, monitor   Leukocytosis -See discussion above   CAD  Hx CABG 2018 -Stable   AKI, CKD 3B/4 -Baseline creatinine around 2-2.2 -With low output state, creatinine worsening, see above   Chronic anemia -Relatively stable   Type 2 diabetes mellitus -Recent A1c was 6.9 -On Ozempic at baseline, off metformin now -CBGs are stable, continue SSI,   Severe hypoalbuminemia -No clear history of cirrhosis -?  Malnutrition alone versus combination with chronic liver disease -Dietitian consult   GOALS: 77/M with severe cardiomyopathy, A-fib RVR, severe hypoalbuminemia and third spacing, retroperitoneal hematoma, poor response to milrinone and high-dose Lasix, prognosis is poor -Anticipate need for palliative care eval this admission  DVT prophylaxis: Apixaban Code Status: Full code Family Communication: Wife at bedside Disposition Plan: Remain in ICU  Consultants:    Procedures:   Antimicrobials:    Objective: Vitals:   04/23/23 0700 04/23/23 0800 04/23/23 0837 04/23/23 0839  BP: 131/65 135/66 (!) 140/66   Pulse: 81 82 83   Resp: 20 18    Temp:    97.9 F (36.6 C)  TempSrc:    Oral  SpO2: 94% 94% 90%   Weight:      Height:        Intake/Output Summary (Last 24 hours) at 04/23/2023 1111 Last data filed at 04/23/2023 0800 Gross per 24 hour  Intake 811.03 ml  Output 785 ml  Net 26.03  ml   Filed Weights   04/21/23 0519 04/22/23  0423 04/23/23 0500  Weight: 91 kg 92.2 kg 97.7 kg    Examination:  General exam: Chronically ill male sitting up in bed, AAO x 3 HEENT: Positive JVD CVS: S1-S2, regular rhythm Lungs: Bilateral Rales Abdomen: Soft, nontender, bowel sounds present Extremities: 1-2+ edema Psychiatry:  Mood & affect appropriate.     Data Reviewed:   CBC: Recent Labs  Lab 04/18/23 0554 04/19/23 1037 04/20/23 0847 04/21/23 0921 04/22/23 0323 04/23/23 0326  WBC 13.1* 13.8* 16.0* 15.4* 13.4* 15.6*  NEUTROABS 11.3* 12.0*  --   --   --   --   HGB 10.0* 10.3* 10.5* 10.0* 9.2* 9.4*  HCT 30.1* 31.4* 32.3* 29.8* 28.3* 28.4*  MCV 80.1 80.7 81.0 80.1 80.4 79.6*  PLT 662* 645* 662* 720* 627* 771*   Basic Metabolic Panel: Recent Labs  Lab 04/19/23 0512 04/20/23 0847 04/20/23 2119 04/21/23 0921 04/22/23 0323 04/22/23 2216 04/23/23 0326  NA 131*   < > 130* 129* 128* 129* 127*  K 4.0   < > 4.2 3.8 3.7 4.4 3.9  CL 94*   < > 92* 91* 92* 91* 87*  CO2 28   < > 25 25 24 25 25   GLUCOSE 111*   < > 173* 153* 145* 173* 155*  BUN 36*   < > 44* 46* 47* 51* 52*  CREATININE 2.30*   < > 2.75* 2.56* 2.91* 2.76* 2.76*  CALCIUM 8.0*   < > 8.0* 8.1* 7.8* 8.1* 8.1*  MG 1.9  --   --  2.0 2.2  --  2.1  PHOS 5.0*  --   --   --   --   --   --    < > = values in this interval not displayed.   GFR: Estimated Creatinine Clearance: 26.1 mL/min (A) (by C-G formula based on SCr of 2.76 mg/dL (H)). Liver Function Tests: Recent Labs  Lab 04/18/23 0554 04/20/23 0847  AST 46* 70*  ALT 41 45*  ALKPHOS 112 156*  BILITOT 1.2* 1.3*  PROT 4.7* 5.7*  ALBUMIN 1.7* 2.0*   No results for input(s): "LIPASE", "AMYLASE" in the last 168 hours. No results for input(s): "AMMONIA" in the last 168 hours. Coagulation Profile: No results for input(s): "INR", "PROTIME" in the last 168 hours. Cardiac Enzymes: No results for input(s): "CKTOTAL", "CKMB", "CKMBINDEX", "TROPONINI" in the last 168 hours. BNP (last 3 results) Recent  Labs    09/25/22 0953  PROBNP 2,187*   HbA1C: No results for input(s): "HGBA1C" in the last 72 hours. CBG: Recent Labs  Lab 04/22/23 1129 04/22/23 1547 04/22/23 2201 04/23/23 0607 04/23/23 0835  GLUCAP 143* 195* 179* 178* 145*   Lipid Profile: No results for input(s): "CHOL", "HDL", "LDLCALC", "TRIG", "CHOLHDL", "LDLDIRECT" in the last 72 hours. Thyroid Function Tests: Recent Labs    04/21/23 0921  TSH 3.735   Anemia Panel: No results for input(s): "VITAMINB12", "FOLATE", "FERRITIN", "TIBC", "IRON", "RETICCTPCT" in the last 72 hours. Urine analysis:    Component Value Date/Time   COLORURINE YELLOW 04/07/2023 1810   APPEARANCEUR CLEAR 04/07/2023 1810   LABSPEC 1.009 04/07/2023 1810   PHURINE 5.0 04/07/2023 1810   GLUCOSEU NEGATIVE 04/07/2023 1810   HGBUR NEGATIVE 04/07/2023 1810   BILIRUBINUR NEGATIVE 04/07/2023 1810   KETONESUR NEGATIVE 04/07/2023 1810   PROTEINUR NEGATIVE 04/07/2023 1810   NITRITE NEGATIVE 04/07/2023 1810   LEUKOCYTESUR NEGATIVE 04/07/2023 1810   Sepsis Labs: @LABRCNTIP (procalcitonin:4,lacticidven:4)  ) Recent Results (  from the past 240 hours)  MRSA Next Gen by PCR, Nasal     Status: None   Collection Time: 04/20/23 10:29 AM   Specimen: Nasal Mucosa; Nasal Swab  Result Value Ref Range Status   MRSA by PCR Next Gen NOT DETECTED NOT DETECTED Final    Comment: (NOTE) The GeneXpert MRSA Assay (FDA approved for NASAL specimens only), is one component of a comprehensive MRSA colonization surveillance program. It is not intended to diagnose MRSA infection nor to guide or monitor treatment for MRSA infections. Test performance is not FDA approved in patients less than 38 years old. Performed at [facility], [address] [place], [place] [address]      Radiology Studies: DG CHEST PORT 1 VIEW Result Date: 04/23/2023 CLINICAL DATA:  Hypoxia EXAM: PORTABLE CHEST 1 VIEW COMPARISON:  04/19/2023 FINDINGS: Prior median sternotomy.  Bilateral pacer/AICD again noted, unchanged. Cardiomegaly with vascular congestion. Interstitial prominence may reflect interstitial edema. Layering bilateral pleural effusions. Left base atelectasis. IMPRESSION: 1. Cardiomegaly with vascular congestion and interstitial edema. 2. Layering bilateral pleural effusions. Electronically Signed   By: Charlett Nose M.D.   On: 04/23/2023 00:34   [place] EKG SITE RITE Result Date: 04/22/2023 If Site Rite image not attached, placement could not be confirmed due to current cardiac rhythm.  EP STUDY Result Date: 04/22/2023 See surgical note for result.    Scheduled Meds:  acetaminophen  1,000 mg Oral TID   apixaban  5 mg Oral BID   atorvastatin  80 mg Oral Daily   calcium-vitamin D  1 tablet Oral BID WC   Chlorhexidine Gluconate Cloth  6 each Topical Daily   Gerhardt's butt cream   Topical BID   insulin aspart  0-15 Units Subcutaneous TID WC   insulin aspart  0-5 Units Subcutaneous QHS   leptospermum manuka honey  1 Application Topical Daily   midodrine  5 mg Oral TID WC   polyethylene glycol  17 g Oral Daily   senna-docusate  2 tablet Oral BID   sodium bicarbonate  650 mg Oral BID   Continuous Infusions:  amiodarone 60 mg/hr (04/23/23 1039)   furosemide (LASIX) 200 mg in dextrose 5 % 100 mL (2 mg/mL) infusion     milrinone 0.25 mcg/kg/min (04/23/23 0800)     LOS: 3 days    Time spent:    Zannie Cove, MD [facility]   04/23/2023, 11:11 AM

## 2023-04-23 NOTE — Interval H&P Note (Signed)
History and Physical Interval Note:  04/23/2023 12:13 PM  [name] [name]  has presented today for surgery, with the diagnosis of congestive heart failure.  The various methods of treatment have been discussed with the patient and family. After consideration of risks, benefits and other options for treatment, the patient has consented to  Procedure(s): RIGHT HEART CATH (N/A) as a surgical intervention.  The patient's history has been reviewed, patient examined, no change in status, stable for surgery.  I have reviewed the patient's chart and labs.  Questions were answered to the patient's satisfaction.     [name]

## 2023-04-23 NOTE — Progress Notes (Signed)
Occupational Therapy Discharge Summary  Patient Details  Name: [name] [name] MRN: [id number] Date of Birth: 12/15/1945 Occupational Therapy Discharge Note  This patient was unable to complete the inpatient rehab program due to readmission to ICU with a-fib/RVR; therefore did not meet their long term goals. Pt left the program at a mod assist level for their  functional ADLs. This patient is being discharged from OT services at this time.  BIMS at time of d/c- Pt unable to complete due to medical status  See CareTool for functional status details.  If the patient is able to return to inpatient rehabilitation within 3 midnights, this may be considered an interrupted stay and therapy services will resume as ordered. Modification and reinstatement of their goals will be made upon completion of therapy service reevaluations.    [name] 04/23/2023, 6:50 AM

## 2023-04-23 NOTE — Evaluation (Signed)
Physical Therapy Evaluation Patient Details Name: [name] [name] MRN: [id number] DOB: 1946-04-27 Today's Date: 04/23/2023  History of Present Illness  77 y.o. male admitted 04/20/23 from AIR with Afib with RVR and decompensated HF. 12/23 DCCV. PMHx: Admit 03/25/2023-12/9 with Rt hip pain, retroperitoneal hematoma. Admit 11/5-11/8 s/p fall with Rt clavicle fx.  AICD, bladder CA, CHF, CAD, T2DM, PAF, PNA.  Clinical Impression  Pt pleasant and willing to mobilize. Pt was walking on AIR and now presents with significant decline in strength, transfers and balance. Pt on 7-8L HFNC throughout session with SPO2 90-95% with cues for breathing technique and pt limited by fatigue. Pt will benefit from acute therapy to maximize mobility, safety and independence to decrease burden of care. Patient will benefit from intensive inpatient follow up therapy, >3 hours/day  Before activity 135/66 Post activity 140/66 HR   84      If plan is discharge home, recommend the following: Assist for transportation;Help with stairs or ramp for entrance;A lot of help with walking and/or transfers;Assistance with cooking/housework;A lot of help with bathing/dressing/bathroom   Can travel by private vehicle        Equipment Recommendations None recommended by PT  Recommendations for Other Services  Rehab consult;OT consult    Functional Status Assessment Patient has had a recent decline in their functional status and demonstrates the ability to make significant improvements in function in a reasonable and predictable amount of time.     Precautions / Restrictions Precautions Precautions: Fall;Other (comment) Precaution Comments: SPO2 and HR Restrictions Weight Bearing Restrictions Per Provider Order: Yes RUE Weight Bearing Per Provider Order: Weight bearing as tolerated      Mobility  Bed Mobility Overal bed mobility: Needs Assistance Bed Mobility: Rolling, Sidelying to Sit Rolling: Min  assist Sidelying to sit: Mod assist       General bed mobility comments: assist to clear legs from surface and elevate trunk to rise to sitting, pt able to scoot without assist. INcreased time to gain static sitting balance.    Transfers Overall transfer level: Needs assistance   Transfers: Sit to/from Stand Sit to Stand: Min assist, +2 safety/equipment Stand pivot transfers: Min assist, +2 physical assistance         General transfer comment: min assist to rise from elevated surface, cues for hand and feet placement. RW present with pt stabilizing with RW in standing. step pivot to chair with mod cues and pt fatigued after standing. repeated standing from chair with armrests with assist to rise and pt tolerating grossly 30 sec with cues for knee extension prior to fatigue and need to sit    Ambulation/Gait               General Gait Details: not yet able  Stairs            Wheelchair Mobility     Tilt Bed    Modified Rankin (Stroke Patients Only)       Balance Overall balance assessment: Needs assistance, History of Falls Sitting-balance support: Feet supported, Bilateral upper extremity supported Sitting balance-Leahy Scale: Poor Sitting balance - Comments: EOB with UB support   Standing balance support: Reliant on assistive device for balance, During functional activity, Bilateral upper extremity supported Standing balance-Leahy Scale: Poor Standing balance comment: Rw in standing and physical assist                             Pertinent Vitals/Pain Pain Assessment  Faces Pain Scale: Hurts little more Pain Location: Rt hip and sacrum Pain Descriptors / Indicators: Aching, Sore Pain Intervention(s): Limited activity within patient's tolerance, Monitored during session, Repositioned    Home Living Family/patient expects to be discharged to:: Private residence Living Arrangements: Spouse/significant other Available Help at Discharge:  Family;Available 24 hours/day Type of Home: House Home Access: Stairs to enter   Entrance Stairs-Number of Steps: 2 Alternate Level Stairs-Number of Steps: flight, stair lift to be put in mid Jan   Home Equipment: Agricultural consultant (2 wheels);Cane - single point;Shower seat      Prior Function Prior Level of Function : Independent/Modified Independent;History of Falls (last six months)             Mobility Comments: on AIR was walking with RW and assist, prior to november walking with RW with falls ADLs Comments: assist for ADLs since admission, independent prior     Extremity/Trunk Assessment   Upper Extremity Assessment Upper Extremity Assessment: Generalized weakness    Lower Extremity Assessment Lower Extremity Assessment: Generalized weakness    Cervical / Trunk Assessment Cervical / Trunk Assessment: Kyphotic  Communication   Communication Communication: No apparent difficulties  Cognition Arousal: Alert Behavior During Therapy: WFL for tasks assessed/performed Overall Cognitive Status: Within Functional Limits for tasks assessed                           Safety/Judgement: Decreased awareness of deficits   Problem Solving: Requires verbal cues, Requires tactile cues          General Comments      Exercises     Assessment/Plan    PT Assessment Patient needs continued PT services  PT Problem List Decreased strength;Decreased range of motion;Decreased activity tolerance;Decreased balance;Decreased mobility;Decreased coordination;Pain       PT Treatment Interventions DME instruction;Gait training;Stair training;Functional mobility training;Therapeutic activities;Therapeutic exercise;Balance training;Neuromuscular re-education;Patient/family education    PT Goals (Current goals can be found in the Care Plan section)  Acute Rehab PT Goals Patient Stated Goal: return to AIR prior to home PT Goal Formulation: With patient/family Time For Goal  Achievement: 05/07/23 Potential to Achieve Goals: Good    Frequency Min 1X/week     Co-evaluation               AM-PAC PT "6 Clicks" Mobility  Outcome Measure Help needed turning from your back to your side while in a flat bed without using bedrails?: A Little Help needed moving from lying on your back to sitting on the side of a flat bed without using bedrails?: A Lot Help needed moving to and from a bed to a chair (including a wheelchair)?: A Lot Help needed standing up from a chair using your arms (e.g., wheelchair or bedside chair)?: A Lot Help needed to walk in hospital room?: Total Help needed climbing 3-5 steps with a railing? : Total 6 Click Score: 11    End of Session Equipment Utilized During Treatment: Gait belt;Oxygen Activity Tolerance: Patient limited by fatigue Patient left: in chair;with call bell/phone within reach;with chair alarm set;with nursing/sitter in room Nurse Communication: Mobility status;Need for lift equipment (stedy) PT Visit Diagnosis: Unsteadiness on feet (R26.81);Repeated falls (R29.6);Other abnormalities of gait and mobility (R26.89)    Time: 7829-5621 PT Time Calculation (min) (ACUTE ONLY): 24 min   Charges:   PT Evaluation $PT Eval Moderate Complexity: 1 Mod PT Treatments $Therapeutic Activity: 8-22 mins PT General Charges $$ ACUTE PT VISIT: 1 Visit  Merryl Hacker, PT Acute Rehabilitation Services Office: [phone]   Cristine Polio 04/23/2023, 8:44 AM

## 2023-04-23 NOTE — Plan of Care (Signed)
  Problem: Education: Goal: Knowledge of General Education information will improve Description: Including pain rating scale, medication(s)/side effects and non-pharmacologic comfort measures Outcome: Progressing   Problem: Health Behavior/Discharge Planning: Goal: Ability to manage health-related needs will improve Outcome: Progressing   Problem: Clinical Measurements: Goal: Ability to maintain clinical measurements within normal limits will improve Outcome: Progressing Goal: Will remain free from infection Outcome: Progressing Goal: Diagnostic test results will improve Outcome: Progressing Goal: Respiratory complications will improve Outcome: Progressing Goal: Cardiovascular complication will be avoided Outcome: Progressing   Problem: Activity: Goal: Risk for activity intolerance will decrease Outcome: Progressing   Problem: Nutrition: Goal: Adequate nutrition will be maintained Outcome: Progressing   Problem: Coping: Goal: Level of anxiety will decrease Outcome: Progressing   Problem: Elimination: Goal: Will not experience complications related to bowel motility Outcome: Progressing Goal: Will not experience complications related to urinary retention Outcome: Progressing   Problem: Pain Management: Goal: General experience of comfort will improve Outcome: Progressing   Problem: Safety: Goal: Ability to remain free from injury will improve Outcome: Progressing   Problem: Skin Integrity: Goal: Risk for impaired skin integrity will decrease Outcome: Progressing   Problem: Education: Goal: Knowledge of disease or condition will improve Outcome: Progressing Goal: Understanding of medication regimen will improve Outcome: Progressing Goal: Individualized Educational Video(s) Outcome: Progressing   Problem: Activity: Goal: Ability to tolerate increased activity will improve Outcome: Progressing   Problem: Cardiac: Goal: Ability to achieve and maintain  adequate cardiopulmonary perfusion will improve Outcome: Progressing   Problem: Health Behavior/Discharge Planning: Goal: Ability to safely manage health-related needs after discharge will improve Outcome: Progressing   Problem: Education: Goal: Ability to describe self-care measures that may prevent or decrease complications (Diabetes Survival Skills Education) will improve Outcome: Progressing Goal: Individualized Educational Video(s) Outcome: Progressing   Problem: Coping: Goal: Ability to adjust to condition or change in health will improve Outcome: Progressing   Problem: Fluid Volume: Goal: Ability to maintain a balanced intake and output will improve Outcome: Progressing   Problem: Health Behavior/Discharge Planning: Goal: Ability to identify and utilize available resources and services will improve Outcome: Progressing Goal: Ability to manage health-related needs will improve Outcome: Progressing   Problem: Metabolic: Goal: Ability to maintain appropriate glucose levels will improve Outcome: Progressing   Problem: Nutritional: Goal: Maintenance of adequate nutrition will improve Outcome: Progressing Goal: Progress toward achieving an optimal weight will improve Outcome: Progressing   Problem: Skin Integrity: Goal: Risk for impaired skin integrity will decrease Outcome: Progressing   Problem: Tissue Perfusion: Goal: Adequacy of tissue perfusion will improve Outcome: Progressing

## 2023-04-24 LAB — CBC
HCT: 26.3 % — ABNORMAL LOW (ref 39.0–52.0)
HCT: 25.2 % — ABNORMAL LOW (ref 39.0–52.0)
Hemoglobin: 8.5 g/dL — ABNORMAL LOW (ref 13.0–17.0)
Hemoglobin: 8.2 g/dL — ABNORMAL LOW (ref 13.0–17.0)
MCH: 25.8 pg — ABNORMAL LOW (ref 26.0–34.0)
MCH: 25.9 pg — ABNORMAL LOW (ref 26.0–34.0)
MCHC: 32.3 g/dL (ref 30.0–36.0)
MCHC: 32.5 g/dL (ref 30.0–36.0)
MCV: 79.9 fL — ABNORMAL LOW (ref 80.0–100.0)
MCV: 79.5 fL — ABNORMAL LOW (ref 80.0–100.0)
Platelets: 671 10*3/uL — ABNORMAL HIGH (ref 150–400)
Platelets: 657 10*3/uL — ABNORMAL HIGH (ref 150–400)
RBC: 3.29 MIL/uL — ABNORMAL LOW (ref 4.22–5.81)
RBC: 3.17 MIL/uL — ABNORMAL LOW (ref 4.22–5.81)
RDW: 20.2 % — ABNORMAL HIGH (ref 11.5–15.5)
RDW: 19.9 % — ABNORMAL HIGH (ref 11.5–15.5)
WBC: 12.9 10*3/uL — ABNORMAL HIGH (ref 4.0–10.5)
WBC: 15.6 10*3/uL — ABNORMAL HIGH (ref 4.0–10.5)
nRBC: 0 % (ref 0.0–0.2)
nRBC: 0 % (ref 0.0–0.2)

## 2023-04-24 LAB — POCT I-STAT EG7
Acid-Base Excess: 3 mmol/L — ABNORMAL HIGH (ref 0.0–2.0)
Bicarbonate: 27.8 mmol/L (ref 20.0–28.0)
Calcium, Ion: 1.08 mmol/L — ABNORMAL LOW (ref 1.15–1.40)
HCT: 30 % — ABNORMAL LOW (ref 39.0–52.0)
Hemoglobin: 10.2 g/dL — ABNORMAL LOW (ref 13.0–17.0)
O2 Saturation: 42 %
Patient temperature: 36.4
Potassium: 3.8 mmol/L (ref 3.5–5.1)
Sodium: 125 mmol/L — ABNORMAL LOW (ref 135–145)
TCO2: 29 mmol/L (ref 22–32)
pCO2, Ven: 41.7 mm[Hg] — ABNORMAL LOW (ref 44–60)
pH, Ven: 7.429 (ref 7.25–7.43)
pO2, Ven: 22 mm[Hg] — CL (ref 32–45)

## 2023-04-24 LAB — COOXEMETRY PANEL
Carboxyhemoglobin: 1.4 % (ref 0.5–1.5)
Methemoglobin: <0.7 % (ref 0.0–1.5)
O2 Saturation: 63.3 %
Total hemoglobin: 8.8 g/dL — ABNORMAL LOW (ref 12.0–16.0)

## 2023-04-24 LAB — GLUCOSE, CAPILLARY
Glucose-Capillary: 126 mg/dL — ABNORMAL HIGH (ref 70–99)
Glucose-Capillary: 161 mg/dL — ABNORMAL HIGH (ref 70–99)
Glucose-Capillary: 162 mg/dL — ABNORMAL HIGH (ref 70–99)
Glucose-Capillary: 178 mg/dL — ABNORMAL HIGH (ref 70–99)

## 2023-04-24 LAB — BASIC METABOLIC PANEL
Anion gap: 13 (ref 5–15)
BUN: 55 mg/dL — ABNORMAL HIGH (ref 8–23)
CO2: 26 mmol/L (ref 22–32)
Calcium: 8 mg/dL — ABNORMAL LOW (ref 8.9–10.3)
Chloride: 88 mmol/L — ABNORMAL LOW (ref 98–111)
Creatinine, Ser: 3.32 mg/dL — ABNORMAL HIGH (ref 0.61–1.24)
GFR, Estimated: 18 mL/min — ABNORMAL LOW (ref >60)
Glucose, Bld: 127 mg/dL — ABNORMAL HIGH (ref 70–99)
Potassium: 3.8 mmol/L (ref 3.5–5.1)
Sodium: 127 mmol/L — ABNORMAL LOW (ref 135–145)

## 2023-04-24 LAB — MAGNESIUM: Magnesium: 2 mg/dL (ref 1.7–2.4)

## 2023-04-24 MED ORDER — NOREPINEPHRINE 4 MG/250ML-% IV SOLN
0.0000 ug/min | INTRAVENOUS | Status: DC
  Administered 2023-04-24: 2 ug/min via INTRAVENOUS
  Administered 2023-04-25: 3 ug/min via INTRAVENOUS
  Administered 2023-04-26: 4 ug/min via INTRAVENOUS
  Filled 2023-04-24 (×3): qty 250

## 2023-04-24 MED ORDER — POTASSIUM CHLORIDE CRYS ER 20 MEQ PO TBCR
40.0000 meq | EXTENDED_RELEASE_TABLET | Freq: Once | ORAL | Status: AC
  Administered 2023-04-24: 40 meq via ORAL
  Filled 2023-04-24: qty 2

## 2023-04-24 MED ORDER — SODIUM CHLORIDE 0.9 % IV SOLN
250.0000 mL | INTRAVENOUS | Status: AC
Start: 2023-04-24 — End: 2023-04-25

## 2023-04-24 MED ORDER — NOREPINEPHRINE 4 MG/250ML-% IV SOLN: 3.0000 ug/min | INTRAVENOUS | Status: DC

## 2023-04-24 NOTE — Progress Notes (Addendum)
Advanced Heart Failure Rounding Note  Cardiologist: Arvilla Meres, MD   Subjective:   Chief Complaint: dyspnea and fatigue   12/22: started on empiric milrinone 0.125 + high dose lasix, 160 IV x 2 12/23: s/p DCCV>>NSR. Milrinone increased to 0.25.   Had RHC yesterday On milrinone 0.25 mcg/kg/min   RA = 14 RV = 58/18 PA = 56/25 (40) PCW = 28 with v waves to 45 Fick cardiac output/index = 5.8/2.6 TD CO/CI = 4.7/2.1 PVR = 2.1 (Fick) 2.6 (TD) Ao sat = 97% PA sat = 46%, 48% PAPi = 2.2  Lasix gtt increased to 30 minimal urine output. SCr 2.76 -> 3.32  Feels weak Denies CP or SOB.   RA 15  PA 51/23 (35) PCWP 26 (v = 38) TD CO/Ci  5.25/2.34 PAPI 1.8 SVR 1000   Objective:   Weight Range: 94.4 kg Body mass index is 26.72 kg/m.   Vital Signs:   Temp:  [96.8 F (36 C)-97.5 F (36.4 C)] 97.3 F (36.3 C) (12/25 1315) Pulse Rate:  [70-81] 70 (12/25 1315) Resp:  [9-24] 10 (12/25 1315) BP: (102-136)/(47-81) 122/58 (12/25 1315) SpO2:  [89 %-100 %] 96 % (12/25 1315) Weight:  [94.4 kg-97.1 kg] 94.4 kg (12/25 0600) Last BM Date : 04/23/23  Weight change: Filed Weights   04/23/23 0500 04/24/23 0444 04/24/23 0600  Weight: 97.7 kg 97.1 kg 94.4 kg    Intake/Output:   Intake/Output Summary (Last 24 hours) at 04/24/2023 1347 Last data filed at 04/24/2023 1300 Gross per 24 hour  Intake 2122.47 ml  Output 520 ml  Net 1602.47 ml      Physical Exam    General:  Lying flat in bed. Chronically ill appearing No resp difficulty HEENT: normal Neck: supple. RIJ swan Carotids 2+ bilat; no bruits. No lymphadenopathy or thryomegaly appreciated. Cor: . Regular rate & rhythm. No rubs, gallops or murmurs. Lungs: clear Abdomen: soft, nontender, nondistended. No hepatosplenomegaly. No bruits or masses. Good bowel sounds. Extremities: no cyanosis, clubbing, rash, 2-3+ edema into thighs. +UNNA boots Neuro: alert & orientedx3, cranial nerves grossly intact. moves all 4  extremities w/o difficulty. Affect pleasant   Telemetry   NSR 70-80s Personally reviewed  Labs    CBC Recent Labs    04/24/23 0505 04/24/23 0924 04/24/23 1327  WBC 12.9*  --  15.6*  HGB 8.5* 10.2* 8.2*  HCT 26.3* 30.0* 25.2*  MCV 79.9*  --  79.5*  PLT 671*  --  657*   Basic Metabolic Panel Recent Labs    32/35/57 0326 04/23/23 1241 04/24/23 0505 04/24/23 0924  NA 127*   < > 127* 125*  K 3.9   < > 3.8 3.8  CL 87*  --  88*  --   CO2 25  --  26  --   GLUCOSE 155*  --  127*  --   BUN 52*  --  55*  --   CREATININE 2.76*  --  3.32*  --   CALCIUM 8.1*  --  8.0*  --   MG 2.1  --  2.0  --    < > = values in this interval not displayed.   Liver Function Tests No results for input(s): "AST", "ALT", "ALKPHOS", "BILITOT", "PROT", "ALBUMIN" in the last 72 hours.  No results for input(s): "LIPASE", "AMYLASE" in the last 72 hours. Cardiac Enzymes No results for input(s): "CKTOTAL", "CKMB", "CKMBINDEX", "TROPONINI" in the last 72 hours.  BNP: BNP (last 3 results) Recent Labs  04/20/23 0432 04/20/23 2119 04/21/23 0921  BNP 2,698.9* 2,078.7* 1,292.0*    ProBNP (last 3 results) Recent Labs    09/25/22 0953  PROBNP 2,187*     D-Dimer No results for input(s): "DDIMER" in the last 72 hours. Hemoglobin A1C No results for input(s): "HGBA1C" in the last 72 hours. Fasting Lipid Panel No results for input(s): "CHOL", "HDL", "LDLCALC", "TRIG", "CHOLHDL", "LDLDIRECT" in the last 72 hours. Thyroid Function Tests No results for input(s): "TSH", "T4TOTAL", "T3FREE", "THYROIDAB" in the last 72 hours.  Invalid input(s): "FREET3"   Other results:   Imaging    Port CXR Result Date: 04/23/2023 CLINICAL DATA:  Central line placement EXAM: PORTABLE CHEST 1 VIEW COMPARISON:  04/22/2023 FINDINGS: Cardiac pacemakers. Generator packs in the right and left upper chest. Postoperative changes in the mediastinum with sternotomy wires and surgical clips in the mediastinum. Right  Swan-Ganz catheter with tip over the right hilum. Mild cardiac enlargement. Mild perihilar infiltration possibly indicating edema. Probable small pleural effusions. No pneumothorax. Calcification of the aorta. IMPRESSION: Cardiac enlargement with mild perihilar infiltration and probable small pleural effusions. Appliances appear in satisfactory position. Electronically Signed   By: Burman Nieves M.D.   On: 04/23/2023 17:49     Medications:     Scheduled Medications:  acetaminophen  1,000 mg Oral TID   apixaban  5 mg Oral BID   aspirin  81 mg Oral Pre-Cath   atorvastatin  80 mg Oral Daily   calcium-vitamin D  1 tablet Oral BID WC   Chlorhexidine Gluconate Cloth  6 each Topical Daily   Gerhardt's butt cream   Topical BID   insulin aspart  0-15 Units Subcutaneous TID WC   insulin aspart  0-5 Units Subcutaneous QHS   leptospermum manuka honey  1 Application Topical Daily   polyethylene glycol  17 g Oral Daily   senna-docusate  2 tablet Oral BID   sodium bicarbonate  650 mg Oral BID   sodium chloride flush  3 mL Intravenous Q12H    Infusions:  sodium chloride 10 mL/hr at 04/24/23 1300   sodium chloride Stopped (04/23/23 1424)   sodium chloride 10 mL/hr at 04/24/23 1300   sodium chloride     amiodarone 60 mg/hr (04/24/23 1300)   furosemide (LASIX) 200 mg in dextrose 5 % 100 mL (2 mg/mL) infusion 30 mg/hr (04/24/23 1300)   milrinone 0.25 mcg/kg/min (04/24/23 1300)   norepinephrine (LEVOPHED) Adult infusion 3 mcg/min (04/24/23 1300)    PRN Medications: sodium chloride, acetaminophen, magnesium hydroxide, methocarbamol, ondansetron (ZOFRAN) IV, mouth rinse, oxyCODONE, sodium chloride flush, zolpidem    Patient Profile   77 y.o. 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 Readmitted on 12/21 for recurrent afib w/ RVR and a/c CHF w/ voulume overload and low-output, requiring inotropic support w/ milrinone.   Assessment/Plan   1. Acute on chronic systolic heart failure with biventricular involvement: Advanced disease, previously required milrinone for diuresis during his last admission.  Was discharged on midodrine without significant GDMT. Now readmitted w/ volume overload in setting of recurrent Afib w/ RVR  - Echo 11/24 EF 20-25% RV mild to moderately down - continues with poor urinary response to high dose Lasix gtt  despite empiric milrinone support - suspect severe cardiorenal syndrome -GDMT limited by blood pressure, on midodrine 5 mg tid  - will continue milrinone. Stop midodirine. Start low-dose NE for more inotropy  - Discussed possible need for attempt at CVVHD as hemodynamics should support diuresis but he is not diuresing -I am concerned he is nearing end-stage. We discussed GOC. Now aggressive care but DNR/DNI in case of worsening status  2. Atrial fibrillation with RVR: failed recent DCCV. Back in AFib on admit, suspect triggered by worsening volume overload and recent infection  - s/p DCCV 12/23, back in NSR  -Continue IV amiodarone while on milrinone  -Eliquis 5 mg bid  -No bleeding  3. CAD: CABG 2018 - stable  - no s/s angina - apixaban, statin   4. 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 Diabetes: - per IM  - SSI   6. Severe malnutrition - albumin 1.7 on admit  - per IM   7 DNR/DNI - discussion documented  Length of Stay: 4  Arvilla Meres, MD  04/24/2023, 1:47 PM  Advanced  Heart Failure Team Pager [phone] (M-F; 7a - 5p)  Please contact [facility] for night-coverage after hours (5p -7a ) and weekends on [email or web address]

## 2023-04-24 NOTE — Plan of Care (Signed)
Problem: Education: Goal: Knowledge of General Education information will improve Description: Including pain rating scale, medication(s)/side effects and non-pharmacologic comfort measures Outcome: Progressing   Problem: Health Behavior/Discharge Planning: Goal: Ability to manage health-related needs will improve Outcome: Progressing   Problem: Clinical Measurements: Goal: Ability to maintain clinical measurements within normal limits will improve Outcome: Progressing Goal: Will remain free from infection Outcome: Progressing Goal: Diagnostic test results will improve Outcome: Progressing Goal: Respiratory complications will improve Outcome: Progressing Goal: Cardiovascular complication will be avoided Outcome: Progressing   Problem: Activity: Goal: Risk for activity intolerance will decrease Outcome: Progressing   Problem: Nutrition: Goal: Adequate nutrition will be maintained Outcome: Progressing   Problem: Coping: Goal: Level of anxiety will decrease Outcome: Progressing   Problem: Elimination: Goal: Will not experience complications related to bowel motility Outcome: Progressing   Problem: Pain Management: Goal: General experience of comfort will improve Outcome: Progressing   Problem: Safety: Goal: Ability to remain free from injury will improve Outcome: Progressing   Problem: Skin Integrity: Goal: Risk for impaired skin integrity will decrease Outcome: Progressing   Problem: Education: Goal: Knowledge of disease or condition will improve Outcome: Progressing Goal: Understanding of medication regimen will improve Outcome: Progressing Goal: Individualized Educational Video(s) Outcome: Progressing   Problem: Activity: Goal: Ability to tolerate increased activity will improve Outcome: Progressing   Problem: Cardiac: Goal: Ability to achieve and maintain adequate cardiopulmonary perfusion will improve Outcome: Progressing   Problem: Health  Behavior/Discharge Planning: Goal: Ability to safely manage health-related needs after discharge will improve Outcome: Progressing   Problem: Education: Goal: Ability to describe self-care measures that may prevent or decrease complications (Diabetes Survival Skills Education) will improve Outcome: Progressing Goal: Individualized Educational Video(s) Outcome: Progressing   Problem: Coping: Goal: Ability to adjust to condition or change in health will improve Outcome: Progressing   Problem: Health Behavior/Discharge Planning: Goal: Ability to identify and utilize available resources and services will improve Outcome: Progressing Goal: Ability to manage health-related needs will improve Outcome: Progressing   Problem: Metabolic: Goal: Ability to maintain appropriate glucose levels will improve Outcome: Progressing   Problem: Nutritional: Goal: Maintenance of adequate nutrition will improve Outcome: Progressing Goal: Progress toward achieving an optimal weight will improve Outcome: Progressing   Problem: Skin Integrity: Goal: Risk for impaired skin integrity will decrease Outcome: Progressing   Problem: Tissue Perfusion: Goal: Adequacy of tissue perfusion will improve Outcome: Progressing   Problem: Education: Goal: Understanding of CV disease, CV risk reduction, and recovery process will improve Outcome: Progressing Goal: Individualized Educational Video(s) Outcome: Progressing   Problem: Activity: Goal: Ability to return to baseline activity level will improve Outcome: Progressing   Problem: Cardiovascular: Goal: Ability to achieve and maintain adequate cardiovascular perfusion will improve Outcome: Progressing Goal: Vascular access site(s) Level 0-1 will be maintained Outcome: Progressing   Problem: Health Behavior/Discharge Planning: Goal: Ability to safely manage health-related needs after discharge will improve Outcome: Progressing   Problem:  Education: Goal: Understanding of CV disease, CV risk reduction, and recovery process will improve Outcome: Progressing Goal: Individualized Educational Video(s) Outcome: Progressing   Problem: Activity: Goal: Ability to return to baseline activity level will improve Outcome: Progressing   Problem: Cardiovascular: Goal: Ability to achieve and maintain adequate cardiovascular perfusion will improve Outcome: Progressing Goal: Vascular access site(s) Level 0-1 will be maintained Outcome: Progressing   Problem: Health Behavior/Discharge Planning: Goal: Ability to safely manage health-related needs after discharge will improve Outcome: Progressing     Problem: Elimination: Goal: Will not experience complications related to  urinary retention Outcome: Not Progressing   Problem: Fluid Volume: Goal: Ability to maintain a balanced intake and output will improve Outcome: Not Progressing

## 2023-04-24 NOTE — Plan of Care (Signed)
Problem: Education: Goal: Knowledge of General Education information will improve Description: Including pain rating scale, medication(s)/side effects and non-pharmacologic comfort measures Outcome: Progressing   Problem: Health Behavior/Discharge Planning: Goal: Ability to manage health-related needs will improve Outcome: Progressing   Problem: Clinical Measurements: Goal: Ability to maintain clinical measurements within normal limits will improve Outcome: Progressing Goal: Will remain free from infection Outcome: Progressing Goal: Diagnostic test results will improve Outcome: Progressing Goal: Respiratory complications will improve Outcome: Progressing Goal: Cardiovascular complication will be avoided Outcome: Progressing   Problem: Activity: Goal: Risk for activity intolerance will decrease Outcome: Progressing   Problem: Nutrition: Goal: Adequate nutrition will be maintained Outcome: Progressing   Problem: Coping: Goal: Level of anxiety will decrease Outcome: Progressing   Problem: Elimination: Goal: Will not experience complications related to bowel motility Outcome: Progressing Goal: Will not experience complications related to urinary retention Outcome: Progressing   Problem: Pain Management: Goal: General experience of comfort will improve Outcome: Progressing   Problem: Safety: Goal: Ability to remain free from injury will improve Outcome: Progressing   Problem: Skin Integrity: Goal: Risk for impaired skin integrity will decrease Outcome: Progressing   Problem: Education: Goal: Knowledge of disease or condition will improve Outcome: Progressing Goal: Understanding of medication regimen will improve Outcome: Progressing Goal: Individualized Educational Video(s) Outcome: Progressing   Problem: Activity: Goal: Ability to tolerate increased activity will improve Outcome: Progressing   Problem: Cardiac: Goal: Ability to achieve and maintain  adequate cardiopulmonary perfusion will improve Outcome: Progressing   Problem: Health Behavior/Discharge Planning: Goal: Ability to safely manage health-related needs after discharge will improve Outcome: Progressing   Problem: Education: Goal: Ability to describe self-care measures that may prevent or decrease complications (Diabetes Survival Skills Education) will improve Outcome: Progressing Goal: Individualized Educational Video(s) Outcome: Progressing   Problem: Coping: Goal: Ability to adjust to condition or change in health will improve Outcome: Progressing   Problem: Fluid Volume: Goal: Ability to maintain a balanced intake and output will improve Outcome: Progressing   Problem: Health Behavior/Discharge Planning: Goal: Ability to identify and utilize available resources and services will improve Outcome: Progressing Goal: Ability to manage health-related needs will improve Outcome: Progressing   Problem: Metabolic: Goal: Ability to maintain appropriate glucose levels will improve Outcome: Progressing   Problem: Nutritional: Goal: Maintenance of adequate nutrition will improve Outcome: Progressing Goal: Progress toward achieving an optimal weight will improve Outcome: Progressing   Problem: Skin Integrity: Goal: Risk for impaired skin integrity will decrease Outcome: Progressing   Problem: Tissue Perfusion: Goal: Adequacy of tissue perfusion will improve Outcome: Progressing   Problem: Education: Goal: Understanding of CV disease, CV risk reduction, and recovery process will improve Outcome: Progressing Goal: Individualized Educational Video(s) Outcome: Progressing   Problem: Activity: Goal: Ability to return to baseline activity level will improve Outcome: Progressing   Problem: Cardiovascular: Goal: Ability to achieve and maintain adequate cardiovascular perfusion will improve Outcome: Progressing Goal: Vascular access site(s) Level 0-1 will be  maintained Outcome: Progressing   Problem: Health Behavior/Discharge Planning: Goal: Ability to safely manage health-related needs after discharge will improve Outcome: Progressing   Problem: Education: Goal: Understanding of CV disease, CV risk reduction, and recovery process will improve Outcome: Progressing Goal: Individualized Educational Video(s) Outcome: Progressing   Problem: Activity: Goal: Ability to return to baseline activity level will improve Outcome: Progressing   Problem: Cardiovascular: Goal: Ability to achieve and maintain adequate cardiovascular perfusion will improve Outcome: Progressing Goal: Vascular access site(s) Level 0-1 will be maintained Outcome: Progressing   Problem:  Health Behavior/Discharge Planning: Goal: Ability to safely manage health-related needs after discharge will improve Outcome: Progressing

## 2023-04-24 NOTE — Progress Notes (Signed)
PROGRESS NOTE    [name] [name]  JOA:[id number] DOB: April 09, 1946 DOA: 04/20/2023 PCP: [name], MD   77 y.o. male with COPD, CAD, CABG 2018, ischemic cardiomyopathy EF 20-25%, paroxysmal A-fib, hypertension, type 2 diabetes mellitus, history of LV thrombus.   -Admitted 11/4 w/syncope fall, Afib>cardioverted 11/7, discharged 11/8 -Readmitted 11/25 with increased right hip pain, imaging noted retroperitoneal hematoma extending to distal psoas and iliacus and also volume overloaded, diuresed, followed by nephrology and cards then, started on midodrine, discharged to rehab 12/9 on torsemide -In rehab he had worsening leukocytosis up to 43K, infectious disease consulted 12/10, on 12/12 underwent ultrasound-guided aspiration of right hip, 30 cc of turbid brownish fluid drained, Cx w LACTICASEIBACILLUS CASEI, he was treated with few days of IV vancomycin which does not have efficacy against this, leukocytosis largely spontaneously started improving, vancomycin discontinued 12/16.  Last 2 days had increased shortness of breath, elevated heart rate etc.  -12/21: Readmitted from CIR back to inpatient, 2H with A-fib RVR and CHF -12/21, heart failure team consulted, started on milrinone, Lasix dose increased -12/23 cardioverted to sinus rhythm -12/24: Poor response to diuretics  Subjective: Does not want CPR/intubation but is considering HD if needed- patient to continue to have talks with Dr. Leonard Schwartz  Assessment and Plan:  Acute on chronic systolic CHF, ischemic cardiomyopathy Low output state -CHF team managing, prognosis appears to be poor   Atrial fibrillation RVR -On amiodarone, prior cardioversions -Continue IV amiodarone -Per cardiology, sp DCCV 12/23 -Anticoagulation resumed -on apixaban   Retroperitoneal hematoma extending to distal psoas and iliacus -Following mechanical fall -Had profound leukocytosis around 40K in CIR,12/12 underwent ultrasound-guided aspiration of right hip, 30 cc  of turbid brownish fluid drained, Cx w LACTICASEIBACILLUS CASEI, he was treated with few days of IV vancomycin which has no activity against this, leukocytosis largely spontaneously started improving, vancomycin discontinued 12/16, ID signed off -Mild leukocytosis and thrombocytosis persists, afebrile and nontoxic, no abdominal symptoms, monitor -h/h stable   Leukocytosis -See discussion above   CAD  Hx CABG 2018 -Stable   AKI, CKD 3B/4 -Baseline creatinine around 2-2.2 -With low output state, creatinine worsening, see above   Chronic anemia -Relatively stable   Type 2 diabetes mellitus -Recent A1c was 6.9 -On Ozempic at baseline, off metformin now -CBGs are stable, continue SSI,   Severe hypoalbuminemia -No clear history of cirrhosis -?  Malnutrition alone versus combination with chronic liver disease -Dietitian consult   GOALS: 77/M with severe cardiomyopathy, A-fib RVR, severe hypoalbuminemia and third spacing, retroperitoneal hematoma, poor response to milrinone and high-dose Lasix, prognosis is poor-- GOC discussion per Dr. B  DVT prophylaxis: Apixaban Code Status: Full code Family Communication: wife sick today-- asked not to call Disposition Plan: Remain in ICU      Objective: Vitals:   04/24/23 0530 04/24/23 0600 04/24/23 0700 04/24/23 0800  BP:  (!) 113/47 114/64 (!) 114/53  Pulse: 76 77 77 74  Resp: 16 19 20 19   Temp: (!) 97.3 F (36.3 C) (!) 97.5 F (36.4 C) (!) 97.3 F (36.3 C) (!) 97.3 F (36.3 C)  TempSrc:      SpO2: 93% 95% 95% 97%  Weight:  94.4 kg    Height:  6\' 2"  (1.88 m)      Intake/Output Summary (Last 24 hours) at 04/24/2023 1107 Last data filed at 04/24/2023 0800 Gross per 24 hour  Intake 1369.26 ml  Output 445 ml  Net 924.26 ml   Filed Weights   04/23/23 0500 04/24/23  0444 04/24/23 0600  Weight: 97.7 kg 97.1 kg 94.4 kg    Examination:   General: Appearance:     Overweight male who appears ill     Lungs:    respirations  unlabored  Heart:    Normal heart rate. Normal rhythm. No murmurs, rubs, or gallops.   MS:   All extremities are intact.   Neurologic:   Awake, alert       Data Reviewed:   CBC: Recent Labs  Lab 04/18/23 0554 04/19/23 1037 04/20/23 0847 04/21/23 0921 04/22/23 0323 04/23/23 0326 04/23/23 1241 04/24/23 0505 04/24/23 0924  WBC 13.1* 13.8* 16.0* 15.4* 13.4* 15.6*  --  12.9*  --   NEUTROABS 11.3* 12.0*  --   --   --   --   --   --   --   HGB 10.0* 10.3* 10.5* 10.0* 9.2* 9.4* 10.5*  10.2* 8.5* 10.2*  HCT 30.1* 31.4* 32.3* 29.8* 28.3* 28.4* 31.0*  30.0* 26.3* 30.0*  MCV 80.1 80.7 81.0 80.1 80.4 79.6*  --  79.9*  --   PLT 662* 645* 662* 720* 627* 771*  --  671*  --    Basic Metabolic Panel: Recent Labs  Lab 04/19/23 0512 04/20/23 0847 04/21/23 0921 04/22/23 0323 04/22/23 2216 04/23/23 0326 04/23/23 1241 04/24/23 0505 04/24/23 0924  NA 131*   < > 129* 128* 129* 127* 127*  129* 127* 125*  K 4.0   < > 3.8 3.7 4.4 3.9 3.8  3.7 3.8 3.8  CL 94*   < > 91* 92* 91* 87*  --  88*  --   CO2 28   < > 25 24 25 25   --  26  --   GLUCOSE 111*   < > 153* 145* 173* 155*  --  127*  --   BUN 36*   < > 46* 47* 51* 52*  --  55*  --   CREATININE 2.30*   < > 2.56* 2.91* 2.76* 2.76*  --  3.32*  --   CALCIUM 8.0*   < > 8.1* 7.8* 8.1* 8.1*  --  8.0*  --   MG 1.9  --  2.0 2.2  --  2.1  --  2.0  --   PHOS 5.0*  --   --   --   --   --   --   --   --    < > = values in this interval not displayed.   GFR: Estimated Creatinine Clearance: 21.7 mL/min (A) (by C-G formula based on SCr of 3.32 mg/dL (H)). Liver Function Tests: Recent Labs  Lab 04/18/23 0554 04/20/23 0847  AST 46* 70*  ALT 41 45*  ALKPHOS 112 156*  BILITOT 1.2* 1.3*  PROT 4.7* 5.7*  ALBUMIN 1.7* 2.0*   No results for input(s): "LIPASE", "AMYLASE" in the last 168 hours. No results for input(s): "AMMONIA" in the last 168 hours. Coagulation Profile: No results for input(s): "INR", "PROTIME" in the last 168 hours. Cardiac  Enzymes: No results for input(s): "CKTOTAL", "CKMB", "CKMBINDEX", "TROPONINI" in the last 168 hours. BNP (last 3 results) Recent Labs    09/25/22 0953  PROBNP 2,187*   HbA1C: No results for input(s): "HGBA1C" in the last 72 hours. CBG: Recent Labs  Lab 04/23/23 0835 04/23/23 1134 04/23/23 1544 04/23/23 2104 04/24/23 0600  GLUCAP 145* 146* 141* 143* 126*   Lipid Profile: No results for input(s): "CHOL", "HDL", "LDLCALC", "TRIG", "CHOLHDL", "LDLDIRECT" in the last 72 hours. Thyroid Function Tests:  No results for input(s): "TSH", "T4TOTAL", "FREET4", "T3FREE", "THYROIDAB" in the last 72 hours.  Anemia Panel: No results for input(s): "VITAMINB12", "FOLATE", "FERRITIN", "TIBC", "IRON", "RETICCTPCT" in the last 72 hours. Urine analysis:    Component Value Date/Time   COLORURINE YELLOW 04/07/2023 1810   APPEARANCEUR CLEAR 04/07/2023 1810   LABSPEC 1.009 04/07/2023 1810   PHURINE 5.0 04/07/2023 1810   GLUCOSEU NEGATIVE 04/07/2023 1810   HGBUR NEGATIVE 04/07/2023 1810   BILIRUBINUR NEGATIVE 04/07/2023 1810   KETONESUR NEGATIVE 04/07/2023 1810   PROTEINUR NEGATIVE 04/07/2023 1810   NITRITE NEGATIVE 04/07/2023 1810   LEUKOCYTESUR NEGATIVE 04/07/2023 1810    Recent Results (from the past 240 hours)  MRSA Next Gen by PCR, Nasal     Status: None   Collection Time: 04/20/23 10:29 AM   Specimen: Nasal Mucosa; Nasal Swab  Result Value Ref Range Status   MRSA by PCR Next Gen NOT DETECTED NOT DETECTED Final    Comment: (NOTE) The GeneXpert MRSA Assay (FDA approved for NASAL specimens only), is one component of a comprehensive MRSA colonization surveillance program. It is not intended to diagnose MRSA infection nor to guide or monitor treatment for MRSA infections. Test performance is not FDA approved in patients less than 48 years old. Performed at [facility], [address] [place], [place] [address]      Radiology Studies: Port CXR Result Date:  04/23/2023 CLINICAL DATA:  Central line placement EXAM: PORTABLE CHEST 1 VIEW COMPARISON:  04/22/2023 FINDINGS: Cardiac pacemakers. Generator packs in the right and left upper chest. Postoperative changes in the mediastinum with sternotomy wires and surgical clips in the mediastinum. Right Swan-Ganz catheter with tip over the right hilum. Mild cardiac enlargement. Mild perihilar infiltration possibly indicating edema. Probable small pleural effusions. No pneumothorax. Calcification of the aorta. IMPRESSION: Cardiac enlargement with mild perihilar infiltration and probable small pleural effusions. Appliances appear in satisfactory position. Electronically Signed   By: Burman Nieves M.D.   On: 04/23/2023 17:49   CARDIAC CATHETERIZATION Result Date: 04/23/2023 Findings: On milrinone 0.25 mcg/kg/min RA = 14 RV = 58/18 PA = 56/25 (40) PCW = 28 with v waves to 45 Fick cardiac output/index = 5.8/2.6 TD CO/CI = 4.7/2.1 PVR = 2.1 (Fick) 2.6 (TD) Ao sat = 97% PA sat = 46%, 48% PAPi = 2.2 Assessment: 1. Severe biventricular HF with marked fluid overload and prominent v-waves suggestive of significant MR (versus severe diastolic dysfunction Plan/Discussion: Will continue to push diuresis. Adjust inotropes as able. Arvilla Meres, MD 1:05 PM  DG CHEST PORT 1 VIEW Result Date: 04/23/2023 CLINICAL DATA:  Hypoxia EXAM: PORTABLE CHEST 1 VIEW COMPARISON:  04/19/2023 FINDINGS: Prior median sternotomy. Bilateral pacer/AICD again noted, unchanged. Cardiomegaly with vascular congestion. Interstitial prominence may reflect interstitial edema. Layering bilateral pleural effusions. Left base atelectasis. IMPRESSION: 1. Cardiomegaly with vascular congestion and interstitial edema. 2. Layering bilateral pleural effusions. Electronically Signed   By: Charlett Nose M.D.   On: 04/23/2023 00:34   [place] EKG SITE RITE Result Date: 04/22/2023 If Site Rite image not attached, placement could not be confirmed due to current cardiac  rhythm.    Scheduled Meds:  acetaminophen  1,000 mg Oral TID   apixaban  5 mg Oral BID   aspirin  81 mg Oral Pre-Cath   atorvastatin  80 mg Oral Daily   calcium-vitamin D  1 tablet Oral BID WC   Chlorhexidine Gluconate Cloth  6 each Topical Daily   Gerhardt's butt cream   Topical BID  insulin aspart  0-15 Units Subcutaneous TID WC   insulin aspart  0-5 Units Subcutaneous QHS   leptospermum manuka honey  1 Application Topical Daily   polyethylene glycol  17 g Oral Daily   senna-docusate  2 tablet Oral BID   sodium bicarbonate  650 mg Oral BID   sodium chloride flush  3 mL Intravenous Q12H   Continuous Infusions:  sodium chloride 10 mL/hr at 04/24/23 0800   sodium chloride Stopped (04/23/23 1424)   sodium chloride     sodium chloride 10 mL/hr at 04/24/23 0800   sodium chloride     amiodarone 60 mg/hr (04/24/23 0800)   furosemide (LASIX) 200 mg in dextrose 5 % 100 mL (2 mg/mL) infusion 30 mg/hr (04/24/23 1025)   milrinone 0.25 mcg/kg/min (04/24/23 0800)   norepinephrine (LEVOPHED) Adult infusion       LOS: 4 days    Time spent:    Marlin Canary DO [facility]   04/24/2023, 11:07 AM

## 2023-04-25 ENCOUNTER — Encounter (HOSPITAL_COMMUNITY): Payer: Self-pay | Admitting: Internal Medicine

## 2023-04-25 LAB — BASIC METABOLIC PANEL
Anion gap: 13 (ref 5–15)
BUN: 61 mg/dL — ABNORMAL HIGH (ref 8–23)
CO2: 26 mmol/L (ref 22–32)
Calcium: 8 mg/dL — ABNORMAL LOW (ref 8.9–10.3)
Chloride: 86 mmol/L — ABNORMAL LOW (ref 98–111)
Creatinine, Ser: 3.92 mg/dL — ABNORMAL HIGH (ref 0.61–1.24)
GFR, Estimated: 15 mL/min — ABNORMAL LOW (ref >60)
Glucose, Bld: 147 mg/dL — ABNORMAL HIGH (ref 70–99)
Potassium: 4.3 mmol/L (ref 3.5–5.1)
Sodium: 125 mmol/L — ABNORMAL LOW (ref 135–145)

## 2023-04-25 LAB — CBC
HCT: 24.9 % — ABNORMAL LOW (ref 39.0–52.0)
Hemoglobin: 8.2 g/dL — ABNORMAL LOW (ref 13.0–17.0)
MCH: 25.9 pg — ABNORMAL LOW (ref 26.0–34.0)
MCHC: 32.9 g/dL (ref 30.0–36.0)
MCV: 78.8 fL — ABNORMAL LOW (ref 80.0–100.0)
Platelets: 632 10*3/uL — ABNORMAL HIGH (ref 150–400)
RBC: 3.16 MIL/uL — ABNORMAL LOW (ref 4.22–5.81)
RDW: 19.8 % — ABNORMAL HIGH (ref 11.5–15.5)
WBC: 18.7 10*3/uL — ABNORMAL HIGH (ref 4.0–10.5)
nRBC: 0 % (ref 0.0–0.2)

## 2023-04-25 LAB — POCT I-STAT EG7
Acid-Base Excess: 2 mmol/L (ref 0.0–2.0)
Bicarbonate: 26.4 mmol/L (ref 20.0–28.0)
Calcium, Ion: 1.07 mmol/L — ABNORMAL LOW (ref 1.15–1.40)
HCT: 27 % — ABNORMAL LOW (ref 39.0–52.0)
Hemoglobin: 9.2 g/dL — ABNORMAL LOW (ref 13.0–17.0)
O2 Saturation: 47 %
Patient temperature: 36.9
Potassium: 4.1 mmol/L (ref 3.5–5.1)
Sodium: 122 mmol/L — ABNORMAL LOW (ref 135–145)
TCO2: 28 mmol/L (ref 22–32)
pCO2, Ven: 40.9 mm[Hg] — ABNORMAL LOW (ref 44–60)
pH, Ven: 7.417 (ref 7.25–7.43)
pO2, Ven: 25 mm[Hg] — CL (ref 32–45)

## 2023-04-25 LAB — COOXEMETRY PANEL
Carboxyhemoglobin: 2.2 % — ABNORMAL HIGH (ref 0.5–1.5)
Methemoglobin: 1.2 % (ref 0.0–1.5)
O2 Saturation: 55.4 %
Total hemoglobin: 8.3 g/dL — ABNORMAL LOW (ref 12.0–16.0)

## 2023-04-25 LAB — GLUCOSE, CAPILLARY
Glucose-Capillary: 154 mg/dL — ABNORMAL HIGH (ref 70–99)
Glucose-Capillary: 155 mg/dL — ABNORMAL HIGH (ref 70–99)
Glucose-Capillary: 176 mg/dL — ABNORMAL HIGH (ref 70–99)
Glucose-Capillary: 140 mg/dL — ABNORMAL HIGH (ref 70–99)
Glucose-Capillary: 150 mg/dL — ABNORMAL HIGH (ref 70–99)

## 2023-04-25 LAB — MAGNESIUM: Magnesium: 2 mg/dL (ref 1.7–2.4)

## 2023-04-25 NOTE — Progress Notes (Signed)
Coox drawn off PA distal port: 55.4% (yesterday 63.3%) VBG drawn off CVP blue port of swan: 47% (yesterday 44%)  Will recommend day team to draw future coox's from CVP port only moving forward for consistent results.

## 2023-04-25 NOTE — Plan of Care (Signed)
Problem: Education: Goal: Knowledge of General Education information will improve Description: Including pain rating scale, medication(s)/side effects and non-pharmacologic comfort measures Outcome: Progressing   Problem: Health Behavior/Discharge Planning: Goal: Ability to manage health-related needs will improve Outcome: Progressing   Problem: Clinical Measurements: Goal: Ability to maintain clinical measurements within normal limits will improve Outcome: Progressing Goal: Will remain free from infection Outcome: Progressing Goal: Diagnostic test results will improve Outcome: Progressing Goal: Respiratory complications will improve Outcome: Progressing Goal: Cardiovascular complication will be avoided Outcome: Progressing   Problem: Activity: Goal: Risk for activity intolerance will decrease Outcome: Progressing   Problem: Nutrition: Goal: Adequate nutrition will be maintained Outcome: Progressing   Problem: Coping: Goal: Level of anxiety will decrease Outcome: Progressing   Problem: Elimination: Goal: Will not experience complications related to bowel motility Outcome: Progressing Goal: Will not experience complications related to urinary retention Outcome: Progressing   Problem: Pain Management: Goal: General experience of comfort will improve Outcome: Progressing   Problem: Safety: Goal: Ability to remain free from injury will improve Outcome: Progressing   Problem: Skin Integrity: Goal: Risk for impaired skin integrity will decrease Outcome: Progressing   Problem: Education: Goal: Knowledge of disease or condition will improve Outcome: Progressing Goal: Understanding of medication regimen will improve Outcome: Progressing Goal: Individualized Educational Video(s) Outcome: Progressing   Problem: Activity: Goal: Ability to tolerate increased activity will improve Outcome: Progressing   Problem: Cardiac: Goal: Ability to achieve and maintain  adequate cardiopulmonary perfusion will improve Outcome: Progressing   Problem: Health Behavior/Discharge Planning: Goal: Ability to safely manage health-related needs after discharge will improve Outcome: Progressing   Problem: Education: Goal: Ability to describe self-care measures that may prevent or decrease complications (Diabetes Survival Skills Education) will improve Outcome: Progressing Goal: Individualized Educational Video(s) Outcome: Progressing   Problem: Coping: Goal: Ability to adjust to condition or change in health will improve Outcome: Progressing   Problem: Fluid Volume: Goal: Ability to maintain a balanced intake and output will improve Outcome: Progressing   Problem: Health Behavior/Discharge Planning: Goal: Ability to identify and utilize available resources and services will improve Outcome: Progressing Goal: Ability to manage health-related needs will improve Outcome: Progressing   Problem: Metabolic: Goal: Ability to maintain appropriate glucose levels will improve Outcome: Progressing   Problem: Nutritional: Goal: Maintenance of adequate nutrition will improve Outcome: Progressing Goal: Progress toward achieving an optimal weight will improve Outcome: Progressing   Problem: Skin Integrity: Goal: Risk for impaired skin integrity will decrease Outcome: Progressing   Problem: Tissue Perfusion: Goal: Adequacy of tissue perfusion will improve Outcome: Progressing   Problem: Education: Goal: Understanding of CV disease, CV risk reduction, and recovery process will improve Outcome: Progressing Goal: Individualized Educational Video(s) Outcome: Progressing   Problem: Activity: Goal: Ability to return to baseline activity level will improve Outcome: Progressing   Problem: Cardiovascular: Goal: Ability to achieve and maintain adequate cardiovascular perfusion will improve Outcome: Progressing Goal: Vascular access site(s) Level 0-1 will be  maintained Outcome: Progressing   Problem: Health Behavior/Discharge Planning: Goal: Ability to safely manage health-related needs after discharge will improve Outcome: Progressing   Problem: Education: Goal: Understanding of CV disease, CV risk reduction, and recovery process will improve Outcome: Progressing Goal: Individualized Educational Video(s) Outcome: Progressing   Problem: Activity: Goal: Ability to return to baseline activity level will improve Outcome: Progressing   Problem: Cardiovascular: Goal: Ability to achieve and maintain adequate cardiovascular perfusion will improve Outcome: Progressing Goal: Vascular access site(s) Level 0-1 will be maintained Outcome: Progressing   Problem:  Health Behavior/Discharge Planning: Goal: Ability to safely manage health-related needs after discharge will improve Outcome: Progressing

## 2023-04-25 NOTE — Progress Notes (Addendum)
1610 - this RN paged ortho tech to replace unna boots as the old boots were removed by the previous shift RN, pt's legs were washed and dried, skin assessment was completed, and topical wound care was performed.   9604 - unit secretary paged ortho tech to remind of need for unna boot application.   1115 - unit secretary paged ortho tech to remind of need to [employer] application. Per ortho tech, they are very busy today and will arrive as soon as possible.  1140 - ortho techs arrived to unit to apply unna boots

## 2023-04-25 NOTE — Progress Notes (Signed)
  This RN was called to pt's room by Ortho Tech to observe skin integrity prior to applying unna boots. Skin had been previously assessed and documented on during 0800 assessment 04/25/23, with abrasions to the left lower extremity noted since admission to unit, weeping to bilateral lower extremities first noted on 04/22/23, and ecchymosis to the left foot noted during 0800 assessment 04/25/23.   Attached are photos taken prior to unna boot reapplication. Patient's legs were washed and dried, and topical wound care was performed.

## 2023-04-25 NOTE — Progress Notes (Addendum)
Advanced Heart Failure Rounding Note  Cardiologist: Arvilla Meres, MD   Subjective:   Chief Complaint: dyspnea and fatigue   12/22: started on empiric milrinone 0.125 + high dose lasix, 160 IV x 2 12/23: s/p DCCV>>NSR. Milrinone increased to 0.25.  12/24: RHC 12/24 (On milrinone 0.25 mcg/kg/min) RA = 14 RV = 58/18 PA = 56/25 (40) PCW = 28 with v waves to 45 Fick cardiac output/index = 5.8/2.6 TD CO/CI = 4.7/2.1 PVR = 2.1 (Fick) 2.6 (TD) Ao sat = 97% PA sat = 46%, 48% PAPi = 2.2 Lasix gtt increased to 30  12/25: Midodrine discontinued. Started on NE   On Milrinone 0.25 + NE 3. Co-ox 55%   On Lasix gtt @ 30. UOP still poor despite addition of NE. Oliguric, 205 cc yesterday.   SCr 2.76>>3.32>>3.92 BUN 61 K 4.3  Na 125    CVP 9  PA 48/14 PCWP 20 CI 2.35 CO 5.26   Sitting up in bed. Denies resting dyspnea. No confusion. Denies n/v. Remains optimistic.    Objective:   Weight Range: 96.8 kg Body mass index is 27.4 kg/m.   Vital Signs:   Temp:  [94.6 F (34.8 C)-98.8 F (37.1 C)] 98.6 F (37 C) (12/26 0730) Pulse Rate:  [68-79] 73 (12/26 0730) Resp:  [10-24] 16 (12/26 0730) BP: (93-178)/(44-145) 113/48 (12/26 0730) SpO2:  [86 %-100 %] 94 % (12/26 0730) Weight:  [96.8 kg] 96.8 kg (12/26 0520) Last BM Date : 04/23/23  Weight change: Filed Weights   04/24/23 0444 04/24/23 0600 04/25/23 0520  Weight: 97.1 kg 94.4 kg 96.8 kg    Intake/Output:   Intake/Output Summary (Last 24 hours) at 04/25/2023 0755 Last data filed at 04/25/2023 0700 Gross per 24 hour  Intake 2353.07 ml  Output 205 ml  Net 2148.07 ml      Physical Exam    CVP 9  General:  fatigued appearing. No respiratory difficulty HEENT: normal Neck: supple. JVD 9 cm, + RIJ Swan. Carotids 2+ bilat; no bruits. No lymphadenopathy or thyromegaly appreciated. Cor: PMI nondisplaced. Regular rate & rhythm. No rubs, gallops or murmurs. Lungs: decreased BS at the bases  Abdomen: soft,  nontender, nondistended. No hepatosplenomegaly. No bruits or masses. Good bowel sounds. Extremities: no cyanosis, clubbing, rash, 2-3+ b/l LE edema Neuro: alert & oriented x 3, cranial nerves grossly intact. moves all 4 extremities w/o difficulty. Affect pleasant. GU: + foley  Telemetry   NSR 70-80s Personally reviewed  Labs    CBC Recent Labs    04/24/23 1327 04/25/23 0515 04/25/23 0605  WBC 15.6* 18.7*  --   HGB 8.2* 8.2* 9.2*  HCT 25.2* 24.9* 27.0*  MCV 79.5* 78.8*  --   PLT 657* 632*  --    Basic Metabolic Panel Recent Labs    65/78/46 0505 04/24/23 0924 04/25/23 0515 04/25/23 0605  NA 127*   < > 125* 122*  K 3.8   < > 4.3 4.1  CL 88*  --  86*  --   CO2 26  --  26  --   GLUCOSE 127*  --  147*  --   BUN 55*  --  61*  --   CREATININE 3.32*  --  3.92*  --   CALCIUM 8.0*  --  8.0*  --   MG 2.0  --  2.0  --    < > = values in this interval not displayed.   Liver Function Tests No results for input(s): "AST", "ALT", "ALKPHOS", "  BILITOT", "PROT", "ALBUMIN" in the last 72 hours.  No results for input(s): "LIPASE", "AMYLASE" in the last 72 hours. Cardiac Enzymes No results for input(s): "CKTOTAL", "CKMB", "CKMBINDEX", "TROPONINI" in the last 72 hours.  BNP: BNP (last 3 results) Recent Labs    04/20/23 0432 04/20/23 2119 04/21/23 0921  BNP 2,698.9* 2,078.7* 1,292.0*    ProBNP (last 3 results) Recent Labs    09/25/22 0953  PROBNP 2,187*     D-Dimer No results for input(s): "DDIMER" in the last 72 hours. Hemoglobin A1C No results for input(s): "HGBA1C" in the last 72 hours. Fasting Lipid Panel No results for input(s): "CHOL", "HDL", "LDLCALC", "TRIG", "CHOLHDL", "LDLDIRECT" in the last 72 hours. Thyroid Function Tests No results for input(s): "TSH", "T4TOTAL", "T3FREE", "THYROIDAB" in the last 72 hours.  Invalid input(s): "FREET3"   Other results:   Imaging    No results found.    Medications:     Scheduled Medications:   acetaminophen  1,000 mg Oral TID   apixaban  5 mg Oral BID   atorvastatin  80 mg Oral Daily   calcium-vitamin D  1 tablet Oral BID WC   Chlorhexidine Gluconate Cloth  6 each Topical Daily   Gerhardt's butt cream   Topical BID   insulin aspart  0-15 Units Subcutaneous TID WC   insulin aspart  0-5 Units Subcutaneous QHS   leptospermum manuka honey  1 Application Topical Daily   polyethylene glycol  17 g Oral Daily   senna-docusate  2 tablet Oral BID   sodium bicarbonate  650 mg Oral BID   sodium chloride flush  3 mL Intravenous Q12H    Infusions:  sodium chloride 10 mL/hr at 04/25/23 0700   sodium chloride     amiodarone 60 mg/hr (04/25/23 0714)   furosemide (LASIX) 200 mg in dextrose 5 % 100 mL (2 mg/mL) infusion 30 mg/hr (04/25/23 0700)   milrinone 0.25 mcg/kg/min (04/25/23 0700)   norepinephrine (LEVOPHED) Adult infusion 3 mcg/min (04/25/23 0700)    PRN Medications: sodium chloride, acetaminophen, magnesium hydroxide, methocarbamol, ondansetron (ZOFRAN) IV, mouth rinse, oxyCODONE, sodium chloride flush, zolpidem    Patient Profile   77 y.o. 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 Readmitted on 12/21 for recurrent afib w/ RVR and a/c CHF w/ voulume overload and low-output, requiring inotropic support w/ milrinone.   Assessment/Plan   1. Acute on chronic systolic heart failure with biventricular involvement: Advanced disease, previously required milrinone for diuresis during his last admission.  Was discharged on midodrine without significant GDMT. Now readmitted w/ volume overload in setting of recurrent Afib w/  RVR  - Echo 11/24 EF 20-25% RV mild to moderately down - continues with poor urinary response to high dose Lasix gtt despite empiric milrinone support - suspect severe cardiorenal syndrome - GDMT limited by blood pressure and renal fx  - filling pressures better today but c/w peripheral edema/3rd spacing, rising SCr and oliguria  - Continue milrinone 0.25 + NE 3    - No immediate need for HD yet. Stop lasix gtt today and monitor - place UNNA boots today  - may need transition to HD next 24-48 hrs if not improving  -I am concerned he  is nearing end-stage. We discussed GOC. Now aggressive care but DNR/DNI in case of worsening status  2. Atrial fibrillation with RVR: failed recent DCCV. Back in AFib on admit, suspect triggered by worsening volume overload and recent infection  - s/p DCCV 12/23, back in NSR  -Continue IV amiodarone while on milrinone, reduce to 30/hr   -Eliquis 5 mg bid  -No bleeding  3. CAD: CABG 2018 - stable  - no s/s angina - apixaban, statin   4. AKI on CKD IIIb-IV:  - cardiorenal/worsened by low- output  - Limits ability to diuresis, advanced therapies - b/l SCr ~1.8-2.0 - SCr up to 3.9 today - inotrope's per above - hold lasix per above  - discussed possible trial of CVHD   5. Diabetes: - per IM  - SSI   6. Severe malnutrition - albumin 1.7 on admit  - per IM   7. Iron-deficiency anemia - hgb has dropped  about 2 points over last week. No overt bleeding sites - MCV is low - Check iron stores  8 DNR/DNI - discussion documented   Length of Stay: 5  Brittainy Simmons, PA-C  04/25/2023, 7:55 AM  Advanced Heart Failure Team Pager [phone] (M-F; 7a - 5p)  Please contact [facility] for night-coverage after hours (5p -7a ) and weekends on [email or web address]   Agree with above.    He remains very weak. On milrinone and NE. Lasix gtt 30. Swan numbers much improved despite almos no diuresis. Scr continues to worsen.   General:  Weak appearing. No  resp difficulty HEENT: normal Neck: supple. RIJ swan  Carotids 2+ bilat; no bruits. No lymphadenopathy or thryomegaly appreciated. Cor: PMI nondisplaced. Regular rate & rhythm.2/6 TR Lungs: clear Abdomen: soft, nontender, nondistended. No hepatosplenomegaly. No bruits or masses. Good bowel sounds. Extremities: no cyanosis, clubbing, rash, 3+ edema Neuro: alert & orientedx3, cranial nerves grossly intact. moves all 4 extremities w/o difficulty. 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 Hemodynamics improved with NE and milrinone but still markedly edematous with 3rd spacing and Scr now 3.9.  Will stop lasix. We discussed possibility of temporary CVVHD if Scr worsens or he develops progressive uremia (perhaps slightly uremic today)  I will have family meeting with him and his wife today to discuss GOC.   CRITICAL CARE Performed by: Arvilla Meres  Total critical care time: 45 minutes  Critical care time was exclusive of separately billable procedures and treating other patients.  Critical care was necessary to treat or prevent imminent or life-threatening deterioration.  Critical care was time spent personally by me (independent of midlevel providers or residents) on the following activities: development of treatment plan with patient and/or surrogate as well as nursing, discussions with consultants, evaluation of patient's response to treatment, examination of patient, obtaining history from patient or surrogate, ordering and performing treatments and interventions, ordering and review of laboratory studies, ordering and review of radiographic studies, pulse oximetry and re-evaluation of patient's condition.  Arvilla Meres, MD  9:46 AM

## 2023-04-25 NOTE — Progress Notes (Signed)
Inpatient Rehab Admissions Coordinator:   Continue to follow distantly.  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  GOC discussion pending.   Estill Dooms, PT, DPT Admissions Coordinator [phone] 04/25/23  11:06 AM

## 2023-04-25 NOTE — Plan of Care (Signed)
Problem: Education: Goal: Knowledge of General Education information will improve Description: Including pain rating scale, medication(s)/side effects and non-pharmacologic comfort measures Outcome: Progressing   Problem: Health Behavior/Discharge Planning: Goal: Ability to manage health-related needs will improve Outcome: Progressing   Problem: Clinical Measurements: Goal: Ability to maintain clinical measurements within normal limits will improve Outcome: Progressing Goal: Will remain free from infection Outcome: Progressing Goal: Diagnostic test results will improve Outcome: Progressing Goal: Respiratory complications will improve Outcome: Progressing Goal: Cardiovascular complication will be avoided Outcome: Progressing   Problem: Activity: Goal: Risk for activity intolerance will decrease Outcome: Progressing   Problem: Nutrition: Goal: Adequate nutrition will be maintained Outcome: Progressing   Problem: Coping: Goal: Level of anxiety will decrease Outcome: Progressing   Problem: Elimination: Goal: Will not experience complications related to bowel motility Outcome: Progressing Goal: Will not experience complications related to urinary retention Outcome: Progressing   Problem: Pain Management: Goal: General experience of comfort will improve Outcome: Progressing   Problem: Safety: Goal: Ability to remain free from injury will improve Outcome: Progressing   Problem: Education: Goal: Knowledge of disease or condition will improve Outcome: Progressing Goal: Understanding of medication regimen will improve Outcome: Progressing Goal: Individualized Educational Video(s) Outcome: Progressing   Problem: Activity: Goal: Ability to tolerate increased activity will improve Outcome: Progressing   Problem: Cardiac: Goal: Ability to achieve and maintain adequate cardiopulmonary perfusion will improve Outcome: Progressing   Problem: Health Behavior/Discharge  Planning: Goal: Ability to safely manage health-related needs after discharge will improve Outcome: Progressing   Problem: Education: Goal: Ability to describe self-care measures that may prevent or decrease complications (Diabetes Survival Skills Education) will improve Outcome: Progressing Goal: Individualized Educational Video(s) Outcome: Progressing   Problem: Coping: Goal: Ability to adjust to condition or change in health will improve Outcome: Progressing   Problem: Health Behavior/Discharge Planning: Goal: Ability to identify and utilize available resources and services will improve Outcome: Progressing Goal: Ability to manage health-related needs will improve Outcome: Progressing   Problem: Metabolic: Goal: Ability to maintain appropriate glucose levels will improve Outcome: Progressing   Problem: Nutritional: Goal: Maintenance of adequate nutrition will improve Outcome: Progressing Goal: Progress toward achieving an optimal weight will improve Outcome: Progressing   Problem: Tissue Perfusion: Goal: Adequacy of tissue perfusion will improve Outcome: Progressing   Problem: Education: Goal: Understanding of CV disease, CV risk reduction, and recovery process will improve Outcome: Progressing Goal: Individualized Educational Video(s) Outcome: Progressing   Problem: Activity: Goal: Ability to return to baseline activity level will improve Outcome: Progressing   Problem: Cardiovascular: Goal: Ability to achieve and maintain adequate cardiovascular perfusion will improve Outcome: Progressing Goal: Vascular access site(s) Level 0-1 will be maintained Outcome: Progressing   Problem: Health Behavior/Discharge Planning: Goal: Ability to safely manage health-related needs after discharge will improve Outcome: Progressing   Problem: Education: Goal: Understanding of CV disease, CV risk reduction, and recovery process will improve Outcome: Progressing Goal:  Individualized Educational Video(s) Outcome: Progressing   Problem: Activity: Goal: Ability to return to baseline activity level will improve Outcome: Progressing   Problem: Cardiovascular: Goal: Ability to achieve and maintain adequate cardiovascular perfusion will improve Outcome: Progressing Goal: Vascular access site(s) Level 0-1 will be maintained Outcome: Progressing   Problem: Health Behavior/Discharge Planning: Goal: Ability to safely manage health-related needs after discharge will improve Outcome: Progressing   Problem: Skin Integrity: Goal: Risk for impaired skin integrity will decrease Outcome: Not Progressing   Problem: Fluid Volume: Goal: Ability to maintain a balanced intake and output will improve  Outcome: Not Progressing   Problem: Skin Integrity: Goal: Risk for impaired skin integrity will decrease Outcome: Not Progressing

## 2023-04-25 NOTE — Progress Notes (Addendum)
Orthopedic Tech Progress Note Patient Details:  [name] [name] 06-28-1945 [id number]  Before application of the unna boots small longitudinal cuts were noted to the pt's bilateral shins. Given the orientation and size, they appear to have come from suture scissors being used to remove the unna boots. [name], RN was made aware of this and took photos of the cuts. RN states unna boots were removed by the previous shift and she confirmed that she visualizes the cuts as well.  Topical wound care was left in place from nursing and bilateral unna boots were placed.   Ortho Devices Type of Ortho Device: Radio broadcast assistant Ortho Device/Splint Location: BLE Ortho Device/Splint Interventions: Ordered, Application, Adjustment   Post Interventions Patient Tolerated: Well Instructions Provided: Care of device  [name] Carmine Savoy 04/25/2023, 12:14 PM

## 2023-04-25 NOTE — Progress Notes (Signed)
  Spoke with Mr. Boice and his family at length about his situation and options.   He has decided to pursue a trial of CVVD as needed understanding that he is likely not a candidate for long-term HD due to the severity of his HF.   Will continue to hold diuretics today.   If renal function continues to deteriorate or has uremic symptoms will consult Nephrology and take to cath lab tomorrow for HD cath placement.   Plan discussed with nursing team as well  Additional CCT 40 mins.   Arvilla Meres, MD  3:25 PM

## 2023-04-25 NOTE — Progress Notes (Signed)
Unna boots removed in preparation for reapplication today. Wounds cleansed with vashe and dressed with gauze and medipore tape.

## 2023-04-25 NOTE — H&P (View-Only) (Signed)
  Spoke with Mr. Boice and his family at length about his situation and options.   He has decided to pursue a trial of CVVD as needed understanding that he is likely not a candidate for long-term HD due to the severity of his HF.   Will continue to hold diuretics today.   If renal function continues to deteriorate or has uremic symptoms will consult Nephrology and take to cath lab tomorrow for HD cath placement.   Plan discussed with nursing team as well  Additional CCT 40 mins.   Arvilla Meres, MD  3:25 PM

## 2023-04-25 NOTE — Progress Notes (Signed)
PROGRESS NOTE    [name] [name]  WUJ:[id number] DOB: January 20, 1946 DOA: 04/20/2023 PCP: [name], MD   77 y.o. male with COPD, CAD, CABG 2018, ischemic cardiomyopathy EF 20-25%, paroxysmal A-fib, hypertension, type 2 diabetes mellitus, history of LV thrombus.   -Admitted 11/4 w/syncope fall, Afib>cardioverted 11/7, discharged 11/8 -Readmitted 11/25 with increased right hip pain, imaging noted retroperitoneal hematoma extending to distal psoas and iliacus and also volume overloaded, diuresed, followed by nephrology and cards then, started on midodrine, discharged to rehab 12/9 on torsemide -In rehab he had worsening leukocytosis up to 43K, infectious disease consulted 12/10, on 12/12 underwent ultrasound-guided aspiration of right hip, 30 cc of turbid brownish fluid drained, Cx w LACTICASEIBACILLUS CASEI, he was treated with few days of IV vancomycin which does not have efficacy against this, leukocytosis largely spontaneously started improving, vancomycin discontinued 12/16.  Last 2 days had increased shortness of breath, elevated heart rate etc.  -12/21: Readmitted from CIR back to inpatient, 2H with A-fib RVR and CHF -12/21, heart failure team consulted, started on milrinone, Lasix dose increased -12/23 cardioverted to sinus rhythm -12/24: Poor response to diuretics  Subjective: Unable to get ipad connected to internet  Assessment and Plan:  Acute on chronic systolic CHF, ischemic cardiomyopathy Low output state -CHF team managing, prognosis appears to be poor   Atrial fibrillation RVR -On amiodarone, prior cardioversions -Continue IV amiodarone -Per cardiology, sp DCCV 12/23 -Anticoagulation resumed -on apixaban   Retroperitoneal hematoma extending to distal psoas and iliacus -Following mechanical fall -Had profound leukocytosis around 40K in CIR,12/12 underwent ultrasound-guided aspiration of right hip, 30 cc of turbid brownish fluid drained, Cx w LACTICASEIBACILLUS CASEI,  he was treated with few days of IV vancomycin which has no activity against this, leukocytosis largely spontaneously started improving, vancomycin discontinued 12/16, ID signed off -Mild leukocytosis and thrombocytosis persists, afebrile and nontoxic, no abdominal symptoms, monitor -trend h/h   Leukocytosis -See discussion above   CAD  Hx CABG 2018 -Stable   AKI, CKD 3B/4 -Baseline creatinine around 2-2.2 -With low output state, creatinine worsening, see above   Chronic anemia -Relatively stable   Type 2 diabetes mellitus -Recent A1c was 6.9 -On Ozempic at baseline, off metformin now -CBGs are stable, continue SSI,   Severe hypoalbuminemia -No clear history of cirrhosis -?  Malnutrition alone versus combination with chronic liver disease -Dietitian consult   GOALS: 77/M with severe cardiomyopathy, A-fib RVR, severe hypoalbuminemia and third spacing, retroperitoneal hematoma, poor response to milrinone and high-dose Lasix, prognosis is poor-- GOC discussion per Dr. B  DVT prophylaxis: Apixaban Code Status: Full code Disposition Plan: Remain in ICU      Objective: Vitals:   04/25/23 1215 04/25/23 1230 04/25/23 1245 04/25/23 1300  BP: (!) 105/45 (!) 107/42 (!) 107/46 (!) 109/46  Pulse: 70 70 71 70  Resp: 10 11 13 13   Temp: 98.2 F (36.8 C) 98.1 F (36.7 C) 98.1 F (36.7 C) 98.1 F (36.7 C)  TempSrc:      SpO2: 95% 95% 95% 95%  Weight:      Height:        Intake/Output Summary (Last 24 hours) at 04/25/2023 1319 Last data filed at 04/25/2023 1300 Gross per 24 hour  Intake 2363.93 ml  Output 130 ml  Net 2233.93 ml   Filed Weights   04/24/23 0444 04/24/23 0600 04/25/23 0520  Weight: 97.1 kg 94.4 kg 96.8 kg    Examination:   General: Appearance:     Overweight male in no  acute distress     Lungs:    respirations unlabored  Heart:    Normal heart rate. .   MS:   All extremities are intact.   Neurologic:   Awake, alert       Data Reviewed:    CBC: Recent Labs  Lab 04/19/23 1037 04/20/23 0847 04/22/23 0323 04/23/23 0326 04/23/23 1241 04/24/23 0505 04/24/23 0924 04/24/23 1327 04/25/23 0515 04/25/23 0605  WBC 13.8*   < > 13.4* 15.6*  --  12.9*  --  15.6* 18.7*  --   NEUTROABS 12.0*  --   --   --   --   --   --   --   --   --   HGB 10.3*   < > 9.2* 9.4*   < > 8.5* 10.2* 8.2* 8.2* 9.2*  HCT 31.4*   < > 28.3* 28.4*   < > 26.3* 30.0* 25.2* 24.9* 27.0*  MCV 80.7   < > 80.4 79.6*  --  79.9*  --  79.5* 78.8*  --   PLT 645*   < > 627* 771*  --  671*  --  657* 632*  --    < > = values in this interval not displayed.   Basic Metabolic Panel: Recent Labs  Lab 04/19/23 0512 04/20/23 0847 04/21/23 0921 04/22/23 0323 04/22/23 2216 04/23/23 0326 04/23/23 1241 04/24/23 0505 04/24/23 0924 04/25/23 0515 04/25/23 0605  NA 131*   < > 129* 128* 129* 127* 127*  129* 127* 125* 125* 122*  K 4.0   < > 3.8 3.7 4.4 3.9 3.8  3.7 3.8 3.8 4.3 4.1  CL 94*   < > 91* 92* 91* 87*  --  88*  --  86*  --   CO2 28   < > 25 24 25 25   --  26  --  26  --   GLUCOSE 111*   < > 153* 145* 173* 155*  --  127*  --  147*  --   BUN 36*   < > 46* 47* 51* 52*  --  55*  --  61*  --   CREATININE 2.30*   < > 2.56* 2.91* 2.76* 2.76*  --  3.32*  --  3.92*  --   CALCIUM 8.0*   < > 8.1* 7.8* 8.1* 8.1*  --  8.0*  --  8.0*  --   MG 1.9  --  2.0 2.2  --  2.1  --  2.0  --  2.0  --   PHOS 5.0*  --   --   --   --   --   --   --   --   --   --    < > = values in this interval not displayed.   GFR: Estimated Creatinine Clearance: 18.3 mL/min (A) (by C-G formula based on SCr of 3.92 mg/dL (H)). Liver Function Tests: Recent Labs  Lab 04/20/23 0847  AST 70*  ALT 45*  ALKPHOS 156*  BILITOT 1.3*  PROT 5.7*  ALBUMIN 2.0*   No results for input(s): "LIPASE", "AMYLASE" in the last 168 hours. No results for input(s): "AMMONIA" in the last 168 hours. Coagulation Profile: No results for input(s): "INR", "PROTIME" in the last 168 hours. Cardiac Enzymes: No results  for input(s): "CKTOTAL", "CKMB", "CKMBINDEX", "TROPONINI" in the last 168 hours. BNP (last 3 results) Recent Labs    09/25/22 0953  PROBNP 2,187*   HbA1C: No results for input(s): "HGBA1C" in  the last 72 hours. CBG: Recent Labs  Lab 04/24/23 1634 04/24/23 2114 04/25/23 0605 04/25/23 0611 04/25/23 1147  GLUCAP 162* 178* 154* 155* 176*   Lipid Profile: No results for input(s): "CHOL", "HDL", "LDLCALC", "TRIG", "CHOLHDL", "LDLDIRECT" in the last 72 hours. Thyroid Function Tests: No results for input(s): "TSH", "T4TOTAL", "FREET4", "T3FREE", "THYROIDAB" in the last 72 hours.  Anemia Panel: No results for input(s): "VITAMINB12", "FOLATE", "FERRITIN", "TIBC", "IRON", "RETICCTPCT" in the last 72 hours. Urine analysis:    Component Value Date/Time   COLORURINE YELLOW 04/07/2023 1810   APPEARANCEUR CLEAR 04/07/2023 1810   LABSPEC 1.009 04/07/2023 1810   PHURINE 5.0 04/07/2023 1810   GLUCOSEU NEGATIVE 04/07/2023 1810   HGBUR NEGATIVE 04/07/2023 1810   BILIRUBINUR NEGATIVE 04/07/2023 1810   KETONESUR NEGATIVE 04/07/2023 1810   PROTEINUR NEGATIVE 04/07/2023 1810   NITRITE NEGATIVE 04/07/2023 1810   LEUKOCYTESUR NEGATIVE 04/07/2023 1810    Recent Results (from the past 240 hours)  MRSA Next Gen by PCR, Nasal     Status: None   Collection Time: 04/20/23 10:29 AM   Specimen: Nasal Mucosa; Nasal Swab  Result Value Ref Range Status   MRSA by PCR Next Gen NOT DETECTED NOT DETECTED Final    Comment: (NOTE) The GeneXpert MRSA Assay (FDA approved for NASAL specimens only), is one component of a comprehensive MRSA colonization surveillance program. It is not intended to diagnose MRSA infection nor to guide or monitor treatment for MRSA infections. Test performance is not FDA approved in patients less than 63 years old. Performed at [facility], [address], [place], [place] [address]      Radiology Studies: Port CXR Result Date: 04/23/2023 CLINICAL DATA:  Central  line placement EXAM: PORTABLE CHEST 1 VIEW COMPARISON:  04/22/2023 FINDINGS: Cardiac pacemakers. Generator packs in the right and left upper chest. Postoperative changes in the mediastinum with sternotomy wires and surgical clips in the mediastinum. Right Swan-Ganz catheter with tip over the right hilum. Mild cardiac enlargement. Mild perihilar infiltration possibly indicating edema. Probable small pleural effusions. No pneumothorax. Calcification of the aorta. IMPRESSION: Cardiac enlargement with mild perihilar infiltration and probable small pleural effusions. Appliances appear in satisfactory position. Electronically Signed   By: Burman Nieves M.D.   On: 04/23/2023 17:49     Scheduled Meds:  acetaminophen  1,000 mg Oral TID   apixaban  5 mg Oral BID   atorvastatin  80 mg Oral Daily   calcium-vitamin D  1 tablet Oral BID WC   Chlorhexidine Gluconate Cloth  6 each Topical Daily   Gerhardt's butt cream   Topical BID   insulin aspart  0-15 Units Subcutaneous TID WC   insulin aspart  0-5 Units Subcutaneous QHS   leptospermum manuka honey  1 Application Topical Daily   polyethylene glycol  17 g Oral Daily   senna-docusate  2 tablet Oral BID   sodium bicarbonate  650 mg Oral BID   sodium chloride flush  3 mL Intravenous Q12H   Continuous Infusions:  sodium chloride 10 mL/hr at 04/25/23 1300   amiodarone 30 mg/hr (04/25/23 1300)   milrinone 0.25 mcg/kg/min (04/25/23 1300)   norepinephrine (LEVOPHED) Adult infusion 3 mcg/min (04/25/23 1300)     LOS: 5 days    Time spent:    Marlin Canary DO [facility]   04/25/2023, 1:19 PM

## 2023-04-26 ENCOUNTER — Inpatient Hospital Stay (HOSPITAL_COMMUNITY): Payer: Medicare Other

## 2023-04-26 ENCOUNTER — Inpatient Hospital Stay (HOSPITAL_COMMUNITY): Admission: RE | Disposition: E | Payer: Self-pay | Source: Ambulatory Visit | Attending: Internal Medicine

## 2023-04-26 HISTORY — PX: TEMPORARY DIALYSIS CATHETER: CATH118312

## 2023-04-26 HISTORY — PX: CENTRAL LINE INSERTION: CATH118232

## 2023-04-26 LAB — RENAL FUNCTION PANEL
Albumin: 1.5 g/dL — ABNORMAL LOW (ref 3.5–5.0)
Anion gap: 11 (ref 5–15)
BUN: 58 mg/dL — ABNORMAL HIGH (ref 8–23)
CO2: 26 mmol/L (ref 22–32)
Calcium: 7.7 mg/dL — ABNORMAL LOW (ref 8.9–10.3)
Chloride: 87 mmol/L — ABNORMAL LOW (ref 98–111)
Creatinine, Ser: 4.38 mg/dL — ABNORMAL HIGH (ref 0.61–1.24)
GFR, Estimated: 13 mL/min — ABNORMAL LOW (ref >60)
Glucose, Bld: 163 mg/dL — ABNORMAL HIGH (ref 70–99)
Phosphorus: 5.6 mg/dL — ABNORMAL HIGH (ref 2.5–4.6)
Potassium: 4.5 mmol/L (ref 3.5–5.1)
Sodium: 124 mmol/L — ABNORMAL LOW (ref 135–145)

## 2023-04-26 LAB — CBC
HCT: 22.9 % — ABNORMAL LOW (ref 39.0–52.0)
Hemoglobin: 7.7 g/dL — ABNORMAL LOW (ref 13.0–17.0)
MCH: 25.8 pg — ABNORMAL LOW (ref 26.0–34.0)
MCHC: 33.6 g/dL (ref 30.0–36.0)
MCV: 76.8 fL — ABNORMAL LOW (ref 80.0–100.0)
Platelets: 564 10*3/uL — ABNORMAL HIGH (ref 150–400)
RBC: 2.98 MIL/uL — ABNORMAL LOW (ref 4.22–5.81)
RDW: 19.7 % — ABNORMAL HIGH (ref 11.5–15.5)
WBC: 37.4 10*3/uL — ABNORMAL HIGH (ref 4.0–10.5)
nRBC: 0 % (ref 0.0–0.2)

## 2023-04-26 LAB — BASIC METABOLIC PANEL
Anion gap: 15 (ref 5–15)
BUN: 70 mg/dL — ABNORMAL HIGH (ref 8–23)
CO2: 24 mmol/L (ref 22–32)
Calcium: 7.7 mg/dL — ABNORMAL LOW (ref 8.9–10.3)
Chloride: 83 mmol/L — ABNORMAL LOW (ref 98–111)
Creatinine, Ser: 5.18 mg/dL — ABNORMAL HIGH (ref 0.61–1.24)
GFR, Estimated: 11 mL/min — ABNORMAL LOW (ref >60)
Glucose, Bld: 130 mg/dL — ABNORMAL HIGH (ref 70–99)
Potassium: 4.7 mmol/L (ref 3.5–5.1)
Sodium: 122 mmol/L — ABNORMAL LOW (ref 135–145)

## 2023-04-26 LAB — HEMOGLOBIN AND HEMATOCRIT, BLOOD
HCT: 22.3 % — ABNORMAL LOW (ref 39.0–52.0)
Hemoglobin: 7.4 g/dL — ABNORMAL LOW (ref 13.0–17.0)

## 2023-04-26 LAB — COOXEMETRY PANEL
Carboxyhemoglobin: 0.9 % (ref 0.5–1.5)
Methemoglobin: <0.7 % (ref 0.0–1.5)
O2 Saturation: 44.6 %
Total hemoglobin: 8.1 g/dL — ABNORMAL LOW (ref 12.0–16.0)

## 2023-04-26 LAB — MAGNESIUM: Magnesium: 2 mg/dL (ref 1.7–2.4)

## 2023-04-26 LAB — GLUCOSE, CAPILLARY
Glucose-Capillary: 137 mg/dL — ABNORMAL HIGH (ref 70–99)
Glucose-Capillary: 144 mg/dL — ABNORMAL HIGH (ref 70–99)
Glucose-Capillary: 146 mg/dL — ABNORMAL HIGH (ref 70–99)
Glucose-Capillary: 176 mg/dL — ABNORMAL HIGH (ref 70–99)

## 2023-04-26 SURGERY — CENTRAL LINE INSERTION
Anesthesia: LOCAL

## 2023-04-26 MED ORDER — ENSURE ENLIVE PO LIQD
237.0000 mL | Freq: Three times a day (TID) | ORAL | Status: DC
  Administered 2023-04-26 – 2023-04-29 (×4): 237 mL via ORAL

## 2023-04-26 MED ORDER — HEPARIN (PORCINE) IN NACL 1000-0.9 UT/500ML-% IV SOLN
INTRAVENOUS | Status: DC | PRN
  Administered 2023-04-26: 500 mL

## 2023-04-26 MED ORDER — RENA-VITE PO TABS
1.0000 | ORAL_TABLET | Freq: Two times a day (BID) | ORAL | Status: DC
  Administered 2023-04-26 – 2023-04-29 (×5): 1 via ORAL
  Filled 2023-04-26 (×6): qty 1

## 2023-04-26 MED ORDER — SALINE SPRAY 0.65 % NA SOLN
1.0000 | NASAL | Status: DC | PRN
  Filled 2023-04-26: qty 44

## 2023-04-26 MED ORDER — LIDOCAINE HCL (PF) 1 % IJ SOLN
INTRAMUSCULAR | Status: DC | PRN
  Administered 2023-04-26 (×2): 5 mL

## 2023-04-26 MED ORDER — HEPARIN SODIUM (PORCINE) 1000 UNIT/ML DIALYSIS
1000.0000 [IU] | INTRAMUSCULAR | Status: DC | PRN
  Administered 2023-04-29: 2400 [IU] via INTRAVENOUS_CENTRAL
  Filled 2023-04-26: qty 6
  Filled 2023-04-26: qty 4

## 2023-04-26 MED ORDER — SODIUM CHLORIDE 0.9 % FOR CRRT
INTRAVENOUS_CENTRAL | Status: DC | PRN
Start: 2023-04-26 — End: 2023-04-30

## 2023-04-26 MED ORDER — LIDOCAINE HCL (PF) 1 % IJ SOLN
INTRAMUSCULAR | Status: AC
Start: 2023-04-26 — End: ?
  Filled 2023-04-26: qty 30

## 2023-04-26 MED ORDER — NOREPINEPHRINE 16 MG/250ML-% IV SOLN
0.0000 ug/min | INTRAVENOUS | Status: DC
  Administered 2023-04-26: 6 ug/min via INTRAVENOUS
  Administered 2023-04-27: 8 ug/min via INTRAVENOUS
  Administered 2023-04-28: 12 ug/min via INTRAVENOUS
  Filled 2023-04-26 (×4): qty 250

## 2023-04-26 MED ORDER — PRISMASOL BGK 4/2.5 32-4-2.5 MEQ/L EC SOLN
Status: DC
  Filled 2023-04-26 (×8): qty 5000

## 2023-04-26 MED ORDER — PRISMASOL BGK 4/2.5 32-4-2.5 MEQ/L REPLACEMENT SOLN
Status: DC
  Filled 2023-04-26 (×2): qty 5000

## 2023-04-26 MED ORDER — DARBEPOETIN ALFA 150 MCG/0.3ML IJ SOSY
150.0000 ug | PREFILLED_SYRINGE | INTRAMUSCULAR | Status: DC
  Administered 2023-04-26: 150 ug via SUBCUTANEOUS
  Filled 2023-04-26: qty 0.3

## 2023-04-26 MED ORDER — MENTHOL 3 MG MT LOZG
1.0000 | LOZENGE | OROMUCOSAL | Status: DC | PRN
  Filled 2023-04-26: qty 9

## 2023-04-26 MED ORDER — HYDRALAZINE HCL 20 MG/ML IJ SOLN
INTRAMUSCULAR | Status: AC
Start: 2023-04-26 — End: ?
  Filled 2023-04-26: qty 1

## 2023-04-26 SURGICAL SUPPLY — 6 items
KIT CV MULTILUMEN 7FR 20 (SET/KITS/TRAYS/PACK) ×2
KIT CV MULTILUMEN 7FR 20 SUB (SET/KITS/TRAYS/PACK) IMPLANT
KIT FULL HD TRIALYSIS 13X15 (MISCELLANEOUS) IMPLANT
KIT MICROPUNCTURE NIT STIFF (SHEATH) IMPLANT
PACK CARDIAC CATHETERIZATION (CUSTOM PROCEDURE TRAY) IMPLANT
SHEATH PROBE COVER 6X72 (BAG) IMPLANT

## 2023-04-26 NOTE — Progress Notes (Signed)
OT Cancellation Note  Patient Details Name: [name] [name] MRN: [id number] DOB: 21-Jul-1945   Cancelled Treatment:    Reason Eval/Treat Not Completed: Patient at procedure or test/ unavailable (Pt currently in cath lab. Will continue to follow.)  Evern Bio 04/26/2023, 10:00 AM Berna Spare, OTR/L Acute Rehabilitation Services Office: [phone]

## 2023-04-26 NOTE — Interval H&P Note (Signed)
History and Physical Interval Note:  04/26/2023 10:16 AM  [name] [name]  has presented today for surgery, with the diagnosis of congestive heart failure.  The various methods of treatment have been discussed with the patient and family. After consideration of risks, benefits and other options for treatment, the patient has consented to  Procedure(s): CENTRAL LINE INSERTION (N/A) TEMPORARY DIALYSIS CATHETER as a surgical intervention.  The patient's history has been reviewed, patient examined, no change in status, stable for surgery.  I have reviewed the patient's chart and labs.  Questions were answered to the patient's satisfaction.     [name] [name]

## 2023-04-26 NOTE — Progress Notes (Signed)
Initial Nutrition Assessment  DOCUMENTATION CODES:   Not applicable  INTERVENTION:   Liberalize po diet to REGULAR; assist pt as needed at meal times  Ensure Enlive po TID, each supplement provides 350 kcal and 20 grams of protein.  Add Renal MVI BID   NUTRITION DIAGNOSIS:   Inadequate oral intake related to acute illness, poor appetite, chronic illness as evidenced by meal completion < 50%, per patient/family report.  GOAL:   Patient will meet greater than or equal to 90% of their needs  MONITOR:   PO intake, Supplement acceptance, Labs, Weight trends, Skin, I & O's  REASON FOR ASSESSMENT:   Consult Assessment of nutrition requirement/status (CRRT)  ASSESSMENT:   77 yo male admitted from [facility] on 04/20/23 for afib with RVR, acute on chronic systolic heart failure with biventricular involvement, AKI on CKD 3-4. PMH includes DM, CKD, COPD, CAD/CABG, ischemic CM EF 20-25%  Pt is DNR, prognosis poor with possible end stage disease per MD notes.   +shock with levophed at 7. Pt on milrinone and admiodarone gtt as well Creatinine jumped from 3.92 to 5.18 in 24 hours; noted plan to start CRRT but noted pt is not candidate for long term iHD  Currently on Heart Healthy Diet with recorded po 25-50% of meals. Appetite is poor. Fatigued, weak Pt requesting Ensure which RN obtained. RD to order TID  Weight has been trending up with most recent wt of 100.9 kg; dry wt unknown at present. Pt with significant deep pitting edema  Labs: sodium 122 (L), BUN 70, Creatinine 5.18 Meds: reviewed  Diet Order:  HEART HEALTHY   EDUCATION NEEDS:   Not appropriate for education at this time  Skin:  Skin Assessment: Skin Integrity Issues: Skin Integrity Issues:: Unstageable, Stage II Stage II: ankle Unstageable: sacrum  Last BM:  12/27  Height:   Ht Readings from Last 1 Encounters:  04/25/23 6\' 2"  (1.88 m)    Weight:   Wt Readings from Last 1 Encounters:   04/26/23 100.9 kg    BMI:  Body mass index is 28.56 kg/m.  Estimated Nutritional Needs:   Kcal:  2200-2400 kcals  Protein:  125-145 g  Fluid:  1.5 L   Romelle Starcher MS, RDN, LDN, CNSC Registered Dietitian 3 Clinical Nutrition RD Inpatient Contact Info in Amion

## 2023-04-26 NOTE — Progress Notes (Signed)
PT Cancellation Note  Patient Details Name: [name] [name] MRN: [id number] DOB: February 17, 1946   Cancelled Treatment:    Reason Eval/Treat Not Completed: Medical issues which prohibited therapy;Patient at procedure or test/unavailable (Pt was heading for cath lab in AM when went to see and RN reports pt is going to start CRRT when back on unit. Asked to come back another day. Will continue to follow up as able and appropriate.)  Harrel Carina, DPT, CLT  Acute Rehabilitation Services Office: [phone] (Secure chat preferred)   Claudia Desanctis 04/26/2023, 1:14 PM

## 2023-04-26 NOTE — Consult Note (Signed)
[name] Renal Consultation Note  Requesting MD: Bensimhon Indication for Consultation: A on CRF  HPI:  [name] [name] is a 77 y.o. male with many medical issues to include type 2 diabetes mellitus, PAD, history of bladder cancer as well as a significant cardiac history to include coronary artery disease, atrial fibrillation with chronic systolic congestive heart failure status post CCM implant followed by advanced heart failure team  He has had a high lot of issues/hospitalizations of late.  Renal saw him during hospitalization earlier in the month where he suffered A on CRF thought to be in the setting of soft BP on entresto-  renal function did improve after that-  no dialysis required-  crt of 1.8 on 12/14- this was his nadir- went to rehab and then required readmission to the ICU on 12/21.  Put on milrinone and was cardioverted out of Afib  but renal function has continued to worsen-  UOP poor.  Dr. Gala Romney knows patient well-   wonders if maybe a trial of CRRT to offload volume will allow for better cardiac status thus better renal perfusion.  He understands pt would be poor candidate for long term HD-   has also made patient a limited code.  I have spoken with family and patient.  They seem to have a good idea of what is happening here  Creatinine, Ser  Date/Time Value Ref Range Status  04/26/2023 05:29 AM 5.18 (H) 0.61 - 1.24 mg/dL Final  82/95/6213 08:65 AM 3.92 (H) 0.61 - 1.24 mg/dL Final  78/46/9629 52:84 AM 3.32 (H) 0.61 - 1.24 mg/dL Final  13/24/4010 27:25 AM 2.76 (H) 0.61 - 1.24 mg/dL Final  36/64/4034 74:25 PM 2.76 (H) 0.61 - 1.24 mg/dL Final  95/63/8756 43:32 AM 2.91 (H) 0.61 - 1.24 mg/dL Final  95/18/8416 60:63 AM 2.56 (H) 0.61 - 1.24 mg/dL Final  01/60/1093 23:55 PM 2.75 (H) 0.61 - 1.24 mg/dL Final  73/22/0254 27:06 AM 2.46 (H) 0.61 - 1.24 mg/dL Final  23/76/2831 51:76 AM 2.30 (H) 0.61 - 1.24 mg/dL Final  16/10/3708 62:69 AM 2.52 (H) 0.61 - 1.24 mg/dL  Final  48/54/6270 35:00 AM 2.00 (H) 0.61 - 1.24 mg/dL Final  93/81/8299 37:16 AM 2.25 (H) 0.61 - 1.24 mg/dL Final  96/78/9381 01:75 AM 1.84 (H) 0.61 - 1.24 mg/dL Final  02/21/8526 78:24 AM 1.84 (H) 0.61 - 1.24 mg/dL Final  23/53/6144 31:54 AM 1.95 (H) 0.61 - 1.24 mg/dL Final  00/86/7619 50:93 AM 1.89 (H) 0.61 - 1.24 mg/dL Final  26/71/2458 09:98 AM 1.93 (H) 0.61 - 1.24 mg/dL Final  33/82/5053 97:67 AM 2.10 (H) 0.61 - 1.24 mg/dL Final  34/19/3790 24:09 AM 2.21 (H) 0.61 - 1.24 mg/dL Final  73/53/2992 42:68 AM 2.02 (H) 0.61 - 1.24 mg/dL Final  34/19/6222 97:98 AM 2.40 (H) 0.61 - 1.24 mg/dL Final  92/02/9416 40:81 AM 2.23 (H) 0.61 - 1.24 mg/dL Final  44/81/8563 14:97 AM 2.35 (H) 0.61 - 1.24 mg/dL Final  02/63/7858 85:02 AM 2.58 (H) 0.61 - 1.24 mg/dL Final  77/41/2878 67:67 AM 3.43 (H) 0.61 - 1.24 mg/dL Final  20/94/7096 28:36 AM 3.29 (H) 0.61 - 1.24 mg/dL Final  62/94/7654 65:03 AM 3.26 (H) 0.61 - 1.24 mg/dL Final  54/65/6812 75:17 AM 3.13 (H) 0.61 - 1.24 mg/dL Final  00/17/4944 96:75 AM 2.25 (H) 0.61 - 1.24 mg/dL Final  91/63/8466 59:93 PM 1.96 (H) 0.61 - 1.24 mg/dL Final  57/05/7791 90:30 AM 2.36 (H) 0.61 - 1.24 mg/dL Final  01/20/3006 62:26 AM  2.14 (H) 0.61 - 1.24 mg/dL Final  69/62/9528 41:32 AM 2.33 (H) 0.61 - 1.24 mg/dL Final  44/04/270 53:66 AM 2.44 (H) 0.61 - 1.24 mg/dL Final  44/06/4740 59:56 PM 2.52 (H) 0.61 - 1.24 mg/dL Final  38/75/6433 29:51 AM 2.18 (H) 0.61 - 1.24 mg/dL Final  88/41/6606 30:16 PM 2.02 (H) 0.61 - 1.24 mg/dL Final  05/08/3233 57:32 AM 2.12 (H) 0.61 - 1.24 mg/dL Final  20/25/4270 62:37 PM 2.60 (H) 0.61 - 1.24 mg/dL Final  62/83/1517 61:60 PM 2.27 (H) 0.76 - 1.27 mg/dL Final  73/71/0626 94:85 PM 2.27 (H) 0.61 - 1.24 mg/dL Final  46/27/0350 09:38 AM 1.80 (H) 0.76 - 1.27 mg/dL Final  18/29/9371 69:67 PM 2.15 (H) 0.61 - 1.24 mg/dL Final  89/38/1017 51:02 PM 2.15 (H) 0.61 - 1.24 mg/dL Final  58/52/7782 42:35 PM 1.88 (H) 0.61 - 1.24 mg/dL Final  36/14/4315 40:08  PM 1.85 (H) 0.61 - 1.24 mg/dL Final  67/61/9509 32:67 AM 1.84 (H) 0.61 - 1.24 mg/dL Final  12/45/8099 83:38 PM 1.88 (H) 0.61 - 1.24 mg/dL Final  25/08/3974 73:41 AM 1.79 (H) 0.61 - 1.24 mg/dL Final  93/79/0240 97:35 AM 1.66 (H) 0.61 - 1.24 mg/dL Final  32/99/2426 83:41 AM 1.65 (H) 0.61 - 1.24 mg/dL Final     PMHx:   Past Medical History:  Diagnosis Date   AICD (automatic cardioverter/defibrillator) present    Arthritis    Bladder cancer (HCC)    Borderline glaucoma    Chronic systolic (congestive) heart failure (HCC)    Coronary artery disease    Diabetes mellitus type 2, controlled (HCC) ORAL MED   Essential hypertension    Heart murmur    Hepatitis    Medical history non-contributory    Paroxysmal A-fib (HCC)    Paroxysmal A-fib (HCC) 08/20/2020   Peripheral vascular disease (HCC)    Pneumonia     Past Surgical History:  Procedure Laterality Date   CARDIOVERSION N/A 08/10/2021   Procedure: CARDIOVERSION;  Surgeon: Dolores Patty, MD;  Location: [facility] ENDOSCOPY;  Service: Cardiovascular;  Laterality: N/A;   CARDIOVERSION N/A 11/09/2022   Procedure: CARDIOVERSION;  Surgeon: Maisie Fus, MD;  Location: [facility] INVASIVE CV LAB;  Service: Cardiovascular;  Laterality: N/A;   CARDIOVERSION N/A 03/07/2023   Procedure: CARDIOVERSION (CATH LAB);  Surgeon: Tessa Lerner, DO;  Location: [facility] INVASIVE CV LAB;  Service: Cardiovascular;  Laterality: N/A;   CARDIOVERSION N/A 04/22/2023   Procedure: CARDIOVERSION;  Surgeon: Jake Bathe, MD;  Location: [facility] INVASIVE CV LAB;  Service: Cardiovascular;  Laterality: N/A;   CAROTID DUPLEX SCAN  07-26-2010   BILATERAL ICA  STENOSIS 1% - 39%   CCM GENERATOR INSERTION N/A 01/04/2023   Procedure: CCM GENERATOR INSERTION;  Surgeon: Duke Salvia, MD;  Location: [facility] INVASIVE CV LAB;  Service: Cardiovascular;  Laterality: N/A;   CHOLECYSTECTOMY N/A 08/22/2020   Procedure: LAPAROSCOPIC CHOLECYSTECTOMY;  Surgeon: Emelia Loron, MD;  Location: [facility] OR;   Service: General;  Laterality: N/A;   CORONARY ARTERY BYPASS GRAFT N/A 04/08/2017   Procedure: CORONARY ARTERY BYPASS GRAFTING (CABG) TIMES TWO USING LEFT INTERNAL MAMMARY ARTERY AND LEFT SAPHENOUS LEG VEIN HARVESTED ENDOSCOPICALLY.  LEG VEIN ALSO HARVESTED FROM THE RIGHT LEG;  Surgeon: Alleen Borne, MD;  Location: [facility] OR;  Service: Open Heart Surgery;  Laterality: N/A;   CYSTOSCOPY WITH BIOPSY N/A 08/01/2012   Procedure: CYSTOSCOPY WITH BIOPSY BLADDER BIOPSY   ;  Surgeon: Antony Haste, MD;  Location: [facility];  Service: Urology;  Laterality: N/A;   ERCP N/A 08/21/2020   Procedure: ENDOSCOPIC RETROGRADE CHOLANGIOPANCREATOGRAPHY (ERCP);  Surgeon: Jeani Hawking, MD;  Location: [facility] ENDOSCOPY;  Service: Endoscopy;  Laterality: N/A;   FULGURATION OF BLADDER TUMOR N/A 08/01/2012   Procedure: FULGURATION OF BLADDER TUMOR;  Surgeon: Antony Haste, MD;  Location: [facility];  Service: Urology;  Laterality: N/A;   ICD IMPLANT  11/20/2017   ICD IMPLANT N/A 11/20/2017   Procedure: ICD IMPLANT;  Surgeon: Duke Salvia, MD;  Location: [facility] INVASIVE CV LAB;  Service: Cardiovascular;  Laterality: N/A;   IR FLUORO GUIDE CV LINE RIGHT  04/01/2023   IR US GUIDE VASC ACCESS RIGHT  04/01/2023   LEFT HEART CATH AND CORONARY ANGIOGRAPHY N/A 03/31/2017   Procedure: LEFT HEART CATH AND CORONARY ANGIOGRAPHY;  Surgeon: Marykay Lex, MD;  Location: [facility] INVASIVE CV LAB;  Service: Cardiovascular;  Laterality: N/A;   LUMBAR LAMINECTOMY/DECOMPRESSION MICRODISCECTOMY Bilateral 06/16/2019   Procedure: Bilateral Lumbar Four-Five Laminectomy and Foraminotomy;  Surgeon: Julio Sicks, MD;  Location: [facility] OR;  Service: Neurosurgery;  Laterality: Bilateral;  posterior   REMOVAL OF STONES  08/21/2020   Procedure: REMOVAL OF STONES;  Surgeon: Jeani Hawking, MD;  Location: [facility] ENDOSCOPY;  Service: Endoscopy;;   RIGHT HEART CATH Right 03/31/2017   Procedure: RIGHT HEART CATH;  Surgeon: Marykay Lex, MD;  Location: [facility] INVASIVE CV LAB;  Service: Cardiovascular;  Laterality: Right;   RIGHT HEART CATH N/A 04/23/2023   Procedure: RIGHT HEART CATH;  Surgeon: Dolores Patty, MD;  [facility] INVASIVE CV LAB;  Service: Cardiovascular;  Laterality: N/A;   SPHINCTEROTOMY  08/21/2020   Procedure: SPHINCTEROTOMY;  Surgeon: Jeani Hawking, MD;  Location: [facility] ENDOSCOPY;  Service: Endoscopy;;   TEE WITHOUT CARDIOVERSION N/A 04/08/2017   Procedure: TRANSESOPHAGEAL ECHOCARDIOGRAM (TEE);  Surgeon: Alleen Borne, MD;  Location: [facility] OR;  Service: Open Heart Surgery;  Laterality: N/A;   TRANSESOPHAGEAL ECHOCARDIOGRAM (CATH LAB) N/A 03/07/2023   Procedure: TRANSESOPHAGEAL ECHOCARDIOGRAM;  Surgeon: Tessa Lerner, DO;  [facility] INVASIVE CV LAB;  Service: Cardiovascular;  Laterality: N/A;   TRANSURETHRAL RESECTION OF BLADDER TUMOR  05/29/2011   Procedure: TRANSURETHRAL RESECTION OF BLADDER TUMOR (TURBT);  Surgeon: Antony Haste, MD;  Location: [facility];  Service: Urology;  Laterality: N/A;   VENTRICULAR ASSIST DEVICE INSERTION Right 03/31/2017   Procedure: VENTRICULAR ASSIST DEVICE INSERTION;  Surgeon: Marykay Lex, MD;  Location: [facility] INVASIVE CV LAB;  Service: Cardiovascular;  Laterality: Right;    Family Hx:  Family History  Problem Relation Age of Onset   Heart attack Sister    Heart disease Sister     Social History:  reports that he quit smoking about 6 years ago. His smoking use included cigarettes. He started smoking about 46 years ago. He has a 40 pack-year smoking history. He has never used smokeless tobacco. He reports that he does not currently use alcohol. He reports that he does not use drugs.  Allergies:  Allergies  Allergen Reactions   Spironolactone Other (See Comments)    Painful gynecomastia   Semaglutide     Nausea and bloating   Tramadol Other (See Comments)    Sleep disturbance.  Apnea spells.   Empagliflozin Rash    Rash at the  groin Jardiance   Niacin And Related Hives    Medications: Prior to Admission medications   Medication Sig Start Date End Date Taking? Authorizing Provider  acetaminophen (TYLENOL) 500 MG tablet Take 1,000 mg by mouth  every 6 (six) hours as needed for mild pain (pain score 1-3), headache or fever.    [provider]  amiodarone (PACERONE) 200 MG tablet Take 1 tablet (200 mg total) by mouth daily. 04/08/23   Willeen Niece, MD  atorvastatin (LIPITOR) 80 MG tablet TAKE 1 TABLET BY MOUTH EVERY DAY 07/27/22   Bensimhon, Bevelyn Buckles, MD  benzonatate (TESSALON) 200 MG capsule Take 1 capsule (200 mg total) by mouth 3 (three) times daily as needed for cough. 04/08/23   Willeen Niece, MD  cyanocobalamin (VITAMIN B12) 250 MCG tablet Take 500 mcg by mouth daily.    [provider]  ELIQUIS 5 MG TABS tablet TAKE 1 TABLET TWICE A DAY Patient taking differently: Take 5 mg by mouth 2 (two) times daily. 10/03/22   Bensimhon, Bevelyn Buckles, MD  hydrOXYzine (ATARAX) 25 MG tablet Take 1 tablet (25 mg total) by mouth 3 (three) times daily as needed for itching. 04/08/23   Willeen Niece, MD  midodrine (PROAMATINE) 10 MG tablet Take 1 tablet (10 mg total) by mouth 3 (three) times daily with meals. 04/08/23 05/08/23  Willeen Niece, MD  oxyCODONE (OXY IR/ROXICODONE) 5 MG immediate release tablet Take 1 tablet (5 mg total) by mouth every 4 (four) hours as needed for moderate pain (pain score 4-6). 04/08/23   Willeen Niece, MD  oxymetazoline (DRISTAN) 0.05 % nasal spray Place 1 spray into both nostrils at bedtime.    [provider]  sacubitril-valsartan (ENTRESTO) 49-51 MG Take 1 tablet by mouth 2 (two) times daily.    [provider]  torsemide (DEMADEX) 20 MG tablet Take 1 tablet (20 mg total) by mouth daily. 11/21/22   Andrey Farmer, PA-C  zolpidem (AMBIEN) 10 MG tablet Take 5 mg by mouth at bedtime. 12/13/22   [provider]    I have reviewed the patient's current  medications.  Labs:  Results for orders placed or performed during the hospital encounter of 04/20/23 (from the past 48 hours)  Glucose, capillary     Status: Abnormal   Collection Time: 04/24/23  4:34 PM  Result Value Ref Range   Glucose-Capillary 162 (H) 70 - 99 mg/dL    Comment: Glucose reference range applies only to samples taken after fasting for at least 8 hours.  Glucose, capillary     Status: Abnormal   Collection Time: 04/24/23  9:14 PM  Result Value Ref Range   Glucose-Capillary 178 (H) 70 - 99 mg/dL    Comment: Glucose reference range applies only to samples taken after fasting for at least 8 hours.  Basic metabolic panel     Status: Abnormal   Collection Time: 04/25/23  5:15 AM  Result Value Ref Range   Sodium 125 (L) 135 - 145 mmol/L   Potassium 4.3 3.5 - 5.1 mmol/L   Chloride 86 (L) 98 - 111 mmol/L   CO2 26 22 - 32 mmol/L   Glucose, Bld 147 (H) 70 - 99 mg/dL    Comment: Glucose reference range applies only to samples taken after fasting for at least 8 hours.   BUN 61 (H) 8 - 23 mg/dL   Creatinine, Ser 1.61 (H) 0.61 - 1.24 mg/dL   Calcium 8.0 (L) 8.9 - 10.3 mg/dL   GFR, Estimated 15 (L) >60 mL/min    Comment: (NOTE) Calculated using the CKD-EPI Creatinine Equation (2021)    Anion gap 13 5 - 15    Comment: Performed at [facility] Lab, [address] [place],  [place] [id number]  CBC     Status: Abnormal   Collection Time: 04/25/23  5:15 AM  Result Value Ref Range   WBC 18.7 (H) 4.0 - 10.5 K/uL   RBC 3.16 (L) 4.22 - 5.81 MIL/uL   Hemoglobin 8.2 (L) 13.0 - 17.0 g/dL   HCT 47.4 (L) 25.9 - 56.3 %   MCV 78.8 (L) 80.0 - 100.0 fL   MCH 25.9 (L) 26.0 - 34.0 pg   MCHC 32.9 30.0 - 36.0 g/dL   RDW 87.5 (H) 64.3 - 32.9 %   Platelets 632 (H) 150 - 400 K/uL   nRBC 0.0 0.0 - 0.2 %    Comment: Performed at [facility] Lab, [address], [place], [place] [address]  Magnesium     Status: None   Collection Time: 04/25/23  5:15 AM  Result Value Ref Range   Magnesium  2.0 1.7 - 2.4 mg/dL    Comment: Performed at [facility] Lab, [address] [place], [place] [address]  Cooxemetry Panel (carboxy, met, total hgb, O2 sat)     Status: Abnormal   Collection Time: 04/25/23  5:15 AM  Result Value Ref Range   Total hemoglobin 8.3 (L) 12.0 - 16.0 g/dL   O2 Saturation 30.1 %   Carboxyhemoglobin 2.2 (H) 0.5 - 1.5 %   Methemoglobin 1.2 0.0 - 1.5 %    Comment: Performed at [facility] Lab, [address], [place], [place] [address]  Glucose, capillary     Status: Abnormal   Collection Time: 04/25/23  6:05 AM  Result Value Ref Range   Glucose-Capillary 154 (H) 70 - 99 mg/dL    Comment: Glucose reference range applies only to samples taken after fasting for at least 8 hours.  POCT I-Stat EG7     Status: Abnormal   Collection Time: 04/25/23  6:05 AM  Result Value Ref Range   pH, Ven 7.417 7.25 - 7.43   pCO2, Ven 40.9 (L) 44 - 60 mmHg   pO2, Ven 25 (LL) 32 - 45 mmHg   Bicarbonate 26.4 20.0 - 28.0 mmol/L   TCO2 28 22 - 32 mmol/L   O2 Saturation 47 %   Acid-Base Excess 2.0 0.0 - 2.0 mmol/L   Sodium 122 (L) 135 - 145 mmol/L   Potassium 4.1 3.5 - 5.1 mmol/L   Calcium, Ion 1.07 (L) 1.15 - 1.40 mmol/L   HCT 27.0 (L) 39.0 - 52.0 %   Hemoglobin 9.2 (L) 13.0 - 17.0 g/dL   Patient temperature 32.3 C    Collection site art line    Drawn by Nurse    Sample type VENOUS    Comment NOTIFIED PHYSICIAN   Glucose, capillary     Status: Abnormal   Collection Time: 04/25/23  6:11 AM  Result Value Ref Range   Glucose-Capillary 155 (H) 70 - 99 mg/dL    Comment: Glucose reference range applies only to samples taken after fasting for at least 8 hours.  Glucose, capillary     Status: Abnormal   Collection Time: 04/25/23 11:47 AM  Result Value Ref Range   Glucose-Capillary 176 (H) 70 - 99 mg/dL    Comment: Glucose reference range applies only to samples taken after fasting for at least 8 hours.  Glucose, capillary     Status: Abnormal   Collection Time: 04/25/23  4:02  PM  Result Value Ref Range   Glucose-Capillary 140 (H) 70 - 99 mg/dL    Comment: Glucose reference range  applies only to samples taken after fasting for at least 8 hours.  Glucose, capillary     Status: Abnormal   Collection Time: 04/25/23  9:16 PM  Result Value Ref Range   Glucose-Capillary 150 (H) 70 - 99 mg/dL    Comment: Glucose reference range applies only to samples taken after fasting for at least 8 hours.  Basic metabolic panel     Status: Abnormal   Collection Time: 04/26/23  5:29 AM  Result Value Ref Range   Sodium 122 (L) 135 - 145 mmol/L   Potassium 4.7 3.5 - 5.1 mmol/L   Chloride 83 (L) 98 - 111 mmol/L   CO2 24 22 - 32 mmol/L   Glucose, Bld 130 (H) 70 - 99 mg/dL    Comment: Glucose reference range applies only to samples taken after fasting for at least 8 hours.   BUN 70 (H) 8 - 23 mg/dL   Creatinine, Ser 7.56 (H) 0.61 - 1.24 mg/dL   Calcium 7.7 (L) 8.9 - 10.3 mg/dL   GFR, Estimated 11 (L) >60 mL/min    Comment: (NOTE) Calculated using the CKD-EPI Creatinine Equation (2021)    Anion gap 15 5 - 15    Comment: Performed at [facility] Lab, [address], [place], [place] [address]  Magnesium     Status: None   Collection Time: 04/26/23  5:29 AM  Result Value Ref Range   Magnesium 2.0 1.7 - 2.4 mg/dL    Comment: Performed at [facility] Lab, [address], [place], [place] [address]  Cooxemetry Panel (carboxy, met, total hgb, O2 sat)     Status: Abnormal   Collection Time: 04/26/23  5:29 AM  Result Value Ref Range   Total hemoglobin 8.1 (L) 12.0 - 16.0 g/dL   O2 Saturation 16.6 %   Carboxyhemoglobin 0.9 0.5 - 1.5 %   Methemoglobin <0.7 0.0 - 1.5 %    Comment: Performed at [facility] Lab, [address], [place], [place] [address]  CBC     Status: Abnormal   Collection Time: 04/26/23  5:29 AM  Result Value Ref Range   WBC 37.4 (H) 4.0 - 10.5 K/uL   RBC 2.98 (L) 4.22 - 5.81 MIL/uL   Hemoglobin 7.7 (L) 13.0 - 17.0 g/dL    Comment: Reticulocyte  Hemoglobin testing may be clinically indicated, consider ordering this additional test [id number]    HCT 22.9 (L) 39.0 - 52.0 %   MCV 76.8 (L) 80.0 - 100.0 fL   MCH 25.8 (L) 26.0 - 34.0 pg   MCHC 33.6 30.0 - 36.0 g/dL   RDW 55.7 (H) 32.2 - 02.5 %   Platelets 564 (H) 150 - 400 K/uL   nRBC 0.0 0.0 - 0.2 %    Comment: Performed at [facility] Lab, [address], [place], [place] [address]  Glucose, capillary     Status: Abnormal   Collection Time: 04/26/23  6:17 AM  Result Value Ref Range   Glucose-Capillary 137 (H) 70 - 99 mg/dL    Comment: Glucose reference range applies only to samples taken after fasting for at least 8 hours.  Glucose, capillary     Status: Abnormal   Collection Time: 04/26/23 11:30 AM  Result Value Ref Range   Glucose-Capillary 144 (H) 70 - 99 mg/dL    Comment: Glucose reference range applies only to samples taken after fasting for at least 8 hours.  Hemoglobin and hematocrit, blood     Status: Abnormal  Collection Time: 04/26/23 11:40 AM  Result Value Ref Range   Hemoglobin 7.4 (L) 13.0 - 17.0 g/dL   HCT 38.7 (L) 56.4 - 33.2 %    Comment: Performed at [facility] Lab, [address], [place], [place] [address]     ROS:  Review of systems not obtained due to patient factors.  Pt slightly confused-  most conversation was with family   Physical Exam: Vitals:   04/26/23 1400 04/26/23 1415  BP: (!) 105/52 (!) 107/42  Pulse: 76 75  Resp: (!) 22 (!) 23  Temp:    SpO2: 94% 93%     General: pale, somnolent WM-  NAD HEENT: PERRLA, EOMI, mucous membranes moist  Neck:positive for JVD Heart: RRR Lungs: CBS bilat  Abdomen: distended Extremities: pitting edema  Skin:warm and dry Neuro:somnolent but arousable  Assessment/Plan: 77 year old WM with cardiomyopathy and unstable requiring much inpatient  care of late.  Most recently a step back with Afib-  medical care with milrinone and cardioversion was attempted but unfortunately is developing worsening  renal function  1.Renal- as above poor cardiac status-  unstable and in hospital a lot of late.   Most recently has had setback with hypotension and Afib which despite management has led to worsening renal function and worsening volume overload.  Likely due to cardiorenal syndrome. The thought is that if we could do a trial of CRRT-  bring BUN down and improve volume status it may allow for better cardiac function and also give time for renal function to recover.  This is a long shot and everyone is aware but they would like to give it a try.  Anticipate keeping the CRRT going for 5-6 days.  No anticoagulation, 4 K , remove 100 ccs per hour then increase as able.  Everyone is aware that patent is not a long term HD candidate  2. Hypertension/volume  - volume overloaded but also third spacing-  trying to achieve negative volume status with CRRT  3. Hyponatremia-  due to volume overload-  should correct with CRRT 4. Anemia  - now hgb under 8-  is not helping third spacing-  will check iron and give ESA   Cecille Aver 04/26/2023, 2:25 PM

## 2023-04-26 NOTE — Progress Notes (Signed)
PROGRESS NOTE    [name] [name]  ZDG:[id number] DOB: 1945/12/08 DOA: 04/20/2023 PCP: [name], MD   77 y.o. male with COPD, CAD, CABG 2018, ischemic cardiomyopathy EF 20-25%, paroxysmal A-fib, hypertension, type 2 diabetes mellitus, history of LV thrombus.   -Admitted 11/4 w/syncope fall, Afib>cardioverted 11/7, discharged 11/8 -Readmitted 11/25 with increased right hip pain, imaging noted retroperitoneal hematoma extending to distal psoas and iliacus and also volume overloaded, diuresed, followed by nephrology and cards then, started on midodrine, discharged to rehab 12/9 on torsemide -In rehab he had worsening leukocytosis up to 43K, infectious disease consulted 12/10, on 12/12 underwent ultrasound-guided aspiration of right hip, 30 cc of turbid brownish fluid drained, Cx w LACTICASEIBACILLUS CASEI, he was treated with few days of IV vancomycin which does not have efficacy against this, leukocytosis largely spontaneously started improving, vancomycin discontinued 12/16.  Last 2 days had increased shortness of breath, elevated heart rate etc.  -12/21: Readmitted from CIR back to inpatient, 2H with A-fib RVR and CHF -12/21, heart failure team consulted, started on milrinone, Lasix dose increased -12/23 cardioverted to sinus rhythm -12/24: Poor response to diuretics  Subjective: Plan for CVVD  Assessment and Plan:  Acute on chronic systolic CHF, ischemic cardiomyopathy Low output state -CHF team managing, prognosis appears to be poor -CVVD starting 12/27   Atrial fibrillation RVR -On amiodarone, prior cardioversions -Continue IV amiodarone -Per cardiology, sp DCCV 12/23 -Anticoagulation resumed -on apixaban   Retroperitoneal hematoma extending to distal psoas and iliacus -Following mechanical fall -Had profound leukocytosis around 40K in CIR,12/12 underwent ultrasound-guided aspiration of right hip, 30 cc of turbid brownish fluid drained, Cx w LACTICASEIBACILLUS CASEI, he was  treated with few days of IV vancomycin which has no activity against this, leukocytosis largely spontaneously started improving, vancomycin discontinued 12/16, ID signed off -Mild leukocytosis and thrombocytosis persists, afebrile and nontoxic, no abdominal symptoms, monitor -trend h/h   Leukocytosis -See discussion above   CAD  Hx CABG 2018 -Stable   AKI, CKD 3B/4 -Baseline creatinine around 2-2.2 -With low output state, creatinine worsening, see above   Chronic anemia -Relatively stable   Type 2 diabetes mellitus -Recent A1c was 6.9 -On Ozempic at baseline, off metformin now -CBGs are stable, continue SSI,   Severe hypoalbuminemia -No clear history of cirrhosis -?  Malnutrition alone versus combination with chronic liver disease -Dietitian consult   GOALS: 77/M with severe cardiomyopathy, A-fib RVR, severe hypoalbuminemia and third spacing, retroperitoneal hematoma, poor response to milrinone and high-dose Lasix, prognosis is poor-- GOC discussion per Dr. B  DVT prophylaxis: Apixaban Code Status: Full code Disposition Plan: Remain in ICU      Objective: Vitals:   04/26/23 1034 04/26/23 1039 04/26/23 1044 04/26/23 1115  BP: (!) 107/44 (!) 104/48 (!) 111/49 (!) 96/57  Pulse: 77 77 76 74  Resp: 14 15 13 15   Temp:      TempSrc:      SpO2: 98% 97% 92% 93%  Weight:      Height:        Intake/Output Summary (Last 24 hours) at 04/26/2023 1141 Last data filed at 04/26/2023 0900 Gross per 24 hour  Intake 1050.12 ml  Output 25 ml  Net 1025.12 ml   Filed Weights   04/24/23 0600 04/25/23 0520 04/26/23 0500  Weight: 94.4 kg 96.8 kg 100.9 kg    Examination:   General: Appearance:     Overweight male with mild increase work of breathing     Lungs:    respirations  unlabored  Heart:    Normal heart rate. .   MS:   All extremities are intact.   Neurologic:   Awake, alert       Data Reviewed:   CBC: Recent Labs  Lab 04/23/23 0326 04/23/23 1241  04/24/23 0505 04/24/23 0924 04/24/23 1327 04/25/23 0515 04/25/23 0605 04/26/23 0529  WBC 15.6*  --  12.9*  --  15.6* 18.7*  --  37.4*  HGB 9.4*   < > 8.5* 10.2* 8.2* 8.2* 9.2* 7.7*  HCT 28.4*   < > 26.3* 30.0* 25.2* 24.9* 27.0* 22.9*  MCV 79.6*  --  79.9*  --  79.5* 78.8*  --  76.8*  PLT 771*  --  671*  --  657* 632*  --  564*   < > = values in this interval not displayed.   Basic Metabolic Panel: Recent Labs  Lab 04/22/23 0323 04/22/23 2216 04/23/23 0326 04/23/23 1241 04/24/23 0505 04/24/23 0924 04/25/23 0515 04/25/23 0605 04/26/23 0529  NA 128* 129* 127*   < > 127* 125* 125* 122* 122*  K 3.7 4.4 3.9   < > 3.8 3.8 4.3 4.1 4.7  CL 92* 91* 87*  --  88*  --  86*  --  83*  CO2 24 25 25   --  26  --  26  --  24  GLUCOSE 145* 173* 155*  --  127*  --  147*  --  130*  BUN 47* 51* 52*  --  55*  --  61*  --  70*  CREATININE 2.91* 2.76* 2.76*  --  3.32*  --  3.92*  --  5.18*  CALCIUM 7.8* 8.1* 8.1*  --  8.0*  --  8.0*  --  7.7*  MG 2.2  --  2.1  --  2.0  --  2.0  --  2.0   < > = values in this interval not displayed.   GFR: Estimated Creatinine Clearance: 15.2 mL/min (A) (by C-G formula based on SCr of 5.18 mg/dL (H)). Liver Function Tests: Recent Labs  Lab 04/20/23 0847  AST 70*  ALT 45*  ALKPHOS 156*  BILITOT 1.3*  PROT 5.7*  ALBUMIN 2.0*   No results for input(s): "LIPASE", "AMYLASE" in the last 168 hours. No results for input(s): "AMMONIA" in the last 168 hours. Coagulation Profile: No results for input(s): "INR", "PROTIME" in the last 168 hours. Cardiac Enzymes: No results for input(s): "CKTOTAL", "CKMB", "CKMBINDEX", "TROPONINI" in the last 168 hours. BNP (last 3 results) Recent Labs    09/25/22 0953  PROBNP 2,187*   HbA1C: No results for input(s): "HGBA1C" in the last 72 hours. CBG: Recent Labs  Lab 04/25/23 1147 04/25/23 1602 04/25/23 2116 04/26/23 0617 04/26/23 1130  GLUCAP 176* 140* 150* 137* 144*   Lipid Profile: No results for input(s):  "CHOL", "HDL", "LDLCALC", "TRIG", "CHOLHDL", "LDLDIRECT" in the last 72 hours. Thyroid Function Tests: No results for input(s): "TSH", "T4TOTAL", "FREET4", "T3FREE", "THYROIDAB" in the last 72 hours.  Anemia Panel: No results for input(s): "VITAMINB12", "FOLATE", "FERRITIN", "TIBC", "IRON", "RETICCTPCT" in the last 72 hours. Urine analysis:    Component Value Date/Time   COLORURINE YELLOW 04/07/2023 1810   APPEARANCEUR CLEAR 04/07/2023 1810   LABSPEC 1.009 04/07/2023 1810   PHURINE 5.0 04/07/2023 1810   GLUCOSEU NEGATIVE 04/07/2023 1810   HGBUR NEGATIVE 04/07/2023 1810   BILIRUBINUR NEGATIVE 04/07/2023 1810   KETONESUR NEGATIVE 04/07/2023 1810   PROTEINUR NEGATIVE 04/07/2023 1810   NITRITE NEGATIVE 04/07/2023 1810  LEUKOCYTESUR NEGATIVE 04/07/2023 1810    Recent Results (from the past 240 hours)  MRSA Next Gen by PCR, Nasal     Status: None   Collection Time: 04/20/23 10:29 AM   Specimen: Nasal Mucosa; Nasal Swab  Result Value Ref Range Status   MRSA by PCR Next Gen NOT DETECTED NOT DETECTED Final    Comment: (NOTE) The GeneXpert MRSA Assay (FDA approved for NASAL specimens only), is one component of a comprehensive MRSA colonization surveillance program. It is not intended to diagnose MRSA infection nor to guide or monitor treatment for MRSA infections. Test performance is not FDA approved in patients less than 42 years old. Performed at [facility], [address], [place], [place] [address]      Radiology Studies: DG CHEST PORT 1 VIEW Result Date: 04/26/2023 CLINICAL DATA:  Shortness of breath. EXAM: PORTABLE CHEST 1 VIEW COMPARISON:  AP chest 04/23/2023, 04/22/2023, 04/19/2023 FINDINGS: Redemonstration of median sternotomy. Left chest wall cardiac pacer and right chest wall AICD and leads are grossly unchanged. Right internal jugular Swan-Ganz catheter with tip overlying the pulmonary outflow tract is unchanged. Cardiac silhouette is again mildly enlarged.  Mediastinal contours are unchanged. Moderate to high-grade atherosclerotic calcifications within the aortic arch. Blunting of left greater than right costophrenic angles with small pleural effusions, similar to prior. No pneumothorax is seen. Moderate diffuse bilateral interstitial thickening is similar to multiple prior radiographs including 04/02/2023 but slightly increased from 01/16/2023, likely reflecting mild-to-moderate interstitial pulmonary edema. No acute skeletal abnormality. There is elevation of the right clavicular head with respect to the acromion, unchanged from 04/09/2023 radiograph. IMPRESSION: 1. Unchanged Swan-Ganz catheter position. 2. Unchanged mild cardiomegaly and mild-to-moderate interstitial pulmonary edema. 3. Unchanged small bilateral pleural effusions. Electronically Signed   By: Neita Garnet M.D.   On: 04/26/2023 10:24     Scheduled Meds:  acetaminophen  1,000 mg Oral TID   apixaban  5 mg Oral BID   atorvastatin  80 mg Oral Daily   calcium-vitamin D  1 tablet Oral BID WC   Chlorhexidine Gluconate Cloth  6 each Topical Daily   Gerhardt's butt cream   Topical BID   insulin aspart  0-15 Units Subcutaneous TID WC   insulin aspart  0-5 Units Subcutaneous QHS   leptospermum manuka honey  1 Application Topical Daily   polyethylene glycol  17 g Oral Daily   senna-docusate  2 tablet Oral BID   sodium bicarbonate  650 mg Oral BID   sodium chloride flush  3 mL Intravenous Q12H   Continuous Infusions:   prismasol BGK 4/2.5 400 mL/hr at 04/26/23 1111    prismasol BGK 4/2.5 400 mL/hr at 04/26/23 1110   sodium chloride 10 mL/hr at 04/26/23 0900   amiodarone 30 mg/hr (04/26/23 1105)   milrinone 0.25 mcg/kg/min (04/26/23 1105)   norepinephrine (LEVOPHED) Adult infusion 4 mcg/min (04/26/23 1105)   prismasol BGK 4/2.5 1,500 mL/hr at 04/26/23 1111     LOS: 6 days    Time spent:    Marlin Canary DO [facility]   04/26/2023, 11:41 AM

## 2023-04-26 NOTE — Progress Notes (Addendum)
Advanced Heart Failure Rounding Note  Cardiologist: Arvilla Meres, MD   Subjective:   Chief Complaint: dyspnea and fatigue   12/22: started on empiric milrinone 0.125 + high dose lasix, 160 IV x 2 12/23: s/p DCCV>>NSR. Milrinone increased to 0.25.  12/24: RHC 12/24 (On milrinone 0.25 mcg/kg/min) RA = 14 RV = 58/18 PA = 56/25 (40) PCW = 28 with v waves to 45 Fick cardiac output/index = 5.8/2.6 TD CO/CI = 4.7/2.1 PVR = 2.1 (Fick) 2.6 (TD) Ao sat = 97% PA sat = 46%, 48% PAPi = 2.2 Lasix gtt increased to 30  12/25: Midodrine discontinued. Started on NE   Continues on Milrinone 0.25 + NE 4. Co-ox 45%. Increased O2 requirements overnight, now on 12L/[place]. CVP 15.   SCr 2.76>>3.32>>3.92>>5.18  BUN 61>>70 K 4.7 Na 122  Swan #s  CVP 15 PAP 43/18 CI 2.5 Co-ox 45%  Sitting up in bed. Complains of fatigue. Wants to trial HD. Wife at bedside.    Objective:   Weight Range: 100.9 kg Body mass index is 28.56 kg/m.   Vital Signs:   Temp:  [97.7 F (36.5 C)-101.1 F (38.4 C)] 97.7 F (36.5 C) (12/27 0815) Pulse Rate:  [67-93] 82 (12/27 0815) Resp:  [9-25] 17 (12/27 0815) BP: (76-132)/(37-109) 110/49 (12/27 0800) SpO2:  [65 %-97 %] 93 % (12/27 0815) Weight:  [100.9 kg] 100.9 kg (12/27 0500) Last BM Date : 04/26/23  Weight change: Filed Weights   04/24/23 0600 04/25/23 0520 04/26/23 0500  Weight: 94.4 kg 96.8 kg 100.9 kg    Intake/Output:   Intake/Output Summary (Last 24 hours) at 04/26/2023 0843 Last data filed at 04/26/2023 0800 Gross per 24 hour  Intake 1365.76 ml  Output 25 ml  Net 1340.76 ml      Physical Exam    CVp 15 General:  weak/fatigued appearing. No respiratory difficulty HEENT: normal Neck: supple. JVD elevated to jaw. Carotids 2+ bilat; no bruits. No lymphadenopathy or thyromegaly appreciated. Cor: PMI nondisplaced. Regular rate & rhythm. No rubs, gallops or murmurs. Lungs: decreased BS at the bases bilaterally  Abdomen: soft,  nontender, nondistended. No hepatosplenomegaly. No bruits or masses. Good bowel sounds. Extremities: no cyanosis, clubbing, rash, 2-3+ b/l LE edema up to thighs Neuro: alert & oriented x 3, cranial nerves grossly intact. moves all 4 extremities w/o difficulty. Affect pleasant. GU: +foley    Telemetry   NSR 70-80s Personally reviewed  Labs    CBC Recent Labs    04/25/23 0515 04/25/23 0605 04/26/23 0529  WBC 18.7*  --  37.4*  HGB 8.2* 9.2* 7.7*  HCT 24.9* 27.0* 22.9*  MCV 78.8*  --  76.8*  PLT 632*  --  564*   Basic Metabolic Panel Recent Labs    16/10/96 0515 04/25/23 0605 04/26/23 0529  NA 125* 122* 122*  K 4.3 4.1 4.7  CL 86*  --  83*  CO2 26  --  24  GLUCOSE 147*  --  130*  BUN 61*  --  70*  CREATININE 3.92*  --  5.18*  CALCIUM 8.0*  --  7.7*  MG 2.0  --  2.0   Liver Function Tests No results for input(s): "AST", "ALT", "ALKPHOS", "BILITOT", "PROT", "ALBUMIN" in the last 72 hours.  No results for input(s): "LIPASE", "AMYLASE" in the last 72 hours. Cardiac Enzymes No results for input(s): "CKTOTAL", "CKMB", "CKMBINDEX", "TROPONINI" in the last 72 hours.  BNP: BNP (last 3 results) Recent Labs    04/20/23 0432 04/20/23 2119  04/21/23 0921  BNP 2,698.9* 2,078.7* 1,292.0*    ProBNP (last 3 results) Recent Labs    09/25/22 0953  PROBNP 2,187*     D-Dimer No results for input(s): "DDIMER" in the last 72 hours. Hemoglobin A1C No results for input(s): "HGBA1C" in the last 72 hours. Fasting Lipid Panel No results for input(s): "CHOL", "HDL", "LDLCALC", "TRIG", "CHOLHDL", "LDLDIRECT" in the last 72 hours. Thyroid Function Tests No results for input(s): "TSH", "T4TOTAL", "T3FREE", "THYROIDAB" in the last 72 hours.  Invalid input(s): "FREET3"   Other results:   Imaging    No results found.    Medications:     Scheduled Medications:  acetaminophen  1,000 mg Oral TID   apixaban  5 mg Oral BID   atorvastatin  80 mg Oral Daily    calcium-vitamin D  1 tablet Oral BID WC   Chlorhexidine Gluconate Cloth  6 each Topical Daily   Gerhardt's butt cream   Topical BID   insulin aspart  0-15 Units Subcutaneous TID WC   insulin aspart  0-5 Units Subcutaneous QHS   leptospermum manuka honey  1 Application Topical Daily   polyethylene glycol  17 g Oral Daily   senna-docusate  2 tablet Oral BID   sodium bicarbonate  650 mg Oral BID   sodium chloride flush  3 mL Intravenous Q12H    Infusions:  sodium chloride 10 mL/hr at 04/26/23 0800   amiodarone 30 mg/hr (04/26/23 0800)   milrinone 0.25 mcg/kg/min (04/26/23 0800)   norepinephrine (LEVOPHED) Adult infusion 4 mcg/min (04/26/23 0800)    PRN Medications: sodium chloride, acetaminophen, magnesium hydroxide, methocarbamol, ondansetron (ZOFRAN) IV, mouth rinse, oxyCODONE, sodium chloride flush, zolpidem    Patient Profile   77 y.o. 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 Readmitted on 12/21 for recurrent afib w/ RVR and a/c CHF w/ voulume overload and low-output, requiring inotropic support w/ milrinone.   Assessment/Plan   1. Acute on chronic systolic heart failure with biventricular involvement: Advanced disease, previously required milrinone for diuresis during his last admission.  Was discharged on midodrine without significant GDMT. Now readmitted w/ volume overload in setting of recurrent Afib w/ RVR  - Echo 11/24 EF 20-25% RV mild to moderately down - continues with poor urinary response to high dose Lasix gtt despite empiric milrinone  and NE support - suspect severe cardiorenal syndrome - now w/  anuric renal failure, worsening fluid overload and acute hypoxic respiratory failure.  He wants to trial iHD    - plan placement of temp HD cath today  - consult nephrology  - GDMT limited by worsening renal failure and hypotension  -I am concerned he is nearing end-stage. We discussed GOC. Now aggressive care but DNR/DNI in case of worsening status  2. Atrial fibrillation with RVR: failed recent DCCV. Back in AFib on admit, suspect triggered by worsening volume overload and recent infection  - s/p DCCV 12/23, back in NSR  -Continue IV amiodarone while on milrinone, 30/hr   -Eliquis 5 mg bid  -No bleeding  3. CAD: CABG 2018 - stable  - no s/s angina - apixaban, statin   4. AKI on CKD IIIb-IV:  - cardiorenal/worsened by low- output  -  Limits ability to diuresis, advanced therapies - b/l SCr ~1.8-2.0 - SCr up to 5.8 today, now anuric  - inotrope's per above - [facility] cath today, trial iHD - consult nephrology    5. Diabetes: - per IM  - SSI   6. Severe malnutrition - albumin 1.7 on admit  - per IM   7. Iron-deficiency anemia - hgb has dropped  about 2 points over last week. No overt bleeding sites - MCV is low - Check iron stores  8 DNR/DNI - discussion documented   Length of Stay: [address] Sharol Harness, PA-C  04/26/2023, 8:43 AM  Advanced Heart Failure Team Pager [phone] (M-F; 7a - 5p)  Please contact [facility] for night-coverage after hours (5p -7a ) and weekends on [email or web address]  Patient seen and examined with the above-signed Advanced Practice Provider and/or Housestaff. I personally reviewed laboratory data, imaging studies and relevant notes. I independently examined the patient and formulated the important aspects of the plan. I have edited the note to reflect any of my changes or salient points. I have personally discussed the plan with the patient and/or family.  Remains on milrinone and NE. More SOB today. Lasix on hold due to progressive AKI. Swan #s  marginal  A bit lethargic but no fran uremia  General:  Ill appearing. + SOB HEENT: normal Neck: supple. RIJ swan Carotids 2+ bilat; no bruits. No lymphadenopathy or thryomegaly appreciated. Cor: Regular rate & rhythm. No rubs, gallops or murmurs. Lungs: clear Abdomen: soft, nontender, nondistended. No hepatosplenomegaly. No bruits or masses. Good bowel sounds. Extremities: no cyanosis, clubbing, rash, 3+  edema Neuro:lethargic but oriented , cranial nerves grossly intact. moves all 4 extremities w/o difficulty. Affect pleasant  Continues to deteriorate with end-stage cardiorenal syndrome combined with progressive fraility/malnutrition.  Long talk about GOC. Wants to try short-course of CVVHD to see if it can help. He understands he is not candidate for long-term HD.  D/w Renal who will see today. We will place CVVHD catheter.  CRITICAL CARE Performed by: Arvilla Meres  Total critical care time: 45 minutes  Critical care time was exclusive of separately billable procedures and treating other patients.  Critical care was necessary to treat or prevent imminent or life-threatening deterioration.  Critical care was time spent personally by me (independent of midlevel providers or residents) on the following activities: development of treatment plan with patient and/or surrogate as well as nursing, discussions with consultants, evaluation of patient's response to treatment, examination of patient, obtaining history from patient or surrogate, ordering and performing treatments and interventions, ordering and review of laboratory studies, ordering and review of radiographic studies, pulse oximetry and re-evaluation of patient's condition.  Arvilla Meres, MD  11:29 AM

## 2023-04-27 LAB — RENAL FUNCTION PANEL
Albumin: 1.6 g/dL — ABNORMAL LOW (ref 3.5–5.0)
Albumin: 1.5 g/dL — ABNORMAL LOW (ref 3.5–5.0)
Anion gap: 11 (ref 5–15)
Anion gap: 10 (ref 5–15)
BUN: 36 mg/dL — ABNORMAL HIGH (ref 8–23)
BUN: 25 mg/dL — ABNORMAL HIGH (ref 8–23)
CO2: 23 mmol/L (ref 22–32)
CO2: 24 mmol/L (ref 22–32)
Calcium: 7.7 mg/dL — ABNORMAL LOW (ref 8.9–10.3)
Calcium: 7.7 mg/dL — ABNORMAL LOW (ref 8.9–10.3)
Chloride: 94 mmol/L — ABNORMAL LOW (ref 98–111)
Chloride: 95 mmol/L — ABNORMAL LOW (ref 98–111)
Creatinine, Ser: 2.81 mg/dL — ABNORMAL HIGH (ref 0.61–1.24)
Creatinine, Ser: 2.04 mg/dL — ABNORMAL HIGH (ref 0.61–1.24)
GFR, Estimated: 22 mL/min — ABNORMAL LOW (ref >60)
GFR, Estimated: 33 mL/min — ABNORMAL LOW (ref >60)
Glucose, Bld: 134 mg/dL — ABNORMAL HIGH (ref 70–99)
Glucose, Bld: 172 mg/dL — ABNORMAL HIGH (ref 70–99)
Phosphorus: 3.4 mg/dL (ref 2.5–4.6)
Phosphorus: 2.2 mg/dL — ABNORMAL LOW (ref 2.5–4.6)
Potassium: 4.3 mmol/L (ref 3.5–5.1)
Potassium: 4.2 mmol/L (ref 3.5–5.1)
Sodium: 128 mmol/L — ABNORMAL LOW (ref 135–145)
Sodium: 129 mmol/L — ABNORMAL LOW (ref 135–145)

## 2023-04-27 LAB — MAGNESIUM: Magnesium: 2.1 mg/dL (ref 1.7–2.4)

## 2023-04-27 LAB — IRON AND TIBC
Iron: 5 ug/dL — ABNORMAL LOW (ref 45–182)
Saturation Ratios: 3 % — ABNORMAL LOW (ref 17.9–39.5)
TIBC: 171 ug/dL — ABNORMAL LOW (ref 250–450)
UIBC: 166 ug/dL

## 2023-04-27 LAB — GLUCOSE, CAPILLARY
Glucose-Capillary: 136 mg/dL — ABNORMAL HIGH (ref 70–99)
Glucose-Capillary: 167 mg/dL — ABNORMAL HIGH (ref 70–99)
Glucose-Capillary: 171 mg/dL — ABNORMAL HIGH (ref 70–99)
Glucose-Capillary: 151 mg/dL — ABNORMAL HIGH (ref 70–99)

## 2023-04-27 LAB — CBC
HCT: 22.5 % — ABNORMAL LOW (ref 39.0–52.0)
Hemoglobin: 7.5 g/dL — ABNORMAL LOW (ref 13.0–17.0)
MCH: 25.6 pg — ABNORMAL LOW (ref 26.0–34.0)
MCHC: 33.3 g/dL (ref 30.0–36.0)
MCV: 76.8 fL — ABNORMAL LOW (ref 80.0–100.0)
Platelets: 542 10*3/uL — ABNORMAL HIGH (ref 150–400)
RBC: 2.93 MIL/uL — ABNORMAL LOW (ref 4.22–5.81)
RDW: 20.3 % — ABNORMAL HIGH (ref 11.5–15.5)
WBC: 29.5 10*3/uL — ABNORMAL HIGH (ref 4.0–10.5)
nRBC: 0 % (ref 0.0–0.2)

## 2023-04-27 LAB — COOXEMETRY PANEL
Carboxyhemoglobin: 1.3 % (ref 0.5–1.5)
Methemoglobin: <0.7 % (ref 0.0–1.5)
O2 Saturation: 57.8 %
Total hemoglobin: 7.7 g/dL — ABNORMAL LOW (ref 12.0–16.0)

## 2023-04-27 LAB — FERRITIN: Ferritin: 203 ng/mL (ref 24–336)

## 2023-04-27 MED ORDER — SODIUM CHLORIDE 0.9 % IV SOLN
200.0000 mg | INTRAVENOUS | Status: DC
  Administered 2023-04-27 – 2023-04-29 (×3): 200 mg via INTRAVENOUS
  Filled 2023-04-27 (×3): qty 10

## 2023-04-27 MED ORDER — SODIUM PHOSPHATES 45 MMOLE/15ML IV SOLN
30.0000 mmol | Freq: Once | INTRAVENOUS | Status: AC
  Administered 2023-04-27: 30 mmol via INTRAVENOUS
  Filled 2023-04-27: qty 10

## 2023-04-27 MED ORDER — AYR SALINE NASAL NA GEL
1.0000 | NASAL | Status: DC | PRN
  Administered 2023-04-27 – 2023-04-28 (×4): 1 via NASAL
  Filled 2023-04-27: qty 14.1

## 2023-04-27 MED ORDER — IRON SUCROSE 200 MG IVPB - SIMPLE MED
200.0000 mg | Status: DC
  Filled 2023-04-27: qty 110

## 2023-04-27 NOTE — Progress Notes (Signed)
PROGRESS NOTE    [name] [name]  WUJ:[id number] DOB: [birth year] DOA: 04/20/2023 PCP: [name], MD   77 y.o. male with COPD, CAD, CABG 2018, ischemic cardiomyopathy EF 20-25%, paroxysmal A-fib, hypertension, type 2 diabetes mellitus, history of LV thrombus.   -Admitted 11/4 w/syncope fall, Afib>cardioverted 11/7, discharged 11/8 -Readmitted 11/25 with increased right hip pain, imaging noted retroperitoneal hematoma extending to distal psoas and iliacus and also volume overloaded, diuresed, followed by nephrology and cards then, started on midodrine, discharged to rehab 12/9 on torsemide -In rehab he had worsening leukocytosis up to 43K, infectious disease consulted 12/10, on 12/12 underwent ultrasound-guided aspiration of right hip, 30 cc of turbid brownish fluid drained, Cx w LACTICASEIBACILLUS CASEI, he was treated with few days of IV vancomycin which does not have efficacy against this, leukocytosis largely spontaneously started improving, vancomycin discontinued 12/16.  Last 2 days had increased shortness of breath, elevated heart rate etc.  -12/21: Readmitted from CIR back to inpatient, 2H with A-fib RVR and CHF -12/21, heart failure team consulted, started on milrinone, Lasix dose increased -12/23 cardioverted to sinus rhythm -12/24: Poor response to diuretics 12/27- CVVD started  Subjective: Tolerated CVVD starting, denies abdominal pain  Assessment and Plan:  Acute on chronic systolic CHF, ischemic cardiomyopathy Low output state -CHF team managing, prognosis appears to be poor -CVVD starting 12/27   Atrial fibrillation RVR -On amiodarone, prior cardioversions -Continue IV amiodarone -Per cardiology, sp DCCV 12/23 -Anticoagulation resumed -on apixaban   Retroperitoneal hematoma extending to distal psoas and iliacus -Following mechanical fall -Had profound leukocytosis around 40K in CIR,12/12 underwent ultrasound-guided aspiration of right hip, 30 cc of turbid  brownish fluid drained, Cx w LACTICASEIBACILLUS CASEI, he was treated with few days of IV vancomycin which has no activity against this, leukocytosis largely spontaneously started improving, vancomycin discontinued 12/16, ID signed off -Mild leukocytosis and thrombocytosis persists, afebrile and nontoxic, no abdominal symptoms, monitor -consider repeating CT scan of abdomen if WBC increased further, afebrile so reassuring   Leukocytosis -See discussion above   CAD  Hx CABG 2018 -Stable   AKI, CKD 3B/4 -Baseline creatinine around 2-2.2 -on CVVD   Chronic anemia -Relatively stable   Type 2 diabetes mellitus -Recent A1c was 6.9 -On Ozempic at baseline, off metformin now -CBGs are stable, continue SSI,   Severe hypoalbuminemia -No clear history of cirrhosis -?  Malnutrition alone versus combination with chronic liver disease -Dietitian consult   GOALS: 77/M with severe cardiomyopathy, A-fib RVR, severe hypoalbuminemia and third spacing, retroperitoneal hematoma, poor response to milrinone and high-dose Lasix, prognosis is poor-- GOC discussion per CHF team  DVT prophylaxis: Apixaban Code Status: DNR Disposition Plan: Remain in ICU      Objective: Vitals:   04/27/23 1315 04/27/23 1330 04/27/23 1345 04/27/23 1400  BP:  (!) 95/43 (!) 102/41 (!) 105/49  Pulse: 82 82 78 80  Resp: (!) 23 15 12 16   Temp:      TempSrc:      SpO2: 91% 93% 93% 92%  Weight:      Height:        Intake/Output Summary (Last 24 hours) at 04/27/2023 1406 Last data filed at 04/27/2023 1400 Gross per 24 hour  Intake 1373.96 ml  Output 3711 ml  Net -2337.04 ml   Filed Weights   04/25/23 0520 04/26/23 0500 04/27/23 0500  Weight: 96.8 kg 100.9 kg 97.3 kg    Examination:    General: Appearance:     Overweight male in no acute distress  Lungs:     respirations unlabored  Heart:    Normal heart rate..   MS:   All extremities are intact.   Neurologic:   Awake, alert      Data  Reviewed:   CBC: Recent Labs  Lab 04/24/23 0505 04/24/23 0924 04/24/23 1327 04/25/23 0515 04/25/23 0605 04/26/23 0529 04/26/23 1140 04/27/23 0358  WBC 12.9*  --  15.6* 18.7*  --  37.4*  --  29.5*  HGB 8.5*   < > 8.2* 8.2* 9.2* 7.7* 7.4* 7.5*  HCT 26.3*   < > 25.2* 24.9* 27.0* 22.9* 22.3* 22.5*  MCV 79.9*  --  79.5* 78.8*  --  76.8*  --  76.8*  PLT 671*  --  657* 632*  --  564*  --  542*   < > = values in this interval not displayed.   Basic Metabolic Panel: Recent Labs  Lab 04/23/23 0326 04/23/23 1241 04/24/23 0505 04/24/23 0924 04/25/23 0515 04/25/23 0605 04/26/23 0529 04/26/23 1536 04/27/23 0358  NA 127*   < > 127*   < > 125* 122* 122* 124* 128*  K 3.9   < > 3.8   < > 4.3 4.1 4.7 4.5 4.3  CL 87*  --  88*  --  86*  --  83* 87* 94*  CO2 25  --  26  --  26  --  24 26 23   GLUCOSE 155*  --  127*  --  147*  --  130* 163* 134*  BUN 52*  --  55*  --  61*  --  70* 58* 36*  CREATININE 2.76*  --  3.32*  --  3.92*  --  5.18* 4.38* 2.81*  CALCIUM 8.1*  --  8.0*  --  8.0*  --  7.7* 7.7* 7.7*  MG 2.1  --  2.0  --  2.0  --  2.0  --  2.1  PHOS  --   --   --   --   --   --   --  5.6* 3.4   < > = values in this interval not displayed.   GFR: Estimated Creatinine Clearance: 25.6 mL/min (A) (by C-G formula based on SCr of 2.81 mg/dL (H)). Liver Function Tests: Recent Labs  Lab 04/26/23 1536 04/27/23 0358  ALBUMIN 1.5* 1.6*   No results for input(s): "LIPASE", "AMYLASE" in the last 168 hours. No results for input(s): "AMMONIA" in the last 168 hours. Coagulation Profile: No results for input(s): "INR", "PROTIME" in the last 168 hours. Cardiac Enzymes: No results for input(s): "CKTOTAL", "CKMB", "CKMBINDEX", "TROPONINI" in the last 168 hours. BNP (last 3 results) Recent Labs    09/25/22 0953  PROBNP 2,187*   HbA1C: No results for input(s): "HGBA1C" in the last 72 hours. CBG: Recent Labs  Lab 04/26/23 1130 04/26/23 1623 04/26/23 2114 04/27/23 0634 04/27/23 1226   GLUCAP 144* 146* 176* 136* 167*   Lipid Profile: No results for input(s): "CHOL", "HDL", "LDLCALC", "TRIG", "CHOLHDL", "LDLDIRECT" in the last 72 hours. Thyroid Function Tests: No results for input(s): "TSH", "T4TOTAL", "FREET4", "T3FREE", "THYROIDAB" in the last 72 hours.  Anemia Panel: Recent Labs    04/27/23 0358  FERRITIN 203  TIBC 171*  IRON 5*   Urine analysis:    Component Value Date/Time   COLORURINE YELLOW 04/07/2023 1810   APPEARANCEUR CLEAR 04/07/2023 1810   LABSPEC 1.009 04/07/2023 1810   PHURINE 5.0 04/07/2023 1810   GLUCOSEU NEGATIVE 04/07/2023 1810   HGBUR NEGATIVE 04/07/2023  1810   BILIRUBINUR NEGATIVE 04/07/2023 1810   KETONESUR NEGATIVE 04/07/2023 1810   PROTEINUR NEGATIVE 04/07/2023 1810   NITRITE NEGATIVE 04/07/2023 1810   LEUKOCYTESUR NEGATIVE 04/07/2023 1810    Recent Results (from the past 240 hours)  MRSA Next Gen by PCR, Nasal     Status: None   Collection Time: 04/20/23 10:29 AM   Specimen: Nasal Mucosa; Nasal Swab  Result Value Ref Range Status   MRSA by PCR Next Gen NOT DETECTED NOT DETECTED Final    Comment: (NOTE) The GeneXpert MRSA Assay (FDA approved for NASAL specimens only), is one component of a comprehensive MRSA colonization surveillance program. It is not intended to diagnose MRSA infection nor to guide or monitor treatment for MRSA infections. Test performance is not FDA approved in patients less than 72 years old. Performed at [facility], [address] [place], [place] [address]      Radiology Studies: PERIPHERAL VASCULAR CATHETERIZATION Result Date: 04/26/2023 Successful HD catheter placement placement.   CARDIAC CATHETERIZATION Result Date: 04/26/2023 Successful central line placement.   DG CHEST PORT 1 VIEW Result Date: 04/26/2023 CLINICAL DATA:  Shortness of breath. EXAM: PORTABLE CHEST 1 VIEW COMPARISON:  AP chest 04/23/2023, 04/22/2023, 04/19/2023 FINDINGS: Redemonstration of median sternotomy. Left  chest wall cardiac pacer and right chest wall AICD and leads are grossly unchanged. Right internal jugular Swan-Ganz catheter with tip overlying the pulmonary outflow tract is unchanged. Cardiac silhouette is again mildly enlarged. Mediastinal contours are unchanged. Moderate to high-grade atherosclerotic calcifications within the aortic arch. Blunting of left greater than right costophrenic angles with small pleural effusions, similar to prior. No pneumothorax is seen. Moderate diffuse bilateral interstitial thickening is similar to multiple prior radiographs including 04/02/2023 but slightly increased from 01/16/2023, likely reflecting mild-to-moderate interstitial pulmonary edema. No acute skeletal abnormality. There is elevation of the right clavicular head with respect to the acromion, unchanged from 04/09/2023 radiograph. IMPRESSION: 1. Unchanged Swan-Ganz catheter position. 2. Unchanged mild cardiomegaly and mild-to-moderate interstitial pulmonary edema. 3. Unchanged small bilateral pleural effusions. Electronically Signed   By: Neita Garnet M.D.   On: 04/26/2023 10:24     Scheduled Meds:  acetaminophen  1,000 mg Oral TID   apixaban  5 mg Oral BID   atorvastatin  80 mg Oral Daily   calcium-vitamin D  1 tablet Oral BID WC   Chlorhexidine Gluconate Cloth  6 each Topical Daily   darbepoetin (ARANESP) injection - DIALYSIS  150 mcg Subcutaneous Q Fri-1800   feeding supplement  237 mL Oral TID BM   Gerhardt's butt cream   Topical BID   insulin aspart  0-15 Units Subcutaneous TID WC   insulin aspart  0-5 Units Subcutaneous QHS   leptospermum manuka honey  1 Application Topical Daily   multivitamin  1 tablet Oral BID   polyethylene glycol  17 g Oral Daily   senna-docusate  2 tablet Oral BID   sodium bicarbonate  650 mg Oral BID   sodium chloride flush  3 mL Intravenous Q12H   Continuous Infusions:   prismasol BGK 4/2.5 400 mL/hr at 04/27/23 1100    prismasol BGK 4/2.5 400 mL/hr at 04/27/23  1120   sodium chloride Stopped (04/27/23 0801)   amiodarone 30 mg/hr (04/27/23 1400)   iron sucrose 20 mL/hr at 04/27/23 1400   milrinone 0.25 mcg/kg/min (04/27/23 1400)   norepinephrine (LEVOPHED) Adult infusion 8 mcg/min (04/27/23 1400)   prismasol BGK 4/2.5 1,500 mL/hr at 04/27/23 1333     LOS: 7  days    Time spent:    Marlin Canary DO [facility]   04/27/2023, 2:06 PM

## 2023-04-27 NOTE — Progress Notes (Signed)
Patient ID: [name] [name], male   DOB: [birth year], 77 y.o.   MRN: [id number]     Advanced Heart Failure Rounding Note  Cardiologist: Arvilla Meres, MD   Subjective:   Chief Complaint: dyspnea and fatigue   12/22: started on empiric milrinone 0.125 + high dose lasix, 160 IV x 2 12/23: s/p DCCV>>NSR. Milrinone increased to 0.25.  12/24: RHC 12/24 (On milrinone 0.25 mcg/kg/min) RA = 14 RV = 58/18 PA = 56/25 (40) PCW = 28 with v waves to 45 Fick cardiac output/index = 5.8/2.6 TD CO/CI = 4.7/2.1 PVR = 2.1 (Fick) 2.6 (TD) Ao sat = 97% PA sat = 46%, 48% PAPi = 2.2 Lasix gtt increased to 30  12/25: Midodrine discontinued. Started on NE  12/27: CVVH begun  Continues on Milrinone 0.25 + NE 8. Co-ox 58%. Increased O2 requirements overnight, now on 80% HHFNC. CVP 11, I/Os net negative 1930 with CVVH started yesterday, running net negative 100 cc/hr UF. Weight down.   Patient remains in NSR on amiodarone.   Afebrile, WBCs 37 => 29.5. Has not been on antibiotics.  pCXR 12/27 with pulmonary edema.   Weak, denies dyspnea.    Objective:   Weight Range: 97.3 kg Body mass index is 27.54 kg/m.   Vital Signs:   Temp:  [97.5 F (36.4 C)-98.4 F (36.9 C)] 98.3 F (36.8 C) (12/28 0350) Pulse Rate:  [74-93] 88 (12/28 0700) Resp:  [10-28] 17 (12/28 0700) BP: (82-135)/(34-99) 112/49 (12/28 0700) SpO2:  [73 %-98 %] 90 % (12/28 0700) FiO2 (%):  [80 %] 80 % (12/28 0626) Weight:  [97.3 kg] 97.3 kg (12/28 0500) Last BM Date : 04/26/23  Weight change: Filed Weights   04/25/23 0520 04/26/23 0500 04/27/23 0500  Weight: 96.8 kg 100.9 kg 97.3 kg    Intake/Output:   Intake/Output Summary (Last 24 hours) at 04/27/2023 0833 Last data filed at 04/27/2023 0700 Gross per 24 hour  Intake 1042.61 ml  Output 3010.7 ml  Net -1968.09 ml      Physical Exam    CVP 11 General: NAD Neck: JVP 10-12 cm  Lungs: Decreased at bases.  CV: Nondisplaced PMI.  Heart regular S1/S2, no S3/S4, 2/6  HSM LLSB.  1+ edema to knees.   Abdomen: Soft, nontender, no hepatosplenomegaly, no distention.  Skin: Intact without lesions or rashes.  Neurologic: Alert and oriented x 3.  Psych: Normal affect. Extremities: No clubbing or cyanosis.  HEENT: Normal.   Telemetry   NSR 70-80s Personally reviewed  Labs    CBC Recent Labs    04/26/23 0529 04/26/23 1140 04/27/23 0358  WBC 37.4*  --  29.5*  HGB 7.7* 7.4* 7.5*  HCT 22.9* 22.3* 22.5*  MCV 76.8*  --  76.8*  PLT 564*  --  542*   Basic Metabolic Panel Recent Labs    86/57/84 0529 04/26/23 1536 04/27/23 0358  NA 122* 124* 128*  K 4.7 4.5 4.3  CL 83* 87* 94*  CO2 24 26 23   GLUCOSE 130* 163* 134*  BUN 70* 58* 36*  CREATININE 5.18* 4.38* 2.81*  CALCIUM 7.7* 7.7* 7.7*  MG 2.0  --  2.1  PHOS  --  5.6* 3.4   Liver Function Tests Recent Labs    04/26/23 1536 04/27/23 0358  ALBUMIN 1.5* 1.6*    No results for input(s): "LIPASE", "AMYLASE" in the last 72 hours. Cardiac Enzymes No results for input(s): "CKTOTAL", "CKMB", "CKMBINDEX", "TROPONINI" in the last 72 hours.  BNP: BNP (last 3 results) Recent  Labs    04/20/23 0432 04/20/23 2119 04/21/23 0921  BNP 2,698.9* 2,078.7* 1,292.0*    ProBNP (last 3 results) Recent Labs    09/25/22 0953  PROBNP 2,187*     D-Dimer No results for input(s): "DDIMER" in the last 72 hours. Hemoglobin A1C No results for input(s): "HGBA1C" in the last 72 hours. Fasting Lipid Panel No results for input(s): "CHOL", "HDL", "LDLCALC", "TRIG", "CHOLHDL", "LDLDIRECT" in the last 72 hours. Thyroid Function Tests No results for input(s): "TSH", "T4TOTAL", "T3FREE", "THYROIDAB" in the last 72 hours.  Invalid input(s): "FREET3"   Other results:   Imaging    PERIPHERAL VASCULAR CATHETERIZATION Result Date: 04/26/2023 Successful HD catheter placement placement.   CARDIAC CATHETERIZATION Result Date: 04/26/2023 Successful central line placement.   DG CHEST PORT 1 VIEW Result  Date: 04/26/2023 CLINICAL DATA:  Shortness of breath. EXAM: PORTABLE CHEST 1 VIEW COMPARISON:  AP chest 04/23/2023, 04/22/2023, 04/19/2023 FINDINGS: Redemonstration of median sternotomy. Left chest wall cardiac pacer and right chest wall AICD and leads are grossly unchanged. Right internal jugular Swan-Ganz catheter with tip overlying the pulmonary outflow tract is unchanged. Cardiac silhouette is again mildly enlarged. Mediastinal contours are unchanged. Moderate to high-grade atherosclerotic calcifications within the aortic arch. Blunting of left greater than right costophrenic angles with small pleural effusions, similar to prior. No pneumothorax is seen. Moderate diffuse bilateral interstitial thickening is similar to multiple prior radiographs including 04/02/2023 but slightly increased from 01/16/2023, likely reflecting mild-to-moderate interstitial pulmonary edema. No acute skeletal abnormality. There is elevation of the right clavicular head with respect to the acromion, unchanged from 04/09/2023 radiograph. IMPRESSION: 1. Unchanged Swan-Ganz catheter position. 2. Unchanged mild cardiomegaly and mild-to-moderate interstitial pulmonary edema. 3. Unchanged small bilateral pleural effusions. Electronically Signed   By: Neita Garnet M.D.   On: 04/26/2023 10:24      Medications:     Scheduled Medications:  acetaminophen  1,000 mg Oral TID   apixaban  5 mg Oral BID   atorvastatin  80 mg Oral Daily   calcium-vitamin D  1 tablet Oral BID WC   Chlorhexidine Gluconate Cloth  6 each Topical Daily   darbepoetin (ARANESP) injection - DIALYSIS  150 mcg Subcutaneous Q Fri-1800   feeding supplement  237 mL Oral TID BM   Gerhardt's butt cream   Topical BID   insulin aspart  0-15 Units Subcutaneous TID WC   insulin aspart  0-5 Units Subcutaneous QHS   leptospermum manuka honey  1 Application Topical Daily   multivitamin  1 tablet Oral BID   polyethylene glycol  17 g Oral Daily   senna-docusate  2  tablet Oral BID   sodium bicarbonate  650 mg Oral BID   sodium chloride flush  3 mL Intravenous Q12H    Infusions:   prismasol BGK 4/2.5 400 mL/hr at 04/26/23 2236    prismasol BGK 4/2.5 400 mL/hr at 04/26/23 2237   sodium chloride 10 mL/hr at 04/27/23 0700   amiodarone 30 mg/hr (04/27/23 0700)   iron sucrose     milrinone 0.25 mcg/kg/min (04/27/23 0700)   norepinephrine (LEVOPHED) Adult infusion 8 mcg/min (04/27/23 0700)   prismasol BGK 4/2.5 1,500 mL/hr at 04/27/23 5621    PRN Medications: sodium chloride, acetaminophen, heparin, magnesium hydroxide, menthol-cetylpyridinium, methocarbamol, ondansetron (ZOFRAN) IV, mouth rinse, oxyCODONE, sodium chloride, sodium chloride, sodium chloride flush, zolpidem    Patient Profile   77 y.o. male with a PMH of COPD, CAD, CABG 2018, ischemic cardiomyopathy EF 20-25%, paroxysmal A-fib, hypertension, type 2 diabetes  mellitus, history of LV thrombus and multiple recent admissions for atrial fibrillation followed by subsequent retroperitoneal hematoma development, further c/b infection (Drainage of his hematoma with lacticaseibacillus casei, treated with a week of IV vancomycin that was stopped on 12/16). Readmitted on 12/21 for recurrent afib w/ RVR and a/c CHF w/ voulume overload and low-output, requiring inotropic support w/ milrinone.   Assessment/Plan   1. Acute on chronic systolic heart failure with biventricular involvement: Advanced disease, previously required milrinone for diuresis during his last admission.  Was discharged on midodrine without significant GDMT. Now readmitted w/ volume overload in setting of recurrent Afib w/ RVR  - Echo 11/24 EF 20-25% RV mild to moderately down - poor response to high dose Lasix gtt despite empiric milrinone and NE support - suspect severe cardiorenal syndrome - now w/ anuric renal failure, worsening fluid overload and acute hypoxic respiratory failure.  He wants to trial HD => CVVH begun on 12/27.     - Suspect end-stage CHF and cardiorenal syndrome.  Frail with malnutrition.  For now wants aggressive care but DNR/DNI in case of worsening status. Not candidate for advanced therapies.  - Co-ox 58% with stable MAP, continue milrinone 0.25 and NE 8.  - CVP 11, volume overloaded on exam with higher oxygen requirement.  Continue CVVH aiming for UF net negative 100 cc/hr.   2. Atrial fibrillation with RVR: failed recent DCCV. Back in AFib on admit, suspect triggered by worsening volume overload and recent infection. s/p DCCV 12/23, back in NSR.   - Continue IV amiodarone while on milrinone, 30 mg/hr   - Eliquis 5 mg bid   3. CAD: CABG 2018 - stable  - No chest pain.  - apixaban, statin   4. 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  - Keep CO and MAP up with milrinone + NE.  - Wants to try short-course of CVVHD to see if it can help. He understands he is not candidate for long-term HD.  Continue CVVH today, aim for net negative 100 cc/hr.    5. Diabetes: - SSI   6. Severe malnutrition - albumin 1.7 on admit   7. Iron-deficiency anemia - hgb has dropped  about 2 points over last week. No overt bleeding sites.  Hgb 7.5, transfuse < 7.  - Fe deficient, would be careful with Fe repletion with high WBCs.   8. ID - WBCs 37 => 29.5, afebrile.  CXR does not show PNA.  - Would get blood cultures.   9. DNR/DNI - discussion documented   CRITICAL CARE Performed by: Marca Ancona  Total critical care time: 40 minutes  Critical care time was exclusive of separately billable procedures and treating other patients.  Critical care was necessary to treat or prevent imminent or life-threatening deterioration.  Critical care was time spent personally by me (independent of midlevel providers or residents) on the following activities: development of treatment  plan with patient and/or surrogate as well as nursing, discussions with consultants, evaluation of patient's response to treatment, examination of patient, obtaining history from patient or surrogate, ordering and performing treatments and interventions, ordering and review of laboratory studies, ordering and review of radiographic studies, pulse oximetry and re-evaluation of patient's condition.  Marca Ancona, MD  8:33 AM

## 2023-04-27 NOTE — Progress Notes (Signed)
Subjective:  no mechanical issues with CRRT-  able to remove 1900 since starting -  no change in pressors required-  he is more alert " is it working?  "  Still with minimal UOP    Objective Vital signs in last 24 hours: Vitals:   04/27/23 0615 04/27/23 0626 04/27/23 0630 04/27/23 0645  BP: (!) 107/41 (!) 107/47 (!) 115/51   Pulse: 89 90 89 88  Resp: 19 17 14 16   Temp:      TempSrc:      SpO2: (!) 87% 94% 91% 95%  Weight:      Height:       Weight change: -3.6 kg  Intake/Output Summary (Last 24 hours) at 04/27/2023 1610 Last data filed at 04/27/2023 0700 Gross per 24 hour  Intake 1080.97 ml  Output 3010.7 ml  Net -1929.73 ml    Assessment/Plan: 77 year old WM with cardiomyopathy and instability requiring much inpatient care of late.  Most recently a step back with Afib-  medical care with milrinone and cardioversion was attempted but unfortunately is developing worsening renal function  1.Renal- as above very poor cardiac status-  unstable and in hospital a lot of late.   Most recently has had setback with hypotension and Afib which despite medical management has led to worsening renal function and worsening volume overload.  Likely due to cardiorenal syndrome. The thought is that if we could do a trial of CRRT-  bring BUN down and improve volume status it may allow for better cardiac function and also give time for renal function to recover.  This is a long shot and everyone is aware but they would like to give it a try.  Anticipate keeping the CRRT going for 5-6 days-  this is day 2.  No anticoagulation, 4 K , removing 100 ccs per hour..  Everyone is aware that patent is not a long term HD candidate  2. Hypertension/volume  - volume overloaded but also third spacing-  trying to achieve negative volume status with CRRT -  is negative 1900-  CVP seemed to come down to 8-  I would keep uf as is but understand cardiology may want to titrate 3. Hyponatremia-  due to volume overload-  should  correct with CRRT-  getting better 4. Anemia  - now hgb under 8-  is not helping third spacing-  will check iron ( is low, will replete) and give ESA- darbe 150 q week    Cecille Aver    Labs: Basic Metabolic Panel: Recent Labs  Lab 04/26/23 0529 04/26/23 1536 04/27/23 0358  NA 122* 124* 128*  K 4.7 4.5 4.3  CL 83* 87* 94*  CO2 24 26 23   GLUCOSE 130* 163* 134*  BUN 70* 58* 36*  CREATININE 5.18* 4.38* 2.81*  CALCIUM 7.7* 7.7* 7.7*  PHOS  --  5.6* 3.4   Liver Function Tests: Recent Labs  Lab 04/20/23 0847 04/26/23 1536 04/27/23 0358  AST 70*  --   --   ALT 45*  --   --   ALKPHOS 156*  --   --   BILITOT 1.3*  --   --   PROT 5.7*  --   --   ALBUMIN 2.0* 1.5* 1.6*   No results for input(s): "LIPASE", "AMYLASE" in the last 168 hours. No results for input(s): "AMMONIA" in the last 168 hours. CBC: Recent Labs  Lab 04/24/23 0505 04/24/23 9604 04/24/23 1327 04/25/23 0515 04/25/23 0605 04/26/23 0529 04/26/23 1140  04/27/23 0358  WBC 12.9*  --  15.6* 18.7*  --  37.4*  --  29.5*  HGB 8.5*   < > 8.2* 8.2*   < > 7.7* 7.4* 7.5*  HCT 26.3*   < > 25.2* 24.9*   < > 22.9* 22.3* 22.5*  MCV 79.9*  --  79.5* 78.8*  --  76.8*  --  76.8*  PLT 671*  --  657* 632*  --  564*  --  542*   < > = values in this interval not displayed.   Cardiac Enzymes: No results for input(s): "CKTOTAL", "CKMB", "CKMBINDEX", "TROPONINI" in the last 168 hours. CBG: Recent Labs  Lab 04/26/23 0617 04/26/23 1130 04/26/23 1623 04/26/23 2114 04/27/23 0634  GLUCAP 137* 144* 146* 176* 136*    Iron Studies:  Recent Labs    04/27/23 0358  IRON 5*  TIBC 171*  FERRITIN 203   Studies/Results: PERIPHERAL VASCULAR CATHETERIZATION Result Date: 04/26/2023 Successful HD catheter placement placement.   CARDIAC CATHETERIZATION Result Date: 04/26/2023 Successful central line placement.   DG CHEST PORT 1 VIEW Result Date: 04/26/2023 CLINICAL DATA:  Shortness of breath. EXAM: PORTABLE CHEST  1 VIEW COMPARISON:  AP chest 04/23/2023, 04/22/2023, 04/19/2023 FINDINGS: Redemonstration of median sternotomy. Left chest wall cardiac pacer and right chest wall AICD and leads are grossly unchanged. Right internal jugular Swan-Ganz catheter with tip overlying the pulmonary outflow tract is unchanged. Cardiac silhouette is again mildly enlarged. Mediastinal contours are unchanged. Moderate to high-grade atherosclerotic calcifications within the aortic arch. Blunting of left greater than right costophrenic angles with small pleural effusions, similar to prior. No pneumothorax is seen. Moderate diffuse bilateral interstitial thickening is similar to multiple prior radiographs including 04/02/2023 but slightly increased from 01/16/2023, likely reflecting mild-to-moderate interstitial pulmonary edema. No acute skeletal abnormality. There is elevation of the right clavicular head with respect to the acromion, unchanged from 04/09/2023 radiograph. IMPRESSION: 1. Unchanged Swan-Ganz catheter position. 2. Unchanged mild cardiomegaly and mild-to-moderate interstitial pulmonary edema. 3. Unchanged small bilateral pleural effusions. Electronically Signed   By: Neita Garnet M.D.   On: 04/26/2023 10:24   Medications: Infusions:   prismasol BGK 4/2.5 400 mL/hr at 04/26/23 2236    prismasol BGK 4/2.5 400 mL/hr at 04/26/23 2237   sodium chloride 10 mL/hr at 04/27/23 0700   amiodarone 30 mg/hr (04/27/23 0700)   milrinone 0.25 mcg/kg/min (04/27/23 0700)   norepinephrine (LEVOPHED) Adult infusion 8 mcg/min (04/27/23 0700)   prismasol BGK 4/2.5 1,500 mL/hr at 04/27/23 0865    Scheduled Medications:  acetaminophen  1,000 mg Oral TID   apixaban  5 mg Oral BID   atorvastatin  80 mg Oral Daily   calcium-vitamin D  1 tablet Oral BID WC   Chlorhexidine Gluconate Cloth  6 each Topical Daily   darbepoetin (ARANESP) injection - DIALYSIS  150 mcg Subcutaneous Q Fri-1800   feeding supplement  237 mL Oral TID BM   Gerhardt's  butt cream   Topical BID   insulin aspart  0-15 Units Subcutaneous TID WC   insulin aspart  0-5 Units Subcutaneous QHS   leptospermum manuka honey  1 Application Topical Daily   multivitamin  1 tablet Oral BID   polyethylene glycol  17 g Oral Daily   senna-docusate  2 tablet Oral BID   sodium bicarbonate  650 mg Oral BID   sodium chloride flush  3 mL Intravenous Q12H    have reviewed scheduled and prn medications.  Physical Exam: General: more alert than yesterday-  still frail Heart:RRR Lungs: CBS bilat Abdomen: distended -  non tender Extremities:pitting edema  Dialysis Access: right internal jugular vascath placed 12/27     04/27/2023,7:28 AM  LOS: 7 days

## 2023-04-28 ENCOUNTER — Inpatient Hospital Stay (HOSPITAL_COMMUNITY): Payer: Medicare Other

## 2023-04-28 LAB — CBC WITH DIFFERENTIAL/PLATELET
Abs Immature Granulocytes: 0.42 10*3/uL — ABNORMAL HIGH (ref 0.00–0.07)
Basophils Absolute: 0 10*3/uL (ref 0.0–0.1)
Basophils Relative: 0 %
Eosinophils Absolute: 0.1 10*3/uL (ref 0.0–0.5)
Eosinophils Relative: 0 %
HCT: 22.2 % — ABNORMAL LOW (ref 39.0–52.0)
Hemoglobin: 7.3 g/dL — ABNORMAL LOW (ref 13.0–17.0)
Immature Granulocytes: 2 %
Lymphocytes Relative: 3 %
Lymphs Abs: 0.9 10*3/uL (ref 0.7–4.0)
MCH: 25.4 pg — ABNORMAL LOW (ref 26.0–34.0)
MCHC: 32.9 g/dL (ref 30.0–36.0)
MCV: 77.4 fL — ABNORMAL LOW (ref 80.0–100.0)
Monocytes Absolute: 1.1 10*3/uL — ABNORMAL HIGH (ref 0.1–1.0)
Monocytes Relative: 4 %
Neutro Abs: 25.4 10*3/uL — ABNORMAL HIGH (ref 1.7–7.7)
Neutrophils Relative %: 91 %
Platelets: 480 10*3/uL — ABNORMAL HIGH (ref 150–400)
RBC: 2.87 MIL/uL — ABNORMAL LOW (ref 4.22–5.81)
RDW: 21.2 % — ABNORMAL HIGH (ref 11.5–15.5)
Smear Review: ADEQUATE
WBC: 27.9 10*3/uL — ABNORMAL HIGH (ref 4.0–10.5)
nRBC: 0.1 % (ref 0.0–0.2)

## 2023-04-28 LAB — RENAL FUNCTION PANEL
Albumin: 1.5 g/dL — ABNORMAL LOW (ref 3.5–5.0)
Albumin: <1.5 g/dL — ABNORMAL LOW (ref 3.5–5.0)
Anion gap: 9 (ref 5–15)
Anion gap: 9 (ref 5–15)
BUN: 18 mg/dL (ref 8–23)
BUN: 14 mg/dL (ref 8–23)
CO2: 24 mmol/L (ref 22–32)
CO2: 24 mmol/L (ref 22–32)
Calcium: 7.6 mg/dL — ABNORMAL LOW (ref 8.9–10.3)
Calcium: 7.7 mg/dL — ABNORMAL LOW (ref 8.9–10.3)
Chloride: 98 mmol/L (ref 98–111)
Chloride: 98 mmol/L (ref 98–111)
Creatinine, Ser: 1.73 mg/dL — ABNORMAL HIGH (ref 0.61–1.24)
Creatinine, Ser: 1.46 mg/dL — ABNORMAL HIGH (ref 0.61–1.24)
GFR, Estimated: 40 mL/min — ABNORMAL LOW (ref >60)
GFR, Estimated: 49 mL/min — ABNORMAL LOW (ref >60)
Glucose, Bld: 151 mg/dL — ABNORMAL HIGH (ref 70–99)
Glucose, Bld: 57 mg/dL — ABNORMAL LOW (ref 70–99)
Phosphorus: 2.9 mg/dL (ref 2.5–4.6)
Phosphorus: 1.4 mg/dL — ABNORMAL LOW (ref 2.5–4.6)
Potassium: 4.1 mmol/L (ref 3.5–5.1)
Potassium: 4 mmol/L (ref 3.5–5.1)
Sodium: 131 mmol/L — ABNORMAL LOW (ref 135–145)
Sodium: 131 mmol/L — ABNORMAL LOW (ref 135–145)

## 2023-04-28 LAB — GLUCOSE, CAPILLARY
Glucose-Capillary: 144 mg/dL — ABNORMAL HIGH (ref 70–99)
Glucose-Capillary: 136 mg/dL — ABNORMAL HIGH (ref 70–99)
Glucose-Capillary: 76 mg/dL (ref 70–99)
Glucose-Capillary: 86 mg/dL (ref 70–99)

## 2023-04-28 LAB — COOXEMETRY PANEL
Carboxyhemoglobin: 1.3 % (ref 0.5–1.5)
Methemoglobin: <0.7 % (ref 0.0–1.5)
O2 Saturation: 62.7 %
Total hemoglobin: 7.6 g/dL — ABNORMAL LOW (ref 12.0–16.0)

## 2023-04-28 LAB — MAGNESIUM: Magnesium: 2.2 mg/dL (ref 1.7–2.4)

## 2023-04-28 LAB — PROCALCITONIN: Procalcitonin: 3.26 ng/mL

## 2023-04-28 MED ORDER — SODIUM PHOSPHATES 45 MMOLE/15ML IV SOLN
30.0000 mmol | Freq: Once | INTRAVENOUS | Status: AC
  Administered 2023-04-28: 30 mmol via INTRAVENOUS
  Filled 2023-04-28: qty 10

## 2023-04-28 MED ORDER — SODIUM CHLORIDE 0.9 % IV SOLN: INTRAVENOUS | Status: AC | PRN

## 2023-04-28 MED ORDER — PIPERACILLIN-TAZOBACTAM 3.375 G IVPB 30 MIN
3.3750 g | Freq: Four times a day (QID) | INTRAVENOUS | Status: DC
  Administered 2023-04-28 – 2023-04-29 (×6): 3.375 g via INTRAVENOUS
  Filled 2023-04-28 (×6): qty 50

## 2023-04-28 MED ORDER — HYDROCORTISONE 1 % EX CREA
TOPICAL_CREAM | Freq: Three times a day (TID) | CUTANEOUS | Status: DC
Start: 2023-04-28 — End: 2023-04-28
  Filled 2023-04-28: qty 28

## 2023-04-28 MED ORDER — HYDROCORTISONE 1 % EX CREA: TOPICAL_CREAM | Freq: Three times a day (TID) | CUTANEOUS | Status: DC | PRN

## 2023-04-28 MED ORDER — ALPRAZOLAM 0.25 MG PO TABS
0.2500 mg | ORAL_TABLET | Freq: Three times a day (TID) | ORAL | Status: DC | PRN
  Administered 2023-04-28: 0.25 mg via ORAL
  Filled 2023-04-28 (×2): qty 1

## 2023-04-28 NOTE — Progress Notes (Signed)
Patient ID: [name] [name], male   DOB: July 16, 1945, 77 y.o.   MRN: [id number]     Advanced Heart Failure Rounding Note  Cardiologist: Arvilla Meres, MD   Subjective:   Chief Complaint: dyspnea and fatigue   12/22: started on empiric milrinone 0.125 + high dose lasix, 160 IV x 2 12/23: s/p DCCV>>NSR. Milrinone increased to 0.25.  12/24: RHC 12/24 (On milrinone 0.25 mcg/kg/min) RA = 14 RV = 58/18 PA = 56/25 (40) PCW = 28 with v waves to 45 Fick cardiac output/index = 5.8/2.6 TD CO/CI = 4.7/2.1 PVR = 2.1 (Fick) 2.6 (TD) Ao sat = 97% PA sat = 46%, 48% PAPi = 2.2 Lasix gtt increased to 30  12/25: Midodrine discontinued. Started on NE  12/27: CVVH begun  Continues on Milrinone 0.25 + NE 8. Co-ox 63%. Increased O2 requirements again overnight, now on 100% HHFNC. CVP 6-7, I/Os net negative 2389 with CVVH, running net negative 100 cc/hr UF. Weight down.   Patient remains in NSR on amiodarone.   Afebrile, WBCs 37 => 29.5 => 28. Has not been on antibiotics.  pCXR 12/27 with pulmonary edema.   Weak, dyspneic/tachypneic.     Objective:   Weight Range: 96.6 kg Body mass index is 27.34 kg/m.   Vital Signs:   Temp:  [97.7 F (36.5 C)-98.7 F (37.1 C)] 97.8 F (36.6 C) (12/29 0400) Pulse Rate:  [75-89] 88 (12/29 0752) Resp:  [9-30] 30 (12/29 0752) BP: (78-124)/(33-94) 110/47 (12/29 0700) SpO2:  [86 %-96 %] 91 % (12/29 0752) FiO2 (%):  [80 %-100 %] 100 % (12/29 0752) Weight:  [96.6 kg] 96.6 kg (12/29 0500) Last BM Date : 04/27/23  Weight change: Filed Weights   04/26/23 0500 04/27/23 0500 04/28/23 0500  Weight: 100.9 kg 97.3 kg 96.6 kg    Intake/Output:   Intake/Output Summary (Last 24 hours) at 04/28/2023 0823 Last data filed at 04/28/2023 0800 Gross per 24 hour  Intake 1639.94 ml  Output 4008 ml  Net -2368.06 ml      Physical Exam    CVP 6-7 General: Weak, dyspneic Neck: No JVD, no thyromegaly or thyroid nodule.  Lungs: Crackles at bases. CV:  Nondisplaced PMI.  Heart regular S1/S2, no S3/S4, no murmur.  1+ edema to knees.  Abdomen: Soft, nontender, no hepatosplenomegaly, no distention.  Skin: Intact without lesions or rashes.  Neurologic: Alert and oriented x 3.  Psych: Normal affect. Extremities: No clubbing or cyanosis.  HEENT: Normal.    Telemetry   NSR 70-80s Personally reviewed  Labs    CBC Recent Labs    04/27/23 0358 04/28/23 0325  WBC 29.5* 27.9*  NEUTROABS  --  25.4*  HGB 7.5* 7.3*  HCT 22.5* 22.2*  MCV 76.8* 77.4*  PLT 542* 480*   Basic Metabolic Panel Recent Labs    40/98/11 0358 04/27/23 1742 04/28/23 0325  NA 128* 129* 131*  K 4.3 4.2 4.1  CL 94* 95* 98  CO2 23 24 24   GLUCOSE 134* 172* 151*  BUN 36* 25* 18  CREATININE 2.81* 2.04* 1.73*  CALCIUM 7.7* 7.7* 7.6*  MG 2.1  --  2.2  PHOS 3.4 2.2* 2.9   Liver Function Tests Recent Labs    04/27/23 1742 04/28/23 0325  ALBUMIN 1.5* 1.5*    No results for input(s): "LIPASE", "AMYLASE" in the last 72 hours. Cardiac Enzymes No results for input(s): "CKTOTAL", "CKMB", "CKMBINDEX", "TROPONINI" in the last 72 hours.  BNP: BNP (last 3 results) Recent Labs  04/20/23 0432 04/20/23 2119 04/21/23 0921  BNP 2,698.9* 2,078.7* 1,292.0*    ProBNP (last 3 results) Recent Labs    09/25/22 0953  PROBNP 2,187*     D-Dimer No results for input(s): "DDIMER" in the last 72 hours. Hemoglobin A1C No results for input(s): "HGBA1C" in the last 72 hours. Fasting Lipid Panel No results for input(s): "CHOL", "HDL", "LDLCALC", "TRIG", "CHOLHDL", "LDLDIRECT" in the last 72 hours. Thyroid Function Tests No results for input(s): "TSH", "T4TOTAL", "T3FREE", "THYROIDAB" in the last 72 hours.  Invalid input(s): "FREET3"   Other results:   Imaging    No results found.     Medications:     Scheduled Medications:  acetaminophen  1,000 mg Oral TID   apixaban  5 mg Oral BID   atorvastatin  80 mg Oral Daily   calcium-vitamin D  1 tablet  Oral BID WC   Chlorhexidine Gluconate Cloth  6 each Topical Daily   darbepoetin (ARANESP) injection - DIALYSIS  150 mcg Subcutaneous Q Fri-1800   feeding supplement  237 mL Oral TID BM   Gerhardt's butt cream   Topical BID   insulin aspart  0-15 Units Subcutaneous TID WC   insulin aspart  0-5 Units Subcutaneous QHS   leptospermum manuka honey  1 Application Topical Daily   multivitamin  1 tablet Oral BID   polyethylene glycol  17 g Oral Daily   senna-docusate  2 tablet Oral BID   sodium bicarbonate  650 mg Oral BID   sodium chloride flush  3 mL Intravenous Q12H    Infusions:   prismasol BGK 4/2.5 400 mL/hr at 04/27/23 2341    prismasol BGK 4/2.5 400 mL/hr at 04/27/23 2338   sodium chloride Stopped (04/27/23 0801)   amiodarone 30 mg/hr (04/28/23 0800)   iron sucrose 20 mL/hr at 04/27/23 2000   milrinone 0.25 mcg/kg/min (04/28/23 0800)   norepinephrine (LEVOPHED) Adult infusion 8 mcg/min (04/28/23 0800)   prismasol BGK 4/2.5 1,500 mL/hr at 04/28/23 0610    PRN Medications: sodium chloride, acetaminophen, heparin, magnesium hydroxide, menthol-cetylpyridinium, methocarbamol, ondansetron (ZOFRAN) IV, mouth rinse, oxyCODONE, saline, sodium chloride, sodium chloride, sodium chloride flush, zolpidem    Patient Profile   78 y.o. 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 Readmitted on 12/21 for recurrent afib w/ RVR and a/c CHF w/ voulume overload and low-output, requiring inotropic support w/ milrinone.   Assessment/Plan   1. Acute on chronic systolic heart failure with biventricular involvement: Advanced disease,  previously required milrinone for diuresis during his last admission.  Was discharged on midodrine without significant GDMT. Now readmitted w/ volume overload in setting of recurrent Afib w/ RVR  - Echo 11/24 EF 20-25% RV mild to moderately down - poor response to high dose Lasix gtt despite empiric milrinone and NE support - suspect severe cardiorenal syndrome - now w/ anuric renal failure, worsening fluid overload and acute hypoxic respiratory failure.  He wants to trial HD => CVVH begun on 12/27.    - Suspect end-stage CHF and cardiorenal syndrome.  Frail with malnutrition.  For now wants aggressive care but DNR/DNI in case of worsening status. Not candidate for advanced therapies.  - Co-ox 63% with stable MAP, continue  milrinone 0.25 and NE 8.  - CVP 6-7 but more short of breath with higher oxygen requirement, ?aspiration or PNA.  For now, will continue CVVH net UF 100 cc/hr.   2. Atrial fibrillation with RVR: failed recent DCCV. Back in AFib on admit, suspect triggered by worsening volume overload and recent infection. s/p DCCV 12/23, back in NSR.   - Continue IV amiodarone while on milrinone, 30 mg/hr   - Eliquis 5 mg bid   3. CAD: CABG 2018 - stable  - No chest pain.  - apixaban, statin   4. 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  - Keep CO and MAP up with milrinone + NE.  - Wants to try short-course of CVVHD to see if it can help. He understands he is not candidate for long-term HD.  Continue CVVH today, aim for net negative 100 cc/hr. Plan for 5-6 days CVVH.    5. Diabetes: - SSI   6. Severe malnutrition - albumin 1.7 on admit   7. Iron-deficiency anemia - hgb has dropped  about 2 points over last week. No overt bleeding sites.  Hgb 7.5, transfuse < 7.  - Fe deficient, would be careful with Fe repletion with high WBCs.   8. ID - WBCs 37  => 29.5 => 28, afebrile. Worsening oxygenation despite aggressive CVVH and lowering of CVP.  ?PNA.   - Cultures NGTD. - Will start empiric Zosyn (MRSA PCR negative so no vancomycin).  9. Pulmonary - Worsening respiratory status despite improvement in volume status.  Now on 100% HHFNC.  He is DNR/DNI.  ?PNA => CXR today, empiric HCAP coverage, will place on Bipap.    9. DNR/DNI - Poor prognosis, he is worsening.  Will consult palliative care.    CRITICAL CARE Performed by: Marca Ancona  Total critical care time: 45 minutes  Critical care time was exclusive of separately billable procedures and treating other patients.  Critical care was necessary to treat or prevent imminent or life-threatening deterioration.  Critical care was time spent personally by me (independent of midlevel providers or residents) on the following activities: development of treatment plan with patient and/or surrogate as well as nursing, discussions with consultants, evaluation of patient's response to treatment, examination of patient, obtaining history from patient or surrogate, ordering and performing treatments and interventions, ordering and review of laboratory studies, ordering and review of radiographic studies, pulse oximetry and re-evaluation of patient's condition.  Marca Ancona, MD  8:23 AM

## 2023-04-28 NOTE — Progress Notes (Signed)
Pt placed on Servo NIV w/ medium FFM.pt on 5/5 @ 60%. Pt states he is unable to tolerate. Placed back on HHFNC

## 2023-04-28 NOTE — Progress Notes (Signed)
Subjective:  no mechanical issues with CRRT-  able to remove 2200 last 24 hours-   4100 since starting - CVP at lowest 7-   no change in pressors required-   Still with minimal UOP " I've been better"  trying to eat some breakfast this AM   Objective Vital signs in last 24 hours: Vitals:   04/28/23 0615 04/28/23 0630 04/28/23 0645 04/28/23 0700  BP: (!) 102/47 (!) 107/44 (!) 114/45 (!) 110/47  Pulse: 88 89 83 89  Resp: (!) 25 (!) 23 20 18   Temp:      TempSrc:      SpO2: (!) 89% 90% (!) 78% (!) 89%  Weight:      Height:       Weight change: -0.7 kg  Intake/Output Summary (Last 24 hours) at 04/28/2023 0720 Last data filed at 04/28/2023 0700 Gross per 24 hour  Intake 1649.94 ml  Output 3864.5 ml  Net -2214.56 ml    Assessment/Plan: 77 year old WM with cardiomyopathy and instability requiring much inpatient care of late.  Most recently a step back with Afib-  medical care with milrinone and cardioversion was attempted but unfortunately is developing worsening renal function  1.Renal- as above very poor cardiac status-  unstable and in hospital a lot of late.   Most recently has had setback with hypotension and Afib which despite medical management has led to worsening renal function and worsening volume overload.  Likely due to cardiorenal syndrome. The thought is that if we could do a trial of CRRT-  bring BUN down and improve volume status it may allow for better cardiac function and also give time for renal function to recover.  This is a long shot and everyone is aware but they would like to give it a try.  Anticipate keeping the CRRT going for 5-6 days-  this is day 3.  No anticoagulation, 4 K , removing 100 ccs per hour..  Everyone is aware that patent is not a long term HD candidate -  no changes in orders today  2. Hypertension/volume  - volume overloaded but also third spacing-  trying to achieve negative volume status with CRRT -  is negative 1900-  CVP seemed to come down to 7-  I  would keep uf as is but understand cardiology may want to titrate 3. Hyponatremia-  due to volume overload-  should correct with CRRT-  getting better 4. Anemia  - now hgb under 8-  is not helping third spacing-  will check iron ( is low, will replete) and give ESA- darbe 150 q week  5. [place]-  repleted phos 12/27   Vashti Bolanos A Cyril Woodmansee    Labs: Basic Metabolic Panel: Recent Labs  Lab 04/27/23 0358 04/27/23 1742 04/28/23 0325  NA 128* 129* 131*  K 4.3 4.2 4.1  CL 94* 95* 98  CO2 23 24 24   GLUCOSE 134* 172* 151*  BUN 36* 25* 18  CREATININE 2.81* 2.04* 1.73*  CALCIUM 7.7* 7.7* 7.6*  PHOS 3.4 2.2* 2.9   Liver Function Tests: Recent Labs  Lab 04/27/23 0358 04/27/23 1742 04/28/23 0325  ALBUMIN 1.6* 1.5* 1.5*   No results for input(s): "LIPASE", "AMYLASE" in the last 168 hours. No results for input(s): "AMMONIA" in the last 168 hours. CBC: Recent Labs  Lab 04/24/23 1327 04/25/23 0515 04/25/23 0605 04/26/23 0529 04/26/23 1140 04/27/23 0358 04/28/23 0325  WBC 15.6* 18.7*  --  37.4*  --  29.5* 27.9*  NEUTROABS  --   --   --   --   --   --  25.4*  HGB 8.2* 8.2*   < > 7.7* 7.4* 7.5* 7.3*  HCT 25.2* 24.9*   < > 22.9* 22.3* 22.5* 22.2*  MCV 79.5* 78.8*  --  76.8*  --  76.8* 77.4*  PLT 657* 632*  --  564*  --  542* 480*   < > = values in this interval not displayed.   Cardiac Enzymes: No results for input(s): "CKTOTAL", "CKMB", "CKMBINDEX", "TROPONINI" in the last 168 hours. CBG: Recent Labs  Lab 04/27/23 0634 04/27/23 1226 04/27/23 1727 04/27/23 2111 04/28/23 0702  GLUCAP 136* 167* 171* 151* 144*    Iron Studies:  Recent Labs    04/27/23 0358  IRON 5*  TIBC 171*  FERRITIN 203   Studies/Results: PERIPHERAL VASCULAR CATHETERIZATION Result Date: 04/26/2023 Successful HD catheter placement placement.   CARDIAC CATHETERIZATION Result Date: 04/26/2023 Successful central line placement.   DG CHEST PORT 1 VIEW Result Date: 04/26/2023 CLINICAL DATA:   Shortness of breath. EXAM: PORTABLE CHEST 1 VIEW COMPARISON:  AP chest 04/23/2023, 04/22/2023, 04/19/2023 FINDINGS: Redemonstration of median sternotomy. Left chest wall cardiac pacer and right chest wall AICD and leads are grossly unchanged. Right internal jugular Swan-Ganz catheter with tip overlying the pulmonary outflow tract is unchanged. Cardiac silhouette is again mildly enlarged. Mediastinal contours are unchanged. Moderate to high-grade atherosclerotic calcifications within the aortic arch. Blunting of left greater than right costophrenic angles with small pleural effusions, similar to prior. No pneumothorax is seen. Moderate diffuse bilateral interstitial thickening is similar to multiple prior radiographs including 04/02/2023 but slightly increased from 01/16/2023, likely reflecting mild-to-moderate interstitial pulmonary edema. No acute skeletal abnormality. There is elevation of the right clavicular head with respect to the acromion, unchanged from 04/09/2023 radiograph. IMPRESSION: 1. Unchanged Swan-Ganz catheter position. 2. Unchanged mild cardiomegaly and mild-to-moderate interstitial pulmonary edema. 3. Unchanged small bilateral pleural effusions. Electronically Signed   By: Neita Garnet M.D.   On: 04/26/2023 10:24   Medications: Infusions:   prismasol BGK 4/2.5 400 mL/hr at 04/27/23 2341    prismasol BGK 4/2.5 400 mL/hr at 04/27/23 2338   sodium chloride Stopped (04/27/23 0801)   amiodarone 30 mg/hr (04/28/23 0700)   iron sucrose 20 mL/hr at 04/27/23 2000   milrinone 0.25 mcg/kg/min (04/28/23 0700)   norepinephrine (LEVOPHED) Adult infusion 8 mcg/min (04/28/23 0700)   prismasol BGK 4/2.5 1,500 mL/hr at 04/28/23 0610    Scheduled Medications:  acetaminophen  1,000 mg Oral TID   apixaban  5 mg Oral BID   atorvastatin  80 mg Oral Daily   calcium-vitamin D  1 tablet Oral BID WC   Chlorhexidine Gluconate Cloth  6 each Topical Daily   darbepoetin (ARANESP) injection - DIALYSIS  150  mcg Subcutaneous Q Fri-1800   feeding supplement  237 mL Oral TID BM   Gerhardt's butt cream   Topical BID   insulin aspart  0-15 Units Subcutaneous TID WC   insulin aspart  0-5 Units Subcutaneous QHS   leptospermum manuka honey  1 Application Topical Daily   multivitamin  1 tablet Oral BID   polyethylene glycol  17 g Oral Daily   senna-docusate  2 tablet Oral BID   sodium bicarbonate  650 mg Oral BID   sodium chloride flush  3 mL Intravenous Q12H    have reviewed scheduled and prn medications.  Physical Exam: General: alert -  still frail Heart:RRR Lungs: CBS bilat Abdomen: distended -  non tender Extremities:pitting edema  Dialysis Access: right internal jugular vascath placed 12/27  04/28/2023,7:20 AM  LOS: 8 days

## 2023-04-28 NOTE — Progress Notes (Signed)
PROGRESS NOTE  [name] [name] NWG:[id number] DOB: [birth year] DOA: 04/20/2023 PCP: [name], MD  Brief History   77 y.o. male with COPD, CAD, CABG 2018, ischemic cardiomyopathy EF 20-25%, paroxysmal A-fib, hypertension, type 2 diabetes mellitus, history of LV thrombus.   -Admitted 11/4 w/syncope fall, Afib>cardioverted 11/7, discharged 11/8 -Readmitted 11/25 with increased right hip pain, imaging noted retroperitoneal hematoma extending to distal psoas and iliacus and also volume overloaded, diuresed, followed by nephrology and cards then, started on midodrine, discharged to rehab 12/9 on torsemide -In rehab he had worsening leukocytosis up to 43K, infectious disease consulted 12/10, on 12/12 underwent ultrasound-guided aspiration of right hip, 30 cc of turbid brownish fluid drained, Cx w LACTICASEIBACILLUS CASEI, he was treated with few days of IV vancomycin which does not have efficacy against this, leukocytosis largely spontaneously started improving, vancomycin discontinued 12/16.  Last 2 days had increased shortness of breath, elevated heart rate etc.  12/21: Readmitted from CIR back to inpatient, 2H with A-fib RVR and CHF 12/21, heart failure team consulted, started on milrinone, Lasix dose increased 12/23 cardioverted to sinus rhythm 12/24: Poor response to diuretics 12/27- CVVD started 12/29 -Oxygen requirements have increased overnight.  Pt with respiratory distress despite CVVH and HHF O2 at 40 L 100%. NIV  attempted, but patient did not tolerate it well. He was placed back on HHF. The patient is currently on NE at 8 and milrinone at 0.25. Pt remains on CVVH dur to ongoing volume overload and pulmonary edema.  A & P  Acute on chronic systolic CHF, ischemic cardiomyopathy Low output state -CHF team managing.  Pt on milrinone at 0.25 and NE at 8. 12/29 is day 7 of milrinone. -CVVD starting 12/27. Plan is to continue for 3-5 more days.   Atrial fibrillation RVR -On amiodarone,  prior cardioversions -Continue IV amiodarone -Per cardiology, sp DCCV 12/23 -Anticoagulation resumed -on apixaban  Acute hypoxic respiratory failure: Increasing oxygen requirements overnight. NIV was attempted this morning, but the patient was unable to tolerate it. He is now again on 40L HHF at 100% FIO2. He is DNI.   Acute on chronic systolic CHF, ischemic cardiomyopathy Low output state -CHF team managing, prognosis appears to be poor -CVVD starting 12/27. Plan is to continue for 3-5 more days.   Atrial fibrillation RVR -On amiodarone, prior cardioversions -Continue IV amiodarone -Per cardiology, sp DCCV 12/23 -Anticoagulation resumed -on apixaban -Heart rate is controlled on current regimen   Retroperitoneal hematoma extending to distal psoas and iliacus -Following mechanical fall -Had profound leukocytosis around 40K in CIR,12/12 underwent ultrasound-guided aspiration of right hip, 30 cc of turbid brownish fluid drained, Cx w LACTICASEIBACILLUS CASEI, he was treated with few days of IV vancomycin which has no activity against this, leukocytosis largely spontaneously started improving, vancomycin discontinued 12/16, ID signed off -Mild leukocytosis and thrombocytosis persists, afebrile and nontoxic, no abdominal symptoms, monitor -consider repeating CT scan of abdomen if WBC increased further, afebrile so reassuring -Hemoglobin is slowly drifting downwards. 7.3 on the morning of 04/28/2023. Monitor and transfuse for hemoglobin less than 7.0.   Type 2 diabetes mellitus   Leukocytosis -WBC 12/27 was 37.4; 12/28 - 29.5; 12/29 27.9 Continue to monitor.    CAD  Hx CABG 2018 -Stable   AKI, CKD 3B/4 -Baseline creatinine around 2-2.2 Creatinine is 1.73 today. -on CVVD   Chronic anemia -Hemoglobin is slowly drifting downwards. 7.3 on the morning of 04/28/2023. Monitor and transfuse for hemoglobin less than 7.0.   Type 2 diabetes mellitus -  Recent A1c was 6.9 -On Ozempic at  baseline, off metformin now -CBGs are stable, continue SSI,   Severe hypoalbuminemia -No clear history of cirrhosis -?  Malnutrition alone versus combination with chronic liver disease -Dietitian consult -Check prealbumin   GOALS: 77/M with severe cardiomyopathy, A-fib RVR, severe hypoalbuminemia and third spacing, retroperitoneal hematoma, poor response to milrinone and high-dose Lasix, prognosis is poor-- GOC discussion per CHF team   DVT prophylaxis: Apixaban Code Status: DNR Disposition Plan: Remain in ICU  I have seen and examined this patient myself. I have spent 34 minutes in his evaluation and care.  DVT prophylaxis: Eliquis Code Status: DNR - Limited Family Communication: Family at bedside Disposition Plan: tbd    Antoni Stefan, DO [facility] Direct contact: see [email or web address]  7PM-7AM contact night coverage as above 04/28/2023, 2:08 PM  LOS: 8 days   Consultants  Nephrology Advanced Heart failure  Procedures  None  Antibiotics   Anti-infectives (From admission, onward)    Start     Dose/Rate Route Frequency Ordered Stop   04/28/23 0830  piperacillin-tazobactam (ZOSYN) IVPB 3.375 g        3.375 g 100 mL/hr over 30 Minutes Intravenous Every 6 hours 04/28/23 0826         Interval History/Subjective  The patient is lying quietly. He is sleeping as he was recently medicated with ativan.  Objective   Vitals:  Vitals:   04/28/23 1245 04/28/23 1300  BP: (!) 93/45 (!) 105/38  Pulse: 87 88  Resp: (!) 22 (!) 21  Temp:    SpO2: (!) 88% (!) 89%    Exam:  Constitutional:  The patient has recently received ativan for anxiety. He is sleeping. No acute distress. Respiratory:  No increased work of breathing. Positive for rales at bases. No wheezes or rhonchi. No tactile fremitus Cardiovascular:  Regular rate and rhythm No murmurs, ectopy, or gallups. No lateral PMI. No thrills. Abdomen:  Abdomen is soft, non-tender, non-distended No hernias,  masses, or organomegaly Normoactive bowel sounds.  Musculoskeletal:  No cyanosis, clubbing, or edema Skin:  No rashes, lesions, ulcers palpation of skin: no induration or nodules Neurologic:  CN 2-12 intact Sensation all 4 extremities intact Psychiatric:  Mental status Mood, affect appropriate Orientation to person, place, time  judgment and insight appear intact  I have personally reviewed the following:   Today's Data  Vitals  Lab Data  CBC    Component Value Date/Time   WBC 27.9 (H) 04/28/2023 0325   RBC 2.87 (L) 04/28/2023 0325   HGB 7.3 (L) 04/28/2023 0325   HGB 13.2 11/06/2022 1510   HCT 22.2 (L) 04/28/2023 0325   HCT 40.9 11/06/2022 1510   PLT 480 (H) 04/28/2023 0325   PLT 288 11/06/2022 1510   MCV 77.4 (L) 04/28/2023 0325   MCV 91 11/06/2022 1510   MCH 25.4 (L) 04/28/2023 0325   MCHC 32.9 04/28/2023 0325   RDW 21.2 (H) 04/28/2023 0325   RDW 14.5 11/06/2022 1510   LYMPHSABS 0.9 04/28/2023 0325   LYMPHSABS 2.0 11/14/2017 1025   MONOABS 1.1 (H) 04/28/2023 0325   EOSABS 0.1 04/28/2023 0325   EOSABS 0.2 11/14/2017 1025   BASOSABS 0.0 04/28/2023 0325   BASOSABS 0.0 11/14/2017 1025      Latest Ref Rng & Units 04/28/2023    3:25 AM 04/27/2023    5:42 PM 04/27/2023    3:58 AM  BMP  Glucose 70 - 99 mg/dL 884  166  063   BUN 8 -  23 mg/dL 18  25  36   Creatinine 0.61 - 1.24 mg/dL 1.61  0.96  0.45   Sodium 135 - 145 mmol/L 131  129  128   Potassium 3.5 - 5.1 mmol/L 4.1  4.2  4.3   Chloride 98 - 111 mmol/L 98  95  94   CO2 22 - 32 mmol/L 24  24  23    Calcium 8.9 - 10.3 mg/dL 7.6  7.7  7.7      Micro Data   Results for orders placed or performed during the hospital encounter of 04/20/23  MRSA Next Gen by PCR, Nasal     Status: None   Collection Time: 04/20/23 10:29 AM   Specimen: Nasal Mucosa; Nasal Swab  Result Value Ref Range Status   MRSA by PCR Next Gen NOT DETECTED NOT DETECTED Final    Comment: (NOTE) The GeneXpert MRSA Assay (FDA approved for  NASAL specimens only), is one component of a comprehensive MRSA colonization surveillance program. It is not intended to diagnose MRSA infection nor to guide or monitor treatment for MRSA infections. Test performance is not FDA approved in patients less than 85 years old. Performed at [facility], [address], [place], [place] [address]   Culture, blood (Routine X 2) w Reflex to ID Panel     Status: None (Preliminary result)   Collection Time: 04/27/23  9:27 AM   Specimen: BLOOD RIGHT ARM  Result Value Ref Range Status   Specimen Description BLOOD RIGHT ARM  Final   Special Requests   Final    BOTTLES DRAWN AEROBIC AND ANAEROBIC Blood Culture results may not be optimal due to an inadequate volume of blood received in culture bottles   Culture   Final    NO GROWTH < 24 HOURS Performed at [facility], [address], [place], [place] [address]    Report Status PENDING  Incomplete  Culture, blood (Routine X 2) w Reflex to ID Panel     Status: None (Preliminary result)   Collection Time: 04/27/23  9:29 AM   Specimen: BLOOD LEFT ARM  Result Value Ref Range Status   Specimen Description BLOOD LEFT ARM  Final   Special Requests   Final    BOTTLES DRAWN AEROBIC AND ANAEROBIC Blood Culture results may not be optimal due to an inadequate volume of blood received in culture bottles   Culture   Final    NO GROWTH < 24 HOURS Performed at [facility], [address], [place], [place] [address]    Report Status PENDING  Incomplete     Imaging  CXR 12/29: bilateral pulmonary edema with bilateral pleural effusions.  Cardiology Data  EKG Echo  Scheduled Meds:  acetaminophen  1,000 mg Oral TID   apixaban  5 mg Oral BID   atorvastatin  80 mg Oral Daily   calcium-vitamin D  1 tablet Oral BID WC   Chlorhexidine Gluconate Cloth  6 each Topical Daily   darbepoetin (ARANESP) injection - DIALYSIS  150 mcg Subcutaneous Q Fri-1800   feeding supplement  237 mL Oral TID BM    Gerhardt's butt cream   Topical BID   insulin aspart  0-15 Units Subcutaneous TID WC   insulin aspart  0-5 Units Subcutaneous QHS   leptospermum manuka honey  1 Application Topical Daily   multivitamin  1 tablet Oral BID   polyethylene glycol  17 g Oral Daily   senna-docusate  2 tablet Oral  BID   sodium bicarbonate  650 mg Oral BID   sodium chloride flush  3 mL Intravenous Q12H   Continuous Infusions:   prismasol BGK 4/2.5 400 mL/hr at 04/28/23 1125    prismasol BGK 4/2.5 400 mL/hr at 04/28/23 1120   sodium chloride Stopped (04/27/23 0801)   sodium chloride Stopped (04/28/23 1216)   amiodarone 30 mg/hr (04/28/23 1300)   iron sucrose Stopped (04/28/23 1149)   milrinone 0.25 mcg/kg/min (04/28/23 1300)   norepinephrine (LEVOPHED) Adult infusion 11 mcg/min (04/28/23 1300)   piperacillin-tazobactam Stopped (04/28/23 0930)   prismasol BGK 4/2.5 1,500 mL/hr at 04/28/23 1305    Principal Problem:   Cardiogenic shock (HCC) Active Problems:   Acute on chronic systolic heart failure (HCC)   LOS: 8 days

## 2023-04-29 ENCOUNTER — Encounter (HOSPITAL_COMMUNITY): Payer: Self-pay | Admitting: Internal Medicine

## 2023-04-29 DIAGNOSIS — Z515 Encounter for palliative care: Secondary | ICD-10-CM

## 2023-04-29 DIAGNOSIS — Z66 Do not resuscitate: Secondary | ICD-10-CM

## 2023-04-29 DIAGNOSIS — Z7189 Other specified counseling: Secondary | ICD-10-CM

## 2023-04-29 LAB — CBC WITH DIFFERENTIAL/PLATELET
Abs Immature Granulocytes: 0.55 10*3/uL — ABNORMAL HIGH (ref 0.00–0.07)
Basophils Absolute: 0.1 10*3/uL (ref 0.0–0.1)
Basophils Relative: 0 %
Eosinophils Absolute: 0 10*3/uL (ref 0.0–0.5)
Eosinophils Relative: 0 %
HCT: 21.8 % — ABNORMAL LOW (ref 39.0–52.0)
Hemoglobin: 7.2 g/dL — ABNORMAL LOW (ref 13.0–17.0)
Immature Granulocytes: 1 %
Lymphocytes Relative: 3 %
Lymphs Abs: 1.2 10*3/uL (ref 0.7–4.0)
MCH: 25.6 pg — ABNORMAL LOW (ref 26.0–34.0)
MCHC: 33 g/dL (ref 30.0–36.0)
MCV: 77.6 fL — ABNORMAL LOW (ref 80.0–100.0)
Monocytes Absolute: 1.1 10*3/uL — ABNORMAL HIGH (ref 0.1–1.0)
Monocytes Relative: 3 %
Neutro Abs: 40.5 10*3/uL — ABNORMAL HIGH (ref 1.7–7.7)
Neutrophils Relative %: 93 %
Platelets: 404 10*3/uL — ABNORMAL HIGH (ref 150–400)
RBC: 2.81 MIL/uL — ABNORMAL LOW (ref 4.22–5.81)
RDW: 22.1 % — ABNORMAL HIGH (ref 11.5–15.5)
WBC: 43.5 10*3/uL — ABNORMAL HIGH (ref 4.0–10.5)
nRBC: 0 % (ref 0.0–0.2)

## 2023-04-29 LAB — COMPREHENSIVE METABOLIC PANEL
ALT: 51 U/L — ABNORMAL HIGH (ref 0–44)
AST: 124 U/L — ABNORMAL HIGH (ref 15–41)
Albumin: <1.5 g/dL — ABNORMAL LOW (ref 3.5–5.0)
Alkaline Phosphatase: 383 U/L — ABNORMAL HIGH (ref 38–126)
Anion gap: 9 (ref 5–15)
BUN: 11 mg/dL (ref 8–23)
CO2: 25 mmol/L (ref 22–32)
Calcium: 7.5 mg/dL — ABNORMAL LOW (ref 8.9–10.3)
Chloride: 100 mmol/L (ref 98–111)
Creatinine, Ser: 1.34 mg/dL — ABNORMAL HIGH (ref 0.61–1.24)
GFR, Estimated: 55 mL/min — ABNORMAL LOW (ref >60)
Glucose, Bld: 118 mg/dL — ABNORMAL HIGH (ref 70–99)
Potassium: 4.1 mmol/L (ref 3.5–5.1)
Sodium: 134 mmol/L — ABNORMAL LOW (ref 135–145)
Total Bilirubin: 1.8 mg/dL — ABNORMAL HIGH (ref <1.2)
Total Protein: 5 g/dL — ABNORMAL LOW (ref 6.5–8.1)

## 2023-04-29 LAB — COOXEMETRY PANEL
Carboxyhemoglobin: 2.4 % — ABNORMAL HIGH (ref 0.5–1.5)
Methemoglobin: 0.7 % (ref 0.0–1.5)
O2 Saturation: 72.5 %
Total hemoglobin: 7.5 g/dL — ABNORMAL LOW (ref 12.0–16.0)

## 2023-04-29 LAB — MAGNESIUM: Magnesium: 2.3 mg/dL (ref 1.7–2.4)

## 2023-04-29 LAB — GLUCOSE, CAPILLARY
Glucose-Capillary: 125 mg/dL — ABNORMAL HIGH (ref 70–99)
Glucose-Capillary: 171 mg/dL — ABNORMAL HIGH (ref 70–99)

## 2023-04-29 LAB — PHOSPHORUS: Phosphorus: 3.4 mg/dL (ref 2.5–4.6)

## 2023-04-29 LAB — PROCALCITONIN: Procalcitonin: 7.32 ng/mL

## 2023-04-29 MED ORDER — POLYVINYL ALCOHOL 1.4 % OP SOLN: 1.0000 [drp] | Freq: Four times a day (QID) | OPHTHALMIC | Status: DC | PRN

## 2023-04-29 MED ORDER — HYDROMORPHONE BOLUS VIA INFUSION: 0.2500 mg | INTRAVENOUS | Status: DC | PRN

## 2023-04-29 MED ORDER — GLYCOPYRROLATE 0.2 MG/ML IJ SOLN
0.2000 mg | INTRAMUSCULAR | Status: DC | PRN
Start: 2023-04-29 — End: 2023-04-30
  Administered 2023-04-29: 0.2 mg via INTRAVENOUS
  Filled 2023-04-29: qty 1

## 2023-04-29 MED ORDER — ACETAMINOPHEN 325 MG PO TABS: 650.0000 mg | ORAL_TABLET | Freq: Four times a day (QID) | ORAL | Status: DC | PRN

## 2023-04-29 MED ORDER — LORAZEPAM 2 MG/ML PO CONC: 1.0000 mg | ORAL | Status: DC | PRN

## 2023-04-29 MED ORDER — HALOPERIDOL LACTATE 2 MG/ML PO CONC: 0.5000 mg | ORAL | Status: DC | PRN

## 2023-04-29 MED ORDER — GLYCOPYRROLATE 0.2 MG/ML IJ SOLN
0.2000 mg | INTRAMUSCULAR | Status: DC | PRN
Start: 2023-04-29 — End: 2023-04-30

## 2023-04-29 MED ORDER — ONDANSETRON HCL 4 MG/2ML IJ SOLN
4.0000 mg | Freq: Four times a day (QID) | INTRAMUSCULAR | Status: DC | PRN
Start: 2023-04-29 — End: 2023-04-30

## 2023-04-29 MED ORDER — LORAZEPAM 1 MG PO TABS: 1.0000 mg | ORAL_TABLET | ORAL | Status: DC | PRN

## 2023-04-29 MED ORDER — GLYCOPYRROLATE 1 MG PO TABS: 1.0000 mg | ORAL_TABLET | ORAL | Status: DC | PRN

## 2023-04-29 MED ORDER — ONDANSETRON 4 MG PO TBDP: 4.0000 mg | ORAL_TABLET | Freq: Four times a day (QID) | ORAL | Status: DC | PRN

## 2023-04-29 MED ORDER — HYDROMORPHONE HCL-NACL 50-0.9 MG/50ML-% IV SOLN
0.5000 mg/h | INTRAVENOUS | Status: DC
  Administered 2023-04-29: 0.5 mg/h via INTRAVENOUS
  Filled 2023-04-29: qty 50

## 2023-04-29 MED ORDER — LORAZEPAM 2 MG/ML IJ SOLN
1.0000 mg | INTRAMUSCULAR | Status: DC | PRN
Start: 2023-04-29 — End: 2023-04-30
  Administered 2023-04-29: 1 mg via INTRAVENOUS
  Filled 2023-04-29: qty 1

## 2023-04-29 MED ORDER — BIOTENE DRY MOUTH MT LIQD: 15.0000 mL | OROMUCOSAL | Status: DC | PRN

## 2023-04-29 MED ORDER — HALOPERIDOL LACTATE 5 MG/ML IJ SOLN
0.5000 mg | INTRAMUSCULAR | Status: DC | PRN
  Administered 2023-04-29: 0.5 mg via INTRAVENOUS
  Filled 2023-04-29: qty 1

## 2023-04-29 MED ORDER — HALOPERIDOL 0.5 MG PO TABS
0.5000 mg | ORAL_TABLET | ORAL | Status: DC | PRN
Start: 2023-04-29 — End: 2023-04-30

## 2023-04-29 MED ORDER — ACETAMINOPHEN 650 MG RE SUPP: 650.0000 mg | Freq: Four times a day (QID) | RECTAL | Status: DC | PRN

## 2023-04-29 MED FILL — Hydralazine HCl Inj 20 MG/ML: INTRAMUSCULAR | Qty: 1 | Status: AC

## 2023-05-01 NOTE — Death Summary Note (Signed)
DEATH SUMMARY   Patient Details  Name: [name] [name] MRN: [id number] DOB: August 02, 1945 WUJ:[id number], [name] Dell, MD Admission/Discharge Information   Admit Date:  02-May-2023  Date of Death: Date of Death: May 11, 2023  Time of Death: Time of Death: 05/16/10  Length of Stay: 9   Principle Cause of death:   1.  Acute on chronic combined systolic/diastolic congestive heart failure  2.  Cardiogenic shock  3.  Cardiorenal syndrome  4.  Acute hypoxic respiratory failure  5.  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 male with past medical history significant for ischemic cardiomyopathy with LVEF 20-25%, paroxysmal atrial fibrillation, essential hypertension, CAD s/p CABG 05/16/2016, COPD, type 2 diabetes mellitus, history of LV thrombus who is readmitted to [facility] on 05/01/24 by cardiology service from acute inpatient rehabilitation for progressive shortness of breath, lower extremity edema.   Significant recent/Hospital events: 11/4: Admit with syncope/fall, atrial fibrillation cardioverted on 11/7, discharge 11/8 11/25: re-admit, right hip pain; noted retroperitoneal hematoma extending to distal psoas/iliac [place], volume overload.  Seen by nephrology/cardiology, started on midodrine, diuresed and discharged to CIR 12/9 on torsemide 12/10: Worsening leukocytosis at CIR, ID consulted, ultrasound-guided aspiration right hip 12/10 by IR; fluid culture with ACTICASEIBACILLUS CASEI, he was treated with few days of IV vancomycin which does not have efficacy against this, leukocytosis largely spontaneously started improving, vancomycin discontinued 12/16    2024-05-01: Readmitted from CIR back to inpatient, 2H with A-fib RVR and CHF May 01, 2024: heart failure team consulted, started on milrinone, Lasix dose increased 12/23: Cardioverted to sinus  rhythm 12/24: Poor response to diuretics 12/27: Nephrology consulted; CVVD started 12/29: Respiratory status worsening despite CRRT, O2 now up to 40L HFNC; did not tolerated NIV; remains on milrinone/norepinephrine drip 05/10/24: Palliative care consulted for assistance with goals of care/medical decision making, overall poor prognosis; transition to comfort measures    Assessment and Plan:  Acute on chronic systolic congestive heart failure; 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  Recently discharged on midodrine.  TTE 03/23/2024 with LVEF 20-25%.  Admitted by cardiology service and started on high-dose Lasix drip, milrinone drip and norepinephrine support.  Continued to be an anuric with worsening renal function and nephrology was consulted and patient started CRRT on 04/26/2023.  Not at candidate for advanced therapies or HD.  Continues with worsening respiratory status.  Cardiology nephrology with no further advanced therapies able to offer and palliative care was consulted.  Given progressive decline including worsening respiratory status, patient and family decided to transition to comfort measures.  CRRT, milrinone and norepinephrine drip were discontinued and patient passed away peacefully on 11-May-2023 at 1712.    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  Etiology likely secondary to cardiorenal syndrome with low output cardiomyopathy as above.  Creatinine trended up to 5.8 on 04/26/2023 and nephrology was consulted to initiate CRRT. -- Nephrology/heart failure service following -- Sodium bicarbonate 600 mg p.o. twice daily -- Continue CRRT -- Continue support with milrinone and norepinephrine drip   Atrial fibrillation with RVR Patient underwent cardioversion by cardiology on 04/22/2023.  Patient was  maintained on amiodarone drip with Eliquis for anticoagulation.   Acute hypoxic respiratory failure Concern for community-acquired pneumonia  MRSA PCR negative.  Patient was started on IV Zosyn on 04/28/2023.  Patient with appointment to 40 L high flow nasal cannula during the hospitalization.  Intolerant to noninvasive ventilation.  Transitioned to comfort measures on 04/16/2023.   Retroperitoneal hematoma distal psoas/iliacus Synthroid secondary to mechanical fall.  Developed profound leukocytosis and CIR with WBC count 40K underwent ultrasound-guided aspiration right hip with culture significant for LACTICASEIBACILLUS CASEI, he was treated with few days of IV vancomycin which has no activity against this, seen by infectious disease, vancomycin discontinued 12/16    Type 2 diabetes mellitus Hemoglobin A1c 6.9.  On Ozempic at baseline.   Severe hypoalbuminemia Nutrition Status: Nutrition Problem: Inadequate oral intake Etiology: acute illness, poor appetite, chronic illness Signs/Symptoms: meal completion < 50%, per patient/family report Interventions: Refer to RD note for recommendations   Goals of care: Patient with severe cardiomyopathy complicated by A-fib with RVR, progressive renal failure, retroperitoneal hematoma and poor response to milrinone, high-dose Lasix and now on CRRT without significant approval of respiratory status.  Patient is not a candidate for long-term HD.  Seen by palliative care, transition to comfort measures on 04/11/2023.       Procedures:  Cardioversion 12/23 Left IJ central line placement, 12/27 Right IJ HD catheter placement, 12/27  Consultations:  Cardiology/heart failure service Nephrology Palliative care  The results of significant diagnostics from this hospitalization (including imaging, microbiology, ancillary and laboratory) are listed below for reference.   Significant Diagnostic Studies: DG CHEST PORT 1 VIEW Result Date:  04/28/2023 CLINICAL DATA:  Acute on chronic congestive heart failure. EXAM: PORTABLE CHEST 1 VIEW COMPARISON:  April 26, 2023. FINDINGS: Stable cardiomediastinal silhouette. Bilateral defibrillators are noted. Stable mild bilateral pulmonary edema. Bony thorax is unremarkable. Minimal bilateral pleural effusions. IMPRESSION: Stable bilateral pulmonary edema with minimal bilateral pleural effusions. Electronically Signed   By: Lupita Raider M.D.   On: 04/28/2023 13:57   PERIPHERAL VASCULAR CATHETERIZATION Result Date: 04/26/2023 Successful HD catheter placement placement.   CARDIAC CATHETERIZATION Result Date: 04/26/2023 Successful central line placement.   DG CHEST PORT 1 VIEW Result Date: 04/26/2023 CLINICAL DATA:  Shortness of breath. EXAM: PORTABLE CHEST 1 VIEW COMPARISON:  AP chest 04/23/2023, 04/22/2023, 04/19/2023 FINDINGS: Redemonstration of median sternotomy. Left chest wall cardiac pacer and right chest wall AICD and leads are grossly unchanged. Right internal jugular Swan-Ganz catheter with tip overlying the pulmonary outflow tract is unchanged. Cardiac silhouette is again mildly enlarged. Mediastinal contours are unchanged. Moderate to high-grade atherosclerotic calcifications within the aortic arch. Blunting of left greater than right costophrenic angles with small pleural effusions, similar to prior. No pneumothorax is seen. Moderate diffuse bilateral interstitial thickening is similar to multiple prior radiographs including 04/02/2023 but slightly increased from 01/16/2023, likely reflecting mild-to-moderate interstitial pulmonary edema. No acute skeletal abnormality. There is elevation of the right clavicular head with respect to the acromion, unchanged from 04/09/2023 radiograph. IMPRESSION: 1. Unchanged Swan-Ganz catheter position. 2. Unchanged mild cardiomegaly and mild-to-moderate interstitial pulmonary edema. 3. Unchanged small bilateral pleural effusions. Electronically Signed    By: Neita Garnet M.D.   On: 04/26/2023 10:24   Port CXR Result Date: 04/23/2023 CLINICAL DATA:  Central line placement EXAM: PORTABLE CHEST 1 VIEW COMPARISON:  04/22/2023 FINDINGS: Cardiac pacemakers. Generator packs in the right and left upper chest. Postoperative changes in the mediastinum with sternotomy wires and surgical clips in the mediastinum. Right Swan-Ganz catheter with tip over the right hilum. Mild cardiac enlargement. Mild perihilar infiltration possibly indicating edema. Probable small pleural effusions.  No pneumothorax. Calcification of the aorta. IMPRESSION: Cardiac enlargement with mild perihilar infiltration and probable small pleural effusions. Appliances appear in satisfactory position. Electronically Signed   By: Burman Nieves M.D.   On: 04/23/2023 17:49   CARDIAC CATHETERIZATION Result Date: 04/23/2023 Findings: On milrinone 0.25 mcg/kg/min RA = 14 RV = 58/18 PA = 56/25 (40) PCW = 28 with v waves to 45 Fick cardiac output/index = 5.8/2.6 TD CO/CI = 4.7/2.1 PVR = 2.1 (Fick) 2.6 (TD) Ao sat = 97% PA sat = 46%, 48% PAPi = 2.2 Assessment: 1. Severe biventricular HF with marked fluid overload and prominent v-waves suggestive of significant MR (versus severe diastolic dysfunction Plan/Discussion: Will continue to push diuresis. Adjust inotropes as able. Arvilla Meres, MD 1:05 PM  DG CHEST PORT 1 VIEW Result Date: 04/23/2023 CLINICAL DATA:  Hypoxia EXAM: PORTABLE CHEST 1 VIEW COMPARISON:  04/19/2023 FINDINGS: Prior median sternotomy. Bilateral pacer/AICD again noted, unchanged. Cardiomegaly with vascular congestion. Interstitial prominence may reflect interstitial edema. Layering bilateral pleural effusions. Left base atelectasis. IMPRESSION: 1. Cardiomegaly with vascular congestion and interstitial edema. 2. Layering bilateral pleural effusions. Electronically Signed   By: Charlett Nose M.D.   On: 04/23/2023 00:34   [place] EKG SITE RITE Result Date: 04/22/2023 If Site Rite image  not attached, placement could not be confirmed due to current cardiac rhythm.  EP STUDY Result Date: 04/22/2023 See surgical note for result.  [place] EKG SITE RITE Result Date: 04/20/2023 If Site Rite image not attached, placement could not be confirmed due to current cardiac rhythm.  VAS [place] LOWER EXTREMITY VENOUS (DVT) Result Date: 04/20/2023  Lower Venous DVT Study Patient Name:  MYKAEL DEPENA  Date of Exam:   04/19/2023 Medical Rec #: [id number]         Accession #:    [id number] Date of Birth: Nov 22, 1945         Patient Gender: M Patient Age:   14 years Exam Location:  [facility] Procedure:      VAS [place] LOWER EXTREMITY VENOUS (DVT) Referring Phys: Elijah Birk --------------------------------------------------------------------------------  Indications: Swelling, Pain, and right calf. Other Indications: Recent fall after syncopal episode on 11/4. Comparison Study: 04/08/23 -Negative Performing Technologist: Marilynne Halsted RDMS, RVT  Examination Guidelines: A complete evaluation includes B-mode imaging, spectral Doppler, color Doppler, and power Doppler as needed of all accessible portions of each vessel. Bilateral testing is considered an integral part of a complete examination. Limited examinations for reoccurring indications may be performed as noted. The reflux portion of the exam is performed with the patient in reverse Trendelenburg.  +---------+---------------+---------+-----------+----------+--------------+ RIGHT    CompressibilityPhasicitySpontaneityPropertiesThrombus Aging +---------+---------------+---------+-----------+----------+--------------+ CFV      Full           Yes      Yes                                 +---------+---------------+---------+-----------+----------+--------------+ SFJ      Full                                                        +---------+---------------+---------+-----------+----------+--------------+ FV Prox  Full                                                         +---------+---------------+---------+-----------+----------+--------------+  FV Mid   Full                                                        +---------+---------------+---------+-----------+----------+--------------+ FV DistalFull                                                        +---------+---------------+---------+-----------+----------+--------------+ PFV      Full                                                        +---------+---------------+---------+-----------+----------+--------------+ POP      Full           Yes      Yes                                 +---------+---------------+---------+-----------+----------+--------------+ PTV      Full                                                        +---------+---------------+---------+-----------+----------+--------------+ PERO     Full                                                        +---------+---------------+---------+-----------+----------+--------------+   +---------+---------------+---------+-----------+----------+--------------+ LEFT     CompressibilityPhasicitySpontaneityPropertiesThrombus Aging +---------+---------------+---------+-----------+----------+--------------+ CFV      Full                    Yes                                 +---------+---------------+---------+-----------+----------+--------------+ SFJ      Full                                                        +---------+---------------+---------+-----------+----------+--------------+ FV Prox  Full                                                        +---------+---------------+---------+-----------+----------+--------------+ FV Mid   Full                                                        +---------+---------------+---------+-----------+----------+--------------+  FV DistalFull                                                         +---------+---------------+---------+-----------+----------+--------------+ PFV      Full                                                        +---------+---------------+---------+-----------+----------+--------------+ POP      Full           Yes      Yes                                 +---------+---------------+---------+-----------+----------+--------------+ PTV      Full                                                        +---------+---------------+---------+-----------+----------+--------------+ PERO     Full                                                        +---------+---------------+---------+-----------+----------+--------------+     Summary: BILATERAL: - No evidence of deep vein thrombosis seen in the lower extremities, bilaterally. -No evidence of popliteal cyst, bilaterally.   *See table(s) above for measurements and observations. Electronically signed by Coral Else MD on 04/20/2023 at 10:03:45 AM.    Final    DG Chest Port 1 View Result Date: 04/19/2023 CLINICAL DATA:  Shortness of breath for a day EXAM: PORTABLE CHEST 1 VIEW COMPARISON:  X-ray 04/09/2023.  Older exams as well. FINDINGS: Sternal wires. Enlarged cardiopericardial silhouette with a calcified aorta. Bilateral upper chest battery packs with leads along the right side of the heart. Please correlate with history. Increasing small bilateral pleural effusions with some adjacent opacity. Mild interstitial prominence. No pneumothorax. IMPRESSION: Postop chest with enlarged heart. 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 Increasing small bilateral pleural effusions with some vascular congestion and trace interstitial prominence. Recommend follow up Electronically Signed   By: Karen Kays M.D.   On: 04/19/2023 12:21   VAS [place] LOWER EXTREMITY VENOUS (DVT) Result Date: 04/19/2023  Lower Venous DVT Study Patient Name:  KHAIDYN JEWART  Date of Exam:   04/08/2023 Medical Rec #: [id number]          Accession #:    [id number] Date of Birth: 1946/02/17         Patient Gender: M Patient Age:   67 years Exam Location:  [facility] Procedure:      VAS [place] LOWER EXTREMITY VENOUS (DVT) Referring Phys: PARDEEP KHATRI --------------------------------------------------------------------------------  Indications: Swelling, and Edema.  Comparison Study: Previous study on 11.26.2024. Performing Technologist: Fernande Bras  Examination Guidelines: A complete evaluation includes B-mode imaging, spectral Doppler, color Doppler, and power Doppler as needed of  all accessible portions of each vessel. Bilateral testing is considered an integral part of a complete examination. Limited examinations for reoccurring indications may be performed as noted. The reflux portion of the exam is performed with the patient in reverse Trendelenburg.  +---------+---------------+---------+-----------+----------+--------------+ RIGHT    CompressibilityPhasicitySpontaneityPropertiesThrombus Aging +---------+---------------+---------+-----------+----------+--------------+ CFV      Full           Yes      Yes                                 +---------+---------------+---------+-----------+----------+--------------+ SFJ      Full                                                        +---------+---------------+---------+-----------+----------+--------------+ FV Prox  Full                                                        +---------+---------------+---------+-----------+----------+--------------+ FV Mid   Full                                                        +---------+---------------+---------+-----------+----------+--------------+ FV DistalFull                                                        +---------+---------------+---------+-----------+----------+--------------+ PFV      Full                                                         +---------+---------------+---------+-----------+----------+--------------+ POP      Full           Yes      Yes                                 +---------+---------------+---------+-----------+----------+--------------+ PTV      Full                                                        +---------+---------------+---------+-----------+----------+--------------+ PERO     Full                                                        +---------+---------------+---------+-----------+----------+--------------+   +----+---------------+---------+-----------+----------+--------------+ LEFTCompressibilityPhasicitySpontaneityPropertiesThrombus Aging +----+---------------+---------+-----------+----------+--------------+  CFV Full           Yes      Yes                                 +----+---------------+---------+-----------+----------+--------------+     Summary: RIGHT: - There is no evidence of deep vein thrombosis in the lower extremity.  - No cystic structure found in the popliteal fossa.  LEFT: - No evidence of common femoral vein obstruction.   *See table(s) above for measurements and observations. Electronically signed by Carolynn Sayers on 04/09/2023 at 7:24:06 AM.    Final (Updated)    [place] DRAIN/INJ MAJOR JOINT/BURSA Result Date: 04/12/2023 INDICATION: 78 year old male with fluid collection involving the hip flexors on the right based on prior CT 04/03/2023. He has been referred aspiration for diagnostic purposes. EXAM: ULTRASOUND-GUIDED ASPIRATION HIP FLUID COLLECTION MEDICATIONS: None ANESTHESIA/SEDATION: Moderate (conscious) sedation was not employed during this procedure. COMPLICATIONS: None PROCEDURE: Informed written consent was obtained from the patient after a thorough discussion of the procedural risks, benefits and alternatives. All questions were addressed. Maximal Sterile Barrier Technique was utilized including caps, mask, sterile gowns, sterile gloves, sterile drape,  hand hygiene and skin antiseptic. A timeout was performed prior to the initiation of the procedure. Patient was positioned supine.  Ultrasound images were performed. The patient is prepped and draped in the usual sterile fashion. 1% lidocaine was used for local anesthesia. 18 gauge needle was advanced into the fluid collection without significant aspiration. A Yueh needle was then advanced. We were able to achieve approximately 30 cc of aspiration turbid brownish fluid. This was sent for culture. Catheter was removed and a final image was stored. Sterile bandage was placed. Patient tolerated the procedure well and remained hemodynamically stable throughout. No complications were encountered and no significant blood loss IMPRESSION: Status post ultrasound-guided aspiration of right hip fluid collection involving the hip flexors. Signed, Yvone Neu. Miachel Roux, RPVI [facility] Radiology Electronically Signed   By: Gilmer Mor D.O.   On: 04/12/2023 09:38   DG CHEST PORT 1 VIEW Result Date: 04/09/2023 CLINICAL DATA:  Leukocytosis. EXAM: PORTABLE CHEST 1 VIEW COMPARISON:  Chest radiograph dated 04/02/2023. FINDINGS: No focal consolidation, pleural effusion, pneumothorax. Mild cardiomegaly. Median sternotomy wires. Cardiac pacemakers noted. Atherosclerotic calcification of the aorta. No acute osseous pathology. IMPRESSION: 1. No active disease. 2. Mild cardiomegaly. Electronically Signed   By: Elgie Collard M.D.   On: 04/09/2023 17:41   CT ABDOMEN PELVIS WO CONTRAST Result Date: 04/03/2023 CLINICAL DATA:  Sepsis.  Hip pain. EXAM: CT ABDOMEN AND PELVIS WITHOUT CONTRAST TECHNIQUE: Multidetector CT imaging of the abdomen and pelvis was performed following the standard protocol without IV contrast. RADIATION DOSE REDUCTION: This exam was performed according to the departmental dose-optimization program which includes automated exposure control, adjustment of the  mA and/or kV according to patient size and/or use of iterative reconstruction technique. COMPARISON:  Abdominopelvic CT 03/26/2023.  Pelvic CT 04/16/2019. FINDINGS: Lower chest: Cardiac pacemaker leads extend into the right atrium and right ventricle. There is aortic and coronary artery atherosclerosis. Unchanged moderate right and small left pleural effusions with associated compressive atelectasis at both lung bases. The recently demonstrated right middle lobe nodule is incompletely visualized, although grossly stable. No new nodularity identified at the visualized lung bases. There is underlying mild to moderate centrilobular emphysema. Hepatobiliary: The liver appears stable, without focal abnormality on noncontrast imaging. No  evidence of significant biliary dilatation status post cholecystectomy. Pancreas: Unremarkable. No pancreatic ductal dilatation or surrounding inflammatory changes. Spleen: Normal in size without focal abnormality. Adrenals/Urinary Tract: Both adrenal glands appear normal. No evidence of urinary tract calculus, suspicious renal lesion or hydronephrosis. Stable renal atrophy and cortical scarring on the right. Stable renal vascular calcifications bilaterally. The bladder appears unremarkable for its degree of distention. Stomach/Bowel: Small amount of enteric contrast in the stomach. The stomach appears unremarkable for its degree of distension. No evidence of bowel wall thickening, distention or surrounding inflammatory change. The appendix is tentatively identified and appears unremarkable. There is prominent stool throughout the colon. Mild diverticular changes throughout the descending and sigmoid colon. Vascular/Lymphatic: There are no enlarged abdominal or pelvic lymph nodes. Extensive aortic and branch vessel atherosclerosis again noted without evidence of aneurysm. Reproductive: The prostate gland and seminal vesicles appear unremarkable. Other: No significant change in recently  demonstrated multifocal fluid collections along the course of the right iliopsoas musculature. A superior component along the right posterolateral abdominal wall measures 4.3 x 3.0 cm on image 41/3. Ill-defined component associated with the right iliacus muscle measures approximately 7.8 x 4.3 cm on image 56/3. Distal component anterior to the right femoral head measures approximately 4.4 x 2.4 cm on image 82/3 and may be slightly larger. These collections do not demonstrate any high-density components or air. No ascites or pneumoperitoneum. No abdominal wall hernia. Musculoskeletal: No evidence of acute fracture, dislocation or osteomyelitis. Degenerative changes of the sacroiliac joints and mild multilevel lumbar spondylosis. Subcutaneous edema in both flanks has mildly increased. There is asymmetric extension of subcutaneous edema into the proximal right thigh. IMPRESSION: 1. No significant change in recently demonstrated multifocal fluid collections along the course of the right iliopsoas musculature. The distal component anterior to the right femoral head may be slightly larger. These collections do not demonstrate any high-density components or air and could be secondary to subacute or chronic hematoma versus bursitis. In the setting of sepsis, aspiration should be considered to exclude infection. 2. No evidence of osteomyelitis or discitis. 3. Unchanged moderate right and small left pleural effusions with associated compressive atelectasis at both lung bases. 4. Mildly increased subcutaneous edema in both flanks with asymmetric extension into the proximal right thigh. 5. Incompletely visualized, but grossly stable right middle lobe nodule. See follow up recommendations from previous chest CT of 03/26/2023. 6. Aortic Atherosclerosis (ICD10-I70.0) and Emphysema (ICD10-J43.9). Electronically Signed   By: Carey Bullocks M.D.   On: 04/03/2023 16:02   DG CHEST PORT 1 VIEW Result Date: 04/02/2023 CLINICAL DATA:   78 year old male with shortness of breath and leukocytosis. EXAM: PORTABLE CHEST 1 VIEW COMPARISON:  Portable chest 03/28/2023 and earlier. FINDINGS: Portable AP semi upright view at 0759 hours. Stable cardiomegaly and mediastinal contours. Stable cardiac pacemakers/ICD. Stable lung volumes. No pneumothorax, pulmonary edema, pleural effusion or confluent lung opacity. Pulmonary vascularity appears stable since 03/04/2023. Calcified aortic atherosclerosis. Visualized tracheal air column is within normal limits. Negative visible bowel gas. IMPRESSION: Stable.  No acute cardiopulmonary abnormality. Electronically Signed   By: Odessa Fleming M.D.   On: 04/02/2023 11:26   IR Fluoro Guide CV Line Right Result Date: 04/01/2023 INDICATION: Heart failure patient, Milrinone therapy access EXAM: TUNNELED PICC LINE WITH ULTRASOUND AND FLUOROSCOPIC GUIDANCE MEDICATIONS: 1% lidocaine local. ANESTHESIA/SEDATION: None. FLUOROSCOPY: Radiation Exposure Index (as provided by the fluoroscopic device): 9.0 mGy Kerma COMPLICATIONS: None immediate. PROCEDURE: Informed written consent was obtained from the patient after a discussion of the risks, benefits,  and alternatives to treatment. Questions regarding the procedure were encouraged and answered. The right neck and chest were prepped with chlorhexidine in a sterile fashion, and a sterile drape was applied covering the operative field. Maximum barrier sterile technique with sterile gowns and gloves were used for the procedure. A timeout was performed prior to the initiation of the procedure. After creating a small venotomy incision, a micropuncture kit was utilized to access the right internal jugular vein under direct, real-time ultrasound guidance after the overlying soft tissues were anesthetized with 1% lidocaine with epinephrine. Ultrasound image documentation was performed. The microwire was kinked to measure appropriate catheter length. The micropuncture sheath was exchanged for a  peel-away sheath over a guidewire. A 5 French dual lumen tunneled PICC measuring 28 cm was tunneled in a retrograde fashion from the anterior chest wall to the venotomy incision. The catheter was then placed through the peel-away sheath with tip ultimately positioned at the superior caval-atrial junction. Final catheter positioning was confirmed and documented with a spot radiographic image. The catheter aspirates and flushes normally. The catheter was flushed with appropriate volume heparin dwells. The catheter exit site was secured with a 0-Prolene retention suture. The venotomy incision was closed with an interrupted 4-0 Vicryl, Dermabond and Steri-strips. Dressings were applied. The patient tolerated the procedure well without immediate post procedural complication. FINDINGS: After catheter placement, the tip lies within the superior cavoatrial junction. The catheter aspirates and flushes normally and is ready for immediate use. IMPRESSION: Successful placement of 28cm dual lumen tunneled PICC catheter via the right internal jugular vein with tip terminating at the superior caval atrial junction. The catheter is ready for immediate use. Electronically Signed   By: Judie Petit.  Shick M.D.   On: 04/01/2023 10:07   IR US Guide Vasc Access Right Result Date: 04/01/2023 INDICATION: Heart failure patient, Milrinone therapy access EXAM: TUNNELED PICC LINE WITH ULTRASOUND AND FLUOROSCOPIC GUIDANCE MEDICATIONS: 1% lidocaine local. ANESTHESIA/SEDATION: None. FLUOROSCOPY: Radiation Exposure Index (as provided by the fluoroscopic device): 9.0 mGy Kerma COMPLICATIONS: None immediate. PROCEDURE: Informed written consent was obtained from the patient after a discussion of the risks, benefits, and alternatives to treatment. Questions regarding the procedure were encouraged and answered. The right neck and chest were prepped with chlorhexidine in a sterile fashion, and a sterile drape was applied covering the operative field. Maximum  barrier sterile technique with sterile gowns and gloves were used for the procedure. A timeout was performed prior to the initiation of the procedure. After creating a small venotomy incision, a micropuncture kit was utilized to access the right internal jugular vein under direct, real-time ultrasound guidance after the overlying soft tissues were anesthetized with 1% lidocaine with epinephrine. Ultrasound image documentation was performed. The microwire was kinked to measure appropriate catheter length. The micropuncture sheath was exchanged for a peel-away sheath over a guidewire. A 5 French dual lumen tunneled PICC measuring 28 cm was tunneled in a retrograde fashion from the anterior chest wall to the venotomy incision. The catheter was then placed through the peel-away sheath with tip ultimately positioned at the superior caval-atrial junction. Final catheter positioning was confirmed and documented with a spot radiographic image. The catheter aspirates and flushes normally. The catheter was flushed with appropriate volume heparin dwells. The catheter exit site was secured with a 0-Prolene retention suture. The venotomy incision was closed with an interrupted 4-0 Vicryl, Dermabond and Steri-strips. Dressings were applied. The patient tolerated the procedure well without immediate post procedural complication. FINDINGS: After catheter placement, the  tip lies within the superior cavoatrial junction. The catheter aspirates and flushes normally and is ready for immediate use. IMPRESSION: Successful placement of 28cm dual lumen tunneled PICC catheter via the right internal jugular vein with tip terminating at the superior caval atrial junction. The catheter is ready for immediate use. Electronically Signed   By: Judie Petit.  Shick M.D.   On: 04/01/2023 10:07    Microbiology: Recent Results (from the past 240 hours)  MRSA Next Gen by PCR, Nasal     Status: None   Collection Time: 04/20/23 10:29 AM   Specimen: Nasal  Mucosa; Nasal Swab  Result Value Ref Range Status   MRSA by PCR Next Gen NOT DETECTED NOT DETECTED Final    Comment: (NOTE) The GeneXpert MRSA Assay (FDA approved for NASAL specimens only), is one component of a comprehensive MRSA colonization surveillance program. It is not intended to diagnose MRSA infection nor to guide or monitor treatment for MRSA infections. Test performance is not FDA approved in patients less than 59 years old. Performed at [facility], [address] [place], [place] [address]   Culture, blood (Routine X 2) w Reflex to ID Panel     Status: None (Preliminary result)   Collection Time: 04/27/23  9:27 AM   Specimen: BLOOD RIGHT ARM  Result Value Ref Range Status   Specimen Description BLOOD RIGHT ARM  Final   Special Requests   Final    BOTTLES DRAWN AEROBIC AND ANAEROBIC Blood Culture results may not be optimal due to an inadequate volume of blood received in culture bottles   Culture   Final    NO GROWTH 2 DAYS Performed at [facility], [address] [place], [place] [address]    Report Status PENDING  Incomplete  Culture, blood (Routine X 2) w Reflex to ID Panel     Status: None (Preliminary result)   Collection Time: 04/27/23  9:29 AM   Specimen: BLOOD LEFT ARM  Result Value Ref Range Status   Specimen Description BLOOD LEFT ARM  Final   Special Requests   Final    BOTTLES DRAWN AEROBIC AND ANAEROBIC Blood Culture results may not be optimal due to an inadequate volume of blood received in culture bottles   Culture   Final    NO GROWTH 2 DAYS Performed at [facility], [address], [place], [place] [address]    Report Status PENDING  Incomplete    Time spent: 35 minutes  Signed: Alvira Philips [place], DO 04/04/2023

## 2023-05-01 NOTE — Progress Notes (Signed)
Pt removed from heated high flow at this time per family request. Pt is currently on comfort care measures. RT will continue to be available as needed.

## 2023-05-01 NOTE — Plan of Care (Signed)
Wasted 35 mL of IV hydromorphone with Alesia Richards RN in [facility] med room.   Marletta Lor RN 04/13/2023

## 2023-05-01 NOTE — Consult Note (Signed)
Palliative Care Consult Note                                  Date: 04/13/2023   Patient Name: [name] [name]  DOB: 10-24-1945  MRN: [id number]  Age / Sex: 78 y.o., male  PCP: [name], MD Referring Physician: [place], Anevay Campanella J, DO  Reason for Consultation: Establishing goals of care  HPI/Patient Profile: 78 y.o. male  with past medical history of  ischemic cardiomyopathy with LVEF 20-25%, paroxysmal atrial fibrillation, essential hypertension, CAD s/p CABG 2018, COPD, type 2 diabetes mellitus, history of LV thrombus who is readmitted to [facility] on 12/21 by cardiology service from acute inpatient rehabilitation for progressive shortness of breath, lower extremity edema. 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   Palliative medicine was consulted for GOC conversations.  Past Medical History:  Diagnosis Date   AICD (automatic cardioverter/defibrillator) present    Arthritis    Bladder cancer (HCC)    Borderline glaucoma    Chronic systolic (congestive) heart failure (HCC)    Coronary artery disease    Diabetes mellitus type 2, controlled (HCC) ORAL MED   Essential hypertension    Heart murmur    Hepatitis    Medical history non-contributory    Paroxysmal A-fib (HCC)    Paroxysmal A-fib (HCC) 08/20/2020   Peripheral vascular disease (HCC)    Pneumonia     Subjective:   This NP Wynne Dust reviewed medical records, received report from team, assessed the patient and then meet at the patient's bedside to discuss diagnosis, prognosis, GOC, EOL wishes disposition and options.  I met with the patient at the bedside.  Also present was the patient's wife along with 2 other family members.   We meet to discuss diagnosis prognosis, GOC, EOL wishes,  disposition and options. Concept of Palliative Care was introduced as specialized medical care for people and their families living with serious illness.  If focuses on providing relief from the symptoms and stress of a serious illness.  The goal is to improve quality of life for both the patient and the family. Values and goals of care important to patient and family were attempted to be elicited.  Created space and opportunity for patient  and family to explore thoughts and feelings regarding current medical situation   Natural trajectory and current clinical status were discussed. Questions and concerns addressed. Patient  encouraged to call with questions or concerns.    Patient/Family Understanding of Illness: They understand that he had a fall on 11/4 and was admitted for couple weeks but is now back.  They understand that he is end-of-life.  Cardiology has been very clear that there are no treatment options left.  They know that he is on CRRT but this is only temporary and there is no plans for ongoing hemodialysis.  The patient states "I just want to die."  Life Review: Deferred at patient and family request  Patient Values: Family, comfort at end-of-life  Goals: Transition to comfort care  Today's Discussion: In addition to discussions described above we had significant discussion of various topics.  We talked about his clinical situation and reviewed my agreement that he is at end-of-life with end-stage heart failure, end-stage cardiorenal syndrome, no available treatments left for  cure.  The patient understands this and states that he wants to "just get home with dying."  He states that he is in constant pain at this point.  He is not doing well and appears to be dyspneic.    Based on this conversation we did review option for comfort care. I explained comfort care as care where the patient would no longer receive aggressive medical interventions such as continuous vital signs, lab  work, radiology testing, or medications not focused on comfort, peace, and dignity. This includes stopping antibiotics and weaning oxygen to room air, as these are generally not accepted as providing comfort but only prolonging the dying process artificially. All care would focus on how the patient is looking and feeling. This would include management of any symptoms that may cause discomfort, pain, shortness of breath/air hunger, increased work of breathing, cough, nausea, agitation/restlessness, anxiety, and/or secretions etc. Symptoms would be managed with medications and other non-pharmacological interventions such as spiritual support if requested, repositioning, music therapy, or therapeutic listening. Family verbalized understanding and agreement.  At the end of my discussion the patient states that he wants to engage with comfort care and states "lets get this going" and that he wants to transition to comfort as soon as possible.  For Prudence I also addressed hospice care. I described hospice as a service for patients who have a life expectancy of 6 months or less. The goal of hospice is the preservation of dignity and quality at the end phases of life. Under hospice care, the focus changes from curative to symptom relief. I explained the three setting where hospice services can be provided including the home, at a living facility (such as LTC SNF, Assisted Living, etc), and a hospice facility. I explained that acceptance to hospice in any specific location is the final decision of the hospice medical director and bed availability, if applicable. They verbalized understanding.  However I shared that I did not feel that he would be stable enough to transfer and family and patient both agreed.  Because of this we have declined to engage with hospice.  Because of the urgency expressed by the patient and family we ended our conversations and I told him that I would start putting in orders for comfort care.   Explained that we would start a pain medication drip with a wide range available to nursing staff to adjust to his comfort.  I explained that there would be other medications available as well.  I shared my contact card with palliative contact information to family can call at anytime for any questions or concerns.  I also explained that nursing staff and medical staff knows how to contact palliative medicine.  I shared the palliative medicine will continue to follow daily while he is admitted for comfort care.  I shared the process by which we would transition to comfort.  I shared that we would start the pain medication drip and then nursing would begin titrating his life supporting drips down to off.  We would continuously adjust his medications for comfort, peace, dignity.  I also shared that CRRT would be stopped as well.  They all verbalized understanding.  I provided emotional and general support through therapeutic listening, empathy, sharing of stories, therapeutic touch, and other techniques. I answered all questions and addressed all concerns to the best of my ability.  Review of Systems  Constitutional:  Positive for fatigue.       "Pain all over"  Respiratory:  Positive  for shortness of breath.   Gastrointestinal:  Negative for nausea.  Neurological:  Positive for weakness.    Objective:   Primary Diagnoses: Present on Admission:  Acute on chronic systolic heart failure [facility])   Physical Exam Vitals and nursing note reviewed.  Constitutional:      Appearance: Normal appearance. He is ill-appearing.     Comments: Appears quite uncomfortable  HENT:     Head: Normocephalic and atraumatic.  Cardiovascular:     Rate and Rhythm: Normal rate.  Pulmonary:     Comments: Increased work of breathing Abdominal:     General: Abdomen is flat.  Skin:    General: Skin is warm and dry.  Neurological:     General: No focal deficit present.     Mental Status: He is alert.   Psychiatric:        Mood and Affect: Mood normal.        Behavior: Behavior normal.     Vital Signs:  BP (!) 92/56   Pulse 86   Temp 97.8 F (36.6 C) (Oral)   Resp (!) 28   Ht 6\' 2"  (1.88 m)   Wt 91.1 kg   SpO2 96%   BMI 25.79 kg/m   Palliative Assessment/Data: 10%    Advanced Care Planning:   Existing Vynca/ACP Documentation: None  Primary Decision Maker: PATIENT  Code Status/Advance Care Planning: DNR-limited  A discussion was had today regarding advanced directives. Concepts specific to code status, artifical feeding and hydration, continued IV antibiotics and rehospitalization was had.  The difference between a aggressive medical intervention path and a palliative comfort care path for this patient at this time was had.   Decisions/Changes to ACP: Changed to DNR-comfort Initiate comfort care  Assessment & Plan:   Impression: 78 year old male with acute presentation chronic comorbidities as described above.  In essence, the patient is end-stage heart failure and end-stage cardiorenal syndrome.  There are no treatment options left that he is not a candidate for hemodialysis or advanced heart failure therapies.  The patient understands that he is approaching end-of-life and is electing to transition to comfort care.  We will implement comfort care soon as possible the patient and family's request.  Palliative medicine will continue to follow.  Overall prognosis grave.  SUMMARY OF RECOMMENDATIONS   Changed to DNR-comfort Transition to comfort care When Dilaudid drip has begun and patient is comfortable can begin titrating life-prolonging drips down and eventually to off See symptom management orders below Palliative medicine will continue to follow  Symptom Management:  Tylenol 650 mg PR every 6 hours as needed mild pain or fever Robinul 0.2 mg IV every 4 hours as needed excessive secretions Haldol 0.5 mg IV every 4 hours as needed agitation or  delirium Dilaudid infusion 0.5 to 4 mg/h titrate for comfort, respiratory distress Dilaudid bolus via infusion 0.25 to 1 mg IV every 30 minutes as needed to alleviate symptoms of distress Ativan 1 mg IV every 4 hours as needed anxiety Zofran 4 mg IV every 6 hours as needed nausea or vomiting Polyvinyl alcohol 1 4% ophthalmic 1 drop OU 4 times daily as needed dry eyes  Prognosis:  Hours - Days  Discharge Planning:  Anticipated Hospital Death   Discussed with: Patient, family, medical team, nursing team    Thank you for allowing [place] to participate in the care of JAMESANDREW KERNAGHAN PMT will continue to support holistically.  Time Total: 75 min  Detailed review of medical records (labs, imaging,  vital signs), medically appropriate exam, discussed with treatment team, counseling and education to patient, family, & staff, documenting clinical information, medication management, coordination of care  Signed by: Wynne Dust, NP Palliative Medicine Team  Team Phone # [phone] (Nights/Weekends)  04/16/2023, 3:00 PM

## 2023-05-01 NOTE — Progress Notes (Addendum)
Patient ID: [name] [name], male   DOB: December 24, 1945, 78 y.o.   MRN: [id number]     Advanced Heart Failure Rounding Note  Cardiologist: Arvilla Meres, MD   Chief complaint:   Subjective:    12/22: started on empiric milrinone 0.125 + high dose lasix, 160 IV x 2 12/23: s/p DCCV>>NSR. Milrinone increased to 0.25.  12/24: RHC 12/24 (On milrinone 0.25 mcg/kg/min) RA = 14 RV = 58/18 PA = 56/25 (40) PCW = 28 with v waves to 45 Fick cardiac output/index = 5.8/2.6 TD CO/CI = 4.7/2.1 PVR = 2.1 (Fick) 2.6 (TD) Ao sat = 97% PA sat = 46%, 48% PAPi = 2.2 Lasix gtt increased to 30  12/25: Midodrine discontinued. Started on NE  12/27: CVVH begun  Remains on 0.25 milrinone, NE 8>>12 overnight.  3.7L volume removed with CRRT yesterday. CVP 3.   Continues on HHFNC 40 L/min, FiO2 90%. Unable to tolerate BiPAP. WBCs up to 44K, AF. Remains on Zosyn.  Fatigued but otherwise states he feels "okay".    Objective:   Weight Range: 91.1 kg Body mass index is 25.79 kg/m.   Vital Signs:   Temp:  [97.7 F (36.5 C)-98.7 F (37.1 C)] 97.8 F (36.6 C) (12/30 0742) Pulse Rate:  [83-91] 91 (12/30 0800) Resp:  [12-25] 20 (12/30 0800) BP: (80-114)/(28-59) 110/45 (12/30 0800) SpO2:  [84 %-100 %] 97 % (12/30 0800) FiO2 (%):  [90 %-100 %] 90 % (12/30 0742) Weight:  [91.1 kg] 91.1 kg (12/30 0500) Last BM Date : 04/27/23  Weight change: Filed Weights   04/27/23 0500 04/28/23 0500 04/08/2023 0500  Weight: 97.3 kg 96.6 kg 91.1 kg    Intake/Output:   Intake/Output Summary (Last 24 hours) at 04/19/2023 0941 Last data filed at 04/01/2023 0800 Gross per 24 hour  Intake 1806.55 ml  Output 3640.7 ml  Net -1834.15 ml      Physical Exam  General:  Critically ill appearing HEENT: normal Neck: supple. no JVD.  L internal jugular CVC, , R internal jugular HD cath Cor: PMI nondisplaced. Regular rate & rhythm. No rubs, gallops or murmurs. Lungs: bibasilar crackles Abdomen: soft, nontender,  nondistended. No hepatosplenomegaly. No bruits or masses. Good bowel sounds. Extremities: no cyanosis, clubbing, rash, no LE edema, LUE more swollen than R.  Neuro: alert & orientedx3. Affect flat.    Telemetry  SR 80s-90s  Labs    CBC Recent Labs    04/28/23 0325 04/05/2023 0456  WBC 27.9* 43.5*  NEUTROABS 25.4* 40.5*  HGB 7.3* 7.2*  HCT 22.2* 21.8*  MCV 77.4* 77.6*  PLT 480* 404*   Basic Metabolic Panel Recent Labs    95/62/13 0325 04/28/23 1515 04/19/2023 0456  NA 131* 131* 134*  K 4.1 4.0 4.1  CL 98 98 100  CO2 24 24 25   GLUCOSE 151* 57* 118*  BUN 18 14 11   CREATININE 1.73* 1.46* 1.34*  CALCIUM 7.6* 7.7* 7.5*  MG 2.2  --  2.3  PHOS 2.9 1.4* 3.4   Liver Function Tests Recent Labs    04/28/23 1515 04/08/2023 0456  AST  --  124*  ALT  --  51*  ALKPHOS  --  383*  BILITOT  --  1.8*  PROT  --  5.0*  ALBUMIN <1.5* <1.5*    No results for input(s): "LIPASE", "AMYLASE" in the last 72 hours. Cardiac Enzymes No results for input(s): "CKTOTAL", "CKMB", "CKMBINDEX", "TROPONINI" in the last 72 hours.  BNP: BNP (last 3 results) Recent Labs  04/20/23 0432 04/20/23 2119 04/21/23 0921  BNP 2,698.9* 2,078.7* 1,292.0*    ProBNP (last 3 results) Recent Labs    09/25/22 0953  PROBNP 2,187*     D-Dimer No results for input(s): "DDIMER" in the last 72 hours. Hemoglobin A1C No results for input(s): "HGBA1C" in the last 72 hours. Fasting Lipid Panel No results for input(s): "CHOL", "HDL", "LDLCALC", "TRIG", "CHOLHDL", "LDLDIRECT" in the last 72 hours. Thyroid Function Tests No results for input(s): "TSH", "T4TOTAL", "T3FREE", "THYROIDAB" in the last 72 hours.  Invalid input(s): "FREET3"   Other results:   Imaging    DG CHEST PORT 1 VIEW Result Date: 04/28/2023 CLINICAL DATA:  Acute on chronic congestive heart failure. EXAM: PORTABLE CHEST 1 VIEW COMPARISON:  April 26, 2023. FINDINGS: Stable cardiomediastinal silhouette. Bilateral defibrillators  are noted. Stable mild bilateral pulmonary edema. Bony thorax is unremarkable. Minimal bilateral pleural effusions. IMPRESSION: Stable bilateral pulmonary edema with minimal bilateral pleural effusions. Electronically Signed   By: Lupita Raider M.D.   On: 04/28/2023 13:57       Medications:     Scheduled Medications:  acetaminophen  1,000 mg Oral TID   apixaban  5 mg Oral BID   atorvastatin  80 mg Oral Daily   calcium-vitamin D  1 tablet Oral BID WC   Chlorhexidine Gluconate Cloth  6 each Topical Daily   darbepoetin (ARANESP) injection - DIALYSIS  150 mcg Subcutaneous Q Fri-1800   feeding supplement  237 mL Oral TID BM   Gerhardt's butt cream   Topical BID   insulin aspart  0-15 Units Subcutaneous TID WC   insulin aspart  0-5 Units Subcutaneous QHS   leptospermum manuka honey  1 Application Topical Daily   multivitamin  1 tablet Oral BID   polyethylene glycol  17 g Oral Daily   senna-docusate  2 tablet Oral BID   sodium bicarbonate  650 mg Oral BID   sodium chloride flush  3 mL Intravenous Q12H    Infusions:   prismasol BGK 4/2.5 400 mL/hr at 04/04/2023 0124    prismasol BGK 4/2.5 400 mL/hr at 04/03/2023 0023   sodium chloride Stopped (04/27/23 0801)   amiodarone 30 mg/hr (04/30/2023 0800)   iron sucrose Stopped (04/28/23 1149)   milrinone 0.25 mcg/kg/min (04/04/2023 0800)   norepinephrine (LEVOPHED) Adult infusion 12 mcg/min (04/15/2023 0800)   piperacillin-tazobactam 3.375 g (04/09/2023 0814)   prismasol BGK 4/2.5 1,500 mL/hr at 04/25/2023 0507    PRN Medications: sodium chloride, acetaminophen, ALPRAZolam, heparin, hydrocortisone cream, magnesium hydroxide, menthol-cetylpyridinium, methocarbamol, ondansetron (ZOFRAN) IV, mouth rinse, oxyCODONE, saline, sodium chloride, sodium chloride, sodium chloride flush, zolpidem    Patient Profile   78 y.o. male with a PMH of COPD, CAD, CABG 2018, ischemic cardiomyopathy EF 20-25%, paroxysmal A-fib, hypertension, type 2 diabetes mellitus,  history of LV thrombus and multiple recent admissions for atrial fibrillation followed by subsequent retroperitoneal hematoma development, further c/b infection (Drainage of his hematoma with lacticaseibacillus casei, treated with a week of IV vancomycin that was stopped on 12/16). Readmitted on 12/21 for recurrent afib w/ RVR and a/c CHF w/ voulume overload and low-output, requiring inotropic support w/ milrinone.   Assessment/Plan   1. Acute on chronic systolic heart failure with biventricular involvement: Advanced disease, previously required milrinone for diuresis during his last admission.  Was discharged on midodrine without significant GDMT. Now readmitted w/ volume overload in setting of recurrent Afib w/ RVR  - Echo 11/24 EF 20-25% RV mild to moderately down - poor response to  high dose Lasix gtt despite empiric milrinone and NE support - suspect severe cardiorenal syndrome - now w/ anuric renal failure, worsening fluid overload and acute hypoxic respiratory failure.  He wants to trial HD => CVVH begun on 12/27.    - Suspect end-stage CHF and cardiorenal syndrome.  Frail with malnutrition.  Not candidate for advanced therapies or HD. Volume improved. Continues with high O2 requirement. Palliative Care consulted for GOC discussion d/t poor prognosis - Co-ox 72%. On milrinone 0.25 + 12 NE. MAP 50s, but SBP 100s (has wide pulse pressure with diastolic pressure 30s-40s) - CVP lower at 3. CVVH running at -75-100/hr. Will cut back to run even.  2. Atrial fibrillation with RVR: failed recent DCCV. Back in AFib on admit, suspect triggered by worsening volume overload and recent infection. s/p DCCV 12/23, back in NSR.   - Continue IV amiodarone while on milrinone, 30 mg/hr   - Eliquis 5 mg bid   3. CAD: CABG 2018 - stable  - No chest pain.  - apixaban, statin   4. 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  - Keep CO and MAP up with milrinone + NE.  - Trialing short-course of CVVHD to see if it can help. He understands he is not candidate for long-term HD.   - Volume better with CVP of 3. Run even today.   5. Diabetes: - SSI   6. Severe malnutrition - albumin 1.7 on admit   7. Iron-deficiency anemia - hgb has dropped  about 2 points over last week. No overt bleeding sites.  Hgb 7.2, transfuse < 7.  - Fe deficient, would be careful with Fe repletion with high WBCs.   8. ID - WBCs 37 => 29.5 => 28 => 43K, afebrile. Worsening oxygenation despite aggressive CVVH and lowering of CVP.  ?PNA.   - Cultures NGTD. - On empiric Zosyn (MRSA PCR negative so no vancomycin). - Had recent RP bleed US guided aspiration R hip 12/12, Cx with Lacticaseibacillus casei. Received several days of Vancomycin, organism resistant. Leukocytosis improved spontaneously - LUE swelling noted. ? DVT but already anticoagulated.  9. Pulmonary - Worsening respiratory status despite improvement in volume status.  Now on 90% HHFNC.  He is DNR/DNI.  Unable to tolerate BiPAP.   9. DNR/DNI - Poor prognosis, he is worsening.  Palliative Care has been consulted.    FINCH, LINDSAY N, PA-C  9:41 AM  Patient seen with PA, agree with the above note.   Oxygen saturation 100% this morning on heated high flow 40 L, 100% FiO2.  I/Os net negative 1912, CVP 3.  Co-ox 72.5% on milrinone 0.25 and NE 12. CVVH ongoing.   He does not feel good, having epistaxis.  Unna boots are bothering him.   General: NAD Neck: No JVD, no thyromegaly or thyroid nodule.  Lungs: Clear to auscultation bilaterally with normal respiratory effort. CV: Nondisplaced PMI.  Heart regular S1/S2, no S3/S4, no murmur.  1+ edema to knees.  Abdomen: Soft, nontender, no hepatosplenomegaly, no distention.  Skin: Intact without lesions or rashes.  Neurologic: Alert and oriented x 3.  Psych: Normal  affect. Extremities: No clubbing or cyanosis. Left arm swelling.  HEENT: Normal.   He remains in NSR on amiodarone and Eliquis.   CVVH ongoing, CVP now down to 3.  Still with significant peripheral edema.  Will cut back on CVVH, run net negative 50 cc/hr today.   Respiratory status remains poor, on 100% HHFNC.  No improvement with extensive volume removal via CVVH.   On empiric Zosyn for ?aspiration PNA.   End stage heart and renal disease, not candidate for long-term HD.  Pulmonary function not improving with volume removal, ?PNA.  I do not see a good path forward for him at this point.  I spoke with him today about the option of hospice/comfort care.  Will ask palliative care service to see him.   CRITICAL CARE Performed by: Marca Ancona  Total critical care time: 40 minutes  Critical care time was exclusive of separately billable procedures and treating other patients.  Critical care was necessary to treat or prevent imminent or life-threatening deterioration.  Critical care was time spent personally by me on the following activities: development of treatment plan with patient and/or surrogate as well as nursing, discussions with consultants, evaluation of patient's response to treatment, examination of patient, obtaining history from patient or surrogate, ordering and performing treatments and interventions, ordering and review of laboratory studies, ordering and review of radiographic studies, pulse oximetry and re-evaluation of patient's condition.  Marca Ancona 04/15/2023 10:59 AM

## 2023-05-01 NOTE — Progress Notes (Signed)
PROGRESS NOTE    [name] [name]  WUJ:[id number] DOB: 08/06/1945 DOA: 04/20/2023 PCP: [name], MD    Brief Narrative:   [name] [name] is a 78 y.o. male with past medical history significant for ischemic cardiomyopathy with LVEF 20-25%, paroxysmal atrial fibrillation, essential hypertension, CAD s/p CABG 2018, COPD, type 2 diabetes mellitus, history of LV thrombus who is readmitted to [facility] on 12/21 by cardiology service from acute inpatient rehabilitation for progressive shortness of breath, lower extremity edema.  Significant recent/Hospital events: 11/4: Admit with syncope/fall, atrial fibrillation cardioverted on 11/7, discharge 11/8 11/25: re-admit, right hip pain; noted retroperitoneal hematoma extending to distal psoas/iliac [place], volume overload.  Seen by nephrology/cardiology, started on midodrine, diuresed and discharged to CIR 12/9 on torsemide 12/10: Worsening leukocytosis at CIR, ID consulted, ultrasound-guided aspiration right hip 12/10 by IR; fluid culture with ACTICASEIBACILLUS CASEI, he was treated with few days of IV vancomycin which does not have efficacy against this, leukocytosis largely spontaneously started improving, vancomycin discontinued 12/16   12/21: Readmitted from CIR back to inpatient, 2H with A-fib RVR and CHF 12/21: heart failure team consulted, started on milrinone, Lasix dose increased 12/23: Cardioverted to sinus rhythm 12/24: Poor response to diuretics 12/27: Nephrology consulted; CVVD started 12/29: Respiratory status worsening despite CRRT, O2 now up to 40L HFNC; did not tolerated NIV; remains on milrinone/norepinephrine drip 12/30: Palliative care consulted for assistance with goals of care/medical decision making, overall poor prognosis   Assessment & Plan:   Acute on chronic systolic congestive heart failure; 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  Recently discharged on midodrine.  TTE 03/23/2024 with LVEF 20-25%.  Admitted by cardiology service and started on high-dose Lasix drip, milrinone drip and norepinephrine support.  Continued to be an anuric with worsening renal function and nephrology was consulted and patient started CRRT on 04/26/2023.  Not at candidate for advanced therapies or HD.  Continues with worsening respiratory status. 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  Etiology likely secondary to cardiorenal syndrome with low output cardiomyopathy as above.  Creatinine trended up to 5.8 on 04/26/2023 and nephrology was consulted to initiate CRRT. -- Nephrology/heart failure service following -- Sodium bicarbonate 600 mg p.o. twice daily -- Continue CRRT -- Continue support with milrinone and norepinephrine drip  Atrial fibrillation with RVR Patient underwent cardioversion by cardiology on 04/22/2023. -- Cardiology following as above -- Amiodarone drip -- Eliquis 5mg  p.o. twice daily  Acute hypoxic respiratory failure Concern for community-acquired pneumonia -- MRSA PCR negative -- WBC 16.0>>12.9>>37.4>29.5>27.9>43.5 -- Zosyn 3.375 g IV every 6 hours (started 12/29) -- Continue supplemental oxygen, maintain SpO2 greater than 92%, currently on 35L HFNC (intolerant to NIV)  Retroperitoneal hematoma distal psoas/iliacus Synthroid secondary to mechanical fall.  Developed profound leukocytosis and CIR with WBC count 40K underwent ultrasound-guided aspiration right hip with culture significant for  LACTICASEIBACILLUS CASEI, he was treated with few days of IV vancomycin which has no activity against this, seen by infectious disease, vancomycin discontinued 12/16   Type 2 diabetes mellitus Hemoglobin A1c 6.9.  On Ozempic at baseline.  Leukocytosis -- WBC 16.0>>12.9>>37.4>29.5>27.9>43.5 -- PCT 0.70>3.26>7.32 --  On IV Zosyn as above -- CBC, PCT daily  Severe hypoalbuminemia Nutrition Status: Nutrition Problem: Inadequate oral intake Etiology: acute illness, poor appetite, chronic illness Signs/Symptoms: meal completion < 50%, per patient/family report Interventions: Refer to RD note for recommendations -- Continue to encourage increased oral intake  Goals of care: Patient with severe cardiomyopathy complicated by A-fib with RVR, progressive renal failure, retroperitoneal hematoma and poor response to milrinone, high-dose Lasix and now on CRRT without significant approval of respiratory status.  Patient is not a candidate for long-term HD   DVT prophylaxis: Place TED hose Start: 04/22/23 1456 apixaban (ELIQUIS) tablet 5 mg    Code Status: Limited: Do not attempt resuscitation (DNR) -DNR-LIMITED -Do Not Intubate/DNI  Family Communication: Updated patient's son-in-law present at bedside this morning  Disposition Plan:  Level of care: ICU Status is: Inpatient Remains inpatient appropriate because: CRRT, norepinephrine drip, milrinone drip, overall poor prognosis    Consultants:  Cardiology/heart failure service Nephrology Palliative care  Procedures:  Cardioversion 12/23 Left IJ central line placement, 12/27 Right IJ HD catheter placement, 12/27  Antimicrobials:  Zosyn 12/29>>   Subjective: Patient seen examined bedside, lying in bed.  Son-in-law present.  Overall feels "poorly".  RN present at bedside.  Remains on milrinone/norepinephrine drip.  On CRRT.  Seen by cardiology and nephrology this morning, awaiting palliative care evaluation given overall poor prognosis  with recommendation of transitioning to comfort measures/hospice.  Continues on high flow nasal cannula at 40L/min; intolerant of NIV overnight.  Remains on IV antibiotics with uptrending WBC count.  Remains afebrile.  Denies headache, no visual changes, no chest pain, no abdominal pain, no dizziness, no fever/chills, no nausea cefonicid diarrhea.  No other acute concerns overnight or this morning per nursing staff.  Objective: Vitals:   04/08/2023 1000 04/04/2023 1100 04/17/2023 1133 04/08/2023 1200  BP: (!) 109/39 (!) 96/32  (!) 92/56  Pulse: 86 87  84  Resp: (!) 23 (!) 25  (!) 23  Temp:   97.8 F (36.6 C)   TempSrc:   Oral   SpO2: 92% 100%  96%  Weight:      Height:        Intake/Output Summary (Last 24 hours) at 04/04/2023 1209 Last data filed at 04/16/2023 1200 Gross per 24 hour  Intake 1848.33 ml  Output 3138.1 ml  Net -1289.77 ml   Filed Weights   04/27/23 0500 04/28/23 0500 04/30/2023 0500  Weight: 97.3 kg 96.6 kg 91.1 kg    Examination:  Physical Exam: GEN: NAD, alert and oriented x 3, chronically ill appearance, appears older than stated age HEENT: NCAT, PERRL, EOMI, sclera clear, MMM, noted L IJ CVC, right IJ HD catheter PULM: Breath sounds diminished bilateral bases with bibasilar crackles, no wheezing, normal respiratory effort without accessory muscle use, on 4 L high flow nasal cannula with SpO2 100% at rest CV: RRR w/o M/G/R GI: abd soft, NTND, NABS, no R/G/M MSK: Trace lower extremity peripheral edema, LUE edema; moves all extremities independently  NEURO: No focal neurological deficits PSYCH: Depressed mood, flat affect Integumentary: Several areas of ecchymosis to bilateral upper/lower extremities in various stages of healing, no other concerning rashes/lesions/wounds noted on exposed skin surfaces    Data Reviewed: I have personally reviewed following labs and imaging studies  CBC: Recent Labs  Lab 04/25/23 0515 04/25/23 0605 04/26/23 0529 04/26/23 1140  04/27/23 0358 04/28/23 0325 04/14/2023 0456  WBC 18.7*  --  37.4*  --  29.5* 27.9* 43.5*  NEUTROABS  --   --   --   --   --  25.4* 40.5*  HGB 8.2*   < > 7.7* 7.4* 7.5* 7.3* 7.2*  HCT 24.9*   < > 22.9* 22.3* 22.5* 22.2* 21.8*  MCV 78.8*  --  76.8*  --  76.8* 77.4* 77.6*  PLT 632*  --  564*  --  542* 480* 404*   < > = values in this interval not displayed.   Basic Metabolic Panel: Recent Labs  Lab 04/25/23 0515 04/25/23 0605 04/26/23 0529 04/26/23 1536 04/27/23 0358 04/27/23 1742 04/28/23 0325 04/28/23 1515 04/28/2023 0456  NA 125*   < > 122*   < > 128* 129* 131* 131* 134*  K 4.3   < > 4.7   < > 4.3 4.2 4.1 4.0 4.1  CL 86*  --  83*   < > 94* 95* 98 98 100  CO2 26  --  24   < > 23 24 24 24 25   GLUCOSE 147*  --  130*   < > 134* 172* 151* 57* 118*  BUN 61*  --  70*   < > 36* 25* 18 14 11   CREATININE 3.92*  --  5.18*   < > 2.81* 2.04* 1.73* 1.46* 1.34*  CALCIUM 8.0*  --  7.7*   < > 7.7* 7.7* 7.6* 7.7* 7.5*  MG 2.0  --  2.0  --  2.1  --  2.2  --  2.3  PHOS  --   --   --    < > 3.4 2.2* 2.9 1.4* 3.4   < > = values in this interval not displayed.   GFR: Estimated Creatinine Clearance: 53.7 mL/min (A) (by C-G formula based on SCr of 1.34 mg/dL (H)). Liver Function Tests: Recent Labs  Lab 04/27/23 0358 04/27/23 1742 04/28/23 0325 04/28/23 1515 04/21/2023 0456  AST  --   --   --   --  124*  ALT  --   --   --   --  51*  ALKPHOS  --   --   --   --  383*  BILITOT  --   --   --   --  1.8*  PROT  --   --   --   --  5.0*  ALBUMIN 1.6* 1.5* 1.5* <1.5* <1.5*   No results for input(s): "LIPASE", "AMYLASE" in the last 168 hours. No results for input(s): "AMMONIA" in the last 168 hours. Coagulation Profile: No results for input(s): "INR", "PROTIME" in the last 168 hours. Cardiac Enzymes: No results for input(s): "CKTOTAL", "CKMB", "CKMBINDEX", "TROPONINI" in the last 168 hours. BNP (last 3 results) Recent Labs    09/25/22 0953  PROBNP 2,187*   HbA1C: No results for input(s):  "HGBA1C" in the last 72 hours. CBG: Recent Labs  Lab 04/28/23 1238 04/28/23 1743 04/28/23 2116 04/01/2023 0614 04/03/2023 1137  GLUCAP 136* 76 86 125* 171*   Lipid Profile: No results for input(s): "CHOL", "HDL", "LDLCALC", "TRIG", "CHOLHDL", "LDLDIRECT" in the last 72 hours. Thyroid Function Tests: No results for input(s): "TSH", "T4TOTAL", "FREET4", "T3FREE", "THYROIDAB" in the last 72 hours. Anemia Panel: Recent Labs    04/27/23 0358  FERRITIN 203  TIBC 171*  IRON 5*   Sepsis Labs: Recent Labs  Lab 04/22/23 2216 04/23/23 0157 04/28/23 0325 04/03/2023 0456  PROCALCITON  --   --  3.26 7.32  LATICACIDVEN 2.1* 1.5  --   --     Recent Results (from the past 240 hours)  MRSA Next Gen by PCR, Nasal  Status: None   Collection Time: 04/20/23 10:29 AM   Specimen: Nasal Mucosa; Nasal Swab  Result Value Ref Range Status   MRSA by PCR Next Gen NOT DETECTED NOT DETECTED Final    Comment: (NOTE) The GeneXpert MRSA Assay (FDA approved for NASAL specimens only), is one component of a comprehensive MRSA colonization surveillance program. It is not intended to diagnose MRSA infection nor to guide or monitor treatment for MRSA infections. Test performance is not FDA approved in patients less than 49 years old. Performed at [facility], [address], [place], [place] [address]   Culture, blood (Routine X 2) w Reflex to ID Panel     Status: None (Preliminary result)   Collection Time: 04/27/23  9:27 AM   Specimen: BLOOD RIGHT ARM  Result Value Ref Range Status   Specimen Description BLOOD RIGHT ARM  Final   Special Requests   Final    BOTTLES DRAWN AEROBIC AND ANAEROBIC Blood Culture results may not be optimal due to an inadequate volume of blood received in culture bottles   Culture   Final    NO GROWTH < 24 HOURS Performed at [facility], [address] [place], [place] [address]    Report Status PENDING  Incomplete  Culture, blood (Routine X 2) w Reflex to  ID Panel     Status: None (Preliminary result)   Collection Time: 04/27/23  9:29 AM   Specimen: BLOOD LEFT ARM  Result Value Ref Range Status   Specimen Description BLOOD LEFT ARM  Final   Special Requests   Final    BOTTLES DRAWN AEROBIC AND ANAEROBIC Blood Culture results may not be optimal due to an inadequate volume of blood received in culture bottles   Culture   Final    NO GROWTH < 24 HOURS Performed at [facility], [address], [place], [place] [address]    Report Status PENDING  Incomplete         Radiology Studies: DG CHEST PORT 1 VIEW Result Date: 04/28/2023 CLINICAL DATA:  Acute on chronic congestive heart failure. EXAM: PORTABLE CHEST 1 VIEW COMPARISON:  April 26, 2023. FINDINGS: Stable cardiomediastinal silhouette. Bilateral defibrillators are noted. Stable mild bilateral pulmonary edema. Bony thorax is unremarkable. Minimal bilateral pleural effusions. IMPRESSION: Stable bilateral pulmonary edema with minimal bilateral pleural effusions. Electronically Signed   By: Lupita Raider M.D.   On: 04/28/2023 13:57        Scheduled Meds:  acetaminophen  1,000 mg Oral TID   apixaban  5 mg Oral BID   atorvastatin  80 mg Oral Daily   calcium-vitamin D  1 tablet Oral BID WC   Chlorhexidine Gluconate Cloth  6 each Topical Daily   darbepoetin (ARANESP) injection - DIALYSIS  150 mcg Subcutaneous Q Fri-1800   feeding supplement  237 mL Oral TID BM   Gerhardt's butt cream   Topical BID   insulin aspart  0-15 Units Subcutaneous TID WC   insulin aspart  0-5 Units Subcutaneous QHS   leptospermum manuka honey  1 Application Topical Daily   multivitamin  1 tablet Oral BID   polyethylene glycol  17 g Oral Daily   senna-docusate  2 tablet Oral BID   sodium bicarbonate  650 mg Oral BID   sodium chloride flush  3 mL Intravenous Q12H   Continuous Infusions:   prismasol BGK 4/2.5 400 mL/hr at 04/14/2023 0124    prismasol BGK 4/2.5 400 mL/hr at 04/06/2023  0023   sodium  chloride Stopped (04/27/23 0801)   amiodarone 30 mg/hr (04/08/2023 1200)   iron sucrose Stopped (04/17/2023 1151)   milrinone 0.25 mcg/kg/min (04/02/2023 1200)   norepinephrine (LEVOPHED) Adult infusion 12 mcg/min (04/17/2023 1200)   piperacillin-tazobactam Stopped (04/01/2023 0844)   prismasol BGK 4/2.5 1,500 mL/hr at 04/24/2023 0507     LOS: 9 days    Critical Care Time Upon my evaluation, this patient had a high probability of imminent or life-threatening deterioration due to acute on chronic systolic congestive heart failure, atrial fibrillation with RVR, acute renal failure on CKD stage IIIb/IV requiring CRRT, acute hypoxic respiratory failure on high flow nasal cannula, which required my direct attention, intervention, and personal management.  I have personally provided 45 minutes of critical care time exclusive of my time spent on separately billable procedures.  Time includes review of laboratory data, radiology results, discussion with consultants, and monitoring for potential decompensation.       Alvira Philips [place], DO [facility] Available via Epic secure chat 7am-7pm After these hours, please refer to coverage provider listed on [email or web address] 04/02/2023, 12:09 PM

## 2023-05-01 NOTE — Progress Notes (Signed)
[facility] Progress Note   Assessment/ Plan:   78 year old M with cardiomyopathy and instability requiring much inpatient care of late.  Most recently a step back with Afib-  medical care with milrinone and cardioversion was attempted but unfortunately is developing worsening renal function  1.Renal- as above very poor cardiac status-  unstable and in hospital a lot of late.   Most recently has had setback with hypotension and Afib which despite medical management has led to worsening renal function and worsening volume overload.   - started CRRT 04/26/23, day 4 of trial of 5-6 days - continue UF as tolerated, looks like he still has a lot of dependent edema- albumin is < 1.5 2. Hypertension/volume  - volume overloaded but also third spacing-  trying to achieve negative volume status with CRRT 3. Hyponatremia-  due to volume overload-  should correct with CRRT-  getting better 4. Anemia  - now hgb under 8-  is not helping third spacing-  will check iron ( is low, will replete) and give ESA- darbe 150 q week  5. Elytes-  repleted phos 12/27  Subjective:    Continues on CRRT, says that his legs are hurting.  WBC ct up.     Objective:   BP (!) 110/45 (BP Location: Left Arm)   Pulse 91   Temp 97.8 F (36.6 C) (Oral)   Resp 20   Ht 6\' 2"  (1.88 m)   Wt 91.1 kg   SpO2 97%   BMI 25.79 kg/m   Physical Exam: UEA:[id number] ill-appearing, on HFNC CVS: RRR Resp: poor air movement throughout Abd: distended Ext: UNNA boots, wrapped, some dependent thigh edema  Labs: BMET Recent Labs  Lab 04/26/23 0529 04/26/23 1536 04/27/23 0358 04/27/23 1742 04/28/23 0325 04/28/23 1515 04/01/2023 0456  NA 122* 124* 128* 129* 131* 131* 134*  K 4.7 4.5 4.3 4.2 4.1 4.0 4.1  CL 83* 87* 94* 95* 98 98 100  CO2 24 26 23 24 24 24 25   GLUCOSE 130* 163* 134* 172* 151* 57* 118*  BUN 70* 58* 36* 25* 18 14 11   CREATININE 5.18* 4.38* 2.81* 2.04* 1.73* 1.46* 1.34*  CALCIUM 7.7* 7.7* 7.7* 7.7*  7.6* 7.7* 7.5*  PHOS  --  5.6* 3.4 2.2* 2.9 1.4* 3.4   CBC Recent Labs  Lab 04/26/23 0529 04/26/23 1140 04/27/23 0358 04/28/23 0325 04/19/2023 0456  WBC 37.4*  --  29.5* 27.9* 43.5*  NEUTROABS  --   --   --  25.4* 40.5*  HGB 7.7* 7.4* 7.5* 7.3* 7.2*  HCT 22.9* 22.3* 22.5* 22.2* 21.8*  MCV 76.8*  --  76.8* 77.4* 77.6*  PLT 564*  --  542* 480* 404*      Medications:     acetaminophen  1,000 mg Oral TID   apixaban  5 mg Oral BID   atorvastatin  80 mg Oral Daily   calcium-vitamin D  1 tablet Oral BID WC   Chlorhexidine Gluconate Cloth  6 each Topical Daily   darbepoetin (ARANESP) injection - DIALYSIS  150 mcg Subcutaneous Q Fri-1800   feeding supplement  237 mL Oral TID BM   Gerhardt's butt cream   Topical BID   insulin aspart  0-15 Units Subcutaneous TID WC   insulin aspart  0-5 Units Subcutaneous QHS   leptospermum manuka honey  1 Application Topical Daily   multivitamin  1 tablet Oral BID   polyethylene glycol  17 g Oral Daily   senna-docusate  2 tablet  Oral BID   sodium bicarbonate  650 mg Oral BID   sodium chloride flush  3 mL Intravenous Q12H    Bufford Buttner, MD 04/24/2023, 10:42 AM

## 2023-05-01 NOTE — Progress Notes (Signed)
Nutrition Brief Note  Chart reviewed. Pt now transitioning to comfort care.  No further nutrition interventions planned at this time.  Please re-consult as needed.   Romelle Starcher MS, RDN, LDN, CNSC Registered Dietitian 3 Clinical Nutrition RD Inpatient Contact Info in Amion

## 2023-05-01 NOTE — Progress Notes (Signed)
Physical Therapy Treatment Patient Details Name: [name] [name] MRN: [id number] DOB: 1945-11-15 Today's Date: 04/04/2023   History of Present Illness 78 y.o. male admitted 04/20/23 from [facility] with Afib with RVR and decompensated HF. 12/23 DCCV. 12/27 CRRT started. Continued increase in O2 demand. PMHx: Admit 03/25/2023-12/9 with Rt hip pain, retroperitoneal hematoma. Admit 11/5-11/8 s/p fall with Rt clavicle fx. AICD, bladder CA, CHF, CAD, T2DM, PAF, PNA.    PT Comments  Pt eager to mobilize with PT/OT this date however is continuing to decline functionally as his medical status is continuing to decline as well. Pt mod/maxA for transfer to EOB, tolerate EOB for 12 min however requiring posterior support and was unable to keep head up. Pt c/o fatigue. Pt very deconditioned, noted edema t/o body, and poor activity tolerance. Acute PT to cont to follow.    If plan is discharge home, recommend the following: Assist for transportation;Help with stairs or ramp for entrance;A lot of help with walking and/or transfers;Assistance with cooking/housework;A lot of help with bathing/dressing/bathroom   Can travel by private vehicle        Equipment Recommendations  None recommended by PT    Recommendations for Other Services Rehab consult;OT consult     Precautions / Restrictions Precautions Precautions: Fall;Other (comment) Precaution Comments: SPO2 and HR;p HHFNC- 100% FiO2 and 40Lo2, CRRT Restrictions Weight Bearing Restrictions Per Provider Order: No RUE Weight Bearing Per Provider Order: Weight bearing as tolerated Other Position/Activity Restrictions: RUE WBAT allowed per Dr Renford Dills 12/2 10:47 AM     Mobility  Bed Mobility Overal bed mobility: Needs Assistance Bed Mobility: Supine to Sit, Sit to Supine, Rolling Rolling: Max assist   Supine to sit: Mod assist, +2 for safety/equipment, HOB elevated Sit to supine: Mod assist, +2 for safety/equipment   General bed mobility comments:  step by step cues for initiation of bed mobility and signficiant assisst for truncal elevation. minimal assist with LE management    Transfers                   General transfer comment: deferred this session secondary to decr activity tolerance and poor sitting balance and tenuous respiratory status    Ambulation/Gait                   Stairs             Wheelchair Mobility     Tilt Bed    Modified Rankin (Stroke Patients Only)       Balance Overall balance assessment: Needs assistance, History of Falls Sitting-balance support: Feet supported, Bilateral upper extremity supported Sitting balance-Leahy Scale: Poor Sitting balance - Comments: needing external posterior assist for sitting balance throughout and cues for posture, unable to maintain neck extension, progressive increased trunk flexion with progressive fatigue. Pt sat EOB x 12 min Postural control: Posterior lean     Standing balance comment: Rw in standing and physical assist                            Cognition Arousal: Alert Behavior During Therapy: WFL for tasks assessed/performed Overall Cognitive Status: Impaired/Different from baseline Area of Impairment: Attention, Following commands, Problem solving, Safety/judgement                   Current Attention Level: Focused, Sustained Memory: Decreased short-term memory Following Commands: Follows one step commands consistently Safety/Judgement: Decreased awareness of deficits   Problem Solving: Requires verbal cues,  Requires tactile cues General Comments: step by step cues for bed mobility, intermittent cues for quality of movement over quantity with exercises. limited most by fatigue        Exercises General Exercises - Lower Extremity Ankle Circles/Pumps: PROM, AAROM, Both, 10 reps, Supine Quad Sets: AAROM, Both, 5 reps, Supine Gluteal Sets: AROM, Both, 5 reps, Supine Long Arc Quad: AAROM, Both, 5 reps,  Seated (less than 1/2 range actively) Hip Flexion/Marching: AAROM, Both, 5 reps, Seated    General Comments General comments (skin integrity, edema, etc.): HHFNC 100% FIO2, 40L 02      Pertinent Vitals/Pain Pain Assessment Pain Assessment: Faces Faces Pain Scale: Hurts little more Pain Location: BLE Pain Descriptors / Indicators: Aching, Sore, Tender Pain Intervention(s): Monitored during session    Home Living Family/patient expects to be discharged to:: Inpatient rehab Living Arrangements: Spouse/significant other Available Help at Discharge: Family;Available 24 hours/day Type of Home: House Home Access: Stairs to enter Entrance Stairs-Rails: None Entrance Stairs-Number of Steps: 2 Alternate Level Stairs-Number of Steps: flight, stair lift to be put in mid Jan Home Layout: Multi-level;Bed/bath upstairs (half bath down stairs) Home Equipment: Agricultural consultant (2 wheels);Cane - single point;Shower seat Additional Comments: stair lift being installed    Prior Function            PT Goals (current goals can now be found in the care plan section) Acute Rehab PT Goals Patient Stated Goal: return to [facility] prior to home PT Goal Formulation: With patient/family Time For Goal Achievement: 05/07/23 Potential to Achieve Goals: Good Progress towards PT goals: Progressing toward goals    Frequency    Min 1X/week      PT Plan      Co-evaluation PT/OT/SLP Co-Evaluation/Treatment: Yes Reason for Co-Treatment: Complexity of the patient's impairments (multi-system involvement);For patient/therapist safety;To address functional/ADL transfers PT goals addressed during session: Mobility/safety with mobility;Balance        AM-PAC PT "6 Clicks" Mobility   Outcome Measure  Help needed turning from your back to your side while in a flat bed without using bedrails?: A Little Help needed moving from lying on your back to sitting on the side of a flat bed without using bedrails?: A  Lot Help needed moving to and from a bed to a chair (including a wheelchair)?: A Lot Help needed standing up from a chair using your arms (e.g., wheelchair or bedside chair)?: A Lot Help needed to walk in hospital room?: Total Help needed climbing 3-5 steps with a railing? : Total 6 Click Score: 11    End of Session Equipment Utilized During Treatment: Gait belt;Oxygen Activity Tolerance: Patient limited by fatigue Patient left: with call bell/phone within reach;with nursing/sitter in room;in bed Nurse Communication: Mobility status (pt seeping from L forearm) PT Visit Diagnosis: Unsteadiness on feet (R26.81);Repeated falls (R29.6);Other abnormalities of gait and mobility (R26.89)     Time: 0922-1000 PT Time Calculation (min) (ACUTE ONLY): 38 min  Charges:    $Therapeutic Exercise: 8-22 mins $Therapeutic Activity: 8-22 mins PT General Charges $$ ACUTE PT VISIT: 1 Visit                     Lewis Shock, PT, DPT Acute Rehabilitation Services Secure chat preferred Office #: [phone]    Iona Hansen 04/16/2023, 2:04 PM

## 2023-05-01 NOTE — Evaluation (Signed)
Occupational Therapy Evaluation Patient Details Name: [name] [name] MRN: [id number] DOB: 1945/10/14 Today's Date: 04/11/2023   History of Present Illness 78 y.o. male admitted 04/20/23 from [facility] with Afib with RVR and decompensated HF. 12/23 DCCV. 12/27 CRRT started. Continued increase in O2 demand. PMHx: Admit 03/25/2023-12/9 with Rt hip pain, retroperitoneal hematoma. Admit 11/5-11/8 s/p fall with Rt clavicle fx. AICD, bladder CA, CHF, CAD, T2DM, PAF, PNA.   Clinical Impression   Pt recently at [facility] but complicated by afib with RVR see above. Upon eval, pt presents with deconditioning, increased oxygen demand, decreased balance, strength, and safety. Needing up to mod A for UB ADL and Max A for LB ADL. Pt unable to tolerate transfers this session and needing BUE and external support for sitting balance. Will continue to follow acutely. Recommending intensive multidisciplinary rehabilitation >3 hours/day to optimize safety and independence in ADL.        If plan is discharge home, recommend the following: A lot of help with walking and/or transfers;Two people to help with bathing/dressing/bathroom    Functional Status Assessment  Patient has had a recent decline in their functional status and demonstrates the ability to make significant improvements in function in a reasonable and predictable amount of time.  Equipment Recommendations  Other (comment) (defer)    Recommendations for Other Services Rehab consult     Precautions / Restrictions Precautions Precautions: Fall;Other (comment) Precaution Comments: SPO2 and HR;p HHFNC, CRRT Restrictions Weight Bearing Restrictions Per Provider Order: No RUE Weight Bearing Per Provider Order: Weight bearing as tolerated      Mobility Bed Mobility Overal bed mobility: Needs Assistance Bed Mobility: Supine to Sit, Sit to Supine, Rolling Rolling: Max assist   Supine to sit: Mod assist, +2 for safety/equipment Sit to supine: Mod assist,  +2 for safety/equipment   General bed mobility comments: step by step cues for initiation of bed mobility and signficiant assisst for truncal elevation.    Transfers                   General transfer comment: deferred this session secondary to decr activity tolerance and poor sitting balance.      Balance Overall balance assessment: Needs assistance, History of Falls Sitting-balance support: Feet supported, Bilateral upper extremity supported Sitting balance-Leahy Scale: Poor Sitting balance - Comments: needing external asisst for sitting balance throughout and cues for posture                                   ADL either performed or assessed with clinical judgement   ADL Overall ADL's : Needs assistance/impaired     Grooming: Minimal assistance;Sitting   Upper Body Bathing: Sitting;Moderate assistance   Lower Body Bathing: Maximal assistance;Sitting/lateral leans   Upper Body Dressing : Sitting;Moderate assistance   Lower Body Dressing: Maximal assistance;Sitting/lateral leans     Toilet Transfer Details (indicate cue type and reason): deferred                 Vision Baseline Vision/History: 1 Wears glasses Ability to See in Adequate Light: 0 Adequate Patient Visual Report: No change from baseline       Perception Perception: Within Functional Limits       Praxis Praxis: WFL       Pertinent Vitals/Pain Pain Assessment Pain Assessment: Faces Faces Pain Scale: Hurts little more Pain Location: BLE Pain Descriptors / Indicators: Aching, Sore, Tender Pain Intervention(s): Limited activity  within patient's tolerance, Monitored during session     Extremity/Trunk Assessment Upper Extremity Assessment Upper Extremity Assessment: Generalized weakness;LUE deficits/detail;RUE deficits/detail RUE Deficits / Details: clavicle fx per chart LUE Deficits / Details: weepy drainage with min edema predominantly at elbow area; pt reports pain  in hand   Lower Extremity Assessment Lower Extremity Assessment: Defer to PT evaluation   Cervical / Trunk Assessment Cervical / Trunk Assessment: Kyphotic   Communication Communication Communication: No apparent difficulties Cueing Techniques: Verbal cues;Gestural cues;Tactile cues   Cognition Arousal: Alert Behavior During Therapy: Flat affect Overall Cognitive Status: Impaired/Different from baseline Area of Impairment: Attention, Following commands, Problem solving, Safety/judgement                   Current Attention Level: Focused, Sustained Memory: Decreased short-term memory Following Commands: Follows one step commands consistently Safety/Judgement: Decreased awareness of deficits   Problem Solving: Requires verbal cues, Requires tactile cues General Comments: step by step cues for bed mobility, intermittent cues for quality of movement over quantity with exercises. limited most by fatigue     General Comments  HHFNC 40 L at 100% FiO2; desatting as low as 87% at EOB and supine    Exercises Other Exercises Other Exercises: seated reaching with BUE 3x ea UE Other Exercises: static sitting emphasis on upright posture/core activation   Shoulder Instructions      Home Living Family/patient expects to be discharged to:: Inpatient rehab Living Arrangements: Spouse/significant other Available Help at Discharge: Family;Available 24 hours/day Type of Home: House Home Access: Stairs to enter [employer] of Steps: 2 Entrance Stairs-Rails: None Home Layout: Multi-level;Bed/bath upstairs (half bath down stairs) Alternate Level Stairs-Number of Steps: flight, stair lift to be put in mid Jan   Bathroom Shower/Tub: Walk-in shower   Bathroom Toilet: Standard   How Accessible: Accessible via walker Home Equipment: Agricultural consultant (2 wheels);Cane - single point;Shower seat   Additional Comments: stair lift being installed  Lives With: Spouse    Prior  Functioning/Environment Prior Level of Function : History of Falls (last six months);Needs assist             Mobility Comments: on [facility] was walking with RW and assist, prior to november walking with RW with falls ADLs Comments: assist for ADLs since admission, independent prior        OT Problem List: Decreased range of motion;Decreased strength;Impaired balance (sitting and/or standing);Pain      OT Treatment/Interventions: Self-care/ADL training;Therapeutic activities;Balance training;DME and/or AE instruction;Patient/family education    OT Goals(Current goals can be found in the care plan section) Acute Rehab OT Goals Patient Stated Goal: do therapy OT Goal Formulation: With patient Time For Goal Achievement: 05/13/23 Potential to Achieve Goals: Good  OT Frequency: Min 1X/week    Co-evaluation              AM-PAC OT "6 Clicks" Daily Activity     Outcome Measure Help from another person eating meals?: A Little Help from another person taking care of personal grooming?: A Little Help from another person toileting, which includes using toliet, bedpan, or urinal?: A Lot Help from another person bathing (including washing, rinsing, drying)?: A Lot Help from another person to put on and taking off regular upper body clothing?: A Lot Help from another person to put on and taking off regular lower body clothing?: A Lot 6 Click Score: 14   End of Session Equipment Utilized During Treatment: Oxygen (HHFNC) Nurse Communication: Mobility status;Other (comment) (skin tears on  bottom observed with rolling at end of session. RN present)  Activity Tolerance: Patient tolerated treatment well Patient left: in bed;with call bell/phone within reach;with bed alarm set  OT Visit Diagnosis: Unsteadiness on feet (R26.81);History of falling (Z91.81);Pain                Time: 0922-1000 OT Time Calculation (min): 38 min Charges:  OT General Charges $OT Visit: 1 Visit OT Evaluation $OT  Eval Moderate Complexity: 1 Mod OT Treatments $Self Care/Home Management : 8-22 mins  Tyler Deis, OTR/L [facility] Acute Rehabilitation Office: [phone]   Myrla Halsted 04/05/2023, 12:00 PM

## 2023-05-01 DEATH — deceased

## 2023-05-02 LAB — CULTURE, BLOOD (ROUTINE X 2)
Culture: NO GROWTH
Culture: NO GROWTH

## 2023-07-01 ENCOUNTER — Telehealth: Payer: Self-pay

## 2023-07-01 NOTE — Telephone Encounter (Signed)
 Spoke w/ patients wife. I offered to send a return kit for MDT monitor but she insisted on coming to the office to drop it off. I told her she has our deepest condolences for her loss.
